# Patient Record
Sex: Male | Born: 1950
Health system: Southern US, Community
[De-identification: ages and names within clinical notes are randomized; demographics above are authoritative.]

## PROBLEM LIST (undated history)

## (undated) ENCOUNTER — Ambulatory Visit: Admission: EM | Payer: PPO | Source: Home / Self Care

## (undated) DIAGNOSIS — R319 Hematuria, unspecified: Secondary | ICD-10-CM

## (undated) DIAGNOSIS — Z87442 Personal history of urinary calculi: Secondary | ICD-10-CM

## (undated) DIAGNOSIS — H539 Unspecified visual disturbance: Secondary | ICD-10-CM

## (undated) DIAGNOSIS — I8290 Acute embolism and thrombosis of unspecified vein: Secondary | ICD-10-CM

## (undated) DIAGNOSIS — R351 Nocturia: Secondary | ICD-10-CM

## (undated) DIAGNOSIS — I219 Acute myocardial infarction, unspecified: Secondary | ICD-10-CM

## (undated) DIAGNOSIS — I10 Essential (primary) hypertension: Secondary | ICD-10-CM

## (undated) DIAGNOSIS — I714 Abdominal aortic aneurysm, without rupture, unspecified: Secondary | ICD-10-CM

## (undated) DIAGNOSIS — E119 Type 2 diabetes mellitus without complications: Secondary | ICD-10-CM

## (undated) DIAGNOSIS — Z951 Presence of aortocoronary bypass graft: Secondary | ICD-10-CM

## (undated) DIAGNOSIS — C61 Malignant neoplasm of prostate: Secondary | ICD-10-CM

## (undated) DIAGNOSIS — R3911 Hesitancy of micturition: Secondary | ICD-10-CM

## (undated) DIAGNOSIS — I872 Venous insufficiency (chronic) (peripheral): Secondary | ICD-10-CM

## (undated) DIAGNOSIS — T148XXA Other injury of unspecified body region, initial encounter: Secondary | ICD-10-CM

## (undated) DIAGNOSIS — E785 Hyperlipidemia, unspecified: Secondary | ICD-10-CM

## (undated) DIAGNOSIS — I2510 Atherosclerotic heart disease of native coronary artery without angina pectoris: Secondary | ICD-10-CM

## (undated) DIAGNOSIS — M1990 Unspecified osteoarthritis, unspecified site: Secondary | ICD-10-CM

## (undated) DIAGNOSIS — R739 Hyperglycemia, unspecified: Secondary | ICD-10-CM

## (undated) DIAGNOSIS — G629 Polyneuropathy, unspecified: Secondary | ICD-10-CM

## (undated) DIAGNOSIS — N189 Chronic kidney disease, unspecified: Secondary | ICD-10-CM

## (undated) DIAGNOSIS — E782 Mixed hyperlipidemia: Secondary | ICD-10-CM

## (undated) DIAGNOSIS — I639 Cerebral infarction, unspecified: Secondary | ICD-10-CM

## (undated) DIAGNOSIS — I213 ST elevation (STEMI) myocardial infarction of unspecified site: Secondary | ICD-10-CM

## (undated) HISTORY — DX: Acute embolism and thrombosis of unspecified vein: I82.90

## (undated) HISTORY — PX: URETERAL STENT PLACEMENT: SHX822

## (undated) HISTORY — DX: Cerebral infarction, unspecified: I63.9

## (undated) HISTORY — PX: KNEE SURGERY: SHX244

## (undated) HISTORY — DX: Polyneuropathy, unspecified: G62.9

## (undated) HISTORY — DX: Unspecified visual disturbance: H53.9

## (undated) HISTORY — DX: Type 2 diabetes mellitus without complications: E11.9

## (undated) HISTORY — DX: Chronic kidney disease, unspecified: N18.9

## (undated) HISTORY — PX: CORONARY ARTERY BYPASS GRAFT: SHX141

## (undated) HISTORY — PX: CATARACT EXTRACTION: SUR2

## (undated) HISTORY — DX: Malignant neoplasm of prostate: C61

## (undated) HISTORY — DX: Essential (primary) hypertension: I10

## (undated) HISTORY — DX: Unspecified osteoarthritis, unspecified site: M19.90

---

## 1980-12-25 HISTORY — PX: SHOULDER SURGERY: SHX246

## 1998-08-05 ENCOUNTER — Observation Stay (HOSPITAL_COMMUNITY): Admission: AD | Admit: 1998-08-05 | Discharge: 1998-08-06 | Payer: Self-pay | Admitting: Cardiology

## 2008-02-10 ENCOUNTER — Inpatient Hospital Stay (HOSPITAL_COMMUNITY): Admission: EM | Admit: 2008-02-10 | Discharge: 2008-02-16 | Payer: Self-pay | Admitting: Emergency Medicine

## 2008-02-10 DIAGNOSIS — I213 ST elevation (STEMI) myocardial infarction of unspecified site: Secondary | ICD-10-CM

## 2008-02-10 HISTORY — DX: ST elevation (STEMI) myocardial infarction of unspecified site: I21.3

## 2008-02-11 ENCOUNTER — Ambulatory Visit: Payer: Self-pay | Admitting: Thoracic Surgery (Cardiothoracic Vascular Surgery)

## 2008-03-16 ENCOUNTER — Ambulatory Visit (HOSPITAL_COMMUNITY)
Admission: RE | Admit: 2008-03-16 | Discharge: 2008-03-16 | Payer: Self-pay | Admitting: Thoracic Surgery (Cardiothoracic Vascular Surgery)

## 2008-03-16 ENCOUNTER — Ambulatory Visit: Payer: Self-pay | Admitting: Thoracic Surgery (Cardiothoracic Vascular Surgery)

## 2010-03-07 ENCOUNTER — Encounter: Admission: AD | Admit: 2010-03-07 | Discharge: 2010-03-07 | Payer: Self-pay | Admitting: Dentistry

## 2010-03-07 ENCOUNTER — Ambulatory Visit: Payer: Self-pay | Admitting: Dentistry

## 2010-03-15 HISTORY — PX: CARDIOVASCULAR STRESS TEST: SHX262

## 2010-03-24 ENCOUNTER — Ambulatory Visit (HOSPITAL_COMMUNITY): Admission: RE | Admit: 2010-03-24 | Discharge: 2010-03-24 | Payer: Self-pay | Admitting: Dentistry

## 2010-03-24 HISTORY — PX: OTHER SURGICAL HISTORY: SHX169

## 2010-05-02 ENCOUNTER — Ambulatory Visit: Payer: Self-pay | Admitting: Dentistry

## 2010-07-11 ENCOUNTER — Ambulatory Visit: Payer: Self-pay | Admitting: Dentistry

## 2010-08-10 ENCOUNTER — Emergency Department (HOSPITAL_COMMUNITY): Admission: EM | Admit: 2010-08-10 | Discharge: 2010-08-10 | Payer: Self-pay | Admitting: Family Medicine

## 2010-09-05 ENCOUNTER — Ambulatory Visit: Payer: Self-pay | Admitting: Dentistry

## 2011-01-15 ENCOUNTER — Encounter: Payer: Self-pay | Admitting: Thoracic Surgery (Cardiothoracic Vascular Surgery)

## 2011-03-20 LAB — CBC
HCT: 45.4 % (ref 39.0–52.0)
Hemoglobin: 15.9 g/dL (ref 13.0–17.0)
MCHC: 34.9 g/dL (ref 30.0–36.0)
RBC: 5.22 MIL/uL (ref 4.22–5.81)
RDW: 13 % (ref 11.5–15.5)

## 2011-03-20 LAB — BASIC METABOLIC PANEL
CO2: 26 mEq/L (ref 19–32)
Calcium: 9.3 mg/dL (ref 8.4–10.5)
GFR calc Af Amer: >60 mL/min (ref >60)
GFR calc non Af Amer: >60 mL/min (ref >60)
Glucose, Bld: 123 mg/dL — ABNORMAL HIGH (ref 70–99)
Potassium: 3.9 mEq/L (ref 3.5–5.1)
Sodium: 136 mEq/L (ref 135–145)

## 2011-05-09 NOTE — Assessment & Plan Note (Signed)
OFFICE VISIT   TORON, BOWRING  DOB:  July 17, 1951                                        March 16, 2008  CHART #:  [id number]   HISTORY OF PRESENT ILLNESS:  The patient is a 60 year old male who is  now approximately 1 month status post emergency coronary artery bypass  grafting x4.  This was performed for severe 3-vessel coronary artery  disease with an acute ST elevation myocardial infarction.  The patient  reports currently that he is making some steady progress.  He does have  significant sensitivity in the chest incision but no sternal instability  noted.  He is doing increasing walking, however, has not started the  rehabilitation program.  He also reports that he does have occasional  cigarette usage.  He is seen on today's date for postoperative office  visit with no specific complaints.   Chest x-ray is reviewed on today's date.  The wires are all intact.  He  shows no significant effusion or evidence of congestive heart failure.  There are no notable infiltrates.   PHYSICAL EXAMINATION:  Vital signs:  Blood pressure is 122/74, pulse 95  and regular, respirations 18 unlabored, oxygen saturation is 95% on room  air.  General Appearance:  Moderately obese middle-aged male in no acute  distress.  Incisions:  Incisions are all healing well without evidence  of infection.  There is no sternal instability or click.  Cardiac:  Regular rate and rhythm.  Normal S1 S2 without murmurs, gallops, or  rubs.  Pulmonary:  Clear breath sounds without wheezes or crackles.  Extremities:  Reveal trace edema.  Abdomen:  Obese, soft, nontender.   ASSESSMENT:  The patient is making steady progress.   He was seen by both myself and Dr. Cornelius Moras and counseled on his need to  discontinue smoking entirely.  He also reviewed cardiac risk  modifications and he appears to understand.  He understands how this  will potentially improve his health and prolong his life and not  modifying his lifestyle puts his longevity at risk due to his cardiac  history.  He is also encouraged to continue to be active but also to do  the cardiac rehabilitation phase 2 program.  He is encouraged to  continue to follow up with his cardiologist and family physician for  long term care.  From a cardiac surgical viewpoint, he can be seen on a  p.r.n. basis for any further surgical-related issues.   Rowe Clack, P.A.-C.   Sherryll Burger  D:  03/16/2008  T:  03/16/2008  Job:  [id number]   cc:   Cristy Hilts. Jacinto Halim, MD  Thereasa Solo Little, M.D.  Salvatore Decent. Cornelius Moras, M.D.

## 2011-05-09 NOTE — Consult Note (Signed)
NAMEHENDRYX, RICKE NO.:  [id number]   MEDICAL RECORD NO.:  [id number]          PATIENT TYPE:  INP   LOCATION:  1824                         FACILITY:  [facility]   PHYSICIAN:  Salvatore Decent. Cornelius Moras, M.D. DATE OF BIRTH:  25-Jun-1951   DATE OF CONSULTATION:  02/10/2008  DATE OF DISCHARGE:                                 CONSULTATION   REASON FOR CONSULTATION:  Severe three vessel coronary artery disease  with acute myocardial infarction.   HISTORY OF PRESENT ILLNESS:  The patient is a 60 year old white male  with known history of coronary artery disease who apparently underwent  cardiac catheterization several years ago by Dr. Caprice Kluver.  He has been  treated medically, and the details of his previous catheterization are  not currently available.  The patient has other risk factors including  hypertension, obesity, and longstanding tobacco abuse.  The patient  states that for the last two weeks or so has had off and on episodes of  substernal chest pain and epigastric pain.  At approximately 4 o'clock  this morning he was awoken from sleep with severe episode of unrelenting  substernal chest pain described as a gas like pressure.  The pain was  associated with severe diaphoresis and pain radiating to the left arm.  He was brought to the emergency room where baseline electrocardiogram  reveals acute ST segment elevation inferiorly consistent with acute  inferior wall myocardial infarction.  The patient was initially treated  with heparin, aspirin, Plavix, Integrilin, and morphine and brought  directly to the cath lab.  Cardiac catheterization performed by Dr.  Jacinto Halim demonstrates severe three vessel coronary artery disease with  acute occlusion of the right coronary artery with thrombus.  The patient  underwent percutaneous transluminal coronary angioplasty of the right  coronary artery with restoration of TIMI 3 antegrade flow through the  culprit vessel.  The patient was  noted to have severe residual three  vessel coronary artery disease including 95% stenosis of the left  anterior descending coronary artery and 80-90% stenosis of the large  circumflex marginal branch.  Intraaortic balloon pump was placed and a  temporary pacing wire was placed, although following angioplasty the  patient's hemodynamics entirely stabilized.  He was initially associated  with some hypotension and bradycardia which resolved following  angioplasty.  Emergency cardiac surgical consultation was requested.  Upon arrival the patient remained stable in the cath lab with no ongoing  symptoms of chest pain.   REVIEW OF SYSTEMS:  Review of Systems is documented in the patient's  chart.  The patient remains active physically.  He has been having chest  pain off and on for two weeks prior to this admission.  The remainder of  his Review of Systems is documented previously.   PAST MEDICAL HISTORY:  1. Coronary artery disease.  2. Hypertension.  3. Obesity.  4. Longstanding tobacco abuse.   PAST SURGICAL HISTORY:  Knee surgery.   SOCIAL HISTORY:  The patient single with three grown children two of  whom are present.  He smokes two packs of cigarettes per day and  drinks  occasional alcohol.  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   MEDICATIONS PRIOR TO ADMISSION:  None.   DRUG ALLERGIES:  None known.   FAMILY HISTORY:  Notable for a strong presence of coronary artery  disease in both parents.   PHYSICAL EXAMINATION:  GENERAL:  The patient is examined briefly in the  cath lab where he remains in normal sinus rhythm and normotensive, alert  and oriented.  HEENT:  Unrevealing.  NECK:  Supple.  There is no lymphadenopathy.  There are no carotid  bruits.  CHEST:  Auscultation of the chest reveals clear breath sounds  anteriorly.  No wheezes or rhonchi are noted.  CARDIOVASCULAR:  Demonstrates regular rate and rhythm.  No murmurs, rubs or gallops  are  noted.  ABDOMEN:  Obese but soft and nontender.  EXTREMITIES:  Warm and adequately perfused.  There is mild changes of  chronic venous insufficiency in both lower leg below the knee with a few  varicosities.  Distal pulses are palpable at the ankle.  Intraaortic  balloon pump is in place via the left common femoral approach, and a  sheath is in place in the right femoral artery approach with a venous  sheath and temporary pacing wire in place.  RECTAL/GU:  Rectal and GU exams are both deferred.  NEUROLOGIC:  Grossly nonfocal and symmetrical.   DIAGNOSTIC TESTS:  Cardiac catheterization performed by Dr. Jacinto Halim is  reviewed.  This demonstrates critical three vessel coronary artery  disease.  There is 95% proximal stenosis of the left anterior descending  coronary artery with diffusely diseased vessel.  There are two diagonal  branches.  There is 80-90% proximal stenosis of the large circumflex  marginal branch.  There is 100% proximal occlusion of the right coronary  artery which following angioplasty is the right dominant coronary  circulation with susceptible target vessels grafting.  Left ventricular  function appears acutely hypokinetic especially inferior wall and apex.  There is no mitral regurgitation.  Baseline left ventricular end  diastolic pressure was 34 mmHg.   IMPRESSION:  Severe three vessel coronary artery disease status post  acute inferior wall myocardial infarction now with stable hemodynamics  following successful angioplasty and restoration of TIMI 3 antegrade  flow.  I believe that Mr. Fagin would best be treated with prompt  surgical revascularization.   PLAN:  We plan to proceed directly to the operating room for emergency  coronary artery bypass grafting.  Mr. Withem and his family understand  and accept all potential associated risks of surgery including but not  limited to risk of death, stroke, myocardial infarction, congestive  heart failure,  respiratory failure, bleeding requiring blood  transfusion, arrhythmia, infection, recurrent coronary artery disease.  All their questions have been addressed.      Salvatore Decent. Cornelius Moras, M.D.  Electronically Signed     CHO/MEDQ  D:  02/10/2008  T:  02/11/2008  Job:  [id number]   cc:   Cristy Hilts. Jacinto Halim, MD  Thereasa Solo Little, M.D.

## 2011-05-09 NOTE — Discharge Summary (Signed)
NAMEWILBURT, MESSINA NO.:  [id number]   MEDICAL RECORD NO.:  [id number]          PATIENT TYPE:  INP   LOCATION:  2030                         FACILITY:  [facility]   PHYSICIAN:  Salvatore Decent. Cornelius Moras, M.D. DATE OF BIRTH:  11-22-1951   DATE OF ADMISSION:  02/10/2008  DATE OF DISCHARGE:  02/16/2008                               DISCHARGE SUMMARY   FINAL DIAGNOSIS:  Severe 3-vessel coronary artery disease with acute ST-  segment elevation myocardial infarction.   HOSPITAL DIAGNOSES:  Volume overload postoperatively.   SECONDARY DIAGNOSES:  1. Hypertension.  2. Obesity.  3. Longstanding tobacco abuse.  4. Status post knee surgery.   IN-HOSPITAL OPERATIONS AND PROCEDURES:  1. Cardiac catheterization emergently with left ventriculography,      selective right and left coronary angiogram.  Right and left      subclavian arteriogram with visualization of remote LIMA.  2. Temporary pacemaker implantation.  3. Intra-aortic balloon pump percutaneously.  4. Emergent coronary artery bypass grafting x4 using a left internal      mammary artery to distal left anterior descending coronary artery,      saphenous vein graft to circumflex marginal branch, saphenous vein      graft to first posterolateral branch with sequential saphenous vein      graft to posterior lateral branch, endoscopic saphenous vein      harvesting right thigh and right lower leg.   HISTORY AND PHYSICAL AND HOSPITAL COURSE:  The patient is a 60 year old  male with history of coronary artery disease, hypertension, longstanding  tobacco abuse and obesity.  The patient describes a 2-week history of  intermittent episodes of substernal chest pain.  On the morning of  February 10, 2008 he was awakened from sleep with the sudden onset of  similar right chest pain occurring at rest.  The pain persisted,  prompting him to present to the emergency department, where for his  diagnosis was an acute ST-segment elevation  myocardial infarction with  ST-segment elevation across the inferior leads on EKG.  The patient was  taken directly to the cardiac catheterization lab by Dr. Jacinto Halim, where  cardiac catheterization performed demonstrates severe 3-vessel coronary  artery disease and an acute occlusion of the right coronary artery.  The  right coronary resulted in using percutaneous transluminal angioplasty  and normal antegrade flow was established.  The patient was hypotensive  and bradycardic prior to this, requiring intra-aortic balloon placement  as well as placement of a temporary pacing lead.  After angioplasty, the  patient's hemodynamics stabilized.  He was left with 95% stenosis in the  left anterior descending coronary artery and 95% stenosis of the large  circumflex marginal branch.  Emergency cardiac surgical consultation was  requested.  The patient was seen and evaluated by Dr. Cornelius Moras.  Dr. Cornelius Moras  discussed with the patient's and his family undergoing emergency  coronary artery bypass grafting.  He discussed risks and benefits with  the patient.  The patient acknowledged understanding and agreed to  proceed.  Surgery was scheduled for February 10, 2008 emergently.  For  details of the  patient's past medical history and physical exam, please  see the dictated H&P.   The patient was taken emergently to the operating room on February 10, 2008, where he underwent emergency coronary artery bypass grafting x4  using a left internal mammary artery to the left anterior descending  coronary artery, saphenous vein graft to the circumflex marginal branch,  saphenous vein graft to the first posterolateral branch with sequential  saphenous vein graft to the posterolateral branch, endoscopic saphenous  vein harvest from right thigh and right lower leg.  The patient to able  to tolerate this procedure well and was transferred to the intensive  care unit in critical condition.  Anterior aortic balloon pump was   restarted..  The patient was noted to be hemodynamically stable  postoperatively and was able to be weaned and discontinued the morning  of postop day #1.  Post extubation the patient was noted to be alert and  oriented x4.  Neuro intact.  Intra-aortic balloon pump was able to be  discontinued on postop day #1 without any complications.  The patient  was noted to have good distal DP and PT pulses post removal of the drip.  Postoperatively the patient was noted to be in normal sinus rhythm.  He  remained on Neo and dopamine on postop day 1.  These were able to be  weaned and discontinued.  Heart rate and blood pressure tolerated well.  Swan-Ganz catheter was discontinue in normal fashion.  Low-dose beta  blocker was able to be started as well as low-dose ACE inhibitor.  After  initiating these 2 medications the patient did become hypotensive  overnight and lisinopril was placed on hold.  The patient tolerated  Lopressor well.  Blood pressure and heart rate stable.  Blood pressure  improved and he was able to be restarted on low-dose ACE inhibitor.  After the second tyr the patient's blood pressure maintained a systolic  greater than 100.  The patient remained in normal sinus rhythm during  the remainder of his postoperative course.  During this time the patient  remained hemodynamically stable.  Hemoglobin/hematocrit remained around  11 and 34%.  He had no symptoms from the mild anemia.  No blood  transfusions were required postoperatively.  The patient's pulmonary  status remained stable.  Chest x-ray done postop day #1 was clear.  The  patient had mild drainage from chest tubes and chest tubes were able to  be discontinued in normal fashion.  Repeat chest x-ray remained stable.  The patient was encouraged to use incentive spirometer.  He did require  O2 a longer period of time during hospitalization.  He was eventually  able to be weaned off oxygen and maintained sats greater than 90%.   On  the day prior to discharge the patient was up ambulating without  difficulty maintaining O2 sats.  The patient did have mild volume  overload postoperatively, requiring initiation of diuretics.  Daily  weights were obtained.  He was back near his baseline weight prior to  discharge home.  During this time the patient's creatinine was also  followed closely, remaining stable.  Postoperatively the patient was  ambulating well with cardiac rehab.  Prior to discharge home he was  ambulating without assistance.  He was tolerating diet well.  No nausea,  vomiting.   Postop day #6, February 16, 2008, the patient was noted to be afebrile.  In normal sinus rhythm.  Blood pressure stable.  Sats were greater than  97% on room air.  He was 2.6 kg below his preoperative weight.   LABS PRIOR TO DISCHARGE:  BMP done postop day #5 showed a sodium of 141,  potassium 4, chloride of 107, bicarb of 27, BUN of 18, creatinine 0.79,  glucose of 96.  The patient also had hemoglobin A1c done, showing 6.2.  Last CBC done prior to discharge was on February 14, 2008, showing a  white count of 12.1, hemoglobin of 11.8, hematocrit 35.1, platelet count  592.   The patient's pulmonary status was stable.  He was in normal sinus  rhythm.  Incisions were clean, dry and intact and healing well.  The  patient was felt to be stable and ready for discharge home February 16, 2008.   FOLLOW-UP APPOINTMENTS:  A follow-up appointment has been arranged with  Dr. Cornelius Moras for March 16, 2008 at 3 p.m..  The patient will need to obtain  PA and lateral chest x-ray 30 minutes prior to this appointment.  The  patient will need to follow up with Dr. Clarene Duke in 2 weeks.  He will need  to contact Dr. Fredirick Maudlin office to make these arrangements.   ACTIVITY:  Patient instructed no driving until released to do so, no  lifting over 10 pounds.  He is told to ambulate 3-4 times per day,  progress as tolerated, and continue his breathing  exercises.   INCISIONAL CARE:  The patient is told to shower, washing his incisions  using soap water.  Contact the office if he develops any drainage or  opening from any of his incision sites.   DIET:  The patient diet is to be low-fat, low-salt.   DISCHARGE MEDICATIONS:  1. Aspirin 81 mg daily.  2. Lopressor 25 mg b.i.d..  3. Lisinopril 5 mg daily.  4. Zocor 20 mg daily.  5. Plavix 75 mg daily.  6. Protonix 40 mg daily.  7. Oxycodone 5 mg 1-2 tablets q.4-6 hours p.r.n..      Theda Belfast, PA      [place] [facility]. Cornelius Moras, M.D.  Electronically Signed    KMD/MEDQ  D:  03/10/2008  T:  03/10/2008  Job:  [id number]   cc:   Clarene Duke, MD  Salvatore Decent. Cornelius Moras, M.D.

## 2011-05-09 NOTE — Op Note (Signed)
[name], [name] NO.:  [id number]   MEDICAL RECORD NO.:  [id number]          PATIENT TYPE:  INP   LOCATION:  2316                         FACILITY:  [facility]   PHYSICIAN:  [name], M.D. DATE OF BIRTH:  February 01, 1951   DATE OF PROCEDURE:  02/10/2008  DATE OF DISCHARGE:                               OPERATIVE REPORT   PREOPERATIVE DIAGNOSES:  Severe three-vessel coronary artery disease  with acute ST-segment elevation myocardial infarction.   POSTOPERATIVE DIAGNOSES:  Severe three-vessel coronary artery disease  with acute ST-segment elevation myocardial infarction.   PROCEDURE:  Emergency median sternotomy for coronary artery bypass  grafting x4 (left internal mammary artery to distal left anterior  descending coronary artery, saphenous vein graft to circumflex marginal  branch, saphenous vein graft to first posterolateral branch with  sequential saphenous vein graft to third posterolateral branch,  endoscopic saphenous vein harvest from right thigh and right lower leg).   SURGEON:  [name], M.D.   ASSISTANT:  Theda Belfast, PA   ANESTHESIA:  General general   BRIEF CLINICAL NOTE:  The patient is a 60 year old male with history of  coronary artery disease, hypertension, longstanding tobacco abuse and  obesity.  The patient describes a 2-week history of intermittent  episodes of substernal chest pain.  On the morning of February 16 he was  awakened from sleep with sudden onset of severe similar chest pain  occurring at rest.  Pain persisted, prompting him to present to the  emergency department where he was diagnosed with acute ST-segment  elevation myocardial infarction, with ST-segment elevation across the  inferior leads on electrocardiogram.  The patient was taken directly to  the cardiac catheterization lab by Dr. Cristy [name]. Ganji, where cardiac  catheterization demonstrates severe three-vessel coronary artery disease  and acute  occlusion of the right coronary artery.  The right coronary  artery was opened using percutaneous transluminal angioplasty and normal  antegrade flow was established.  The patient was hypotensive and  bradycardic prior to this, requiring intra-aortic balloon pump placement  as well as placement of a temporary pacing lead.  After angioplasty, the  patient's hemodynamics stabilized.  He was left with 95% stenosis in the  left anterior descending coronary artery, and  95% stenosis in the large  circumflex marginal branch.  Emergency cardiac surgical consultation was  requested, and the patient was now brought directly to the operating  room for emergency surgical revascularization.  The patient and his  family have been counseled regarding the indications, risks and  potential benefits of surgery.  The rationale for proceeding emergently  to the operating room has been discussed.  All of their questions have  been addressed.   OPERATIVE FINDINGS:  1. Mild to moderate left ventricular dysfunction, with severe inferior      wall hypokinesis.  2. Moderate left ventricular hypertrophy  3. Trace to mild mitral regurgitation is seen.  4. Good-quality left internal mammary artery and saphenous vein      conduit for grafting.  5. Diffuse coronary artery disease with severe plaque throughout most  vessels.  6. Diagonal branches off the left anterior descending coronary artery      are too diffusely diseased for grafting.   OPERATIVE NOTE IN DETAIL:  The patient was brought to the operating room  on the above-mentioned date, directly from the catheterization  lab; and  placed in the supine position on the operating room table.  Intravenous  antibiotics were administered.  Central venous monitoring was  established by the anesthesia service under the care and direction of  Dr. Hart [name].  Specifically, a Swan-Ganz catheter was placed  through the right internal jugular approach.  A  radial arterial line was  placed.  General endotracheal anesthesia was induced uneventfully.  Baseline transesophageal echocardiogram was performed by Dr. Gelene [name].  This confirms inferior wall hypokinesis.  There was mild to moderate  left ventricular hypertrophy.  There was trace to mild mitral  regurgitation.   The patient's chest, abdomen, both groins, and both lower extremities  were prepared and draped in sterile manner.  Median sternotomy incision  was performed, and the left internal mammary artery was dissected from  the chest wall and prepared for bypass grafting.  The left internal  mammary artery was good-quality conduit.  Simultaneously, the saphenous  vein was obtained from the patient's right thigh and the upper portion  of the right lower leg using endoscopic vein harvest technique.  The  saphenous vein was good-quality conduit.  After the saphenous vein had  been removed, the small incisions in the right lower extremity were  closed in multiple layers with running absorbable suture.  The patient  was heparinized systemically and the left internal mammary artery was  transected distally.  It was noted to have excellent flow.   The pericardium was opened.  The ascending aorta was normal in  appearance.  The ascending aorta and right atrium were cannulated for  cardiopulmonary bypass.  A retrograde cardioplegic catheter was placed  through the right atrium into the coronary sinus.  Cardiopulmonary  bypass was begun and the surface of the heart was inspected.  There was  diffuse coronary artery disease with hard palpable plaque throughout  most of the epicardial vessels.  Distal target vessels were selected for  grafting.  A temperature probe was placed in the left ventricular septum  and a cardioplegic catheter was placed in the ascending aorta.   The patient was cooled to 32 degrees systemic temperature.  The aortic  crossclamp was applied and cold blood cardioplegia  was delivered  initially in antegrade fashion through the aortic root.  Iced saline  slush was applied for topical hypothermia.  The initial cardioplegic  arrest and myocardial cooling was somewhat slow, and subsequently  retrograde cardioplegia was administered.  An excellent myocardial  cooling was achieved.  Repeat doses of cardioplegia were administered  intermittently throughout the crossclamp portion of the operation  through the aortic root, down subsequently placed vein grafts, and  retrograde through the coronary sinus catheter to maintain left  ventricular septal temperature below 15 degrees centigrade.   The following distal coronary anastomoses were performed:  1. The first posterolateral branch off the distal right coronary      artery was grafted with a saphenous vein graft in a side-to-side      fashion.  This vessel measures 1.5 mm in diameter and is grafted      just beyond its takeoff from the distal right coronary artery.      This was the first of the terminal branches  of the distal right      coronary artery; it is proximal to the posterior descending artery.  2. The third posterolateral branch off the distal right coronary      artery was grafted using a sequential saphenous vein graft off the      vein placed to the first posterolateral branch.  This vessel is the      third and terminal branch of the distal right coronary artery;      measures 1.4 mL at the site of distal bypass.  The posterior      descending coronary artery is the largest of the 3 terminal      branches of the distal right coronary artery.  This vessel is the      second branch but the entire vessel is hard calcified and diffusely      diseased throughout, and no acceptable target site for grafting is      appreciated in this vessel.  3. The circumflex marginal branch is grafted with a saphenous vein      graft in end-to-side fashion.  This vessel measured 1.4 mm in      diameter and is  diffusely diseased.  It is fair-quality target      vessel for grafting.  4. The distal left anterior descending coronary artery was grafted      with a left internal mammary artery in end-to-side fashion.  This      vessel was diffusely diseased and intramyocardial.  At the site of      distal bypass grafting it measures 1.9 mm in diameter and is a fair-      quality target vessel.  A 1.5 probe will easily pass both      directions.   Both proximal saphenous vein anastomoses were performed directly to the  ascending aorta prior to removal of the aortic crossclamp.  The left  ventricular septal temperature rises rapidly with reperfusion of the  left internal mammary artery.  One final dose of warm retrograde hot  shot cardioplegia is administered.  The aortic crossclamp was removed  after a total crossclamp time of 117 minutes.  The heart was  defibrillated and normal sinus rhythm resumes.  All proximal and distal  coronary anastomoses were inspected for hemostasis and appropriate graft  orientation.  Epicardial pacing wires were affixed to the right  ventricular outflow tract into the right atrium.  The patient was  rewarmed to 37 degrees centigrade temperature.  The patient was weaned  from cardiopulmonary bypass without difficulty.  The patient rhythm at  separation from bypass was normal sinus rhythm.  No inotropic support  was required.  Total cardiopulmonary bypass time for the operation is  138 minutes.   The venous and arterial cannulae are removed uneventfully.  Protamine  was administered to reverse the anticoagulation.  Follow-up  transesophageal echocardiogram performed by Dr. Arta Bruce after  separation from bypass demonstrates inferior wall hypokinesis.  There  was otherwise normal left ventricular function.  Ejection fraction was  estimated 45-50%.  There was trace to mild mitral regurgitation.   At this juncture some bleeding was noted at the distal right coronary   anastomosis.  This was corrected with a single suture using off-pump  stabilization to facilitate exposure and stabilization.  The mediastinum  and the left chest are drained with 3 chest tubes exited through  separate stab incisions inferiorly.  The pericardium and soft tissues  anterior to the aorta are reapproximated loosely.  The sternum was  closed with double-strength sternal wire.  The soft tissues anterior top  the sternum are closed in multiple layers, and the skin was closed with  a running subcuticular skin closure.   The patient tolerated the procedure well and was transported to the  surgical intensive care unit in stable condition.  There were no  intraoperative complications.  All sponge, instrument and needle counts  were verified correct at completion of the operation.  No blood products  were administered.      [name], M.D.  Electronically Signed     CHO/MEDQ  D:  02/10/2008  T:  02/11/2008  Job:  [id number]   cc:   [name]. Jacinto Halim, MD  Thereasa Solo Little, M.D.

## 2011-05-09 NOTE — Cardiovascular Report (Signed)
[name], [name] NO.:  [id number]   MEDICAL RECORD NO.:  [id number]          PATIENT TYPE:  INP   LOCATION:  2316                         FACILITY:  [facility]   PHYSICIAN:  Cristy Hilts. Jacinto Halim, MD       DATE OF BIRTH:  21-Nov-1951   DATE OF PROCEDURE:  02/10/2008  DATE OF DISCHARGE:                            CARDIAC CATHETERIZATION   PROCEDURE PERFORMED:  Emergent left heart catheterization including:  1. Left ventriculography.  2. Selective right and left coronary angiogram.  3. Right and left subclavian arteriogram and visualization of RIMA and      LIMA.  4. Temporary pacemaker implantation.  5. Placement of intra-aortic balloon pump percutaneously.   INDICATIONS:  Mr. Melo Stauber is a patient of Dr. Caprice Kluver who had  seen him in the past.  He is a smoker and morbidly obese.  He had been  having chest pain last night, and early this morning two hours prior to  the presentation to [facility], he had severe chest pain  associated with nausea and vomiting and diaphoresis.  In the emergency,  he was diagnosed with acute inferior, inferolateral wall ST elevation  myocardial infarction.  He was brought to the catheterization emergently  for coronary angiography.  The pacemaker was inserted because of  significant bradycardia, and balloon pump was placed for cardiogenic  shock and hypotension.   HEMODYNAMIC DATA:  Ventricular pressure was 130/24 with end-diastolic  pressure of 33 mmHg.  Aortic pressure was 180/90 with a mean of 114  mmHg.  There was no pressure gradient across the aortic valve.   ANGIOGRAPHIC DATA:  Left ventricle:  Left ventricular systolic function  was moderately depressed with ejection fraction of 40% with inferior,  inferolateral and inferoapical akinesis.  There was no significant  mitral regurgitation.   Right coronary artery:  Right coronary artery was a large-caliber vessel  and a dominant vessel.  It had diffuse luminal  irregularity and mild  ectasia.  It was completely occluded after the origin of a moderate size  RV branch.  Distally, the RCA gives origin to two small PDAs and a  larger PDA and a large PLV branch.  They had mild to moderate diffuse  disease.   Left main:  Left main was large vessel.  Smooth and normal.   Circumflex coronary artery:  Circumflex coronary artery again showed  mild luminal irregularities in the proximal segment.  Gave origin to a  large obtuse marginal, one which was a 80% stenosis followed by 99%  stenosis in the mid-to-distal segment.  The distal circumflex itself had  mild luminal irregularity.   LAD was a large vessel.  Proximal LAD had a focal 95% stenosis.  There  was mild to moderate amount of diffuse luminal irregularity in the  proximal to mid segment.  It gives origin to several small diagonals,  diagonal 2 being the largest has a 70% stenosis at the ostium and the  mid segment.   INTERVENTION DATA:  Successful PTCA and balloon angioplasty of the mid  RCA utilizing a 3.5 x 20  mm FireStar balloon performed at 8-10  atmospheres pressure for a minute.  Post balloon angioplasty TIMI 3 flow  was established with less than 30% residual stenosis.  The door to  balloon time was long and erroneous as I initially performed export  aspiration of the thrombus with establishment of TIMI 3 flow into the  right coronary artery.   RECOMMENDATIONS:  Based on his anatomy, he will need coronary artery  bypass grafting on emergent basis.  He has culprit vessel angioplasty  only.  He has been stabilized with intra-aortic balloon pump and  temporary pacemaker implantation, and Dr. Tressie Stalker has been  contacted, and he has agreed to take him for bypass surgery.   Right subclavian artery and right innominate artery and right internal  mammary artery were widely patent.   Left subclavian artery and left intramammary artery were widely patent.   TECHNIQUE OF THE PROCEDURE:   Under usual sterile precautions using a 7-  French right femoral arterial access, a 6-French multipurpose B2  catheter was advanced in the ascending aorta and then went into the left  ventricle.  Left ventriculography was performed in the RAO projection.  Catheter then pulled into the ascending aorta.  Right coronary artery  selectively engaged.  Angiography was performed and the left main  coronary artery selectively engaged.  Angiography was performed.   TECHNIQUE OF INTERVENTION:  Using heparin for anticoagulation  maintaining ACT greater than 250, a 7-French FR-4 guide was advanced to  engage the right coronary artery.  A 6-French venous access was also  obtained.  Because of episodes of hypotension and significant  bradycardia, a balloon-tip pacemaker was also introduced into the right  ventricle under fluoroscopic guidance.  Using a [employer]  guidewire, I was able to take the export catheter, and thrombectomy was  performed with aspiration.  I was able to establish TIMI 3 flow.  I also  gave 10 mL of intracoronary Integrilin.  Having performed this, I  performed a balloon angioplasty with a 3.5 x 20-mm FireStar balloon, and  at around 8-10 atmospheres pressure, and angiography was performed.  Excellent results were noted with less than 30% residual stenosis.  Though I did not feel like stenting this, as the LAD stenosis appeared  to be extremely complex and also had a high-grade what appeared like a  thrombotic 99% stenosis of the obtuse marginal vessel of the circumflex  coronary artery.  Hence, I felt coronary bypass grafting was probably  the best thing to do.  Then, I obtained left femoral arterial access  while waiting for the surgeon to call me back, and I was able to  introduce intra-aortic balloon pump without any complication.  Excellent  augmentation was obtained.  Having performed this, I was then able to  get in touch with Dr. Tressie Stalker who has accepted the  patient to take  him directly to the operating room for emergent bypass surgery.  I have  met the family and appraised them of the situation.      Cristy Hilts. Jacinto Halim, MD  Electronically Signed     JRG/MEDQ  D:  02/10/2008  T:  02/11/2008  Job:  [id number]   cc:   Thereasa Solo. Little, M.D.

## 2011-07-03 ENCOUNTER — Emergency Department (HOSPITAL_BASED_OUTPATIENT_CLINIC_OR_DEPARTMENT_OTHER)
Admission: EM | Admit: 2011-07-03 | Discharge: 2011-07-04 | Disposition: A | Discharge status: Home / Self Care | Payer: Self-pay | Attending: Emergency Medicine | Admitting: Emergency Medicine

## 2011-07-03 DIAGNOSIS — N433 Hydrocele, unspecified: Secondary | ICD-10-CM | POA: Insufficient documentation

## 2011-07-03 DIAGNOSIS — I219 Acute myocardial infarction, unspecified: Secondary | ICD-10-CM | POA: Insufficient documentation

## 2011-07-03 DIAGNOSIS — L0291 Cutaneous abscess, unspecified: Secondary | ICD-10-CM | POA: Insufficient documentation

## 2011-07-03 DIAGNOSIS — L0390 Cellulitis, unspecified: Secondary | ICD-10-CM | POA: Insufficient documentation

## 2011-07-03 DIAGNOSIS — R109 Unspecified abdominal pain: Secondary | ICD-10-CM | POA: Insufficient documentation

## 2011-07-03 DIAGNOSIS — E119 Type 2 diabetes mellitus without complications: Secondary | ICD-10-CM | POA: Insufficient documentation

## 2011-07-03 DIAGNOSIS — Z951 Presence of aortocoronary bypass graft: Secondary | ICD-10-CM | POA: Insufficient documentation

## 2011-07-03 DIAGNOSIS — I252 Old myocardial infarction: Secondary | ICD-10-CM | POA: Insufficient documentation

## 2011-07-03 HISTORY — DX: Acute myocardial infarction, unspecified: I21.9

## 2011-07-03 NOTE — ED Notes (Signed)
Left side groin pain after lifting 65-75 lb window

## 2011-07-04 MED ORDER — SULFAMETHOXAZOLE-TRIMETHOPRIM 800-160 MG PO TABS: 1.0000 | ORAL_TABLET | Freq: Two times a day (BID) | ORAL | Status: AC

## 2011-07-04 NOTE — ED Provider Notes (Signed)
History     Chief Complaint  Patient presents with  . Groin Pain   Patient is a 60 y.o. male presenting with groin pain. The history is provided by the patient.  Groin Pain This is a new problem. The current episode started 12 to 24 hours ago. The problem occurs constantly. The problem has not changed since onset.Pertinent negatives include no chest pain, no abdominal pain, no headaches and no shortness of breath. The symptoms are aggravated by nothing. The symptoms are relieved by nothing. He has tried nothing for the symptoms.  patient states that he was lifting a window and he felt a sharp pain in his left scrotum. No trauma. Some swelling. No difficutly urinating.   Past Medical History  Diagnosis Date  . Diabetes mellitus   . Myocardial infarction     Past Surgical History  Procedure Date  . Coronary artery bypass graft     History reviewed. No pertinent family history.  History  Substance Use Topics  . Smoking status: Current Everyday Smoker  . Smokeless tobacco: Not on file  . Alcohol Use: Yes      Review of Systems  Constitutional: Negative for chills.  Respiratory: Negative for shortness of breath.   Cardiovascular: Negative for chest pain.  Gastrointestinal: Negative for abdominal pain and abdominal distention.  Genitourinary: Positive for scrotal swelling. Negative for frequency, flank pain, difficulty urinating, genital sores, penile pain and testicular pain.  Musculoskeletal: Negative for back pain.  Neurological: Negative for headaches.    Physical Exam  BP 108/68  Pulse 64  Temp(Src) 97.7 F (36.5 C) (Oral)  Resp 16  Ht 5\' 11"  (1.803 m)  Wt 217 lb (98.431 kg)  BMI 30.27 kg/m2  Physical Exam  Constitutional: He appears well-developed.  HENT:  Head: Normocephalic.  Eyes: Pupils are equal, round, and reactive to light.  Pulmonary/Chest: Effort normal.  Abdominal: He exhibits no distension.  Genitourinary:       Some redness and edema of  scrotal skin. Single small pustule. Testicles nontender. Diffuse scrotal swelling. No hernias palpated.     ED Course  Procedures  MDM Scrotal swelling. Likely mild infection, but likely has hydrocele or vericocele. abx and urology follow up.       Juliet Rude. Rubin Payor, MD 07/04/11 1610

## 2011-07-04 NOTE — Discharge Instructions (Signed)
Cellulitis Cellulitis is an infection of the skin and the tissue beneath it. The area is typically red and tender. It is caused by germs (bacteria) (usually staph or strep) that enter the body through cuts or sores. Cellulitis most commonly occurs in the arms or lower legs.  HOME CARE INSTRUCTIONS  If you are given a prescription for medications which kill germs (antibiotics), take as directed until finished.   If the infection is on the arm or leg, keep the limb elevated as able.   Use a warm cloth several times per day to relieve pain and encourage healing.   See your caregiver for recheck of the infected site in 1 day or sooner if problems arise.   Only take over-the-counter or prescription medicines for pain, discomfort, or fever as directed by your caregiver.  SEEK MEDICAL CARE IF:  An oral temperature above 102 develops, not controlled by medication.   The area of redness (inflammation) is spreading, there are red streaks coming from the infected site, or if a part of the infection begins to turn dark in color.   The joint or bone underneath the infected skin becomes painful after the skin has healed.   The infection returns in the same or another area after it seems to have gone away.   A boil or bump swells up. This may be an abscess.   New, unexplained problems such as pain or fever develop.  SEEK IMMEDIATE MEDICAL CARE IF:  You or your child feels drowsy or lethargic.   There is vomiting, diarrhea, or lasting discomfort or feeling ill (malaise) with muscle aches and pains.  MAKE SURE YOU:   Understand these instructions.   Will watch your condition.   Will get help right away if you are not doing well or get worse.  Document Released: 09/20/2005 Document Re-Released: 10/08/2009

## 2011-07-17 DIAGNOSIS — K08109 Complete loss of teeth, unspecified cause, unspecified class: Secondary | ICD-10-CM

## 2011-07-17 DIAGNOSIS — K1370 Unspecified lesions of oral mucosa: Secondary | ICD-10-CM

## 2011-08-18 DIAGNOSIS — C61 Malignant neoplasm of prostate: Secondary | ICD-10-CM

## 2011-08-18 HISTORY — DX: Malignant neoplasm of prostate: C61

## 2011-08-24 ENCOUNTER — Other Ambulatory Visit: Payer: Self-pay | Admitting: Urology

## 2011-08-24 DIAGNOSIS — C61 Malignant neoplasm of prostate: Secondary | ICD-10-CM

## 2011-09-07 ENCOUNTER — Ambulatory Visit (HOSPITAL_COMMUNITY)
Admission: RE | Admit: 2011-09-07 | Discharge: 2011-09-07 | Disposition: A | Discharge status: Home / Self Care | Payer: Self-pay | Source: Ambulatory Visit | Attending: Urology | Admitting: Urology

## 2011-09-07 DIAGNOSIS — C61 Malignant neoplasm of prostate: Secondary | ICD-10-CM | POA: Insufficient documentation

## 2011-09-07 LAB — CREATININE, SERUM
Creatinine, Ser: 0.7 mg/dL (ref 0.50–1.35)
GFR calc non Af Amer: >60 mL/min (ref >60)

## 2011-09-12 ENCOUNTER — Ambulatory Visit (HOSPITAL_COMMUNITY)
Admission: RE | Admit: 2011-09-12 | Discharge: 2011-09-12 | Disposition: A | Discharge status: Home / Self Care | Payer: Medicaid Other | Source: Ambulatory Visit | Attending: Urology | Admitting: Urology

## 2011-09-12 ENCOUNTER — Encounter (HOSPITAL_COMMUNITY)
Admission: RE | Admit: 2011-09-12 | Discharge: 2011-09-12 | Disposition: A | Discharge status: Home / Self Care | Payer: Medicaid Other | Source: Ambulatory Visit | Attending: Urology | Admitting: Urology

## 2011-09-12 DIAGNOSIS — I700 Atherosclerosis of aorta: Secondary | ICD-10-CM | POA: Insufficient documentation

## 2011-09-12 DIAGNOSIS — C61 Malignant neoplasm of prostate: Secondary | ICD-10-CM

## 2011-09-12 DIAGNOSIS — K5730 Diverticulosis of large intestine without perforation or abscess without bleeding: Secondary | ICD-10-CM | POA: Insufficient documentation

## 2011-09-12 LAB — POCT I-STAT, CHEM 8
Chloride: 100 mEq/L (ref 96–112)
HCT: 51 % (ref 39.0–52.0)
Hemoglobin: 17.3 g/dL — ABNORMAL HIGH (ref 13.0–17.0)
Potassium: 4.6 mEq/L (ref 3.5–5.1)
Sodium: 138 mEq/L (ref 135–145)

## 2011-09-12 MED ORDER — TECHNETIUM TC 99M MEDRONATE IV KIT
25.0000 | PACK | Freq: Once | INTRAVENOUS | Status: AC | PRN
  Administered 2011-09-12: 25 via INTRAVENOUS

## 2011-09-12 MED ORDER — IOHEXOL 300 MG/ML  SOLN
125.0000 mL | Freq: Once | INTRAMUSCULAR | Status: AC | PRN
  Administered 2011-09-12: 125 mL via INTRAVENOUS

## 2011-09-15 LAB — POCT I-STAT 4, (NA,K, GLUC, HGB,HCT)
Glucose, Bld: 113 — ABNORMAL HIGH
Glucose, Bld: 122 — ABNORMAL HIGH
Glucose, Bld: 123 — ABNORMAL HIGH
Glucose, Bld: 132 — ABNORMAL HIGH
Glucose, Bld: 140 — ABNORMAL HIGH
HCT: 29 — ABNORMAL LOW
HCT: 29 — ABNORMAL LOW
HCT: 33 — ABNORMAL LOW
HCT: 33 — ABNORMAL LOW
HCT: 47
Hemoglobin: 11.2 — ABNORMAL LOW
Hemoglobin: 11.2 — ABNORMAL LOW
Hemoglobin: 13.9
Hemoglobin: 16
Operator id: 3291
Operator id: 3291
Operator id: 3291
Potassium: 4.6
Potassium: 5.3 — ABNORMAL HIGH
Potassium: 6.3
Potassium: 6.8
Sodium: 133 — ABNORMAL LOW
Sodium: 134 — ABNORMAL LOW
Sodium: 139
Sodium: 140

## 2011-09-15 LAB — I-STAT EC8
BUN: 8
Bicarbonate: 21.1
Chloride: 112
Glucose, Bld: 135 — ABNORMAL HIGH
Glucose, Bld: 137 — ABNORMAL HIGH
HCT: 38 — ABNORMAL LOW
Potassium: 3.7
TCO2: 27
pCO2 arterial: 36.6
pCO2 arterial: 38.3
pH, Arterial: 7.434

## 2011-09-15 LAB — APTT
aPTT: 122 — ABNORMAL HIGH
aPTT: 29
aPTT: 36

## 2011-09-15 LAB — POCT I-STAT 3, ART BLOOD GAS (G3+)
Acid-base deficit: 1
Acid-base deficit: 3 — ABNORMAL HIGH
Acid-base deficit: 3 — ABNORMAL HIGH
Bicarbonate: 21.6
Bicarbonate: 24.8 — ABNORMAL HIGH
O2 Saturation: 100
O2 Saturation: 88
Operator id: 113031
Operator id: 128731
Operator id: 256891
Operator id: 257021
Patient temperature: 36.3
Patient temperature: 36.8
Patient temperature: 37.5
Patient temperature: 37.8
Patient temperature: 38
TCO2: 23
TCO2: 25
pCO2 arterial: 32.4 — ABNORMAL LOW
pCO2 arterial: 49.5 — ABNORMAL HIGH
pH, Arterial: 7.271 — ABNORMAL LOW
pH, Arterial: 7.444
pH, Arterial: 7.459 — ABNORMAL HIGH
pO2, Arterial: 89

## 2011-09-15 LAB — TROPONIN I: Troponin I: 17.03

## 2011-09-15 LAB — I-STAT 8, (EC8 V) (CONVERTED LAB)
BUN: 13
Chloride: 102
pCO2, Ven: 37.9 — ABNORMAL LOW
pH, Ven: 7.455 — ABNORMAL HIGH

## 2011-09-15 LAB — BLOOD GAS, ARTERIAL
Bicarbonate: 21
TCO2: 22.4
pCO2 arterial: 46.8 — ABNORMAL HIGH
pH, Arterial: 7.273 — ABNORMAL LOW
pO2, Arterial: 137 — ABNORMAL HIGH

## 2011-09-15 LAB — BASIC METABOLIC PANEL
BUN: 13
BUN: 18
CO2: 22
CO2: 24
CO2: 26
CO2: 27
Calcium: 8.3 — ABNORMAL LOW
Calcium: 8.4
Calcium: 8.6
Chloride: 103
Chloride: 114 — ABNORMAL HIGH
Creatinine, Ser: 0.69
Creatinine, Ser: 0.79
GFR calc Af Amer: >60
GFR calc Af Amer: >60
GFR calc non Af Amer: >60
GFR calc non Af Amer: >60
Glucose, Bld: 103 — ABNORMAL HIGH
Glucose, Bld: 171 — ABNORMAL HIGH
Potassium: 3.8
Potassium: 3.9
Potassium: 4
Potassium: 4.1
Sodium: 136
Sodium: 140
Sodium: 142

## 2011-09-15 LAB — DIFFERENTIAL
Eosinophils Relative: 2
Lymphocytes Relative: 39
Lymphs Abs: 5 — ABNORMAL HIGH
Monocytes Absolute: 1.1 — ABNORMAL HIGH
Monocytes Relative: 9
Neutro Abs: 6.3

## 2011-09-15 LAB — CREATININE, SERUM
Creatinine, Ser: 0.74
Creatinine, Ser: 0.75
GFR calc Af Amer: >60
GFR calc Af Amer: >60
GFR calc non Af Amer: >60
GFR calc non Af Amer: >60

## 2011-09-15 LAB — COMPREHENSIVE METABOLIC PANEL
ALT: 36
AST: 121 — ABNORMAL HIGH
Alkaline Phosphatase: 94
GFR calc Af Amer: >60
Glucose, Bld: 186 — ABNORMAL HIGH
Potassium: 4.2
Sodium: 134 — ABNORMAL LOW
Total Protein: 6.2

## 2011-09-15 LAB — CROSSMATCH

## 2011-09-15 LAB — CBC
HCT: 34.4 — ABNORMAL LOW
HCT: 37.3 — ABNORMAL LOW
HCT: 37.8 — ABNORMAL LOW
HCT: 39.5
HCT: 49.6
Hemoglobin: 11.8 — ABNORMAL LOW
Hemoglobin: 11.8 — ABNORMAL LOW
Hemoglobin: 13.1
Hemoglobin: 13.4
Hemoglobin: 15.7
Hemoglobin: 17.1 — ABNORMAL HIGH
MCHC: 33.6
MCHC: 33.9
MCHC: 34
MCHC: 34.2
MCHC: 34.5
MCV: 87.9
MCV: 88.2
MCV: 88.3
Platelets: 142 — ABNORMAL LOW
Platelets: 163
Platelets: 197
Platelets: 204
RBC: 3.91 — ABNORMAL LOW
RBC: 4.23
RBC: 4.32
RBC: 5.24
RBC: 5.6
RDW: 13.7
RDW: 13.7
RDW: 13.9
RDW: 14.1
RDW: 14.1
WBC: 22.4 — ABNORMAL HIGH
WBC: 26.1 — ABNORMAL HIGH

## 2011-09-15 LAB — PROTIME-INR
INR: 1.1
INR: 1.2
INR: 1.3
Prothrombin Time: 15.4 — ABNORMAL HIGH

## 2011-09-15 LAB — POCT I-STAT 3, VENOUS BLOOD GAS (G3P V)
Acid-base deficit: 5 — ABNORMAL HIGH
Bicarbonate: 22.9
Operator id: 3291
TCO2: 24

## 2011-09-15 LAB — LIPID PANEL
LDL Cholesterol: 94
Total CHOL/HDL Ratio: 6.3
Triglycerides: 114
VLDL: 23

## 2011-09-15 LAB — HEPATIC FUNCTION PANEL
Albumin: 2.7 — ABNORMAL LOW
Total Bilirubin: 0.8

## 2011-09-15 LAB — HEMOGLOBIN A1C
Hgb A1c MFr Bld: 6.2 — ABNORMAL HIGH
Mean Plasma Glucose: 143

## 2011-09-15 LAB — CK TOTAL AND CKMB (NOT AT ARMC): CK, MB: 130.6 — ABNORMAL HIGH

## 2011-09-20 ENCOUNTER — Ambulatory Visit
Admission: RE | Admit: 2011-09-20 | Discharge: 2011-09-20 | Disposition: A | Discharge status: Home / Self Care | Payer: Medicaid Other | Source: Ambulatory Visit | Attending: Radiation Oncology | Admitting: Radiation Oncology

## 2011-09-20 DIAGNOSIS — C61 Malignant neoplasm of prostate: Secondary | ICD-10-CM | POA: Insufficient documentation

## 2011-09-20 DIAGNOSIS — Z7982 Long term (current) use of aspirin: Secondary | ICD-10-CM | POA: Insufficient documentation

## 2011-09-20 DIAGNOSIS — Z510 Encounter for antineoplastic radiation therapy: Secondary | ICD-10-CM | POA: Insufficient documentation

## 2011-09-20 DIAGNOSIS — I2510 Atherosclerotic heart disease of native coronary artery without angina pectoris: Secondary | ICD-10-CM | POA: Insufficient documentation

## 2011-09-20 DIAGNOSIS — Z79899 Other long term (current) drug therapy: Secondary | ICD-10-CM | POA: Insufficient documentation

## 2011-09-20 DIAGNOSIS — I10 Essential (primary) hypertension: Secondary | ICD-10-CM | POA: Insufficient documentation

## 2011-12-01 ENCOUNTER — Encounter: Payer: Self-pay | Admitting: Radiation Oncology

## 2011-12-01 ENCOUNTER — Ambulatory Visit: Payer: Medicaid Other

## 2011-12-01 ENCOUNTER — Ambulatory Visit
Admission: RE | Admit: 2011-12-01 | Discharge: 2011-12-01 | Disposition: A | Discharge status: Home / Self Care | Payer: Medicaid Other | Source: Ambulatory Visit | Attending: Radiation Oncology | Admitting: Radiation Oncology

## 2011-12-06 ENCOUNTER — Encounter: Payer: Self-pay | Admitting: *Deleted

## 2011-12-06 NOTE — Progress Notes (Signed)
IPPIS results from 09/20/11= [id number] score=21

## 2011-12-08 ENCOUNTER — Encounter: Payer: Self-pay | Admitting: Radiation Oncology

## 2011-12-08 ENCOUNTER — Ambulatory Visit
Admission: RE | Admit: 2011-12-08 | Discharge: 2011-12-08 | Disposition: A | Discharge status: Home / Self Care | Payer: Medicaid Other | Source: Ambulatory Visit | Attending: Radiation Oncology | Admitting: Radiation Oncology

## 2011-12-11 ENCOUNTER — Ambulatory Visit
Admission: RE | Admit: 2011-12-11 | Discharge: 2011-12-11 | Disposition: A | Discharge status: Home / Self Care | Payer: Medicaid Other | Source: Ambulatory Visit | Attending: Radiation Oncology | Admitting: Radiation Oncology

## 2011-12-11 ENCOUNTER — Encounter: Payer: Self-pay | Admitting: *Deleted

## 2011-12-11 DIAGNOSIS — C61 Malignant neoplasm of prostate: Secondary | ICD-10-CM

## 2011-12-11 NOTE — Progress Notes (Signed)
Post sim done, gave pt "Radiation and You" booklet, all questions answered

## 2011-12-12 ENCOUNTER — Ambulatory Visit
Admission: RE | Admit: 2011-12-12 | Discharge: 2011-12-12 | Disposition: A | Discharge status: Home / Self Care | Payer: Medicaid Other | Source: Ambulatory Visit | Attending: Radiation Oncology | Admitting: Radiation Oncology

## 2011-12-13 ENCOUNTER — Ambulatory Visit
Admission: RE | Admit: 2011-12-13 | Discharge: 2011-12-13 | Disposition: A | Discharge status: Home / Self Care | Payer: Medicaid Other | Source: Ambulatory Visit | Attending: Radiation Oncology | Admitting: Radiation Oncology

## 2011-12-14 ENCOUNTER — Ambulatory Visit
Admission: RE | Admit: 2011-12-14 | Discharge: 2011-12-14 | Disposition: A | Discharge status: Home / Self Care | Payer: Medicaid Other | Source: Ambulatory Visit | Attending: Radiation Oncology | Admitting: Radiation Oncology

## 2011-12-15 ENCOUNTER — Ambulatory Visit
Admission: RE | Admit: 2011-12-15 | Discharge: 2011-12-15 | Disposition: A | Discharge status: Home / Self Care | Payer: Medicaid Other | Source: Ambulatory Visit | Attending: Radiation Oncology | Admitting: Radiation Oncology

## 2011-12-15 VITALS — Wt 220.2 lb

## 2011-12-15 DIAGNOSIS — C61 Malignant neoplasm of prostate: Secondary | ICD-10-CM

## 2011-12-15 NOTE — Progress Notes (Signed)
Pt has no c/o today 

## 2011-12-15 NOTE — Progress Notes (Signed)
[facility] Radiation Oncology Weekly Treatment Note    Name: [name] Date: 12/15/2011 MRN: [id number] DOB: 07/11/1951  Status: outpatient    Current dose: 900  Current fraction:5  Planned dose:4500  Planned fraction:25   MEDICATIONS: Current Outpatient Prescriptions  Medication Sig Dispense Refill  . aspirin 81 MG EC tablet Take 162 mg by mouth daily.        Marland Kitchen ibuprofen (ADVIL,MOTRIN) 200 MG tablet Take 200 mg by mouth as needed. For pain       . lisinopril-hydrochlorothiazide (PRINZIDE,ZESTORETIC) 10-12.5 MG per tablet Take 1 tablet by mouth daily.        . metoprolol tartrate (LOPRESSOR) 25 MG tablet Take 25 mg by mouth 2 (two) times daily.        . pravastatin (PRAVACHOL) 40 MG tablet Take 80 mg by mouth daily.        . Omega-3 Fatty Acids (FISH OIL) 1000 MG CAPS Take 1 capsule by mouth daily.           ALLERGIES: Review of patient's allergies indicates no known allergies.   LABORATORY DATA:  Lab Results  Component Value Date   WBC 11.4* 03/17/2010   HGB 17.3* 09/12/2011   HCT 51.0 09/12/2011   MCV 87.1 03/17/2010   PLT 247 03/17/2010   Lab Results  Component Value Date   NA 138 09/12/2011   K 4.6 09/12/2011   CL 100 09/12/2011   CO2 26 03/17/2010   Lab Results  Component Value Date   ALT 20 02/14/2008   AST 25 02/14/2008   ALKPHOS 49 02/14/2008   BILITOT 0.8 02/14/2008      NARRATIVE: [name] was seen today for weekly treatment management. The chart was checked and CBCT images were reviewed. Pt doing excellent. No problems so far.  PHYSICAL EXAMINATION: weight is 220 lb 3.2 oz (99.882 kg).     ASSESSMENT: Patient tolerating treatments well.   PLAN: Continue treatment as planned.

## 2011-12-18 ENCOUNTER — Ambulatory Visit
Admission: RE | Admit: 2011-12-18 | Discharge: 2011-12-18 | Disposition: A | Discharge status: Home / Self Care | Payer: Medicaid Other | Source: Ambulatory Visit | Attending: Radiation Oncology | Admitting: Radiation Oncology

## 2011-12-20 ENCOUNTER — Ambulatory Visit
Admission: RE | Admit: 2011-12-20 | Discharge: 2011-12-20 | Disposition: A | Discharge status: Home / Self Care | Payer: Medicaid Other | Source: Ambulatory Visit | Attending: Radiation Oncology | Admitting: Radiation Oncology

## 2011-12-20 DIAGNOSIS — C61 Malignant neoplasm of prostate: Secondary | ICD-10-CM | POA: Insufficient documentation

## 2011-12-21 ENCOUNTER — Ambulatory Visit
Admission: RE | Admit: 2011-12-21 | Discharge: 2011-12-21 | Disposition: A | Discharge status: Home / Self Care | Payer: Medicaid Other | Source: Ambulatory Visit | Attending: Radiation Oncology | Admitting: Radiation Oncology

## 2011-12-22 ENCOUNTER — Ambulatory Visit
Admission: RE | Admit: 2011-12-22 | Discharge: 2011-12-22 | Disposition: A | Discharge status: Home / Self Care | Payer: Medicaid Other | Source: Ambulatory Visit | Attending: Radiation Oncology | Admitting: Radiation Oncology

## 2011-12-22 ENCOUNTER — Encounter: Payer: Self-pay | Admitting: Radiation Oncology

## 2011-12-22 VITALS — Wt 220.0 lb

## 2011-12-22 DIAGNOSIS — C61 Malignant neoplasm of prostate: Secondary | ICD-10-CM

## 2011-12-22 NOTE — Progress Notes (Signed)
  Radiation Oncology         [phone] ________________________________  Name: [name] MRN: [id number]  Date: 12/22/2011  DOB: 28-Aug-1951  Weekly Radiation Therapy Management  Current Dose: 16.2 Gy     Planned Dose:  45 Gy plus seeds  Narrative . . . . . . . . The patient presents for routine under treatment assessment.                                              The patient is without complaint.                                 Set-up films were reviewed.                                 The chart was checked. Physical Findings. . . Weight essentially stable.  No significant changes. Impression . . . . . . . The patient is  tolerating radiation. Plan . . . . . . . . . . . . Continue treatment as planned.  ________________________________  Artist Pais. Kathrynn Running, M.D.

## 2011-12-22 NOTE — Progress Notes (Signed)
No c/o today 

## 2011-12-25 ENCOUNTER — Ambulatory Visit: Payer: Medicaid Other

## 2011-12-25 ENCOUNTER — Ambulatory Visit
Admission: RE | Admit: 2011-12-25 | Discharge: 2011-12-25 | Disposition: A | Discharge status: Home / Self Care | Payer: Medicaid Other | Source: Ambulatory Visit | Attending: Radiation Oncology | Admitting: Radiation Oncology

## 2011-12-26 HISTORY — PX: EXTRACORPOREAL SHOCK WAVE LITHOTRIPSY: SHX1557

## 2011-12-27 ENCOUNTER — Ambulatory Visit
Admission: RE | Admit: 2011-12-27 | Discharge: 2011-12-27 | Disposition: A | Discharge status: Home / Self Care | Payer: Medicaid Other | Source: Ambulatory Visit | Attending: Radiation Oncology | Admitting: Radiation Oncology

## 2011-12-28 ENCOUNTER — Ambulatory Visit
Admission: RE | Admit: 2011-12-28 | Discharge: 2011-12-28 | Disposition: A | Discharge status: Home / Self Care | Payer: Medicaid Other | Source: Ambulatory Visit | Attending: Radiation Oncology | Admitting: Radiation Oncology

## 2011-12-28 ENCOUNTER — Other Ambulatory Visit: Payer: Self-pay | Admitting: Urology

## 2011-12-29 ENCOUNTER — Ambulatory Visit
Admission: RE | Admit: 2011-12-29 | Discharge: 2011-12-29 | Disposition: A | Discharge status: Home / Self Care | Payer: Medicaid Other | Source: Ambulatory Visit | Attending: Radiation Oncology | Admitting: Radiation Oncology

## 2011-12-29 ENCOUNTER — Ambulatory Visit
Admission: RE | Admit: 2011-12-29 | Discharge: 2011-12-29 | Disposition: A | Discharge status: Home / Self Care | Payer: Medicaid Other | Source: Ambulatory Visit | Attending: Anesthesiology | Admitting: Anesthesiology

## 2011-12-29 ENCOUNTER — Encounter (HOSPITAL_BASED_OUTPATIENT_CLINIC_OR_DEPARTMENT_OTHER)
Admission: RE | Admit: 2011-12-29 | Discharge: 2011-12-29 | Disposition: A | Discharge status: Home / Self Care | Payer: Medicaid Other | Source: Ambulatory Visit | Attending: Urology | Admitting: Urology

## 2011-12-29 ENCOUNTER — Encounter: Payer: Self-pay | Admitting: Radiation Oncology

## 2011-12-29 ENCOUNTER — Other Ambulatory Visit: Payer: Self-pay

## 2011-12-29 VITALS — Wt 222.5 lb

## 2011-12-29 DIAGNOSIS — R9431 Abnormal electrocardiogram [ECG] [EKG]: Secondary | ICD-10-CM | POA: Insufficient documentation

## 2011-12-29 DIAGNOSIS — Z01810 Encounter for preprocedural cardiovascular examination: Secondary | ICD-10-CM | POA: Insufficient documentation

## 2011-12-29 DIAGNOSIS — C61 Malignant neoplasm of prostate: Secondary | ICD-10-CM | POA: Insufficient documentation

## 2011-12-29 NOTE — Progress Notes (Signed)
Pt has had imodium this week,took mothers pill,stopped the diarrhea, now pt has urgency to go to bathroom, then just trickles voiding"pt feels like he's about to die before he gets to bathroom", drinking plenty water stated, no dysuria stated 2:34 PM

## 2011-12-29 NOTE — Progress Notes (Signed)
  Radiation Oncology         [phone] ________________________________  Name: [name] MRN: [id number]  Date: 12/29/2011  DOB: 03/16/1951  Weekly Radiation Therapy Management  Current Dose: 21.6 Gy     Planned Dose:  45 Gy plus seed implant  Narrative . . . . . . . . The patient presents for routine under treatment assessment.                                                     The patient is without complaint.  He has outflow obstructive symptoms in the morning.  Diarrhea OK.                                 Set-up films were reviewed.                                 The chart was checked. Physical Findings. . . Weight essentially stable.  No significant changes. Impression . . . . . . . The patient is  tolerating radiation. Plan . . . . . . . . . . . . Continue treatment as planned.  ________________________________  Artist Pais. Kathrynn Running, M.D.

## 2012-01-01 ENCOUNTER — Ambulatory Visit
Admission: RE | Admit: 2012-01-01 | Discharge: 2012-01-01 | Disposition: A | Discharge status: Home / Self Care | Payer: Medicaid Other | Source: Ambulatory Visit | Attending: Radiation Oncology | Admitting: Radiation Oncology

## 2012-01-02 ENCOUNTER — Ambulatory Visit
Admission: RE | Admit: 2012-01-02 | Discharge: 2012-01-02 | Disposition: A | Discharge status: Home / Self Care | Payer: Medicaid Other | Source: Ambulatory Visit | Attending: Radiation Oncology | Admitting: Radiation Oncology

## 2012-01-03 ENCOUNTER — Ambulatory Visit
Admission: RE | Admit: 2012-01-03 | Discharge: 2012-01-03 | Disposition: A | Discharge status: Home / Self Care | Payer: Medicaid Other | Source: Ambulatory Visit | Attending: Radiation Oncology | Admitting: Radiation Oncology

## 2012-01-04 ENCOUNTER — Encounter: Payer: Self-pay | Admitting: Radiation Oncology

## 2012-01-04 ENCOUNTER — Ambulatory Visit
Admission: RE | Admit: 2012-01-04 | Discharge: 2012-01-04 | Disposition: A | Discharge status: Home / Self Care | Payer: Medicaid Other | Source: Ambulatory Visit | Attending: Radiation Oncology | Admitting: Radiation Oncology

## 2012-01-04 ENCOUNTER — Telehealth: Payer: Self-pay | Admitting: Radiation Oncology

## 2012-01-04 DIAGNOSIS — C61 Malignant neoplasm of prostate: Secondary | ICD-10-CM

## 2012-01-04 MED ORDER — TAMSULOSIN HCL 0.4 MG PO CAPS: 0.4000 mg | ORAL_CAPSULE | Freq: Every day | ORAL | Status: DC

## 2012-01-04 NOTE — Progress Notes (Signed)
  Radiation Oncology         [phone] ________________________________  Name: [name] MRN: [id number]  Date: 01/04/2012  DOB: [birth year]  Weekly Radiation Therapy Management  Current Dose: 30.6 Gy     Planned Dose:  45 Gy plus seed implant  Narrative . . . . . . . . The patient presents for routine under treatment assessment.                                                      The patient is without complaint.                                 Set-up films were reviewed.                                 The chart was checked. Physical Findings. . . Weight essentially stable.  No significant changes. Impression . . . . . . . The patient is  tolerating radiation. Plan . . . . . . . . . . . . Continue treatment as planned. Given Flomax  ________________________________  Artist Pais Kathrynn Running, M.D.

## 2012-01-04 NOTE — Progress Notes (Signed)
No dysuria, has increased, urgency but trickles urine out , bowel movements back to normal,  3:07 PM

## 2012-01-04 NOTE — Telephone Encounter (Signed)
MDC - MAD is with [place] [facility].   MID: [id number] P APPRVD: 09/25/2011 - 09/23/2012 Retroactive Cov Dates: July 2012; August 2012; September 2012  Case Worker: Truddie Crumble                         [phone]

## 2012-01-05 ENCOUNTER — Ambulatory Visit
Admission: RE | Admit: 2012-01-05 | Discharge: 2012-01-05 | Disposition: A | Discharge status: Home / Self Care | Payer: Medicaid Other | Source: Ambulatory Visit | Attending: Radiation Oncology | Admitting: Radiation Oncology

## 2012-01-08 ENCOUNTER — Ambulatory Visit
Admission: RE | Admit: 2012-01-08 | Discharge: 2012-01-08 | Disposition: A | Discharge status: Home / Self Care | Payer: Medicaid Other | Source: Ambulatory Visit | Attending: Radiation Oncology | Admitting: Radiation Oncology

## 2012-01-09 ENCOUNTER — Ambulatory Visit
Admission: RE | Admit: 2012-01-09 | Discharge: 2012-01-09 | Disposition: A | Discharge status: Home / Self Care | Payer: Medicaid Other | Source: Ambulatory Visit | Attending: Radiation Oncology | Admitting: Radiation Oncology

## 2012-01-10 ENCOUNTER — Ambulatory Visit
Admission: RE | Admit: 2012-01-10 | Discharge: 2012-01-10 | Disposition: A | Discharge status: Home / Self Care | Payer: Medicaid Other | Source: Ambulatory Visit | Attending: Radiation Oncology | Admitting: Radiation Oncology

## 2012-01-11 ENCOUNTER — Ambulatory Visit
Admission: RE | Admit: 2012-01-11 | Discharge: 2012-01-11 | Disposition: A | Discharge status: Home / Self Care | Payer: Medicaid Other | Source: Ambulatory Visit | Attending: Radiation Oncology | Admitting: Radiation Oncology

## 2012-01-12 ENCOUNTER — Encounter: Payer: Self-pay | Admitting: Radiation Oncology

## 2012-01-12 ENCOUNTER — Ambulatory Visit
Admission: RE | Admit: 2012-01-12 | Discharge: 2012-01-12 | Disposition: A | Discharge status: Home / Self Care | Payer: Medicaid Other | Source: Ambulatory Visit | Attending: Radiation Oncology | Admitting: Radiation Oncology

## 2012-01-12 VITALS — Wt 221.2 lb

## 2012-01-12 DIAGNOSIS — C61 Malignant neoplasm of prostate: Secondary | ICD-10-CM

## 2012-01-12 NOTE — Progress Notes (Signed)
Pt having slight irritation to bottom, bowels, 4 loose stools daily, started taking flomax in day time, was taking at  Night but  he has crazy dreams, only getting up 2x at night to void, no dysuria 2:33 PM

## 2012-01-12 NOTE — Progress Notes (Signed)
  Radiation Oncology         [phone] ________________________________  Name: [name] MRN: [id number]  Date: 01/12/2012  DOB: [birth year]  Weekly Radiation Therapy Management  Current Dose: 41.4 Gy     Planned Dose:  45 Gy plus seed implant  Narrative . . . . . . . . The patient presents for routine under treatment assessment.                                                     The patient is without complaint.                                 Set-up films were reviewed.                                 The chart was checked. Physical Findings. . . Weight essentially stable.  No significant changes. Impression . . . . . . . The patient is  tolerating radiation. Plan . . . . . . . . . . . . Continue treatment as planned.  Implant 2/14  ________________________________  Artist Pais Kathrynn Running, M.D.

## 2012-01-15 ENCOUNTER — Ambulatory Visit
Admission: RE | Admit: 2012-01-15 | Discharge: 2012-01-15 | Disposition: A | Discharge status: Home / Self Care | Payer: Medicaid Other | Source: Ambulatory Visit | Attending: Radiation Oncology | Admitting: Radiation Oncology

## 2012-01-16 ENCOUNTER — Ambulatory Visit
Admission: RE | Admit: 2012-01-16 | Discharge: 2012-01-16 | Disposition: A | Discharge status: Home / Self Care | Payer: Medicaid Other | Source: Ambulatory Visit | Attending: Radiation Oncology | Admitting: Radiation Oncology

## 2012-01-17 ENCOUNTER — Encounter: Payer: Self-pay | Admitting: Radiation Oncology

## 2012-01-17 ENCOUNTER — Ambulatory Visit: Payer: Medicaid Other

## 2012-01-17 NOTE — Progress Notes (Signed)
  Radiation Oncology         [phone] ________________________________  Name: [name] MRN: [id number]  Date: 12/08/2011  DOB: 18-Jun-1951  Simulation Verification Note  NARRATIVE: The patient was brought to the treatment unit and placed in the planned treatment position. The clinical setup was verified. Then worked films were obtained and up loaded to the radiation oncology medical record software.  The treatment beams were carefully compared against the planned radiation fields. The position location and shape of the radiation fields was reviewed. They targeted volume of tissue appears to be appropriately covered by the radiation beams. Organs at risk appear to be excluded as planned.  Based on my personal review, I approved the simulation verification. The patient's treatment to the prostate will proceed as planned.  ------------------------------------------------  Artist Pais Kathrynn Running, M.D.

## 2012-01-17 NOTE — Progress Notes (Signed)
  Radiation Oncology         [phone] ________________________________  Name: [name] MRN: [id number]  Date: 12/01/2011  DOB: 02-Jan-1951  SIMULATION AND TREATMENT PLANNING NOTE  NARRATIVE:  The patient was brought to the CT Simulation planning suite.  Identity was confirmed.  All relevant records and images related to the planned course of therapy were reviewed.  The patient freely provided informed written consent to proceed with treatment after reviewing the details related to the planned course of therapy. The consent form was witnessed and verified by the simulation staff.  Then, the patient was set-up in a stable reproducible  supine position for radiation therapy.  CT images were obtained.  Surface markings were placed.  The CT images were loaded into the planning software.  Then the target and avoidance structures were contoured.  Treatment planning then occurred.  The radiation prescription was entered and confirmed.  A total of 5 complex treatment devices were fabricated.  These included and alpha cradle for leg positioning and 4 Multi-Leaf Collimator apertures for treatment deliver.   I have requested : Isodose Plan.   PLAN:  The patient will receive 45 Gy in 25 fractions followed by seed implant.  ________________________________  Artist Pais. Kathrynn Running, M.D.

## 2012-02-01 LAB — CBC
HCT: 37.6 % — ABNORMAL LOW (ref 39.0–52.0)
Platelets: 185 10*3/uL (ref 150–400)
RDW: 13.5 % (ref 11.5–15.5)
WBC: 6.1 10*3/uL (ref 4.0–10.5)

## 2012-02-01 LAB — COMPREHENSIVE METABOLIC PANEL
AST: 18 U/L (ref 0–37)
Albumin: 3.9 g/dL (ref 3.5–5.2)
Calcium: 9.6 mg/dL (ref 8.4–10.5)
Creatinine, Ser: 0.65 mg/dL (ref 0.50–1.35)
Total Protein: 7 g/dL (ref 6.0–8.3)

## 2012-02-01 LAB — APTT: aPTT: 35 seconds (ref 24–37)

## 2012-02-01 LAB — PROTIME-INR
INR: 0.96 (ref 0.00–1.49)
Prothrombin Time: 13 seconds (ref 11.6–15.2)

## 2012-02-02 ENCOUNTER — Encounter (HOSPITAL_BASED_OUTPATIENT_CLINIC_OR_DEPARTMENT_OTHER): Payer: Self-pay | Admitting: *Deleted

## 2012-02-02 NOTE — Progress Notes (Signed)
NPO AFTER MN. ARRIVES AT 0615. LABS IN EPIC DONE 02-01-2012. CURRENT EKG AND CXR IN EPIC AND CHART. WILL TAKE METOPROLOL AM OF SURG. W/ SIP OF WATER AND DO FLEET ENEMA

## 2012-02-04 ENCOUNTER — Encounter: Payer: Self-pay | Admitting: Radiation Oncology

## 2012-02-04 NOTE — Progress Notes (Signed)
  Radiation Oncology         [phone] ________________________________  Name: [name] MRN: [id number]  Date: 01/16/2012  DOB: 03-04-1951  End of Treatment Note  Diagnosis:   61 year old gentleman with stage T2b adenocarcinoma prostate with a Gleason's score of 4+4 and PSA of 22     Indication for treatment:  Curative, combined androgen deprivation, external beam radiation treatment, and a prostate seed implant boost       Radiation treatment dates:   12/11/2011 through 01/16/2012  Site/dose:   The patient's pelvis received 45 gray 25 fractions 1.8 gray  Beams/energy:   A 4 field treatment technique was employed using anterior, posterior, right lateral, and left lateral gantry positions. Each received 18 megavolt photons to custom-shaped fields designed to cover the prostate, seminal vesicles and pelvic lymph nodes. Image guided radiotherapy was performed on a daily basis using the 3 gold marker seeds in the prostate.  Narrative: The patient tolerated radiation treatment relatively well.   He noted some minimal increase in urinary frequency. He did not notice any significant bowel changes other than increased frequency.  Plan: The patient has completed  the external beam component of his radiation treatment. The patient will return  for prostate seed implant boost in approximately 3 weeks.  ________________________________  Artist Pais Kathrynn Running, M.D.

## 2012-02-07 ENCOUNTER — Telehealth: Payer: Self-pay | Admitting: *Deleted

## 2012-02-08 ENCOUNTER — Encounter (HOSPITAL_BASED_OUTPATIENT_CLINIC_OR_DEPARTMENT_OTHER): Payer: Self-pay | Admitting: Anesthesiology

## 2012-02-08 ENCOUNTER — Ambulatory Visit (HOSPITAL_BASED_OUTPATIENT_CLINIC_OR_DEPARTMENT_OTHER)
Admission: RE | Admit: 2012-02-08 | Discharge: 2012-02-08 | Disposition: A | Discharge status: Home / Self Care | Payer: Medicaid Other | Source: Ambulatory Visit | Attending: Urology | Admitting: Urology

## 2012-02-08 ENCOUNTER — Ambulatory Visit (HOSPITAL_COMMUNITY): Payer: Medicaid Other

## 2012-02-08 ENCOUNTER — Encounter (HOSPITAL_BASED_OUTPATIENT_CLINIC_OR_DEPARTMENT_OTHER)
Admission: RE | Disposition: A | Discharge status: Home / Self Care | Payer: Self-pay | Source: Ambulatory Visit | Attending: Urology

## 2012-02-08 ENCOUNTER — Encounter (HOSPITAL_BASED_OUTPATIENT_CLINIC_OR_DEPARTMENT_OTHER): Payer: Self-pay | Admitting: *Deleted

## 2012-02-08 ENCOUNTER — Ambulatory Visit (HOSPITAL_BASED_OUTPATIENT_CLINIC_OR_DEPARTMENT_OTHER): Payer: Medicaid Other | Admitting: Anesthesiology

## 2012-02-08 DIAGNOSIS — C61 Malignant neoplasm of prostate: Secondary | ICD-10-CM | POA: Insufficient documentation

## 2012-02-08 DIAGNOSIS — I872 Venous insufficiency (chronic) (peripheral): Secondary | ICD-10-CM | POA: Insufficient documentation

## 2012-02-08 DIAGNOSIS — I2510 Atherosclerotic heart disease of native coronary artery without angina pectoris: Secondary | ICD-10-CM | POA: Insufficient documentation

## 2012-02-08 DIAGNOSIS — I252 Old myocardial infarction: Secondary | ICD-10-CM | POA: Insufficient documentation

## 2012-02-08 DIAGNOSIS — Z79899 Other long term (current) drug therapy: Secondary | ICD-10-CM | POA: Insufficient documentation

## 2012-02-08 DIAGNOSIS — E785 Hyperlipidemia, unspecified: Secondary | ICD-10-CM | POA: Insufficient documentation

## 2012-02-08 DIAGNOSIS — Z951 Presence of aortocoronary bypass graft: Secondary | ICD-10-CM | POA: Insufficient documentation

## 2012-02-08 DIAGNOSIS — I10 Essential (primary) hypertension: Secondary | ICD-10-CM | POA: Insufficient documentation

## 2012-02-08 DIAGNOSIS — F17200 Nicotine dependence, unspecified, uncomplicated: Secondary | ICD-10-CM | POA: Insufficient documentation

## 2012-02-08 HISTORY — DX: Atherosclerotic heart disease of native coronary artery without angina pectoris: I25.10

## 2012-02-08 HISTORY — DX: Nocturia: R35.1

## 2012-02-08 HISTORY — DX: ST elevation (STEMI) myocardial infarction of unspecified site: I21.3

## 2012-02-08 HISTORY — DX: Hyperlipidemia, unspecified: E78.5

## 2012-02-08 HISTORY — DX: Venous insufficiency (chronic) (peripheral): I87.2

## 2012-02-08 HISTORY — DX: Hyperglycemia, unspecified: R73.9

## 2012-02-08 HISTORY — DX: Mixed hyperlipidemia: E78.2

## 2012-02-08 HISTORY — DX: Hesitancy of micturition: R39.11

## 2012-02-08 HISTORY — DX: Presence of aortocoronary bypass graft: Z95.1

## 2012-02-08 HISTORY — PX: CYSTOSCOPY: SHX5120

## 2012-02-08 HISTORY — PX: RADIOACTIVE SEED IMPLANT: SHX5150

## 2012-02-08 SURGERY — INSERTION, RADIATION SOURCE, PROSTATE
Anesthesia: General | Site: Bladder

## 2012-02-08 MED ORDER — ONDANSETRON HCL 4 MG/2ML IJ SOLN: 4.0000 mg | Freq: Four times a day (QID) | INTRAMUSCULAR | Status: DC | PRN

## 2012-02-08 MED ORDER — LIDOCAINE HCL (CARDIAC) 20 MG/ML IV SOLN
INTRAVENOUS | Status: DC | PRN
  Administered 2012-02-08: 100 mg via INTRAVENOUS

## 2012-02-08 MED ORDER — LACTATED RINGERS IV SOLN
INTRAVENOUS | Status: DC
  Administered 2012-02-08 (×2): via INTRAVENOUS

## 2012-02-08 MED ORDER — FENTANYL CITRATE 0.05 MG/ML IJ SOLN: 25.0000 ug | INTRAMUSCULAR | Status: DC | PRN

## 2012-02-08 MED ORDER — OXYCODONE HCL 5 MG PO TABS: 5.0000 mg | ORAL_TABLET | ORAL | Status: DC | PRN

## 2012-02-08 MED ORDER — PROMETHAZINE HCL 25 MG/ML IJ SOLN: 6.2500 mg | INTRAMUSCULAR | Status: DC | PRN

## 2012-02-08 MED ORDER — MORPHINE SULFATE 2 MG/ML IJ SOLN: 1.0000 mg | INTRAMUSCULAR | Status: DC | PRN

## 2012-02-08 MED ORDER — FENTANYL CITRATE 0.05 MG/ML IJ SOLN
INTRAMUSCULAR | Status: DC | PRN
  Administered 2012-02-08: 25 ug via INTRAVENOUS
  Administered 2012-02-08: 50 ug via INTRAVENOUS
  Administered 2012-02-08: 25 ug via INTRAVENOUS
  Administered 2012-02-08: 50 ug via INTRAVENOUS

## 2012-02-08 MED ORDER — ACETAMINOPHEN 325 MG PO TABS: 650.0000 mg | ORAL_TABLET | ORAL | Status: DC | PRN

## 2012-02-08 MED ORDER — SODIUM CHLORIDE 0.9 % IJ SOLN: 3.0000 mL | Freq: Two times a day (BID) | INTRAMUSCULAR | Status: DC

## 2012-02-08 MED ORDER — SODIUM CHLORIDE 0.9 % IJ SOLN: 3.0000 mL | INTRAMUSCULAR | Status: DC | PRN

## 2012-02-08 MED ORDER — EPHEDRINE SULFATE 50 MG/ML IJ SOLN
INTRAMUSCULAR | Status: DC | PRN
  Administered 2012-02-08: 10 mg via INTRAVENOUS

## 2012-02-08 MED ORDER — STERILE WATER FOR IRRIGATION IR SOLN
Status: DC | PRN
  Administered 2012-02-08: 3000 mL

## 2012-02-08 MED ORDER — FLEET ENEMA 7-19 GM/118ML RE ENEM: 1.0000 | ENEMA | Freq: Once | RECTAL | Status: DC

## 2012-02-08 MED ORDER — IOHEXOL 350 MG/ML SOLN
INTRAVENOUS | Status: DC | PRN
  Administered 2012-02-08: 50 mL

## 2012-02-08 MED ORDER — PROPOFOL 10 MG/ML IV EMUL
INTRAVENOUS | Status: DC | PRN
  Administered 2012-02-08: 180 mg via INTRAVENOUS

## 2012-02-08 MED ORDER — ONDANSETRON HCL 4 MG/2ML IJ SOLN
INTRAMUSCULAR | Status: DC | PRN
  Administered 2012-02-08: 4 mg via INTRAVENOUS

## 2012-02-08 MED ORDER — DEXAMETHASONE SODIUM PHOSPHATE 4 MG/ML IJ SOLN
INTRAMUSCULAR | Status: DC | PRN
  Administered 2012-02-08: 8 mg via INTRAVENOUS

## 2012-02-08 MED ORDER — SODIUM CHLORIDE 0.9 % IV SOLN: 250.0000 mL | INTRAVENOUS | Status: DC | PRN

## 2012-02-08 MED ORDER — PROMETHAZINE HCL 25 MG/ML IJ SOLN: 12.5000 mg | Freq: Four times a day (QID) | INTRAMUSCULAR | Status: DC | PRN

## 2012-02-08 MED ORDER — MIDAZOLAM HCL 5 MG/5ML IJ SOLN
INTRAMUSCULAR | Status: DC | PRN
  Administered 2012-02-08: 2 mg via INTRAVENOUS

## 2012-02-08 MED ORDER — CIPROFLOXACIN IN D5W 400 MG/200ML IV SOLN
400.0000 mg | INTRAVENOUS | Status: AC
  Administered 2012-02-08: 400 mg via INTRAVENOUS

## 2012-02-08 MED ORDER — ACETAMINOPHEN 650 MG RE SUPP: 650.0000 mg | RECTAL | Status: DC | PRN

## 2012-02-08 SURGICAL SUPPLY — 26 items
BAG URINE DRAINAGE (UROLOGICAL SUPPLIES) ×3 IMPLANT
BLADE SURG ROTATE 9660 (MISCELLANEOUS) ×3 IMPLANT
CATH FOLEY 2WAY SLVR  5CC 16FR (CATHETERS) ×1
CATH FOLEY 2WAY SLVR 5CC 16FR (CATHETERS) ×2 IMPLANT
CATH ROBINSON RED A/P 20FR (CATHETERS) ×3 IMPLANT
CLOTH BEACON ORANGE TIMEOUT ST (SAFETY) ×3 IMPLANT
COVER MAYO STAND STRL (DRAPES) ×3 IMPLANT
COVER TABLE BACK 60X90 (DRAPES) ×3 IMPLANT
DRSG TEGADERM 4X4.75 (GAUZE/BANDAGES/DRESSINGS) ×3 IMPLANT
DRSG TEGADERM 8X12 (GAUZE/BANDAGES/DRESSINGS) ×3 IMPLANT
GLOVE BIO SURGEON STRL SZ7.5 (GLOVE) IMPLANT
GLOVE BIO SURGEON STRL SZ8 (GLOVE) ×6 IMPLANT
GLOVE BIOGEL PI IND STRL 6.5 (GLOVE) IMPLANT
GLOVE BIOGEL PI INDICATOR 6.5 (GLOVE) ×4
GLOVE ECLIPSE 8.0 STRL XLNG CF (GLOVE) ×4 IMPLANT
GOWN STRL NON-REIN LRG LVL3 (GOWN DISPOSABLE) ×1 IMPLANT
GOWN STRL REIN XL XLG (GOWN DISPOSABLE) ×3 IMPLANT
GOWN XL W/COTTON TOWEL STD (GOWNS) ×3 IMPLANT
HOLDER FOLEY CATH W/STRAP (MISCELLANEOUS) ×3 IMPLANT
PACK CYSTOSCOPY (CUSTOM PROCEDURE TRAY) ×3 IMPLANT
Radioactive Seed Implant ×56 IMPLANT
SPONGE GAUZE 4X4 12PLY (GAUZE/BANDAGES/DRESSINGS) ×1 IMPLANT
SYRINGE 10CC LL (SYRINGE) ×3 IMPLANT
UNDERPAD 30X30 INCONTINENT (UNDERPADS AND DIAPERS) ×6 IMPLANT
WATER STERILE IRR 3000ML UROMA (IV SOLUTION) ×3 IMPLANT
WATER STERILE IRR 500ML POUR (IV SOLUTION) ×3 IMPLANT

## 2012-02-08 NOTE — Anesthesia Postprocedure Evaluation (Signed)
  Anesthesia Post-op Note  Patient: [name]  Procedure(s) Performed: Procedure(s) (LRB): RADIOACTIVE SEED IMPLANT (N/A) CYSTOSCOPY FLEXIBLE ()  Patient Location: PACU  Anesthesia Type: General  Level of Consciousness: oriented and sedated  Airway and Oxygen Therapy: Patient Spontanous Breathing  Post-op Pain: mild  Post-op Assessment: Post-op Vital signs reviewed, Patient's Cardiovascular Status Stable, Respiratory Function Stable and Patent Airway  Post-op Vital Signs: stable  Complications: No apparent anesthesia complications

## 2012-02-08 NOTE — Progress Notes (Signed)
Pt voided 100cc's clear yellow urine.  Admits to some hesitation and burning w/ urination.[place] for d/c to home.  Pt instructed to call the office by 330/4pm today if he has difficulty voiding. Pt verbalizes his understanding.

## 2012-02-08 NOTE — H&P (Signed)
Urology Admission H&P  Chief Complaint: Prostate cancer  History of Present Illness:    This gentleman comes in today for brachytherapy to complete combination radiotherapy of the prostate.  . I first saw him in July 2012 for scrotal  swelling. More than likely, that was due to an insect bite. On exam, he had a nodular prostate and his PSA was checked, and returned 22. Ultrasound and biopsy was performed on 08/18/2011. There were no suspicious areas on his ultrasound. Glandular size was 24.5 cc. All cores returned with cancer, as follows:  3 biopsies returned Gleason 3+4, 8 biopsies returned Gleason 4+3 One biopsy returned Gleason 4+4, with extra prostatic extension.  Perineural invasion was identified in 6/12 biopsies.  CT of the abdomen and pelvis  revealed no evidence of extra prostatic disease. Bone scan was also performed, and showed one area of increased uptake at the costochondral junction seventh rib.   He has consulted with Dr. Kathrynn Running at the [facility], who recomended that combination radiotherapy be preceeded with 2-3 months of ADT, which should continue for at least 2 years. He bagan androgen deprivation on 09/27/2011, when he received 240 mg of Firmagon.  He presents now for brachytherapy.   Past Medical History  Diagnosis Date  . Myocardial infarction   . Hypertension   . S/P CABG x 4   . ST elevation MI (STEMI) 02-10-2008    S/P CABG  . Hyperlipidemia   . Mixed dyslipidemia   . Chronic venous insufficiency LOWER EXTREMITIES  . Coronary artery disease CARDIOLOGIST - DR  [id number]-  LAST 1 WK AGO -- WILL REQUEST NOTE AND STRESS TEST  . Urinary hesitancy   . Hyperglycemia   . Nocturia   . Prostate cancer 08/18/11    gleason 8, volume 24.4cc   Past Surgical History  Procedure Date  . Shoulder surgery 1992    LEFT  . Knee surgery age 82    RIGHT  . Multiple teeth extractions (23)/ four quadrant alveolplasty/ mandibular lateral exostoses reductions  03-24-2010    CHRONIC PERIODONTITIS  . Coronary artery bypass graft 02-10-2008  DR Jacinto Halim    X4 VESSEL DISEASE / [facility] CABG  . Cardiovascular stress test 03-15-2010    INFERIOR WALL SCAR WITHOUT ANY MEANINGFUL ISCHEMIA/ EF 46% / LOW RISK SCAN    Home Medications:  Prescriptions prior to admission  Medication Sig Dispense Refill  . aspirin 81 MG EC tablet Take 162 mg by mouth daily.       Marland Kitchen ibuprofen (ADVIL,MOTRIN) 200 MG tablet Take 200 mg by mouth as needed. For pain      . metoprolol tartrate (LOPRESSOR) 25 MG tablet Take 25 mg by mouth 2 (two) times daily.       . Omega-3 Fatty Acids (FISH OIL) 1000 MG CAPS Take 1 capsule by mouth daily.       . pravastatin (PRAVACHOL) 40 MG tablet Take 80 mg by mouth every evening.       . Tamsulosin HCl (FLOMAX) 0.4 MG CAPS Take 1 capsule (0.4 mg total) by mouth daily after supper.  30 capsule  6   Allergies: No Known Allergies  Family History  Problem Relation Age of Onset  . Cancer Brother     pancreatic   Social History:  reports that he has been smoking Cigarettes.  He has a 50 pack-year smoking history. He has never used smokeless tobacco. He reports that he drinks alcohol. He reports that he does not use illicit drugs.  Review of Systems  All other systems reviewed and are negative.    Physical Exam:  Vital signs in last 24 hours: Temp:  [96.8 F (36 C)] 96.8 F (36 C) (02/14 0714) Pulse Rate:  [55] 55  (02/14 0714) Resp:  [20] 20  (02/14 0714) BP: (104)/(64) 104/64 mmHg (02/14 0714) SpO2:  [98 %] 98 % (02/14 0714) Physical Exam  Constitutional: He is oriented to person, place, and time. He appears well-developed and well-nourished.  HENT:  Head: Normocephalic and atraumatic.  Eyes: Conjunctivae are normal. Pupils are equal, round, and reactive to light.  Neck: Normal range of motion. Neck supple.  Cardiovascular: Normal rate and regular rhythm.   Respiratory: Effort normal and breath sounds normal.  GI: Soft.    Genitourinary: Rectum normal and penis normal.       Prostate nodularity present  Musculoskeletal: Normal range of motion.  Neurological: He is alert and oriented to person, place, and time.  Skin: Skin is warm and dry.  Psychiatric: He has a normal mood and affect.    Laboratory Data:  No results found for this or any previous visit (from the past 24 hour(s)). No results found for this or any previous visit (from the past 240 hour(s)). Creatinine:  Basename 02/01/12 1500  CREATININE 0.65   Baseline Creatinine:   Impression/Assessment:  Adenocarcinoma of the prostate. Clinical stage TII, Gleason 4+4. His PSA is 22, and all 12 of the core samples taken with his ultrasound revealed adenocarcinoma, one revealed Gleason 4+4, the rest revealed Gleason 4+3 or 3+4. CT scan was unremarkable, there is one focus on the bone scan at the costochondral junction of the seventh rib which is most likely noncancerous.  He has begun androgen depravation therapy with 240 mg of Firmagon administered on 09/27/2011. He Has completed external beam radiotherapy, and presents for brachytherapy.    Plan:  I-125 brachytherapy.  Marcine Matar M 02/08/2012, 7:23 AM

## 2012-02-08 NOTE — Anesthesia Procedure Notes (Addendum)
Procedure Name: LMA Insertion Date/Time: 02/08/2012 7:43 AM Performed by: Renella Cunas D Pre-anesthesia Checklist: Patient identified, Emergency Drugs available, Suction available and Patient being monitored Patient Re-evaluated:Patient Re-evaluated prior to inductionOxygen Delivery Method: Circle System Utilized Preoxygenation: Pre-oxygenation with 100% oxygen Intubation Type: IV induction Ventilation: Mask ventilation without difficulty LMA: LMA inserted LMA Size: 4.0 Number of attempts: 1 Airway Equipment and Method: bite block Placement Confirmation: positive ETCO2 Tube secured with: Tape Dental Injury: Teeth and Oropharynx as per pre-operative assessment

## 2012-02-08 NOTE — Anesthesia Preprocedure Evaluation (Signed)
Anesthesia Evaluation  Patient identified by MRN, date of birth, ID band Patient awake    Reviewed: Allergy & Precautions, H&P , NPO status , Patient's Chart, lab work & pertinent test results, reviewed documented beta blocker date and time   Airway Mallampati: II TM Distance: >3 FB Neck ROM: Full    Dental  (+) Upper Dentures and Lower Dentures   Pulmonary Current Smoker,  clear to auscultation  + decreased breath sounds      Cardiovascular hypertension, Pt. on medications + CAD and + Past MI Regular Normal CAD, s/p CABG 2008 Currently asymptomatic Given CV clearance   Neuro/Psych Negative Neurological ROS  Negative Psych ROS   GI/Hepatic negative GI ROS, Neg liver ROS,   Endo/Other  Borderline DM, no Rx  Renal/GU negative Renal ROS   Prostate cancer    Musculoskeletal negative musculoskeletal ROS (+)   Abdominal   Peds negative pediatric ROS (+)  Hematology negative hematology ROS (+)   Anesthesia Other Findings   Reproductive/Obstetrics negative OB ROS                           Anesthesia Physical Anesthesia Plan  ASA: III  Anesthesia Plan: General   Post-op Pain Management:    Induction: Intravenous  Airway Management Planned: LMA  Additional Equipment:   Intra-op Plan:   Post-operative Plan: Extubation in OR  Informed Consent: I have reviewed the patients History and Physical, chart, labs and discussed the procedure including the risks, benefits and alternatives for the proposed anesthesia with the patient or authorized representative who has indicated his/her understanding and acceptance.     Plan Discussed with: CRNA and Surgeon  Anesthesia Plan Comments:         Anesthesia Quick Evaluation

## 2012-02-08 NOTE — Progress Notes (Signed)
  Radiation Oncology         [phone] ________________________________  Name: [name] MRN: [id number]  Date: 02/08/2012  DOB: February 23, 1951       Prostate Seed Implant  CC:No primary provider on file.  No ref. provider found  DIAGNOSIS: A 61 year old gentlemen with stage T2b adenocarcinoma of the prostate with a Gleason of 4+4 and a PSA of 22.  PROCEDURE: Insertion of radioactive I-125 seeds into the prostate gland.  RADIATION DOSE: 110 Gy, boost therapy.  TECHNIQUE: [name] was brought to the operating room with the urologist. He was placed in the dorsolithotomy position. He was catheterized and a rectal tube was inserted. The perineum was shaved, prepped and draped. The ultrasound probe was then introduced into the rectum to see the prostate gland.  TREATMENT DEVICE: A needle grid was attached to the ultrasound probe stand and anchor needles were placed.  COMPLEX ISODOSE CALCULATION: The prostate was imaged in 3D using a sagittal sweep of the prostate probe. These images were transferred to the planning computer. There, the prostate, urethra and rectum were defined on each axial reconstructed image. Then, the software created an optimized plan and a few seed positions were adjusted. Then the accepted plan was uploaded to the seed Selectron afterloading unit.  SPECIAL TREATMENT PROCEDURE/SUPERVISION AND HANDLING: The Nucletron FIRST system was used to place the needles under sagittal guidance. A total of 21 needles were used to deposit 56 seeds in the prostate gland. The individual seed activity was 17.248 mCi for a total implant activity of 0.311 mCi.  COMPLEX SIMULATION: At the end of the procedure, an anterior radiograph of the pelvis was obtained to document seed positioning and count. Cystoscopy was performed to check the urethra and bladder.  MICRODOSIMETRY: At the end of the procedure, the patient was emitting 0.120 mrem/hr at 1 meter. Accordingly, he was considered  safe for hospital discharge.  PLAN: The patient will return to the radiation oncology clinic for post implant CT dosimetry in three weeks.   ________________________________  Artist Pais Kathrynn Running, M.D.

## 2012-02-08 NOTE — Discharge Instructions (Signed)
Call the office today by 330 - 4 pm if you're having difficulty urinating. Remove dressing to perineum tonite and leave area open to air. Brachytherapy for Prostate Cancer Care After  Refer to this sheet in the next few weeks. These instructions provide you with information on caring for yourself after your procedure. Your caregiver may also give you more specific instructions. Your treatment has been planned according to current medical practices, but problems sometimes occur. Call your caregiver if you have any problems or questions after your procedure. HOME CARE INSTRUCTIONS   Only take over-the-counter or prescription medicines for pain, discomfort, or fever as directed by your caregiver.   Keep all follow-up appointments as directed by your caregiver.  SEEK MEDICAL CARE IF:   You have persistent or increasing nausea.   You have persistent blood in your stools.   You have abdominal swelling (distention) or tenderness.  SEEK IMMEDIATE MEDICAL CARE IF:   You have a fever or persistent symptoms for more than 72 hours.   You have a fever and your symptoms suddenly get worse.   You have trouble urinating or are unable to urinate.   You have burning with urination or develop cloudy urine.   You develop any problems that seem to be getting worse.  MAKE SURE YOU:  Understand these instructions.   Will watch your condition.   Will get help right away if you are not doing well or get worse.  Document Released: 01/13/2011 Document Revised: 08/23/2011 Document Reviewed: 01/13/2011 [facility] Patient Information 2012 [place], [place].Brachytherapy for Prostate Cancer Care After  Refer to this sheet in the next few weeks. These instructions provide you with information on caring for yourself after your procedure. Your caregiver may also give you more specific instructions. Your treatment has been planned according to current medical practices, but problems sometimes occur. Call your caregiver  if you have any problems or questions after your procedure. HOME CARE INSTRUCTIONS   Only take over-the-counter or prescription medicines for pain, discomfort, or fever as directed by your caregiver.   Keep all follow-up appointments as directed by your caregiver.  SEEK MEDICAL CARE IF:   You have persistent or increasing nausea.   You have persistent blood in your stools.   You have abdominal swelling (distention) or tenderness.  SEEK IMMEDIATE MEDICAL CARE IF:   You have a fever or persistent symptoms for more than 72 hours.   You have a fever and your symptoms suddenly get worse.   You have trouble urinating or are unable to urinate.   You have burning with urination or develop cloudy urine.   You develop any problems that seem to be getting worse.  MAKE SURE YOU:  Understand these instructions.   Will watch your condition.   Will get help right away if you are not doing well or get worse.  Document Released: 01/13/2011 Document Revised: 08/23/2011 Document Reviewed: 01/13/2011 [facility] Patient Information 2012 [place], [place].Cystoscopy (Bladder Exam) Care After Refer to this sheet in the next few weeks. These discharge instructions provide you with general information on caring for yourself after you leave the hospital. Your caregiver may also give you specific instructions. Your treatment has been planned according to the most current medical practices available, but unavoidable complications sometimes occur. If you have any problems or questions after discharge, please call your caregiver. AFTER THE PROCEDURE   There may be temporary bleeding and burning with urination.   Drink enough water and fluids to keep your urine clear  or pale yellow.  FINDING OUT THE RESULTS OF YOUR TEST Not all test results are available during your visit. If your test results are not back during the visit, make an appointment with your caregiver to find out the results. Do not assume  everything is normal if you have not heard from your caregiver or the medical facility. It is important for you to follow up on all of your test results. SEEK IMMEDIATE MEDICAL CARE IF:   There is an increase in blood in the urine or you are passing clots.   There is difficulty passing urine.   You develop the chills.   You have an oral temperature above 102 F (38.9 C), not controlled by medicine.   Belly (abdominal) pain develops.  Document Released: 06/30/2005 Document Revised: 08/23/2011 Document Reviewed: 04/27/2008 [facility] Patient Information 2012 [place], [place].General Anesthetic, Adult A doctor specialized in giving anesthesia (anesthesiologist) or a nurse specialized in giving anesthesia (nurse anesthetist) gives medicine that makes you sleep while a procedure is performed (general anesthetic). Once the general anesthetic has been administered, you will be in a sleeplike state in which you feel no pain. After having a general anestheticyou may feel:   Dizzy.   Weak.   Drowsy.   Confused.  These feelings are normal and can be expected to last for up to 24 hours after the procedure is completed.  LET YOUR CAREGIVER KNOW ABOUT:  Allergies you have.   Medications you are taking, including herbs, eye drops, over the counter medications, dietary supplements, and creams.   Previous problems you have had with anesthetics or numbing medicines.   Use of cigarettes, alcohol, or illicit drugs.   Possibility of pregnancy, if this applies.   History of bleeding or blood disorders, including blood clots and clotting disorders.   Previous surgeries you have had and types of anesthetics you have received.   Family medical history, especially anesthetic problems.   Other health problems.  BEFORE THE PROCEDURE  You may brush your teeth on the morning of surgery but you should have no solid food or non-clear liquids for a minimum of 8 hours prior to your procedure. Clear  liquids (water, apple juice, black coffee, and tea) are acceptable in small amounts until 2 hours prior to your procedure.   You may take your regular medications the morning of your procedure unless your caregiver indicates otherwise.  AFTER THE PROCEDURE  After surgery, you will be taken to the recovery area where a nurse will monitor your progress. You will be allowed to go home when you are awake, stable, taking fluids well, and without serious pain or complications.   For the first 24 hours following an anesthetic:   Have a responsible person with you.   Do not drive a car. If you are alone, do not take public transportation.   Do not engage in strenuous activity. You may usually resume normal activities the next day, or as advised by your caregiver.   Do not drink alcohol.   Do not take medicine that has not been prescribed by your caregiver.   Do not sign important papers or make important decisions as your judgement may be impaired.   You may resume a normal diet as directed.   Change bandages (dressings) as directed.   Only take over-the-counter or prescription medicines for pain, discomfort, or fever as directed by your caregiver.  If you have questions or problems that seem related to the anesthetic, call the hospital and ask  for the anesthetist, anesthesiologist, or anesthesia department. SEEK IMMEDIATE MEDICAL CARE IF:   You develop a rash.   You have difficulty breathing.   You have chest pain.   You have allergic problems.   You have uncontrolled nausea.   You have uncontrolled vomiting.   You develop any serious bleeding, especially from the incision site.  Document Released: 03/19/2008 Document Revised: 08/23/2011 Document Reviewed: 04/13/2011 [facility] Patient Information 2012 [place], [place].

## 2012-02-08 NOTE — Transfer of Care (Signed)
Immediate Anesthesia Transfer of Care Note  Patient: [name]  Procedure(s) Performed: Procedure(s) (LRB): RADIOACTIVE SEED IMPLANT (N/A) CYSTOSCOPY FLEXIBLE ()  Patient Location: PACU  Anesthesia Type: General  Level of Consciousness: awake, oriented, sedated and patient cooperative  Airway & Oxygen Therapy: Patient Spontanous Breathing and Patient connected to face mask oxygen  Post-op Assessment: Report given to PACU RN and Post -op Vital signs reviewed and stable  Post vital signs: Reviewed and stable  Complications: No apparent anesthesia complications

## 2012-02-09 ENCOUNTER — Encounter (HOSPITAL_BASED_OUTPATIENT_CLINIC_OR_DEPARTMENT_OTHER): Payer: Self-pay | Admitting: Urology

## 2012-02-13 NOTE — Op Note (Signed)
Preoperative diagnosis:adenocarcinoma of the prostate, clinical stage T2b Postoperative diagnosis:same  Procedure:I-125 brachytherapy, cystoscopy   Surgeon: [name] Millard. Elwood Bazinet, M.D.  Radiotherapist: Kathrynn Running, M.D. Anesthesia: Gen.  Indications:61 year old male who initially presented to my office in July, 2012 with an insect bite to his genitals.he had a nodular prostate, as well as a PSA of approximately 22.ultrasound and biopsy of his prostate was performed on 08/18/2011.all 12 biopsies showed adenocarcinoma, 3 of which revealed a Gleason 3+4, 8 revealed a Gleason 4+3, and one revealed a Gleason 4+4 pattern with extra prostatic extension. Perineural invasion was present. The patient was started on androgen deprivation, and has completed external beam radiotherapy. He presents at this time for brachytherapy to complete combination radiotherapy. He is aware of risks and complications of brachytherapy, which have been discussed by both me and Dr. Kathrynn Running.     Technique and findings:the patient was properly identified in the holding area and received preoperative IV antibiotics. He was taken to the operating room where general anesthetic was administered with the LMA. He was placed in the dorsolithotomy position. Genitalia and perineum were prepped, his bladder was catheterized with a Foley catheter, and his scrotum was retracted anteriorly. Dr. Kathrynn Running then proceeded with placement of the transrectal ultrasound probe, the brachytherapy template, as well as a fixation needles. The prostate was scanned, and treatment plan was performed.  I then entered the room. Repeat timeout was performed. According to the printed treatment plan, the appropriate needles were placed with corresponding seedsloaded by the nucletron device. Please see the treatment planning sheet for specifics. Following placement of all needles in their corresponding seeds, the fixation needles were removed, the ultrasound probe was removed,  and fluoroscopic images were saved. The catheter was then removed, and flexible cystoscopy performed. This revealed a normal urethra without obstruction of the prostate. There was no evidence of trauma to the bladder, no blood was seen, and no iodine seeds were visualized. Ureteral orifices were normal in configuration and location. There was no significant trabeculation of the bladder, and no bladder tumors were seen.at this point approximately 150 cc of saline was left in the patient's bladder and the scope removed.  The patient tolerated the procedure well and was returned to the PACU in stable condition.

## 2012-02-16 NOTE — Telephone Encounter (Signed)
xxx

## 2012-03-01 ENCOUNTER — Ambulatory Visit: Payer: Medicaid Other | Admitting: Radiation Oncology

## 2012-03-07 ENCOUNTER — Telehealth: Payer: Self-pay | Admitting: *Deleted

## 2012-03-07 NOTE — Telephone Encounter (Signed)
XXXX 

## 2012-03-08 ENCOUNTER — Encounter: Payer: Self-pay | Admitting: Radiation Oncology

## 2012-03-08 ENCOUNTER — Ambulatory Visit
Admission: RE | Admit: 2012-03-08 | Discharge: 2012-03-08 | Disposition: A | Discharge status: Home / Self Care | Payer: Medicaid Other | Source: Ambulatory Visit | Attending: Radiation Oncology | Admitting: Radiation Oncology

## 2012-03-08 VITALS — BP 126/76 | HR 72 | Temp 98.2°F | Resp 20 | Wt 220.6 lb

## 2012-03-08 DIAGNOSIS — C61 Malignant neoplasm of prostate: Secondary | ICD-10-CM

## 2012-03-08 NOTE — Progress Notes (Signed)
  Radiation Oncology         [phone] ________________________________  Name: [name] MRN: [id number]  Date: 03/08/2012  DOB: 04/30/1951  Follow-Up Visit Note  CC: No primary provider on file.  No ref. provider found  Diagnosis:   61 yo man with high risk prostate cancer  Interval Since Last Radiation:  1 months  Narrative:  The patient returns today for routine follow-up.  Significant urinary symptoms IPSS=24                              ALLERGIES:   has no known allergies.  Meds: Current Outpatient Prescriptions  Medication Sig Dispense Refill  . aspirin 81 MG EC tablet Take 162 mg by mouth daily.       Marland Kitchen ibuprofen (ADVIL,MOTRIN) 200 MG tablet Take 200 mg by mouth as needed. For pain      . pravastatin (PRAVACHOL) 40 MG tablet Take 80 mg by mouth every evening.       . Tamsulosin HCl (FLOMAX) 0.4 MG CAPS Take 1 capsule (0.4 mg total) by mouth daily after supper.  30 capsule  6  . metoprolol tartrate (LOPRESSOR) 25 MG tablet Take 25 mg by mouth 2 (two) times daily.       . Omega-3 Fatty Acids (FISH OIL) 1000 MG CAPS Take 1 capsule by mouth daily.         Physical Findings: The patient is in no acute distress. Patient is alert and oriented.  weight is 220 lb 9.6 oz (100.064 kg). His oral temperature is 98.2 F (36.8 C). His blood pressure is 126/76 and his pulse is 72. His respiration is 20. Marland Kitchen  No significant changes.  Lab Findings: Lab Results  Component Value Date   WBC 6.1 02/01/2012   HGB 13.2 02/01/2012   HCT 37.6* 02/01/2012   MCV 88.3 02/01/2012   PLT 185 02/01/2012   Impression:  The patient is recovering from the effects of radiation.  Plan:  We talked about the long term effects of radiation.  _____________________________________  Artist Pais Kathrynn Running, M.D.

## 2012-03-08 NOTE — Progress Notes (Signed)
Pt reports urinary stream weak, passed "a little blood last weekend". He states this was after "he had to go really bad and squeezed. When he urinated it was bloody." none since then. IPSS score reveals urgency, freq, nocturia, stream stops/starts, weak stream.

## 2012-03-25 ENCOUNTER — Encounter: Payer: Self-pay | Admitting: Radiation Oncology

## 2012-04-01 ENCOUNTER — Ambulatory Visit
Admission: RE | Admit: 2012-04-01 | Discharge: 2012-04-01 | Disposition: A | Discharge status: Home / Self Care | Payer: Medicaid Other | Source: Ambulatory Visit | Attending: Radiation Oncology | Admitting: Radiation Oncology

## 2012-04-01 DIAGNOSIS — C61 Malignant neoplasm of prostate: Secondary | ICD-10-CM | POA: Insufficient documentation

## 2012-04-24 ENCOUNTER — Encounter (HOSPITAL_COMMUNITY): Payer: Self-pay | Admitting: Pharmacy Technician

## 2012-04-24 ENCOUNTER — Other Ambulatory Visit: Payer: Self-pay | Admitting: Cardiovascular Disease

## 2012-04-26 NOTE — Progress Notes (Signed)
  Radiation Oncology         [phone] ________________________________  Name: [name] MRN: [id number]  Date: 03/25/2012  DOB: 30-Dec-1950  3-D Planning Note Prostate Brachytherapy  Diagnosis: 61 year old gentleman with stage T2b. adenocarcinoma of the prostate with a Gleason's score of 4+4 and PSA of 22  Narrative: [name] returned following prostate seed implantation for post implant planning. He underwent CT scan to delineate the three-dimensional structures of the pelvis and demonstrate the radiation distribution.  Results:   Prostate Coverage - The dose of radiation delivered to the 90% or more of the prostate gland (D90) was 105.77% of the prescription dose. This exceeds our goal of greater than 90%. Rectal Sparing - The volume of rectal tissue receiving the prescription dose or higher was 0.01 cc. This falls under our thresholds tolerance of 1.0 cc.  Impression: The prostate seed implant appears to show adequate target coverage and appropriate rectal sparing.  Plan:  The patient will continue to follow with urology for ongoing PSA determinations. I would anticipate a high likelihood for local tumor control with minimal risk for rectal morbidity.   Artist Pais Kathrynn Running, M.D.

## 2012-05-01 ENCOUNTER — Ambulatory Visit (HOSPITAL_COMMUNITY)
Admission: RE | Admit: 2012-05-01 | Discharge: 2012-05-01 | Disposition: A | Discharge status: Home / Self Care | Payer: Medicaid Other | Source: Ambulatory Visit | Attending: Cardiovascular Disease | Admitting: Cardiovascular Disease

## 2012-05-01 ENCOUNTER — Encounter (HOSPITAL_COMMUNITY)
Admission: RE | Disposition: A | Discharge status: Home / Self Care | Payer: Self-pay | Source: Ambulatory Visit | Attending: Cardiovascular Disease

## 2012-05-01 DIAGNOSIS — I2510 Atherosclerotic heart disease of native coronary artery without angina pectoris: Secondary | ICD-10-CM | POA: Insufficient documentation

## 2012-05-01 DIAGNOSIS — I509 Heart failure, unspecified: Secondary | ICD-10-CM | POA: Insufficient documentation

## 2012-05-01 DIAGNOSIS — Z951 Presence of aortocoronary bypass graft: Secondary | ICD-10-CM | POA: Insufficient documentation

## 2012-05-01 DIAGNOSIS — I209 Angina pectoris, unspecified: Secondary | ICD-10-CM | POA: Insufficient documentation

## 2012-05-01 HISTORY — PX: CARDIAC CATHETERIZATION: SHX172

## 2012-05-01 HISTORY — PX: LEFT HEART CATHETERIZATION WITH CORONARY/GRAFT ANGIOGRAM: SHX5450

## 2012-05-01 LAB — GLUCOSE, CAPILLARY: Glucose-Capillary: 87 mg/dL (ref 70–99)

## 2012-05-01 SURGERY — LEFT HEART CATHETERIZATION WITH CORONARY/GRAFT ANGIOGRAM
Anesthesia: LOCAL

## 2012-05-01 MED ORDER — SODIUM CHLORIDE 0.9 % IV SOLN
INTRAVENOUS | Status: DC
  Administered 2012-05-01: 09:00:00 via INTRAVENOUS

## 2012-05-01 MED ORDER — SODIUM CHLORIDE 0.9 % IJ SOLN: 3.0000 mL | INTRAMUSCULAR | Status: DC | PRN

## 2012-05-01 MED ORDER — LIDOCAINE HCL (PF) 1 % IJ SOLN
INTRAMUSCULAR | Status: AC
  Filled 2012-05-01: qty 30

## 2012-05-01 MED ORDER — MIDAZOLAM HCL 2 MG/2ML IJ SOLN
INTRAMUSCULAR | Status: AC
  Filled 2012-05-01: qty 2

## 2012-05-01 MED ORDER — FENTANYL CITRATE 0.05 MG/ML IJ SOLN
INTRAMUSCULAR | Status: AC
  Filled 2012-05-01: qty 2

## 2012-05-01 MED ORDER — HEPARIN (PORCINE) IN NACL 2-0.9 UNIT/ML-% IJ SOLN
INTRAMUSCULAR | Status: AC
  Filled 2012-05-01: qty 2000

## 2012-05-01 MED ORDER — ONDANSETRON HCL 4 MG/2ML IJ SOLN: 4.0000 mg | Freq: Four times a day (QID) | INTRAMUSCULAR | Status: DC | PRN

## 2012-05-01 MED ORDER — FAMOTIDINE IN NACL 20-0.9 MG/50ML-% IV SOLN
20.0000 mg | INTRAVENOUS | Status: AC
  Administered 2012-05-01: 20 mg via INTRAVENOUS
  Filled 2012-05-01: qty 50

## 2012-05-01 MED ORDER — ACETAMINOPHEN 325 MG PO TABS: 650.0000 mg | ORAL_TABLET | ORAL | Status: DC | PRN

## 2012-05-01 MED ORDER — SODIUM CHLORIDE 0.9 % IV SOLN: 1.0000 mL/kg/h | INTRAVENOUS | Status: DC

## 2012-05-01 MED ORDER — OXYCODONE-ACETAMINOPHEN 5-325 MG PO TABS: 1.0000 | ORAL_TABLET | ORAL | Status: DC | PRN

## 2012-05-01 MED ORDER — DIPHENHYDRAMINE HCL 50 MG/ML IJ SOLN
25.0000 mg | INTRAMUSCULAR | Status: AC
  Administered 2012-05-01: 25 mg via INTRAVENOUS
  Filled 2012-05-01: qty 1

## 2012-05-01 MED ORDER — METHYLPREDNISOLONE SODIUM SUCC 125 MG IJ SOLR
125.0000 mg | INTRAMUSCULAR | Status: AC
  Administered 2012-05-01: 125 mg via INTRAVENOUS
  Filled 2012-05-01: qty 2

## 2012-05-01 NOTE — Discharge Instructions (Signed)

## 2012-05-01 NOTE — CV Procedure (Addendum)
CARDIAC CATHETERIZATION REPORT   Procedures performed:  1. Left heart catheterization  2. Selective coronary angiography  3. Selective angiography of LIMA bypass to LAD and SVG to OM and sequential SVG to branches of RCA 4. Left ventriculography   Reason for procedure:  CAD s/p CABG Stable angina pectoris Congestive heart failure  Procedure performed by: Thurmon Fair, MD, [facility]  Complications: none   Estimated blood loss: less than 5 mL   History:  Recurrent exertional angina, 4 years s/p CABG.  Consent: The risks, benefits, and details of the procedure were explained to the patient. Risks including death, MI, stroke, bleeding, limb ischemia, renal failure and allergy were described and accepted by the patient. Informed written consent was obtained prior to proceeding.  Technique: The patient was brought to the cardiac catheterization laboratory in the fasting state. He was prepped and draped in the usual sterile fashion. Local anesthesia with 1% lidocaine was administered to the right groin area. Using the modified Seldinger technique a 5 French right common femoral artery sheath was introduced without difficulty. Under fluoroscopic guidance, using 5 [place] JL4, JR, IMA and angled pigtail catheters, selective cannulation of the left coronary artery, right coronary artery, LIMA and SVG x 2 and left ventricle were respectively performed. The JR catheter was used for both SVG bypasses. Several coronary angiograms in a variety of projections were recorded, as well as a left ventriculogram in the RAO projection. Left ventricular pressure and a pull back to the aorta were recorded. No immediate complications occurred. At the end of the procedure, all catheters were removed. After the procedure, hemostasis will be achieved with manual pressure.  Contrast used: 125 mL Omnipaque Medications: Versed 2 mg IV, fentanyl 25 mcg IV  Angiographic Findings:  1. The left main coronary artery is free of  significant atherosclerosis and bifurcates in the usual fashion into the left anterior descending artery and left circumflex coronary artery.  2. The left anterior descending artery is severely diseased with a ostial 90% stenosis and is totally occluded following the first diagonal and septal branches. The bifurcating diagonal artery is relatively long, but is diffusely diseased and has a fairly small lumen. 3. The left circumflex coronary artery is a large-size vessel non dominant vessel that is diseased in its mid portion, before the origin of the OM artery. The OM artery is proximally occluded. 4. The right coronary artery is a large-size dominant vessel that generates a large posterior lateral ventricular system as well as the PDA. There is evidence of extensive luminal irregularities and moderate calcification. A 60% proximal stenosis anda 70% irregular/eccentric distal stenosis are noted, but there is excellent distal flow and competitive flow from the SVG bypass.  5. The left ventricle is normal in size. The left ventricle systolic function is normal with an estimated ejection fraction of 55%. Regional wall motion abnormalities are seen: the mid anterior wall segments are hypokinetic, as is the apex. No left ventricular thrombus is seen. There is no mitral insufficiency. The ascending aorta appears normal. There is no aortic valve stenosis by pullback. The left ventricular end-diastolic pressure is moderately elevated at 25 mm Hg, mostly due to a prominent "a" kick. 6. The LIMA bypass to the mid LAD is widely patent and healthy, but the downstream LAD artery is diffusely diseased, without a dominant focal stenosis. 7. The SVG to the OM artery is healthy and widely patent. The OM artery is small, without focal stenoses 8. The sequential SVG to the inferior wall is  healthy and both limbs are widely patent. Scattered intermediate lesions are seen in the PLV artery.   IMPRESSIONS:  The bypasses are all  widely patent. There is likely ischemia in the mid anterior wall due to a severe/subtotal occlusion of the LAD artery and diffuse diagonal artery disease. The anatomy does not appear favorable for percutaneous revascularization. LV systolic function is preserved. RECOMMENDATION:  Medical therapy.

## 2012-05-01 NOTE — H&P (Signed)
  Date of Initial H&P: 04/24/2012  History reviewed, patient examined, no change in status, stable for surgery. Thurmon Fair, MD, [facility] [phone] office [phone] pager 05/01/2012 11:13 AM

## 2012-05-02 LAB — GLUCOSE, CAPILLARY: Glucose-Capillary: 97 mg/dL (ref 70–99)

## 2012-06-05 ENCOUNTER — Encounter: Payer: Self-pay | Admitting: *Deleted

## 2012-07-30 ENCOUNTER — Ambulatory Visit (HOSPITAL_COMMUNITY): Payer: Self-pay | Admitting: Dentistry

## 2012-07-30 ENCOUNTER — Encounter (HOSPITAL_COMMUNITY): Payer: Self-pay | Admitting: Dentistry

## 2012-07-30 VITALS — BP 134/86 | HR 75

## 2012-07-30 DIAGNOSIS — K1370 Unspecified lesions of oral mucosa: Secondary | ICD-10-CM

## 2012-07-30 DIAGNOSIS — Z463 Encounter for fitting and adjustment of dental prosthetic device: Secondary | ICD-10-CM

## 2012-07-30 DIAGNOSIS — K062 Gingival and edentulous alveolar ridge lesions associated with trauma: Secondary | ICD-10-CM

## 2012-07-30 DIAGNOSIS — K08109 Complete loss of teeth, unspecified cause, unspecified class: Secondary | ICD-10-CM

## 2012-07-30 NOTE — Progress Notes (Signed)
07/30/2012  Patient:            [name] Date of Birth:  February 23, 1951 MRN:                [id number]  BP 134/86  Pulse 75  [name] is a 61 year old male that presents for periodic oral examination and evaluation of upper lower complete denture. He is complaining of some looseness of the lower complete denture.  Patient indicates that he was recently diagnosed with prostate cancer. Patient received radiation therapy with Dr. Kathrynn Running. Patient indicates that he currently is having no problems with his prostate cancer.  Past Medical History  Diagnosis Date  . Myocardial infarction   . Hypertension   . S/P CABG x 4   . ST elevation MI (STEMI) 02-10-2008    S/P CABG  . Hyperlipidemia   . Mixed dyslipidemia   . Chronic venous insufficiency LOWER EXTREMITIES  . Coronary artery disease CARDIOLOGIST - DR  [id number]  LAST 1 WK AGO -- WILL REQUEST NOTE AND STRESS TEST  . Urinary hesitancy   . Hyperglycemia   . Nocturia   . Prostate cancer 08/18/11    gleason 8, volume 24.4cc    Allergies  Allergen Reactions  . Penicillins Other (See Comments)    Reaction unknown   Current Outpatient Prescriptions  Medication Sig Dispense Refill  . aspirin 81 MG EC tablet Take 81 mg by mouth daily.       . Cyanocobalamin (VITAMIN B-12 PO) Take 1 tablet by mouth daily.      Marland Kitchen ibuprofen (ADVIL,MOTRIN) 200 MG tablet Take 400 mg by mouth 2 (two) times daily as needed. For pain      . megestrol (MEGACE) 40 MG tablet Take 20 mg by mouth 2 (two) times daily.      . metoprolol tartrate (LOPRESSOR) 25 MG tablet Take 25 mg by mouth 2 (two) times daily.       . pravastatin (PRAVACHOL) 40 MG tablet Take 80 mg by mouth every evening.       . Tamsulosin HCl (FLOMAX) 0.4 MG CAPS Take 0.4 mg by mouth daily after supper.        DENTAL EXAM: General: Patient is a well-developed, well-nourished male in no acute distress. Vitals: As above. Extraoral Exam:  No palpable lymphadenopathy.  No TMJ  Symptoms. Intraoral  Exam: Patient has normal saliva. There are no signs of denture irritation. Dentition: Edentulous. Prosthodontic:  Upper and lower dentures are stable and retentive. Pressure indicating paste was applied to dentures and adjustments made as needed. Dentures were polished. Patient accepts results. Occlusion: Occlusion was evaluated and centric relation and protrusive strokes. Minimal adjustments were made. Patient accepts results.   Plan: 1. return to clinic in one year for denture recall. Patient is to call if he decides that he wishes to proceed with upper and lower complete denture relines. Quote was provided.  Charlynne Pander 07/30/2012

## 2013-06-21 ENCOUNTER — Other Ambulatory Visit: Payer: Self-pay | Admitting: Cardiovascular Disease

## 2013-07-29 ENCOUNTER — Ambulatory Visit (HOSPITAL_COMMUNITY): Payer: Self-pay | Admitting: Dentistry

## 2013-07-30 ENCOUNTER — Encounter (HOSPITAL_COMMUNITY): Payer: Self-pay | Admitting: Dentistry

## 2013-08-04 ENCOUNTER — Ambulatory Visit (HOSPITAL_COMMUNITY): Payer: Self-pay | Admitting: Dentistry

## 2013-09-14 ENCOUNTER — Encounter: Payer: Self-pay | Admitting: *Deleted

## 2013-09-19 ENCOUNTER — Ambulatory Visit: Payer: Self-pay | Admitting: Cardiovascular Disease

## 2013-09-22 ENCOUNTER — Encounter: Payer: Self-pay | Admitting: Cardiovascular Disease

## 2013-09-23 ENCOUNTER — Encounter: Payer: Self-pay | Admitting: Cardiovascular Disease

## 2013-09-23 ENCOUNTER — Ambulatory Visit (INDEPENDENT_AMBULATORY_CARE_PROVIDER_SITE_OTHER): Payer: No Typology Code available for payment source | Admitting: Cardiovascular Disease

## 2013-09-23 VITALS — BP 132/72 | HR 66 | Resp 16 | Ht 70.5 in | Wt 249.7 lb

## 2013-09-23 DIAGNOSIS — E782 Mixed hyperlipidemia: Secondary | ICD-10-CM

## 2013-09-23 DIAGNOSIS — E669 Obesity, unspecified: Secondary | ICD-10-CM

## 2013-09-23 DIAGNOSIS — Z79899 Other long term (current) drug therapy: Secondary | ICD-10-CM

## 2013-09-23 DIAGNOSIS — Z720 Tobacco use: Secondary | ICD-10-CM | POA: Insufficient documentation

## 2013-09-23 DIAGNOSIS — I2510 Atherosclerotic heart disease of native coronary artery without angina pectoris: Secondary | ICD-10-CM | POA: Insufficient documentation

## 2013-09-23 DIAGNOSIS — E1169 Type 2 diabetes mellitus with other specified complication: Secondary | ICD-10-CM | POA: Insufficient documentation

## 2013-09-23 DIAGNOSIS — F17200 Nicotine dependence, unspecified, uncomplicated: Secondary | ICD-10-CM

## 2013-09-23 DIAGNOSIS — E785 Hyperlipidemia, unspecified: Secondary | ICD-10-CM | POA: Insufficient documentation

## 2013-09-23 DIAGNOSIS — E668 Other obesity: Secondary | ICD-10-CM | POA: Insufficient documentation

## 2013-09-23 NOTE — Assessment & Plan Note (Signed)
>>  ASSESSMENT AND PLAN FOR HYPERLIPIDEMIA WRITTEN ON 09/23/2013  9:40 AM BY CROITORU, MIHAI, MD  Resume pravastatin, recheck labs before the endo of the year.

## 2013-09-23 NOTE — Assessment & Plan Note (Addendum)
Currently asymptomatic except dyspnea with greater than usual activity (carrying boxes of tile up the stairs). Not requiring nitrates. Has an area of potential ischemia in the proximal LAD-1st diagonal, downstream of 90% ostial LAD stenosis, upstream of LIMA anastomosis. Can take PDE inhibitors for ED, but warned to avoid synchronous use of nitrates. Also not sure they would help since he has had prostate brachytherapy.

## 2013-09-23 NOTE — Assessment & Plan Note (Signed)
Resume pravastatin, recheck labs before the endo of the year.

## 2013-09-23 NOTE — Assessment & Plan Note (Signed)
He is still trying to "cut down" - he is encouraged to quit altogether. Not ready to make a firm commitment.

## 2013-09-23 NOTE — Patient Instructions (Addendum)
Have lab work done at [facility] in December 2014 FASTING.  Your physician recommends that you schedule a follow-up appointment in: One Year.

## 2013-09-23 NOTE — Assessment & Plan Note (Signed)
Prominent abdominal obesity is a poor prognostic indicator - he really needs to focus on losing weight and improving his diet. Has borderline DM and very low HDL, both related to abdominal obesity/insulin resistance.

## 2013-09-23 NOTE — Progress Notes (Signed)
Patient ID: [name], male   DOB: Mar 09, 1951, 62 y.o.   MRN: [id number]     Reason for office visit CAD f/u  Generally feels great. Married in June and took a honeymoon trip to [facility]. Works in [employer] development [employer] and has to frequently exert himself quite intensely. No angina, only occasional dyspnea.  Erectile dysfunction problems are persistent - now using injections/pump. Also had problems with urgency-incontinence, better with Vesicare.  Still smoking. "Trying to cut back". Ran out of pravastatin recently, wondered what it was for. We reviewed the fact that stain therapy and smoking cessation are the two most important long term interventions to prevent MI/reduce need for future revascularization procedures.   Allergies  Allergen Reactions  . Oysters [Shellfish Allergy] Swelling    hands  . Penicillins Other (See Comments)    Reaction unknown    Current Outpatient Prescriptions  Medication Sig Dispense Refill  . aspirin 81 MG EC tablet Take 81 mg by mouth daily.       . fish oil-omega-3 fatty acids 1000 MG capsule Take 2 g by mouth daily.      Marland Kitchen ibuprofen (ADVIL,MOTRIN) 200 MG tablet Take 400 mg by mouth 2 (two) times daily as needed. For pain      . metoprolol tartrate (LOPRESSOR) 25 MG tablet Take 12.5 mg by mouth 2 (two) times daily.       . nitroGLYCERIN (NITROSTAT) 0.4 MG SL tablet Place 0.4 mg under the tongue every 5 (five) minutes as needed for chest pain.      . pravastatin (PRAVACHOL) 40 MG tablet TAKE TWO TABLETS BY MOUTH EVERY DAY  60 tablet  2  . solifenacin (VESICARE) 5 MG tablet Take 10 mg by mouth daily.      . Tamsulosin HCl (FLOMAX) 0.4 MG CAPS Take 0.4 mg by mouth daily after supper.       No current facility-administered medications for this visit.    Past Medical History  Diagnosis Date  . Myocardial infarction   . Hypertension   . S/P CABG x 4   . ST elevation MI (STEMI) 02-10-2008    S/P CABG  . Hyperlipidemia   . Mixed dyslipidemia   .  Chronic venous insufficiency LOWER EXTREMITIES  . Coronary artery disease CARDIOLOGIST - DR  [id number]-  LAST 1 WK AGO -- WILL REQUEST NOTE AND STRESS TEST  . Urinary hesitancy   . Hyperglycemia   . Nocturia   . Prostate cancer 08/18/11    gleason 8, volume 24.4cc    Past Surgical History  Procedure Laterality Date  . Shoulder surgery  1992    LEFT  . Knee surgery  age 73    RIGHT  . Multiple teeth extractions (23)/ four quadrant alveolplasty/ mandibular lateral exostoses reductions  03-24-2010    CHRONIC PERIODONTITIS  . Coronary artery bypass graft  02-10-2008  DR Jacinto Halim    X4 VESSEL DISEASE / [facility] CABG  . Cardiovascular stress test  03-15-2010    INFERIOR WALL SCAR WITHOUT ANY MEANINGFUL ISCHEMIA/ EF 46% / LOW RISK SCAN  . Radioactive seed implant  02/08/2012    Procedure: RADIOACTIVE SEED IMPLANT;  Surgeon: Marcine Matar, MD;  [facility]: [facility];  Service: Urology;  Laterality: N/A;  C-ARM   . Cystoscopy  02/08/2012    Procedure: CYSTOSCOPY FLEXIBLE;  Surgeon: Marcine Matar, MD;  Location: [facility];  Service: Urology;;  . Cardiac catheterization  05/01/2012    grafts widely patent  Family History  Problem Relation Age of Onset  . Cancer Brother     pancreatic    History   Social History  . Marital Status: Divorced    Spouse Name: N/A    Number of Children: N/A  . Years of Education: N/A   Occupational History  . Not on file.   Social History Main Topics  . Smoking status: Current Every Day Smoker -- 1.00 packs/day for 50 years    Types: Cigarettes  . Smokeless tobacco: Never Used     Comment: PREVIOUSLY SMOKED 3PPD / DECREASED TO 1PPD SINCE 2009  . Alcohol Use: Yes     Comment: occassional  . Drug Use: No  . Sexual Activity:    Other Topics Concern  . Not on file   Social History Narrative  . No narrative on file    Review of systems: The patient specifically denies any chest pain at rest or with exertion,  dyspnea at rest or with exertion, orthopnea, paroxysmal nocturnal dyspnea, syncope, palpitations, focal neurological deficits, intermittent claudication, lower extremity edema, unexplained weight gain, cough, hemoptysis or wheezing.  The patient also denies abdominal pain, nausea, vomiting, dysphagia, diarrhea, constipation, polyuria, polydipsia, dysuria, hematuria, frequency, urgency, abnormal bleeding or bruising, fever, chills, unexpected weight changes, mood swings, change in skin or hair texture, change in voice quality, auditory or visual problems, allergic reactions or rashes, new musculoskeletal complaints other than usual "aches and pains".   PHYSICAL EXAM BP 132/72  Pulse 66  Resp 16  Ht 5' 10.5" (1.791 m)  Wt 249 lb 11.2 oz (113.263 kg)  BMI 35.31 kg/m2  General: Alert, oriented x3, no distress Head: no evidence of trauma, PERRL, EOMI, no exophtalmos or lid lag, no myxedema, no xanthelasma; normal ears, nose and oropharynx Neck: normal jugular venous pulsations and no hepatojugular reflux; brisk carotid pulses without delay and no carotid bruits Chest: clear to auscultation, no signs of consolidation by percussion or palpation, normal fremitus, symmetrical and full respiratory excursions Cardiovascular: normal position and quality of the apical impulse, regular rhythm, normal first and second heart sounds, no murmurs, rubs or gallops Abdomen: no tenderness or distention, no masses by palpation, no abnormal pulsatility or arterial bruits, normal bowel sounds, no hepatosplenomegaly Extremities: no clubbing, cyanosis or edema; 2+ radial, ulnar and brachial pulses bilaterally; 2+ right femoral, posterior tibial and dorsalis pedis pulses; 2+ left femoral, posterior tibial and dorsalis pedis pulses; no subclavian or femoral bruits Neurological: grossly nonfocal   EKG: NSR, inferior Q waves, precordial mild T wave inversion (seen occasionally on previous tracings)  Lipid Panel  TC 83,  TG 73, HDL 27, LDL 41, Hgb A1c 6.1% (May 1478)  BMET    Component Value Date/Time   NA 140 02/01/2012 1500   K 4.4 02/01/2012 1500   CL 104 02/01/2012 1500   CO2 30 02/01/2012 1500   GLUCOSE 139* 02/01/2012 1500   BUN 14 02/01/2012 1500   CREATININE 0.65 02/01/2012 1500   CALCIUM 9.6 02/01/2012 1500   GFRNONAA >90 02/01/2012 1500   GFRAA >90 02/01/2012 1500     ASSESSMENT AND PLAN CAD - inferior MI 2009, CABG 2009, patent grafts cath 04/2012 Currently asymptomatic except dyspnea with greater than usual activity (carrying boxes of tile up the stairs). Not requiring nitrates. Has an area of potential ischemia in the proximal LAD-1st diagonal, downstream of 90% ostial LAD stenosis, upstream of LIMA anastomosis. Can take PDE inhibitors for ED, but warned to avoid synchronous use of nitrates. Also not  sure they would help since he has had prostate brachytherapy.  Hyperlipidemia Resume pravastatin, recheck labs before the endo of the year.  Tobacco abuse He is still trying to "cut down" - he is encouraged to quit altogether. Not ready to make a firm commitment.   Obesity Prominent abdominal obesity is a poor prognostic indicator - he really needs to focus on losing weight and improving his diet. Has borderline DM and very low HDL, both related to abdominal obesity/insulin resistance.   Orders Placed This Encounter  Procedures  . Lipid Profile  . Comp Met (CMET)  . EKG 12-Lead   Meds ordered this encounter  Medications  . solifenacin (VESICARE) 5 MG tablet    Sig: Take 10 mg by mouth daily.    Junious Silk, MD, [facility] [phone] office [phone] pager

## 2013-11-17 ENCOUNTER — Other Ambulatory Visit: Payer: Self-pay | Admitting: *Deleted

## 2013-11-17 MED ORDER — METOPROLOL TARTRATE 25 MG PO TABS
12.5000 mg | ORAL_TABLET | Freq: Two times a day (BID) | ORAL | Status: DC
Start: 2013-11-17 — End: 2013-12-04

## 2013-12-03 ENCOUNTER — Other Ambulatory Visit: Payer: Self-pay | Admitting: Urology

## 2013-12-04 ENCOUNTER — Encounter (HOSPITAL_COMMUNITY): Payer: Self-pay | Admitting: Pharmacy Technician

## 2013-12-05 ENCOUNTER — Encounter (HOSPITAL_COMMUNITY): Payer: Self-pay | Admitting: *Deleted

## 2013-12-08 ENCOUNTER — Encounter (HOSPITAL_COMMUNITY): Payer: Self-pay | Admitting: *Deleted

## 2013-12-08 ENCOUNTER — Ambulatory Visit (HOSPITAL_COMMUNITY)
Admission: RE | Admit: 2013-12-08 | Discharge: 2013-12-08 | Disposition: A | Discharge status: Home / Self Care | Payer: No Typology Code available for payment source | Source: Ambulatory Visit | Attending: Urology | Admitting: Urology

## 2013-12-08 ENCOUNTER — Encounter (HOSPITAL_COMMUNITY)
Admission: RE | Disposition: A | Discharge status: Home / Self Care | Payer: Self-pay | Source: Ambulatory Visit | Attending: Urology

## 2013-12-08 ENCOUNTER — Ambulatory Visit (HOSPITAL_COMMUNITY): Payer: No Typology Code available for payment source

## 2013-12-08 DIAGNOSIS — I252 Old myocardial infarction: Secondary | ICD-10-CM | POA: Insufficient documentation

## 2013-12-08 DIAGNOSIS — E785 Hyperlipidemia, unspecified: Secondary | ICD-10-CM | POA: Insufficient documentation

## 2013-12-08 DIAGNOSIS — N200 Calculus of kidney: Secondary | ICD-10-CM | POA: Insufficient documentation

## 2013-12-08 DIAGNOSIS — I10 Essential (primary) hypertension: Secondary | ICD-10-CM | POA: Insufficient documentation

## 2013-12-08 DIAGNOSIS — Z951 Presence of aortocoronary bypass graft: Secondary | ICD-10-CM | POA: Insufficient documentation

## 2013-12-08 DIAGNOSIS — I2510 Atherosclerotic heart disease of native coronary artery without angina pectoris: Secondary | ICD-10-CM | POA: Insufficient documentation

## 2013-12-08 DIAGNOSIS — Z79899 Other long term (current) drug therapy: Secondary | ICD-10-CM | POA: Insufficient documentation

## 2013-12-08 DIAGNOSIS — Z8546 Personal history of malignant neoplasm of prostate: Secondary | ICD-10-CM | POA: Insufficient documentation

## 2013-12-08 DIAGNOSIS — Z7982 Long term (current) use of aspirin: Secondary | ICD-10-CM | POA: Insufficient documentation

## 2013-12-08 SURGERY — LITHOTRIPSY, ESWL
Anesthesia: LOCAL | Laterality: Right

## 2013-12-08 MED ORDER — LEVOFLOXACIN 500 MG PO TABS
500.0000 mg | ORAL_TABLET | ORAL | Status: AC
  Administered 2013-12-08: 500 mg via ORAL
  Filled 2013-12-08: qty 1

## 2013-12-08 MED ORDER — OXYCODONE-ACETAMINOPHEN 5-325 MG PO TABS: 1.0000 | ORAL_TABLET | ORAL | Status: DC | PRN

## 2013-12-08 MED ORDER — DIPHENHYDRAMINE HCL 25 MG PO CAPS
25.0000 mg | ORAL_CAPSULE | ORAL | Status: AC
  Administered 2013-12-08: 25 mg via ORAL
  Filled 2013-12-08: qty 1

## 2013-12-08 MED ORDER — DEXTROSE-NACL 5-0.45 % IV SOLN
INTRAVENOUS | Status: DC
  Administered 2013-12-08: 1000 mL via INTRAVENOUS

## 2013-12-08 MED ORDER — DIAZEPAM 5 MG PO TABS
10.0000 mg | ORAL_TABLET | ORAL | Status: AC
  Administered 2013-12-08: 10 mg via ORAL
  Filled 2013-12-08: qty 2

## 2013-12-08 NOTE — H&P (Signed)
Urology History and Physical Exam  CC: Kidney stone  HPI: 62 year old male presents for ESL of a symptomatic 10 mm proximal right ureteral stone.  PMH: Past Medical History  Diagnosis Date  . Myocardial infarction   . Hypertension   . S/P CABG x 4   . ST elevation MI (STEMI) 02-10-2008    S/P CABG  . Hyperlipidemia   . Mixed dyslipidemia   . Chronic venous insufficiency LOWER EXTREMITIES  . Coronary artery disease CARDIOLOGIST - DR  [id number]-  LAST 1 WK AGO -- WILL REQUEST NOTE AND STRESS TEST  . Urinary hesitancy   . Hyperglycemia   . Nocturia   . Prostate cancer 08/18/11    gleason 8, volume 24.4cc    PSH: Past Surgical History  Procedure Laterality Date  . Shoulder surgery  1992    LEFT  . Knee surgery  age 68    RIGHT  . Multiple teeth extractions (23)/ four quadrant alveolplasty/ mandibular lateral exostoses reductions  03-24-2010    CHRONIC PERIODONTITIS  . Coronary artery bypass graft  02-10-2008  DR Jacinto Halim    X4 VESSEL DISEASE / [facility] CABG  . Cardiovascular stress test  03-15-2010    INFERIOR WALL SCAR WITHOUT ANY MEANINGFUL ISCHEMIA/ EF 46% / LOW RISK SCAN  . Radioactive seed implant  02/08/2012    Procedure: RADIOACTIVE SEED IMPLANT;  Surgeon: Marcine Matar, MD;  Location: [facility];  Service: Urology;  Laterality: N/A;  C-ARM   . Cystoscopy  02/08/2012    Procedure: CYSTOSCOPY FLEXIBLE;  Surgeon: Marcine Matar, MD;  Location: [facility];  Service: Urology;;  . Cardiac catheterization  05/01/2012    grafts widely patent    Allergies: Allergies  Allergen Reactions  . Oysters [Shellfish Allergy] Swelling    hands  . Penicillins Other (See Comments)    Reaction unknown    Medications: Prescriptions prior to admission  Medication Sig Dispense Refill  . metoprolol tartrate (LOPRESSOR) 25 MG tablet Take 12.5 mg by mouth 2 (two) times daily.      . pravastatin (PRAVACHOL) 40 MG tablet Take 80 mg by mouth daily  with breakfast.      . aspirin 81 MG EC tablet Take 81 mg by mouth daily.       Marland Kitchen ibuprofen (ADVIL,MOTRIN) 200 MG tablet Take 400 mg by mouth 2 (two) times daily as needed. For pain      . nitroGLYCERIN (NITROSTAT) 0.4 MG SL tablet Place 0.4 mg under the tongue every 5 (five) minutes as needed for chest pain.         Social History: History   Social History  . Marital Status: Married    Spouse Name: N/A    Number of Children: N/A  . Years of Education: N/A   Occupational History  . Not on file.   Social History Main Topics  . Smoking status: Current Every Day Smoker -- 1.00 packs/day for 50 years    Types: Cigarettes  . Smokeless tobacco: Never Used     Comment: PREVIOUSLY SMOKED 3PPD / DECREASED TO 1PPD SINCE 2009  . Alcohol Use: Yes     Comment: occassional  . Drug Use: No  . Sexual Activity:    Other Topics Concern  . Not on file   Social History Narrative  . No narrative on file    Family History: Family History  Problem Relation Age of Onset  . Cancer Brother     pancreatic  Review of Systems: Positive: Flank pain, hematuria Negative:   A further 10 point review of systems was negative except what is listed in the HPI.  Physical Exam: @VITALS2 @ General: No acute distress.  Awake. Head:  Normocephalic.  Atraumatic. ENT:  EOMI.  Mucous membranes moist Neck:  Supple.  No lymphadenopathy. CV:  S1 present. S2 present. Regular rate. Pulmonary: Equal effort bilaterally.  Clear to auscultation bilaterally. Abdomen: Soft.  Non tender to palpation. Skin:  Normal turgor.  No visible rash. Extremity: No gross deformity of bilateral upper extremities.  No gross deformity of    bilateral lower extremities. Neurologic: Alert. Appropriate mood.    Studies:  No results found for this basename: HGB, WBC, PLT,  in the last 72 hours  No results found for this basename: NA, K, CL, CO2, BUN, CREATININE, CALCIUM, MAGNESIUM, GFRNONAA, GFRAA,  in the last 72 hours    No results found for this basename: PT, INR, APTT,  in the last 72 hours   No components found with this basename: ABG,     Assessment:  Right proximal ureteral stone  Plan: ESL

## 2014-05-13 ENCOUNTER — Encounter: Payer: Self-pay | Admitting: Interventional Cardiology

## 2014-05-13 NOTE — Telephone Encounter (Signed)
New message ° ° ° ° °Want test results °

## 2014-05-13 NOTE — Telephone Encounter (Signed)
This encounter was created in error - please disregard.

## 2014-12-03 ENCOUNTER — Encounter (HOSPITAL_COMMUNITY): Payer: Self-pay | Admitting: Cardiovascular Disease

## 2014-12-25 DIAGNOSIS — I639 Cerebral infarction, unspecified: Secondary | ICD-10-CM

## 2014-12-25 HISTORY — DX: Cerebral infarction, unspecified: I63.9

## 2015-03-31 ENCOUNTER — Ambulatory Visit (INDEPENDENT_AMBULATORY_CARE_PROVIDER_SITE_OTHER): Payer: No Typology Code available for payment source

## 2015-03-31 ENCOUNTER — Ambulatory Visit (INDEPENDENT_AMBULATORY_CARE_PROVIDER_SITE_OTHER): Payer: No Typology Code available for payment source | Admitting: Podiatry

## 2015-03-31 DIAGNOSIS — M779 Enthesopathy, unspecified: Secondary | ICD-10-CM | POA: Diagnosis not present

## 2015-03-31 DIAGNOSIS — M79672 Pain in left foot: Secondary | ICD-10-CM

## 2015-03-31 DIAGNOSIS — R609 Edema, unspecified: Secondary | ICD-10-CM

## 2015-03-31 MED ORDER — METHYLPREDNISOLONE 4 MG PO KIT: PACK | ORAL | Status: DC

## 2015-03-31 MED ORDER — TRIAMCINOLONE ACETONIDE 10 MG/ML IJ SUSP
10.0000 mg | Freq: Once | INTRAMUSCULAR | Status: AC
  Administered 2015-03-31: 10 mg

## 2015-03-31 MED ORDER — KETOCONAZOLE 2 % EX CREA: 1.0000 "application " | TOPICAL_CREAM | Freq: Every day | CUTANEOUS | Status: DC

## 2015-03-31 NOTE — Progress Notes (Signed)
   Subjective:    Patient ID: [name], male    DOB: 28-Dec-1950, 64 y.o.   MRN: [id number]  HPI All over Left foot pain with swelling 3 month history     Review of Systems  All other systems reviewed and are negative.      Objective:   Physical Exam        Assessment & Plan:

## 2015-04-01 NOTE — Progress Notes (Signed)
Subjective:     Patient ID: [name], male   DOB: [birth year], 64 y.o.   MRN: [id number]  HPI patient states he developed pain in his left arch and then gradually has been walking differently and is now developed swelling in the top of his foot and into his ankle that is moderately tender when pressed. Still complains of the arch hurting him more than anything else   Review of Systems  All other systems reviewed and are negative.      Objective:   Physical Exam  Constitutional: He is oriented to person, place, and time.  Cardiovascular: Intact distal pulses.   Musculoskeletal: Normal range of motion.  Neurological: He is oriented to person, place, and time.  Skin: Skin is warm.  Nursing note and vitals reviewed.  neurovascular status intact with muscle strength adequate and range of motion subtalar and midtarsal joint within normal limits. Patient's noted to have pain in the distal portion of the left arch for it inserts into the first metatarsal head foot and is noted to have moderate discomfort on the dorsum of the left foot. There is +1 pitting edema with negative Homans sign noted and it appears to be more from change in gait which is creating stress on his remaining foot     Assessment:     Distal plantar fasciitis left with probable compensatory tendinitis and edema of the foot    Plan:     H&P and conditions explained to patient. Reviewed x-rays with patient and today did distal injection 3 mg Kenalog 5 mg Xylocaine into the fascia and went ahead and applied a Unna boot with Ace wrap and surgical shoe. He will leave this on for days and less it causes any distal digital compression where he will take it off immediately and he'll be seen back in 1 week

## 2015-04-07 ENCOUNTER — Ambulatory Visit (INDEPENDENT_AMBULATORY_CARE_PROVIDER_SITE_OTHER): Payer: No Typology Code available for payment source | Admitting: Podiatry

## 2015-04-07 DIAGNOSIS — M778 Other enthesopathies, not elsewhere classified: Secondary | ICD-10-CM

## 2015-04-07 DIAGNOSIS — R609 Edema, unspecified: Secondary | ICD-10-CM

## 2015-04-07 DIAGNOSIS — M7751 Other enthesopathy of right foot: Secondary | ICD-10-CM | POA: Diagnosis not present

## 2015-04-07 DIAGNOSIS — M779 Enthesopathy, unspecified: Secondary | ICD-10-CM

## 2015-04-07 MED ORDER — TRIAMCINOLONE ACETONIDE 10 MG/ML IJ SUSP
10.0000 mg | Freq: Once | INTRAMUSCULAR | Status: AC
  Administered 2015-04-07: 10 mg

## 2015-04-07 NOTE — Progress Notes (Signed)
Subjective:     Patient ID: [name], male   DOB: 1951-03-15, 64 y.o.   MRN: [id number]  HPI patient states my left arch and swelling is better but I'm getting a lot of pain in the ankle left. States that has become more apparent since the other problem is better   Review of Systems     Objective:   Physical Exam Neurovascular status intact muscle strength adequate with quite a bit of discomfort in the left sinus tarsi with fluid buildup noted. Also noted to have moderate depression of the arch and still slight discomfort noted with no edema noted presently    Assessment:     Sinus tarsitis left which is probably due to change in gait along with continued inflammation plantar    Plan:     Injected the sinus tarsi left 3 mg Kenalog 5 mill grams Xylocaine and placed in fascially brace to lift the arch up. Reappoint for [place] to recheck again in 3 weeks or earlier if necessary

## 2015-04-22 ENCOUNTER — Encounter: Payer: Self-pay | Admitting: Podiatry

## 2015-04-22 ENCOUNTER — Ambulatory Visit (INDEPENDENT_AMBULATORY_CARE_PROVIDER_SITE_OTHER): Payer: No Typology Code available for payment source | Admitting: Podiatry

## 2015-04-22 VITALS — BP 142/86 | HR 80 | Resp 12

## 2015-04-22 DIAGNOSIS — M722 Plantar fascial fibromatosis: Secondary | ICD-10-CM

## 2015-04-22 DIAGNOSIS — R609 Edema, unspecified: Secondary | ICD-10-CM

## 2015-04-22 MED ORDER — TRIAMCINOLONE ACETONIDE 10 MG/ML IJ SUSP
10.0000 mg | Freq: Once | INTRAMUSCULAR | Status: AC
  Administered 2015-04-22: 10 mg

## 2015-04-22 MED ORDER — PREDNISONE 10 MG PO TABS: ORAL_TABLET | ORAL | Status: DC

## 2015-04-22 NOTE — Progress Notes (Signed)
Subjective:     Patient ID: [name], male   DOB: January 03, 1951, 64 y.o.   MRN: [id number]  HPI patient presents stating my ankle is doing well but the pain has returned with significant nature in my left plantar arch and is increasingly hard for me to bear any weight on. I also have quite a bit of swelling   Review of Systems     Objective:   Physical Exam Neurovascular status intact muscle strength adequate with severe discomfort in the medial distal plantar fascia. There also is quite a bit of edema in the foot itself with a negative Homans sign noted and mild discomfort still in the sinus tarsi    Assessment:     Distal medial plantar fasciitis left with inflammatory inflammation and edema of the foot which may be due to change in gait    Plan:     Due to inability to bear weight on the foot I did go ahead today and recommended immobilization with air fracture walker. I did first inject into the distal medial plantar fascia 3 mg Kenalog 5 mg Xylocaine and then applied Unna boot in order to try to reduce the pressure on his foot. I placed an air fracture walker which was tolerated well and patient be seen back to recheck in 3 weeks and did place on a 12 a steroid pack to try to reduce the acute inflammatory process

## 2015-04-26 ENCOUNTER — Ambulatory Visit: Payer: No Typology Code available for payment source | Admitting: Podiatry

## 2015-05-05 ENCOUNTER — Telehealth: Payer: Self-pay | Admitting: *Deleted

## 2015-05-05 NOTE — Telephone Encounter (Signed)
Pt asked how long he should wear the soft cast, Dr. Paulla Dolly put him in at the last visit.  I told the pt he could take the Unna boot off, and until the next visit on 05/17/2015 ice 3-4 times a day for 10 to 15 minutes each time.  Pt agreed.

## 2015-05-13 ENCOUNTER — Ambulatory Visit: Payer: No Typology Code available for payment source | Admitting: Podiatry

## 2015-05-17 ENCOUNTER — Encounter: Payer: Self-pay | Admitting: Podiatry

## 2015-05-17 ENCOUNTER — Ambulatory Visit (INDEPENDENT_AMBULATORY_CARE_PROVIDER_SITE_OTHER): Payer: No Typology Code available for payment source

## 2015-05-17 ENCOUNTER — Ambulatory Visit (INDEPENDENT_AMBULATORY_CARE_PROVIDER_SITE_OTHER): Payer: No Typology Code available for payment source | Admitting: Podiatry

## 2015-05-17 VITALS — BP 151/86 | HR 75 | Resp 15

## 2015-05-17 DIAGNOSIS — R52 Pain, unspecified: Secondary | ICD-10-CM

## 2015-05-17 DIAGNOSIS — M779 Enthesopathy, unspecified: Secondary | ICD-10-CM | POA: Diagnosis not present

## 2015-05-17 DIAGNOSIS — M722 Plantar fascial fibromatosis: Secondary | ICD-10-CM

## 2015-05-17 DIAGNOSIS — R609 Edema, unspecified: Secondary | ICD-10-CM

## 2015-05-17 MED ORDER — TRIAMCINOLONE ACETONIDE 10 MG/ML IJ SUSP
10.0000 mg | Freq: Once | INTRAMUSCULAR | Status: AC
  Administered 2015-05-17: 10 mg

## 2015-05-17 NOTE — Progress Notes (Signed)
Subjective:     Patient ID: [name], male   DOB: 1951-08-08, 64 y.o.   MRN: [id number]  HPI patient presents stating it seems that my arch is some improved but I'm getting some pain on a different spot on top of my foot and while I can wear the boot with relative comfort when I take it off I'm still getting pain   Review of Systems     Objective:   Physical Exam Neurovascular status unchanged with good digital perfusion noted and noted to have pain in the dorsum of the left foot around the first metatarsal shaft with edema in the area. No tendon dysfunction was noted    Assessment:     Dorsal tendinitis of the left foot with fasciitis symptoms which are improving    Plan:     Advised him gradual reduction of the boot and scanned for custom orthotics to reduce stress against his feet. Careful dorsal injection administered 3 mg Kenalog 5 mill grams Xylocaine into the area of inflammation and reappoint in 3 weeks to dispense orthotics and review how he is progressing

## 2015-06-07 ENCOUNTER — Ambulatory Visit (INDEPENDENT_AMBULATORY_CARE_PROVIDER_SITE_OTHER): Payer: No Typology Code available for payment source | Admitting: Podiatry

## 2015-06-07 VITALS — BP 146/96 | HR 80 | Resp 12

## 2015-06-07 DIAGNOSIS — M722 Plantar fascial fibromatosis: Secondary | ICD-10-CM

## 2015-06-07 DIAGNOSIS — M79672 Pain in left foot: Secondary | ICD-10-CM

## 2015-06-07 MED ORDER — TRIAMCINOLONE ACETONIDE 10 MG/ML IJ SUSP
10.0000 mg | Freq: Once | INTRAMUSCULAR | Status: AC
  Administered 2015-06-07: 10 mg

## 2015-06-07 NOTE — Patient Instructions (Signed)

## 2015-06-07 NOTE — Progress Notes (Signed)
Subjective:     Patient ID: [name], male   DOB: August 17, 1951, 64 y.o.   MRN: [id number]  HPI patient presents stating I'm doing pretty well with pain in my heel and diminishment of discomfort of an intense nature but it is more achy and bothersome deformity   Review of Systems     Objective:   Physical Exam Neurovascular status intact no change in health history with discomfort in the plantar aspect of the heel more in the central and lateral band    Assessment:     Plantar fasciitis left with inflammation center and lateral band with improvement of the dorsal foot noted    Plan:     Dispensed orthotics with instructions on usage and injected the plantar fascial left 3 mg Kenalog 5 mill grams Xylocaine with instructions on physical therapy

## 2015-07-06 ENCOUNTER — Other Ambulatory Visit: Payer: Self-pay | Admitting: Podiatry

## 2016-08-10 LAB — HM DIABETES EYE EXAM

## 2016-08-11 ENCOUNTER — Emergency Department (HOSPITAL_COMMUNITY): Payer: BLUE CROSS/BLUE SHIELD

## 2016-08-11 ENCOUNTER — Encounter (INDEPENDENT_AMBULATORY_CARE_PROVIDER_SITE_OTHER): Payer: BLUE CROSS/BLUE SHIELD | Admitting: Ophthalmology

## 2016-08-11 ENCOUNTER — Encounter (HOSPITAL_COMMUNITY): Payer: Self-pay | Admitting: *Deleted

## 2016-08-11 ENCOUNTER — Inpatient Hospital Stay (HOSPITAL_COMMUNITY)
Admission: EM | Admit: 2016-08-11 | Discharge: 2016-08-14 | DRG: 041 | Disposition: A | Discharge status: Home / Self Care | Payer: BLUE CROSS/BLUE SHIELD | Attending: Family Medicine | Admitting: Family Medicine

## 2016-08-11 DIAGNOSIS — H43813 Vitreous degeneration, bilateral: Secondary | ICD-10-CM | POA: Diagnosis not present

## 2016-08-11 DIAGNOSIS — E1142 Type 2 diabetes mellitus with diabetic polyneuropathy: Secondary | ICD-10-CM | POA: Diagnosis present

## 2016-08-11 DIAGNOSIS — E119 Type 2 diabetes mellitus without complications: Secondary | ICD-10-CM

## 2016-08-11 DIAGNOSIS — E1169 Type 2 diabetes mellitus with other specified complication: Secondary | ICD-10-CM

## 2016-08-11 DIAGNOSIS — Z91013 Allergy to seafood: Secondary | ICD-10-CM | POA: Diagnosis not present

## 2016-08-11 DIAGNOSIS — H2513 Age-related nuclear cataract, bilateral: Secondary | ICD-10-CM

## 2016-08-11 DIAGNOSIS — H3582 Retinal ischemia: Secondary | ICD-10-CM | POA: Diagnosis present

## 2016-08-11 DIAGNOSIS — I10 Essential (primary) hypertension: Secondary | ICD-10-CM

## 2016-08-11 DIAGNOSIS — Z951 Presence of aortocoronary bypass graft: Secondary | ICD-10-CM

## 2016-08-11 DIAGNOSIS — I2510 Atherosclerotic heart disease of native coronary artery without angina pectoris: Secondary | ICD-10-CM | POA: Diagnosis present

## 2016-08-11 DIAGNOSIS — Z7982 Long term (current) use of aspirin: Secondary | ICD-10-CM | POA: Diagnosis not present

## 2016-08-11 DIAGNOSIS — I639 Cerebral infarction, unspecified: Secondary | ICD-10-CM | POA: Diagnosis present

## 2016-08-11 DIAGNOSIS — Z79899 Other long term (current) drug therapy: Secondary | ICD-10-CM | POA: Diagnosis not present

## 2016-08-11 DIAGNOSIS — E1165 Type 2 diabetes mellitus with hyperglycemia: Secondary | ICD-10-CM | POA: Diagnosis present

## 2016-08-11 DIAGNOSIS — E1159 Type 2 diabetes mellitus with other circulatory complications: Secondary | ICD-10-CM

## 2016-08-11 DIAGNOSIS — H53139 Sudden visual loss, unspecified eye: Secondary | ICD-10-CM

## 2016-08-11 DIAGNOSIS — G608 Other hereditary and idiopathic neuropathies: Secondary | ICD-10-CM | POA: Diagnosis not present

## 2016-08-11 DIAGNOSIS — Z720 Tobacco use: Secondary | ICD-10-CM

## 2016-08-11 DIAGNOSIS — I63442 Cerebral infarction due to embolism of left cerebellar artery: Secondary | ICD-10-CM

## 2016-08-11 DIAGNOSIS — Z91030 Bee allergy status: Secondary | ICD-10-CM | POA: Diagnosis not present

## 2016-08-11 DIAGNOSIS — I679 Cerebrovascular disease, unspecified: Secondary | ICD-10-CM

## 2016-08-11 DIAGNOSIS — I6330 Cerebral infarction due to thrombosis of unspecified cerebral artery: Secondary | ICD-10-CM | POA: Diagnosis not present

## 2016-08-11 DIAGNOSIS — E669 Obesity, unspecified: Secondary | ICD-10-CM | POA: Diagnosis present

## 2016-08-11 DIAGNOSIS — H53131 Sudden visual loss, right eye: Secondary | ICD-10-CM | POA: Diagnosis not present

## 2016-08-11 DIAGNOSIS — I252 Old myocardial infarction: Secondary | ICD-10-CM

## 2016-08-11 DIAGNOSIS — D3131 Benign neoplasm of right choroid: Secondary | ICD-10-CM | POA: Diagnosis not present

## 2016-08-11 DIAGNOSIS — Z880 Allergy status to penicillin: Secondary | ICD-10-CM | POA: Diagnosis not present

## 2016-08-11 DIAGNOSIS — R739 Hyperglycemia, unspecified: Secondary | ICD-10-CM | POA: Diagnosis not present

## 2016-08-11 DIAGNOSIS — H3411 Central retinal artery occlusion, right eye: Secondary | ICD-10-CM

## 2016-08-11 DIAGNOSIS — E782 Mixed hyperlipidemia: Secondary | ICD-10-CM | POA: Diagnosis present

## 2016-08-11 DIAGNOSIS — Z6831 Body mass index (BMI) 31.0-31.9, adult: Secondary | ICD-10-CM | POA: Diagnosis not present

## 2016-08-11 DIAGNOSIS — F17210 Nicotine dependence, cigarettes, uncomplicated: Secondary | ICD-10-CM | POA: Diagnosis present

## 2016-08-11 DIAGNOSIS — I6789 Other cerebrovascular disease: Secondary | ICD-10-CM | POA: Diagnosis not present

## 2016-08-11 DIAGNOSIS — C61 Malignant neoplasm of prostate: Secondary | ICD-10-CM | POA: Diagnosis present

## 2016-08-11 DIAGNOSIS — I872 Venous insufficiency (chronic) (peripheral): Secondary | ICD-10-CM | POA: Diagnosis present

## 2016-08-11 DIAGNOSIS — H5461 Unqualified visual loss, right eye, normal vision left eye: Secondary | ICD-10-CM | POA: Diagnosis present

## 2016-08-11 DIAGNOSIS — R29898 Other symptoms and signs involving the musculoskeletal system: Secondary | ICD-10-CM

## 2016-08-11 DIAGNOSIS — Z809 Family history of malignant neoplasm, unspecified: Secondary | ICD-10-CM | POA: Diagnosis not present

## 2016-08-11 DIAGNOSIS — I63312 Cerebral infarction due to thrombosis of left middle cerebral artery: Secondary | ICD-10-CM | POA: Diagnosis not present

## 2016-08-11 DIAGNOSIS — I340 Nonrheumatic mitral (valve) insufficiency: Secondary | ICD-10-CM | POA: Diagnosis not present

## 2016-08-11 DIAGNOSIS — E118 Type 2 diabetes mellitus with unspecified complications: Secondary | ICD-10-CM

## 2016-08-11 DIAGNOSIS — I152 Hypertension secondary to endocrine disorders: Secondary | ICD-10-CM

## 2016-08-11 HISTORY — DX: Cerebral infarction, unspecified: I63.9

## 2016-08-11 LAB — COMPREHENSIVE METABOLIC PANEL
ALT: 29 U/L (ref 17–63)
AST: 20 U/L (ref 15–41)
Albumin: 4.1 g/dL (ref 3.5–5.0)
Alkaline Phosphatase: 107 U/L (ref 38–126)
Anion gap: 8 (ref 5–15)
BILIRUBIN TOTAL: 0.5 mg/dL (ref 0.3–1.2)
BUN: 9 mg/dL (ref 6–20)
CHLORIDE: 100 mmol/L — AB (ref 101–111)
CO2: 27 mmol/L (ref 22–32)
CREATININE: 0.71 mg/dL (ref 0.61–1.24)
Calcium: 9.5 mg/dL (ref 8.9–10.3)
Glucose, Bld: 243 mg/dL — ABNORMAL HIGH (ref 65–99)
POTASSIUM: 4.3 mmol/L (ref 3.5–5.1)
Sodium: 135 mmol/L (ref 135–145)
TOTAL PROTEIN: 7.6 g/dL (ref 6.5–8.1)

## 2016-08-11 LAB — CBC
HEMATOCRIT: 50.7 % (ref 39.0–52.0)
Hemoglobin: 17 g/dL (ref 13.0–17.0)
MCH: 28.9 pg (ref 26.0–34.0)
MCHC: 33.5 g/dL (ref 30.0–36.0)
MCV: 86.2 fL (ref 78.0–100.0)
Platelets: 198 10*3/uL (ref 150–400)
RBC: 5.88 MIL/uL — ABNORMAL HIGH (ref 4.22–5.81)
RDW: 13.7 % (ref 11.5–15.5)
WBC: 12.3 10*3/uL — ABNORMAL HIGH (ref 4.0–10.5)

## 2016-08-11 LAB — DIFFERENTIAL
BASOS PCT: 1 %
Basophils Absolute: 0.1 10*3/uL (ref 0.0–0.1)
Eosinophils Absolute: 0.3 10*3/uL (ref 0.0–0.7)
Eosinophils Relative: 2 %
LYMPHS ABS: 5.2 10*3/uL — AB (ref 0.7–4.0)
Lymphocytes Relative: 42 %
MONO ABS: 0.8 10*3/uL (ref 0.1–1.0)
MONOS PCT: 6 %
Neutro Abs: 6 10*3/uL (ref 1.7–7.7)
Neutrophils Relative %: 49 %

## 2016-08-11 LAB — I-STAT TROPONIN, ED: TROPONIN I, POC: 0.01 ng/mL (ref 0.00–0.08)

## 2016-08-11 MED ORDER — NICOTINE 21 MG/24HR TD PT24
21.0000 mg | MEDICATED_PATCH | Freq: Every day | TRANSDERMAL | Status: DC
  Administered 2016-08-11 – 2016-08-14 (×4): 21 mg via TRANSDERMAL
  Filled 2016-08-11 (×4): qty 1

## 2016-08-11 MED ORDER — ATORVASTATIN CALCIUM 40 MG PO TABS
40.0000 mg | ORAL_TABLET | Freq: Every day | ORAL | Status: DC
  Administered 2016-08-12 – 2016-08-13 (×2): 40 mg via ORAL
  Filled 2016-08-11 (×2): qty 1

## 2016-08-11 NOTE — H&P (Signed)
[facility] Admission History and Physical Service Pager: [phone]  Patient name: [name] Medical record number: [id number] Date of birth: November 21, 1951 Age: 65 y.o. Gender: male  Primary Care Provider: No PCP Per Patient Consultants: neurology Code Status: FULL  Chief Complaint: R vision loss  Assessment and Plan: [name] is a 65 y.o. male presenting with R eye vision loss. PMH is significant for inferior MI and CABG in 2009, obesity, tobacco abuse, prostate cancer.  #R frontal subacute infarct, R vision loss: Pt reports intermittent R sided vision loss x 3 days, took 391m aspirin today. Vision is returning in R eye per pt report. Pt with history of CAD, CABG, but does not take any medicines daily. Does not see any PCP. CT head showed small subacute/old infarct in the right frontal region. On exam, he has decreased visual acuity in the upper, lower, and medial aspects of his visual field in the right eye. No other focal deficits. Pt has multiple risk factors for stroke including HTN, DM, HLD, tobacco abuse, obesity. -neurology consulted, appreciate recs -[facility] -MRI/MRA ordered -carotid [place] -lipid profile -HbA1C -TSH - Check ESR/CRP -Trop negx1. No need to trend, as Pt denies chest pain and EKG without signs of ischemia/infarct. -consider adding ace-inhibitor for BP control. Permissive hypertension x 48 hours for now. -echo -ASA 3222mQD -Will start Lipitor 4069maily -cardiac monitoring -nicotine patch, continued smoking cessation counseling - Neuro checks q2hrs x 12 hours, then q4hrs. - PT/OT/SLP  #CAD: inferior MI in 2009, CABG 2009, graft patent on cath 2013. Pt reports he does not see a cardiologist regularly and does not take any medicines.  -ASA as above -consider ace inhibitor as above -Will start Lipitor 34m80mily  #Tobacco abuse Pt smokes 1 pack per day x50 years (previously smoked 3 ppd  until 2009). Reports he may be willing to cut down.  -nicotene patch -continued smoking cessation counseling  #Hyperglycemia: Glucose 243 on BMET. - A1c as above - Will likely need to start oral diabetic med this admission.  FEN/GI: heart healthy/carb-modified diet, as Pt passed his bedside swallow. Prophylaxis: SCDs  Disposition: admit to inpatient  History of Present Illness:  [name] 65 y46. male presenting with R vision loss.  Three days ago, he was riding down the street and lost vision in his right eye for 20-30 minutes. His vision spontaneously came back on its own. The next afternoon, he lost vision again. He went to an eye doctor on Thursday, who sent him to a retinal specialist ([facility]). The retinal specialist thought he was having a stroke and told him to take Aspirin 325mg71ms daughter works with a heart doctor and she called the doctor for advise. The heart doctor advised that he come to the ED. He has never had this happen before. He has never had a stroke before. When he woke up this morning, he felt like his vision was starting to come back. He is still "seeing funny colors".  No chest pain, no shortness of breath, no abdominal pain, no headaches, no weakness of arms or legs, no slurring of words, no confusion.  In the ED, labs were significant for a WBC count of 12.3 and elevated glucose to 243. EKG showed LVH but no new ST or T wave changes. CT head showed small cortical infarct in the right frontal region, favoring subacute to old in age. Pt was admitted  for stroke work-up.  Review Of Systems: Per HPI with the following additions: none. Otherwise the remainder of the systems were negative.  Patient Active Problem List   Diagnosis Date Noted  . CAD - inferior MI 2009, CABG 2009, patent grafts cath 04/2012 09/23/2013  . Obesity 09/23/2013  . Hyperlipidemia 09/23/2013  . Tobacco abuse 09/23/2013  . Prostate cancer ([place])  08/18/2011  . Myocardial infarction [facility])     Past Medical History: Past Medical History:  Diagnosis Date  . Chronic venous insufficiency LOWER EXTREMITIES  . Coronary artery disease CARDIOLOGIST - DR  [id number]  LAST 1 WK AGO -- WILL REQUEST NOTE AND STRESS TEST  . Hyperglycemia   . Hyperlipidemia   . Hypertension   . Mixed dyslipidemia   . Myocardial infarction ([place])   . Nocturia   . Prostate cancer ([place]) 08/18/11   gleason 8, volume 24.4cc  . S/P CABG x 4   . ST elevation MI (STEMI) ([place]) 02-10-2008   S/P CABG  . Urinary hesitancy     Past Surgical History: Past Surgical History:  Procedure Laterality Date  . CARDIAC CATHETERIZATION  05/01/2012   grafts widely patent  . CARDIOVASCULAR STRESS TEST  03-15-2010   INFERIOR WALL SCAR WITHOUT ANY MEANINGFUL ISCHEMIA/ EF 46% / LOW RISK SCAN  . CORONARY ARTERY BYPASS GRAFT  02-10-2008  DR Einar Gip   X4 VESSEL DISEASE / [facility] CABG  . CYSTOSCOPY  02/08/2012   Procedure: CYSTOSCOPY FLEXIBLE;  Surgeon: Franchot Gallo, MD;  Location: [facility];  Service: Urology;;  . KNEE SURGERY  age 73   RIGHT  . LEFT HEART CATHETERIZATION WITH CORONARY/GRAFT ANGIOGRAM N/A 05/01/2012   Procedure: LEFT HEART CATHETERIZATION WITH Beatrix Fetters;  Surgeon: Sanda Klein, MD;  Location: [facility] CATH [facility];  Service: Cardiovascular;  Laterality: N/A;  . MULTIPLE TEETH EXTRACTIONS (23)/ FOUR QUADRANT ALVEOLPLASTY/ MANDIBULAR LATERAL EXOSTOSES REDUCTIONS  03-24-2010   CHRONIC PERIODONTITIS  . RADIOACTIVE SEED IMPLANT  02/08/2012   Procedure: RADIOACTIVE SEED IMPLANT;  Surgeon: Franchot Gallo, MD;  Location: [facility];  Service: Urology;  Laterality: N/A;  C-ARM   . SHOULDER SURGERY  1992   LEFT    Social History: Social History  Substance Use Topics  . Smoking status: Current Every Day Smoker    Packs/day: 1.00    Years: 50.00    Types: Cigarettes  . Smokeless tobacco: Never Used     Comment: PREVIOUSLY  SMOKED 3PPD / DECREASED TO 1PPD SINCE 2009  . Alcohol use Yes     Comment: occassional   Additional social history: None Please also refer to relevant sections of EMR.  Family History: Family History  Problem Relation Age of Onset  . Cancer Brother     pancreatic   Dad- diabetes Mom- heart disease, diabetes, dementia  Allergies and Medications: Allergies  Allergen Reactions  . Bee Venom Anaphylaxis  . Oysters [Shellfish Allergy] Swelling    Swelling of hands  . Penicillins Other (See Comments)    CAUSES "FREE BLEEDING" Has patient had a PCN reaction causing immediate rash, facial/tongue/throat swelling, SOB or lightheadedness with hypotension: No Has patient had a PCN reaction causing severe rash involving mucus membranes or skin necrosis: No Has patient had a PCN reaction that required hospitalization No Has patient had a PCN reaction occurring within the last 10 years: No If all of the above answers are "NO", then may proceed with Cephalosporin use.   No current facility-administered medications on file prior to encounter.  Current Outpatient Prescriptions on File Prior to Encounter  Medication Sig Dispense Refill  . ketoconazole (NIZORAL) 2 % cream APPLY TOPICALLY ONCE DAILY (Patient not taking: Reported on 08/11/2016) 15 g 2  . methylPREDNISolone (MEDROL DOSEPAK) 4 MG tablet follow package directions (Patient not taking: Reported on 08/11/2016) 21 tablet 0  . oxyCODONE-acetaminophen (ROXICET) 5-325 MG per tablet Take 1-2 tablets by mouth every 4 (four) hours as needed for severe pain. (Patient not taking: Reported on 08/11/2016) 30 tablet 0  . predniSONE (DELTASONE) 10 MG tablet 12 day tapering dose (Patient not taking: Reported on 08/11/2016) 48 tablet 0    Objective: BP 120/71   Pulse 75   Temp 97.9 F (36.6 C) (Oral)   Resp 16   Ht 5' 11"  (1.803 m)   Wt 101.6 kg (224 lb)   SpO2 92%   BMI 31.24 kg/m  Exam: General: NAD, obese male.  Eyes: EOMI, PERRLA,  conjunctival injection of right eye ENTM: nonerythematous oropharynx, mucous membranes pink moist Neck: no carotid bruits, no lymphadenopathy Cardiovascular: RRR, no M/r/g Respiratory: CTAB, no wheezes or crackles Abdomen: SNTND MSK: strength grossly intact Skin: bilateral chronic appearing venous stasis changes, no wounds, DP pulses 2+ Neuro: CN II-XII intact, superior, inferior, and medial visual fields diminished in R eye, decreased sensation of bilateral lower extremities, 5/5 muscle strength in upper and lower extremities bilaterally, normal finger-to-nose bilaterally, normal reflexes Psych: appropriate mood and affect  Labs and Imaging: CBC BMET   Recent Labs Lab 08/11/16 1703  WBC 12.3*  HGB 17.0  HCT 50.7  PLT 198    Recent Labs Lab 08/11/16 1703  NA 135  K 4.3  CL 100*  CO2 27  BUN 9  CREATININE 0.71  GLUCOSE 243*  CALCIUM 9.5     Ct Head Wo Contrast  Result Date: 08/11/2016 CLINICAL DATA:  Partial loss of vision in RIGHT eye question stroke, history hypertension, coronary disease post MI and CABG, prostate cancer EXAM: CT HEAD WITHOUT CONTRAST TECHNIQUE: Contiguous axial images were obtained from the base of the skull through the vertex without intravenous contrast. COMPARISON:  None FINDINGS: Minimal atrophy. Normal ventricular morphology. No midline shift or mass effect. Small cortical infarct at the high RIGHT frontal region. The lack of mass effect/edema favors this being subacute to old in age. No intracranial hemorrhage, mass lesion, extra-axial fluid collection, or additional infarct. Atherosclerotic calcifications at the carotid siphons. Orbital soft tissue planes clear. Bones and sinuses unremarkable. IMPRESSION: Small cortical infarct at the high RIGHT frontal region, favor subacute to old in age due the lack of edema/mass effect. No other intracranial abnormalities identified. Electronically Signed   By: Lavonia Dana M.D.   On: 08/11/2016 17:43    Sela Hilding, MD 08/11/2016, 10:18 PM PGY-1, [place] Intern pager: [phone], text pages welcome   FPTS Upper-Level Resident Addendum  I have independently interviewed and examined the patient. I have discussed the above with the original author and agree with their documentation. My edits for correction/addition/clarification are in blue. Please see also any attending notes.   Hyman Bible, MD PGY-2, [place] Service pager: [phone] (text pages welcome through [place])

## 2016-08-11 NOTE — ED Provider Notes (Signed)
[facility] DEPT Provider Note   CSN: [id number] Arrival date & time: 08/11/16  1654     History   Chief Complaint Chief Complaint  Patient presents with  . Loss of Vision    HPI [name] is a 65 y.o. male.  Eye Problem   This is a new problem. The current episode started more than 2 days ago. The problem occurs constantly. The problem has been gradually worsening. There is a problem in the right eye. There was no injury mechanism. The patient is experiencing no pain. There is no history of trauma to the eye. There is no known exposure to pink eye. He does not wear contacts. Associated symptoms include decreased vision. Pertinent negatives include no numbness, no double vision, no foreign body sensation, no photophobia, no nausea and no vomiting. He has tried eye drops for the symptoms. The treatment provided no relief.    Past Medical History:  Diagnosis Date  . Chronic venous insufficiency LOWER EXTREMITIES  . Coronary artery disease CARDIOLOGIST - DR  [id number]  LAST 1 WK AGO -- WILL REQUEST NOTE AND STRESS TEST  . Hyperglycemia   . Hyperlipidemia   . Hypertension   . Mixed dyslipidemia   . Myocardial infarction ([place])   . Nocturia   . Prostate cancer ([place]) 08/18/11   gleason 8, volume 24.4cc  . S/P CABG x 4   . ST elevation MI (STEMI) ([place]) 02-10-2008   S/P CABG  . Urinary hesitancy     Patient Active Problem List   Diagnosis Date Noted  . CVA (cerebral infarction) 08/11/2016  . CAD - inferior MI 2009, CABG 2009, patent grafts cath 04/2012 09/23/2013  . Obesity 09/23/2013  . Hyperlipidemia 09/23/2013  . Tobacco abuse 09/23/2013  . Prostate cancer ([place]) 08/18/2011  . Myocardial infarction [facility])     Past Surgical History:  Procedure Laterality Date  . CARDIAC CATHETERIZATION  05/01/2012   grafts widely patent  . CARDIOVASCULAR STRESS TEST  03-15-2010   INFERIOR WALL SCAR WITHOUT ANY MEANINGFUL ISCHEMIA/ EF 46% / LOW RISK SCAN  . CORONARY ARTERY BYPASS  GRAFT  02-10-2008  DR Einar Gip   X4 VESSEL DISEASE / [facility] CABG  . CYSTOSCOPY  02/08/2012   Procedure: CYSTOSCOPY FLEXIBLE;  Surgeon: Franchot Gallo, MD;  Location: [facility];  Service: Urology;;  . KNEE SURGERY  age 71   RIGHT  . LEFT HEART CATHETERIZATION WITH CORONARY/GRAFT ANGIOGRAM N/A 05/01/2012   Procedure: LEFT HEART CATHETERIZATION WITH Beatrix Fetters;  Surgeon: Sanda Klein, MD;  Location: [facility] CATH LAB;  Service: Cardiovascular;  Laterality: N/A;  . MULTIPLE TEETH EXTRACTIONS (23)/ FOUR QUADRANT ALVEOLPLASTY/ MANDIBULAR LATERAL EXOSTOSES REDUCTIONS  03-24-2010   CHRONIC PERIODONTITIS  . RADIOACTIVE SEED IMPLANT  02/08/2012   Procedure: RADIOACTIVE SEED IMPLANT;  Surgeon: Franchot Gallo, MD;  Location: [facility];  Service: Urology;  Laterality: N/A;  C-ARM   . [place]   LEFT       Home Medications    Prior to Admission medications   Medication Sig Start Date End Date Taking? Authorizing Provider  aspirin EC 325 MG tablet Take 325 mg by mouth daily.   Yes Historical Provider, MD  calcium carbonate (TUMS - DOSED IN MG ELEMENTAL CALCIUM) 500 MG chewable tablet Chew 1 tablet by mouth 2 (two) times daily as needed for indigestion or heartburn.   Yes Historical Provider, MD  naproxen sodium (ALEVE) 220 MG tablet Take 220-440 mg by mouth 2 (two) times daily  as needed (for pain and sleep).   Yes Historical Provider, MD    Family History Family History  Problem Relation Age of Onset  . Cancer Brother     pancreatic    Social History Social History  Substance Use Topics  . Smoking status: Current Every Day Smoker    Packs/day: 1.00    Years: 50.00    Types: Cigarettes  . Smokeless tobacco: Never Used     Comment: PREVIOUSLY SMOKED 3PPD / DECREASED TO 1PPD SINCE 2009  . Alcohol use Yes     Comment: occassional     Allergies   Bee venom; Oysters [shellfish allergy]; and Penicillins   Review of  Systems Review of Systems  Constitutional: Negative for chills.  Eyes: Positive for visual disturbance. Negative for double vision and photophobia.  Respiratory: Negative for cough and shortness of breath.   Cardiovascular: Negative for chest pain.  Gastrointestinal: Negative for nausea and vomiting.  Neurological: Negative for numbness.  All other systems reviewed and are negative.    Physical Exam Updated Vital Signs BP 125/69 (BP Location: Left Arm)   Pulse 71   Temp 97.8 F (36.6 C) (Oral)   Resp 17   Ht 5\' 11"  (1.803 m)   Wt 224 lb (101.6 kg)   SpO2 97%   BMI 31.24 kg/m   Physical Exam  Constitutional: He is oriented to person, place, and time. He appears well-developed and well-nourished.  HENT:  Head: Normocephalic and atraumatic.  Eyes: Conjunctivae are normal.  Neck: Neck supple.  Cardiovascular: Normal rate and regular rhythm.   No murmur heard. Pulmonary/Chest: Effort normal and breath sounds normal. No respiratory distress.  Abdominal: Soft. There is no tenderness.  Musculoskeletal: He exhibits no edema.  Neurological: He is alert and oriented to person, place, and time.  No altered mental status, able to give full seemingly accurate history.  Face is symmetric, EOM's intact, pupils equal and reactive, vision decreased out of right eye, tongue and uvula midline without deviation Upper and Lower extremity motor 5/5, intact pain perception in distal extremities, 2+ reflexes in biceps, patella and achilles tendons. Finger to nose normal, heel to shin normal.  Skin: Skin is warm and dry.  Psychiatric: He has a normal mood and affect.  Nursing note and vitals reviewed.    ED Treatments / Results  Labs (all labs ordered are listed, but only abnormal results are displayed) Labs Reviewed  CBC - Abnormal; Notable for the following:       Result Value   WBC 12.3 (*)    RBC 5.88 (*)    All other components within normal limits  DIFFERENTIAL - Abnormal; Notable  for the following:    Lymphs Abs 5.2 (*)    All other components within normal limits  COMPREHENSIVE METABOLIC PANEL - Abnormal; Notable for the following:    Chloride 100 (*)    Glucose, Bld 243 (*)    All other components within normal limits  I-STAT TROPOININ, ED    EKG  EKG Interpretation  Date/Time:  Friday August 11 2016 17:04:53 EDT Ventricular Rate:  82 PR Interval:  158 QRS Duration: 110 QT Interval:  346 QTC Calculation: 404 R Axis:   67 Text Interpretation:  Normal sinus rhythm Left ventricular hypertrophy with repolarization abnormality Cannot rule out Anterior infarct , age undetermined Abnormal ECG LVH is new. rest of changes similar.  Confirmed by [facility] MD, Corene Cornea [phone]) on 08/11/2016 8:25:55 PM       Radiology Ct  Head Wo Contrast  Result Date: 08/11/2016 CLINICAL DATA:  Partial loss of vision in RIGHT eye question stroke, history hypertension, coronary disease post MI and CABG, prostate cancer EXAM: CT HEAD WITHOUT CONTRAST TECHNIQUE: Contiguous axial images were obtained from the base of the skull through the vertex without intravenous contrast. COMPARISON:  None FINDINGS: Minimal atrophy. Normal ventricular morphology. No midline shift or mass effect. Small cortical infarct at the high RIGHT frontal region. The lack of mass effect/edema favors this being subacute to old in age. No intracranial hemorrhage, mass lesion, extra-axial fluid collection, or additional infarct. Atherosclerotic calcifications at the carotid siphons. Orbital soft tissue planes clear. Bones and sinuses unremarkable. IMPRESSION: Small cortical infarct at the high RIGHT frontal region, favor subacute to old in age due the lack of edema/mass effect. No other intracranial abnormalities identified. Electronically Signed   By: Lavonia Dana M.D.   On: 08/11/2016 17:43    Procedures Procedures (including critical care time)  Medications Ordered in ED Medications  nicotine (NICODERM CQ - dosed in  mg/24 hours) patch 21 mg (21 mg Transdermal Patch Applied 08/11/16 2134)  atorvastatin (LIPITOR) tablet 40 mg (not administered)     Initial Impression / Assessment and Plan / ED Course  I have reviewed the triage vital signs and the nursing notes.  Pertinent labs & imaging results that were available during my care of the patient were reviewed by me and considered in my medical decision making (see chart for details).  Clinical Course    Subacute stroke. Already had aspirin. D/w neurology and will admit to medicine.   Final Clinical Impressions(s) / ED Diagnoses   Final diagnoses:  None    New Prescriptions Current Discharge Medication List       Merrily Pew, MD 08/12/16 0025

## 2016-08-11 NOTE — ED Notes (Signed)
Pt came in on referral of eye specialist. Pt had vision lost in right eye earlier in the week that resolved and came back. Pt states dr. Marland Kitchensaid I had a stroke in my eye." Pt has no other deficits neuro intact and stroke swallow screen passed. Family at bedside. Pt is A&O x 4. Pt did have a visual acuity exam and is charted.

## 2016-08-11 NOTE — ED Notes (Signed)
Neuro MD at bedside

## 2016-08-11 NOTE — Consult Note (Addendum)
Neurology Consult Note  Reason for Consultation: Stroke  Requesting provider: Merrily Pew, MD  CC: Vision loss in the right eye  HPI: This is a 65-yo RH man who was sent to the ED by his ophthalmologist this afternoon for evaluation of retinal ischemia. History is obtained directly from the patient who is an excellent historian. His wife is present at the bedside and offers additional information as needed.   The patient reports that he and his wife were driving to the grocery store on the morning of 08/08/16 when he suddenly lost vision in his right eye. He states that everything went black. He continued driving and reached the store where they sat in the car for a bit. His vision eventually returned to normal after about 20 minutes and they finished their grocery shopping and returned home. On the afternoon of 08/09/16, he again lost the vision in his right eye but this time it did not return. He tracked down the contact information for his optometrist and went to see him. He states he was told that his eye looked okay on his exam and he referred him to a retinal specialist. He was seen there today and was found to have evidence of retinal ischemia in the right eye per the report of the patient and his wife--I do not have notes from the ophthalmologist at the time of my visit. He was sent to the ED for further management. Since his arrival in the ED, he has noted steady improvement in the vision in his right eye.   The patient states that his symptoms were limited to total vision loss in his right eye. He did not have any vision loss in the left eye. He denies any associated headache. He had no weakness,  double vision, vertigo, trouble talking, trouble swallowing, dyscoordination, balance problems, or changes in his gait. He has longstanding tingling in the soles of both feet but reports no new sensory changes. He reports no prior history of stroke.   PMH:  Past Medical History:  Diagnosis Date  .  Chronic venous insufficiency LOWER EXTREMITIES  . Coronary artery disease CARDIOLOGIST - DR  CLEXNTZG-  LAST 1 WK AGO -- WILL REQUEST NOTE AND STRESS TEST  . Hyperglycemia   . Hyperlipidemia   . Hypertension   . Mixed dyslipidemia   . Myocardial infarction ([place])   . Nocturia   . Prostate cancer ([place]) 08/18/11   gleason 8, volume 24.4cc  . S/P CABG x 4   . ST elevation MI (STEMI) ([place]) 02-10-2008   S/P CABG  . Urinary hesitancy     PSH:  Past Surgical History:  Procedure Laterality Date  . CARDIAC CATHETERIZATION  05/01/2012   grafts widely patent  . CARDIOVASCULAR STRESS TEST  03-15-2010   INFERIOR WALL SCAR WITHOUT ANY MEANINGFUL ISCHEMIA/ EF 46% / LOW RISK SCAN  . CORONARY ARTERY BYPASS GRAFT  02-10-2008  DR Einar Gip   X4 VESSEL DISEASE / [facility] CABG  . CYSTOSCOPY  02/08/2012   Procedure: CYSTOSCOPY FLEXIBLE;  Surgeon: Franchot Gallo, MD;  Location: [facility];  Service: Urology;;  . KNEE SURGERY  age 6   RIGHT  . LEFT HEART CATHETERIZATION WITH CORONARY/GRAFT ANGIOGRAM N/A 05/01/2012   Procedure: LEFT HEART CATHETERIZATION WITH Beatrix Fetters;  Surgeon: Sanda Klein, MD;  Location: [facility] CATH LAB;  Service: Cardiovascular;  Laterality: N/A;  . MULTIPLE TEETH EXTRACTIONS (23)/ FOUR QUADRANT ALVEOLPLASTY/ MANDIBULAR LATERAL EXOSTOSES REDUCTIONS  03-24-2010   CHRONIC PERIODONTITIS  . RADIOACTIVE  SEED IMPLANT  02/08/2012   Procedure: RADIOACTIVE SEED IMPLANT;  Surgeon: Franchot Gallo, MD;  Location: [facility];  Service: Urology;  Laterality: N/A;  C-ARM   . SHOULDER SURGERY  1992   LEFT    Family history: Family History  Problem Relation Age of Onset  . Cancer Brother     pancreatic    Social history: He is married and lives with his wife. He is a retired Scientist, water quality. He smokes one pack of cigarettes daily, previously smoked up to three packs per day. He drinks alcohol on occasion, denies heavy use. He denies any  illicit drug use.    Current outpatient meds: Current Meds  Medication Sig  . aspirin EC 325 MG tablet Take 325 mg by mouth daily.  . calcium carbonate (TUMS - DOSED IN MG ELEMENTAL CALCIUM) 500 MG chewable tablet Chew 1 tablet by mouth 2 (two) times daily as needed for indigestion or heartburn.  . naproxen sodium (ALEVE) 220 MG tablet Take 220-440 mg by mouth 2 (two) times daily as needed (for pain and sleep).    Current inpatient meds:  Current Facility-Administered Medications  Medication Dose Route Frequency Provider Last Rate Last Dose  . nicotine (NICODERM CQ - dosed in mg/24 hours) patch 21 mg  21 mg Transdermal Daily Merrily Pew, MD   21 mg at 08/11/16 2134   Current Outpatient Prescriptions  Medication Sig Dispense Refill  . aspirin EC 325 MG tablet Take 325 mg by mouth daily.    . calcium carbonate (TUMS - DOSED IN MG ELEMENTAL CALCIUM) 500 MG chewable tablet Chew 1 tablet by mouth 2 (two) times daily as needed for indigestion or heartburn.    . naproxen sodium (ALEVE) 220 MG tablet Take 220-440 mg by mouth 2 (two) times daily as needed (for pain and sleep).      Allergies: Allergies  Allergen Reactions  . Bee Venom Anaphylaxis  . Oysters [Shellfish Allergy] Swelling    Swelling of hands  . Penicillins Other (See Comments)    CAUSES "FREE BLEEDING" Has patient had a PCN reaction causing immediate rash, facial/tongue/throat swelling, SOB or lightheadedness with hypotension: No Has patient had a PCN reaction causing severe rash involving mucus membranes or skin necrosis: No Has patient had a PCN reaction that required hospitalization No Has patient had a PCN reaction occurring within the last 10 years: No If all of the above answers are "NO", then may proceed with Cephalosporin use.    ROS: As per HPI. A full 14-point review of systems was performed and is otherwise unremarkable.   PE:  BP 120/71   Pulse 75   Temp 97.9 F (36.6 C) (Oral)   Resp 16   Ht _0   (1.803 m)   Wt 101.6 kg (224 lb)   SpO2 92%   BMI 31.24 kg/m   General: WD obese Caucasian man lying on ED gurney, no acute distress. AAO x4. Speech clear, no dysarthria. No aphasia. Follows commands briskly. Affect is bright with congruent mood. Comportment is normal.  HEENT: Normocephalic. Neck supple without LAD. MMM, OP clear. Dentures in place. Sclerae anicteric. Mild conjunctival injection.  CV: Regular, distant, no obvious murmur. Carotid pulses full and symmetric, no bruits. Distal pulses 2+ and symmetric.  Lungs: CTAB on anterior exam.  Abdomen: Soft, obese, non-distended, non-tender. Bowel sounds present x4.  Extremities: No C/C/E. Skin over both lower legs is shiny and hyperpigmented with dry flaky skin over both feet. Neuro:  CN:  Pupils are mildly unequal with the R pupil 2-->1 mm and the L pupil 3-->2 mm. Visual fields are intact to confrontation. Acuity is about 20/40 in the right eye. EOMI notable for breakup of smooth pursuits in all directions. He has some hypermetric saccades but no nystagmus. No reported diplopia. Facial sensation is intact to light touch. Face is symmetric at rest with normal strength and mobility. Hearing is intact to conversational voice. Palate elevates symmetrically and uvula is midline. Voice is normal in tone, pitch and quality. Bilateral SCM and trapezii are 5/5. Tongue is midline with normal bulk and mobility.  Motor: Normal bulk, tone, and strength throughout with the exception of 4+/5 strength in the R triceps, wrist extensors, finger extensors and grip. No tremor or other abnormal movements. No drift.  Sensation: Intact to light touch and pinprick. Vibration is diminished in BLE to above the knee, worse on the right than the left. Proprioception in both great toes is normal.  DTRs: 2+, symmetric with absent ankle jerks bilaterally. Toes downgoing bilaterally. No pathologic reflexes.  Coordination: Finger-to-nose and heel-to-shin are without dysmetria.  When asked to touch his nose with eyes closed, he misses with the right hand. Finger taps are normal in amplitude and speed, no decrement.     Labs:  CMP notable for Cl 100, glucose 243 CBC notable for wbc 12.3K with mild elevation in absolute lymphs Troponin-I 0.01  Imaging:  I have personally and independently reviewed the [facility] without contrast from 08/11/16. This shows a focal area of hypodensity in the superior aspect of the right frontal lobe that is nonspecific in character but most suggestive of chronic gliosis due to remote infarct or trauma. Another area of hypodensity is noted in the white matter of the anterior right frontal lobe (images 22 and 23), also suggestive of remote ischemia. No obvious acute abnormality is noted.    Assessment and Plan:  1. 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 It sounds as if he had a TIA on 8/15 with completed infarct on 8/16. This is most likely nonvasculitic given that he has numerous vascular risk factors, including HTN, DM, hyperlipidemia, chronic tobacco abuse, CAD, and obesity. He was not taking an antiplatelet medication at the time of this event as he states he stopped taking all of his medicines because he got tired of them. His ophthalmologist had him take aspirin 325 mg today. He has begun to note improvement in his vision at this point which is very encouraging. Recommend checking carotid Dopplers, TTE, fasting lipids, and hemoglobin a1c as you are. Continue aspirin, can reduce dose to 81 mg daily. Giant cell arteritis is a common cause of arteritic AION and must be considered though he does not endorse any other symptoms such as jaw claudication, temporal tenderness, headache, or scalp pain. Check ESR and CRP. If these are elevated then may need to consider treatment with steroids and temporal artery biopsies.   2. Cerebrovascular  disease: His head CT shows areas of hypodensity in the right frontal lobe that are suggestive of chronic ischemic infarcts. He has numerous risk factors as noted above. Check MRI brain without contrast to better characterize the anomalies seen on CT. Additional workup as above with TTE, carotid Dopplers, fasting lipids, and hemoglobin a1c. Continue aspirin 81 mg daily. Recommend addition of atorvastatin 80 mg daily. Ensure tight control of blood pressure, lipids and glucose  longterm. Patient was counseled on the importance of smoking cessation to optimize risk factor reduction.   3. RUE weakness: He has subtle weakness in the right arm in an upper motor neuron pattern with some slight clumsiness in that arm as well. The etiology for this is not certain as it cannot be explained by the abnormalities seen on CT scan. Check MRI brain. This is not functionally limiting and the patient seemed unaware of it so there does not appear to be any role for OT at this time.   4. 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 This involves both large and small fibers. This is most likely due to underlying DM. He has some uncomfortable dysesthesias that he currently manages with occasional NSAIDs and ice baths. Ensure tight glycemic control. If not checked recently, would check vitamin B12 and RPR. He reports that his symptoms are tolerable right now so no role for pharmacologic intervention yet.   This was discussed with the patient and his wife. They are in agreement with the plan as noted. They were given the opportunity to ask any questions and these were addressed to their satisfaction.   Thank you for the opportunity to participate in the patient's care. Please feel free to call with any questions or concerns. The stroke team will assume care of the patient beginning with AM rounds 08/12/16.

## 2016-08-11 NOTE — ED Triage Notes (Signed)
Pt states he was sent here by the opthomologist for a "stroke in my R eye".  States partial loss of vision to R eye on Tues that was confirmed by MD as stroke today.  Pt denies dizziness, numbness or any other neuro deficits.

## 2016-08-12 ENCOUNTER — Inpatient Hospital Stay (HOSPITAL_COMMUNITY): Payer: BLUE CROSS/BLUE SHIELD

## 2016-08-12 ENCOUNTER — Encounter: Payer: Self-pay | Admitting: Nurse Practitioner

## 2016-08-12 DIAGNOSIS — H53139 Sudden visual loss, unspecified eye: Secondary | ICD-10-CM

## 2016-08-12 DIAGNOSIS — I10 Essential (primary) hypertension: Secondary | ICD-10-CM

## 2016-08-12 DIAGNOSIS — R739 Hyperglycemia, unspecified: Secondary | ICD-10-CM

## 2016-08-12 DIAGNOSIS — I639 Cerebral infarction, unspecified: Secondary | ICD-10-CM | POA: Diagnosis present

## 2016-08-12 DIAGNOSIS — I152 Hypertension secondary to endocrine disorders: Secondary | ICD-10-CM

## 2016-08-12 DIAGNOSIS — I6330 Cerebral infarction due to thrombosis of unspecified cerebral artery: Secondary | ICD-10-CM

## 2016-08-12 DIAGNOSIS — I63442 Cerebral infarction due to embolism of left cerebellar artery: Secondary | ICD-10-CM

## 2016-08-12 LAB — GLUCOSE, CAPILLARY
GLUCOSE-CAPILLARY: 272 mg/dL — AB (ref 65–99)
Glucose-Capillary: 301 mg/dL — ABNORMAL HIGH (ref 65–99)
Glucose-Capillary: 268 mg/dL — ABNORMAL HIGH (ref 65–99)

## 2016-08-12 LAB — CBC
HCT: 47.4 % (ref 39.0–52.0)
Hemoglobin: 15.9 g/dL (ref 13.0–17.0)
MCH: 29 pg (ref 26.0–34.0)
MCHC: 33.5 g/dL (ref 30.0–36.0)
MCV: 86.3 fL (ref 78.0–100.0)
PLATELETS: 202 10*3/uL (ref 150–400)
RBC: 5.49 MIL/uL (ref 4.22–5.81)
RDW: 13.6 % (ref 11.5–15.5)
WBC: 12.6 10*3/uL — AB (ref 4.0–10.5)

## 2016-08-12 LAB — C-REACTIVE PROTEIN: CRP: 1.1 mg/dL — ABNORMAL HIGH (ref <1.0)

## 2016-08-12 LAB — LIPID PANEL
Cholesterol: 161 mg/dL (ref 0–200)
HDL: 24 mg/dL — AB (ref >40)
LDL CALC: 71 mg/dL (ref 0–99)
Total CHOL/HDL Ratio: 6.7 RATIO
Triglycerides: 332 mg/dL — ABNORMAL HIGH (ref <150)
VLDL: 66 mg/dL — ABNORMAL HIGH (ref 0–40)

## 2016-08-12 LAB — CREATININE, SERUM: CREATININE: 0.68 mg/dL (ref 0.61–1.24)

## 2016-08-12 LAB — TSH: TSH: 3.549 u[IU]/mL (ref 0.350–4.500)

## 2016-08-12 LAB — VITAMIN B12: Vitamin B-12: 318 pg/mL (ref 180–914)

## 2016-08-12 LAB — SEDIMENTATION RATE: SED RATE: 6 mm/h (ref 0–16)

## 2016-08-12 LAB — RPR: RPR Ser Ql: NONREACTIVE

## 2016-08-12 MED ORDER — INSULIN ASPART 100 UNIT/ML ~~LOC~~ SOLN
0.0000 [IU] | Freq: Three times a day (TID) | SUBCUTANEOUS | Status: DC
  Administered 2016-08-12: 7 [IU] via SUBCUTANEOUS
  Administered 2016-08-12 – 2016-08-13 (×2): 5 [IU] via SUBCUTANEOUS
  Administered 2016-08-13: 3 [IU] via SUBCUTANEOUS
  Administered 2016-08-13 – 2016-08-14 (×2): 5 [IU] via SUBCUTANEOUS

## 2016-08-12 MED ORDER — HEPARIN SODIUM (PORCINE) 5000 UNIT/ML IJ SOLN
5000.0000 [IU] | Freq: Three times a day (TID) | INTRAMUSCULAR | Status: DC
  Administered 2016-08-12 – 2016-08-14 (×6): 5000 [IU] via SUBCUTANEOUS
  Filled 2016-08-12 (×6): qty 1

## 2016-08-12 MED ORDER — ASPIRIN EC 325 MG PO TBEC: 325.0000 mg | DELAYED_RELEASE_TABLET | Freq: Every day | ORAL | Status: DC

## 2016-08-12 MED ORDER — ASPIRIN EC 81 MG PO TBEC
81.0000 mg | DELAYED_RELEASE_TABLET | Freq: Every day | ORAL | Status: DC
  Administered 2016-08-12 – 2016-08-14 (×3): 81 mg via ORAL
  Filled 2016-08-12 (×3): qty 1

## 2016-08-12 MED ORDER — SENNOSIDES-DOCUSATE SODIUM 8.6-50 MG PO TABS: 1.0000 | ORAL_TABLET | Freq: Every evening | ORAL | Status: DC | PRN

## 2016-08-12 MED ORDER — STROKE: EARLY STAGES OF RECOVERY BOOK
Freq: Once | Status: AC
  Administered 2016-08-12: 04:00:00

## 2016-08-12 MED ORDER — INSULIN ASPART 100 UNIT/ML ~~LOC~~ SOLN
0.0000 [IU] | Freq: Every day | SUBCUTANEOUS | Status: DC
  Administered 2016-08-12: 3 [IU] via SUBCUTANEOUS
  Administered 2016-08-13: 4 [IU] via SUBCUTANEOUS

## 2016-08-12 NOTE — Progress Notes (Addendum)
STROKE TEAM PROGRESS NOTE   HISTORY OF PRESENT ILLNESS (per record)  This is a 65-yo RH man who was sent to the ED by his ophthalmologist this afternoon for evaluation of retinal ischemia. History is obtained directly from the patient who is an excellent historian. His wife is present at the bedside and offers additional information as needed.   The patient reports that he and his wife were driving to the grocery store on the morning of 08/08/16 when he suddenly lost vision in his right eye. He states that everything went black. He continued driving and reached the store where they sat in the car for a bit. His vision eventually returned to normal after about 20 minutes and they finished their grocery shopping and returned home. On the afternoon of 08/09/16, he again lost the vision in his right eye but this time it did not return. He tracked down the contact information for his optometrist and went to see him. He states he was told that his eye looked okay on his exam and he referred him to a retinal specialist. He was seen there today and was found to have evidence of retinal ischemia in the right eye per the report of the patient and his wife--I do not have notes from the ophthalmologist at the time of my visit. He was sent to the ED for further management. Since his arrival in the ED, he has noted steady improvement in the vision in his right eye.   The patient states that his symptoms were limited to total vision loss in his right eye. He did not have any vision loss in the left eye. He denies any associated headache. He had no weakness,  double vision, vertigo, trouble talking, trouble swallowing, dyscoordination, balance problems, or changes in his gait. He has longstanding tingling in the soles of both feet but reports no new sensory changes. He reports no prior history of stroke.     SUBJECTIVE (INTERVAL HISTORY) His family was not at the bedside.  Overall he feels his condition is completely  resolved. He denies visual changes of any kind.  Full ROS was negative.  We discussed his MRI and overall care plan    OBJECTIVE Temp:  [97.5 F (36.4 C)-98 F (36.7 C)] 98 F (36.7 C) (08/19 0600) Pulse Rate:  [65-80] 75 (08/19 0600) Cardiac Rhythm: Normal sinus rhythm (08/19 0605) Resp:  [16-18] 18 (08/19 0600) BP: (114-164)/(64-87) 114/74 (08/19 0600) SpO2:  [92 %-98 %] 95 % (08/19 0600) Weight:  [101.6 kg (224 lb)] 101.6 kg (224 lb) (08/18 1702)  CBC:  Recent Labs Lab 08/11/16 1703 08/12/16 0048  WBC 12.3* 12.6*  NEUTROABS 6.0  --   HGB 17.0 15.9  HCT 50.7 47.4  MCV 86.2 86.3  PLT 198 [id number]    Basic Metabolic Panel:  Recent Labs Lab 08/11/16 1703 08/12/16 0048  NA 135  --   K 4.3  --   CL 100*  --   CO2 27  --   GLUCOSE 243*  --   BUN 9  --   CREATININE 0.71 0.68  CALCIUM 9.5  --     Lipid Panel:    Component Value Date/Time   CHOL 161 08/12/2016 0048   TRIG 332 (H) 08/12/2016 0048   HDL 24 (L) 08/12/2016 0048   CHOLHDL 6.7 08/12/2016 0048   VLDL 66 (H) 08/12/2016 0048   LDLCALC 71 08/12/2016 0048   HgbA1c:  Lab Results  Component Value Date   HGBA1C (  H) 02/14/2008    6.2 (NOTE)   The ADA recommends the following therapeutic goals for glycemic   control related to Hgb A1C measurement:   Goal of Therapy:   < 7.0% Hgb A1C   Action Suggested:  > 8.0% Hgb A1C   Ref:  Diabetes Care, 22, Suppl. 1, 1999   Urine Drug Screen: No results found for: LABOPIA, COCAINSCRNUR, LABBENZ, AMPHETMU, THCU, LABBARB    IMAGING  Ct Head Wo Contrast 08/11/2016  Small cortical infarct at the high RIGHT frontal region, favor subacute to old in age due the lack of edema/mass effect.  No other intracranial abnormalities identified.    MRI Brain Wo Contrast -08/11/2016 At least 3 areas of acute, nonhemorrhagic cortical infarction, affecting the RIGHT hemisphere in the posterior frontal cortex and occipital cortex consistent with a shower of emboli.  Asymmetric  restricted diffusion involving the distal optic nerve on the RIGHT, suggesting acute infarction, correlating with anterior ischemic optic neuropathy.  Premature atrophy with mild chronic microvascular ischemic change. Remote cortical infarct, RIGHT posterior frontal region.  No intracranial flow reducing lesion on MRI  is evident.   PHYSICAL EXAM  General: WD obese Caucasian man lying on ED gurney, no acute distress. AAO x4. Speech clear, no dysarthria. No aphasia. Follows commands briskly. Affect is bright with congruent mood. Comportment is normal.  HEENT: Normocephalic. Neck supple without LAD. MMM, OP clear. Dentures in place. Sclerae anicteric. Mild conjunctival injection.  CV: Regular, distant, no obvious murmur. Carotid pulses full and symmetric, no bruits. Distal pulses 2+ and symmetric.  Lungs: CTAB on anterior exam.  Abdomen: Soft, obese, non-distended, non-tender. Bowel sounds present x4.  Extremities: No C/C/E. Skin over both lower legs is shiny and hyperpigmented with dry flaky skin over both feet. Neuro:  CN: Pupils are mildly unequal with the R pupil 2-->1 mm and the L pupil 3-->2 mm. Visual fields are intact to confrontation. EOMI notable for breakup of smooth pursuits in all directions. He has some hypermetric saccades but no nystagmus. No reported diplopia. Facial sensation is intact to light touch. Face is symmetric at rest with normal strength and mobility. Hearing is intact to conversational voice. Palate elevates symmetrically and uvula is midline. Voice is normal in tone, pitch and quality. Bilateral SCM and trapezii are 5/5. Tongue is midline with normal bulk and mobility.  Motor: Normal bulk, tone, and strength throughout with the exception of 4+/5 strength in the R triceps, wrist extensors, finger extensors and grip. No tremor or other abnormal movements. No drift.  Sensation: Intact to light touch and pinprick. Vibration is diminished in BLE to above the knee,  worse on the right than the left. Proprioception in both great toes is normal.  DTRs: 2+, symmetric with absent ankle jerks bilaterally. Toes downgoing bilaterally. No pathologic reflexes.  Coordination: Finger-to-nose and heel-to-shin are without dysmetria. When asked to touch his nose with eyes closed, he misses with the right hand. Finger taps are normal in amplitude and speed, no decrement.   ASSESSMENT/PLAN Mr. ZACKARIE MASSARI is a 65 y.o. male with history of coronary artery disease with previous MI as well as coronary artery bypass graft surgery, tobacco history, dyslipidemia, hypertension, hyperglycemia, and chronic venous insufficiency,  presenting with transient vision loss right eye.  He did not receive IV t-PA due to late presentation.  Stroke:  Non-dominant - Small cortical infarct at the high RIGHT frontal region - probably emboli - unknown source.  Resultant transient visual loss MRI  - At least  3 areas of acute, nonhemorrhagic cortical infarction, affecting the RIGHT hemisphere in the posterior frontal cortex and occipital cortex consistent with a shower of emboli.  Asymmetric restricted diffusion involving the distal optic nerve on the RIGHT, suggesting acute infarction, correlating with anterior ischemic optic neuropathy.  Premature atrophy with mild chronic microvascular ischemic change. Remote cortical infarct, RIGHT posterior frontal region.  No intracranial flow reducing lesion on MRI  is evident.     MRA - not performed  Carotid Doppler - pending  2D Echo - pending  LDL - 71; however, very high TGs  HgbA1c - pending  VTE prophylaxis -  subcutaneous heparin  Diet heart healthy/carb modified Room service appropriate? Yes; Fluid consistency: Thin  aspirin 325 mg daily prior to admission, now on aspirin 81 mg daily  Patient counseled to be compliant with his antithrombotic medications  Ongoing aggressive stroke risk factor management  Therapy  recommendations:  No follow-up physical therapy recommended. OT evaluation pending.  Disposition:  - pending  Hypertension  Stable  Permissive hypertension (OK if < 220/120) but gradually normalize in 5-7 days  Long-term BP goal normotensive  Hyperlipidemia  Home meds: No lipid lowering medications prior to admission.  LDL 71, goal < 70   Diabetes  HgbA1c pending, goal < 7.0  Uncontrolled ( steroid dose pack PTA )   Other Stroke Risk Factors  Advanced age  Cigarette smoker - advised to stop smoking  Occasional ETOH use, advised to drink no more than 1 drink a day  Obesity, Body mass index is 31.24 kg/m., recommend weight loss, diet and exercise as appropriate   Coronary artery disease    Other Active Problems  Mild leukocytosis - 12.6  ( steroid dose pack PTA )   Hospital day # 1  ATTENDING NOTE: Patient was seen and examined by me personally. Documentation reflects findings. The laboratory and radiographic studies reviewed by me. ROS completed by me personally and pertinent positives fully documented  Condition: stable  Assessment and plan completed by me personally and fully documented above. Plans/Recommendations include:     Stroke work up ongoing  recommend TG treatment as well as treatment for LDL >70  Patient should follow-up with ophth after discharge  Encouraged smoking cessation  Will follow  SIGNED BY: Dr. Elissa Hefty      To contact Stroke Continuity provider, please refer to [email or web address] After hours, contact General Neurology

## 2016-08-12 NOTE — Progress Notes (Signed)
Family Medicine Teaching Service Daily Progress Note Intern Pager: [phone]  Patient name: [name] Medical record number: [id number] Date of birth: 1951-04-11 Age: 65 y.o. Gender: male  Primary Care Provider: No PCP Per Patient Consultants: neurology Code Status: FULL  Pt Overview and Major Events to Date:  Pt resting comfortably. No additional imaging has been done yet. Pt continues to endorse improving vision.   Assessment and Plan: [name] is a 65 y.o. male presenting with R eye vision loss. PMH is significant for inferior MI and CABG in 2009, obesity, tobacco abuse, prostate cancer.  #R frontal subacute infarct, R vision loss: Pt reports intermittent R sided vision loss x 3 days, took 31m aspirin today. Vision is returning in R eye per pt report. Pt with history of CAD, CABG, but does not take any medicines daily. Does not see any PCP. CT head showed small subacute/old infarct in the right frontal region. On exam, he has decreased visual acuity in the upper, lower, and medial aspects of his visual field in the right eye. No other focal deficits. Pt has multiple risk factors for stroke including HTN, DM, HLD, tobacco abuse, obesity. ESR not elevated at 6, CRP mildly elevated at 1.1. Total cholesterol 161, Triglycerides elevated at 332, HDL low, LDL WNL. TSH normal. -neurology consulted, appreciate recs -MRI/MRA ordered -carotid [place] -32% risk of cardiovascular event in the next 10 years if HgA1C does indicate diabetes -HbA1C pending -Trop negx1. No need to trend, as Pt denies chest pain and EKG without signs of ischemia/infarct. -consider adding ace-inhibitor for BP control. Permissive hypertension x 48 hours for now. -echo ordered -ASA 854mQD -Will start Lipitor 4029maily -cardiac monitoring -nicotine patch, continued smoking cessation counseling - Neuro checks q2hrs x 12 hours, then q4hrs. - PT/OT/SLP -Vit B12 per neuro - 318 -RPR pending  #CAD:  inferior MI in 2009, CABG 2009, graft patent on cath 2013. Pt reports he does not see a cardiologist regularly and does not take any medicines.  -ASA as above -consider ace inhibitor as above -Will start Lipitor 47m87mily  #Tobacco abuse Pt smokes 1 pack per day x50 years (previously smoked 3 ppd until 2009). Reports he may be willing to cut down.  -nicotene patch -continued smoking cessation counseling  #Hyperglycemia: Glucose 243 on BMET. - A1c as above - Will likely need to start oral diabetic med this admission.  FEN/GI: heart healthy/carb-modified diet, as Pt passed his bedside swallow. Prophylaxis: SCDs  Disposition: continued monitoring on tele  Subjective:  Pt resting comfortably in a pleasant mood. Feels his vision continues to improve. Continues to endorse bilateral lower extremity parasthesias.  Objective: Temp:  [97.5 F (36.4 C)-97.9 F (36.6 C)] 97.7 F (36.5 C) (08/19 0201) Pulse Rate:  [71-80] 72 (08/19 0201) Resp:  [16-18] 17 (08/19 0201) BP: (114-164)/(64-87) 114/64 (08/19 0201) SpO2:  [92 %-98 %] 96 % (08/19 0201) Weight:  [101.6 kg (224 lb)] 101.6 kg (224 lb) (08/18 1702) Physical Exam: General: NAD, obese male. Cardiovascular: RRR, no m/r/g Respiratory: CTAB, no wheezes or crackles Abdomen: SNTND Extremities: bilateral chronic appearing venous stasis changes, no wounds  Laboratory:  Recent Labs Lab 08/11/16 1703 08/12/16 0048  WBC 12.3* 12.6*  HGB 17.0 15.9  HCT 50.7 47.4  PLT 198 202    Recent Labs Lab 08/11/16 1703 08/12/16 0048  NA 135  --   K 4.3  --   CL 100*  --   CO2 27  --   BUN  9  --   CREATININE 0.71 0.68  CALCIUM 9.5  --   PROT 7.6  --   BILITOT 0.5  --   ALKPHOS 107  --   ALT 29  --   AST 20  --   GLUCOSE 243*  --     Imaging/Diagnostic Tests: Ct Head Wo Contrast  Result Date: 08/11/2016 CLINICAL DATA:  Partial loss of vision in RIGHT eye question stroke, history hypertension, coronary disease post MI and  CABG, prostate cancer EXAM: CT HEAD WITHOUT CONTRAST TECHNIQUE: Contiguous axial images were obtained from the base of the skull through the vertex without intravenous contrast. COMPARISON:  None FINDINGS: Minimal atrophy. Normal ventricular morphology. No midline shift or mass effect. Small cortical infarct at the high RIGHT frontal region. The lack of mass effect/edema favors this being subacute to old in age. No intracranial hemorrhage, mass lesion, extra-axial fluid collection, or additional infarct. Atherosclerotic calcifications at the carotid siphons. Orbital soft tissue planes clear. Bones and sinuses unremarkable. IMPRESSION: Small cortical infarct at the high RIGHT frontal region, favor subacute to old in age due the lack of edema/mass effect. No other intracranial abnormalities identified. Electronically Signed   By: Lavonia Dana M.D.   On: 08/11/2016 17:43    Sela Hilding, MD 08/12/2016, 6:16 AM PGY-1, [place] Intern pager: [phone], text pages welcome

## 2016-08-12 NOTE — Evaluation (Signed)
Speech Language Pathology Evaluation Patient Details Name: [name] MRN: [id number] DOB: 1951-08-03 Today's Date: 08/12/2016 Time: 0950-1005 SLP Time Calculation (min) (ACUTE ONLY): 15 min  Problem List:  Patient Active Problem List   Diagnosis Date Noted  . Stroke ([place]) 08/12/2016  . Hyperglycemia   . Essential hypertension   . Vision, loss, sudden   . CVA (cerebral infarction) 08/11/2016  . CAD - inferior MI 2009, CABG 2009, patent grafts cath 04/2012 09/23/2013  . Obesity 09/23/2013  . Hyperlipidemia 09/23/2013  . Tobacco abuse 09/23/2013  . Prostate cancer ([place]) 08/18/2011  . Myocardial infarction [facility])    Past Medical History:  Past Medical History:  Diagnosis Date  . Chronic venous insufficiency LOWER EXTREMITIES  . Coronary artery disease CARDIOLOGIST - DR  [id number]  LAST 1 WK AGO -- WILL REQUEST NOTE AND STRESS TEST  . Hyperglycemia   . Hyperlipidemia   . Hypertension   . Mixed dyslipidemia   . Myocardial infarction ([place])   . Nocturia   . Prostate cancer ([place]) 08/18/11   gleason 8, volume 24.4cc  . S/P CABG x 4   . ST elevation MI (STEMI) ([place]) 02-10-2008   S/P CABG  . Urinary hesitancy    Past Surgical History:  Past Surgical History:  Procedure Laterality Date  . CARDIAC CATHETERIZATION  05/01/2012   grafts widely patent  . CARDIOVASCULAR STRESS TEST  03-15-2010   INFERIOR WALL SCAR WITHOUT ANY MEANINGFUL ISCHEMIA/ EF 46% / LOW RISK SCAN  . CORONARY ARTERY BYPASS GRAFT  02-10-2008  DR Einar Gip   X4 VESSEL DISEASE / [facility] CABG  . CYSTOSCOPY  02/08/2012   Procedure: CYSTOSCOPY FLEXIBLE;  Surgeon: Franchot Gallo, MD;  Location: [facility];  Service: Urology;;  . KNEE SURGERY  age 61   RIGHT  . LEFT HEART CATHETERIZATION WITH CORONARY/GRAFT ANGIOGRAM N/A 05/01/2012   Procedure: LEFT HEART CATHETERIZATION WITH Beatrix Fetters;  Surgeon: Sanda Klein, MD;  Location: [place] CATH LAB;  Service: Cardiovascular;  Laterality: N/A;  .  MULTIPLE TEETH EXTRACTIONS (23)/ FOUR QUADRANT ALVEOLPLASTY/ MANDIBULAR LATERAL EXOSTOSES REDUCTIONS  03-24-2010   CHRONIC PERIODONTITIS  . RADIOACTIVE SEED IMPLANT  02/08/2012   Procedure: RADIOACTIVE SEED IMPLANT;  Surgeon: Franchot Gallo, MD;  Location: [facility];  Service: Urology;  Laterality: N/A;  C-ARM   . SHOULDER SURGERY  1992   LEFT   HPI:  [name] is a 65 y.o. male presenting with R eye vision loss. PMH is significant for inferior MI and CABG in 2009, obesity, tobacco abuse, prostate cancer.   Assessment / Plan / Recommendation Clinical Impression  Pt's speech, language and cognition appear to be intact and functional. There are no recommendations for follow up with speech therapy.     SLP Assessment  Patient does not need any further Speech Lanaguage Pathology Services    Follow Up Recommendations    none              SLP Evaluation Prior Functioning  Cognitive/Linguistic Baseline: Within functional limits Type of Home: House  Lives With: Spouse Available Help at Discharge: Family;Available 24 hours/day Vocation: Retired   Associate Professor  Overall Cognitive Status: Within Functional Limits for tasks assessed Arousal/Alertness: Awake/alert Orientation Level: Oriented X4 Attention: Divided Divided Attention: Appears intact Memory: Appears intact Awareness: Appears intact Problem Solving: Appears intact Executive Function: Self Monitoring Self Monitoring: Appears intact Safety/Judgment: Appears intact    Comprehension  Auditory Comprehension Overall Auditory Comprehension: Appears within functional limits for tasks assessed Yes/No  Questions: Within Functional Limits Commands: Within Functional Limits Conversation: Complex Visual Recognition/Discrimination Discrimination: Within Function Limits Reading Comprehension Reading Status: Within funtional limits    Expression Expression Primary Mode of Expression: Verbal Verbal  Expression Overall Verbal Expression: Appears within functional limits for tasks assessed Initiation: No impairment Level of Generative/Spontaneous Verbalization: Conversation Repetition: No impairment Naming: No impairment Pragmatics: No impairment Non-Verbal Means of Communication: Not applicable Written Expression Dominant Hand: Right Written Expression: Within Functional Limits   Oral / Motor  Oral Motor/Sensory Function Overall Oral Motor/Sensory Function: Within functional limits Motor Speech Overall Motor Speech: Appears within functional limits for tasks assessed Respiration: Within functional limits Phonation: Normal Resonance: Within functional limits Articulation: Within functional limitis Intelligibility: Intelligible Motor Planning: Witnin functional limits Motor Speech Errors: Aware   GO                    Charlynne Cousins [name] 08/12/2016, 10:20 AM

## 2016-08-12 NOTE — Progress Notes (Signed)
VASCULAR LAB PRELIMINARY  PRELIMINARY  PRELIMINARY  PRELIMINARY  Carotid duplex completed.    Preliminary report:  1-39% ICA plaquing.  Vertebral artery flow is antegrade.   Onetha Gaffey, RVT 08/12/2016, 5:46 PM

## 2016-08-12 NOTE — Evaluation (Signed)
Occupational Therapy Evaluation/Discharge Patient Details Name: [name] MRN: [id number] DOB: 1951-09-16 Today's Date: 08/12/2016    History of Present Illness  65 y.o. male presenting with R eye vision loss. PMH is significant for inferior MI and CABG in 2009, obesity, tobacco abuse, prostate cancer. MRI ordered.   Clinical Impression   Patient has been evaluated by Occupational Therapy with no acute OT needs identified. Pt able to complete all ADLs and mobility independently and demonstrated no functional visual limitations during session. With more formal visual testing pt also did not demonstrate any deficits and pt reported that his vision was complete restored and back to his baseline. Encouraged pt to follow up with his neuro ophthalmologist for which he already has an appt. All education has been completed and pt has no further questions. Pt with no further acute OT needs. OT signing off.    Follow Up Recommendations  No OT follow up    Equipment Recommendations  None recommended by OT    Recommendations for Other Services  (Follow-up with Neuro Ophthalmologist (pt already has appt))     Precautions / Restrictions Precautions Precautions: None Restrictions Weight Bearing Restrictions: No      Mobility Bed Mobility Overal bed mobility: Independent                Transfers Overall transfer level: Independent Equipment used: None                  Balance Overall balance assessment: No apparent balance deficits (not formally assessed)                                          ADL Overall ADL's : Modified independent                                             Vision Vision Assessment?: Yes Eye Alignment: Within Functional Limits Ocular Range of Motion: Within Functional Limits Alignment/Gaze Preference: Within Defined Limits Tracking/Visual Pursuits: Able to track stimulus in all quads without  difficulty Saccades: Within functional limits Convergence: Within functional limits Visual Fields: No apparent deficits Additional Comments: Pt reported 2 episodes of losing R eye vision. 1st episode pt reports "everything went black" and 2nd episode pt reported that his central vision was pink and starburst-like ("like looking into a light"), but he was able to see all objects in bilateral peripheral fields. However, now pt's vision deficits have resolved. Pt demonstrated no visual deficits with some nystagmus noted with horizontal tracking.   Perception Perception Perception Tested?: Yes Perception Deficits: Inattention/neglect Inattention/Neglect: Appears intact   Praxis Praxis Praxis tested?: Within functional limits    Pertinent Vitals/Pain Pain Assessment: No/denies pain     Hand Dominance Right   Extremity/Trunk Assessment Upper Extremity Assessment Upper Extremity Assessment: Overall WFL for tasks assessed   Lower Extremity Assessment Lower Extremity Assessment: Overall WFL for tasks assessed       Communication Communication Communication: No difficulties   Cognition Arousal/Alertness: Awake/alert Behavior During Therapy: WFL for tasks assessed/performed Overall Cognitive Status: Within Functional Limits for tasks assessed                     General Comments       Exercises  Shoulder Instructions      Home Living Family/patient expects to be discharged to:: Private residence Living Arrangements: Spouse/significant other Available Help at Discharge: Family;Available 24 hours/day Type of Home: House Home Access: Level entry     Home Layout: One level     Bathroom Shower/Tub: Teacher, early years/pre: Standard     Home Equipment: None          Prior Functioning/Environment Level of Independence: Independent             OT Diagnosis: Disturbance of vision   OT Problem List: Impaired vision/perception   OT  Treatment/Interventions:      OT Goals(Current goals can be found in the care plan section) Acute Rehab OT Goals Patient Stated Goal: home OT Goal Formulation: With patient Time For Goal Achievement: 08/26/16 Potential to Achieve Goals: Good  OT Frequency:     Barriers to D/C:            Co-evaluation              End of Session Nurse Communication: Mobility status  Activity Tolerance: Patient tolerated treatment well Patient left: in bed;with call bell/phone within reach   Time: 1400-1424 OT Time Calculation (min): 24 min Charges:  OT General Charges $OT Visit: 1 Procedure OT Evaluation $OT Eval Low Complexity: 1 Procedure OT Treatments $Self Care/Home Management : 8-22 mins G-Codes:    Redmond Baseman, OTR/L Pager: [phone] 08/12/2016, 3:53 PM

## 2016-08-12 NOTE — Progress Notes (Signed)
Received phone call from patient's daughter - Harrison Mons who works at [employer]. Concern over patient reporting an episode of blindness and that Dr. Zigmund Daniel has requested an urgent visit with the patient's cardiologist - Dr. Sallyanne Kuster - First available OV is on Wednesday at [facility]. No CT scan has been completed at the time of this phone call - advised to proceed to the ER at [facility] for possible code stroke. She was very appreciate of my help and is proceeding on to the ER for further evaluation.   Burtis Junes, RN, [place] [address] [place], [place]  [phone]

## 2016-08-12 NOTE — Evaluation (Signed)
Physical Therapy Evaluation Patient Details Name: [name] MRN: [id number] DOB: [birth year] Today's Date: 08/12/2016   History of Present Illness   65 y.o. male presenting with R eye vision loss. PMH is significant for inferior MI and CABG in 2009, obesity, tobacco abuse, prostate cancer. MRI ordered.  Clinical Impression  Pt is independent with all functional mobility. No PT intervention indicated. PT signing off.    Follow Up Recommendations No PT follow up    Equipment Recommendations  None recommended by PT    Recommendations for Other Services       Precautions / Restrictions Precautions Precautions: None      Mobility  Bed Mobility Overal bed mobility: Independent                Transfers Overall transfer level: Independent Equipment used: None                Ambulation/Gait Ambulation/Gait assistance: Independent Ambulation Distance (Feet): 350 Feet Assistive device: None Gait Pattern/deviations: Step-through pattern   Gait velocity interpretation: >2.62 ft/sec, indicative of independent Tourist information centre manager Rankin (Stroke Patients Only) Modified Rankin (Stroke Patients Only) Pre-Morbid Rankin Score: No symptoms Modified Rankin: No symptoms     Balance                                 Standardized Balance Assessment Standardized Balance Assessment : Dynamic Gait Index   Dynamic Gait Index Level Surface: Normal Change in Gait Speed: Normal Gait with Horizontal Head Turns: Normal Gait with Vertical Head Turns: Normal Gait and Pivot Turn: Normal Step Over Obstacle: Normal Step Around Obstacles: Normal Steps: Mild Impairment Total Score: 23       Pertinent Vitals/Pain Pain Assessment: No/denies pain    Home Living Family/patient expects to be discharged to:: Private residence Living Arrangements: Spouse/significant other Available Help at Discharge:  Family;Available 24 hours/day Type of Home: House Home Access: Level entry     Home Layout: One level Home Equipment: None      Prior Function Level of Independence: Independent               Hand Dominance        Extremity/Trunk Assessment   Upper Extremity Assessment: Overall WFL for tasks assessed           Lower Extremity Assessment: Overall WFL for tasks assessed      Cervical / Trunk Assessment: Normal  Communication   Communication: No difficulties  Cognition Arousal/Alertness: Awake/alert Behavior During Therapy: WFL for tasks assessed/performed Overall Cognitive Status: Within Functional Limits for tasks assessed                      General Comments      Exercises        Assessment/Plan    PT Assessment Patent does not need any further PT services  PT Diagnosis Difficulty walking   PT Problem List    PT Treatment Interventions     PT Goals (Current goals can be found in the Care Plan section) Acute Rehab PT Goals Patient Stated Goal: home PT Goal Formulation: All assessment and education complete, DC therapy    Frequency     Barriers to discharge        Co-evaluation  End of Session   Activity Tolerance: Patient tolerated treatment well Patient left: in chair;with nursing/sitter in room Nurse Communication: Mobility status         Time: [id number] PT Time Calculation (min) (ACUTE ONLY): 12 min   Charges:   PT Evaluation $PT Eval Moderate Complexity: 1 Procedure     PT G Codes:        Lorriane Shire 08/12/2016, 9:55 AM

## 2016-08-13 ENCOUNTER — Inpatient Hospital Stay (HOSPITAL_COMMUNITY): Payer: BLUE CROSS/BLUE SHIELD

## 2016-08-13 DIAGNOSIS — H53131 Sudden visual loss, right eye: Secondary | ICD-10-CM

## 2016-08-13 DIAGNOSIS — I6789 Other cerebrovascular disease: Secondary | ICD-10-CM

## 2016-08-13 LAB — BASIC METABOLIC PANEL
ANION GAP: 10 (ref 5–15)
BUN: 10 mg/dL (ref 6–20)
CALCIUM: 9.2 mg/dL (ref 8.9–10.3)
CO2: 23 mmol/L (ref 22–32)
Chloride: 103 mmol/L (ref 101–111)
Creatinine, Ser: 0.65 mg/dL (ref 0.61–1.24)
GFR calc non Af Amer: >60 mL/min (ref >60)
GLUCOSE: 216 mg/dL — AB (ref 65–99)
POTASSIUM: 3.7 mmol/L (ref 3.5–5.1)
Sodium: 136 mmol/L (ref 135–145)

## 2016-08-13 LAB — GLUCOSE, CAPILLARY
GLUCOSE-CAPILLARY: 295 mg/dL — AB (ref 65–99)
GLUCOSE-CAPILLARY: 323 mg/dL — AB (ref 65–99)
Glucose-Capillary: 238 mg/dL — ABNORMAL HIGH (ref 65–99)
Glucose-Capillary: 271 mg/dL — ABNORMAL HIGH (ref 65–99)

## 2016-08-13 LAB — CBC
HEMATOCRIT: 46.8 % (ref 39.0–52.0)
HEMOGLOBIN: 15.8 g/dL (ref 13.0–17.0)
MCH: 28.9 pg (ref 26.0–34.0)
MCHC: 33.8 g/dL (ref 30.0–36.0)
MCV: 85.7 fL (ref 78.0–100.0)
Platelets: 197 10*3/uL (ref 150–400)
RBC: 5.46 MIL/uL (ref 4.22–5.81)
RDW: 13.5 % (ref 11.5–15.5)
WBC: 11.4 10*3/uL — AB (ref 4.0–10.5)

## 2016-08-13 LAB — ECHOCARDIOGRAM COMPLETE
HEIGHTINCHES: 71 in
Weight: 3584 oz

## 2016-08-13 LAB — HEMOGLOBIN A1C
Hgb A1c MFr Bld: 10.6 % — ABNORMAL HIGH (ref 4.8–5.6)
Mean Plasma Glucose: 258 mg/dL

## 2016-08-13 MED ORDER — PERFLUTREN LIPID MICROSPHERE
INTRAVENOUS | Status: AC
  Filled 2016-08-13: qty 10

## 2016-08-13 MED ORDER — PERFLUTREN LIPID MICROSPHERE
4.0000 mL | INTRAVENOUS | Status: AC | PRN
  Filled 2016-08-13: qty 10

## 2016-08-13 NOTE — Progress Notes (Addendum)
Echocardiogram 2D Echocardiogram with contrast has been performed.  [name] 08/13/2016, 9:25 AM

## 2016-08-13 NOTE — Progress Notes (Signed)
    TEE requested by primary team. Will relay to cardmaster, [place].   Candee Furbish, MD

## 2016-08-13 NOTE — Progress Notes (Signed)
Family Medicine Teaching Service Daily Progress Note Intern Pager: [phone]  Patient name: [name] Medical record number: [id number] Date of birth: 16-Aug-1951 Age: 65 y.o. Gender: male  Primary Care Provider: No PCP Per Patient Consultants: neurology Code Status: FULL  Pt Overview and Major Events to Date:  No acute events overnight. Pt would like to go home today if possible.  Assessment and Plan: ARATH KAIGLER a 65 y.o.malepresenting with R eye vision loss. PMH is significant for inferior MI and CABG in 2009, obesity, tobacco abuse, prostate cancer.  #R frontal subacute infarct, R vision loss: Pt reports intermittent R sided vision loss x 3 days, took 356m aspirin today. Vision is returning in R eye per pt report. Pt with history of CAD, CABG, but does not take any medicines daily. Does not see any PCP. CT head showed small subacute/old infarct in the right frontal region. On exam, he has decreased visual acuity in the upper, lower, and medial aspects of his visual field in the right eye. No other focal deficits. Pt has multiple risk factors for stroke including HTN, DM, HLD, tobacco abuse, obesity. ESR not elevated at 6, CRP mildly elevated at 1.1. Total cholesterol 161, Triglycerides elevated at 332, HDL low, LDL WNL. TSH normal. MRA At least 3 areas of acute, nonhemorrhagic cortical infarction, affecting the RIGHT hemisphere in the posterior frontal cortex and occipital cortex consistent with a shower of emboli. Carotid [place]- 1-39% ICA plaquing prelim read. Trop negx1. No need to trend, as Pt denies chest pain and EKG without signs of ischemia/infarct. PT/OT/SLP no additional f/u needed. -neurology consulted, appreciate recs -32% risk of cardiovascular event in the next 10 years if HgA1C does indicate diabetes -HbA1C pending -consider adding ace-inhibitor for BP control. Permissive hypertension x 48 hours for now. -echo ordered, called to inform of imminent discharge -ASA  837mQD -Will start Lipitor 4059maily -cardiac monitoring -nicotine patch, continued smoking cessation counseling - Neuro checks q4hrs -Vit B12 per neuro - 318 -RPR nonreactive  #CAD: inferior MI in 2009, CABG 2009, graft patent on cath 2013. Pt reports he does not see a cardiologist regularly and does not take any medicines.  -ASA as above -consider ace inhibitor as above -Will start Lipitor 67m35mily  #Tobacco abusePt smokes 1 pack per day x50 years (previously smoked 3 ppd until 2009). Reports he may be willing to cut down.  -nicotene patch -continued smoking cessation counseling  #Hyperglycemia: Glucose 243 on BMET. - A1c as above - Will likely need to start oral diabetic med this admission.  FEN/GI: heart healthy/carb-modifieddiet, as Pt passed his bedside swallow. Prophylaxis: subq heparin  Disposition: continued monitoring on tele  Subjective:  Pt resting comfortably this morning, would like to go home.   Objective: Temp:  [97.5 F (36.4 C)-98 F (36.7 C)] 97.5 F (36.4 C) (08/20 0100) Pulse Rate:  [67-111] 73 (08/20 0100) Resp:  [18] 18 (08/20 0100) BP: (98-142)/(51-88) 98/51 (08/20 0100) SpO2:  [91 %-97 %] 93 % (08/20 0100) Physical Exam: General: NAD, obese male. Cardiovascular: RRR, no m/r/g Respiratory: CTAB, no wheezes or crackles Abdomen: SNTND Extremities: bilateral chronic appearing venous stasis changes, no wounds Neuro: CN II-XII grossly intact  Laboratory:  Recent Labs Lab 08/11/16 1703 08/12/16 0048 08/13/16 0241  WBC 12.3* 12.6* 11.4*  HGB 17.0 15.9 15.8  HCT 50.7 47.4 46.8  PLT 198 202 197    Recent Labs Lab 08/11/16 1703 08/12/16 0048 08/13/16 0241  NA 135  --  136  K 4.3  --  3.7  CL 100*  --  103  CO2 27  --  23  BUN 9  --  10  CREATININE 0.71 0.68 0.65  CALCIUM 9.5  --  9.2  PROT 7.6  --   --   BILITOT 0.5  --   --   ALKPHOS 107  --   --   ALT 29  --   --   AST 20  --   --   GLUCOSE 243*  --  216*     Imaging/Diagnostic Tests: Mr Brain Wo Contrast  Result Date: 08/12/2016 CLINICAL DATA:  Acute RIGHT monocular visual loss beginning 8/15. Some improvement over the past several days. History of hypertension, diabetes, and tobacco abuse. EXAM: MRI HEAD WITHOUT CONTRAST MRA HEAD WITHOUT CONTRAST TECHNIQUE: Multiplanar, multiecho pulse sequences of the brain and surrounding structures were obtained without intravenous contrast. Angiographic images of the head were obtained using MRA technique without contrast. COMPARISON:  CT head 08/11/2016 FINDINGS: The patient was unable to remain motionless for the exam. Small or subtle lesions could be overlooked. MRI HEAD FINDINGS At least 3 areas of acute infarction, sub cm size, affect the RIGHT lateral occipital cortex, RIGHT medial occipital cortex, and RIGHT posterior frontal cortex is documented on axial and coronal diffusion weighted imaging. These likely represent a shower of emboli. No LEFT-sided or posterior fossa areas of acute infarction are seen. In the RIGHT orbit, there is asymmetric restricted diffusion involving the distal optic nerve on the RIGHT. See axial image 20 series 5 and coronal image 26 series 9. Acute RIGHT optic nerve infarction is not excluded. No hemorrhage, mass lesion, hydrocephalus, or extra-axial fluid. Premature for age atrophy. Mild subcortical and periventricular T2 and FLAIR hyperintensities, likely chronic microvascular ischemic change. Chronic RIGHT posterior frontal cortical and subcortical infarction with gliosis, chronic blood products, and encephalomalacia. Remote LEFT pontine lacunar infarct. Flow voids are maintained. No other foci of chronic hemorrhage. No midline abnormality. Extracranial soft tissues unremarkable. MRA HEAD FINDINGS The internal carotid arteries are widely patent. The basilar artery is widely patent with both vertebrals contributing. There is no proximal stenosis of the anterior, middle, or posterior  cerebral arteries. No cerebellar branch occlusion. Insufficient visualization of the ophthalmic arteries bilaterally, to confirm or exclude patency. No intracranial saccular aneurysm is evident. IMPRESSION: At least 3 areas of acute, nonhemorrhagic cortical infarction, affecting the RIGHT hemisphere in the posterior frontal cortex and occipital cortex consistent with a shower of emboli. Asymmetric restricted diffusion involving the distal optic nerve on the RIGHT, suggesting acute infarction, correlating with anterior ischemic optic neuropathy. Premature atrophy with mild chronic microvascular ischemic change. Remote cortical infarct, RIGHT posterior frontal region. No intracranial flow reducing lesion on MRI  is evident. Electronically Signed   By: Staci Righter M.D.   On: 08/12/2016 11:50   Mr Jodene Nam Head/brain [facility] Cm  Result Date: 08/12/2016 CLINICAL DATA:  Acute RIGHT monocular visual loss beginning 8/15. Some improvement over the past several days. History of hypertension, diabetes, and tobacco abuse. EXAM: MRI HEAD WITHOUT CONTRAST MRA HEAD WITHOUT CONTRAST TECHNIQUE: Multiplanar, multiecho pulse sequences of the brain and surrounding structures were obtained without intravenous contrast. Angiographic images of the head were obtained using MRA technique without contrast. COMPARISON:  CT head 08/11/2016 FINDINGS: The patient was unable to remain motionless for the exam. Small or subtle lesions could be overlooked. MRI HEAD FINDINGS At least 3 areas of acute infarction, sub cm size, affect the RIGHT lateral occipital cortex,  RIGHT medial occipital cortex, and RIGHT posterior frontal cortex is documented on axial and coronal diffusion weighted imaging. These likely represent a shower of emboli. No LEFT-sided or posterior fossa areas of acute infarction are seen. In the RIGHT orbit, there is asymmetric restricted diffusion involving the distal optic nerve on the RIGHT. See axial image 20 series 5 and coronal image  26 series 9. Acute RIGHT optic nerve infarction is not excluded. No hemorrhage, mass lesion, hydrocephalus, or extra-axial fluid. Premature for age atrophy. Mild subcortical and periventricular T2 and FLAIR hyperintensities, likely chronic microvascular ischemic change. Chronic RIGHT posterior frontal cortical and subcortical infarction with gliosis, chronic blood products, and encephalomalacia. Remote LEFT pontine lacunar infarct. Flow voids are maintained. No other foci of chronic hemorrhage. No midline abnormality. Extracranial soft tissues unremarkable. MRA HEAD FINDINGS The internal carotid arteries are widely patent. The basilar artery is widely patent with both vertebrals contributing. There is no proximal stenosis of the anterior, middle, or posterior cerebral arteries. No cerebellar branch occlusion. Insufficient visualization of the ophthalmic arteries bilaterally, to confirm or exclude patency. No intracranial saccular aneurysm is evident. IMPRESSION: At least 3 areas of acute, nonhemorrhagic cortical infarction, affecting the RIGHT hemisphere in the posterior frontal cortex and occipital cortex consistent with a shower of emboli. Asymmetric restricted diffusion involving the distal optic nerve on the RIGHT, suggesting acute infarction, correlating with anterior ischemic optic neuropathy. Premature atrophy with mild chronic microvascular ischemic change. Remote cortical infarct, RIGHT posterior frontal region. No intracranial flow reducing lesion on MRI  is evident. Electronically Signed   By: Staci Righter M.D.   On: 08/12/2016 11:50     Sela Hilding, MD 08/13/2016, 4:40 AM PGY-1, [place] Intern pager: [phone], text pages welcome

## 2016-08-13 NOTE — Progress Notes (Addendum)
STROKE TEAM PROGRESS NOTE   HISTORY OF PRESENT ILLNESS (per record)  This is a 65-yo RH man who was sent to the ED by his ophthalmologist this afternoon for evaluation of retinal ischemia. History is obtained directly from the patient who is an excellent historian. His wife is present at the bedside and offers additional information as needed.   The patient reports that he and his wife were driving to the grocery store on the morning of 08/08/16 when he suddenly lost vision in his right eye. He states that everything went black. He continued driving and reached the store where they sat in the car for a bit. His vision eventually returned to normal after about 20 minutes and they finished their grocery shopping and returned home. On the afternoon of 08/09/16, he again lost the vision in his right eye but this time it did not return. He tracked down the contact information for his optometrist and went to see him. He states he was told that his eye looked okay on his exam and he referred him to a retinal specialist. He was seen there today and was found to have evidence of retinal ischemia in the right eye per the report of the patient and his wife--I do not have notes from the ophthalmologist at the time of my visit. He was sent to the ED for further management. Since his arrival in the ED, he has noted steady improvement in the vision in his right eye.   The patient states that his symptoms were limited to total vision loss in his right eye. He did not have any vision loss in the left eye. He denies any associated headache. He had no weakness,  double vision, vertigo, trouble talking, trouble swallowing, dyscoordination, balance problems, or changes in his gait. He has longstanding tingling in the soles of both feet but reports no new sensory changes. He reports no prior history of stroke.     SUBJECTIVE (INTERVAL HISTORY) His wife was at the bedside.  Overall he feels his condition is completely  resolved. He denies visual changes of any kind.  Full ROS was negative accept he did mention today that in the last month he has noticed an intentional tremor in the right hand which was temporally not related to current event.  Today he did note this as well.   We discussed his MRI and overall care plan.  He apparently has spoken to his daughters who work in healthcare and seems to have a good understanding of TEE and LOOP    OBJECTIVE Temp:  [97.5 F (36.4 C)-97.8 F (36.6 C)] 97.8 F (36.6 C) (08/20 0500) Pulse Rate:  [67-111] 70 (08/20 0500) Cardiac Rhythm: Normal sinus rhythm (08/19 1900) Resp:  [18] 18 (08/20 0500) BP: (98-142)/(51-88) 123/61 (08/20 0500) SpO2:  [91 %-97 %] 94 % (08/20 0500)  CBC:   Recent Labs Lab 08/11/16 1703 08/12/16 0048 08/13/16 0241  WBC 12.3* 12.6* 11.4*  NEUTROABS 6.0  --   --   HGB 17.0 15.9 15.8  HCT 50.7 47.4 46.8  MCV 86.2 86.3 85.7  PLT 198 202 [id number]    Basic Metabolic Panel:   Recent Labs Lab 08/11/16 1703 08/12/16 0048 08/13/16 0241  NA 135  --  136  K 4.3  --  3.7  CL 100*  --  103  CO2 27  --  23  GLUCOSE 243*  --  216*  BUN 9  --  10  CREATININE 0.71 0.68 0.65  CALCIUM 9.5  --  9.2    Lipid Panel:     Component Value Date/Time   CHOL 161 08/12/2016 0048   TRIG 332 (H) 08/12/2016 0048   HDL 24 (L) 08/12/2016 0048   CHOLHDL 6.7 08/12/2016 0048   VLDL 66 (H) 08/12/2016 0048   LDLCALC 71 08/12/2016 0048   HgbA1c:  Lab Results  Component Value Date   HGBA1C (H) 02/14/2008    6.2 (NOTE)   The ADA recommends the following therapeutic goals for glycemic   control related to Hgb A1C measurement:   Goal of Therapy:   < 7.0% Hgb A1C   Action Suggested:  > 8.0% Hgb A1C   Ref:  Diabetes Care, 22, Suppl. 1, 1999   Urine Drug Screen: No results found for: LABOPIA, [place], [place], [place], [facility], LABBARB    IMAGING  MRI Wo Contrast 08/12/2016 At least 3 areas of acute, nonhemorrhagic cortical infarction, affecting  the RIGHT hemisphere in the posterior frontal cortex and occipital cortex consistent with a shower of emboli.  Asymmetric restricted diffusion involving the distal optic nerve on the RIGHT, suggesting acute infarction, correlating with anterior ischemic optic neuropathy.  Premature atrophy with mild chronic microvascular ischemic change. Remote cortical infarct, RIGHT posterior frontal region.  No intracranial flow reducing lesion on MRI  is evident.   MRA Wo Contrast 08/12/2016 The patient was unable to remain motionless for the exam. Small or subtle lesions could be overlooked. The internal carotid arteries are widely patent. The basilar artery is widely patent with both vertebrals contributing.  There is no proximal stenosis of the anterior, middle, or posterior cerebral arteries. No cerebellar branch occlusion.  Insufficient visualization of the ophthalmic arteries bilaterally, to confirm or exclude patency.  No intracranial saccular aneurysm is evident.    Ct Head Wo Contrast 08/11/2016  Small cortical infarct at the high RIGHT frontal region, favor subacute to old in age due the lack of edema/mass effect.  No other intracranial abnormalities identified.    MRI Brain Wo Contrast -08/11/2016 At least 3 areas of acute, nonhemorrhagic cortical infarction, affecting the RIGHT hemisphere in the posterior frontal cortex and occipital cortex consistent with a shower of emboli.  Asymmetric restricted diffusion involving the distal optic nerve on the RIGHT, suggesting acute infarction, correlating with anterior ischemic optic neuropathy.  Premature atrophy with mild chronic microvascular ischemic change. Remote cortical infarct, RIGHT posterior frontal region.  No intracranial flow reducing lesion on MRI  is evident.   PHYSICAL EXAM  General: WD obese Caucasian man lying on ED gurney, no acute distress. AAO x4. Speech clear, no dysarthria. No aphasia. Follows commands  briskly. Affect is bright with congruent mood. Comportment is normal.  HEENT: Normocephalic. Neck supple without LAD. MMM, OP clear. Dentures in place. Sclerae anicteric. Mild conjunctival injection.  CV: Regular, distant, no obvious murmur. Carotid pulses full and symmetric, no bruits. Distal pulses 2+ and symmetric.  Lungs: CTAB on anterior exam.  Abdomen: Soft, obese, non-distended, non-tender. Bowel sounds present x4.  Extremities: No C/C/E. Skin over both lower legs is shiny and hyperpigmented with dry flaky skin over both feet. Neuro:  CN: Pupils are mildly unequal with the R pupil 2-->1 mm and the L pupil 3-->2 mm. Visual fields are intact to confrontation. EOMI notable for breakup of smooth pursuits in all directions. He has some hypermetric saccades but no nystagmus. No reported diplopia. Facial sensation is intact to light touch. Face is symmetric at rest with normal strength and mobility. Hearing is intact  to conversational voice. Palate elevates symmetrically and uvula is midline. Voice is normal in tone, pitch and quality. Bilateral SCM and trapezii are 5/5. Tongue is midline with normal bulk and mobility.  Motor: Normal bulk, tone, and strength throughout with the exception of 4+/5 strength in the R triceps, wrist extensors, finger extensors and grip. No tremor or other abnormal movements. No drift.  Sensation: Intact to light touch and pinprick. Vibration is diminished in BLE to above the knee, worse on the right than the left. Proprioception in both great toes is normal.  DTRs: 2+, symmetric with absent ankle jerks bilaterally. Toes downgoing bilaterally. No pathologic reflexes.  Coordination: Finger-to-nose and heel-to-shin are without dysmetria. When asked to touch his nose with eyes closed, he misses with the right hand. Finger taps are normal in amplitude and speed, no decrement.   ASSESSMENT/PLAN Mr. HUSSIEN BIBA is a 65 y.o. male with history of coronary artery disease with  previous MI as well as coronary artery bypass graft surgery, tobacco history, dyslipidemia, hypertension, hyperglycemia, and chronic venous insufficiency,  presenting with transient vision loss right eye.  He did not receive IV t-PA due to late presentation.  Stroke:  Non-dominant - Small cortical infarct at the high RIGHT frontal region - probably emboli - unknown source.  Resultant transient visual loss MRI  - At least 3 areas of acute, nonhemorrhagic cortical infarction, affecting the RIGHT hemisphere in the posterior frontal cortex and occipital cortex consistent with a shower of emboli.  Asymmetric restricted diffusion involving the distal optic nerve on the RIGHT, suggesting acute infarction, correlating with anterior ischemic optic neuropathy.  Premature atrophy with mild chronic microvascular ischemic change. Remote cortical infarct, RIGHT posterior frontal region.  No intracranial flow reducing lesion on MRI  is evident.     MRA - unremarkable as above. The patient was unable to remain motionless for the study.  Carotid Doppler - Preliminary report:  1-39% ICA plaquing.  Vertebral artery flow is antegrade.   2D Echo - pending  LDL - 71; however, very high TGs  HgbA1c - pending  VTE prophylaxis -  subcutaneous heparin Diet heart healthy/carb modified Room service appropriate? Yes; Fluid consistency: Thin  aspirin 325 mg daily prior to admission, now on aspirin 81 mg daily  Patient counseled to be compliant with his antithrombotic medications  Ongoing aggressive stroke risk factor management  Therapy recommendations:  No follow-up physical therapy recommended. OT evaluation pending.  Disposition:  - pending  Hypertension  Stable  Permissive hypertension (OK if < 220/120) but gradually normalize in 5-7 days  Long-term BP goal normotensive  Hyperlipidemia  Home meds: No lipid lowering medications prior to admission.  LDL 71, goal <  70   Diabetes  HgbA1c pending, goal < 7.0  Uncontrolled ( steroid dose pack PTA )   Other Stroke Risk Factors  Advanced age  Cigarette smoker - advised to stop smoking  Occasional ETOH use, advised to drink no more than 1 drink a day  Obesity, Body mass index is 31.24 kg/m., recommend weight loss, diet and exercise as appropriate   Coronary artery disease    Other Active Problems  Mild leukocytosis - 12.6  ( steroid dose pack PTA )   Consider TEE loop on Monday to look for embolic source. Sinus rhythm on telemetry. (Empiric anticoagulation not recommended without documentation of embolic etiology) Consider hypercoagulable labs. Also has hx of prostate CA.   Hospital day # 2  ATTENDING NOTE: Patient was seen and examined by  me personally. Documentation reflects findings. The laboratory and radiographic studies reviewed by me. ROS completed by me personally and pertinent positives fully documented  Condition: stable  Assessment and plan completed by me personally and fully documented above. Plans/Recommendations include:     Stroke work up ongoing  Recommend TG treatment as well as treatment for LDL >70  Patient should follow-up with ophth after discharge  Encouraged smoking cessation  Will follow after TEE and possible LOOP.  Primary team contacted and they plan to order  SIGNED BY: Dr. Elissa Hefty      To contact Stroke Continuity provider, please refer to [email or web address] After hours, contact General Neurology

## 2016-08-14 ENCOUNTER — Encounter (HOSPITAL_COMMUNITY)
Admission: EM | Disposition: A | Discharge status: Home / Self Care | Payer: Self-pay | Source: Home / Self Care | Attending: Family Medicine

## 2016-08-14 ENCOUNTER — Inpatient Hospital Stay (HOSPITAL_COMMUNITY): Payer: BLUE CROSS/BLUE SHIELD

## 2016-08-14 ENCOUNTER — Encounter (HOSPITAL_COMMUNITY): Payer: Self-pay | Admitting: *Deleted

## 2016-08-14 ENCOUNTER — Encounter: Payer: Self-pay | Admitting: Surgery

## 2016-08-14 DIAGNOSIS — I63442 Cerebral infarction due to embolism of left cerebellar artery: Secondary | ICD-10-CM

## 2016-08-14 DIAGNOSIS — I63312 Cerebral infarction due to thrombosis of left middle cerebral artery: Secondary | ICD-10-CM

## 2016-08-14 DIAGNOSIS — I639 Cerebral infarction, unspecified: Secondary | ICD-10-CM

## 2016-08-14 DIAGNOSIS — I340 Nonrheumatic mitral (valve) insufficiency: Secondary | ICD-10-CM

## 2016-08-14 HISTORY — PX: TEE WITHOUT CARDIOVERSION: SHX5443

## 2016-08-14 HISTORY — PX: EP IMPLANTABLE DEVICE: SHX172B

## 2016-08-14 LAB — GLUCOSE, CAPILLARY: GLUCOSE-CAPILLARY: 256 mg/dL — AB (ref 65–99)

## 2016-08-14 LAB — CBC
HCT: 47.3 % (ref 39.0–52.0)
HEMOGLOBIN: 15.8 g/dL (ref 13.0–17.0)
MCH: 28.6 pg (ref 26.0–34.0)
MCHC: 33.4 g/dL (ref 30.0–36.0)
MCV: 85.5 fL (ref 78.0–100.0)
Platelets: 186 10*3/uL (ref 150–400)
RBC: 5.53 MIL/uL (ref 4.22–5.81)
RDW: 13.4 % (ref 11.5–15.5)
WBC: 11.3 10*3/uL — AB (ref 4.0–10.5)

## 2016-08-14 LAB — BASIC METABOLIC PANEL
Anion gap: 9 (ref 5–15)
BUN: 10 mg/dL (ref 6–20)
CHLORIDE: 104 mmol/L (ref 101–111)
CO2: 22 mmol/L (ref 22–32)
CREATININE: 0.65 mg/dL (ref 0.61–1.24)
Calcium: 9.1 mg/dL (ref 8.9–10.3)
GFR calc non Af Amer: >60 mL/min (ref >60)
Glucose, Bld: 241 mg/dL — ABNORMAL HIGH (ref 65–99)
Potassium: 4.1 mmol/L (ref 3.5–5.1)
SODIUM: 135 mmol/L (ref 135–145)

## 2016-08-14 LAB — VAS US CAROTID
LCCADDIAS: -23 cm/s
LCCADSYS: -91 cm/s
LEFT ECA DIAS: -29 cm/s
LEFT VERTEBRAL DIAS: -8 cm/s
LICADDIAS: -17 cm/s
LICADSYS: -60 cm/s
LICAPDIAS: -18 cm/s
LICAPSYS: -62 cm/s
Left CCA prox dias: 23 cm/s
Left CCA prox sys: 107 cm/s
RCCADSYS: -80 cm/s
RCCAPSYS: -91 cm/s
RIGHT ECA DIAS: -12 cm/s
RIGHT VERTEBRAL DIAS: -15 cm/s
Right CCA prox dias: -14 cm/s

## 2016-08-14 SURGERY — ECHOCARDIOGRAM, TRANSESOPHAGEAL
Anesthesia: Moderate Sedation

## 2016-08-14 SURGERY — LOOP RECORDER INSERTION
Anesthesia: LOCAL

## 2016-08-14 MED ORDER — FENTANYL CITRATE (PF) 100 MCG/2ML IJ SOLN
INTRAMUSCULAR | Status: DC | PRN
  Administered 2016-08-14 (×2): 50 ug via INTRAVENOUS

## 2016-08-14 MED ORDER — LIDOCAINE-EPINEPHRINE 1 %-1:100000 IJ SOLN
INTRAMUSCULAR | Status: AC
  Filled 2016-08-14: qty 1

## 2016-08-14 MED ORDER — LIDOCAINE-EPINEPHRINE 1 %-1:100000 IJ SOLN
INTRAMUSCULAR | Status: DC | PRN
  Administered 2016-08-14: 20 mL via INTRADERMAL

## 2016-08-14 MED ORDER — FENTANYL CITRATE (PF) 100 MCG/2ML IJ SOLN
INTRAMUSCULAR | Status: AC
  Filled 2016-08-14: qty 2

## 2016-08-14 MED ORDER — MIDAZOLAM HCL 10 MG/2ML IJ SOLN
INTRAMUSCULAR | Status: DC | PRN
  Administered 2016-08-14 (×2): 2 mg via INTRAVENOUS

## 2016-08-14 MED ORDER — BUTAMBEN-TETRACAINE-BENZOCAINE 2-2-14 % EX AERO
INHALATION_SPRAY | CUTANEOUS | Status: DC | PRN
  Administered 2016-08-14: 2 via TOPICAL

## 2016-08-14 MED ORDER — ATORVASTATIN CALCIUM 40 MG PO TABS: 40.0000 mg | ORAL_TABLET | Freq: Every day | ORAL | 0 refills | Status: DC

## 2016-08-14 MED ORDER — MIDAZOLAM HCL 5 MG/ML IJ SOLN
INTRAMUSCULAR | Status: AC
  Filled 2016-08-14: qty 2

## 2016-08-14 MED ORDER — LIVING WELL WITH DIABETES BOOK
Freq: Once | Status: DC
  Filled 2016-08-14: qty 1

## 2016-08-14 MED ORDER — METFORMIN HCL 500 MG PO TABS: 500.0000 mg | ORAL_TABLET | Freq: Two times a day (BID) | ORAL | 0 refills | Status: DC

## 2016-08-14 MED ORDER — ASPIRIN 81 MG PO TBEC: 81.0000 mg | DELAYED_RELEASE_TABLET | Freq: Every day | ORAL | 0 refills | Status: DC

## 2016-08-14 SURGICAL SUPPLY — 2 items
LOOP REVEAL LINQSYS (Prosthesis & Implant Heart) ×1 IMPLANT
PACK LOOP INSERTION (CUSTOM PROCEDURE TRAY) ×2 IMPLANT

## 2016-08-14 NOTE — Progress Notes (Signed)
Family Medicine Teaching Service Daily Progress Note Intern Pager: [phone]  Patient name: [name] Medical record number: [id number] Date of birth: [birth year] Age: 65 y.o. Gender: male  Primary Care Provider: No PCP Per Patient Consultants: neurology, cardiology Code Status: FULL  Pt Overview and Major Events to Date:  Pt continues to experience burning pain in BLEs.   Assessment and Plan: [name] a 65 y.o.malepresenting with R eye vision loss. PMH is significant for inferior MI and CABG in 2009, obesity, tobacco abuse, prostate cancer.  #R frontal subacute infarct, R vision loss: Pt reports intermittent R sided vision loss x 3 days, took 355m aspirin today. Vision is returning in R eye per pt report. Pt with history of CAD, CABG, but does not take any medicines daily. Does not see any PCP. CT head showed small subacute/old infarct in the right frontal region. On exam, he has decreased visual acuity in the upper, lower, and medial aspects of his visual field in the right eye. No other focal deficits. Pt has multiple risk factors for stroke including HTN, DM, HLD, tobacco abuse, obesity. ESR not elevated at 6, CRP mildly elevated at 1.1. Total cholesterol 161, Triglycerides elevated at 332, HDL low, LDL WNL. TSH normal. MRA At least 3 areas of acute, nonhemorrhagic cortical infarction, affecting the RIGHT hemisphere in the posterior frontal cortex and occipital cortex consistent with a shower of emboli. Carotid [place]- 1-39% ICA plaquing prelim read. Trop negx1. No need to trend, as Pt denies chest pain and EKG without signs of ischemia/infarct. PT/OT/SLP no additional f/u needed. Echo moderately reduced systolic function, akinesis of the inferolateral myocardium, grade 1 diastolic dysfunction.  -neurology consulted, appreciate recs -32% risk of cardiovascular event in the next 10 years if HgA1C does indicate diabetes, which is likely based on persistently elevated random  CBGs -HbA1C 10.6 -ASA 820mQD -Will start Lipitor 4013maily -cardiac monitoring -TEE necessary in order to investigate source of emboli, hopefully happening today -nicotine patch, continued smoking cessation counseling - Neuro checks q4hrs -Vit B12 per neuro - 318 -RPR nonreactive  #CAD: inferior MI in 2009, CABG 2009, graft patent on cath 2013. Pt reports he does not see a cardiologist regularly and does not take any medicines.  -ASA as above -consider ace inhibitor as necessary for blood pressure -Will start Lipitor 80m58mily  #Tobacco abusePt smokes 1 pack per day x50 years (previously smoked 3 ppd until 2009). Reports he may be willing to cut down.  -nicotene patch -continued smoking cessation counseling  #Hyperglycemia: Glucose 243 on BMET on admission, repeat BMPs 216 and 241.  - A1c 10.6 - will start metformin as an outpatient, could consider insulin but pt has had a history of noncompliance - could consider gabapentin as an outpatient for neuropathic pain, but do not want to overwhelm pt with new meds at this time  FEN/GI: heart healthy/carb-modifieddiet, as Pt passed his bedside swallow. Prophylaxis: subq heparin  Disposition: dispo pending TEE  Subjective:  Pt resting comfortably, but continues to complain of bilateral lower extremity burning, this is chronic for him. He would not like to start gabapentin at this time 2/2 starting several new meds vs his previous lack of any medicines. He also mentions Dr. ElkiArelia SneddonPleaHaverhilla possible new PCP as he knows him as a friend.  Objective: Temp:  [97.6 F (36.4 C)-98.6 F (37 C)] 97.8 F (36.6 C) (08/21 0551) Pulse Rate:  [63-79] 71 (08/21 0551) Resp:  [  20] 20 (08/21 0551) BP: (101-125)/(60-74) 101/63 (08/21 0551) SpO2:  [94 %-97 %] 97 % (08/21 0551) Physical Exam: General: NAD, obese male. Cardiovascular: RRR, no m/r/g Respiratory: CTAB, no wheezes or crackles Abdomen:  SNTND Extremities: bilateral chronic appearing venous stasis changes, no wounds Neuro: CN II-XII grossly intact  Laboratory:  Recent Labs Lab 08/12/16 0048 08/13/16 0241 08/14/16 0455  WBC 12.6* 11.4* 11.3*  HGB 15.9 15.8 15.8  HCT 47.4 46.8 47.3  PLT 202 197 186    Recent Labs Lab 08/11/16 1703 08/12/16 0048 08/13/16 0241 08/14/16 0455  NA 135  --  136 135  K 4.3  --  3.7 4.1  CL 100*  --  103 104  CO2 27  --  23 22  BUN 9  --  10 10  CREATININE 0.71 0.68 0.65 0.65  CALCIUM 9.5  --  9.2 9.1  PROT 7.6  --   --   --   BILITOT 0.5  --   --   --   ALKPHOS 107  --   --   --   ALT 29  --   --   --   AST 20  --   --   --   GLUCOSE 243*  --  216* 241*    Marcus Hilding, MD 08/14/2016, 7:11 AM PGY-1, [place] Intern pager: [phone], text pages welcome

## 2016-08-14 NOTE — Progress Notes (Signed)
SLP Cancellation Note  Patient Details Name: NEAMIAH FREDERICKS MRN: [id number] DOB: Sep 14, 1951   Cancelled treatment:       Reason Eval/Treat Not Completed: SLP screened, no needs identified, will sign off   Juan Quam Laurice 08/14/2016, 3:32 PM

## 2016-08-14 NOTE — Progress Notes (Signed)
STROKE TEAM PROGRESS NOTE   SUBJECTIVE (INTERVAL HISTORY) His wife is at the bedside. Patient is lying in the bed, talkative. Awaiting TEE. He recounted HPI with Dr. Leonie [name]. Dr. Leonie [name] recounted his dx, prognosis, tx, plans for TEE and loop with pt and wife. Patient complains of burning when urinating.   OBJECTIVE Temp:  [97.6 F (36.4 C)-98.6 F (37 C)] 98.3 F (36.8 C) (08/21 1013) Pulse Rate:  [65-79] 65 (08/21 1013) Cardiac Rhythm: Normal sinus rhythm (08/21 0807) Resp:  [16-20] 16 (08/21 1013) BP: (101-125)/(60-74) 121/68 (08/21 1013) SpO2:  [94 %-97 %] 96 % (08/21 1013)  CBC:   Recent Labs Lab 08/11/16 1703  08/13/16 0241 08/14/16 0455  WBC 12.3*  < > 11.4* 11.3*  NEUTROABS 6.0  --   --   --   HGB 17.0  < > 15.8 15.8  HCT 50.7  < > 46.8 47.3  MCV 86.2  < > 85.7 85.5  PLT 198  < > 197 186  < > = values in this interval not displayed.  Basic Metabolic Panel:   Recent Labs Lab 08/13/16 0241 08/14/16 0455  NA 136 135  K 3.7 4.1  CL 103 104  CO2 23 22  GLUCOSE 216* 241*  BUN 10 10  CREATININE 0.65 0.65  CALCIUM 9.2 9.1    Lipid Panel:     Component Value Date/Time   CHOL 161 08/12/2016 0048   TRIG 332 (H) 08/12/2016 0048   HDL 24 (L) 08/12/2016 0048   CHOLHDL 6.7 08/12/2016 0048   VLDL 66 (H) 08/12/2016 0048   LDLCALC 71 08/12/2016 0048   HgbA1c:  Lab Results  Component Value Date   HGBA1C 10.6 (H) 08/12/2016   Urine Drug Screen: No results found for: LABOPIA, COCAINSCRNUR, LABBENZ, AMPHETMU, THCU, LABBARB    IMAGING No results found.   2D Echocardiogram  - Left ventricle: The cavity size was normal. Systolic function was mildly to moderately reduced. The estimated ejection fraction was in the range of 40% to 45%. There is akinesis of the inferolateral myocardium. Doppler parameters are consistent with abnormal left ventricular relaxation (grade 1 diastolic dysfunction). - Left atrium: The atrium was moderately dilated. Impressions:  No cardiac  source of emboli was indentified.  TEE pending    PHYSICAL EXAM General: WD obese Caucasian [name] lying on ED gurney, no acute distress. AAO x4. Speech clear, no dysarthria. No aphasia. Follows commands briskly. Affect is bright with congruent mood. Comportment is normal.  HEENT: Normocephalic. Neck supple without LAD. MMM, OP clear. Dentures in place. Sclerae anicteric. Mild conjunctival injection.  CV: Regular, distant, no obvious murmur. Carotid pulses full and symmetric, no bruits. Distal pulses 2+ and symmetric.  Lungs: CTAB on anterior exam.  Abdomen: Soft, obese, non-distended, non-tender. Bowel sounds present x4.  Extremities: No C/C/E. Skin over both lower legs is shiny and hyperpigmented with dry flaky skin over both feet. Neuro:  CN: Pupils are mildly unequal with the R pupil 2-->1 mm and the L pupil 3-->2 mm. Visual fields are intact to confrontation. EOMI notable for breakup of smooth pursuits in all directions. He has some hypermetric saccades but no nystagmus. No reported diplopia. Facial sensation is intact to light touch. Face is symmetric at rest with normal strength and mobility. Hearing is intact to conversational voice. Palate elevates symmetrically and uvula is midline. Voice is normal in tone, pitch and quality. Bilateral SCM and trapezii are 5/5. Tongue is midline with normal bulk and mobility.  Motor: Normal bulk, tone,  and strength throughout with the exception of 4+/5 strength in the R triceps, wrist extensors, finger extensors and grip. No tremor or other abnormal movements. No drift.  Sensation: Intact to light touch and pinprick. Vibration is diminished in BLE to above the knee, worse on the right than the left. Proprioception in both great toes is normal.  DTRs: 2+, symmetric with absent ankle jerks bilaterally. Toes downgoing bilaterally. No pathologic reflexes.  Coordination: Finger-to-nose and heel-to-shin are without dysmetria. When asked to touch his nose with eyes  closed, he misses with the right hand. Finger taps are normal in amplitude and speed, no decrement.    ASSESSMENT/PLAN [name] is a 65 y.o. male with history of coronary artery disease with previous MI as well as coronary artery bypass graft surgery, tobacco history, dyslipidemia, hypertension, hyperglycemia, and chronic venous insufficiency,  presenting with transient vision loss right eye.  He did not receive IV t-PA due to late presentation.  Stroke:  Non-dominant - Small cortical infarct at the high RIGHT frontal region - probably emboli - unknown source.  Resultant transient visual loss  MRI  - At least 3 areas of R cortical infarction (posterior frontal cortex and occipital cortex, R distal optic nerve) old posterior R frontal region.  MRA - unremarkable   Carotid Doppler No significant stenosis   2D Echo -EF 45-50%. No source of embolus   TEE today at 12 noon  Loop placement if TEE neg   LDL - 71; however, very high TGs  HgbA1c - 10.6  VTE prophylaxis -  subcutaneous heparin Diet heart healthy/carb modified Room service appropriate? Yes; Fluid consistency: Thin  aspirin 325 mg daily prior to admission, now on aspirin 81 mg daily  Patient counseled to be compliant with his antithrombotic medications  Ongoing aggressive stroke risk factor management  Therapy recommendations:  No therapy needs  Disposition:  Return home  [facility] for discharge once TEE done and loop placed if needed from the stroke standpoint  Follow up with Dr. Leonie [name] in 2 months. Order placed,  Hypertension  Stable  Permissive hypertension (OK if < 220/120) but gradually normalize in 5-7 days  Long-term BP goal normotensive  Hyperlipidemia  Home meds: No lipid lowering medications prior to admission.  LDL 71, goal < 70  Elevated TG   Now on lipitor 40 mg daily  Continue statin at discharge  Diabetes  HgbA1c 10.4, goal < 7.0  Uncontrolled ( steroid dose pack PTA )  Other  Stroke Risk Factors  Advanced age  Cigarette smoker - advised to stop smoking  Occasional ETOH use, advised to drink no more than 1 drink a day  Obesity, Body mass index is 31.24 kg/m., recommend weight loss, diet and exercise as appropriate   Coronary artery disease  Other Active Problems  Mild leukocytosis - 12.6  ( steroid dose pack PTA)  Pain with urination. Will check UA  Hospital day # [place] for Pager information 08/14/2016 11:28 AM  I have personally examined this patient, reviewed notes, independently viewed imaging studies, participated in medical decision making and plan of care. I have made any additions or clarifications directly to the above note. Agree with note above.  Patient has an embolic stroke of undetermined source. Continue present management. Outpatient follow-up. Consider possible participation in the PREMIERS stroke prevention study or RESPECT ESUS trial if interested . Greater than 50% of time during this 25 minute visit was spent on counseling  and coordination of care about stroke risk, prevention and treatment Antony Contras, MD Medical Director [place] Pager: [phone] 08/14/2016 4:12 PM  To contact Stroke Continuity provider, please refer to [email or web address] After hours, contact General Neurology

## 2016-08-14 NOTE — CV Procedure (Signed)
TEE: During this procedure the patient is administered a total of Versed 4 mg and Fentanyl 100 mg to achieve and maintain moderate conscious sedation.  The patient's heart rate, blood pressure, and oxygen saturation are monitored continuously during the procedure. The period of conscious sedation is 33 minutes, of which I was present face-to-face 100% of this time.  See full note in [employer] No SOE Bubble study late positive more consistent with pulmonary shunt Severe lipomatous hypertrophy of atrial septum Mild MR EF 45-50% inferior wall hypokinesis  Jenkins Rouge

## 2016-08-14 NOTE — Progress Notes (Addendum)
Inpatient Diabetes Program Recommendations  AACE/ADA: New Consensus Statement on Inpatient Glycemic Control (2015)  Target Ranges:  Prepandial:   less than 140 mg/dL      Peak postprandial:   less than 180 mg/dL (1-2 hours)      Critically ill patients:  140 - 180 mg/dL   Results for [name], [name] (MRN [id number]) as of 08/14/2016 15:20  Ref. Range 08/13/2016 05:52 08/13/2016 11:32 08/13/2016 16:23 08/13/2016 21:16  Glucose-Capillary Latest Ref Range: 65 - 99 mg/dL 238 (H) 295 (H) 271 (H) 323 (H)    Admit with: R Vision Loss/ CVA  Current Insulin Orders: Novolog Sensitive Correction Scale/ SSI (0-9 units) TID AC + HS     -New diagnosis of DM made this admission.  A1c was 10.6%.  Note MD plans to send pt home on Metformin bid.  -Spoke with pt about new diagnosis.  Discussed A1C results with patient and wife and explained what an A1C is, basic pathophysiology of DM Type 2, basic home care, basic diabetes diet nutrition principles, importance of checking CBGs and maintaining good CBG control to prevent long-term and short-term complications.  Also reviewed blood sugar goals at home.    -Reviewed the medication Metformin with pt and wife: How to take, when to take, side effects, etc.  -Also discussed DM diet information with patient.  Encouraged patient to avoid beverages with sugar (regular soda, sweet tea, lemonade, fruit juice) and to consume mostly water.  Discussed what foods contain carbohydrates and how carbohydrates affect the body's blood sugar levels.    -RNs to provide ongoing basic DM education at bedside with this patient.  Have ordered educational booklet and DM videos.  Have also placed RD consult for DM diet education for this patient.  -Patient and wife agreeable to Outpatient DM Education classes but do not want to attend until October. Will place this referral and ask the outpatient center to defer calling for appt until October 1st.        MD- Please make sure to give  pt a Rx for home blood glucose monitor.  Have instructed pt to check his CBGs at least bid (fasting and vary the second check of the day either before a meal or two hour after a meal) and to record all CG for his new PCP Dr. Darron Doom in [place].  Can use Order # [id number]     --Will follow patient during hospitalization--  Wyn Quaker RN, MSN, CDE Diabetes Coordinator Inpatient Glycemic Control Team Team Pager: [phone] (8a-5p)

## 2016-08-14 NOTE — Consult Note (Signed)
ELECTROPHYSIOLOGY CONSULT NOTE  Patient ID: [name] MRN: [id number], DOB/AGE: 08/01/1951   Admit date: 08/11/2016 Date of Consult: 08/14/2016  Primary Physician: No PCP Per Patient Primary Cardiologist: Cro[place] Reason for Consultation: Cryptogenic stroke; recommendations regarding Implantable Loop Recorder  History of Present Illness [name] was admitted on 08/11/2016 with abrupt loss of vision that recurred.  Imaging demonstrated non dominant small cortical infarct in the high right frontal region.  He has undergone workup for stroke including echocardiogram and carotid dopplers.  The patient has been monitored on telemetry which has demonstrated sinus rhythm with no arrhythmias.  TEE this morning demonstrated no cause for stroke.   Echocardiogram this admission demonstrated EF 45-50%, mild MR, LA 52.  Lab work is reviewed.  Prior to admission, the patient denies chest pain, shortness of breath, dizziness, palpitations, or syncope.  They are recovering from their stroke with plans to return home at discharge.  EP has been asked to evaluate for placement of an implantable loop recorder to monitor for atrial fibrillation.   Past Medical History:  Diagnosis Date  . Chronic venous insufficiency LOWER EXTREMITIES  . Coronary artery disease CARDIOLOGIST - DR  [id number]  LAST 1 WK AGO -- WILL REQUEST NOTE AND STRESS TEST  . Hyperglycemia   . Hyperlipidemia   . Hypertension   . Mixed dyslipidemia   . Myocardial infarction ([place])   . Nocturia   . Prostate cancer ([place]) 08/18/11   gleason 8, volume 24.4cc  . S/P CABG x 4   . ST elevation MI (STEMI) ([place]) 02-10-2008   S/P CABG  . Urinary hesitancy      Surgical History:  Past Surgical History:  Procedure Laterality Date  . CARDIAC CATHETERIZATION  05/01/2012   grafts widely patent  . CARDIOVASCULAR STRESS TEST  03-15-2010   INFERIOR WALL SCAR WITHOUT ANY MEANINGFUL ISCHEMIA/ EF 46% / LOW RISK SCAN  . CORONARY ARTERY  BYPASS GRAFT  02-10-2008  DR Einar Gip   X4 VESSEL DISEASE / [facility] CABG  . CYSTOSCOPY  02/08/2012   Procedure: CYSTOSCOPY FLEXIBLE;  Surgeon: Franchot Gallo, MD;  Location: [facility];  Service: Urology;;  . KNEE SURGERY  age 70   RIGHT  . LEFT HEART CATHETERIZATION WITH CORONARY/GRAFT ANGIOGRAM N/A 05/01/2012   Procedure: LEFT HEART CATHETERIZATION WITH Beatrix Fetters;  Surgeon: Sanda Klein, MD;  Location: [place] CATH LAB;  Service: Cardiovascular;  Laterality: N/A;  . MULTIPLE TEETH EXTRACTIONS (23)/ FOUR QUADRANT ALVEOLPLASTY/ MANDIBULAR LATERAL EXOSTOSES REDUCTIONS  03-24-2010   CHRONIC PERIODONTITIS  . RADIOACTIVE SEED IMPLANT  02/08/2012   Procedure: RADIOACTIVE SEED IMPLANT;  Surgeon: Franchot Gallo, MD;  Location: [facility];  Service: Urology;  Laterality: N/A;  C-ARM   . SHOULDER SURGERY  1992   LEFT     Prescriptions Prior to Admission  Medication Sig Dispense Refill Last Dose  . aspirin EC 325 MG tablet Take 325 mg by mouth daily.   08/11/2016 at 1600  . calcium carbonate (TUMS - DOSED IN MG ELEMENTAL CALCIUM) 500 MG chewable tablet Chew 1 tablet by mouth 2 (two) times daily as needed for indigestion or heartburn.   Past Week at Unknown time  . naproxen sodium (ALEVE) 220 MG tablet Take 220-440 mg by mouth 2 (two) times daily as needed (for pain and sleep).   08/10/2016 at pm    Inpatient Medications:  . aspirin EC  81 mg Oral Daily  . atorvastatin  40 mg Oral q1800  . heparin  5,000 Units Subcutaneous Q8H  . insulin aspart  0-5 Units Subcutaneous QHS  . insulin aspart  0-9 Units Subcutaneous TID WC  . living well with diabetes book   Does not apply Once  . nicotine  21 mg Transdermal Daily    Allergies:  Allergies  Allergen Reactions  . Bee Venom Anaphylaxis  . Oysters [Shellfish Allergy] Swelling    Swelling of hands  . Penicillins Other (See Comments)    CAUSES "FREE BLEEDING" Has patient had a PCN reaction causing  immediate rash, facial/tongue/throat swelling, SOB or lightheadedness with hypotension: No Has patient had a PCN reaction causing severe rash involving mucus membranes or skin necrosis: No Has patient had a PCN reaction that required hospitalization No Has patient had a PCN reaction occurring within the last 10 years: No If all of the above answers are "NO", then may proceed with Cephalosporin use.    Social History   Social History  . Marital status: Married    Spouse name: N/A  . Number of children: N/A  . Years of education: N/A   Occupational History  . Not on file.   Social History Main Topics  . Smoking status: Current Every Day Smoker    Packs/day: 1.00    Years: 50.00    Types: Cigarettes  . Smokeless tobacco: Never Used     Comment: PREVIOUSLY SMOKED 3PPD / DECREASED TO 1PPD SINCE 2009  . Alcohol use Yes     Comment: occassional  . Drug use: No  . Sexual activity: Not on file   Other Topics Concern  . Not on file   Social History Narrative  . No narrative on file     Family History  Problem Relation Age of Onset  . Cancer Brother     pancreatic      Review of Systems: All other systems reviewed and are otherwise negative except as noted above.  Physical Exam: Vitals:   08/14/16 1241 08/14/16 1250 08/14/16 1255 08/14/16 1405  BP: 115/76 106/70 110/75 118/72  Pulse: 66 66 64 68  Resp: 18 16 18 20   Temp:      TempSrc:    Oral  SpO2: 93% 95% 94% 97%  Weight:      Height:        GEN- The patient is well appearing, alert and oriented x 3 today.   Head- normocephalic, atraumatic Eyes-  Sclera clear, conjunctiva pink Ears- hearing intact Oropharynx- clear Neck- supple Lungs- Clear to ausculation bilaterally, normal work of breathing Heart- Regular rate and rhythm  GI- soft, NT, ND, + BS Extremities- no clubbing, cyanosis, or edema MS- no significant deformity or atrophy Skin- no rash or lesion Psych- euthymic mood, full affect   Labs:     Lab Results  Component Value Date   WBC 11.3 (H) 08/14/2016   HGB 15.8 08/14/2016   HCT 47.3 08/14/2016   MCV 85.5 08/14/2016   PLT 186 08/14/2016    Recent Labs Lab 08/11/16 1703  08/14/16 0455  NA 135  < > 135  K 4.3  < > 4.1  CL 100*  < > 104  CO2 27  < > 22  BUN 9  < > 10  CREATININE 0.71  < > 0.65  CALCIUM 9.5  < > 9.1  PROT 7.6  --   --   BILITOT 0.5  --   --   ALKPHOS 107  --   --   ALT 29  --   --  AST 20  --   --   GLUCOSE 243*  < > 241*  < > = values in this interval not displayed.   Radiology/Studies: Ct Head Wo Contrast Result Date: 08/11/2016 CLINICAL DATA:  Partial loss of vision in RIGHT eye question stroke, history hypertension, coronary disease post MI and CABG, prostate cancer EXAM: CT HEAD WITHOUT CONTRAST TECHNIQUE: Contiguous axial images were obtained from the base of the skull through the vertex without intravenous contrast. COMPARISON:  None FINDINGS: Minimal atrophy. Normal ventricular morphology. No midline shift or mass effect. Small cortical infarct at the high RIGHT frontal region. The lack of mass effect/edema favors this being subacute to old in age. No intracranial hemorrhage, mass lesion, extra-axial fluid collection, or additional infarct. Atherosclerotic calcifications at the carotid siphons. Orbital soft tissue planes clear. Bones and sinuses unremarkable. IMPRESSION: Small cortical infarct at the high RIGHT frontal region, favor subacute to old in age due the lack of edema/mass effect. No other intracranial abnormalities identified. Electronically Signed   By: Lavonia Dana M.D.   On: 08/11/2016 17:43   Mr Brain Wo Contrast Result Date: 08/12/2016 CLINICAL DATA:  Acute RIGHT monocular visual loss beginning 8/15. Some improvement over the past several days. History of hypertension, diabetes, and tobacco abuse. EXAM: MRI HEAD WITHOUT CONTRAST MRA HEAD WITHOUT CONTRAST TECHNIQUE: Multiplanar, multiecho pulse sequences of the brain and surrounding  structures were obtained without intravenous contrast. Angiographic images of the head were obtained using MRA technique without contrast. COMPARISON:  CT head 08/11/2016 FINDINGS: The patient was unable to remain motionless for the exam. Small or subtle lesions could be overlooked. MRI HEAD FINDINGS At least 3 areas of acute infarction, sub cm size, affect the RIGHT lateral occipital cortex, RIGHT medial occipital cortex, and RIGHT posterior frontal cortex is documented on axial and coronal diffusion weighted imaging. These likely represent a shower of emboli. No LEFT-sided or posterior fossa areas of acute infarction are seen. In the RIGHT orbit, there is asymmetric restricted diffusion involving the distal optic nerve on the RIGHT. See axial image 20 series 5 and coronal image 26 series 9. Acute RIGHT optic nerve infarction is not excluded. No hemorrhage, mass lesion, hydrocephalus, or extra-axial fluid. Premature for age atrophy. Mild subcortical and periventricular T2 and FLAIR hyperintensities, likely chronic microvascular ischemic change. Chronic RIGHT posterior frontal cortical and subcortical infarction with gliosis, chronic blood products, and encephalomalacia. Remote LEFT pontine lacunar infarct. Flow voids are maintained. No other foci of chronic hemorrhage. No midline abnormality. Extracranial soft tissues unremarkable. MRA HEAD FINDINGS The internal carotid arteries are widely patent. The basilar artery is widely patent with both vertebrals contributing. There is no proximal stenosis of the anterior, middle, or posterior cerebral arteries. No cerebellar branch occlusion. Insufficient visualization of the ophthalmic arteries bilaterally, to confirm or exclude patency. No intracranial saccular aneurysm is evident. IMPRESSION: At least 3 areas of acute, nonhemorrhagic cortical infarction, affecting the RIGHT hemisphere in the posterior frontal cortex and occipital cortex consistent with a shower of  emboli. Asymmetric restricted diffusion involving the distal optic nerve on the RIGHT, suggesting acute infarction, correlating with anterior ischemic optic neuropathy. Premature atrophy with mild chronic microvascular ischemic change. Remote cortical infarct, RIGHT posterior frontal region. No intracranial flow reducing lesion on MRI  is evident. Electronically Signed   By: Staci Righter M.D.   On: 08/12/2016 11:50   12-lead ECG sinus rhythm, LVH, rate 67 All prior EKG's in EPIC reviewed with no documented atrial fibrillation  Telemetry sinus rhythm  Assessment and Plan:  1. Cryptogenic stroke The patient presents with cryptogenic stroke.  TEE done this morning.  I spoke at length with the patient about monitoring for afib with an implantable loop recorder.  Risks, benefits, and alteratives to implantable loop recorder were discussed with the patient today.   At this time, the patient is very clear in their decision to proceed with implantable loop recorder.   Wound care was reviewed with the patient (keep incision clean and dry for 3 days).  Wound check scheduled for 08/24/16 at 12noon.  2.  CAD No recent cardiology follow up Will arrange office visit with Dr C to re-establish   3.  Tobacco abuse Cessation advised   Please call with questions.   Chanetta Marshall, NP 08/14/2016 3:15 PM  EP Attending  Patient seen and examined. Agree with above exam assessment and plan. Will proceed with ILR for cryptogenic stroke.  Mikle Bosworth.D.

## 2016-08-14 NOTE — Discharge Summary (Signed)
[facility] Discharge Summary  Patient name: [name] Medical record number: [id number] Date of birth: 1951-09-12 Age: 65 y.o. Gender: male Date of Admission: 08/11/2016  Date of Discharge: 08/14/16 Admitting Physician: Alveda Reasons, MD  Primary Care Provider: No PCP Per Patient Consultants: neurology  Indication for Hospitalization: Right sided vision loss  Discharge Diagnoses/Problem List:  Right frontal cortical infarct, probably embolic, unknown source CAD Tobacco abuse DM II  Disposition: home  Discharge Condition: stable  Discharge Exam:  General: NAD, obese male. Cardiovascular: RRR, no m/r/g Respiratory: CTAB, no wheezes or crackles Abdomen: SNTND Extremities: bilateral chronic appearing venous stasis changes, no wounds Neuro: CN II-XII grossly intact  Brief Hospital Course:  Pt had an episode of 30 min of R sided vision loss 2 days prior to admission. This resolved spontaneously, but vision loss recurred the next afternoon. He went to an eye doctor on Thursday, who sent him to a retinal specialist ([facility]). The retinal specialist thought he was having a stroke and told him to take Aspirin 325m and go to the ED. In the ED, labs were significant for a WBC count of 12.3 and elevated glucose to 243. EKG showed LVH but no new ST or T wave changes. CT head showed small cortical infarct in the right frontal region, favoring subacute to old in age. Pt was admitted for stroke work-up. Pt has multiple risk factors for stroke including HTN, HLD, tobacco abuse, obesity. ESR not elevated at 6, CRP mildly elevated at 1.1. Total cholesterol 161, Triglycerides elevated at 332, HDL low, LDL WNL. TSH normal. MRA At least 3 areas of acute, nonhemorrhagic cortical infarction, affecting the RIGHT hemisphere in the posterior frontal cortex and occipital cortex consistent with a shower of emboli. Carotid [place]- 1-39% ICA plaquing  prelim read. Trop negx1. Also of note, pt endorses bilateral burning lower extremity pain for several years. HgbA1c was 10.6. Started metformin on discharge.  TTE did not show a source of emboli, moderately reduced systolic function EF 432-54% akinesis of the inferolateral myocardium, grade 1 diastolic dysfunction. TEE to look for source of emboli showed no evidence of thrombus, no obvious PFO, late bubble study positive (suggestive of possible pulmonary shunt). After discharge, we discussed late bubble study positive with Dr. SLeonie Man- these are not often clinically significant. Was going to call the patient to schedule lower extremity dopplers, but pt was already back in the emergency room. Asked the ED to order these today.   Issues for Follow Up:  1. Follow up lower extremity dopplers as a possible source of clot.  2. New diagnosis of diabetes - needs education, and could possibly need insulin (HgbA1C 10.6). Insulin was deferred secondary to starting so many new medicines at one time and concern for compliance. Consider adding gabapentin, pt understands that this could help his LE neuropathic pain, but was not inclined to start another new medicine at discharge. 3. Could consider an ace for BP if HTN reemerges at an outpatient, was normotensive on discharge. 4. Needs outpatient neurology follow up. 5. Cards follow up for removal of loop recorder - 09/21/16.  Significant Procedures: TEE as above.   Significant Labs and Imaging:   Recent Labs Lab 08/13/16 0241 08/14/16 0455 08/15/16 1314 08/15/16 1334  WBC 11.4* 11.3* 10.0  --   HGB 15.8 15.8 17.8* 17.7*  HCT 46.8 47.3 51.0 52.0  PLT 197 186 203  --     Recent Labs Lab 08/11/16  1703 08/12/16 0048 08/13/16 0241 08/14/16 0455 08/15/16 1314 08/15/16 1334  NA 135  --  136 135 135 139  K 4.3  --  3.7 4.1 3.9 3.9  CL 100*  --  103 104 104 101  CO2 27  --  _0 --   GLUCOSE 243*  --  216* 241* 253* 254*  BUN 9  --  _1 CREATININE 0.71 0.68 0.65 0.65 0.72 0.60*  CALCIUM 9.5  --  9.2 9.1 9.6  --   ALKPHOS 107  --   --   --  97  --   AST 20  --   --   --  27  --   ALT 29  --   --   --  36  --   ALBUMIN 4.1  --   --   --  4.1  --    Results/Tests Pending at Time of Discharge: none  Discharge Medications:    Medication List    TAKE these medications   ALEVE 220 MG tablet Generic drug:  naproxen sodium Take 220-440 mg by mouth 2 (two) times daily as needed (for pain and sleep).   aspirin 81 MG EC tablet Take 1 tablet (81 mg total) by mouth daily. What changed:  medication strength  how much to take   atorvastatin 40 MG tablet Commonly known as:  LIPITOR Take 1 tablet (40 mg total) by mouth daily at 6 PM.   calcium carbonate 500 MG chewable tablet Commonly known as:  TUMS - dosed in mg elemental calcium Chew 1 tablet by mouth 2 (two) times daily as needed for indigestion or heartburn.   metFORMIN 500 MG tablet Commonly known as:  GLUCOPHAGE Take 1 tablet (500 mg total) by mouth 2 (two) times daily with a meal.       Discharge Instructions: Please refer to Patient Instructions section of EMR for full details.  Patient was counseled important signs and symptoms that should prompt return to medical care, changes in medications, dietary instructions, activity restrictions, and follow up appointments.   Follow-Up Appointments: Follow-up Information    Hayden Rasmussen., MD. Daphane Shepherd on 08/25/2016.   Specialty:  Family Medicine Why:  Appt at 1pm, arrive 15 min early. Please bring photo ID and insurance card. Contact information: [place] [address] [phone]        SETHI,PRAMOD, MD Follow up in 2 month(s).   Specialties:  Neurology, Radiology Why:  stroke clinic. office will call you with appt date and time Contact information: [place] [address] [phone]        [place] [facility] Follow up on 08/24/2016.    Specialty:  Cardiology Why:  at 12noon for wound check  Contact information: [address] [place] [phone]       Sanda Klein, MD Follow up on 09/21/2016.   Specialty:  Cardiology Why:  at 8:15AM Contact information: [address] [place] [address] [phone]           Sela Hilding, MD 08/15/2016, 2:53 PM PGY-1, [place]

## 2016-08-14 NOTE — Discharge Instructions (Signed)
You were admitted to the hospital due to vision loss and was found to have a stroke. Neurology saw you while you were here. You were started on medications to help prevent the risk of another stroke: this includes aspirin, Lipitor (cholesterol medication). Your ultrasound of your heart did not show any signs of clots that are contributing to the stroke you had. You had a loop recorder placed during this hospitalization; this is to determine if you have any abnormal heart rhythms that may have contributed to the stroke you had. You were newly diagnosed with diabetes; you were started on Metformin for this. You have follow up appointments; please keep these appointments

## 2016-08-14 NOTE — Interval H&P Note (Signed)
History and Physical Interval Note:  08/14/2016 10:51 AM  [name]  has presented today for surgery, with the diagnosis of stroke  The various methods of treatment have been discussed with the patient and family. After consideration of risks, benefits and other options for treatment, the patient has consented to  Procedure(s): TRANSESOPHAGEAL ECHOCARDIOGRAM (TEE) (N/A) as a surgical intervention .  The patient's history has been reviewed, patient examined, no change in status, stable for surgery.  I have reviewed the patient's chart and labs.  Questions were answered to the patient's satisfaction.     Jenkins Rouge

## 2016-08-15 ENCOUNTER — Emergency Department (HOSPITAL_COMMUNITY): Payer: BLUE CROSS/BLUE SHIELD

## 2016-08-15 ENCOUNTER — Telehealth (HOSPITAL_COMMUNITY): Payer: Self-pay | Admitting: Family Medicine

## 2016-08-15 ENCOUNTER — Telehealth: Payer: Self-pay

## 2016-08-15 ENCOUNTER — Emergency Department (HOSPITAL_COMMUNITY)
Admission: EM | Admit: 2016-08-15 | Discharge: 2016-08-15 | Disposition: A | Discharge status: Home / Self Care | Payer: BLUE CROSS/BLUE SHIELD | Attending: Emergency Medicine | Admitting: Emergency Medicine

## 2016-08-15 ENCOUNTER — Other Ambulatory Visit: Payer: Self-pay | Admitting: *Deleted

## 2016-08-15 ENCOUNTER — Encounter (HOSPITAL_COMMUNITY): Payer: Self-pay | Admitting: Internal Medicine

## 2016-08-15 ENCOUNTER — Telehealth: Payer: Self-pay | Admitting: Neurology

## 2016-08-15 DIAGNOSIS — I639 Cerebral infarction, unspecified: Secondary | ICD-10-CM | POA: Diagnosis not present

## 2016-08-15 DIAGNOSIS — Z79899 Other long term (current) drug therapy: Secondary | ICD-10-CM | POA: Insufficient documentation

## 2016-08-15 DIAGNOSIS — E119 Type 2 diabetes mellitus without complications: Secondary | ICD-10-CM | POA: Insufficient documentation

## 2016-08-15 DIAGNOSIS — H578 Other specified disorders of eye and adnexa: Secondary | ICD-10-CM | POA: Diagnosis present

## 2016-08-15 DIAGNOSIS — F17210 Nicotine dependence, cigarettes, uncomplicated: Secondary | ICD-10-CM | POA: Diagnosis not present

## 2016-08-15 DIAGNOSIS — Z8546 Personal history of malignant neoplasm of prostate: Secondary | ICD-10-CM | POA: Diagnosis not present

## 2016-08-15 DIAGNOSIS — Z8673 Personal history of transient ischemic attack (TIA), and cerebral infarction without residual deficits: Secondary | ICD-10-CM | POA: Insufficient documentation

## 2016-08-15 DIAGNOSIS — Z7984 Long term (current) use of oral hypoglycemic drugs: Secondary | ICD-10-CM | POA: Insufficient documentation

## 2016-08-15 DIAGNOSIS — I6310 Cerebral infarction due to embolism of unspecified precerebral artery: Secondary | ICD-10-CM | POA: Diagnosis not present

## 2016-08-15 DIAGNOSIS — I252 Old myocardial infarction: Secondary | ICD-10-CM | POA: Insufficient documentation

## 2016-08-15 DIAGNOSIS — I2510 Atherosclerotic heart disease of native coronary artery without angina pectoris: Secondary | ICD-10-CM | POA: Diagnosis not present

## 2016-08-15 DIAGNOSIS — Z7982 Long term (current) use of aspirin: Secondary | ICD-10-CM | POA: Diagnosis not present

## 2016-08-15 DIAGNOSIS — Z951 Presence of aortocoronary bypass graft: Secondary | ICD-10-CM | POA: Insufficient documentation

## 2016-08-15 DIAGNOSIS — I6523 Occlusion and stenosis of bilateral carotid arteries: Secondary | ICD-10-CM

## 2016-08-15 LAB — CBG MONITORING, ED: Glucose-Capillary: 220 mg/dL — ABNORMAL HIGH (ref 65–99)

## 2016-08-15 LAB — I-STAT TROPONIN, ED: TROPONIN I, POC: 0.01 ng/mL (ref 0.00–0.08)

## 2016-08-15 LAB — URINALYSIS, ROUTINE W REFLEX MICROSCOPIC
Bilirubin Urine: NEGATIVE
Glucose, UA: >1000 mg/dL — AB
Hgb urine dipstick: NEGATIVE
KETONES UR: NEGATIVE mg/dL
LEUKOCYTES UA: NEGATIVE
NITRITE: NEGATIVE
PROTEIN: 30 mg/dL — AB
Specific Gravity, Urine: 1.03 (ref 1.005–1.030)
pH: 5 (ref 5.0–8.0)

## 2016-08-15 LAB — COMPREHENSIVE METABOLIC PANEL
ALT: 36 U/L (ref 17–63)
AST: 27 U/L (ref 15–41)
Albumin: 4.1 g/dL (ref 3.5–5.0)
Alkaline Phosphatase: 97 U/L (ref 38–126)
Anion gap: 7 (ref 5–15)
BUN: 9 mg/dL (ref 6–20)
CHLORIDE: 104 mmol/L (ref 101–111)
CO2: 24 mmol/L (ref 22–32)
Calcium: 9.6 mg/dL (ref 8.9–10.3)
Creatinine, Ser: 0.72 mg/dL (ref 0.61–1.24)
Glucose, Bld: 253 mg/dL — ABNORMAL HIGH (ref 65–99)
POTASSIUM: 3.9 mmol/L (ref 3.5–5.1)
SODIUM: 135 mmol/L (ref 135–145)
Total Bilirubin: 1.1 mg/dL (ref 0.3–1.2)
Total Protein: 7.7 g/dL (ref 6.5–8.1)

## 2016-08-15 LAB — URINE MICROSCOPIC-ADD ON
RBC / HPF: NONE SEEN RBC/hpf (ref 0–5)
SQUAMOUS EPITHELIAL / LPF: NONE SEEN
WBC UA: NONE SEEN WBC/hpf (ref 0–5)

## 2016-08-15 LAB — DIFFERENTIAL
BASOS ABS: 0.1 10*3/uL (ref 0.0–0.1)
BASOS PCT: 1 %
EOS ABS: 0.2 10*3/uL (ref 0.0–0.7)
Eosinophils Relative: 2 %
Lymphocytes Relative: 42 %
Lymphs Abs: 4.2 10*3/uL — ABNORMAL HIGH (ref 0.7–4.0)
MONOS PCT: 5 %
Monocytes Absolute: 0.5 10*3/uL (ref 0.1–1.0)
NEUTROS ABS: 5 10*3/uL (ref 1.7–7.7)
Neutrophils Relative %: 50 %

## 2016-08-15 LAB — PROTIME-INR
INR: 1.02
PROTHROMBIN TIME: 13.4 s (ref 11.4–15.2)

## 2016-08-15 LAB — I-STAT CHEM 8, ED
BUN: 10 mg/dL (ref 6–20)
Calcium, Ion: 1.15 mmol/L (ref 1.12–1.23)
Chloride: 101 mmol/L (ref 101–111)
Creatinine, Ser: 0.6 mg/dL — ABNORMAL LOW (ref 0.61–1.24)
Glucose, Bld: 254 mg/dL — ABNORMAL HIGH (ref 65–99)
HEMATOCRIT: 52 % (ref 39.0–52.0)
HEMOGLOBIN: 17.7 g/dL — AB (ref 13.0–17.0)
POTASSIUM: 3.9 mmol/L (ref 3.5–5.1)
SODIUM: 139 mmol/L (ref 135–145)
TCO2: 25 mmol/L (ref 0–100)

## 2016-08-15 LAB — CBC
HEMATOCRIT: 51 % (ref 39.0–52.0)
Hemoglobin: 17.8 g/dL — ABNORMAL HIGH (ref 13.0–17.0)
MCH: 29.9 pg (ref 26.0–34.0)
MCHC: 34.9 g/dL (ref 30.0–36.0)
MCV: 85.6 fL (ref 78.0–100.0)
PLATELETS: 203 10*3/uL (ref 150–400)
RBC: 5.96 MIL/uL — ABNORMAL HIGH (ref 4.22–5.81)
RDW: 13.5 % (ref 11.5–15.5)
WBC: 10 10*3/uL (ref 4.0–10.5)

## 2016-08-15 LAB — APTT: APTT: 29 s (ref 24–36)

## 2016-08-15 NOTE — ED Notes (Signed)
Neurologist at bedside. 

## 2016-08-15 NOTE — ED Triage Notes (Signed)
Smile symmetric, hand grips equal.

## 2016-08-15 NOTE — ED Notes (Signed)
CGB: 220 RN notified

## 2016-08-15 NOTE — ED Triage Notes (Signed)
Pt left hospital yesterday for partial loss of vision in right eye.  Eye had improved when left hospital, woke up this morning @ 6:30am with partial vision loss again, everything looks "hazy, blurry and lights appear pink".

## 2016-08-15 NOTE — Telephone Encounter (Signed)
Called and spoke with neurology attending Dr. Leonie Man earlier today regarding TEE findings of no cardiac source of emboli, but possible pulmonary shunt (due to late bubble study positive).  Dr. Leonie Man advised this is likely not a source of embolus to the brain, that usually these soft findings are not clinically significant. Advised we can pursue outpatient lower extremity dopplers to rule out DVT as a potential source of embolus, but this did not necessarily need to occur inpatient.  I was planning to call patient and arrange for outpatient lower extremity dopplers, when I noticed that patient has apparently returned to the ED with worsening vision issues in one eye. I have called the ED charge nurse to update them on his history and ask that they obtain lower extremity dopplers as a part of their evaluation. FPTS inpatient team also updated. We will be happy to readmit [name] should it be deemed necessary.  Leeanne Rio, MD

## 2016-08-15 NOTE — Telephone Encounter (Signed)
Pt called said he is not able to see out of one eye today an thought he might have had another stroke. He said Dr Leonie Man saw him in the hospital and wanted to know what he could suggest. I advised the pt to go to the ED as the clinic does not handle urgent care or trauma. He understood and said thank you.

## 2016-08-15 NOTE — ED Provider Notes (Signed)
[facility] DEPT Provider Note   CSN: [id number] Arrival date & time: 08/15/16  1301     History   Chief Complaint Chief Complaint  Patient presents with  . Eye Problem    HPI [name] is a 65 y.o. male.  HPI Pt is a 65 yo white male w/ a PMH significant for MI (s/p 4x bypass), T2DM, stroke (acute +/- chronic) who presented to the ED after waking up w/ vision changes in his right eye at 6am this morning. Pt was recently admitted with R sided vision complains and found to have acute infarcts, however his vision was back to baseline prior to being discharged. Pt describes blurry vision w/ a red/pink tinge in his right eye (when left eye covered) and periodic pain throbbing pain in the right eye (new symptom). Pt began to have right eye visual disturbance last Tuesday and he was seen by an optometrist who advised that he go to the ED. Pt was admitted on Friday until Monday evening for acute stroke (several areas of infarct) confirmed on MRI during admission.  Past Medical History:  Diagnosis Date  . Chronic venous insufficiency LOWER EXTREMITIES  . Coronary artery disease CARDIOLOGIST - DR  [id number]  LAST 1 WK AGO -- WILL REQUEST NOTE AND STRESS TEST  . Hyperglycemia   . Hyperlipidemia   . Hypertension   . Mixed dyslipidemia   . Myocardial infarction ([place])   . Nocturia   . Prostate cancer ([place]) 08/18/11   gleason 8, volume 24.4cc  . S/P CABG x 4   . ST elevation MI (STEMI) ([place]) 02-10-2008   S/P CABG  . Urinary hesitancy     Patient Active Problem List   Diagnosis Date Noted  . Stroke ([place]) 08/12/2016  . Hyperglycemia   . Essential hypertension   . Vision, loss, sudden   . CVA (cerebral infarction) 08/11/2016  . CAD - inferior MI 2009, CABG 2009, patent grafts cath 04/2012 09/23/2013  . Obesity 09/23/2013  . Hyperlipidemia 09/23/2013  . Tobacco abuse 09/23/2013  . Prostate cancer ([place]) 08/18/2011  . Myocardial infarction [facility])     Past Surgical History:    Procedure Laterality Date  . CARDIAC CATHETERIZATION  05/01/2012   grafts widely patent  . CARDIOVASCULAR STRESS TEST  03-15-2010   INFERIOR WALL SCAR WITHOUT ANY MEANINGFUL ISCHEMIA/ EF 46% / LOW RISK SCAN  . CORONARY ARTERY BYPASS GRAFT  02-10-2008  DR Einar Gip   X4 VESSEL DISEASE / [facility] CABG  . CYSTOSCOPY  02/08/2012   Procedure: CYSTOSCOPY FLEXIBLE;  Surgeon: Franchot Gallo, MD;  Location: [facility];  Service: Urology;;  . EP IMPLANTABLE DEVICE N/A 08/14/2016   Procedure: Loop Recorder Insertion;  Surgeon: Evans Lance, MD;  Location: [facility] CV LAB;  Service: Cardiovascular;  Laterality: N/A;  . KNEE SURGERY  age 89   RIGHT  . LEFT HEART CATHETERIZATION WITH CORONARY/GRAFT ANGIOGRAM N/A 05/01/2012   Procedure: LEFT HEART CATHETERIZATION WITH Beatrix Fetters;  Surgeon: Sanda Klein, MD;  Location: [place] CATH LAB;  Service: Cardiovascular;  Laterality: N/A;  . MULTIPLE TEETH EXTRACTIONS (23)/ FOUR QUADRANT ALVEOLPLASTY/ MANDIBULAR LATERAL EXOSTOSES REDUCTIONS  03-24-2010   CHRONIC PERIODONTITIS  . RADIOACTIVE SEED IMPLANT  02/08/2012   Procedure: RADIOACTIVE SEED IMPLANT;  Surgeon: Franchot Gallo, MD;  Location: [facility];  Service: Urology;  Laterality: N/A;  C-ARM   . SHOULDER SURGERY  1992   LEFT  . TEE WITHOUT CARDIOVERSION N/A 08/14/2016   Procedure: TRANSESOPHAGEAL ECHOCARDIOGRAM (TEE);  Surgeon: Josue Hector, MD;  Location: [facility] ENDOSCOPY;  Service: Cardiovascular;  Laterality: N/A;       Home Medications    Prior to Admission medications   Medication Sig Start Date End Date Taking? Authorizing Provider  aspirin EC 81 MG EC tablet Take 1 tablet (81 mg total) by mouth daily. 08/14/16  Yes Smiley Houseman, MD  atorvastatin (LIPITOR) 40 MG tablet Take 1 tablet (40 mg total) by mouth daily at 6 PM. 08/15/16  Yes Smiley Houseman, MD  calcium carbonate (TUMS - DOSED IN MG ELEMENTAL CALCIUM) 500 MG chewable tablet Chew 1  tablet by mouth 2 (two) times daily as needed for indigestion or heartburn.   Yes Historical Provider, MD  metFORMIN (GLUCOPHAGE) 500 MG tablet Take 1 tablet (500 mg total) by mouth 2 (two) times daily with a meal. 08/14/16  Yes Smiley Houseman, MD  naproxen sodium (ALEVE) 220 MG tablet Take 220-440 mg by mouth 2 (two) times daily as needed (for pain and sleep).   Yes Historical Provider, MD  nicotine (NICODERM CQ - DOSED IN MG/24 HOURS) 21 mg/24hr patch Place 21 mg onto the skin daily.   Yes Historical Provider, MD    Family History Family History  Problem Relation Age of Onset  . Cancer Brother     pancreatic    Social History Social History  Substance Use Topics  . Smoking status: Current Every Day Smoker    Packs/day: 1.00    Years: 50.00    Types: Cigarettes  . Smokeless tobacco: Never Used     Comment: PREVIOUSLY SMOKED 3PPD / DECREASED TO 1PPD SINCE 2009  . Alcohol use Yes     Comment: occassional     Allergies   Bee venom; Oysters [shellfish allergy]; and Penicillins   Review of Systems Review of Systems  ROS 10 Systems reviewed and are negative for acute change except as noted in the HPI.     Physical Exam Updated Vital Signs BP 121/83   Pulse 80   Temp 98.2 F (36.8 C)   Resp 17   Ht 5\' 11"  (1.803 m)   SpO2 95%   Physical Exam  Constitutional: He is oriented to person, place, and time. He appears well-developed and well-nourished.  HENT:  Head: Normocephalic and atraumatic.  Eyes: EOM are normal. Pupils are equal, round, and reactive to light.  Neck: Normal range of motion. Neck supple. No JVD present.  Cardiovascular: Normal rate and regular rhythm.   Pulmonary/Chest: Effort normal and breath sounds normal. No respiratory distress. He has no wheezes.  Abdominal: Soft. Bowel sounds are normal. He exhibits no distension. There is no tenderness. There is no rebound and no guarding.  Neurological: He is alert and oriented to person, place, and  time. No cranial nerve deficit. Coordination normal.  NIHSS - 0 No objective sensory deficits, Motor strength upper and lower extremity 4+ and equal Normal cerebellar exam.  Pt has blurry vision on his R eye. Looks like peripheral vision is slightly impaired.  Skin: Skin is warm and dry.  Nursing note and vitals reviewed.    ED Treatments / Results  Labs (all labs ordered are listed, but only abnormal results are displayed) Labs Reviewed  CBC - Abnormal; Notable for the following:       Result Value   RBC 5.96 (*)    Hemoglobin 17.8 (*)    All other components within normal limits  DIFFERENTIAL - Abnormal; Notable for the following:  Lymphs Abs 4.2 (*)    All other components within normal limits  COMPREHENSIVE METABOLIC PANEL - Abnormal; Notable for the following:    Glucose, Bld 253 (*)    All other components within normal limits  URINALYSIS, ROUTINE W REFLEX MICROSCOPIC (NOT AT [facility]) - Abnormal; Notable for the following:    Glucose, UA >1000 (*)    Protein, ur 30 (*)    All other components within normal limits  URINE MICROSCOPIC-ADD ON - Abnormal; Notable for the following:    Bacteria, UA RARE (*)    All other components within normal limits  CBG MONITORING, ED - Abnormal; Notable for the following:    Glucose-Capillary 220 (*)    All other components within normal limits  I-STAT CHEM 8, ED - Abnormal; Notable for the following:    Creatinine, Ser 0.60 (*)    Glucose, Bld 254 (*)    Hemoglobin 17.7 (*)    All other components within normal limits  PROTIME-INR  APTT  I-STAT TROPOININ, ED    EKG  EKG Interpretation  Date/Time:  Tuesday August 15 2016 13:07:18 EDT Ventricular Rate:  101 PR Interval:  154 QRS Duration: 112 QT Interval:  324 QTC Calculation: 420 R Axis:   75 Text Interpretation:  Sinus tachycardia Possible Left atrial enlargement Inferior infarct , age undetermined Anterior infarct , age undetermined ST & T wave abnormality, consider  lateral ischemia Abnormal ECG no STEMI. NONSPECIFIC st DEPRESSION. Confirmed by Johnney Killian, MD, Jeannie Done [phone]) on 08/15/2016 1:12:39 PM       Radiology Ct Head Wo Contrast  Result Date: 08/15/2016 CLINICAL DATA:  Partial vision loss in the right eye. EXAM: CT HEAD WITHOUT CONTRAST TECHNIQUE: Contiguous axial images were obtained from the base of the skull through the vertex without intravenous contrast. COMPARISON:  Brain MRI 08/12/2016, head CT 08/11/2016 FINDINGS: Brain: There is a stable area of hypoattenuation in the subcortical region of the posterior right frontal lobe, corresponding to known acute ischemic infarct. The lacunar infarcts in the right occipital lobe, demonstrated by recent MRI, are not well seen by CT. No evidence of acute hemorrhage, hydrocephalus, extra-axial collection or mass lesion/ mass effect. Mild brain parenchymal volume loss. Vascular: No hyperdense vessel or unexpected calcification. Skull: No displaced fractures. Sinuses/Orbits: No acute finding. Other: None. IMPRESSION: Stable area of hypoattenuation in the subcortical posterior right frontal lobe, corresponding to known acute non hemorrhagic infarct. No evidence of intracranial hemorrhage or new areas of infarction visible by CT. Mild brain parenchymal volume loss. Electronically Signed   By: Fidela Salisbury M.D.   On: 08/15/2016 14:46    Procedures Procedures (including critical care time)  Medications Ordered in ED Medications - No data to display   Initial Impression / Assessment and Plan / ED Course  I have reviewed the triage vital signs and the nursing notes.  Pertinent labs & imaging results that were available during my care of the patient were reviewed by me and considered in my medical decision making (see chart for details).  Clinical Course  Comment By Time  Neurology has cleared the patient for discharge. Strict ER return precautions have been discussed, and patient is agreeing with the plan  and is comfortable with the workup done and the recommendations from the ER. He is to se Retina doctor sooner. Varney Biles, MD 08/22 2020   Pt comes in with cc of R sided blurry vision and intermittent throbbing pain.  Pt's acute visual disturbance of the right eye is similar  to his recent vision changes in that eye. He has known acute stoke in multiple cerebral locations and this return of symptoms may be secondary to an additional infarct or hemorrhagic conversion of previous infarct.  Acutely the pt is stable and given his return of right eye symptoms would warrant neurology re-evaluation. Neurology has already recently performed an extensive workup for the pt's symptoms including cardiac monitoring.  CT head shows no hemorrhage. Neuro consulted.  We also considered CRVO, CRAO in our differential. Pt already has a retina doctor appointment for next month, and we have asked him to see if he can get an appt sooner.  Final Clinical Impressions(s) / ED Diagnoses   Final diagnoses:  Ischemic stroke [facility])    New Prescriptions Discharge Medication List as of 08/15/2016  8:06 PM       Varney Biles, MD 08/15/16 2031

## 2016-08-15 NOTE — Telephone Encounter (Signed)
Rec'd voice message from pt's wife.  Reported that the pt. had complete vision loss in the right eye, and questioned about being seen in our office prior to appt. 8/23.  Phone call to the pt. The pt. stated that the vision in right eye was worse today compared to yesterday.  Denied any unilateral weakness, speech difficulty, facial droop, or disorientation.  Reported he spoke with the neurologist's office this morning and was told to go to the ER with current symptoms.  Pt. stated he is preparing to go to the ER now.

## 2016-08-15 NOTE — ED Triage Notes (Signed)
Pt also c/o throbbing pain in right eye upon awakening.  Pt took ASA 81 mg @ 8:30am.

## 2016-08-15 NOTE — Consult Note (Signed)
Initial Neurological Consultation                      NEURO HOSPITALIST CONSULT NOTE   Requestig physician: Dr. Kathrynn Humble   Reason for Consult:  Decreased vision in right eye.   HPI:                                                                                                                                          [name] is an 65 y.o. male who was admitted to the hospital on 08/13/2016 with a complaint of decreased vision in the right eye. He was seen and evaluated by the neurology service at that time. His MRI of the brain revealed at least 3 areas of acute nonhemorrhagic cortical infarction affecting the right hemisphere in the posterior frontal cortex and occipital cortex consistent with a shower of emboli. 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  Additional studies were performed including MRA, echocardiogram, and insertion of a loop recorder. The patient was discharged to home yesterday on aspirin and Plavix. At the time of discharge [name] noted improvement in the vision in the right eye although he was not completely back to his baseline. This morning [name] again noted decreased vision in the right eye similar to his initial admission on 08/13/2016. He also noted a sharp throbbing pain in the eye that has subsequently resolved. He described his vision is being hazy, blurry with lights appearing pink.    Past Medical History:  Diagnosis Date  . Chronic venous insufficiency LOWER EXTREMITIES  . Coronary artery disease CARDIOLOGIST - DR  [id number]  LAST 1 WK AGO -- WILL REQUEST NOTE AND STRESS TEST  . Hyperglycemia   . Hyperlipidemia   . Hypertension   . Mixed dyslipidemia   . Myocardial infarction ([place])   . Nocturia   . Prostate cancer ([place]) 08/18/11   gleason 8, volume 24.4cc  . S/P CABG x 4   . ST elevation MI (STEMI) ([place]) 02-10-2008   S/P CABG  . Urinary hesitancy     Past Surgical  History:  Procedure Laterality Date  . CARDIAC CATHETERIZATION  05/01/2012   grafts widely patent  . CARDIOVASCULAR STRESS TEST  03-15-2010   INFERIOR WALL SCAR WITHOUT ANY MEANINGFUL ISCHEMIA/ EF 46% / LOW RISK SCAN  . CORONARY ARTERY BYPASS GRAFT  02-10-2008  DR Einar Gip   X4 VESSEL DISEASE / [facility] CABG  . CYSTOSCOPY  02/08/2012   Procedure: CYSTOSCOPY FLEXIBLE;  Surgeon: Franchot Gallo, MD;  Location: [facility];  Service: Urology;;  . EP IMPLANTABLE DEVICE N/A 08/14/2016   Procedure: Loop Recorder Insertion;  Surgeon: Evans Lance, MD;  Location: [place] CV LAB;  Service: Cardiovascular;  Laterality: N/A;  . KNEE SURGERY  age 89   RIGHT  .  LEFT HEART CATHETERIZATION WITH CORONARY/GRAFT ANGIOGRAM N/A 05/01/2012   Procedure: LEFT HEART CATHETERIZATION WITH Beatrix Fetters;  Surgeon: Sanda Klein, MD;  Location: [place] CATH LAB;  Service: Cardiovascular;  Laterality: N/A;  . MULTIPLE TEETH EXTRACTIONS (23)/ FOUR QUADRANT ALVEOLPLASTY/ MANDIBULAR LATERAL EXOSTOSES REDUCTIONS  03-24-2010   CHRONIC PERIODONTITIS  . RADIOACTIVE SEED IMPLANT  02/08/2012   Procedure: RADIOACTIVE SEED IMPLANT;  Surgeon: Franchot Gallo, MD;  Location: [facility];  Service: Urology;  Laterality: N/A;  C-ARM   . SHOULDER SURGERY  1992   LEFT  . TEE WITHOUT CARDIOVERSION N/A 08/14/2016   Procedure: TRANSESOPHAGEAL ECHOCARDIOGRAM (TEE);  Surgeon: Josue Hector, MD;  Location: [facility] ENDOSCOPY;  Service: Cardiovascular;  Laterality: N/A;    MEDICATIONS:                                                                                                                     I have reviewed the patient's current medications.  Allergies  Allergen Reactions  . Bee Venom Anaphylaxis  . Oysters [Shellfish Allergy] Swelling    Swelling of hands  . Penicillins Other (See Comments)    CAUSES "FREE BLEEDING" Has patient had a PCN reaction causing immediate rash, facial/tongue/throat  swelling, SOB or lightheadedness with hypotension: No Has patient had a PCN reaction causing severe rash involving mucus membranes or skin necrosis: No Has patient had a PCN reaction that required hospitalization No Has patient had a PCN reaction occurring within the last 10 years: No If all of the above answers are "NO", then may proceed with Cephalosporin use.     Social History:  reports that he has been smoking Cigarettes.  He has a 50.00 pack-year smoking history. He has never used smokeless tobacco. He reports that he drinks alcohol. He reports that he does not use drugs.  Family History  Problem Relation Age of Onset  . Cancer Brother     pancreatic     ROS:                                                                                                                                       History obtained from chart review  General ROS: negative for - chills, fatigue, fever, night sweats, weight gain or weight loss Psychological ROS: negative for - behavioral disorder, hallucinations, memory difficulties, mood swings or suicidal ideation Ophthalmic ROS: negative for - blurry vision, double vision, eye  pain or loss of vision ENT ROS: negative for - epistaxis, nasal discharge, oral lesions, sore throat, tinnitus or vertigo Allergy and Immunology ROS: negative for - hives or itchy/watery eyes Hematological and Lymphatic ROS: negative for - bleeding problems, bruising or swollen lymph nodes Endocrine ROS: negative for - galactorrhea, hair pattern changes, polydipsia/polyuria or temperature intolerance Respiratory ROS: negative for - cough, hemoptysis, shortness of breath or wheezing Cardiovascular ROS: negative for - chest pain, dyspnea on exertion, edema or irregular heartbeat Gastrointestinal ROS: negative for - abdominal pain, diarrhea, hematemesis, nausea/vomiting or stool incontinence Genito-Urinary ROS: negative for - dysuria, hematuria, incontinence or urinary  frequency/urgency Musculoskeletal ROS: negative for - joint swelling or muscular weakness Neurological ROS: as noted in HPI Dermatological ROS: negative for rash and skin lesion changes   General Exam                                                                                                      Blood pressure 128/78, pulse 74, temperature 98 F (36.7 C), temperature source Oral, resp. rate 18, height 5\' 11"  (1.803 m), SpO2 95 %. HEENT-  Normocephalic, no lesions, without obvious abnormality.  Normal external eye and conjunctiva.  Normal TM's bilaterally.  Normal auditory canals and external ears. Normal external nose, mucus membranes and septum.  Normal pharynx. Cardiovascular- regular rate and rhythm, S1, S2 normal, no murmur, click, rub or gallop, pulses palpable throughout   Lungs- chest clear, no wheezing, rales, normal symmetric air entry, Heart exam - S1, S2 normal, no murmur, no gallop, rate regular Abdomen- soft, non-tender; bowel sounds normal; no masses,  no organomegaly Extremities- less then 2 second capillary refill Lymph-no adenopathy palpable Musculoskeletal-bilateral lower extremity edema. Skin- hyperpigmentation distally in the lower extremities.  Neurological Examination Mental Status: Alert, oriented, thought content appropriate.  Speech fluent without evidence of aphasia.  Able to follow 3 step commands without difficulty. Cranial Nerves: Pupils are equal round and reactive to light and accommodation the fundi are poorly visualized. Visual acuity is 20/20 on the left and 20/50 on the right. There is no facial asymmetry. Motor: Right : Upper extremity   5/5    Left:     Upper extremity   5/5  Lower extremity   5/5     Lower extremity   5/5 Tone and bulk:normal tone throughout; no atrophy noted Sensory: Sensation is decreased pinprick and light touch distally in the lower extremities the level of the knees Deep Tendon Reflexes: 1-2+ and symmetric  throughout Plantars: Right: downgoing   Left: downgoing Cerebellar: normal finger-to-nose, normal rapid alternating movements and normal heel-to-shin test     Lab Results: Basic Metabolic Panel:  Recent Labs Lab 08/11/16 1703 08/12/16 0048 08/13/16 0241 08/14/16 0455 08/15/16 1314 08/15/16 1334  NA 135  --  136 135 135 139  K 4.3  --  3.7 4.1 3.9 3.9  CL 100*  --  103 104 104 101  CO2 27  --  23 22 24   --   GLUCOSE 243*  --  216* 241* 253* 254*  BUN 9  --  10 10 9 10   CREATININE 0.71 0.68 0.65 0.65 0.72 0.60*  CALCIUM 9.5  --  9.2 9.1 9.6  --     Liver Function Tests:  Recent Labs Lab 08/11/16 1703 08/15/16 1314  AST 20 27  ALT 29 36  ALKPHOS 107 97  BILITOT 0.5 1.1  PROT 7.6 7.7  ALBUMIN 4.1 4.1   No results for input(s): LIPASE, AMYLASE in the last 168 hours. No results for input(s): AMMONIA in the last 168 hours.  CBC:  Recent Labs Lab 08/11/16 1703 08/12/16 0048 08/13/16 0241 08/14/16 0455 08/15/16 1314 08/15/16 1334  WBC 12.3* 12.6* 11.4* 11.3* 10.0  --   NEUTROABS 6.0  --   --   --  5.0  --   HGB 17.0 15.9 15.8 15.8 17.8* 17.7*  HCT 50.7 47.4 46.8 47.3 51.0 52.0  MCV 86.2 86.3 85.7 85.5 85.6  --   PLT 198 202 197 186 203  --     Cardiac Enzymes: No results for input(s): CKTOTAL, CKMB, CKMBINDEX, TROPONINI in the last 168 hours.  Lipid Panel:  Recent Labs Lab 08/12/16 0048  CHOL 161  TRIG 332*  HDL 24*  CHOLHDL 6.7  VLDL 66*  LDLCALC 71    CBG:  Recent Labs Lab 08/13/16 1132 08/13/16 1623 08/13/16 2116 08/14/16 0615 08/15/16 1704  GLUCAP 295* 271* 323* 256* 220*    Microbiology: No results found for this or any previous visit.  Coagulation Studies:  Recent Labs  08/15/16 1314  LABPROT 13.4  INR 1.02    Imaging: Ct Head Wo Contrast  Result Date: 08/15/2016 CLINICAL DATA:  Partial vision loss in the right eye. EXAM: CT HEAD WITHOUT CONTRAST TECHNIQUE: Contiguous axial images were obtained from the base of the  skull through the vertex without intravenous contrast. COMPARISON:  Brain MRI 08/12/2016, head CT 08/11/2016 FINDINGS: Brain: There is a stable area of hypoattenuation in the subcortical region of the posterior right frontal lobe, corresponding to known acute ischemic infarct. The lacunar infarcts in the right occipital lobe, demonstrated by recent MRI, are not well seen by CT. No evidence of acute hemorrhage, hydrocephalus, extra-axial collection or mass lesion/ mass effect. Mild brain parenchymal volume loss. Vascular: No hyperdense vessel or unexpected calcification. Skull: No displaced fractures. Sinuses/Orbits: No acute finding. Other: None. IMPRESSION: Stable area of hypoattenuation in the subcortical posterior right frontal lobe, corresponding to known acute non hemorrhagic infarct. No evidence of intracranial hemorrhage or new areas of infarction visible by CT. Mild brain parenchymal volume loss. Electronically Signed   By: Fidela Salisbury M.D.   On: 08/15/2016 14:46    Assessment/Plan:  Dezmen returns with symptoms of recurrence of decreased visual acuity in the right eye. His previous evaluation revealed the presence of embolic stroke to several regions of the brain as well as to the right eye. Funduscopic exam was somewhat limited but revealed no clear evidence of hemorrhage. The initial discomfort has resolved at this time. It would be reasonable to check ocular pressures to evaluate for the possibility of glaucoma. However, it is felt to be less likely. His most likely this represents a new ischemic infarct to the right optic nerve.  [name] is already undergone a complete evaluation of risk factors for stroke. These are all being appropriately addressed. He continues on aspirin and Plavix at this time. Although he has some blurring of vision in the right eye, he does not have complete visual loss but rather has 20-50 visual acuity. 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  [name] presently has a loop recorder in place. It does not appear that admitting him to the hospital and performing additional studies would be of any benefit at this time. He already has appropriate outpatient appointments including one with his ophthalmologist. The nature of the stroke was discussed with [name] we also discussed that should he experience new symptoms he is to do as he did today and return to the emergency room for additional evaluation as indicated.  Plan:  1. Discharge to home with follow-up with regularly scheduled appointments.    [name] A. Tasia Catchings, M.D. Neurohospitalist Phone: [phone]  08/15/2016, 7:07 PM

## 2016-08-15 NOTE — Telephone Encounter (Signed)
thanx agree with plan

## 2016-08-16 ENCOUNTER — Encounter: Payer: Self-pay | Admitting: Surgery

## 2016-08-16 ENCOUNTER — Ambulatory Visit: Payer: Self-pay | Admitting: Cardiology

## 2016-08-16 ENCOUNTER — Encounter (HOSPITAL_COMMUNITY): Payer: Self-pay

## 2016-08-24 ENCOUNTER — Ambulatory Visit (INDEPENDENT_AMBULATORY_CARE_PROVIDER_SITE_OTHER): Payer: BLUE CROSS/BLUE SHIELD | Admitting: *Deleted

## 2016-08-24 DIAGNOSIS — Z95818 Presence of other cardiac implants and grafts: Secondary | ICD-10-CM

## 2016-08-24 DIAGNOSIS — I639 Cerebral infarction, unspecified: Secondary | ICD-10-CM

## 2016-08-24 LAB — CUP PACEART INCLINIC DEVICE CHECK: MDC IDC SESS DTM: 20170831160709

## 2016-08-24 NOTE — Progress Notes (Signed)
ILR wound check appointment. Steri-strips previously removed by patient. Wound without redness or edema. Incision edges approximated, wound well healed. Normal device function. Battery status: good. R-waves 0.71mV. No symptom, tachy, pause, brady, or AF episodes. Pause and brady detection reprogrammed off (no hx of syncope). Educated patient about Carelink monitor. Ordered landline adapter via tech services due to poor cell signal at home. Monthly summary reports and ROV with GT PRN.

## 2016-08-25 ENCOUNTER — Other Ambulatory Visit: Payer: Self-pay

## 2016-08-25 NOTE — Patient Outreach (Signed)
[place] [facility]) Care Management  08/25/2016  [name] [birth year] [id number]   REFERRAL DATE: 08/25/16  REFERRAL SOURCE:  EMMI stroke program REFERRAL REASON:  EMMI stroke red alert  Telephone call to patient regarding EMMI stroke red alert. Unable to reach patient.  HIPAA compliant voice message left with call back phone number.   PLAN; RNCM will attempt 2nd telephone call to patient within 1 week.  [name] RN,BSN,CCM [facility] Telephonic  [phone]

## 2016-08-29 ENCOUNTER — Encounter (INDEPENDENT_AMBULATORY_CARE_PROVIDER_SITE_OTHER): Payer: No Typology Code available for payment source | Admitting: Ophthalmology

## 2016-08-29 ENCOUNTER — Other Ambulatory Visit: Payer: Self-pay

## 2016-08-29 NOTE — Patient Outreach (Signed)
[place] [facility]) Care Management  08/29/2016  [name] Nov 13, 1951 [id number]   REFERRAL DATE; 08/25/16 REFERRAL SOURCE:  EMMI stroke transition program REFERRAL REASON:  EMMI heart failure red alert for "smoked or has been around smoke"  Second telephone call to patient regarding EMMI stroke referral.  Unable to reach patient.  HIPAA compliant voice message left with call back phone number.   PLAN;; RNCM will attempt 2nd telephone call to patient within 1 week.   [name] RN,BSN,CCM [facility] Telephonic  [phone]

## 2016-08-30 ENCOUNTER — Other Ambulatory Visit: Payer: Self-pay

## 2016-08-30 NOTE — Patient Outreach (Signed)
[place] [facility]) Care Management  08/30/2016  [name] December 08, 1951 [id number]  REFERRAL DATE; 08/25/16 REFERRAL SOURCE:  EMMI stroke transition program REFERRAL REASON:  EMMI heart failure red alert for "smoked or has been around smoke"  Third telephone call to patient regarding EMMI stroke red alert.  Unable to reach patient.  HIPAA compliant voice message left with call back phone number.   PLAN;  RNCM will send patient outreach letter and know before your go sheet. RNCM will refer patient to [place] to close due to being unable to reach patient.   [name] RN,BSN,CCM [facility] Telephonic  [phone]

## 2016-08-31 ENCOUNTER — Other Ambulatory Visit: Payer: Self-pay

## 2016-08-31 NOTE — Patient Outreach (Signed)
[place] [facility]) Care Management  08/31/2016  ARAN COWENS 1951-08-01 [id number]   REFERRAL DATE; 08/25/16 REFERRAL SOURCE: EMMI stroke transition program REFERRAL REASON: EMMI heart failure red alert for "smoked or has been around smoke"  Received voice mail message from patient.  Attempted to return patients call.  Unable to reach.  HIPAA compliant voice message left with call back phone number  PLAN; RNCM will attempt return call to patient within 1 week.  [name] RN,BSN,CCM [facility] Telephonic  [phone]

## 2016-09-01 ENCOUNTER — Ambulatory Visit: Payer: Self-pay

## 2016-09-06 ENCOUNTER — Telehealth: Payer: Self-pay | Admitting: Cardiovascular Disease

## 2016-09-06 NOTE — Telephone Encounter (Signed)
New Message  Pt voiced [name] was suppose to order a part for pt and he's calling back to check the status of it.  Please f/u with pt.

## 2016-09-11 ENCOUNTER — Encounter (INDEPENDENT_AMBULATORY_CARE_PROVIDER_SITE_OTHER): Payer: BLUE CROSS/BLUE SHIELD | Admitting: Ophthalmology

## 2016-09-11 DIAGNOSIS — D3131 Benign neoplasm of right choroid: Secondary | ICD-10-CM | POA: Diagnosis not present

## 2016-09-11 DIAGNOSIS — H2513 Age-related nuclear cataract, bilateral: Secondary | ICD-10-CM

## 2016-09-11 DIAGNOSIS — H43813 Vitreous degeneration, bilateral: Secondary | ICD-10-CM | POA: Diagnosis not present

## 2016-09-11 DIAGNOSIS — H3411 Central retinal artery occlusion, right eye: Secondary | ICD-10-CM

## 2016-09-11 NOTE — Telephone Encounter (Signed)
Informed patient that [name] ordered his land line adapter on 8/31 when he was seen in the office.   Patient states that he received it on Saturday.   Patient states that he has yet to hook up the adapter, but wanted to know if we had received any readings. I informed him that we had not received anything since 9/11.   I encouraged patient to set up his monitor with the adapter today. Patient voiced understanding.

## 2016-09-13 ENCOUNTER — Ambulatory Visit (INDEPENDENT_AMBULATORY_CARE_PROVIDER_SITE_OTHER): Payer: BLUE CROSS/BLUE SHIELD | Admitting: *Deleted

## 2016-09-13 DIAGNOSIS — I639 Cerebral infarction, unspecified: Secondary | ICD-10-CM

## 2016-09-15 NOTE — Progress Notes (Signed)
Carelink Summary Report / Loop Recorder 

## 2016-09-21 ENCOUNTER — Encounter: Payer: Self-pay | Admitting: Cardiovascular Disease

## 2016-09-21 ENCOUNTER — Ambulatory Visit (INDEPENDENT_AMBULATORY_CARE_PROVIDER_SITE_OTHER): Payer: BLUE CROSS/BLUE SHIELD | Admitting: Cardiovascular Disease

## 2016-09-21 VITALS — BP 108/60 | HR 70 | Ht 71.0 in | Wt 231.6 lb

## 2016-09-21 DIAGNOSIS — I10 Essential (primary) hypertension: Secondary | ICD-10-CM

## 2016-09-21 DIAGNOSIS — Z720 Tobacco use: Secondary | ICD-10-CM

## 2016-09-21 DIAGNOSIS — I2510 Atherosclerotic heart disease of native coronary artery without angina pectoris: Secondary | ICD-10-CM | POA: Diagnosis not present

## 2016-09-21 DIAGNOSIS — Z4509 Encounter for adjustment and management of other cardiac device: Secondary | ICD-10-CM

## 2016-09-21 DIAGNOSIS — E0843 Diabetes mellitus due to underlying condition with diabetic autonomic (poly)neuropathy: Secondary | ICD-10-CM | POA: Diagnosis not present

## 2016-09-21 DIAGNOSIS — I63411 Cerebral infarction due to embolism of right middle cerebral artery: Secondary | ICD-10-CM

## 2016-09-21 DIAGNOSIS — I872 Venous insufficiency (chronic) (peripheral): Secondary | ICD-10-CM

## 2016-09-21 DIAGNOSIS — E669 Obesity, unspecified: Secondary | ICD-10-CM

## 2016-09-21 DIAGNOSIS — E785 Hyperlipidemia, unspecified: Secondary | ICD-10-CM

## 2016-09-21 MED ORDER — ATORVASTATIN CALCIUM 40 MG PO TABS: 40.0000 mg | ORAL_TABLET | Freq: Every day | ORAL | 1 refills | Status: DC

## 2016-09-21 NOTE — Progress Notes (Signed)
Cardiology Office Note    Date:  09/21/2016   ID:  [name], DOB 03-19-1951, MRN [id number]  PCP:  Hayden Rasmussen., MD  Cardiologist:   Sanda Klein, MD   Chief Complaint  Patient presents with  . Follow-up    History of Present Illness:  [name] is a 65 y.o. male with coronary artery disease, CABG 4 in 2009, prior inferior wall infarction, hypertension, hyperlipidemia, type 2 diabetes mellitus, venous insufficiency of the lower extremities and a recent right frontal cortical infarct, felt to be likely embolic in mechanism.   TEE performed August 21 did not show clear evidence of an embolic source, although there was late arrival of saline contrast and left atrium felt to be secondary to pulmonary shunt. LVEF was 45-50% with inferior wall hypokinesis and there was mild mitral regurgitation. A loop recorder was implanted the same day (Dr. Cristopher Peru). Carotid duplex ultrasound showed only mild plaque in the bilateral internal carotid arteries and was antegrade flow in the vertebral arteries  Since the stroke he has not had any problems with new neurological events. His loop recorder site is a little tender but has healed well and there is no evidence of infection. He has not been aware of any palpitations. No arrhythmia has been detected on the loop recorder, interrogated today. The R-wave signal is excellent with voltage of 0.66 mV.  He continues to smoke 5 or 6 cigarettes a day although he does express interest in gradually quitting altogether. He has not had angina pectoris. He denies exertional dyspnea. He has chronic lower extremity edema and hyperpigmentation as well as varicose veins, not changed recently. He denies any bleeding problems. Glycemic control is poor but improving (A1c 10.6%, but recent fasting blood sugar has been around 130). He is compliant with statin therapy and his last LDL was 71, although his HDL was low at 24 and triglycerides were high at 332.  He is compliant with aspirin.  Past Medical History:  Diagnosis Date  . Chronic venous insufficiency LOWER EXTREMITIES  . Coronary artery disease CARDIOLOGIST - DR  [id number]  LAST 1 WK AGO -- WILL REQUEST NOTE AND STRESS TEST  . Hyperglycemia   . Hyperlipidemia   . Hypertension   . Mixed dyslipidemia   . Myocardial infarction ([place])   . Nocturia   . Prostate cancer ([place]) 08/18/11   gleason 8, volume 24.4cc  . S/P CABG x 4   . ST elevation MI (STEMI) ([place]) 02-10-2008   S/P CABG  . Urinary hesitancy     Past Surgical History:  Procedure Laterality Date  . CARDIAC CATHETERIZATION  05/01/2012   grafts widely patent  . CARDIOVASCULAR STRESS TEST  03-15-2010   INFERIOR WALL SCAR WITHOUT ANY MEANINGFUL ISCHEMIA/ EF 46% / LOW RISK SCAN  . CORONARY ARTERY BYPASS GRAFT  02-10-2008  DR Einar Gip   X4 VESSEL DISEASE / [facility] CABG  . CYSTOSCOPY  02/08/2012   Procedure: CYSTOSCOPY FLEXIBLE;  Surgeon: Franchot Gallo, MD;  Location[facility];  Service: Urology;;  . EP IMPLANTABLE DEVICE N/A 08/14/2016   Procedure: Loop Recorder Insertion;  Surgeon: Evans Lance, MD;  Location: [place] CV LAB;  Service: Cardiovascular;  Laterality: N/A;  . KNEE SURGERY  age 85   RIGHT  . LEFT HEART CATHETERIZATION WITH CORONARY/GRAFT ANGIOGRAM N/A 05/01/2012   Procedure: LEFT HEART CATHETERIZATION WITH Beatrix Fetters;  Surgeon: Sanda Klein, MD;  Location: [place] CATH LAB;  Service: Cardiovascular;  Laterality: N/A;  .  MULTIPLE TEETH EXTRACTIONS (23)/ FOUR QUADRANT ALVEOLPLASTY/ MANDIBULAR LATERAL EXOSTOSES REDUCTIONS  03-24-2010   CHRONIC PERIODONTITIS  . RADIOACTIVE SEED IMPLANT  02/08/2012   Procedure: RADIOACTIVE SEED IMPLANT;  Surgeon: Franchot Gallo, MD;  Location: [facility];  Service: Urology;  Laterality: N/A;  C-ARM   . SHOULDER SURGERY  1992   LEFT  . TEE WITHOUT CARDIOVERSION N/A 08/14/2016   Procedure: TRANSESOPHAGEAL ECHOCARDIOGRAM (TEE);  Surgeon:  Josue Hector, MD;  Location: [facility] ENDOSCOPY;  Service: Cardiovascular;  Laterality: N/A;    Current Medications: Outpatient Medications Prior to Visit  Medication Sig Dispense Refill  . aspirin EC 81 MG EC tablet Take 1 tablet (81 mg total) by mouth daily. 30 tablet 0  . metFORMIN (GLUCOPHAGE) 500 MG tablet Take 1 tablet (500 mg total) by mouth 2 (two) times daily with a meal. 60 tablet 0  . atorvastatin (LIPITOR) 40 MG tablet Take 1 tablet (40 mg total) by mouth daily at 6 PM. 30 tablet 0  . calcium carbonate (TUMS - DOSED IN MG ELEMENTAL CALCIUM) 500 MG chewable tablet Chew 1 tablet by mouth 2 (two) times daily as needed for indigestion or heartburn.    . naproxen sodium (ALEVE) 220 MG tablet Take 220-440 mg by mouth 2 (two) times daily as needed (for pain and sleep).    . nicotine (NICODERM CQ - DOSED IN MG/24 HOURS) 21 mg/24hr patch Place 21 mg onto the skin daily.     No facility-administered medications prior to visit.      Allergies:   Bee venom; Oysters [shellfish allergy]; and Penicillins   Social History   Social History  . Marital status: Married    Spouse name: N/A  . Number of children: N/A  . Years of education: N/A   Social History Main Topics  . Smoking status: Current Every Day Smoker    Packs/day: 1.00    Years: 50.00    Types: Cigarettes  . Smokeless tobacco: Never Used     Comment: PREVIOUSLY SMOKED 3PPD / DECREASED TO 1PPD SINCE 2009  . Alcohol use Yes     Comment: occassional  . Drug use: No  . Sexual activity: Not Asked   Other Topics Concern  . None   Social History Narrative  . None     Family History:  The patient's family history includes Cancer in his brother.   ROS:   Please see the history of present illness.    ROS All other systems reviewed and are negative.   PHYSICAL EXAM:   VS:  BP 108/60 (BP Location: Right Arm, Patient Position: Sitting, Cuff Size: Normal)   Pulse 70   Ht 5\' 11"  (1.803 m)   Wt 105.1 kg (231 lb 9.6 oz)   BMI  32.30 kg/m    GEN: Well nourished, well developed, in no acute distress  HEENT: normal  Neck: no JVD, carotid bruits, or masses Cardiac: RRR; no murmurs, rubs, or gallops,no edema , healthy loop recorder site Respiratory:  clear to auscultation bilaterally, normal work of breathing GI: soft, nontender, nondistended, + BS MS: no deformity or atrophy  Skin: warm and dry, no rash Neuro:  Alert and Oriented x 3, Strength and sensation are intact Psych: euthymic mood, full affect  Wt Readings from Last 3 Encounters:  09/21/16 105.1 kg (231 lb 9.6 oz)  08/11/16 101.6 kg (224 lb)  12/08/13 111.4 kg (245 lb 9.6 oz)      Studies/Labs Reviewed:   EKG:  EKG is not  ordered today.  The ekg ordered 8/23 demonstrates normal sinus rhythm, old inferior infarction, smaller Q waves in leads V3-V6 suggestive of anterolateral infarction, T-wave inversion in 1 and aVL  Recent Labs: 08/12/2016: TSH 3.549 08/15/2016: ALT 36; BUN 10; Creatinine, Ser 0.60; Hemoglobin 17.7; Platelets 203; Potassium 3.9; Sodium 139   Lipid Panel    Component Value Date/Time   CHOL 161 08/12/2016 0048   TRIG 332 (H) 08/12/2016 0048   HDL 24 (L) 08/12/2016 0048   CHOLHDL 6.7 08/12/2016 0048   VLDL 66 (H) 08/12/2016 0048   LDLCALC 71 08/12/2016 0048    Additional studies/ records that were reviewed today include:  Review of records from recent hospitalization for stroke    ASSESSMENT:    1. Cerebrovascular accident (CVA) due to embolism of right middle cerebral artery ([place])   2. Coronary artery disease involving native coronary artery of native heart without angina pectoris   3. Essential hypertension   4. Diabetes mellitus due to underlying condition with diabetic autonomic neuropathy, without long-term current use of insulin ([place])   5. Hyperlipidemia   6. Tobacco abuse   7. Obesity   8. Encounter for loop recorder check   9. Venous insufficiency of both lower extremities      PLAN:  In order of problems  listed above:   1. Recent CVA: His presentation was with transient vision loss in his right eye and head CT demonstrated a right frontal cortical infarct. Indeed, this suggests an embolic mechanism and he is at risk for atrial fibrillation. No residual deficit present at this time 2. CAD: Denies angina pectoris. Discussed risk of vein graft failure unless we address risk factors, particularly smoking and diabetes. 3. HTN: Fair control 4. HLP: LDL cholesterol at target, HDL and triglycerides are expected to improve with weight loss and better glycemic control 5. DM: Poorly controlled but improving. In addition to pharmacological therapy he needs to limit sweets and carbohydrates in general and his diet and exercise more. He is limited by pain in his left hip but he did well when he tried to use a stationary bicycle recently. He has bilateral lower extremity neuropathy. 6. Tobacco abuse: This is the single this risk to progression of his coronary disease, future stroke and other vascular complications. Discussed smoking cessation in detail. He has had some unusual dreams with the 14 mg nicotine patch, but I think he only needs a 7 mg dose since he is smoking less than a half a pack a day. He may not do well with Chantix because of the same complaint. 7. Obesity: He has prominent ventral adiposity, predicting his issues with diabetes and vascular disease. I would focus initially on smoking cessation but we will need to address weight loss aggressively in the future. 8. ILR: So far no arrhythmia detected. He has poor self signal at his home, but has recently been provided with a land line connection. Will check to make sure that we are receiving his transmissions. 9. Venous insufficiency of the lower extremities: He has obvious varicose veins and bilateral hyperpigmentation but only mild edema and no serious trophic skin changes. Leg elevation and compression stockings recommended.    Medication  Adjustments/Labs and Tests Ordered: Current medicines are reviewed at length with the patient today.  Concerns regarding medicines are outlined above.  Medication changes, Labs and Tests ordered today are listed in the Patient Instructions below. Patient Instructions  Dr Sallyanne Kuster recommends that you schedule a follow-up appointment in 6 months. You  will receive a reminder letter in the mail two months in advance. If you don't receive a letter, please call our office to schedule the follow-up appointment.  If you need a refill on your cardiac medications before your next appointment, please call your pharmacy.  Your physician discussed the hazards of tobacco use. Tobacco use cessation is recommended and techniques and options to help you quit were discussed.    Signed, Sanda Klein, MD  09/21/2016 8:55 AM    [facility] [address], [place], [place]  [address] Phone: [phone]; Fax: [phone]

## 2016-09-21 NOTE — Patient Instructions (Signed)
Dr Sallyanne Kuster recommends that you schedule a follow-up appointment in 6 months. You will receive a reminder letter in the mail two months in advance. If you don't receive a letter, please call our office to schedule the follow-up appointment.  If you need a refill on your cardiac medications before your next appointment, please call your pharmacy.  Your physician discussed the hazards of tobacco use. Tobacco use cessation is recommended and techniques and options to help you quit were discussed.

## 2016-09-27 ENCOUNTER — Telehealth: Payer: Self-pay | Admitting: Cardiovascular Disease

## 2016-09-27 ENCOUNTER — Other Ambulatory Visit: Payer: Self-pay | Admitting: *Deleted

## 2016-09-27 MED ORDER — APIXABAN 5 MG PO TABS: 5.0000 mg | ORAL_TABLET | Freq: Two times a day (BID) | ORAL | 11 refills | Status: DC

## 2016-09-27 NOTE — Telephone Encounter (Signed)
Implantable loop recorder documented a 98 minute episode of atrial fibrillation rapid ventricular response occurring during sleep on October 3. The episode was asymptomatic. The patient is currently on aspirin after an embolic stroke. Discussed the findings and the therapeutic implications with Mr. Lippi by phone. Will discontinue aspirin and start Eliquis 5 mg twice daily. Discussed potential bleeding side effects and when to call to report these or go for immediate medical care. Will arrange for follow-up appointment in November. He sees his neurologist in a couple of weeks.

## 2016-10-03 ENCOUNTER — Encounter: Payer: Self-pay | Admitting: Skilled Nursing Facility1

## 2016-10-03 ENCOUNTER — Encounter: Payer: BLUE CROSS/BLUE SHIELD | Attending: Family Medicine | Admitting: Skilled Nursing Facility1

## 2016-10-03 DIAGNOSIS — E119 Type 2 diabetes mellitus without complications: Secondary | ICD-10-CM

## 2016-10-03 DIAGNOSIS — Z713 Dietary counseling and surveillance: Secondary | ICD-10-CM | POA: Diagnosis not present

## 2016-10-03 NOTE — Progress Notes (Signed)

## 2016-10-07 LAB — CUP PACEART REMOTE DEVICE CHECK: MDC IDC SESS DTM: 20170920200621

## 2016-10-07 NOTE — Progress Notes (Signed)
Carelink summary report received. Battery status OK. Normal device function. No new symptom episodes, tachy episodes, brady, or pause episodes. No new AF episodes. Monthly summary reports and ROV/PRN 

## 2016-10-10 ENCOUNTER — Encounter: Payer: BLUE CROSS/BLUE SHIELD | Admitting: Dietician

## 2016-10-10 DIAGNOSIS — Z713 Dietary counseling and surveillance: Secondary | ICD-10-CM | POA: Diagnosis not present

## 2016-10-10 NOTE — Progress Notes (Signed)

## 2016-10-13 ENCOUNTER — Ambulatory Visit (INDEPENDENT_AMBULATORY_CARE_PROVIDER_SITE_OTHER): Payer: BLUE CROSS/BLUE SHIELD | Admitting: *Deleted

## 2016-10-13 ENCOUNTER — Encounter (INDEPENDENT_AMBULATORY_CARE_PROVIDER_SITE_OTHER): Payer: Self-pay

## 2016-10-13 DIAGNOSIS — Z4509 Encounter for adjustment and management of other cardiac device: Secondary | ICD-10-CM

## 2016-10-13 DIAGNOSIS — I63411 Cerebral infarction due to embolism of right middle cerebral artery: Secondary | ICD-10-CM | POA: Diagnosis not present

## 2016-10-13 NOTE — Progress Notes (Signed)
Add on for device clinic-pt came to office unscheduled with c/o pain at ILR site. I assessed site-incision well healed with no signs of redness, swelling or drainage. Pt reports that he is very active and hit the site on something recently and the site has been tender ever since. I encouraged him to be cautious at the site heals but that this sounds unconcern ing and to try tylenol should the pain impede his ADLs. He verbalized understanding. We reviewed home monitoring and I reminded him that his ILR checks will be automatic.

## 2016-10-16 NOTE — Progress Notes (Signed)
Carelink Summary Report / Loop Recorder 

## 2016-10-17 ENCOUNTER — Ambulatory Visit (INDEPENDENT_AMBULATORY_CARE_PROVIDER_SITE_OTHER): Payer: BLUE CROSS/BLUE SHIELD | Admitting: Neurology

## 2016-10-17 ENCOUNTER — Encounter: Payer: Self-pay | Admitting: Neurology

## 2016-10-17 ENCOUNTER — Ambulatory Visit: Payer: Self-pay

## 2016-10-17 VITALS — BP 103/74 | HR 85 | Wt 232.0 lb

## 2016-10-17 DIAGNOSIS — I480 Paroxysmal atrial fibrillation: Secondary | ICD-10-CM | POA: Diagnosis not present

## 2016-10-17 NOTE — Patient Instructions (Addendum)
Stroke Prevention Some medical conditions and behaviors are associated with an increased chance of having a stroke. You may prevent a stroke by making healthy choices and managing medical conditions. HOW CAN I REDUCE MY RISK OF HAVING A STROKE?   Stay physically active. Get at least 30 minutes of activity on most or all days.  Do not smoke. It may also be helpful to avoid exposure to secondhand smoke.  Limit alcohol use. Moderate alcohol use is considered to be:  No more than 2 drinks per day for men.  No more than 1 drink per day for nonpregnant women.  Eat healthy foods. This involves:  Eating 5 or more servings of fruits and vegetables a day.  Making dietary changes that address high blood pressure (hypertension), high cholesterol, diabetes, or obesity.  Manage your cholesterol levels.  Making food choices that are high in fiber and low in saturated fat, trans fat, and cholesterol may control cholesterol levels.  Take any prescribed medicines to control cholesterol as directed by your health care provider.  Manage your diabetes.  Controlling your carbohydrate and sugar intake is recommended to manage diabetes.  Take any prescribed medicines to control diabetes as directed by your health care provider.  Control your hypertension.  Making food choices that are low in salt (sodium), saturated fat, trans fat, and cholesterol is recommended to manage hypertension.  Ask your health care provider if you need treatment to lower your blood pressure. Take any prescribed medicines to control hypertension as directed by your health care provider.  If you are 18-39 years of age, have your blood pressure checked every 3-5 years. If you are 40 years of age or older, have your blood pressure checked every year.  Maintain a healthy weight.  Reducing calorie intake and making food choices that are low in sodium, saturated fat, trans fat, and cholesterol are recommended to manage  weight.  Stop drug abuse.  Avoid taking birth control pills.  Talk to your health care provider about the risks of taking birth control pills if you are over 35 years old, smoke, get migraines, or have ever had a blood clot.  Get evaluated for sleep disorders (sleep apnea).  Talk to your health care provider about getting a sleep evaluation if you snore a lot or have excessive sleepiness.  Take medicines only as directed by your health care provider.  For some people, aspirin or blood thinners (anticoagulants) are helpful in reducing the risk of forming abnormal blood clots that can lead to stroke. If you have the irregular heart rhythm of atrial fibrillation, you should be on a blood thinner unless there is a good reason you cannot take them.  Understand all your medicine instructions.  Make sure that other conditions (such as anemia or atherosclerosis) are addressed. SEEK IMMEDIATE MEDICAL CARE IF:   You have sudden weakness or numbness of the face, arm, or leg, especially on one side of the body.  Your face or eyelid droops to one side.  You have sudden confusion.  You have trouble speaking (aphasia) or understanding.  You have sudden trouble seeing in one or both eyes.  You have sudden trouble walking.  You have dizziness.  You have a loss of balance or coordination.  You have a sudden, severe headache with no known cause.  You have new chest pain or an irregular heartbeat. Any of these symptoms may represent a serious problem that is an emergency. Do not wait to see if the symptoms will   go away. Get medical help at once. Call your local emergency services (911 in [place] Do not drive yourself to the hospital.   This information is not intended to replace advice given to you by your health care provider. Make sure you discuss any questions you have with your health care provider.   Document Released: 01/18/2005 Document Revised: 01/01/2015 Document Reviewed:  06/13/2013 Elsevier Interactive Patient Education 2016 [employer]  

## 2016-10-17 NOTE — Progress Notes (Signed)
[facility] [address] [place]. [place] [address] [phone]       OFFICE FOLLOW-UP NOTE  Mr. BUDD LUCKE Date of Birth:  1951-01-01 Medical Record Number:  [id number]   HPI: 52 year Caucasian male seen today for first office follow-up visit for hospital admission for vision loss in August 2017. He is accompanied today by his wife. This is a 14-yo RH man who was sent to the ED by his ophthalmologist 0n 08/08/16 afternoon for evaluation of retinal ischemia. History was obtained directly from the patient  By Dr Shon Hale who is an excellent historian. His wife is present at the bedside and offers additional information as needed. The patient reported that he and his wife were driving to the grocery store on the morning of 08/08/16 when he suddenly lost vision in his right eye. He stated that everything went black. He continued driving and reached the store where he sat in the car for a bit. His vision eventually returned to normal after about 20 minutes and they finished their grocery shopping and returned home. On the afternoon of 08/09/16, he again lost the vision in his right eye but this time it did not return. He tracked down the contact information for his optometrist and went to see him. He states he was told that his eye looked okay on his exam and he referred him to a retinal specialist. He was seen there today and was found to have evidence of retinal ischemia in the right eye per the report of the patient and his wife--I do not have notes from the ophthalmologist at the time of my visit. He was sent to the ED for further management. Since his arrival in the ED, he has noted steady improvement in the vision in his right eye. The patient stated that his symptoms were limited to total vision loss in his right eye. He did not have any vision loss in the left eye. He denies any associated headache. He had no weakness,  double vision, vertigo, trouble talking, trouble swallowing,  dyscoordination, balance problems, or changes in his gait. He has longstanding tingling in the soles of both feet but reports no new sensory changes. He reports no prior history of stroke. CT scan of the head showed no acute abnormality and MRI scan showed tiny embolic infarcts involving right frontal lesion, right occipital region and the right posterior optic nerve. MRA of the brain showed no significant large vessel intracranial stenosis and carotid ultrasound showed no significant extracranial stenosis. Transthoracic echo showed ejection fraction of 45-50% but no definite cardiac source: Computed ounces which is a "showed no cardiac source of embolism. Patient looked recorder placed. LDL cholesterol was borderline at 71 mg percent but triglycerides were elevated. Hemoglobin A1c was elevated at 10.6. Patient was initially started on antibiotic therapy but subsequently was found to have paroxysmal atrial fibrillation on loop recorder on 09/27/16 with a 98 minute episode of A. fib with rapid ventricular response. He was started on eliquis 5 mg twice daily but cardiologist Dr. Orene Desanctis. He is tolerating it well without bleeding or bruising. His had no recurrent stroke or TIA symptoms. Patient is trying to quit and has cut back and is currently on nicotine patch but not been successful so far. He is also struggling to control his blood sugars and has been working with his primary physician Dr. Marcene Duos. He has tried some diabetic classes and feels he has already lost 5 pounds weight. He plans to  lose more.  ROS:   14 system review of systems is positive for no complaints today and all systems negative  PMH:  Past Medical History:  Diagnosis Date  . Chronic venous insufficiency LOWER EXTREMITIES  . Coronary artery disease CARDIOLOGIST - DR  [id number]  LAST 1 WK AGO -- WILL REQUEST NOTE AND STRESS TEST  . Diabetes mellitus without complication ([place])   . Hyperglycemia   . Hyperlipidemia   .  Hypertension   . Mixed dyslipidemia   . Myocardial infarction   . Neuropathy ([place])   . Nocturia   . Prostate cancer ([place]) 08/18/11   gleason 8, volume 24.4cc  . S/P CABG x 4   . ST elevation MI (STEMI) ([place]) 02-10-2008   S/P CABG  . Stroke ([place])   . Urinary hesitancy   . Vision abnormalities     Social History:  Social History   Social History  . Marital status: Married    Spouse name: N/A  . Number of children: N/A  . Years of education: N/A   Occupational History  . Not on file.   Social History Main Topics  . Smoking status: Current Every Day Smoker    Packs/day: 1.00    Years: 50.00    Types: Cigarettes  . Smokeless tobacco: Never Used     Comment: PREVIOUSLY SMOKED 3PPD / DECREASED TO 1PPD SINCE 2009  . Alcohol use 1.2 oz/week    1 Cans of beer, 1 Shots of liquor per week     Comment: occassional  . Drug use: No  . Sexual activity: Not on file   Other Topics Concern  . Not on file   Social History Narrative  . No narrative on file    Medications:   Current Outpatient Prescriptions on File Prior to Visit  Medication Sig Dispense Refill  . apixaban (ELIQUIS) 5 MG TABS tablet Take 1 tablet (5 mg total) by mouth 2 (two) times daily. 30 tablet 11  . atorvastatin (LIPITOR) 40 MG tablet Take 1 tablet (40 mg total) by mouth daily at 6 PM. 90 tablet 1  . gabapentin (NEURONTIN) 300 MG capsule Take 300 mg by mouth 3 (three) times daily. Pt taking 1 tablet in the morning , 2 tablets at dinner time    . lisinopril (PRINIVIL,ZESTRIL) 5 MG tablet Take 5 mg by mouth daily.    . metFORMIN (GLUCOPHAGE) 500 MG tablet Take 1 tablet (500 mg total) by mouth 2 (two) times daily with a meal. 60 tablet 0   No current facility-administered medications on file prior to visit.     Allergies:   Allergies  Allergen Reactions  . Bee Venom Anaphylaxis  . Oysters [Shellfish Allergy] Swelling    Swelling of hands  . Penicillins Other (See Comments)    CAUSES "FREE BLEEDING" Has  patient had a PCN reaction causing immediate rash, facial/tongue/throat swelling, SOB or lightheadedness with hypotension: No Has patient had a PCN reaction causing severe rash involving mucus membranes or skin necrosis: No Has patient had a PCN reaction that required hospitalization No Has patient had a PCN reaction occurring within the last 10 years: No If all of the above answers are "NO", then may proceed with Cephalosporin use.    Physical Exam General: obese middle aged caucasian male, seated, in no evident distress Head: head normocephalic and atraumatic.  Neck: supple with no carotid or supraclavicular bruits Cardiovascular: regular rate and rhythm, no murmurs Musculoskeletal: no deformity Skin:  no rash/petichiae Vascular:  Normal  pulses all extremities Vitals:   10/17/16 0909  BP: 103/74  Pulse: 85   Neurologic Exam Mental Status: Awake and fully alert. Oriented to place and time. Recent and remote memory intact. Attention span, concentration and fund of knowledge appropriate. Mood and affect appropriate.  Cranial Nerves: Fundoscopic exam reveals sharp disc margins. Pupils equal, briskly reactive to light. Extraocular movements full without nystagmus. Visual fields full to confrontation. Hearing intact. Facial sensation intact. Face, tongue, palate moves normally and symmetrically.  Motor: Normal bulk and tone. Normal strength in all tested extremity muscles. Sensory.: intact to touch ,pinprick .position and vibratory sensation.  Coordination: Rapid alternating movements normal in all extremities. Finger-to-nose and heel-to-shin performed accurately bilaterally. Gait and Station: Arises from chair without difficulty. Stance is normal. Gait demonstrates normal stride length and balance . Able to heel, toe and tandem walk without difficulty.  Reflexes: 1+ and symmetric. Toes downgoing.   NIHSS  0Modified Rankin  0  ASSESSMENT: 28 year Caucasian male with embolic right frontal  and occipital infarcts and August 2017 secondary to paroxysmal little fibrillation. Vascular risk factors of diabetes, hypertension, hyperlipidemia, smoking , PAF and obesity    PLAN: I had a long d/w patient and his wife about his recent embolic strokes and diagnosis of atrial fibrillation, risk for recurrent stroke/TIAs, personally independently reviewed imaging studies and stroke evaluation results and answered questions.Continue Eliquis (apixaban) daily  for secondary stroke prevention and maintain strict control of hypertension with blood pressure goal below 130/90, diabetes with hemoglobin A1c goal below 6.5% and lipids with LDL cholesterol goal below 70 mg/dL. I also advised the patient to eat a healthy diet with plenty of whole grains, cereals, fruits and vegetables, exercise regularly and maintain ideal body weight Followup in the future with  my nurse practitioner in 6 months or call earlier if necessary Greater than 50% of time during this 25 minute visit was spent on counseling,explanation of diagnosis, planning of further management, discussion with patient and family and coordination of care Antony Contras, MD  [facility] [address] [place], [place] [address]  Phone [phone] Fax [phone] Note: This document was prepared with digital dictation and possible smart phrase technology. Any transcriptional errors that result from this process are unintentional

## 2016-11-07 ENCOUNTER — Encounter: Payer: Self-pay | Admitting: Skilled Nursing Facility1

## 2016-11-07 ENCOUNTER — Encounter: Payer: BLUE CROSS/BLUE SHIELD | Attending: Family Medicine | Admitting: Skilled Nursing Facility1

## 2016-11-07 DIAGNOSIS — E119 Type 2 diabetes mellitus without complications: Secondary | ICD-10-CM | POA: Diagnosis not present

## 2016-11-07 DIAGNOSIS — Z713 Dietary counseling and surveillance: Secondary | ICD-10-CM | POA: Insufficient documentation

## 2016-11-07 NOTE — Progress Notes (Signed)
Patient was seen on 11/07/2016 for the third of a series of three diabetes self-management courses at the [facility]. The following learning objectives were met by the patient during this class:  . State the amount of activity recommended for healthy living . Describe activities suitable for individual needs . Identify ways to regularly incorporate activity into daily life . Identify barriers to activity and ways to over come these barriers  Identify diabetes medications being personally used and their primary action for lowering glucose and possible side effects . Describe role of stress on blood glucose and develop strategies to address psychosocial issues . Identify diabetes complications and ways to prevent them  Explain how to manage diabetes during illness . Evaluate success in meeting personal goal . Establish 2-3 goals that they will plan to diligently work on until they return for the  4-month follow-up visit  Goals:   I will count my carb choices at most meals and snacks  I will be active 30 minutes or more 3 times a week  I will take my diabetes medications as scheduled  Your patient has identified these potential barriers to change:  Stress  Your patient has identified their diabetes self-care support plan as  [facility] Support Group Plan:  Attend Monthly Diabetes Support Group as needed or make a future follow up appointment   

## 2016-11-10 ENCOUNTER — Telehealth: Payer: Self-pay | Admitting: Cardiology

## 2016-11-10 NOTE — Telephone Encounter (Signed)
Spoke w/ pt and requested that he send a manual transmission b/c his home monitor has not updated in at least 14 days.   

## 2016-11-11 LAB — CUP PACEART REMOTE DEVICE CHECK
Implantable Pulse Generator Implant Date: 20170821
MDC IDC SESS DTM: 20171020213522

## 2016-11-11 NOTE — Progress Notes (Signed)
Carelink summary report received. Battery status OK. Normal device function. No new symptom episodes, brady, or pause episodes. 0.4% AF, v rates elevated, +Eliquis.  Monthly summary reports and ROV/PRN

## 2016-11-13 ENCOUNTER — Ambulatory Visit (INDEPENDENT_AMBULATORY_CARE_PROVIDER_SITE_OTHER): Payer: BLUE CROSS/BLUE SHIELD | Admitting: *Deleted

## 2016-11-13 DIAGNOSIS — I63411 Cerebral infarction due to embolism of right middle cerebral artery: Secondary | ICD-10-CM

## 2016-11-13 NOTE — Progress Notes (Signed)
Carelink Summary Report / Loop Recorder 

## 2016-11-22 ENCOUNTER — Encounter: Payer: Self-pay | Admitting: Cardiovascular Disease

## 2016-11-22 ENCOUNTER — Ambulatory Visit (INDEPENDENT_AMBULATORY_CARE_PROVIDER_SITE_OTHER): Payer: BLUE CROSS/BLUE SHIELD | Admitting: Cardiovascular Disease

## 2016-11-22 VITALS — BP 101/70 | HR 73 | Ht 69.0 in | Wt 229.0 lb

## 2016-11-22 DIAGNOSIS — Z720 Tobacco use: Secondary | ICD-10-CM

## 2016-11-22 DIAGNOSIS — I480 Paroxysmal atrial fibrillation: Secondary | ICD-10-CM | POA: Insufficient documentation

## 2016-11-22 DIAGNOSIS — Z8673 Personal history of transient ischemic attack (TIA), and cerebral infarction without residual deficits: Secondary | ICD-10-CM | POA: Insufficient documentation

## 2016-11-22 DIAGNOSIS — I872 Venous insufficiency (chronic) (peripheral): Secondary | ICD-10-CM

## 2016-11-22 DIAGNOSIS — E119 Type 2 diabetes mellitus without complications: Secondary | ICD-10-CM

## 2016-11-22 DIAGNOSIS — E1169 Type 2 diabetes mellitus with other specified complication: Secondary | ICD-10-CM

## 2016-11-22 DIAGNOSIS — Z95818 Presence of other cardiac implants and grafts: Secondary | ICD-10-CM

## 2016-11-22 DIAGNOSIS — I10 Essential (primary) hypertension: Secondary | ICD-10-CM

## 2016-11-22 DIAGNOSIS — Z8679 Personal history of other diseases of the circulatory system: Secondary | ICD-10-CM

## 2016-11-22 DIAGNOSIS — I2510 Atherosclerotic heart disease of native coronary artery without angina pectoris: Secondary | ICD-10-CM

## 2016-11-22 DIAGNOSIS — Z6833 Body mass index (BMI) 33.0-33.9, adult: Secondary | ICD-10-CM

## 2016-11-22 DIAGNOSIS — E782 Mixed hyperlipidemia: Secondary | ICD-10-CM

## 2016-11-22 DIAGNOSIS — E669 Obesity, unspecified: Secondary | ICD-10-CM | POA: Insufficient documentation

## 2016-11-22 DIAGNOSIS — E6609 Other obesity due to excess calories: Secondary | ICD-10-CM

## 2016-11-22 MED ORDER — APIXABAN 5 MG PO TABS: 5.0000 mg | ORAL_TABLET | Freq: Two times a day (BID) | ORAL | 3 refills | Status: DC

## 2016-11-22 MED ORDER — LISINOPRIL 2.5 MG PO TABS: 2.5000 mg | ORAL_TABLET | Freq: Every day | ORAL | 3 refills | Status: DC

## 2016-11-22 MED ORDER — LISINOPRIL 2.5 MG PO TABS
2.5000 mg | ORAL_TABLET | Freq: Every day | ORAL | 3 refills | Status: DC
Start: 2016-11-22 — End: 2018-01-17

## 2016-11-22 MED FILL — LISINOPRIL 2.5 MG TABLET: 2.5 | 90 days supply | Qty: 90 | Fill #0

## 2016-11-22 MED FILL — ELIQUIS 5 MG TABLET: 5 | 90 days supply | Qty: 180 | Fill #0

## 2016-11-22 NOTE — Progress Notes (Signed)
Cardiology Office Note    Date:  11/22/2016   ID:  [name], DOB 1951/03/26, MRN [id number]  PCP:  Hayden Rasmussen., MD  Cardiologist:   Sanda Klein, MD   Chief Complaint  Patient presents with  . Follow-up    History of Present Illness:  [name] is a 65 y.o. male with coronary artery disease, CABG 4 in 2009, prior inferior wall infarction, hypertension, hyperlipidemia, type 2 diabetes mellitus, venous insufficiency of the lower extremities and a recent right frontal cortical infarct. Loop recorder identified a 98 minute episode of atrial fibrillation on October 3 and is now on Eliquis.  Since the stroke he has not had any problems with new neurological events.  He has not been aware of any palpitations. No further arrhythmia has been detected on the loop recorder.  He continues to smoke 15 cigarettes a day although he does express interest in gradually quitting altogether. He has not had angina pectoris. He denies exertional dyspnea. He has chronic lower extremity edema and hyperpigmentation as well as varicose veins, not changed recently. He denies any bleeding problems.   TEE performed August 21 did not show clear evidence of an embolic source, although there was late arrival of saline contrast and left atrium felt to be secondary to pulmonary shunt. LVEF was 45-50% with inferior wall hypokinesis and there was mild mitral regurgitation. A loop recorder was implanted the same day (Dr. Cristopher Peru). Carotid duplex ultrasound showed only mild plaque in the bilateral internal carotid arteries and was antegrade flow in the vertebral arteries. Glycemic control is poor but improving (A1c 10.6%, but recent fasting blood sugar has been around 130). He is compliant with statin therapy and his last LDL was 71, although his HDL was low at 24 and triglycerides were high at 332. Still smokes.  Past Medical History:  Diagnosis Date  . Chronic venous insufficiency LOWER EXTREMITIES   . Coronary artery disease CARDIOLOGIST - DR  [id number]  LAST 1 WK AGO -- WILL REQUEST NOTE AND STRESS TEST  . Diabetes mellitus without complication ([place])   . Hyperglycemia   . Hyperlipidemia   . Hypertension   . Mixed dyslipidemia   . Myocardial infarction   . Neuropathy ([place])   . Nocturia   . Prostate cancer ([place]) 08/18/11   gleason 8, volume 24.4cc  . S/P CABG x 4   . ST elevation MI (STEMI) ([place]) 02-10-2008   S/P CABG  . Stroke ([place])   . Urinary hesitancy   . Vision abnormalities     Past Surgical History:  Procedure Laterality Date  . CARDIAC CATHETERIZATION  05/01/2012   grafts widely patent  . CARDIOVASCULAR STRESS TEST  03-15-2010   INFERIOR WALL SCAR WITHOUT ANY MEANINGFUL ISCHEMIA/ EF 46% / LOW RISK SCAN  . CORONARY ARTERY BYPASS GRAFT  02-10-2008  DR Einar Gip   X4 VESSEL DISEASE / [facility] CABG  . CYSTOSCOPY  02/08/2012   Procedure: CYSTOSCOPY FLEXIBLE;  Surgeon: Franchot Gallo, MD;  Location: [facility];  Service: Urology;;  . EP IMPLANTABLE DEVICE N/A 08/14/2016   Procedure: Loop Recorder Insertion;  Surgeon: Evans Lance, MD;  Location: [facility];  Service: Cardiovascular;  Laterality: N/A;  . KNEE SURGERY  age 60   RIGHT  . LEFT HEART CATHETERIZATION WITH CORONARY/GRAFT ANGIOGRAM N/A 05/01/2012   Procedure: LEFT HEART CATHETERIZATION WITH Beatrix Fetters;  Surgeon: Sanda Klein, MD;  Location: [facility];  Service: Cardiovascular;  Laterality: N/A;  .  MULTIPLE TEETH EXTRACTIONS (23)/ FOUR QUADRANT ALVEOLPLASTY/ MANDIBULAR LATERAL EXOSTOSES REDUCTIONS  03-24-2010   CHRONIC PERIODONTITIS  . RADIOACTIVE SEED IMPLANT  02/08/2012   Procedure: RADIOACTIVE SEED IMPLANT;  Surgeon: Franchot Gallo, MD;  Location: [facility];  Service: Urology;  Laterality: N/A;  C-ARM   . SHOULDER SURGERY  1992   LEFT  . TEE WITHOUT CARDIOVERSION N/A 08/14/2016   Procedure: TRANSESOPHAGEAL ECHOCARDIOGRAM (TEE);  Surgeon: Josue Hector, MD;  Location: [facility];  Service: Cardiovascular;  Laterality: N/A;    Current Medications: Outpatient Medications Prior to Visit  Medication Sig Dispense Refill  . apixaban (ELIQUIS) 5 MG TABS tablet Take 1 tablet (5 mg total) by mouth 2 (two) times daily. 30 tablet 11  . atorvastatin (LIPITOR) 40 MG tablet Take 1 tablet (40 mg total) by mouth daily at 6 PM. 90 tablet 1  . dapagliflozin propanediol (FARXIGA) 5 MG TABS tablet Take 5 mg by mouth daily.    Marland Kitchen gabapentin (NEURONTIN) 300 MG capsule Take 300 mg by mouth 2 (two) times daily. Pt taking 1 tablet in the morning , 2 tablets at dinner time     . lisinopril (PRINIVIL,ZESTRIL) 5 MG tablet Take 5 mg by mouth daily.    . metFORMIN (GLUCOPHAGE) 500 MG tablet Take 1 tablet (500 mg total) by mouth 2 (two) times daily with a meal. 60 tablet 0  . Multiple Vitamins-Minerals (MULTIVITAMIN ADULT PO) Take 1 tablet by mouth daily.     Marland Kitchen gabapentin (NEURONTIN) 100 MG capsule   0   No facility-administered medications prior to visit.      Allergies:   Bee venom; Oysters [shellfish allergy]; and Penicillins   Social History   Social History  . Marital status: Married    Spouse name: N/A  . Number of children: N/A  . Years of education: N/A   Social History Main Topics  . Smoking status: Current Every Day Smoker    Packs/day: 1.00    Years: 50.00    Types: Cigarettes  . Smokeless tobacco: Never Used     Comment: PREVIOUSLY SMOKED 3PPD / DECREASED TO 1PPD SINCE 2009  . Alcohol use 1.2 oz/week    1 Cans of beer, 1 Shots of liquor per week     Comment: occassional  . Drug use: No  . Sexual activity: Not Asked   Other Topics Concern  . None   Social History Narrative  . None     Family History:  The patient's family history includes Cancer in his brother.   ROS:   Please see the history of present illness.    ROS All other systems reviewed and are negative.   PHYSICAL EXAM:   VS:  BP 101/70 (BP Location: Right Arm,  Patient Position: Sitting, Cuff Size: Normal)   Pulse 73   Ht 5' 9"  (1.753 m)   Wt 229 lb (103.9 kg)   SpO2 96%   BMI 33.82 kg/m    GEN: Well nourished, well developed, in no acute distress  HEENT: normal  Neck: no JVD, carotid bruits, or masses Cardiac: RRR; no murmurs, rubs, or gallops,no edema , healthy loop recorder site Respiratory:  clear to auscultation bilaterally, normal work of breathing GI: soft, nontender, nondistended, + BS MS: no deformity or atrophy  Skin: warm and dry, no rash Neuro:  Alert and Oriented x 3, Strength and sensation are intact Psych: euthymic mood, full affect  Wt Readings from Last 3 Encounters:  11/22/16 229 lb (103.9 kg)  10/17/16 232 lb (105.2 kg)  10/03/16 231 lb 12.8 oz (105.1 kg)      Studies/Labs Reviewed:   EKG:  EKG is not ordered today.  The ekg ordered 8/23 demonstrates normal sinus rhythm, old inferior infarction, smaller Q waves in leads V3-V6 suggestive of anterolateral infarction, T-wave inversion in 1 and aVL  Recent Labs: 08/12/2016: TSH 3.549 08/15/2016: ALT 36; BUN 10; Creatinine, Ser 0.60; Hemoglobin 17.7; Platelets 203; Potassium 3.9; Sodium 139   Lipid Panel    Component Value Date/Time   CHOL 161 08/12/2016 0048   TRIG 332 (H) 08/12/2016 0048   HDL 24 (L) 08/12/2016 0048   CHOLHDL 6.7 08/12/2016 0048   VLDL 66 (H) 08/12/2016 0048   LDLCALC 71 08/12/2016 0048     ASSESSMENT:    No diagnosis found.   PLAN:  In order of problems listed above:    1. AFib: low prevalence so far, asymptomatic. CHADSVasc 5 (CVA, HTN, DM, CAD). On eliquis without bleeding problems so far. 2. Recent CVA: His presentation was with transient vision loss in his right eye and head CT also demonstrated a right frontal cortical infarct. No residual deficit. 3. CAD: Denies angina pectoris. Discussed risk of vein graft failure unless we address risk factors, particularly smoking and diabetes. 4. HTN: Appears to have excessive control. BP  may be lower after starting [place]. Reduce lisinopril dose. 5. HLP: LDL cholesterol at target, HDL and triglycerides are expected to improve with weight loss and better glycemic control 6. DM: improving. He has bilateral lower extremity neuropathy. 7. Tobacco abuse: he reports cutting back, but reports smoking more now than he did when we met in August. Reinforced need to quit ASAP. 8. Obesity: He has prominent ventral adiposity, predicting his issues with diabetes and vascular disease. I would focus initially on smoking cessation but we will need to address weight loss aggressively in the future. 9. ILR:  He has poor cell signal at home, but has been able to submit manual downloads via land-line. 10. Venous insufficiency of the lower extremities: He has obvious varicose veins and bilateral hyperpigmentation but only mild edema and no serious trophic skin changes. Leg elevation and compression stockings recommended.    Medication Adjustments/Labs and Tests Ordered: Current medicines are reviewed at length with the patient today.  Concerns regarding medicines are outlined above.  Medication changes, Labs and Tests ordered today are listed in the Patient Instructions below. There are no Patient Instructions on file for this visit.   Signed, Sanda Klein, MD  11/22/2016 9:56 AM    [facility] [address], [place], [place]  [address] Phone: [phone]; Fax: [phone]

## 2016-11-22 NOTE — Patient Instructions (Signed)
Dr Sallyanne Kuster has recommended making the following medication changes: 1. DECREASE Lisinopril to 2.5 mg daily  Your physician recommends that you schedule a follow-up appointment in 12 months with Dr C. You will receive a reminder letter in the mail two months in advance. If you don't receive a letter, please call our office to schedule the follow-up appointment.  If you need a refill on your cardiac medications before your next appointment, please call your pharmacy.

## 2016-12-12 ENCOUNTER — Ambulatory Visit (INDEPENDENT_AMBULATORY_CARE_PROVIDER_SITE_OTHER): Payer: BLUE CROSS/BLUE SHIELD | Admitting: *Deleted

## 2016-12-12 DIAGNOSIS — I63411 Cerebral infarction due to embolism of right middle cerebral artery: Secondary | ICD-10-CM | POA: Diagnosis not present

## 2016-12-13 NOTE — Progress Notes (Signed)
Carelink Summary Report / Loop Recorder 

## 2016-12-14 ENCOUNTER — Telehealth: Payer: Self-pay | Admitting: Cardiology

## 2016-12-14 NOTE — Telephone Encounter (Signed)
Spoke w/ pt and requested that he send a manual transmission b/c his home monitor has not updated in at least 14 days.   

## 2016-12-22 ENCOUNTER — Telehealth: Payer: Self-pay

## 2016-12-22 NOTE — Telephone Encounter (Signed)
Eliquis samples have never been picked up by this patient. Since it has been 2 weeks, it will be placed back in our stock, which is very low.

## 2016-12-26 LAB — CUP PACEART REMOTE DEVICE CHECK
Implantable Pulse Generator Implant Date: 20170821
MDC IDC SESS DTM: 20171119223819

## 2017-01-11 ENCOUNTER — Ambulatory Visit (INDEPENDENT_AMBULATORY_CARE_PROVIDER_SITE_OTHER): Payer: BLUE CROSS/BLUE SHIELD | Admitting: *Deleted

## 2017-01-11 DIAGNOSIS — I63411 Cerebral infarction due to embolism of right middle cerebral artery: Secondary | ICD-10-CM

## 2017-01-12 NOTE — Progress Notes (Signed)
Carelink Summary Report / Loop Recorder 

## 2017-01-24 ENCOUNTER — Encounter: Payer: Self-pay | Admitting: Cardiovascular Disease

## 2017-01-24 ENCOUNTER — Other Ambulatory Visit: Payer: Self-pay | Admitting: Cardiovascular Disease

## 2017-01-30 LAB — CUP PACEART REMOTE DEVICE CHECK
Date Time Interrogation Session: 20171219233606
MDC IDC PG IMPLANT DT: 20170821

## 2017-01-31 DIAGNOSIS — E119 Type 2 diabetes mellitus without complications: Secondary | ICD-10-CM | POA: Diagnosis not present

## 2017-02-12 ENCOUNTER — Ambulatory Visit (INDEPENDENT_AMBULATORY_CARE_PROVIDER_SITE_OTHER): Payer: PPO | Admitting: *Deleted

## 2017-02-12 DIAGNOSIS — I63411 Cerebral infarction due to embolism of right middle cerebral artery: Secondary | ICD-10-CM | POA: Diagnosis not present

## 2017-02-13 NOTE — Progress Notes (Signed)
Carelink Summary Report / Loop Recorder 

## 2017-02-15 LAB — CUP PACEART REMOTE DEVICE CHECK
Date Time Interrogation Session: 20180119003623
Implantable Pulse Generator Implant Date: 20170821

## 2017-02-21 DIAGNOSIS — R3129 Other microscopic hematuria: Secondary | ICD-10-CM | POA: Diagnosis not present

## 2017-03-06 ENCOUNTER — Telehealth: Payer: Self-pay | Admitting: *Deleted

## 2017-03-06 LAB — CUP PACEART REMOTE DEVICE CHECK
Date Time Interrogation Session: 20180218004021
Implantable Pulse Generator Implant Date: 20170821

## 2017-03-06 NOTE — Telephone Encounter (Signed)
pt needs samples of eliquis 5 mg and paperwork for PA, Dr. Loletha Grayer pt, thanks

## 2017-03-07 DIAGNOSIS — R3915 Urgency of urination: Secondary | ICD-10-CM | POA: Diagnosis not present

## 2017-03-07 DIAGNOSIS — D49512 Neoplasm of unspecified behavior of left kidney: Secondary | ICD-10-CM | POA: Diagnosis not present

## 2017-03-07 DIAGNOSIS — C61 Malignant neoplasm of prostate: Secondary | ICD-10-CM | POA: Diagnosis not present

## 2017-03-07 DIAGNOSIS — N5201 Erectile dysfunction due to arterial insufficiency: Secondary | ICD-10-CM | POA: Diagnosis not present

## 2017-03-07 DIAGNOSIS — N200 Calculus of kidney: Secondary | ICD-10-CM | POA: Diagnosis not present

## 2017-03-07 DIAGNOSIS — N320 Bladder-neck obstruction: Secondary | ICD-10-CM | POA: Diagnosis not present

## 2017-03-07 NOTE — Telephone Encounter (Signed)
Patient walked in to [facility] office requesting samples & eliquis patient assistance paperwork  3 bottles of eliquis 5mg  - [id number] Exp: 02/2019 provided to patient + patient portion of patient assistance paperwork  MD portion + Rx will need to be completed/signed Patient will drop off his portion of the paperwork for our office to submit on his behalf.   Message routed to [place], [place]

## 2017-03-07 NOTE — Telephone Encounter (Signed)
Fidel Levy, RN      1:10 PM  Note    Patient walked in to [facility] office requesting samples & eliquis patient assistance paperwork  3 bottles of eliquis 5mg  - [id number] Exp: 02/2019 provided to patient + patient portion of patient assistance paperwork  MD portion + Rx will need to be completed/signed Patient will drop off his portion of the paperwork for our office to submit on his behalf.   Message routed to [facility], [place]          1:10 PM    [name] contacted Fidel Levy, RN  March 06, 2017  Me  to Dionne Bucy Truitt, CMA       10:52 AM  pt needs samples of eliquis 5 mg and paperwork for PA, Dr. Loletha Grayer pt, thanks  Me      10:51 AM  Note    pt needs samples of eliquis 5 mg and paperwork for PA, Dr. Loletha Grayer pt, thanks    [name]  to Me       10:50 AM  pt needs samples of eliquis 5 mg and paperwork for PA, Dr. Loletha Grayer pt, thanks  This encounter is not signed. The conversation may still be ongoing.  Additional Documentation   Encounter Info:   Billing Info,   History,   Allergies,   Detailed Report

## 2017-03-12 ENCOUNTER — Ambulatory Visit (INDEPENDENT_AMBULATORY_CARE_PROVIDER_SITE_OTHER): Payer: PPO | Admitting: *Deleted

## 2017-03-12 DIAGNOSIS — I63411 Cerebral infarction due to embolism of right middle cerebral artery: Secondary | ICD-10-CM

## 2017-03-13 NOTE — Progress Notes (Signed)
Carelink Summary Report / Loop Recorder 

## 2017-03-15 ENCOUNTER — Telehealth: Payer: Self-pay | Admitting: Cardiology

## 2017-03-15 NOTE — Telephone Encounter (Signed)
Spoke w/ pt and requested that he send a manual transmission b/c his home monitor has not updated in at least 14 days. Pt stated that he has been staying with his mother and that is why his monitor isn't downloading automatically. I informed pt that he can take his monitor to his mothers house and send the transmission that way. Pt verbalized understanding and said he would try this tonight.

## 2017-03-25 LAB — CUP PACEART REMOTE DEVICE CHECK
Date Time Interrogation Session: 20180320003829
Implantable Pulse Generator Implant Date: 20170821

## 2017-03-25 NOTE — Progress Notes (Signed)
Carelink summary report received. Battery status OK. Normal device function. No new symptom episodes, tachy episodes, brady, or pause episodes. No new AF episodes. Monthly summary reports and ROV/PRN 

## 2017-03-27 ENCOUNTER — Other Ambulatory Visit: Payer: Self-pay | Admitting: Cardiovascular Disease

## 2017-03-28 DIAGNOSIS — R351 Nocturia: Secondary | ICD-10-CM | POA: Diagnosis not present

## 2017-04-02 DIAGNOSIS — I10 Essential (primary) hypertension: Secondary | ICD-10-CM | POA: Diagnosis not present

## 2017-04-02 DIAGNOSIS — E785 Hyperlipidemia, unspecified: Secondary | ICD-10-CM | POA: Diagnosis not present

## 2017-04-02 DIAGNOSIS — E119 Type 2 diabetes mellitus without complications: Secondary | ICD-10-CM | POA: Diagnosis not present

## 2017-04-04 ENCOUNTER — Telehealth: Payer: Self-pay | Admitting: Cardiovascular Disease

## 2017-04-04 MED ORDER — APIXABAN 5 MG PO TABS: 5.0000 mg | ORAL_TABLET | Freq: Two times a day (BID) | ORAL | 1 refills | Status: DC

## 2017-04-04 NOTE — Telephone Encounter (Signed)
New message    *STAT* If patient is at the pharmacy, call can be transferred to refill team.   1. Which medications need to be refilled? (please list name of each medication and dose if known) apixaban (ELIQUIS) 5 MG TABS tablet  2. Which pharmacy/location (including street and city if local pharmacy) is medication to be sent to? [facility] on [address]  3. Do they need a 30 day or 90 day supply? 90 day supply

## 2017-04-11 ENCOUNTER — Ambulatory Visit (INDEPENDENT_AMBULATORY_CARE_PROVIDER_SITE_OTHER): Payer: PPO | Admitting: *Deleted

## 2017-04-11 DIAGNOSIS — I63411 Cerebral infarction due to embolism of right middle cerebral artery: Secondary | ICD-10-CM

## 2017-04-12 NOTE — Progress Notes (Signed)
Carelink Summary Report / Loop Recorder 

## 2017-04-17 ENCOUNTER — Ambulatory Visit: Payer: BLUE CROSS/BLUE SHIELD | Admitting: Nurse Practitioner

## 2017-04-21 LAB — CUP PACEART REMOTE DEVICE CHECK
Date Time Interrogation Session: 20180419013810
MDC IDC PG IMPLANT DT: 20170821

## 2017-05-11 ENCOUNTER — Ambulatory Visit (INDEPENDENT_AMBULATORY_CARE_PROVIDER_SITE_OTHER): Payer: PPO | Admitting: *Deleted

## 2017-05-11 DIAGNOSIS — I63411 Cerebral infarction due to embolism of right middle cerebral artery: Secondary | ICD-10-CM | POA: Diagnosis not present

## 2017-05-14 NOTE — Progress Notes (Signed)
Carelink Summary Report / Loop Recorder 

## 2017-05-21 LAB — CUP PACEART REMOTE DEVICE CHECK
Date Time Interrogation Session: 20180519020620
MDC IDC PG IMPLANT DT: 20170821

## 2017-06-11 ENCOUNTER — Ambulatory Visit (INDEPENDENT_AMBULATORY_CARE_PROVIDER_SITE_OTHER): Payer: PPO | Admitting: *Deleted

## 2017-06-11 DIAGNOSIS — I63411 Cerebral infarction due to embolism of right middle cerebral artery: Secondary | ICD-10-CM

## 2017-06-11 NOTE — Progress Notes (Signed)
Carelink Summary Report / Loop Recorder 

## 2017-06-19 LAB — CUP PACEART REMOTE DEVICE CHECK
Date Time Interrogation Session: 20180618020939
MDC IDC PG IMPLANT DT: 20170821

## 2017-07-10 ENCOUNTER — Ambulatory Visit (INDEPENDENT_AMBULATORY_CARE_PROVIDER_SITE_OTHER): Payer: PPO | Admitting: *Deleted

## 2017-07-10 DIAGNOSIS — I63411 Cerebral infarction due to embolism of right middle cerebral artery: Secondary | ICD-10-CM | POA: Diagnosis not present

## 2017-07-11 NOTE — Progress Notes (Signed)
Carelink Summary Report / Loop Recorder 

## 2017-07-24 LAB — CUP PACEART REMOTE DEVICE CHECK
Date Time Interrogation Session: 20180718024653
Implantable Pulse Generator Implant Date: 20170821

## 2017-07-24 NOTE — Progress Notes (Signed)
Carelink summary report received. Battery status OK. Normal device function. No new symptom episodes, tachy episodes, brady, or pause episodes. No new AF episodes. Monthly summary reports and ROV/PRN 

## 2017-08-08 DIAGNOSIS — H6122 Impacted cerumen, left ear: Secondary | ICD-10-CM | POA: Diagnosis not present

## 2017-08-08 DIAGNOSIS — H6063 Unspecified chronic otitis externa, bilateral: Secondary | ICD-10-CM | POA: Diagnosis not present

## 2017-08-08 DIAGNOSIS — H9313 Tinnitus, bilateral: Secondary | ICD-10-CM | POA: Diagnosis not present

## 2017-08-08 DIAGNOSIS — H903 Sensorineural hearing loss, bilateral: Secondary | ICD-10-CM | POA: Diagnosis not present

## 2017-08-09 ENCOUNTER — Ambulatory Visit (INDEPENDENT_AMBULATORY_CARE_PROVIDER_SITE_OTHER): Payer: PPO | Admitting: *Deleted

## 2017-08-09 DIAGNOSIS — I63411 Cerebral infarction due to embolism of right middle cerebral artery: Secondary | ICD-10-CM

## 2017-08-10 NOTE — Progress Notes (Signed)
Loop recorder summary report 

## 2017-08-13 DIAGNOSIS — H6122 Impacted cerumen, left ear: Secondary | ICD-10-CM | POA: Diagnosis not present

## 2017-08-13 DIAGNOSIS — R351 Nocturia: Secondary | ICD-10-CM | POA: Diagnosis not present

## 2017-08-13 DIAGNOSIS — R310 Gross hematuria: Secondary | ICD-10-CM | POA: Diagnosis not present

## 2017-08-13 DIAGNOSIS — H6062 Unspecified chronic otitis externa, left ear: Secondary | ICD-10-CM | POA: Diagnosis not present

## 2017-08-15 DIAGNOSIS — N5201 Erectile dysfunction due to arterial insufficiency: Secondary | ICD-10-CM | POA: Diagnosis not present

## 2017-08-15 DIAGNOSIS — R310 Gross hematuria: Secondary | ICD-10-CM | POA: Diagnosis not present

## 2017-08-15 DIAGNOSIS — C61 Malignant neoplasm of prostate: Secondary | ICD-10-CM | POA: Diagnosis not present

## 2017-08-15 DIAGNOSIS — N200 Calculus of kidney: Secondary | ICD-10-CM | POA: Diagnosis not present

## 2017-08-17 LAB — CUP PACEART REMOTE DEVICE CHECK
Implantable Pulse Generator Implant Date: 20170821
MDC IDC SESS DTM: 20180817023809

## 2017-09-03 DIAGNOSIS — H6122 Impacted cerumen, left ear: Secondary | ICD-10-CM | POA: Diagnosis not present

## 2017-09-03 DIAGNOSIS — H6062 Unspecified chronic otitis externa, left ear: Secondary | ICD-10-CM | POA: Diagnosis not present

## 2017-09-10 ENCOUNTER — Ambulatory Visit (INDEPENDENT_AMBULATORY_CARE_PROVIDER_SITE_OTHER): Payer: PPO | Admitting: *Deleted

## 2017-09-10 DIAGNOSIS — I63411 Cerebral infarction due to embolism of right middle cerebral artery: Secondary | ICD-10-CM

## 2017-09-10 NOTE — Progress Notes (Signed)
Carelink Summary Report / Loop Recorder 

## 2017-09-11 DIAGNOSIS — H6122 Impacted cerumen, left ear: Secondary | ICD-10-CM | POA: Diagnosis not present

## 2017-09-11 DIAGNOSIS — H6062 Unspecified chronic otitis externa, left ear: Secondary | ICD-10-CM | POA: Diagnosis not present

## 2017-09-13 DIAGNOSIS — H6062 Unspecified chronic otitis externa, left ear: Secondary | ICD-10-CM | POA: Diagnosis not present

## 2017-09-13 DIAGNOSIS — H6122 Impacted cerumen, left ear: Secondary | ICD-10-CM | POA: Diagnosis not present

## 2017-09-13 LAB — CUP PACEART REMOTE DEVICE CHECK
Date Time Interrogation Session: 20180916141441
Implantable Pulse Generator Implant Date: 20170821

## 2017-09-17 DIAGNOSIS — H6122 Impacted cerumen, left ear: Secondary | ICD-10-CM | POA: Diagnosis not present

## 2017-09-17 DIAGNOSIS — H6062 Unspecified chronic otitis externa, left ear: Secondary | ICD-10-CM | POA: Diagnosis not present

## 2017-09-20 DIAGNOSIS — H6122 Impacted cerumen, left ear: Secondary | ICD-10-CM | POA: Diagnosis not present

## 2017-10-09 ENCOUNTER — Ambulatory Visit (INDEPENDENT_AMBULATORY_CARE_PROVIDER_SITE_OTHER): Payer: PPO | Admitting: *Deleted

## 2017-10-09 DIAGNOSIS — I63411 Cerebral infarction due to embolism of right middle cerebral artery: Secondary | ICD-10-CM

## 2017-10-09 NOTE — Progress Notes (Signed)
Carelink Summary Report / Loop Recorder 

## 2017-10-10 DIAGNOSIS — H6122 Impacted cerumen, left ear: Secondary | ICD-10-CM | POA: Diagnosis not present

## 2017-10-10 DIAGNOSIS — H903 Sensorineural hearing loss, bilateral: Secondary | ICD-10-CM | POA: Diagnosis not present

## 2017-10-12 LAB — CUP PACEART REMOTE DEVICE CHECK
Date Time Interrogation Session: 20181016154044
MDC IDC PG IMPLANT DT: 20170821

## 2017-11-08 ENCOUNTER — Ambulatory Visit (INDEPENDENT_AMBULATORY_CARE_PROVIDER_SITE_OTHER): Payer: PPO | Admitting: *Deleted

## 2017-11-08 DIAGNOSIS — I63411 Cerebral infarction due to embolism of right middle cerebral artery: Secondary | ICD-10-CM

## 2017-11-09 NOTE — Progress Notes (Signed)
Carelink Summary Report / Loop Recorder 

## 2017-11-20 DIAGNOSIS — H6122 Impacted cerumen, left ear: Secondary | ICD-10-CM | POA: Diagnosis not present

## 2017-11-20 DIAGNOSIS — H6062 Unspecified chronic otitis externa, left ear: Secondary | ICD-10-CM | POA: Diagnosis not present

## 2017-11-25 NOTE — Progress Notes (Addendum)
Subjective:    Patient ID: [name], male    DOB: 1951/09/16, 66 y.o.   MRN: [id number]  HPI:  [name] is here to establish as a new pt.  He is a pleasant 66 year old male.  PMH: MI with CABG 2009, A fib, CVA in 07/2016, HL, HTN, T2D, nocturia,hx of kidney stones, and tobacco use- 1.5 pack/day for > 50 years- he is interested in smoking cessation- GREAT! He is retired and remains quite active with hobbies- cooking, car restoration, house work, yard [employer], working in [employer].  He follows diabetic diet and drinks coffee, unsweat tea, and occasionally water.  He reports infrequently checking his BS and when he does in the Am it runs 120-160s.  He denies episodes of hypoglycemia.   He is followed by cards/Dr. Sallyanne Kuster He reports sound, restful sleep and denies any acute complaints today. He lives with his 80 year old mother that has dementia.  Patient Care Team    Relationship Specialty Notifications Start End  Esaw Grandchild, NP PCP - General Family Medicine  11/27/17     Patient Active Problem List   Diagnosis Date Noted  . Need for pneumococcal vaccination 11/27/2017  . Screening for colon cancer 11/27/2017  . Healthcare maintenance 11/27/2017  . Paroxysmal atrial fibrillation ([place]) 11/22/2016  . History of embolic stroke 17/40/8144  . Diabetes mellitus type 2 in obese ([place]) 11/22/2016  . Venous insufficiency of both lower extremities 09/21/2016  . Stroke ([place]) 08/12/2016  . Hyperglycemia   . Essential hypertension   . Vision, loss, sudden   . CVA (cerebral infarction) 08/11/2016  . CAD - inferior MI 2009, CABG 2009, patent grafts cath 04/2012 09/23/2013  . Obesity 09/23/2013  . Hyperlipidemia 09/23/2013  . Tobacco abuse 09/23/2013  . Prostate cancer ([place]) 08/18/2011  . Myocardial infarction [facility])      Past Medical History:  Diagnosis Date  . Chronic venous insufficiency LOWER EXTREMITIES  . Coronary artery disease CARDIOLOGIST - DR  YJEHUDJS-  LAST 1 WK AGO -- WILL  REQUEST NOTE AND STRESS TEST  . Diabetes mellitus without complication ([place])   . Hyperglycemia   . Hyperlipidemia   . Hypertension   . Mixed dyslipidemia   . Myocardial infarction ([place])   . Neuropathy   . Nocturia   . Prostate cancer ([place]) 08/18/11   gleason 8, volume 24.4cc  . S/P CABG x 4   . ST elevation MI (STEMI) ([place]) 02-10-2008   S/P CABG  . Stroke ([place])   . Urinary hesitancy   . Vision abnormalities      Past Surgical History:  Procedure Laterality Date  . CARDIAC CATHETERIZATION  05/01/2012   grafts widely patent  . CARDIOVASCULAR STRESS TEST  03-15-2010   INFERIOR WALL SCAR WITHOUT ANY MEANINGFUL ISCHEMIA/ EF 46% / LOW RISK SCAN  . CORONARY ARTERY BYPASS GRAFT  02-10-2008  DR Einar Gip   X4 VESSEL DISEASE / [facility] CABG  . CYSTOSCOPY  02/08/2012   Procedure: CYSTOSCOPY FLEXIBLE;  Surgeon: Franchot Gallo, MD;  Location: [facility];  Service: Urology;;  . EP IMPLANTABLE DEVICE N/A 08/14/2016   Procedure: Loop Recorder Insertion;  Surgeon: Evans Lance, MD;  Location: [facility];  Service: Cardiovascular;  Laterality: N/A;  . KNEE SURGERY  age 55   RIGHT  . LEFT HEART CATHETERIZATION WITH CORONARY/GRAFT ANGIOGRAM N/A 05/01/2012   Procedure: LEFT HEART CATHETERIZATION WITH Beatrix Fetters;  Surgeon: Sanda Klein, MD;  Location: [facility];  Service: Cardiovascular;  Laterality: N/A;  . MULTIPLE TEETH EXTRACTIONS (23)/ FOUR QUADRANT ALVEOLPLASTY/ MANDIBULAR LATERAL EXOSTOSES REDUCTIONS  03-24-2010   CHRONIC PERIODONTITIS  . RADIOACTIVE SEED IMPLANT  02/08/2012   Procedure: RADIOACTIVE SEED IMPLANT;  Surgeon: Franchot Gallo, MD;  Location: [facility];  Service: Urology;  Laterality: N/A;  C-ARM   . SHOULDER SURGERY  1992   LEFT  . TEE WITHOUT CARDIOVERSION N/A 08/14/2016   Procedure: TRANSESOPHAGEAL ECHOCARDIOGRAM (TEE);  Surgeon: Josue Hector, MD;  Location: [facility] ENDOSCOPY;  Service: Cardiovascular;  Laterality: N/A;      Family History  Problem Relation Age of Onset  . Cancer Father        pancreatic  . Diabetes Father      Social History   Substance and Sexual Activity  Drug Use No     Social History   Substance and Sexual Activity  Alcohol Use Yes  . Alcohol/week: 1.2 oz  . Types: 1 Cans of beer, 1 Shots of liquor per week   Comment: occassional     Social History   Tobacco Use  Smoking Status Current Every Day Smoker  . Packs/day: 1.50  . Years: 50.00  . Pack years: 75.00  . Types: Cigarettes  Smokeless Tobacco Never Used  Tobacco Comment   PREVIOUSLY SMOKED 3PPD / DECREASED TO 1PPD SINCE 2009     Outpatient Encounter Medications as of 11/27/2017  Medication Sig  . apixaban (ELIQUIS) 5 MG TABS tablet Take 1 tablet (5 mg total) by mouth 2 (two) times daily.  Marland Kitchen atorvastatin (LIPITOR) 40 MG tablet TAKE ONE TABLET BY MOUTH ONCE DAILY AT 6 PM  . dapagliflozin propanediol (FARXIGA) 5 MG TABS tablet Take 5 mg by mouth daily.  Marland Kitchen gabapentin (NEURONTIN) 300 MG capsule Take 1 capsule (300 mg total) by mouth 2 (two) times daily. Pt taking 1 tablet in the morning , 2 tablets at dinner time  . lisinopril (PRINIVIL,ZESTRIL) 2.5 MG tablet Take 1 tablet (2.5 mg total) by mouth daily.  . metFORMIN (GLUCOPHAGE) 500 MG tablet Take 1 tablet (500 mg total) by mouth 2 (two) times daily with a meal.  . Multiple Vitamins-Minerals (MULTIVITAMIN ADULT PO) Take 1 tablet by mouth daily.   . [DISCONTINUED] gabapentin (NEURONTIN) 300 MG capsule Take 300 mg by mouth 2 (two) times daily. Pt taking 1 tablet in the morning , 2 tablets at dinner time   . [DISCONTINUED] metFORMIN (GLUCOPHAGE) 500 MG tablet Take 1 tablet (500 mg total) by mouth 2 (two) times daily with a meal.  . buPROPion (WELLBUTRIN XL) 150 MG 24 hr tablet One tablet daily for three days, then increase to one tablet twice daily  . tamsulosin (FLOMAX) 0.4 MG CAPS capsule Take 1 capsule (0.4 mg total) by mouth at bedtime.  . [DISCONTINUED]  tamsulosin (FLOMAX) 0.4 MG CAPS capsule Take 1 capsule by mouth at bedtime.   No facility-administered encounter medications on file as of 11/27/2017.     Allergies: Bee venom; Oysters [shellfish allergy]; and Penicillins  Body mass index is 34.66 kg/m.  Blood pressure 126/73, pulse 84, height 5' 8.25" (1.734 m), weight 229 lb 9.6 oz (104.1 kg).      Review of Systems  Constitutional: Positive for fatigue. Negative for activity change, appetite change, chills, diaphoresis, fever and unexpected weight change.  HENT: Negative for congestion.   Eyes: Negative for visual disturbance.  Respiratory: Negative for cough, chest tightness, shortness of breath, wheezing and stridor.   Cardiovascular: Negative for chest pain, palpitations  and leg swelling.  Gastrointestinal: Negative for abdominal distention, abdominal pain, blood in stool, constipation, diarrhea and nausea.  Endocrine: Negative for cold intolerance, heat intolerance, polydipsia, polyphagia and polyuria.  Genitourinary: Negative for difficulty urinating, flank pain and hematuria.  Skin: Negative for color change, pallor, rash and wound.  Neurological: Negative for dizziness and headaches.  Hematological: Does not bruise/bleed easily.  Psychiatric/Behavioral: Negative for dysphoric mood, self-injury, sleep disturbance and suicidal ideas. The patient is not nervous/anxious.        Objective:   Physical Exam  Constitutional: He is oriented to person, place, and time. He appears well-developed and well-nourished. No distress.  HENT:  Head: Normocephalic and atraumatic.  Right Ear: External ear normal.  Left Ear: External ear normal.  Cardiovascular: Normal rate, regular rhythm, normal heart sounds and intact distal pulses.  No murmur heard. Pulmonary/Chest: Effort normal and breath sounds normal. No respiratory distress. He has no wheezes. He has no rales.  Neurological: He is alert and oriented to person, place, and time.   Skin: Skin is warm and dry. No rash noted. He is not diaphoretic. No erythema. No pallor.  Psychiatric: He has a normal mood and affect. His behavior is normal. Judgment and thought content normal.  Nursing note and vitals reviewed.         Assessment & Plan:   1. Hyperglycemia   2. Need for Tdap vaccination   3. Need for pneumococcal vaccination   4. Screening for colon cancer   5. Healthcare maintenance   6. Other hyperlipidemia   7. Tobacco abuse     Healthcare maintenance Increase water intake, strive for at least 110 ounces/day.   Follow Heart Healthy diet Continue regular movement. Please call your cardiologist for refills on : Lipitor, Lisinopril, Benzapril, Coreg, and Eliquis. Please schedule complete physical with fasting labs in 3 months.  Hyperglycemia A1c today 7.7 Microalbumin abnormal Foot Exam- abnormal He has been off Metformin for > 2 weeks   Tobacco abuse Currently smokes 1.5 pack for > 50 years Please start Wellbutrin 150mg  once daily for three days, then increase to one tablet twice daily.  Set quite date in 2 weeks- YOU CAN DO IT!    FOLLOW-UP:  Return in about 3 months (around 02/25/2018) for CPE, Fasting Lab Draw.

## 2017-11-26 LAB — CUP PACEART REMOTE DEVICE CHECK
Date Time Interrogation Session: 20181115164120
MDC IDC PG IMPLANT DT: 20170821

## 2017-11-27 ENCOUNTER — Other Ambulatory Visit: Payer: Self-pay

## 2017-11-27 ENCOUNTER — Encounter: Payer: Self-pay | Admitting: Adult Health

## 2017-11-27 ENCOUNTER — Other Ambulatory Visit: Payer: Self-pay | Admitting: Adult Health

## 2017-11-27 ENCOUNTER — Ambulatory Visit: Payer: PPO | Admitting: Adult Health

## 2017-11-27 VITALS — BP 126/73 | HR 84 | Ht 68.25 in | Wt 229.6 lb

## 2017-11-27 DIAGNOSIS — R739 Hyperglycemia, unspecified: Secondary | ICD-10-CM | POA: Diagnosis not present

## 2017-11-27 DIAGNOSIS — Z720 Tobacco use: Secondary | ICD-10-CM

## 2017-11-27 DIAGNOSIS — Z0000 Encounter for general adult medical examination without abnormal findings: Secondary | ICD-10-CM

## 2017-11-27 DIAGNOSIS — I6523 Occlusion and stenosis of bilateral carotid arteries: Secondary | ICD-10-CM

## 2017-11-27 DIAGNOSIS — Z23 Encounter for immunization: Secondary | ICD-10-CM | POA: Insufficient documentation

## 2017-11-27 DIAGNOSIS — Z1211 Encounter for screening for malignant neoplasm of colon: Secondary | ICD-10-CM | POA: Insufficient documentation

## 2017-11-27 DIAGNOSIS — E7849 Other hyperlipidemia: Secondary | ICD-10-CM

## 2017-11-27 LAB — POCT UA - MICROALBUMIN
Creatinine, POC: 200 mg/dL
Microalbumin Ur, POC: 150 mg/L

## 2017-11-27 LAB — POCT GLYCOSYLATED HEMOGLOBIN (HGB A1C): Hemoglobin A1C: 7.7

## 2017-11-27 MED ORDER — GABAPENTIN 300 MG PO CAPS: 300.0000 mg | ORAL_CAPSULE | Freq: Two times a day (BID) | ORAL | 1 refills | Status: DC

## 2017-11-27 MED ORDER — METFORMIN HCL 500 MG PO TABS: 500.0000 mg | ORAL_TABLET | Freq: Two times a day (BID) | ORAL | 0 refills | Status: DC

## 2017-11-27 MED ORDER — BUPROPION HCL ER (XL) 150 MG PO TB24: ORAL_TABLET | ORAL | 0 refills | Status: DC

## 2017-11-27 MED ORDER — GABAPENTIN 300 MG PO CAPS: ORAL_CAPSULE | ORAL | 1 refills | Status: DC

## 2017-11-27 MED ORDER — TAMSULOSIN HCL 0.4 MG PO CAPS: 0.4000 mg | ORAL_CAPSULE | Freq: Every day | ORAL | 1 refills | Status: DC

## 2017-11-27 NOTE — Assessment & Plan Note (Signed)
Currently smokes 1.5 pack for > 50 years Please start Wellbutrin 150mg  once daily for three days, then increase to one tablet twice daily.  Set quite date in 2 weeks- YOU CAN DO IT!

## 2017-11-27 NOTE — Assessment & Plan Note (Signed)
>>  ASSESSMENT AND PLAN FOR HYPERGLYCEMIA WRITTEN ON 11/27/2017  2:00 PM BY DANFORD, KATY D, NP  A1c today 7.7 Microalbumin abnormal Foot Exam- abnormal He has been off Metformin for > 2 weeks

## 2017-11-27 NOTE — Progress Notes (Signed)
Received fax from [facility] to clarify sig for gabapentin.  RX resent to pharmacy with correct sig.  Charyl Bigger, CMA

## 2017-11-27 NOTE — Assessment & Plan Note (Signed)
A1c today 7.7 Microalbumin abnormal Foot Exam- abnormal He has been off Metformin for > 2 weeks

## 2017-11-27 NOTE — Assessment & Plan Note (Signed)
Increase water intake, strive for at least 110 ounces/day.   Follow Heart Healthy diet Continue regular movement. Please call your cardiologist for refills on : Lipitor, Lisinopril, Benzapril, Coreg, and Eliquis. Please schedule complete physical with fasting labs in 3 months.

## 2017-11-27 NOTE — Patient Instructions (Addendum)
Diabetes Mellitus and Food It is important for you to manage your blood sugar (glucose) level. Your blood glucose level can be greatly affected by what you eat. Eating healthier foods in the appropriate amounts throughout the day at about the same time each day will help you control your blood glucose level. It can also help slow or prevent worsening of your diabetes mellitus. Healthy eating may even help you improve the level of your blood pressure and reach or maintain a healthy weight. General recommendations for healthful eating and cooking habits include:  Eating meals and snacks regularly. Avoid going long periods of time without eating to lose weight.  Eating a diet that consists mainly of plant-based foods, such as fruits, vegetables, nuts, legumes, and whole grains.  Using low-heat cooking methods, such as baking, instead of high-heat cooking methods, such as deep frying.  Work with your dietitian to make sure you understand how to use the Nutrition Facts information on food labels. How can food affect me? Carbohydrates Carbohydrates affect your blood glucose level more than any other type of food. Your dietitian will help you determine how many carbohydrates to eat at each meal and teach you how to count carbohydrates. Counting carbohydrates is important to keep your blood glucose at a healthy level, especially if you are using insulin or taking certain medicines for diabetes mellitus. Alcohol Alcohol can cause sudden decreases in blood glucose (hypoglycemia), especially if you use insulin or take certain medicines for diabetes mellitus. Hypoglycemia can be a life-threatening condition. Symptoms of hypoglycemia (sleepiness, dizziness, and disorientation) are similar to symptoms of having too much alcohol. If your health care provider has given you approval to drink alcohol, do so in moderation and use the following guidelines:  Women should not have more than one drink per day, and men  should not have more than two drinks per day. One drink is equal to: ? 12 oz of beer. ? 5 oz of wine. ? 1 oz of hard liquor.  Do not drink on an empty stomach.  Keep yourself hydrated. Have water, diet soda, or unsweetened iced tea.  Regular soda, juice, and other mixers might contain a lot of carbohydrates and should be counted.  What foods are not recommended? As you make food choices, it is important to remember that all foods are not the same. Some foods have fewer nutrients per serving than other foods, even though they might have the same number of calories or carbohydrates. It is difficult to get your body what it needs when you eat foods with fewer nutrients. Examples of foods that you should avoid that are high in calories and carbohydrates but low in nutrients include:  Trans fats (most processed foods list trans fats on the Nutrition Facts label).  Regular soda.  Juice.  Candy.  Sweets, such as cake, pie, doughnuts, and cookies.  Fried foods.  What foods can I eat? Eat nutrient-rich foods, which will nourish your body and keep you healthy. The food you should eat also will depend on several factors, including:  The calories you need.  The medicines you take.  Your weight.  Your blood glucose level.  Your blood pressure level.  Your cholesterol level.  You should eat a variety of foods, including:  Protein. ? Lean cuts of meat. ? Proteins low in saturated fats, such as fish, egg whites, and beans. Avoid processed meats.  Fruits and vegetables. ? Fruits and vegetables that may help control blood glucose levels, such as apples,   mangoes, and yams.  Dairy products. ? Choose fat-free or low-fat dairy products, such as milk, yogurt, and cheese.  Grains, bread, pasta, and rice. ? Choose whole grain products, such as multigrain bread, whole oats, and brown rice. These foods may help control blood pressure.  Fats. ? Foods containing healthful fats, such as  nuts, avocado, olive oil, canola oil, and fish.  Does everyone with diabetes mellitus have the same meal plan? Because every person with diabetes mellitus is different, there is not one meal plan that works for everyone. It is very important that you meet with a dietitian who will help you create a meal plan that is just right for you. This information is not intended to replace advice given to you by your health care provider. Make sure you discuss any questions you have with your health care provider. Document Released: 09/07/2005 Document Revised: 05/18/2016 Document Reviewed: 11/07/2013 Elsevier Interactive Patient Education  2017 [place].  Increase water intake, strive for at least 110 ounces/day.   Follow Heart Healthy diet Continue regular movement. Please call your cardiologist for refills on : Lipitor, Lisinopril, Benzapril, Coreg, and Eliquis. Please start Wellbutrin 150mg  once daily for three days, then increase to one tablet twice daily.  Set quite date in 2 weeks- YOU CAN DO IT! Please schedule complete physical with fasting labs in 3 months. WELCOME TO THE PRACTICE

## 2017-11-29 DIAGNOSIS — H6122 Impacted cerumen, left ear: Secondary | ICD-10-CM | POA: Diagnosis not present

## 2017-12-10 ENCOUNTER — Ambulatory Visit (INDEPENDENT_AMBULATORY_CARE_PROVIDER_SITE_OTHER): Payer: PPO | Admitting: *Deleted

## 2017-12-10 DIAGNOSIS — I63411 Cerebral infarction due to embolism of right middle cerebral artery: Secondary | ICD-10-CM

## 2017-12-10 NOTE — Progress Notes (Signed)
Carelink Summary Report / Loop Recorder 

## 2017-12-12 ENCOUNTER — Encounter: Payer: Self-pay | Admitting: Adult Health

## 2017-12-12 ENCOUNTER — Telehealth: Payer: Self-pay | Admitting: Adult Health

## 2017-12-12 NOTE — Telephone Encounter (Signed)
Left patient a msg to call office with Eye doctor's name & ph# if he has that information-- after due diligence search I was unable to locate a  Dr. Zeb Comfort ophthalmologist.  --Dion Body

## 2017-12-26 LAB — CUP PACEART REMOTE DEVICE CHECK
Implantable Pulse Generator Implant Date: 20170821
MDC IDC SESS DTM: 20181215171013

## 2017-12-31 ENCOUNTER — Encounter: Payer: Self-pay | Admitting: Adult Health

## 2018-01-01 ENCOUNTER — Other Ambulatory Visit: Payer: Self-pay | Admitting: Cardiovascular Disease

## 2018-01-07 ENCOUNTER — Ambulatory Visit (INDEPENDENT_AMBULATORY_CARE_PROVIDER_SITE_OTHER): Payer: PPO | Admitting: Adult Health

## 2018-01-07 ENCOUNTER — Encounter: Payer: Self-pay | Admitting: Adult Health

## 2018-01-07 ENCOUNTER — Ambulatory Visit (INDEPENDENT_AMBULATORY_CARE_PROVIDER_SITE_OTHER): Payer: PPO | Admitting: *Deleted

## 2018-01-07 VITALS — BP 113/68 | HR 68 | Ht 68.25 in | Wt 232.1 lb

## 2018-01-07 DIAGNOSIS — Z23 Encounter for immunization: Secondary | ICD-10-CM

## 2018-01-07 DIAGNOSIS — Z9189 Other specified personal risk factors, not elsewhere classified: Secondary | ICD-10-CM | POA: Diagnosis not present

## 2018-01-07 DIAGNOSIS — I63411 Cerebral infarction due to embolism of right middle cerebral artery: Secondary | ICD-10-CM

## 2018-01-07 DIAGNOSIS — S91139A Puncture wound without foreign body of unspecified toe(s) without damage to nail, initial encounter: Secondary | ICD-10-CM | POA: Diagnosis not present

## 2018-01-07 MED ORDER — DOXYCYCLINE HYCLATE 100 MG PO TABS: 100.0000 mg | ORAL_TABLET | Freq: Two times a day (BID) | ORAL | 0 refills | Status: DC

## 2018-01-07 NOTE — Assessment & Plan Note (Addendum)
Tetanus Immunization updated today- observed for reactions, none noted. Take Doxycycline as directed. Keep wound dry, clean, covered. Wound care instructions provided. Referral to Podiatrist placed. If symptoms persist after antibiotic completed, then please call clinic.

## 2018-01-07 NOTE — Progress Notes (Signed)
Subjective:    Patient ID: [name], male    DOB: 05/18/1951, 68 y.o.   MRN: [id number]  HPI: 11/27/17 OV:  [name] is here to establish as a new pt.  He is a pleasant 67 year old male.  PMH: MI with CABG 2009, A fib, CVA in 07/2016, HL, HTN, T2D, nocturia,hx of kidney stones, and tobacco use- 1.5 pack/day for > 50 years- he is interested in smoking cessation- GREAT! He is retired and remains quite active with hobbies- cooking, car restoration, house work, yard [employer], working in [employer].  He follows diabetic diet and drinks coffee, unsweat tea, and occasionally water.  He reports infrequently checking his BS and when he does in the Am it runs 120-160s.  He denies episodes of hypoglycemia.   He is followed by cards/Dr. Sallyanne Kuster He reports sound, restful sleep and denies any acute complaints today. He lives with his 62 year old mother that has dementia.  01/07/18 OV: [name] presents with L great toe pain that started sat-intermittent, throbbing pain rated 9/10, worsens with prolonged standing/walking.  He thinks "I may have stepped on something in my Daddy's tool shed" that would cause a puncture.  He estimates that this may have occurred over the last 6 weeks. He denies fever/night sweats/poor appetite.  He denies drainage from site. He has never been seen by podiatrist. He cannot recall last Tetanus immunization.  Patient Care Team    Relationship Specialty Notifications Start End  Esaw Grandchild, NP PCP - General Family Medicine  11/27/17     Patient Active Problem List   Diagnosis Date Noted  . Puncture wound of toe of left foot 01/07/2018  . At risk for diabetic foot ulcer 01/07/2018  . Need for pneumococcal vaccination 11/27/2017  . Screening for colon cancer 11/27/2017  . Healthcare maintenance 11/27/2017  . Paroxysmal atrial fibrillation ([place]) 11/22/2016  . History of embolic stroke 25/04/3975  . Diabetes mellitus type 2 in obese ([place]) 11/22/2016  . Venous  insufficiency of both lower extremities 09/21/2016  . Stroke ([place]) 08/12/2016  . Hyperglycemia   . Essential hypertension   . Vision, loss, sudden   . CVA (cerebral infarction) 08/11/2016  . CAD - inferior MI 2009, CABG 2009, patent grafts cath 04/2012 09/23/2013  . Obesity 09/23/2013  . Hyperlipidemia 09/23/2013  . Tobacco abuse 09/23/2013  . Prostate cancer ([place]) 08/18/2011  . Myocardial infarction [facility])      Past Medical History:  Diagnosis Date  . Chronic venous insufficiency LOWER EXTREMITIES  . Coronary artery disease CARDIOLOGIST - DR  BHALPFXT-  LAST 1 WK AGO -- WILL REQUEST NOTE AND STRESS TEST  . Diabetes mellitus without complication ([place])   . Hyperglycemia   . Hyperlipidemia   . Hypertension   . Mixed dyslipidemia   . Myocardial infarction ([place])   . Neuropathy   . Nocturia   . Prostate cancer ([place]) 08/18/11   gleason 8, volume 24.4cc  . S/P CABG x 4   . ST elevation MI (STEMI) ([place]) 02-10-2008   S/P CABG  . Stroke ([place])   . Urinary hesitancy   . Vision abnormalities      Past Surgical History:  Procedure Laterality Date  . CARDIAC CATHETERIZATION  05/01/2012   grafts widely patent  . CARDIOVASCULAR STRESS TEST  03-15-2010   INFERIOR WALL SCAR WITHOUT ANY MEANINGFUL ISCHEMIA/ EF 46% / LOW RISK SCAN  . CORONARY ARTERY BYPASS GRAFT  02-10-2008  DR Einar Gip   X4 VESSEL  DISEASE / EMERGANT CABG  . CYSTOSCOPY  02/08/2012   Procedure: CYSTOSCOPY FLEXIBLE;  Surgeon: Franchot Gallo, MD;  Location: [facility];  Service: Urology;;  . EP IMPLANTABLE DEVICE N/A 08/14/2016   Procedure: Loop Recorder Insertion;  Surgeon: Evans Lance, MD;  Location: [facility];  Service: Cardiovascular;  Laterality: N/A;  . KNEE SURGERY  age 43   RIGHT  . LEFT HEART CATHETERIZATION WITH CORONARY/GRAFT ANGIOGRAM N/A 05/01/2012   Procedure: LEFT HEART CATHETERIZATION WITH Beatrix Fetters;  Surgeon: Sanda Klein, MD;  Location: [facility];  Service:  Cardiovascular;  Laterality: N/A;  . MULTIPLE TEETH EXTRACTIONS (23)/ FOUR QUADRANT ALVEOLPLASTY/ MANDIBULAR LATERAL EXOSTOSES REDUCTIONS  03-24-2010   CHRONIC PERIODONTITIS  . RADIOACTIVE SEED IMPLANT  02/08/2012   Procedure: RADIOACTIVE SEED IMPLANT;  Surgeon: Franchot Gallo, MD;  Location: [facility];  Service: Urology;  Laterality: N/A;  C-ARM   . SHOULDER SURGERY  1992   LEFT  . TEE WITHOUT CARDIOVERSION N/A 08/14/2016   Procedure: TRANSESOPHAGEAL ECHOCARDIOGRAM (TEE);  Surgeon: Josue Hector, MD;  Location: [facility];  Service: Cardiovascular;  Laterality: N/A;     Family History  Problem Relation Age of Onset  . Cancer Father        pancreatic  . Diabetes Father      Social History   Substance and Sexual Activity  Drug Use No     Social History   Substance and Sexual Activity  Alcohol Use Yes  . Alcohol/week: 1.2 oz  . Types: 1 Cans of beer, 1 Shots of liquor per week   Comment: occassional     Social History   Tobacco Use  Smoking Status Current Every Day Smoker  . Packs/day: 1.50  . Years: 50.00  . Pack years: 75.00  . Types: Cigarettes  Smokeless Tobacco Never Used  Tobacco Comment   PREVIOUSLY SMOKED 3PPD / DECREASED TO 1PPD SINCE 2009     Outpatient Encounter Medications as of 01/07/2018  Medication Sig  . atorvastatin (LIPITOR) 40 MG tablet TAKE ONE TABLET BY MOUTH ONCE DAILY AT 6 PM  . buPROPion (WELLBUTRIN XL) 150 MG 24 hr tablet One tablet daily for three days, then increase to one tablet twice daily  . dapagliflozin propanediol (FARXIGA) 5 MG TABS tablet Take 5 mg by mouth daily.  Marland Kitchen gabapentin (NEURONTIN) 300 MG capsule Take 1 tablet every morning and 2 tablets at dinner time.  Marland Kitchen lisinopril (PRINIVIL,ZESTRIL) 2.5 MG tablet Take 1 tablet (2.5 mg total) by mouth daily.  . metFORMIN (GLUCOPHAGE) 500 MG tablet Take 1 tablet (500 mg total) by mouth 2 (two) times daily with a meal.  . Multiple Vitamins-Minerals (MULTIVITAMIN  ADULT PO) Take 1 tablet by mouth daily.   . tamsulosin (FLOMAX) 0.4 MG CAPS capsule Take 1 capsule (0.4 mg total) by mouth at bedtime.  Marland Kitchen apixaban (ELIQUIS) 5 MG TABS tablet Take 1 tablet (5 mg total) by mouth 2 (two) times daily. (Patient taking differently: Take 5 mg by mouth daily. )  . doxycycline (VIBRA-TABS) 100 MG tablet Take 1 tablet (100 mg total) by mouth 2 (two) times daily.   No facility-administered encounter medications on file as of 01/07/2018.     Allergies: Bee venom; Oysters [shellfish allergy]; and Penicillins  Body mass index is 35.03 kg/m.  Blood pressure 113/68, pulse 68, height 5' 8.25" (1.734 m), weight 232 lb 1.6 oz (105.3 kg), SpO2 96 %.      Review of Systems  Constitutional: Positive for  fatigue. Negative for activity change, appetite change, chills, diaphoresis, fever and unexpected weight change.  HENT: Negative for congestion.   Eyes: Negative for visual disturbance.  Respiratory: Negative for cough, chest tightness, shortness of breath, wheezing and stridor.   Cardiovascular: Negative for chest pain, palpitations and leg swelling.  Gastrointestinal: Negative for abdominal distention, abdominal pain, blood in stool, constipation, diarrhea and nausea.  Endocrine: Negative for cold intolerance, heat intolerance, polydipsia, polyphagia and polyuria.  Musculoskeletal: Positive for myalgias.  Skin: Positive for wound. Negative for color change, pallor and rash.  Neurological: Negative for dizziness and headaches.  Hematological: Does not bruise/bleed easily.  Psychiatric/Behavioral: Negative for dysphoric mood, self-injury, sleep disturbance and suicidal ideas. The patient is not nervous/anxious.        Objective:   Physical Exam  Constitutional: He is oriented to person, place, and time. He appears well-developed and well-nourished. No distress.  HENT:  Head: Normocephalic and atraumatic.  Right Ear: External ear normal.  Left Ear: External ear  normal.  Cardiovascular: Normal rate, regular rhythm, normal heart sounds and intact distal pulses.  No murmur heard. Pulmonary/Chest: Effort normal and breath sounds normal. No respiratory distress. He has no wheezes. He has no rales.  Musculoskeletal: He exhibits edema and tenderness.       Feet:  Discrete puncture wound on bottom of L great toe Area reddened with slight edema. No drainage/streaking/excessive warmth noted.  Thick, crusted toenails noted on boot feet. L great toenail has circular "hole" in middle of nail.  Neurological: He is alert and oriented to person, place, and time.  Skin: Skin is warm and dry. No rash noted. He is not diaphoretic. No erythema. No pallor.  Psychiatric: He has a normal mood and affect. His behavior is normal. Judgment and thought content normal.  Nursing note and vitals reviewed.         Assessment & Plan:   1. Need for Tdap vaccination   2. Puncture wound of toe of left foot, initial encounter   3. At risk for diabetic foot ulcer     At risk for diabetic foot ulcer Referral to Podiatrist placed.  Puncture wound of toe of left foot Take Doxycycline as directed. Keep wound dry, clean, covered. Wound care instructions provided. Referral to Podiatrist placed. If symptoms persist after antibiotic completed, then please call clinic.    FOLLOW-UP:  Return if symptoms worsen or fail to improve.

## 2018-01-07 NOTE — Assessment & Plan Note (Signed)
Referral to Podiatrist placed.

## 2018-01-07 NOTE — Patient Instructions (Signed)
Wound Care, Adult Taking care of your wound properly can help to prevent pain and infection. It can also help your wound to heal more quickly. How is this treated? Wound care Follow instructions from your health care provider about how to take care of your wound. Make sure you: Wash your hands with soap and water before you change the bandage (dressing). If soap and water are not available, use hand sanitizer. Change your dressing as told by your health care provider. Leave stitches (sutures), skin glue, or adhesive strips in place. These skin closures may need to stay in place for 2 weeks or longer. If adhesive strip edges start to loosen and curl up, you may trim the loose edges. Do not remove adhesive strips completely unless your health care provider tells you to do that. Check your wound area every day for signs of infection. Check for: More redness, swelling, or pain. More fluid or blood. Warmth. Pus or a bad smell. Ask your health care provider if you should clean the wound with mild soap and water. Doing this may include: Using a clean towel to pat the wound dry after cleaning it. Do not rub or scrub the wound. Applying a cream or ointment. Do this only as told by your health care provider. Covering the incision with a clean dressing. Ask your health care provider when you can leave the wound uncovered. Medicines  If you were prescribed an antibiotic medicine, cream, or ointment, take or use the antibiotic as told by your health care provider. Do not stop taking or using the antibiotic even if your condition improves. Take over-the-counter and prescription medicines only as told by your health care provider. If you were prescribed pain medicine, take it at least 30 minutes before doing any wound care or as told by your health care provider. General instructions Return to your normal activities as told by your health care provider. Ask your health care provider what activities are  safe. Do not scratch or pick at the wound. Keep all follow-up visits as told by your health care provider. This is important. Eat a diet that includes protein, vitamin A, vitamin C, and other nutrient-rich foods. These help the wound heal: Protein-rich foods include meat, dairy, beans, nuts, and other sources. Vitamin A-rich foods include carrots and dark green, leafy vegetables. Vitamin C-rich foods include citrus, tomatoes, and other fruits and vegetables. Nutrient-rich foods have protein, carbohydrates, fat, vitamins, or minerals. Eat a variety of healthy foods including vegetables, fruits, and whole grains. Contact a health care provider if: You received a tetanus shot and you have swelling, severe pain, redness, or bleeding at the injection site. Your pain is not controlled with medicine. You have more redness, swelling, or pain around the wound. You have more fluid or blood coming from the wound. Your wound feels warm to the touch. You have pus or a bad smell coming from the wound. You have a fever or chills. You are nauseous or you vomit. You are dizzy. Get help right away if: You have a red streak going away from your wound. The edges of the wound open up and separate. Your wound is bleeding and the bleeding does not stop with gentle pressure. You have a rash. You faint. You have trouble breathing. This information is not intended to replace advice given to you by your health care provider. Make sure you discuss any questions you have with your health care provider. Document Released: 09/19/2008 Document Revised: 08/09/2016 Document Reviewed: 06/27/2016  Elsevier Interactive Patient Education  2017 [place]. Doxycycline tablets or capsules What is this medicine? DOXYCYCLINE (dox i SYE kleen) is a tetracycline antibiotic. It kills certain bacteria or stops their growth. It is used to treat many kinds of infections, like dental, skin, respiratory, and urinary tract infections.  It also treats acne, Lyme disease, malaria, and certain sexually transmitted infections. This medicine may be used for other purposes; ask your health care provider or pharmacist if you have questions. COMMON BRAND NAME(S): Acticlate, Adoxa, Adoxa CK, Adoxa Pak, Adoxa TT, Alodox, Avidoxy, Doxal, Mondoxyne NL, Monodox, Morgidox 1x, Morgidox 1x Kit, Morgidox 2x, Morgidox 2x Kit, NutriDox, Ocudox, TARGADOX, Vibra-Tabs, Vibramycin What should I tell my health care provider before I take this medicine? They need to know if you have any of these conditions: -liver disease -long exposure to sunlight like working outdoors -stomach problems like colitis -an unusual or allergic reaction to doxycycline, tetracycline antibiotics, other medicines, foods, dyes, or preservatives -pregnant or trying to get pregnant -breast-feeding How should I use this medicine? Take this medicine by mouth with a full glass of water. Follow the directions on the prescription label. It is best to take this medicine without food, but if it upsets your stomach take it with food. Take your medicine at regular intervals. Do not take your medicine more often than directed. Take all of your medicine as directed even if you think you are better. Do not skip doses or stop your medicine early. Talk to your pediatrician regarding the use of this medicine in children. While this drug may be prescribed for selected conditions, precautions do apply. Overdosage: If you think you have taken too much of this medicine contact a poison control center or emergency room at once. NOTE: This medicine is only for you. Do not share this medicine with others. What if I miss a dose? If you miss a dose, take it as soon as you can. If it is almost time for your next dose, take only that dose. Do not take double or extra doses. What may interact with this medicine? -antacids -barbiturates -birth control pills -bismuth  subsalicylate -carbamazepine -methoxyflurane -other antibiotics -phenytoin -vitamins that contain iron -warfarin This list may not describe all possible interactions. Give your health care provider a list of all the medicines, herbs, non-prescription drugs, or dietary supplements you use. Also tell them if you smoke, drink alcohol, or use illegal drugs. Some items may interact with your medicine. What should I watch for while using this medicine? Tell your doctor or health care professional if your symptoms do not improve. Do not treat diarrhea with over the counter products. Contact your doctor if you have diarrhea that lasts more than 2 days or if it is severe and watery. Do not take this medicine just before going to bed. It may not dissolve properly when you lay down and can cause pain in your throat. Drink plenty of fluids while taking this medicine to also help reduce irritation in your throat. This medicine can make you more sensitive to the sun. Keep out of the sun. If you cannot avoid being in the sun, wear protective clothing and use sunscreen. Do not use sun lamps or tanning beds/booths. Birth control pills may not work properly while you are taking this medicine. Talk to your doctor about using an extra method of birth control. If you are being treated for a sexually transmitted infection, avoid sexual contact until you have finished your treatment. Your sexual partner may  also need treatment. Avoid antacids, aluminum, calcium, magnesium, and iron products for 4 hours before and 2 hours after taking a dose of this medicine. If you are using this medicine to prevent malaria, you should still protect yourself from contact with mosquitos. Stay in screened-in areas, use mosquito nets, keep your body covered, and use an insect repellent. What side effects may I notice from receiving this medicine? Side effects that you should report to your doctor or health care professional as soon as  possible: -allergic reactions like skin rash, itching or hives, swelling of the face, lips, or tongue -difficulty breathing -fever -itching in the rectal or genital area -pain on swallowing -redness, blistering, peeling or loosening of the skin, including inside the mouth -severe stomach pain or cramps -unusual bleeding or bruising -unusually weak or tired -yellowing of the eyes or skin Side effects that usually do not require medical attention (report to your doctor or health care professional if they continue or are bothersome): -diarrhea -loss of appetite -nausea, vomiting This list may not describe all possible side effects. Call your doctor for medical advice about side effects. You may report side effects to FDA at 1-800-FDA-1088. Where should I keep my medicine? Keep out of the reach of children. Store at room temperature, below 30 degrees C (86 degrees F). Protect from light. Keep container tightly closed. Throw away any unused medicine after the expiration date. Taking this medicine after the expiration date can make you seriously ill. NOTE: This sheet is a summary. It may not cover all possible information. If you have questions about this medicine, talk to your doctor, pharmacist, or health care provider.  2018 Elsevier/Gold Standard (2016-01-12 17:11:22)  Diabetes Mellitus and Skin Care Diabetes (diabetes mellitus) can lead to health problems over time, including skin problems. People with diabetes have a higher risk for many types of skin complications. This is because having poorly controlled blood sugar (glucose) levels can:  Damage nerves and blood vessels. This can result in decreased feeling in your legs and feet, which means you may not notice minor skin injuries that could lead to serious problems.  Reduce blood flow (circulation), which makes wounds heal more slowly and increases your risk of infection.  Cause areas of skin to become thick or discolored.  What are  some common skin conditions that affect people with diabetes? Diabetes often causes dry skin. It can also cause the skin on the feet to get thinner, break more easily, and heal more slowly. There are certain skin conditions that commonly affect people who have diabetes, such as:  Bacterial skin infections, such as styes, boils, infected hair follicles, and infections of the skin around the nails.  Fungal skin infections. These are most common in areas where skin rubs together, such as in the armpits or under the breasts.  Open sores, especially on the feet.  Tissue death (gangrene). This can happen on your feet if a serious infection does not heal properly. Gangrene can cause the need for a foot or leg to be surgically removed (amputated).  Diabetes can also cause the skin to change. You may develop:  Dark, velvety markings on the skin that usually appear on the face, neck, armpits, inner thighs, and groin (acanthosis nigricans). This typically affects people of African-American and American-Indian descent.  Red, raised, scar-like tissue that may itch, feel painful, or develop into a wound (necrobiosis lipoidica).  Blisters on feet, toes, hands, or fingers.  Thickened, wax-like areas of skin that usually occur  on the hands, forehead, or toes (digital sclerosis).  Brown or red ring-shaped or half-ring-shaped patches of skin on the ears or fingers (disseminated granuloma).  Pea-shaped yellow bumps that may be itchy and surrounded by a red ring (eruptive xanthomatosis). This usually affects the arms, feet, buttocks, and the top of the hands.  Round, discolored patches of tan skin that do not hurt or itch (diabetic dermopathy). These may look like age spots.  What do I need to know about itchy skin? It is common for people with diabetes to have itchy skin caused by dryness. Frequent high blood glucose levels can cause itchiness, and poor circulation and certain skin infections can make dry,  itchy skin worse. If you have itchy skin that is red or covered in a rash, this could be a sign of an allergic reaction to a medicine. If you have a rash or if your skin is very itchy, contact your health care provider. You may need help to manage your diabetes better, or you may need treatment for an infection. How can I prevent skin breakdown? When you have diabetes and you get a badly infected ulcer or sore that does not heal, your skin can break down, especially if you have poor circulation or are on bed rest. To prevent skin breakdown:  Keep your skin clean and dry. Wash your skin often. Do not use hot water.  Do not use any products that contain nicotine or tobacco, such as cigarettes and e-cigarettes. Smoking affects the body's ability to heal. If you need help quitting, ask your health care provider.  Check your skin every day for cuts, bruises, redness, blisters, or sores, especially on your feet. Tell your health care provider about any cuts, wounds, or sores you have, especially if they are healing slowly.  If you are on bed rest, try to change positions often.  What else do I need to know about taking care of my skin?   To relieve dry skin and itching: ? Limit baths and showers to 5-10 minutes. ? Bathe with lukewarm water instead of hot water. ? Use mild soap and gentle skin cleansers. Do not use soap that is perfumed or harsh or dries your skin. ? Put on lotion as soon as you finish bathing.  Make sure that your health care provider performs a visual foot exam at every medical visit.  Schedule a foot exam with your health care provider once every year. This exam includes an inspection of the structure and skin of your feet.  If you get a skin injury, such as a cut, blister, or sore, check the area every day for signs of infection. Check for: ? More redness, swelling, or pain. ? More fluid or blood. ? Warmth. ? Pus or a bad smell. Contact a health care provider if:  You  develop a cut or sore, especially on your feet.  You develop signs of infection after a skin injury.  Your blood glucose level is higher than 240 mg/dL (13.3 mmol/L) for 2 days in a row.  You have itchy skin that develops redness or a rash.  You have discolored areas of skin.  You have areas where your skin is changing, such as thickening or appearing shiny. This information is not intended to replace advice given to you by your health care provider. Make sure you discuss any questions you have with your health care provider. Document Released: 05/23/2016 Document Revised: 06/30/2016 Document Reviewed: 05/23/2016 Elsevier Interactive Patient Education  2018  Elsevier Inc[employer]   Take Doxycycline as directed. Keep wound dry, clean, covered. Referral to Podiatrist placed. If symptoms persist after antibiotic completed, then please call clinic. FEEL BETTER!

## 2018-01-09 NOTE — Progress Notes (Signed)
Carelink Summary Report / Loop Recorder 

## 2018-01-10 ENCOUNTER — Ambulatory Visit (INDEPENDENT_AMBULATORY_CARE_PROVIDER_SITE_OTHER): Payer: PPO

## 2018-01-10 ENCOUNTER — Ambulatory Visit: Payer: PPO | Admitting: Podiatry

## 2018-01-10 ENCOUNTER — Encounter: Payer: Self-pay | Admitting: Podiatry

## 2018-01-10 DIAGNOSIS — L03032 Cellulitis of left toe: Secondary | ICD-10-CM

## 2018-01-10 DIAGNOSIS — S99922A Unspecified injury of left foot, initial encounter: Secondary | ICD-10-CM

## 2018-01-10 NOTE — Patient Instructions (Signed)

## 2018-01-10 NOTE — Progress Notes (Signed)
Subjective:   Patient ID: [name], male   DOB: 67 y.o.   MRN: [id number]   HPI Patient presents stating that he thinks he traumatized his left big toe and had a possible puncture injury even though he never saw blood dorsal trauma to his tennis shoe.  He saw his family physician who has placed him on antibiotic and he also has problems with his ingrown toenails of his lateral borders of both feet.  Patient does still smoke a pack and 1/2/day and likes to be active   ROS      Objective:  Physical Exam  Neurovascular status was found to be intact with multiple varicosities in the ankle region which are normal bilateral left hallux on the plantar surface there is a small area on the distal portion that has slight irritation with redness and discomfort in the lateral border the left hallux is irritated with slight redness localized in nature.  No proximal edema erythema or drainage is noted     Assessment:  Possibility for small abscess of the distal aspect of the left hallux plantar with an abscess also the left big toe lateral border hallux nail     Plan:  H&P x-ray reviewed and education concerning condition rendered the patient.  Today I anesthetized the hallux with 60 mg like Marcaine mixture and under sterile conditions I opened up the plantar surface and did find a small little abscess which I flushed out and cleaned and found there is no subtenons exposure no drainage nothing for me to culture.  I then remove the lateral border of the left hallux and clean this area out for 2 separate removals and applied sterile dressing to the toe.  I gave instructions on soaks and bandage usage and gave strict instructions on keeping it clean and continue antibiotic.  If any issues were to occur patient is to reappoint [place] immediately and if the redness should occur into his foot he is to go to the emergency room state  X-ray was negative for signs of ostial lysis or any bone type changes

## 2018-01-15 LAB — CUP PACEART REMOTE DEVICE CHECK
Implantable Pulse Generator Implant Date: 20170821
MDC IDC SESS DTM: 20190114194154

## 2018-01-17 ENCOUNTER — Encounter: Payer: Self-pay | Admitting: Cardiovascular Disease

## 2018-01-17 ENCOUNTER — Ambulatory Visit: Payer: PPO | Admitting: Cardiovascular Disease

## 2018-01-17 VITALS — BP 114/70 | HR 77 | Ht 68.5 in | Wt 233.0 lb

## 2018-01-17 DIAGNOSIS — I2510 Atherosclerotic heart disease of native coronary artery without angina pectoris: Secondary | ICD-10-CM | POA: Diagnosis not present

## 2018-01-17 DIAGNOSIS — E1169 Type 2 diabetes mellitus with other specified complication: Secondary | ICD-10-CM

## 2018-01-17 DIAGNOSIS — Z720 Tobacco use: Secondary | ICD-10-CM

## 2018-01-17 DIAGNOSIS — I480 Paroxysmal atrial fibrillation: Secondary | ICD-10-CM

## 2018-01-17 DIAGNOSIS — I872 Venous insufficiency (chronic) (peripheral): Secondary | ICD-10-CM

## 2018-01-17 DIAGNOSIS — E782 Mixed hyperlipidemia: Secondary | ICD-10-CM

## 2018-01-17 DIAGNOSIS — E669 Obesity, unspecified: Secondary | ICD-10-CM | POA: Diagnosis not present

## 2018-01-17 DIAGNOSIS — I10 Essential (primary) hypertension: Secondary | ICD-10-CM

## 2018-01-17 DIAGNOSIS — Z6834 Body mass index (BMI) 34.0-34.9, adult: Secondary | ICD-10-CM | POA: Diagnosis not present

## 2018-01-17 DIAGNOSIS — Z7901 Long term (current) use of anticoagulants: Secondary | ICD-10-CM | POA: Diagnosis not present

## 2018-01-17 DIAGNOSIS — E6609 Other obesity due to excess calories: Secondary | ICD-10-CM | POA: Diagnosis not present

## 2018-01-17 DIAGNOSIS — Z4509 Encounter for adjustment and management of other cardiac device: Secondary | ICD-10-CM | POA: Diagnosis not present

## 2018-01-17 MED ORDER — LISINOPRIL 5 MG PO TABS: 2.5000 mg | ORAL_TABLET | Freq: Every day | ORAL | 3 refills | Status: DC

## 2018-01-17 MED ORDER — ATORVASTATIN CALCIUM 40 MG PO TABS: 40.0000 mg | ORAL_TABLET | Freq: Every day | ORAL | 3 refills | Status: DC

## 2018-01-17 MED ORDER — SILODOSIN 4 MG PO CAPS: 4.0000 mg | ORAL_CAPSULE | Freq: Every day | ORAL | 5 refills | Status: DC

## 2018-01-17 MED ORDER — APIXABAN 5 MG PO TABS: 5.0000 mg | ORAL_TABLET | Freq: Two times a day (BID) | ORAL | 1 refills | Status: DC

## 2018-01-17 NOTE — Patient Instructions (Signed)
Dr Sallyanne Kuster recommends that you schedule a follow-up appointment in 12 months. You will receive a reminder letter in the mail two months in advance. If you don't receive a letter, please call our office to schedule the follow-up appointment.  If you need a refill on your cardiac medications before your next appointment, please call your pharmacy.   Medication changes made today due to insurance coverage: >>Lisinopril 5 mg tablets - take 0.5 tablet (2.5 mg total) by mouth daily >>Tamsulosin was changed to Rapaflo 4 mg - take 1 tablet daily

## 2018-01-17 NOTE — Progress Notes (Signed)
Cardiology Office Note    Date:  01/17/2018   ID:  [name], DOB [birth year], MRN [id number]  PCP:  Esaw Grandchild, NP  Cardiologist:   Sanda Klein, MD   chief complaint: Atrial fibrillation follow-up  History of Present Illness:  [name] is a 67 y.o. male with coronary artery disease, CABG 4 in 2009, prior inferior wall infarction, hypertension, hyperlipidemia, type 2 diabetes mellitus, venous insufficiency of the lower extremities, previous right frontal cortical infarct, asymptomatic atrial fibrillation detected by implantable loop recorder.  He has not had any new neurological events, but his loop recorder has showed occasional brief episodes of atrial fibrillation with RVR. Most recently had a 36 minute episode the day after Christmas. This was asymptomatic again.  He reports that he is continuing to cut back on his smoking, although he is still smoking at least a half a pack a day. He thinks the Wellbutrin has really helped.  He denies falls or bleeding problems. He has not had problems with exertional angina, shortness of breath at rest or with activity, palpitations, dizziness, syncope, falls or injuries. He did step on a nail while cleaning up his father's workshop and had "a big hole in his toe". He saw Dr. Paulla Dolly who performed debridement, recommended wound care and prescribed doxycycline which she is still taking. He has not had fever or signs of cellulitis. He believes the wound is healing.  He has chronic lower extremity edema and hyperpigmentation as well as varicose veins, not changed recently.   He has been running back and forth to the hospital taking care of his mother who has had some lumbar spine problems. He has run out of multiple medications including lisinopril, farxiga, atorvastatin and Flomax.  TEE performed August 21 did not show clear evidence of an embolic source, although there was late arrival of saline contrast and left atrium felt to be  secondary to pulmonary shunt. LVEF was 45-50% with inferior wall hypokinesis and there was mild mitral regurgitation. A loop recorder was implanted the same day (Dr. Cristopher Peru). Carotid duplex ultrasound showed only mild plaque in the bilateral internal carotid arteries and was antegrade flow in the vertebral arteries.   Past Medical History:  Diagnosis Date  . Chronic venous insufficiency LOWER EXTREMITIES  . Coronary artery disease CARDIOLOGIST - DR  PZWCHENI-  LAST 1 WK AGO -- WILL REQUEST NOTE AND STRESS TEST  . Diabetes mellitus without complication ([place])   . Hyperglycemia   . Hyperlipidemia   . Hypertension   . Mixed dyslipidemia   . Myocardial infarction ([place])   . Neuropathy   . Nocturia   . Prostate cancer ([place]) 08/18/11   gleason 8, volume 24.4cc  . S/P CABG x 4   . ST elevation MI (STEMI) ([place]) 02-10-2008   S/P CABG  . Stroke ([place])   . Urinary hesitancy   . Vision abnormalities     Past Surgical History:  Procedure Laterality Date  . CARDIAC CATHETERIZATION  05/01/2012   grafts widely patent  . CARDIOVASCULAR STRESS TEST  03-15-2010   INFERIOR WALL SCAR WITHOUT ANY MEANINGFUL ISCHEMIA/ EF 46% / LOW RISK SCAN  . CORONARY ARTERY BYPASS GRAFT  02-10-2008  DR Einar Gip   X4 VESSEL DISEASE / [facility] CABG  . CYSTOSCOPY  02/08/2012   Procedure: CYSTOSCOPY FLEXIBLE;  Surgeon: Franchot Gallo, MD;  Location: [facility];  Service: Urology;;  . EP IMPLANTABLE DEVICE N/A 08/14/2016   Procedure: Loop Recorder Insertion;  Surgeon: Evans Lance, MD;  Location: [facility];  Service: Cardiovascular;  Laterality: N/A;  . KNEE SURGERY  age 31   RIGHT  . LEFT HEART CATHETERIZATION WITH CORONARY/GRAFT ANGIOGRAM N/A 05/01/2012   Procedure: LEFT HEART CATHETERIZATION WITH Beatrix Fetters;  Surgeon: Sanda Klein, MD;  Location: [facility];  Service: Cardiovascular;  Laterality: N/A;  . MULTIPLE TEETH EXTRACTIONS (23)/ FOUR QUADRANT ALVEOLPLASTY/ MANDIBULAR  LATERAL EXOSTOSES REDUCTIONS  03-24-2010   CHRONIC PERIODONTITIS  . RADIOACTIVE SEED IMPLANT  02/08/2012   Procedure: RADIOACTIVE SEED IMPLANT;  Surgeon: Franchot Gallo, MD;  Location: [facility];  Service: Urology;  Laterality: N/A;  C-ARM   . SHOULDER SURGERY  1992   LEFT  . TEE WITHOUT CARDIOVERSION N/A 08/14/2016   Procedure: TRANSESOPHAGEAL ECHOCARDIOGRAM (TEE);  Surgeon: Josue Hector, MD;  Location: [facility];  Service: Cardiovascular;  Laterality: N/A;    Current Medications: Outpatient Medications Prior to Visit  Medication Sig Dispense Refill  . dapagliflozin propanediol (FARXIGA) 5 MG TABS tablet Take 5 mg by mouth daily.    Marland Kitchen doxycycline (VIBRA-TABS) 100 MG tablet Take 1 tablet (100 mg total) by mouth 2 (two) times daily. 20 tablet 0  . gabapentin (NEURONTIN) 300 MG capsule Take 1 tablet every morning and 2 tablets at dinner time. 270 capsule 1  . metFORMIN (GLUCOPHAGE) 500 MG tablet Take 1 tablet (500 mg total) by mouth 2 (two) times daily with a meal. 60 tablet 0  . Multiple Vitamins-Minerals (MULTIVITAMIN ADULT PO) Take 1 tablet by mouth daily.     Marland Kitchen apixaban (ELIQUIS) 5 MG TABS tablet Take 1 tablet (5 mg total) by mouth 2 (two) times daily. (Patient taking differently: Take 5 mg by mouth daily. ) 180 tablet 1  . lisinopril (PRINIVIL,ZESTRIL) 2.5 MG tablet Take 1 tablet (2.5 mg total) by mouth daily. 90 tablet 3  . buPROPion (WELLBUTRIN XL) 150 MG 24 hr tablet One tablet daily for three days, then increase to one tablet twice daily (Patient not taking: Reported on 01/17/2018) 180 tablet 0  . atorvastatin (LIPITOR) 40 MG tablet TAKE ONE TABLET BY MOUTH ONCE DAILY AT 6 PM (Patient not taking: Reported on 01/17/2018) 90 tablet 1  . tamsulosin (FLOMAX) 0.4 MG CAPS capsule Take 1 capsule (0.4 mg total) by mouth at bedtime. (Patient not taking: Reported on 01/17/2018) 90 capsule 1   No facility-administered medications prior to visit.      Allergies:   Bee  venom; Oysters [shellfish allergy]; and Penicillins   Social History   Socioeconomic History  . Marital status: Married    Spouse name: None  . Number of children: None  . Years of education: None  . Highest education level: None  Social Needs  . Financial resource strain: None  . Food insecurity - worry: None  . Food insecurity - inability: None  . Transportation needs - medical: None  . Transportation needs - non-medical: None  Occupational History  . None  Tobacco Use  . Smoking status: Current Every Day Smoker    Packs/day: 1.50    Years: 50.00    Pack years: 75.00    Types: Cigarettes  . Smokeless tobacco: Never Used  . Tobacco comment: PREVIOUSLY SMOKED 3PPD / DECREASED TO 1PPD SINCE 2009  Substance and Sexual Activity  . Alcohol use: Yes    Alcohol/week: 1.2 oz    Types: 1 Cans of beer, 1 Shots of liquor per week    Comment: occassional  . Drug use:  No  . Sexual activity: No  Other Topics Concern  . None  Social History Narrative  . None     Family History:  The patient's family history includes Cancer in his father; Diabetes in his father.   ROS:   Please see the history of present illness.    ROS All other systems reviewed and are negative.   PHYSICAL EXAM:   VS:  BP 114/70 (BP Location: Left Arm, Patient Position: Sitting, Cuff Size: Large)   Pulse 77   Ht 5' 8.5" (1.74 m)   Wt 233 lb (105.7 kg)   BMI 34.91 kg/m     General: Alert, oriented x3, no distress, moderately obese Head: no evidence of trauma, PERRL, EOMI, no exophtalmos or lid lag, no myxedema, no xanthelasma; normal ears, nose and oropharynx Neck: normal jugular venous pulsations and no hepatojugular reflux; brisk carotid pulses without delay and no carotid bruits Chest: clear to auscultation, no signs of consolidation by percussion or palpation, normal fremitus, symmetrical and full respiratory excursions. Well-healed loop recorder site Cardiovascular: normal position and quality of the  apical impulse, regular rhythm, normal first and second heart sounds, no murmurs, rubs or gallops Abdomen: no tenderness or distention, no masses by palpation, no abnormal pulsatility or arterial bruits, normal bowel sounds, no hepatosplenomegaly Extremities: no clubbing, cyanosis or edema; 2+ radial, ulnar and brachial pulses bilaterally; 2+ right femoral, posterior tibial and dorsalis pedis pulses; 2+ left femoral, posterior tibial and dorsalis pedis pulses; no subclavian or femoral bruits Neurological: grossly nonfocal Psych: Normal mood and affect   Wt Readings from Last 3 Encounters:  01/17/18 233 lb (105.7 kg)  01/07/18 232 lb 1.6 oz (105.3 kg)  11/27/17 229 lb 9.6 oz (104.1 kg)      Studies/Labs Reviewed:   EKG:  EKG ordered today shows sinus rhythm, pre-existing Q waves in the inferior leads as well as V5, V6, chronic T-wave inversion in 1 and aVL, QTC 423 ms  Recent Labs: No results found for requested labs within last 8760 hours.   Lipid Panel    Component Value Date/Time   CHOL 161 08/12/2016 0048   TRIG 332 (H) 08/12/2016 0048   HDL 24 (L) 08/12/2016 0048   CHOLHDL 6.7 08/12/2016 0048   VLDL 66 (H) 08/12/2016 0048   LDLCALC 71 08/12/2016 0048     ASSESSMENT:    1. Paroxysmal atrial fibrillation (HCC)   2. Long term current use of anticoagulant   3. Coronary artery disease involving native coronary artery of native heart without angina pectoris   4. Essential hypertension   5. Mixed hyperlipidemia   6. Diabetes mellitus type 2 in obese ([place])   7. Tobacco abuse   8. Class 1 obesity due to excess calories with serious comorbidity and body mass index (BMI) of 34.0 to 34.9 in adult   9. Encounter for loop recorder check   10. Venous insufficiency of both lower extremities      PLAN:  In order of problems listed above:    1. AFib: This is asymptomatic and infrequent and I don't think he needs additional rate control or antiarrhythmic medication. On the other  hand he has very high risk of recurrent embolic events.. CHADSVasc 5 (CVA, HTN, DM, CAD).  2. Eliquis: well-tolerated, no bleeding, continue. 3. CAD: Asymptomatic, angina free. The focus is on aggressive risk factor modification.  4. HTN: Has not had anymore symptoms of hypotension since reduced his dose of lisinopril. 5. HLP: His LDL was well controlled,  but he still had a low HDL and high triglycerides will not improve unless he loses a lot of weight and becomes more physically very active. We will not recheck his lipid profile since his been off the statin for a while. 6. DM: improving. His last hemoglobin A1c was 7.7%, closer to target.Marland Kitchen He has bilateral lower extremity neuropathy. Discussed the need for very careful foot care. He should report any worsening of his toe wound, evidence of infection, fever. 7. Tobacco abuse: although he believes that he has made progress with smoking cessation objectively I don't think this is the case. Encourage him to set a date for permanent smoking cessation in the near future. 8. Obesity: Discussed the need for caloric restriction. We'll also need to increase physical activity in the long run, limited by his family obligations and current problems with his toe. 9. ILR:  Normal device function. Episodes of atrial fibrillation are brief and not particularly frequent, but occur at least several times a year. 10. Venous insufficiency of the lower extremities: Chronic hyperpigmentation and minimal edema. Keep legs elevated.    Medication Adjustments/Labs and Tests Ordered: Current medicines are reviewed at length with the patient today.  Concerns regarding medicines are outlined above.  Medication changes, Labs and Tests ordered today are listed in the Patient Instructions below. Patient Instructions  Dr Sallyanne Kuster recommends that you schedule a follow-up appointment in 12 months. You will receive a reminder letter in the mail two months in advance. If you don't  receive a letter, please call our office to schedule the follow-up appointment.  If you need a refill on your cardiac medications before your next appointment, please call your pharmacy.   Medication changes made today due to insurance coverage: >>Lisinopril 5 mg tablets - take 0.5 tablet (2.5 mg total) by mouth daily >>Tamsulosin was changed to Rapaflo 4 mg - take 1 tablet daily    Signed, Sanda Klein, MD  01/17/2018 3:21 PM    [facility] [address], [place], [place]  [address] Phone: [phone]; Fax: [phone]

## 2018-01-21 ENCOUNTER — Encounter: Payer: Self-pay | Admitting: Adult Health

## 2018-01-21 ENCOUNTER — Ambulatory Visit (INDEPENDENT_AMBULATORY_CARE_PROVIDER_SITE_OTHER): Payer: PPO | Admitting: Adult Health

## 2018-01-21 ENCOUNTER — Ambulatory Visit: Payer: BLUE CROSS/BLUE SHIELD | Admitting: Podiatry

## 2018-01-21 ENCOUNTER — Ambulatory Visit: Payer: PPO

## 2018-01-21 VITALS — BP 123/75 | HR 70 | Ht 68.5 in | Wt 235.1 lb

## 2018-01-21 DIAGNOSIS — M79674 Pain in right toe(s): Secondary | ICD-10-CM | POA: Diagnosis not present

## 2018-01-21 MED ORDER — SULFAMETHOXAZOLE-TRIMETHOPRIM 800-160 MG PO TABS: 1.0000 | ORAL_TABLET | Freq: Two times a day (BID) | ORAL | 0 refills | Status: DC

## 2018-01-21 NOTE — Assessment & Plan Note (Signed)
Xray He completed course of Doxycycline last week. Started on Bactrim 800/160mg  BID, 10 days. Please follow diabetic foot care instructions and be very careful to prevent moisture in between the toes. If pain, swelling does not resolve after you complete antibiotic then please call clinic and make appt. If you have any complications with left foot wound- please call your Podiatrist. Please call with any questions/concerns.

## 2018-01-21 NOTE — Progress Notes (Addendum)
Subjective:    Patient ID: [name], male    DOB: March 14, 1951, 67 y.o.   MRN: [id number]  HPI:  Mr. Haskew presents with R 4th toe pain that started >1 week ago. Pain localized over 4th toe on R foot.  Pain 5/10 at rest, 9/10 with palpation/walking/standing. He reports walking "a lot more than my usual the since my momma was in hospital", she was hospitalized 01/12/18-01/17/18.  He also states "I think I may have stubbed my toe" last week.  He was treated by Podiatrist 01/10/18 for L hallus plantar abscess.  He has been performing foot soaks per Podiatrist instructions, f/u PRN He complete course of Doxycycline 100mg  BID, 10 days last week. He denies fever/night sweats/poor appetite/maliase. He denies wearing open toed shoes. He reports applying  "Diabetic Lotion" on feet nightly, then covering with socks. Discussed at length the need to stop tobacco use and reduce weight to improve glycemic control  Lab Results  Component Value Date   HGBA1C 7.7 11/27/2017   HGBA1C 10.6 (H) 08/12/2016   HGBA1C (H) 02/14/2008    6.2 (NOTE)   The ADA recommends the following therapeutic goals for glycemic   control related to Hgb A1C measurement:   Goal of Therapy:   < 7.0% Hgb A1C   Action Suggested:  > 8.0% Hgb A1C   Ref:  Diabetes Care, 16, Suppl. 1, 1999    Patient Care Team    Relationship Specialty Notifications Start End  Esaw Grandchild, NP PCP - General Family Medicine  11/27/17   Sanda Klein, MD PCP - Cardiology Cardiology Admissions 01/16/18     Patient Active Problem List   Diagnosis Date Noted  . Toe pain, right 01/21/2018  . Puncture wound of toe of left foot 01/07/2018  . At risk for diabetic foot ulcer 01/07/2018  . Need for pneumococcal vaccination 11/27/2017  . Screening for colon cancer 11/27/2017  . Healthcare maintenance 11/27/2017  . Paroxysmal atrial fibrillation ([place]) 11/22/2016  . History of embolic stroke 56/43/3295  . Diabetes mellitus type 2 in obese ([place])  11/22/2016  . Venous insufficiency of both lower extremities 09/21/2016  . Stroke ([place]) 08/12/2016  . Hyperglycemia   . Essential hypertension   . Vision, loss, sudden   . CVA (cerebral infarction) 08/11/2016  . CAD - inferior MI 2009, CABG 2009, patent grafts cath 04/2012 09/23/2013  . Obesity 09/23/2013  . Hyperlipidemia 09/23/2013  . Tobacco abuse 09/23/2013  . Prostate cancer ([place]) 08/18/2011  . Myocardial infarction [facility])      Past Medical History:  Diagnosis Date  . Chronic venous insufficiency LOWER EXTREMITIES  . Coronary artery disease CARDIOLOGIST - DR  JOACZYSA-  LAST 1 WK AGO -- WILL REQUEST NOTE AND STRESS TEST  . Diabetes mellitus without complication ([place])   . Hyperglycemia   . Hyperlipidemia   . Hypertension   . Mixed dyslipidemia   . Myocardial infarction ([place])   . Neuropathy   . Nocturia   . Prostate cancer ([place]) 08/18/11   gleason 8, volume 24.4cc  . S/P CABG x 4   . ST elevation MI (STEMI) ([place]) 02-10-2008   S/P CABG  . Stroke ([place])   . Urinary hesitancy   . Vision abnormalities      Past Surgical History:  Procedure Laterality Date  . CARDIAC CATHETERIZATION  05/01/2012   grafts widely patent  . CARDIOVASCULAR STRESS TEST  03-15-2010   INFERIOR WALL SCAR WITHOUT ANY MEANINGFUL ISCHEMIA/ EF 46% / LOW RISK  SCAN  . CORONARY ARTERY BYPASS GRAFT  02-10-2008  DR Einar Gip   X4 VESSEL DISEASE / [facility] CABG  . CYSTOSCOPY  02/08/2012   Procedure: CYSTOSCOPY FLEXIBLE;  Surgeon: Franchot Gallo, MD;  Location: [facility];  Service: Urology;;  . EP IMPLANTABLE DEVICE N/A 08/14/2016   Procedure: Loop Recorder Insertion;  Surgeon: Evans Lance, MD;  Location: [facility] CV LAB;  Service: Cardiovascular;  Laterality: N/A;  . KNEE SURGERY  age 21   RIGHT  . LEFT HEART CATHETERIZATION WITH CORONARY/GRAFT ANGIOGRAM N/A 05/01/2012   Procedure: LEFT HEART CATHETERIZATION WITH Beatrix Fetters;  Surgeon: Sanda Klein, MD;  Location: [facility] CATH  LAB;  Service: Cardiovascular;  Laterality: N/A;  . MULTIPLE TEETH EXTRACTIONS (23)/ FOUR QUADRANT ALVEOLPLASTY/ MANDIBULAR LATERAL EXOSTOSES REDUCTIONS  03-24-2010   CHRONIC PERIODONTITIS  . RADIOACTIVE SEED IMPLANT  02/08/2012   Procedure: RADIOACTIVE SEED IMPLANT;  Surgeon: Franchot Gallo, MD;  Location: [facility];  Service: Urology;  Laterality: N/A;  C-ARM   . SHOULDER SURGERY  1992   LEFT  . TEE WITHOUT CARDIOVERSION N/A 08/14/2016   Procedure: TRANSESOPHAGEAL ECHOCARDIOGRAM (TEE);  Surgeon: Josue Hector, MD;  Location: [facility] ENDOSCOPY;  Service: Cardiovascular;  Laterality: N/A;     Family History  Problem Relation Age of Onset  . Cancer Father        pancreatic  . Diabetes Father      Social History   Substance and Sexual Activity  Drug Use No     Social History   Substance and Sexual Activity  Alcohol Use Yes  . Alcohol/week: 1.2 oz  . Types: 1 Cans of beer, 1 Shots of liquor per week   Comment: occassional     Social History   Tobacco Use  Smoking Status Current Every Day Smoker  . Packs/day: 1.50  . Years: 50.00  . Pack years: 75.00  . Types: Cigarettes  Smokeless Tobacco Never Used  Tobacco Comment   PREVIOUSLY SMOKED 3PPD / DECREASED TO 1PPD SINCE 2009     Outpatient Encounter Medications as of 01/21/2018  Medication Sig  . apixaban (ELIQUIS) 5 MG TABS tablet Take 1 tablet (5 mg total) by mouth 2 (two) times daily.  Marland Kitchen atorvastatin (LIPITOR) 40 MG tablet Take 1 tablet (40 mg total) by mouth daily at 6 PM.  . buPROPion (WELLBUTRIN XL) 150 MG 24 hr tablet One tablet daily for three days, then increase to one tablet twice daily  . dapagliflozin propanediol (FARXIGA) 5 MG TABS tablet Take 5 mg by mouth daily.  Marland Kitchen gabapentin (NEURONTIN) 300 MG capsule Take 1 tablet every morning and 2 tablets at dinner time.  Marland Kitchen lisinopril (PRINIVIL,ZESTRIL) 5 MG tablet Take 0.5 tablets (2.5 mg total) by mouth daily.  . metFORMIN (GLUCOPHAGE) 500 MG  tablet Take 1 tablet (500 mg total) by mouth 2 (two) times daily with a meal.  . Multiple Vitamins-Minerals (MULTIVITAMIN ADULT PO) Take 1 tablet by mouth daily.   . silodosin (RAPAFLO) 4 MG CAPS capsule Take 1 capsule (4 mg total) by mouth daily with breakfast.  . sulfamethoxazole-trimethoprim (BACTRIM DS,SEPTRA DS) 800-160 MG tablet Take 1 tablet by mouth 2 (two) times daily.  . tamsulosin (FLOMAX) 0.4 MG CAPS capsule Take 1 capsule by mouth daily.  . [DISCONTINUED] doxycycline (VIBRA-TABS) 100 MG tablet Take 1 tablet (100 mg total) by mouth 2 (two) times daily.   No facility-administered encounter medications on file as of 01/21/2018.     Allergies: Bee venom; Oysters [shellfish allergy];  and Penicillins  Body mass index is 35.23 kg/m.  Blood pressure 123/75, pulse 70, height 5' 8.5" (1.74 m), weight 235 lb 1.6 oz (106.6 kg), SpO2 98 %.  Review of Systems  Constitutional: Positive for activity change and fatigue. Negative for appetite change, chills, diaphoresis, fever and unexpected weight change.  Respiratory: Negative for cough, chest tightness, shortness of breath, wheezing and stridor.   Cardiovascular: Negative for chest pain, palpitations and leg swelling.  Musculoskeletal: Positive for gait problem and myalgias.  Skin: Positive for wound. Negative for color change, pallor and rash.  Neurological: Negative for dizziness and headaches.  Hematological: Does not bruise/bleed easily.       Objective:   Physical Exam  Constitutional: He is oriented to person, place, and time. He appears well-developed and well-nourished. No distress.  HENT:  Head: Normocephalic and atraumatic.  Right Ear: External ear normal.  Left Ear: External ear normal.  Cardiovascular: Normal rate, regular rhythm, normal heart sounds and intact distal pulses.  No murmur heard. Pulmonary/Chest: Effort normal and breath sounds normal. No respiratory distress. He has no wheezes. He has no rales. He  exhibits no tenderness.  Musculoskeletal: He exhibits edema and tenderness.       Right foot: There is tenderness and swelling. There is normal capillary refill.  4th Toe- TTP, mild redness and warmth noted. Open tissue noted between R foot toes 3/4, with white slough and scant clear drainage No streaking noted  Neurological: He is alert and oriented to person, place, and time.  Skin: Skin is warm and dry. No rash noted. He is not diaphoretic. There is erythema. No pallor.  Psychiatric: He has a normal mood and affect. His behavior is normal. Judgment and thought content normal.  Nursing note and vitals reviewed.         Assessment & Plan:   1. Toe pain, right     Toe pain, right Xray He completed course of Doxycycline last week. Started on Bactrim 800/160mg  BID, 10 days. Please follow diabetic foot care instructions and be very careful to prevent moisture in between the toes. If pain, swelling does not resolve after you complete antibiotic then please call clinic and make appt. If you have any complications with left foot wound- please call your Podiatrist. Please call with any questions/concerns.  Pt was in the office today for 25+ minutes, with over 50% time spent in face to face counseling of patient's various medical conditions and in coordination of care  FOLLOW-UP:  Return if symptoms worsen or fail to improve.

## 2018-01-21 NOTE — Patient Instructions (Signed)
Diabetes Mellitus and Skin Care Diabetes (diabetes mellitus) can lead to health problems over time, including skin problems. People with diabetes have a higher risk for many types of skin complications. This is because having poorly controlled blood sugar (glucose) levels can:  Damage nerves and blood vessels. This can result in decreased feeling in your legs and feet, which means you may not notice minor skin injuries that could lead to serious problems.  Reduce blood flow (circulation), which makes wounds heal more slowly and increases your risk of infection.  Cause areas of skin to become thick or discolored.  What are some common skin conditions that affect people with diabetes? Diabetes often causes dry skin. It can also cause the skin on the feet to get thinner, break more easily, and heal more slowly. There are certain skin conditions that commonly affect people who have diabetes, such as:  Bacterial skin infections, such as styes, boils, infected hair follicles, and infections of the skin around the nails.  Fungal skin infections. These are most common in areas where skin rubs together, such as in the armpits or under the breasts.  Open sores, especially on the feet.  Tissue death (gangrene). This can happen on your feet if a serious infection does not heal properly. Gangrene can cause the need for a foot or leg to be surgically removed (amputated).  Diabetes can also cause the skin to change. You may develop:  Dark, velvety markings on the skin that usually appear on the face, neck, armpits, inner thighs, and groin (acanthosis nigricans). This typically affects people of African-American and American-Indian descent.  Red, raised, scar-like tissue that may itch, feel painful, or develop into a wound (necrobiosis lipoidica).  Blisters on feet, toes, hands, or fingers.  Thickened, wax-like areas of skin that usually occur on the hands, forehead, or toes (digital sclerosis).  Brown  or red ring-shaped or half-ring-shaped patches of skin on the ears or fingers (disseminated granuloma).  Pea-shaped yellow bumps that may be itchy and surrounded by a red ring (eruptive xanthomatosis). This usually affects the arms, feet, buttocks, and the top of the hands.  Round, discolored patches of tan skin that do not hurt or itch (diabetic dermopathy). These may look like age spots.  What do I need to know about itchy skin? It is common for people with diabetes to have itchy skin caused by dryness. Frequent high blood glucose levels can cause itchiness, and poor circulation and certain skin infections can make dry, itchy skin worse. If you have itchy skin that is red or covered in a rash, this could be a sign of an allergic reaction to a medicine. If you have a rash or if your skin is very itchy, contact your health care provider. You may need help to manage your diabetes better, or you may need treatment for an infection. How can I prevent skin breakdown? When you have diabetes and you get a badly infected ulcer or sore that does not heal, your skin can break down, especially if you have poor circulation or are on bed rest. To prevent skin breakdown:  Keep your skin clean and dry. Wash your skin often. Do not use hot water.  Do not use any products that contain nicotine or tobacco, such as cigarettes and e-cigarettes. Smoking affects the body's ability to heal. If you need help quitting, ask your health care provider.  Check your skin every day for cuts, bruises, redness, blisters, or sores, especially on your feet. Tell your  health care provider about any cuts, wounds, or sores you have, especially if they are healing slowly.  If you are on bed rest, try to change positions often.  What else do I need to know about taking care of my skin?   To relieve dry skin and itching: ? Limit baths and showers to 5-10 minutes. ? Bathe with lukewarm water instead of hot water. ? Use mild soap  and gentle skin cleansers. Do not use soap that is perfumed or harsh or dries your skin. ? Put on lotion as soon as you finish bathing.  Make sure that your health care provider performs a visual foot exam at every medical visit.  Schedule a foot exam with your health care provider once every year. This exam includes an inspection of the structure and skin of your feet.  If you get a skin injury, such as a cut, blister, or sore, check the area every day for signs of infection. Check for: ? More redness, swelling, or pain. ? More fluid or blood. ? Warmth. ? Pus or a bad smell. Contact a health care provider if:  You develop a cut or sore, especially on your feet.  You develop signs of infection after a skin injury.  Your blood glucose level is higher than 240 mg/dL (13.3 mmol/L) for 2 days in a row.  You have itchy skin that develops redness or a rash.  You have discolored areas of skin.  You have areas where your skin is changing, such as thickening or appearing shiny. This information is not intended to replace advice given to you by your health care provider. Make sure you discuss any questions you have with your health care provider. Document Released: 05/23/2016 Document Revised: 06/30/2016 Document Reviewed: 05/23/2016 Elsevier Interactive Patient Education  2018 [employer].  Please take Bactrim as directed, complete full course. Please follow diabetic foot care instructions and be very careful to prevent moisture in between the toes. If pain, swelling does not resolve after you complete antibiotic then please call clinic and make appt. If you have any complications with left foot wound- please call your Podiatrist. Please call with any questions/concerns. FEEL BETTER!

## 2018-01-30 ENCOUNTER — Encounter: Payer: Self-pay | Admitting: Adult Health

## 2018-01-30 ENCOUNTER — Ambulatory Visit (INDEPENDENT_AMBULATORY_CARE_PROVIDER_SITE_OTHER): Payer: PPO | Admitting: Adult Health

## 2018-01-30 VITALS — BP 124/72 | HR 93 | Ht 68.5 in | Wt 234.5 lb

## 2018-01-30 DIAGNOSIS — R319 Hematuria, unspecified: Secondary | ICD-10-CM

## 2018-01-30 DIAGNOSIS — M79674 Pain in right toe(s): Secondary | ICD-10-CM | POA: Diagnosis not present

## 2018-01-30 DIAGNOSIS — E669 Obesity, unspecified: Secondary | ICD-10-CM | POA: Diagnosis not present

## 2018-01-30 DIAGNOSIS — E1169 Type 2 diabetes mellitus with other specified complication: Secondary | ICD-10-CM | POA: Diagnosis not present

## 2018-01-30 LAB — POCT URINALYSIS DIPSTICK
Bilirubin, UA: NEGATIVE
GLUCOSE UA: 500
KETONES UA: NEGATIVE
LEUKOCYTES UA: NEGATIVE
NITRITE UA: NEGATIVE
Protein, UA: 30
Spec Grav, UA: >=1.03 — AB (ref 1.010–1.025)
Urobilinogen, UA: 1 E.U./dL
pH, UA: 5.5 (ref 5.0–8.0)

## 2018-01-30 MED ORDER — GABAPENTIN 300 MG PO CAPS: ORAL_CAPSULE | ORAL | 1 refills | Status: DC

## 2018-01-30 NOTE — Assessment & Plan Note (Signed)
  1) Please call your podiatrist and let them know that your Left Great Toe has not fully healed and also about the Right 4th toe pain. 2) Urine Analysis- blood, protein, and glucose detected, however you do not have a UTI.  If blood in urine continues, then please call your Urologist. 3) Pain may be related to diabetic neuropathy- adjustment made to Gabapentin 300mg - Take 1 tablet every morning and NOW 1 in the  afternoon. Continue with 2 tablets at dinner time.   4) We will call you when your Uric Acid Level/CMP results are available. 5) Complete current course of antibiotics, and alternate OTC Acetaminophen and Ibuprofen as needed for pain. Please call us with any questions/concerns. Please keep your regular follow-up appt in March.

## 2018-01-30 NOTE — Progress Notes (Signed)
Subjective:    Patient ID: [name], male    DOB: 1951/06/10, 67 y.o.   MRN: [id number]  HPI: 01/21/18 OV:  [name] presents with R 4th toe pain that started >1 week ago. Pain localized over 4th toe on R foot.  Pain 5/10 at rest, 9/10 with palpation/walking/standing. He reports walking "a lot more than my usual the since my momma was in hospital", she was hospitalized 01/12/18-01/17/18.  He also states "I think I may have stubbed my toe" last week.  He was treated by Podiatrist 01/10/18 for L hallus plantar abscess.  He has been performing foot soaks per Podiatrist instructions, f/u PRN He complete course of Doxycycline 100mg  BID, 10 days last week. He denies fever/night sweats/poor appetite/maliase. He denies wearing open toed shoes. He reports applying  "Diabetic Lotion" on feet nightly, then covering with socks. Discussed at length the need to stop tobacco use and reduce weight to improve glycemic control  01/30/18 OV: [name] presents with continued R 4th toe pain- 0/10 at rest, 9/10 with standing/ambulation.  He will complete course of Bactrim tomorrow.  He denies fever/night sweats/malaise/ppor appetite. He denies hx of Gout. He has not followed up with Podiatrist, re:  L hallus plantar abscess- which has not fully healed.    Lab Results  Component Value Date   HGBA1C 7.7 11/27/2017   HGBA1C 10.6 (H) 08/12/2016   HGBA1C (H) 02/14/2008    6.2 (NOTE)   The ADA recommends the following therapeutic goals for glycemic   control related to Hgb A1C measurement:   Goal of Therapy:   < 7.0% Hgb A1C   Action Suggested:  > 8.0% Hgb A1C   Ref:  Diabetes Care, 94, Suppl. 1, 1999    Patient Care Team    Relationship Specialty Notifications Start End  Esaw Grandchild, NP PCP - General Family Medicine  11/27/17   Sanda Klein, MD PCP - Cardiology Cardiology Admissions 01/16/18     Patient Active Problem List   Diagnosis Date Noted  . Hematuria 01/30/2018  . Pain of toe of right  foot 01/21/2018  . Puncture wound of toe of left foot 01/07/2018  . At risk for diabetic foot ulcer 01/07/2018  . Need for pneumococcal vaccination 11/27/2017  . Screening for colon cancer 11/27/2017  . Healthcare maintenance 11/27/2017  . Paroxysmal atrial fibrillation ([place]) 11/22/2016  . History of embolic stroke 40/81/4481  . Diabetes mellitus type 2 in obese ([place]) 11/22/2016  . Venous insufficiency of both lower extremities 09/21/2016  . Stroke ([place]) 08/12/2016  . Hyperglycemia   . Essential hypertension   . Vision, loss, sudden   . CVA (cerebral infarction) 08/11/2016  . CAD - inferior MI 2009, CABG 2009, patent grafts cath 04/2012 09/23/2013  . Obesity 09/23/2013  . Hyperlipidemia 09/23/2013  . Tobacco abuse 09/23/2013  . Prostate cancer ([place]) 08/18/2011  . Myocardial infarction [facility])      Past Medical History:  Diagnosis Date  . Chronic venous insufficiency LOWER EXTREMITIES  . Coronary artery disease CARDIOLOGIST - DR  EHUDJSHF-  LAST 1 WK AGO -- WILL REQUEST NOTE AND STRESS TEST  . Diabetes mellitus without complication ([place])   . Hyperglycemia   . Hyperlipidemia   . Hypertension   . Mixed dyslipidemia   . Myocardial infarction ([place])   . Neuropathy   . Nocturia   . Prostate cancer ([place]) 08/18/11   gleason 8, volume 24.4cc  . S/P CABG x 4   . ST elevation  MI (STEMI) ([place]) 02-10-2008   S/P CABG  . Stroke ([place])   . Urinary hesitancy   . Vision abnormalities      Past Surgical History:  Procedure Laterality Date  . CARDIAC CATHETERIZATION  05/01/2012   grafts widely patent  . CARDIOVASCULAR STRESS TEST  03-15-2010   INFERIOR WALL SCAR WITHOUT ANY MEANINGFUL ISCHEMIA/ EF 46% / LOW RISK SCAN  . CORONARY ARTERY BYPASS GRAFT  02-10-2008  DR Einar Gip   X4 VESSEL DISEASE / [facility] CABG  . CYSTOSCOPY  02/08/2012   Procedure: CYSTOSCOPY FLEXIBLE;  Surgeon: Franchot Gallo, MD;  Location: [facility];  Service: Urology;;  . EP IMPLANTABLE DEVICE N/A  08/14/2016   Procedure: Loop Recorder Insertion;  Surgeon: Evans Lance, MD;  Location: [place] CV LAB;  Service: Cardiovascular;  Laterality: N/A;  . KNEE SURGERY  age 71   RIGHT  . LEFT HEART CATHETERIZATION WITH CORONARY/GRAFT ANGIOGRAM N/A 05/01/2012   Procedure: LEFT HEART CATHETERIZATION WITH Beatrix Fetters;  Surgeon: Sanda Klein, MD;  Location: [place] CATH LAB;  Service: Cardiovascular;  Laterality: N/A;  . MULTIPLE TEETH EXTRACTIONS (23)/ FOUR QUADRANT ALVEOLPLASTY/ MANDIBULAR LATERAL EXOSTOSES REDUCTIONS  03-24-2010   CHRONIC PERIODONTITIS  . RADIOACTIVE SEED IMPLANT  02/08/2012   Procedure: RADIOACTIVE SEED IMPLANT;  Surgeon: Franchot Gallo, MD;  Location: [facility];  Service: Urology;  Laterality: N/A;  C-ARM   . SHOULDER SURGERY  1992   LEFT  . TEE WITHOUT CARDIOVERSION N/A 08/14/2016   Procedure: TRANSESOPHAGEAL ECHOCARDIOGRAM (TEE);  Surgeon: Josue Hector, MD;  Location: [facility] ENDOSCOPY;  Service: Cardiovascular;  Laterality: N/A;     Family History  Problem Relation Age of Onset  . Cancer Father        pancreatic  . Diabetes Father      Social History   Substance and Sexual Activity  Drug Use No     Social History   Substance and Sexual Activity  Alcohol Use Yes  . Alcohol/week: 1.2 oz  . Types: 1 Cans of beer, 1 Shots of liquor per week   Comment: occassional     Social History   Tobacco Use  Smoking Status Current Every Day Smoker  . Packs/day: 1.50  . Years: 50.00  . Pack years: 75.00  . Types: Cigarettes  Smokeless Tobacco Never Used  Tobacco Comment   PREVIOUSLY SMOKED 3PPD / DECREASED TO 1PPD SINCE 2009     Outpatient Encounter Medications as of 01/30/2018  Medication Sig  . apixaban (ELIQUIS) 5 MG TABS tablet Take 1 tablet (5 mg total) by mouth 2 (two) times daily.  Marland Kitchen atorvastatin (LIPITOR) 40 MG tablet Take 1 tablet (40 mg total) by mouth daily at 6 PM.  . buPROPion (WELLBUTRIN XL) 150 MG 24 hr tablet One  tablet daily for three days, then increase to one tablet twice daily  . dapagliflozin propanediol (FARXIGA) 5 MG TABS tablet Take 5 mg by mouth daily.  Marland Kitchen gabapentin (NEURONTIN) 300 MG capsule Take 1 tablet every morning and afternoon. 2 tablets at dinner time.  Marland Kitchen lisinopril (PRINIVIL,ZESTRIL) 5 MG tablet Take 0.5 tablets (2.5 mg total) by mouth daily.  . metFORMIN (GLUCOPHAGE) 500 MG tablet Take 1 tablet (500 mg total) by mouth 2 (two) times daily with a meal.  . Multiple Vitamins-Minerals (MULTIVITAMIN ADULT PO) Take 1 tablet by mouth daily.   . silodosin (RAPAFLO) 4 MG CAPS capsule Take 1 capsule (4 mg total) by mouth daily with breakfast.  . sulfamethoxazole-trimethoprim (BACTRIM DS,SEPTRA DS)  800-160 MG tablet Take 1 tablet by mouth 2 (two) times daily.  . tamsulosin (FLOMAX) 0.4 MG CAPS capsule Take 1 capsule by mouth daily.  . [DISCONTINUED] gabapentin (NEURONTIN) 300 MG capsule Take 1 tablet every morning and 2 tablets at dinner time.   No facility-administered encounter medications on file as of 01/30/2018.     Allergies: Bee venom; Oysters [shellfish allergy]; and Penicillins  Body mass index is 35.14 kg/m.  Blood pressure 124/72, pulse 93, height 5' 8.5" (1.74 m), weight 234 lb 8 oz (106.4 kg), SpO2 100 %.  Review of Systems  Constitutional: Positive for activity change and fatigue. Negative for appetite change, chills, diaphoresis, fever and unexpected weight change.  Respiratory: Negative for cough, chest tightness, shortness of breath, wheezing and stridor.   Cardiovascular: Negative for chest pain, palpitations and leg swelling.  Musculoskeletal: Positive for gait problem and myalgias.  Skin: Positive for wound. Negative for color change, pallor and rash.  Neurological: Negative for dizziness and headaches.  Hematological: Does not bruise/bleed easily.       Objective:   Physical Exam  Constitutional: He is oriented to person, place, and time. He appears well-developed  and well-nourished. No distress.  HENT:  Head: Normocephalic and atraumatic.  Right Ear: External ear normal.  Left Ear: External ear normal.  Cardiovascular: Normal rate, regular rhythm, normal heart sounds and intact distal pulses.  No murmur heard. Pulmonary/Chest: Effort normal and breath sounds normal. No respiratory distress. He has no wheezes. He has no rales. He exhibits no tenderness.  Musculoskeletal: He exhibits tenderness. He exhibits no edema.       Right foot: There is tenderness and swelling. There is normal capillary refill.  R 4th Toe proximal/base of phalange - TTP, mild redness and warmth noted. Tissue between R foot toes healed. No streaking noted.  L great toe- fluctuance at distal phalange.  No open tissue/drainage/streaking noted.  Neurological: He is alert and oriented to person, place, and time.  Skin: Skin is warm and dry. No rash noted. He is not diaphoretic. No erythema. No pallor.  Psychiatric: He has a normal mood and affect. His behavior is normal. Judgment and thought content normal.  Nursing note and vitals reviewed.         Assessment & Plan:   1. Hematuria, unspecified type   2. Pain of toe of right foot   3. Diabetes mellitus type 2 in obese (HCC)     Pain of toe of right foot  1) Please call your podiatrist and let them know that your Left Great Toe has not fully healed and also about the Right 4th toe pain. 2) Urine Analysis- blood, protein, and glucose detected, however you do not have a UTI.  If blood in urine continues, then please call your Urologist. 3) Pain may be related to diabetic neuropathy- adjustment made to Gabapentin 300mg - Take 1 tablet every morning and NOW 1 in the  afternoon. Continue with 2 tablets at dinner time.   4) We will call you when your Uric Acid Level/CMP results are available. 5) Complete current course of antibiotics, and alternate OTC Acetaminophen and Ibuprofen as needed for pain. Please call us with any  questions/concerns. Please keep your regular follow-up appt in March.  Pt was in the office today for 25+ minutes, with over 50% time spent in face to face counseling of patient's various medical conditions and in coordination of care  FOLLOW-UP:  No Follow-up on file.

## 2018-01-30 NOTE — Patient Instructions (Signed)
Diabetic Neuropathy Diabetic neuropathy is a nerve disease or nerve damage that is caused by diabetes mellitus. About half of all people with diabetes mellitus have some form of nerve damage. Nerve damage is more common in those who have had diabetes mellitus for many years and who generally have not had good control of their blood sugar (glucose) level. Diabetic neuropathy is a common complication of diabetes mellitus. There are three common types of diabetic neuropathy and a fourth type that is less common and less understood:  Peripheral neuropathy-This is the most common type of diabetic neuropathy. It causes damage to the nerves of the feet and legs first and then eventually the hands and arms. The damage affects the ability to sense touch.  Autonomic neuropathy-This type causes damage to the autonomic nervous system, which controls the following functions: ? Heartbeat. ? Body temperature. ? Blood pressure. ? Urination. ? Digestion. ? Sweating. ? Sexual function.  Focal neuropathy-Focal neuropathy can be painful and unpredictable and occurs most often in older adults with diabetes mellitus. It involves a specific nerve or one area and often comes on suddenly. It usually does not cause long-term problems.  Radiculoplexus neuropathy- Sometimes called lumbosacral radiculoplexus neuropathy, radiculoplexus neuropathy affects the nerves of the thighs, hips, buttocks, or legs. It is more common in people with type 2 diabetes mellitus and in older men. It is characterized by debilitating pain, weakness, and atrophy, usually in the thigh muscles.  What are the causes? The cause of peripheral, autonomic, and focal neuropathies is diabetes mellitus that is uncontrolled and high glucose levels. The cause of radiculoplexus neuropathy is unknown. However, it is thought to be caused by inflammation related to uncontrolled glucose levels. What are the signs or symptoms? Peripheral Neuropathy Peripheral  neuropathy develops slowly over time. When the nerves of the feet and legs no longer work there may be:  Burning, stabbing, or aching pain in the legs or feet.  Inability to feel pressure or pain in your feet. This can lead to: ? Thick calluses over pressure areas. ? Pressure sores. ? Ulcers.  Foot deformities.  Reduced ability to feel temperature changes.  Muscle weakness.  Autonomic Neuropathy The symptoms of autonomic neuropathy vary depending on which nerves are affected. Symptoms may include:  Problems with digestion, such as: ? Feeling sick to your stomach (nausea). ? Vomiting. ? Bloating. ? Constipation. ? Diarrhea. ? Abdominal pain.  Difficulty with urination. This occurs if you lose your ability to sense when your bladder is full. Problems include: ? Urine leakage (incontinence). ? Inability to empty your bladder completely (retention).  Rapid or irregular heartbeat (palpitations).  Blood pressure drops when you stand up (orthostatic hypotension). When you stand up you may feel: ? Dizzy. ? Weak. ? Faint.  In men, inability to attain and maintain an erection.  In women, vaginal dryness and problems with decreased sexual desire and arousal.  Problems with body temperature regulation.  Increased or decreased sweating.  Focal Neuropathy  Abnormal eye movements or abnormal alignment of both eyes.  Weakness in the wrist.  Foot drop. This results in an inability to lift the foot properly and abnormal walking or foot movement.  Paralysis on one side of your face (Bell palsy).  Chest or abdominal pain. Radiculoplexus Neuropathy  Sudden, severe pain in your hip, thigh, or buttocks.  Weakness and wasting of thigh muscles.  Difficulty rising from a seated position.  Abdominal swelling.  Unexplained weight loss (usually more than 10 lb [4.5 kg]). How is   this diagnosed? Peripheral Neuropathy Your senses may be tested. Sensory function testing can be  done with:  A light touch using a monofilament.  A vibration with tuning fork.  A sharp sensation with a pin prick.  Other tests that can help diagnose neuropathy are:  Nerve conduction velocity. This test checks the transmission of an electrical current through a nerve.  Electromyography. This shows how muscles respond to electrical signals transmitted by nearby nerves.  Quantitative sensory testing. This is used to assess how your nerves respond to vibrations and changes in temperature.  Autonomic Neuropathy Diagnosis is often based on reported symptoms. Tell your health care provider if you experience:  Dizziness.  Constipation.  Diarrhea.  Inappropriate urination or inability to urinate.  Inability to get or maintain an erection.  Tests that may be done include:  Electrocardiography or Holter monitor. These are tests that can help show problems with the heart rate or heart rhythm.  An X-ray exam may be done.  Focal Neuropathy Diagnosis is made based on your symptoms and what your health care provider finds during your exam. Other tests may be done. They may include:  Nerve conduction velocities. This checks the transmission of electrical current through a nerve.  Electromyography. This shows how muscles respond to electrical signals transmitted by nearby nerves.  Quantitative sensory testing. This test is used to assess how your nerves respond to vibration and changes in temperature.  Radiculoplexus Neuropathy  Often the first thing is to eliminate any other issue or problems that might be the cause, as there is no standard test for diagnosis.  X-ray exam of your spine and lumbar region.  Spinal tap to rule out cancer.  MRI to rule out other lesions. How is this treated? Once nerve damage occurs, it cannot be reversed. The goal of treatment is to keep the disease or nerve damage from getting worse and affecting more nerve fibers. Controlling your blood  glucose level is the key. Most people with radiculoplexus neuropathy see at least a partial improvement over time. You will need to keep your blood glucose and HbA1c levels in the target range determined by your health care provider. Things that help control blood glucose levels include:  Blood glucose monitoring.  Meal planning.  Physical activity.  Diabetes medicine.  Over time, maintaining lower blood glucose levels helps lessen symptoms. Sometimes, prescription pain medicine is needed. Follow these instructions at home:  Do not smoke.  Keep your blood glucose level in the range that you and your health care provider have determined acceptable for you.  Keep your blood pressure level in the range that you and your health care provider have determined acceptable for you.  Eat a well-balanced diet.  Be physically active every day. Include strength training and balance exercises.  Protect your feet. ? Check your feet every day for sores, cuts, blisters, or signs of infection. ? Wear padded socks and supportive shoes. Use orthotic inserts, if necessary. ? Regularly check the insides of your shoes for worn spots. Make sure there are no rocks or other items inside your shoes before you put them on. Contact a health care provider if:  You have burning, stabbing, or aching pain in the legs or feet.  You are unable to feel pressure or pain in your feet.  You develop problems with digestion such as: ? Nausea. ? Vomiting. ? Bloating. ? Constipation. ? Diarrhea. ? Abdominal pain.  You have difficulty with urination, such as: ? Incontinence. ? Retention.    You have palpitations.  You develop orthostatic hypotension. When you stand up you may feel: ? Dizzy. ? Weak. ? Faint.  You cannot attain and maintain an erection (in men).  You have vaginal dryness and problems with decreased sexual desire and arousal (in women).  You have severe pain in your thighs, legs, or  buttocks.  You have unexplained weight loss. This information is not intended to replace advice given to you by your health care provider. Make sure you discuss any questions you have with your health care provider. Document Released: 02/19/2002 Document Revised: 05/18/2016 Document Reviewed: 05/22/2013 Elsevier Interactive Patient Education  2017 [place].   1) Please call your podiatrist and let them know that your Left Great Toe has not fully healed and also about the Right 4th toe pain. 2) Urine Analysis- blood, protein, and glucose detected, however you do not have a UTI.  If blood in urine continues, then please call your Urologist. 3) Pain may be related to diabetic neuropathy- adjustment made to Gabapentin 300mg - Take 1 tablet every morning and NOW 1 in the  afternoon. Continue with 2 tablets at dinner time.   4) We will call you when your Uric Acid Level/CMP results are available. 5) Complete current course of antibiotics, and alternate OTC Acetaminophen and Ibuprofen as needed for pain. Please call us with any questions/concerns. Please keep your regular follow-up appt in March. NICE TO SEE TO YOU!

## 2018-01-31 LAB — COMPREHENSIVE METABOLIC PANEL
A/G RATIO: 1.5 (ref 1.2–2.2)
ALBUMIN: 4.5 g/dL (ref 3.6–4.8)
ALK PHOS: 133 IU/L — AB (ref 39–117)
ALT: 24 IU/L (ref 0–44)
AST: 18 IU/L (ref 0–40)
BILIRUBIN TOTAL: 0.3 mg/dL (ref 0.0–1.2)
BUN / CREAT RATIO: 14 (ref 10–24)
BUN: 14 mg/dL (ref 8–27)
CHLORIDE: 98 mmol/L (ref 96–106)
CO2: 20 mmol/L (ref 20–29)
CREATININE: 0.97 mg/dL (ref 0.76–1.27)
Calcium: 9.4 mg/dL (ref 8.6–10.2)
GFR calc Af Amer: 94 mL/min/{1.73_m2} (ref >59)
GFR calc non Af Amer: 82 mL/min/{1.73_m2} (ref >59)
GLOBULIN, TOTAL: 3 g/dL (ref 1.5–4.5)
Glucose: 252 mg/dL — ABNORMAL HIGH (ref 65–99)
POTASSIUM: 4.8 mmol/L (ref 3.5–5.2)
SODIUM: 137 mmol/L (ref 134–144)
Total Protein: 7.5 g/dL (ref 6.0–8.5)

## 2018-01-31 LAB — URIC ACID: URIC ACID: 4.2 mg/dL (ref 3.7–8.6)

## 2018-02-06 ENCOUNTER — Ambulatory Visit (INDEPENDENT_AMBULATORY_CARE_PROVIDER_SITE_OTHER): Payer: PPO | Admitting: *Deleted

## 2018-02-06 DIAGNOSIS — I63411 Cerebral infarction due to embolism of right middle cerebral artery: Secondary | ICD-10-CM | POA: Diagnosis not present

## 2018-02-07 ENCOUNTER — Other Ambulatory Visit: Payer: Self-pay | Admitting: Podiatry

## 2018-02-07 ENCOUNTER — Ambulatory Visit (INDEPENDENT_AMBULATORY_CARE_PROVIDER_SITE_OTHER): Payer: PPO | Admitting: Podiatry

## 2018-02-07 ENCOUNTER — Ambulatory Visit (INDEPENDENT_AMBULATORY_CARE_PROVIDER_SITE_OTHER): Payer: PPO

## 2018-02-07 ENCOUNTER — Encounter: Payer: Self-pay | Admitting: Podiatry

## 2018-02-07 DIAGNOSIS — M79671 Pain in right foot: Secondary | ICD-10-CM

## 2018-02-07 DIAGNOSIS — M779 Enthesopathy, unspecified: Secondary | ICD-10-CM

## 2018-02-07 DIAGNOSIS — L03032 Cellulitis of left toe: Secondary | ICD-10-CM | POA: Diagnosis not present

## 2018-02-07 MED ORDER — TRIAMCINOLONE ACETONIDE 10 MG/ML IJ SUSP
10.0000 mg | Freq: Once | INTRAMUSCULAR | Status: AC
  Administered 2018-02-07: 10 mg

## 2018-02-07 NOTE — Progress Notes (Signed)
Carelink Summary Report / Loop Recorder 

## 2018-02-07 NOTE — Progress Notes (Signed)
Subjective:   Patient ID: [name], male   DOB: 67 y.o.   MRN: [id number]   HPI Patient states the left toe overall seems to be doing pretty well but at times he gets stabbing pains and it and it can wake me up and will just last for a few minutes.  Patient also is complaining of pain in the right third and fourth metatarsals and Sater hard to walk   ROS      Objective:  Physical Exam  Neurovascular status is unchanged from previous visit with discussion there may be some vascular disease left but currently he does not shown any claudication symptoms.  I saw that the lesion is improving there is no current drainage noted is keratotic with pressure but appears to be healing okay and on the right foot there is exquisite inflammation in the third and fourth metatarsal phalangeal joint     Assessment:  Inflammatory capsulitis of the third and fourth MPJs right with distal keratotic lesion hallux left that is healing with no ulceration or breakdown of tissue     Plan:  H&P conditions reviewed debrided lesion left with no breakdown of tissue and injected the capsule right 3 mg Dexasone Kenalog 5 mg Xylocaine into both the third and fourth metatarsal phalangeal joints independently.  I applied padding to the area and patient will be seen back on an as-needed basis

## 2018-02-24 NOTE — Progress Notes (Signed)
Subjective:    Patient ID: [name], male    DOB: 1951/11/04, 67 y.o.   MRN: [id number]  HPI:  11/27/18 OV: [name] is here to establish as a new pt.  He is a pleasant 67 year old male.  PMH: MI with CABG 2009, A fib, CVA in 07/2016, HL, HTN, T2D, nocturia,hx of kidney stones, and tobacco use- 1.5 pack/day for > 50 years- he is interested in smoking cessation- GREAT! He is retired and remains quite active with hobbies- cooking, car restoration, house work, yard [employer], working in [employer].  He follows diabetic diet and drinks coffee, unsweat tea, and occasionally water.  He reports infrequently checking his BS and when he does in the Am it runs 120-160s.  He denies episodes of hypoglycemia.   He is followed by cards/Dr. Sallyanne Kuster He reports sound, restful sleep and denies any acute complaints today. He lives with his 104 year old mother that has dementia.  02/25/18 OV: [name] presents for regular f/u: HTN, HL, T2D, tobacco use disorder, unable to obtain fasting labs today. He reports inconsistent medication compliance, "I might miss my evening dose of Metformin". He has not been checking his BS He was seen twice since last initial OV in  Dec 2018 for L great toe infection/pain and R 4th/5th toes pain He has completed course of doxy and podiatrist completed I/D of L great toe.  Pain is now intermittent with palpation or walking prolonged periods. R toe pain treated with injection of Dexasone/Kenalog/Xylocaine 02/07/18- no pain currently. He has now found skilled home care 0800-1600 for his mother and this has helped decreased care giver strain, mothers has advancing dementia He reports that the Wellbutrin 150mg  BID helped him back down to 3/4 pack/day, but with the acute stress of his mother's decline he is back to pack/day He feels that the Wellbutrin has helped reduce overall anxiety level and would like to continue rx He denies thoughts of harming himself/others    Lab Results   Component Value Date   HGBA1C 8.0 02/25/2018   HGBA1C 7.7 11/27/2017   HGBA1C 10.6 (H) 08/12/2016    Patient Care Team    Relationship Specialty Notifications Start End  Esaw Grandchild, NP PCP - General Family Medicine  11/27/17   Sanda Klein, MD PCP - Cardiology Cardiology Admissions 01/16/18     Patient Active Problem List   Diagnosis Date Noted  . Hematuria 01/30/2018  . Puncture wound of toe of left foot 01/07/2018  . At risk for diabetic foot ulcer 01/07/2018  . Need for pneumococcal vaccination 11/27/2017  . Screening for colon cancer 11/27/2017  . Healthcare maintenance 11/27/2017  . Paroxysmal atrial fibrillation ([place]) 11/22/2016  . History of embolic stroke 69/48/5462  . Diabetes mellitus type 2 in obese ([place]) 11/22/2016  . Venous insufficiency of both lower extremities 09/21/2016  . Stroke ([place]) 08/12/2016  . Hyperglycemia   . Essential hypertension   . Vision, loss, sudden   . CVA (cerebral infarction) 08/11/2016  . CAD - inferior MI 2009, CABG 2009, patent grafts cath 04/2012 09/23/2013  . Obesity 09/23/2013  . Hyperlipidemia 09/23/2013  . Tobacco abuse 09/23/2013  . Prostate cancer ([place]) 08/18/2011  . Myocardial infarction [facility])      Past Medical History:  Diagnosis Date  . Chronic venous insufficiency LOWER EXTREMITIES  . Coronary artery disease CARDIOLOGIST - DR  [id number]-  LAST 1 WK AGO -- WILL REQUEST NOTE AND STRESS TEST  . Diabetes mellitus without complication (  [place])   . Hyperglycemia   . Hyperlipidemia   . Hypertension   . Mixed dyslipidemia   . Myocardial infarction ([place])   . Neuropathy   . Nocturia   . Prostate cancer ([place]) 08/18/11   gleason 8, volume 24.4cc  . S/P CABG x 4   . ST elevation MI (STEMI) ([place]) 02-10-2008   S/P CABG  . Stroke ([place])   . Urinary hesitancy   . Vision abnormalities      Past Surgical History:  Procedure Laterality Date  . CARDIAC CATHETERIZATION  05/01/2012   grafts widely patent  . CARDIOVASCULAR  STRESS TEST  03-15-2010   INFERIOR WALL SCAR WITHOUT ANY MEANINGFUL ISCHEMIA/ EF 46% / LOW RISK SCAN  . CORONARY ARTERY BYPASS GRAFT  02-10-2008  DR Einar Gip   X4 VESSEL DISEASE / [facility] CABG  . CYSTOSCOPY  02/08/2012   Procedure: CYSTOSCOPY FLEXIBLE;  Surgeon: Franchot Gallo, MD;  Location: [facility];  Service: Urology;;  . EP IMPLANTABLE DEVICE N/A 08/14/2016   Procedure: Loop Recorder Insertion;  Surgeon: Evans Lance, MD;  Location: [place] CV LAB;  Service: Cardiovascular;  Laterality: N/A;  . KNEE SURGERY  age 12   RIGHT  . LEFT HEART CATHETERIZATION WITH CORONARY/GRAFT ANGIOGRAM N/A 05/01/2012   Procedure: LEFT HEART CATHETERIZATION WITH Beatrix Fetters;  Surgeon: Sanda Klein, MD;  Location: [place] CATH LAB;  Service: Cardiovascular;  Laterality: N/A;  . MULTIPLE TEETH EXTRACTIONS (23)/ FOUR QUADRANT ALVEOLPLASTY/ MANDIBULAR LATERAL EXOSTOSES REDUCTIONS  03-24-2010   CHRONIC PERIODONTITIS  . RADIOACTIVE SEED IMPLANT  02/08/2012   Procedure: RADIOACTIVE SEED IMPLANT;  Surgeon: Franchot Gallo, MD;  Location[facility];  Service: Urology;  Laterality: N/A;  C-ARM   . SHOULDER SURGERY  1992   LEFT  . TEE WITHOUT CARDIOVERSION N/A 08/14/2016   Procedure: TRANSESOPHAGEAL ECHOCARDIOGRAM (TEE);  Surgeon: Josue Hector, MD;  Location: [facility] ENDOSCOPY;  Service: Cardiovascular;  Laterality: N/A;     Family History  Problem Relation Age of Onset  . Cancer Father        pancreatic  . Diabetes Father      Social History   Substance and Sexual Activity  Drug Use No     Social History   Substance and Sexual Activity  Alcohol Use Yes  . Alcohol/week: 1.2 oz  . Types: 1 Cans of beer, 1 Shots of liquor per week   Comment: occassional     Social History   Tobacco Use  Smoking Status Current Every Day Smoker  . Packs/day: 1.50  . Years: 50.00  . Pack years: 75.00  . Types: Cigarettes  Smokeless Tobacco Never Used  Tobacco  Comment   PREVIOUSLY SMOKED 3PPD / DECREASED TO 1PPD SINCE 2009     Outpatient Encounter Medications as of 02/25/2018  Medication Sig  . apixaban (ELIQUIS) 5 MG TABS tablet Take 1 tablet (5 mg total) by mouth 2 (two) times daily.  Marland Kitchen buPROPion (WELLBUTRIN XL) 150 MG 24 hr tablet one tablet twice daily  . dapagliflozin propanediol (FARXIGA) 5 MG TABS tablet Take 5 mg by mouth daily.  Marland Kitchen gabapentin (NEURONTIN) 300 MG capsule Take 1 tablet every morning and afternoon. 2 tablets at dinner time.  Marland Kitchen lisinopril (PRINIVIL,ZESTRIL) 5 MG tablet Take 0.5 tablets (2.5 mg total) by mouth daily.  . metFORMIN (GLUCOPHAGE) 500 MG tablet Take 1 tablet (500 mg total) by mouth 2 (two) times daily with a meal. (Patient taking differently: Take 500 mg by mouth daily with breakfast. )  .  Multiple Vitamins-Minerals (MULTIVITAMIN ADULT PO) Take 1 tablet by mouth daily.   . silodosin (RAPAFLO) 4 MG CAPS capsule Take 1 capsule (4 mg total) by mouth daily with breakfast.  . tamsulosin (FLOMAX) 0.4 MG CAPS capsule Take 1 capsule by mouth daily.  . [DISCONTINUED] atorvastatin (LIPITOR) 40 MG tablet Take 1 tablet (40 mg total) by mouth daily at 6 PM.  . [DISCONTINUED] buPROPion (WELLBUTRIN XL) 150 MG 24 hr tablet One tablet daily for three days, then increase to one tablet twice daily  . [DISCONTINUED] sulfamethoxazole-trimethoprim (BACTRIM DS,SEPTRA DS) 800-160 MG tablet Take 1 tablet by mouth 2 (two) times daily.  Marland Kitchen Zoster Vaccine Adjuvanted [facility]) injection Inject 0.5 mLs into the muscle once for 1 dose.   No facility-administered encounter medications on file as of 02/25/2018.     Allergies: Bee venom; Oysters [shellfish allergy]; and Penicillins  Body mass index is 35.51 kg/m.  Blood pressure 103/67, pulse 75, height 5' 8.5" (1.74 m), weight 237 lb (107.5 kg), SpO2 94 %.      Review of Systems  Constitutional: Positive for fatigue. Negative for activity change, appetite change, chills, diaphoresis, fever  and unexpected weight change.  HENT: Negative for congestion.   Eyes: Negative for visual disturbance.  Respiratory: Negative for cough, chest tightness, shortness of breath, wheezing and stridor.   Cardiovascular: Negative for chest pain, palpitations and leg swelling.  Gastrointestinal: Negative for abdominal distention, abdominal pain, blood in stool, constipation, diarrhea and nausea.  Endocrine: Negative for cold intolerance, heat intolerance, polydipsia, polyphagia and polyuria.  Genitourinary: Negative for difficulty urinating, flank pain and hematuria.  Skin: Negative for color change, pallor, rash and wound.  Neurological: Negative for dizziness and headaches.  Hematological: Does not bruise/bleed easily.  Psychiatric/Behavioral: Negative for dysphoric mood, self-injury, sleep disturbance and suicidal ideas. The patient is not nervous/anxious.        Objective:   Physical Exam  Constitutional: He is oriented to person, place, and time. He appears well-developed and well-nourished. No distress.  HENT:  Head: Normocephalic and atraumatic.  Right Ear: External ear normal.  Left Ear: External ear normal.  Cardiovascular: Normal rate, regular rhythm, normal heart sounds and intact distal pulses.  No murmur heard. Pulmonary/Chest: Effort normal and breath sounds normal. No respiratory distress. He has no wheezes. He has no rales.  Neurological: He is alert and oriented to person, place, and time.  Skin: Skin is warm and dry. No rash noted. He is not diaphoretic. No erythema. No pallor.  Psychiatric: He has a normal mood and affect. His behavior is normal. Judgment and thought content normal.  Nursing note and vitals reviewed.         Assessment & Plan:   1. Diabetes mellitus type 2 in obese ([place])   2. Encounter for screening for malignant neoplasm of respiratory organs   3. Screening for AAA (abdominal aortic aneurysm)   4. Need for zoster vaccination   5. Essential  hypertension   6. Long-term use of high-risk medication   7. Healthcare maintenance   8. Mixed hyperlipidemia   9. Tobacco abuse     Diabetes mellitus type 2 in obese [facility]) Lab Results  Component Value Date   HGBA1C 8.0 02/25/2018   HGBA1C 7.7 11/27/2017   HGBA1C 10.6 (H) 08/12/2016   He reports not taking Metformin as directed. Instructed to take Metformin 500mg  BID and continue Farxiga 5mg  once daily Follow Diabetic Diet and continue regular movement Last GFR 01/30/18 - 82   Healthcare maintenance Slight  increase in A1c- 8.0, please take all medications as directed. Follow Diabetic Diet and continue regular movement. Please return in the next few weeks for fasting and labs. Please schedule complete physical in 3 months. If L toe pain worsens, please follow-up with your Podiatrist. Continue to reduce to stop tobacco use- YOU CAN DO IT!  Hyperlipidemia Fasting labs ordered  Tobacco abuse Continue on Wellbutrin 150mg  BID He did reduce to 3/4 pack then due to stress, re: mother's health decline, he is back up to pack/day He reports stable mood and denies thoughts of harming himself or others Wellbutrin refilled   Essential hypertension BP at gaol 103/67, HR 75 He denies acute cardiac sx's He is followed by cards/Dr. Sallyanne Kuster     FOLLOW-UP:  Return in about 3 months (around 05/28/2018) for CPE.

## 2018-02-25 ENCOUNTER — Encounter: Payer: Self-pay | Admitting: Adult Health

## 2018-02-25 ENCOUNTER — Other Ambulatory Visit: Payer: Self-pay | Admitting: Adult Health

## 2018-02-25 ENCOUNTER — Ambulatory Visit (INDEPENDENT_AMBULATORY_CARE_PROVIDER_SITE_OTHER): Payer: PPO | Admitting: Adult Health

## 2018-02-25 VITALS — BP 103/67 | HR 75 | Ht 68.5 in | Wt 237.0 lb

## 2018-02-25 DIAGNOSIS — Z136 Encounter for screening for cardiovascular disorders: Secondary | ICD-10-CM

## 2018-02-25 DIAGNOSIS — Z122 Encounter for screening for malignant neoplasm of respiratory organs: Secondary | ICD-10-CM

## 2018-02-25 DIAGNOSIS — I10 Essential (primary) hypertension: Secondary | ICD-10-CM | POA: Diagnosis not present

## 2018-02-25 DIAGNOSIS — E782 Mixed hyperlipidemia: Secondary | ICD-10-CM

## 2018-02-25 DIAGNOSIS — Z0000 Encounter for general adult medical examination without abnormal findings: Secondary | ICD-10-CM

## 2018-02-25 DIAGNOSIS — E1169 Type 2 diabetes mellitus with other specified complication: Secondary | ICD-10-CM | POA: Diagnosis not present

## 2018-02-25 DIAGNOSIS — Z720 Tobacco use: Secondary | ICD-10-CM | POA: Diagnosis not present

## 2018-02-25 DIAGNOSIS — Z79899 Other long term (current) drug therapy: Secondary | ICD-10-CM

## 2018-02-25 DIAGNOSIS — Z23 Encounter for immunization: Secondary | ICD-10-CM

## 2018-02-25 DIAGNOSIS — E669 Obesity, unspecified: Secondary | ICD-10-CM

## 2018-02-25 LAB — POCT GLYCOSYLATED HEMOGLOBIN (HGB A1C): Hemoglobin A1C: 8

## 2018-02-25 MED ORDER — ZOSTER VAC RECOMB ADJUVANTED 50 MCG/0.5ML IM SUSR: 0.5000 mL | Freq: Once | INTRAMUSCULAR | 0 refills | Status: AC

## 2018-02-25 MED ORDER — BUPROPION HCL ER (XL) 150 MG PO TB24: ORAL_TABLET | ORAL | 1 refills | Status: DC

## 2018-02-25 NOTE — Assessment & Plan Note (Signed)
Slight increase in A1c- 8.0, please take all medications as directed. Follow Diabetic Diet and continue regular movement. Please return in the next few weeks for fasting and labs. Please schedule complete physical in 3 months. If L toe pain worsens, please follow-up with your Podiatrist. Continue to reduce to stop tobacco use- YOU CAN DO IT!

## 2018-02-25 NOTE — Assessment & Plan Note (Signed)
BP at gaol 103/67, HR 75 He denies acute cardiac sx's He is followed by cards/Dr. Sallyanne Kuster

## 2018-02-25 NOTE — Assessment & Plan Note (Addendum)
Lab Results  Component Value Date   HGBA1C 8.0 02/25/2018   HGBA1C 7.7 11/27/2017   HGBA1C 10.6 (H) 08/12/2016   He reports not taking Metformin as directed. Instructed to take Metformin 500mg  BID and continue Farxiga 5mg  once daily Follow Diabetic Diet and continue regular movement Last GFR 01/30/18 - 82

## 2018-02-25 NOTE — Assessment & Plan Note (Signed)
>>  ASSESSMENT AND PLAN FOR HYPERLIPIDEMIA WRITTEN ON 02/25/2018 10:03 AM BY DANFORD, KATY D, NP  Fasting labs ordered

## 2018-02-25 NOTE — Assessment & Plan Note (Signed)
Fasting labs ordered

## 2018-02-25 NOTE — Patient Instructions (Addendum)
Diabetes Mellitus and Nutrition When you have diabetes (diabetes mellitus), it is very important to have healthy eating habits because your blood sugar (glucose) levels are greatly affected by what you eat and drink. Eating healthy foods in the appropriate amounts, at about the same times every day, can help you:  Control your blood glucose.  Lower your risk of heart disease.  Improve your blood pressure.  Reach or maintain a healthy weight.  Every person with diabetes is different, and each person has different needs for a meal plan. Your health care provider may recommend that you work with a diet and nutrition specialist (dietitian) to make a meal plan that is best for you. Your meal plan may vary depending on factors such as:  The calories you need.  The medicines you take.  Your weight.  Your blood glucose, blood pressure, and cholesterol levels.  Your activity level.  Other health conditions you have, such as heart or kidney disease.  How do carbohydrates affect me? Carbohydrates affect your blood glucose level more than any other type of food. Eating carbohydrates naturally increases the amount of glucose in your blood. Carbohydrate counting is a method for keeping track of how many carbohydrates you eat. Counting carbohydrates is important to keep your blood glucose at a healthy level, especially if you use insulin or take certain oral diabetes medicines. It is important to know how many carbohydrates you can safely have in each meal. This is different for every person. Your dietitian can help you calculate how many carbohydrates you should have at each meal and for snack. Foods that contain carbohydrates include:  Bread, cereal, rice, pasta, and crackers.  Potatoes and corn.  Peas, beans, and lentils.  Milk and yogurt.  Fruit and juice.  Desserts, such as cakes, cookies, ice cream, and candy.  How does alcohol affect me? Alcohol can cause a sudden decrease in blood  glucose (hypoglycemia), especially if you use insulin or take certain oral diabetes medicines. Hypoglycemia can be a life-threatening condition. Symptoms of hypoglycemia (sleepiness, dizziness, and confusion) are similar to symptoms of having too much alcohol. If your health care provider says that alcohol is safe for you, follow these guidelines:  Limit alcohol intake to no more than 1 drink per day for nonpregnant women and 2 drinks per day for men. One drink equals 12 oz of beer, 5 oz of wine, or 1 oz of hard liquor.  Do not drink on an empty stomach.  Keep yourself hydrated with water, diet soda, or unsweetened iced tea.  Keep in mind that regular soda, juice, and other mixers may contain a lot of sugar and must be counted as carbohydrates.  What are tips for following this plan? Reading food labels  Start by checking the serving size on the label. The amount of calories, carbohydrates, fats, and other nutrients listed on the label are based on one serving of the food. Many foods contain more than one serving per package.  Check the total grams (g) of carbohydrates in one serving. You can calculate the number of servings of carbohydrates in one serving by dividing the total carbohydrates by 15. For example, if a food has 30 g of total carbohydrates, it would be equal to 2 servings of carbohydrates.  Check the number of grams (g) of saturated and trans fats in one serving. Choose foods that have low or no amount of these fats.  Check the number of milligrams (mg) of sodium in one serving. Most people   should limit total sodium intake to less than 2,300 mg per day.  Always check the nutrition information of foods labeled as "low-fat" or "nonfat". These foods may be higher in added sugar or refined carbohydrates and should be avoided.  Talk to your dietitian to identify your daily goals for nutrients listed on the label. Shopping  Avoid buying canned, premade, or processed foods. These  foods tend to be high in fat, sodium, and added sugar.  Shop around the outside edge of the grocery store. This includes fresh fruits and vegetables, bulk grains, fresh meats, and fresh dairy. Cooking  Use low-heat cooking methods, such as baking, instead of high-heat cooking methods like deep frying.  Cook using healthy oils, such as olive, canola, or sunflower oil.  Avoid cooking with butter, cream, or high-fat meats. Meal planning  Eat meals and snacks regularly, preferably at the same times every day. Avoid going long periods of time without eating.  Eat foods high in fiber, such as fresh fruits, vegetables, beans, and whole grains. Talk to your dietitian about how many servings of carbohydrates you can eat at each meal.  Eat 4-6 ounces of lean protein each day, such as lean meat, chicken, fish, eggs, or tofu. 1 ounce is equal to 1 ounce of meat, chicken, or fish, 1 egg, or 1/4 cup of tofu.  Eat some foods each day that contain healthy fats, such as avocado, nuts, seeds, and fish. Lifestyle   Check your blood glucose regularly.  Exercise at least 30 minutes 5 or more days each week, or as told by your health care provider.  Take medicines as told by your health care provider.  Do not use any products that contain nicotine or tobacco, such as cigarettes and e-cigarettes. If you need help quitting, ask your health care provider.  Work with a Social worker or diabetes educator to identify strategies to manage stress and any emotional and social challenges. What are some questions to ask my health care provider?  Do I need to meet with a diabetes educator?  Do I need to meet with a dietitian?  What number can I call if I have questions?  When are the best times to check my blood glucose? Where to find more information:  American Diabetes Association: diabetes.org/food-and-fitness/food  Academy of Nutrition and Dietetics:  PokerClues.dk  [employer] of Diabetes and Digestive and Kidney Diseases (NIH): ContactWire.be Summary  A healthy meal plan will help you control your blood glucose and maintain a healthy lifestyle.  Working with a diet and nutrition specialist (dietitian) can help you make a meal plan that is best for you.  Keep in mind that carbohydrates and alcohol have immediate effects on your blood glucose levels. It is important to count carbohydrates and to use alcohol carefully. This information is not intended to replace advice given to you by your health care provider. Make sure you discuss any questions you have with your health care provider. Document Released: 09/07/2005 Document Revised: 01/15/2017 Document Reviewed: 01/15/2017 Elsevier Interactive Patient Education  2018 [place].   Slight increase in A1c- 8.0, please take all medications as directed. Follow Diabetic Diet and continue regular movement. Please return in the next few weeks for fasting and labs. Please schedule complete physical in 3 months. If L toe pain worsens, please follow-up with your Podiatrist. Continue to reduce to stop tobacco use- YOU CAN DO IT! NICE TO SEE YOU!

## 2018-02-25 NOTE — Assessment & Plan Note (Signed)
Continue on Wellbutrin 150mg  BID He did reduce to 3/4 pack then due to stress, re: mother's health decline, he is back up to pack/day He reports stable mood and denies thoughts of harming himself or others Wellbutrin refilled

## 2018-03-04 ENCOUNTER — Encounter (HOSPITAL_COMMUNITY): Payer: PPO

## 2018-03-07 LAB — CUP PACEART REMOTE DEVICE CHECK
Implantable Pulse Generator Implant Date: 20170821
MDC IDC SESS DTM: 20190213230921

## 2018-03-08 ENCOUNTER — Other Ambulatory Visit: Payer: PPO

## 2018-03-11 ENCOUNTER — Ambulatory Visit (INDEPENDENT_AMBULATORY_CARE_PROVIDER_SITE_OTHER): Payer: PPO | Admitting: *Deleted

## 2018-03-11 DIAGNOSIS — I63411 Cerebral infarction due to embolism of right middle cerebral artery: Secondary | ICD-10-CM | POA: Diagnosis not present

## 2018-03-12 NOTE — Progress Notes (Signed)
Carelink Summary Report / Loop Recorder 

## 2018-03-13 ENCOUNTER — Ambulatory Visit (HOSPITAL_COMMUNITY)
Admission: RE | Admit: 2018-03-13 | Payer: PPO | Source: Ambulatory Visit | Attending: Adult Health | Admitting: Adult Health

## 2018-03-13 DIAGNOSIS — R0989 Other specified symptoms and signs involving the circulatory and respiratory systems: Secondary | ICD-10-CM

## 2018-03-14 ENCOUNTER — Other Ambulatory Visit: Payer: PPO

## 2018-03-14 DIAGNOSIS — E1169 Type 2 diabetes mellitus with other specified complication: Secondary | ICD-10-CM

## 2018-03-14 DIAGNOSIS — E669 Obesity, unspecified: Secondary | ICD-10-CM | POA: Diagnosis not present

## 2018-03-14 DIAGNOSIS — Z0000 Encounter for general adult medical examination without abnormal findings: Secondary | ICD-10-CM

## 2018-03-14 DIAGNOSIS — E785 Hyperlipidemia, unspecified: Secondary | ICD-10-CM | POA: Diagnosis not present

## 2018-03-14 DIAGNOSIS — I10 Essential (primary) hypertension: Secondary | ICD-10-CM | POA: Diagnosis not present

## 2018-03-15 ENCOUNTER — Other Ambulatory Visit: Payer: Self-pay | Admitting: Adult Health

## 2018-03-15 ENCOUNTER — Ambulatory Visit: Payer: PPO

## 2018-03-15 LAB — CBC WITH DIFFERENTIAL/PLATELET
BASOS ABS: 0.1 10*3/uL (ref 0.0–0.2)
Basos: 0 %
EOS (ABSOLUTE): 0.3 10*3/uL (ref 0.0–0.4)
EOS: 3 %
HEMATOCRIT: 45.5 % (ref 37.5–51.0)
HEMOGLOBIN: 15.4 g/dL (ref 13.0–17.7)
Immature Grans (Abs): 0 10*3/uL (ref 0.0–0.1)
Immature Granulocytes: 0 %
LYMPHS ABS: 4.6 10*3/uL — AB (ref 0.7–3.1)
Lymphs: 39 %
MCH: 29.7 pg (ref 26.6–33.0)
MCHC: 33.8 g/dL (ref 31.5–35.7)
MCV: 88 fL (ref 79–97)
MONOCYTES: 7 %
Monocytes Absolute: 0.8 10*3/uL (ref 0.1–0.9)
NEUTROS ABS: 5.9 10*3/uL (ref 1.4–7.0)
Neutrophils: 51 %
Platelets: 232 10*3/uL (ref 150–379)
RBC: 5.19 x10E6/uL (ref 4.14–5.80)
RDW: 15.2 % (ref 12.3–15.4)
WBC: 11.7 10*3/uL — ABNORMAL HIGH (ref 3.4–10.8)

## 2018-03-15 LAB — COMPREHENSIVE METABOLIC PANEL
ALBUMIN: 4.2 g/dL (ref 3.6–4.8)
ALK PHOS: 97 IU/L (ref 39–117)
ALT: 21 IU/L (ref 0–44)
AST: 18 IU/L (ref 0–40)
Albumin/Globulin Ratio: 1.4 (ref 1.2–2.2)
BILIRUBIN TOTAL: 0.4 mg/dL (ref 0.0–1.2)
BUN / CREAT RATIO: 13 (ref 10–24)
BUN: 10 mg/dL (ref 8–27)
CHLORIDE: 103 mmol/L (ref 96–106)
CO2: 21 mmol/L (ref 20–29)
Calcium: 9.8 mg/dL (ref 8.6–10.2)
Creatinine, Ser: 0.8 mg/dL (ref 0.76–1.27)
GFR calc Af Amer: 108 mL/min/{1.73_m2} (ref >59)
GFR calc non Af Amer: 93 mL/min/{1.73_m2} (ref >59)
GLOBULIN, TOTAL: 3 g/dL (ref 1.5–4.5)
Glucose: 150 mg/dL — ABNORMAL HIGH (ref 65–99)
POTASSIUM: 4.3 mmol/L (ref 3.5–5.2)
SODIUM: 139 mmol/L (ref 134–144)
Total Protein: 7.2 g/dL (ref 6.0–8.5)

## 2018-03-15 LAB — LIPID PANEL
CHOL/HDL RATIO: 4.7 ratio (ref 0.0–5.0)
Cholesterol, Total: 151 mg/dL (ref 100–199)
HDL: 32 mg/dL — ABNORMAL LOW (ref >39)
LDL Calculated: 96 mg/dL (ref 0–99)
Triglycerides: 113 mg/dL (ref 0–149)
VLDL Cholesterol Cal: 23 mg/dL (ref 5–40)

## 2018-03-15 LAB — HEMOGLOBIN A1C
Est. average glucose Bld gHb Est-mCnc: 183 mg/dL
HEMOGLOBIN A1C: 8 % — AB (ref 4.8–5.6)

## 2018-03-15 LAB — TSH: TSH: 2.29 u[IU]/mL (ref 0.450–4.500)

## 2018-03-15 LAB — MAGNESIUM: MAGNESIUM: 1.7 mg/dL (ref 1.6–2.3)

## 2018-03-15 MED ORDER — METFORMIN HCL 1000 MG PO TABS: 1000.0000 mg | ORAL_TABLET | Freq: Two times a day (BID) | ORAL | 2 refills | Status: DC

## 2018-03-19 ENCOUNTER — Ambulatory Visit
Admission: RE | Admit: 2018-03-19 | Discharge: 2018-03-19 | Disposition: A | Discharge status: Home / Self Care | Payer: PPO | Source: Ambulatory Visit | Attending: Adult Health | Admitting: Adult Health

## 2018-03-19 DIAGNOSIS — J438 Other emphysema: Secondary | ICD-10-CM | POA: Insufficient documentation

## 2018-03-19 DIAGNOSIS — F17210 Nicotine dependence, cigarettes, uncomplicated: Secondary | ICD-10-CM | POA: Diagnosis not present

## 2018-03-19 HISTORY — DX: Other emphysema: J43.8

## 2018-03-19 LAB — HM DIABETES EYE EXAM

## 2018-03-20 ENCOUNTER — Other Ambulatory Visit: Payer: Self-pay | Admitting: Adult Health

## 2018-03-20 DIAGNOSIS — R911 Solitary pulmonary nodule: Secondary | ICD-10-CM

## 2018-03-21 ENCOUNTER — Telehealth: Payer: Self-pay

## 2018-03-21 ENCOUNTER — Other Ambulatory Visit: Payer: Self-pay

## 2018-03-21 MED ORDER — ATORVASTATIN CALCIUM 40 MG PO TABS: 40.0000 mg | ORAL_TABLET | Freq: Every day | ORAL | 3 refills | Status: DC

## 2018-03-21 NOTE — Telephone Encounter (Signed)
Notes recorded by Esaw Grandchild, NP on 03/20/2018 at 3:23 PM EDT Afternoon [place], Can you please call Mr. Fan and share: LDCT revealed one irregular RUL pulmonary nodule, probably benign. Recommended to repeat imaging in 6 months.

## 2018-03-21 NOTE — Telephone Encounter (Signed)
Pt informed of results.  Pt expressed understanding and is agreeable.  T. Nelson, CMA 

## 2018-04-01 ENCOUNTER — Ambulatory Visit (INDEPENDENT_AMBULATORY_CARE_PROVIDER_SITE_OTHER): Payer: PPO | Admitting: Adult Health

## 2018-04-01 ENCOUNTER — Encounter: Payer: Self-pay | Admitting: Adult Health

## 2018-04-01 VITALS — BP 116/75 | HR 96 | Temp 98.3°F | Ht 68.5 in | Wt 234.5 lb

## 2018-04-01 DIAGNOSIS — R350 Frequency of micturition: Secondary | ICD-10-CM | POA: Insufficient documentation

## 2018-04-01 DIAGNOSIS — S81812A Laceration without foreign body, left lower leg, initial encounter: Secondary | ICD-10-CM | POA: Diagnosis not present

## 2018-04-01 MED ORDER — DOXYCYCLINE HYCLATE 100 MG PO TABS: 100.0000 mg | ORAL_TABLET | Freq: Two times a day (BID) | ORAL | 0 refills | Status: DC

## 2018-04-01 NOTE — Assessment & Plan Note (Addendum)
Increased frequency the last 2 months Hx of Prostate Ca Currently taking Tamsulosin 0.4mg  QD, Silodosin 4mg  QD Advised to f/u with established Urologist

## 2018-04-01 NOTE — Assessment & Plan Note (Signed)
Tetanus Immunization UTD Wound cleansed, dressed. Care instructions provided. Doxycycline rx provided. Discussed Red Flag sx's and to call clinic with any questions/concerns.

## 2018-04-01 NOTE — Patient Instructions (Signed)

## 2018-04-01 NOTE — Progress Notes (Addendum)
Subjective:    Patient ID: [name], male    DOB: 1951-04-30, 67 y.o.   MRN: [id number]  HPI:  [name] presents with laceration LLE that he sustained 03/29/18. He denies pain at site, but reports copious clear "watery-type drainage" from site. He denies fever/night sweats/deceased appetite. He denies pain or change in sensation in LLE He reports AM BS running 140-150s, denies episodes of hypoglycemia. Last Tetanus immunization 01/07/18.   Patient Care Team    Relationship Specialty Notifications Start End  [place], Valetta Fuller D, NP PCP - General Family Medicine  11/27/17   Sanda Klein, MD PCP - Cardiology Cardiology Admissions 01/16/18     Patient Active Problem List   Diagnosis Date Noted  . Laceration of skin of left lower leg 04/01/2018  . Increased urinary frequency 04/01/2018  . Hematuria 01/30/2018  . Puncture wound of toe of left foot 01/07/2018  . At risk for diabetic foot ulcer 01/07/2018  . Need for pneumococcal vaccination 11/27/2017  . Screening for colon cancer 11/27/2017  . Healthcare maintenance 11/27/2017  . Paroxysmal atrial fibrillation ([place]) 11/22/2016  . History of embolic stroke 78/67/6720  . Diabetes mellitus type 2 in obese ([place]) 11/22/2016  . Venous insufficiency of both lower extremities 09/21/2016  . Stroke ([place]) 08/12/2016  . Hyperglycemia   . Essential hypertension   . Vision, loss, sudden   . CVA (cerebral infarction) 08/11/2016  . CAD - inferior MI 2009, CABG 2009, patent grafts cath 04/2012 09/23/2013  . Obesity 09/23/2013  . Hyperlipidemia 09/23/2013  . Tobacco abuse 09/23/2013  . Prostate cancer ([place]) 08/18/2011  . Myocardial infarction [facility])      Past Medical History:  Diagnosis Date  . Chronic venous insufficiency LOWER EXTREMITIES  . Coronary artery disease CARDIOLOGIST - DR  [id number]-  LAST 1 WK AGO -- WILL REQUEST NOTE AND STRESS TEST  . Diabetes mellitus without complication ([place])   . Hyperglycemia   . Hyperlipidemia   .  Hypertension   . Mixed dyslipidemia   . Myocardial infarction ([place])   . Neuropathy   . Nocturia   . Prostate cancer ([place]) 08/18/11   gleason 8, volume 24.4cc  . S/P CABG x 4   . ST elevation MI (STEMI) ([place]) 02-10-2008   S/P CABG  . Stroke ([place])   . Urinary hesitancy   . Vision abnormalities      Past Surgical History:  Procedure Laterality Date  . CARDIAC CATHETERIZATION  05/01/2012   grafts widely patent  . CARDIOVASCULAR STRESS TEST  03-15-2010   INFERIOR WALL SCAR WITHOUT ANY MEANINGFUL ISCHEMIA/ EF 46% / LOW RISK SCAN  . CORONARY ARTERY BYPASS GRAFT  02-10-2008  DR Einar Gip   X4 VESSEL DISEASE / [facility] CABG  . CYSTOSCOPY  02/08/2012   Procedure: CYSTOSCOPY FLEXIBLE;  Surgeon: Franchot Gallo, MD;  Location: [facility];  Service: Urology;;  . EP IMPLANTABLE DEVICE N/A 08/14/2016   Procedure: Loop Recorder Insertion;  Surgeon: Evans Lance, MD;  Location: [place] [facility];  Service: Cardiovascular;  Laterality: N/A;  . KNEE SURGERY  age 45   RIGHT  . LEFT HEART CATHETERIZATION WITH CORONARY/GRAFT ANGIOGRAM N/A 05/01/2012   Procedure: LEFT HEART CATHETERIZATION WITH Beatrix Fetters;  Surgeon: Sanda Klein, MD;  Location: [place] CATH LAB;  Service: Cardiovascular;  Laterality: N/A;  . MULTIPLE TEETH EXTRACTIONS (23)/ FOUR QUADRANT ALVEOLPLASTY/ MANDIBULAR LATERAL EXOSTOSES REDUCTIONS  03-24-2010   CHRONIC PERIODONTITIS  . RADIOACTIVE SEED IMPLANT  02/08/2012   Procedure: RADIOACTIVE SEED  IMPLANT;  Surgeon: Franchot Gallo, MD;  Location: [facility];  Service: Urology;  Laterality: N/A;  C-ARM   . SHOULDER SURGERY  1992   LEFT  . TEE WITHOUT CARDIOVERSION N/A 08/14/2016   Procedure: TRANSESOPHAGEAL ECHOCARDIOGRAM (TEE);  Surgeon: Josue Hector, MD;  Location: [facility] ENDOSCOPY;  Service: Cardiovascular;  Laterality: N/A;     Family History  Problem Relation Age of Onset  . Cancer Father        pancreatic  . Diabetes Father       Social History   Substance and Sexual Activity  Drug Use No     Social History   Substance and Sexual Activity  Alcohol Use Yes  . Alcohol/week: 1.2 oz  . Types: 1 Cans of beer, 1 Shots of liquor per week   Comment: occassional     Social History   Tobacco Use  Smoking Status Current Every Day Smoker  . Packs/day: 1.50  . Years: 50.00  . Pack years: 75.00  . Types: Cigarettes  Smokeless Tobacco Never Used  Tobacco Comment   PREVIOUSLY SMOKED 3PPD / DECREASED TO 1PPD SINCE 2009     Outpatient Encounter Medications as of 04/01/2018  Medication Sig  . apixaban (ELIQUIS) 5 MG TABS tablet Take 1 tablet (5 mg total) by mouth 2 (two) times daily.  Marland Kitchen atorvastatin (LIPITOR) 40 MG tablet Take 1 tablet (40 mg total) by mouth daily.  Marland Kitchen buPROPion (WELLBUTRIN XL) 150 MG 24 hr tablet one tablet twice daily  . dapagliflozin propanediol (FARXIGA) 5 MG TABS tablet Take 5 mg by mouth daily.  Marland Kitchen gabapentin (NEURONTIN) 300 MG capsule Take 1 tablet every morning and afternoon. 2 tablets at dinner time.  Marland Kitchen lisinopril (PRINIVIL,ZESTRIL) 5 MG tablet Take 0.5 tablets (2.5 mg total) by mouth daily.  . metFORMIN (GLUCOPHAGE) 1000 MG tablet Take 1 tablet (1,000 mg total) by mouth 2 (two) times daily with a meal.  . Multiple Vitamins-Minerals (MULTIVITAMIN ADULT PO) Take 1 tablet by mouth daily.   . silodosin (RAPAFLO) 4 MG CAPS capsule Take 1 capsule (4 mg total) by mouth daily with breakfast.  . tamsulosin (FLOMAX) 0.4 MG CAPS capsule Take 1 capsule by mouth daily.  Marland Kitchen doxycycline (VIBRA-TABS) 100 MG tablet Take 1 tablet (100 mg total) by mouth 2 (two) times daily.   No facility-administered encounter medications on file as of 04/01/2018.     Allergies: Bee venom; Oysters [shellfish allergy]; and Penicillins  Body mass index is 35.14 kg/m.  Blood pressure 116/75, pulse 96, temperature 98.3 F (36.8 C), temperature source Oral, height 5' 8.5" (1.74 m), weight 234 lb 8 oz (106.4 kg), SpO2  96 %.   Review of Systems  Constitutional: Positive for fatigue. Negative for activity change, appetite change, chills, diaphoresis, fever and unexpected weight change.  Eyes: Negative for visual disturbance.  Respiratory: Negative for cough, chest tightness, shortness of breath, wheezing and stridor.   Cardiovascular: Negative for chest pain, palpitations and leg swelling.  Genitourinary: Positive for frequency.      Objective:   Physical Exam  Constitutional: He is oriented to person, place, and time. He appears well-developed and well-nourished. No distress.  HENT:  Head: Normocephalic and atraumatic.  Right Ear: External ear normal.  Left Ear: External ear normal.  Eyes: Pupils are equal, round, and reactive to light. Conjunctivae are normal.  Cardiovascular: Normal rate, regular rhythm, normal heart sounds and intact distal pulses.  Pulmonary/Chest: Effort normal and breath sounds normal. No respiratory distress. He has  no wheezes. He has no rales. He exhibits no tenderness.  Neurological: He is alert and oriented to person, place, and time.  Skin: Skin is warm and dry. Laceration noted. No rash noted. He is not diaphoretic. There is erythema. No pallor.  2cmx 2 cm laceration on L shin Center is open and serousanguinous fluids weeping from wound. Bil lower extremity edema and hyperpigmentation- his baseline  Psychiatric: He has a normal mood and affect. His behavior is normal. Judgment and thought content normal.  Nursing note and vitals reviewed.     Assessment & Plan:   1. Laceration of skin of left lower leg, initial encounter   2. Increased urinary frequency     Laceration of skin of left lower leg Tetanus Immunization UTD Wound cleansed, dressed. Care instructions provided. Doxycycline rx provided. Discussed Red Flag sx's and to call clinic with any questions/concerns.  Increased urinary frequency Increased frequency the last 2 months Hx of Prostate  Ca Currently taking Tamsulosin 0.4mg  QD, Silodosin 4mg  QD Advised to f/u with established Urologist   FOLLOW-UP:  Return if symptoms worsen or fail to improve.

## 2018-04-02 ENCOUNTER — Telehealth: Payer: Self-pay | Admitting: Adult Health

## 2018-04-02 DIAGNOSIS — E669 Obesity, unspecified: Principal | ICD-10-CM

## 2018-04-02 DIAGNOSIS — E1169 Type 2 diabetes mellitus with other specified complication: Secondary | ICD-10-CM

## 2018-04-02 MED ORDER — GLUCOSE BLOOD VI STRP: ORAL_STRIP | 12 refills | Status: AC

## 2018-04-02 NOTE — Telephone Encounter (Signed)
LVM informing pt of refill sent to pharmacy.  Charyl Bigger, CMA

## 2018-04-02 NOTE — Telephone Encounter (Signed)
Patient was in yesterday and forgot to mention he is out of test strips for his Free Style Lite meter. Please send to [facility] on [address].

## 2018-04-02 NOTE — Addendum Note (Signed)
Addended by: Fonnie Mu on: 04/02/2018 08:38 AM   Modules accepted: Orders

## 2018-04-15 ENCOUNTER — Ambulatory Visit (INDEPENDENT_AMBULATORY_CARE_PROVIDER_SITE_OTHER): Payer: PPO | Admitting: *Deleted

## 2018-04-15 DIAGNOSIS — I63411 Cerebral infarction due to embolism of right middle cerebral artery: Secondary | ICD-10-CM | POA: Diagnosis not present

## 2018-04-15 NOTE — Progress Notes (Signed)
Carelink Summary Report / Loop Recorder 

## 2018-04-17 ENCOUNTER — Ambulatory Visit (INDEPENDENT_AMBULATORY_CARE_PROVIDER_SITE_OTHER): Payer: PPO | Admitting: Adult Health

## 2018-04-17 ENCOUNTER — Encounter: Payer: Self-pay | Admitting: Adult Health

## 2018-04-17 VITALS — BP 109/67 | HR 82 | Temp 97.6°F | Ht 68.5 in | Wt 238.5 lb

## 2018-04-17 DIAGNOSIS — S81812A Laceration without foreign body, left lower leg, initial encounter: Secondary | ICD-10-CM | POA: Insufficient documentation

## 2018-04-17 DIAGNOSIS — S81812D Laceration without foreign body, left lower leg, subsequent encounter: Secondary | ICD-10-CM | POA: Diagnosis not present

## 2018-04-17 MED ORDER — CLINDAMYCIN HCL 300 MG PO CAPS: 300.0000 mg | ORAL_CAPSULE | Freq: Three times a day (TID) | ORAL | 0 refills | Status: DC

## 2018-04-17 NOTE — Patient Instructions (Addendum)

## 2018-04-17 NOTE — Assessment & Plan Note (Signed)
He completed full curse of Doxycycline. Wound worsened last night- increased warmth, redness, edema, and new onset intermittent sharp shooting pain. Please start Clindamycin 300mg  three times daily, take for 10 days. Please follow wound care instructions. Urgent referral to Diabetic Wound Care placed. Please keep tight control on your blood sugar. Please call clinic with any questions/concerns.

## 2018-04-17 NOTE — Progress Notes (Signed)
Subjective:    Patient ID: [name], male    DOB: February 13, 1951, 67 y.o.   MRN: [id number]  HPI:  04/01/18 OV: [name] presents with laceration LLE that he sustained 03/29/18. He denies pain at site, but reports copious clear "watery-type drainage" from site. He denies fever/night sweats/deceased appetite. He denies pain or change in sensation in LLE He reports AM BS running 140-150s, denies episodes of hypoglycemia. Last Tetanus immunization 01/07/18.  04/17/18 OV: [name] presents with worsening infection of laceration of LLE he sustained 03/29/18.  He reports completing course of  Doxycycline as directed. He reports that it was "getting better than last night it wasn't".  He reports increased swelling and intermittent sharp, shooting pain rated 8/10. Pain will originate over wound and radiate to top of L foot. He denies fever/night sweats/malaise/N/V/D/poor appetite. He reports AM BS have bene running 130-140s, he denies episodes of hypoglycemia. Marland Kitchenkd  Patient Care Team    Relationship Specialty Notifications Start End  [place], Berna Spare, NP PCP - General Family Medicine  11/27/17   Sanda Klein, MD PCP - Cardiology Cardiology Admissions 01/16/18     Patient Active Problem List   Diagnosis Date Noted  . Laceration of left lower leg 04/17/2018  . Laceration of skin of left lower leg 04/01/2018  . Increased urinary frequency 04/01/2018  . Hematuria 01/30/2018  . Puncture wound of toe of left foot 01/07/2018  . At risk for diabetic foot ulcer 01/07/2018  . Need for pneumococcal vaccination 11/27/2017  . Screening for colon cancer 11/27/2017  . Healthcare maintenance 11/27/2017  . Paroxysmal atrial fibrillation ([place]) 11/22/2016  . History of embolic stroke 62/95/2841  . Diabetes mellitus type 2 in obese ([place]) 11/22/2016  . Venous insufficiency of both lower extremities 09/21/2016  . Stroke ([place]) 08/12/2016  . Hyperglycemia   . Essential hypertension   . Vision, loss, sudden    . CVA (cerebral infarction) 08/11/2016  . CAD - inferior MI 2009, CABG 2009, patent grafts cath 04/2012 09/23/2013  . Obesity 09/23/2013  . Hyperlipidemia 09/23/2013  . Tobacco abuse 09/23/2013  . Prostate cancer ([place]) 08/18/2011  . Myocardial infarction [facility])      Past Medical History:  Diagnosis Date  . Chronic venous insufficiency LOWER EXTREMITIES  . Coronary artery disease CARDIOLOGIST - DR  [id number]-  LAST 1 WK AGO -- WILL REQUEST NOTE AND STRESS TEST  . Diabetes mellitus without complication ([place])   . Hyperglycemia   . Hyperlipidemia   . Hypertension   . Mixed dyslipidemia   . Myocardial infarction ([place])   . Neuropathy   . Nocturia   . Prostate cancer ([place]) 08/18/11   gleason 8, volume 24.4cc  . S/P CABG x 4   . ST elevation MI (STEMI) ([place]) 02-10-2008   S/P CABG  . Stroke ([place])   . Urinary hesitancy   . Vision abnormalities      Past Surgical History:  Procedure Laterality Date  . CARDIAC CATHETERIZATION  05/01/2012   grafts widely patent  . CARDIOVASCULAR STRESS TEST  03-15-2010   INFERIOR WALL SCAR WITHOUT ANY MEANINGFUL ISCHEMIA/ EF 46% / LOW RISK SCAN  . CORONARY ARTERY BYPASS GRAFT  02-10-2008  DR Einar Gip   X4 VESSEL DISEASE / [facility] CABG  . CYSTOSCOPY  02/08/2012   Procedure: CYSTOSCOPY FLEXIBLE;  Surgeon: Franchot Gallo, MD;  Location: [facility];  Service: Urology;;  . EP IMPLANTABLE DEVICE N/A 08/14/2016   Procedure: Loop Recorder Insertion;  Surgeon: Champ Mungo  Lovena Le, MD;  Location: [place] [facility];  Service: Cardiovascular;  Laterality: N/A;  . KNEE SURGERY  age 43   RIGHT  . LEFT HEART CATHETERIZATION WITH CORONARY/GRAFT ANGIOGRAM N/A 05/01/2012   Procedure: LEFT HEART CATHETERIZATION WITH Beatrix Fetters;  Surgeon: Sanda Klein, MD;  Location: [place] CATH LAB;  Service: Cardiovascular;  Laterality: N/A;  . MULTIPLE TEETH EXTRACTIONS (23)/ FOUR QUADRANT ALVEOLPLASTY/ MANDIBULAR LATERAL EXOSTOSES REDUCTIONS  03-24-2010    CHRONIC PERIODONTITIS  . RADIOACTIVE SEED IMPLANT  02/08/2012   Procedure: RADIOACTIVE SEED IMPLANT;  Surgeon: Franchot Gallo, MD;  Location: [facility];  Service: Urology;  Laterality: N/A;  C-ARM   . SHOULDER SURGERY  1992   LEFT  . TEE WITHOUT CARDIOVERSION N/A 08/14/2016   Procedure: TRANSESOPHAGEAL ECHOCARDIOGRAM (TEE);  Surgeon: Josue Hector, MD;  Location: [facility] ENDOSCOPY;  Service: Cardiovascular;  Laterality: N/A;     Family History  Problem Relation Age of Onset  . Cancer Father        pancreatic  . Diabetes Father      Social History   Substance and Sexual Activity  Drug Use No     Social History   Substance and Sexual Activity  Alcohol Use Yes  . Alcohol/week: 1.2 oz  . Types: 1 Cans of beer, 1 Shots of liquor per week   Comment: occassional     Social History   Tobacco Use  Smoking Status Current Every Day Smoker  . Packs/day: 1.50  . Years: 50.00  . Pack years: 75.00  . Types: Cigarettes  Smokeless Tobacco Never Used  Tobacco Comment   PREVIOUSLY SMOKED 3PPD / DECREASED TO 1PPD SINCE 2009     Outpatient Encounter Medications as of 04/17/2018  Medication Sig  . apixaban (ELIQUIS) 5 MG TABS tablet Take 1 tablet (5 mg total) by mouth 2 (two) times daily.  Marland Kitchen atorvastatin (LIPITOR) 40 MG tablet Take 1 tablet (40 mg total) by mouth daily.  Marland Kitchen buPROPion (WELLBUTRIN XL) 150 MG 24 hr tablet one tablet twice daily  . clindamycin (CLEOCIN) 300 MG capsule Take 1 capsule (300 mg total) by mouth 3 (three) times daily.  . dapagliflozin propanediol (FARXIGA) 5 MG TABS tablet Take 5 mg by mouth daily.  Marland Kitchen doxycycline (VIBRA-TABS) 100 MG tablet Take 1 tablet (100 mg total) by mouth 2 (two) times daily.  Marland Kitchen gabapentin (NEURONTIN) 300 MG capsule Take 1 tablet every morning and afternoon. 2 tablets at dinner time.  Marland Kitchen glucose blood (FREESTYLE LITE) test strip Use to check blood sugars every morning fasting  . lisinopril (PRINIVIL,ZESTRIL) 5 MG tablet  Take 0.5 tablets (2.5 mg total) by mouth daily.  . metFORMIN (GLUCOPHAGE) 1000 MG tablet Take 1 tablet (1,000 mg total) by mouth 2 (two) times daily with a meal.  . Multiple Vitamins-Minerals (MULTIVITAMIN ADULT PO) Take 1 tablet by mouth daily.   . silodosin (RAPAFLO) 4 MG CAPS capsule Take 1 capsule (4 mg total) by mouth daily with breakfast.  . tamsulosin (FLOMAX) 0.4 MG CAPS capsule Take 1 capsule by mouth daily.   No facility-administered encounter medications on file as of 04/17/2018.     Allergies: Bee venom; Oysters [shellfish allergy]; and Penicillins  Body mass index is 35.74 kg/m.  Blood pressure 109/67, pulse 82, temperature 97.6 F (36.4 C), temperature source Oral, height 5' 8.5" (1.74 m), weight 238 lb 8 oz (108.2 kg), SpO2 96 %.   Review of Systems  Constitutional: Positive for fatigue. Negative for activity change, appetite change, chills, diaphoresis,  fever and unexpected weight change.  Eyes: Negative for visual disturbance.  Respiratory: Negative for cough, chest tightness, shortness of breath, wheezing and stridor.   Cardiovascular: Negative for chest pain, palpitations and leg swelling.  Genitourinary: Positive for frequency.      Objective:   Physical Exam  Constitutional: He is oriented to person, place, and time. He appears well-developed and well-nourished. No distress.  HENT:  Head: Normocephalic and atraumatic.  Right Ear: External ear normal.  Left Ear: External ear normal.  Eyes: Pupils are equal, round, and reactive to light. Conjunctivae are normal.  Cardiovascular: Normal rate, regular rhythm, normal heart sounds and intact distal pulses.  Pulmonary/Chest: Effort normal and breath sounds normal. No respiratory distress. He has no wheezes. He has no rales. He exhibits no tenderness.  Neurological: He is alert and oriented to person, place, and time.  Skin: Skin is warm and dry. Laceration noted. No rash noted. He is not diaphoretic. There is  erythema. No pallor.  2cmx 2 cm laceration on L shin Center is crusted over with dark brown/black tissue. LLE warm to the touch and edema worsened from his baseline.  Bil lower extremity edema and hyperpigmentation- his baseline  Psychiatric: He has a normal mood and affect. His behavior is normal. Judgment and thought content normal.  Nursing note and vitals reviewed.     Assessment & Plan:   1. Laceration of skin of left lower leg, initial encounter   2. Laceration of left lower leg, subsequent encounter     Laceration of left lower leg He completed full curse of Doxycycline. Wound worsened last night- increased warmth, redness, edema, and new onset intermittent sharp shooting pain. Please start Clindamycin 300mg  three times daily, take for 10 days. Please follow wound care instructions. Urgent referral to Diabetic Wound Care placed. Please keep tight control on your blood sugar. Please call clinic with any questions/concerns.  FOLLOW-UP:  Return if symptoms worsen or fail to improve.

## 2018-04-19 ENCOUNTER — Encounter: Payer: PPO | Attending: Physician Assistant | Admitting: Physician Assistant

## 2018-04-19 DIAGNOSIS — I890 Lymphedema, not elsewhere classified: Secondary | ICD-10-CM | POA: Insufficient documentation

## 2018-04-19 DIAGNOSIS — Z950 Presence of cardiac pacemaker: Secondary | ICD-10-CM | POA: Insufficient documentation

## 2018-04-19 DIAGNOSIS — L97822 Non-pressure chronic ulcer of other part of left lower leg with fat layer exposed: Secondary | ICD-10-CM | POA: Diagnosis not present

## 2018-04-19 DIAGNOSIS — F17210 Nicotine dependence, cigarettes, uncomplicated: Secondary | ICD-10-CM | POA: Diagnosis not present

## 2018-04-19 DIAGNOSIS — E11622 Type 2 diabetes mellitus with other skin ulcer: Secondary | ICD-10-CM | POA: Diagnosis not present

## 2018-04-19 DIAGNOSIS — I2510 Atherosclerotic heart disease of native coronary artery without angina pectoris: Secondary | ICD-10-CM | POA: Diagnosis not present

## 2018-04-19 DIAGNOSIS — L97522 Non-pressure chronic ulcer of other part of left foot with fat layer exposed: Secondary | ICD-10-CM | POA: Insufficient documentation

## 2018-04-19 DIAGNOSIS — I872 Venous insufficiency (chronic) (peripheral): Secondary | ICD-10-CM | POA: Insufficient documentation

## 2018-04-19 DIAGNOSIS — I252 Old myocardial infarction: Secondary | ICD-10-CM | POA: Diagnosis not present

## 2018-04-19 DIAGNOSIS — I10 Essential (primary) hypertension: Secondary | ICD-10-CM | POA: Insufficient documentation

## 2018-04-19 DIAGNOSIS — Z8673 Personal history of transient ischemic attack (TIA), and cerebral infarction without residual deficits: Secondary | ICD-10-CM | POA: Diagnosis not present

## 2018-04-19 DIAGNOSIS — E11621 Type 2 diabetes mellitus with foot ulcer: Secondary | ICD-10-CM | POA: Insufficient documentation

## 2018-04-19 DIAGNOSIS — L97521 Non-pressure chronic ulcer of other part of left foot limited to breakdown of skin: Secondary | ICD-10-CM | POA: Diagnosis present

## 2018-04-19 DIAGNOSIS — E1140 Type 2 diabetes mellitus with diabetic neuropathy, unspecified: Secondary | ICD-10-CM | POA: Insufficient documentation

## 2018-04-19 LAB — CUP PACEART REMOTE DEVICE CHECK
MDC IDC PG IMPLANT DT: 20170821
MDC IDC SESS DTM: 20190318233707

## 2018-04-23 NOTE — Progress Notes (Signed)
[name], [name] ([id number]) Visit Report for 04/19/2018 Allergy List Details Patient Name: [name], [name]. Date of Service: 04/19/2018 8:00 AM Medical Record Number: [id number] Patient Account Number: [id number] Date of Birth/Sex: 1951/07/13 (67 y.o. M) Treating RN: Roger Shelter Primary Care Joylynn Defrancesco: BESS, Valetta Fuller Other Clinician: Referring Raynie Steinhaus: BESS, KATY Treating Rubel Heckard/Extender: STONE III, HOYT Weeks in Treatment: 0 Allergies Active Allergies bee venom protein (honey bee) penicillin oyster shell Allergy Notes Electronic Signature(s) Signed: 04/19/2018 4:07:12 PM By: Roger Shelter Entered By: Roger Shelter on 04/19/2018 08:15:15 [name], [name] ([id number]) -------------------------------------------------------------------------------- Arrival Information Details Patient Name: Marcus Loll T. Date of Service: 04/19/2018 8:00 AM Medical Record Number: [id number] Patient Account Number: [id number] Date of Birth/Sex: 23-Nov-1951 (67 y.o. M) Treating RN: Roger Shelter Primary Care Demonte Dobratz: BESS, KATY Other Clinician: Referring Brithney Bensen: BESS, KATY Treating Jerald Hennington/Extender: Melburn Hake, HOYT Weeks in Treatment: 0 Visit Information Patient Arrived: Ambulatory Arrival Time: 08:06 Accompanied By: wife Transfer Assistance: None Patient Identification Verified: Yes Secondary Verification Process Completed: Yes Electronic Signature(s) Signed: 04/19/2018 4:07:12 PM By: Roger Shelter Entered By: Roger Shelter on 04/19/2018 08:09:16 [name], [name] ([id number]) -------------------------------------------------------------------------------- Clinic Level of Care Assessment Details Patient Name: Marcus Lark. Date of Service: 04/19/2018 8:00 AM Medical Record Number: [id number] Patient Account Number: [id number] Date of Birth/Sex: 1951-12-22 (67 y.o. M) Treating RN: Montey Hora Primary Care Quincy Prisco: BESS, KATY Other Clinician: Referring Alexandre Lightsey:  BESS, KATY Treating Kambry Takacs/Extender: STONE III, HOYT Weeks in Treatment: 0 Clinic Level of Care Assessment Items TOOL 1 Quantity Score []  - Use when EandM and Procedure is performed on INITIAL visit 0 ASSESSMENTS - Nursing Assessment / Reassessment X - General Physical Exam (combine w/ comprehensive assessment (listed just below) when 1 20 performed on new pt. evals) X- 1 25 Comprehensive Assessment (HX, ROS, Risk Assessments, Wounds Hx, etc.) ASSESSMENTS - Wound and Skin Assessment / Reassessment []  - Dermatologic / Skin Assessment (not related to wound area) 0 ASSESSMENTS - Ostomy and/or Continence Assessment and Care []  - Incontinence Assessment and Management 0 []  - 0 Ostomy Care Assessment and Management (repouching, etc.) PROCESS - Coordination of Care X - Simple Patient / Family Education for ongoing care 1 15 []  - 0 Complex (extensive) Patient / Family Education for ongoing care X- 1 10 Staff obtains Programmer, systems, Records, Test Results / Process Orders []  - 0 Staff telephones HHA, Nursing Homes / Clarify orders / etc []  - 0 Routine Transfer to another Facility (non-emergent condition) []  - 0 Routine Hospital Admission (non-emergent condition) X- 1 15 New Admissions / Biomedical engineer / Ordering NPWT, Apligraf, etc. []  - 0 Emergency Hospital Admission (emergent condition) PROCESS - Special Needs []  - Pediatric / Minor Patient Management 0 []  - 0 Isolation Patient Management []  - 0 Hearing / Language / Visual special needs []  - 0 Assessment of Community assistance (transportation, D/C planning, etc.) []  - 0 Additional assistance / Altered mentation []  - 0 Support Surface(s) Assessment (bed, cushion, seat, etc.) [name], [name] ([id number]) INTERVENTIONS - Miscellaneous []  - External ear exam 0 []  - 0 Patient Transfer (multiple staff / Civil Service fast streamer / Similar devices) []  - 0 Simple Staple / Suture removal (25 or less) []  - 0 Complex Staple / Suture  removal (26 or more) []  - 0 Hypo/Hyperglycemic Management (do not check if billed separately) X- 1 15 Ankle / Brachial Index (ABI) - do not check if billed separately Has the patient been seen at the hospital within the last three years: Yes Total Score: 100 Level Of Care:  New/Established - Level 3 Electronic Signature(s) Signed: 04/19/2018 4:37:07 PM By: Montey Hora Entered By: Montey Hora on 04/19/2018 09:27:34 [name], [name] ([id number]) -------------------------------------------------------------------------------- Encounter Discharge Information Details Patient Name: Marcus Lark. Date of Service: 04/19/2018 8:00 AM Medical Record Number: [id number] Patient Account Number: [id number] Date of Birth/Sex: Sep 06, 1951 (67 y.o. M) Treating RN: Ahmed Prima Primary Care Clint Strupp: BESS, KATY Other Clinician: Referring Justine Dines: BESS, KATY Treating Karryn Kosinski/Extender: STONE III, HOYT Weeks in Treatment: 0 Encounter Discharge Information Items Discharge Pain Level: 0 Discharge Condition: Stable Ambulatory Status: Ambulatory Discharge Destination: Home Private Transportation: Auto Accompanied By: wife Schedule Follow-up Appointment: Yes Medication Reconciliation completed and provided No to Patient/Care Shaunae Sieloff: Clinical Summary of Care: Electronic Signature(s) Signed: 04/19/2018 10:07:15 AM By: Alric Quan Entered By: Alric Quan on 04/19/2018 10:07:15 [name], [name] ([id number]) -------------------------------------------------------------------------------- Lower Extremity Assessment Details Patient Name: Marcus Loll T. Date of Service: 04/19/2018 8:00 AM Medical Record Number: [id number] Patient Account Number: [id number] Date of Birth/Sex: [birth year] (67 y.o. M) Treating RN: Roger Shelter Primary Care Bright Spielmann: BESS, KATY Other Clinician: Referring Kelyn Ponciano: BESS, KATY Treating Lachrista Heslin/Extender: STONE III, HOYT Weeks in Treatment: 0 Edema  Assessment Assessed: [Left: Yes] [Right: No] [Left: Edema] [Right: :] Calf Left: Right: Point of Measurement: 34 cm From Medial Instep 39.6 cm 42.7 cm Ankle Left: Right: Point of Measurement: 11 cm From Medial Instep 27 cm 26.7 cm Vascular Assessment Claudication: Claudication Assessment [Left:None] [Right:None] Pulses: Dorsalis Pedis Palpable: [Left:Yes] [Right:Yes] Doppler Audible: [Left:Yes] [Right:Yes] Posterior Tibial Palpable: [Left:Yes] [Right:Yes] Doppler Audible: [Left:Yes] [Right:Yes] Extremity colors, hair growth, and conditions: Extremity Color: [Left:Hyperpigmented] [Right:Hyperpigmented] Hair Growth on Extremity: [Left:No] [Right:No] Temperature of Extremity: [Left:Warm] [Right:Warm] Capillary Refill: [Left:< 3 seconds] [Right:< 3 seconds] Blood Pressure: Brachial: [Left:90] [Right:120] Dorsalis Pedis: 100 [Left:Dorsalis Pedis: 976] Ankle: Posterior Tibial: 78 [Left:Posterior Tibial: 120 0.83] [Right:1.00] Toe Nail Assessment Left: Right: Thick: Yes Discolored: Yes Deformed: Yes Improper Length and Hygiene: Yes Electronic Signature(s) Signed: 04/19/2018 4:07:12 PM By: Daphene Jaeger ([id number]) Entered By: Roger Shelter on 04/19/2018 08:53:12 Poitra, [name] ([id number]) -------------------------------------------------------------------------------- Multi Wound Chart Details Patient Name: Marcus Loll T. Date of Service: 04/19/2018 8:00 AM Medical Record Number: [id number] Patient Account Number: [id number] Date of Birth/Sex: 07-19-1951 (67 y.o. M) Treating RN: Montey Hora Primary Care Laken Lobato: BESS, KATY Other Clinician: Referring Deron Poole: BESS, KATY Treating Tycho Cheramie/Extender: STONE III, HOYT Weeks in Treatment: 0 Vital Signs Height(in): 69 Pulse(bpm): 80 Weight(lbs): 238 Blood Pressure(mmHg): 128/64 Body Mass Index(BMI): 35 Temperature(F): 97.7 Respiratory Rate 18 (breaths/min): Photos: [1:No Photos] [2:No  Photos] [N/A:N/A] Wound Location: [1:Left Lower Leg - Midline, Anterior] [2:Left Toe Great] [N/A:N/A] Wounding Event: [1:Trauma] [2:Trauma] [N/A:N/A] Primary Etiology: [1:Diabetic Wound/Ulcer of the Lower Extremity] [2:Diabetic Wound/Ulcer of the Lower Extremity] [N/A:N/A] Secondary Etiology: [1:Trauma, Other] [2:Trauma, Other] [N/A:N/A] Comorbid History: [1:Lymphedema, Arrhythmia, Coronary Artery Disease, Hypertension, Myocardial Infarction, Type II Diabetes, Neuropathy, Received Chemotherapy, Received Radiation] [2:Lymphedema, Arrhythmia, Coronary Artery Disease, Hypertension,  Myocardial Infarction, Type II Diabetes, Neuropathy, Received Chemotherapy, Received Radiation] [N/A:N/A] Date Acquired: [1:04/05/2018] [2:12/25/2017] [N/A:N/A] Weeks of Treatment: [1:0] [2:0] [N/A:N/A] Wound Status: [1:Open] [2:Open] [N/A:N/A] Pending Amputation on [1:No] [2:Yes] [N/A:N/A] Presentation: Measurements L x W x D [1:1.9x2x0.1] [2:0.2x0.3x0.1] [N/A:N/A] (cm) Area (cm) : [1:2.985] [2:0.047] [N/A:N/A] Volume (cm) : [1:0.298] [2:0.005] [N/A:N/A] % Reduction in Area: [1:0.00%] [2:0.00%] [N/A:N/A] % Reduction in Volume: [1:0.00%] [2:0.00%] [N/A:N/A] Classification: [1:Grade 1] [2:Grade 1] [N/A:N/A] Exudate Amount: [1:Small] [2:None Present] [N/A:N/A] Exudate Type: [1:Serous] [2:N/A] [N/A:N/A] Exudate Color: [1:amber] [2:N/A] [N/A:N/A] Wound Margin: [1:Flat and Intact] [2:Flat and Intact] [N/A:N/A] Granulation  Amount: [1:Medium (34-66%)] [2:Small (1-33%)] [N/A:N/A] Granulation Quality: [1:Pink] [2:N/A] [N/A:N/A] Necrotic Amount: [1:Medium (34-66%)] [2:Large (67-100%)] [N/A:N/A] Necrotic Tissue: [1:Eschar, Adherent Slough] [2:Eschar] [N/A:N/A] Exposed Structures: [1:Fat Layer (Subcutaneous Tissue) Exposed: Yes Fascia: No] [2:Fat Layer (Subcutaneous Tissue) Exposed: Yes Fascia: No] [N/A:N/A] Tendon: No Tendon: No Muscle: No Muscle: No Joint: No Joint: No Bone: No Bone: No Epithelialization: None  Medium (34-66%) N/A Periwound Skin Texture: Excoriation: No Callus: Yes N/A Induration: No Excoriation: No Callus: No Induration: No Crepitus: No Crepitus: No Rash: No Rash: No Scarring: No Scarring: No Periwound Skin Moisture: Maceration: No Dry/Scaly: Yes N/A Dry/Scaly: No Maceration: No Periwound Skin Color: Atrophie Blanche: No Atrophie Blanche: No N/A Cyanosis: No Cyanosis: No Ecchymosis: No Ecchymosis: No Erythema: No Erythema: No Hemosiderin Staining: No Hemosiderin Staining: No Mottled: No Mottled: No Pallor: No Pallor: No Rubor: No Rubor: No Temperature: No Abnormality No Abnormality N/A Tenderness on Palpation: Yes No N/A Wound Preparation: Ulcer Cleansing: Ulcer Cleansing: N/A Rinsed/Irrigated with Saline Rinsed/Irrigated with Saline Topical Anesthetic Applied: Topical Anesthetic Applied: Other: lidocaine 4% Other: lidocaine 4% Assessment Notes: N/A Sharp pain Dr. Paulla Dolly [facility] N/A Foot care, previously seen Treatment Notes Electronic Signature(s) Signed: 04/19/2018 4:37:07 PM By: Montey Hora Entered By: Montey Hora on 04/19/2018 09:08:07 [name], [name] ([id number]) -------------------------------------------------------------------------------- [place] Details Patient Name: Marcus Lark. Date of Service: 04/19/2018 8:00 AM Medical Record Number: [id number] Patient Account Number: [id number] Date of Birth/Sex: 1951/10/25 (67 y.o. M) Treating RN: Montey Hora Primary Care Mccayla Shimada: BESS, KATY Other Clinician: Referring Kymir Coles: BESS, KATY Treating Lynessa Almanzar/Extender: STONE III, HOYT Weeks in Treatment: 0 Active Inactive ` Abuse / Safety / Falls / Self Care Management Nursing Diagnoses: Potential for falls Goals: Patient will remain injury free related to falls Date Initiated: 04/19/2018 Target Resolution Date: 05/04/2018 Goal Status: Active Interventions: Assess fall risk on admission and as  needed Notes: ` Nutrition Nursing Diagnoses: Impaired glucose control: actual or potential Goals: Patient/caregiver agrees to and verbalizes understanding of need to use nutritional supplements and/or vitamins as prescribed Date Initiated: 04/19/2018 Target Resolution Date: 06/07/2018 Goal Status: Active Patient/caregiver verbalizes understanding of need to maintain therapeutic glucose control per primary care physician Date Initiated: 04/19/2018 Target Resolution Date: 05/31/2018 Goal Status: Active Interventions: Assess HgA1c results as ordered upon admission and as needed Assess patient nutrition upon admission and as needed per policy Notes: ` Orientation to the Wound Care Program Nursing Diagnoses: Knowledge deficit related to the wound healing center program Goals: Patient/caregiver will verbalize understanding of the [place] KAJ, VASIL ([id number]) Date Initiated: 04/19/2018 Target Resolution Date: 06/29/2018 Goal Status: Active Interventions: Provide education on orientation to the wound center Notes: ` Wound/Skin Impairment Nursing Diagnoses: Impaired tissue integrity Goals: Ulcer/skin breakdown will heal within 14 weeks Date Initiated: 04/19/2018 Target Resolution Date: 06/29/2018 Goal Status: Active Interventions: Assess patient/caregiver ability to obtain necessary supplies Assess patient/caregiver ability to perform ulcer/skin care regimen upon admission and as needed Assess ulceration(s) every visit Notes: Electronic Signature(s) Signed: 04/19/2018 4:37:07 PM By: Montey Hora Entered By: Montey Hora on 04/19/2018 09:07:56 [name], [name] ([id number]) -------------------------------------------------------------------------------- Pain Assessment Details Patient Name: Marcus Loll T. Date of Service: 04/19/2018 8:00 AM Medical Record Number: [id number] Patient Account Number: [id number] Date of Birth/Sex: 1951/03/08 (67 y.o.  M) Treating RN: Roger Shelter Primary Care Mileah Hemmer: BESS, KATY Other Clinician: Referring Yasmeen Manka: BESS, KATY Treating Analysa Nutting/Extender: STONE III, HOYT Weeks in Treatment: 0 Active Problems Location of Pain Severity and Description of Pain Patient Has Paino Yes Site Locations Pain  Location: Pain in Ulcers With Dressing Change: No Duration of the Pain. Constant / Intermittento Intermittent Rate the pain. Current Pain Level: 8 Worst Pain Level: 9 Character of Pain Describe the Pain: Sharp, Shooting Pain Management and Medication Current Pain Management: Medication: No Electronic Signature(s) Signed: 04/19/2018 4:07:12 PM By: Roger Shelter Entered By: Roger Shelter on 04/19/2018 08:10:59 [place], [name] ([id number]) -------------------------------------------------------------------------------- Patient/Caregiver Education Details Patient Name: Marcus Lark. Date of Service: 04/19/2018 8:00 AM Medical Record Number: [id number] Patient Account Number: [id number] Date of Birth/Gender: 01-Oct-1951 (67 y.o. M) Treating RN: Ahmed Prima Primary Care Physician: BESS, KATY Other Clinician: Referring Physician: Lillard Anes Treating Physician/Extender: Melburn Hake, HOYT Weeks in Treatment: 0 Education Assessment Education Provided To: Patient Education Topics Provided Wound/Skin Impairment: Handouts: Caring for Your Ulcer, Other: do not get wrap wet Methods: Demonstration, Explain/Verbal Responses: State content correctly Electronic Signature(s) Signed: 04/19/2018 4:29:16 PM By: Alric Quan Entered By: Alric Quan on 04/19/2018 10:07:31 [name], [name] ([id number]) -------------------------------------------------------------------------------- Wound Assessment Details Patient Name: Marcus Loll T. Date of Service: 04/19/2018 8:00 AM Medical Record Number: [id number] Patient Account Number: [id number] Date of Birth/Sex: 12-05-1951 (67 y.o.  M) Treating RN: Roger Shelter Primary Care Joycelynn Fritsche: BESS, KATY Other Clinician: Referring Roslin Norwood: BESS, KATY Treating Omah Dewalt/Extender: STONE III, HOYT Weeks in Treatment: 0 Wound Status Wound Number: 1 Primary Diabetic Wound/Ulcer of the Lower Extremity Etiology: Wound Location: Left Lower Leg - Midline, Anterior Secondary Trauma, Other Wounding Event: Trauma Etiology: Date Acquired: 04/05/2018 Wound Open Weeks Of Treatment: 0 Status: Clustered Wound: No Comorbid Lymphedema, Arrhythmia, Coronary Artery History: Disease, Hypertension, Myocardial Infarction, Type II Diabetes, Neuropathy, Received Chemotherapy, Received Radiation Photos Photo Uploaded By: Gretta Cool, BSN, RN, CWS, Kim on 04/19/2018 16:09:30 Wound Measurements Length: (cm) 1.9 Width: (cm) 2 Depth: (cm) 0.1 Area: (cm) 2.985 Volume: (cm) 0.298 % Reduction in Area: 0% % Reduction in Volume: 0% Epithelialization: None Tunneling: No Undermining: No Wound Description Classification: Grade 1 Foul Od Wound Margin: Flat and Intact Slough/ Exudate Amount: Small Exudate Type: Serous Exudate Color: amber or After Cleansing: No Fibrino Yes Wound Bed Granulation Amount: Medium (34-66%) Exposed Structure Granulation Quality: Pink Fascia Exposed: No Necrotic Amount: Medium (34-66%) Fat Layer (Subcutaneous Tissue) Exposed: Yes Necrotic Quality: Eschar, Adherent Slough Tendon Exposed: No Muscle Exposed: No Joint Exposed: No [name], [name] ([id number]) Bone Exposed: No Periwound Skin Texture Texture Color No Abnormalities Noted: No No Abnormalities Noted: No Callus: No Atrophie Blanche: No Crepitus: No Cyanosis: No Excoriation: No Ecchymosis: No Induration: No Erythema: No Rash: No Hemosiderin Staining: No Scarring: No Mottled: No Pallor: No Moisture Rubor: No No Abnormalities Noted: No Dry / Scaly: No Temperature / Pain Maceration: No Temperature: No Abnormality Tenderness on Palpation:  Yes Wound Preparation Ulcer Cleansing: Rinsed/Irrigated with Saline Topical Anesthetic Applied: Other: lidocaine 4%, Treatment Notes Wound #1 (Left, Midline, Anterior Lower Leg) 1. Cleansed with: Clean wound with Normal Saline 2. Anesthetic Topical Lidocaine 4% cream to wound bed prior to debridement 4. Dressing Applied: Iodoflex 5. Secondary Dressing Applied ABD Pad 7. Secured with Tape 3 Layer Compression System - Left Lower Extremity Notes unna to anchor Electronic Signature(s) Signed: 04/19/2018 4:07:12 PM By: Roger Shelter Signed: 04/19/2018 4:37:07 PM By: Montey Hora Entered By: Montey Hora on 04/19/2018 09:04:22 [name], [name] ([id number]) -------------------------------------------------------------------------------- Wound Assessment Details Patient Name: Marcus Loll T. Date of Service: 04/19/2018 8:00 AM Medical Record Number: [id number] Patient Account Number: [id number] Date of Birth/Sex: [birth year] (67 y.o. M) Treating RN: Roger Shelter Primary Care Levoy Geisen: Lillard Anes Other Clinician:  Referring Guiseppe Flanagan: BESS, KATY Treating Rowyn Mustapha/Extender: STONE III, HOYT Weeks in Treatment: 0 Wound Status Wound Number: 2 Primary Diabetic Wound/Ulcer of the Lower Extremity Etiology: Wound Location: Left Toe Great Secondary Trauma, Other Wounding Event: Trauma Etiology: Date Acquired: 12/25/2017 Wound Open Weeks Of Treatment: 0 Status: Clustered Wound: No Comorbid Lymphedema, Arrhythmia, Coronary Artery Pending Amputation On Presentation History: Disease, Hypertension, Myocardial Infarction, Type II Diabetes, Neuropathy, Received Chemotherapy, Received Radiation Photos Photo Uploaded By: Gretta Cool, BSN, RN, CWS, Kim on 04/19/2018 16:09:31 Wound Measurements Length: (cm) 0.2 Width: (cm) 0.3 Depth: (cm) 0.1 Area: (cm) 0.047 Volume: (cm) 0.005 % Reduction in Area: 0% % Reduction in Volume: 0% Epithelialization: Medium (34-66%) Tunneling:  No Undermining: No Wound Description Classification: Grade 1 Foul Odor Wound Margin: Flat and Intact Slough/Fi Exudate Amount: None Present After Cleansing: No brino No Wound Bed Granulation Amount: Small (1-33%) Exposed Structure Necrotic Amount: Large (67-100%) Fascia Exposed: No Necrotic Quality: Eschar Fat Layer (Subcutaneous Tissue) Exposed: Yes Tendon Exposed: No Muscle Exposed: No Joint Exposed: No Bone Exposed: No [place], Ehsan T. ([id number]) Periwound Skin Texture Texture Color No Abnormalities Noted: No No Abnormalities Noted: No Callus: Yes Atrophie Blanche: No Crepitus: No Cyanosis: No Excoriation: No Ecchymosis: No Induration: No Erythema: No Rash: No Hemosiderin Staining: No Scarring: No Mottled: No Pallor: No Moisture Rubor: No No Abnormalities Noted: No Dry / Scaly: Yes Temperature / Pain Maceration: No Temperature: No Abnormality Wound Preparation Ulcer Cleansing: Rinsed/Irrigated with Saline Topical Anesthetic Applied: Other: lidocaine 4%, Assessment Notes Sharp pain Dr. Paulla Dolly [facility] Foot care, previously seen Treatment Notes Wound #2 (Left Toe Great) 1. Cleansed with: Clean wound with Normal Saline 2. Anesthetic Topical Lidocaine 4% cream to wound bed prior to debridement 4. Dressing Applied: Iodoflex 5. Secondary Dressing Applied Dry Gauze Kerlix/Conform 7. Secured with Tape Notes Event organiser) Signed: 04/19/2018 4:07:12 PM By: Roger Shelter Signed: 04/19/2018 4:37:07 PM By: Montey Hora Entered By: Montey Hora on 04/19/2018 09:04:52 [place], [name] ([id number]) -------------------------------------------------------------------------------- [place] Details Patient Name: Marcus Lark. Date of Service: 04/19/2018 8:00 AM Medical Record Number: [id number] Patient Account Number: [id number] Date of Birth/Sex: [birth year] (67 y.o. M) Treating RN: Roger Shelter Primary Care Rayden Dock: BESS, KATY Other  Clinician: Referring Gurjit Loconte: BESS, KATY Treating Zakariyya Helfman/Extender: STONE III, HOYT Weeks in Treatment: 0 Vital Signs Time Taken: 08:11 Temperature (F): 97.7 Height (in): 69 Pulse (bpm): 80 Source: Stated Respiratory Rate (breaths/min): 18 Weight (lbs): 238 Blood Pressure (mmHg): 128/64 Source: Stated Reference Range: 80 - 120 mg / dl Body Mass Index (BMI): 35.1 Electronic Signature(s) Signed: 04/19/2018 4:07:12 PM By: Roger Shelter Entered By: Roger Shelter on 04/19/2018 08:11:44

## 2018-04-23 NOTE — Progress Notes (Signed)
LAZER, WOLLARD ([id number]) Visit Report for 04/19/2018 Chief Complaint Document Details Patient Name: [name], [name]. Date of Service: 04/19/2018 8:00 AM Medical Record Number: [id number] Patient Account Number: [id number] Date of Birth/Sex: Jun 10, 1951 (67 y.o. M) Treating RN: Primary Care Provider: Lillard Anes Other Clinician: Referring Provider: BESS, KATY Treating Provider/Extender: STONE III, HOYT Weeks in Treatment: 0 Information Obtained from: Patient Chief Complaint Left lower leg and left great toe ulcer Electronic Signature(s) Signed: 04/19/2018 5:37:52 PM By: Worthy Keeler PA-C Entered By: Worthy Keeler on 04/19/2018 09:01:49 Delcarlo, Huntley Dec ([id number]) -------------------------------------------------------------------------------- Debridement Details Patient Name: [name]. Date of Service: 04/19/2018 8:00 AM Medical Record Number: [id number] Patient Account Number: [id number] Date of Birth/Sex: October 31, 1951 (67 y.o. M) Treating RN: Montey Hora Primary Care Provider: BESS, KATY Other Clinician: Referring Provider: BESS, KATY Treating Provider/Extender: STONE III, HOYT Weeks in Treatment: 0 Debridement Performed for Wound #2 Left Toe Great Assessment: Performed By: Physician STONE III, HOYT E., PA-C Debridement Type: Debridement Severity of Tissue Pre Fat layer exposed Debridement: Pre-procedure Verification/Time Yes - 09:14 Out Taken: Start Time: 09:14 Pain Control: Lidocaine 4% Topical Solution Total Area Debrided (L x W): 0.2 (cm) x 0.3 (cm) = 0.06 (cm) Tissue and other material Viable, Non-Viable, Callus, Slough, Subcutaneous, Biofilm, Slough debrided: Level: Skin/Subcutaneous Tissue Debridement Description: Excisional Instrument: Curette Bleeding: Minimum Hemostasis Achieved: Pressure End Time: 09:19 Procedural Pain: 0 Post Procedural Pain: 0 Response to Treatment: Procedure was tolerated well Post Debridement Measurements of Total  Wound Length: (cm) 0.4 Width: (cm) 0.4 Depth: (cm) 0.2 Volume: (cm) 0.025 Character of Wound/Ulcer Post Debridement: Improved Severity of Tissue Post Debridement: Fat layer exposed Post Procedure Diagnosis Same as Pre-procedure Electronic Signature(s) Signed: 04/19/2018 4:37:07 PM By: Montey Hora Signed: 04/19/2018 5:37:52 PM By: Worthy Keeler PA-C Entered By: Montey Hora on 04/19/2018 09:20:03 Roen, Huntley Dec ([id number]) -------------------------------------------------------------------------------- Debridement Details Patient Name: [name]. Date of Service: 04/19/2018 8:00 AM Medical Record Number: [id number] Patient Account Number: [id number] Date of Birth/Sex: 1951/06/15 (67 y.o. M) Treating RN: Montey Hora Primary Care Provider: BESS, KATY Other Clinician: Referring Provider: BESS, KATY Treating Provider/Extender: STONE III, HOYT Weeks in Treatment: 0 Debridement Performed for Wound #1 Left,Midline,Anterior Lower Leg Assessment: Performed By: Physician STONE III, HOYT E., PA-C Debridement Type: Debridement Severity of Tissue Pre Fat layer exposed Debridement: Pre-procedure Verification/Time Yes - 09:20 Out Taken: Start Time: 09:20 Pain Control: Lidocaine 4% Topical Solution Total Area Debrided (L x W): 1.9 (cm) x 2 (cm) = 3.8 (cm) Tissue and other material Viable, Non-Viable, Slough, Subcutaneous, Biofilm, Slough debrided: Level: Skin/Subcutaneous Tissue Debridement Description: Excisional Instrument: Curette Bleeding: Minimum Hemostasis Achieved: Pressure End Time: 09:23 Procedural Pain: 0 Post Procedural Pain: 0 Response to Treatment: Procedure was tolerated well Post Debridement Measurements of Total Wound Length: (cm) 1.9 Width: (cm) 2 Depth: (cm) 0.2 Volume: (cm) 0.597 Character of Wound/Ulcer Post Debridement: Improved Severity of Tissue Post Debridement: Fat layer exposed Post Procedure Diagnosis Same as  Pre-procedure Electronic Signature(s) Signed: 04/19/2018 4:37:07 PM By: Montey Hora Signed: 04/19/2018 5:37:52 PM By: Worthy Keeler PA-C Entered By: Montey Hora on 04/19/2018 09:23:30 Neace, Huntley Dec ([id number]) -------------------------------------------------------------------------------- HPI Details Patient Name: [name]. Date of Service: 04/19/2018 8:00 AM Medical Record Number: [id number] Patient Account Number: [id number] Date of Birth/Sex: [birth year] (67 y.o. M) Treating RN: Primary Care Provider: Lillard Anes Other Clinician: Referring Provider: BESS, KATY Treating Provider/Extender: STONE III, HOYT Weeks in Treatment: 0 History of Present Illness Associated Signs and Symptoms: Patient has a  history of chronic venous insufficiency, diabetes mellitus type II, its reform fraction, hypertension HPI Description: 04/19/18 on evaluation today patient presents initially concerning an ulcer of his left great toe and left anterior shin. He states that the shin was definitely a trauma injury where he struck this on a metal pole injuring leg. In regard to the distal left great toe he's really not sure what happened here although he does have a significant callous noted as well. He does have a pacemaker. Fortunately his ABI was 0.98 on the right and 0.83 on the left this appears to be doing fairly well. Overall I'm pleased with that. Nonetheless he states that this really has not seem to be improving. He has been tolerating the dressing changes without complication. The left anterior leg ulcer began on 04/05/18 according to what he tells [place] today although one note I did have for review said it was April 5. I'm inclined to believe it was the fifth she did have an office visit on 04/01/18 with his family nurse practitioner Faith Rogue. Nonetheless either way it's been going on this month for several weeks. The left great toe has actually been present since January 1 he does not know  how this occurred. Fortunately has no pain at the site although he does have pain on the left anterior shin. His hemoglobin A1c is 8.0 in March 2019. Electronic Signature(s) Signed: 04/19/2018 5:37:52 PM By: Worthy Keeler PA-C Entered By: Worthy Keeler on 04/19/2018 17:34:40 Beigel, Huntley Dec ([id number]) -------------------------------------------------------------------------------- Physical Exam Details Patient Name: [name]. Date of Service: 04/19/2018 8:00 AM Medical Record Number: [id number] Patient Account Number: [id number] Date of Birth/Sex: 01/26/1951 (67 y.o. M) Treating RN: Primary Care Provider: Lillard Anes Other Clinician: Referring Provider: BESS, KATY Treating Provider/Extender: STONE III, HOYT Weeks in Treatment: 0 Constitutional sitting or standing blood pressure is within target range for patient.. pulse regular and within target range for patient.Marland Kitchen respirations regular, non-labored and within target range for patient.Marland Kitchen temperature within target range for patient.. Well- nourished and well-hydrated in no acute distress. Eyes conjunctiva clear no eyelid edema noted. pupils equal round and reactive to light and accommodation. Respiratory normal breathing without difficulty. clear to auscultation bilaterally. Cardiovascular regular rate and rhythm with normal S1, S2. 1+ dorsalis pedis/posterior tibialis pulses. 2+ pitting edema of the bilateral lower extremities. Gastrointestinal (GI) soft, non-tender, non-distended, +BS. no ventral hernia noted. Musculoskeletal normal gait and posture. no significant deformity or arthritic changes, no loss or range of motion, no clubbing. Psychiatric this patient is able to make decisions and demonstrates good insight into disease process. Alert and Oriented x 3. pleasant and cooperative. Notes Patient's wound in regard to the left first toe did reveal significant amount of callous that did require sharp debridement  today. He has been tolerating the dressings without complication and the debridement really was not a problem here. Patient is debridement in regard to the left anterior shin actually was a little bit more uncomfortable for him he was able to tolerate this. Fortunately post debridement the wound appear to be doing much better. Electronic Signature(s) Signed: 04/19/2018 5:37:52 PM By: Worthy Keeler PA-C Entered By: Worthy Keeler on 04/19/2018 17:35:56 Shugars, Huntley Dec ([id number]) -------------------------------------------------------------------------------- Physician Orders Details Patient Name: [name]. Date of Service: 04/19/2018 8:00 AM Medical Record Number: [id number] Patient Account Number: [id number] Date of Birth/Sex: August 23, 1951 (67 y.o. M) Treating RN: Montey Hora Primary Care Provider: Lillard Anes Other Clinician: Referring Provider: BESS, KATY Treating Provider/Extender:  STONE III, HOYT Weeks in Treatment: 0 Verbal / Phone Orders: No Diagnosis Coding ICD-10 Coding Code Description I87.2 Venous insufficiency (chronic) (peripheral) S81.812A Laceration without foreign body, left lower leg, initial encounter [phone] Non-pressure chronic ulcer of other part of left lower leg with fat layer exposed L97.521 Non-pressure chronic ulcer of other part of left foot limited to breakdown of skin E11.621 Type 2 diabetes mellitus with foot ulcer I63.9 Cerebral infarction, unspecified I10 Essential (primary) hypertension Wound Cleansing Wound #2 Left Toe Great o Cleanse wound with mild soap and water o May Shower, gently pat wound dry prior to applying new dressing. Wound #1 Left,Midline,Anterior Lower Leg o Cleanse wound with mild soap and water o May shower with protection. Anesthetic (add to Medication List) Wound #1 Left,Midline,Anterior Lower Leg o Topical Lidocaine 4% cream applied to wound bed prior to debridement (In Clinic Only). Wound #2 Left Toe  Great o Topical Lidocaine 4% cream applied to wound bed prior to debridement (In Clinic Only). Primary Wound Dressing Wound #1 Left,Midline,Anterior Lower Leg o Iodoflex Wound #2 Left Toe Great o Iodoflex Secondary Dressing Wound #1 Left,Midline,Anterior Lower Leg o ABD pad Wound #2 Left Toe Great o Gauze and Kerlix/Conform - secure with coban Dressing Change Frequency Feely, Huntley Dec ([id number]) Wound #1 Left,Midline,Anterior Lower Leg o Change dressing every week Wound #2 Left Toe Great o Change dressing every other day. Follow-up Appointments Wound #1 Left,Midline,Anterior Lower Leg o Return Appointment in 1 week. Wound #2 Left Toe Great o Return Appointment in 1 week. Edema Control Wound #1 Left,Midline,Anterior Lower Leg o 3 Layer Compression System - Left Lower Extremity o Patient to wear own compression stockings - right lower leg o Elevate legs to the level of the heart and pump ankles as often as possible Additional Orders / Instructions Wound #1 Left,Midline,Anterior Lower Leg o Stop Smoking o Vitamin A; Vitamin C, Zinc - Please add a multivitamin with 100% of these o Increase protein intake. o Activity as tolerated Wound #2 Left Toe Great o Stop Smoking o Vitamin A; Vitamin C, Zinc - Please add a multivitamin with 100% of these o Increase protein intake. o Activity as tolerated Electronic Signature(s) Signed: 04/19/2018 4:37:07 PM By: Montey Hora Signed: 04/19/2018 5:37:52 PM By: Worthy Keeler PA-C Entered By: Montey Hora on 04/19/2018 09:27:07 Holster, Huntley Dec ([id number]) -------------------------------------------------------------------------------- Problem List Details Patient Name: [name]. Date of Service: 04/19/2018 8:00 AM Medical Record Number: [id number] Patient Account Number: [id number] Date of Birth/Sex: Dec 22, 1951 (67 y.o. M) Treating RN: Primary Care Provider: Lillard Anes Other  Clinician: Referring Provider: BESS, KATY Treating Provider/Extender: STONE III, HOYT Weeks in Treatment: 0 Active Problems ICD-10 Impacting Encounter Code Description Active Date Wound Healing Diagnosis I87.2 Venous insufficiency (chronic) (peripheral) 04/19/2018 Yes H41.740C Laceration without foreign body, left lower leg, initial 04/19/2018 Yes encounter L97.822 Non-pressure chronic ulcer of other part of left lower leg with 04/19/2018 Yes fat layer exposed L97.521 Non-pressure chronic ulcer of other part of left foot limited to 04/19/2018 Yes breakdown of skin E11.621 Type 2 diabetes mellitus with foot ulcer 04/19/2018 Yes I63.9 Cerebral infarction, unspecified 04/19/2018 Yes I10 Essential (primary) hypertension 04/19/2018 Yes Inactive Problems Resolved Problems Electronic Signature(s) Signed: 04/19/2018 5:37:52 PM By: Worthy Keeler PA-C Entered By: Worthy Keeler on 04/19/2018 08:54:00 Wiggs, Huntley Dec ([id number]) -------------------------------------------------------------------------------- Progress Note Details Patient Name: [name]. Date of Service: 04/19/2018 8:00 AM Medical Record Number: [id number] Patient Account Number: [id number] Date of Birth/Sex: 03/04/1951 (67 y.o. M) Treating RN: Primary  Care Provider: BESS, KATY Other Clinician: Referring Provider: BESS, KATY Treating Provider/Extender: STONE III, HOYT Weeks in Treatment: 0 Subjective Chief Complaint Information obtained from Patient Left lower leg and left great toe ulcer History of Present Illness (HPI) The following HPI elements were documented for the patient's wound: Associated Signs and Symptoms: Patient has a history of chronic venous insufficiency, diabetes mellitus type II, its reform fraction, hypertension 04/19/18 on evaluation today patient presents initially concerning an ulcer of his left great toe and left anterior shin. He states that the shin was definitely a trauma injury where he  struck this on a metal pole injuring leg. In regard to the distal left great toe he's really not sure what happened here although he does have a significant callous noted as well. He does have a pacemaker. Fortunately his ABI was 0.98 on the right and 0.83 on the left this appears to be doing fairly well. Overall I'm pleased with that. Nonetheless he states that this really has not seem to be improving. He has been tolerating the dressing changes without complication. The left anterior leg ulcer began on 04/05/18 according to what he tells [place] today although one note I did have for review said it was April 5. I'm inclined to believe it was the fifth she did have an office visit on 04/01/18 with his family nurse practitioner Faith Rogue. Nonetheless either way it's been going on this month for several weeks. The left great toe has actually been present since January 1 he does not know how this occurred. Fortunately has no pain at the site although he does have pain on the left anterior shin. His hemoglobin A1c is 8.0 in March 2019. Wound History Patient presents with 2 open wounds that have been present for approximately 2 weeks. Patient has been treating wounds in the following manner: yes Doctor. Laboratory tests have not been performed in the last month. Patient reportedly has not tested positive for an antibiotic resistant organism. Patient reportedly has not tested positive for osteomyelitis. Patient reportedly has not had testing performed to evaluate circulation in the legs. Patient experiences the following problems associated with their wounds: infection, swelling. Patient History Information obtained from Patient. Allergies bee venom protein (honey bee), penicillin, oyster shell Family History Cancer - Father, Diabetes - Father, Heart Disease - Mother,Father, Hypertension - Mother, Kidney Disease - Child, No family history of Lung Disease, Seizures, Stroke, Thyroid Problems,  Tuberculosis. Social History Current every day smoker - 1 - 1/2 pack daily, Marital Status - Married, Alcohol Use - Rarely, Drug Use - No History, Caffeine Use - Daily - coffee. Medical History Eyes Denies history of Cataracts, Glaucoma, Optic Neuritis Payes, TREAVON CASTILLEJA ([id number]) Ear/Nose/Mouth/Throat Denies history of Chronic sinus problems/congestion Hematologic/Lymphatic Patient has history of Lymphedema Denies history of Anemia, Hemophilia, Human Immunodeficiency Virus, Sickle Cell Disease Respiratory Denies history of Aspiration, Asthma, Chronic Obstructive Pulmonary Disease (COPD), Pneumothorax, Sleep Apnea, Tuberculosis Cardiovascular Patient has history of Arrhythmia - pacemaker, Coronary Artery Disease, Hypertension, Myocardial Infarction Denies history of Deep Vein Thrombosis, Hypotension, Peripheral Arterial Disease, Peripheral Venous Disease, Phlebitis, Vasculitis Gastrointestinal Denies history of Cirrhosis , Colitis, Crohn s, Hepatitis A, Hepatitis B, Hepatitis C Endocrine Patient has history of Type II Diabetes Denies history of Type I Diabetes Genitourinary Denies history of End Stage Renal Disease Immunological Denies history of Lupus Erythematosus, Raynaud s, Scleroderma Integumentary (Skin) Denies history of History of Burn, History of pressure wounds Musculoskeletal Denies history of Gout, Rheumatoid Arthritis, Osteoarthritis, Osteomyelitis Neurologic Patient has  history of Neuropathy Denies history of Dementia, Quadriplegia, Paraplegia, Seizure Disorder Oncologic Patient has history of Received Chemotherapy, Received Radiation - Seeds prostate Psychiatric Denies history of Anorexia/bulimia, Confinement Anxiety Patient is treated with Oral Agents. Blood sugar is tested. Blood sugar results noted at the following times: Breakfast - 174. Review of Systems (ROS) Constitutional Symptoms (General Health) Denies complaints or symptoms of Fatigue, Fever,  Chills, Marked Weight Change. Eyes Complains or has symptoms of Glasses / Contacts. Denies complaints or symptoms of Dry Eyes, Vision Changes. Ear/Nose/Mouth/Throat Denies complaints or symptoms of Difficult clearing ears, Sinusitis. Hematologic/Lymphatic Denies complaints or symptoms of Bleeding / Clotting Disorders, Human Immunodeficiency Virus. Respiratory Denies complaints or symptoms of Chronic or frequent coughs, Shortness of Breath. Cardiovascular Complains or has symptoms of LE edema. Denies complaints or symptoms of Chest pain, coronary bypass 10 years ago Gastrointestinal Denies complaints or symptoms of Frequent diarrhea, Nausea, Vomiting. Endocrine Denies complaints or symptoms of Hepatitis, Thyroid disease, Polydypsia (Excessive Thirst). Genitourinary Complains or has symptoms of Incontinence/dribbling. Denies complaints or symptoms of Kidney failure/ Dialysis. Immunological Denies complaints or symptoms of Hives, Itching. Integumentary (Skin) Seivert, Nazario [name]. ([id number]) Complains or has symptoms of Wounds, Bleeding or bruising tendency, Swelling. Musculoskeletal Denies complaints or symptoms of Muscle Pain, Muscle Weakness. Neurologic Complains or has symptoms of Numbness/parasthesias - feet. Denies complaints or symptoms of Focal/Weakness. Oncologic Prostate seeds implanted Psychiatric Complains or has symptoms of Anxiety. Denies complaints or symptoms of Claustrophobia. Objective Constitutional sitting or standing blood pressure is within target range for patient.. pulse regular and within target range for patient.Marland Kitchen respirations regular, non-labored and within target range for patient.Marland Kitchen temperature within target range for patient.. Well- nourished and well-hydrated in no acute distress. Vitals Time Taken: 8:11 AM, Height: 69 in, Source: Stated, Weight: 238 lbs, Source: Stated, BMI: 35.1, Temperature: 97.7 F, Pulse: 80 bpm, Respiratory Rate: 18 breaths/min,  Blood Pressure: 128/64 mmHg. Eyes conjunctiva clear no eyelid edema noted. pupils equal round and reactive to light and accommodation. Respiratory normal breathing without difficulty. clear to auscultation bilaterally. Cardiovascular regular rate and rhythm with normal S1, S2. 1+ dorsalis pedis/posterior tibialis pulses. 2+ pitting edema of the bilateral lower extremities. Gastrointestinal (GI) soft, non-tender, non-distended, +BS. no ventral hernia noted. Musculoskeletal normal gait and posture. no significant deformity or arthritic changes, no loss or range of motion, no clubbing. Psychiatric this patient is able to make decisions and demonstrates good insight into disease process. Alert and Oriented x 3. pleasant and cooperative. General Notes: Patient's wound in regard to the left first toe did reveal significant amount of callous that did require sharp debridement today. He has been tolerating the dressings without complication and the debridement really was not a problem here. Patient is debridement in regard to the left anterior shin actually was a little bit more uncomfortable for him he was able to tolerate this. Fortunately post debridement the wound appear to be doing much better. Integumentary (Hair, Skin) Wound #1 status is Open. Original cause of wound was Trauma. The wound is located on the [employer], [place] [name]. ([id number]) Leg. The wound measures 1.9cm length x 2cm width x 0.1cm depth; 2.985cm^2 area and 0.298cm^3 volume. There is Fat Layer (Subcutaneous Tissue) Exposed exposed. There is no tunneling or undermining noted. There is a small amount of serous drainage noted. The wound margin is flat and intact. There is medium (34-66%) pink granulation within the wound bed. There is a medium (34-66%) amount of necrotic tissue within the wound bed including Eschar and  Adherent Slough. The periwound skin appearance did not exhibit: Callus, Crepitus,  Excoriation, Induration, Rash, Scarring, Dry/Scaly, Maceration, Atrophie Blanche, Cyanosis, Ecchymosis, Hemosiderin Staining, Mottled, Pallor, Rubor, Erythema. Periwound temperature was noted as No Abnormality. The periwound has tenderness on palpation. Wound #2 status is Open. Original cause of wound was Trauma. The wound is located on the Left Toe Great. The wound measures 0.2cm length x 0.3cm width x 0.1cm depth; 0.047cm^2 area and 0.005cm^3 volume. There is Fat Layer (Subcutaneous Tissue) Exposed exposed. There is no tunneling or undermining noted. There is a none present amount of drainage noted. The wound margin is flat and intact. There is small (1-33%) granulation within the wound bed. There is a large (67-100%) amount of necrotic tissue within the wound bed including Eschar. The periwound skin appearance exhibited: Callus, Dry/Scaly. The periwound skin appearance did not exhibit: Crepitus, Excoriation, Induration, Rash, Scarring, Maceration, Atrophie Blanche, Cyanosis, Ecchymosis, Hemosiderin Staining, Mottled, Pallor, Rubor, Erythema. Periwound temperature was noted as No Abnormality. General Notes: Sharp pain Dr. Paulla D[facility], previously seen Assessment Active Problems ICD-10 I87.2 - Venous insufficiency (chronic) (peripheral) K93.267T - Laceration without foreign body, left lower leg, initial encounter L97.822 - Non-pressure chronic ulcer of other part of left lower leg with fat layer exposed L97.521 - Non-pressure chronic ulcer of other part of left foot limited to breakdown of skin E11.621 - Type 2 diabetes mellitus with foot ulcer I63.9 - Cerebral infarction, unspecified I10 - Essential (primary) hypertension Procedures Wound #1 Pre-procedure diagnosis of Wound #1 is a Diabetic Wound/Ulcer of the Lower Extremity located on the Left,Midline,Anterior Lower Leg .Severity of Tissue Pre Debridement is: Fat layer exposed. There was a Excisional Skin/Subcutaneous  Tissue Debridement with a total area of 3.8 sq cm performed by STONE III, HOYT E., PA-C. With the following instrument(s): Curette. to remove Viable and Non-Viable tissue/material Material removed includes Subcutaneous Tissue, and Slough, Biofilm, and Slough after achieving pain control using Lidocaine 4% Topical Solution. No specimens were taken. A time out was conducted at 09:20, prior to the start of the procedure. A Minimum amount of bleeding was controlled with Pressure. The procedure was tolerated well with a pain level of 0 throughout and a pain level of 0 following the procedure. Post Debridement Measurements: 1.9cm length x 2cm width x 0.2cm depth; 0.597cm^3 volume. Character of Wound/Ulcer Post Debridement is improved. Severity of Tissue Post Debridement is: Fat layer exposed. Post procedure Diagnosis Wound #1: Same as Pre-Procedure Wound #2 Pre-procedure diagnosis of Wound #2 is a Diabetic Wound/Ulcer of the Lower Extremity located on the Left Toe Great .Severity of Tissue Pre Debridement is: Fat layer exposed. There was a Excisional Skin/Subcutaneous Tissue Debridement with a total area of 0.06 sq cm performed by STONE III, HOYT E., PA-C. With the following instrument(s): Metayer, LAITHAN CONCHAS. ([id number]) Curette. to remove Viable and Non-Viable tissue/material Material removed includes Callus, Subcutaneous Tissue, and Slough, Biofilm, and Slough after achieving pain control using Lidocaine 4% Topical Solution. No specimens were taken. A time out was conducted at 09:14, prior to the start of the procedure. A Minimum amount of bleeding was controlled with Pressure. The procedure was tolerated well with a pain level of 0 throughout and a pain level of 0 following the procedure. Post Debridement Measurements: 0.4cm length x 0.4cm width x 0.2cm depth; 0.025cm^3 volume. Character of Wound/Ulcer Post Debridement is improved. Severity of Tissue Post Debridement is: Fat layer exposed. Post  procedure Diagnosis Wound #2: Same as Pre-Procedure Plan Wound Cleansing: Wound #2 Left  Toe Great: Cleanse wound with mild soap and water May Shower, gently pat wound dry prior to applying new dressing. Wound #1 Left,Midline,Anterior Lower Leg: Cleanse wound with mild soap and water May shower with protection. Anesthetic (add to Medication List): Wound #1 Left,Midline,Anterior Lower Leg: Topical Lidocaine 4% cream applied to wound bed prior to debridement (In Clinic Only). Wound #2 Left Toe Great: Topical Lidocaine 4% cream applied to wound bed prior to debridement (In Clinic Only). Primary Wound Dressing: Wound #1 Left,Midline,Anterior Lower Leg: Iodoflex Wound #2 Left Toe Great: Iodoflex Secondary Dressing: Wound #1 Left,Midline,Anterior Lower Leg: ABD pad Wound #2 Left Toe Great: Gauze and Kerlix/Conform - secure with coban Dressing Change Frequency: Wound #1 Left,Midline,Anterior Lower Leg: Change dressing every week Wound #2 Left Toe Great: Change dressing every other day. Follow-up Appointments: Wound #1 Left,Midline,Anterior Lower Leg: Return Appointment in 1 week. Wound #2 Left Toe Great: Return Appointment in 1 week. Edema Control: Wound #1 Left,Midline,Anterior Lower Leg: 3 Layer Compression System - Left Lower Extremity Patient to wear own compression stockings - right lower leg Elevate legs to the level of the heart and pump ankles as often as possible Additional Orders / Instructions: Wound #1 Left,Midline,Anterior Lower Leg: Stop Smoking Vitamin A; Vitamin C, Zinc - Please add a multivitamin with 100% of these Increase protein intake. Activity as tolerated Wound #2 Left Toe Great: RIELLY, CORLETT ([id number]) Stop Smoking Vitamin A; Vitamin C, Zinc - Please add a multivitamin with 100% of these Increase protein intake. Activity as tolerated I'm gonna suggest at this time that we go ahead and initiate treatment per above for his wounds. Fortunately  post debridement both look better and I'm hoping that the compression wrap will also be of benefit for him. He's in agreement this plan. We will see were things stand in one weeks time. I do think he's gonna need ongoing compression although this is something that we will have to discuss further due to the fact that he somewhat reluctant to do this I do believe though it's gonna be very necessary for him and is important part of his treatment plan. We will have further discussions in the future and will work with him to try to get appropriate compression stockings of one type or another. Please see above for specific wound care orders. We will see patient for re-evaluation in 1 week(s) here in the clinic. If anything worsens or changes patient will contact our office for additional recommendations. Electronic Signature(s) Signed: 04/19/2018 5:37:52 PM By: Worthy Keeler PA-C Entered By: Worthy Keeler on 04/19/2018 17:36:56 Tuggle, Huntley Dec ([id number]) -------------------------------------------------------------------------------- ROS/PFSH Details Patient Name: [name]. Date of Service: 04/19/2018 8:00 AM Medical Record Number: [id number] Patient Account Number: [id number] Date of Birth/Sex: [birth year] (67 y.o. M) Treating RN: Roger Shelter Primary Care Provider: BESS, KATY Other Clinician: Referring Provider: BESS, KATY Treating Provider/Extender: STONE III, HOYT Weeks in Treatment: 0 Information Obtained From Patient Wound History Do you currently have one or more open woundso Yes How many open wounds do you currently haveo 2 Approximately how long have you had your woundso 2 weeks How have you been treating your wound(s) until nowo yes Doctor Has your wound(s) ever healed and then re-openedo No Have you had any lab work done in the past montho No Have you tested positive for an antibiotic resistant organism (MRSA, VRE)o No Have you tested positive for osteomyelitis  (bone infection)o No Have you had any tests for circulation on your legso No Have  you had other problems associated with your woundso Infection, Swelling Constitutional Symptoms (General Health) Complaints and Symptoms: Negative for: Fatigue; Fever; Chills; Marked Weight Change Eyes Complaints and Symptoms: Positive for: Glasses / Contacts Negative for: Dry Eyes; Vision Changes Medical History: Negative for: Cataracts; Glaucoma; Optic Neuritis Ear/Nose/Mouth/Throat Complaints and Symptoms: Negative for: Difficult clearing ears; Sinusitis Medical History: Negative for: Chronic sinus problems/congestion Hematologic/Lymphatic Complaints and Symptoms: Negative for: Bleeding / Clotting Disorders; Human Immunodeficiency Virus Medical History: Positive for: Lymphedema Negative for: Anemia; Hemophilia; Human Immunodeficiency Virus; Sickle Cell Disease Respiratory Complaints and Symptoms: Negative for: Chronic or frequent coughs; Shortness of Breath ABED, SCHAR ([id number]) Medical History: Negative for: Aspiration; Asthma; Chronic Obstructive Pulmonary Disease (COPD); Pneumothorax; Sleep Apnea; Tuberculosis Cardiovascular Complaints and Symptoms: Positive for: LE edema Negative for: Chest pain Review of System Notes: coronary bypass 10 years ago Medical History: Positive for: Arrhythmia - pacemaker; Coronary Artery Disease; Hypertension; Myocardial Infarction Negative for: Deep Vein Thrombosis; Hypotension; Peripheral Arterial Disease; Peripheral Venous Disease; Phlebitis; Vasculitis Gastrointestinal Complaints and Symptoms: Negative for: Frequent diarrhea; Nausea; Vomiting Medical History: Negative for: Cirrhosis ; Colitis; Crohnos; Hepatitis A; Hepatitis B; Hepatitis C Endocrine Complaints and Symptoms: Negative for: Hepatitis; Thyroid disease; Polydypsia (Excessive Thirst) Medical History: Positive for: Type II Diabetes Negative for: Type I Diabetes Time with  diabetes: 5 years Treated with: Oral agents Blood sugar tested every day: Yes Tested : daily AM Blood sugar testing results: Breakfast: 174 Genitourinary Complaints and Symptoms: Positive for: Incontinence/dribbling Negative for: Kidney failure/ Dialysis Medical History: Negative for: End Stage Renal Disease Immunological Complaints and Symptoms: Negative for: Hives; Itching Medical History: Negative for: Lupus Erythematosus; Raynaudos; Scleroderma Integumentary (Skin) Pavlock, HASAAN RADDE. ([id number]) Complaints and Symptoms: Positive for: Wounds; Bleeding or bruising tendency; Swelling Medical History: Negative for: History of Burn; History of pressure wounds Musculoskeletal Complaints and Symptoms: Negative for: Muscle Pain; Muscle Weakness Medical History: Negative for: Gout; Rheumatoid Arthritis; Osteoarthritis; Osteomyelitis Neurologic Complaints and Symptoms: Positive for: Numbness/parasthesias - feet Negative for: Focal/Weakness Medical History: Positive for: Neuropathy Negative for: Dementia; Quadriplegia; Paraplegia; Seizure Disorder Psychiatric Complaints and Symptoms: Positive for: Anxiety Negative for: Claustrophobia Medical History: Negative for: Anorexia/bulimia; Confinement Anxiety Oncologic Complaints and Symptoms: Review of System Notes: Prostate seeds implanted Medical History: Positive for: Received Chemotherapy; Received Radiation - Seeds prostate Immunizations Pneumococcal Vaccine: Received Pneumococcal Vaccination: Yes Tetanus Vaccine: Last tetanus shot: 12/25/2017 Implantable Devices Family and Social History Cancer: Yes - Father; Diabetes: Yes - Father; Heart Disease: Yes - Mother,Father; Hypertension: Yes - Mother; Kidney Disease: Yes - Child; Lung Disease: No; Seizures: No; Stroke: No; Thyroid Problems: No; Tuberculosis: No; Current every day smoker - 1 - 1/2 pack daily; Marital Status - Married; Alcohol Use: Rarely; Drug Use: No History;  Caffeine Use: Daily - coffee; Financial Concerns: No; Food, Clothing or Shelter Needs: No; Support System Lacking: No; Transportation Concerns: No Electronic Signature(s) ZADRIAN, MCCAULEY ([id number]) Signed: 04/19/2018 4:07:12 PM By: Roger Shelter Signed: 04/19/2018 5:37:52 PM By: Worthy Keeler PA-C Entered By: Roger Shelter on 04/19/2018 08:31:57 Myszka, Huntley Dec ([id number]) -------------------------------------------------------------------------------- SuperBill Details Patient Name: [name]. Date of Service: 04/19/2018 Medical Record Number: [id number] Patient Account Number: [id number] Date of Birth/Sex: 07/28/1951 (67 y.o. M) Treating RN: Primary Care Provider: Lillard Anes Other Clinician: Referring Provider: BESS, KATY Treating Provider/Extender: STONE III, HOYT Weeks in Treatment: 0 Diagnosis Coding ICD-10 Codes Code Description I87.2 Venous insufficiency (chronic) (peripheral) J82.505L Laceration without foreign body, left lower leg, initial encounter L97.822 Non-pressure chronic ulcer of other part of  left lower leg with fat layer exposed L97.521 Non-pressure chronic ulcer of other part of left foot limited to breakdown of skin E11.621 Type 2 diabetes mellitus with foot ulcer I63.9 Cerebral infarction, unspecified I10 Essential (primary) hypertension Facility Procedures CPT4 Code Description: 35686168 99213 - WOUND CARE VISIT-LEV 3 EST PT Modifier: Quantity: 1 CPT4 Code Description: 37290211 11042 - DEB SUBQ TISSUE 20 SQ CM/< ICD-10 Diagnosis Description L97.822 Non-pressure chronic ulcer of other part of left lower leg with L97.521 Non-pressure chronic ulcer of other part of left foot limited [name] Modifier: fat layer expos o breakdown of s Quantity: 1 ed kin Physician Procedures CPT4 Code Description: 1552080 22336 - WC PHYS LEVEL 4 - NEW PT ICD-10 Diagnosis Description I87.2 Venous insufficiency (chronic) (peripheral) P22.449P Laceration without foreign  body, left lower leg, initial encoun L97.822 Non-pressure chronic ulcer of  other part of left lower leg with L97.521 Non-pressure chronic ulcer of other part of left foot limited [name] Modifier: 25 ter fat layer expose o breakdown of sk Quantity: 1 d in CPT4 Code Description: 5300511 11042 - WC PHYS SUBQ TISS 20 SQ CM ICD-10 Diagnosis Description L97.822 Non-pressure chronic ulcer of other part of left lower leg with L97.521 Non-pressure chronic ulcer of other part of left foot limited [name] Modifier: fat layer expose o breakdown of sk Quantity: 1 d in Electronic Signature(s) Signed: 04/19/2018 5:37:52 PM By: Worthy Keeler PA-C Entered By: Worthy Keeler on 04/19/2018 17:37:19

## 2018-04-23 NOTE — Progress Notes (Signed)
[name], [name] ([id number]) Visit Report for 04/19/2018 Abuse/Suicide Risk Screen Details Patient Name: [name], [name]. Date of Service: 04/19/2018 8:00 AM Medical Record Number: [id number] Patient Account Number: [id number] Date of Birth/Sex: Mar 27, 1951 (67 y.o. M) Treating RN: Roger Shelter Primary Care [name]: BESS, KATY Other Clinician: Referring [name]: BESS, KATY Treating [name]/Extender: STONE III, HOYT Weeks in Treatment: 0 Abuse/Suicide Risk Screen Items Answer ABUSE/SUICIDE RISK SCREEN: Has anyone close to you tried to hurt or harm you recentlyo No Do you feel uncomfortable with anyone in your familyo No Has anyone forced you do things that you didnot want to doo No Do you have any thoughts of harming yourselfo No Patient displays signs or symptoms of abuse and/or neglect. No Electronic Signature(s) Signed: 04/19/2018 4:07:12 PM By: Roger Shelter Entered By: Roger Shelter on 04/19/2018 08:32:23 [name], [name] ([id number]) -------------------------------------------------------------------------------- Activities of Daily Living Details Patient Name: Marcus Loll T. Date of Service: 04/19/2018 8:00 AM Medical Record Number: [id number] Patient Account Number: [id number] Date of Birth/Sex: 1951-08-09 (67 y.o. M) Treating RN: Roger Shelter Primary Care [name]: BESS, KATY Other Clinician: Referring [name]: BESS, KATY Treating [name]/Extender: STONE III, HOYT Weeks in Treatment: 0 Activities of Daily Living Items Answer Activities of Daily Living (Please select one for each item) Drive Automobile Completely Able Take Medications Completely Able Use Telephone Completely Able Care for Appearance Completely Able Use Toilet Completely Able Bath / Shower Completely Able Dress Self Completely Able Feed Self Completely Able Walk Completely Able Get In / Out Bed Completely Able Housework Completely Able Prepare Meals Completely [place] for Self Completely Able Electronic Signature(s) Signed: 04/19/2018 4:07:12 PM By: Roger Shelter Entered By: Roger Shelter on 04/19/2018 08:32:55 [name], [name] ([id number]) -------------------------------------------------------------------------------- Education Assessment Details Patient Name: Marcus Loll T. Date of Service: 04/19/2018 8:00 AM Medical Record Number: [id number] Patient Account Number: [id number] Date of Birth/Sex: [birth year] (67 y.o. M) Treating RN: Roger Shelter Primary Care [name]: BESS, KATY Other Clinician: Referring [name]: BESS, KATY Treating [name]/Extender: [name], HOYT Weeks in Treatment: 0 Primary Learner Assessed: Patient Learning Preferences/Education Level/Primary Language Learning Preference: Explanation Highest Education Level: High School Preferred Language: English Cognitive Barrier Assessment/Beliefs Language Barrier: No Translator Needed: No Memory Deficit: No Emotional Barrier: No Cultural/Religious Beliefs Affecting Medical Care: No Physical Barrier Assessment Impaired Vision: Yes Glasses Impaired Hearing: No Decreased Hand dexterity: No Knowledge/Comprehension Assessment Knowledge Level: High Comprehension Level: High Ability to understand written High instructions: Ability to understand verbal High instructions: Motivation Assessment Anxiety Level: Calm Cooperation: Cooperative Education Importance: Acknowledges Need Interest in Health Problems: Asks Questions Perception: Coherent Willingness to Engage in Self- High Management Activities: Readiness to Engage in Self- High Management Activities: Electronic Signature(s) Signed: 04/19/2018 4:07:12 PM By: Roger Shelter Entered By: Roger Shelter on 04/19/2018 08:33:50 [name], [name] ([id number]) -------------------------------------------------------------------------------- Fall Risk Assessment Details Patient Name: [name]. Date of Service: 04/19/2018 8:00 AM Medical Record Number: [id number] Patient Account Number: [id number] Date of Birth/Sex: 1951/09/06 (67 y.o. M) Treating RN: Roger Shelter Primary Care [name]: BESS, KATY Other Clinician: Referring [name]: BESS, KATY Treating [name]/Extender: STONE III, HOYT Weeks in Treatment: 0 Fall Risk Assessment Items Have you had 2 or more falls in the last 12 monthso 0 No Have you had any fall that resulted in injury in the last 12 monthso 0 No FALL RISK ASSESSMENT: History of falling - immediate or within 3 months 0 No Secondary diagnosis 0 No Ambulatory aid None/bed rest/wheelchair/nurse 0 No Crutches/cane/walker 0 No Furniture 0  No IV Access/Saline Lock 0 No Gait/Training Normal/bed rest/immobile 0 No Weak 0 No Impaired 0 No Mental Status Oriented to own ability 0 No Electronic Signature(s) Signed: 04/19/2018 4:07:12 PM By: Roger Shelter Entered By: Roger Shelter on 04/19/2018 08:34:14 [name] ([id number]) -------------------------------------------------------------------------------- Foot Assessment Details Patient Name: Marcus Loll T. Date of Service: 04/19/2018 8:00 AM Medical Record Number: [id number] Patient Account Number: [id number] Date of Birth/Sex: 11-24-1951 (67 y.o. M) Treating RN: Roger Shelter Primary Care [name]: BESS, KATY Other Clinician: Referring [name]: BESS, KATY Treating [name]/Extender: STONE III, HOYT Weeks in Treatment: 0 Foot Assessment Items Site Locations + = Sensation present, - = Sensation absent, C = Callus, U = Ulcer R = Redness, W = Warmth, M = Maceration, PU = Pre-ulcerative lesion F = Fissure, S = Swelling, D = Dryness Assessment Right: Left: Other Deformity: No No Prior Foot Ulcer: No No Prior Amputation: No No Charcot Joint: No No Ambulatory Status: Ambulatory Without Help Gait: Steady Electronic Signature(s) Signed: 04/19/2018 4:07:12 PM By: Roger Shelter Entered By: Roger Shelter on 04/19/2018 08:39:28 [name] ([id number]) -------------------------------------------------------------------------------- Nutrition Risk Assessment Details Patient Name: Marcus Loll T. Date of Service: 04/19/2018 8:00 AM Medical Record Number: [id number] Patient Account Number: [id number] Date of Birth/Sex: [name] 27, 1952 (67 y.o. M) Treating RN: Roger Shelter Primary Care Irem Stoneham: BESS, KATY Other Clinician: Referring Halia Franey: BESS, KATY Treating Simmone Cape/Extender: STONE III, HOYT Weeks in Treatment: 0 Height (in): 69 Weight (lbs): 238 Body Mass Index (BMI): 35.1 Nutrition Risk Assessment Items NUTRITION RISK SCREEN: I have an illness or condition that made me change the kind and/or amount of 0 No food I eat I eat fewer than two meals per day 0 No I eat few fruits and vegetables, or milk products 2 Yes I have three or more drinks of beer, liquor or wine almost every day 0 No I have tooth or mouth problems that make it hard for me to eat 0 No I don't always have enough money to buy the food I need 0 No I eat alone most of the time 0 No I take three or more different prescribed or over-the-counter drugs a day 0 No Without wanting to, I have lost or gained 10 pounds in the last six months 0 No I am not always physically able to shop, cook and/or feed myself 0 No Nutrition Protocols Good Risk Protocol 0 No interventions needed Moderate Risk Protocol Electronic Signature(s) Signed: 04/19/2018 4:07:12 PM By: Roger Shelter Entered By: Roger Shelter on 04/19/2018 08:35:31

## 2018-04-26 ENCOUNTER — Encounter: Payer: PPO | Attending: Physician Assistant | Admitting: Physician Assistant

## 2018-04-26 DIAGNOSIS — Z923 Personal history of irradiation: Secondary | ICD-10-CM | POA: Diagnosis not present

## 2018-04-26 DIAGNOSIS — L97822 Non-pressure chronic ulcer of other part of left lower leg with fat layer exposed: Secondary | ICD-10-CM | POA: Insufficient documentation

## 2018-04-26 DIAGNOSIS — L97522 Non-pressure chronic ulcer of other part of left foot with fat layer exposed: Secondary | ICD-10-CM | POA: Diagnosis not present

## 2018-04-26 DIAGNOSIS — E11622 Type 2 diabetes mellitus with other skin ulcer: Secondary | ICD-10-CM | POA: Diagnosis not present

## 2018-04-26 DIAGNOSIS — Z950 Presence of cardiac pacemaker: Secondary | ICD-10-CM | POA: Diagnosis not present

## 2018-04-26 DIAGNOSIS — E11621 Type 2 diabetes mellitus with foot ulcer: Secondary | ICD-10-CM | POA: Insufficient documentation

## 2018-04-26 DIAGNOSIS — Z8673 Personal history of transient ischemic attack (TIA), and cerebral infarction without residual deficits: Secondary | ICD-10-CM | POA: Insufficient documentation

## 2018-04-26 DIAGNOSIS — I2510 Atherosclerotic heart disease of native coronary artery without angina pectoris: Secondary | ICD-10-CM | POA: Diagnosis not present

## 2018-04-26 DIAGNOSIS — E1140 Type 2 diabetes mellitus with diabetic neuropathy, unspecified: Secondary | ICD-10-CM | POA: Diagnosis not present

## 2018-04-26 DIAGNOSIS — I872 Venous insufficiency (chronic) (peripheral): Secondary | ICD-10-CM | POA: Insufficient documentation

## 2018-04-26 DIAGNOSIS — I10 Essential (primary) hypertension: Secondary | ICD-10-CM | POA: Diagnosis not present

## 2018-04-26 DIAGNOSIS — Z9221 Personal history of antineoplastic chemotherapy: Secondary | ICD-10-CM | POA: Diagnosis not present

## 2018-04-26 DIAGNOSIS — F17210 Nicotine dependence, cigarettes, uncomplicated: Secondary | ICD-10-CM | POA: Diagnosis not present

## 2018-04-26 DIAGNOSIS — I252 Old myocardial infarction: Secondary | ICD-10-CM | POA: Diagnosis not present

## 2018-05-02 NOTE — Progress Notes (Signed)
[name], [name] ([id number]) Visit Report for 04/26/2018 Chief Complaint Document Details Patient Name: [name], [name]. Date of Service: 04/26/2018 8:45 AM Medical Record Number: [id number] Patient Account Number: [id number] Date of Birth/Sex: [name] 11, 1952 (67 y.o. Male) Treating RN: Montey Hora Primary Care Provider: BESS, KATY Other Clinician: Referring Provider: BESS, KATY Treating Provider/Extender: STONE III, Caridad Silveira Weeks in Treatment: 1 Information Obtained from: Patient Chief Complaint Left lower leg and left great toe ulcer Electronic Signature(s) Signed: 04/30/2018 5:56:33 PM By: Worthy Keeler PA-C Entered By: Worthy Keeler on 04/26/2018 09:06:25 [name], [name] ([id number]) -------------------------------------------------------------------------------- Debridement Details Patient Name: [name]. Date of Service: 04/26/2018 8:45 AM Medical Record Number: [id number] Patient Account Number: [id number] Date of Birth/Sex: 03-Jan-1951 (67 y.o. Male) Treating RN: Montey Hora Primary Care Provider: BESS, KATY Other Clinician: Referring Provider: BESS, KATY Treating Provider/Extender: STONE III, Natanael Saladin Weeks in Treatment: 1 Debridement Performed for Wound #2 Left Toe Great Assessment: Performed By: Physician STONE III, Johonna Binette E., PA-C Debridement Type: Debridement Severity of Tissue Pre Fat layer exposed Debridement: Pre-procedure Verification/Time Yes - 09:17 Out Taken: Start Time: 09:17 Pain Control: Lidocaine 4% Topical Solution Total Area Debrided (L x W): 0.4 (cm) x 0.4 (cm) = 0.16 (cm) Tissue and other material Viable, Non-Viable, Callus, Slough, Subcutaneous, Skin: Dermis , Skin: Epidermis, Slough debrided: Level: Skin/Subcutaneous Tissue Debridement Description: Excisional Instrument: Curette Bleeding: Minimum Hemostasis Achieved: Silver Nitrate End Time: 09:20 Procedural Pain: 0 Post Procedural Pain: 0 Response to Treatment: Procedure was tolerated  well Post Debridement Measurements of Total Wound Length: (cm) 0.4 Width: (cm) 0.4 Depth: (cm) 0.4 Volume: (cm) 0.05 Character of Wound/Ulcer Post Debridement: Improved Severity of Tissue Post Debridement: Fat layer exposed Post Procedure Diagnosis Same as Pre-procedure Electronic Signature(s) Signed: 04/26/2018 4:48:08 PM By: Montey Hora Signed: 04/30/2018 5:56:33 PM By: Worthy Keeler PA-C Entered By: Montey Hora on 04/26/2018 09:21:16 [name], [name] ([id number]) -------------------------------------------------------------------------------- Debridement Details Patient Name: Marcus Loll T. Date of Service: 04/26/2018 8:45 AM Medical Record Number: [id number] Patient Account Number: [id number] Date of Birth/Sex: 1951-04-13 (67 y.o. Male) Treating RN: Montey Hora Primary Care Provider: BESS, KATY Other Clinician: Referring Provider: BESS, KATY Treating Provider/Extender: STONE III, Grafton Warzecha Weeks in Treatment: 1 Debridement Performed for Wound #1 Left,Midline,Anterior Lower Leg Assessment: Performed By: Physician STONE III, Azariah Latendresse E., PA-C Debridement Type: Debridement Severity of Tissue Pre Fat layer exposed Debridement: Pre-procedure Verification/Time Yes - 09:23 Out Taken: Start Time: 09:23 Pain Control: Lidocaine 4% Topical Solution Total Area Debrided (L x W): 2.1 (cm) x 1.7 (cm) = 3.57 (cm) Tissue and other material Viable, Non-Viable, Slough, Subcutaneous, Fibrin/Exudate, Slough debrided: Level: Skin/Subcutaneous Tissue Debridement Description: Excisional Instrument: Curette Bleeding: Minimum Hemostasis Achieved: Pressure End Time: 09:25 Procedural Pain: 0 Post Procedural Pain: 0 Response to Treatment: Procedure was tolerated well Post Debridement Measurements of Total Wound Length: (cm) 2.1 Width: (cm) 1.7 Depth: (cm) 0.2 Volume: (cm) 0.561 Character of Wound/Ulcer Post Debridement: Improved Severity of Tissue Post Debridement: Fat layer  exposed Post Procedure Diagnosis Same as Pre-procedure Electronic Signature(s) Signed: 04/26/2018 4:48:08 PM By: Montey Hora Signed: 04/30/2018 5:56:33 PM By: Worthy Keeler PA-C Entered By: Montey Hora on 04/26/2018 09:24:47 [name], [name] ([id number]) -------------------------------------------------------------------------------- HPI Details Patient Name: Marcus Loll T. Date of Service: 04/26/2018 8:45 AM Medical Record Number: [id number] Patient Account Number: [id number] Date of Birth/Sex: [birth year] (67 y.o. Male) Treating RN: Montey Hora Primary Care Provider: BESS, KATY Other Clinician: Referring Provider: BESS, KATY Treating Provider/Extender: STONE III, Mayda Shippee Weeks in Treatment: 1 History of  Present Illness Associated Signs and Symptoms: Patient has a history of chronic venous insufficiency, diabetes mellitus type II, its reform fraction, hypertension HPI Description: 04/19/18 on evaluation today patient presents initially concerning an ulcer of his left great toe and left anterior shin. He states that the shin was definitely a trauma injury where he struck this on a metal pole injuring leg. In regard to the distal left great toe he's really not sure what happened here although he does have a significant callous noted as well. He does have a pacemaker. Fortunately his ABI was 0.98 on the right and 0.83 on the left this appears to be doing fairly well. Overall I'm pleased with that. Nonetheless he states that this really has not seem to be improving. He has been tolerating the dressing changes without complication. The left anterior leg ulcer began on 04/05/18 according to what he tells [place] today although one note I did have for review said it was April 5. I'm inclined to believe it was the fifth she did have an office visit on 04/01/18 with his family nurse practitioner Faith Rogue. Nonetheless either way it's been going on this month for several weeks. The left great toe  has actually been present since January 1 he does not know how this occurred. Fortunately has no pain at the site although he does have pain on the left anterior shin. His hemoglobin A1c is 8.0 in March 2019. 04/26/18 on evaluation today patient presents for follow-up concerning his life into your [place] also as well as his left great toe also. Fortunately the shin actually appears to be doing better on evaluation today in my opinion I'm not seeing as much in the way of fluffernutter on the surface although it does still require debridement today this is improved. Size wise it's really not much different. With that being said the total ulcer actually appears to be a little bit more macerated today I'm not sure why they state that several days ago when this was changed that was not the case. Fortunately is not having severe pain although both areas do hurt the shin is worse. Electronic Signature(s) Signed: 04/30/2018 5:56:33 PM By: Worthy Keeler PA-C Entered By: Worthy Keeler on 04/26/2018 09:28:51 Demery, [name] ([id number]) -------------------------------------------------------------------------------- Physical Exam Details Patient Name: Marcus Loll T. Date of Service: 04/26/2018 8:45 AM Medical Record Number: [id number] Patient Account Number: [id number] Date of Birth/Sex: 1951/02/13 (67 y.o. Male) Treating RN: Montey Hora Primary Care Provider: BESS, KATY Other Clinician: Referring Provider: BESS, KATY Treating Provider/Extender: STONE III, Jaxxson Cavanah Weeks in Treatment: 1 Constitutional Well-nourished and well-hydrated in no acute distress. Respiratory normal breathing without difficulty. Psychiatric this patient is able to make decisions and demonstrates good insight into disease process. Alert and Oriented x 3. pleasant and cooperative. Notes Patient's wound on the left anterior standard requires sharp debridement at this point and post agreement the one that appeared to  be doing much better. I do think the Iodoflex is doing his job as far as helping to reduce the biofilm and slough buildup. We will continue with this. In regard to the town upon further inspection he actually had several black speckles in the periphery of the inner portion of the wound. This actually makes me more concerned about the possibility of this being a wart that calls the issue leading to the altar that he has currently. Obviously this is complicated by the fact that he is a diabetic. With that being said I did utilize  after debridement some silver nitrate to try to help potentially kill any virus that may be remaining in the surrounding subcu tissue he did have some discomfort with this. Electronic Signature(s) Signed: 04/30/2018 5:56:33 PM By: Worthy Keeler PA-C Entered By: Worthy Keeler on 04/26/2018 09:29:59 Crisanti, [name] ([id number]) -------------------------------------------------------------------------------- Physician Orders Details Patient Name: [name]. Date of Service: 04/26/2018 8:45 AM Medical Record Number: [id number] Patient Account Number: [id number] Date of Birth/Sex: Oct 04, 1951 (67 y.o. Male) Treating RN: Montey Hora Primary Care Provider: BESS, KATY Other Clinician: Referring Provider: BESS, KATY Treating Provider/Extender: Melburn Hake, Raysa Bosak Weeks in Treatment: 1 Verbal / Phone Orders: No Diagnosis Coding ICD-10 Coding Code Description I87.2 Venous insufficiency (chronic) (peripheral) S81.812A Laceration without foreign body, left lower leg, initial encounter L97.822 Non-pressure chronic ulcer of other part of left lower leg with fat layer exposed L97.521 Non-pressure chronic ulcer of other part of left foot limited to breakdown of skin E11.621 Type 2 diabetes mellitus with foot ulcer I63.9 Cerebral infarction, unspecified I10 Essential (primary) hypertension Wound Cleansing Wound #1 Left,Midline,Anterior Lower Leg o Cleanse wound with mild  soap and water o May shower with protection. Wound #2 Left Toe Great o Cleanse wound with mild soap and water o May Shower, gently pat wound dry prior to applying new dressing. Anesthetic (add to Medication List) Wound #1 Left,Midline,Anterior Lower Leg o Topical Lidocaine 4% cream applied to wound bed prior to debridement (In Clinic Only). Wound #2 Left Toe Great o Topical Lidocaine 4% cream applied to wound bed prior to debridement (In Clinic Only). Primary Wound Dressing Wound #1 Left,Midline,Anterior Lower Leg o Iodoflex Wound #2 Left Toe Great o Silver Alginate Secondary Dressing Wound #1 Left,Midline,Anterior Lower Leg o ABD pad Wound #2 Left Toe Great o Gauze and Kerlix/Conform - secure with coban Dressing Change Frequency Goehring, [name] ([id number]) Wound #1 Left,Midline,Anterior Lower Leg o Change dressing every week Wound #2 Left Toe Great o Change dressing every other day. Follow-up Appointments Wound #1 Left,Midline,Anterior Lower Leg o Return Appointment in 1 week. Wound #2 Left Toe Great o Return Appointment in 1 week. Edema Control Wound #1 Left,Midline,Anterior Lower Leg o 3 Layer Compression System - Left Lower Extremity o Patient to wear own compression stockings - right lower leg o Elevate legs to the level of the heart and pump ankles as often as possible Additional Orders / Instructions Wound #1 Left,Midline,Anterior Lower Leg o Stop Smoking o Vitamin A; Vitamin C, Zinc - Please add a multivitamin with 100% of these o Increase protein intake. o Activity as tolerated Wound #2 Left Toe Great o Stop Smoking o Vitamin A; Vitamin C, Zinc - Please add a multivitamin with 100% of these o Increase protein intake. o Activity as tolerated Electronic Signature(s) Signed: 04/26/2018 4:48:08 PM By: Montey Hora Signed: 04/30/2018 5:56:33 PM By: Worthy Keeler PA-C Entered By: Montey Hora on 04/26/2018  09:25:43 Hudgins, [name] ([id number]) -------------------------------------------------------------------------------- Problem List Details Patient Name: Marcus Loll T. Date of Service: 04/26/2018 8:45 AM Medical Record Number: [id number] Patient Account Number: [id number] Date of Birth/Sex: May 07, 1951 (67 y.o. Male) Treating RN: Montey Hora Primary Care Provider: BESS, KATY Other Clinician: Referring Provider: BESS, KATY Treating Provider/Extender: Melburn Hake, Sherah Lund Weeks in Treatment: 1 Active Problems ICD-10 Impacting Encounter Code Description Active Date Wound Healing Diagnosis I87.2 Venous insufficiency (chronic) (peripheral) 04/19/2018 Yes O53.664Q Laceration without foreign body, left lower leg, initial 04/19/2018 Yes encounter L97.822 Non-pressure chronic ulcer of other part of left lower leg with 04/19/2018 Yes  fat layer exposed L97.521 Non-pressure chronic ulcer of other part of left foot limited to 04/19/2018 Yes breakdown of skin E11.621 Type 2 diabetes mellitus with foot ulcer 04/19/2018 Yes I63.9 Cerebral infarction, unspecified 04/19/2018 Yes I10 Essential (primary) hypertension 04/19/2018 Yes Inactive Problems Resolved Problems Electronic Signature(s) Signed: 04/30/2018 5:56:33 PM By: Worthy Keeler PA-C Entered By: Worthy Keeler on 04/26/2018 09:06:07 Aldridge, [name] ([id number]) -------------------------------------------------------------------------------- Progress Note Details Patient Name: Marcus Loll T. Date of Service: 04/26/2018 8:45 AM Medical Record Number: [id number] Patient Account Number: [id number] Date of Birth/Sex: 23-Feb-1951 (67 y.o. Male) Treating RN: Montey Hora Primary Care Provider: BESS, KATY Other Clinician: Referring Provider: BESS, KATY Treating Provider/Extender: Melburn Hake, Chayse Gracey Weeks in Treatment: 1 Subjective Chief Complaint Information obtained from Patient Left lower leg and left great toe ulcer History of Present Illness  (HPI) The following HPI elements were documented for the patient's wound: Associated Signs and Symptoms: Patient has a history of chronic venous insufficiency, diabetes mellitus type II, its reform fraction, hypertension 04/19/18 on evaluation today patient presents initially concerning an ulcer of his left great toe and left anterior shin. He states that the shin was definitely a trauma injury where he struck this on a metal pole injuring leg. In regard to the distal left great toe he's really not sure what happened here although he does have a significant callous noted as well. He does have a pacemaker. Fortunately his ABI was 0.98 on the right and 0.83 on the left this appears to be doing fairly well. Overall I'm pleased with that. Nonetheless he states that this really has not seem to be improving. He has been tolerating the dressing changes without complication. The left anterior leg ulcer began on 04/05/18 according to what he tells [place] today although one note I did have for review said it was April 5. I'm inclined to believe it was the fifth she did have an office visit on 04/01/18 with his family nurse practitioner Faith Rogue. Nonetheless either way it's been going on this month for several weeks. The left great toe has actually been present since January 1 he does not know how this occurred. Fortunately has no pain at the site although he does have pain on the left anterior shin. His hemoglobin A1c is 8.0 in March 2019. 04/26/18 on evaluation today patient presents for follow-up concerning his life into your [place] also as well as his left great toe also. Fortunately the shin actually appears to be doing better on evaluation today in my opinion I'm not seeing as much in the way of fluffernutter on the surface although it does still require debridement today this is improved. Size wise it's really not much different. With that being said the total ulcer actually appears to be a little bit  more macerated today I'm not sure why they state that several days ago when this was changed that was not the case. Fortunately is not having severe pain although both areas do hurt the shin is worse. Patient History Information obtained from Patient. Family History Cancer - Father, Diabetes - Father, Heart Disease - Mother,Father, Hypertension - Mother, Kidney Disease - Child, No family history of Lung Disease, Seizures, Stroke, Thyroid Problems, Tuberculosis. Social History Current every day smoker - 1 - 1/2 pack daily, Marital Status - Married, Alcohol Use - Rarely, Drug Use - No History, Caffeine Use - Daily - coffee. Review of Systems (ROS) Constitutional Symptoms (General Health) Denies complaints or symptoms of Fever, Chills. Respiratory The  patient has no complaints or symptoms. Cardiovascular The patient has no complaints or symptoms. [name], [name] ([id number]) Psychiatric The patient has no complaints or symptoms. Objective Constitutional Well-nourished and well-hydrated in no acute distress. Vitals Time Taken: 8:49 AM, Height: 69 in, Weight: 238 lbs, BMI: 35.1, Temperature: 97.7 F, Pulse: 75 bpm, Respiratory Rate: 16 breaths/min, Blood Pressure: 125/67 mmHg. Respiratory normal breathing without difficulty. Psychiatric this patient is able to make decisions and demonstrates good insight into disease process. Alert and Oriented x 3. pleasant and cooperative. General Notes: Patient's wound on the left anterior standard requires sharp debridement at this point and post agreement the one that appeared to be doing much better. I do think the Iodoflex is doing his job as far as helping to reduce the biofilm and slough buildup. We will continue with this. In regard to the town upon further inspection he actually had several black speckles in the periphery of the inner portion of the wound. This actually makes me more concerned about the possibility of this being a wart that  calls the issue leading to the altar that he has currently. Obviously this is complicated by the fact that he is a diabetic. With that being said I did utilize after debridement some silver nitrate to try to help potentially kill any virus that may be remaining in the surrounding subcu tissue he did have some discomfort with this. Integumentary (Hair, Skin) Wound #1 status is Open. Original cause of wound was Trauma. The wound is located on the Left,Midline,Anterior Lower Leg. The wound measures 2.1cm length x 1.7cm width x 0.1cm depth; 2.804cm^2 area and 0.28cm^3 volume. There is Fat Layer (Subcutaneous Tissue) Exposed exposed. There is no tunneling or undermining noted. There is a small amount of serous drainage noted. The wound margin is flat and intact. There is medium (34-66%) pink granulation within the wound bed. There is a medium (34-66%) amount of necrotic tissue within the wound bed including Eschar and Adherent Slough. The periwound skin appearance did not exhibit: Callus, Crepitus, Excoriation, Induration, Rash, Scarring, Dry/Scaly, Maceration, Atrophie Blanche, Cyanosis, Ecchymosis, Hemosiderin Staining, Mottled, Pallor, Rubor, Erythema. Periwound temperature was noted as No Abnormality. The periwound has tenderness on palpation. Wound #2 status is Open. Original cause of wound was Trauma. The wound is located on the Left Toe Great. The wound measures 0.4cm length x 0.4cm width x 0.3cm depth; 0.126cm^2 area and 0.038cm^3 volume. There is Fat Layer (Subcutaneous Tissue) Exposed exposed. There is no tunneling or undermining noted. There is a large amount of serous drainage noted. The wound margin is flat and intact. There is small (1-33%) granulation within the wound bed. There is a large (67-100%) amount of necrotic tissue within the wound bed including Eschar and Adherent Slough. The periwound skin appearance exhibited: Callus, Maceration. The periwound skin appearance did not exhibit:  Crepitus, Excoriation, Induration, Rash, Scarring, Dry/Scaly, Atrophie Blanche, Cyanosis, Ecchymosis, Hemosiderin Staining, Mottled, Pallor, Rubor, Erythema. Periwound temperature was noted as No Abnormality. [name], [name] ([id number]) Assessment Active Problems ICD-10 I87.2 - Venous insufficiency (chronic) (peripheral) Y86.578I - Laceration without foreign body, left lower leg, initial encounter [phone] - Non-pressure chronic ulcer of other part of left lower leg with fat layer exposed L97.521 - Non-pressure chronic ulcer of other part of left foot limited to breakdown of skin E11.621 - Type 2 diabetes mellitus with foot ulcer I63.9 - Cerebral infarction, unspecified I10 - Essential (primary) hypertension Procedures Wound #1 Pre-procedure diagnosis of Wound #1 is a Diabetic Wound/Ulcer of the Lower  Extremity located on the Left,Midline,Anterior Lower Leg .Severity of Tissue Pre Debridement is: Fat layer exposed. There was a Excisional Skin/Subcutaneous Tissue Debridement with a total area of 3.57 sq cm performed by STONE III, Lyniah Fujita E., PA-C. With the following instrument(s): Curette. to remove Viable and Non-Viable tissue/material Material removed includes Subcutaneous Tissue, and Slough, Fibrin/Exudate, and Brenda after achieving pain control using Lidocaine 4% Topical Solution. No specimens were taken. A time out was conducted at 09:23, prior to the start of the procedure. A Minimum amount of bleeding was controlled with Pressure. The procedure was tolerated well with a pain level of 0 throughout and a pain level of 0 following the procedure. Post Debridement Measurements: 2.1cm length x 1.7cm width x 0.2cm depth; 0.561cm^3 volume. Character of Wound/Ulcer Post Debridement is improved. Severity of Tissue Post Debridement is: Fat layer exposed. Post procedure Diagnosis Wound #1: Same as Pre-Procedure Wound #2 Pre-procedure diagnosis of Wound #2 is a Diabetic Wound/Ulcer of the Lower  Extremity located on the Left Toe Great .Severity of Tissue Pre Debridement is: Fat layer exposed. There was a Excisional Skin/Subcutaneous Tissue Debridement with a total area of 0.16 sq cm performed by STONE III, Kinta Martis E., PA-C. With the following instrument(s): Curette. to remove Viable and Non-Viable tissue/material Material removed includes Callus, Subcutaneous Tissue, and Slough, Skin: Dermis, Skin: Epidermis, and Slough after achieving pain control using Lidocaine 4% Topical Solution. No specimens were taken. A time out was conducted at 09:17, prior to the start of the procedure. A Minimum amount of bleeding was controlled with Silver Nitrate. The procedure was tolerated well with a pain level of 0 throughout and a pain level of 0 following the procedure. Post Debridement Measurements: 0.4cm length x 0.4cm width x 0.4cm depth; 0.05cm^3 volume. Character of Wound/Ulcer Post Debridement is improved. Severity of Tissue Post Debridement is: Fat layer exposed. Post procedure Diagnosis Wound #2: Same as Pre-Procedure Plan Wound Cleansing: Wound #1 Left,Midline,Anterior Lower Leg: Cleanse wound with mild soap and water May shower with protection. Wound #2 Left Toe Great: Cleanse wound with mild soap and water [name], [name] ([id number]) May Shower, gently pat wound dry prior to applying new dressing. Anesthetic (add to Medication List): Wound #1 Left,Midline,Anterior Lower Leg: Topical Lidocaine 4% cream applied to wound bed prior to debridement (In Clinic Only). Wound #2 Left Toe Great: Topical Lidocaine 4% cream applied to wound bed prior to debridement (In Clinic Only). Primary Wound Dressing: Wound #1 Left,Midline,Anterior Lower Leg: Iodoflex Wound #2 Left Toe Great: Silver Alginate Secondary Dressing: Wound #1 Left,Midline,Anterior Lower Leg: ABD pad Wound #2 Left Toe Great: Gauze and Kerlix/Conform - secure with coban Dressing Change Frequency: Wound #1 Left,Midline,Anterior  Lower Leg: Change dressing every week Wound #2 Left Toe Great: Change dressing every other day. Follow-up Appointments: Wound #1 Left,Midline,Anterior Lower Leg: Return Appointment in 1 week. Wound #2 Left Toe Great: Return Appointment in 1 week. Edema Control: Wound #1 Left,Midline,Anterior Lower Leg: 3 Layer Compression System - Left Lower Extremity Patient to wear own compression stockings - right lower leg Elevate legs to the level of the heart and pump ankles as often as possible Additional Orders / Instructions: Wound #1 Left,Midline,Anterior Lower Leg: Stop Smoking Vitamin A; Vitamin C, Zinc - Please add a multivitamin with 100% of these Increase protein intake. Activity as tolerated Wound #2 Left Toe Great: Stop Smoking Vitamin A; Vitamin C, Zinc - Please add a multivitamin with 100% of these Increase protein intake. Activity as tolerated Currently I'm in a recommend  that we continue with the Current wound care measures for the left shin and for the toe we are going to initiate treatment with a surrounding addressing to try to help with any fluid. Patient is in agreement with the plan. We will see within staying in one weeks time. Please see above for specific wound care orders. We will see patient for re-evaluation in 1 week(s) here in the clinic. If anything worsens or changes patient will contact our office for additional recommendations. Electronic Signature(s) Signed: 04/30/2018 5:56:33 PM By: Worthy Keeler PA-C Entered By: Worthy Keeler on 04/26/2018 09:30:42 [name], [name] ([id number]) [name], [name] ([id number]) -------------------------------------------------------------------------------- ROS/PFSH Details Patient Name: [name]. Date of Service: 04/26/2018 8:45 AM Medical Record Number: [id number] Patient Account Number: [id number] Date of Birth/Sex: 1951-09-04 (67 y.o. Male) Treating RN: Montey Hora Primary Care Provider: BESS, KATY Other  Clinician: Referring Provider: BESS, KATY Treating Provider/Extender: STONE III, Alayah Knouff Weeks in Treatment: 1 Information Obtained From Patient Wound History Do you currently have one or more open woundso Yes How many open wounds do you currently haveo 2 Approximately how long have you had your woundso 2 weeks How have you been treating your wound(s) until nowo yes Doctor Has your wound(s) ever healed and then re-openedo No Have you had any lab work done in the past montho No Have you tested positive for an antibiotic resistant organism (MRSA, VRE)o No Have you tested positive for osteomyelitis (bone infection)o No Have you had any tests for circulation on your legso No Have you had other problems associated with your woundso Infection, Swelling Constitutional Symptoms (General Health) Complaints and Symptoms: Negative for: Fever; Chills Eyes Medical History: Negative for: Cataracts; Glaucoma; Optic Neuritis Ear/Nose/Mouth/Throat Medical History: Negative for: Chronic sinus problems/congestion Hematologic/Lymphatic Medical History: Positive for: Lymphedema Negative for: Anemia; Hemophilia; Human Immunodeficiency Virus; Sickle Cell Disease Respiratory Complaints and Symptoms: No Complaints or Symptoms Medical History: Negative for: Aspiration; Asthma; Chronic Obstructive Pulmonary Disease (COPD); Pneumothorax; Sleep Apnea; Tuberculosis Cardiovascular Complaints and Symptoms: No Complaints or Symptoms Medical History: KODIAK, ROLLYSON ([id number]) Positive for: Arrhythmia - pacemaker; Coronary Artery Disease; Hypertension; Myocardial Infarction Negative for: Deep Vein Thrombosis; Hypotension; Peripheral Arterial Disease; Peripheral Venous Disease; Phlebitis; Vasculitis Gastrointestinal Medical History: Negative for: Cirrhosis ; Colitis; Crohnos; Hepatitis A; Hepatitis B; Hepatitis C Endocrine Medical History: Positive for: Type II Diabetes Negative for: Type I  Diabetes Time with diabetes: 5 years Treated with: Oral agents Blood sugar tested every day: Yes Tested : daily AM Blood sugar testing results: Breakfast: 174 Genitourinary Medical History: Negative for: End Stage Renal Disease Immunological Medical History: Negative for: Lupus Erythematosus; Raynaudos; Scleroderma Integumentary (Skin) Medical History: Negative for: History of Burn; History of pressure wounds Musculoskeletal Medical History: Negative for: Gout; Rheumatoid Arthritis; Osteoarthritis; Osteomyelitis Neurologic Medical History: Positive for: Neuropathy Negative for: Dementia; Quadriplegia; Paraplegia; Seizure Disorder Oncologic Medical History: Positive for: Received Chemotherapy; Received Radiation - Seeds prostate Psychiatric Complaints and Symptoms: No Complaints or Symptoms Medical History: Negative for: Anorexia/bulimia; Confinement Anxiety [name], [name] ([id number]) Immunizations Pneumococcal Vaccine: Received Pneumococcal Vaccination: Yes Tetanus Vaccine: Last tetanus shot: 12/25/2017 Implantable Devices Family and Social History Cancer: Yes - Father; Diabetes: Yes - Father; Heart Disease: Yes - Mother,Father; Hypertension: Yes - Mother; Kidney Disease: Yes - Child; Lung Disease: No; Seizures: No; Stroke: No; Thyroid Problems: No; Tuberculosis: No; Current every day smoker - 1 - 1/2 pack daily; Marital Status - Married; Alcohol Use: Rarely; Drug Use: No History; Caffeine Use: Daily - coffee; Financial  Concerns: No; Food, Clothing or Shelter Needs: No; Support System Lacking: No; Transportation Concerns: No Physician Affirmation I have reviewed and agree with the above information. Electronic Signature(s) Signed: 04/26/2018 4:48:08 PM By: Montey Hora Signed: 04/30/2018 5:56:33 PM By: Worthy Keeler PA-C Entered By: Worthy Keeler on 04/26/2018 09:29:12 [name], [name]  ([id number]) -------------------------------------------------------------------------------- SuperBill Details Patient Name: [name]. Date of Service: 04/26/2018 Medical Record Number: [id number] Patient Account Number: [id number] Date of Birth/Sex: 1951/03/28 (67 y.o. Male) Treating RN: Montey Hora Primary Care Provider: BESS, KATY Other Clinician: Referring Provider: BESS, KATY Treating Provider/Extender: Melburn Hake, Judea Riches Weeks in Treatment: 1 Diagnosis Coding ICD-10 Codes Code Description I87.2 Venous insufficiency (chronic) (peripheral) S81.812A Laceration without foreign body, left lower leg, initial encounter L97.822 Non-pressure chronic ulcer of other part of left lower leg with fat layer exposed L97.521 Non-pressure chronic ulcer of other part of left foot limited to breakdown of skin E11.621 Type 2 diabetes mellitus with foot ulcer I63.9 Cerebral infarction, unspecified I10 Essential (primary) hypertension Facility Procedures CPT4 Code Description: 80321224 11042 - DEB SUBQ TISSUE 20 SQ CM/< ICD-10 Diagnosis Description L97.822 Non-pressure chronic ulcer of other part of left lower leg with L97.521 Non-pressure chronic ulcer of other part of left foot limited t Modifier: fat layer expos o breakdown of s Quantity: 1 ed kin Physician Procedures CPT4 Code Description: 8250037 11042 - WC PHYS SUBQ TISS 20 SQ CM ICD-10 Diagnosis Description L97.822 Non-pressure chronic ulcer of other part of left lower leg with L97.521 Non-pressure chronic ulcer of other part of left foot limited t Modifier: fat layer expose o breakdown of sk Quantity: 1 d in Electronic Signature(s) Signed: 04/30/2018 5:56:33 PM By: Worthy Keeler PA-C Entered By: Worthy Keeler on 04/26/2018 09:31:17

## 2018-05-02 NOTE — Progress Notes (Signed)
[name], [name] ([id number]) Visit Report for 04/26/2018 Arrival Information Details Patient Name: [name], [name]. Date of Service: 04/26/2018 8:45 AM Medical Record Number: [id number] Patient Account Number: [id number] Date of Birth/Sex: Apr 28, 1951 (67 y.o. Male) Treating RN: Cornell Barman Primary Care Adnan Vanvoorhis: BESS, KATY Other Clinician: Referring Debora Stockdale: BESS, KATY Treating Doctor Sheahan/Extender: Melburn Hake, HOYT Weeks in Treatment: 1 Visit Information History Since Last Visit Added or deleted any medications: No Patient Arrived: Ambulatory Any new allergies or adverse reactions: No Arrival Time: 08:47 Had a fall or experienced change in No Accompanied By: wife activities of daily living that may affect Transfer Assistance: None risk of falls: Patient Identification Verified: Yes Signs or symptoms of abuse/neglect since last visito No Secondary Verification Process Completed: Yes Hospitalized since last visit: No Patient Has Alerts: Yes Implantable device outside of the clinic excluding No Patient Alerts: Type II Diabetic cellular tissue based products placed in the center since last visit: Has Dressing in Place as Prescribed: Yes Pain Present Now: No Electronic Signature(s) Signed: 04/26/2018 5:40:02 PM By: Gretta Cool, BSN, RN, CWS, Kim RN, BSN Entered By: Gretta Cool, BSN, RN, CWS, Kim on 04/26/2018 08:48:46 [name], [name] ([id number]) -------------------------------------------------------------------------------- Encounter Discharge Information Details Patient Name: Marcus Loll T. Date of Service: 04/26/2018 8:45 AM Medical Record Number: [id number] Patient Account Number: [id number] Date of Birth/Sex: 1951/02/20 (67 y.o. Male) Treating RN: Montey Hora Primary Care Yaire Kreher: BESS, KATY Other Clinician: Referring Amram Maya: BESS, KATY Treating Maxyne Derocher/Extender: Melburn Hake, HOYT Weeks in Treatment: 1 Encounter Discharge Information Items Discharge Pain Level: 0 Discharge  Condition: Stable Ambulatory Status: Ambulatory Discharge Destination: Home Transportation: Private Auto Accompanied By: wife Schedule Follow-up Appointment: Yes Medication Reconciliation completed and No provided to Patient/Care Cephas Revard: Provided on Clinical Summary of Care: 04/26/2018 Form Type Recipient Paper Patient WM Electronic Signature(s) Signed: 04/26/2018 4:40:49 PM By: Alric Quan Entered By: Alric Quan on 04/26/2018 09:39:28 [name], [name] ([id number]) -------------------------------------------------------------------------------- Lower Extremity Assessment Details Patient Name: Marcus Loll T. Date of Service: 04/26/2018 8:45 AM Medical Record Number: [id number] Patient Account Number: [id number] Date of Birth/Sex: [birth year] (67 y.o. Male) Treating RN: Cornell Barman Primary Care Neeka Urista: BESS, KATY Other Clinician: Referring Janalee Grobe: BESS, KATY Treating Jannah Guardiola/Extender: STONE III, HOYT Weeks in Treatment: 1 Edema Assessment Assessed: [Left: No] [Right: No] [Left: Edema] [Right: :] Calf Left: Right: Point of Measurement: 34 cm From Medial Instep 39.5 cm cm Ankle Left: Right: Point of Measurement: 11 cm From Medial Instep 25.5 cm cm Vascular Assessment Claudication: Claudication Assessment [Left:None] Pulses: Dorsalis Pedis Palpable: [Left:Yes] Posterior Tibial Palpable: [Left:Yes] Extremity colors, hair growth, and conditions: Extremity Color: [Left:Hyperpigmented] Hair Growth on Extremity: [Left:Yes] Temperature of Extremity: [Left:Warm] Capillary Refill: [Left:< 3 seconds] Toe Nail Assessment Left: Right: Discolored: No Deformed: Yes Improper Length and Hygiene: No Electronic Signature(s) Signed: 04/26/2018 5:40:02 PM By: Gretta Cool, BSN, RN, CWS, Kim RN, BSN Entered By: Gretta Cool, BSN, RN, CWS, Kim on 04/26/2018 09:02:24 Marcus Lark ([id number]) -------------------------------------------------------------------------------- Multi Wound  Chart Details Patient Name: Marcus Lark. Date of Service: 04/26/2018 8:45 AM Medical Record Number: [id number] Patient Account Number: [id number] Date of Birth/Sex: January 29, 1951 (67 y.o. Male) Treating RN: Montey Hora Primary Care Rashelle Ireland: BESS, KATY Other Clinician: Referring Yaser Harvill: BESS, KATY Treating Leilanny Fluitt/Extender: STONE III, HOYT Weeks in Treatment: 1 Vital Signs Height(in): 69 Pulse(bpm): 75 Weight(lbs): 238 Blood Pressure(mmHg): 125/67 Body Mass Index(BMI): 35 Temperature(F): 97.7 Respiratory Rate 16 (breaths/min): Photos: [1:No Photos] [2:No Photos] [N/A:N/A] Wound Location: [1:Left Lower Leg - Midline, Anterior] [2:Left Toe Great] [N/A:N/A] Wounding Event: [1:Trauma] [2:Trauma] [N/A:N/A] Primary  Etiology: [1:Diabetic Wound/Ulcer of the Lower Extremity] [2:Diabetic Wound/Ulcer of the Lower Extremity] [N/A:N/A] Secondary Etiology: [1:Trauma, Other] [2:Trauma, Other] [N/A:N/A] Comorbid History: [1:Lymphedema, Arrhythmia, Coronary Artery Disease, Hypertension, Myocardial Infarction, Type II Diabetes, Neuropathy, Received Chemotherapy, Received Radiation] [2:Lymphedema, Arrhythmia, Coronary Artery Disease, Hypertension,  Myocardial Infarction, Type II Diabetes, Neuropathy, Received Chemotherapy, Received Radiation] [N/A:N/A] Date Acquired: [1:04/05/2018] [2:12/25/2017] [N/A:N/A] Weeks of Treatment: [1:1] [2:1] [N/A:N/A] Wound Status: [1:Open] [2:Open] [N/A:N/A] Pending Amputation on [1:No] [2:Yes] [N/A:N/A] Presentation: Measurements L x W x D [1:2.1x1.7x0.1] [2:0.4x0.4x0.3] [N/A:N/A] (cm) Area (cm) : [1:2.804] [2:0.126] [N/A:N/A] Volume (cm) : [1:0.28] [2:0.038] [N/A:N/A] % Reduction in Area: [1:6.10%] [2:-168.10%] [N/A:N/A] % Reduction in Volume: [1:6.00%] [2:-660.00%] [N/A:N/A] Classification: [1:Grade 1] [2:Grade 1] [N/A:N/A] Exudate Amount: [1:Small] [2:Large] [N/A:N/A] Exudate Type: [1:Serous] [2:Serous] [N/A:N/A] Exudate Color: [1:amber] [2:amber]  [N/A:N/A] Wound Margin: [1:Flat and Intact] [2:Flat and Intact] [N/A:N/A] Granulation Amount: [1:Medium (34-66%)] [2:Small (1-33%)] [N/A:N/A] Granulation Quality: [1:Pink] [2:N/A] [N/A:N/A] Necrotic Amount: [1:Medium (34-66%)] [2:Large (67-100%)] [N/A:N/A] Necrotic Tissue: [1:Eschar, Adherent Slough] [2:Eschar, Adherent Slough] [N/A:N/A] Exposed Structures: [1:Fat Layer (Subcutaneous Tissue) Exposed: Yes Fascia: No] [2:Fat Layer (Subcutaneous Tissue) Exposed: Yes Fascia: No] [N/A:N/A] Tendon: No Tendon: No Muscle: No Muscle: No Joint: No Joint: No Bone: No Bone: No Epithelialization: None Medium (34-66%) N/A Periwound Skin Texture: Excoriation: No Callus: Yes N/A Induration: No Excoriation: No Callus: No Induration: No Crepitus: No Crepitus: No Rash: No Rash: No Scarring: No Scarring: No Periwound Skin Moisture: Maceration: No Maceration: Yes N/A Dry/Scaly: No Dry/Scaly: No Periwound Skin Color: Atrophie Blanche: No Atrophie Blanche: No N/A Cyanosis: No Cyanosis: No Ecchymosis: No Ecchymosis: No Erythema: No Erythema: No Hemosiderin Staining: No Hemosiderin Staining: No Mottled: No Mottled: No Pallor: No Pallor: No Rubor: No Rubor: No Temperature: No Abnormality No Abnormality N/A Tenderness on Palpation: Yes No N/A Wound Preparation: Ulcer Cleansing: Ulcer Cleansing: N/A Rinsed/Irrigated with Saline Rinsed/Irrigated with Saline Topical Anesthetic Applied: Topical Anesthetic Applied: Other: lidocaine 4% Other: lidocaine 4% Treatment Notes Electronic Signature(s) Signed: 04/26/2018 4:48:08 PM By: Montey Hora Entered By: Montey Hora on 04/26/2018 09:18:04 [name], [name] ([id number]) -------------------------------------------------------------------------------- Multi-Disciplinary Care Plan Details Patient Name: Marcus Lark. Date of Service: 04/26/2018 8:45 AM Medical Record Number: [id number] Patient Account Number: [id number] Date of  Birth/Sex: 1951/08/09 (67 y.o. Male) Treating RN: Montey Hora Primary Care Yassmine Tamm: BESS, KATY Other Clinician: Referring Monseratt Ledin: BESS, KATY Treating Ahren Pettinger/Extender: STONE III, HOYT Weeks in Treatment: 1 Active Inactive ` Abuse / Safety / Falls / Self Care Management Nursing Diagnoses: Potential for falls Goals: Patient will remain injury free related to falls Date Initiated: 04/19/2018 Target Resolution Date: 05/04/2018 Goal Status: Active Interventions: Assess fall risk on admission and as needed Notes: ` Nutrition Nursing Diagnoses: Impaired glucose control: actual or potential Goals: Patient/caregiver agrees to and verbalizes understanding of need to use nutritional supplements and/or vitamins as prescribed Date Initiated: 04/19/2018 Target Resolution Date: 06/07/2018 Goal Status: Active Patient/caregiver verbalizes understanding of need to maintain therapeutic glucose control per primary care physician Date Initiated: 04/19/2018 Target Resolution Date: 05/31/2018 Goal Status: Active Interventions: Assess HgA1c results as ordered upon admission and as needed Assess patient nutrition upon admission and as needed per policy Notes: ` Orientation to the Wound Care Program Nursing Diagnoses: Knowledge deficit related to the wound healing center program Goals: Patient/caregiver will verbalize understanding of the [place] JORGELUIS, GURGANUS ([id number]) Date Initiated: 04/19/2018 Target Resolution Date: 06/29/2018 Goal Status: Active Interventions: Provide education on orientation to the wound center Notes: ` Wound/Skin Impairment Nursing Diagnoses: Impaired tissue integrity  Goals: Ulcer/skin breakdown will heal within 14 weeks Date Initiated: 04/19/2018 Target Resolution Date: 06/29/2018 Goal Status: Active Interventions: Assess patient/caregiver ability to obtain necessary supplies Assess patient/caregiver ability to perform ulcer/skin care  regimen upon admission and as needed Assess ulceration(s) every visit Notes: Electronic Signature(s) Signed: 04/26/2018 4:48:08 PM By: Montey Hora Entered By: Montey Hora on 04/26/2018 09:17:40 [name], [name] ([id number]) -------------------------------------------------------------------------------- Pain Assessment Details Patient Name: Marcus Loll T. Date of Service: 04/26/2018 8:45 AM Medical Record Number: [id number] Patient Account Number: [id number] Date of Birth/Sex: 1951/12/13 (67 y.o. Male) Treating RN: Cornell Barman Primary Care Jamiel Goncalves: BESS, KATY Other Clinician: Referring Zita Ozimek: BESS, KATY Treating Ioan Landini/Extender: STONE III, HOYT Weeks in Treatment: 1 Active Problems Location of Pain Severity and Description of Pain Patient Has Paino No Site Locations Rate the pain. Current Pain Level: 5 Character of Pain Describe the Pain: Aching, Burning, Shooting Pain Management and Medication Current Pain Management: Goals for Pain Management Topical or injectable lidocaine is offered to patient for acute pain when surgical debridement is performed. If needed, Patient is instructed to use over the counter pain medication for the following 24-48 hours after debridement. Wound care MDs do not prescribed pain medications. Patient has chronic pain or uncontrolled pain. Patient has been instructed to make an appointment with their Primary Care Physician for pain management. Electronic Signature(s) Signed: 04/26/2018 5:40:02 PM By: Gretta Cool, BSN, RN, CWS, Kim RN, BSN Entered By: Gretta Cool, BSN, RN, CWS, Kim on 04/26/2018 08:49:23 [name], [name] ([id number]) -------------------------------------------------------------------------------- Patient/Caregiver Education Details Patient Name: [name], [name]. Date of Service: 04/26/2018 8:45 AM Medical Record Number: [id number] Patient Account Number: [id number] Date of Birth/Gender: 02-17-1951 (67 y.o. Male) Treating RN: Ahmed Prima Primary Care Physician: BESS, KATY Other Clinician: Referring Physician: BESS, KATY Treating Physician/Extender: Melburn Hake, HOYT Weeks in Treatment: 1 Education Assessment Education Provided To: Patient Education Topics Provided Wound/Skin Impairment: Handouts: Caring for Your Ulcer, Other: change dressing as ordered Methods: Demonstration, Explain/Verbal Responses: State content correctly Electronic Signature(s) Signed: 04/26/2018 4:40:49 PM By: Alric Quan Entered By: Alric Quan on 04/26/2018 09:39:43 [name], [name] ([id number]) -------------------------------------------------------------------------------- Wound Assessment Details Patient Name: Marcus Loll T. Date of Service: 04/26/2018 8:45 AM Medical Record Number: [id number] Patient Account Number: [id number] Date of Birth/Sex: 03-09-1951 (67 y.o. Male) Treating RN: Montey Hora Primary Care Liller Yohn: BESS, KATY Other Clinician: Referring Kennidee Heyne: BESS, KATY Treating Evaleigh Mccamy/Extender: STONE III, HOYT Weeks in Treatment: 1 Wound Status Wound Number: 1 Primary Diabetic Wound/Ulcer of the Lower Extremity Etiology: Wound Location: Left Lower Leg - Midline, Anterior Secondary Trauma, Other Wounding Event: Trauma Etiology: Date Acquired: 04/05/2018 Wound Open Weeks Of Treatment: 1 Status: Clustered Wound: No Comorbid Lymphedema, Arrhythmia, Coronary Artery History: Disease, Hypertension, Myocardial Infarction, Type II Diabetes, Neuropathy, Received Chemotherapy, Received Radiation Photos Photo Uploaded By: Gretta Cool, BSN, RN, CWS, Kim on 04/26/2018 16:11:32 Wound Measurements Length: (cm) 2.1 Width: (cm) 1.7 Depth: (cm) 0.1 Area: (cm) 2.804 Volume: (cm) 0.28 % Reduction in Area: 6.1% % Reduction in Volume: 6% Epithelialization: None Tunneling: No Undermining: No Wound Description Classification: Grade 1 Wound Margin: Flat and Intact Exudate Amount: Small Exudate Type: Serous Exudate  Color: amber Foul Odor After Cleansing: No Slough/Fibrino Yes Wound Bed Granulation Amount: Medium (34-66%) Exposed Structure Granulation Quality: Pink Fascia Exposed: No Necrotic Amount: Medium (34-66%) Fat Layer (Subcutaneous Tissue) Exposed: Yes Necrotic Quality: Eschar, Adherent Slough Tendon Exposed: No Muscle Exposed: No Joint Exposed: No Pastrana, Luc T. ([id number]) Bone Exposed: No Periwound Skin Texture Texture Color No Abnormalities Noted: No No Abnormalities  Noted: No Callus: No Atrophie Blanche: No Crepitus: No Cyanosis: No Excoriation: No Ecchymosis: No Induration: No Erythema: No Rash: No Hemosiderin Staining: No Scarring: No Mottled: No Pallor: No Moisture Rubor: No No Abnormalities Noted: No Dry / Scaly: No Temperature / Pain Maceration: No Temperature: No Abnormality Tenderness on Palpation: Yes Wound Preparation Ulcer Cleansing: Rinsed/Irrigated with Saline Topical Anesthetic Applied: Other: lidocaine 4%, Treatment Notes Wound #1 (Left, Midline, Anterior Lower Leg) 1. Cleansed with: Clean wound with Normal Saline Cleanse wound with antibacterial soap and water 2. Anesthetic Topical Lidocaine 4% cream to wound bed prior to debridement 4. Dressing Applied: Iodoflex 5. Secondary Dressing Applied ABD Pad 7. Secured with Tape 3 Layer Compression System - Left Lower Extremity Notes unna to anchor, xtrasorb Electronic Signature(s) Signed: 04/26/2018 4:48:08 PM By: Montey Hora Entered By: Montey Hora on 04/26/2018 09:16:47 [name], [name] ([id number]) -------------------------------------------------------------------------------- Wound Assessment Details Patient Name: Marcus Loll T. Date of Service: 04/26/2018 8:45 AM Medical Record Number: [id number] Patient Account Number: [id number] Date of Birth/Sex: 1951-01-22 (67 y.o. Male) Treating RN: Montey Hora Primary Care Jaelyn Bourgoin: BESS, KATY Other Clinician: Referring Edyn Qazi:  BESS, KATY Treating Shantil Vallejo/Extender: STONE III, HOYT Weeks in Treatment: 1 Wound Status Wound Number: 2 Primary Diabetic Wound/Ulcer of the Lower Extremity Etiology: Wound Location: Left Toe Great Secondary Trauma, Other Wounding Event: Trauma Etiology: Date Acquired: 12/25/2017 Wound Open Weeks Of Treatment: 1 Status: Clustered Wound: No Comorbid Lymphedema, Arrhythmia, Coronary Artery Pending Amputation On Presentation History: Disease, Hypertension, Myocardial Infarction, Type II Diabetes, Neuropathy, Received Chemotherapy, Received Radiation Photos Photo Uploaded By: Gretta Cool, BSN, RN, CWS, Kim on 04/26/2018 16:12:01 Wound Measurements Length: (cm) 0.4 Width: (cm) 0.4 Depth: (cm) 0.3 Area: (cm) 0.126 Volume: (cm) 0.038 % Reduction in Area: -168.1% % Reduction in Volume: -660% Epithelialization: Medium (34-66%) Tunneling: No Undermining: No Wound Description Classification: Grade 1 Foul Odor Wound Margin: Flat and Intact Slough/Fi Exudate Amount: Large Exudate Type: Serous Exudate Color: amber After Cleansing: No brino Yes Wound Bed Granulation Amount: Small (1-33%) Exposed Structure Necrotic Amount: Large (67-100%) Fascia Exposed: No Necrotic Quality: Eschar, Adherent Slough Fat Layer (Subcutaneous Tissue) Exposed: Yes Tendon Exposed: No Muscle Exposed: No Joint Exposed: No Gross, [name] ([id number]) Bone Exposed: No Periwound Skin Texture Texture Color No Abnormalities Noted: No No Abnormalities Noted: No Callus: Yes Atrophie Blanche: No Crepitus: No Cyanosis: No Excoriation: No Ecchymosis: No Induration: No Erythema: No Rash: No Hemosiderin Staining: No Scarring: No Mottled: No Pallor: No Moisture Rubor: No No Abnormalities Noted: No Dry / Scaly: No Temperature / Pain Maceration: Yes Temperature: No Abnormality Wound Preparation Ulcer Cleansing: Rinsed/Irrigated with Saline Topical Anesthetic Applied: Other: lidocaine  4%, Treatment Notes Wound #2 (Left Toe Great) 1. Cleansed with: Clean wound with Normal Saline 2. Anesthetic Topical Lidocaine 4% cream to wound bed prior to debridement 4. Dressing Applied: Other dressing (specify in notes) 5. Secondary Dressing Applied Dry Gauze Kerlix/Conform 7. Secured with Tape Notes silvercel Electronic Signature(s) Signed: 04/26/2018 4:48:08 PM By: Montey Hora Entered By: Montey Hora on 04/26/2018 09:17:21 [name], [name] ([id number]) -------------------------------------------------------------------------------- [place] Details Patient Name: Marcus Lark. Date of Service: 04/26/2018 8:45 AM Medical Record Number: [id number] Patient Account Number: [id number] Date of Birth/Sex: Jul 03, 1951 (67 y.o. Male) Treating RN: Cornell Barman Primary Care Damika Harmon: BESS, KATY Other Clinician: Referring Colin Ellers: BESS, KATY Treating Onetta Spainhower/Extender: STONE III, HOYT Weeks in Treatment: 1 Vital Signs Time Taken: 08:49 Temperature (F): 97.7 Height (in): 69 Pulse (bpm): 75 Weight (lbs): 238 Respiratory Rate (breaths/min): 16 Body Mass Index (BMI): 35.1  Blood Pressure (mmHg): 125/67 Reference Range: 80 - 120 mg / dl Electronic Signature(s) Signed: 04/26/2018 5:40:02 PM By: Gretta Cool, BSN, RN, CWS, Kim RN, BSN Entered By: Gretta Cool, BSN, RN, CWS, Kim on 04/26/2018 08:49:58

## 2018-05-03 ENCOUNTER — Encounter: Payer: PPO | Admitting: Physician Assistant

## 2018-05-03 DIAGNOSIS — L97822 Non-pressure chronic ulcer of other part of left lower leg with fat layer exposed: Secondary | ICD-10-CM | POA: Diagnosis not present

## 2018-05-03 DIAGNOSIS — E11621 Type 2 diabetes mellitus with foot ulcer: Secondary | ICD-10-CM | POA: Diagnosis not present

## 2018-05-03 DIAGNOSIS — E11622 Type 2 diabetes mellitus with other skin ulcer: Secondary | ICD-10-CM | POA: Diagnosis not present

## 2018-05-03 DIAGNOSIS — L97522 Non-pressure chronic ulcer of other part of left foot with fat layer exposed: Secondary | ICD-10-CM | POA: Diagnosis not present

## 2018-05-09 NOTE — Progress Notes (Signed)
[name] ([id number]) Visit Report for 05/03/2018 Chief Complaint Document Details Patient Name: [name], [name]. Date of Service: 05/03/2018 8:45 AM Medical Record Number: [id number] Patient Account Number: [id number] Date of Birth/Sex: 1951-02-03 (67 y.o. M) Treating RN: Montey Hora Primary Care Provider: BESS, Valetta Fuller Other Clinician: Referring Provider: Lillard Anes Treating Provider/Extender: STONE III, HOYT Weeks in Treatment: 2 Information Obtained from: Patient Chief Complaint Left lower leg and left great toe ulcer Electronic Signature(s) Signed: 05/04/2018 10:27:37 AM By: Worthy Keeler PA-C Entered By: Worthy Keeler on 05/03/2018 09:13:06 [name] ([id number]) -------------------------------------------------------------------------------- Debridement Details Patient Name: [name]. Date of Service: 05/03/2018 8:45 AM Medical Record Number: [id number] Patient Account Number: [id number] Date of Birth/Sex: 1951/11/20 (67 y.o. M) Treating RN: Montey Hora Primary Care Provider: BESS, KATY Other Clinician: Referring Provider: BESS, KATY Treating Provider/Extender: STONE III, HOYT Weeks in Treatment: 2 Debridement Performed for Wound #1 Left,Midline,Anterior Lower Leg Assessment: Performed By: Physician STONE III, HOYT E., PA-C Debridement Type: Debridement Severity of Tissue Pre Fat layer exposed Debridement: Pre-procedure Verification/Time Yes - 09:22 Out Taken: Start Time: 09:22 Pain Control: Lidocaine 4% Topical Solution Total Area Debrided (L x W): 2.8 (cm) x 2 (cm) = 5.6 (cm) Tissue and other material Viable, Non-Viable, Subcutaneous, Biofilm, Fibrin/Exudate debrided: Level: Skin/Subcutaneous Tissue Debridement Description: Excisional Instrument: Curette Bleeding: Minimum Hemostasis Achieved: Pressure End Time: 09:25 Procedural Pain: 0 Post Procedural Pain: 0 Response to Treatment: Procedure was tolerated well Level of  Consciousness: Awake and Alert Post Procedure Vitals: Temperature: 97.6 Pulse: 73 Respiratory Rate: 20 Blood Pressure: Systolic Blood Pressure: 825 Diastolic Blood Pressure: 63 Post Debridement Measurements of Total Wound Length: (cm) 2.8 Width: (cm) 2 Depth: (cm) 0.2 Volume: (cm) 0.88 Character of Wound/Ulcer Post Debridement: Improved Severity of Tissue Post Debridement: Fat layer exposed Post Procedure Diagnosis Same as Pre-procedure Electronic Signature(s) Signed: 05/03/2018 4:42:41 PM By: Montey Hora Signed: 05/04/2018 10:27:37 AM By: Ralene Bathe ([id number]) Entered By: Montey Hora on 05/03/2018 09:25:28 [name] ([id number]) -------------------------------------------------------------------------------- Debridement Details Patient Name: Marcus Loll T. Date of Service: 05/03/2018 8:45 AM Medical Record Number: [id number] Patient Account Number: [id number] Date of Birth/Sex: 12-Mar-1951 (67 y.o. M) Treating RN: Montey Hora Primary Care Provider: BESS, KATY Other Clinician: Referring Provider: BESS, KATY Treating Provider/Extender: STONE III, HOYT Weeks in Treatment: 2 Debridement Performed for Wound #2 Left Toe Great Assessment: Performed By: Physician STONE III, HOYT E., PA-C Debridement Type: Debridement Severity of Tissue Pre Fat layer exposed Debridement: Pre-procedure Verification/Time Yes - 09:25 Out Taken: Start Time: 09:25 Pain Control: Lidocaine 4% Topical Solution Total Area Debrided (L x W): 0.4 (cm) x 0.4 (cm) = 0.16 (cm) Tissue and other material Viable, Non-Viable, Subcutaneous, Biofilm, Fibrin/Exudate debrided: Level: Skin/Subcutaneous Tissue Debridement Description: Excisional Instrument: Curette Bleeding: Minimum Hemostasis Achieved: Pressure End Time: 09:28 Procedural Pain: 0 Post Procedural Pain: 0 Response to Treatment: Procedure was tolerated well Level of Consciousness: Awake and  Alert Post Procedure Vitals: Temperature: 97.6 Pulse: 73 Respiratory Rate: 20 Blood Pressure: Systolic Blood Pressure: 426 Diastolic Blood Pressure: 63 Post Debridement Measurements of Total Wound Length: (cm) 0.4 Width: (cm) 0.4 Depth: (cm) 0.4 Volume: (cm) 0.05 Character of Wound/Ulcer Post Debridement: Improved Severity of Tissue Post Debridement: Fat layer exposed Post Procedure Diagnosis Same as Pre-procedure Electronic Signature(s) Signed: 05/03/2018 4:42:41 PM By: Montey Hora Signed: 05/04/2018 10:27:37 AM By: Ralene Bathe ([id number]) Entered By: Montey Hora on 05/03/2018 09:28:13 [name] ([id number]) -------------------------------------------------------------------------------- HPI Details  Patient Name: [name], [name]. Date of Service: 05/03/2018 8:45 AM Medical Record Number: [id number] Patient Account Number: [id number] Date of Birth/Sex: 03/31/1951 (67 y.o. M) Treating RN: Montey Hora Primary Care Provider: BESS, KATY Other Clinician: Referring Provider: BESS, KATY Treating Provider/Extender: Melburn Hake, HOYT Weeks in Treatment: 2 History of Present Illness Associated Signs and Symptoms: Patient has a history of chronic venous insufficiency, diabetes mellitus type II, its reform fraction, hypertension HPI Description: 04/19/18 on evaluation today patient presents initially concerning an ulcer of his left great toe and left anterior shin. He states that the shin was definitely a trauma injury where he struck this on a metal pole injuring leg. In regard to the distal left great toe he's really not sure what happened here although he does have a significant callous noted as well. He does have a pacemaker. Fortunately his ABI was 0.98 on the right and 0.83 on the left this appears to be doing fairly well. Overall I'm pleased with that. Nonetheless he states that this really has not seem to be improving. He has been tolerating  the dressing changes without complication. The left anterior leg ulcer began on 04/05/18 according to what he tells [place] today although one note I did have for review said it was April 5. I'm inclined to believe it was the fifth she did have an office visit on 04/01/18 with his family nurse practitioner Faith Rogue. Nonetheless either way it's been going on this month for several weeks. The left great toe has actually been present since January 1 he does not know how this occurred. Fortunately has no pain at the site although he does have pain on the left anterior shin. His hemoglobin A1c is 8.0 in March 2019. 04/26/18 on evaluation today patient presents for follow-up concerning his life into your [place] also as well as his left great toe also. Fortunately the shin actually appears to be doing better on evaluation today in my opinion I'm not seeing as much in the way of fluffernutter on the surface although it does still require debridement today this is improved. Size wise it's really not much different. With that being said the total ulcer actually appears to be a little bit more macerated today I'm not sure why they state that several days ago when this was changed that was not the case. Fortunately is not having severe pain although both areas do hurt the shin is worse. 05/03/18 on evaluation today patient appears to be doing better in regard to his left first toe and left shin ulcers. He is been tolerating the dressing changes without complication and the good news is this seems to be showing signs of getting better. Overall I'm very pleased with the progress that has been made over the past week. Patient is still having pain especially in regard to the left shin. Electronic Signature(s) Signed: 05/04/2018 10:27:37 AM By: Worthy Keeler PA-C Entered By: Worthy Keeler on 05/04/2018 09:15:04 Guerry, [name]  ([id number]) -------------------------------------------------------------------------------- Physical Exam Details Patient Name: Marcus Loll T. Date of Service: 05/03/2018 8:45 AM Medical Record Number: [id number] Patient Account Number: [id number] Date of Birth/Sex: 1951-01-13 (67 y.o. M) Treating RN: Montey Hora Primary Care Provider: BESS, Valetta Fuller Other Clinician: Referring Provider: BESS, KATY Treating Provider/Extender: STONE III, HOYT Weeks in Treatment: 2 Constitutional Well-nourished and well-hydrated in no acute distress. Respiratory normal breathing without difficulty. Psychiatric this patient is able to make decisions and demonstrates good insight into disease process. Alert and Oriented x 3.  pleasant and cooperative. Notes At this point I did perform sharp debridement at both sites and post debridement both appear to be doing better which is good news. Overall I'm happy with the progress that he is making and hope that we can continue to see some additional granulation and filling in in regard to both ulcers. Electronic Signature(s) Signed: 05/04/2018 10:27:37 AM By: Worthy Keeler PA-C Entered By: Worthy Keeler on 05/04/2018 09:16:02 Camero, [name] ([id number]) -------------------------------------------------------------------------------- Physician Orders Details Patient Name: [name]. Date of Service: 05/03/2018 8:45 AM Medical Record Number: [id number] Patient Account Number: [id number] Date of Birth/Sex: 08/06/1951 (67 y.o. M) Treating RN: Montey Hora Primary Care Provider: BESS, KATY Other Clinician: Referring Provider: BESS, KATY Treating Provider/Extender: Melburn Hake, HOYT Weeks in Treatment: 2 Verbal / Phone Orders: No Diagnosis Coding ICD-10 Coding Code Description I87.2 Venous insufficiency (chronic) (peripheral) S81.812A Laceration without foreign body, left lower leg, initial encounter L97.822 Non-pressure chronic ulcer of other part  of left lower leg with fat layer exposed L97.521 Non-pressure chronic ulcer of other part of left foot limited to breakdown of skin E11.621 Type 2 diabetes mellitus with foot ulcer I63.9 Cerebral infarction, unspecified I10 Essential (primary) hypertension Wound Cleansing Wound #1 Left,Midline,Anterior Lower Leg o Cleanse wound with mild soap and water o May shower with protection. Wound #2 Left Toe Great o Cleanse wound with mild soap and water o May Shower, gently pat wound dry prior to applying new dressing. Anesthetic (add to Medication List) Wound #1 Left,Midline,Anterior Lower Leg o Topical Lidocaine 4% cream applied to wound bed prior to debridement (In Clinic Only). Wound #2 Left Toe Great o Topical Lidocaine 4% cream applied to wound bed prior to debridement (In Clinic Only). Primary Wound Dressing Wound #1 Left,Midline,Anterior Lower Leg o Silver Collagen Wound #2 Left Toe Great o Silver Collagen Secondary Dressing Wound #1 Left,Midline,Anterior Lower Leg o ABD pad Wound #2 Left Toe Great o Gauze and Kerlix/Conform - secure with coban Dressing Change Frequency Dillow, [name] ([id number]) Wound #1 Left,Midline,Anterior Lower Leg o Change dressing every week Wound #2 Left Toe Great o Change dressing every other day. Follow-up Appointments Wound #1 Left,Midline,Anterior Lower Leg o Return Appointment in 1 week. Wound #2 Left Toe Great o Return Appointment in 1 week. Edema Control Wound #1 Left,Midline,Anterior Lower Leg o 3 Layer Compression System - Left Lower Extremity o Patient to wear own compression stockings - right lower leg o Elevate legs to the level of the heart and pump ankles as often as possible Additional Orders / Instructions Wound #1 Left,Midline,Anterior Lower Leg o Stop Smoking o Vitamin A; Vitamin C, Zinc - Please add a multivitamin with 100% of these o Increase protein intake. o Activity as  tolerated Wound #2 Left Toe Great o Stop Smoking o Vitamin A; Vitamin C, Zinc - Please add a multivitamin with 100% of these o Increase protein intake. o Activity as tolerated Electronic Signature(s) Signed: 05/03/2018 4:42:41 PM By: Montey Hora Signed: 05/04/2018 10:27:37 AM By: Worthy Keeler PA-C Entered By: Montey Hora on 05/03/2018 09:28:49 [name] ([id number]) -------------------------------------------------------------------------------- Problem List Details Patient Name: Marcus Loll T. Date of Service: 05/03/2018 8:45 AM Medical Record Number: [id number] Patient Account Number: [id number] Date of Birth/Sex: 18-Feb-1951 (67 y.o. M) Treating RN: Montey Hora Primary Care Provider: BESS, Valetta Fuller Other Clinician: Referring Provider: BESS, KATY Treating Provider/Extender: Melburn Hake, HOYT Weeks in Treatment: 2 Active Problems ICD-10 Impacting Encounter Code Description Active Date Wound Healing Diagnosis I87.2 Venous insufficiency (chronic) (  peripheral) 04/19/2018 Yes S81.812A Laceration without foreign body, left lower leg, initial 04/19/2018 Yes encounter L97.822 Non-pressure chronic ulcer of other part of left lower leg with 04/19/2018 Yes fat layer exposed L97.521 Non-pressure chronic ulcer of other part of left foot limited to 04/19/2018 Yes breakdown of skin E11.621 Type 2 diabetes mellitus with foot ulcer 04/19/2018 Yes I63.9 Cerebral infarction, unspecified 04/19/2018 Yes I10 Essential (primary) hypertension 04/19/2018 Yes Inactive Problems Resolved Problems Electronic Signature(s) Signed: 05/04/2018 10:27:37 AM By: Worthy Keeler PA-C Entered By: Worthy Keeler on 05/03/2018 09:12:51 Barge, [name] ([id number]) -------------------------------------------------------------------------------- Progress Note Details Patient Name: Marcus Loll T. Date of Service: 05/03/2018 8:45 AM Medical Record Number: [id number] Patient Account Number:  [id number] Date of Birth/Sex: 1951-02-18 (67 y.o. M) Treating RN: Montey Hora Primary Care Provider: BESS, KATY Other Clinician: Referring Provider: BESS, KATY Treating Provider/Extender: Melburn Hake, HOYT Weeks in Treatment: 2 Subjective Chief Complaint Information obtained from Patient Left lower leg and left great toe ulcer History of Present Illness (HPI) The following HPI elements were documented for the patient's wound: Associated Signs and Symptoms: Patient has a history of chronic venous insufficiency, diabetes mellitus type II, its reform fraction, hypertension 04/19/18 on evaluation today patient presents initially concerning an ulcer of his left great toe and left anterior shin. He states that the shin was definitely a trauma injury where he struck this on a metal pole injuring leg. In regard to the distal left great toe he's really not sure what happened here although he does have a significant callous noted as well. He does have a pacemaker. Fortunately his ABI was 0.98 on the right and 0.83 on the left this appears to be doing fairly well. Overall I'm pleased with that. Nonetheless he states that this really has not seem to be improving. He has been tolerating the dressing changes without complication. The left anterior leg ulcer began on 04/05/18 according to what he tells [place] today although one note I did have for review said it was April 5. I'm inclined to believe it was the fifth she did have an office visit on 04/01/18 with his family nurse practitioner Faith Rogue. Nonetheless either way it's been going on this month for several weeks. The left great toe has actually been present since January 1 he does not know how this occurred. Fortunately has no pain at the site although he does have pain on the left anterior shin. His hemoglobin A1c is 8.0 in March 2019. 04/26/18 on evaluation today patient presents for follow-up concerning his life into your [place] also as well as  his left great toe also. Fortunately the shin actually appears to be doing better on evaluation today in my opinion I'm not seeing as much in the way of fluffernutter on the surface although it does still require debridement today this is improved. Size wise it's really not much different. With that being said the total ulcer actually appears to be a little bit more macerated today I'm not sure why they state that several days ago when this was changed that was not the case. Fortunately is not having severe pain although both areas do hurt the shin is worse. 05/03/18 on evaluation today patient appears to be doing better in regard to his left first toe and left shin ulcers. He is been tolerating the dressing changes without complication and the good news is this seems to be showing signs of getting better. Overall I'm very pleased with the progress that has been made  over the past week. Patient is still having pain especially in regard to the left shin. Patient History Information obtained from Patient. Family History Cancer - Father, Diabetes - Father, Heart Disease - Mother,Father, Hypertension - Mother, Kidney Disease - Child, No family history of Lung Disease, Seizures, Stroke, Thyroid Problems, Tuberculosis. Social History Current every day smoker - 1 - 1/2 pack daily, Marital Status - Married, Alcohol Use - Rarely, Drug Use - No History, Caffeine Use - Daily - coffee. Review of Systems (ROS) Constitutional Symptoms (General Health) [place], [name]. ([id number]) Denies complaints or symptoms of Fever, Chills. Respiratory The patient has no complaints or symptoms. Cardiovascular The patient has no complaints or symptoms. Psychiatric The patient has no complaints or symptoms. Objective Constitutional Well-nourished and well-hydrated in no acute distress. Vitals Time Taken: 8:45 AM, Height: 69 in, Weight: 238 lbs, BMI: 35.1, Temperature: 97.6 F, Pulse: 73 bpm, Respiratory Rate: 18  breaths/min, Blood Pressure: 119/63 mmHg. Respiratory normal breathing without difficulty. Psychiatric this patient is able to make decisions and demonstrates good insight into disease process. Alert and Oriented x 3. pleasant and cooperative. General Notes: At this point I did perform sharp debridement at both sites and post debridement both appear to be doing better which is good news. Overall I'm happy with the progress that he is making and hope that we can continue to see some additional granulation and filling in in regard to both ulcers. Integumentary (Hair, Skin) Wound #1 status is Open. Original cause of wound was Trauma. The wound is located on the Left,Midline,Anterior Lower Leg. The wound measures 2.8cm length x 2cm width x 0.1cm depth; 4.398cm^2 area and 0.44cm^3 volume. Wound #2 status is Open. Original cause of wound was Trauma. The wound is located on the Left Toe Great. The wound measures 0.4cm length x 0.4cm width x 0.3cm depth; 0.126cm^2 area and 0.038cm^3 volume. Assessment Active Problems ICD-10 I87.2 - Venous insufficiency (chronic) (peripheral) N62.952W - Laceration without foreign body, left lower leg, initial encounter L97.822 - Non-pressure chronic ulcer of other part of left lower leg with fat layer exposed L97.521 - Non-pressure chronic ulcer of other part of left foot limited to breakdown of skin E11.621 - Type 2 diabetes mellitus with foot ulcer I63.9 - Cerebral infarction, unspecified Allums, [name] ([id number]) I10 - Essential (primary) hypertension Procedures Wound #1 Pre-procedure diagnosis of Wound #1 is a Diabetic Wound/Ulcer of the Lower Extremity located on the Left,Midline,Anterior Lower Leg .Severity of Tissue Pre Debridement is: Fat layer exposed. There was a Excisional Skin/Subcutaneous Tissue Debridement with a total area of 5.6 sq cm performed by STONE III, HOYT E., PA-C. With the following instrument(s): Curette to remove Viable and  Non-Viable tissue/material. Material removed includes Subcutaneous Tissue, Biofilm, and Fibrin/Exudate after achieving pain control using Lidocaine 4% Topical Solution. No specimens were taken. A time out was conducted at 09:22, prior to the start of the procedure. A Minimum amount of bleeding was controlled with Pressure. The procedure was tolerated well with a pain level of 0 throughout and a pain level of 0 following the procedure. Patient s Level of Consciousness post procedure was recorded as Awake and Alert and post-procedure vitals were taken including Temperature: 97.6 F, Pulse: 73 bpm, Respiratory Rate: 20 breaths/min, Blood Pressure: (119)/(63) mmHg. Post Debridement Measurements: 2.8cm length x 2cm width x 0.2cm depth; 0.88cm^3 volume. Character of Wound/Ulcer Post Debridement is improved. Severity of Tissue Post Debridement is: Fat layer exposed. Post procedure Diagnosis Wound #1: Same as Pre-Procedure Wound #2 Pre-procedure  diagnosis of Wound #2 is a Diabetic Wound/Ulcer of the Lower Extremity located on the Left Toe Great .Severity of Tissue Pre Debridement is: Fat layer exposed. There was a Excisional Skin/Subcutaneous Tissue Debridement with a total area of 0.16 sq cm performed by STONE III, HOYT E., PA-C. With the following instrument(s): Curette to remove Viable and Non-Viable tissue/material. Material removed includes Subcutaneous Tissue, Biofilm, and Fibrin/Exudate after achieving pain control using Lidocaine 4% Topical Solution. No specimens were taken. A time out was conducted at 09:25, prior to the start of the procedure. A Minimum amount of bleeding was controlled with Pressure. The procedure was tolerated well with a pain level of 0 throughout and a pain level of 0 following the procedure. Patient s Level of Consciousness post procedure was recorded as Awake and Alert and post-procedure vitals were taken including Temperature: 97.6 F, Pulse: 73 bpm, Respiratory Rate: 20  breaths/min, Blood Pressure: (119)/(63) mmHg. Post Debridement Measurements: 0.4cm length x 0.4cm width x 0.4cm depth; 0.05cm^3 volume. Character of Wound/Ulcer Post Debridement is improved. Severity of Tissue Post Debridement is: Fat layer exposed. Post procedure Diagnosis Wound #2: Same as Pre-Procedure Plan Wound Cleansing: Wound #1 Left,Midline,Anterior Lower Leg: Cleanse wound with mild soap and water May shower with protection. Wound #2 Left Toe Great: Cleanse wound with mild soap and water May Shower, gently pat wound dry prior to applying new dressing. Anesthetic (add to Medication List): Wound #1 Left,Midline,Anterior Lower Leg: Topical Lidocaine 4% cream applied to wound bed prior to debridement (In Clinic Only). Wound #2 Left Toe Great: Topical Lidocaine 4% cream applied to wound bed prior to debridement (In Clinic Only). Primary Wound Dressing: YUVAAN, [name] ([id number]) Wound #1 Left,Midline,Anterior Lower Leg: Silver Collagen Wound #2 Left Toe Great: Silver Collagen Secondary Dressing: Wound #1 Left,Midline,Anterior Lower Leg: ABD pad Wound #2 Left Toe Great: Gauze and Kerlix/Conform - secure with coban Dressing Change Frequency: Wound #1 Left,Midline,Anterior Lower Leg: Change dressing every week Wound #2 Left Toe Great: Change dressing every other day. Follow-up Appointments: Wound #1 Left,Midline,Anterior Lower Leg: Return Appointment in 1 week. Wound #2 Left Toe Great: Return Appointment in 1 week. Edema Control: Wound #1 Left,Midline,Anterior Lower Leg: 3 Layer Compression System - Left Lower Extremity Patient to wear own compression stockings - right lower leg Elevate legs to the level of the heart and pump ankles as often as possible Additional Orders / Instructions: Wound #1 Left,Midline,Anterior Lower Leg: Stop Smoking Vitamin A; Vitamin C, Zinc - Please add a multivitamin with 100% of these Increase protein intake. Activity as  tolerated Wound #2 Left Toe Great: Stop Smoking Vitamin A; Vitamin C, Zinc - Please add a multivitamin with 100% of these Increase protein intake. Activity as tolerated I'm gonna suggest that we switch to [facility] for both locations as I feel this will likely help to get this to fill in more quickly and hopefully resolve the wounds in a rapid manner. Patient is in agreement the plan. We will subsequently see were things stand in one weeks time we see him for reevaluation. Please see above for specific wound care orders. We will see patient for re-evaluation in 1 week(s) here in the clinic. If anything worsens or changes patient will contact our office for additional recommendations. Electronic Signature(s) Signed: 05/04/2018 10:27:37 AM By: Worthy Keeler PA-C Entered By: Worthy Keeler on 05/04/2018 09:16:24 [name] ([id number]) -------------------------------------------------------------------------------- ROS/PFSH Details Patient Name: [name]. Date of Service: 05/03/2018 8:45 AM Medical Record Number: [id number] Patient Account  Number: [id number] Date of Birth/Sex: [birth year] (67 y.o. M) Treating RN: Montey Hora Primary Care Provider: BESS, KATY Other Clinician: Referring Provider: BESS, KATY Treating Provider/Extender: STONE III, HOYT Weeks in Treatment: 2 Information Obtained From Patient Wound History Do you currently have one or more open woundso Yes How many open wounds do you currently haveo 2 Approximately how long have you had your woundso 2 weeks How have you been treating your wound(s) until nowo yes Doctor Has your wound(s) ever healed and then re-openedo No Have you had any lab work done in the past montho No Have you tested positive for an antibiotic resistant organism (MRSA, VRE)o No Have you tested positive for osteomyelitis (bone infection)o No Have you had any tests for circulation on your legso No Have you had other problems associated  with your woundso Infection, Swelling Constitutional Symptoms (General Health) Complaints and Symptoms: Negative for: Fever; Chills Eyes Medical History: Negative for: Cataracts; Glaucoma; Optic Neuritis Ear/Nose/Mouth/Throat Medical History: Negative for: Chronic sinus problems/congestion Hematologic/Lymphatic Medical History: Positive for: Lymphedema Negative for: Anemia; Hemophilia; Human Immunodeficiency Virus; Sickle Cell Disease Respiratory Complaints and Symptoms: No Complaints or Symptoms Medical History: Negative for: Aspiration; Asthma; Chronic Obstructive Pulmonary Disease (COPD); Pneumothorax; Sleep Apnea; Tuberculosis Cardiovascular Complaints and Symptoms: No Complaints or Symptoms Medical History: [name], [name] ([id number]) Positive for: Arrhythmia - pacemaker; Coronary Artery Disease; Hypertension; Myocardial Infarction Negative for: Deep Vein Thrombosis; Hypotension; Peripheral Arterial Disease; Peripheral Venous Disease; Phlebitis; Vasculitis Gastrointestinal Medical History: Negative for: Cirrhosis ; Colitis; Crohnos; Hepatitis A; Hepatitis B; Hepatitis C Endocrine Medical History: Positive for: Type II Diabetes Negative for: Type I Diabetes Time with diabetes: 5 years Treated with: Oral agents Blood sugar tested every day: Yes Tested : daily AM Blood sugar testing results: Breakfast: 174 Genitourinary Medical History: Negative for: End Stage Renal Disease Immunological Medical History: Negative for: Lupus Erythematosus; Raynaudos; Scleroderma Integumentary (Skin) Medical History: Negative for: History of Burn; History of pressure wounds Musculoskeletal Medical History: Negative for: Gout; Rheumatoid Arthritis; Osteoarthritis; Osteomyelitis Neurologic Medical History: Positive for: Neuropathy Negative for: Dementia; Quadriplegia; Paraplegia; Seizure Disorder Oncologic Medical History: Positive for: Received Chemotherapy; Received  Radiation - Seeds prostate Psychiatric Complaints and Symptoms: No Complaints or Symptoms Medical History: Negative for: Anorexia/bulimia; Confinement Anxiety [name], [name] ([id number]) Immunizations Pneumococcal Vaccine: Received Pneumococcal Vaccination: Yes Tetanus Vaccine: Last tetanus shot: 12/25/2017 Implantable Devices Family and Social History Cancer: Yes - Father; Diabetes: Yes - Father; Heart Disease: Yes - Mother,Father; Hypertension: Yes - Mother; Kidney Disease: Yes - Child; Lung Disease: No; Seizures: No; Stroke: No; Thyroid Problems: No; Tuberculosis: No; Current every day smoker - 1 - 1/2 pack daily; Marital Status - Married; Alcohol Use: Rarely; Drug Use: No History; Caffeine Use: Daily - coffee; Financial Concerns: No; Food, Clothing or Shelter Needs: No; Support System Lacking: No; Transportation Concerns: No Physician Affirmation I have reviewed and agree with the above information. Electronic Signature(s) Signed: 05/04/2018 10:27:37 AM By: Worthy Keeler PA-C Signed: 05/08/2018 4:31:04 PM By: Montey Hora Entered By: Worthy Keeler on 05/04/2018 09:15:40 [name], [name] ([id number]) -------------------------------------------------------------------------------- SuperBill Details Patient Name: [name]. Date of Service: 05/03/2018 Medical Record Number: [id number] Patient Account Number: [id number] Date of Birth/Sex: 26-Aug-1951 (67 y.o. 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 E11.621 Type 2 diabetes mellitus with foot ulcer I63.9 Cerebral infarction, unspecified I10 Essential (primary) hypertension Facility Procedures CPT4 Code Description: [id number] 11042 - DEB SUBQ TISSUE 20 SQ CM/< ICD-10 Diagnosis Description L97.822 Non-pressure chronic ulcer of other part of left lower leg with L97.521 Non-pressure chronic ulcer of other part of left foot limited t Modifier: fat layer expos o breakdown of s Quantity: 1 ed kin Physician Procedures CPT4 Code Description: [id number] - WC PHYS SUBQ TISS 20 SQ CM ICD-10 Diagnosis Description L97.822 Non-pressure chronic ulcer of other part of left lower leg with L97.521 Non-pressure chronic ulcer of other part of left foot limited t Modifier: fat layer expose o breakdown of sk Quantity: 1 d in Electronic Signature(s) Signed: 05/04/2018 10:27:37 AM By: Worthy Keeler PA-C Entered By: Worthy Keeler on 05/04/2018 09:16:51

## 2018-05-10 ENCOUNTER — Encounter: Payer: PPO | Admitting: Physician Assistant

## 2018-05-10 DIAGNOSIS — L97822 Non-pressure chronic ulcer of other part of left lower leg with fat layer exposed: Secondary | ICD-10-CM | POA: Diagnosis not present

## 2018-05-10 DIAGNOSIS — E11622 Type 2 diabetes mellitus with other skin ulcer: Secondary | ICD-10-CM | POA: Diagnosis not present

## 2018-05-12 NOTE — Progress Notes (Signed)
LEVERNE, AMRHEIN ([id number]) Visit Report for 05/10/2018 Chief Complaint Document Details Patient Name: GLENDALE, YOUNGBLOOD. Date of Service: 05/10/2018 9:00 AM Medical Record Number: [id number] Patient Account Number: [id number] Date of Birth/Sex: 10/30/1951 (67 y.o. M) Treating RN: Montey Hora Primary Care Provider: BESS, Valetta Fuller Other Clinician: Referring Provider: BESS, KATY Treating Provider/Extender: STONE III, Meha Vidrine Weeks in Treatment: 3 Information Obtained from: Patient Chief Complaint Left lower leg and left great toe ulcer Electronic Signature(s) Signed: 05/11/2018 1:18:07 PM By: Worthy Keeler PA-C Entered By: Worthy Keeler on 05/10/2018 09:29:47 Lafortune, Huntley Dec ([id number]) -------------------------------------------------------------------------------- Debridement Details Patient Name: [name] Date of Service: 05/10/2018 9:00 AM Medical Record Number: [id number] Patient Account Number: [id number] Date of Birth/Sex: 1951/03/13 (67 y.o. M) Treating RN: Montey Hora Primary Care Provider: BESS, KATY Other Clinician: Referring Provider: BESS, KATY Treating Provider/Extender: STONE III, Krystofer Hevener Weeks in Treatment: 3 Debridement Performed for Wound #1 Left,Midline,Anterior Lower Leg Assessment: Performed By: Physician STONE III, Trinna Kunst E., PA-C Debridement Type: Debridement Severity of Tissue Pre Fat layer exposed Debridement: Pre-procedure Verification/Time Yes - 09:33 Out Taken: Start Time: 09:33 Pain Control: Lidocaine 4% Topical Solution Total Area Debrided (L x W): 2 (cm) x 1.8 (cm) = 3.6 (cm) Tissue and other material Viable, Non-Viable, Slough, Subcutaneous, Fibrin/Exudate, Slough debrided: Level: Skin/Subcutaneous Tissue Debridement Description: Excisional Instrument: Curette Bleeding: Minimum Hemostasis Achieved: Pressure End Time: 09:37 Procedural Pain: 0 Post Procedural Pain: 0 Response to Treatment: Procedure was tolerated well Level of  Consciousness: Awake and Alert Post Procedure Vitals: Temperature: 97.9 Pulse: 70 Respiratory Rate: 16 Blood Pressure: Systolic Blood Pressure: 937 Diastolic Blood Pressure: 68 Post Debridement Measurements of Total Wound Length: (cm) 2 Width: (cm) 1.8 Depth: (cm) 0.2 Volume: (cm) 0.565 Character of Wound/Ulcer Post Debridement: Improved Severity of Tissue Post Debridement: Fat layer exposed Post Procedure Diagnosis Same as Pre-procedure Electronic Signature(s) Signed: 05/10/2018 4:56:04 PM By: Montey Hora Signed: 05/11/2018 1:18:07 PM By: Ralene Bathe ([id number]) Entered By: Montey Hora on 05/10/2018 09:37:09 Stuller, Huntley Dec ([id number]) -------------------------------------------------------------------------------- HPI Details Patient Name: [name] Date of Service: 05/10/2018 9:00 AM Medical Record Number: [id number] Patient Account Number: [id number] Date of Birth/Sex: January 05, 1951 (67 y.o. M) Treating RN: Montey Hora Primary Care Provider: BESS, KATY Other Clinician: Referring Provider: BESS, KATY Treating Provider/Extender: Melburn Hake, Jianna Drabik Weeks in Treatment: 3 History of Present Illness Associated Signs and Symptoms: Patient has a history of chronic venous insufficiency, diabetes mellitus type II, its reform fraction, hypertension HPI Description: 04/19/18 on evaluation today patient presents initially concerning an ulcer of his left great toe and left anterior shin. He states that the shin was definitely a trauma injury where he struck this on a metal pole injuring leg. In regard to the distal left great toe he's really not sure what happened here although he does have a significant callous noted as well. He does have a pacemaker. Fortunately his ABI was 0.98 on the right and 0.83 on the left this appears to be doing fairly well. Overall I'm pleased with that. Nonetheless he states that this really has not seem to be improving.  He has been tolerating the dressing changes without complication. The left anterior leg ulcer began on 04/05/18 according to what he tells [place] today although one note I did have for review said it was April 5. I'm inclined to believe it was the fifth she did have an office visit on 04/01/18 with his family nurse practitioner Faith Rogue. Nonetheless either way it's been  going on this month for several weeks. The left great toe has actually been present since January 1 he does not know how this occurred. Fortunately has no pain at the site although he does have pain on the left anterior shin. His hemoglobin A1c is 8.0 in March 2019. 04/26/18 on evaluation today patient presents for follow-up concerning his life into your [place] also as well as his left great toe also. Fortunately the shin actually appears to be doing better on evaluation today in my opinion I'm not seeing as much in the way of fluffernutter on the surface although it does still require debridement today this is improved. Size wise it's really not much different. With that being said the total ulcer actually appears to be a little bit more macerated today I'm not sure why they state that several days ago when this was changed that was not the case. Fortunately is not having severe pain although both areas do hurt the shin is worse. 05/03/18 on evaluation today patient appears to be doing better in regard to his left first toe and left shin ulcers. He is been tolerating the dressing changes without complication and the good news is this seems to be showing signs of getting better. Overall I'm very pleased with the progress that has been made over the past week. Patient is still having pain especially in regard to the left shin. 05/10/18 on evaluation today patient appears to be doing rather well in regard to his two ulcers. The toe ulcer appears to be smaller he does have some undermining however in this had to be cleaned out just a  little bit. With that being said overall I feel like this is showing signs of improvement. The left anterior shin ulcer also showing signs of improvement at this time. There does not appear to be any evidence of infection which is good news. Electronic Signature(s) Signed: 05/11/2018 1:18:07 PM By: Worthy Keeler PA-C Entered By: Worthy Keeler on 05/10/2018 09:40:29 Lorson, Huntley Dec ([id number]) -------------------------------------------------------------------------------- Physical Exam Details Patient Name: [name] Date of Service: 05/10/2018 9:00 AM Medical Record Number: [id number] Patient Account Number: [id number] Date of Birth/Sex: [birth year] (67 y.o. M) Treating RN: Montey Hora Primary Care Provider: BESS, Valetta Fuller Other Clinician: Referring Provider: BESS, KATY Treating Provider/Extender: STONE III, Mayline Dragon Weeks in Treatment: 3 Constitutional Well-nourished and well-hydrated in no acute distress. Respiratory normal breathing without difficulty. Psychiatric this patient is able to make decisions and demonstrates good insight into disease process. Alert and Oriented x 3. pleasant and cooperative. Notes Currently patient's wounds both did have some Slough noted I was not however able to get a curette into the toe ulcer to clear this out due to the size of the wound at this point. I did mechanically debride this with saline and gauze along with the backend of a Q-tip today. He tolerated this without any significant pain. The left anterior shin however was sharply debrided with good result post debridement both appear to be doing better. Electronic Signature(s) Signed: 05/11/2018 1:18:07 PM By: Worthy Keeler PA-C Entered By: Worthy Keeler on 05/10/2018 09:41:25 Leu, Huntley Dec ([id number]) -------------------------------------------------------------------------------- Physician Orders Details Patient Name: [name] Date of Service: 05/10/2018 9:00  AM Medical Record Number: [id number] Patient Account Number: [id number] Date of Birth/Sex: 13-May-1951 (67 y.o. M) Treating RN: Montey Hora Primary Care Provider: BESS, KATY Other Clinician: Referring Provider: BESS, KATY Treating Provider/Extender: STONE III, Nikoloz Huy Weeks in Treatment: 3 Verbal / Phone Orders: No  Diagnosis Coding ICD-10 Coding Code Description I87.2 Venous insufficiency (chronic) (peripheral) D63.875I Laceration without foreign body, left lower leg, initial encounter [id number] Non-pressure chronic ulcer of other part of left lower leg with fat layer exposed L97.521 Non-pressure chronic ulcer of other part of left foot limited to breakdown of skin E11.621 Type 2 diabetes mellitus with foot ulcer I63.9 Cerebral infarction, unspecified I10 Essential (primary) hypertension Wound Cleansing Wound #1 Left,Midline,Anterior Lower Leg o Cleanse wound with mild soap and water o May shower with protection. Wound #2 Left Toe Great o Cleanse wound with mild soap and water o May Shower, gently pat wound dry prior to applying new dressing. Anesthetic (add to Medication List) Wound #1 Left,Midline,Anterior Lower Leg o Topical Lidocaine 4% cream applied to wound bed prior to debridement (In Clinic Only). Wound #2 Left Toe Great o Topical Lidocaine 4% cream applied to wound bed prior to debridement (In Clinic Only). Primary Wound Dressing Wound #1 Left,Midline,Anterior Lower Leg o Silver Collagen Wound #2 Left Toe Great o Silver Collagen Secondary Dressing Wound #1 Left,Midline,Anterior Lower Leg o ABD pad Wound #2 Left Toe Great o Gauze and Kerlix/Conform - secure with coban o Foam Cadotte, Huntley Dec ([id number]) Dressing Change Frequency Wound #1 Left,Midline,Anterior Lower Leg o Change dressing every week Wound #2 Left Toe Great o Change dressing every other day. Follow-up Appointments Wound #1 Left,Midline,Anterior Lower Leg o Return  Appointment in 2 weeks. o Nurse Visit as needed Wound #2 Left Toe Great o Return Appointment in 2 weeks. o Nurse Visit as needed Edema Control Wound #1 Left,Midline,Anterior Lower Leg o 3 Layer Compression System - Left Lower Extremity o Patient to wear own compression stockings - right lower leg o Elevate legs to the level of the heart and pump ankles as often as possible Additional Orders / Instructions Wound #1 Left,Midline,Anterior Lower Leg o Stop Smoking o Vitamin A; Vitamin C, Zinc - Please add a multivitamin with 100% of these o Increase protein intake. o Activity as tolerated Wound #2 Left Toe Great o Stop Smoking o Vitamin A; Vitamin C, Zinc - Please add a multivitamin with 100% of these o Increase protein intake. o Activity as tolerated Electronic Signature(s) Signed: 05/10/2018 4:56:04 PM By: Montey Hora Signed: 05/11/2018 1:18:07 PM By: Worthy Keeler PA-C Entered By: Montey Hora on 05/10/2018 09:38:41 Willbanks, Huntley Dec ([id number]) -------------------------------------------------------------------------------- Problem List Details Patient Name: [name] Date of Service: 05/10/2018 9:00 AM Medical Record Number: [id number] Patient Account Number: [id number] Date of Birth/Sex: [birth year] (67 y.o. M) Treating RN: Montey Hora Primary Care Provider: BESS, Valetta Fuller Other Clinician: Referring Provider: BESS, KATY Treating Provider/Extender: Melburn Hake, Ciclaly Mulcahey Weeks in Treatment: 3 Active Problems ICD-10 Impacting Encounter Code Description Active Date Wound Healing Diagnosis I87.2 Venous insufficiency (chronic) (peripheral) 04/19/2018 Yes U54.270W Laceration without foreign body, left lower leg, initial 04/19/2018 Yes encounter L97.822 Non-pressure chronic ulcer of other part of left lower leg with 04/19/2018 Yes fat layer exposed L97.521 Non-pressure chronic ulcer of other part of left foot limited to 04/19/2018 Yes breakdown of  skin E11.621 Type 2 diabetes mellitus with foot ulcer 04/19/2018 Yes I63.9 Cerebral infarction, unspecified 04/19/2018 Yes I10 Essential (primary) hypertension 04/19/2018 Yes Inactive Problems Resolved Problems Electronic Signature(s) Signed: 05/11/2018 1:18:07 PM By: Worthy Keeler PA-C Entered By: Worthy Keeler on 05/10/2018 09:29:36 Hickle, Huntley Dec ([id number]) -------------------------------------------------------------------------------- Progress Note Details Patient Name: [name] Date of Service: 05/10/2018 9:00 AM Medical Record Number: [id number] Patient Account Number: [id number] Date of Birth/Sex: 1951/06/24 (67 y.o.  M) Treating RN: Montey Hora Primary Care Provider: BESS, KATY Other Clinician: Referring Provider: BESS, KATY Treating Provider/Extender: Melburn Hake, Tyronda Vizcarrondo Weeks in Treatment: 3 Subjective Chief Complaint Information obtained from Patient Left lower leg and left great toe ulcer History of Present Illness (HPI) The following HPI elements were documented for the patient's wound: Associated Signs and Symptoms: Patient has a history of chronic venous insufficiency, diabetes mellitus type II, its reform fraction, hypertension 04/19/18 on evaluation today patient presents initially concerning an ulcer of his left great toe and left anterior shin. He states that the shin was definitely a trauma injury where he struck this on a metal pole injuring leg. In regard to the distal left great toe he's really not sure what happened here although he does have a significant callous noted as well. He does have a pacemaker. Fortunately his ABI was 0.98 on the right and 0.83 on the left this appears to be doing fairly well. Overall I'm pleased with that. Nonetheless he states that this really has not seem to be improving. He has been tolerating the dressing changes without complication. The left anterior leg ulcer began on 04/05/18 according to what he tells [place] today  although one note I did have for review said it was April 5. I'm inclined to believe it was the fifth she did have an office visit on 04/01/18 with his family nurse practitioner Faith Rogue. Nonetheless either way it's been going on this month for several weeks. The left great toe has actually been present since January 1 he does not know how this occurred. Fortunately has no pain at the site although he does have pain on the left anterior shin. His hemoglobin A1c is 8.0 in March 2019. 04/26/18 on evaluation today patient presents for follow-up concerning his life into your [place] also as well as his left great toe also. Fortunately the shin actually appears to be doing better on evaluation today in my opinion I'm not seeing as much in the way of fluffernutter on the surface although it does still require debridement today this is improved. Size wise it's really not much different. With that being said the total ulcer actually appears to be a little bit more macerated today I'm not sure why they state that several days ago when this was changed that was not the case. Fortunately is not having severe pain although both areas do hurt the shin is worse. 05/03/18 on evaluation today patient appears to be doing better in regard to his left first toe and left shin ulcers. He is been tolerating the dressing changes without complication and the good news is this seems to be showing signs of getting better. Overall I'm very pleased with the progress that has been made over the past week. Patient is still having pain especially in regard to the left shin. 05/10/18 on evaluation today patient appears to be doing rather well in regard to his two ulcers. The toe ulcer appears to be smaller he does have some undermining however in this had to be cleaned out just a little bit. With that being said overall I feel like this is showing signs of improvement. The left anterior shin ulcer also showing signs of  improvement at this time. There does not appear to be any evidence of infection which is good news. Patient History Information obtained from Patient. Family History Cancer - Father, Diabetes - Father, Heart Disease - Mother,Father, Hypertension - Mother, Kidney Disease - Child, No family history of  Lung Disease, Seizures, Stroke, Thyroid Problems, Tuberculosis. Social History TAJAE, RYBICKI ([id number]) Current every day smoker - 1 - 1/2 pack daily, Marital Status - Married, Alcohol Use - Rarely, Drug Use - No History, Caffeine Use - Daily - coffee. Review of Systems (ROS) Constitutional Symptoms (General Health) Denies complaints or symptoms of Fever, Chills. Respiratory The patient has no complaints or symptoms. Cardiovascular The patient has no complaints or symptoms. Psychiatric The patient has no complaints or symptoms. Objective Constitutional Well-nourished and well-hydrated in no acute distress. Vitals Time Taken: 9:03 AM, Height: 69 in, Weight: 238 lbs, BMI: 35.1, Temperature: 97.9 F, Pulse: 70 bpm, Respiratory Rate: 18 breaths/min, Blood Pressure: 118/68 mmHg. Respiratory normal breathing without difficulty. Psychiatric this patient is able to make decisions and demonstrates good insight into disease process. Alert and Oriented x 3. pleasant and cooperative. General Notes: Currently patient's wounds both did have some Slough noted I was not however able to get a curette into the toe ulcer to clear this out due to the size of the wound at this point. I did mechanically debride this with saline and gauze along with the backend of a Q-tip today. He tolerated this without any significant pain. The left anterior shin however was sharply debrided with good result post debridement both appear to be doing better. Integumentary (Hair, Skin) Wound #1 status is Open. Original cause of wound was Trauma. The wound is located on the Left,Midline,Anterior Lower Leg. The wound  measures 2cm length x 1.8cm width x 0.1cm depth; 2.827cm^2 area and 0.283cm^3 volume. There is Fat Layer (Subcutaneous Tissue) Exposed exposed. There is no tunneling or undermining noted. There is a large amount of serous drainage noted. The wound margin is distinct with the outline attached to the wound base. There is medium (34-66%) red, hyper - granulation within the wound bed. There is a medium (34-66%) amount of necrotic tissue within the wound bed including Adherent Slough. The periwound skin appearance exhibited: Hemosiderin Staining. The periwound skin appearance did not exhibit: Callus, Crepitus, Excoriation, Induration, Rash, Scarring, Dry/Scaly, Maceration, Atrophie Blanche, Cyanosis, Ecchymosis, Mottled, Pallor, Rubor, Erythema. Periwound temperature was noted as No Abnormality. Wound #2 status is Open. Original cause of wound was Trauma. The wound is located on the Left Toe Great. The wound measures 0.2cm length x 0.2cm width x 0.2cm depth; 0.031cm^2 area and 0.006cm^3 volume. There is no tunneling or undermining noted. There is a none present amount of drainage noted. The wound margin is flat and intact. There is large (67-100%) pink granulation within the wound bed. There is no necrotic tissue within the wound bed. The periwound skin appearance exhibited: Callus. The periwound skin appearance did not exhibit: Crepitus, Excoriation, Induration, Rash, Scarring, Dry/Scaly, Maceration, Atrophie Blanche, Cyanosis, Ecchymosis, Hemosiderin Staining, Mottled, Pallor, Rubor, Kriesel, Moussa T. ([id number]) Erythema. Assessment Active Problems ICD-10 I87.2 - Venous insufficiency (chronic) (peripheral) Z16.967E - Laceration without foreign body, left lower leg, initial encounter L97.822 - Non-pressure chronic ulcer of other part of left lower leg with fat layer exposed L97.521 - Non-pressure chronic ulcer of other part of left foot limited to breakdown of skin E11.621 - Type 2 diabetes  mellitus with foot ulcer I63.9 - Cerebral infarction, unspecified I10 - Essential (primary) hypertension Procedures Wound #1 Pre-procedure diagnosis of Wound #1 is a Diabetic Wound/Ulcer of the Lower Extremity located on the Left,Midline,Anterior Lower Leg .Severity of Tissue Pre Debridement is: Fat layer exposed. There was a Excisional Skin/Subcutaneous Tissue Debridement with a total area of 3.6 sq cm performed  by STONE III, Megen Madewell E., PA-C. With the following instrument(s): Curette to remove Viable and Non-Viable tissue/material. Material removed includes Subcutaneous Tissue, Slough, and Fibrin/Exudate after achieving pain control using Lidocaine 4% Topical Solution. No specimens were taken. A time out was conducted at 09:33, prior to the start of the procedure. A Minimum amount of bleeding was controlled with Pressure. The procedure was tolerated well with a pain level of 0 throughout and a pain level of 0 following the procedure. Patient s Level of Consciousness post procedure was recorded as Awake and Alert and post-procedure vitals were taken including Temperature: 97.9 F, Pulse: 70 bpm, Respiratory Rate: 16 breaths/min, Blood Pressure: (118)/(68) mmHg. Post Debridement Measurements: 2cm length x 1.8cm width x 0.2cm depth; 0.565cm^3 volume. Character of Wound/Ulcer Post Debridement is improved. Severity of Tissue Post Debridement is: Fat layer exposed. Post procedure Diagnosis Wound #1: Same as Pre-Procedure Plan Wound Cleansing: Wound #1 Left,Midline,Anterior Lower Leg: Cleanse wound with mild soap and water May shower with protection. Wound #2 Left Toe Great: Cleanse wound with mild soap and water May Shower, gently pat wound dry prior to applying new dressing. Anesthetic (add to Medication List): Wound #1 Left,Midline,Anterior Lower Leg: Topical Lidocaine 4% cream applied to wound bed prior to debridement (In Clinic Only). Wound #2 Left Toe Great: Topical Lidocaine 4% cream  applied to wound bed prior to debridement (In Clinic Only). OCTAVIA, MOTTOLA ([id number]) Primary Wound Dressing: Wound #1 Left,Midline,Anterior Lower Leg: Silver Collagen Wound #2 Left Toe Great: Silver Collagen Secondary Dressing: Wound #1 Left,Midline,Anterior Lower Leg: ABD pad Wound #2 Left Toe Great: Gauze and Kerlix/Conform - secure with coban Foam Dressing Change Frequency: Wound #1 Left,Midline,Anterior Lower Leg: Change dressing every week Wound #2 Left Toe Great: Change dressing every other day. Follow-up Appointments: Wound #1 Left,Midline,Anterior Lower Leg: Return Appointment in 2 weeks. Nurse Visit as needed Wound #2 Left Toe Great: Return Appointment in 2 weeks. Nurse Visit as needed Edema Control: Wound #1 Left,Midline,Anterior Lower Leg: 3 Layer Compression System - Left Lower Extremity Patient to wear own compression stockings - right lower leg Elevate legs to the level of the heart and pump ankles as often as possible Additional Orders / Instructions: Wound #1 Left,Midline,Anterior Lower Leg: Stop Smoking Vitamin A; Vitamin C, Zinc - Please add a multivitamin with 100% of these Increase protein intake. Activity as tolerated Wound #2 Left Toe Great: Stop Smoking Vitamin A; Vitamin C, Zinc - Please add a multivitamin with 100% of these Increase protein intake. Activity as tolerated I'm gonna recommend at this point in time that we go ahead and continue with the Current wound care measures since he seems to be doing so well. Patient is in agreement this plan. We will subsequently see were things stand in one weeks time when we see him for reevaluation. We are gonna reorganize when he comes into the clinic however Friday mornings are not the best for him due [place]. Therefore we will see about switching him to Tuesdays and next week we will do a nurse visit. He's in agreement this plan. Please  see above for specific wound care orders. We will see patient for re-evaluation in 1 week(s) here in the clinic. If anything worsens or changes patient will contact our office for additional recommendations. Electronic Signature(s) Signed: 05/11/2018 1:18:07 PM By: Worthy Keeler PA-C Entered By: Worthy Keeler on 05/10/2018 09:42:00 Okuda, Huntley Dec ([id number]) -------------------------------------------------------------------------------- ROS/PFSH  Details Patient Name: SHANTA, DORVIL. Date of Service: 05/10/2018 9:00 AM Medical Record Number: [id number] Patient Account Number: [id number] Date of Birth/Sex: 03-10-1951 (67 y.o. M) Treating RN: Montey Hora Primary Care Provider: BESS, KATY Other Clinician: Referring Provider: BESS, KATY Treating Provider/Extender: STONE III, Julia Alkhatib Weeks in Treatment: 3 Information Obtained From Patient Wound History Do you currently have one or more open woundso Yes How many open wounds do you currently haveo 2 Approximately how long have you had your woundso 2 weeks How have you been treating your wound(s) until nowo yes Doctor Has your wound(s) ever healed and then re-openedo No Have you had any lab work done in the past montho No Have you tested positive for an antibiotic resistant organism (MRSA, VRE)o No Have you tested positive for osteomyelitis (bone infection)o No Have you had any tests for circulation on your legso No Have you had other problems associated with your woundso Infection, Swelling Constitutional Symptoms (General Health) Complaints and Symptoms: Negative for: Fever; Chills Eyes Medical History: Negative for: Cataracts; Glaucoma; Optic Neuritis Ear/Nose/Mouth/Throat Medical History: Negative for: Chronic sinus problems/congestion Hematologic/Lymphatic Medical History: Positive for: Lymphedema Negative for: Anemia; Hemophilia; Human Immunodeficiency Virus; Sickle Cell Disease Respiratory Complaints and Symptoms: No  Complaints or Symptoms Medical History: Negative for: Aspiration; Asthma; Chronic Obstructive Pulmonary Disease (COPD); Pneumothorax; Sleep Apnea; Tuberculosis Cardiovascular Complaints and Symptoms: No Complaints or Symptoms Medical History: QUANAH, MAJKA ([id number]) Positive for: Arrhythmia - pacemaker; Coronary Artery Disease; Hypertension; Myocardial Infarction Negative for: Deep Vein Thrombosis; Hypotension; Peripheral Arterial Disease; Peripheral Venous Disease; Phlebitis; Vasculitis Gastrointestinal Medical History: Negative for: Cirrhosis ; Colitis; Crohnos; Hepatitis A; Hepatitis B; Hepatitis C Endocrine Medical History: Positive for: Type II Diabetes Negative for: Type I Diabetes Time with diabetes: 5 years Treated with: Oral agents Blood sugar tested every day: Yes Tested : daily AM Blood sugar testing results: Breakfast: 174 Genitourinary Medical History: Negative for: End Stage Renal Disease Immunological Medical History: Negative for: Lupus Erythematosus; Raynaudos; Scleroderma Integumentary (Skin) Medical History: Negative for: History of Burn; History of pressure wounds Musculoskeletal Medical History: Negative for: Gout; Rheumatoid Arthritis; Osteoarthritis; Osteomyelitis Neurologic Medical History: Positive for: Neuropathy Negative for: Dementia; Quadriplegia; Paraplegia; Seizure Disorder Oncologic Medical History: Positive for: Received Chemotherapy; Received Radiation - Seeds prostate Psychiatric Complaints and Symptoms: No Complaints or Symptoms Medical History: Negative for: Anorexia/bulimia; Confinement Anxiety [place]ella, MIKAIL GOOSTREE ([id number]) Immunizations Pneumococcal Vaccine: Received Pneumococcal Vaccination: Yes Tetanus Vaccine: Last tetanus shot: 12/25/2017 Implantable Devices Family and Social History Cancer: Yes - Father; Diabetes: Yes - Father; Heart Disease: Yes - Mother,Father; Hypertension: Yes - Mother; Kidney Disease: Yes -  Child; Lung Disease: No; Seizures: No; Stroke: No; Thyroid Problems: No; Tuberculosis: No; Current every day smoker - 1 - 1/2 pack daily; Marital Status - Married; Alcohol Use: Rarely; Drug Use: No History; Caffeine Use: Daily - coffee; Financial Concerns: No; Food, Clothing or Shelter Needs: No; Support System Lacking: No; Transportation Concerns: No Physician Affirmation I have reviewed and agree with the above information. Electronic Signature(s) Signed: 05/10/2018 4:56:04 PM By: Montey Hora Signed: 05/11/2018 1:18:07 PM By: Worthy Keeler PA-C Entered By: Worthy Keeler on 05/10/2018 09:41:04 Desai, Huntley Dec ([id number]) -------------------------------------------------------------------------------- SuperBill Details Patient Name: [name] Date of Service: 05/10/2018 Medical Record Number: [id number] Patient Account Number: [id number] Date of Birth/Sex: April 12, 1951 (67 y.o. M) Treating RN: Montey Hora Primary Care Provider: BESS, Valetta Fuller Other Clinician: Referring Provider: BESS, KATY Treating Provider/Extender: STONE III, Eulamae Greenstein Weeks in Treatment: 3 Diagnosis Coding ICD-10 Codes Code Description  I87.2 Venous insufficiency (chronic) (peripheral) S81.812A Laceration without foreign body, left lower leg, initial encounter L97.822 Non-pressure chronic ulcer of other part of left lower leg with fat layer exposed L97.521 Non-pressure chronic ulcer of other part of left foot limited to breakdown of skin E11.621 Type 2 diabetes mellitus with foot ulcer I63.9 Cerebral infarction, unspecified I10 Essential (primary) hypertension Facility Procedures CPT4 Code Description: 18335825 11042 - DEB SUBQ TISSUE 20 SQ CM/< ICD-10 Diagnosis Description L97.822 Non-pressure chronic ulcer of other part of left lower leg wit Modifier: h fat layer expos Quantity: 1 ed Physician Procedures CPT4 Code Description: 1898421 11042 - WC PHYS SUBQ TISS 20 SQ CM ICD-10 Diagnosis Description L97.822  Non-pressure chronic ulcer of other part of left lower leg wit Modifier: h fat layer expos Quantity: 1 ed Electronic Signature(s) Signed: 05/11/2018 1:18:07 PM By: Worthy Keeler PA-C Entered By: Worthy Keeler on 05/10/2018 09:42:48

## 2018-05-12 NOTE — Progress Notes (Signed)
Marcus, [name] ([id number]) Visit Report for 05/10/2018 Arrival Information Details Patient Name: [name], [name]. Date of Service: 05/10/2018 9:00 AM Medical Record Number: [id number] Patient Account Number: [id number] Date of Birth/Sex: 02/11/1951 (67 y.o. M) Treating RN: Roger Shelter Primary Care Daiel Strohecker: BESS, KATY Other Clinician: Referring Anniyah Mood: BESS, KATY Treating Naeema Patlan/Extender: STONE III, HOYT Weeks in Treatment: 3 Visit Information History Since Last Visit All ordered tests and consults were completed: No Patient Arrived: Ambulatory Added or deleted any medications: No Arrival Time: 09:01 Any new allergies or adverse reactions: No Accompanied By: self Had a fall or experienced change in No Transfer Assistance: None activities of daily living that may affect Patient Identification Verified: Yes risk of falls: Secondary Verification Process Completed: Yes Signs or symptoms of abuse/neglect since last visito No Patient Has Alerts: Yes Hospitalized since last visit: No Patient Alerts: Type II Diabetic Implantable device outside of the clinic excluding No cellular tissue based products placed in the center since last visit: Pain Present Now: No Electronic Signature(s) Signed: 05/10/2018 4:25:58 PM By: Roger Shelter Entered By: Roger Shelter on 05/10/2018 09:03:06 Olejnik, Huntley [name] ([id number]) -------------------------------------------------------------------------------- Encounter Discharge Information Details Patient Name: Marcus Loll T. Date of Service: 05/10/2018 9:00 AM Medical Record Number: [id number] Patient Account Number: [id number] Date of Birth/Sex: Sep 11, 1951 (67 y.o. M) Treating RN: Cornell Barman Primary Care Sharia Averitt: BESS, KATY Other Clinician: Referring Kelechi Astarita: BESS, KATY Treating Salvator Seppala/Extender: Melburn Hake, HOYT Weeks in Treatment: 3 Encounter Discharge Information Items Discharge Condition: Stable Ambulatory Status:  Ambulatory Discharge Destination: Home Transportation: Private Auto Accompanied By: self Schedule Follow-up Appointment: No Clinical Summary of Care: Patient Declined Electronic Signature(s) Signed: 05/10/2018 5:09:47 PM By: Gretta Cool, BSN, RN, CWS, Kim RN, BSN Entered By: Gretta Cool, BSN, RN, CWS, Kim on 05/10/2018 09:53:16 Salvia, Huntley [name] ([id number]) -------------------------------------------------------------------------------- Lower Extremity Assessment Details Patient Name: Marcus Loll T. Date of Service: 05/10/2018 9:00 AM Medical Record Number: [id number] Patient Account Number: [id number] Date of Birth/Sex: 10/28/1951 (67 y.o. M) Treating RN: Roger Shelter Primary Care Jersi Mcmaster: BESS, KATY Other Clinician: Referring Sherylann Vangorden: BESS, KATY Treating Zaveon Gillen/Extender: STONE III, HOYT Weeks in Treatment: 3 Edema Assessment Assessed: [Left: No] [Right: No] [Left: Edema] [Right: :] Calf Left: Right: Point of Measurement: 34 cm From Medial Instep 38.5 cm cm Ankle Left: Right: Point of Measurement: 11 cm From Medial Instep 24.5 cm cm Vascular Assessment Claudication: Claudication Assessment [Left:None] Pulses: Dorsalis Pedis Palpable: [Left:Yes] Posterior Tibial Extremity colors, hair growth, and conditions: Extremity Color: [Left:Hyperpigmented] Hair Growth on Extremity: [Left:No] Temperature of Extremity: [Left:Warm] Capillary Refill: [Left:< 3 seconds] Toe Nail Assessment Left: Right: Thick: Yes Discolored: Yes Deformed: Yes Improper Length and Hygiene: Yes Electronic Signature(s) Signed: 05/10/2018 4:25:58 PM By: Roger Shelter Entered By: Roger Shelter on 05/10/2018 09:17:50 [name] ([id number]) -------------------------------------------------------------------------------- Multi Wound Chart Details Patient Name: Marcus Loll T. Date of Service: 05/10/2018 9:00 AM Medical Record Number: [id number] Patient Account Number: [id number] Date of  Birth/Sex: 12-01-1951 (67 y.o. M) Treating RN: Montey Hora Primary Care Marshun Duva: BESS, KATY Other Clinician: Referring Trevante Tennell: BESS, KATY Treating Ashtin Rosner/Extender: STONE III, HOYT Weeks in Treatment: 3 Vital Signs Height(in): 69 Pulse(bpm): 70 Weight(lbs): 238 Blood Pressure(mmHg): 118/68 Body Mass Index(BMI): 35 Temperature(F): 97.9 Respiratory Rate 18 (breaths/min): Photos: [1:No Photos] [2:No Photos] [N/A:N/A] Wound Location: [1:Left Lower Leg - Midline, Anterior] [2:Left Toe Great] [N/A:N/A] Wounding Event: [1:Trauma] [2:Trauma] [N/A:N/A] Primary Etiology: [1:Diabetic Wound/Ulcer of the Lower Extremity] [2:Diabetic Wound/Ulcer of the Lower Extremity] [N/A:N/A] Secondary Etiology: [1:Trauma, Other] [2:Trauma, Other] [N/A:N/A] Comorbid History: [1:Lymphedema, Arrhythmia, Coronary Artery  Disease, Hypertension, Myocardial Infarction, Type II Diabetes, Neuropathy, Received Chemotherapy, Received Radiation] [2:Lymphedema, Arrhythmia, Coronary Artery Disease, Hypertension,  Myocardial Infarction, Type II Diabetes, Neuropathy, Received Chemotherapy, Received Radiation] [N/A:N/A] Date Acquired: [1:04/05/2018] [2:12/25/2017] [N/A:N/A] Weeks of Treatment: [1:3] [2:3] [N/A:N/A] Wound Status: [1:Open] [2:Open] [N/A:N/A] Pending Amputation on [1:No] [2:Yes] [N/A:N/A] Presentation: Measurements L x W x D [1:2x1.8x0.1] [2:0.2x0.2x0.2] [N/A:N/A] (cm) Area (cm) : [1:2.827] [2:0.031] [N/A:N/A] Volume (cm) : [1:0.283] [2:0.006] [N/A:N/A] % Reduction in Area: [1:5.30%] [2:34.00%] [N/A:N/A] % Reduction in Volume: [1:5.00%] [2:-20.00%] [N/A:N/A] Classification: [1:Grade 1] [2:Grade 1] [N/A:N/A] Exudate Amount: [1:Large] [2:None Present] [N/A:N/A] Exudate Type: [1:Serous] [2:N/A] [N/A:N/A] Exudate Color: [1:amber] [2:N/A] [N/A:N/A] Wound Margin: [1:Distinct, outline attached] [2:Flat and Intact] [N/A:N/A] Granulation Amount: [1:Medium (34-66%)] [2:Large (67-100%)] [N/A:N/A] Granulation  Quality: [1:Red, Hyper-granulation] [2:Pink] [N/A:N/A] Necrotic Amount: [1:Medium (34-66%)] [2:None Present (0%)] [N/A:N/A] Exposed Structures: [1:Fat Layer (Subcutaneous Tissue) Exposed: Yes] [2:Fascia: No Fat Layer (Subcutaneous Tissue) Exposed: No Tendon: No] [N/A:N/A] Muscle: No Joint: No Bone: No Epithelialization: None None N/A Periwound Skin Texture: Excoriation: No Callus: Yes N/A Induration: No Excoriation: No Callus: No Induration: No Crepitus: No Crepitus: No Rash: No Rash: No Scarring: No Scarring: No Periwound Skin Moisture: Maceration: No Maceration: No N/A Dry/Scaly: No Dry/Scaly: No Periwound Skin Color: Hemosiderin Staining: Yes Atrophie Blanche: No N/A Atrophie Blanche: No Cyanosis: No Cyanosis: No Ecchymosis: No Ecchymosis: No Erythema: No Erythema: No Hemosiderin Staining: No Mottled: No Mottled: No Pallor: No Pallor: No Rubor: No Rubor: No Temperature: No Abnormality N/A N/A Tenderness on Palpation: No No N/A Wound Preparation: Ulcer Cleansing: Ulcer Cleansing: N/A Rinsed/Irrigated with Saline Rinsed/Irrigated with Saline Topical Anesthetic Applied: Topical Anesthetic Applied: Other: lidocaine 4% Other: lidocaine 4% Treatment Notes Electronic Signature(s) Signed: 05/10/2018 4:56:04 PM By: Montey Hora Entered By: Montey Hora on 05/10/2018 09:32:23 Ewan, Huntley [name] ([id number]) -------------------------------------------------------------------------------- Multi-Disciplinary Care Plan Details Patient Name: [name] Date of Service: 05/10/2018 9:00 AM Medical Record Number: [id number] Patient Account Number: [id number] Date of Birth/Sex: [birth year] (67 y.o. M) Treating RN: Montey Hora Primary Care Arlow Spiers: BESS, KATY Other Clinician: Referring Novella Abraha: BESS, KATY Treating Rahm Minix/Extender: STONE III, HOYT Weeks in Treatment: 3 Active Inactive ` Abuse / Safety / Falls / Self Care Management Nursing  Diagnoses: Potential for falls Goals: Patient will remain injury free related to falls Date Initiated: 04/19/2018 Target Resolution Date: 05/04/2018 Goal Status: Active Interventions: Assess fall risk on admission and as needed Notes: ` Nutrition Nursing Diagnoses: Impaired glucose control: actual or potential Goals: Patient/caregiver agrees to and verbalizes understanding of need to use nutritional supplements and/or vitamins as prescribed Date Initiated: 04/19/2018 Target Resolution Date: 06/07/2018 Goal Status: Active Patient/caregiver verbalizes understanding of need to maintain therapeutic glucose control per primary care physician Date Initiated: 04/19/2018 Target Resolution Date: 05/31/2018 Goal Status: Active Interventions: Assess HgA1c results as ordered upon admission and as needed Assess patient nutrition upon admission and as needed per policy Notes: ` Orientation to the Wound Care Program Nursing Diagnoses: Knowledge deficit related to the wound healing center program Goals: Patient/caregiver will verbalize understanding of the Valinda CY, BRESEE ([id number]) Date Initiated: 04/19/2018 Target Resolution Date: 06/29/2018 Goal Status: Active Interventions: Provide education on orientation to the wound center Notes: ` Wound/Skin Impairment Nursing Diagnoses: Impaired tissue integrity Goals: Ulcer/skin breakdown will heal within 14 weeks Date Initiated: 04/19/2018 Target Resolution Date: 06/29/2018 Goal Status: Active Interventions: Assess patient/caregiver ability to obtain necessary supplies Assess patient/caregiver ability to perform ulcer/skin care regimen upon admission and as needed Assess ulceration(s) every visit Notes: Electronic  Signature(s) Signed: 05/10/2018 4:56:04 PM By: Montey Hora Entered By: Montey Hora on 05/10/2018 09:32:13 Barb, Huntley [name]  ([id number]) -------------------------------------------------------------------------------- Pain Assessment Details Patient Name: Marcus Loll T. Date of Service: 05/10/2018 9:00 AM Medical Record Number: [id number] Patient Account Number: [id number] Date of Birth/Sex: May 04, 1951 (67 y.o. M) Treating RN: Roger Shelter Primary Care Jolisa Intriago: BESS, Valetta Fuller Other Clinician: Referring Ivi Griffith: BESS, KATY Treating Detroit Frieden/Extender: STONE III, HOYT Weeks in Treatment: 3 Active Problems Location of Pain Severity and Description of Pain Patient Has Paino No Site Locations Pain Management and Medication Current Pain Management: Electronic Signature(s) Signed: 05/10/2018 4:25:58 PM By: Roger Shelter Entered By: Roger Shelter on 05/10/2018 09:03:12 Nadeem, Huntley [name] ([id number]) -------------------------------------------------------------------------------- Patient/Caregiver Education Details Patient Name: [name] Date of Service: 05/10/2018 9:00 AM Medical Record Number: [id number] Patient Account Number: [id number] Date of Birth/Gender: Apr 23, 1951 (67 y.o. M) Treating RN: Cornell Barman Primary Care Physician: BESS, Valetta Fuller Other Clinician: Referring Physician: Lillard Anes Treating Physician/Extender: Sharalyn Ink in Treatment: 3 Education Assessment Education Provided To: Patient Education Topics Provided Welcome [facility]: Handouts: [facility] Methods: Demonstration Responses: State content correctly Electronic Signature(s) Signed: 05/10/2018 5:09:47 PM By: Gretta Cool, BSN, RN, CWS, Kim RN, BSN Entered By: Gretta Cool, BSN, RN, CWS, Kim on 05/10/2018 09:53:36 Baller, Huntley [name] ([id number]) -------------------------------------------------------------------------------- Wound Assessment Details Patient Name: [name] Date of Service: 05/10/2018 9:00 AM Medical Record Number: [id number] Patient Account Number: [id number] Date of  Birth/Sex: 03/29/1951 (67 y.o. M) Treating RN: Roger Shelter Primary Care Priest Lockridge: BESS, KATY Other Clinician: Referring Marcille Barman: BESS, KATY Treating Nel Stoneking/Extender: STONE III, HOYT Weeks in Treatment: 3 Wound Status Wound Number: 1 Primary Diabetic Wound/Ulcer of the Lower Extremity Etiology: Wound Location: Left Lower Leg - Midline, Anterior Secondary Trauma, Other Wounding Event: Trauma Etiology: Date Acquired: 04/05/2018 Wound Open Weeks Of Treatment: 3 Status: Clustered Wound: No Comorbid Lymphedema, Arrhythmia, Coronary Artery History: Disease, Hypertension, Myocardial Infarction, Type II Diabetes, Neuropathy, Received Chemotherapy, Received Radiation Photos Photo Uploaded By: Roger Shelter on 05/10/2018 16:36:59 Wound Measurements Length: (cm) 2 Width: (cm) 1.8 Depth: (cm) 0.1 Area: (cm) 2.827 Volume: (cm) 0.283 % Reduction in Area: 5.3% % Reduction in Volume: 5% Epithelialization: None Tunneling: No Undermining: No Wound Description Classification: Grade 1 Wound Margin: Distinct, outline attached Exudate Amount: Large Exudate Type: Serous Exudate Color: amber Foul Odor After Cleansing: No Slough/Fibrino Yes Wound Bed Granulation Amount: Medium (34-66%) Exposed Structure Granulation Quality: Red, Hyper-granulation Fat Layer (Subcutaneous Tissue) Exposed: Yes Necrotic Amount: Medium (34-66%) Necrotic Quality: Adherent Slough Periwound Skin Texture Montellano, Huntley [name] ([id number]) Texture Color No Abnormalities Noted: No No Abnormalities Noted: No Callus: No Atrophie Blanche: No Crepitus: No Cyanosis: No Excoriation: No Ecchymosis: No Induration: No Erythema: No Rash: No Hemosiderin Staining: Yes Scarring: No Mottled: No Pallor: No Moisture Rubor: No No Abnormalities Noted: No Dry / Scaly: No Temperature / Pain Maceration: No Temperature: No Abnormality Wound Preparation Ulcer Cleansing: Rinsed/Irrigated with Saline Topical  Anesthetic Applied: Other: lidocaine 4%, Treatment Notes Wound #1 (Left, Midline, Anterior Lower Leg) 1. Cleansed with: Clean wound with Normal Saline Cleanse wound with antibacterial soap and water 2. Anesthetic Topical Lidocaine 4% cream to wound bed prior to debridement 4. Dressing Applied: Prisma Ag 5. Secondary Dressing Applied ABD Pad Dry Gauze 7. Secured with 3 Layer Compression System - Left Lower Extremity Other (specify in notes) Notes Unna to secure at top Electronic Signature(s) Signed: 05/10/2018 4:25:58 PM By: Roger Shelter Entered By: Roger Shelter on 05/10/2018 09:14:40 Rabadan,  Huntley [name] ([id number]) -------------------------------------------------------------------------------- Wound Assessment Details Patient Name: CHANAN, DETWILER. Date of Service: 05/10/2018 9:00 AM Medical Record Number: [id number] Patient Account Number: [id number] Date of Birth/Sex: 31-Jul-1951 (67 y.o. M) Treating RN: Roger Shelter Primary Care Jakeem Grape: BESS, KATY Other Clinician: Referring Henrique Parekh: BESS, KATY Treating Conner Neiss/Extender: STONE III, HOYT Weeks in Treatment: 3 Wound Status Wound Number: 2 Primary Diabetic Wound/Ulcer of the Lower Extremity Etiology: Wound Location: Left Toe Great Secondary Trauma, Other Wounding Event: Trauma Etiology: Date Acquired: 12/25/2017 Wound Open Weeks Of Treatment: 3 Status: Clustered Wound: No Comorbid Lymphedema, Arrhythmia, Coronary Artery Pending Amputation On Presentation History: Disease, Hypertension, Myocardial Infarction, Type II Diabetes, Neuropathy, Received Chemotherapy, Received Radiation Photos Photo Uploaded By: Roger Shelter on 05/10/2018 16:37:00 Wound Measurements Length: (cm) 0.2 Width: (cm) 0.2 Depth: (cm) 0.2 Area: (cm) 0.031 Volume: (cm) 0.006 % Reduction in Area: 34% % Reduction in Volume: -20% Epithelialization: None Tunneling: No Undermining: No Wound Description Classification: Grade  1 Wound Margin: Flat and Intact Exudate Amount: None Present Foul Odor After Cleansing: No Slough/Fibrino No Wound Bed Granulation Amount: Large (67-100%) Exposed Structure Granulation Quality: Pink Fascia Exposed: No Necrotic Amount: None Present (0%) Fat Layer (Subcutaneous Tissue) Exposed: No Tendon Exposed: No Muscle Exposed: No Joint Exposed: No Bone Exposed: No Boeding, Xylan T. ([id number]) Periwound Skin Texture Texture Color No Abnormalities Noted: No No Abnormalities Noted: No Callus: Yes Atrophie Blanche: No Crepitus: No Cyanosis: No Excoriation: No Ecchymosis: No Induration: No Erythema: No Rash: No Hemosiderin Staining: No Scarring: No Mottled: No Pallor: No Moisture Rubor: No No Abnormalities Noted: No Dry / Scaly: No Maceration: No Wound Preparation Ulcer Cleansing: Rinsed/Irrigated with Saline Topical Anesthetic Applied: Other: lidocaine 4%, Treatment Notes Wound #2 (Left Toe Great) 1. Cleansed with: Clean wound with Normal Saline Cleanse wound with antibacterial soap and water 2. Anesthetic Topical Lidocaine 4% cream to wound bed prior to debridement 4. Dressing Applied: Prisma Ag 5. Secondary Dressing Applied ABD Pad Dry Gauze 7. Secured with 3 Layer Compression System - Left Lower Extremity Other (specify in notes) Notes Unna to secure at top Electronic Signature(s) Signed: 05/10/2018 4:25:58 PM By: Roger Shelter Entered By: Roger Shelter on 05/10/2018 09:16:41 Arntz, Huntley [name] ([id number]) -------------------------------------------------------------------------------- Vitals Details Patient Name: [name] Date of Service: 05/10/2018 9:00 AM Medical Record Number: [id number] Patient Account Number: [id number] Date of Birth/Sex: [birth year] (67 y.o. M) Treating RN: Roger Shelter Primary Care Lashannon Bresnan: BESS, KATY Other Clinician: Referring Armoni Kludt: BESS, KATY Treating Chane Magner/Extender: STONE III, HOYT Weeks in  Treatment: 3 Vital Signs Time Taken: 09:03 Temperature (F): 97.9 Height (in): 69 Pulse (bpm): 70 Weight (lbs): 238 Respiratory Rate (breaths/min): 18 Body Mass Index (BMI): 35.1 Blood Pressure (mmHg): 118/68 Reference Range: 80 - 120 mg / dl Electronic Signature(s) Signed: 05/10/2018 4:25:58 PM By: Roger Shelter Entered By: Roger Shelter on 05/10/2018 09:03:32

## 2018-05-13 LAB — CUP PACEART REMOTE DEVICE CHECK
Date Time Interrogation Session: 20190421000813
Implantable Pulse Generator Implant Date: 20170821

## 2018-05-15 DIAGNOSIS — E11622 Type 2 diabetes mellitus with other skin ulcer: Secondary | ICD-10-CM | POA: Diagnosis not present

## 2018-05-16 ENCOUNTER — Ambulatory Visit (INDEPENDENT_AMBULATORY_CARE_PROVIDER_SITE_OTHER): Payer: PPO | Admitting: *Deleted

## 2018-05-16 DIAGNOSIS — I63411 Cerebral infarction due to embolism of right middle cerebral artery: Secondary | ICD-10-CM | POA: Diagnosis not present

## 2018-05-16 NOTE — Progress Notes (Signed)
SAYGE, SALVATO ([id number]) Visit Report for 05/15/2018 Arrival Information Details Patient Name: [name], [name]. Date of Service: 05/15/2018 2:45 PM Medical Record Number: [id number] Patient Account Number: [id number] Date of Birth/Sex: [birth year] (67 y.o. M) Treating RN: Roger Shelter Primary Care Medardo Hassing: BESS, KATY Other Clinician: Referring Javonna Balli: BESS, KATY Treating Nadezhda Pollitt/Extender: Tito Dine in Treatment: 3 Visit Information History Since Last Visit All ordered tests and consults were completed: No Patient Arrived: Ambulatory Added or deleted any medications: No Arrival Time: 15:05 Any new allergies or adverse reactions: No Accompanied By: self Had a fall or experienced change in No Transfer Assistance: None activities of daily living that may affect Patient Identification Verified: Yes risk of falls: Secondary Verification Process Completed: Yes Signs or symptoms of abuse/neglect since last visito No Patient Has Alerts: Yes Hospitalized since last visit: No Patient Alerts: Type II Diabetic Implantable device outside of the clinic excluding No cellular tissue based products placed in the center since last visit: Pain Present Now: Yes Electronic Signature(s) Signed: 05/15/2018 4:05:18 PM By: Roger Shelter Entered By: Roger Shelter on 05/15/2018 15:06:01 Leone, Huntley Dec ([id number]) -------------------------------------------------------------------------------- Compression Therapy Details Patient Name: Marcus Loll T. Date of Service: 05/15/2018 2:45 PM Medical Record Number: [id number] Patient Account Number: [id number] Date of Birth/Sex: November 24, 1951 (67 y.o. M) Treating RN: Roger Shelter Primary Care Charnel Giles: BESS, KATY Other Clinician: Referring Grahm Etsitty: BESS, KATY Treating Genae Strine/Extender: Ricard Dillon Weeks in Treatment: 3 Compression Therapy Performed for Wound Assessment: Wound #2 Left Toe Great Performed By:  Clinician Roger Shelter, RN Compression Type: Three Layer Pre Treatment ABI: 1 Electronic Signature(s) Signed: 05/15/2018 4:05:18 PM By: Roger Shelter Entered By: Roger Shelter on 05/15/2018 15:24:20 Vizcarrondo, Huntley Dec ([id number]) -------------------------------------------------------------------------------- Encounter Discharge Information Details Patient Name: Marcus Loll T. Date of Service: 05/15/2018 2:45 PM Medical Record Number: [id number] Patient Account Number: [id number] Date of Birth/Sex: 1951-11-17 (67 y.o. M) Treating RN: Roger Shelter Primary Care Owyn Raulston: BESS, KATY Other Clinician: Referring Timon Geissinger: BESS, KATY Treating Bilan Tedesco/Extender: Tito Dine in Treatment: 3 Encounter Discharge Information Items Discharge Condition: Stable Ambulatory Status: Ambulatory Discharge Destination: Home Transportation: Private Auto Schedule Follow-up Appointment: Yes Clinical Summary of Care: Electronic Signature(s) Signed: 05/15/2018 4:05:18 PM By: Roger Shelter Entered By: Roger Shelter on 05/15/2018 15:26:45 Hughett, Huntley Dec ([id number]) -------------------------------------------------------------------------------- Patient/Caregiver Education Details Patient Name: Marcus Lark. Date of Service: 05/15/2018 2:45 PM Medical Record Number: [id number] Patient Account Number: [id number] Date of Birth/Gender: May 05, 1951 (67 y.o. M) Treating RN: Roger Shelter Primary Care Physician: BESS, KATY Other Clinician: Referring Physician: Lillard Anes Treating Physician/Extender: Tito Dine in Treatment: 3 Education Assessment Education Provided To: Patient Education Topics Provided Wound/Skin Impairment: Handouts: Caring for Your Ulcer Methods: Explain/Verbal Responses: State content correctly Electronic Signature(s) Signed: 05/15/2018 4:05:18 PM By: Roger Shelter Entered By: Roger Shelter on 05/15/2018 15:26:34 Grau, Huntley Dec ([id number]) -------------------------------------------------------------------------------- Wound Assessment Details Patient Name: Marcus Loll T. Date of Service: 05/15/2018 2:45 PM Medical Record Number: [id number] Patient Account Number: [id number] Date of Birth/Sex: January 07, 1951 (67 y.o. M) Treating RN: Roger Shelter Primary Care Amar Sippel: BESS, KATY Other Clinician: Referring Aime Carreras: BESS, KATY Treating Virginia Curl/Extender: Ricard Dillon Weeks in Treatment: 3 Wound Status Wound Number: 1 Primary Diabetic Wound/Ulcer of the Lower Extremity Etiology: Wound Location: Left Lower Leg - Midline, Anterior Secondary Trauma, Other Wounding Event: Trauma Etiology: Date Acquired: 04/05/2018 Wound Open Weeks Of Treatment: 3 Status: Clustered Wound: No Comorbid Lymphedema, Arrhythmia, Coronary Artery History: Disease, Hypertension, Myocardial Infarction, Type II Diabetes, Neuropathy, Received Chemotherapy,  Received Radiation Photos Photo Uploaded By: Roger Shelter on 05/15/2018 16:33:23 Wound Measurements Length: (cm) 2 Width: (cm) 1.6 Depth: (cm) 0.1 Area: (cm) 2.513 Volume: (cm) 0.251 % Reduction in Area: 15.8% % Reduction in Volume: 15.8% Epithelialization: None Tunneling: No Undermining: No Wound Description Classification: Grade 1 Wound Margin: Distinct, outline attached Exudate Amount: Large Exudate Type: Serous Exudate Color: amber Foul Odor After Cleansing: No Slough/Fibrino Yes Wound Bed Granulation Amount: Medium (34-66%) Exposed Structure Granulation Quality: Red, Hyper-granulation Fat Layer (Subcutaneous Tissue) Exposed: Yes Necrotic Amount: Medium (34-66%) Necrotic Quality: Adherent Slough Periwound Skin Texture Mcpherson, Huntley Dec ([id number]) Texture Color No Abnormalities Noted: No No Abnormalities Noted: No Callus: No Atrophie Blanche: No Crepitus: No Cyanosis: No Excoriation: No Ecchymosis: No Induration: No Erythema: No Rash:  No Hemosiderin Staining: Yes Scarring: No Mottled: No Pallor: No Moisture Rubor: No No Abnormalities Noted: No Dry / Scaly: No Temperature / Pain Maceration: No Temperature: No Abnormality Wound Preparation Ulcer Cleansing: Rinsed/Irrigated with Saline Topical Anesthetic Applied: None Treatment Notes Wound #1 (Left, Midline, Anterior Lower Leg) 1. Cleansed with: Clean wound with Normal Saline 2. Anesthetic Topical Lidocaine 4% cream to wound bed prior to debridement 4. Dressing Applied: Prisma Ag 5. Secondary Dressing Applied ABD Pad 7. Secured with 3 Layer Compression System - Left Lower Extremity Notes Unna to secure at top Electronic Signature(s) Signed: 05/15/2018 4:05:18 PM By: Roger Shelter Entered By: Roger Shelter on 05/15/2018 15:08:11 Lyter, Huntley Dec ([id number]) -------------------------------------------------------------------------------- Wound Assessment Details Patient Name: Marcus Lark. Date of Service: 05/15/2018 2:45 PM Medical Record Number: [id number] Patient Account Number: [id number] Date of Birth/Sex: [birth year] (67 y.o. M) Treating RN: Roger Shelter Primary Care Sharita Bienaime: BESS, KATY Other Clinician: Referring Minor Iden: BESS, KATY Treating Riyah Bardon/Extender: Ricard Dillon Weeks in Treatment: 3 Wound Status Wound Number: 2 Primary Diabetic Wound/Ulcer of the Lower Extremity Etiology: Wound Location: Left Toe Great Secondary Trauma, Other Wounding Event: Trauma Etiology: Date Acquired: 12/25/2017 Wound Open Weeks Of Treatment: 3 Status: Clustered Wound: No Comorbid Lymphedema, Arrhythmia, Coronary Artery Pending Amputation On Presentation History: Disease, Hypertension, Myocardial Infarction, Type II Diabetes, Neuropathy, Received Chemotherapy, Received Radiation Photos Photo Uploaded By: Roger Shelter on 05/15/2018 16:33:44 Wound Measurements Length: (cm) 0.2 % Reduct Width: (cm) 0.2 % Reduct Depth: (cm) 0.2  Epitheli Area: (cm) 0.031 Tunneli Volume: (cm) 0.006 Undermi ion in Area: 34% ion in Volume: -20% alization: None ng: No ning: No Wound Description Classification: Grade 1 Wound Margin: Flat and Intact Exudate Amount: None Present Foul Odor After Cleansing: No Slough/Fibrino No Wound Bed Granulation Amount: Large (67-100%) Exposed Structure Granulation Quality: Pink Fascia Exposed: No Necrotic Amount: None Present (0%) Fat Layer (Subcutaneous Tissue) Exposed: No Tendon Exposed: No Muscle Exposed: No Joint Exposed: No Bone Exposed: No [name], Marcus T. ([id number]) Periwound Skin Texture Texture Color No Abnormalities Noted: No No Abnormalities Noted: No Callus: Yes Atrophie Blanche: No Crepitus: No Cyanosis: No Excoriation: No Ecchymosis: No Induration: No Erythema: No Rash: No Hemosiderin Staining: No Scarring: No Mottled: No Pallor: No Moisture Rubor: No No Abnormalities Noted: No Dry / Scaly: No Maceration: No Wound Preparation Ulcer Cleansing: Rinsed/Irrigated with Saline Topical Anesthetic Applied: None Treatment Notes Wound #2 (Left Toe Great) 1. Cleansed with: Clean wound with Normal Saline 2. Anesthetic Topical Lidocaine 4% cream to wound bed prior to debridement 4. Dressing Applied: Prisma Ag 5. Secondary Dressing Applied Dry Gauze Foam Notes Unna to secure at top Electronic Signature(s) Signed: 05/15/2018 4:05:18 PM By: Roger Shelter Entered By: Roger Shelter on 05/15/2018 15:08:54

## 2018-05-17 NOTE — Progress Notes (Signed)
Carelink Summary Report / Loop Recorder 

## 2018-05-21 ENCOUNTER — Other Ambulatory Visit: Payer: Self-pay | Admitting: Physician Assistant

## 2018-05-21 ENCOUNTER — Encounter: Payer: PPO | Admitting: Physician Assistant

## 2018-05-21 ENCOUNTER — Ambulatory Visit
Admission: RE | Admit: 2018-05-21 | Discharge: 2018-05-21 | Disposition: A | Discharge status: Home / Self Care | Payer: PPO | Source: Ambulatory Visit | Attending: Physician Assistant | Admitting: Physician Assistant

## 2018-05-21 ENCOUNTER — Other Ambulatory Visit: Payer: Self-pay | Admitting: Adult Health

## 2018-05-21 DIAGNOSIS — S81809A Unspecified open wound, unspecified lower leg, initial encounter: Secondary | ICD-10-CM | POA: Insufficient documentation

## 2018-05-21 DIAGNOSIS — M7732 Calcaneal spur, left foot: Secondary | ICD-10-CM | POA: Diagnosis not present

## 2018-05-21 DIAGNOSIS — L97522 Non-pressure chronic ulcer of other part of left foot with fat layer exposed: Secondary | ICD-10-CM | POA: Diagnosis not present

## 2018-05-21 DIAGNOSIS — E11622 Type 2 diabetes mellitus with other skin ulcer: Secondary | ICD-10-CM | POA: Diagnosis not present

## 2018-05-21 DIAGNOSIS — X58XXXA Exposure to other specified factors, initial encounter: Secondary | ICD-10-CM | POA: Insufficient documentation

## 2018-05-21 DIAGNOSIS — E11621 Type 2 diabetes mellitus with foot ulcer: Secondary | ICD-10-CM | POA: Diagnosis not present

## 2018-05-22 NOTE — Progress Notes (Signed)
JAIRON, RIPBERGER ([id number]) Visit Report for 05/03/2018 Arrival Information Details Patient Name: [name], [name]. Date of Service: 05/03/2018 8:45 AM Medical Record Number: [id number] Patient Account Number: [id number] Date of Birth/Sex: 05/21/1951 (67 y.o. M) Treating RN: Montey Hora Primary Care Aylen Rambert: BESS, KATY Other Clinician: Referring Jazel Nimmons: BESS, KATY Treating Aylssa Herrig/Extender: Melburn Hake, HOYT Weeks in Treatment: 2 Visit Information History Since Last Visit Added or deleted any medications: No Patient Arrived: Ambulatory Any new allergies or adverse reactions: No Arrival Time: 08:42 Had a fall or experienced change in No Accompanied By: self activities of daily living that may affect Transfer Assistance: None risk of falls: Patient Identification Verified: Yes Signs or symptoms of abuse/neglect since last visito No Secondary Verification Process Completed: Yes Hospitalized since last visit: No Patient Has Alerts: Yes Pain Present Now: No Patient Alerts: Type II Diabetic Electronic Signature(s) Signed: 05/22/2018 7:45:54 AM By: Harold Barban Entered By: Harold Barban on 05/03/2018 08:43:58 Rosko, Huntley Dec ([id number]) -------------------------------------------------------------------------------- Encounter Discharge Information Details Patient Name: Marcus Loll T. Date of Service: 05/03/2018 8:45 AM Medical Record Number: [id number] Patient Account Number: [id number] Date of Birth/Sex: 1951-02-04 (67 y.o. M) Treating RN: Cornell Barman Primary Care Destony Prevost: BESS, KATY Other Clinician: Referring Aarian Griffie: BESS, KATY Treating Obadiah Dennard/Extender: Melburn Hake, HOYT Weeks in Treatment: 2 Encounter Discharge Information Items Discharge Condition: Stable Ambulatory Status: Ambulatory Discharge Destination: Home Transportation: Private Auto Accompanied By: self Schedule Follow-up Appointment: Yes Clinical Summary of Care: Electronic Signature(s) Signed:  05/07/2018 5:51:43 PM By: Gretta Cool, BSN, RN, CWS, Kim RN, BSN Entered By: Gretta Cool, BSN, RN, CWS, Kim on 05/03/2018 09:49:35 Corbitt, Huntley Dec ([id number]) -------------------------------------------------------------------------------- Lower Extremity Assessment Details Patient Name: Marcus Loll T. Date of Service: 05/03/2018 8:45 AM Medical Record Number: [id number] Patient Account Number: [id number] Date of Birth/Sex: Jul 28, 1951 (67 y.o. M) Treating RN: Montey Hora Primary Care Jerzee Jerome: BESS, KATY Other Clinician: Referring Khyan Oats: BESS, KATY Treating Arsen Mangione/Extender: STONE III, HOYT Weeks in Treatment: 2 Edema Assessment Assessed: [Left: No] [Right: No] Edema: [Left: Ye] [Right: s] Calf Left: Right: Point of Measurement: 34 cm From Medial Instep 39.4 cm cm Ankle Left: Right: Point of Measurement: 11 cm From Medial Instep 25.3 cm cm Vascular Assessment Claudication: Claudication Assessment [Left:None] Pulses: Dorsalis Pedis Palpable: [Left:Yes] Posterior Tibial Palpable: [Left:No] Extremity colors, hair growth, and conditions: Hair Growth on Extremity: [Left:No] Temperature of Extremity: [Left:Warm] Capillary Refill: [Left:< 3 seconds] Dependent Rubor: [Left:No] Blanched when Elevated: [Left:No] Lipodermatosclerosis: [Left:No] Toe Nail Assessment Left: Right: Thick: Yes Discolored: Yes Deformed: No Improper Length and Hygiene: No Electronic Signature(s) Signed: 05/03/2018 4:42:41 PM By: Montey Hora Signed: 05/22/2018 7:45:54 AM By: Harold Barban Entered By: Harold Barban on 05/03/2018 08:55:01 Bodi, Huntley Dec ([id number]) -------------------------------------------------------------------------------- Multi Wound Chart Details Patient Name: Marcus Loll T. Date of Service: 05/03/2018 8:45 AM Medical Record Number: [id number] Patient Account Number: [id number] Date of Birth/Sex: [birth year] (67 y.o. M) Treating RN: Montey Hora Primary Care Lyal Husted:  BESS, KATY Other Clinician: Referring Mireya Meditz: BESS, KATY Treating Isaih Bulger/Extender: STONE III, HOYT Weeks in Treatment: 2 Vital Signs Height(in): 69 Pulse(bpm): 73 Weight(lbs): 238 Blood Pressure(mmHg): 119/63 Body Mass Index(BMI): 35 Temperature(F): 97.6 Respiratory Rate 18 (breaths/min): Photos: [1:No Photos] [2:No Photos] [N/A:N/A] Wound Location: [1:Left, Midline, Anterior Lower Leg] [2:Left Toe Great] [N/A:N/A] Wounding Event: [1:Trauma] [2:Trauma] [N/A:N/A] Primary Etiology: [1:Diabetic Wound/Ulcer of the Lower Extremity] [2:Diabetic Wound/Ulcer of the Lower Extremity] [N/A:N/A] Secondary Etiology: [1:Trauma, Other] [2:Trauma, Other] [N/A:N/A] Date Acquired: [1:04/05/2018] [2:12/25/2017] [N/A:N/A] Weeks of Treatment: [1:2] [2:2] [N/A:N/A] Wound Status: [1:Open] [2:Open] [N/A:N/A] Pending Amputation on [1:No] [  2:Yes] [N/A:N/A] Presentation: Measurements L x W x D [1:2.8x2x0.1] [2:0.4x0.4x0.3] [N/A:N/A] (cm) Area (cm) : [1:4.398] [2:0.126] [N/A:N/A] Volume (cm) : [1:0.44] [2:0.038] [N/A:N/A] % Reduction in Area: [1:-47.30%] [2:-168.10%] [N/A:N/A] % Reduction in Volume: [1:-47.70%] [2:-660.00%] [N/A:N/A] Classification: [1:Grade 1] [2:Grade 1] [N/A:N/A] Periwound Skin Texture: [1:No Abnormalities Noted] [2:No Abnormalities Noted] [N/A:N/A] Periwound Skin Moisture: [1:No Abnormalities Noted] [2:No Abnormalities Noted] [N/A:N/A] Periwound Skin Color: [1:No Abnormalities Noted No] [2:No Abnormalities Noted No] [N/A:N/A N/A] Treatment Notes Electronic Signature(s) Signed: 05/03/2018 4:42:41 PM By: Montey Hora Entered By: Montey Hora on 05/03/2018 09:21:49 Marcus Lark ([id number]) -------------------------------------------------------------------------------- [place] Details Patient Name: Marcus Lark. Date of Service: 05/03/2018 8:45 AM Medical Record Number: [id number] Patient Account Number: [id number] Date of Birth/Sex: 1951-02-13  (67 y.o. M) Treating RN: Montey Hora Primary Care Kanyla Omeara: BESS, KATY Other Clinician: Referring Ramonica Grigg: BESS, KATY Treating Hideo Googe/Extender: STONE III, HOYT Weeks in Treatment: 2 Active Inactive ` Abuse / Safety / Falls / Self Care Management Nursing Diagnoses: Potential for falls Goals: Patient will remain injury free related to falls Date Initiated: 04/19/2018 Target Resolution Date: 05/04/2018 Goal Status: Active Interventions: Assess fall risk on admission and as needed Notes: ` Nutrition Nursing Diagnoses: Impaired glucose control: actual or potential Goals: Patient/caregiver agrees to and verbalizes understanding of need to use nutritional supplements and/or vitamins as prescribed Date Initiated: 04/19/2018 Target Resolution Date: 06/07/2018 Goal Status: Active Patient/caregiver verbalizes understanding of need to maintain therapeutic glucose control per primary care physician Date Initiated: 04/19/2018 Target Resolution Date: 05/31/2018 Goal Status: Active Interventions: Assess HgA1c results as ordered upon admission and as needed Assess patient nutrition upon admission and as needed per policy Notes: ` Orientation to the Wound Care Program Nursing Diagnoses: Knowledge deficit related to the wound healing center program Goals: Patient/caregiver will verbalize understanding of the [place] DAYMIAN, LILL ([id number]) Date Initiated: 04/19/2018 Target Resolution Date: 06/29/2018 Goal Status: Active Interventions: Provide education on orientation to the wound center Notes: ` Wound/Skin Impairment Nursing Diagnoses: Impaired tissue integrity Goals: Ulcer/skin breakdown will heal within 14 weeks Date Initiated: 04/19/2018 Target Resolution Date: 06/29/2018 Goal Status: Active Interventions: Assess patient/caregiver ability to obtain necessary supplies Assess patient/caregiver ability to perform ulcer/skin care regimen upon admission and  as needed Assess ulceration(s) every visit Notes: Electronic Signature(s) Signed: 05/03/2018 4:42:41 PM By: Montey Hora Entered By: Montey Hora on 05/03/2018 09:21:40 Reffitt, Huntley Dec ([id number]) -------------------------------------------------------------------------------- Pain Assessment Details Patient Name: Marcus Loll T. Date of Service: 05/03/2018 8:45 AM Medical Record Number: [id number] Patient Account Number: [id number] Date of Birth/Sex: 24-Jan-1951 (67 y.o. M) Treating RN: Montey Hora Primary Care Dyonna Jaspers: BESS, Valetta Fuller Other Clinician: Referring Lillyona Polasek: BESS, KATY Treating Missey Hasley/Extender: STONE III, HOYT Weeks in Treatment: 2 Active Problems Location of Pain Severity and Description of Pain Patient Has Paino No Site Locations Pain Management and Medication Current Pain Management: Electronic Signature(s) Signed: 05/03/2018 4:42:41 PM By: Montey Hora Signed: 05/22/2018 7:45:54 AM By: Harold Barban Entered By: Harold Barban on 05/03/2018 08:44:06 Chisholm, Huntley Dec ([id number]) -------------------------------------------------------------------------------- Patient/Caregiver Education Details Patient Name: Marcus Loll T. Date of Service: 05/03/2018 8:45 AM Medical Record Number: [id number] Patient Account Number: [id number] Date of Birth/Gender: [birth year] (67 y.o. M) Treating RN: Cornell Barman Primary Care Physician: Lillard Anes Other Clinician: Referring Physician: Lillard Anes Treating Physician/Extender: Sharalyn Ink in Treatment: 2 Education Assessment Education Provided To: Patient Education Topics Provided Wound/Skin Impairment: Handouts: Caring for Your Ulcer Spanish Methods: Explain/Verbal Responses: State content correctly Electronic Signature(s) Signed: 05/07/2018 5:51:43 PM By: Gretta Cool,  BSN, RN, CWS, Kim RN, BSN Entered By: Gretta Cool, BSN, RN, CWS, Kim on 05/03/2018 09:49:50 Hutchins, Huntley Dec  ([id number]) -------------------------------------------------------------------------------- Wound Assessment Details Patient Name: Marcus Lark. Date of Service: 05/03/2018 8:45 AM Medical Record Number: [id number] Patient Account Number: [id number] Date of Birth/Sex: Apr 26, 1951 (67 y.o. M) Treating RN: Montey Hora Primary Care Monick Rena: BESS, KATY Other Clinician: Referring Shi Blankenship: BESS, KATY Treating Dali Kraner/Extender: STONE III, HOYT Weeks in Treatment: 2 Wound Status Wound Number: 1 Primary Etiology: Diabetic Wound/Ulcer of the Lower Extremity Wound Location: Left, Midline, Anterior Lower Leg Secondary Trauma, Other Wounding Event: Trauma Etiology: Date Acquired: 04/05/2018 Wound Status: Open Weeks Of Treatment: 2 Clustered Wound: No Wound Measurements Length: (cm) 2.8 Width: (cm) 2 Depth: (cm) 0.1 Area: (cm) 4.398 Volume: (cm) 0.44 % Reduction in Area: -47.3% % Reduction in Volume: -47.7% Wound Description Classification: Grade 1 Periwound Skin Texture Texture Color No Abnormalities Noted: No No Abnormalities Noted: No Moisture No Abnormalities Noted: No Electronic Signature(s) Signed: 05/03/2018 4:42:41 PM By: Montey Hora Signed: 05/22/2018 7:45:54 AM By: Harold Barban Entered By: Harold Barban on 05/03/2018 08:57:36 Bartnick, Huntley Dec ([id number]) -------------------------------------------------------------------------------- Wound Assessment Details Patient Name: Marcus Loll T. Date of Service: 05/03/2018 8:45 AM Medical Record Number: [id number] Patient Account Number: [id number] Date of Birth/Sex: 1951-04-30 (67 y.o. M) Treating RN: Montey Hora Primary Care Zori Benbrook: BESS, KATY Other Clinician: Referring Jakeline Dave: BESS, KATY Treating Datha Kissinger/Extender: STONE III, HOYT Weeks in Treatment: 2 Wound Status Wound Number: 2 Primary Etiology: Diabetic Wound/Ulcer of the Lower Extremity Wound Location: Left Toe Great Secondary Trauma,  Other Wounding Event: Trauma Etiology: Date Acquired: 12/25/2017 Wound Status: Open Weeks Of Treatment: 2 Clustered Wound: No Pending Amputation On Presentation Wound Measurements Length: (cm) 0.4 Width: (cm) 0.4 Depth: (cm) 0.3 Area: (cm) 0.126 Volume: (cm) 0.038 % Reduction in Area: -168.1% % Reduction in Volume: -660% Wound Description Classification: Grade 1 Periwound Skin Texture Texture Color No Abnormalities Noted: No No Abnormalities Noted: No Moisture No Abnormalities Noted: No Electronic Signature(s) Signed: 05/03/2018 4:42:41 PM By: Montey Hora Signed: 05/22/2018 7:45:54 AM By: Harold Barban Entered By: Harold Barban on 05/03/2018 08:57:37 Donaghue, Huntley Dec ([id number]) -------------------------------------------------------------------------------- Vitals Details Patient Name: Marcus Loll T. Date of Service: 05/03/2018 8:45 AM Medical Record Number: [id number] Patient Account Number: [id number] Date of Birth/Sex: June 16, 1951 (67 y.o. M) Treating RN: Montey Hora Primary Care Thana Ramp: BESS, KATY Other Clinician: Referring Roney Youtz: BESS, KATY Treating Salah Burlison/Extender: STONE III, HOYT Weeks in Treatment: 2 Vital Signs Time Taken: 08:45 Temperature (F): 97.6 Height (in): 69 Pulse (bpm): 73 Weight (lbs): 238 Respiratory Rate (breaths/min): 18 Body Mass Index (BMI): 35.1 Blood Pressure (mmHg): 119/63 Reference Range: 80 - 120 mg / dl Electronic Signature(s) Signed: 05/22/2018 7:45:54 AM By: Harold Barban Entered By: Harold Barban on 05/03/2018 08:45:46

## 2018-05-26 NOTE — Progress Notes (Signed)
[name] ([id number]) Visit Report for 05/21/2018 Arrival Information Details Patient Name: [name], [name]. Date of Service: 05/21/2018 8:45 AM Medical Record Number: [id number] Patient Account Number: [id number] Date of Birth/Sex: 10/30/1951 (67 y.o. M) Treating RN: Montey Hora Primary Care Fabiana Dromgoole: BESS, KATY Other Clinician: Referring Kalicia Dufresne: BESS, KATY Treating Fortune Torosian/Extender: STONE III, HOYT Weeks in Treatment: 4 Visit Information History Since Last Visit Added or deleted any medications: No Patient Arrived: Ambulatory Any new allergies or adverse reactions: No Arrival Time: 08:38 Had a fall or experienced change in No Accompanied By: self activities of daily living that may affect Transfer Assistance: None risk of falls: Patient Identification Verified: Yes Signs or symptoms of abuse/neglect since last visito No Secondary Verification Process Completed: Yes Hospitalized since last visit: No Patient Has Alerts: Yes Implantable device outside of the clinic excluding No Patient Alerts: Type II Diabetic cellular tissue based products placed in the center since last visit: Has Dressing in Place as Prescribed: Yes Has Compression in Place as Prescribed: Yes Pain Present Now: No Electronic Signature(s) Signed: 05/21/2018 5:40:20 PM By: Montey Hora Entered By: Montey Hora on 05/21/2018 08:48:32 Dorion, Huntley Dec ([id number]) -------------------------------------------------------------------------------- Encounter Discharge Information Details Patient Name: Marcus Loll T. Date of Service: 05/21/2018 8:45 AM Medical Record Number: [id number] Patient Account Number: [id number] Date of Birth/Sex: Jun 17, 1951 (67 y.o. M) Treating RN: Roger Shelter Primary Care Arhaan Chesnut: BESS, KATY Other Clinician: Referring Suraj Ramdass: BESS, KATY Treating Nneka Blanda/Extender: Melburn Hake, HOYT Weeks in Treatment: 4 Encounter Discharge Information Items Discharge Condition:  Stable Ambulatory Status: Ambulatory Discharge Destination: Home Transportation: Private Auto Schedule Follow-up Appointment: Yes Clinical Summary of Care: Electronic Signature(s) Signed: 05/21/2018 5:16:34 PM By: Roger Shelter Entered By: Roger Shelter on 05/21/2018 09:48:51 Mundt, Huntley Dec ([id number]) -------------------------------------------------------------------------------- Lower Extremity Assessment Details Patient Name: Marcus Loll T. Date of Service: 05/21/2018 8:45 AM Medical Record Number: [id number] Patient Account Number: [id number] Date of Birth/Sex: [birth year] (67 y.o. M) Treating RN: Montey Hora Primary Care Chesney Klimaszewski: BESS, KATY Other Clinician: Referring Serah Nicoletti: BESS, KATY Treating Lynze Reddy/Extender: STONE III, HOYT Weeks in Treatment: 4 Edema Assessment Assessed: [Left: No] [Right: No] [Left: Edema] [Right: :] Calf Left: Right: Point of Measurement: 34 cm From Medial Instep 38.2 cm cm Ankle Left: Right: Point of Measurement: 11 cm From Medial Instep 24.6 cm cm Vascular Assessment Pulses: Dorsalis Pedis Palpable: [Left:Yes] Posterior Tibial Extremity colors, hair growth, and conditions: Extremity Color: [Left:Hyperpigmented] Hair Growth on Extremity: [Left:No] Temperature of Extremity: [Left:Warm] Capillary Refill: [Left:< 3 seconds] Toe Nail Assessment Left: Right: Thick: Yes Discolored: Yes Deformed: Yes Improper Length and Hygiene: No Electronic Signature(s) Signed: 05/21/2018 5:40:20 PM By: Montey Hora Entered By: Montey Hora on 05/21/2018 08:50:30 Broccoli, Huntley Dec ([id number]) -------------------------------------------------------------------------------- Multi Wound Chart Details Patient Name: Marcus Loll T. Date of Service: 05/21/2018 8:45 AM Medical Record Number: [id number] Patient Account Number: [id number] Date of Birth/Sex: Apr 14, 1951 (67 y.o. M) Treating RN: Ahmed Prima Primary Care Miriam Kestler: BESS, KATY  Other Clinician: Referring Moishy Laday: BESS, KATY Treating Adonias Demore/Extender: STONE III, HOYT Weeks in Treatment: 4 Vital Signs Height(in): 37 Pulse(bpm): 75 Weight(lbs): 238 Blood Pressure(mmHg): 121/65 Body Mass Index(BMI): 35 Temperature(F): 98.3 Respiratory Rate 18 (breaths/min): Photos: [1:No Photos] [2:No Photos] [N/A:N/A] Wound Location: [1:Left Lower Leg - Midline, Anterior] [2:Left Toe Great] [N/A:N/A] Wounding Event: [1:Trauma] [2:Trauma] [N/A:N/A] Primary Etiology: [1:Diabetic Wound/Ulcer of the Lower Extremity] [2:Diabetic Wound/Ulcer of the Lower Extremity] [N/A:N/A] Secondary Etiology: [1:Trauma, Other] [2:Trauma, Other] [N/A:N/A] Comorbid History: [1:Lymphedema, Arrhythmia, Coronary Artery Disease, Hypertension, Myocardial Infarction, Type II Diabetes, Neuropathy, Received Chemotherapy, Received  Radiation] [2:Lymphedema, Arrhythmia, Coronary Artery Disease, Hypertension,  Myocardial Infarction, Type II Diabetes, Neuropathy, Received Chemotherapy, Received Radiation] [N/A:N/A] Date Acquired: [1:04/05/2018] [2:12/25/2017] [N/A:N/A] Weeks of Treatment: [1:4] [2:4] [N/A:N/A] Wound Status: [1:Open] [2:Open] [N/A:N/A] Pending Amputation on [1:No] [2:Yes] [N/A:N/A] Presentation: Measurements L x W x D [1:1.8x1.4x0.1] [2:0.2x0.2x0.4] [N/A:N/A] (cm) Area (cm) : [1:1.979] [2:0.031] [N/A:N/A] Volume (cm) : [1:0.198] [2:0.013] [N/A:N/A] % Reduction in Area: [1:33.70%] [2:34.00%] [N/A:N/A] % Reduction in Volume: [1:33.60%] [2:-160.00%] [N/A:N/A] Starting Position 1 [2:12] (o'clock): Ending Position 1 [2:12] (o'clock): Maximum Distance 1 (cm): [2:0.4] Undermining: [1:No] [2:Yes] [N/A:N/A] Classification: [1:Grade 1] [2:Grade 1] [N/A:N/A] Exudate Amount: [1:Large] [2:None Present] [N/A:N/A] Exudate Type: [1:Serous] [2:N/A] [N/A:N/A] Exudate Color: [1:amber] [2:N/A] [N/A:N/A] Wound Margin: [1:Distinct, outline attached] [2:Flat and Intact] [N/A:N/A] Granulation Amount:  [1:Medium (34-66%)] [2:Small (1-33%)] [N/A:N/A] Granulation Quality: Red, Hyper-granulation Pink N/A Necrotic Amount: Medium (34-66%) Large (67-100%) N/A Exposed Structures: Fat Layer (Subcutaneous Fascia: No N/A Tissue) Exposed: Yes Fat Layer (Subcutaneous Tissue) Exposed: No Tendon: No Muscle: No Joint: No Bone: No Epithelialization: None None N/A Periwound Skin Texture: Excoriation: No Callus: Yes N/A Induration: No Excoriation: No Callus: No Induration: No Crepitus: No Crepitus: No Rash: No Rash: No Scarring: No Scarring: No Periwound Skin Moisture: Maceration: No Maceration: No N/A Dry/Scaly: No Dry/Scaly: No Periwound Skin Color: Hemosiderin Staining: Yes Atrophie Blanche: No N/A Atrophie Blanche: No Cyanosis: No Cyanosis: No Ecchymosis: No Ecchymosis: No Erythema: No Erythema: No Hemosiderin Staining: No Mottled: No Mottled: No Pallor: No Pallor: No Rubor: No Rubor: No Temperature: No Abnormality N/A N/A Tenderness on Palpation: No No N/A Wound Preparation: Ulcer Cleansing: Ulcer Cleansing: N/A Rinsed/Irrigated with Saline Rinsed/Irrigated with Saline Topical Anesthetic Applied: Topical Anesthetic Applied: Other: lidocaine 4% Other: lidocaine 4% Treatment Notes Electronic Signature(s) Signed: 05/24/2018 5:38:20 PM By: Alric Quan Entered By: Alric Quan on 05/21/2018 09:16:21 Preast, Huntley Dec ([id number]) -------------------------------------------------------------------------------- [place] Details Patient Name: [name] Date of Service: 05/21/2018 8:45 AM Medical Record Number: [id number] Patient Account Number: [id number] Date of Birth/Sex: [birth year] (67 y.o. M) Treating RN: Ahmed Prima Primary Care Kashari Chalmers: BESS, KATY Other Clinician: Referring Kelsha Older: BESS, KATY Treating Nicha Hemann/Extender: STONE III, HOYT Weeks in Treatment: 4 Active Inactive ` Abuse / Safety / Falls / Self Care  Management Nursing Diagnoses: Potential for falls Goals: Patient will remain injury free related to falls Date Initiated: 04/19/2018 Target Resolution Date: 05/04/2018 Goal Status: Active Interventions: Assess fall risk on admission and as needed Notes: ` Nutrition Nursing Diagnoses: Impaired glucose control: actual or potential Goals: Patient/caregiver agrees to and verbalizes understanding of need to use nutritional supplements and/or vitamins as prescribed Date Initiated: 04/19/2018 Target Resolution Date: 06/07/2018 Goal Status: Active Patient/caregiver verbalizes understanding of need to maintain therapeutic glucose control per primary care physician Date Initiated: 04/19/2018 Target Resolution Date: 05/31/2018 Goal Status: Active Interventions: Assess HgA1c results as ordered upon admission and as needed Assess patient nutrition upon admission and as needed per policy Notes: ` Orientation to the Wound Care Program Nursing Diagnoses: Knowledge deficit related to the wound healing center program Goals: Patient/caregiver will verbalize understanding of the [place] CHISOM, AUST ([id number]) Date Initiated: 04/19/2018 Target Resolution Date: 06/29/2018 Goal Status: Active Interventions: Provide education on orientation to the wound center Notes: ` Wound/Skin Impairment Nursing Diagnoses: Impaired tissue integrity Goals: Ulcer/skin breakdown will heal within 14 weeks Date Initiated: 04/19/2018 Target Resolution Date: 06/29/2018 Goal Status: Active Interventions: Assess patient/caregiver ability to obtain necessary supplies Assess patient/caregiver ability to perform ulcer/skin care regimen upon admission and as  needed Assess ulceration(s) every visit Notes: Electronic Signature(s) Signed: 05/24/2018 5:38:20 PM By: Alric Quan Entered By: Alric Quan on 05/21/2018 09:16:13 Seaberry, Huntley Dec  ([id number]) -------------------------------------------------------------------------------- Pain Assessment Details Patient Name: Marcus Loll T. Date of Service: 05/21/2018 8:45 AM Medical Record Number: [id number] Patient Account Number: [id number] Date of Birth/Sex: 1951/06/29 (67 y.o. M) Treating RN: Montey Hora Primary Care Gaddiel Cullens: BESS, KATY Other Clinician: Referring Drusilla Wampole: BESS, KATY Treating Meiah Zamudio/Extender: STONE III, HOYT Weeks in Treatment: 4 Active Problems Location of Pain Severity and Description of Pain Patient Has Paino Yes Site Locations Pain Location: Pain in Ulcers With Dressing Change: Yes Duration of the Pain. Constant / Intermittento Intermittent Pain Management and Medication Current Pain Management: Electronic Signature(s) Signed: 05/21/2018 5:40:20 PM By: Montey Hora Entered By: Montey Hora on 05/21/2018 08:48:48 Wimberly, Huntley Dec ([id number]) -------------------------------------------------------------------------------- Patient/Caregiver Education Details Patient Name: [name] Date of Service: 05/21/2018 8:45 AM Medical Record Number: [id number] Patient Account Number: [id number] Date of Birth/Gender: 1951-02-11 (67 y.o. M) Treating RN: Roger Shelter Primary Care Physician: BESS, KATY Other Clinician: Referring Physician: Lillard Anes Treating Physician/Extender: Melburn Hake, HOYT Weeks in Treatment: 4 Education Assessment Education Provided To: Patient Education Topics Provided Wound Debridement: Handouts: Wound Debridement Methods: Explain/Verbal Responses: State content correctly Wound/Skin Impairment: Handouts: Caring for Your Ulcer Methods: Explain/Verbal Responses: State content correctly Electronic Signature(s) Signed: 05/21/2018 5:16:34 PM By: Roger Shelter Entered By: Roger Shelter on 05/21/2018 09:49:10 Hulme, Huntley Dec  ([id number]) -------------------------------------------------------------------------------- Wound Assessment Details Patient Name: Marcus Loll T. Date of Service: 05/21/2018 8:45 AM Medical Record Number: [id number] Patient Account Number: [id number] Date of Birth/Sex: 10-16-1951 (67 y.o. M) Treating RN: Montey Hora Primary Care Jujuan Dugo: BESS, KATY Other Clinician: Referring Staphanie Harbison: BESS, KATY Treating Marguriete Wootan/Extender: STONE III, HOYT Weeks in Treatment: 4 Wound Status Wound Number: 1 Primary Diabetic Wound/Ulcer of the Lower Extremity Etiology: Wound Location: Left Lower Leg - Midline, Anterior Secondary Trauma, Other Wounding Event: Trauma Etiology: Date Acquired: 04/05/2018 Wound Open Weeks Of Treatment: 4 Status: Clustered Wound: No Comorbid Lymphedema, Arrhythmia, Coronary Artery History: Disease, Hypertension, Myocardial Infarction, Type II Diabetes, Neuropathy, Received Chemotherapy, Received Radiation Photos Photo Uploaded By: Montey Hora on 05/21/2018 15:32:29 Wound Measurements Length: (cm) 1.8 Width: (cm) 1.4 Depth: (cm) 0.1 Area: (cm) 1.979 Volume: (cm) 0.198 % Reduction in Area: 33.7% % Reduction in Volume: 33.6% Epithelialization: None Tunneling: No Undermining: No Wound Description Classification: Grade 1 Wound Margin: Distinct, outline attached Exudate Amount: Large Exudate Type: Serous Exudate Color: amber Foul Odor After Cleansing: No Slough/Fibrino Yes Wound Bed Granulation Amount: Medium (34-66%) Exposed Structure Granulation Quality: Red, Hyper-granulation Fat Layer (Subcutaneous Tissue) Exposed: Yes Necrotic Amount: Medium (34-66%) Necrotic Quality: Adherent Slough Periwound Skin Texture Spratling, Huntley Dec ([id number]) Texture Color No Abnormalities Noted: No No Abnormalities Noted: No Callus: No Atrophie Blanche: No Crepitus: No Cyanosis: No Excoriation: No Ecchymosis: No Induration: No Erythema: No Rash:  No Hemosiderin Staining: Yes Scarring: No Mottled: No Pallor: No Moisture Rubor: No No Abnormalities Noted: No Dry / Scaly: No Temperature / Pain Maceration: No Temperature: No Abnormality Wound Preparation Ulcer Cleansing: Rinsed/Irrigated with Saline Topical Anesthetic Applied: Other: lidocaine 4%, Treatment Notes Wound #1 (Left, Midline, Anterior Lower Leg) 1. Cleansed with: Clean wound with Normal Saline 2. Anesthetic Topical Lidocaine 4% cream to wound bed prior to debridement 3. Peri-wound Care: Moisturizing lotion 4. Dressing Applied: Hydrogel Prisma Ag 5. Secondary Dressing Applied ABD Pad 7. Secured with 3 Layer Compression System - Left Lower Extremity Notes Unna to secure at [employer]) Signed:  05/21/2018 5:40:20 PM By: Montey Hora Entered By: Montey Hora on 05/21/2018 08:55:11 Arington, Huntley Dec ([id number]) -------------------------------------------------------------------------------- Wound Assessment Details Patient Name: Marcus Loll T. Date of Service: 05/21/2018 8:45 AM Medical Record Number: [id number] Patient Account Number: [id number] Date of Birth/Sex: [birth year] (67 y.o. M) Treating RN: Montey Hora Primary Care Danel Requena: BESS, KATY Other Clinician: Referring Hailyn Zarr: BESS, KATY Treating March Joos/Extender: STONE III, HOYT Weeks in Treatment: 4 Wound Status Wound Number: 2 Primary Diabetic Wound/Ulcer of the Lower Extremity Etiology: Wound Location: Left Toe Great Secondary Trauma, Other Wounding Event: Trauma Etiology: Date Acquired: 12/25/2017 Wound Open Weeks Of Treatment: 4 Status: Clustered Wound: No Comorbid Lymphedema, Arrhythmia, Coronary Artery Pending Amputation On Presentation History: Disease, Hypertension, Myocardial Infarction, Type II Diabetes, Neuropathy, Received Chemotherapy, Received Radiation Photos Photo Uploaded By: Montey Hora on 05/21/2018 15:32:29 Wound Measurements Length: (cm)  0.2 Width: (cm) 0.2 Depth: (cm) 0.4 Area: (cm) 0.031 Volume: (cm) 0.013 % Reduction in Area: 34% % Reduction in Volume: -160% Epithelialization: None Tunneling: No Undermining: Yes Starting Position (o'clock): 12 Ending Position (o'clock): 12 Maximum Distance: (cm) 0.4 Wound Description Classification: Grade 1 Foul O Wound Margin: Flat and Intact Slough Exudate Amount: None Present dor After Cleansing: No /Fibrino No Wound Bed Granulation Amount: Small (1-33%) Exposed Structure Granulation Quality: Pink Fascia Exposed: No Necrotic Amount: Large (67-100%) Fat Layer (Subcutaneous Tissue) Exposed: No Necrotic Quality: Adherent Slough Tendon Exposed: No Bradly, Joao T. ([id number]) Muscle Exposed: No Joint Exposed: No Bone Exposed: No Periwound Skin Texture Texture Color No Abnormalities Noted: No No Abnormalities Noted: No Callus: Yes Atrophie Blanche: No Crepitus: No Cyanosis: No Excoriation: No Ecchymosis: No Induration: No Erythema: No Rash: No Hemosiderin Staining: No Scarring: No Mottled: No Pallor: No Moisture Rubor: No No Abnormalities Noted: No Dry / Scaly: No Maceration: No Wound Preparation Ulcer Cleansing: Rinsed/Irrigated with Saline Topical Anesthetic Applied: Other: lidocaine 4%, Treatment Notes Wound #2 (Left Toe Great) 1. Cleansed with: Clean wound with Normal Saline 2. Anesthetic Topical Lidocaine 4% cream to wound bed prior to debridement 4. Dressing Applied: Iodoflex 5. Secondary Dressing Applied Foam Kerlix/Conform Electronic Signature(s) Signed: 05/21/2018 5:40:20 PM By: Montey Hora Entered By: Montey Hora on 05/21/2018 08:56:38 Kropp, Huntley Dec ([id number]) -------------------------------------------------------------------------------- [place] Details Patient Name: [name] Date of Service: 05/21/2018 8:45 AM Medical Record Number: [id number] Patient Account Number: [id number] Date of Birth/Sex: [birth year]  (67 y.o. M) Treating RN: Montey Hora Primary Care Ariani Seier: BESS, KATY Other Clinician: Referring Reynard Christoffersen: BESS, KATY Treating Donnia Poplaski/Extender: STONE III, HOYT Weeks in Treatment: 4 Vital Signs Time Taken: 08:42 Temperature (F): 98.3 Height (in): 69 Pulse (bpm): 75 Weight (lbs): 238 Respiratory Rate (breaths/min): 18 Body Mass Index (BMI): 35.1 Blood Pressure (mmHg): 121/65 Reference Range: 80 - 120 mg / dl Electronic Signature(s) Signed: 05/21/2018 5:40:20 PM By: Montey Hora Entered By: Montey Hora on 05/21/2018 08:49:15

## 2018-05-26 NOTE — Progress Notes (Signed)
GRAVIEL, PAYEUR ([id number]) Visit Report for 05/21/2018 Chief Complaint Document Details Patient Name: [name], [name]. Date of Service: 05/21/2018 8:45 AM Medical Record Number: [id number] Patient Account Number: [id number] Date of Birth/Sex: 28-Jun-1951 (67 y.o. M) Treating RN: Ahmed Prima Primary Care Provider: BESS, Valetta Fuller Other Clinician: Referring Provider: BESS, KATY Treating Provider/Extender: STONE III, HOYT Weeks in Treatment: 4 Information Obtained from: Patient Chief Complaint Left lower leg and left great toe ulcer Electronic Signature(s) Signed: 05/22/2018 8:29:14 AM By: Worthy Keeler PA-C Entered By: Worthy Keeler on 05/21/2018 08:28:03 Glaeser, [name] ([id number]) -------------------------------------------------------------------------------- Debridement Details Patient Name: [name]. Date of Service: 05/21/2018 8:45 AM Medical Record Number: [id number] Patient Account Number: [id number] Date of Birth/Sex: 02-13-1951 (67 y.o. M) Treating RN: Ahmed Prima Primary Care Provider: BESS, KATY Other Clinician: Referring Provider: BESS, KATY Treating Provider/Extender: STONE III, HOYT Weeks in Treatment: 4 Debridement Performed for Wound #2 Left Toe Great Assessment: Performed By: Physician STONE III, HOYT E., PA-C Debridement Type: Debridement Severity of Tissue Pre Fat layer exposed Debridement: Pre-procedure Verification/Time Yes - 09:19 Out Taken: Start Time: 09:19 Pain Control: Lidocaine 4% Topical Solution Total Area Debrided (L x W): 0.2 (cm) x 0.2 (cm) = 0.04 (cm) Tissue and other material Viable, Non-Viable, Callus, Slough, Subcutaneous, Fibrin/Exudate, Slough debrided: Level: Skin/Subcutaneous Tissue Debridement Description: Excisional Instrument: Curette Bleeding: Minimum Hemostasis Achieved: Pressure End Time: 09:26 Procedural Pain: 0 Post Procedural Pain: 0 Response to Treatment: Procedure was tolerated well Level of  Consciousness: Awake and Alert Post Procedure Vitals: Temperature: 98.3 Pulse: 75 Respiratory Rate: 18 Blood Pressure: Systolic Blood Pressure: 462 Diastolic Blood Pressure: 65 Post Debridement Measurements of Total Wound Length: (cm) 0.4 Width: (cm) 0.4 Depth: (cm) 0.2 Volume: (cm) 0.025 Character of Wound/Ulcer Post Debridement: Requires Further Debridement Severity of Tissue Post Debridement: Fat layer exposed Post Procedure Diagnosis Same as Pre-procedure Electronic Signature(s) Signed: 05/22/2018 8:29:14 AM By: Worthy Keeler PA-C Signed: 05/24/2018 5:38:20 PM By: Luanna Salk ([id number]) Entered By: Alric Quan on 05/21/2018 09:27:16 [name], [name] ([id number]) -------------------------------------------------------------------------------- HPI Details Patient Name: Marcus Loll T. Date of Service: 05/21/2018 8:45 AM Medical Record Number: [id number] Patient Account Number: [id number] Date of Birth/Sex: [birth year] (67 y.o. M) Treating RN: Ahmed Prima Primary Care Provider: BESS, KATY Other Clinician: Referring Provider: BESS, KATY Treating Provider/Extender: STONE III, HOYT Weeks in Treatment: 4 History of Present Illness Associated Signs and Symptoms: Patient has a history of chronic venous insufficiency, diabetes mellitus type II, its reform fraction, hypertension HPI Description: 04/19/18 on evaluation today patient presents initially concerning an ulcer of his left great toe and left anterior shin. He states that the shin was definitely a trauma injury where he struck this on a metal pole injuring leg. In regard to the distal left great toe he's really not sure what happened here although he does have a significant callous noted as well. He does have a pacemaker. Fortunately his ABI was 0.98 on the right and 0.83 on the left this appears to be doing fairly well. Overall I'm pleased with that. Nonetheless he states that this really has  not seem to be improving. He has been tolerating the dressing changes without complication. The left anterior leg ulcer began on 04/05/18 according to what he tells [place] today although one note I did have for review said it was April 5. I'm inclined to believe it was the fifth she did have an office visit on 04/01/18 with his family nurse practitioner Faith Rogue. Nonetheless either  way it's been going on this month for several weeks. The left great toe has actually been present since January 1 he does not know how this occurred. Fortunately has no pain at the site although he does have pain on the left anterior shin. His hemoglobin A1c is 8.0 in March 2019. 04/26/18 on evaluation today patient presents for follow-up concerning his life into your [place] also as well as his left great toe also. Fortunately the shin actually appears to be doing better on evaluation today in my opinion I'm not seeing as much in the way of fluffernutter on the surface although it does still require debridement today this is improved. Size wise it's really not much different. With that being said the total ulcer actually appears to be a little bit more macerated today I'm not sure why they state that several days ago when this was changed that was not the case. Fortunately is not having severe pain although both areas do hurt the shin is worse. 05/03/18 on evaluation today patient appears to be doing better in regard to his left first toe and left shin ulcers. He is been tolerating the dressing changes without complication and the good news is this seems to be showing signs of getting better. Overall I'm very pleased with the progress that has been made over the past week. Patient is still having pain especially in regard to the left shin. 05/10/18 on evaluation today patient appears to be doing rather well in regard to his two ulcers. The toe ulcer appears to be smaller he does have some undermining however in this had  to be cleaned out just a little bit. With that being said overall I feel like this is showing signs of improvement. The left anterior shin ulcer also showing signs of improvement at this time. There does not appear to be any evidence of infection which is good news. 05/21/18 on evaluation today patient appears to be doing very well in regard to his left shin ulcer. He has been tolerating the dressing changes without complication. With that being said he is having issues at this point with his great toe on the left. We have not x-rayed that at this point I think we may need to. Nonetheless he has pain really at the roughly 5 o'clock location but nowhere else and I really cannot explain this I do not see any obvious form body. Nonetheless he continues to have issues with getting this area to close it's not progressing as nicely. Electronic Signature(s) Signed: 05/22/2018 8:29:14 AM By: Worthy Keeler PA-C Entered By: Worthy Keeler on 05/22/2018 08:24:46 [name], [name] ([id number]) -------------------------------------------------------------------------------- Physical Exam Details Patient Name: Marcus Loll T. Date of Service: 05/21/2018 8:45 AM Medical Record Number: [id number] Patient Account Number: [id number] Date of Birth/Sex: 1951/08/09 (67 y.o. M) Treating RN: Ahmed Prima Primary Care Provider: BESS, KATY Other Clinician: Referring Provider: BESS, KATY Treating Provider/Extender: STONE III, HOYT Weeks in Treatment: 4 Constitutional Well-nourished and well-hydrated in no acute distress. Respiratory normal breathing without difficulty. Psychiatric this patient is able to make decisions and demonstrates good insight into disease process. Alert and Oriented x 3. pleasant and cooperative. Notes Patient's left shin wound shows no evidence of slough noted at this point no debridement required. The left great toe did require sharp debridement I was able to clean out a significant  amount of slough and subcutaneous tissue post debridement the wound bed appears to be better although he still has a lot of  discomfort at the 5 o'clock location. Electronic Signature(s) Signed: 05/22/2018 8:29:14 AM By: Worthy Keeler PA-C Entered By: Worthy Keeler on 05/22/2018 08:25:28 [name], [name] ([id number]) -------------------------------------------------------------------------------- Physician Orders Details Patient Name: [name]. Date of Service: 05/21/2018 8:45 AM Medical Record Number: [id number] Patient Account Number: [id number] Date of Birth/Sex: 1951/08/08 (67 y.o. M) Treating RN: Ahmed Prima Primary Care Provider: BESS, KATY Other Clinician: Referring Provider: BESS, KATY Treating Provider/Extender: STONE III, HOYT Weeks in Treatment: 4 Verbal / Phone Orders: Yes Clinician: Pinkerton, Debi Read Back and Verified: Yes Diagnosis Coding ICD-10 Coding Code Description I87.2 Venous insufficiency (chronic) (peripheral) M54.650P Laceration without foreign body, left lower leg, initial encounter T46.568 Non-pressure chronic ulcer of other part of left lower leg with fat layer exposed L97.521 Non-pressure chronic ulcer of other part of left foot limited to breakdown of skin E11.621 Type 2 diabetes mellitus with foot ulcer I63.9 Cerebral infarction, unspecified I10 Essential (primary) hypertension Wound Cleansing Wound #1 Left,Midline,Anterior Lower Leg o Cleanse wound with mild soap and water o May shower with protection. Wound #2 Left Toe Great o Cleanse wound with mild soap and water o May Shower, gently pat wound dry prior to applying new dressing. Anesthetic (add to Medication List) Wound #1 Left,Midline,Anterior Lower Leg o Topical Lidocaine 4% cream applied to wound bed prior to debridement (In Clinic Only). Wound #2 Left Toe Great o Topical Lidocaine 4% cream applied to wound bed prior to debridement (In Clinic Only). Primary Wound  Dressing Wound #1 Left,Midline,Anterior Lower Leg o Hydrogel o Silver Collagen Wound #2 Left Toe Great o Iodoflex Secondary Dressing Wound #1 Left,Midline,Anterior Lower Leg o ABD pad o Non-adherent pad Wound #2 Left Toe Great o Gauze and Kerlix/Conform - secure with coban o Foam [name], [name] ([id number]) Dressing Change Frequency Wound #1 Left,Midline,Anterior Lower Leg o Change dressing every week Wound #2 Left Toe Great o Change dressing every other day. Follow-up Appointments Wound #1 Left,Midline,Anterior Lower Leg o Return Appointment in 2 weeks. o Nurse Visit as needed Wound #2 Left Toe Great o Return Appointment in 2 weeks. o Nurse Visit as needed Edema Control Wound #1 Left,Midline,Anterior Lower Leg o 3 Layer Compression System - Left Lower Extremity - unna to anchor o Patient to wear own compression stockings - right lower leg o Elevate legs to the level of the heart and pump ankles as often as possible Additional Orders / Instructions Wound #1 Left,Midline,Anterior Lower Leg o Stop Smoking o Vitamin A; Vitamin C, Zinc - Please add a multivitamin with 100% of these o Increase protein intake. o Activity as tolerated Wound #2 Left Toe Great o Stop Smoking o Vitamin A; Vitamin C, Zinc - Please add a multivitamin with 100% of these o Increase protein intake. o Activity as tolerated Radiology o X-ray, toes - left foot, left toe great Patient Medications Allergies: bee venom protein (honey bee), penicillin, oyster shell Notifications Medication Indication Start End lidocaine DOSE 1 - topical 4 % cream - 1 cream topical Electronic Signature(s) Signed: 05/22/2018 8:29:14 AM By: Worthy Keeler PA-C Signed: 05/24/2018 5:38:20 PM By: Alric Quan Entered By: Alric Quan on 05/21/2018 09:33:50 [name], [name] ([id number]) DELMONT, PROSCH  ([id number]) -------------------------------------------------------------------------------- Prescription 05/21/2018 Patient Name: Marcus Loll T. Provider: Worthy Keeler PA-C Date of Birth: 1951-08-06 NPI#: [id number] Sex: Valeta Harms: [id number] Phone #: [phone] License #: Patient Address: [place], [place] [address] [address]  [place], [place] [address] [phone] Allergies bee venom protein (honey bee) penicillin oyster shell Medication Medication: Route: Strength: Form: lidocaine 4 % topical cream topical 4% cream Class: TOPICAL LOCAL ANESTHETICS Dose: Frequency / Time: Indication: 1 1 cream topical Number of Refills: Number of Units: 0 Generic Substitution: Start Date: End Date: One Time Use: Substitution Permitted No Note to Pharmacy: Signature(s): Date(s): Electronic Signature(s) Signed: 05/22/2018 8:29:14 AM By: Worthy Keeler PA-C Signed: 05/24/2018 5:38:20 PM By: Luanna Salk ([id number]) Entered By: Alric Quan on 05/21/2018 09:33:51 [name], [name] ([id number]) --------------------------------------------------------------------------------  Problem List Details Patient Name: Marcus Loll T. Date of Service: 05/21/2018 8:45 AM Medical Record Number: [id number] Patient Account Number: [id number] Date of Birth/Sex: 11-26-1951 (67 y.o. M) Treating RN: Ahmed Prima Primary Care Provider: BESS, KATY Other Clinician: Referring Provider: BESS, KATY Treating Provider/Extender: Melburn Hake, HOYT Weeks in Treatment: 4 Active Problems ICD-10 Impacting Encounter Code Description Active Date Wound Healing Diagnosis I87.2 Venous insufficiency (chronic) (peripheral) 04/19/2018 No Yes S81.812A Laceration without foreign body, left lower leg, initial 04/19/2018 No Yes encounter L97.822 Non-pressure chronic ulcer of other  part of left lower leg with 04/19/2018 No Yes fat layer exposed L97.521 Non-pressure chronic ulcer of other part of left foot limited to 04/19/2018 No Yes breakdown of skin E11.621 Type 2 diabetes mellitus with foot ulcer 04/19/2018 No Yes I63.9 Cerebral infarction, unspecified 04/19/2018 No Yes I10 Essential (primary) hypertension 04/19/2018 No Yes Inactive Problems Resolved Problems Electronic Signature(s) Signed: 05/22/2018 8:29:14 AM By: Worthy Keeler PA-C Entered By: Worthy Keeler on 05/21/2018 08:27:57 [name], [name] ([id number]) -------------------------------------------------------------------------------- Progress Note Details Patient Name: Marcus Loll T. Date of Service: 05/21/2018 8:45 AM Medical Record Number: [id number] Patient Account Number: [id number] Date of Birth/Sex: 1951/05/22 (67 y.o. M) Treating RN: Ahmed Prima Primary Care Provider: BESS, KATY Other Clinician: Referring Provider: BESS, KATY Treating Provider/Extender: STONE III, HOYT Weeks in Treatment: 4 Subjective Chief Complaint Information obtained from Patient Left lower leg and left great toe ulcer History of Present Illness (HPI) The following HPI elements were documented for the patient's wound: Associated Signs and Symptoms: Patient has a history of chronic venous insufficiency, diabetes mellitus type II, its reform fraction, hypertension 04/19/18 on evaluation today patient presents initially concerning an ulcer of his left great toe and left anterior shin. He states that the shin was definitely a trauma injury where he struck this on a metal pole injuring leg. In regard to the distal left great toe he's really not sure what happened here although he does have a significant callous noted as well. He does have a pacemaker. Fortunately his ABI was 0.98 on the right and 0.83 on the left this appears to be doing fairly well. Overall I'm pleased with that. Nonetheless he states that this really has  not seem to be improving. He has been tolerating the dressing changes without complication. The left anterior leg ulcer began on 04/05/18 according to what he tells [place] today although one note I did have for review said it was April 5. I'm inclined to believe it was the fifth she did have an office visit on 04/01/18 with his family nurse practitioner Faith Rogue. Nonetheless either way it's been going on this month for several weeks. The left great toe has actually been present since January 1 he does not know how this occurred. Fortunately has no pain at the site although he does have pain on the left anterior shin. His hemoglobin A1c is 8.0 in March  2019. 04/26/18 on evaluation today patient presents for follow-up concerning his life into your [place] also as well as his left great toe also. Fortunately the shin actually appears to be doing better on evaluation today in my opinion I'm not seeing as much in the way of fluffernutter on the surface although it does still require debridement today this is improved. Size wise it's really not much different. With that being said the total ulcer actually appears to be a little bit more macerated today I'm not sure why they state that several days ago when this was changed that was not the case. Fortunately is not having severe pain although both areas do hurt the shin is worse. 05/03/18 on evaluation today patient appears to be doing better in regard to his left first toe and left shin ulcers. He is been tolerating the dressing changes without complication and the good news is this seems to be showing signs of getting better. Overall I'm very pleased with the progress that has been made over the past week. Patient is still having pain especially in regard to the left shin. 05/10/18 on evaluation today patient appears to be doing rather well in regard to his two ulcers. The toe ulcer appears to be smaller he does have some undermining however in this had  to be cleaned out just a little bit. With that being said overall I feel like this is showing signs of improvement. The left anterior shin ulcer also showing signs of improvement at this time. There does not appear to be any evidence of infection which is good news. 05/21/18 on evaluation today patient appears to be doing very well in regard to his left shin ulcer. He has been tolerating the dressing changes without complication. With that being said he is having issues at this point with his great toe on the left. We have not x-rayed that at this point I think we may need to. Nonetheless he has pain really at the roughly 5 o'clock location but nowhere else and I really cannot explain this I do not see any obvious form body. Nonetheless he continues to have issues with getting this area to close it's not progressing as nicely. Patient History Information obtained from Patient. ABEM, SHADDIX ([id number]) Family History Cancer - Father, Diabetes - Father, Heart Disease - Mother,Father, Hypertension - Mother, Kidney Disease - Child, No family history of Lung Disease, Seizures, Stroke, Thyroid Problems, Tuberculosis. Social History Current every day smoker - 1 - 1/2 pack daily, Marital Status - Married, Alcohol Use - Rarely, Drug Use - No History, Caffeine Use - Daily - coffee. Review of Systems (ROS) Constitutional Symptoms (General Health) Denies complaints or symptoms of Fever, Chills. Respiratory The patient has no complaints or symptoms. Cardiovascular The patient has no complaints or symptoms. Psychiatric The patient has no complaints or symptoms. Objective Constitutional Well-nourished and well-hydrated in no acute distress. Vitals Time Taken: 8:42 AM, Height: 69 in, Weight: 238 lbs, BMI: 35.1, Temperature: 98.3 F, Pulse: 75 bpm, Respiratory Rate: 18 breaths/min, Blood Pressure: 121/65 mmHg. Respiratory normal breathing without difficulty. Psychiatric this patient is able to  make decisions and demonstrates good insight into disease process. Alert and Oriented x 3. pleasant and cooperative. General Notes: Patient's left shin wound shows no evidence of slough noted at this point no debridement required. The left great toe did require sharp debridement I was able to clean out a significant amount of slough and subcutaneous tissue post debridement the wound bed appears  to be better although he still has a lot of discomfort at the 5 o'clock location. Integumentary (Hair, Skin) Wound #1 status is Open. Original cause of wound was Trauma. The wound is located on the Left,Midline,Anterior Lower Leg. The wound measures 1.8cm length x 1.4cm width x 0.1cm depth; 1.979cm^2 area and 0.198cm^3 volume. There is Fat Layer (Subcutaneous Tissue) Exposed exposed. There is no tunneling or undermining noted. There is a large amount of serous drainage noted. The wound margin is distinct with the outline attached to the wound base. There is medium (34-66%) red, hyper - granulation within the wound bed. There is a medium (34-66%) amount of necrotic tissue within the wound bed including Adherent Slough. The periwound skin appearance exhibited: Hemosiderin Staining. The periwound skin appearance did not exhibit: Callus, Crepitus, Excoriation, Induration, Rash, Scarring, Dry/Scaly, Maceration, Atrophie Blanche, Cyanosis, Ecchymosis, Mottled, Pallor, Rubor, Erythema. Periwound temperature was noted as No Abnormality. Wound #2 status is Open. Original cause of wound was Trauma. The wound is located on the Left Toe Great. The wound [name], Marcus T. ([id number]) measures 0.2cm length x 0.2cm width x 0.4cm depth; 0.031cm^2 area and 0.013cm^3 volume. There is no tunneling noted, however, there is undermining starting at 12:00 and ending at 12:00 with a maximum distance of 0.4cm. There is a none present amount of drainage noted. The wound margin is flat and intact. There is small (1-33%) pink  granulation within the wound bed. There is a large (67-100%) amount of necrotic tissue within the wound bed including Adherent Slough. The periwound skin appearance exhibited: Callus. The periwound skin appearance did not exhibit: Crepitus, Excoriation, Induration, Rash, Scarring, Dry/Scaly, Maceration, Atrophie Blanche, Cyanosis, Ecchymosis, Hemosiderin Staining, Mottled, Pallor, Rubor, Erythema. Assessment Active Problems ICD-10 I87.2 - Venous insufficiency (chronic) (peripheral) V25.366Y - Laceration without foreign body, left lower leg, initial encounter L97.822 - Non-pressure chronic ulcer of other part of left lower leg with fat layer exposed L97.521 - Non-pressure chronic ulcer of other part of left foot limited to breakdown of skin E11.621 - Type 2 diabetes mellitus with foot ulcer I63.9 - Cerebral infarction, unspecified I10 - Essential (primary) hypertension Procedures Wound #2 Pre-procedure diagnosis of Wound #2 is a Diabetic Wound/Ulcer of the Lower Extremity located on the Left Toe Great .Severity of Tissue Pre Debridement is: Fat layer exposed. There was a Excisional Skin/Subcutaneous Tissue Debridement with a total area of 0.04 sq cm performed by STONE III, HOYT E., PA-C. With the following instrument(s): Curette to remove Viable and Non-Viable tissue/material. Material removed includes Callus, Subcutaneous Tissue, Slough, and Fibrin/Exudate after achieving pain control using Lidocaine 4% Topical Solution. No specimens were taken. A time out was conducted at 09:19, prior to the start of the procedure. A Minimum amount of bleeding was controlled with Pressure. The procedure was tolerated well with a pain level of 0 throughout and a pain level of 0 following the procedure. Patient s Level of Consciousness post procedure was recorded as Awake and Alert. Post Debridement Measurements: 0.4cm length x 0.4cm width x 0.2cm depth; 0.025cm^3 volume. Character of Wound/Ulcer Post  Debridement requires further debridement. Severity of Tissue Post Debridement is: Fat layer exposed. Post procedure Diagnosis Wound #2: Same as Pre-Procedure Plan Wound Cleansing: Wound #1 Left,Midline,Anterior Lower Leg: Cleanse wound with mild soap and water May shower with protection. Wound #2 Left Toe Great: Cleanse wound with mild soap and water [name], [name] ([id number]) May Shower, gently pat wound dry prior to applying new dressing. Anesthetic (add to Medication List): Wound #1 Left,Midline,Anterior  Lower Leg: Topical Lidocaine 4% cream applied to wound bed prior to debridement (In Clinic Only). Wound #2 Left Toe Great: Topical Lidocaine 4% cream applied to wound bed prior to debridement (In Clinic Only). Primary Wound Dressing: Wound #1 Left,Midline,Anterior Lower Leg: Hydrogel Silver Collagen Wound #2 Left Toe Great: Iodoflex Secondary Dressing: Wound #1 Left,Midline,Anterior Lower Leg: ABD pad Non-adherent pad Wound #2 Left Toe Great: Gauze and Kerlix/Conform - secure with coban Foam Dressing Change Frequency: Wound #1 Left,Midline,Anterior Lower Leg: Change dressing every week Wound #2 Left Toe Great: Change dressing every other day. Follow-up Appointments: Wound #1 Left,Midline,Anterior Lower Leg: Return Appointment in 2 weeks. Nurse Visit as needed Wound #2 Left Toe Great: Return Appointment in 2 weeks. Nurse Visit as needed Edema Control: Wound #1 Left,Midline,Anterior Lower Leg: 3 Layer Compression System - Left Lower Extremity - unna to anchor Patient to wear own compression stockings - right lower leg Elevate legs to the level of the heart and pump ankles as often as possible Additional Orders / Instructions: Wound #1 Left,Midline,Anterior Lower Leg: Stop Smoking Vitamin A; Vitamin C, Zinc - Please add a multivitamin with 100% of these Increase protein intake. Activity as tolerated Wound #2 Left Toe Great: Stop Smoking Vitamin A; Vitamin C,  Zinc - Please add a multivitamin with 100% of these Increase protein intake. Activity as tolerated Radiology ordered were: X-ray, toes - left foot, left toe great The following medication(s) was prescribed: lidocaine topical 4 % cream 1 1 cream topical was prescribed at facility I'm gonna suggest currently that we switch to Iodoflex in regard to the left great toe we will continue with the other orders for wound care in regard to the left shin. Patient is in agreement with this plan. AMERICO, VALLERY ([id number]) Please see above for specific wound care orders. We will see patient for re-evaluation in 1 week(s) here in the clinic. If anything worsens or changes patient will contact our office for additional recommendations. We will go ahead and send him for an x-ray as well. Electronic Signature(s) Signed: 05/22/2018 8:29:14 AM By: Worthy Keeler PA-C Entered By: Worthy Keeler on 05/22/2018 08:26:04 [name] ([id number]) -------------------------------------------------------------------------------- ROS/PFSH Details Patient Name: [name]. Date of Service: 05/21/2018 8:45 AM Medical Record Number: [id number] Patient Account Number: [id number] Date of Birth/Sex: 1951/10/27 (67 y.o. M) Treating RN: Ahmed Prima Primary Care Provider: BESS, KATY Other Clinician: Referring Provider: BESS, KATY Treating Provider/Extender: STONE III, HOYT Weeks in Treatment: 4 Information Obtained From Patient Wound History Do you currently have one or more open woundso Yes How many open wounds do you currently haveo 2 Approximately how long have you had your woundso 2 weeks How have you been treating your wound(s) until nowo yes Doctor Has your wound(s) ever healed and then re-openedo No Have you had any lab work done in the past montho No Have you tested positive for an antibiotic resistant organism (MRSA, VRE)o No Have you tested positive for osteomyelitis (bone infection)o  No Have you had any tests for circulation on your legso No Have you had other problems associated with your woundso Infection, Swelling Constitutional Symptoms (General Health) Complaints and Symptoms: Negative for: Fever; Chills Eyes Medical History: Negative for: Cataracts; Glaucoma; Optic Neuritis Ear/Nose/Mouth/Throat Medical History: Negative for: Chronic sinus problems/congestion Hematologic/Lymphatic Medical History: Positive for: Lymphedema Negative for: Anemia; Hemophilia; Human Immunodeficiency Virus; Sickle Cell Disease Respiratory Complaints and Symptoms: No Complaints or Symptoms Medical History: Negative for: Aspiration; Asthma; Chronic Obstructive Pulmonary Disease (COPD); Pneumothorax;  Sleep Apnea; Tuberculosis Cardiovascular Complaints and Symptoms: No Complaints or Symptoms Medical History: MARTEL, GALVAN ([id number]) Positive for: Arrhythmia - pacemaker; Coronary Artery Disease; Hypertension; Myocardial Infarction Negative for: Deep Vein Thrombosis; Hypotension; Peripheral Arterial Disease; Peripheral Venous Disease; Phlebitis; Vasculitis Gastrointestinal Medical History: Negative for: Cirrhosis ; Colitis; Crohnos; Hepatitis A; Hepatitis B; Hepatitis C Endocrine Medical History: Positive for: Type II Diabetes Negative for: Type I Diabetes Time with diabetes: 5 years Treated with: Oral agents Blood sugar tested every day: Yes Tested : daily AM Blood sugar testing results: Breakfast: 174 Genitourinary Medical History: Negative for: End Stage Renal Disease Immunological Medical History: Negative for: Lupus Erythematosus; Raynaudos; Scleroderma Integumentary (Skin) Medical History: Negative for: History of Burn; History of pressure wounds Musculoskeletal Medical History: Negative for: Gout; Rheumatoid Arthritis; Osteoarthritis; Osteomyelitis Neurologic Medical History: Positive for: Neuropathy Negative for: Dementia; Quadriplegia; Paraplegia;  Seizure Disorder Oncologic Medical History: Positive for: Received Chemotherapy; Received Radiation - Seeds prostate Psychiatric Complaints and Symptoms: No Complaints or Symptoms Medical History: Negative for: Anorexia/bulimia; Confinement Anxiety Byland, KHADAR MONGER ([id number]) Immunizations Pneumococcal Vaccine: Received Pneumococcal Vaccination: Yes Tetanus Vaccine: Last tetanus shot: 12/25/2017 Implantable Devices Family and Social History Cancer: Yes - Father; Diabetes: Yes - Father; Heart Disease: Yes - Mother,Father; Hypertension: Yes - Mother; Kidney Disease: Yes - Child; Lung Disease: No; Seizures: No; Stroke: No; Thyroid Problems: No; Tuberculosis: No; Current every day smoker - 1 - 1/2 pack daily; Marital Status - Married; Alcohol Use: Rarely; Drug Use: No History; Caffeine Use: Daily - coffee; Financial Concerns: No; Food, Clothing or Shelter Needs: No; Support System Lacking: No; Transportation Concerns: No Physician Affirmation I have reviewed and agree with the above information. Electronic Signature(s) Signed: 05/22/2018 8:29:14 AM By: Worthy Keeler PA-C Signed: 05/24/2018 5:38:20 PM By: Alric Quan Entered By: Worthy Keeler on 05/22/2018 08:25:05 Bohnenkamp, [name] ([id number]) -------------------------------------------------------------------------------- SuperBill Details Patient Name: [name]. Date of Service: 05/21/2018 Medical Record Number: [id number] Patient Account Number: [id number] Date of Birth/Sex: [birth year] (67 y.o. M) Treating RN: Ahmed Prima Primary Care Provider: BESS, KATY Other Clinician: Referring Provider: BESS, KATY Treating Provider/Extender: STONE III, HOYT Weeks in Treatment: 4 Diagnosis Coding ICD-10 Codes Code Description I87.2 Venous insufficiency (chronic) (peripheral) S81.812A Laceration without foreign body, left lower leg, initial encounter L97.822 Non-pressure chronic ulcer of other part of left lower leg with  fat layer exposed L97.521 Non-pressure chronic ulcer of other part of left foot limited to breakdown of skin E11.621 Type 2 diabetes mellitus with foot ulcer I63.9 Cerebral infarction, unspecified I10 Essential (primary) hypertension Facility Procedures CPT4 Code Description: 58099833 11042 - DEB SUBQ TISSUE 20 SQ CM/< ICD-10 Diagnosis Description L97.521 Non-pressure chronic ulcer of other part of left foot limited t Modifier: o breakdown of s Quantity: 1 kin Physician Procedures CPT4 Code Description: [id number] - WC PHYS SUBQ TISS 20 SQ CM ICD-10 Diagnosis Description L97.521 Non-pressure chronic ulcer of other part of left foot limited t Modifier: o breakdown of sk Quantity: 1 in Electronic Signature(s) Signed: 05/22/2018 8:29:14 AM By: Worthy Keeler PA-C Entered By: Worthy Keeler on 05/22/2018 08:26:31

## 2018-05-28 ENCOUNTER — Encounter: Payer: PPO | Attending: Physician Assistant | Admitting: Physician Assistant

## 2018-05-28 DIAGNOSIS — Z950 Presence of cardiac pacemaker: Secondary | ICD-10-CM | POA: Diagnosis not present

## 2018-05-28 DIAGNOSIS — I252 Old myocardial infarction: Secondary | ICD-10-CM | POA: Diagnosis not present

## 2018-05-28 DIAGNOSIS — L97522 Non-pressure chronic ulcer of other part of left foot with fat layer exposed: Secondary | ICD-10-CM | POA: Diagnosis not present

## 2018-05-28 DIAGNOSIS — L97521 Non-pressure chronic ulcer of other part of left foot limited to breakdown of skin: Secondary | ICD-10-CM | POA: Diagnosis not present

## 2018-05-28 DIAGNOSIS — I872 Venous insufficiency (chronic) (peripheral): Secondary | ICD-10-CM | POA: Insufficient documentation

## 2018-05-28 DIAGNOSIS — E1151 Type 2 diabetes mellitus with diabetic peripheral angiopathy without gangrene: Secondary | ICD-10-CM | POA: Diagnosis not present

## 2018-05-28 DIAGNOSIS — I10 Essential (primary) hypertension: Secondary | ICD-10-CM | POA: Diagnosis not present

## 2018-05-28 DIAGNOSIS — L97822 Non-pressure chronic ulcer of other part of left lower leg with fat layer exposed: Secondary | ICD-10-CM | POA: Insufficient documentation

## 2018-05-28 DIAGNOSIS — E1140 Type 2 diabetes mellitus with diabetic neuropathy, unspecified: Secondary | ICD-10-CM | POA: Insufficient documentation

## 2018-05-28 DIAGNOSIS — E11621 Type 2 diabetes mellitus with foot ulcer: Secondary | ICD-10-CM | POA: Diagnosis not present

## 2018-05-28 DIAGNOSIS — I2510 Atherosclerotic heart disease of native coronary artery without angina pectoris: Secondary | ICD-10-CM | POA: Diagnosis not present

## 2018-05-29 NOTE — Progress Notes (Signed)
Marcus, [name] ([id number]) Visit Report for 05/28/2018 Arrival Information Details Patient Name: [name], [name]. Date of Service: 05/28/2018 8:00 AM Medical Record Number: [id number] Patient Account Number: [id number] Date of Birth/Sex: 03-Jul-1951 (67 y.o. M) Treating RN: Roger Shelter Primary Care Jakerra Floyd: BESS, KATY Other Clinician: Referring Laith Antonelli: BESS, KATY Treating Cody Oliger/Extender: STONE III, HOYT Weeks in Treatment: 5 Visit Information History Since Last Visit All ordered tests and consults were completed: No Patient Arrived: Ambulatory Added or deleted any medications: No Arrival Time: 08:11 Any new allergies or adverse reactions: No Accompanied By: self Had a fall or experienced change in No Transfer Assistance: None activities of daily living that may affect Patient Identification Verified: Yes risk of falls: Secondary Verification Process Completed: Yes Signs or symptoms of abuse/neglect since last visito No Patient Has Alerts: Yes Hospitalized since last visit: No Patient Alerts: Type II Diabetic Implantable device outside of the clinic excluding No cellular tissue based products placed in the center since last visit: Pain Present Now: No Electronic Signature(s) Signed: 05/28/2018 8:52:00 AM By: Roger Shelter Entered By: Roger Shelter on 05/28/2018 08:11:34 Grove, Marcus Dec ([id number]) -------------------------------------------------------------------------------- Encounter Discharge Information Details Patient Name: Marcus Loll T. Date of Service: 05/28/2018 8:00 AM Medical Record Number: [id number] Patient Account Number: [id number] Date of Birth/Sex: 08-30-1951 (67 y.o. M) Treating RN: Roger Shelter Primary Care Tashira Torre: BESS, KATY Other Clinician: Referring Lennox Dolberry: BESS, KATY Treating Jhony Antrim/Extender: Melburn Hake, HOYT Weeks in Treatment: 5 Encounter Discharge Information Items Discharge Condition: Stable Ambulatory Status:  Ambulatory Discharge Destination: Home Transportation: Private Auto Schedule Follow-up Appointment: Yes Clinical Summary of Care: Electronic Signature(s) Signed: 05/28/2018 12:56:26 PM By: Roger Shelter Entered By: Roger Shelter on 05/28/2018 09:10:56 Gombos, Marcus Dec ([id number]) -------------------------------------------------------------------------------- Lower Extremity Assessment Details Patient Name: Marcus Loll T. Date of Service: 05/28/2018 8:00 AM Medical Record Number: [id number] Patient Account Number: [id number] Date of Birth/Sex: 15-Oct-1951 (67 y.o. M) Treating RN: Roger Shelter Primary Care Maxime Beckner: BESS, KATY Other Clinician: Referring Adden Strout: BESS, KATY Treating Lucine Bilski/Extender: STONE III, HOYT Weeks in Treatment: 5 Edema Assessment Assessed: [Left: No] [Right: No] Edema: [Left: N] [Right: o] Calf Left: Right: Point of Measurement: 34 cm From Medial Instep 38.2 cm cm Ankle Left: Right: Point of Measurement: 11 cm From Medial Instep 24.4 cm cm Vascular Assessment Claudication: Claudication Assessment [Left:None] Pulses: Dorsalis Pedis Palpable: [Left:Yes] Posterior Tibial Extremity colors, hair growth, and conditions: Extremity Color: [Left:Hyperpigmented] Hair Growth on Extremity: [Left:No] Temperature of Extremity: [Left:Warm] Capillary Refill: [Left:< 3 seconds] Toe Nail Assessment Left: Right: Thick: No Discolored: No Deformed: No Improper Length and Hygiene: No Electronic Signature(s) Signed: 05/28/2018 8:52:00 AM By: Roger Shelter Entered By: Roger Shelter on 05/28/2018 08:29:56 [name], Marcus Dec ([id number]) -------------------------------------------------------------------------------- Multi Wound Chart Details Patient Name: Marcus Loll T. Date of Service: 05/28/2018 8:00 AM Medical Record Number: [id number] Patient Account Number: [id number] Date of Birth/Sex: Sep 23, 1951 (67 y.o. M) Treating RN: Ahmed Prima Primary  Care Marykate Heuberger: BESS, KATY Other Clinician: Referring Saifullah Jolley: BESS, KATY Treating Feliz Lincoln/Extender: STONE III, HOYT Weeks in Treatment: 5 Vital Signs Height(in): 40 Pulse(bpm): 72 Weight(lbs): 238 Blood Pressure(mmHg): 132/68 Body Mass Index(BMI): 35 Temperature(F): Respiratory Rate 18 (breaths/min): Photos: [N/A:N/A] Wound Location: Left Lower Leg - Midline, Left Toe Great N/A Anterior Wounding Event: Trauma Trauma N/A Primary Etiology: Diabetic Wound/Ulcer of the Diabetic Wound/Ulcer of the N/A Lower Extremity Lower Extremity Secondary Etiology: Trauma, Other Trauma, Other N/A Comorbid History: Lymphedema, Arrhythmia, Lymphedema, Arrhythmia, N/A Coronary Artery Disease, Coronary Artery Disease, Hypertension, Myocardial Hypertension, Myocardial Infarction, Type II Diabetes, Infarction, Type  II Diabetes, Neuropathy, Received Neuropathy, Received Chemotherapy, Received Chemotherapy, Received Radiation Radiation Date Acquired: 04/05/2018 12/25/2017 N/A Weeks of Treatment: 5 5 N/A Wound Status: Open Open N/A Pending Amputation on No Yes N/A Presentation: Measurements L x W x D 1.8x1.3x0.1 0.4x0.4x0.4 N/A (cm) Area (cm) : 1.838 0.126 N/A Volume (cm) : 0.184 0.05 N/A % Reduction in Area: 38.40% -168.10% N/A % Reduction in Volume: 38.30% -900.00% N/A Classification: Grade 1 Grade 1 N/A Exudate Amount: Medium Medium N/A Exudate Type: Serous Serous N/A Exudate Color: amber amber N/A Wound Margin: Distinct, outline attached Flat and Intact N/A Lack, Marcus Dec ([id number]) Granulation Amount: Large (67-100%) Small (1-33%) N/A Granulation Quality: Red, Hyper-granulation Pink N/A Necrotic Amount: Small (1-33%) Large (67-100%) N/A Exposed Structures: Fat Layer (Subcutaneous Fascia: No N/A Tissue) Exposed: Yes Fat Layer (Subcutaneous Tissue) Exposed: No Tendon: No Muscle: No Joint: No Bone: No Epithelialization: Small (1-33%) Small (1-33%) N/A Periwound Skin  Texture: Excoriation: No Callus: Yes N/A Induration: No Excoriation: No Callus: No Induration: No Crepitus: No Crepitus: No Rash: No Rash: No Scarring: No Scarring: No Periwound Skin Moisture: Maceration: No Maceration: No N/A Dry/Scaly: No Dry/Scaly: No Periwound Skin Color: Hemosiderin Staining: Yes Atrophie Blanche: No N/A Atrophie Blanche: No Cyanosis: No Cyanosis: No Ecchymosis: No Ecchymosis: No Erythema: No Erythema: No Hemosiderin Staining: No Mottled: No Mottled: No Pallor: No Pallor: No Rubor: No Rubor: No Temperature: No Abnormality N/A N/A Tenderness on Palpation: No No N/A Wound Preparation: Ulcer Cleansing: Ulcer Cleansing: N/A Rinsed/Irrigated with Saline Rinsed/Irrigated with Saline Topical Anesthetic Applied: Topical Anesthetic Applied: Other: lidocaine 4% Other: lidocaine 4% Treatment Notes Electronic Signature(s) Signed: 05/28/2018 5:20:44 PM By: Alric Quan Entered By: Alric Quan on 05/28/2018 08:39:50 Robarge, Marcus Dec ([id number]) -------------------------------------------------------------------------------- [place] Details Patient Name: [name] Date of Service: 05/28/2018 8:00 AM Medical Record Number: [id number] Patient Account Number: [id number] Date of Birth/Sex: 1951-03-27 (67 y.o. M) Treating RN: Ahmed Prima Primary Care Reyli Schroth: BESS, KATY Other Clinician: Referring Ronella Plunk: BESS, KATY Treating Calena Salem/Extender: STONE III, HOYT Weeks in Treatment: 5 Active Inactive ` Abuse / Safety / Falls / Self Care Management Nursing Diagnoses: Potential for falls Goals: Patient will remain injury free related to falls Date Initiated: 04/19/2018 Target Resolution Date: 05/04/2018 Goal Status: Active Interventions: Assess fall risk on admission and as needed Notes: ` Nutrition Nursing Diagnoses: Impaired glucose control: actual or potential Goals: Patient/caregiver agrees to and  verbalizes understanding of need to use nutritional supplements and/or vitamins as prescribed Date Initiated: 04/19/2018 Target Resolution Date: 06/07/2018 Goal Status: Active Patient/caregiver verbalizes understanding of need to maintain therapeutic glucose control per primary care physician Date Initiated: 04/19/2018 Target Resolution Date: 05/31/2018 Goal Status: Active Interventions: Assess HgA1c results as ordered upon admission and as needed Assess patient nutrition upon admission and as needed per policy Notes: ` Orientation to the Wound Care Program Nursing Diagnoses: Knowledge deficit related to the wound healing center program Goals: Patient/caregiver will verbalize understanding of the [place] DONN, ZANETTI ([id number]) Date Initiated: 04/19/2018 Target Resolution Date: 06/29/2018 Goal Status: Active Interventions: Provide education on orientation to the wound center Notes: ` Wound/Skin Impairment Nursing Diagnoses: Impaired tissue integrity Goals: Ulcer/skin breakdown will heal within 14 weeks Date Initiated: 04/19/2018 Target Resolution Date: 06/29/2018 Goal Status: Active Interventions: Assess patient/caregiver ability to obtain necessary supplies Assess patient/caregiver ability to perform ulcer/skin care regimen upon admission and as needed Assess ulceration(s) every visit Notes: Electronic Signature(s) Signed: 05/28/2018 5:20:44 PM By: Alric Quan Entered By: Alric Quan on 05/28/2018  08:39:37 ARLES, RUMBOLD ([id number]) -------------------------------------------------------------------------------- Pain Assessment Details Patient Name: CAISON, HEARN. Date of Service: 05/28/2018 8:00 AM Medical Record Number: [id number] Patient Account Number: [id number] Date of Birth/Sex: [birth year] (67 y.o. M) Treating RN: Roger Shelter Primary Care Dearius Hoffmann: BESS, Valetta Fuller Other Clinician: Referring Shields Pautz: BESS, KATY Treating  Braelynn Benning/Extender: STONE III, HOYT Weeks in Treatment: 5 Active Problems Location of Pain Severity and Description of Pain Patient Has Paino No Site Locations Pain Management and Medication Current Pain Management: Electronic Signature(s) Signed: 05/28/2018 8:52:00 AM By: Roger Shelter Entered By: Roger Shelter on 05/28/2018 08:11:40 Radziewicz, Marcus Dec ([id number]) -------------------------------------------------------------------------------- Patient/Caregiver Education Details Patient Name: [name] Date of Service: 05/28/2018 8:00 AM Medical Record Number: [id number] Patient Account Number: [id number] Date of Birth/Gender: 09-19-1951 (67 y.o. M) Treating RN: Roger Shelter Primary Care Physician: BESS, KATY Other Clinician: Referring Physician: Lillard Anes Treating Physician/Extender: Melburn Hake, HOYT Weeks in Treatment: 5 Education Assessment Education Provided To: Patient Education Topics Provided Wound Debridement: Handouts: Wound Debridement Methods: Explain/Verbal Responses: State content correctly Wound/Skin Impairment: Handouts: Caring for Your Ulcer Methods: Explain/Verbal Responses: State content correctly Electronic Signature(s) Signed: 05/28/2018 12:56:26 PM By: Roger Shelter Entered By: Roger Shelter on 05/28/2018 09:11:11 Blevins, Marcus Dec ([id number]) -------------------------------------------------------------------------------- Wound Assessment Details Patient Name: Marcus Loll T. Date of Service: 05/28/2018 8:00 AM Medical Record Number: [id number] Patient Account Number: [id number] Date of Birth/Sex: September 03, 1951 (67 y.o. M) Treating RN: Roger Shelter Primary Care Justinian Miano: BESS, KATY Other Clinician: Referring Minh Jasper: BESS, KATY Treating Aarika Moon/Extender: STONE III, HOYT Weeks in Treatment: 5 Wound Status Wound Number: 1 Primary Diabetic Wound/Ulcer of the Lower Extremity Etiology: Wound Location: Left Lower Leg - Midline,  Anterior Secondary Trauma, Other Wounding Event: Trauma Etiology: Date Acquired: 04/05/2018 Wound Open Weeks Of Treatment: 5 Status: Clustered Wound: No Comorbid Lymphedema, Arrhythmia, Coronary Artery History: Disease, Hypertension, Myocardial Infarction, Type II Diabetes, Neuropathy, Received Chemotherapy, Received Radiation Photos Photo Uploaded By: Roger Shelter on 05/28/2018 08:34:40 Wound Measurements Length: (cm) 1.8 Width: (cm) 1.3 Depth: (cm) 0.1 Area: (cm) 1.838 Volume: (cm) 0.184 % Reduction in Area: 38.4% % Reduction in Volume: 38.3% Epithelialization: Small (1-33%) Tunneling: No Undermining: No Wound Description Classification: Grade 1 Wound Margin: Distinct, outline attached Exudate Amount: Medium Exudate Type: Serous Exudate Color: amber Foul Odor After Cleansing: No Slough/Fibrino Yes Wound Bed Granulation Amount: Large (67-100%) Exposed Structure Granulation Quality: Red, Hyper-granulation Fat Layer (Subcutaneous Tissue) Exposed: Yes Necrotic Amount: Small (1-33%) Necrotic Quality: Adherent Slough Periwound Skin Texture Nipp, Marcus Dec ([id number]) Texture Color No Abnormalities Noted: No No Abnormalities Noted: No Callus: No Atrophie Blanche: No Crepitus: No Cyanosis: No Excoriation: No Ecchymosis: No Induration: No Erythema: No Rash: No Hemosiderin Staining: Yes Scarring: No Mottled: No Pallor: No Moisture Rubor: No No Abnormalities Noted: No Dry / Scaly: No Temperature / Pain Maceration: No Temperature: No Abnormality Wound Preparation Ulcer Cleansing: Rinsed/Irrigated with Saline Topical Anesthetic Applied: Other: lidocaine 4%, Treatment Notes Wound #1 (Left, Midline, Anterior Lower Leg) 1. Cleansed with: Clean wound with Normal Saline 2. Anesthetic Topical Lidocaine 4% cream to wound bed prior to debridement 4. Dressing Applied: Hydrogel Prisma Ag 5. Secondary Dressing Applied ABD [place] 7. Secured  with 3 Layer Compression System - Left Lower Extremity Notes Unna to secure at top Electronic Signature(s) Signed: 05/28/2018 8:52:00 AM By: Roger Shelter Entered By: Roger Shelter on 05/28/2018 08:28:05 [name] ([id number]) -------------------------------------------------------------------------------- Wound Assessment Details Patient Name: Marcus Loll T. Date of Service: 05/28/2018 8:00 AM Medical Record Number: [id number] Patient Account Number: [id number]  Date of Birth/Sex: 1951-03-13 (67 y.o. M) Treating RN: Roger Shelter Primary Care Kadija Cruzen: BESS, KATY Other Clinician: Referring Destry Dauber: BESS, KATY Treating Danni Leabo/Extender: STONE III, HOYT Weeks in Treatment: 5 Wound Status Wound Number: 2 Primary Diabetic Wound/Ulcer of the Lower Extremity Etiology: Wound Location: Left Toe Great Secondary Trauma, Other Wounding Event: Trauma Etiology: Date Acquired: 12/25/2017 Wound Open Weeks Of Treatment: 5 Status: Clustered Wound: No Comorbid Lymphedema, Arrhythmia, Coronary Artery Pending Amputation On Presentation History: Disease, Hypertension, Myocardial Infarction, Type II Diabetes, Neuropathy, Received Chemotherapy, Received Radiation Photos Photo Uploaded By: Roger Shelter on 05/28/2018 08:34:40 Wound Measurements Length: (cm) 0.4 Width: (cm) 0.4 Depth: (cm) 0.4 Area: (cm) 0.126 Volume: (cm) 0.05 % Reduction in Area: -168.1% % Reduction in Volume: -900% Epithelialization: Small (1-33%) Tunneling: No Undermining: No Wound Description Classification: Grade 1 Wound Margin: Flat and Intact Exudate Amount: Medium Exudate Type: Serous Exudate Color: amber Foul Odor After Cleansing: No Slough/Fibrino Yes Wound Bed Granulation Amount: Small (1-33%) Exposed Structure Granulation Quality: Pink Fascia Exposed: No Necrotic Amount: Large (67-100%) Fat Layer (Subcutaneous Tissue) Exposed: No Necrotic Quality: Adherent Slough Tendon Exposed:  No Muscle Exposed: No Joint Exposed: No [place], Marcus Dec ([id number]) Bone Exposed: No Periwound Skin Texture Texture Color No Abnormalities Noted: No No Abnormalities Noted: No Callus: Yes Atrophie Blanche: No Crepitus: No Cyanosis: No Excoriation: No Ecchymosis: No Induration: No Erythema: No Rash: No Hemosiderin Staining: No Scarring: No Mottled: No Pallor: No Moisture Rubor: No No Abnormalities Noted: No Dry / Scaly: No Maceration: No Wound Preparation Ulcer Cleansing: Rinsed/Irrigated with Saline Topical Anesthetic Applied: Other: lidocaine 4%, Treatment Notes Wound #2 (Left Toe Great) 1. Cleansed with: Clean wound with Normal Saline 2. Anesthetic Topical Lidocaine 4% cream to wound bed prior to debridement 4. Dressing Applied: Iodoflex 5. Secondary Dressing Applied Dry Gauze Notes conform wrap with tape Electronic Signature(s) Signed: 05/28/2018 8:52:00 AM By: Roger Shelter Entered By: Roger Shelter on 05/28/2018 65:99:35 [name] ([id number]) -------------------------------------------------------------------------------- [place] Details Patient Name: [name] Date of Service: 05/28/2018 8:00 AM Medical Record Number: [id number] Patient Account Number: [id number] Date of Birth/Sex: 1951/02/24 (67 y.o. M) Treating RN: Roger Shelter Primary Care Edmundo Tedesco: BESS, KATY Other Clinician: Referring Sonya Gunnoe: BESS, KATY Treating Kairon Shock/Extender: STONE III, HOYT Weeks in Treatment: 5 Vital Signs Time Taken: 08:14 Pulse (bpm): 72 Height (in): 69 Respiratory Rate (breaths/min): 18 Weight (lbs): 238 Blood Pressure (mmHg): 132/68 Body Mass Index (BMI): 35.1 Reference Range: 80 - 120 mg / dl Electronic Signature(s) Signed: 05/28/2018 8:52:00 AM By: Roger Shelter Entered By: Roger Shelter on 05/28/2018 08:15:21

## 2018-05-29 NOTE — Progress Notes (Signed)
GUILHERME, SCHWENKE ([id number]) Visit Report for 05/28/2018 Chief Complaint Document Details Patient Name: [name], [name]. Date of Service: 05/28/2018 8:00 AM Medical Record Number: [id number] Patient Account Number: [id number] Date of Birth/Sex: [birth year] (67 y.o. M) Treating RN: Ahmed Prima Primary Care Provider: BESS, Valetta Fuller Other Clinician: Referring Provider: BESS, KATY Treating Provider/Extender: STONE III, Sravya Grissom Weeks in Treatment: 5 Information Obtained from: Patient Chief Complaint Left lower leg and left great toe ulcer Electronic Signature(s) Signed: 05/29/2018 8:38:06 AM By: Worthy Keeler PA-C Entered By: Worthy Keeler on 05/28/2018 08:09:56 [name], [name] ([id number]) -------------------------------------------------------------------------------- Debridement Details Patient Name: [name]. Date of Service: 05/28/2018 8:00 AM Medical Record Number: [id number] Patient Account Number: [id number] Date of Birth/Sex: 02-02-1951 (67 y.o. M) Treating RN: Ahmed Prima Primary Care Provider: BESS, KATY Other Clinician: Referring Provider: BESS, KATY Treating Provider/Extender: STONE III, Tagg Eustice Weeks in Treatment: 5 Debridement Performed for Wound #2 Left Toe Great Assessment: Performed By: Physician STONE III, Ziyad Dyar E., PA-C Debridement Type: Debridement Severity of Tissue Pre Fat layer exposed Debridement: Pre-procedure Verification/Time Yes - 08:41 Out Taken: Start Time: 08:41 Pain Control: Lidocaine 4% Topical Solution Total Area Debrided (L x W): 0.4 (cm) x 0.4 (cm) = 0.16 (cm) Tissue and other material Viable, Non-Viable, Callus, Slough, Subcutaneous, Slough debrided: Level: Skin/Subcutaneous Tissue Debridement Description: Excisional Instrument: Curette Bleeding: Minimum Hemostasis Achieved: Pressure End Time: 08:47 Procedural Pain: 0 Post Procedural Pain: 0 Response to Treatment: Procedure was tolerated well Level of Consciousness: Awake and  Alert Post Procedure Vitals: Temperature: 97.8 Pulse: 72 Respiratory Rate: 18 Blood Pressure: Systolic Blood Pressure: 244 Diastolic Blood Pressure: 68 Post Debridement Measurements of Total Wound Length: (cm) 0.5 Width: (cm) 0.5 Depth: (cm) 0.4 Volume: (cm) 0.079 Character of Wound/Ulcer Post Debridement: Requires Further Debridement Severity of Tissue Post Debridement: Fat layer exposed Post Procedure Diagnosis Same as Pre-procedure Electronic Signature(s) Signed: 05/28/2018 5:20:44 PM By: Alric Quan Signed: 05/29/2018 8:38:06 AM By: Derrick Ravel, [name] ([id number]) Entered By: Alric Quan on 05/28/2018 08:48:34 [name], [name] ([id number]) -------------------------------------------------------------------------------- HPI Details Patient Name: Marcus Loll T. Date of Service: 05/28/2018 8:00 AM Medical Record Number: [id number] Patient Account Number: [id number] Date of Birth/Sex: August 29, 1951 (67 y.o. M) Treating RN: Ahmed Prima Primary Care Provider: BESS, KATY Other Clinician: Referring Provider: BESS, KATY Treating Provider/Extender: STONE III, Jamesina Gaugh Weeks in Treatment: 5 History of Present Illness Associated Signs and Symptoms: Patient has a history of chronic venous insufficiency, diabetes mellitus type II, its reform fraction, hypertension HPI Description: 04/19/18 on evaluation today patient presents initially concerning an ulcer of his left great toe and left anterior shin. He states that the shin was definitely a trauma injury where he struck this on a metal pole injuring leg. In regard to the distal left great toe he's really not sure what happened here although he does have a significant callous noted as well. He does have a pacemaker. Fortunately his ABI was 0.98 on the right and 0.83 on the left this appears to be doing fairly well. Overall I'm pleased with that. Nonetheless he states that this really has not seem to be improving.  He has been tolerating the dressing changes without complication. The left anterior leg ulcer began on 04/05/18 according to what he tells [place] today although one note I did have for review said it was April 5. I'm inclined to believe it was the fifth she did have an office visit on 04/01/18 with his family nurse practitioner Faith Rogue. Nonetheless either way  it's been going on this month for several weeks. The left great toe has actually been present since January 1 he does not know how this occurred. Fortunately has no pain at the site although he does have pain on the left anterior shin. His hemoglobin A1c is 8.0 in March 2019. 04/26/18 on evaluation today patient presents for follow-up concerning his life into your lower [place] also as well as his left great toe also. Fortunately the shin actually appears to be doing better on evaluation today in my opinion I'm not seeing as much in the way of fluffernutter on the surface although it does still require debridement today this is improved. Size wise it's really not much different. With that being said the total ulcer actually appears to be a little bit more macerated today I'm not sure why they state that several days ago when this was changed that was not the case. Fortunately is not having severe pain although both areas do hurt the shin is worse. 05/03/18 on evaluation today patient appears to be doing better in regard to his left first toe and left shin ulcers. He is been tolerating the dressing changes without complication and the good news is this seems to be showing signs of getting better. Overall I'm very pleased with the progress that has been made over the past week. Patient is still having pain especially in regard to the left shin. 05/10/18 on evaluation today patient appears to be doing rather well in regard to his two ulcers. The toe ulcer appears to be smaller he does have some undermining however in this had to be cleaned out just a  little bit. With that being said overall I feel like this is showing signs of improvement. The left anterior shin ulcer also showing signs of improvement at this time. There does not appear to be any evidence of infection which is good news. 05/21/18 on evaluation today patient appears to be doing very well in regard to his left shin ulcer. He has been tolerating the dressing changes without complication. With that being said he is having issues at this point with his great toe on the left. We have not x-rayed that at this point I think we may need to. Nonetheless he has pain really at the roughly 5 o'clock location but nowhere else and I really cannot explain this I do not see any obvious form body. Nonetheless he continues to have issues with getting this area to close it's not progressing as nicely. 05/28/18 on evaluation today patient appears to be doing very well in regard to his left lower extremity anterior ulcer. He has been tolerating the dressing changes without complication. Overall this is making excellent progress with the collagen dressing. His left great toe x-ray did return and showed that he had no if you that normality. He did have a plantar heel spur but this is more degenerative. Otherwise just arthritis was noted no osteomyelitis and no obvious form body was visualized. Overall things seem to be going fairly well. With that being said he still has pain when looking at the toe from the plantar aspect at roughly the six back to the 4 o'clock location which is not quite as bad as last time but still hurts more than any other part of his wound. It does appear the Iodoflex has been of benefit however. Electronic Signature(s) Signed: 05/29/2018 8:38:06 AM By: Ralene Bathe ([id number]) Entered By: Worthy Keeler on 05/28/2018 09:07:47  SHAWAN, [name] ([id number]) -------------------------------------------------------------------------------- Physical Exam  Details Patient Name: AADEN, BUCKMAN. Date of Service: 05/28/2018 8:00 AM Medical Record Number: [id number] Patient Account Number: [id number] Date of Birth/Sex: 1951/01/12 (67 y.o. M) Treating RN: Ahmed Prima Primary Care Provider: BESS, KATY Other Clinician: Referring Provider: BESS, KATY Treating Provider/Extender: STONE III, Asriel Westrup Weeks in Treatment: 5 Constitutional Well-nourished and well-hydrated in no acute distress. Respiratory normal breathing without difficulty. Psychiatric this patient is able to make decisions and demonstrates good insight into disease process. Alert and Oriented x 3. pleasant and cooperative. Notes Patient's left anterior lower extremity ulcer show signs of excellent epithelialization and in fact this is at least half the size that it was last week. With that being said the left great toe ulcer does appear to be cleaning up more nicely. Although there still a lot of slough noted in the base of the wound. It did require sharp debridement today and I did specifically debride in the area of slight undermining at the four-6 o'clock location. It did appear that the charrette was snagging somewhat on something that eventually I was able to debride away and it appeared to be somewhat shiny almost like it might've been a small piece of glass. With that being said the patient also states that he has been working with cacti and that it could've been something from that. Obviously I wasn't able to fully identify as it was wrapped up in other tissue and I could not separate it necessarily. Nonetheless after debridement he states that he's no longer having the pain that he was having prior it does appear that something was embedded at the site. Electronic Signature(s) Signed: 05/29/2018 8:38:06 AM By: Worthy Keeler PA-C Entered By: Worthy Keeler on 05/28/2018 09:10:33 [name], [name]  ([id number]) -------------------------------------------------------------------------------- Physician Orders Details Patient Name: [name]. Date of Service: 05/28/2018 8:00 AM Medical Record Number: [id number] Patient Account Number: [id number] Date of Birth/Sex: December 18, 1951 (67 y.o. M) Treating RN: Ahmed Prima Primary Care Provider: BESS, KATY Other Clinician: Referring Provider: BESS, KATY Treating Provider/Extender: STONE III, Rush Salce Weeks in Treatment: 5 Verbal / Phone Orders: Yes Clinician: Pinkerton, Debi Read Back and Verified: Yes Diagnosis Coding ICD-10 Coding Code Description I87.2 Venous insufficiency (chronic) (peripheral) O97.353G Laceration without foreign body, left lower leg, initial encounter D92.426 Non-pressure chronic ulcer of other part of left lower leg with fat layer exposed L97.521 Non-pressure chronic ulcer of other part of left foot limited to breakdown of skin E11.621 Type 2 diabetes mellitus with foot ulcer I63.9 Cerebral infarction, unspecified I10 Essential (primary) hypertension Wound Cleansing Wound #1 Left,Midline,Anterior Lower Leg o Cleanse wound with mild soap and water Wound #2 Left Toe Great o Cleanse wound with mild soap and water o May Shower, gently pat wound dry prior to applying new dressing. Anesthetic (add to Medication List) Wound #1 Left,Midline,Anterior Lower Leg o Topical Lidocaine 4% cream applied to wound bed prior to debridement (In Clinic Only). Wound #2 Left Toe Great o Topical Lidocaine 4% cream applied to wound bed prior to debridement (In Clinic Only). Primary Wound Dressing Wound #1 Left,Midline,Anterior Lower Leg o Hydrogel o Collagen Wound #2 Left Toe Great o Iodoflex Secondary Dressing Wound #1 Left,Midline,Anterior Lower Leg o ABD pad o Non-adherent pad Wound #2 Left Toe Great o Gauze and Kerlix/Conform - secure with coban o Foam Scheaffer, [name] ([id number]) Dressing Change  Frequency Wound #1 Left,Midline,Anterior Lower Leg o Change dressing every week Wound #2 Left Toe Great o Change dressing every  other day. Follow-up Appointments Wound #1 Left,Midline,Anterior Lower Leg o Return Appointment in 1 week. o Nurse Visit as needed Wound #2 Left Toe Great o Return Appointment in 1 week. o Nurse Visit as needed Edema Control Wound #1 Left,Midline,Anterior Lower Leg o 3 Layer Compression System - Left Lower Extremity - unna to anchor o Patient to wear own compression stockings - right lower leg o Elevate legs to the level of the heart and pump ankles as often as possible Additional Orders / Instructions Wound #1 Left,Midline,Anterior Lower Leg o Stop Smoking o Vitamin A; Vitamin C, Zinc - Please add a multivitamin with 100% of these o Increase protein intake. o Activity as tolerated Wound #2 Left Toe Great o Stop Smoking o Vitamin A; Vitamin C, Zinc - Please add a multivitamin with 100% of these o Increase protein intake. o Activity as tolerated Patient Medications Allergies: bee venom protein (honey bee), penicillin, oyster shell Notifications Medication Indication Start End lidocaine DOSE 1 - topical 4 % cream - 1 cream topical Electronic Signature(s) Signed: 05/28/2018 5:20:44 PM By: Alric Quan Signed: 05/29/2018 8:38:06 AM By: Worthy Keeler PA-C Entered By: Alric Quan on 05/28/2018 08:52:19 [name], [name] ([id number]) -------------------------------------------------------------------------------- Prescription 05/28/2018 Patient Name: Marcus Loll T. Provider: Worthy Keeler PA-C Date of Birth: 05-Jun-1951 NPI#: [id number] Sex: [name] DEA#: [id number] Phone #: [phone] License #: Patient Address: [place], [place] [address] [address] [place], [place]  [address] [phone] Allergies bee venom protein (honey bee) penicillin oyster shell Medication Medication: Route: Strength: Form: lidocaine 4 % topical cream topical 4% cream Class: TOPICAL LOCAL ANESTHETICS Dose: Frequency / Time: Indication: 1 1 cream topical Number of Refills: Number of Units: 0 Generic Substitution: Start Date: End Date: One Time Use: Substitution Permitted No Note to Pharmacy: Signature(s): Date(s): Electronic Signature(s) Signed: 05/28/2018 5:20:44 PM By: Alric Quan Signed: 05/29/2018 8:38:06 AM By: Ralene Bathe ([id number]) Entered By: Alric Quan on 05/28/2018 08:52:19 [name], [name] ([id number]) --------------------------------------------------------------------------------  Problem List Details Patient Name: Marcus Loll T. Date of Service: 05/28/2018 8:00 AM Medical Record Number: [id number] Patient Account Number: [id number] Date of Birth/Sex: 17-[name]-1952 (67 y.o. M) Treating RN: Ahmed Prima Primary Care Provider: BESS, KATY Other Clinician: Referring Provider: BESS, KATY Treating Provider/Extender: Melburn Hake, Elfrieda Espino Weeks in Treatment: 5 Active Problems ICD-10 Impacting Encounter Code Description Active Date Wound Healing Diagnosis I87.2 Venous insufficiency (chronic) (peripheral) 04/19/2018 No Yes S81.812A Laceration without foreign body, left lower leg, initial 04/19/2018 No Yes encounter L97.822 Non-pressure chronic ulcer of other part of left lower leg with 04/19/2018 No Yes fat layer exposed L97.521 Non-pressure chronic ulcer of other part of left foot limited to 04/19/2018 No Yes breakdown of skin E11.621 Type 2 diabetes mellitus with foot ulcer 04/19/2018 No Yes I63.9 Cerebral infarction, unspecified 04/19/2018 No Yes I10 Essential (primary) hypertension 04/19/2018 No Yes Inactive Problems Resolved Problems Electronic Signature(s) Signed: 05/29/2018 8:38:06 AM By: Worthy Keeler PA-C Entered  By: Worthy Keeler on 05/28/2018 08:09:45 [name], [name] ([id number]) -------------------------------------------------------------------------------- Progress Note Details Patient Name: Marcus Loll T. Date of Service: 05/28/2018 8:00 AM Medical Record Number: [id number] Patient Account Number: [id number] Date of Birth/Sex: 1951/11/06 (67 y.o. M) Treating RN: Ahmed Prima Primary Care Provider: BESS, KATY Other Clinician: Referring Provider: BESS, KATY Treating Provider/Extender: STONE III, Sameen Leas Weeks in Treatment: 5 Subjective Chief Complaint Information obtained from Patient Left lower leg and left great  toe ulcer History of Present Illness (HPI) The following HPI elements were documented for the patient's wound: Associated Signs and Symptoms: Patient has a history of chronic venous insufficiency, diabetes mellitus type II, its reform fraction, hypertension 04/19/18 on evaluation today patient presents initially concerning an ulcer of his left great toe and left anterior shin. He states that the shin was definitely a trauma injury where he struck this on a metal pole injuring leg. In regard to the distal left great toe he's really not sure what happened here although he does have a significant callous noted as well. He does have a pacemaker. Fortunately his ABI was 0.98 on the right and 0.83 on the left this appears to be doing fairly well. Overall I'm pleased with that. Nonetheless he states that this really has not seem to be improving. He has been tolerating the dressing changes without complication. The left anterior leg ulcer began on 04/05/18 according to what he tells [place] today although one note I did have for review said it was April 5. I'm inclined to believe it was the fifth she did have an office visit on 04/01/18 with his family nurse practitioner Faith Rogue. Nonetheless either way it's been going on this month for several weeks. The left great toe has actually been  present since January 1 he does not know how this occurred. Fortunately has no pain at the site although he does have pain on the left anterior shin. His hemoglobin A1c is 8.0 in March 2019. 04/26/18 on evaluation today patient presents for follow-up concerning his life into your [place] also as well as his left great toe also. Fortunately the shin actually appears to be doing better on evaluation today in my opinion I'm not seeing as much in the way of fluffernutter on the surface although it does still require debridement today this is improved. Size wise it's really not much different. With that being said the total ulcer actually appears to be a little bit more macerated today I'm not sure why they state that several days ago when this was changed that was not the case. Fortunately is not having severe pain although both areas do hurt the shin is worse. 05/03/18 on evaluation today patient appears to be doing better in regard to his left first toe and left shin ulcers. He is been tolerating the dressing changes without complication and the good news is this seems to be showing signs of getting better. Overall I'm very pleased with the progress that has been made over the past week. Patient is still having pain especially in regard to the left shin. 05/10/18 on evaluation today patient appears to be doing rather well in regard to his two ulcers. The toe ulcer appears to be smaller he does have some undermining however in this had to be cleaned out just a little bit. With that being said overall I feel like this is showing signs of improvement. The left anterior shin ulcer also showing signs of improvement at this time. There does not appear to be any evidence of infection which is good news. 05/21/18 on evaluation today patient appears to be doing very well in regard to his left shin ulcer. He has been tolerating the dressing changes without complication. With that being said he is having issues  at this point with his great toe on the left. We have not x-rayed that at this point I think we may need to. Nonetheless he has pain really at the roughly  5 o'clock location but nowhere else and I really cannot explain this I do not see any obvious form body. Nonetheless he continues to have issues with getting this area to close it's not progressing as nicely. 05/28/18 on evaluation today patient appears to be doing very well in regard to his left lower extremity anterior ulcer. He has been tolerating the dressing changes without complication. Overall this is making excellent progress with the collagen dressing. His left great toe x-ray did return and showed that he had no if you that normality. He did have a plantar heel spur Porath, Osvaldo T. ([id number]) but this is more degenerative. Otherwise just arthritis was noted no osteomyelitis and no obvious form body was visualized. Overall things seem to be going fairly well. With that being said he still has pain when looking at the toe from the plantar aspect at roughly the six back to the 4 o'clock location which is not quite as bad as last time but still hurts more than any other part of his wound. It does appear the Iodoflex has been of benefit however. Patient History Information obtained from Patient. Family History Cancer - Father, Diabetes - Father, Heart Disease - Mother,Father, Hypertension - Mother, Kidney Disease - Child, No family history of Lung Disease, Seizures, Stroke, Thyroid Problems, Tuberculosis. Social History Current every day smoker - 1 - 1/2 pack daily, Marital Status - Married, Alcohol Use - Rarely, Drug Use - No History, Caffeine Use - Daily - coffee. Review of Systems (ROS) Constitutional Symptoms (General Health) Denies complaints or symptoms of Fever, Chills. Respiratory The patient has no complaints or symptoms. Cardiovascular Complains or has symptoms of LE edema. Psychiatric The patient has no complaints or  symptoms. Objective Constitutional Well-nourished and well-hydrated in no acute distress. Vitals Time Taken: 8:14 AM, Height: 69 in, Weight: 238 lbs, BMI: 35.1, Pulse: 72 bpm, Respiratory Rate: 18 breaths/min, Blood Pressure: 132/68 mmHg. Respiratory normal breathing without difficulty. Psychiatric this patient is able to make decisions and demonstrates good insight into disease process. Alert and Oriented x 3. pleasant and cooperative. General Notes: Patient's left anterior lower extremity ulcer show signs of excellent epithelialization and in fact this is at least half the size that it was last week. With that being said the left great toe ulcer does appear to be cleaning up more nicely. Although there still a lot of slough noted in the base of the wound. It did require sharp debridement today and I did specifically debride in the area of slight undermining at the four-6 o'clock location. It did appear that the charrette was snagging somewhat on something that eventually I was able to debride away and it appeared to be somewhat shiny almost like it might've been a small piece of glass. With that being said the patient also states that he has been working with cacti and that it could've been something from that. Obviously I wasn't able to fully identify as it was wrapped up in other tissue and I could Chain, Meshach T. ([id number]) not separate it necessarily. Nonetheless after debridement he states that he's no longer having the pain that he was having prior it does appear that something was embedded at the site. Integumentary (Hair, Skin) Wound #1 status is Open. Original cause of wound was Trauma. The wound is located on the Left,Midline,Anterior Lower Leg. The wound measures 1.8cm length x 1.3cm width x 0.1cm depth; 1.838cm^2 area and 0.184cm^3 volume. There is Fat Layer (Subcutaneous Tissue) Exposed exposed. There is no tunneling  or undermining noted. There is a medium amount of serous  drainage noted. The wound margin is distinct with the outline attached to the wound base. There is large (67-100%) red, hyper - granulation within the wound bed. There is a small (1-33%) amount of necrotic tissue within the wound bed including Adherent Slough. The periwound skin appearance exhibited: Hemosiderin Staining. The periwound skin appearance did not exhibit: Callus, Crepitus, Excoriation, Induration, Rash, Scarring, Dry/Scaly, Maceration, Atrophie Blanche, Cyanosis, Ecchymosis, Mottled, Pallor, Rubor, Erythema. Periwound temperature was noted as No Abnormality. Wound #2 status is Open. Original cause of wound was Trauma. The wound is located on the Left Toe Great. The wound measures 0.4cm length x 0.4cm width x 0.4cm depth; 0.126cm^2 area and 0.05cm^3 volume. There is no tunneling or undermining noted. There is a medium amount of serous drainage noted. The wound margin is flat and intact. There is small (1-33%) pink granulation within the wound bed. There is a large (67-100%) amount of necrotic tissue within the wound bed including Adherent Slough. The periwound skin appearance exhibited: Callus. The periwound skin appearance did not exhibit: Crepitus, Excoriation, Induration, Rash, Scarring, Dry/Scaly, Maceration, Atrophie Blanche, Cyanosis, Ecchymosis, Hemosiderin Staining, Mottled, Pallor, Rubor, Erythema. 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 There was a Excisional Skin/Subcutaneous Tissue Debridement with a total area of 0.16 sq cm performed by STONE III, Evelene Roussin E., PA-C. With the following instrument(s): Curette to remove Viable and Non-Viable tissue/material. Material removed includes Callus, Subcutaneous Tissue, and Slough after achieving pain control using Lidocaine 4% Topical Solution. No specimens were taken. A time out was conducted at 08:41, prior to the start of the procedure. A Minimum amount of bleeding was controlled with Pressure. The procedure was tolerated well with a pain level of 0 throughout and a pain level of 0 following the procedure. Patient s Level of Consciousness post procedure was recorded as Awake and Alert. Post Debridement Measurements: 0.5cm length x 0.5cm width x 0.4cm depth; 0.079cm^3 volume. Character of Wound/Ulcer Post Debridement requires further debridement. Severity of Tissue Post Debridement is: Fat layer exposed. Post procedure Diagnosis Wound #2: Same as Pre-Procedure [name], [name] ([id number]) Plan Wound Cleansing: Wound #1 Left,Midline,Anterior Lower Leg: Cleanse wound with mild soap and water Wound #2 Left Toe Great: Cleanse wound with mild soap and water May Shower, gently pat wound dry prior to applying new dressing. Anesthetic (add to Medication List): Wound #1 Left,Midline,Anterior Lower Leg: Topical Lidocaine 4% cream applied to wound bed prior to debridement (In Clinic Only). Wound #2 Left Toe Great: Topical Lidocaine 4% cream applied to wound bed prior to debridement (In Clinic Only). Primary Wound Dressing: Wound #1 Left,Midline,Anterior Lower Leg: Hydrogel Collagen Wound #2 Left Toe Great: Iodoflex Secondary Dressing: Wound #1 Left,Midline,Anterior Lower Leg: ABD pad Non-adherent pad Wound #2 Left Toe Great: Gauze and Kerlix/Conform - secure with coban Foam Dressing Change Frequency: Wound #1 Left,Midline,Anterior Lower  Leg: Change dressing every week Wound #2 Left Toe Great: Change dressing every other day. Follow-up Appointments: Wound #1 Left,Midline,Anterior Lower Leg:  Return Appointment in 1 week. Nurse Visit as needed Wound #2 Left Toe Great: Return Appointment in 1 week. Nurse Visit as needed Edema Control: Wound #1 Left,Midline,Anterior Lower Leg: 3 Layer Compression System - Left Lower Extremity - unna to anchor Patient to wear own compression stockings - right lower leg Elevate legs to the level of the heart and pump ankles as often as possible Additional Orders / Instructions: Wound #1 Left,Midline,Anterior Lower Leg: Stop Smoking Vitamin A; Vitamin C, Zinc - Please add a multivitamin with 100% of these Increase protein intake. Activity as tolerated Wound #2 Left Toe Great: Stop Smoking Vitamin A; Vitamin C, Zinc - Please add a multivitamin with 100% of these Increase protein intake. [name], GARSKE ([id number]) Activity as tolerated The following medication(s) was prescribed: lidocaine topical 4 % cream 1 1 cream topical was prescribed at facility At this point now that the patient's wound actually appears to be doing better and he's not showing any signs of infection I'm hoping that the pain will continue to stay away in regard to the left first toe we will subsequently see were things stand in one weeks time. He is in agreement with the plan. If anything changes in the interim he let me know otherwise I'll look forward to seeing at that point hopefully things will be doing much better. Please see above for specific wound care orders. We will see patient for re-evaluation in 1 week(s) here in the clinic. If anything worsens or changes patient will contact our office for additional recommendations. Electronic Signature(s) Signed: 05/29/2018 8:38:06 AM By: Worthy Keeler PA-C Entered By: Worthy Keeler on 05/28/2018 09:11:16 [name], [name]  ([id number]) -------------------------------------------------------------------------------- ROS/PFSH Details Patient Name: [name]. Date of Service: 05/28/2018 8:00 AM Medical Record Number: [id number] Patient Account Number: [id number] Date of Birth/Sex: 1951-01-31 (67 y.o. M) Treating RN: Ahmed Prima Primary Care Provider: BESS, KATY Other Clinician: Referring Provider: BESS, KATY Treating Provider/Extender: STONE III, Taysen Bushart Weeks in Treatment: 5 Information Obtained From Patient Wound History Do you currently have one or more open woundso Yes How many open wounds do you currently haveo 2 Approximately how long have you had your woundso 2 weeks How have you been treating your wound(s) until nowo yes Doctor Has your wound(s) ever healed and then re-openedo No Have you had any lab work done in the past montho No Have you tested positive for an antibiotic resistant organism (MRSA, VRE)o No Have you tested positive for osteomyelitis (bone infection)o No Have you had any tests for circulation on your legso No Have you had other problems associated with your woundso Infection, Swelling Constitutional Symptoms (General Health) Complaints and Symptoms: Negative for: Fever; Chills Cardiovascular Complaints and Symptoms: Positive for: LE edema Medical History: Positive for: Arrhythmia - pacemaker; Coronary Artery Disease; Hypertension; Myocardial Infarction Negative for: Deep Vein Thrombosis; Hypotension; Peripheral Arterial Disease; Peripheral Venous Disease; Phlebitis; Vasculitis Eyes Medical History: Negative for: Cataracts; Glaucoma; Optic Neuritis Ear/Nose/Mouth/Throat Medical History: Negative for: Chronic sinus problems/congestion Hematologic/Lymphatic Medical History: Positive for: Lymphedema Negative for: Anemia; Hemophilia; Human Immunodeficiency Virus; Sickle Cell Disease Respiratory Complaints and Symptoms: No Complaints or Symptoms Hommel, [name]  ([id number]) Medical History: Negative for: Aspiration; Asthma; Chronic Obstructive Pulmonary Disease (COPD); Pneumothorax; Sleep Apnea; Tuberculosis Gastrointestinal Medical History: Negative for: Cirrhosis ; Colitis; Crohnos; Hepatitis A; Hepatitis B; Hepatitis C Endocrine Medical History: Positive for: Type II Diabetes Negative for: Type I Diabetes Time with diabetes: 5 years Treated with: Oral agents Blood sugar tested every day:  Yes Tested : daily AM Blood sugar testing results: Breakfast: 174 Genitourinary Medical History: Negative for: End Stage Renal Disease Immunological Medical History: Negative for: Lupus Erythematosus; Raynaudos; Scleroderma Integumentary (Skin) Medical History: Negative for: History of Burn; History of pressure wounds Musculoskeletal Medical History: Negative for: Gout; Rheumatoid Arthritis; Osteoarthritis; Osteomyelitis Neurologic Medical History: Positive for: Neuropathy Negative for: Dementia; Quadriplegia; Paraplegia; Seizure Disorder Oncologic Medical History: Positive for: Received Chemotherapy; Received Radiation - Seeds prostate Psychiatric Complaints and Symptoms: No Complaints or Symptoms Medical History: Negative for: Anorexia/bulimia; Confinement Anxiety Rowlands, KEILON RESSEL ([id number]) Immunizations Pneumococcal Vaccine: Received Pneumococcal Vaccination: Yes Tetanus Vaccine: Last tetanus shot: 12/25/2017 Implantable Devices Family and Social History Cancer: Yes - Father; Diabetes: Yes - Father; Heart Disease: Yes - Mother,Father; Hypertension: Yes - Mother; Kidney Disease: Yes - Child; Lung Disease: No; Seizures: No; Stroke: No; Thyroid Problems: No; Tuberculosis: No; Current every day smoker - 1 - 1/2 pack daily; Marital Status - Married; Alcohol Use: Rarely; Drug Use: No History; Caffeine Use: Daily - coffee; Financial Concerns: No; Food, Clothing or Shelter Needs: No; Support System Lacking: No; Transportation Concerns:  No Physician Affirmation I have reviewed and agree with the above information. Electronic Signature(s) Signed: 05/28/2018 5:20:44 PM By: Alric Quan Signed: 05/29/2018 8:38:06 AM By: Worthy Keeler PA-C Entered By: Worthy Keeler on 05/28/2018 09:08:48 Claus, [name] ([id number]) -------------------------------------------------------------------------------- SuperBill Details Patient Name: [name]. Date of Service: 05/28/2018 Medical Record Number: [id number] Patient Account Number: [id number] Date of Birth/Sex: 1951/03/28 (67 y.o. M) Treating RN: Ahmed Prima Primary Care Provider: BESS, KATY Other Clinician: Referring Provider: BESS, KATY Treating Provider/Extender: STONE III, Garnell Phenix Weeks in Treatment: 5 Diagnosis Coding ICD-10 Codes Code Description I87.2 Venous insufficiency (chronic) (peripheral) S81.812A Laceration without foreign body, left lower leg, initial encounter L97.822 Non-pressure chronic ulcer of other part of left lower leg with fat layer exposed L97.521 Non-pressure chronic ulcer of other part of left foot limited to breakdown of skin E11.621 Type 2 diabetes mellitus with foot ulcer I63.9 Cerebral infarction, unspecified I10 Essential (primary) hypertension Facility Procedures CPT4 Code Description: [id number] 11042 - DEB SUBQ TISSUE 20 SQ CM/< ICD-10 Diagnosis Description L97.521 Non-pressure chronic ulcer of other part of left foot limited t Modifier: o breakdown of s Quantity: 1 kin Physician Procedures CPT4 Code Description: [id number] - WC PHYS SUBQ TISS 20 SQ CM ICD-10 Diagnosis Description L97.521 Non-pressure chronic ulcer of other part of left foot limited t Modifier: o breakdown of sk Quantity: 1 in Electronic Signature(s) Signed: 05/29/2018 8:38:06 AM By: Worthy Keeler PA-C Entered By: Worthy Keeler on 05/28/2018 09:11:31

## 2018-06-04 ENCOUNTER — Other Ambulatory Visit
Admission: RE | Admit: 2018-06-04 | Discharge: 2018-06-04 | Disposition: A | Discharge status: Home / Self Care | Payer: PPO | Source: Ambulatory Visit | Attending: Physician Assistant | Admitting: Physician Assistant

## 2018-06-04 ENCOUNTER — Encounter: Payer: PPO | Admitting: Physician Assistant

## 2018-06-04 ENCOUNTER — Other Ambulatory Visit: Payer: Self-pay | Admitting: Physician Assistant

## 2018-06-04 DIAGNOSIS — E11621 Type 2 diabetes mellitus with foot ulcer: Secondary | ICD-10-CM | POA: Diagnosis not present

## 2018-06-04 DIAGNOSIS — B999 Unspecified infectious disease: Secondary | ICD-10-CM | POA: Insufficient documentation

## 2018-06-04 DIAGNOSIS — L97822 Non-pressure chronic ulcer of other part of left lower leg with fat layer exposed: Secondary | ICD-10-CM | POA: Diagnosis not present

## 2018-06-04 DIAGNOSIS — I872 Venous insufficiency (chronic) (peripheral): Secondary | ICD-10-CM | POA: Diagnosis not present

## 2018-06-04 DIAGNOSIS — L97522 Non-pressure chronic ulcer of other part of left foot with fat layer exposed: Secondary | ICD-10-CM | POA: Diagnosis not present

## 2018-06-04 DIAGNOSIS — L97521 Non-pressure chronic ulcer of other part of left foot limited to breakdown of skin: Secondary | ICD-10-CM

## 2018-06-04 DIAGNOSIS — E11622 Type 2 diabetes mellitus with other skin ulcer: Secondary | ICD-10-CM | POA: Diagnosis not present

## 2018-06-07 LAB — AEROBIC CULTURE  (SUPERFICIAL SPECIMEN)

## 2018-06-07 LAB — AEROBIC CULTURE W GRAM STAIN (SUPERFICIAL SPECIMEN): Culture: NORMAL

## 2018-06-07 NOTE — Progress Notes (Signed)
[name], [name] ([id number]) Visit Report for 06/04/2018 Chief Complaint Document Details Patient Name: [name], [name]. Date of Service: 06/04/2018 8:00 AM Medical Record Number: [id number] Patient Account Number: [id number] Date of Birth/Sex: 1951-01-23 (67 y.o. M) Treating RN: Ahmed Prima Primary Care Provider: BESS, Valetta Fuller Other Clinician: Referring Provider: BESS, KATY Treating Provider/Extender: STONE III, HOYT Weeks in Treatment: 6 Information Obtained from: Patient Chief Complaint Left lower leg and left great toe ulcer Electronic Signature(s) Signed: 06/04/2018 5:32:46 PM By: Worthy Keeler PA-C Entered By: Worthy Keeler on 06/04/2018 08:15:30 [name] ([id number]) -------------------------------------------------------------------------------- Debridement Details Patient Name: [name]. Date of Service: 06/04/2018 8:00 AM Medical Record Number: [id number] Patient Account Number: [id number] Date of Birth/Sex: 1951-04-11 (67 y.o. M) Treating RN: Ahmed Prima Primary Care Provider: BESS, KATY Other Clinician: Referring Provider: BESS, KATY Treating Provider/Extender: STONE III, HOYT Weeks in Treatment: 6 Debridement Performed for Wound #2 Left Toe Great Assessment: Performed By: Physician STONE III, HOYT E., PA-C Debridement Type: Debridement Severity of Tissue Pre Fat layer exposed Debridement: Pre-procedure Verification/Time Yes - 08:33 Out Taken: Start Time: 08:36 Pain Control: Lidocaine 4% Topical Solution Total Area Debrided (L x W): 0.7 (cm) x 0.7 (cm) = 0.49 (cm) Tissue and other material Viable, Non-Viable, Callus, Slough, Subcutaneous, Fibrin/Exudate, Slough debrided: Level: Skin/Subcutaneous Tissue Debridement Description: Excisional Instrument: Curette, Forceps, Scissors Specimen: Swab, Number of Specimens Taken: 1 Bleeding: Minimum Hemostasis Achieved: Pressure End Time: 08:44 Procedural Pain: 0 Post Procedural Pain:  0 Response to Treatment: Procedure was tolerated well Level of Consciousness: Awake and Alert Post Procedure Vitals: Temperature: 97.8 Pulse: 76 Respiratory Rate: 18 Blood Pressure: Systolic Blood Pressure: 154 Diastolic Blood Pressure: 64 Post Debridement Measurements of Total Wound Length: (cm) 0.8 Width: (cm) 0.8 Depth: (cm) 0.4 Volume: (cm) 0.201 Character of Wound/Ulcer Post Debridement: Requires Further Debridement Severity of Tissue Post Debridement: Fat layer exposed Post Procedure Diagnosis Same as Pre-procedure Electronic Signature(s) KAMARIAN, SAHAKIAN ([id number]) Signed: 06/04/2018 4:32:24 PM By: Alric Quan Signed: 06/04/2018 5:32:46 PM By: Worthy Keeler PA-C Entered By: Alric Quan on 06/04/2018 08:46:46 [name] ([id number]) -------------------------------------------------------------------------------- Debridement Details Patient Name: Marcus Loll T. Date of Service: 06/04/2018 8:00 AM Medical Record Number: [id number] Patient Account Number: [id number] Date of Birth/Sex: [birth year] (67 y.o. M) Treating RN: Ahmed Prima Primary Care Provider: BESS, KATY Other Clinician: Referring Provider: BESS, KATY Treating Provider/Extender: STONE III, HOYT Weeks in Treatment: 6 Debridement Performed for Wound #1 Left,Midline,Anterior Lower Leg Assessment: Performed By: Physician STONE III, HOYT E., PA-C Debridement Type: Debridement Severity of Tissue Pre Fat layer exposed Debridement: Pre-procedure Verification/Time Yes - 08:33 Out Taken: Start Time: 08:33 Pain Control: Lidocaine 4% Topical Solution Total Area Debrided (L x W): 1 (cm) x 1.2 (cm) = 1.2 (cm) Tissue and other material Viable, Non-Viable, Slough, Subcutaneous, Fibrin/Exudate, Slough debrided: Level: Skin/Subcutaneous Tissue Debridement Description: Excisional Instrument: Curette Bleeding: Minimum Hemostasis Achieved: Pressure End Time: 08:35 Procedural Pain: 0 Post  Procedural Pain: 0 Response to Treatment: Procedure was tolerated well Level of Consciousness: Awake and Alert Post Procedure Vitals: Temperature: 97.8 Pulse: 76 Respiratory Rate: 18 Blood Pressure: Systolic Blood Pressure: 250 Diastolic Blood Pressure: 64 Post Debridement Measurements of Total Wound Length: (cm) 1 Width: (cm) 1.2 Depth: (cm) 0.2 Volume: (cm) 0.188 Character of Wound/Ulcer Post Debridement: Requires Further Debridement Severity of Tissue Post Debridement: Fat layer exposed Post Procedure Diagnosis Same as Pre-procedure Electronic Signature(s) Signed: 06/04/2018 4:32:24 PM By: Alric Quan Signed: 06/04/2018 5:32:46 PM By: Worthy Keeler PA-C [name], [name] ([id number]) Entered By: Alric Quan on 06/04/2018 08:46:55 [name], [name] ([id number]) -------------------------------------------------------------------------------- HPI Details Patient Name: Marcus Loll T. Date of Service: 06/04/2018 8:00 AM Medical Record Number: [id number] Patient Account Number: [id number] Date of Birth/Sex: 07/09/1951 (67 y.o. M) Treating RN: Ahmed Prima Primary Care Provider: BESS, KATY Other Clinician: Referring Provider: BESS, KATY Treating Provider/Extender: STONE III, HOYT Weeks in Treatment: 6 History of Present Illness Associated Signs and Symptoms: Patient has a history of chronic venous insufficiency, diabetes mellitus type II, its reform fraction, hypertension HPI Description: 04/19/18 on evaluation today patient presents initially concerning an ulcer of his left great toe and left anterior shin. He states that the shin was definitely a trauma injury where he struck this on a metal pole injuring leg. In regard to the distal left great toe he's really not sure what happened here although he does have a significant callous noted as well. He does have a pacemaker. Fortunately his ABI was 0.98 on the right and 0.83 on the left this appears to be doing fairly  well. Overall I'm pleased with that. Nonetheless he states that this really has not seem to be improving. He has been tolerating the dressing changes without complication. The left anterior leg ulcer began on 04/05/18 according to what he tells [place] today although one note I did have for review said it was April 5. I'm inclined to believe it was the fifth she did have an office visit on 04/01/18 with his family nurse practitioner Faith Rogue. Nonetheless either way it's been going on this month for several weeks. The left great toe has actually been present since January 1 he does not know how this occurred. Fortunately has no pain at the site although he does have pain on the left anterior shin. His hemoglobin A1c is 8.0 in March 2019. 04/26/18 on evaluation today patient presents for follow-up concerning his life into your lower [place] also as well as his left great toe also. Fortunately the shin actually appears to be doing better on evaluation today in my opinion I'm not seeing as much in the way of fluffernutter on the surface although it does still require debridement today this is improved. Size wise it's really not much different. With that being said the total ulcer actually appears to be a little bit more macerated today I'm not sure why they state that several days ago when this was changed that was not the case. Fortunately is not having severe pain although both areas do hurt the shin is worse. 05/03/18 on evaluation today patient appears to be doing better in regard to his left first toe and left shin ulcers. He is been tolerating the dressing changes without complication and the good news is this seems to be showing signs of getting better. Overall I'm very pleased with the progress that has been made over the past week. Patient is still having pain especially in regard to the left shin. 05/10/18 on evaluation today patient appears to be doing rather well in regard to his two ulcers. The  toe ulcer appears to be smaller he does have some undermining however in this had to be cleaned out just a little bit. With that being said overall I feel like this is showing signs of improvement. The left anterior shin ulcer also showing signs of improvement at this time. There does not appear to be any evidence of infection which is good news. 05/21/18 on evaluation today patient appears to be doing very well in  regard to his left shin ulcer. He has been tolerating the dressing changes without complication. With that being said he is having issues at this point with his great toe on the left. We have not x-rayed that at this point I think we may need to. Nonetheless he has pain really at the roughly 5 o'clock location but nowhere else and I really cannot explain this I do not see any obvious form body. Nonetheless he continues to have issues with getting this area to close it's not progressing as nicely. 05/28/18 on evaluation today patient appears to be doing very well in regard to his left lower extremity anterior ulcer. He has been tolerating the dressing changes without complication. Overall this is making excellent progress with the collagen dressing. His left great toe x-ray did return and showed that he had no if you that normality. He did have a plantar heel spur but this is more degenerative. Otherwise just arthritis was noted no osteomyelitis and no obvious form body was visualized. Overall things seem to be going fairly well. With that being said he still has pain when looking at the toe from the plantar aspect at roughly the six back to the 4 o'clock location which is not quite as bad as last time but still hurts more than any other part of his wound. It does appear the Iodoflex has been of benefit however. 06/04/18 on evaluation today patient appears to be doing excellent in regard to his left anterior shin ulcer. This has made excellent progress and is showing signs of improving in fact  very close to completely closing in healing. Nonetheless unfortunately his left great toe has not faired quite as well as far as progress is concerned. He notes that after last week's Morrical, [name]. ([id number]) debridement he actually seem to be doing a little bit better for a couple of days until Thursday of last week when he had increases in discomfort as well is changes in the draining. He states that the drainage has been more green in color according to his wife and what he has seen. He's also had more discomfort. He relates to me again that he really feels like this all started when he stepped on a nail although he does not exactly remember it it seemed to be a puncture wound that he feels may be the culprit for why all this started. Nonetheless I am getting more concerned about the possibility of osteomyelitis despite the fact that the x-ray was negative I was hopeful in this interim since last week to this week that we would see some improvement in the overall appearance of the toe. Unfortunately if anything I feel like it's a little worse. It was macerated but I think we can attribute this to the fact that the patient did have an episode last night we got stuck in the rain and he thinks the dressing got wet. He did not change it I told him in the future if this were to happen to please go ahead and change the dressing it won't hurt to change it more frequently in that situation. Otherwise other than the maceration I do not see any evidence of anything being any worse but it also does not appear to be any better. Electronic Signature(s) Signed: 06/04/2018 5:32:46 PM By: Worthy Keeler PA-C Entered By: Worthy Keeler on 06/04/2018 10:21:04 Barkdull, [name] ([id number]) -------------------------------------------------------------------------------- Physical Exam Details Patient Name: Marcus Loll T. Date of Service: 06/04/2018 8:00 AM Medical Record  Number: [id number] Patient  Account Number: [id number] Date of Birth/Sex: 02/12/1951 (67 y.o. M) Treating RN: Ahmed Prima Primary Care Provider: BESS, KATY Other Clinician: Referring Provider: BESS, KATY Treating Provider/Extender: STONE III, HOYT Weeks in Treatment: 6 Constitutional Well-nourished and well-hydrated in no acute distress. Respiratory normal breathing without difficulty. Psychiatric this patient is able to make decisions and demonstrates good insight into disease process. Alert and Oriented x 3. pleasant and cooperative. Notes Currently on inspection patient still has slough noted in the base of the wound bed. I cannot directly probe bone but I am getting closer to the surface of the bone which is being able to probe this area. I did actually obtain a wound culture today in order to evaluate for the possibility of infection from the deep portions of the wound. Nonetheless I did also perform sharp debridement today in order to try to clean away some of the slough. I was able to do so still there was no bone directly exposed although we are very close to that point in my fear is honestly that he may have osteomyelitis that may be the reason that the wound is not progressing as it really should be at this point. I explained this to the patient as well. Electronic Signature(s) Signed: 06/04/2018 5:32:46 PM By: Worthy Keeler PA-C Entered By: Worthy Keeler on 06/04/2018 10:22:11 Shahan, [name] ([id number]) -------------------------------------------------------------------------------- Physician Orders Details Patient Name: [name]. Date of Service: 06/04/2018 8:00 AM Medical Record Number: [id number] Patient Account Number: [id number] Date of Birth/Sex: Aug 04, 1951 (67 y.o. M) Treating RN: Ahmed Prima Primary Care Provider: BESS, KATY Other Clinician: Referring Provider: BESS, KATY Treating Provider/Extender: STONE III, HOYT Weeks in Treatment: 6 Verbal / Phone Orders:  Yes Clinician: Carolyne Fiscal, Debi Read Back and Verified: Yes Diagnosis Coding ICD-10 Coding Code Description I87.2 Venous insufficiency (chronic) (peripheral) B34.193X Laceration without foreign body, left lower leg, initial encounter L97.822 Non-pressure chronic ulcer of other part of left lower leg with fat layer exposed L97.521 Non-pressure chronic ulcer of other part of left foot limited to breakdown of skin E11.621 Type 2 diabetes mellitus with foot ulcer I63.9 Cerebral infarction, unspecified I10 Essential (primary) hypertension Wound Cleansing Wound #1 Left,Midline,Anterior Lower Leg o Cleanse wound with mild soap and water Wound #2 Left Toe Great o Cleanse wound with mild soap and water o May Shower, gently pat wound dry prior to applying new dressing. Anesthetic (add to Medication List) Wound #1 Left,Midline,Anterior Lower Leg o Topical Lidocaine 4% cream applied to wound bed prior to debridement (In Clinic Only). Wound #2 Left Toe Great o Topical Lidocaine 4% cream applied to wound bed prior to debridement (In Clinic Only). Primary Wound Dressing Wound #1 Left,Midline,Anterior Lower Leg o Hydrogel o Collagen Wound #2 Left Toe Great o Silver Alginate Secondary Dressing Wound #1 Left,Midline,Anterior Lower Leg o ABD pad o Non-adherent pad Wound #2 Left Toe Great o Gauze and Kerlix/Conform - secure with coban o Foam Hentz, [name] ([id number]) Dressing Change Frequency Wound #1 Left,Midline,Anterior Lower Leg o Change dressing every week Wound #2 Left Toe Great o Change dressing every other day. Follow-up Appointments Wound #1 Left,Midline,Anterior Lower Leg o Return Appointment in 1 week. o Nurse Visit as needed Wound #2 Left Toe Great o Return Appointment in 1 week. o Nurse Visit as needed Edema Control Wound #1 Left,Midline,Anterior Lower Leg o 3 Layer Compression System - Left Lower Extremity - unna to anchor o  Patient to wear own compression stockings - right lower leg   o Elevate legs to the level of the heart and pump ankles as often as possible Additional Orders / Instructions Wound #1 Left,Midline,Anterior Lower Leg o Stop Smoking o Vitamin A; Vitamin C, Zinc - Please add a multivitamin with 100% of these o Increase protein intake. o Activity as tolerated Wound #2 Left Toe Great o Stop Smoking o Vitamin A; Vitamin C, Zinc - Please add a multivitamin with 100% of these o Increase protein intake. o Activity as tolerated Laboratory o Bacteria identified in Wound by Culture (MICRO) - left toe great oooo LOINC Code: 3614-4 oooo Convenience Name: Wound culture routine Radiology o Magnetic Resonance Imaging (MRI) - left toe great Patient Medications Allergies: bee venom protein (honey bee), penicillin, oyster shell Notifications Medication Indication Start End lidocaine DOSE 1 - topical 4 % cream - 1 cream topical Bactrim DS 06/04/2018 DOSE 1 - oral 800 mg-160 mg tablet - 1 tablet oral taken 2 times a day for 10 days JARELL, MCEWEN ([id number]) Electronic Signature(s) Signed: 06/04/2018 10:24:56 AM By: Worthy Keeler PA-C Entered By: Worthy Keeler on 06/04/2018 10:24:55 Ruffner, [name] ([id number]) -------------------------------------------------------------------------------- Prescription 06/04/2018 Patient Name: Marcus Loll T. Provider: Worthy Keeler PA-C Date of Birth: October 04, 1951 NPI#: [id number] Sex: [name] DEA#: [id number] Phone #: [phone] License #: Patient Address: [place], [place] [address] [address] [place], [place] [address] [id number] Allergies bee venom protein (honey bee) penicillin oyster shell Medication Medication: Route: Strength: Form: lidocaine 4 % topical cream topical 4% cream Class: TOPICAL LOCAL  ANESTHETICS Dose: Frequency / Time: Indication: 1 1 cream topical Number of Refills: Number of Units: 0 Generic Substitution: Start Date: End Date: One Time Use: Substitution Permitted No Note to Pharmacy: Signature(s): Date(s): Electronic Signature(s) Signed: 06/04/2018 5:32:46 PM By: Ralene Bathe ([id number]) Entered By: Worthy Keeler on 06/04/2018 10:24:57 Seoane, [name] ([id number]) --------------------------------------------------------------------------------  Problem List Details Patient Name: Marcus Loll T. Date of Service: 06/04/2018 8:00 AM Medical Record Number: [id number] Patient Account Number: [id number] Date of Birth/Sex: 14-Jul-1951 (67 y.o. M) Treating RN: Ahmed Prima Primary Care Provider: BESS, KATY Other Clinician: Referring Provider: BESS, KATY Treating Provider/Extender: Melburn Hake, HOYT Weeks in Treatment: 6 Active Problems ICD-10 Impacting Encounter Code Description Active Date Wound Healing Diagnosis I87.2 Venous insufficiency (chronic) (peripheral) 04/19/2018 No Yes S81.812A Laceration without foreign body, left lower leg, initial 04/19/2018 No Yes encounter L97.822 Non-pressure chronic ulcer of other part of left lower leg with 04/19/2018 No Yes fat layer exposed L97.521 Non-pressure chronic ulcer of other part of left foot limited to 04/19/2018 No Yes breakdown of skin E11.621 Type 2 diabetes mellitus with foot ulcer 04/19/2018 No Yes I63.9 Cerebral infarction, unspecified 04/19/2018 No Yes I10 Essential (primary) hypertension 04/19/2018 No Yes Inactive Problems Resolved Problems Electronic Signature(s) Signed: 06/04/2018 5:32:46 PM By: Worthy Keeler PA-C Entered By: Worthy Keeler on 06/04/2018 08:15:25 Mascio, [name] ([id number]) -------------------------------------------------------------------------------- Progress Note Details Patient Name: Marcus Loll T. Date of Service: 06/04/2018 8:00 AM Medical  Record Number: [id number] Patient Account Number: [id number] Date of Birth/Sex: 11-Apr-1951 (67 y.o. M) Treating RN: Ahmed Prima Primary Care Provider: BESS, KATY Other Clinician: Referring Provider: BESS, KATY Treating Provider/Extender: STONE III, HOYT Weeks in Treatment: 6 Subjective Chief Complaint Information obtained from Patient Left lower leg and left great toe ulcer History of Present Illness (HPI) The following HPI elements were documented for the patient's wound: Associated Signs  and Symptoms: Patient has a history of chronic venous insufficiency, diabetes mellitus type II, its reform fraction, hypertension 04/19/18 on evaluation today patient presents initially concerning an ulcer of his left great toe and left anterior shin. He states that the shin was definitely a trauma injury where he struck this on a metal pole injuring leg. In regard to the distal left great toe he's really not sure what happened here although he does have a significant callous noted as well. He does have a pacemaker. Fortunately his ABI was 0.98 on the right and 0.83 on the left this appears to be doing fairly well. Overall I'm pleased with that. Nonetheless he states that this really has not seem to be improving. He has been tolerating the dressing changes without complication. The left anterior leg ulcer began on 04/05/18 according to what he tells [place] today although one note I did have for review said it was April 5. I'm inclined to believe it was the fifth she did have an office visit on 04/01/18 with his family nurse practitioner Faith Rogue. Nonetheless either way it's been going on this month for several weeks. The left great toe has actually been present since January 1 he does not know how this occurred. Fortunately has no pain at the site although he does have pain on the left anterior shin. His hemoglobin A1c is 8.0 in March 2019. 04/26/18 on evaluation today patient presents for follow-up  concerning his life into your lower [place] also as well as his left great toe also. Fortunately the shin actually appears to be doing better on evaluation today in my opinion I'm not seeing as much in the way of fluffernutter on the surface although it does still require debridement today this is improved. Size wise it's really not much different. With that being said the total ulcer actually appears to be a little bit more macerated today I'm not sure why they state that several days ago when this was changed that was not the case. Fortunately is not having severe pain although both areas do hurt the shin is worse. 05/03/18 on evaluation today patient appears to be doing better in regard to his left first toe and left shin ulcers. He is been tolerating the dressing changes without complication and the good news is this seems to be showing signs of getting better. Overall I'm very pleased with the progress that has been made over the past week. Patient is still having pain especially in regard to the left shin. 05/10/18 on evaluation today patient appears to be doing rather well in regard to his two ulcers. The toe ulcer appears to be smaller he does have some undermining however in this had to be cleaned out just a little bit. With that being said overall I feel like this is showing signs of improvement. The left anterior shin ulcer also showing signs of improvement at this time. There does not appear to be any evidence of infection which is good news. 05/21/18 on evaluation today patient appears to be doing very well in regard to his left shin ulcer. He has been tolerating the dressing changes without complication. With that being said he is having issues at this point with his great toe on the left. We have not x-rayed that at this point I think we may need to. Nonetheless he has pain really at the roughly 5 o'clock location but nowhere else and I really cannot explain this I do not see any obvious  form  body. Nonetheless he continues to have issues with getting this area to close it's not progressing as nicely. 05/28/18 on evaluation today patient appears to be doing very well in regard to his left lower extremity anterior ulcer. He has been tolerating the dressing changes without complication. Overall this is making excellent progress with the collagen dressing. His left great toe x-ray did return and showed that he had no if you that normality. He did have a plantar heel spur [name], Marcus T. ([id number]) but this is more degenerative. Otherwise just arthritis was noted no osteomyelitis and no obvious form body was visualized. Overall things seem to be going fairly well. With that being said he still has pain when looking at the toe from the plantar aspect at roughly the six back to the 4 o'clock location which is not quite as bad as last time but still hurts more than any other part of his wound. It does appear the Iodoflex has been of benefit however. 06/04/18 on evaluation today patient appears to be doing excellent in regard to his left anterior shin ulcer. This has made excellent progress and is showing signs of improving in fact very close to completely closing in healing. Nonetheless unfortunately his left great toe has not faired quite as well as far as progress is concerned. He notes that after last week's debridement he actually seem to be doing a little bit better for a couple of days until Thursday of last week when he had increases in discomfort as well is changes in the draining. He states that the drainage has been more green in color according to his wife and what he has seen. He's also had more discomfort. He relates to me again that he really feels like this all started when he stepped on a nail although he does not exactly remember it it seemed to be a puncture wound that he feels may be the culprit for why all this started. Nonetheless I am getting more concerned about the  possibility of osteomyelitis despite the fact that the x-ray was negative I was hopeful in this interim since last week to this week that we would see some improvement in the overall appearance of the toe. Unfortunately if anything I feel like it's a little worse. It was macerated but I think we can attribute this to the fact that the patient did have an episode last night we got stuck in the rain and he thinks the dressing got wet. He did not change it I told him in the future if this were to happen to please go ahead and change the dressing it won't hurt to change it more frequently in that situation. Otherwise other than the maceration I do not see any evidence of anything being any worse but it also does not appear to be any better. Patient History Information obtained from Patient. Family History Cancer - Father, Diabetes - Father, Heart Disease - Mother,Father, Hypertension - Mother, Kidney Disease - Child, No family history of Lung Disease, Seizures, Stroke, Thyroid Problems, Tuberculosis. Social History Current every day smoker - 1 - 1/2 pack daily, Marital Status - Married, Alcohol Use - Rarely, Drug Use - No History, Caffeine Use - Daily - coffee. Review of Systems (ROS) Constitutional Symptoms (General Health) Denies complaints or symptoms of Fever, Chills. Respiratory The patient has no complaints or symptoms. Cardiovascular The patient has no complaints or symptoms. Psychiatric The patient has no complaints or symptoms. Objective Constitutional Well-nourished and well-hydrated in no acute  distress. Vitals Time Taken: 8:15 AM, Height: 69 in, Weight: 238 lbs, BMI: 35.1, Temperature: 97.8 F, Pulse: 76 bpm, Respiratory Rate: 18 breaths/min, Blood Pressure: 134/64 mmHg. [name], [name] ([id number]) Respiratory normal breathing without difficulty. Psychiatric this patient is able to make decisions and demonstrates good insight into disease process. Alert and Oriented x 3.  pleasant and cooperative. General Notes: Currently on inspection patient still has slough noted in the base of the wound bed. I cannot directly probe bone but I am getting closer to the surface of the bone which is being able to probe this area. I did actually obtain a wound culture today in order to evaluate for the possibility of infection from the deep portions of the wound. Nonetheless I did also perform sharp debridement today in order to try to clean away some of the slough. I was able to do so still there was no bone directly exposed although we are very close to that point in my fear is honestly that he may have osteomyelitis that may be the reason that the wound is not progressing as it really should be at this point. I explained this to the patient as well. Integumentary (Hair, Skin) Wound #1 status is Open. Original cause of wound was Trauma. The wound is located on the Left,Midline,Anterior Lower Leg. The wound measures 1cm length x 1.2cm width x 0.1cm depth; 0.942cm^2 area and 0.094cm^3 volume. There is Fat Layer (Subcutaneous Tissue) Exposed exposed. There is no tunneling or undermining noted. There is a medium amount of serous drainage noted. The wound margin is distinct with the outline attached to the wound base. There is large (67-100%) red, hyper - granulation within the wound bed. There is a small (1-33%) amount of necrotic tissue within the wound bed including Adherent Slough. The periwound skin appearance exhibited: Hemosiderin Staining. The periwound skin appearance did not exhibit: Callus, Crepitus, Excoriation, Induration, Rash, Scarring, Dry/Scaly, Maceration, Atrophie Blanche, Cyanosis, Ecchymosis, Mottled, Pallor, Rubor, Erythema. Periwound temperature was noted as No Abnormality. Wound #2 status is Open. Original cause of wound was Trauma. The wound is located on the Left Toe Great. The wound measures 0.7cm length x 0.7cm width x 0.4cm depth; 0.385cm^2 area and  0.154cm^3 volume. There is no tunneling noted, however, there is undermining starting at 12:00 and ending at 12:00 with a maximum distance of 0.4cm. There is a medium amount of serous drainage noted. The wound margin is flat and intact. There is small (1-33%) pink granulation within the wound bed. There is a large (67-100%) amount of necrotic tissue within the wound bed including Adherent Slough. The periwound skin appearance exhibited: Callus. The periwound skin appearance did not exhibit: Crepitus, Excoriation, Induration, Rash, Scarring, Dry/Scaly, Maceration, Atrophie Blanche, Cyanosis, Ecchymosis, Hemosiderin Staining, Mottled, Pallor, Rubor, Erythema. Periwound temperature was noted as No Abnormality. The periwound has tenderness on palpation. 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 There was a Excisional Skin/Subcutaneous Tissue Debridement with a total area of 1.2 sq cm performed by STONE III,  HOYT E., PA-C. With the following instrument(s): Curette to remove Viable and Non-Viable tissue/material. Material removed includes Subcutaneous Tissue, Slough, and Fibrin/Exudate after achieving pain control using Lidocaine 4% Topical Solution. No specimens were taken. A time out was conducted at 08:33, prior to the start of the procedure. A Minimum amount of bleeding was controlled with  Pressure. The procedure was tolerated well with a pain level of 0 throughout and a pain level of 0 following the procedure. Patient s Level of Consciousness post procedure was recorded as Awake and Alert. Post Debridement Measurements: 1cm length x 1.2cm width x 0.2cm depth; 0.188cm^3 volume. Character of Wound/Ulcer Post Debridement requires further debridement. Severity of Tissue Post Debridement is: Fat layer exposed. Post procedure Diagnosis Wound #1: Same as Pre-Procedure Wound #2 Pre-procedure diagnosis of Wound #2 is a Diabetic Wound/Ulcer of the Lower Extremity located on the Left Toe Great .Severity of Tissue Pre Debridement is: Fat layer exposed. There was a Excisional Skin/Subcutaneous Tissue Debridement with a total area of 0.49 sq cm performed by STONE III, HOYT E., PA-C. With the following instrument(s): Curette, Forceps, and Scissors to remove Viable and Non-Viable tissue/material. Material removed includes Callus, Subcutaneous Tissue, Slough, and Fibrin/Exudate after achieving pain control using Lidocaine 4% Topical Solution. 1 specimen was taken by a Swab and sent to the lab per facility protocol. A time out was conducted at 08:33, prior to the start of the procedure. A Minimum amount of bleeding was controlled with Pressure. The procedure was tolerated well with a pain level of 0 throughout and a pain level of 0 following the procedure. Patient s Level of Consciousness post procedure was recorded as Awake and Alert. Post Debridement Measurements: 0.8cm length x 0.8cm width x 0.4cm depth; 0.201cm^3 volume. Character of Wound/Ulcer Post Debridement requires further debridement. Severity of Tissue Post Debridement is: Fat layer exposed. Post procedure Diagnosis Wound #2: Same as Pre-Procedure Plan Wound Cleansing: Wound #1 Left,Midline,Anterior Lower Leg: Cleanse wound with mild soap and water Wound #2 Left Toe Great: Cleanse wound with mild soap and water May Shower, gently  pat wound dry prior to applying new dressing. Anesthetic (add to Medication List): Wound #1 Left,Midline,Anterior Lower Leg: Topical Lidocaine 4% cream applied to wound bed prior to debridement (In Clinic Only). Wound #2 Left Toe Great: Topical Lidocaine 4% cream applied to wound bed prior to debridement (In Clinic Only). Primary Wound Dressing: Wound #1 Left,Midline,Anterior Lower Leg: Hydrogel Collagen Wound #2 Left Toe Great: Silver Alginate Secondary Dressing: Wound #1 Left,Midline,Anterior Lower Leg: ABD pad Non-adherent pad Wound #2 Left Toe Great: Gauze and Kerlix/Conform - secure with coban [name], [name] ([id number]) Foam Dressing Change Frequency: Wound #1 Left,Midline,Anterior Lower Leg: Change dressing every week Wound #2 Left Toe Great: Change dressing every other day. Follow-up Appointments: Wound #1 Left,Midline,Anterior Lower Leg: Return Appointment in 1 week. Nurse Visit as needed Wound #2 Left Toe Great: Return Appointment in 1 week. Nurse Visit as needed Edema Control: Wound #1 Left,Midline,Anterior Lower Leg: 3 Layer Compression System - Left Lower Extremity - unna to anchor Patient to wear own compression stockings - right lower leg Elevate legs to the level of the heart and pump ankles as often as possible Additional Orders / Instructions: Wound #1 Left,Midline,Anterior Lower Leg: Stop Smoking Vitamin A; Vitamin C, Zinc - Please add a multivitamin with 100% of these Increase protein intake. Activity as tolerated Wound #2 Left Toe Great: Stop Smoking Vitamin A; Vitamin C, Zinc - Please add a multivitamin with 100% of these  Increase protein intake. Activity as tolerated Laboratory ordered were: Wound culture routine - left toe great Radiology ordered were: Magnetic Resonance Imaging (MRI) - left toe great The following medication(s) was prescribed: lidocaine topical 4 % cream 1 1 cream topical was prescribed at facility Bactrim DS oral 800  mg-160 mg tablet 1 1 tablet oral taken 2 times a day for 10 days starting 06/04/2018 Currently I am going to initiate a couple of things. Number one is I did send a wound culture today. Secondly I am ordering an MRI of the left great toe without contrast in order to evaluate for the possibility of osteomyelitis. Patient is in agreement with this plan. Again I do believe that a puncture wound such as stepping on a nail can definitely account for what we're seeing and possibly even introducing bacteria into the bone or the nearby area causing the ulcer that we're seeing and the trouble that we're having with getting this to heal. Nonetheless I'm hopeful them or I will be beneficial in that regard again the x-ray was negative but still I feel that the patient may potentially have a bone infection which is the reason for the MRI. The wound definitely is not making progress as it should for a normal healing wound. Subsequently we will see him for reevaluation in one weeks time to see were things stand. In the interim I'm gonna go ahead and send in a prescription for an antibiotic for him. I sent in a prescription for Bactrim DS today that he will take for 10 days. We will see were things stand at follow-up. Please see above for specific wound care orders. We will see patient for re-evaluation in 1 week(s) here in the clinic. If anything worsens or changes patient will contact our office for additional recommendations. Electronic Signature(s) Signed: 06/04/2018 5:32:46 PM By: Worthy Keeler PA-C Entered By: Worthy Keeler on 06/04/2018 10:25:18 [name], [name] ([id number]) [name], [name] ([id number]) -------------------------------------------------------------------------------- ROS/PFSH Details Patient Name: [name]. Date of Service: 06/04/2018 8:00 AM Medical Record Number: [id number] Patient Account Number: [id number] Date of Birth/Sex: April 03, 1951 (67 y.o. M) Treating RN: Ahmed Prima Primary Care Provider: BESS, KATY Other Clinician: Referring Provider: BESS, KATY Treating Provider/Extender: STONE III, HOYT Weeks in Treatment: 6 Information Obtained From Patient Wound History Do you currently have one or more open woundso Yes How many open wounds do you currently haveo 2 Approximately how long have you had your woundso 2 weeks How have you been treating your wound(s) until nowo yes Doctor Has your wound(s) ever healed and then re-openedo No Have you had any lab work done in the past montho No Have you tested positive for an antibiotic resistant organism (MRSA, VRE)o No Have you tested positive for osteomyelitis (bone infection)o No Have you had any tests for circulation on your legso No Have you had other problems associated with your woundso Infection, Swelling Constitutional Symptoms (General Health) Complaints and Symptoms: Negative for: Fever; Chills Eyes Medical History: Negative for: Cataracts; Glaucoma; Optic Neuritis Ear/Nose/Mouth/Throat Medical History: Negative for: Chronic sinus problems/congestion Hematologic/Lymphatic Medical History: Positive for: Lymphedema Negative for: Anemia; Hemophilia; Human Immunodeficiency Virus; Sickle Cell Disease Respiratory Complaints and Symptoms: No Complaints or Symptoms Medical History: Negative for: Aspiration; Asthma; Chronic Obstructive Pulmonary Disease (COPD); Pneumothorax; Sleep Apnea; Tuberculosis Cardiovascular Complaints and Symptoms: No Complaints or Symptoms Medical History: GAMALIEL, CHARNEY ([id number]) Positive for: Arrhythmia - pacemaker; Coronary Artery Disease; Hypertension; Myocardial Infarction Negative for: Deep Vein Thrombosis; Hypotension; Peripheral Arterial  Disease; Peripheral Venous Disease; Phlebitis; Vasculitis Gastrointestinal Medical History: Negative for: Cirrhosis ; Colitis; Crohnos; Hepatitis A; Hepatitis B; Hepatitis C Endocrine Medical History: Positive for:  Type II Diabetes Negative for: Type I Diabetes Time with diabetes: 5 years Treated with: Oral agents Blood sugar tested every day: Yes Tested : daily AM Blood sugar testing results: Breakfast: 174 Genitourinary Medical History: Negative for: End Stage Renal Disease Immunological Medical History: Negative for: Lupus Erythematosus; Raynaudos; Scleroderma Integumentary (Skin) Medical History: Negative for: History of Burn; History of pressure wounds Musculoskeletal Medical History: Negative for: Gout; Rheumatoid Arthritis; Osteoarthritis; Osteomyelitis Neurologic Medical History: Positive for: Neuropathy Negative for: Dementia; Quadriplegia; Paraplegia; Seizure Disorder Oncologic Medical History: Positive for: Received Chemotherapy; Received Radiation - Seeds prostate Psychiatric Complaints and Symptoms: No Complaints or Symptoms Medical History: Negative for: Anorexia/bulimia; Confinement Anxiety Trickett, ROMANI WILBON ([id number]) Immunizations Pneumococcal Vaccine: Received Pneumococcal Vaccination: Yes Tetanus Vaccine: Last tetanus shot: 12/25/2017 Implantable Devices Family and Social History Cancer: Yes - Father; Diabetes: Yes - Father; Heart Disease: Yes - Mother,Father; Hypertension: Yes - Mother; Kidney Disease: Yes - Child; Lung Disease: No; Seizures: No; Stroke: No; Thyroid Problems: No; Tuberculosis: No; Current every day smoker - 1 - 1/2 pack daily; Marital Status - Married; Alcohol Use: Rarely; Drug Use: No History; Caffeine Use: Daily - coffee; Financial Concerns: No; Food, Clothing or Shelter Needs: No; Support System Lacking: No; Transportation Concerns: No Physician Affirmation I have reviewed and agree with the above information. Electronic Signature(s) Signed: 06/04/2018 4:32:24 PM By: Alric Quan Signed: 06/04/2018 5:32:46 PM By: Worthy Keeler PA-C Entered By: Worthy Keeler on 06/04/2018 10:21:28 [name]  ([id number]) -------------------------------------------------------------------------------- SuperBill Details Patient Name: [name]. Date of Service: 06/04/2018 Medical Record Number: [id number] Patient Account Number: [id number] Date of Birth/Sex: Jan 18, 1951 (67 y.o. M) Treating RN: Ahmed Prima Primary Care Provider: BESS, KATY Other Clinician: Referring Provider: BESS, KATY Treating Provider/Extender: STONE III, HOYT Weeks in Treatment: 6 Diagnosis Coding ICD-10 Codes Code Description I87.2 Venous insufficiency (chronic) (peripheral) S81.812A Laceration without foreign body, left lower leg, initial encounter L97.822 Non-pressure chronic ulcer of other part of left lower leg with fat layer exposed L97.521 Non-pressure chronic ulcer of other part of left foot limited to breakdown of skin E11.621 Type 2 diabetes mellitus with foot ulcer I63.9 Cerebral infarction, unspecified I10 Essential (primary) hypertension Facility Procedures CPT4 Code Description: [id number] 11042 - DEB SUBQ TISSUE 20 SQ CM/< ICD-10 Diagnosis Description L97.521 Non-pressure chronic ulcer of other part of left foot limited t Modifier: o breakdown of s Quantity: 1 kin Physician Procedures CPT4 Code Description: 7867544 92010 - WC PHYS LEVEL 4 - EST PT ICD-10 Diagnosis Description I87.2 Venous insufficiency (chronic) (peripheral) O71.219X Laceration without foreign body, left lower leg, initial encoun L97.822 Non-pressure chronic ulcer of  other part of left lower leg with L97.521 Non-pressure chronic ulcer of other part of left foot limited t Modifier: 25 ter fat layer expose o breakdown of sk Quantity: 1 d in CPT4 Code Description: [id number] 11042 - WC PHYS SUBQ TISS 20 SQ CM ICD-10 Diagnosis Description L97.521 Non-pressure chronic ulcer of other part of left foot limited t Modifier: o breakdown of sk Quantity: 1 in Electronic Signature(s) Signed: 06/04/2018 5:32:46 PM By: Worthy Keeler PA-C Entered  By: Worthy Keeler on 06/04/2018 10:25:50

## 2018-06-07 NOTE — Progress Notes (Signed)
LORA, GLOMSKI ([id number]) Visit Report for 06/04/2018 Arrival Information Details Patient Name: KOURTLAND, COOPMAN. Date of Service: 06/04/2018 8:00 AM Medical Record Number: [id number] Patient Account Number: [id number] Date of Birth/Sex: [birth year] (67 y.o. M) Treating RN: Roger Shelter Primary Care Teja Costen: BESS, KATY Other Clinician: Referring Michalene Debruler: BESS, KATY Treating Sylvanus Telford/Extender: STONE III, HOYT Weeks in Treatment: 6 Visit Information History Since Last Visit All ordered tests and consults were completed: No Patient Arrived: Ambulatory Added or deleted any medications: No Arrival Time: 08:05 Any new allergies or adverse reactions: No Accompanied By: self Had a fall or experienced change in No Transfer Assistance: None activities of daily living that may affect Patient Identification Verified: Yes risk of falls: Secondary Verification Process Completed: Yes Signs or symptoms of abuse/neglect since last visito No Patient Has Alerts: Yes Hospitalized since last visit: No Patient Alerts: Type II Diabetic Implantable device outside of the clinic excluding No cellular tissue based products placed in the center since last visit: Pain Present Now: Yes Electronic Signature(s) Signed: 06/04/2018 11:44:07 AM By: Roger Shelter Entered By: Roger Shelter on 06/04/2018 08:05:57 Vanderploeg, Huntley Dec ([id number]) -------------------------------------------------------------------------------- Encounter Discharge Information Details Patient Name: [name] Date of Service: 06/04/2018 8:00 AM Medical Record Number: [id number] Patient Account Number: [id number] Date of Birth/Sex: 1951-08-21 (67 y.o. M) Treating RN: Roger Shelter Primary Care Rupa Lagan: BESS, KATY Other Clinician: Referring Tejon Gracie: BESS, KATY Treating Carlea Badour/Extender: Melburn Hake, HOYT Weeks in Treatment: 6 Encounter Discharge Information Items Discharge Condition: Stable Ambulatory Status:  Ambulatory Discharge Destination: Home Transportation: Private Auto Schedule Follow-up Appointment: Yes Clinical Summary of Care: Electronic Signature(s) Signed: 06/04/2018 11:44:07 AM By: Roger Shelter Entered By: Roger Shelter on 06/04/2018 09:14:52 Parzych, Huntley Dec ([id number]) -------------------------------------------------------------------------------- Lower Extremity Assessment Details Patient Name: [name] Date of Service: 06/04/2018 8:00 AM Medical Record Number: [id number] Patient Account Number: [id number] Date of Birth/Sex: 1951-09-13 (67 y.o. M) Treating RN: Roger Shelter Primary Care Janaiya Beauchesne: BESS, KATY Other Clinician: Referring Emmamae Mcnamara: BESS, KATY Treating Gurveer Colucci/Extender: STONE III, HOYT Weeks in Treatment: 6 Edema Assessment Assessed: [Left: No] [Right: No] Edema: [Left: Ye] [Right: s] Calf Left: Right: Point of Measurement: 34 cm From Medial Instep 39 cm cm Ankle Left: Right: Point of Measurement: 11 cm From Medial Instep 26 cm cm Vascular Assessment Claudication: Claudication Assessment [Left:None] Pulses: Dorsalis Pedis Palpable: [Left:Yes] Posterior Tibial Extremity colors, hair growth, and conditions: Extremity Color: [Left:Hyperpigmented] Hair Growth on Extremity: [Left:Yes] Temperature of Extremity: [Left:Warm] Capillary Refill: [Left:< 3 seconds] Toe Nail Assessment Left: Right: Thick: No Discolored: No Deformed: No Improper Length and Hygiene: No Electronic Signature(s) Signed: 06/04/2018 11:44:07 AM By: Roger Shelter Entered By: Roger Shelter on 06/04/2018 08:25:07 Zaragoza, Huntley Dec ([id number]) -------------------------------------------------------------------------------- Multi Wound Chart Details Patient Name: [name] Date of Service: 06/04/2018 8:00 AM Medical Record Number: [id number] Patient Account Number: [id number] Date of Birth/Sex: July 16, 1951 (67 y.o. M) Treating RN: Ahmed Prima Primary Care Rayleen Wyrick: BESS, KATY Other Clinician: Referring Dwayn Moravek: BESS, KATY Treating Nayquan Evinger/Extender: STONE III, HOYT Weeks in Treatment: 6 Vital Signs Height(in): 45 Pulse(bpm): 76 Weight(lbs): 238 Blood Pressure(mmHg): 134/64 Body Mass Index(BMI): 35 Temperature(F): 97.8 Respiratory Rate 18 (breaths/min): Photos: [1:No Photos] [2:No Photos] [N/A:N/A] Wound Location: [1:Left Lower Leg - Midline, Anterior] [2:Left Toe Great] [N/A:N/A] Wounding Event: [1:Trauma] [2:Trauma] [N/A:N/A] Primary Etiology: [1:Diabetic Wound/Ulcer of the Lower Extremity] [2:Diabetic Wound/Ulcer of the Lower Extremity] [N/A:N/A] Secondary Etiology: [1:Trauma, Other] [2:Trauma, Other] [N/A:N/A] Comorbid History: [1:Lymphedema, Arrhythmia, Coronary Artery Disease, Hypertension, Myocardial Infarction, Type II Diabetes, Neuropathy, Received Chemotherapy, Received Radiation] [  2:Lymphedema, Arrhythmia, Coronary Artery Disease, Hypertension,  Myocardial Infarction, Type II Diabetes, Neuropathy, Received Chemotherapy, Received Radiation] [N/A:N/A] Date Acquired: [1:04/05/2018] [2:12/25/2017] [N/A:N/A] Weeks of Treatment: [1:6] [2:6] [N/A:N/A] Wound Status: [1:Open] [2:Open] [N/A:N/A] Pending Amputation on [1:No] [2:Yes] [N/A:N/A] Presentation: Measurements L x W x D [1:1x1.2x0.1] [2:0.7x0.7x0.4] [N/A:N/A] (cm) Area (cm) : [1:0.942] [2:0.385] [N/A:N/A] Volume (cm) : [1:0.094] [2:0.154] [N/A:N/A] % Reduction in Area: [1:68.40%] [2:-719.10%] [N/A:N/A] % Reduction in Volume: [1:68.50%] [2:-2980.00%] [N/A:N/A] Starting Position 1 [2:12] (o'clock): Ending Position 1 [2:12] (o'clock): Maximum Distance 1 (cm): [2:0.4] Undermining: [1:No] [2:Yes] [N/A:N/A] Classification: [1:Grade 1] [2:Grade 1] [N/A:N/A] Exudate Amount: [1:Medium] [2:Medium] [N/A:N/A] Exudate Type: [1:Serous] [2:Serous] [N/A:N/A] Exudate Color: [1:amber] [2:amber] [N/A:N/A] Wound Margin: [1:Distinct, outline attached] [2:Flat and  Intact] [N/A:N/A] Granulation Amount: [1:Large (67-100%)] [2:Small (1-33%)] [N/A:N/A] Granulation Quality: Red, Hyper-granulation Pink N/A Necrotic Amount: Small (1-33%) Large (67-100%) N/A Exposed Structures: Fat Layer (Subcutaneous Fascia: No N/A Tissue) Exposed: Yes Fat Layer (Subcutaneous Tissue) Exposed: No Tendon: No Muscle: No Joint: No Bone: No Epithelialization: Small (1-33%) Small (1-33%) N/A Periwound Skin Texture: Excoriation: No Callus: Yes N/A Induration: No Excoriation: No Callus: No Induration: No Crepitus: No Crepitus: No Rash: No Rash: No Scarring: No Scarring: No Periwound Skin Moisture: Maceration: No Maceration: No N/A Dry/Scaly: No Dry/Scaly: No Periwound Skin Color: Hemosiderin Staining: Yes Atrophie Blanche: No N/A Atrophie Blanche: No Cyanosis: No Cyanosis: No Ecchymosis: No Ecchymosis: No Erythema: No Erythema: No Hemosiderin Staining: No Mottled: No Mottled: No Pallor: No Pallor: No Rubor: No Rubor: No Temperature: No Abnormality No Abnormality N/A Tenderness on Palpation: No Yes N/A Wound Preparation: Ulcer Cleansing: Ulcer Cleansing: N/A Rinsed/Irrigated with Saline Rinsed/Irrigated with Saline Topical Anesthetic Applied: Topical Anesthetic Applied: Other: lidocaine 4% Other: lidocaine 4% Treatment Notes Electronic Signature(s) Signed: 06/04/2018 4:32:24 PM By: Alric Quan Entered By: Alric Quan on 06/04/2018 08:30:03 Rosenberry, Huntley Dec ([id number]) -------------------------------------------------------------------------------- [place] Details Patient Name: [name] Date of Service: 06/04/2018 8:00 AM Medical Record Number: [id number] Patient Account Number: [id number] Date of Birth/Sex: 02-06-1951 (67 y.o. M) Treating RN: Ahmed Prima Primary Care Deandria Klute: BESS, KATY Other Clinician: Referring Jaquelyn Sakamoto: BESS, KATY Treating Ariyon Mittleman/Extender: STONE III, HOYT Weeks in Treatment:  6 Active Inactive ` Abuse / Safety / Falls / Self Care Management Nursing Diagnoses: Potential for falls Goals: Patient will remain injury free related to falls Date Initiated: 04/19/2018 Target Resolution Date: 05/04/2018 Goal Status: Active Interventions: Assess fall risk on admission and as needed Notes: ` Nutrition Nursing Diagnoses: Impaired glucose control: actual or potential Goals: Patient/caregiver agrees to and verbalizes understanding of need to use nutritional supplements and/or vitamins as prescribed Date Initiated: 04/19/2018 Target Resolution Date: 06/07/2018 Goal Status: Active Patient/caregiver verbalizes understanding of need to maintain therapeutic glucose control per primary care physician Date Initiated: 04/19/2018 Target Resolution Date: 05/31/2018 Goal Status: Active Interventions: Assess HgA1c results as ordered upon admission and as needed Assess patient nutrition upon admission and as needed per policy Notes: ` Orientation to the Wound Care Program Nursing Diagnoses: Knowledge deficit related to the wound healing center program Goals: Patient/caregiver will verbalize understanding of the [place] NASIIR, MONTS ([id number]) Date Initiated: 04/19/2018 Target Resolution Date: 06/29/2018 Goal Status: Active Interventions: Provide education on orientation to the wound center Notes: ` Wound/Skin Impairment Nursing Diagnoses: Impaired tissue integrity Goals: Ulcer/skin breakdown will heal within 14 weeks Date Initiated: 04/19/2018 Target Resolution Date: 06/29/2018 Goal Status: Active Interventions: Assess patient/caregiver ability to obtain necessary supplies Assess patient/caregiver ability to perform ulcer/skin care regimen upon admission and  as needed Assess ulceration(s) every visit Notes: Electronic Signature(s) Signed: 06/04/2018 4:32:24 PM By: Alric Quan Entered By: Alric Quan on 06/04/2018 08:29:52 Massing,  Huntley Dec ([id number]) -------------------------------------------------------------------------------- Pain Assessment Details Patient Name: [name] Date of Service: 06/04/2018 8:00 AM Medical Record Number: [id number] Patient Account Number: [id number] Date of Birth/Sex: 1951-06-29 (67 y.o. M) Treating RN: Roger Shelter Primary Care Navjot Loera: BESS, KATY Other Clinician: Referring Audryanna Zurita: BESS, KATY Treating [name] [name]/Extender: STONE III, HOYT Weeks in Treatment: 6 Active Problems Location of Pain Severity and Description of Pain Patient Has Paino Yes Site Locations Duration of the Pain. Constant / Intermittento Intermittent Rate the pain. Current Pain Level: 8 Character of Pain Describe the Pain: Sharp, Stabbing, Other: stinging Pain Management and Medication Current Pain Management: Electronic Signature(s) Signed: 06/04/2018 11:44:07 AM By: Roger Shelter Entered By: Roger Shelter on 06/04/2018 08:06:23 Capote, Huntley Dec ([id number]) -------------------------------------------------------------------------------- Patient/Caregiver Education Details Patient Name: [name] Date of Service: 06/04/2018 8:00 AM Medical Record Number: [id number] Patient Account Number: [id number] Date of Birth/Gender: 1951/04/05 (67 y.o. M) Treating RN: Roger Shelter Primary Care Physician: BESS, KATY Other Clinician: Referring Physician: Lillard Anes Treating Physician/Extender: Melburn Hake, HOYT Weeks in Treatment: 6 Education Assessment Education Provided To: Patient Education Topics Provided Wound Debridement: Handouts: Wound Debridement Methods: Explain/Verbal Responses: State content correctly Wound/Skin Impairment: Handouts: Caring for Your Ulcer Methods: Explain/Verbal Responses: State content correctly Electronic Signature(s) Signed: 06/04/2018 11:44:07 AM By: Roger Shelter Entered By: Roger Shelter on 06/04/2018 09:15:10 Matney, Huntley Dec  ([id number]) -------------------------------------------------------------------------------- Wound Assessment Details Patient Name: [name] Date of Service: 06/04/2018 8:00 AM Medical Record Number: [id number] Patient Account Number: [id number] Date of Birth/Sex: 1951-07-06 (67 y.o. M) Treating RN: Roger Shelter Primary Care Abriella Filkins: BESS, KATY Other Clinician: Referring Dynisha Due: BESS, KATY Treating Lona Six/Extender: STONE III, HOYT Weeks in Treatment: 6 Wound Status Wound Number: 1 Primary Diabetic Wound/Ulcer of the Lower Extremity Etiology: Wound Location: Left Lower Leg - Midline, Anterior Secondary Trauma, Other Wounding Event: Trauma Etiology: Date Acquired: 04/05/2018 Wound Open Weeks Of Treatment: 6 Status: Clustered Wound: No Comorbid Lymphedema, Arrhythmia, Coronary Artery History: Disease, Hypertension, Myocardial Infarction, Type II Diabetes, Neuropathy, Received Chemotherapy, Received Radiation Photos Photo Uploaded By: Roger Shelter on 06/04/2018 11:45:53 Wound Measurements Length: (cm) 1 Width: (cm) 1.2 Depth: (cm) 0.1 Area: (cm) 0.942 Volume: (cm) 0.094 % Reduction in Area: 68.4% % Reduction in Volume: 68.5% Epithelialization: Small (1-33%) Tunneling: No Undermining: No Wound Description Classification: Grade 1 Wound Margin: Distinct, outline attached Exudate Amount: Medium Exudate Type: Serous Exudate Color: amber Foul Odor After Cleansing: No Slough/Fibrino Yes Wound Bed Granulation Amount: Large (67-100%) Exposed Structure Granulation Quality: Red, Hyper-granulation Fat Layer (Subcutaneous Tissue) Exposed: Yes Necrotic Amount: Small (1-33%) Necrotic Quality: Adherent Slough Periwound Skin Texture Claiborne, Huntley Dec ([id number]) Texture Color No Abnormalities Noted: No No Abnormalities Noted: No Callus: No Atrophie Blanche: No Crepitus: No Cyanosis: No Excoriation: No Ecchymosis: No Induration: No Erythema:  No Rash: No Hemosiderin Staining: Yes Scarring: No Mottled: No Pallor: No Moisture Rubor: No No Abnormalities Noted: No Dry / Scaly: No Temperature / Pain Maceration: No Temperature: No Abnormality Wound Preparation Ulcer Cleansing: Rinsed/Irrigated with Saline Topical Anesthetic Applied: Other: lidocaine 4%, Treatment Notes Wound #1 (Left, Midline, Anterior Lower Leg) 1. Cleansed with: Clean wound with Normal Saline 2. Anesthetic Topical Lidocaine 4% cream to wound bed prior to debridement 3. Peri-wound Care: Moisturizing lotion 4. Dressing Applied: Prisma Ag 5. Secondary Dressing Applied Non-Adherent pad 7. Secured with 3 Layer Compression System - Left Lower Extremity  Notes Unna to secure at top Electronic Signature(s) Signed: 06/04/2018 11:44:07 AM By: Roger Shelter Entered By: Roger Shelter on 06/04/2018 08:23:48 Elks, Huntley Dec ([id number]) -------------------------------------------------------------------------------- Wound Assessment Details Patient Name: [name] Date of Service: 06/04/2018 8:00 AM Medical Record Number: [id number] Patient Account Number: [id number] Date of Birth/Sex: Apr 16, 1951 (67 y.o. M) Treating RN: Roger Shelter Primary Care Auriah Hollings: BESS, KATY Other Clinician: Referring Anwitha Mapes: BESS, KATY Treating Keila Turan/Extender: STONE III, HOYT Weeks in Treatment: 6 Wound Status Wound Number: 2 Primary Diabetic Wound/Ulcer of the Lower Extremity Etiology: Wound Location: Left Toe Great Secondary Trauma, Other Wounding Event: Trauma Etiology: Date Acquired: 12/25/2017 Wound Open Weeks Of Treatment: 6 Status: Clustered Wound: No Comorbid Lymphedema, Arrhythmia, Coronary Artery Pending Amputation On Presentation History: Disease, Hypertension, Myocardial Infarction, Type II Diabetes, Neuropathy, Received Chemotherapy, Received Radiation Photos Photo Uploaded By: Roger Shelter on 06/04/2018 11:45:53 Wound  Measurements Length: (cm) 0.7 Width: (cm) 0.7 Depth: (cm) 0.4 Area: (cm) 0.385 Volume: (cm) 0.154 % Reduction in Area: -719.1% % Reduction in Volume: -2980% Epithelialization: Small (1-33%) Tunneling: No Undermining: Yes Starting Position (o'clock): 12 Ending Position (o'clock): 12 Maximum Distance: (cm) 0.4 Wound Description Classification: Grade 1 Wound Margin: Flat and Intact Exudate Amount: Medium Exudate Type: Serous Exudate Color: amber Foul Odor After Cleansing: No Slough/Fibrino Yes Wound Bed Granulation Amount: Small (1-33%) Exposed Structure Granulation Quality: Pink Fascia Exposed: No Aderman, Huntley Dec ([id number]) Necrotic Amount: Large (67-100%) Fat Layer (Subcutaneous Tissue) Exposed: No Necrotic Quality: Adherent Slough Tendon Exposed: No Muscle Exposed: No Joint Exposed: No Bone Exposed: No Periwound Skin Texture Texture Color No Abnormalities Noted: No No Abnormalities Noted: No Callus: Yes Atrophie Blanche: No Crepitus: No Cyanosis: No Excoriation: No Ecchymosis: No Induration: No Erythema: No Rash: No Hemosiderin Staining: No Scarring: No Mottled: No Pallor: No Moisture Rubor: No No Abnormalities Noted: No Dry / Scaly: No Temperature / Pain Maceration: No Temperature: No Abnormality Tenderness on Palpation: Yes Wound Preparation Ulcer Cleansing: Rinsed/Irrigated with Saline Topical Anesthetic Applied: Other: lidocaine 4%, Treatment Notes Wound #2 (Left Toe Great) 1. Cleansed with: Clean wound with Normal Saline 2. Anesthetic Topical Lidocaine 4% cream to wound bed prior to debridement 4. Dressing Applied: Other dressing (specify in notes) Notes silvercell, foam with curt out, gauze then wrap with conform wrap with tape Electronic Signature(s) Signed: 06/04/2018 11:44:07 AM By: Roger Shelter Entered By: Roger Shelter on 06/04/2018 08:21:23 [name]  ([id number]) -------------------------------------------------------------------------------- [place] Details Patient Name: [name] Date of Service: 06/04/2018 8:00 AM Medical Record Number: [id number] Patient Account Number: [id number] Date of Birth/Sex: 11/03/1951 (67 y.o. M) Treating RN: Roger Shelter Primary Care Peniel Biel: BESS, KATY Other Clinician: Referring Idriss Quackenbush: BESS, KATY Treating Ahava Kissoon/Extender: STONE III, HOYT Weeks in Treatment: 6 Vital Signs Time Taken: 08:15 Temperature (F): 97.8 Height (in): 69 Pulse (bpm): 76 Weight (lbs): 238 Respiratory Rate (breaths/min): 18 Body Mass Index (BMI): 35.1 Blood Pressure (mmHg): 134/64 Reference Range: 80 - 120 mg / dl Electronic Signature(s) Signed: 06/04/2018 11:44:07 AM By: Roger Shelter Entered By: Roger Shelter on 06/04/2018 08:16:49

## 2018-06-10 LAB — CUP PACEART REMOTE DEVICE CHECK
Date Time Interrogation Session: 20190524003905
MDC IDC PG IMPLANT DT: 20170821

## 2018-06-11 ENCOUNTER — Encounter: Payer: PPO | Admitting: Physician Assistant

## 2018-06-11 DIAGNOSIS — I872 Venous insufficiency (chronic) (peripheral): Secondary | ICD-10-CM | POA: Diagnosis not present

## 2018-06-11 DIAGNOSIS — L97522 Non-pressure chronic ulcer of other part of left foot with fat layer exposed: Secondary | ICD-10-CM | POA: Diagnosis not present

## 2018-06-11 DIAGNOSIS — L97822 Non-pressure chronic ulcer of other part of left lower leg with fat layer exposed: Secondary | ICD-10-CM | POA: Diagnosis not present

## 2018-06-15 NOTE — Progress Notes (Signed)
BROK, STOCKING ([id number]) Visit Report for 06/11/2018 Chief Complaint Document Details Patient Name: [name], [name]. Date of Service: 06/11/2018 8:45 AM Medical Record Number: [id number] Patient Account Number: [id number] Date of Birth/Sex: 1951/03/26 (67 y.o. M) Treating RN: Ahmed Prima Primary Care Provider: BESS, Valetta Fuller Other Clinician: Referring Provider: BESS, KATY Treating Provider/Extender: STONE III,  Weeks in Treatment: 7 Information Obtained from: Patient Chief Complaint Left lower leg and left great toe ulcer Electronic Signature(s) Signed: 06/12/2018 7:52:50 AM By: Worthy Keeler PA-C Entered By: Worthy Keeler on 06/11/2018 09:08:30 Andreas, Huntley Dec ([id number]) -------------------------------------------------------------------------------- HPI Details Patient Name: [name]. Date of Service: 06/11/2018 8:45 AM Medical Record Number: [id number] Patient Account Number: [id number] Date of Birth/Sex: 1951/03/02 (67 y.o. M) Treating RN: Ahmed Prima Primary Care Provider: BESS, KATY Other Clinician: Referring Provider: BESS, KATY Treating Provider/Extender: STONE III,  Weeks in Treatment: 7 History of Present Illness Associated Signs and Symptoms: Patient has a history of chronic venous insufficiency, diabetes mellitus type II, its reform fraction, hypertension HPI Description: 04/19/18 on evaluation today patient presents initially concerning an ulcer of his left great toe and left anterior shin. He states that the shin was definitely a trauma injury where he struck this on a metal pole injuring leg. In regard to the distal left great toe he's really not sure what happened here although he does have a significant callous noted as well. He does have a pacemaker. Fortunately his ABI was 0.98 on the right and 0.83 on the left this appears to be doing fairly well. Overall I'm pleased with that. Nonetheless he states that this really has not seem to be  improving. He has been tolerating the dressing changes without complication. The left anterior leg ulcer began on 04/05/18 according to what he tells [place] today although one note I did have for review said it was April 5. I'm inclined to believe it was the fifth she did have an office visit on 04/01/18 with his family nurse practitioner Faith Rogue. Nonetheless either way it's been going on this month for several weeks. The left great toe has actually been present since January 1 he does not know how this occurred. Fortunately has no pain at the site although he does have pain on the left anterior shin. His hemoglobin A1c is 8.0 in March 2019. 04/26/18 on evaluation today patient presents for follow-up concerning his life into your [place] also as well as his left great toe also. Fortunately the shin actually appears to be doing better on evaluation today in my opinion I'm not seeing as much in the way of fluffernutter on the surface although it does still require debridement today this is improved. Size wise it's really not much different. With that being said the total ulcer actually appears to be a little bit more macerated today I'm not sure why they state that several days ago when this was changed that was not the case. Fortunately is not having severe pain although both areas do hurt the shin is worse. 05/03/18 on evaluation today patient appears to be doing better in regard to his left first toe and left shin ulcers. He is been tolerating the dressing changes without complication and the good news is this seems to be showing signs of getting better. Overall I'm very pleased with the progress that has been made over the past week. Patient is still having pain especially in regard to the left shin. 05/10/18 on evaluation today patient appears to be doing  rather well in regard to his two ulcers. The toe ulcer appears to be smaller he does have some undermining however in this had to be cleaned  out just a little bit. With that being said overall I feel like this is showing signs of improvement. The left anterior shin ulcer also showing signs of improvement at this time. There does not appear to be any evidence of infection which is good news. 05/21/18 on evaluation today patient appears to be doing very well in regard to his left shin ulcer. He has been tolerating the dressing changes without complication. With that being said he is having issues at this point with his great toe on the left. We have not x-rayed that at this point I think we may need to. Nonetheless he has pain really at the roughly 5 o'clock location but nowhere else and I really cannot explain this I do not see any obvious form body. Nonetheless he continues to have issues with getting this area to close it's not progressing as nicely. 05/28/18 on evaluation today patient appears to be doing very well in regard to his left lower extremity anterior ulcer. He has been tolerating the dressing changes without complication. Overall this is making excellent progress with the collagen dressing. His left great toe x-ray did return and showed that he had no if you that normality. He did have a plantar heel spur but this is more degenerative. Otherwise just arthritis was noted no osteomyelitis and no obvious form body was visualized. Overall things seem to be going fairly well. With that being said he still has pain when looking at the toe from the plantar aspect at roughly the six back to the 4 o'clock location which is not quite as bad as last time but still hurts more than any other part of his wound. It does appear the Iodoflex has been of benefit however. 06/04/18 on evaluation today patient appears to be doing excellent in regard to his left anterior shin ulcer. This has made excellent progress and is showing signs of improving in fact very close to completely closing in healing. Nonetheless unfortunately his left great toe has  not faired quite as well as far as progress is concerned. He notes that after last week's [place], BOWE SIDOR. ([id number]) debridement he actually seem to be doing a little bit better for a couple of days until Thursday of last week when he had increases in discomfort as well is changes in the draining. He states that the drainage has been more green in color according to his wife and what he has seen. He's also had more discomfort. He relates to me again that he really feels like this all started when he stepped on a nail although he does not exactly remember it it seemed to be a puncture wound that he feels may be the culprit for why all this started. Nonetheless I am getting more concerned about the possibility of osteomyelitis despite the fact that the x-ray was negative I was hopeful in this interim since last week to this week that we would see some improvement in the overall appearance of the toe. Unfortunately if anything I feel like it's a little worse. It was macerated but I think we can attribute this to the fact that the patient did have an episode last night we got stuck in the rain and he thinks the dressing got wet. He did not change it I told him in the future if this were to  happen to please go ahead and change the dressing it won't hurt to change it more frequently in that situation. Otherwise other than the maceration I do not see any evidence of anything being any worse but it also does not appear to be any better. 06/11/18 on evaluation today patient's wounds both the great toe as well as the left shin both on the left side show signs of good improvement. Fortunately the patient's culture came back negative in regard to the toe and there does not appear to be any evidence of infection. His MRI is actually scheduled for 06/19/18. Fortunately this is right before I see him next week and then we can go to the results of the culture at that point. Patient is in agreement with that plan.  With that being said otherwise he has been showing signs of having less pain in regard to the toe which is also good news my hope is that he does not have osteomyelitis. Electronic Signature(s) Signed: 06/12/2018 7:52:50 AM By: Worthy Keeler PA-C Entered By: Worthy Keeler on 06/11/2018 17:45:36 [place], Huntley Dec ([id number]) -------------------------------------------------------------------------------- Physical Exam Details Patient Name: [name] Date of Service: 06/11/2018 8:45 AM Medical Record Number: [id number] Patient Account Number: [id number] Date of Birth/Sex: [birth year] (67 y.o. M) Treating RN: Ahmed Prima Primary Care Provider: BESS, KATY Other Clinician: Referring Provider: BESS, KATY Treating Provider/Extender: STONE III,  Weeks in Treatment: 7 Constitutional Well-nourished and well-hydrated in no acute distress. Respiratory normal breathing without difficulty. clear to auscultation bilaterally. Cardiovascular regular rate and rhythm with normal S1, S2. trace pitting edema of the bilateral lower extremities. Psychiatric this patient is able to make decisions and demonstrates good insight into disease process. Alert and Oriented x 3. pleasant and cooperative. Notes Currently patient's wounds seem to show signs of making some progress and again I did not have to repeat any debridement today. Overall this is good news and I think show signs of good progress. The one key factor we need to make sure right now is that he does not have any evidence of osteomyelitis in this great toe. Electronic Signature(s) Signed: 06/12/2018 7:52:50 AM By: Worthy Keeler PA-C Entered By: Worthy Keeler on 06/11/2018 17:46:17 [place], Huntley Dec ([id number]) -------------------------------------------------------------------------------- Physician Orders Details Patient Name: [name]. Date of Service: 06/11/2018 8:45 AM Medical Record Number: [id number] Patient  Account Number: [id number] Date of Birth/Sex: 04/28/1951 (67 y.o. M) Treating RN: Ahmed Prima Primary Care Provider: BESS, KATY Other Clinician: Referring Provider: BESS, KATY Treating Provider/Extender: STONE III,  Weeks in Treatment: 7 Verbal / Phone Orders: Yes Clinician: Carolyne Fiscal, Debi Read Back and Verified: Yes Diagnosis Coding ICD-10 Coding Code Description I87.2 Venous insufficiency (chronic) (peripheral) O67.672C Laceration without foreign body, left lower leg, initial encounter L97.822 Non-pressure chronic ulcer of other part of left lower leg with fat layer exposed L97.521 Non-pressure chronic ulcer of other part of left foot limited to breakdown of skin E11.621 Type 2 diabetes mellitus with foot ulcer I63.9 Cerebral infarction, unspecified I10 Essential (primary) hypertension Wound Cleansing Wound #1 Left,Midline,Anterior Lower Leg o Cleanse wound with mild soap and water Wound #2 Left Toe Great o Cleanse wound with mild soap and water o May Shower, gently pat wound dry prior to applying new dressing. Anesthetic (add to Medication List) Wound #1 Left,Midline,Anterior Lower Leg o Topical Lidocaine 4% cream applied to wound bed prior to debridement (In Clinic Only). Wound #2 Left Toe Great o Topical Lidocaine 4% cream applied to wound bed prior  to debridement (In Clinic Only). Primary Wound Dressing Wound #1 Left,Midline,Anterior Lower Leg o Hydrogel o Collagen Wound #2 Left Toe Great o Silver Alginate Secondary Dressing Wound #1 Left,Midline,Anterior Lower Leg o ABD pad o Non-adherent pad Wound #2 Left Toe Great o Gauze and Kerlix/Conform - secure with coban o Foam Urwin, Huntley Dec ([id number]) Dressing Change Frequency Wound #1 Left,Midline,Anterior Lower Leg o Change dressing every week Wound #2 Left Toe Great o Change dressing every other day. Follow-up Appointments Wound #1 Left,Midline,Anterior Lower Leg o Return  Appointment in 1 week. o Nurse Visit as needed Wound #2 Left Toe Great o Return Appointment in 1 week. o Nurse Visit as needed Edema Control Wound #1 Left,Midline,Anterior Lower Leg o 3 Layer Compression System - Left Lower Extremity - unna to anchor o Patient to wear own compression stockings - right lower leg o Elevate legs to the level of the heart and pump ankles as often as possible Additional Orders / Instructions Wound #1 Left,Midline,Anterior Lower Leg o Stop Smoking o Vitamin A; Vitamin C, Zinc - Please add a multivitamin with 100% of these o Increase protein intake. o Activity as tolerated Wound #2 Left Toe Great o Stop Smoking o Vitamin A; Vitamin C, Zinc - Please add a multivitamin with 100% of these o Increase protein intake. o Activity as tolerated Patient Medications Allergies: bee venom protein (honey bee), penicillin, oyster shell Notifications Medication Indication Start End lidocaine DOSE 1 - topical 4 % cream - 1 cream topical Electronic Signature(s) Signed: 06/11/2018 4:21:54 PM By: Alric Quan Signed: 06/12/2018 7:52:50 AM By: Worthy Keeler PA-C Entered By: Alric Quan on 06/11/2018 09:23:57 Ferrero, Huntley Dec ([id number]) -------------------------------------------------------------------------------- Prescription 06/11/2018 Patient Name: [name] Provider: Worthy Keeler PA-C Date of Birth: 1951/09/03 NPI#: [id number] Sex: [name] DEA#: [id number] Phone #: [phone] License #: Patient Address: [place], [place] [address] [address] [place], [place] [address] [phone] Allergies bee venom protein (honey bee) penicillin oyster shell Medication Medication: Route: Strength: Form: lidocaine topical 4% cream Class: TOPICAL LOCAL ANESTHETICS Dose: Frequency / Time: Indication: 1 1 cream  topical Number of Refills: Number of Units: 0 Generic Substitution: Start Date: End Date: Administered at [place]: Yes Time Administered: Time Discontinued: Note to Pharmacy: Signature(s): Date(s): Electronic Signature(s) ALEXANDRA, POSADAS ([id number]) Signed: 06/11/2018 4:21:54 PM By: Alric Quan Signed: 06/12/2018 7:52:50 AM By: Worthy Keeler PA-C Entered By: Alric Quan on 06/11/2018 09:23:59 [place], Huntley Dec ([id number]) --------------------------------------------------------------------------------  Problem List Details Patient Name: [name] Date of Service: 06/11/2018 8:45 AM Medical Record Number: [id number] Patient Account Number: [id number] Date of Birth/Sex: 01/14/1951 (67 y.o. M) Treating RN: Ahmed Prima Primary Care Provider: BESS, KATY Other Clinician: Referring Provider: BESS, KATY Treating Provider/Extender: Melburn Hake,  Weeks in Treatment: 7 Active Problems ICD-10 Evaluated Encounter Code Description Active Date Today Diagnosis I87.2 Venous insufficiency (chronic) (peripheral) 04/19/2018 No Yes S81.812A Laceration without foreign body, left lower leg, initial 04/19/2018 No Yes encounter L97.822 Non-pressure chronic ulcer of other part of left lower leg with 04/19/2018 No Yes fat layer exposed L97.521 Non-pressure chronic ulcer of other part of left foot limited to 04/19/2018 No Yes breakdown of skin E11.621 Type 2 diabetes mellitus with foot ulcer 04/19/2018 No Yes I63.9 Cerebral infarction, unspecified 04/19/2018 No Yes I10 Essential (primary) hypertension 04/19/2018 No Yes Inactive Problems Resolved Problems Electronic Signature(s) Signed: 06/12/2018 7:52:50 AM By: Worthy Keeler PA-C Entered  By: Worthy Keeler on 06/11/2018 09:08:23 [name] ([id number]) -------------------------------------------------------------------------------- Progress Note Details Patient Name: [name] Date  of Service: 06/11/2018 8:45 AM Medical Record Number: [id number] Patient Account Number: [id number] Date of Birth/Sex: 1951-01-21 (67 y.o. M) Treating RN: Ahmed Prima Primary Care Provider: BESS, KATY Other Clinician: Referring Provider: BESS, KATY Treating Provider/Extender: STONE III,  Weeks in Treatment: 7 Subjective Chief Complaint Information obtained from Patient Left lower leg and left great toe ulcer History of Present Illness (HPI) The following HPI elements were documented for the patient's wound: Associated Signs and Symptoms: Patient has a history of chronic venous insufficiency, diabetes mellitus type II, its reform fraction, hypertension 04/19/18 on evaluation today patient presents initially concerning an ulcer of his left great toe and left anterior shin. He states that the shin was definitely a trauma injury where he struck this on a metal pole injuring leg. In regard to the distal left great toe he's really not sure what happened here although he does have a significant callous noted as well. He does have a pacemaker. Fortunately his ABI was 0.98 on the right and 0.83 on the left this appears to be doing fairly well. Overall I'm pleased with that. Nonetheless he states that this really has not seem to be improving. He has been tolerating the dressing changes without complication. The left anterior leg ulcer began on 04/05/18 according to what he tells [place] today although one note I did have for review said it was April 5. I'm inclined to believe it was the fifth she did have an office visit on 04/01/18 with his family nurse practitioner Faith Rogue. Nonetheless either way it's been going on this month for several weeks. The left great toe has actually been present since January 1 he does not know how this occurred. Fortunately has no pain at the site although he does have pain on the left anterior shin. His hemoglobin A1c is 8.0 in March 2019. 04/26/18 on evaluation today  patient presents for follow-up concerning his life into your lower [place] also as well as his left great toe also. Fortunately the shin actually appears to be doing better on evaluation today in my opinion I'm not seeing as much in the way of fluffernutter on the surface although it does still require debridement today this is improved. Size wise it's really not much different. With that being said the total ulcer actually appears to be a little bit more macerated today I'm not sure why they state that several days ago when this was changed that was not the case. Fortunately is not having severe pain although both areas do hurt the shin is worse. 05/03/18 on evaluation today patient appears to be doing better in regard to his left first toe and left shin ulcers. He is been tolerating the dressing changes without complication and the good news is this seems to be showing signs of getting better. Overall I'm very pleased with the progress that has been made over the past week. Patient is still having pain especially in regard to the left shin. 05/10/18 on evaluation today patient appears to be doing rather well in regard to his two ulcers. The toe ulcer appears to be smaller he does have some undermining however in this had to be cleaned out just a little bit. With that being said overall I feel like this is showing signs of improvement. The left anterior shin ulcer also showing signs of improvement at this time. There does not  appear to be any evidence of infection which is good news. 05/21/18 on evaluation today patient appears to be doing very well in regard to his left shin ulcer. He has been tolerating the dressing changes without complication. With that being said he is having issues at this point with his great toe on the left. We have not x-rayed that at this point I think we may need to. Nonetheless he has pain really at the roughly 5 o'clock location but nowhere else and I really cannot  explain this I do not see any obvious form body. Nonetheless he continues to have issues with getting this area to close it's not progressing as nicely. 05/28/18 on evaluation today patient appears to be doing very well in regard to his left lower extremity anterior ulcer. He has been tolerating the dressing changes without complication. Overall this is making excellent progress with the collagen dressing. His left great toe x-ray did return and showed that he had no if you that normality. He did have a plantar heel spur Delsol, Winfred T. ([id number]) but this is more degenerative. Otherwise just arthritis was noted no osteomyelitis and no obvious form body was visualized. Overall things seem to be going fairly well. With that being said he still has pain when looking at the toe from the plantar aspect at roughly the six back to the 4 o'clock location which is not quite as bad as last time but still hurts more than any other part of his wound. It does appear the Iodoflex has been of benefit however. 06/04/18 on evaluation today patient appears to be doing excellent in regard to his left anterior shin ulcer. This has made excellent progress and is showing signs of improving in fact very close to completely closing in healing. Nonetheless unfortunately his left great toe has not faired quite as well as far as progress is concerned. He notes that after last week's debridement he actually seem to be doing a little bit better for a couple of days until Thursday of last week when he had increases in discomfort as well is changes in the draining. He states that the drainage has been more green in color according to his wife and what he has seen. He's also had more discomfort. He relates to me again that he really feels like this all started when he stepped on a nail although he does not exactly remember it it seemed to be a puncture wound that he feels may be the culprit for why all this started. Nonetheless I  am getting more concerned about the possibility of osteomyelitis despite the fact that the x-ray was negative I was hopeful in this interim since last week to this week that we would see some improvement in the overall appearance of the toe. Unfortunately if anything I feel like it's a little worse. It was macerated but I think we can attribute this to the fact that the patient did have an episode last night we got stuck in the rain and he thinks the dressing got wet. He did not change it I told him in the future if this were to happen to please go ahead and change the dressing it won't hurt to change it more frequently in that situation. Otherwise other than the maceration I do not see any evidence of anything being any worse but it also does not appear to be any better. 06/11/18 on evaluation today patient's wounds both the great toe as well as the left shin  both on the left side show signs of good improvement. Fortunately the patient's culture came back negative in regard to the toe and there does not appear to be any evidence of infection. His MRI is actually scheduled for 06/19/18. Fortunately this is right before I see him next week and then we can go to the results of the culture at that point. Patient is in agreement with that plan. With that being said otherwise he has been showing signs of having less pain in regard to the toe which is also good news my hope is that he does not have osteomyelitis. Patient History Information obtained from Patient. Family History Cancer - Father, Diabetes - Father, Heart Disease - Mother,Father, Hypertension - Mother, Kidney Disease - Child, No family history of Lung Disease, Seizures, Stroke, Thyroid Problems, Tuberculosis. Social History Current every day smoker - 1 - 1/2 pack daily, Marital Status - Married, Alcohol Use - Rarely, Drug Use - No History, Caffeine Use - Daily - coffee. Review of Systems (ROS) Constitutional Symptoms (General  Health) Denies complaints or symptoms of Fever, Chills. Respiratory The patient has no complaints or symptoms. Cardiovascular The patient has no complaints or symptoms. Psychiatric The patient has no complaints or symptoms. Objective Ems, WALT GEATHERS. ([id number]) Constitutional Well-nourished and well-hydrated in no acute distress. Vitals Time Taken: 8:23 AM, Height: 69 in, Weight: 238 lbs, BMI: 35.1, Temperature: 97.8 F, Pulse: 76 bpm, Respiratory Rate: 18 breaths/min, Blood Pressure: 121/71 mmHg. Respiratory normal breathing without difficulty. clear to auscultation bilaterally. Cardiovascular regular rate and rhythm with normal S1, S2. trace pitting edema of the bilateral lower extremities. Psychiatric this patient is able to make decisions and demonstrates good insight into disease process. Alert and Oriented x 3. pleasant and cooperative. General Notes: Currently patient's wounds seem to show signs of making some progress and again I did not have to repeat any debridement today. Overall this is good news and I think show signs of good progress. The one key factor we need to make sure right now is that he does not have any evidence of osteomyelitis in this great toe. Integumentary (Hair, Skin) Wound #1 status is Open. Original cause of wound was Trauma. The wound is located on the Left,Midline,Anterior Lower Leg. The wound measures 0.6cm length x 0.5cm width x 0.1cm depth; 0.236cm^2 area and 0.024cm^3 volume. There is Fat Layer (Subcutaneous Tissue) Exposed exposed. There is no tunneling or undermining noted. There is a medium amount of serous drainage noted. The wound margin is distinct with the outline attached to the wound base. There is large (67-100%) red, hyper - granulation within the wound bed. There is a small (1-33%) amount of necrotic tissue within the wound bed including Adherent Slough. The periwound skin appearance exhibited: Hemosiderin Staining. The periwound skin  appearance did not exhibit: Callus, Crepitus, Excoriation, Induration, Rash, Scarring, Dry/Scaly, Maceration, Atrophie Blanche, Cyanosis, Ecchymosis, Mottled, Pallor, Rubor, Erythema. Periwound temperature was noted as No Abnormality. Wound #2 status is Open. Original cause of wound was Trauma. The wound is located on the Left Toe Great. The wound measures 0.3cm length x 0.5cm width x 0.3cm depth; 0.118cm^2 area and 0.035cm^3 volume. There is no tunneling or undermining noted. There is a medium amount of serous drainage noted. The wound margin is flat and intact. There is small (1-33%) pink granulation within the wound bed. There is a large (67-100%) amount of necrotic tissue within the wound bed including Adherent Slough. The periwound skin appearance exhibited: Callus. The periwound skin appearance did  not exhibit: Crepitus, Excoriation, Induration, Rash, Scarring, Dry/Scaly, Maceration, Atrophie Blanche, Cyanosis, Ecchymosis, Hemosiderin Staining, Mottled, Pallor, Rubor, Erythema. Periwound temperature was noted as No Abnormality. The periwound has tenderness on palpation. 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 Anesthetic (add to Medication List): Wound #1 Left,Midline,Anterior Lower Leg: Topical Lidocaine 4% cream applied to wound bed prior to  debridement (In Clinic Only). Wound #2 Left Toe Great: Topical Lidocaine 4% cream applied to wound bed prior to debridement (In Clinic Only). Primary Wound Dressing: Wound #1 Left,Midline,Anterior Lower Leg: Hydrogel Collagen Wound #2 Left Toe Great: Silver Alginate Secondary Dressing: Wound #1 Left,Midline,Anterior Lower Leg: ABD pad Non-adherent pad Wound #2 Left Toe Great: Gauze and Kerlix/Conform - secure with coban Foam Dressing Change Frequency: Wound #1 Left,Midline,Anterior Lower Leg: Change dressing every week Wound #2 Left Toe Great: Change dressing every other day. Follow-up Appointments: Wound #1 Left,Midline,Anterior Lower Leg: Return Appointment in 1 week. Nurse Visit as needed Wound #2 Left Toe Great: Return Appointment in 1 week. Nurse Visit as needed Edema Control: Wound #1 Left,Midline,Anterior Lower Leg: 3 Layer Compression System - Left Lower Extremity - unna to anchor Patient to wear own compression stockings - right lower leg Elevate legs to the level of the heart and pump ankles as often as possible Additional Orders / Instructions: Wound #1 Left,Midline,Anterior Lower Leg: Stop Smoking Vitamin A; Vitamin C, Zinc - Please add a multivitamin with 100% of these Increase protein intake. Activity as tolerated Wound #2 Left Toe Great: Stop Smoking Vitamin A; Vitamin C, Zinc - Please add a multivitamin with 100% of these Increase protein intake. Activity as tolerated The following medication(s) was prescribed: lidocaine topical 4 % cream 1 1 cream topical was prescribed at facility [place], Huntley Dec ([id number]) I am going to suggest currently that we continue with the Current wound care measures since things seem to be doing so well and the patient is in agreement the plan. We will subsequently see were things stand at follow-up in one weeks time. Please see above for specific wound care orders. We will see patient for re-evaluation in 1 week(s) here  in the clinic. If anything worsens or changes patient will contact our office for additional recommendations. Electronic Signature(s) Signed: 06/12/2018 7:52:50 AM By: Worthy Keeler PA-C Entered By: Worthy Keeler on 06/11/2018 17:46:41 [place], Huntley Dec ([id number]) -------------------------------------------------------------------------------- ROS/PFSH Details Patient Name: [name]. Date of Service: 06/11/2018 8:45 AM Medical Record Number: [id number] Patient Account Number: [id number] Date of Birth/Sex: 07/06/1951 (67 y.o. M) Treating RN: Ahmed Prima Primary Care Provider: BESS, KATY Other Clinician: Referring Provider: BESS, KATY Treating Provider/Extender: STONE III,  Weeks in Treatment: 7 Information Obtained From Patient Wound History Do you currently have one or more open woundso Yes How many open wounds do you currently haveo 2 Approximately how long have you had your woundso 2 weeks How have you been treating your wound(s) until nowo yes Doctor Has your wound(s) ever healed and  then re-openedo No Have you had any lab work done in the past montho No Have you tested positive for an antibiotic resistant organism (MRSA, VRE)o No Have you tested positive for osteomyelitis (bone infection)o No Have you had any tests for circulation on your legso No Have you had other problems associated with your woundso Infection, Swelling Constitutional Symptoms (General Health) Complaints and Symptoms: Negative for: Fever; Chills Eyes Medical History: Negative for: Cataracts; Glaucoma; Optic Neuritis Ear/Nose/Mouth/Throat Medical History: Negative for: Chronic sinus problems/congestion Hematologic/Lymphatic Medical History: Positive for: Lymphedema Negative for: Anemia; Hemophilia; Human Immunodeficiency Virus; Sickle Cell Disease Respiratory Complaints and Symptoms: No Complaints or Symptoms Medical History: Negative for: Aspiration; Asthma; Chronic Obstructive  Pulmonary Disease (COPD); Pneumothorax; Sleep Apnea; Tuberculosis Cardiovascular Complaints and Symptoms: No Complaints or Symptoms Medical History: LAVANTE, TOSO ([id number]) Positive for: Arrhythmia - pacemaker; Coronary Artery Disease; Hypertension; Myocardial Infarction Negative for: Deep Vein Thrombosis; Hypotension; Peripheral Arterial Disease; Peripheral Venous Disease; Phlebitis; Vasculitis Gastrointestinal Medical History: Negative for: Cirrhosis ; Colitis; Crohnos; Hepatitis A; Hepatitis B; Hepatitis C Endocrine Medical History: Positive for: Type II Diabetes Negative for: Type I Diabetes Time with diabetes: 5 years Treated with: Oral agents Blood sugar tested every day: Yes Tested : daily AM Blood sugar testing results: Breakfast: 174 Genitourinary Medical History: Negative for: End Stage Renal Disease Immunological Medical History: Negative for: Lupus Erythematosus; Raynaudos; Scleroderma Integumentary (Skin) Medical History: Negative for: History of Burn; History of pressure wounds Musculoskeletal Medical History: Negative for: Gout; Rheumatoid Arthritis; Osteoarthritis; Osteomyelitis Neurologic Medical History: Positive for: Neuropathy Negative for: Dementia; Quadriplegia; Paraplegia; Seizure Disorder Oncologic Medical History: Positive for: Received Chemotherapy; Received Radiation - Seeds prostate Psychiatric Complaints and Symptoms: No Complaints or Symptoms Medical History: Negative for: Anorexia/bulimia; Confinement Anxiety Cummings, BRAIAN TIJERINA ([id number]) Immunizations Pneumococcal Vaccine: Received Pneumococcal Vaccination: Yes Tetanus Vaccine: Last tetanus shot: 12/25/2017 Implantable Devices Family and Social History Cancer: Yes - Father; Diabetes: Yes - Father; Heart Disease: Yes - Mother,Father; Hypertension: Yes - Mother; Kidney Disease: Yes - Child; Lung Disease: No; Seizures: No; Stroke: No; Thyroid Problems: No; Tuberculosis: No;  Current every day smoker - 1 - 1/2 pack daily; Marital Status - Married; Alcohol Use: Rarely; Drug Use: No History; Caffeine Use: Daily - coffee; Financial Concerns: No; Food, Clothing or Shelter Needs: No; Support System Lacking: No; Transportation Concerns: No Physician Affirmation I have reviewed and agree with the above information. Electronic Signature(s) Signed: 06/12/2018 7:52:50 AM By: Worthy Keeler PA-C Signed: 06/13/2018 4:29:22 PM By: Alric Quan Entered By: Worthy Keeler on 06/11/2018 17:45:56 [place], Huntley Dec ([id number]) -------------------------------------------------------------------------------- SuperBill Details Patient Name: [name]. Date of Service: 06/11/2018 Medical Record Number: [id number] Patient Account Number: [id number] Date of Birth/Sex: [birth year] (67 y.o. M) Treating RN: Ahmed Prima Primary Care Provider: BESS, KATY Other Clinician: Referring Provider: BESS, KATY Treating Provider/Extender: STONE III,  Weeks in Treatment: 7 Diagnosis Coding ICD-10 Codes Code Description I87.2 Venous insufficiency (chronic) (peripheral) S81.812A Laceration without foreign body, left lower leg, initial encounter L97.822 Non-pressure chronic ulcer of other part of left lower leg with fat layer exposed L97.521 Non-pressure chronic ulcer of other part of left foot limited to breakdown of skin E11.621 Type 2 diabetes mellitus with foot ulcer I63.9 Cerebral infarction, unspecified I10 Essential (primary) hypertension Facility Procedures CPT4 Code: 24401027 Description: (Facility Use Only) 29581LT - APPLY MULTLAY COMPRS LWR LT LEG Modifier: Quantity: 1 Physician Procedures CPT4 Code Description: [id number] - WC PHYS LEVEL 3 - EST PT ICD-10 Diagnosis Description Q59.563O Laceration without foreign  body, left lower leg, initial encou I87.2 Venous insufficiency (chronic) (peripheral) L97.822 Non-pressure chronic ulcer of  other part of left lower  leg wit L97.521 Non-pressure chronic ulcer of other part of left foot limited Modifier: nter h fat layer expose to breakdown of sk Quantity: 1 d in Electronic Signature(s) Signed: 06/12/2018 7:52:50 AM By: Worthy Keeler PA-C Previous Signature: 06/11/2018 4:21:54 PM Version By: Alric Quan Entered By: Worthy Keeler on 06/11/2018 17:46:58

## 2018-06-17 ENCOUNTER — Ambulatory Visit (INDEPENDENT_AMBULATORY_CARE_PROVIDER_SITE_OTHER): Payer: PPO | Admitting: *Deleted

## 2018-06-17 ENCOUNTER — Ambulatory Visit
Admission: RE | Admit: 2018-06-17 | Discharge: 2018-06-17 | Disposition: A | Discharge status: Home / Self Care | Payer: PPO | Source: Ambulatory Visit | Attending: Physician Assistant | Admitting: Physician Assistant

## 2018-06-17 DIAGNOSIS — L97521 Non-pressure chronic ulcer of other part of left foot limited to breakdown of skin: Secondary | ICD-10-CM | POA: Diagnosis not present

## 2018-06-17 DIAGNOSIS — I63411 Cerebral infarction due to embolism of right middle cerebral artery: Secondary | ICD-10-CM

## 2018-06-17 DIAGNOSIS — L97529 Non-pressure chronic ulcer of other part of left foot with unspecified severity: Secondary | ICD-10-CM | POA: Diagnosis not present

## 2018-06-18 ENCOUNTER — Encounter: Payer: PPO | Admitting: Physician Assistant

## 2018-06-18 DIAGNOSIS — E11621 Type 2 diabetes mellitus with foot ulcer: Secondary | ICD-10-CM | POA: Diagnosis not present

## 2018-06-18 DIAGNOSIS — I872 Venous insufficiency (chronic) (peripheral): Secondary | ICD-10-CM | POA: Diagnosis not present

## 2018-06-18 DIAGNOSIS — L97522 Non-pressure chronic ulcer of other part of left foot with fat layer exposed: Secondary | ICD-10-CM | POA: Diagnosis not present

## 2018-06-19 NOTE — Progress Notes (Signed)
Carelink Summary Report / Loop Recorder 

## 2018-06-20 ENCOUNTER — Telehealth: Payer: Self-pay | Admitting: Adult Health

## 2018-06-20 ENCOUNTER — Ambulatory Visit (INDEPENDENT_AMBULATORY_CARE_PROVIDER_SITE_OTHER): Payer: PPO | Admitting: Adult Health

## 2018-06-20 ENCOUNTER — Encounter: Payer: Self-pay | Admitting: Adult Health

## 2018-06-20 VITALS — BP 112/69 | HR 79 | Ht 68.5 in | Wt 236.1 lb

## 2018-06-20 DIAGNOSIS — Z0000 Encounter for general adult medical examination without abnormal findings: Secondary | ICD-10-CM | POA: Diagnosis not present

## 2018-06-20 DIAGNOSIS — Z114 Encounter for screening for human immunodeficiency virus [HIV]: Secondary | ICD-10-CM | POA: Diagnosis not present

## 2018-06-20 DIAGNOSIS — Z136 Encounter for screening for cardiovascular disorders: Secondary | ICD-10-CM | POA: Diagnosis not present

## 2018-06-20 DIAGNOSIS — Z1159 Encounter for screening for other viral diseases: Secondary | ICD-10-CM | POA: Diagnosis not present

## 2018-06-20 DIAGNOSIS — E1169 Type 2 diabetes mellitus with other specified complication: Secondary | ICD-10-CM

## 2018-06-20 DIAGNOSIS — E669 Obesity, unspecified: Principal | ICD-10-CM

## 2018-06-20 MED ORDER — FREESTYLE LIBRE 14 DAY READER DEVI: 1.0000 | Freq: Every day | 0 refills | Status: DC

## 2018-06-20 MED ORDER — NICOTINE 21 MG/24HR TD PT24: 21.0000 mg | MEDICATED_PATCH | Freq: Every day | TRANSDERMAL | 0 refills | Status: DC

## 2018-06-20 MED ORDER — FREESTYLE LIBRE 14 DAY SENSOR MISC: 1.0000 | Freq: Every day | 1 refills | Status: DC

## 2018-06-20 NOTE — Progress Notes (Signed)
BURHAN, BARHAM ([id number]) Visit Report for 06/18/2018 Arrival Information Details Patient Name: KORVER, GRAYBEAL. Date of Service: 06/18/2018 8:30 AM Medical Record Number: [id number] Patient Account Number: [id number] Date of Birth/Sex: 13-Jun-1951 (67 y.o. M) Treating RN: Secundino Ginger Primary Care Anuar Walgren: BESS, KATY Other Clinician: Referring Kenni Newton: BESS, KATY Treating Tobin Cadiente/Extender: STONE III, HOYT Weeks in Treatment: 8 Visit Information History Since Last Visit Added or deleted any medications: No Patient Arrived: Ambulatory Any new allergies or adverse reactions: No Arrival Time: 08:25 Had a fall or experienced change in No Accompanied By: self activities of daily living that may affect Transfer Assistance: None risk of falls: Patient Identification Verified: Yes Signs or symptoms of abuse/neglect since last visito No Secondary Verification Process Completed: Yes Hospitalized since last visit: No Patient Has Alerts: Yes Implantable device outside of the clinic excluding No Patient Alerts: Type II Diabetic cellular tissue based products placed in the center since last visit: Has Dressing in Place as Prescribed: Yes Pain Present Now: No Electronic Signature(s) Signed: 06/18/2018 11:20:16 AM By: Secundino Ginger Entered By: Secundino Ginger on 06/18/2018 08:29:19 Krauser, Huntley Dec ([id number]) -------------------------------------------------------------------------------- Encounter Discharge Information Details Patient Name: [name] Date of Service: 06/18/2018 8:30 AM Medical Record Number: [id number] Patient Account Number: [id number] Date of Birth/Sex: Feb 14, 1951 (67 y.o. M) Treating RN: Roger Shelter Primary Care Jenavieve Freda: BESS, KATY Other Clinician: Referring Amiliah Campisi: BESS, KATY Treating Reya Aurich/Extender: Melburn Hake, HOYT Weeks in Treatment: 8 Encounter Discharge Information Items Discharge Condition: Stable Ambulatory Status: Ambulatory Discharge  Destination: Home Transportation: Private Auto Schedule Follow-up Appointment: Yes Clinical Summary of Care: Electronic Signature(s) Signed: 06/18/2018 4:47:28 PM By: Roger Shelter Entered By: Roger Shelter on 06/18/2018 09:36:33 Kable, Huntley Dec ([id number]) -------------------------------------------------------------------------------- Lower Extremity Assessment Details Patient Name: [name] Date of Service: 06/18/2018 8:30 AM Medical Record Number: [id number] Patient Account Number: [id number] Date of Birth/Sex: 31-Jan-1951 (67 y.o. M) Treating RN: Secundino Ginger Primary Care Keatin Benham: BESS, KATY Other Clinician: Referring Xzavien Harada: BESS, KATY Treating Jermond Burkemper/Extender: STONE III, HOYT Weeks in Treatment: 8 Edema Assessment Assessed: [Left: No] [Right: No] [Left: Edema] [Right: :] Calf Left: Right: Point of Measurement: 34 cm From Medial Instep 38 cm cm Ankle Left: Right: Point of Measurement: 11 cm From Medial Instep 23.5 cm cm Vascular Assessment Claudication: Claudication Assessment [Left:None] Pulses: Dorsalis Pedis Palpable: [Left:Yes] Posterior Tibial Extremity colors, hair growth, and conditions: Hair Growth on Extremity: [Left:No] Temperature of Extremity: [Left:Cool] Capillary Refill: [Left:< 3 seconds] Toe Nail Assessment Left: Right: Thick: Yes Discolored: Yes Deformed: Yes Improper Length and Hygiene: No Electronic Signature(s) Signed: 06/18/2018 11:20:16 AM By: Secundino Ginger Entered By: Secundino Ginger on 06/18/2018 08:47:50 Bass, Huntley Dec ([id number]) -------------------------------------------------------------------------------- Multi Wound Chart Details Patient Name: [name] Date of Service: 06/18/2018 8:30 AM Medical Record Number: [id number] Patient Account Number: [id number] Date of Birth/Sex: 07-Jan-1951 (67 y.o. M) Treating RN: Montey Hora Primary Care Ricke Kimoto: BESS, KATY Other Clinician: Referring Govanni Plemons: BESS,  KATY Treating Melizza Kanode/Extender: STONE III, HOYT Weeks in Treatment: 8 Vital Signs Height(in): 46 Pulse(bpm): 83 Weight(lbs): 238 Blood Pressure(mmHg): 122/63 Body Mass Index(BMI): 35 Temperature(F): 97.8 Respiratory Rate 16 (breaths/min): Photos: [N/A:N/A] Wound Location: Left Lower Leg - Midline, Left Toe Great N/A Anterior Wounding Event: Trauma Trauma N/A Primary Etiology: Diabetic Wound/Ulcer of the Diabetic Wound/Ulcer of the N/A Lower Extremity Lower Extremity Secondary Etiology: Trauma, Other Trauma, Other N/A Comorbid History: Lymphedema, Arrhythmia, Lymphedema, Arrhythmia, N/A Coronary Artery Disease, Coronary Artery Disease, Hypertension, Myocardial Hypertension, Myocardial Infarction, Type II Diabetes, Infarction, Type II Diabetes, Neuropathy, Received  Neuropathy, Received Chemotherapy, Received Chemotherapy, Received Radiation Radiation Date Acquired: 04/05/2018 12/25/2017 N/A Weeks of Treatment: 8 8 N/A Wound Status: Open Open N/A Pending Amputation on No Yes N/A Presentation: Measurements L x W x D 0.4x0.4x0.1 0.2x0.4x0.2 N/A (cm) Area (cm) : 0.126 0.063 N/A Volume (cm) : 0.013 0.013 N/A % Reduction in Area: 95.80% -34.00% N/A % Reduction in Volume: 95.60% -160.00% N/A Classification: Grade 1 Grade 1 N/A Exudate Amount: None Present None Present N/A Wound Margin: Distinct, outline attached Flat and Intact N/A Granulation Amount: Small (1-33%) None Present (0%) N/A Granulation Quality: Red, Hyper-granulation N/A N/A Necrotic Amount: None Present (0%) None Present (0%) N/A Exposed Structures: N/A Reinheimer, Huntley Dec ([id number]) Fat Layer (Subcutaneous Fascia: No Tissue) Exposed: Yes Fat Layer (Subcutaneous Tissue) Exposed: No Tendon: No Muscle: No Joint: No Bone: No Epithelialization: None Small (1-33%) N/A Periwound Skin Texture: Excoriation: No Callus: Yes N/A Induration: No Excoriation: No Callus: No Induration: No Crepitus: No Crepitus:  No Rash: No Rash: No Scarring: No Scarring: No Periwound Skin Moisture: Maceration: No Maceration: No N/A Dry/Scaly: No Dry/Scaly: No Periwound Skin Color: Hemosiderin Staining: Yes Atrophie Blanche: No N/A Atrophie Blanche: No Cyanosis: No Cyanosis: No Ecchymosis: No Ecchymosis: No Erythema: No Erythema: No Hemosiderin Staining: No Mottled: No Mottled: No Pallor: No Pallor: No Rubor: No Rubor: No Temperature: No Abnormality No Abnormality N/A Tenderness on Palpation: No Yes N/A Wound Preparation: Ulcer Cleansing: Ulcer Cleansing: N/A Rinsed/Irrigated with Saline Rinsed/Irrigated with Saline Topical Anesthetic Applied: Topical Anesthetic Applied: Other: lidocaine 4% Other: lidocaine 4% Treatment Notes Electronic Signature(s) Signed: 06/18/2018 5:42:13 PM By: Montey Hora Entered By: Montey Hora on 06/18/2018 09:14:16 Faulkner, Huntley Dec ([id number]) -------------------------------------------------------------------------------- Oriska Details Patient Name: [name] Date of Service: 06/18/2018 8:30 AM Medical Record Number: [id number] Patient Account Number: [id number] Date of Birth/Sex: Nov 27, 1951 (67 y.o. M) Treating RN: Montey Hora Primary Care Mazikeen Hehn: BESS, KATY Other Clinician: Referring Novelle Addair: BESS, KATY Treating Kita Neace/Extender: STONE III, HOYT Weeks in Treatment: 8 Active Inactive ` Abuse / Safety / Falls / Self Care Management Nursing Diagnoses: Potential for falls Goals: Patient will remain injury free related to falls Date Initiated: 04/19/2018 Target Resolution Date: 05/04/2018 Goal Status: Active Interventions: Assess fall risk on admission and as needed Notes: ` Nutrition Nursing Diagnoses: Impaired glucose control: actual or potential Goals: Patient/caregiver agrees to and verbalizes understanding of need to use nutritional supplements and/or vitamins as prescribed Date Initiated:  04/19/2018 Target Resolution Date: 06/07/2018 Goal Status: Active Patient/caregiver verbalizes understanding of need to maintain therapeutic glucose control per primary care physician Date Initiated: 04/19/2018 Target Resolution Date: 05/31/2018 Goal Status: Active Interventions: Assess HgA1c results as ordered upon admission and as needed Assess patient nutrition upon admission and as needed per policy Notes: ` Orientation to the Wound Care Program Nursing Diagnoses: Knowledge deficit related to the wound healing center program Goals: Patient/caregiver will verbalize understanding of the [place] TRAYCEN, GOYER ([id number]) Date Initiated: 04/19/2018 Target Resolution Date: 06/29/2018 Goal Status: Active Interventions: Provide education on orientation to the wound center Notes: ` Wound/Skin Impairment Nursing Diagnoses: Impaired tissue integrity Goals: Ulcer/skin breakdown will heal within 14 weeks Date Initiated: 04/19/2018 Target Resolution Date: 06/29/2018 Goal Status: Active Interventions: Assess patient/caregiver ability to obtain necessary supplies Assess patient/caregiver ability to perform ulcer/skin care regimen upon admission and as needed Assess ulceration(s) every visit Notes: Electronic Signature(s) Signed: 06/18/2018 5:42:13 PM By: Montey Hora Entered By: Montey Hora on 06/18/2018 09:13:41 Rivere, Huntley Dec ([id number]) -------------------------------------------------------------------------------- Pain Assessment Details  Patient Name: ROE, WILNER. Date of Service: 06/18/2018 8:30 AM Medical Record Number: [id number] Patient Account Number: [id number] Date of Birth/Sex: 23-Aug-1951 (67 y.o. M) Treating RN: Secundino Ginger Primary Care Gaylon Bentz: BESS, Valetta Fuller Other Clinician: Referring Monique Hefty: BESS, KATY Treating Margorie Renner/Extender: STONE III, HOYT Weeks in Treatment: 8 Active Problems Location of Pain Severity and Description of  Pain Patient Has Paino No Site Locations Pain Management and Medication Current Pain Management: Goals for Pain Management Topical or injectable lidocaine is offered to patient for acute pain when surgical debridement is performed. If needed, Patient is instructed to use over the counter pain medication for the following 24-48 hours after debridement. Wound care MDs do not prescribed pain medications. Patient has chronic pain or uncontrolled pain. Patient has been instructed to make an appointment with their Primary Care Physician for pain management. Electronic Signature(s) Signed: 06/18/2018 11:20:16 AM By: Secundino Ginger Entered By: Secundino Ginger on 06/18/2018 08:32:21 [name] ([id number]) -------------------------------------------------------------------------------- Patient/Caregiver Education Details Patient Name: [name] Date of Service: 06/18/2018 8:30 AM Medical Record Number: [id number] Patient Account Number: [id number] Date of Birth/Gender: 05-29-1951 (67 y.o. M) Treating RN: Roger Shelter Primary Care Physician: BESS, KATY Other Clinician: Referring Physician: Lillard Anes Treating Physician/Extender: Melburn Hake, HOYT Weeks in Treatment: 8 Education Assessment Education Provided To: Patient Education Topics Provided Wound Debridement: Handouts: Wound Debridement Methods: Explain/Verbal Responses: State content correctly Wound/Skin Impairment: Handouts: Caring for Your Ulcer Methods: Explain/Verbal Responses: State content correctly Electronic Signature(s) Signed: 06/18/2018 4:47:28 PM By: Roger Shelter Entered By: Roger Shelter on 06/18/2018 09:36:47 Townsel, Huntley Dec ([id number]) -------------------------------------------------------------------------------- Wound Assessment Details Patient Name: [name] Date of Service: 06/18/2018 8:30 AM Medical Record Number: [id number] Patient Account Number: [id number] Date of Birth/Sex: 02/16/1951  (67 y.o. M) Treating RN: Secundino Ginger Primary Care Jahseh Lucchese: BESS, KATY Other Clinician: Referring Aalijah Mims: BESS, KATY Treating Stormee Duda/Extender: STONE III, HOYT Weeks in Treatment: 8 Wound Status Wound Number: 1 Primary Diabetic Wound/Ulcer of the Lower Extremity Etiology: Wound Location: Left Lower Leg - Midline, Anterior Secondary Trauma, Other Wounding Event: Trauma Etiology: Date Acquired: 04/05/2018 Wound Open Weeks Of Treatment: 8 Status: Clustered Wound: No Comorbid Lymphedema, Arrhythmia, Coronary Artery History: Disease, Hypertension, Myocardial Infarction, Type II Diabetes, Neuropathy, Received Chemotherapy, Received Radiation Photos Photo Uploaded By: Secundino Ginger on 06/18/2018 08:54:56 Wound Measurements Length: (cm) 0.4 Width: (cm) 0.4 Depth: (cm) 0.1 Area: (cm) 0.126 Volume: (cm) 0.013 % Reduction in Area: 95.8% % Reduction in Volume: 95.6% Epithelialization: None Tunneling: No Undermining: No Wound Description Classification: Grade 1 Wound Margin: Distinct, outline attached Exudate Amount: None Present Foul Odor After Cleansing: No Slough/Fibrino No Wound Bed Granulation Amount: Small (1-33%) Exposed Structure Granulation Quality: Red, Hyper-granulation Fat Layer (Subcutaneous Tissue) Exposed: Yes Necrotic Amount: None Present (0%) Periwound Skin Texture Texture Color No Abnormalities Noted: No No Abnormalities Noted: No Callus: No Atrophie Blanche: No Crepitus: No Cyanosis: No Excoriation: No Ecchymosis: No Induration: No Erythema: No Weisenberger, HALDON CARLEY. ([id number]) Rash: No Hemosiderin Staining: Yes Scarring: No Mottled: No Pallor: No Moisture Rubor: No No Abnormalities Noted: No Dry / Scaly: No Temperature / Pain Maceration: No Temperature: No Abnormality Wound Preparation Ulcer Cleansing: Rinsed/Irrigated with Saline Topical Anesthetic Applied: Other: lidocaine 4%, Treatment Notes Wound #1 (Left, Midline, Anterior Lower  Leg) 1. Cleansed with: Clean wound with Normal Saline 2. Anesthetic Topical Lidocaine 4% cream to wound bed prior to debridement 3. Peri-wound Care: Moisturizing lotion 4. Dressing Applied: Hydrogel Prisma Ag 5. Secondary Dressing Applied Dry Gauze 7. Secured with 3 Layer  Compression System - Left Lower Extremity Notes Unna to secure at top and bottom Electronic Signature(s) Signed: 06/18/2018 11:20:16 AM By: Secundino Ginger Entered By: Secundino Ginger on 06/18/2018 08:44:08 Tallerico, Huntley Dec ([id number]) -------------------------------------------------------------------------------- Wound Assessment Details Patient Name: [name] Date of Service: 06/18/2018 8:30 AM Medical Record Number: [id number] Patient Account Number: [id number] Date of Birth/Sex: 1951-06-09 (67 y.o. M) Treating RN: Secundino Ginger Primary Care Jeramie Scogin: BESS, KATY Other Clinician: Referring Teniyah Seivert: BESS, KATY Treating Durk Carmen/Extender: STONE III, HOYT Weeks in Treatment: 8 Wound Status Wound Number: 2 Primary Diabetic Wound/Ulcer of the Lower Extremity Etiology: Wound Location: Left Toe Great Secondary Trauma, Other Wounding Event: Trauma Etiology: Date Acquired: 12/25/2017 Wound Open Weeks Of Treatment: 8 Status: Clustered Wound: No Comorbid Lymphedema, Arrhythmia, Coronary Artery Pending Amputation On Presentation History: Disease, Hypertension, Myocardial Infarction, Type II Diabetes, Neuropathy, Received Chemotherapy, Received Radiation Photos Photo Uploaded By: Secundino Ginger on 06/18/2018 08:54:56 Wound Measurements Length: (cm) 0.2 Width: (cm) 0.4 Depth: (cm) 0.2 Area: (cm) 0.063 Volume: (cm) 0.013 % Reduction in Area: -34% % Reduction in Volume: -160% Epithelialization: Small (1-33%) Tunneling: No Undermining: No Wound Description Classification: Grade 1 Foul Odor Wound Margin: Flat and Intact Slough/Fi Exudate Amount: None Present After Cleansing: No brino Yes Wound  Bed Granulation Amount: None Present (0%) Exposed Structure Necrotic Amount: None Present (0%) Fascia Exposed: No Fat Layer (Subcutaneous Tissue) Exposed: No Tendon Exposed: No Muscle Exposed: No Joint Exposed: No Bone Exposed: No Periwound Skin Texture Texture Color No Abnormalities Noted: No No Abnormalities Noted: No Dia, Huntley Dec ([id number]) Callus: Yes Atrophie Blanche: No Crepitus: No Cyanosis: No Excoriation: No Ecchymosis: No Induration: No Erythema: No Rash: No Hemosiderin Staining: No Scarring: No Mottled: No Pallor: No Moisture Rubor: No No Abnormalities Noted: No Dry / Scaly: No Temperature / Pain Maceration: No Temperature: No Abnormality Tenderness on Palpation: Yes Wound Preparation Ulcer Cleansing: Rinsed/Irrigated with Saline Topical Anesthetic Applied: Other: lidocaine 4%, Treatment Notes Wound #2 (Left Toe Great) 1. Cleansed with: Clean wound with Normal Saline 2. Anesthetic Topical Lidocaine 4% cream to wound bed prior to debridement 4. Dressing Applied: Other dressing (specify in notes) Notes silvercell, foam with cut out, gauze then wrap with conform wrap with tape Electronic Signature(s) Signed: 06/18/2018 11:20:16 AM By: Secundino Ginger Entered By: Secundino Ginger on 06/18/2018 08:45:47 Galati, Huntley Dec ([id number]) -------------------------------------------------------------------------------- [place] Details Patient Name: [name] Date of Service: 06/18/2018 8:30 AM Medical Record Number: [id number] Patient Account Number: [id number] Date of Birth/Sex: 1951/01/23 (67 y.o. M) Treating RN: Secundino Ginger Primary Care Lucca Greggs: BESS, KATY Other Clinician: Referring Venus Gilles: BESS, KATY Treating Mardel Grudzien/Extender: STONE III, HOYT Weeks in Treatment: 8 Vital Signs Time Taken: 08:32 Temperature (F): 97.8 Height (in): 69 Pulse (bpm): 83 Weight (lbs): 238 Respiratory Rate (breaths/min): 16 Body Mass Index (BMI): 35.1 Blood Pressure  (mmHg): 122/63 Reference Range: 80 - 120 mg / dl Electronic Signature(s) Signed: 06/18/2018 11:20:16 AM By: Secundino Ginger Entered BySecundino Ginger on 06/18/2018 08:32:54

## 2018-06-20 NOTE — Telephone Encounter (Signed)
Spoke with pt who is agreeable to trying Nicoderm first.  RX sent to pharmacy.  Charyl Bigger, CMA

## 2018-06-20 NOTE — Telephone Encounter (Signed)
Patient is requesting smoking cessation meds, he meant to touch base with you about it today. Please advise and if approved please send to [facility] on [address].

## 2018-06-20 NOTE — Telephone Encounter (Signed)
Does he want to start with Nicoderm patch first? Please call and confirm and if so, please send in Nicoderm patch- 21mg  patch QD, 6 week supply Thanks! Valetta Fuller

## 2018-06-20 NOTE — Progress Notes (Signed)
DRYDEN, TAPLEY ([id number]) Visit Report for 06/18/2018 Chief Complaint Document Details Patient Name: [name], [name]. Date of Service: 06/18/2018 8:30 AM Medical Record Number: [id number] Patient Account Number: [id number] Date of Birth/Sex: 1951/08/04 (67 y.o. M) Treating RN: Ahmed Prima Primary Care Provider: BESS, Valetta Fuller Other Clinician: Referring Provider: BESS, KATY Treating Provider/Extender: STONE III, Shamel Germond Weeks in Treatment: 8 Information Obtained from: Patient Chief Complaint Left lower leg and left great toe ulcer Electronic Signature(s) Signed: 06/19/2018 12:21:38 AM By: Worthy Keeler PA-C Entered By: Worthy Keeler on 06/18/2018 08:30:21 Hausman, Huntley Dec ([id number]) -------------------------------------------------------------------------------- Debridement Details Patient Name: [name]. Date of Service: 06/18/2018 8:30 AM Medical Record Number: [id number] Patient Account Number: [id number] Date of Birth/Sex: [birth year] (67 y.o. M) Treating RN: Montey Hora Primary Care Provider: BESS, KATY Other Clinician: Referring Provider: BESS, KATY Treating Provider/Extender: STONE III, Khadija Thier Weeks in Treatment: 8 Debridement Performed for Wound #2 Left Toe Great Assessment: Performed By: Physician STONE III, Shonna Deiter E., PA-C Debridement Type: Debridement Severity of Tissue Pre Fat layer exposed Debridement: Pre-procedure Verification/Time Yes - 09:14 Out Taken: Start Time: 09:14 Pain Control: Lidocaine 4% Topical Solution Total Area Debrided (L x W): 0.2 (cm) x 0.4 (cm) = 0.08 (cm) Tissue and other material Viable, Non-Viable, Subcutaneous, Fibrin/Exudate debrided: Level: Skin/Subcutaneous Tissue Debridement Description: Excisional Instrument: Curette Bleeding: Minimum Hemostasis Achieved: Pressure End Time: 09:16 Procedural Pain: 0 Post Procedural Pain: 0 Response to Treatment: Procedure was tolerated well Level of Consciousness: Awake and  Alert Post Procedure Vitals: Temperature: 97.8 Pulse: 83 Respiratory Rate: 18 Blood Pressure: Systolic Blood Pressure: 222 Diastolic Blood Pressure: 63 Post Debridement Measurements of Total Wound Length: (cm) 0.2 Width: (cm) 0.4 Depth: (cm) 0.3 Volume: (cm) 0.019 Character of Wound/Ulcer Post Debridement: Improved Severity of Tissue Post Debridement: Fat layer exposed Post Procedure Diagnosis Same as Pre-procedure Electronic Signature(s) Signed: 06/18/2018 5:42:13 PM By: Montey Hora Signed: 06/19/2018 12:21:38 AM By: Ralene Bathe ([id number]) Entered By: Montey Hora on 06/18/2018 09:18:03 Torregrossa, Huntley Dec ([id number]) -------------------------------------------------------------------------------- HPI Details Patient Name: [name]. Date of Service: 06/18/2018 8:30 AM Medical Record Number: [id number] Patient Account Number: [id number] Date of Birth/Sex: 04-17-1951 (67 y.o. M) Treating RN: Ahmed Prima Primary Care Provider: BESS, KATY Other Clinician: Referring Provider: BESS, KATY Treating Provider/Extender: STONE III, Nan Maya Weeks in Treatment: 8 History of Present Illness Associated Signs and Symptoms: Patient has a history of chronic venous insufficiency, diabetes mellitus type II, its reform fraction, hypertension HPI Description: 04/19/18 on evaluation today patient presents initially concerning an ulcer of his left great toe and left anterior shin. He states that the shin was definitely a trauma injury where he struck this on a metal pole injuring leg. In regard to the distal left great toe he's really not sure what happened here although he does have a significant callous noted as well. He does have a pacemaker. Fortunately his ABI was 0.98 on the right and 0.83 on the left this appears to be doing fairly well. Overall I'm pleased with that. Nonetheless he states that this really has not seem to be improving. He has been  tolerating the dressing changes without complication. The left anterior leg ulcer began on 04/05/18 according to what he tells [place] today although one note I did have for review said it was April 5. I'm inclined to believe it was the fifth she did have an office visit on 04/01/18 with his family nurse practitioner Faith Rogue. Nonetheless either way it's been going on  this month for several weeks. The left great toe has actually been present since January 1 he does not know how this occurred. Fortunately has no pain at the site although he does have pain on the left anterior shin. His hemoglobin A1c is 8.0 in March 2019. 04/26/18 on evaluation today patient presents for follow-up concerning his life into your [place] also as well as his left great toe also. Fortunately the shin actually appears to be doing better on evaluation today in my opinion I'm not seeing as much in the way of fluffernutter on the surface although it does still require debridement today this is improved. Size wise it's really not much different. With that being said the total ulcer actually appears to be a little bit more macerated today I'm not sure why they state that several days ago when this was changed that was not the case. Fortunately is not having severe pain although both areas do hurt the shin is worse. 05/03/18 on evaluation today patient appears to be doing better in regard to his left first toe and left shin ulcers. He is been tolerating the dressing changes without complication and the good news is this seems to be showing signs of getting better. Overall I'm very pleased with the progress that has been made over the past week. Patient is still having pain especially in regard to the left shin. 05/10/18 on evaluation today patient appears to be doing rather well in regard to his two ulcers. The toe ulcer appears to be smaller he does have some undermining however in this had to be cleaned out just a little bit.  With that being said overall I feel like this is showing signs of improvement. The left anterior shin ulcer also showing signs of improvement at this time. There does not appear to be any evidence of infection which is good news. 05/21/18 on evaluation today patient appears to be doing very well in regard to his left shin ulcer. He has been tolerating the dressing changes without complication. With that being said he is having issues at this point with his great toe on the left. We have not x-rayed that at this point I think we may need to. Nonetheless he has pain really at the roughly 5 o'clock location but nowhere else and I really cannot explain this I do not see any obvious form body. Nonetheless he continues to have issues with getting this area to close it's not progressing as nicely. 05/28/18 on evaluation today patient appears to be doing very well in regard to his left lower extremity anterior ulcer. He has been tolerating the dressing changes without complication. Overall this is making excellent progress with the collagen dressing. His left great toe x-ray did return and showed that he had no if you that normality. He did have a plantar heel spur but this is more degenerative. Otherwise just arthritis was noted no osteomyelitis and no obvious form body was visualized. Overall things seem to be going fairly well. With that being said he still has pain when looking at the toe from the plantar aspect at roughly the six back to the 4 o'clock location which is not quite as bad as last time but still hurts more than any other part of his wound. It does appear the Iodoflex has been of benefit however. 06/04/18 on evaluation today patient appears to be doing excellent in regard to his left anterior shin ulcer. This has made excellent progress and is showing signs  of improving in fact very close to completely closing in healing. Nonetheless unfortunately his left great toe has not faired quite as well  as far as progress is concerned. He notes that after last week's Grothaus, BAYLON SANTELLI. ([id number]) debridement he actually seem to be doing a little bit better for a couple of days until Thursday of last week when he had increases in discomfort as well is changes in the draining. He states that the drainage has been more green in color according to his wife and what he has seen. He's also had more discomfort. He relates to me again that he really feels like this all started when he stepped on a nail although he does not exactly remember it it seemed to be a puncture wound that he feels may be the culprit for why all this started. Nonetheless I am getting more concerned about the possibility of osteomyelitis despite the fact that the x-ray was negative I was hopeful in this interim since last week to this week that we would see some improvement in the overall appearance of the toe. Unfortunately if anything I feel like it's a little worse. It was macerated but I think we can attribute this to the fact that the patient did have an episode last night we got stuck in the rain and he thinks the dressing got wet. He did not change it I told him in the future if this were to happen to please go ahead and change the dressing it won'[name] hurt to change it more frequently in that situation. Otherwise other than the maceration I do not see any evidence of anything being any worse but it also does not appear to be any better. 06/11/18 on evaluation today patient's wounds both the great toe as well as the left shin both on the left side show signs of good improvement. Fortunately the patient's culture came back negative in regard to the toe and there does not appear to be any evidence of infection. His MRI is actually scheduled for 06/19/18. Fortunately this is right before I see him next week and then we can go to the results of the culture at that point. Patient is in agreement with that plan. With that being said  otherwise he has been showing signs of having less pain in regard to the toe which is also good news my hope is that he does not have osteomyelitis. 06/18/18 on evaluation today patient appears to be doing very well in regard to his left lower extremity ulcer. He also states at this point in time that he has been tolerating the dressing changes with the toe it appears also to be doing very well at this point. In general I'm happy with the progress that he seems to be making. With that being said he still continues to have pain in regard to the great toe ulcer fortunately this does not seem to be as significant as it was in the beginning but still nonetheless he is having some pain. No fevers, chills, nausea, or vomiting noted at this time. Electronic Signature(s) Signed: 06/19/2018 12:21:38 AM By: Worthy Keeler PA-C Entered By: Worthy Keeler on 06/18/2018 09:23:49 Koos, Huntley Dec ([id number]) -------------------------------------------------------------------------------- Physical Exam Details Patient Name: [name]. Date of Service: 06/18/2018 8:30 AM Medical Record Number: [id number] Patient Account Number: [id number] Date of Birth/Sex: 29-Mar-1951 (67 y.o. M) Treating RN: Ahmed Prima Primary Care Provider: BESS, Valetta Fuller Other Clinician: Referring Provider: BESS, KATY Treating Provider/Extender: STONE III, Lorrinda Ramstad  Weeks in Treatment: 8 Constitutional Well-nourished and well-hydrated in no acute distress. Respiratory normal breathing without difficulty. Cardiovascular 1+ pitting edema of the bilateral lower extremities. Psychiatric this patient is able to make decisions and demonstrates good insight into disease process. Alert and Oriented x 3. pleasant and cooperative. Notes Patient's wound in regard to his left lower extremity is almost completely healed and appears to be doing very well. In regard to the left great toe he shows signs of good improvement in regard to the  wound size and wound bed quality nonetheless he is still struggling in order to get this to fully close. Nonetheless the good news is his MRI was reviewed and was negative for osteomyelitis receptive joint the only funding was that there was an ulcer noted which of course we Artie knew. Nonetheless there did not appear to be any obvious form body that was noted at this point. Electronic Signature(s) Signed: 06/19/2018 12:21:38 AM By: Worthy Keeler PA-C Entered By: Worthy Keeler on 06/18/2018 09:24:33 Herald, Huntley Dec ([id number]) -------------------------------------------------------------------------------- Physician Orders Details Patient Name: [name]. Date of Service: 06/18/2018 8:30 AM Medical Record Number: [id number] Patient Account Number: [id number] Date of Birth/Sex: 03/02/1951 (67 y.o. M) Treating RN: Montey Hora Primary Care Provider: BESS, KATY Other Clinician: Referring Provider: BESS, KATY Treating Provider/Extender: Melburn Hake, Teasia Zapf Weeks in Treatment: 8 Verbal / Phone Orders: No Diagnosis Coding ICD-10 Coding Code Description I87.2 Venous insufficiency (chronic) (peripheral) S81.812A Laceration without foreign body, left lower leg, initial encounter L97.822 Non-pressure chronic ulcer of other part of left lower leg with fat layer exposed L97.521 Non-pressure chronic ulcer of other part of left foot limited to breakdown of skin E11.621 Type 2 diabetes mellitus with foot ulcer I63.9 Cerebral infarction, unspecified I10 Essential (primary) hypertension Wound Cleansing Wound #1 Left,Midline,Anterior Lower Leg o Cleanse wound with mild soap and water Wound #2 Left Toe Great o Cleanse wound with mild soap and water o May Shower, gently pat wound dry prior to applying new dressing. Anesthetic (add to Medication List) Wound #1 Left,Midline,Anterior Lower Leg o Topical Lidocaine 4% cream applied to wound bed prior to debridement (In Clinic  Only). Wound #2 Left Toe Great o Topical Lidocaine 4% cream applied to wound bed prior to debridement (In Clinic Only). Primary Wound Dressing Wound #1 Left,Midline,Anterior Lower Leg o Hydrogel o Collagen Wound #2 Left Toe Great o Silver Alginate Secondary Dressing Wound #1 Left,Midline,Anterior Lower Leg o ABD pad o Non-adherent pad Wound #2 Left Toe Great o Gauze and Kerlix/Conform - secure with coban o Foam Jambor, Huntley Dec ([id number]) Dressing Change Frequency Wound #1 Left,Midline,Anterior Lower Leg o Change dressing every week Wound #2 Left Toe Great o Change dressing every other day. Follow-up Appointments Wound #1 Left,Midline,Anterior Lower Leg o Return Appointment in 1 week. o Nurse Visit as needed Wound #2 Left Toe Great o Return Appointment in 1 week. o Nurse Visit as needed Edema Control Wound #1 Left,Midline,Anterior Lower Leg o 3 Layer Compression System - Left Lower Extremity - unna to anchor o Patient to wear own compression stockings - right lower leg o Elevate legs to the level of the heart and pump ankles as often as possible Additional Orders / Instructions Wound #1 Left,Midline,Anterior Lower Leg o Stop Smoking o Vitamin A; Vitamin C, Zinc - Please add a multivitamin with 100% of these o Increase protein intake. o Activity as tolerated Wound #2 Left Toe Great o Stop Smoking o Vitamin A; Vitamin C, Zinc - Please add  a multivitamin with 100% of these o Increase protein intake. o Activity as tolerated Electronic Signature(s) Signed: 06/18/2018 5:42:13 PM By: Montey Hora Signed: 06/19/2018 12:21:38 AM By: Worthy Keeler PA-C Entered By: Montey Hora on 06/18/2018 09:19:09 Erbe, Huntley Dec ([id number]) -------------------------------------------------------------------------------- Problem List Details Patient Name: [name]. Date of Service: 06/18/2018 8:30 AM Medical Record Number:  [id number] Patient Account Number: [id number] Date of Birth/Sex: March 23, 1951 (67 y.o. M) Treating RN: Ahmed Prima Primary Care Provider: BESS, KATY Other Clinician: Referring Provider: BESS, KATY Treating Provider/Extender: Melburn Hake, Jakeya Gherardi Weeks in Treatment: 8 Active Problems ICD-10 Evaluated Encounter Code Description Active Date Today Diagnosis I87.2 Venous insufficiency (chronic) (peripheral) 04/19/2018 No Yes S81.812A Laceration without foreign body, left lower leg, initial 04/19/2018 No Yes encounter L97.822 Non-pressure chronic ulcer of other part of left lower leg with 04/19/2018 No Yes fat layer exposed L97.521 Non-pressure chronic ulcer of other part of left foot limited to 04/19/2018 No Yes breakdown of skin E11.621 Type 2 diabetes mellitus with foot ulcer 04/19/2018 No Yes I63.9 Cerebral infarction, unspecified 04/19/2018 No Yes I10 Essential (primary) hypertension 04/19/2018 No Yes Inactive Problems Resolved Problems Electronic Signature(s) Signed: 06/19/2018 12:21:38 AM By: Worthy Keeler PA-C Entered By: Worthy Keeler on 06/18/2018 08:27:38 Disanti, Huntley Dec ([id number]) -------------------------------------------------------------------------------- Progress Note Details Patient Name: [name]. Date of Service: 06/18/2018 8:30 AM Medical Record Number: [id number] Patient Account Number: [id number] Date of Birth/Sex: [birth year] (67 y.o. M) Treating RN: Ahmed Prima Primary Care Provider: BESS, KATY Other Clinician: Referring Provider: BESS, KATY Treating Provider/Extender: STONE III, Laurie Penado Weeks in Treatment: 8 Subjective Chief Complaint Information obtained from Patient Left lower leg and left great toe ulcer History of Present Illness (HPI) The following HPI elements were documented for the patient's wound: Associated Signs and Symptoms: Patient has a history of chronic venous insufficiency, diabetes mellitus type II, its reform fraction,  hypertension 04/19/18 on evaluation today patient presents initially concerning an ulcer of his left great toe and left anterior shin. He states that the shin was definitely a trauma injury where he struck this on a metal pole injuring leg. In regard to the distal left great toe he's really not sure what happened here although he does have a significant callous noted as well. He does have a pacemaker. Fortunately his ABI was 0.98 on the right and 0.83 on the left this appears to be doing fairly well. Overall I'm pleased with that. Nonetheless he states that this really has not seem to be improving. He has been tolerating the dressing changes without complication. The left anterior leg ulcer began on 04/05/18 according to what he tells [place] today although one note I did have for review said it was April 5. I'm inclined to believe it was the fifth she did have an office visit on 04/01/18 with his family nurse practitioner Faith Rogue. Nonetheless either way it's been going on this month for several weeks. The left great toe has actually been present since January 1 he does not know how this occurred. Fortunately has no pain at the site although he does have pain on the left anterior shin. His hemoglobin A1c is 8.0 in March 2019. 04/26/18 on evaluation today patient presents for follow-up concerning his life into your [place] also as well as his left great toe also. Fortunately the shin actually appears to be doing better on evaluation today in my opinion I'm not seeing as much in the way of fluffernutter on the surface although it does still  require debridement today this is improved. Size wise it's really not much different. With that being said the total ulcer actually appears to be a little bit more macerated today I'm not sure why they state that several days ago when this was changed that was not the case. Fortunately is not having severe pain although both areas do hurt the shin is  worse. 05/03/18 on evaluation today patient appears to be doing better in regard to his left first toe and left shin ulcers. He is been tolerating the dressing changes without complication and the good news is this seems to be showing signs of getting better. Overall I'm very pleased with the progress that has been made over the past week. Patient is still having pain especially in regard to the left shin. 05/10/18 on evaluation today patient appears to be doing rather well in regard to his two ulcers. The toe ulcer appears to be smaller he does have some undermining however in this had to be cleaned out just a little bit. With that being said overall I feel like this is showing signs of improvement. The left anterior shin ulcer also showing signs of improvement at this time. There does not appear to be any evidence of infection which is good news. 05/21/18 on evaluation today patient appears to be doing very well in regard to his left shin ulcer. He has been tolerating the dressing changes without complication. With that being said he is having issues at this point with his great toe on the left. We have not x-rayed that at this point I think we may need to. Nonetheless he has pain really at the roughly 5 o'clock location but nowhere else and I really cannot explain this I do not see any obvious form body. Nonetheless he continues to have issues with getting this area to close it's not progressing as nicely. 05/28/18 on evaluation today patient appears to be doing very well in regard to his left lower extremity anterior ulcer. He has been tolerating the dressing changes without complication. Overall this is making excellent progress with the collagen dressing. His left great toe x-ray did return and showed that he had no if you that normality. He did have a plantar heel spur Cribb, Jyaire [name]. ([id number]) but this is more degenerative. Otherwise just arthritis was noted no osteomyelitis and no obvious  form body was visualized. Overall things seem to be going fairly well. With that being said he still has pain when looking at the toe from the plantar aspect at roughly the six back to the 4 o'clock location which is not quite as bad as last time but still hurts more than any other part of his wound. It does appear the Iodoflex has been of benefit however. 06/04/18 on evaluation today patient appears to be doing excellent in regard to his left anterior shin ulcer. This has made excellent progress and is showing signs of improving in fact very close to completely closing in healing. Nonetheless unfortunately his left great toe has not faired quite as well as far as progress is concerned. He notes that after last week's debridement he actually seem to be doing a little bit better for a couple of days until Thursday of last week when he had increases in discomfort as well is changes in the draining. He states that the drainage has been more green in color according to his wife and what he has seen. He's also had more discomfort. He relates to me again  that he really feels like this all started when he stepped on a nail although he does not exactly remember it it seemed to be a puncture wound that he feels may be the culprit for why all this started. Nonetheless I am getting more concerned about the possibility of osteomyelitis despite the fact that the x-ray was negative I was hopeful in this interim since last week to this week that we would see some improvement in the overall appearance of the toe. Unfortunately if anything I feel like it's a little worse. It was macerated but I think we can attribute this to the fact that the patient did have an episode last night we got stuck in the rain and he thinks the dressing got wet. He did not change it I told him in the future if this were to happen to please go ahead and change the dressing it won'[name] hurt to change it more frequently in that situation.  Otherwise other than the maceration I do not see any evidence of anything being any worse but it also does not appear to be any better. 06/11/18 on evaluation today patient's wounds both the great toe as well as the left shin both on the left side show signs of good improvement. Fortunately the patient's culture came back negative in regard to the toe and there does not appear to be any evidence of infection. His MRI is actually scheduled for 06/19/18. Fortunately this is right before I see him next week and then we can go to the results of the culture at that point. Patient is in agreement with that plan. With that being said otherwise he has been showing signs of having less pain in regard to the toe which is also good news my hope is that he does not have osteomyelitis. 06/18/18 on evaluation today patient appears to be doing very well in regard to his left lower extremity ulcer. He also states at this point in time that he has been tolerating the dressing changes with the toe it appears also to be doing very well at this point. In general I'm happy with the progress that he seems to be making. With that being said he still continues to have pain in regard to the great toe ulcer fortunately this does not seem to be as significant as it was in the beginning but still nonetheless he is having some pain. No fevers, chills, nausea, or vomiting noted at this time. Patient History Information obtained from Patient. Family History Cancer - Father, Diabetes - Father, Heart Disease - Mother,Father, Hypertension - Mother, Kidney Disease - Child, No family history of Lung Disease, Seizures, Stroke, Thyroid Problems, Tuberculosis. Social History Current every day smoker - 1 - 1/2 pack daily, Marital Status - Married, Alcohol Use - Rarely, Drug Use - No History, Caffeine Use - Daily - coffee. Review of Systems (ROS) Constitutional Symptoms (General Health) Denies complaints or symptoms of Fever,  Chills. Respiratory The patient has no complaints or symptoms. Cardiovascular Complains or has symptoms of LE edema. Psychiatric The patient has no complaints or symptoms. LUAN, MABERRY ([id number]) Objective Constitutional Well-nourished and well-hydrated in no acute distress. Vitals Time Taken: 8:32 AM, Height: 69 in, Weight: 238 lbs, BMI: 35.1, Temperature: 97.8 F, Pulse: 83 bpm, Respiratory Rate: 16 breaths/min, Blood Pressure: 122/63 mmHg. Respiratory normal breathing without difficulty. Cardiovascular 1+ pitting edema of the bilateral lower extremities. Psychiatric this patient is able to make decisions and demonstrates good insight into disease process. Alert  and Oriented x 3. pleasant and cooperative. General Notes: Patient's wound in regard to his left lower extremity is almost completely healed and appears to be doing very well. In regard to the left great toe he shows signs of good improvement in regard to the wound size and wound bed quality nonetheless he is still struggling in order to get this to fully close. Nonetheless the good news is his MRI was reviewed and was negative for osteomyelitis receptive joint the only funding was that there was an ulcer noted which of course we Artie knew. Nonetheless there did not appear to be any obvious form body that was noted at this point. Integumentary (Hair, Skin) Wound #1 status is Open. Original cause of wound was Trauma. The wound is located on the Left,Midline,Anterior Lower Leg. The wound measures 0.4cm length x 0.4cm width x 0.1cm depth; 0.126cm^2 area and 0.013cm^3 volume. There is Fat Layer (Subcutaneous Tissue) Exposed exposed. There is no tunneling or undermining noted. There is a none present amount of drainage noted. The wound margin is distinct with the outline attached to the wound base. There is small (1-33%) red, hyper - granulation within the wound bed. There is no necrotic tissue within the wound bed. The  periwound skin appearance exhibited: Hemosiderin Staining. The periwound skin appearance did not exhibit: Callus, Crepitus, Excoriation, Induration, Rash, Scarring, Dry/Scaly, Maceration, Atrophie Blanche, Cyanosis, Ecchymosis, Mottled, Pallor, Rubor, Erythema. Periwound temperature was noted as No Abnormality. Wound #2 status is Open. Original cause of wound was Trauma. The wound is located on the Left Toe Great. The wound measures 0.2cm length x 0.4cm width x 0.2cm depth; 0.063cm^2 area and 0.013cm^3 volume. There is no tunneling or undermining noted. There is a none present amount of drainage noted. The wound margin is flat and intact. There is no granulation within the wound bed. There is no necrotic tissue within the wound bed. The periwound skin appearance exhibited: Callus. The periwound skin appearance did not exhibit: Crepitus, Excoriation, Induration, Rash, Scarring, Dry/Scaly, Maceration, Atrophie Blanche, Cyanosis, Ecchymosis, Hemosiderin Staining, Mottled, Pallor, Rubor, Erythema. Periwound temperature was noted as No Abnormality. The periwound has tenderness on palpation. Assessment Active Problems ICD-10 Venous insufficiency (chronic) (peripheral) Laceration without foreign body, left lower leg, initial encounter Non-pressure chronic ulcer of other part of left lower leg with fat layer exposed Schake, Huntley Dec. ([id number]) Non-pressure chronic ulcer of other part of left foot limited to breakdown of skin Type 2 diabetes mellitus with foot ulcer Cerebral infarction, unspecified Essential (primary) hypertension Procedures Wound #2 Pre-procedure diagnosis of Wound #2 is a Diabetic Wound/Ulcer of the Lower Extremity located on the Left Toe Great .Severity of Tissue Pre Debridement is: Fat layer exposed. There was a Excisional Skin/Subcutaneous Tissue Debridement with a total area of 0.08 sq cm performed by STONE III, Bradan Congrove E., PA-C. With the following instrument(s): Curette to  remove Viable and Non-Viable tissue/material. Material removed includes Subcutaneous Tissue and Fibrin/Exudate and after achieving pain control using Lidocaine 4% Topical Solution. No specimens were taken. A time out was conducted at 09:14, prior to the start of the procedure. A Minimum amount of bleeding was controlled with Pressure. The procedure was tolerated well with a pain level of 0 throughout and a pain level of 0 following the procedure. Patient s Level of Consciousness post procedure was recorded as Awake and Alert. Post Debridement Measurements: 0.2cm length x 0.4cm width x 0.3cm depth; 0.019cm^3 volume. Character of Wound/Ulcer Post Debridement is improved. Severity of Tissue Post Debridement is: Fat  layer exposed. Post procedure Diagnosis Wound #2: Same as Pre-Procedure Plan Wound Cleansing: Wound #1 Left,Midline,Anterior Lower Leg: Cleanse wound with mild soap and water Wound #2 Left Toe Great: Cleanse wound with mild soap and water May Shower, gently pat wound dry prior to applying new dressing. Anesthetic (add to Medication List): Wound #1 Left,Midline,Anterior Lower Leg: Topical Lidocaine 4% cream applied to wound bed prior to debridement (In Clinic Only). Wound #2 Left Toe Great: Topical Lidocaine 4% cream applied to wound bed prior to debridement (In Clinic Only). Primary Wound Dressing: Wound #1 Left,Midline,Anterior Lower Leg: Hydrogel Collagen Wound #2 Left Toe Great: Silver Alginate Secondary Dressing: Wound #1 Left,Midline,Anterior Lower Leg: ABD pad Non-adherent pad Wound #2 Left Toe Great: Gauze and Kerlix/Conform - secure with coban Foam Dressing Change Frequency: Wound #1 Left,Midline,Anterior Lower Leg: Obryant, Huntley Dec ([id number]) Change dressing every week Wound #2 Left Toe Great: Change dressing every other day. Follow-up Appointments: Wound #1 Left,Midline,Anterior Lower Leg: Return Appointment in 1 week. Nurse Visit as needed Wound #2 Left  Toe Great: Return Appointment in 1 week. Nurse Visit as needed Edema Control: Wound #1 Left,Midline,Anterior Lower Leg: 3 Layer Compression System - Left Lower Extremity - unna to anchor Patient to wear own compression stockings - right lower leg Elevate legs to the level of the heart and pump ankles as often as possible Additional Orders / Instructions: Wound #1 Left,Midline,Anterior Lower Leg: Stop Smoking Vitamin A; Vitamin C, Zinc - Please add a multivitamin with 100% of these Increase protein intake. Activity as tolerated Wound #2 Left Toe Great: Stop Smoking Vitamin A; Vitamin C, Zinc - Please add a multivitamin with 100% of these Increase protein intake. Activity as tolerated At this time I'm gonna suggest that we go ahead and continue with the above wound care orders for the next week. Next week I will likely look toward switching the dressing for the toe I just want to give it one more week with the silver cell since I did so well but if we seem to stall I will switch this up possibly we may go back to the Prisma since that did not get a good try previous due to some other issues we were having. Nonetheless we will see where it stands in one week. Please see above for specific wound care orders. We will see patient for re-evaluation in 1 week(s) here in the clinic. If anything worsens or changes patient will contact our office for additional recommendations. Electronic Signature(s) Signed: 06/19/2018 12:21:38 AM By: Worthy Keeler PA-C Entered By: Worthy Keeler on 06/18/2018 09:25:08 Hellstrom, Huntley Dec ([id number]) -------------------------------------------------------------------------------- ROS/PFSH Details Patient Name: [name]. Date of Service: 06/18/2018 8:30 AM Medical Record Number: [id number] Patient Account Number: [id number] Date of Birth/Sex: 04/04/1951 (67 y.o. M) Treating RN: Ahmed Prima Primary Care Provider: BESS, KATY Other  Clinician: Referring Provider: BESS, KATY Treating Provider/Extender: STONE III, Andromeda Poppen Weeks in Treatment: 8 Information Obtained From Patient Wound History Do you currently have one or more open woundso Yes How many open wounds do you currently haveo 2 Approximately how long have you had your woundso 2 weeks How have you been treating your wound(s) until nowo yes Doctor Has your wound(s) ever healed and then re-openedo No Have you had any lab work done in the past montho No Have you tested positive for an antibiotic resistant organism (MRSA, VRE)o No Have you tested positive for osteomyelitis (bone infection)o No Have you had any tests for circulation on your legso No  Have you had other problems associated with your woundso Infection, Swelling Constitutional Symptoms (General Health) Complaints and Symptoms: Negative for: Fever; Chills Cardiovascular Complaints and Symptoms: Positive for: LE edema Medical History: Positive for: Arrhythmia - pacemaker; Coronary Artery Disease; Hypertension; Myocardial Infarction Negative for: Deep Vein Thrombosis; Hypotension; Peripheral Arterial Disease; Peripheral Venous Disease; Phlebitis; Vasculitis Eyes Medical History: Negative for: Cataracts; Glaucoma; Optic Neuritis Ear/Nose/Mouth/Throat Medical History: Negative for: Chronic sinus problems/congestion Hematologic/Lymphatic Medical History: Positive for: Lymphedema Negative for: Anemia; Hemophilia; Human Immunodeficiency Virus; Sickle Cell Disease Respiratory Complaints and Symptoms: No Complaints or Symptoms Mauck, Huntley Dec ([id number]) Medical History: Negative for: Aspiration; Asthma; Chronic Obstructive Pulmonary Disease (COPD); Pneumothorax; Sleep Apnea; Tuberculosis Gastrointestinal Medical History: Negative for: Cirrhosis ; Colitis; Crohnos; Hepatitis A; Hepatitis B; Hepatitis C Endocrine Medical History: Positive for: Type II Diabetes Negative for: Type I  Diabetes Time with diabetes: 5 years Treated with: Oral agents Blood sugar tested every day: Yes Tested : daily AM Blood sugar testing results: Breakfast: 174 Genitourinary Medical History: Negative for: End Stage Renal Disease Immunological Medical History: Negative for: Lupus Erythematosus; Raynaudos; Scleroderma Integumentary (Skin) Medical History: Negative for: History of Burn; History of pressure wounds Musculoskeletal Medical History: Negative for: Gout; Rheumatoid Arthritis; Osteoarthritis; Osteomyelitis Neurologic Medical History: Positive for: Neuropathy Negative for: Dementia; Quadriplegia; Paraplegia; Seizure Disorder Oncologic Medical History: Positive for: Received Chemotherapy; Received Radiation - Seeds prostate Psychiatric Complaints and Symptoms: No Complaints or Symptoms Medical History: Negative for: Anorexia/bulimia; Confinement Anxiety Shreeve, MACARIO SHEAR ([id number]) Immunizations Pneumococcal Vaccine: Received Pneumococcal Vaccination: Yes Tetanus Vaccine: Last tetanus shot: 12/25/2017 Implantable Devices Family and Social History Cancer: Yes - Father; Diabetes: Yes - Father; Heart Disease: Yes - Mother,Father; Hypertension: Yes - Mother; Kidney Disease: Yes - Child; Lung Disease: No; Seizures: No; Stroke: No; Thyroid Problems: No; Tuberculosis: No; Current every day smoker - 1 - 1/2 pack daily; Marital Status - Married; Alcohol Use: Rarely; Drug Use: No History; Caffeine Use: Daily - coffee; Financial Concerns: No; Food, Clothing or Shelter Needs: No; Support System Lacking: No; Transportation Concerns: No Physician Affirmation I have reviewed and agree with the above information. Electronic Signature(s) Signed: 06/19/2018 12:21:38 AM By: Worthy Keeler PA-C Signed: 06/19/2018 4:02:05 PM By: Alric Quan Entered By: Worthy Keeler on 06/18/2018 09:24:11 Flores, Huntley Dec  ([id number]) -------------------------------------------------------------------------------- SuperBill Details Patient Name: [name]. Date of Service: 06/18/2018 Medical Record Number: [id number] Patient Account Number: [id number] Date of Birth/Sex: 27-Aug-1951 (67 y.o. M) Treating RN: Ahmed Prima Primary Care Provider: BESS, KATY Other Clinician: Referring Provider: BESS, KATY Treating Provider/Extender: STONE III, Juniel Groene Weeks in Treatment: 8 Diagnosis Coding ICD-10 Codes Code Description I87.2 Venous insufficiency (chronic) (peripheral) S81.812A Laceration without foreign body, left lower leg, initial encounter L97.822 Non-pressure chronic ulcer of other part of left lower leg with fat layer exposed L97.521 Non-pressure chronic ulcer of other part of left foot limited to breakdown of skin E11.621 Type 2 diabetes mellitus with foot ulcer I63.9 Cerebral infarction, unspecified I10 Essential (primary) hypertension Facility Procedures CPT4 Code Description: 35573220 11042 - DEB SUBQ TISSUE 20 SQ CM/< ICD-10 Diagnosis Description L97.521 Non-pressure chronic ulcer of other part of left foot limited [name] Modifier: o breakdown of s Quantity: 1 kin Physician Procedures CPT4 Code Description: 2542706 23762 - WC PHYS SUBQ TISS 20 SQ CM ICD-10 Diagnosis Description L97.521 Non-pressure chronic ulcer of other part of left foot limited [name] Modifier: o breakdown of sk Quantity: 1 in Electronic Signature(s) Signed: 06/19/2018 12:21:38 AM By: Worthy Keeler PA-C Entered By: Joaquim Lai  IIIMargarita Grizzle on 06/18/2018 00:17:49

## 2018-06-20 NOTE — Addendum Note (Signed)
Addended by: Fonnie Mu on: 06/20/2018 02:22 PM   Modules accepted: Orders

## 2018-06-20 NOTE — Assessment & Plan Note (Signed)
EKG- no changes from last tracing 02/2018 Please take all medications as directed, especially evening dose of Metformin. Please use new diabetic test strips and continue checking blood sugar. Increase plain water intake. Follow diabetic diet. Continue with wound care as directed. Follow-up with Cardiology as directed. Please schedule your colonoscopy. Follow-up in one month- diabetes, high blood pressure, physical

## 2018-06-20 NOTE — Progress Notes (Signed)
Subjective:   [name] is a 68 y.o. male who presents for an Initial Medicare Annual Wellness Visit. [name] reports inconsistent compliance with Metformin, specifically his HS dose. He reports medication compliance with all other medications. He reports AM BS 110-160s, mean reading 130 He denies episodes of hypoglycemia He has been followed closely by wound care, re:  LLE shin wound and L great toe wound He is healing well, but still reports pain at L great toe.  He needs to follow up with Cardiology  Review of Systems: General:   Denies fever, chills, unexplained weight loss.  Optho/Auditory:   Denies visual changes, blurred vision/LOV Respiratory:   Denies SOB, DOE more than baseline levels.  Cardiovascular:   Denies chest pain, palpitations, new onset peripheral edema  Gastrointestinal:   Denies nausea, vomiting, diarrhea.  Genitourinary: Denies dysuria, freq/ urgency, flank pain or discharge from genitals.  Endocrine:     Denies hot or cold intolerance, polyuria, polydipsia. Musculoskeletal:   Denies unexplained myalgias, joint swelling, unexplained arthralgias, gait problems.  Skin:  Denies rash, suspicious lesions., healing wound on LLE Neurological:     Denies dizziness, unexplained weakness, numbness  Psychiatric/Behavioral:   Denies mood changes, suicidal or homicidal ideations, hallucinations      Objective:    Today's Vitals   06/20/18 0833  BP: 112/69  Pulse: 79  SpO2: 96%  Weight: 236 lb 1.6 oz (107.1 kg)  Height: 5' 8.5" (1.74 m)   Body mass index is 35.38 kg/m.  Advanced Directives 11/27/2017 08/15/2016 08/11/2016 12/08/2013 02/02/2012  Does Patient Have a Medical Advance Directive? No No No Patient does not have advance directive;Patient would not like information Patient would like information  Would patient like information on creating a medical advance directive? Yes (MAU/Ambulatory/Procedural Areas - Information given) - No - patient declined  information - -    Current Medications (verified) Outpatient Encounter Medications as of 06/20/2018  Medication Sig  . apixaban (ELIQUIS) 5 MG TABS tablet Take 1 tablet (5 mg total) by mouth 2 (two) times daily.  Marland Kitchen atorvastatin (LIPITOR) 40 MG tablet Take 1 tablet (40 mg total) by mouth daily.  Marland Kitchen buPROPion (WELLBUTRIN XL) 150 MG 24 hr tablet one tablet twice daily  . dapagliflozin propanediol (FARXIGA) 5 MG TABS tablet Take 5 mg by mouth daily.  Marland Kitchen gabapentin (NEURONTIN) 300 MG capsule Take 1 tablet every morning and afternoon. 2 tablets at dinner time.  . gabapentin (NEURONTIN) 300 MG capsule TAKE 1 CAPSULE BY MOUTH IN THE MORNING AND 2 TABLETS AT [facility] TIME  . glucose blood (FREESTYLE LITE) test strip Use to check blood sugars every morning fasting  . lisinopril (PRINIVIL,ZESTRIL) 5 MG tablet Take 0.5 tablets (2.5 mg total) by mouth daily.  . metFORMIN (GLUCOPHAGE) 1000 MG tablet Take 1 tablet (1,000 mg total) by mouth 2 (two) times daily with a meal.  . Multiple Vitamins-Minerals (MULTIVITAMIN ADULT PO) Take 1 tablet by mouth daily.   . silodosin (RAPAFLO) 4 MG CAPS capsule Take 1 capsule (4 mg total) by mouth daily with breakfast.  . tamsulosin (FLOMAX) 0.4 MG CAPS capsule Take 1 capsule by mouth daily.  . [DISCONTINUED] clindamycin (CLEOCIN) 300 MG capsule Take 1 capsule (300 mg total) by mouth 3 (three) times daily.  . [DISCONTINUED] doxycycline (VIBRA-TABS) 100 MG tablet Take 1 tablet (100 mg total) by mouth 2 (two) times daily.   No facility-administered encounter medications on file as of 06/20/2018.     Allergies (verified) Bee venom; Oysters [  shellfish allergy]; and Penicillins   History: Past Medical History:  Diagnosis Date  . Chronic venous insufficiency LOWER EXTREMITIES  . Coronary artery disease CARDIOLOGIST - DR  [id number]-  LAST 1 WK AGO -- WILL REQUEST NOTE AND STRESS TEST  . Diabetes mellitus without complication ([place])   . Hyperglycemia   . Hyperlipidemia   .  Hypertension   . Mixed dyslipidemia   . Myocardial infarction ([place])   . Neuropathy   . Nocturia   . Prostate cancer ([place]) 08/18/11   gleason 8, volume 24.4cc  . S/P CABG x 4   . ST elevation MI (STEMI) ([place]) 02-10-2008   S/P CABG  . Stroke ([place])   . Urinary hesitancy   . Vision abnormalities    Past Surgical History:  Procedure Laterality Date  . CARDIAC CATHETERIZATION  05/01/2012   grafts widely patent  . CARDIOVASCULAR STRESS TEST  03-15-2010   INFERIOR WALL SCAR WITHOUT ANY MEANINGFUL ISCHEMIA/ EF 46% / LOW RISK SCAN  . CORONARY ARTERY BYPASS GRAFT  02-10-2008  DR Einar Gip   X4 VESSEL DISEASE / [facility] CABG  . CYSTOSCOPY  02/08/2012   Procedure: CYSTOSCOPY FLEXIBLE;  Surgeon: Franchot Gallo, MD;  Location: [facility];  Service: Urology;;  . EP IMPLANTABLE DEVICE N/A 08/14/2016   Procedure: Loop Recorder Insertion;  Surgeon: Evans Lance, MD;  Location: [facility];  Service: Cardiovascular;  Laterality: N/A;  . KNEE SURGERY  age 47   RIGHT  . LEFT HEART CATHETERIZATION WITH CORONARY/GRAFT ANGIOGRAM N/A 05/01/2012   Procedure: LEFT HEART CATHETERIZATION WITH Beatrix Fetters;  Surgeon: Sanda Klein, MD;  Location: [facility];  Service: Cardiovascular;  Laterality: N/A;  . MULTIPLE TEETH EXTRACTIONS (23)/ FOUR QUADRANT ALVEOLPLASTY/ MANDIBULAR LATERAL EXOSTOSES REDUCTIONS  03-24-2010   CHRONIC PERIODONTITIS  . RADIOACTIVE SEED IMPLANT  02/08/2012   Procedure: RADIOACTIVE SEED IMPLANT;  Surgeon: Franchot Gallo, MD;  Location: [facility];  Service: Urology;  Laterality: N/A;  C-ARM   . SHOULDER SURGERY  1992   LEFT  . TEE WITHOUT CARDIOVERSION N/A 08/14/2016   Procedure: TRANSESOPHAGEAL ECHOCARDIOGRAM (TEE);  Surgeon: Josue Hector, MD;  Location: [facility];  Service: Cardiovascular;  Laterality: N/A;   Family History  Problem Relation Age of Onset  . Cancer Father        pancreatic  . Diabetes Father    Social History    Socioeconomic History  . Marital status: Married    Spouse name: Not on file  . Number of children: Not on file  . Years of education: Not on file  . Highest education level: Not on file  Occupational History  . Not on file  Social Needs  . Financial resource strain: Not on file  . Food insecurity:    Worry: Not on file    Inability: Not on file  . Transportation needs:    Medical: Not on file    Non-medical: Not on file  Tobacco Use  . Smoking status: Current Every Day Smoker    Packs/day: 1.50    Years: 50.00    Pack years: 75.00    Types: Cigarettes  . Smokeless tobacco: Never Used  . Tobacco comment: PREVIOUSLY SMOKED 3PPD / DECREASED TO 1PPD SINCE 2009  Substance and Sexual Activity  . Alcohol use: Yes    Alcohol/week: 1.2 oz    Types: 1 Cans of beer, 1 Shots of liquor per week    Comment: occassional  . Drug use: No  .  Sexual activity: Never  Lifestyle  . Physical activity:    Days per week: Not on file    Minutes per session: Not on file  . Stress: Not on file  Relationships  . Social connections:    Talks on phone: Not on file    Gets together: Not on file    Attends religious service: Not on file    Active member of club or organization: Not on file    Attends meetings of clubs or organizations: Not on file    Relationship status: Not on file  Other Topics Concern  . Not on file  Social History Narrative  . Not on file   Tobacco Counseling Ready to quit: Not Answered Counseling given: Not Answered Comment: PREVIOUSLY SMOKED 3PPD / DECREASED TO 1PPD SINCE 2009  Activities of Daily Living In your present state of health, do you have any difficulty performing the following activities: 06/20/2018  Hearing? Y  Vision? N  Difficulty concentrating or making decisions? N  Walking or climbing stairs? N  Dressing or bathing? N  Doing errands, shopping? N  Some recent data might be hidden     Immunizations and Health Maintenance Immunization History   Administered Date(s) Administered  . Influenza, High Dose Seasonal PF 11/27/2017  . Pneumococcal Conjugate-13 11/27/2017  . Tdap 01/07/2018   Health Maintenance Due  Topic Date Due  . COLONOSCOPY  02/21/2002    Patient Care Team: Esaw Grandchild, NP as PCP - General (Family Medicine) Croitoru, Dani Gobble, MD as PCP - Cardiology (Cardiology)  Indicate any recent Medical Services you may have received from other than [facility] providers in the past year (date may be approximate).    Assessment:   This is a routine wellness examination for [name].  Hearing/Vision screen No exam data present  Dietary issues and exercise activities discussed:    Goals    None     Depression Screen PHQ 2/9 Scores 06/20/2018 04/17/2018 04/01/2018 02/25/2018  PHQ - 2 Score 0 0 0 0  PHQ- 9 Score 0 0 0 0    Fall Risk Fall Risk  11/27/2017 10/17/2016  Falls in the past year? No No    Is the patient's home free of loose throw rugs in walkways, pet beds, electrical cords, etc?   yes      Grab bars in the bathroom? yes      Handrails on the stairs?   yes      Adequate lighting?   yes  Timed Get Up and Go performed: WNL  Cognitive Function: WNL     6CIT Screen 06/20/2018  What Year? 0 points  What month? 0 points  What time? 0 points  Count back from 20 0 points  Months in reverse 0 points  Repeat phrase 2 points  Total Score 2    Screening Tests Health Maintenance  Topic Date Due  . COLONOSCOPY  02/21/2002  . Hepatitis C Screening  11/27/2018 (Originally 01-23-1951)  . INFLUENZA VACCINE  07/25/2018  . HEMOGLOBIN A1C  09/14/2018  . FOOT EXAM  11/27/2018  . PNA vac Low Risk Adult (2 of 2 - PPSV23) 11/27/2018  . OPHTHALMOLOGY EXAM  03/20/2019  . TETANUS/TDAP  01/08/2028    Qualifies for Shingles Vaccine? He will will check with his insurance  Cancer Screenings: Lung: Low Dose CT Chest recommended if Age 41-80 years, 30 pack-year currently smoking OR have quit w/in 15years. Patient does not  qualify. LDCT completed 02/2018, will repeat in one year Colorectal:  colonoscopy ordered  Additional Screenings: Hepatitis C Screening: Orderd      Plan:    EKG- no changes from last tracing 02/2018 Please take all medications as directed, especially evening dose of Metformin. Please use new diabetic test strips and continue checking blood sugar. Increase plain water intake. Follow diabetic diet. Continue with wound care as directed. Follow-up with Cardiology as directed. Please schedule your colonoscopy. Follow-up in one month- diabetes, high blood pressure, physical  I have personally reviewed and noted the following in the patient's chart:   . Medical and social history . Use of alcohol, tobacco or illicit drugs  . Current medications and supplements . Functional ability and status . Nutritional status . Physical activity . Advanced directives . List of other physicians . Hospitalizations, surgeries, and ER visits in previous 12 months . Vitals . Screenings to include cognitive, depression, and falls . Referrals and appointments  In addition, I have reviewed and discussed with patient certain preventive protocols, quality metrics, and best practice recommendations. A written personalized care plan for preventive services as well as general preventive health recommendations were provided to patient.     Esaw Grandchild, NP   06/20/2018

## 2018-06-20 NOTE — Patient Instructions (Signed)
  Mr. Im , Thank you for taking time to come for your Medicare Wellness Visit. I appreciate your ongoing commitment to your health goals. Please review the following plan we discussed and let me know if I can assist you in the future.   These are the goals we discussed: Goals    None      EKG- no changes from last tracing 02/2018 Please take all medications as directed, especially evening dose of Metformin. Please use new diabetic test strips and continue checking blood sugar. Increase plain water intake. Follow diabetic diet. Continue with wound care as directed. Follow-up with Cardiology as directed. Please schedule your colonoscopy. Follow-up in one month- diabetes, high blood pressure, physical  This is a list of the screening recommended for you and due dates:  Health Maintenance  Topic Date Due  . Colon Cancer Screening  02/21/2002  .  Hepatitis C: One time screening is recommended by Center for Disease Control  (CDC) for  adults born from [birth year] through 1964.   11/27/2018*  . Flu Shot  07/25/2018  . Hemoglobin A1C  09/14/2018  . Complete foot exam   11/27/2018  . Pneumonia vaccines (2 of 2 - PPSV23) 11/27/2018  . Eye exam for diabetics  03/20/2019  . Tetanus Vaccine  01/08/2028  *Topic was postponed. The date shown is not the original due date.    NICE TO SEE YOU!

## 2018-06-20 NOTE — Telephone Encounter (Signed)
Patient states was instructed to contact [employer] to see what glucose meter they will pay for, [facility] [place] by [facility].  --Forwarding message to medical assistant to call in Rx to:   Preferred [facility] [address] ([address]), [place] [phone] (Phone) [phone] (Fax)   --glh

## 2018-06-20 NOTE — Telephone Encounter (Signed)
Please advise.  T. Cyrah Mclamb, CMA 

## 2018-06-25 ENCOUNTER — Encounter: Payer: PPO | Attending: Physician Assistant | Admitting: Physician Assistant

## 2018-06-25 DIAGNOSIS — E11621 Type 2 diabetes mellitus with foot ulcer: Secondary | ICD-10-CM | POA: Insufficient documentation

## 2018-06-25 DIAGNOSIS — I872 Venous insufficiency (chronic) (peripheral): Secondary | ICD-10-CM | POA: Diagnosis not present

## 2018-06-25 DIAGNOSIS — L97521 Non-pressure chronic ulcer of other part of left foot limited to breakdown of skin: Secondary | ICD-10-CM | POA: Diagnosis not present

## 2018-06-25 DIAGNOSIS — L97822 Non-pressure chronic ulcer of other part of left lower leg with fat layer exposed: Secondary | ICD-10-CM | POA: Insufficient documentation

## 2018-06-25 DIAGNOSIS — L97522 Non-pressure chronic ulcer of other part of left foot with fat layer exposed: Secondary | ICD-10-CM | POA: Diagnosis not present

## 2018-06-25 DIAGNOSIS — I10 Essential (primary) hypertension: Secondary | ICD-10-CM | POA: Diagnosis not present

## 2018-06-25 DIAGNOSIS — S81812A Laceration without foreign body, left lower leg, initial encounter: Secondary | ICD-10-CM | POA: Insufficient documentation

## 2018-06-26 ENCOUNTER — Other Ambulatory Visit: Payer: Self-pay | Admitting: Urology

## 2018-06-26 DIAGNOSIS — D3002 Benign neoplasm of left kidney: Secondary | ICD-10-CM

## 2018-06-26 DIAGNOSIS — N200 Calculus of kidney: Secondary | ICD-10-CM | POA: Diagnosis not present

## 2018-06-26 DIAGNOSIS — R310 Gross hematuria: Secondary | ICD-10-CM | POA: Diagnosis not present

## 2018-06-26 DIAGNOSIS — C61 Malignant neoplasm of prostate: Secondary | ICD-10-CM | POA: Diagnosis not present

## 2018-06-26 MED FILL — SILDENAFIL CITRATE 100 MG T: 100 | 30 days supply | Qty: 6 | Fill #0

## 2018-06-26 NOTE — Progress Notes (Signed)
ARDIT, [name] Visit Report for 06/25/2018 Chief Complaint Document Details Patient Name: [name], [name]. Date of Service: 06/25/2018 8:45 AM Medical Record Number: [id number] Patient Account Number: [id number] Date of Birth/Sex: Dec 31, 1950 (67 y.o. M) Treating RN: Ahmed Prima Primary Care Provider: BESS, Valetta Fuller Other Clinician: Referring Provider: BESS, KATY Treating Provider/Extender: STONE III, Fields Oros Weeks in Treatment: 9 Information Obtained from: Patient Chief Complaint Left lower leg and left great toe ulcer Electronic Signature(s) Signed: 06/26/2018 1:22:53 AM By: Worthy Keeler PA-C Entered By: Worthy Keeler on 06/25/2018 08:51:57 [name] ([id number]) -------------------------------------------------------------------------------- HPI Details Patient Name: [name]. Date of Service: 06/25/2018 8:45 AM Medical Record Number: [id number] Patient Account Number: [id number] Date of Birth/Sex: 1951-08-08 (67 y.o. M) Treating RN: Ahmed Prima Primary Care Provider: BESS, KATY Other Clinician: Referring Provider: BESS, KATY Treating Provider/Extender: STONE III, Suheyla Mortellaro Weeks in Treatment: 9 History of Present Illness Associated Signs and Symptoms: Patient has a history of chronic venous insufficiency, diabetes mellitus type II, its reform fraction, hypertension HPI Description: 04/19/18 on evaluation today patient presents initially concerning an ulcer of his left great toe and left anterior shin. He states that the shin was definitely a trauma injury where he struck this on a metal pole injuring leg. In regard to the distal left great toe he's really not sure what happened here although he does have a significant callous noted as well. He does have a pacemaker. Fortunately his ABI was 0.98 on the right and 0.83 on the left this appears to be doing fairly well. Overall I'm pleased with that. Nonetheless he states that this really has not seem to be  improving. He has been tolerating the dressing changes without complication. The left anterior leg ulcer began on 04/05/18 according to what he tells [place] today although one note I did have for review said it was April 5. I'm inclined to believe it was the fifth she did have an office visit on 04/01/18 with his family nurse practitioner Faith Rogue. Nonetheless either way it's been going on this month for several weeks. The left great toe has actually been present since January 1 he does not know how this occurred. Fortunately has no pain at the site although he does have pain on the left anterior shin. His hemoglobin A1c is 8.0 in March 2019. 04/26/18 on evaluation today patient presents for follow-up concerning his life into your [place] also as well as his left great toe also. Fortunately the shin actually appears to be doing better on evaluation today in my opinion I'm not seeing as much in the way of fluffernutter on the surface although it does still require debridement today this is improved. Size wise it's really not much different. With that being said the total ulcer actually appears to be a little bit more macerated today I'm not sure why they state that several days ago when this was changed that was not the case. Fortunately is not having severe pain although both areas do hurt the shin is worse. 05/03/18 on evaluation today patient appears to be doing better in regard to his left first toe and left shin ulcers. He is been tolerating the dressing changes without complication and the good news is this seems to be showing signs of getting better. Overall I'm very pleased with the progress that has been made over the past week. Patient is still having pain especially in regard to the left shin. 05/10/18 on evaluation today patient appears to be doing  rather well in regard to his two ulcers. The toe ulcer appears to be smaller he does have some undermining however in this had to be cleaned  out just a little bit. With that being said overall I feel like this is showing signs of improvement. The left anterior shin ulcer also showing signs of improvement at this time. There does not appear to be any evidence of infection which is good news. 05/21/18 on evaluation today patient appears to be doing very well in regard to his left shin ulcer. He has been tolerating the dressing changes without complication. With that being said he is having issues at this point with his great toe on the left. We have not x-rayed that at this point I think we may need to. Nonetheless he has pain really at the roughly 5 o'clock location but nowhere else and I really cannot explain this I do not see any obvious form body. Nonetheless he continues to have issues with getting this area to close it's not progressing as nicely. 05/28/18 on evaluation today patient appears to be doing very well in regard to his left lower extremity anterior ulcer. He has been tolerating the dressing changes without complication. Overall this is making excellent progress with the collagen dressing. His left great toe x-ray did return and showed that he had no if you that normality. He did have a plantar heel spur but this is more degenerative. Otherwise just arthritis was noted no osteomyelitis and no obvious form body was visualized. Overall things seem to be going fairly well. With that being said he still has pain when looking at the toe from the plantar aspect at roughly the six back to the 4 o'clock location which is not quite as bad as last time but still hurts more than any other part of his wound. It does appear the Iodoflex has been of benefit however. 06/04/18 on evaluation today patient appears to be doing excellent in regard to his left anterior shin ulcer. This has made excellent progress and is showing signs of improving in fact very close to completely closing in healing. Nonetheless unfortunately his left great toe has  not faired quite as well as far as progress is concerned. He notes that after last week's Barocio, DAMIN SALIDO. ([id number]) debridement he actually seem to be doing a little bit better for a couple of days until Thursday of last week when he had increases in discomfort as well is changes in the draining. He states that the drainage has been more green in color according to his wife and what he has seen. He's also had more discomfort. He relates to me again that he really feels like this all started when he stepped on a nail although he does not exactly remember it it seemed to be a puncture wound that he feels may be the culprit for why all this started. Nonetheless I am getting more concerned about the possibility of osteomyelitis despite the fact that the x-ray was negative I was hopeful in this interim since last week to this week that we would see some improvement in the overall appearance of the toe. Unfortunately if anything I feel like it's a little worse. It was macerated but I think we can attribute this to the fact that the patient did have an episode last night we got stuck in the rain and he thinks the dressing got wet. He did not change it I told him in the future if this were to  happen to please go ahead and change the dressing it won't hurt to change it more frequently in that situation. Otherwise other than the maceration I do not see any evidence of anything being any worse but it also does not appear to be any better. 06/11/18 on evaluation today patient's wounds both the great toe as well as the left shin both on the left side show signs of good improvement. Fortunately the patient's culture came back negative in regard to the toe and there does not appear to be any evidence of infection. His MRI is actually scheduled for 06/19/18. Fortunately this is right before I see him next week and then we can go to the results of the culture at that point. Patient is in agreement with that plan.  With that being said otherwise he has been showing signs of having less pain in regard to the toe which is also good news my hope is that he does not have osteomyelitis. 06/18/18 on evaluation today patient appears to be doing very well in regard to his left lower extremity ulcer. He also states at this point in time that he has been tolerating the dressing changes with the toe it appears also to be doing very well at this point. In general I'm happy with the progress that he seems to be making. With that being said he still continues to have pain in regard to the great toe ulcer fortunately this does not seem to be as significant as it was in the beginning but still nonetheless he is having some pain. No fevers, chills, nausea, or vomiting noted at this time. 06/25/18 on evaluation today patient actually appears to be doing very well in regard to his left anterior shin ulcer. He's been tolerating the dressing changes without complication in this seems to have done extremely well. In fact this appears to be healed. With that being said his toe ulcer still continues to get in trouble fortunately not as much as has been in the past and there have been some signs of improvement and better granulation although I still think he's getting a lot of callous to the area which seems to indicate a lot of pressure and friction. I believe he may benefit from a total contact cast especially now that the left shin ulcer has healed. Electronic Signature(s) Signed: 06/26/2018 1:22:53 AM By: Worthy Keeler PA-C Entered By: Worthy Keeler on 06/25/2018 10:08:07 [name], [name] ([id number]) -------------------------------------------------------------------------------- Physical Exam Details Patient Name: Marcus Loll T. Date of Service: 06/25/2018 8:45 AM Medical Record Number: [id number] Patient Account Number: [id number] Date of Birth/Sex: 01/23/1951 (67 y.o. M) Treating RN: Ahmed Prima Primary Care  Provider: BESS, KATY Other Clinician: Referring Provider: BESS, KATY Treating Provider/Extender: STONE III, Damiah Mcdonald Weeks in Treatment: 9 Constitutional Well-nourished and well-hydrated in no acute distress. Respiratory normal breathing without difficulty. Psychiatric this patient is able to make decisions and demonstrates good insight into disease process. Alert and Oriented x 3. pleasant and cooperative. Notes Patient's wound at this time shows good evidence of epithelialization healing in regard to the left anterior shin. He has been tolerating the dressing changes without complication. In regard to his left first toe he does have slough noted on the surface there's some callous surrounding which did require sharp debridement. I performed this day without complication post debridement the wound bed appears to be doing much better. Electronic Signature(s) Signed: 06/26/2018 1:22:53 AM By: Worthy Keeler PA-C Entered By: Worthy Keeler on 06/25/2018 10:08:53  BEXTON, HAAK ([id number]) -------------------------------------------------------------------------------- Physician Orders Details Patient Name: [name], MULVEHILL. Date of Service: 06/25/2018 8:45 AM Medical Record Number: [id number] Patient Account Number: [id number] Date of Birth/Sex: 03-Nov-1951 (67 y.o. M) Treating RN: Ahmed Prima Primary Care Provider: BESS, KATY Other Clinician: Referring Provider: BESS, KATY Treating Provider/Extender: STONE III, Sulamita Lafountain Weeks in Treatment: 9 Verbal / Phone Orders: Yes Clinician: Carolyne Fiscal, Debi Read Back and Verified: Yes Diagnosis Coding ICD-10 Coding Code Description I87.2 Venous insufficiency (chronic) (peripheral) C14.481E Laceration without foreign body, left lower leg, initial encounter L97.822 Non-pressure chronic ulcer of other part of left lower leg with fat layer exposed L97.521 Non-pressure chronic ulcer of other part of left foot limited to breakdown of skin E11.621 Type  2 diabetes mellitus with foot ulcer I63.9 Cerebral infarction, unspecified I10 Essential (primary) hypertension Wound Cleansing Wound #2 Left Toe Great o Cleanse wound with mild soap and water o May Shower, gently pat wound dry prior to applying new dressing. Anesthetic (add to Medication List) Wound #2 Left Toe Great o Topical Lidocaine 4% cream applied to wound bed prior to debridement (In Clinic Only). Primary Wound Dressing Wound #2 Left Toe Great o Silver Collagen - moisten lightly with saline Secondary Dressing Wound #2 Left Toe Great o Gauze and Kerlix/Conform - secure with coban o Foam Dressing Change Frequency Wound #2 Left Toe Great o Change dressing every other day. Follow-up Appointments Wound #2 Left Toe Great o Return Appointment in 1 week. o Nurse Visit as needed Off-Loading Wound #2 Left Toe Great o Other: - Keep as much pressure off of toe as you can. JONH, MCQUEARY ([id number]) Additional Orders / Instructions Wound #2 Left Toe Great o Stop Smoking o Vitamin A; Vitamin C, Zinc - Please add a multivitamin with 100% of these o Increase protein intake. o Activity as tolerated Patient Medications Allergies: bee venom protein (honey bee), penicillin, oyster shell Notifications Medication Indication Start End lidocaine DOSE 1 - topical 4 % cream - 1 cream topical Electronic Signature(s) Signed: 06/25/2018 4:10:39 PM By: Alric Quan Signed: 06/26/2018 1:22:53 AM By: Worthy Keeler PA-C Entered By: Alric Quan on 06/25/2018 09:24:25 [name] ([id number]) -------------------------------------------------------------------------------- Prescription 06/25/2018 Patient Name: Marcus Loll T. Provider: Worthy Keeler PA-C Date of Birth: 1951/06/26 NPI#: [id number] Sex: [name] DEA#: [id number] Phone #: [phone] License #: Patient Address: [place], [place] [address] [address] [place], [place] [address] [phone] Allergies bee venom protein (honey bee) penicillin oyster shell Medication Medication: Route: Strength: Form: lidocaine topical 4% cream Class: TOPICAL LOCAL ANESTHETICS Dose: Frequency / Time: Indication: 1 1 cream topical Number of Refills: Number of Units: 0 Generic Substitution: Start Date: End Date: Administered at [place]: Yes Time Administered: Time Discontinued: Note to Pharmacy: Signature(s): Date(s): Electronic Signature(s) [name], [name] ([id number]) Signed: 06/25/2018 4:10:39 PM By: Alric Quan Signed: 06/26/2018 1:22:53 AM By: Worthy Keeler PA-C Entered By: Alric Quan on 06/25/2018 09:24:26 [name], [name] ([id number]) --------------------------------------------------------------------------------  Problem List Details Patient Name: Marcus Loll T. Date of Service: 06/25/2018 8:45 AM Medical Record Number: [id number] Patient Account Number: [id number] Date of Birth/Sex: 25-Feb-1951 (67 y.o. M) Treating RN: Ahmed Prima Primary Care Provider: BESS, KATY Other Clinician: Referring Provider: BESS, KATY Treating Provider/Extender: STONE III, Peder Allums Weeks in Treatment: 9 Active Problems ICD-10 Evaluated Encounter Code Description Active Date Today Diagnosis I87.2 Venous insufficiency (chronic) (peripheral) 04/19/2018 No Yes S81.812A Laceration without foreign body,  left lower leg, initial 04/19/2018 No Yes encounter L97.822 Non-pressure chronic ulcer of other part of left lower leg with 04/19/2018 No Yes fat layer exposed L97.521 Non-pressure chronic ulcer of other part of left foot limited to 04/19/2018 No Yes breakdown of skin E11.621 Type 2 diabetes mellitus with foot ulcer 04/19/2018 No Yes I63.9 Cerebral infarction, unspecified 04/19/2018 No Yes I10 Essential (primary) hypertension 04/19/2018 No  Yes Inactive Problems Resolved Problems Electronic Signature(s) Signed: 06/26/2018 1:22:53 AM By: Worthy Keeler PA-C Entered By: Worthy Keeler on 06/25/2018 08:51:41 Voit, [name] ([id number]) -------------------------------------------------------------------------------- Progress Note Details Patient Name: Marcus Loll T. Date of Service: 06/25/2018 8:45 AM Medical Record Number: [id number] Patient Account Number: [id number] Date of Birth/Sex: 26-Sep-1951 (67 y.o. M) Treating RN: Ahmed Prima Primary Care Provider: BESS, KATY Other Clinician: Referring Provider: BESS, KATY Treating Provider/Extender: STONE III, Lorance Pickeral Weeks in Treatment: 9 Subjective Chief Complaint Information obtained from Patient Left lower leg and left great toe ulcer History of Present Illness (HPI) The following HPI elements were documented for the patient's wound: Associated Signs and Symptoms: Patient has a history of chronic venous insufficiency, diabetes mellitus type II, its reform fraction, hypertension 04/19/18 on evaluation today patient presents initially concerning an ulcer of his left great toe and left anterior shin. He states that the shin was definitely a trauma injury where he struck this on a metal pole injuring leg. In regard to the distal left great toe he's really not sure what happened here although he does have a significant callous noted as well. He does have a pacemaker. Fortunately his ABI was 0.98 on the right and 0.83 on the left this appears to be doing fairly well. Overall I'm pleased with that. Nonetheless he states that this really has not seem to be improving. He has been tolerating the dressing changes without complication. The left anterior leg ulcer began on 04/05/18 according to what he tells [place] today although one note I did have for review said it was April 5. I'm inclined to believe it was the fifth she did have an office visit on 04/01/18 with his family nurse practitioner  Faith Rogue. Nonetheless either way it's been going on this month for several weeks. The left great toe has actually been present since January 1 he does not know how this occurred. Fortunately has no pain at the site although he does have pain on the left anterior shin. His hemoglobin A1c is 8.0 in March 2019. 04/26/18 on evaluation today patient presents for follow-up concerning his life into your [place] also as well as his left great toe also. Fortunately the shin actually appears to be doing better on evaluation today in my opinion I'm not seeing as much in the way of fluffernutter on the surface although it does still require debridement today this is improved. Size wise it's really not much different. With that being said the total ulcer actually appears to be a little bit more macerated today I'm not sure why they state that several days ago when this was changed that was not the case. Fortunately is not having severe pain although both areas do hurt the shin is worse. 05/03/18 on evaluation today patient appears to be doing better in regard to his left first toe and left shin ulcers. He is been tolerating the dressing changes without complication and the good news is this seems to be showing signs of getting better. Overall I'm very pleased with the progress that has been made over the  past week. Patient is still having pain especially in regard to the left shin. 05/10/18 on evaluation today patient appears to be doing rather well in regard to his two ulcers. The toe ulcer appears to be smaller he does have some undermining however in this had to be cleaned out just a little bit. With that being said overall I feel like this is showing signs of improvement. The left anterior shin ulcer also showing signs of improvement at this time. There does not appear to be any evidence of infection which is good news. 05/21/18 on evaluation today patient appears to be doing very well in regard to his  left shin ulcer. He has been tolerating the dressing changes without complication. With that being said he is having issues at this point with his great toe on the left. We have not x-rayed that at this point I think we may need to. Nonetheless he has pain really at the roughly 5 o'clock location but nowhere else and I really cannot explain this I do not see any obvious form body. Nonetheless he continues to have issues with getting this area to close it's not progressing as nicely. 05/28/18 on evaluation today patient appears to be doing very well in regard to his left lower extremity anterior ulcer. He has been tolerating the dressing changes without complication. Overall this is making excellent progress with the collagen dressing. His left great toe x-ray did return and showed that he had no if you that normality. He did have a plantar heel spur [name], Marcus T. ([id number]) but this is more degenerative. Otherwise just arthritis was noted no osteomyelitis and no obvious form body was visualized. Overall things seem to be going fairly well. With that being said he still has pain when looking at the toe from the plantar aspect at roughly the six back to the 4 o'clock location which is not quite as bad as last time but still hurts more than any other part of his wound. It does appear the Iodoflex has been of benefit however. 06/04/18 on evaluation today patient appears to be doing excellent in regard to his left anterior shin ulcer. This has made excellent progress and is showing signs of improving in fact very close to completely closing in healing. Nonetheless unfortunately his left great toe has not faired quite as well as far as progress is concerned. He notes that after last week's debridement he actually seem to be doing a little bit better for a couple of days until Thursday of last week when he had increases in discomfort as well is changes in the draining. He states that the drainage has  been more green in color according to his wife and what he has seen. He's also had more discomfort. He relates to me again that he really feels like this all started when he stepped on a nail although he does not exactly remember it it seemed to be a puncture wound that he feels may be the culprit for why all this started. Nonetheless I am getting more concerned about the possibility of osteomyelitis despite the fact that the x-ray was negative I was hopeful in this interim since last week to this week that we would see some improvement in the overall appearance of the toe. Unfortunately if anything I feel like it's a little worse. It was macerated but I think we can attribute this to the fact that the patient did have an episode last night we got stuck in the  rain and he thinks the dressing got wet. He did not change it I told him in the future if this were to happen to please go ahead and change the dressing it won't hurt to change it more frequently in that situation. Otherwise other than the maceration I do not see any evidence of anything being any worse but it also does not appear to be any better. 06/11/18 on evaluation today patient's wounds both the great toe as well as the left shin both on the left side show signs of good improvement. Fortunately the patient's culture came back negative in regard to the toe and there does not appear to be any evidence of infection. His MRI is actually scheduled for 06/19/18. Fortunately this is right before I see him next week and then we can go to the results of the culture at that point. Patient is in agreement with that plan. With that being said otherwise he has been showing signs of having less pain in regard to the toe which is also good news my hope is that he does not have osteomyelitis. 06/18/18 on evaluation today patient appears to be doing very well in regard to his left lower extremity ulcer. He also states at this point in time that he has been  tolerating the dressing changes with the toe it appears also to be doing very well at this point. In general I'm happy with the progress that he seems to be making. With that being said he still continues to have pain in regard to the great toe ulcer fortunately this does not seem to be as significant as it was in the beginning but still nonetheless he is having some pain. No fevers, chills, nausea, or vomiting noted at this time. 06/25/18 on evaluation today patient actually appears to be doing very well in regard to his left anterior shin ulcer. He's been tolerating the dressing changes without complication in this seems to have done extremely well. In fact this appears to be healed. With that being said his toe ulcer still continues to get in trouble fortunately not as much as has been in the past and there have been some signs of improvement and better granulation although I still think he's getting a lot of callous to the area which seems to indicate a lot of pressure and friction. I believe he may benefit from a total contact cast especially now that the left shin ulcer has healed. Patient History Information obtained from Patient. Family History Cancer - Father, Diabetes - Father, Heart Disease - Mother,Father, Hypertension - Mother, Kidney Disease - Child, No family history of Lung Disease, Seizures, Stroke, Thyroid Problems, Tuberculosis. Social History Current every day smoker - 1 - 1/2 pack daily, Marital Status - Married, Alcohol Use - Rarely, Drug Use - No History, Caffeine Use - Daily - coffee. Review of Systems (ROS) Constitutional Symptoms (General Health) Denies complaints or symptoms of Fever, Chills. Respiratory The patient has no complaints or symptoms. Cardiovascular Complains or has symptoms of LE edema. Psychiatric [name], [name] ([id number]) The patient has no complaints or symptoms. Objective Constitutional Well-nourished and well-hydrated in no acute  distress. Vitals Time Taken: 8:46 AM, Height: 69 in, Weight: 238 lbs, BMI: 35.1, Temperature: 98.2 F, Pulse: 56 bpm, Respiratory Rate: 16 breaths/min, Blood Pressure: 108/56 mmHg. Respiratory normal breathing without difficulty. Psychiatric this patient is able to make decisions and demonstrates good insight into disease process. Alert and Oriented x 3. pleasant and cooperative. General Notes: Patient's wound  at this time shows good evidence of epithelialization healing in regard to the left anterior shin. He has been tolerating the dressing changes without complication. In regard to his left first toe he does have slough noted on the surface there's some callous surrounding which did require sharp debridement. I performed this day without complication post debridement the wound bed appears to be doing much better. Integumentary (Hair, Skin) Wound #1 status is Healed - Epithelialized. Original cause of wound was Trauma. The wound is located on the Left,Midline,Anterior Lower Leg. The wound measures 0cm length x 0cm width x 0cm depth; 0cm^2 area and 0cm^3 volume. There is Fat Layer (Subcutaneous Tissue) Exposed exposed. There is no tunneling or undermining noted. There is a none present amount of drainage noted. The wound margin is distinct with the outline attached to the wound base. There is small (1-33%) red, hyper - granulation within the wound bed. There is no necrotic tissue within the wound bed. The periwound skin appearance exhibited: Hemosiderin Staining. The periwound skin appearance did not exhibit: Callus, Crepitus, Excoriation, Induration, Rash, Scarring, Dry/Scaly, Maceration, Atrophie Blanche, Cyanosis, Ecchymosis, Mottled, Pallor, Rubor, Erythema. Periwound temperature was noted as No Abnormality. Wound #2 status is Open. Original cause of wound was Trauma. The wound is located on the Left Toe Great. The wound measures 0.2cm length x 0.2cm width x 0.4cm depth; 0.031cm^2 area  and 0.013cm^3 volume. There is no tunneling or undermining noted. There is a small amount of serosanguineous drainage noted. The wound margin is flat and intact. There is medium (34-66%) pink granulation within the wound bed. There is a medium (34-66%) amount of necrotic tissue within the wound bed including Adherent Slough. The periwound skin appearance exhibited: Callus. The periwound skin appearance did not exhibit: Crepitus, Excoriation, Induration, Rash, Scarring, Dry/Scaly, Maceration, Atrophie Blanche, Cyanosis, Ecchymosis, Hemosiderin Staining, Mottled, Pallor, Rubor, Erythema. Periwound temperature was noted as No Abnormality. The periwound has tenderness on palpation. 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 Anesthetic (add to Medication List): Wound #2 Left Toe Great: Topical Lidocaine 4% cream applied to wound bed prior to debridement (In Clinic Only). Primary Wound Dressing: Wound #2 Left Toe Great: Silver Collagen - moisten lightly with saline Secondary Dressing: Wound #2 Left Toe Great: Gauze and Kerlix/Conform - secure with coban Foam Dressing Change Frequency: Wound #2 Left Toe Great: Change dressing every other day. Follow-up Appointments: Wound #2 Left Toe Great: Return Appointment in 1 week. Nurse Visit as needed Off-Loading: Wound #2 Left Toe Great: Other: - Keep as much pressure off of toe as you  can. Additional Orders / Instructions: Wound #2 Left Toe Great: Stop Smoking Vitamin A; Vitamin C, Zinc - Please add a multivitamin with 100% of these Increase protein intake. Activity as tolerated The following medication(s) was prescribed: lidocaine topical 4 % cream 1 1 cream topical was prescribed at facility I'm gonna suggest currently that we continue with the Current wound care measures for the next week. Patients in agreement with plan. We will subsequently consider the total contact cast  for next week which I think would be beneficial again for the toe the patient in agreement with this plan. We will see where he stands at that point. Please see above for specific wound care orders. We will see patient for re-evaluation in 1 week(s) here in the clinic. If anything worsens or changes patient will contact our office for additional recommendations. [name], [name] ([id number]) Electronic Signature(s) Signed: 06/26/2018 1:22:53 AM By: Worthy Keeler PA-C Entered By: Worthy Keeler on 06/25/2018 10:09:19 [name], [name] ([id number]) -------------------------------------------------------------------------------- ROS/PFSH Details Patient Name: [name]. Date of Service: 06/25/2018 8:45 AM Medical Record Number: [id number] Patient Account Number: [id number] Date of Birth/Sex: 1951-03-19 (67 y.o. M) Treating RN: Ahmed Prima Primary Care Provider: BESS, KATY Other Clinician: Referring Provider: BESS, KATY Treating Provider/Extender: STONE III, Gustabo Gordillo Weeks in Treatment: 9 Information Obtained From Patient Wound History Do you currently have one or more open woundso Yes How many open wounds do you currently haveo 2 Approximately how long have you had your woundso 2 weeks How have you been treating your wound(s) until nowo yes Doctor Has your wound(s) ever healed and then re-openedo No Have you had any lab work done in the past montho No Have you tested positive for an  antibiotic resistant organism (MRSA, VRE)o No Have you tested positive for osteomyelitis (bone infection)o No Have you had any tests for circulation on your legso No Have you had other problems associated with your woundso Infection, Swelling Constitutional Symptoms (General Health) Complaints and Symptoms: Negative for: Fever; Chills Cardiovascular Complaints and Symptoms: Positive for: LE edema Medical History: Positive for: Arrhythmia - pacemaker; Coronary Artery Disease; Hypertension; Myocardial Infarction Negative for: Deep Vein Thrombosis; Hypotension; Peripheral Arterial Disease; Peripheral Venous Disease; Phlebitis; Vasculitis Eyes Medical History: Negative for: Cataracts; Glaucoma; Optic Neuritis Ear/Nose/Mouth/Throat Medical History: Negative for: Chronic sinus problems/congestion Hematologic/Lymphatic Medical History: Positive for: Lymphedema Negative for: Anemia; Hemophilia; Human Immunodeficiency Virus; Sickle Cell Disease Respiratory Complaints and Symptoms: No Complaints or Symptoms Wisnieski, [name] ([id number]) Medical History: Negative for: Aspiration; Asthma; Chronic Obstructive Pulmonary Disease (COPD); Pneumothorax; Sleep Apnea; Tuberculosis Gastrointestinal Medical History: Negative for: Cirrhosis ; Colitis; Crohnos; Hepatitis A; Hepatitis B; Hepatitis C Endocrine Medical History: Positive for: Type II Diabetes Negative for: Type I Diabetes Time with diabetes: 5 years Treated with: Oral agents Blood sugar tested every day: Yes Tested : daily AM Blood sugar testing results: Breakfast: 174 Genitourinary Medical History: Negative for: End Stage Renal Disease Immunological Medical History: Negative for: Lupus Erythematosus; Raynaudos; Scleroderma Integumentary (Skin) Medical History: Negative for: History of Burn; History of pressure wounds Musculoskeletal Medical History: Negative for: Gout; Rheumatoid Arthritis; Osteoarthritis;  Osteomyelitis Neurologic Medical History: Positive for: Neuropathy Negative for: Dementia; Quadriplegia; Paraplegia; Seizure Disorder Oncologic Medical History: Positive for: Received Chemotherapy; Received Radiation - Seeds prostate Psychiatric Complaints and Symptoms: No Complaints or Symptoms Medical History: Negative for: Anorexia/bulimia; Confinement Anxiety Plascencia, GRAEDEN BITNER ([id number]) Immunizations Pneumococcal Vaccine: Received Pneumococcal Vaccination: Yes Tetanus Vaccine: Last tetanus shot: 12/25/2017 Implantable Devices Family and Social History Cancer: Yes - Father; Diabetes: Yes - Father; Heart Disease: Yes - Mother,Father; Hypertension: Yes - Mother; Kidney Disease: Yes - Child; Lung Disease: No; Seizures: No; Stroke: No; Thyroid Problems: No; Tuberculosis: No; Current every day smoker - 1 - 1/2 pack daily; Marital Status - Married; Alcohol Use: Rarely; Drug Use: No History; Caffeine Use: Daily - coffee; Financial Concerns: No; Food, Clothing or Shelter Needs: No; Support System Lacking: No; Transportation Concerns: No Physician Affirmation I have reviewed and  agree with the above information. Electronic Signature(s) Signed: 06/25/2018 4:10:39 PM By: Alric Quan Signed: 06/26/2018 1:22:53 AM By: Worthy Keeler PA-C Entered By: Worthy Keeler on 06/25/2018 10:08:28 [name] ([id number]) -------------------------------------------------------------------------------- SuperBill Details Patient Name: [name]. Date of Service: 06/25/2018 Medical Record Number: [id number] Patient Account Number: [id number] Date of Birth/Sex: 17-Oct-1951 (67 y.o. M) Treating RN: Ahmed Prima Primary Care Provider: BESS, KATY Other Clinician: Referring Provider: BESS, KATY Treating Provider/Extender: STONE III, Danny Yackley Weeks in Treatment: 9 Diagnosis Coding ICD-10 Codes Code Description I87.2 Venous insufficiency (chronic) (peripheral) S81.812A Laceration without  foreign body, left lower leg, initial encounter L97.822 Non-pressure chronic ulcer of other part of left lower leg with fat layer exposed L97.521 Non-pressure chronic ulcer of other part of left foot limited to breakdown of skin E11.621 Type 2 diabetes mellitus with foot ulcer I63.9 Cerebral infarction, unspecified I10 Essential (primary) hypertension Facility Procedures CPT4 Code: [id number] Description: 99213 - WOUND CARE VISIT-LEV 3 EST PT Modifier: Quantity: 1 Physician Procedures CPT4 Code Description: [id number] - WC PHYS LEVEL 3 - EST PT ICD-10 Diagnosis Description I87.2 Venous insufficiency (chronic) (peripheral) X52.841L Laceration without foreign body, left lower leg, initial encou L97.822 Non-pressure chronic ulcer of  other part of left lower leg wit L97.521 Non-pressure chronic ulcer of other part of left foot limited Modifier: nter h fat layer expose to breakdown of sk Quantity: 1 d in Electronic Signature(s) Signed: 06/26/2018 3:49:29 PM By: Alric Quan Previous Signature: 06/26/2018 1:22:53 AM Version By: Worthy Keeler PA-C Entered By: Alric Quan on 06/26/2018 15:02:00

## 2018-06-27 NOTE — Progress Notes (Signed)
BO, TEICHER ([id number]) Visit Report for 06/25/2018 Arrival Information Details Patient Name: [name], [name]. Date of Service: 06/25/2018 8:45 AM Medical Record Number: [id number] Patient Account Number: [id number] Date of Birth/Sex: 03-13-1951 (67 y.o. M) Treating RN: Montey Hora Primary Care Earlyn Sylvan: BESS, KATY Other Clinician: Referring Kathrene Sinopoli: BESS, KATY Treating Hadleigh Felber/Extender: STONE III, HOYT Weeks in Treatment: 9 Visit Information History Since Last Visit Added Marcus deleted any medications: No Patient Arrived: Ambulatory Any new allergies Marcus adverse reactions: No Arrival Time: 08:44 Had a fall Marcus experienced change in No Accompanied By: self activities of daily living that may affect Transfer Assistance: None risk of falls: Patient Identification Verified: Yes Signs Marcus symptoms of abuse/neglect since last visito No Secondary Verification Process Completed: Yes Hospitalized since last visit: No Patient Has Alerts: Yes Implantable device outside of the clinic excluding No Patient Alerts: Type II Diabetic cellular tissue based products placed in the center since last visit: Has Dressing in Place as Prescribed: Yes Has Compression in Place as Prescribed: Yes Pain Present Now: No Electronic Signature(s) Signed: 06/25/2018 4:10:25 PM By: Montey Hora Entered By: Montey Hora on 06/25/2018 08:45:10 Gravois, [name] ([id number]) -------------------------------------------------------------------------------- Clinic Level of Care Assessment Details Patient Name: Marcus Loll T. Date of Service: 06/25/2018 8:45 AM Medical Record Number: [id number] Patient Account Number: [id number] Date of Birth/Sex: 02-04-1951 (67 y.o. M) Treating RN: Ahmed Prima Primary Care Allysa Governale: BESS, KATY Other Clinician: Referring Kalyn Dimattia: BESS, KATY Treating Daje Stark/Extender: STONE III, HOYT Weeks in Treatment: 9 Clinic Level of Care Assessment Items TOOL 4 Quantity Score X -  Use when only an EandM is performed on FOLLOW-UP visit 1 0 ASSESSMENTS - Nursing Assessment / Reassessment X - Reassessment of Co-morbidities (includes updates in patient status) 1 10 X- 1 5 Reassessment of Adherence to Treatment Plan ASSESSMENTS - Wound and Skin Assessment / Reassessment X - Simple Wound Assessment / Reassessment - one wound 1 5 []  - 0 Complex Wound Assessment / Reassessment - multiple wounds []  - 0 Dermatologic / Skin Assessment (not related to wound area) ASSESSMENTS - Focused Assessment []  - Circumferential Edema Measurements - multi extremities 0 []  - 0 Nutritional Assessment / Counseling / Intervention []  - 0 Lower Extremity Assessment (monofilament, tuning fork, pulses) []  - 0 Peripheral Arterial Disease Assessment (using hand held doppler) ASSESSMENTS - Ostomy and/Marcus Continence Assessment and Care []  - Incontinence Assessment and Management 0 []  - 0 Ostomy Care Assessment and Management (repouching, etc.) PROCESS - Coordination of Care X - Simple Patient / Family Education for ongoing care 1 15 []  - 0 Complex (extensive) Patient / Family Education for ongoing care []  - 0 Staff obtains Programmer, systems, Records, Test Results / Process Orders []  - 0 Staff telephones HHA, Nursing Homes / Clarify orders / etc []  - 0 Routine Transfer to another Facility (non-emergent condition) []  - 0 Routine Hospital Admission (non-emergent condition) []  - 0 New Admissions / Biomedical engineer / Ordering NPWT, Apligraf, etc. []  - 0 Emergency Hospital Admission (emergent condition) X- 1 10 Simple Discharge Coordination LEIBY, PIGEON ([id number]) []  - 0 Complex (extensive) Discharge Coordination PROCESS - Special Needs []  - Pediatric / Minor Patient Management 0 []  - 0 Isolation Patient Management []  - 0 Hearing / Language / Visual special needs []  - 0 Assessment of Community assistance (transportation, D/C planning, etc.) []  - 0 Additional assistance / Altered  mentation []  - 0 Support Surface(s) Assessment (bed, cushion, seat, etc.) INTERVENTIONS - Wound Cleansing / Measurement X - Simple Wound Cleansing - one  wound 1 5 []  - 0 Complex Wound Cleansing - multiple wounds X- 1 5 Wound Imaging (photographs - any number of wounds) []  - 0 Wound Tracing (instead of photographs) X- 1 5 Simple Wound Measurement - one wound []  - 0 Complex Wound Measurement - multiple wounds INTERVENTIONS - Wound Dressings X - Small Wound Dressing one Marcus multiple wounds 1 10 []  - 0 Medium Wound Dressing one Marcus multiple wounds []  - 0 Large Wound Dressing one Marcus multiple wounds X- 1 5 Application of Medications - topical []  - 0 Application of Medications - injection INTERVENTIONS - Miscellaneous []  - External ear exam 0 []  - 0 Specimen Collection (cultures, biopsies, blood, body fluids, etc.) []  - 0 Specimen(s) / Culture(s) sent Marcus taken to Lab for analysis []  - 0 Patient Transfer (multiple staff / Civil Service fast streamer / Similar devices) []  - 0 Simple Staple / Suture removal (25 Marcus less) []  - 0 Complex Staple / Suture removal (26 Marcus more) []  - 0 Hypo / Hyperglycemic Management (close monitor of Blood Glucose) []  - 0 Ankle / Brachial Index (ABI) - do not check if billed separately X- 1 5 Vital Signs [name], Marcus CANNING. ([id number]) Has the patient been seen at the hospital within the last three years: Yes Total Score: 80 Level Of Care: New/Established - Level 3 Electronic Signature(s) Signed: 06/26/2018 3:49:29 PM By: Alric Quan Entered By: Alric Quan on 06/26/2018 15:01:41 Lanum, [name] ([id number]) -------------------------------------------------------------------------------- Encounter Discharge Information Details Patient Name: Marcus Loll T. Date of Service: 06/25/2018 8:45 AM Medical Record Number: [id number] Patient Account Number: [id number] Date of Birth/Sex: November 08, 1951 (67 y.o. M) Treating RN: Roger Shelter Primary Care Lagretta Loseke:  BESS, KATY Other Clinician: Referring Randell Teare: BESS, KATY Treating Lafawn Lenoir/Extender: Melburn Hake, HOYT Weeks in Treatment: 9 Encounter Discharge Information Items Discharge Condition: Stable Ambulatory Status: Ambulatory Discharge Destination: Home Transportation: Private Auto Schedule Follow-up Appointment: Yes Clinical Summary of Care: Electronic Signature(s) Signed: 06/26/2018 3:34:51 PM By: Roger Shelter Entered By: Roger Shelter on 06/25/2018 09:42:09 Dwyer, [name] ([id number]) -------------------------------------------------------------------------------- Lower Extremity Assessment Details Patient Name: Marcus Loll T. Date of Service: 06/25/2018 8:45 AM Medical Record Number: [id number] Patient Account Number: [id number] Date of Birth/Sex: 05-20-1951 (67 y.o. M) Treating RN: Montey Hora Primary Care Sandeep Delagarza: BESS, KATY Other Clinician: Referring Zamiya Dillard: BESS, KATY Treating Berlynn Warsame/Extender: STONE III, HOYT Weeks in Treatment: 9 Edema Assessment Assessed: [Left: No] [Right: No] [Left: Edema] [Right: :] Calf Left: Right: Point of Measurement: 34 cm From Medial Instep 38.2 cm cm Ankle Left: Right: Point of Measurement: 11 cm From Medial Instep 24.2 cm cm Vascular Assessment Pulses: Dorsalis Pedis Palpable: [Left:Yes] Posterior Tibial Extremity colors, hair growth, and conditions: Extremity Color: [Left:Hyperpigmented] Hair Growth on Extremity: [Left:No] Temperature of Extremity: [Left:Warm] Capillary Refill: [Left:< 3 seconds] Toe Nail Assessment Left: Right: Thick: Yes Discolored: Yes Deformed: No Improper Length and Hygiene: Yes Electronic Signature(s) Signed: 06/25/2018 4:10:25 PM By: Montey Hora Entered By: Montey Hora on 06/25/2018 08:55:19 Mcmiller, [name] ([id number]) -------------------------------------------------------------------------------- Multi Wound Chart Details Patient Name: Marcus Loll T. Date of Service: 06/25/2018  8:45 AM Medical Record Number: [id number] Patient Account Number: [id number] Date of Birth/Sex: Aug 27, 1951 (67 y.o. M) Treating RN: Ahmed Prima Primary Care Darwin Guastella: BESS, KATY Other Clinician: Referring Dayon Witt: BESS, KATY Treating Renella Steig/Extender: STONE III, HOYT Weeks in Treatment: 9 Vital Signs Height(in): 69 Pulse(bpm): 56 Weight(lbs): 238 Blood Pressure(mmHg): 108/56 Body Mass Index(BMI): 35 Temperature(F): 98.2 Respiratory Rate 16 (breaths/min): Photos: [1:No Photos] [2:No Photos] [N/A:N/A] Wound Location: [1:Left Lower Leg - Midline,  Anterior] [2:Left Toe Great] [N/A:N/A] Wounding Event: [1:Trauma] [2:Trauma] [N/A:N/A] Primary Etiology: [1:Diabetic Wound/Ulcer of the Lower Extremity] [2:Diabetic Wound/Ulcer of the Lower Extremity] [N/A:N/A] Secondary Etiology: [1:Trauma, Other] [2:Trauma, Other] [N/A:N/A] Comorbid History: [1:Lymphedema, Arrhythmia, Coronary Artery Disease, Hypertension, Myocardial Infarction, Type II Diabetes, Neuropathy, Received Chemotherapy, Received Radiation] [2:Lymphedema, Arrhythmia, Coronary Artery Disease, Hypertension,  Myocardial Infarction, Type II Diabetes, Neuropathy, Received Chemotherapy, Received Radiation] [N/A:N/A] Date Acquired: [1:04/05/2018] [2:12/25/2017] [N/A:N/A] Weeks of Treatment: [1:9] [2:9] [N/A:N/A] Wound Status: [1:Open] [2:Open] [N/A:N/A] Pending Amputation on [1:No] [2:Yes] [N/A:N/A] Presentation: Measurements L x W x D [1:0.1x0.1x0.1] [2:0.2x0.2x0.4] [N/A:N/A] (cm) Area (cm) : [1:0.008] [2:0.031] [N/A:N/A] Volume (cm) : [1:0.001] [2:0.013] [N/A:N/A] % Reduction in Area: [1:99.70%] [2:34.00%] [N/A:N/A] % Reduction in Volume: [1:99.70%] [2:-160.00%] [N/A:N/A] Classification: [1:Grade 1] [2:Grade 1] [N/A:N/A] Exudate Amount: [1:None Present] [2:Small] [N/A:N/A] Exudate Type: [1:N/A] [2:Serosanguineous] [N/A:N/A] Exudate Color: [1:N/A] [2:red, brown] [N/A:N/A] Wound Margin: [1:Distinct, outline attached] [2:Flat  and Intact] [N/A:N/A] Granulation Amount: [1:Small (1-33%)] [2:Medium (34-66%)] [N/A:N/A] Granulation Quality: [1:Red, Hyper-granulation] [2:Pink] [N/A:N/A] Necrotic Amount: [1:None Present (0%)] [2:Medium (34-66%)] [N/A:N/A] Exposed Structures: [1:Fat Layer (Subcutaneous Tissue) Exposed: Yes] [2:Fascia: No Fat Layer (Subcutaneous Tissue) Exposed: No Tendon: No] [N/A:N/A] Muscle: No Joint: No Bone: No Epithelialization: Large (67-100%) Small (1-33%) N/A Periwound Skin Texture: Excoriation: No Callus: Yes N/A Induration: No Excoriation: No Callus: No Induration: No Crepitus: No Crepitus: No Rash: No Rash: No Scarring: No Scarring: No Periwound Skin Moisture: Maceration: No Maceration: No N/A Dry/Scaly: No Dry/Scaly: No Periwound Skin Color: Hemosiderin Staining: Yes Atrophie Blanche: No N/A Atrophie Blanche: No Cyanosis: No Cyanosis: No Ecchymosis: No Ecchymosis: No Erythema: No Erythema: No Hemosiderin Staining: No Mottled: No Mottled: No Pallor: No Pallor: No Rubor: No Rubor: No Temperature: No Abnormality No Abnormality N/A Tenderness on Palpation: No Yes N/A Wound Preparation: Ulcer Cleansing: Ulcer Cleansing: N/A Rinsed/Irrigated with Saline Rinsed/Irrigated with Saline Topical Anesthetic Applied: Topical Anesthetic Applied: Other: lidocaine 4% Other: lidocaine 4% Treatment Notes Electronic Signature(s) Signed: 06/25/2018 4:10:39 PM By: Alric Quan Entered By: Alric Quan on 06/25/2018 09:15:06 Jobson, [name] ([id number]) -------------------------------------------------------------------------------- [place] Details Patient Name: Marcus Lark. Date of Service: 06/25/2018 8:45 AM Medical Record Number: [id number] Patient Account Number: [id number] Date of Birth/Sex: 1951-11-21 (67 y.o. M) Treating RN: Ahmed Prima Primary Care Kisha Messman: BESS, KATY Other Clinician: Referring Omero Kowal: BESS, KATY Treating  Saajan Willmon/Extender: STONE III, HOYT Weeks in Treatment: 9 Active Inactive ` Abuse / Safety / Falls / Self Care Management Nursing Diagnoses: Potential for falls Goals: Patient will remain injury free related to falls Date Initiated: 04/19/2018 Target Resolution Date: 05/04/2018 Goal Status: Active Interventions: Assess fall risk on admission and as needed Notes: ` Nutrition Nursing Diagnoses: Impaired glucose control: actual Marcus potential Goals: Patient/caregiver agrees to and verbalizes understanding of need to use nutritional supplements and/Marcus vitamins as prescribed Date Initiated: 04/19/2018 Target Resolution Date: 06/07/2018 Goal Status: Active Patient/caregiver verbalizes understanding of need to maintain therapeutic glucose control per primary care physician Date Initiated: 04/19/2018 Target Resolution Date: 05/31/2018 Goal Status: Active Interventions: Assess HgA1c results as ordered upon admission and as needed Assess patient nutrition upon admission and as needed per policy Notes: ` Orientation to the Wound Care Program Nursing Diagnoses: Knowledge deficit related to the wound healing center program Goals: Patient/caregiver will verbalize understanding of the [place] [name], [name] ([id number]) Date Initiated: 04/19/2018 Target Resolution Date: 06/29/2018 Goal Status: Active Interventions: Provide education on orientation to the wound center Notes: ` Wound/Skin Impairment Nursing Diagnoses: Impaired tissue integrity Goals: Ulcer/skin breakdown  will heal within 14 weeks Date Initiated: 04/19/2018 Target Resolution Date: 06/29/2018 Goal Status: Active Interventions: Assess patient/caregiver ability to obtain necessary supplies Assess patient/caregiver ability to perform ulcer/skin care regimen upon admission and as needed Assess ulceration(s) every visit Notes: Electronic Signature(s) Signed: 06/25/2018 4:10:39 PM By: Alric Quan Entered  By: Alric Quan on 06/25/2018 09:14:43 [name], [name] ([id number]) -------------------------------------------------------------------------------- Pain Assessment Details Patient Name: Marcus Loll T. Date of Service: 06/25/2018 8:45 AM Medical Record Number: [id number] Patient Account Number: [id number] Date of Birth/Sex: [birth year] (67 y.o. M) Treating RN: Montey Hora Primary Care Tifany Hirsch: BESS, KATY Other Clinician: Referring Valoree Agent: BESS, KATY Treating Rasheida Broden/Extender: STONE III, HOYT Weeks in Treatment: 9 Active Problems Location of Pain Severity and Description of Pain Patient Has Paino Yes Site Locations Pain Location: Pain in Ulcers With Dressing Change: Yes Duration of the Pain. Constant / Intermittento Constant Pain Management and Medication Current Pain Management: Electronic Signature(s) Signed: 06/25/2018 4:10:25 PM By: Montey Hora Entered By: Montey Hora on 06/25/2018 08:46:11 Winfree, [name] ([id number]) -------------------------------------------------------------------------------- Patient/Caregiver Education Details Patient Name: Marcus Lark. Date of Service: 06/25/2018 8:45 AM Medical Record Number: [id number] Patient Account Number: [id number] Date of Birth/Gender: 07/08/1951 (67 y.o. M) Treating RN: Roger Shelter Primary Care Physician: BESS, KATY Other Clinician: Referring Physician: Lillard Anes Treating Physician/Extender: Melburn Hake, HOYT Weeks in Treatment: 9 Education Assessment Education Provided To: Patient Education Topics Provided Wound Debridement: Handouts: Wound Debridement Methods: Explain/Verbal Responses: State content correctly Wound/Skin Impairment: Handouts: Caring for Your Ulcer Methods: Explain/Verbal Responses: State content correctly Electronic Signature(s) Signed: 06/26/2018 3:34:51 PM By: Roger Shelter Entered By: Roger Shelter on 06/25/2018 09:42:27 Kasik, [name]  ([id number]) -------------------------------------------------------------------------------- Wound Assessment Details Patient Name: Marcus Loll T. Date of Service: 06/25/2018 8:45 AM Medical Record Number: [id number] Patient Account Number: [id number] Date of Birth/Sex: 1951-10-04 (67 y.o. M) Treating RN: Ahmed Prima Primary Care Bacilio Abascal: BESS, KATY Other Clinician: Referring Syra Sirmons: BESS, KATY Treating Allah Reason/Extender: STONE III, HOYT Weeks in Treatment: 9 Wound Status Wound Number: 1 Primary Diabetic Wound/Ulcer of the Lower Extremity Etiology: Wound Location: Left, Midline, Anterior Lower Leg Secondary Trauma, Other Wounding Event: Trauma Etiology: Date Acquired: 04/05/2018 Wound Healed - Epithelialized Weeks Of Treatment: 9 Status: Clustered Wound: No Comorbid Lymphedema, Arrhythmia, Coronary Artery History: Disease, Hypertension, Myocardial Infarction, Type II Diabetes, Neuropathy, Received Chemotherapy, Received Radiation Photos Photo Uploaded By: Montey Hora on 06/25/2018 09:27:56 Wound Measurements Length: (cm) 0 % R Width: (cm) 0 % R Depth: (cm) 0 Epi Area: (cm) 0 Tu Volume: (cm) 0 Un eduction in Area: 100% eduction in Volume: 100% thelialization: Large (67-100%) nneling: No dermining: No Wound Description Classification: Grade 1 Wound Margin: Distinct, outline attached Exudate Amount: None Present Foul Odor After Cleansing: No Slough/Fibrino No Wound Bed Granulation Amount: Small (1-33%) Exposed Structure Granulation Quality: Red, Hyper-granulation Fat Layer (Subcutaneous Tissue) Exposed: Yes Necrotic Amount: None Present (0%) Periwound Skin Texture Texture Color No Abnormalities Noted: No No Abnormalities Noted: No Callus: No Atrophie Blanche: No Lupu, [name] ([id number]) Crepitus: No Cyanosis: No Excoriation: No Ecchymosis: No Induration: No Erythema: No Rash: No Hemosiderin Staining: Yes Scarring: No Mottled:  No Pallor: No Moisture Rubor: No No Abnormalities Noted: No Dry / Scaly: No Temperature / Pain Maceration: No Temperature: No Abnormality Wound Preparation Ulcer Cleansing: Rinsed/Irrigated with Saline Topical Anesthetic Applied: Other: lidocaine 4%, Electronic Signature(s) Signed: 06/25/2018 4:10:39 PM By: Alric Quan Entered By: Alric Quan on 06/25/2018 09:20:20 [place], [name] ([id number]) -------------------------------------------------------------------------------- Wound Assessment Details Patient Name: Marcus Loll T. Date of Service:  06/25/2018 8:45 AM Medical Record Number: [id number] Patient Account Number: [id number] Date of Birth/Sex: 09/06/1951 (67 y.o. M) Treating RN: Montey Hora Primary Care Eyoel Throgmorton: BESS, KATY Other Clinician: Referring Dee Maday: BESS, KATY Treating Sargun Rummell/Extender: STONE III, HOYT Weeks in Treatment: 9 Wound Status Wound Number: 2 Primary Diabetic Wound/Ulcer of the Lower Extremity Etiology: Wound Location: Left Toe Great Secondary Trauma, Other Wounding Event: Trauma Etiology: Date Acquired: 12/25/2017 Wound Open Weeks Of Treatment: 9 Status: Clustered Wound: No Comorbid Lymphedema, Arrhythmia, Coronary Artery Pending Amputation On Presentation History: Disease, Hypertension, Myocardial Infarction, Type II Diabetes, Neuropathy, Received Chemotherapy, Received Radiation Photos Photo Uploaded By: Montey Hora on 06/25/2018 09:27:57 Wound Measurements Length: (cm) 0.2 Width: (cm) 0.2 Depth: (cm) 0.4 Area: (cm) 0.031 Volume: (cm) 0.013 % Reduction in Area: 34% % Reduction in Volume: -160% Epithelialization: Small (1-33%) Tunneling: No Undermining: No Wound Description Classification: Grade 1 Wound Margin: Flat and Intact Exudate Amount: Small Exudate Type: Serosanguineous Exudate Color: red, brown Foul Odor After Cleansing: No Slough/Fibrino Yes Wound Bed Granulation Amount: Medium (34-66%) Exposed  Structure Granulation Quality: Pink Fascia Exposed: No Necrotic Amount: Medium (34-66%) Fat Layer (Subcutaneous Tissue) Exposed: No Necrotic Quality: Adherent Slough Tendon Exposed: No Muscle Exposed: No Joint Exposed: No Vanengen, [name] ([id number]) Bone Exposed: No Periwound Skin Texture Texture Color No Abnormalities Noted: No No Abnormalities Noted: No Callus: Yes Atrophie Blanche: No Crepitus: No Cyanosis: No Excoriation: No Ecchymosis: No Induration: No Erythema: No Rash: No Hemosiderin Staining: No Scarring: No Mottled: No Pallor: No Moisture Rubor: No No Abnormalities Noted: No Dry / Scaly: No Temperature / Pain Maceration: No Temperature: No Abnormality Tenderness on Palpation: Yes Wound Preparation Ulcer Cleansing: Rinsed/Irrigated with Saline Topical Anesthetic Applied: Other: lidocaine 4%, Treatment Notes Wound #2 (Left Toe Great) 1. Cleansed with: Clean wound with Normal Saline 2. Anesthetic Topical Lidocaine 4% cream to wound bed prior to debridement 4. Dressing Applied: Prisma Ag 5. Secondary Dressing Applied Dry Gauze Foam Notes silvercell, foam with cut out, gauze then wrap with conform wrap with tape Electronic Signature(s) Signed: 06/25/2018 4:10:25 PM By: Montey Hora Entered By: Montey Hora on 06/25/2018 08:56:49 Holohan, [name] ([id number]) -------------------------------------------------------------------------------- [place] Details Patient Name: Marcus Loll T. Date of Service: 06/25/2018 8:45 AM Medical Record Number: [id number] Patient Account Number: [id number] Date of Birth/Sex: 1951-04-07 (67 y.o. M) Treating RN: Montey Hora Primary Care Emile Kyllo: BESS, KATY Other Clinician: Referring Mylynn Dinh: BESS, KATY Treating Prophet Renwick/Extender: STONE III, HOYT Weeks in Treatment: 9 Vital Signs Time Taken: 08:46 Temperature (F): 98.2 Height (in): 69 Pulse (bpm): 56 Weight (lbs): 238 Respiratory Rate (breaths/min):  16 Body Mass Index (BMI): 35.1 Blood Pressure (mmHg): 108/56 Reference Range: 80 - 120 mg / dl Electronic Signature(s) Signed: 06/25/2018 4:10:25 PM By: Montey Hora Entered By: Montey Hora on 06/25/2018 08:48:34

## 2018-07-02 ENCOUNTER — Encounter: Payer: PPO | Admitting: Physician Assistant

## 2018-07-02 ENCOUNTER — Ambulatory Visit
Admission: RE | Admit: 2018-07-02 | Discharge: 2018-07-02 | Disposition: A | Discharge status: Home / Self Care | Payer: PPO | Source: Ambulatory Visit | Attending: Adult Health | Admitting: Adult Health

## 2018-07-02 DIAGNOSIS — Z136 Encounter for screening for cardiovascular disorders: Secondary | ICD-10-CM | POA: Diagnosis not present

## 2018-07-02 DIAGNOSIS — E11621 Type 2 diabetes mellitus with foot ulcer: Secondary | ICD-10-CM | POA: Diagnosis not present

## 2018-07-02 DIAGNOSIS — I714 Abdominal aortic aneurysm, without rupture: Secondary | ICD-10-CM | POA: Diagnosis not present

## 2018-07-02 DIAGNOSIS — L97822 Non-pressure chronic ulcer of other part of left lower leg with fat layer exposed: Secondary | ICD-10-CM | POA: Diagnosis not present

## 2018-07-02 DIAGNOSIS — L97522 Non-pressure chronic ulcer of other part of left foot with fat layer exposed: Secondary | ICD-10-CM | POA: Diagnosis not present

## 2018-07-03 NOTE — Progress Notes (Signed)
[name], [name] ([id number]) Visit Report for 07/02/2018 Arrival Information Details Patient Name: [name], [name]. Date of Service: 07/02/2018 3:45 PM Medical Record Number: [id number] Patient Account Number: [id number] Date of Birth/Sex: 1951/10/16 (67 y.o. M) Treating RN: Secundino Ginger Primary Care Tristian Sickinger: BESS, KATY Other Clinician: Referring Teyton Pattillo: BESS, KATY Treating Zylah Elsbernd/Extender: STONE III, HOYT Weeks in Treatment: 10 Visit Information History Since Last Visit Added or deleted any medications: No Patient Arrived: Ambulatory Any new allergies or adverse reactions: No Arrival Time: 16:12 Had a fall or experienced change in No Accompanied By: self activities of daily living that may affect Transfer Assistance: None risk of falls: Patient Identification Verified: Yes Signs or symptoms of abuse/neglect since last visito No Secondary Verification Process Completed: Yes Hospitalized since last visit: No Patient Has Alerts: Yes Implantable device outside of the clinic excluding No Patient Alerts: Type II Diabetic cellular tissue based products placed in the center since last visit: Has Dressing in Place as Prescribed: Yes Pain Present Now: No Electronic Signature(s) Signed: 07/02/2018 4:28:01 PM By: Secundino Ginger Entered By: Secundino Ginger on 07/02/2018 16:13:11 Goodridge, [name] ([id number]) -------------------------------------------------------------------------------- Encounter Discharge Information Details Patient Name: Marcus Loll T. Date of Service: 07/02/2018 3:45 PM Medical Record Number: [id number] Patient Account Number: [id number] Date of Birth/Sex: [birth year] (67 y.o. M) Treating RN: Roger Shelter Primary Care Larina Lieurance: BESS, KATY Other Clinician: Referring Twanna Resh: BESS, KATY Treating Leoni Goodness/Extender: Melburn Hake, HOYT Weeks in Treatment: 10 Encounter Discharge Information Items Discharge Condition: Stable Ambulatory Status: Ambulatory Discharge Destination:  Home Transportation: Private Auto Schedule Follow-up Appointment: Yes Clinical Summary of Care: Electronic Signature(s) Signed: 07/02/2018 4:55:40 PM By: Roger Shelter Entered By: Roger Shelter on 07/02/2018 16:44:28 Mathurin, [name] ([id number]) -------------------------------------------------------------------------------- Lower Extremity Assessment Details Patient Name: Marcus Loll T. Date of Service: 07/02/2018 3:45 PM Medical Record Number: [id number] Patient Account Number: [id number] Date of Birth/Sex: 08-27-1951 (67 y.o. M) Treating RN: Secundino Ginger Primary Care Aniyla Harling: BESS, KATY Other Clinician: Referring Crescencio Jozwiak: BESS, KATY Treating Greydis Stlouis/Extender: STONE III, HOYT Weeks in Treatment: 10 Edema Assessment Assessed: [Left: No] [Right: No] [Left: Edema] [Right: :] Calf Left: Right: Point of Measurement: cm From Medial Instep 40 cm cm Ankle Left: Right: Point of Measurement: cm From Medial Instep 27 cm cm Vascular Assessment Claudication: Claudication Assessment [Left:None] Pulses: Dorsalis Pedis Palpable: [Left:Yes] Posterior Tibial Extremity colors, hair growth, and conditions: Extremity Color: [Left:Hyperpigmented] Hair Growth on Extremity: [Left:No] Temperature of Extremity: [Left:Warm] Capillary Refill: [Left:< 3 seconds] Toe Nail Assessment Left: Right: Thick: Yes Discolored: Yes Deformed: No Improper Length and Hygiene: No Electronic Signature(s) Signed: 07/02/2018 4:28:01 PM By: Secundino Ginger Entered By: Secundino Ginger on 07/02/2018 16:24:03 Mcconaughey, [name] ([id number]) -------------------------------------------------------------------------------- Multi Wound Chart Details Patient Name: Marcus Loll T. Date of Service: 07/02/2018 3:45 PM Medical Record Number: [id number] Patient Account Number: [id number] Date of Birth/Sex: 1951-06-09 (67 y.o. M) Treating RN: Ahmed Prima Primary Care Antuane Eastridge: BESS, KATY Other Clinician: Referring Debanhi Blaker:  BESS, KATY Treating Salwa Bai/Extender: STONE III, HOYT Weeks in Treatment: 10 Vital Signs Height(in): 69 Pulse(bpm): 70 Weight(lbs): 238 Blood Pressure(mmHg): 118/62 Body Mass Index(BMI): 35 Temperature(F): 98.2 Respiratory Rate 16 (breaths/min): Photos: [N/A:N/A] Wound Location: Left Toe Great N/A N/A Wounding Event: Trauma N/A N/A Primary Etiology: Diabetic Wound/Ulcer of the N/A N/A Lower Extremity Secondary Etiology: Trauma, Other N/A N/A Comorbid History: Lymphedema, Arrhythmia, N/A N/A Coronary Artery Disease, Hypertension, Myocardial Infarction, Type II Diabetes, Neuropathy, Received Chemotherapy, Received Radiation Date Acquired: 12/25/2017 N/A N/A Weeks of Treatment: 10 N/A N/A Wound Status: Open N/A N/A Pending  Amputation on Yes N/A N/A Presentation: Measurements L x W x D 0.2x0.2x0.3 N/A N/A (cm) Area (cm) : 0.031 N/A N/A Volume (cm) : 0.009 N/A N/A % Reduction in Area: 34.00% N/A N/A % Reduction in Volume: -80.00% N/A N/A Position 1 (o'clock): 12 Maximum Distance 1 (cm): 0.3 Tunneling: Yes N/A N/A Classification: Grade 1 N/A N/A Exudate Amount: Small N/A N/A Exudate Type: Serosanguineous N/A N/A [name], Marcus T. ([id number]) Exudate Color: red, brown N/A N/A Wound Margin: Flat and Intact N/A N/A Granulation Amount: None Present (0%) N/A N/A Necrotic Amount: Medium (34-66%) N/A N/A Exposed Structures: Fascia: No N/A N/A Fat Layer (Subcutaneous Tissue) Exposed: No Tendon: No Muscle: No Joint: No Bone: No Epithelialization: Small (1-33%) N/A N/A Periwound Skin Texture: Callus: Yes N/A N/A Excoriation: No Induration: No Crepitus: No Rash: No Scarring: No Periwound Skin Moisture: Maceration: No N/A N/A Dry/Scaly: No Periwound Skin Color: Atrophie Blanche: No N/A N/A Cyanosis: No Ecchymosis: No Erythema: No Hemosiderin Staining: No Mottled: No Pallor: No Rubor: No Temperature: No Abnormality N/A N/A Tenderness on Palpation: Yes  N/A N/A Wound Preparation: Ulcer Cleansing: N/A N/A Rinsed/Irrigated with Saline Topical Anesthetic Applied: Other: lidocaine 4% Treatment Notes Electronic Signature(s) Signed: 07/02/2018 5:13:20 PM By: Alric Quan Entered By: Alric Quan on 07/02/2018 16:32:22 [name], [name] ([id number]) -------------------------------------------------------------------------------- [place] Details Patient Name: Marcus Lark. Date of Service: 07/02/2018 3:45 PM Medical Record Number: [id number] Patient Account Number: [id number] Date of Birth/Sex: 04/11/1951 (67 y.o. M) Treating RN: Ahmed Prima Primary Care Jenae Tomasello: BESS, KATY Other Clinician: Referring Hall Birchard: BESS, KATY Treating Caelin Rosen/Extender: STONE III, HOYT Weeks in Treatment: 10 Active Inactive ` Abuse / Safety / Falls / Self Care Management Nursing Diagnoses: Potential for falls Goals: Patient will remain injury free related to falls Date Initiated: 04/19/2018 Target Resolution Date: 05/04/2018 Goal Status: Active Interventions: Assess fall risk on admission and as needed Notes: ` Nutrition Nursing Diagnoses: Impaired glucose control: actual or potential Goals: Patient/caregiver agrees to and verbalizes understanding of need to use nutritional supplements and/or vitamins as prescribed Date Initiated: 04/19/2018 Target Resolution Date: 06/07/2018 Goal Status: Active Patient/caregiver verbalizes understanding of need to maintain therapeutic glucose control per primary care physician Date Initiated: 04/19/2018 Target Resolution Date: 05/31/2018 Goal Status: Active Interventions: Assess HgA1c results as ordered upon admission and as needed Assess patient nutrition upon admission and as needed per policy Notes: ` Orientation to the Wound Care Program Nursing Diagnoses: Knowledge deficit related to the wound healing center program Goals: Patient/caregiver will verbalize understanding of the  [place] [name], [name] ([id number]) Date Initiated: 04/19/2018 Target Resolution Date: 06/29/2018 Goal Status: Active Interventions: Provide education on orientation to the wound center Notes: ` Wound/Skin Impairment Nursing Diagnoses: Impaired tissue integrity Goals: Ulcer/skin breakdown will heal within 14 weeks Date Initiated: 04/19/2018 Target Resolution Date: 06/29/2018 Goal Status: Active Interventions: Assess patient/caregiver ability to obtain necessary supplies Assess patient/caregiver ability to perform ulcer/skin care regimen upon admission and as needed Assess ulceration(s) every visit Notes: Electronic Signature(s) Signed: 07/02/2018 5:13:20 PM By: Alric Quan Entered By: Alric Quan on 07/02/2018 16:31:45 [name], [name] ([id number]) -------------------------------------------------------------------------------- Pain Assessment Details Patient Name: Marcus Loll T. Date of Service: 07/02/2018 3:45 PM Medical Record Number: [id number] Patient Account Number: [id number] Date of Birth/Sex: [birth year] (67 y.o. M) Treating RN: Secundino Ginger Primary Care Masashi Snowdon: BESS, Valetta Fuller Other Clinician: Referring Khila Papp: BESS, KATY Treating Kaula Klenke/Extender: STONE III, HOYT Weeks in Treatment: 10 Active Problems Location of Pain Severity and Description of Pain Patient Has Paino  No Site Locations Pain Management and Medication Current Pain Management: Electronic Signature(s) Signed: 07/02/2018 4:28:01 PM By: Secundino Ginger Entered By: Secundino Ginger on 07/02/2018 16:14:20 [name], [name] ([id number]) -------------------------------------------------------------------------------- Patient/Caregiver Education Details Patient Name: Marcus Lark. Date of Service: 07/02/2018 3:45 PM Medical Record Number: [id number] Patient Account Number: [id number] Date of Birth/Gender: 01/06/1951 (67 y.o. M) Treating RN: Roger Shelter Primary Care Physician: BESS, KATY  Other Clinician: Referring Physician: Lillard Anes Treating Physician/Extender: Melburn Hake, HOYT Weeks in Treatment: 10 Education Assessment Education Provided To: Patient Education Topics Provided Wound Debridement: Methods: Explain/Verbal Responses: State content correctly Wound/Skin Impairment: Handouts: Caring for Your Ulcer Methods: Explain/Verbal Responses: State content correctly Electronic Signature(s) Signed: 07/02/2018 4:55:40 PM By: Roger Shelter Entered By: Roger Shelter on 07/02/2018 16:44:46 [name], [name] ([id number]) -------------------------------------------------------------------------------- Wound Assessment Details Patient Name: Marcus Loll T. Date of Service: 07/02/2018 3:45 PM Medical Record Number: [id number] Patient Account Number: [id number] Date of Birth/Sex: 1951/10/22 (67 y.o. M) Treating RN: Secundino Ginger Primary Care Kierstan Auer: BESS, KATY Other Clinician: Referring Vauda Salvucci: BESS, KATY Treating Mozel Burdett/Extender: STONE III, HOYT Weeks in Treatment: 10 Wound Status Wound Number: 2 Primary Diabetic Wound/Ulcer of the Lower Extremity Etiology: Wound Location: Left Toe Great Secondary Trauma, Other Wounding Event: Trauma Etiology: Date Acquired: 12/25/2017 Wound Open Weeks Of Treatment: 10 Status: Clustered Wound: No Comorbid Lymphedema, Arrhythmia, Coronary Artery Pending Amputation On Presentation History: Disease, Hypertension, Myocardial Infarction, Type II Diabetes, Neuropathy, Received Chemotherapy, Received Radiation Photos Photo Uploaded By: Secundino Ginger on 07/02/2018 16:27:35 Wound Measurements Length: (cm) 0.2 % Reduction Width: (cm) 0.2 % Reduction Depth: (cm) 0.3 Epitheliali Area: (cm) 0.031 Tunneling: Volume: (cm) 0.009 Positio Maximum in Area: 34% in Volume: -80% zation: Small (1-33%) Yes n (o'clock): 12 Distance: (cm) 0.3 Undermining: No Wound Description Classification: Grade 1 Foul Odor A Wound Margin: Flat and  Intact Slough/Fibr Exudate Amount: Small Exudate Type: Serosanguineous Exudate Color: red, brown fter Cleansing: No ino Yes Wound Bed Granulation Amount: None Present (0%) Exposed Structure Necrotic Amount: Medium (34-66%) Fascia Exposed: No Necrotic Quality: Adherent Slough Fat Layer (Subcutaneous Tissue) Exposed: No [name], Marcus T. ([id number]) Tendon Exposed: No Muscle Exposed: No Joint Exposed: No Bone Exposed: No Periwound Skin Texture Texture Color No Abnormalities Noted: No No Abnormalities Noted: No Callus: Yes Atrophie Blanche: No Crepitus: No Cyanosis: No Excoriation: No Ecchymosis: No Induration: No Erythema: No Rash: No Hemosiderin Staining: No Scarring: No Mottled: No Pallor: No Moisture Rubor: No No Abnormalities Noted: No Dry / Scaly: No Temperature / Pain Maceration: No Temperature: No Abnormality Tenderness on Palpation: Yes Wound Preparation Ulcer Cleansing: Rinsed/Irrigated with Saline Topical Anesthetic Applied: Other: lidocaine 4%, Treatment Notes Wound #2 (Left Toe Great) 1. Cleansed with: Clean wound with Normal Saline 2. Anesthetic Topical Lidocaine 4% cream to wound bed prior to debridement 4. Dressing Applied: Prisma Ag Notes foam with cut out, gauze then wrap with conform wrap with tape Electronic Signature(s) Signed: 07/02/2018 4:28:01 PM By: Secundino Ginger Entered By: Secundino Ginger on 07/02/2018 16:22:43 [name], [name] ([id number]) -------------------------------------------------------------------------------- [place] Details Patient Name: Marcus Loll T. Date of Service: 07/02/2018 3:45 PM Medical Record Number: [id number] Patient Account Number: [id number] Date of Birth/Sex: 29-Apr-1951 (67 y.o. M) Treating RN: Secundino Ginger Primary Care Adalynn Corne: BESS, KATY Other Clinician: Referring Tryce Surratt: BESS, KATY Treating Synethia Endicott/Extender: STONE III, HOYT Weeks in Treatment: 10 Vital Signs Time Taken: 16:05 Temperature (F):  98.2 Height (in): 69 Pulse (bpm): 77 Weight (lbs): 238 Respiratory Rate (breaths/min): 16 Body Mass Index (BMI): 35.1 Blood Pressure (mmHg): 118/62 Reference  Range: 80 - 120 mg / dl Electronic Signature(s) Signed: 07/02/2018 4:28:01 PM By: Secundino Ginger Entered By: Secundino Ginger on 07/02/2018 16:15:03

## 2018-07-04 ENCOUNTER — Other Ambulatory Visit: Payer: Self-pay | Admitting: Urology

## 2018-07-04 ENCOUNTER — Ambulatory Visit (HOSPITAL_COMMUNITY)
Admission: RE | Admit: 2018-07-04 | Discharge: 2018-07-04 | Disposition: A | Discharge status: Home / Self Care | Payer: PPO | Source: Ambulatory Visit | Attending: Urology | Admitting: Urology

## 2018-07-04 ENCOUNTER — Encounter (HOSPITAL_COMMUNITY): Payer: Self-pay

## 2018-07-04 DIAGNOSIS — D3002 Benign neoplasm of left kidney: Secondary | ICD-10-CM | POA: Diagnosis not present

## 2018-07-04 LAB — POCT I-STAT CREATININE: Creatinine, Ser: 0.7 mg/dL (ref 0.61–1.24)

## 2018-07-04 MED ORDER — GADOBENATE DIMEGLUMINE 529 MG/ML IV SOLN: 20.0000 mL | Freq: Once | INTRAVENOUS | Status: DC | PRN

## 2018-07-04 NOTE — Progress Notes (Signed)
DAISHAWN, LAUF ([id number]) Visit Report for 07/02/2018 Chief Complaint Document Details Patient Name: [name], [name]. Date of Service: 07/02/2018 3:45 PM Medical Record Number: [id number] Patient Account Number: [id number] Date of Birth/Sex: 20-Jun-1951 (67 y.o. M) Treating RN: Ahmed Prima Primary Care Provider: BESS, Valetta Fuller Other Clinician: Referring Provider: BESS, KATY Treating Provider/Extender: STONE III, Darl Kuss Weeks in Treatment: 10 Information Obtained from: Patient Chief Complaint Left lower leg and left great toe ulcer Electronic Signature(s) Signed: 07/02/2018 11:53:57 PM By: Worthy Keeler PA-C Entered By: Worthy Keeler on 07/02/2018 16:00:16 [name], [name] ([id number]) -------------------------------------------------------------------------------- Debridement Details Patient Name: [name]. Date of Service: 07/02/2018 3:45 PM Medical Record Number: [id number] Patient Account Number: [id number] Date of Birth/Sex: 1951-04-14 (67 y.o. M) Treating RN: Ahmed Prima Primary Care Provider: BESS, KATY Other Clinician: Referring Provider: BESS, KATY Treating Provider/Extender: STONE III, Jarett Dralle Weeks in Treatment: 10 Debridement Performed for Wound #2 Left Toe Great Assessment: Performed By: Physician STONE III, Ninah Moccio E., PA-C Debridement Type: Debridement Severity of Tissue Pre Fat layer exposed Debridement: Pre-procedure Verification/Time Yes - 16:32 Out Taken: Start Time: 16:32 Pain Control: Lidocaine 4% Topical Solution Total Area Debrided (L x W): 0.2 (cm) x 0.2 (cm) = 0.04 (cm) Tissue and other material Viable, Non-Viable, Callus, Subcutaneous debrided: Level: Skin/Subcutaneous Tissue Debridement Description: Excisional Instrument: Curette Bleeding: Minimum Hemostasis Achieved: Pressure End Time: 16:35 Procedural Pain: 0 Post Procedural Pain: 0 Response to Treatment: Procedure was tolerated well Level of Consciousness: Awake and Alert Post  Debridement Measurements of Total Wound Length: (cm) 0.3 Width: (cm) 0.3 Depth: (cm) 0.3 Volume: (cm) 0.021 Character of Wound/Ulcer Post Debridement: Requires Further Debridement Severity of Tissue Post Debridement: Fat layer exposed Post Procedure Diagnosis Same as Pre-procedure Electronic Signature(s) Signed: 07/02/2018 5:13:20 PM By: Alric Quan Signed: 07/02/2018 11:53:57 PM By: Worthy Keeler PA-C Entered By: Alric Quan on 07/02/2018 16:35:59 [name], [name] ([id number]) -------------------------------------------------------------------------------- HPI Details Patient Name: Marcus Loll T. Date of Service: 07/02/2018 3:45 PM Medical Record Number: [id number] Patient Account Number: [id number] Date of Birth/Sex: 10-14-1951 (67 y.o. M) Treating RN: Ahmed Prima Primary Care Provider: BESS, KATY Other Clinician: Referring Provider: BESS, KATY Treating Provider/Extender: STONE III, Aaleyah Witherow Weeks in Treatment: 10 History of Present Illness Associated Signs and Symptoms: Patient has a history of chronic venous insufficiency, diabetes mellitus type II, its reform fraction, hypertension HPI Description: 04/19/18 on evaluation today patient presents initially concerning an ulcer of his left great toe and left anterior shin. He states that the shin was definitely a trauma injury where he struck this on a metal pole injuring leg. In regard to the distal left great toe he's really not sure what happened here although he does have a significant callous noted as well. He does have a pacemaker. Fortunately his ABI was 0.98 on the right and 0.83 on the left this appears to be doing fairly well. Overall I'm pleased with that. Nonetheless he states that this really has not seem to be improving. He has been tolerating the dressing changes without complication. The left anterior leg ulcer began on 04/05/18 according to what he tells [place] today although one note I did have for review said  it was April 5. I'm inclined to believe it was the fifth she did have an office visit on 04/01/18 with his family nurse practitioner Faith Rogue. Nonetheless either way it's been going on this month for several weeks. The left great toe has actually been present since January 1 he does not know how this  occurred. Fortunately has no pain at the site although he does have pain on the left anterior shin. His hemoglobin A1c is 8.0 in March 2019. 04/26/18 on evaluation today patient presents for follow-up concerning his life into your [place] also as well as his left great toe also. Fortunately the shin actually appears to be doing better on evaluation today in my opinion I'm not seeing as much in the way of fluffernutter on the surface although it does still require debridement today this is improved. Size wise it's really not much different. With that being said the total ulcer actually appears to be a little bit more macerated today I'm not sure why they state that several days ago when this was changed that was not the case. Fortunately is not having severe pain although both areas do hurt the shin is worse. 05/03/18 on evaluation today patient appears to be doing better in regard to his left first toe and left shin ulcers. He is been tolerating the dressing changes without complication and the good news is this seems to be showing signs of getting better. Overall I'm very pleased with the progress that has been made over the past week. Patient is still having pain especially in regard to the left shin. 05/10/18 on evaluation today patient appears to be doing rather well in regard to his two ulcers. The toe ulcer appears to be smaller he does have some undermining however in this had to be cleaned out just a little bit. With that being said overall I feel like this is showing signs of improvement. The left anterior shin ulcer also showing signs of improvement at this time. There does not appear to  be any evidence of infection which is good news. 05/21/18 on evaluation today patient appears to be doing very well in regard to his left shin ulcer. He has been tolerating the dressing changes without complication. With that being said he is having issues at this point with his great toe on the left. We have not x-rayed that at this point I think we may need to. Nonetheless he has pain really at the roughly 5 o'clock location but nowhere else and I really cannot explain this I do not see any obvious form body. Nonetheless he continues to have issues with getting this area to close it's not progressing as nicely. 05/28/18 on evaluation today patient appears to be doing very well in regard to his left lower extremity anterior ulcer. He has been tolerating the dressing changes without complication. Overall this is making excellent progress with the collagen dressing. His left great toe x-ray did return and showed that he had no if you that normality. He did have a plantar heel spur but this is more degenerative. Otherwise just arthritis was noted no osteomyelitis and no obvious form body was visualized. Overall things seem to be going fairly well. With that being said he still has pain when looking at the toe from the plantar aspect at roughly the six back to the 4 o'clock location which is not quite as bad as last time but still hurts more than any other part of his wound. It does appear the Iodoflex has been of benefit however. 06/04/18 on evaluation today patient appears to be doing excellent in regard to his left anterior shin ulcer. This has made excellent progress and is showing signs of improving in fact very close to completely closing in healing. Nonetheless unfortunately his left great toe has not faired quite as  well as far as progress is concerned. He notes that after last week's Hypolite, C[employer]LETT. ([id number]) debridement he actually seem to be doing a little bit better for a couple of days  until Thursday of last week when he had increases in discomfort as well is changes in the draining. He states that the drainage has been more green in color according to his wife and what he has seen. He's also had more discomfort. He relates to me again that he really feels like this all started when he stepped on a nail although he does not exactly remember it it seemed to be a puncture wound that he feels may be the culprit for why all this started. Nonetheless I am getting more concerned about the possibility of osteomyelitis despite the fact that the x-ray was negative I was hopeful in this interim since last week to this week that we would see some improvement in the overall appearance of the toe. Unfortunately if anything I feel like it's a little worse. It was macerated but I think we can attribute this to the fact that the patient did have an episode last night we got stuck in the rain and he thinks the dressing got wet. He did not change it I told him in the future if this were to happen to please go ahead and change the dressing it won't hurt to change it more frequently in that situation. Otherwise other than the maceration I do not see any evidence of anything being any worse but it also does not appear to be any better. 06/11/18 on evaluation today patient's wounds both the great toe as well as the left shin both on the left side show signs of good improvement. Fortunately the patient's culture came back negative in regard to the toe and there does not appear to be any evidence of infection. His MRI is actually scheduled for 06/19/18. Fortunately this is right before I see him next week and then we can go to the results of the culture at that point. Patient is in agreement with that plan. With that being said otherwise he has been showing signs of having less pain in regard to the toe which is also good news my hope is that he does not have osteomyelitis. 06/18/18 on evaluation today  patient appears to be doing very well in regard to his left lower extremity ulcer. He also states at this point in time that he has been tolerating the dressing changes with the toe it appears also to be doing very well at this point. In general I'm happy with the progress that he seems to be making. With that being said he still continues to have pain in regard to the great toe ulcer fortunately this does not seem to be as significant as it was in the beginning but still nonetheless he is having some pain. No fevers, chills, nausea, or vomiting noted at this time. 06/25/18 on evaluation today patient actually appears to be doing very well in regard to his left anterior shin ulcer. He's been tolerating the dressing changes without complication in this seems to have done extremely well. In fact this appears to be healed. With that being said his toe ulcer still continues to get in trouble fortunately not as much as has been in the past and there have been some signs of improvement and better granulation although I still think he's getting a lot of callous to the area which seems to indicate a  lot of pressure and friction. I believe he may benefit from a total contact cast especially now that the left shin ulcer has healed. 07/02/18 on evaluation today patient appears to be doing rather well in regard to his left anterior shin ulcer which is actually completely healed at this point. With that being said he has left great toe ulcer actually appears to be doing better he states over the weekend he actually had a significant amount of drainage from the toe after it had been hurting for some time. He states when his wife change the dressing there was a lot of discharge and fluid he actually took a picture he showed me today. Since that time he has not noted as much tenderness he feels like something may have "popped out" Electronic Signature(s) Signed: 07/02/2018 11:53:57 PM By: Worthy Keeler PA-C Entered  By: Worthy Keeler on 07/02/2018 17:39:44 [name], [name] ([id number]) -------------------------------------------------------------------------------- Physical Exam Details Patient Name: Marcus Loll T. Date of Service: 07/02/2018 3:45 PM Medical Record Number: [id number] Patient Account Number: [id number] Date of Birth/Sex: 1951/06/24 (67 y.o. M) Treating RN: Ahmed Prima Primary Care Provider: BESS, KATY Other Clinician: Referring Provider: BESS, KATY Treating Provider/Extender: STONE III, Laurianne Floresca Weeks in Treatment: 10 Constitutional Well-nourished and well-hydrated in no acute distress. Respiratory normal breathing without difficulty. Cardiovascular trace pitting edema of the bilateral lower extremities. Psychiatric this patient is able to make decisions and demonstrates good insight into disease process. Alert and Oriented x 3. pleasant and cooperative. Notes On inspection today patient's wound again on the great toe show some signs of improvement there was some Slough I did sharply debride the wound in order to clean this away but overall he seems to be doing very well. In general I'm pleased with the progress that has been made. Electronic Signature(s) Signed: 07/02/2018 11:53:57 PM By: Worthy Keeler PA-C Entered By: Worthy Keeler on 07/02/2018 17:40:26 [name], [name] ([id number]) -------------------------------------------------------------------------------- Physician Orders Details Patient Name: [name]. Date of Service: 07/02/2018 3:45 PM Medical Record Number: [id number] Patient Account Number: [id number] Date of Birth/Sex: [birth year] (67 y.o. M) Treating RN: Ahmed Prima Primary Care Provider: BESS, KATY Other Clinician: Referring Provider: BESS, KATY Treating Provider/Extender: STONE III, Jenavee Laguardia Weeks in Treatment: 10 Verbal / Phone Orders: Yes Clinician: Carolyne Fiscal, Debi Read Back and Verified: Yes Diagnosis Coding ICD-10 Coding Code  Description I87.2 Venous insufficiency (chronic) (peripheral) K09.381W Laceration without foreign body, left lower leg, initial encounter L97.822 Non-pressure chronic ulcer of other part of left lower leg with fat layer exposed L97.521 Non-pressure chronic ulcer of other part of left foot limited to breakdown of skin E11.621 Type 2 diabetes mellitus with foot ulcer I63.9 Cerebral infarction, unspecified I10 Essential (primary) hypertension Wound Cleansing Wound #2 Left Toe Great o Cleanse wound with mild soap and water o May Shower, gently pat wound dry prior to applying new dressing. Anesthetic (add to Medication List) Wound #2 Left Toe Great o Topical Lidocaine 4% cream applied to wound bed prior to debridement (In Clinic Only). Primary Wound Dressing Wound #2 Left Toe Great o Silver Collagen - moisten lightly with saline Secondary Dressing Wound #2 Left Toe Great o Gauze and Kerlix/Conform - secure with coban o Foam Dressing Change Frequency Wound #2 Left Toe Great o Change dressing every other day. Follow-up Appointments Wound #2 Left Toe Great o Return Appointment in 1 week. o Nurse Visit as needed Off-Loading Wound #2 Left Toe Great o Other: - Keep as much pressure off of toe  as you can. [name], [name] ([id number]) Additional Orders / Instructions Wound #2 Left Toe Great o Stop Smoking o Vitamin A; Vitamin C, Zinc - Please add a multivitamin with 100% of these o Increase protein intake. o Activity as tolerated Patient Medications Allergies: bee venom protein (honey bee), penicillin, oyster shell Notifications Medication Indication Start End lidocaine DOSE 1 - topical 4 % cream - 1 cream topical Electronic Signature(s) Signed: 07/02/2018 5:13:20 PM By: Alric Quan Signed: 07/02/2018 11:53:57 PM By: Worthy Keeler PA-C Entered By: Alric Quan on 07/02/2018 16:37:37 [name], [name]  ([id number]) -------------------------------------------------------------------------------- Prescription 07/02/2018 Patient Name: Marcus Loll T. Provider: Worthy Keeler PA-C Date of Birth: 02-05-1951 NPI#: [id number] Sex: [name] DEA#: [id number] Phone #: [phone] License #: Patient Address: [place], [place] [address] [address] [place], [place] [address] [phone] Allergies bee venom protein (honey bee) penicillin oyster shell Medication Medication: Route: Strength: Form: lidocaine topical 4% cream Class: TOPICAL LOCAL ANESTHETICS Dose: Frequency / Time: Indication: 1 1 cream topical Number of Refills: Number of Units: 0 Generic Substitution: Start Date: End Date: Administered at [facility]: Yes Time Administered: Time Discontinued: Note to Pharmacy: Signature(s): Date(s): Electronic Signature(s) [name], [name] ([id number]) Signed: 07/02/2018 5:13:20 PM By: Alric Quan Signed: 07/02/2018 11:53:57 PM By: Worthy Keeler PA-C Entered By: Alric Quan on 07/02/2018 16:37:38 [name], [name] ([id number]) --------------------------------------------------------------------------------  Problem List Details Patient Name: Marcus Loll T. Date of Service: 07/02/2018 3:45 PM Medical Record Number: [id number] Patient Account Number: [id number] Date of Birth/Sex: 1951-01-30 (67 y.o. M) Treating RN: Ahmed Prima Primary Care Provider: BESS, KATY Other Clinician: Referring Provider: BESS, KATY Treating Provider/Extender: STONE III, Elisabeth Strom Weeks in Treatment: 10 Active Problems ICD-10 Evaluated Encounter Code Description Active Date Today Diagnosis I87.2 Venous insufficiency (chronic) (peripheral) 04/19/2018 No Yes S81.812A Laceration without foreign body, left lower leg, initial 04/19/2018 No Yes encounter L97.822  Non-pressure chronic ulcer of other part of left lower leg with 04/19/2018 No Yes fat layer exposed L97.521 Non-pressure chronic ulcer of other part of left foot limited to 04/19/2018 No Yes breakdown of skin E11.621 Type 2 diabetes mellitus with foot ulcer 04/19/2018 No Yes I63.9 Cerebral infarction, unspecified 04/19/2018 No Yes I10 Essential (primary) hypertension 04/19/2018 No Yes Inactive Problems Resolved Problems Electronic Signature(s) Signed: 07/02/2018 11:53:57 PM By: Worthy Keeler PA-C Entered By: Worthy Keeler on 07/02/2018 16:00:04 [name], [name] ([id number]) -------------------------------------------------------------------------------- Progress Note Details Patient Name: Marcus Loll T. Date of Service: 07/02/2018 3:45 PM Medical Record Number: [id number] Patient Account Number: [id number] Date of Birth/Sex: 1951/01/27 (67 y.o. M) Treating RN: Ahmed Prima Primary Care Provider: BESS, KATY Other Clinician: Referring Provider: BESS, KATY Treating Provider/Extender: STONE III, Keron Neenan Weeks in Treatment: 10 Subjective Chief Complaint Information obtained from Patient Left lower leg and left great toe ulcer History of Present Illness (HPI) The following HPI elements were documented for the patient's wound: Associated Signs and Symptoms: Patient has a history of chronic venous insufficiency, diabetes mellitus type II, its reform fraction, hypertension 04/19/18 on evaluation today patient presents initially concerning an ulcer of his left great toe and left anterior shin. He states that the shin was definitely a trauma injury where he struck this on a metal pole injuring leg. In regard to the distal left great toe he's really not sure what happened here although he does have a significant callous noted as well. He does have a pacemaker. Fortunately his  ABI was 0.98 on the right and 0.83 on the left this appears to be doing fairly well. Overall I'm pleased with that.  Nonetheless he states that this really has not seem to be improving. He has been tolerating the dressing changes without complication. The left anterior leg ulcer began on 04/05/18 according to what he tells [place] today although one note I did have for review said it was April 5. I'm inclined to believe it was the fifth she did have an office visit on 04/01/18 with his family nurse practitioner Faith Rogue. Nonetheless either way it's been going on this month for several weeks. The left great toe has actually been present since January 1 he does not know how this occurred. Fortunately has no pain at the site although he does have pain on the left anterior shin. His hemoglobin A1c is 8.0 in March 2019. 04/26/18 on evaluation today patient presents for follow-up concerning his life into your [place] also as well as his left great toe also. Fortunately the shin actually appears to be doing better on evaluation today in my opinion I'm not seeing as much in the way of fluffernutter on the surface although it does still require debridement today this is improved. Size wise it's really not much different. With that being said the total ulcer actually appears to be a little bit more macerated today I'm not sure why they state that several days ago when this was changed that was not the case. Fortunately is not having severe pain although both areas do hurt the shin is worse. 05/03/18 on evaluation today patient appears to be doing better in regard to his left first toe and left shin ulcers. He is been tolerating the dressing changes without complication and the good news is this seems to be showing signs of getting better. Overall I'm very pleased with the progress that has been made over the past week. Patient is still having pain especially in regard to the left shin. 05/10/18 on evaluation today patient appears to be doing rather well in regard to his two ulcers. The toe ulcer appears to be smaller he does  have some undermining however in this had to be cleaned out just a little bit. With that being said overall I feel like this is showing signs of improvement. The left anterior shin ulcer also showing signs of improvement at this time. There does not appear to be any evidence of infection which is good news. 05/21/18 on evaluation today patient appears to be doing very well in regard to his left shin ulcer. He has been tolerating the dressing changes without complication. With that being said he is having issues at this point with his great toe on the left. We have not x-rayed that at this point I think we may need to. Nonetheless he has pain really at the roughly 5 o'clock location but nowhere else and I really cannot explain this I do not see any obvious form body. Nonetheless he continues to have issues with getting this area to close it's not progressing as nicely. 05/28/18 on evaluation today patient appears to be doing very well in regard to his left lower extremity anterior ulcer. He has been tolerating the dressing changes without complication. Overall this is making excellent progress with the collagen dressing. His left great toe x-ray did return and showed that he had no if you that normality. He did have a plantar heel spur [name], Marcus T. ([id number]) but this is more  degenerative. Otherwise just arthritis was noted no osteomyelitis and no obvious form body was visualized. Overall things seem to be going fairly well. With that being said he still has pain when looking at the toe from the plantar aspect at roughly the six back to the 4 o'clock location which is not quite as bad as last time but still hurts more than any other part of his wound. It does appear the Iodoflex has been of benefit however. 06/04/18 on evaluation today patient appears to be doing excellent in regard to his left anterior shin ulcer. This has made excellent progress and is showing signs of improving in fact very  close to completely closing in healing. Nonetheless unfortunately his left great toe has not faired quite as well as far as progress is concerned. He notes that after last week's debridement he actually seem to be doing a little bit better for a couple of days until Thursday of last week when he had increases in discomfort as well is changes in the draining. He states that the drainage has been more green in color according to his wife and what he has seen. He's also had more discomfort. He relates to me again that he really feels like this all started when he stepped on a nail although he does not exactly remember it it seemed to be a puncture wound that he feels may be the culprit for why all this started. Nonetheless I am getting more concerned about the possibility of osteomyelitis despite the fact that the x-ray was negative I was hopeful in this interim since last week to this week that we would see some improvement in the overall appearance of the toe. Unfortunately if anything I feel like it's a little worse. It was macerated but I think we can attribute this to the fact that the patient did have an episode last night we got stuck in the rain and he thinks the dressing got wet. He did not change it I told him in the future if this were to happen to please go ahead and change the dressing it won't hurt to change it more frequently in that situation. Otherwise other than the maceration I do not see any evidence of anything being any worse but it also does not appear to be any better. 06/11/18 on evaluation today patient's wounds both the great toe as well as the left shin both on the left side show signs of good improvement. Fortunately the patient's culture came back negative in regard to the toe and there does not appear to be any evidence of infection. His MRI is actually scheduled for 06/19/18. Fortunately this is right before I see him next week and then we can go to the results of the  culture at that point. Patient is in agreement with that plan. With that being said otherwise he has been showing signs of having less pain in regard to the toe which is also good news my hope is that he does not have osteomyelitis. 06/18/18 on evaluation today patient appears to be doing very well in regard to his left lower extremity ulcer. He also states at this point in time that he has been tolerating the dressing changes with the toe it appears also to be doing very well at this point. In general I'm happy with the progress that he seems to be making. With that being said he still continues to have pain in regard to the great toe ulcer fortunately this does  not seem to be as significant as it was in the beginning but still nonetheless he is having some pain. No fevers, chills, nausea, or vomiting noted at this time. 06/25/18 on evaluation today patient actually appears to be doing very well in regard to his left anterior shin ulcer. He's been tolerating the dressing changes without complication in this seems to have done extremely well. In fact this appears to be healed. With that being said his toe ulcer still continues to get in trouble fortunately not as much as has been in the past and there have been some signs of improvement and better granulation although I still think he's getting a lot of callous to the area which seems to indicate a lot of pressure and friction. I believe he may benefit from a total contact cast especially now that the left shin ulcer has healed. 07/02/18 on evaluation today patient appears to be doing rather well in regard to his left anterior shin ulcer which is actually completely healed at this point. With that being said he has left great toe ulcer actually appears to be doing better he states over the weekend he actually had a significant amount of drainage from the toe after it had been hurting for some time. He states when his wife change the dressing there was a  lot of discharge and fluid he actually took a picture he showed me today. Since that time he has not noted as much tenderness he feels like something may have "popped out" Patient History Information obtained from Patient. Family History Cancer - Father, Diabetes - Father, Heart Disease - Mother,Father, Hypertension - Mother, Kidney Disease - Child, No family history of Lung Disease, Seizures, Stroke, Thyroid Problems, Tuberculosis. Social History Current every day smoker - 1 - 1/2 pack daily, Marital Status - Married, Alcohol Use - Rarely, Drug Use - No History, Caffeine Use - Daily - coffee. Review of Systems (ROS) Constitutional Symptoms (General Health) Rosser, BERNAL LUHMAN. ([id number]) Denies complaints or symptoms of Fever, Chills. Respiratory The patient has no complaints or symptoms. Cardiovascular The patient has no complaints or symptoms. Psychiatric The patient has no complaints or symptoms. Objective Constitutional Well-nourished and well-hydrated in no acute distress. Vitals Time Taken: 4:05 PM, Height: 69 in, Weight: 238 lbs, BMI: 35.1, Temperature: 98.2 F, Pulse: 77 bpm, Respiratory Rate: 16 breaths/min, Blood Pressure: 118/62 mmHg. Respiratory normal breathing without difficulty. Cardiovascular trace pitting edema of the bilateral lower extremities. Psychiatric this patient is able to make decisions and demonstrates good insight into disease process. Alert and Oriented x 3. pleasant and cooperative. General Notes: On inspection today patient's wound again on the great toe show some signs of improvement there was some Slough I did sharply debride the wound in order to clean this away but overall he seems to be doing very well. In general I'm pleased with the progress that has been made. Integumentary (Hair, Skin) Wound #2 status is Open. Original cause of wound was Trauma. The wound is located on the Left Toe Great. The wound measures 0.2cm length x 0.2cm width x  0.3cm depth; 0.031cm^2 area and 0.009cm^3 volume. There is no undermining noted, however, there is tunneling at 12:00 with a maximum distance of 0.3cm. There is a small amount of serosanguineous drainage noted. The wound margin is flat and intact. There is no granulation within the wound bed. There is a medium (34- 66%) amount of necrotic tissue within the wound bed including Adherent Slough. The periwound skin appearance exhibited: Callus.  The periwound skin appearance did not exhibit: Crepitus, Excoriation, Induration, Rash, Scarring, Dry/Scaly, Maceration, Atrophie Blanche, Cyanosis, Ecchymosis, Hemosiderin Staining, Mottled, Pallor, Rubor, Erythema. Periwound temperature was noted as No Abnormality. The periwound has tenderness on palpation. 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 There was a Excisional Skin/Subcutaneous Tissue Debridement with a total area of 0.04 sq cm performed by STONE III, Faelynn Wynder E., PA-C. With the following instrument(s): Curette to remove Viable and Non-Viable tissue/material. Material removed includes Callus and Subcutaneous Tissue and after achieving pain control using Lidocaine 4% Topical Solution. No specimens were taken. A time out was conducted at 16:32, prior to the start of the procedure. A Minimum amount of bleeding was controlled with Pressure. The procedure  was tolerated well with a pain level of 0 throughout and a pain level of 0 following the procedure. Patient s Level of Consciousness post procedure was recorded as Awake and Alert. Post Debridement Measurements: 0.3cm length x 0.3cm width x 0.3cm depth; 0.021cm^3 volume. Character of Wound/Ulcer Post Debridement requires further debridement. Severity of Tissue Post Debridement is: Fat layer exposed. Post procedure Diagnosis Wound #2: Same as Pre-Procedure Plan Wound Cleansing: Wound #2 Left Toe Great: Cleanse wound with mild soap and water May Shower, gently pat wound dry prior to applying new dressing. Anesthetic (add to Medication List): Wound #2 Left Toe Great: Topical Lidocaine 4% cream applied to wound bed prior to debridement (In Clinic Only). Primary Wound Dressing: Wound #2 Left Toe Great: Silver Collagen - moisten lightly with saline Secondary Dressing: Wound #2 Left Toe Great: Gauze and Kerlix/Conform - secure with coban Foam Dressing Change Frequency: Wound #2 Left Toe Great: Change dressing every other day. Follow-up Appointments: Wound #2 Left Toe Great: Return Appointment in 1 week. Nurse Visit as needed Off-Loading: Ent, [name] ([id number]) Wound #2 Left Toe Great: Other: - Keep as much pressure off of toe as you can. Additional Orders / Instructions: Wound #2 Left Toe Great: Stop Smoking Vitamin A; Vitamin C, Zinc - Please add a multivitamin with 100% of these Increase protein intake. Activity as tolerated The following medication(s) was prescribed: lidocaine topical 4 % cream 1 1 cream topical was prescribed at facility At this time we have suggested and discussed the possibility of a total contact cast. With that being said being that his wound seems to be doing somewhat better he would like to get this one more week and I agree with the increase in drainage I do not really want to put a another layer or possibly a cast over this in order for it to  become more macerated and bothersome for him. For that reason we're gonna see you how things do over the next week with the continuation of the Current wound care measures. Please see above for specific wound care orders. We will see patient for re-evaluation in 1 week(s) here in the clinic. If anything worsens or changes patient will contact our office for additional recommendations. Electronic Signature(s) Signed: 07/02/2018 11:53:57 PM By: Joaquim Lai  III, Jolane Bankhead PA-C Entered By: Worthy Keeler on 07/02/2018 17:41:08 [name], [name] ([id number]) -------------------------------------------------------------------------------- ROS/PFSH Details Patient Name: Marcus Loll T. Date of Service: 07/02/2018 3:45 PM Medical Record Number: [id number] Patient Account Number: [id number] Date of Birth/Sex: [birth year] (67 y.o. M) Treating RN: Ahmed Prima Primary Care Provider: BESS, KATY Other Clinician: Referring Provider: BESS, KATY Treating Provider/Extender: STONE III, Analeah Brame Weeks in Treatment: 10 Information Obtained From Patient Wound History Do you currently have one or more open woundso Yes How many open wounds do you currently haveo 2 Approximately how long have you had your woundso 2 weeks How have you been treating your wound(s) until nowo yes Doctor Has your wound(s) ever healed and then re-openedo No Have you had any lab work done in the past montho No Have you tested positive for an antibiotic resistant organism (MRSA, VRE)o No Have you tested positive for osteomyelitis (bone infection)o No Have you had any tests for circulation on your legso No Have you had other problems associated with your woundso Infection, Swelling Constitutional Symptoms (General Health) Complaints and Symptoms: Negative for: Fever; Chills Eyes Medical History: Negative for: Cataracts; Glaucoma; Optic Neuritis Ear/Nose/Mouth/Throat Medical History: Negative for: Chronic sinus  problems/congestion Hematologic/Lymphatic Medical History: Positive for: Lymphedema Negative for: Anemia; Hemophilia; Human Immunodeficiency Virus; Sickle Cell Disease Respiratory Complaints and Symptoms: No Complaints or Symptoms Medical History: Negative for: Aspiration; Asthma; Chronic Obstructive Pulmonary Disease (COPD); Pneumothorax; Sleep Apnea; Tuberculosis Cardiovascular Complaints and Symptoms: No Complaints or Symptoms Medical History: DEE, MADAY ([id number]) Positive for: Arrhythmia - pacemaker; Coronary Artery Disease; Hypertension; Myocardial Infarction Negative for: Deep Vein Thrombosis; Hypotension; Peripheral Arterial Disease; Peripheral Venous Disease; Phlebitis; Vasculitis Gastrointestinal Medical History: Negative for: Cirrhosis ; Colitis; Crohnos; Hepatitis A; Hepatitis B; Hepatitis C Endocrine Medical History: Positive for: Type II Diabetes Negative for: Type I Diabetes Time with diabetes: 5 years Treated with: Oral agents Blood sugar tested every day: Yes Tested : daily AM Blood sugar testing results: Breakfast: 174 Genitourinary Medical History: Negative for: End Stage Renal Disease Immunological Medical History: Negative for: Lupus Erythematosus; Raynaudos; Scleroderma Integumentary (Skin) Medical History: Negative for: History of Burn; History of pressure wounds Musculoskeletal Medical History: Negative for: Gout; Rheumatoid Arthritis; Osteoarthritis; Osteomyelitis Neurologic Medical History: Positive for: Neuropathy Negative for: Dementia; Quadriplegia; Paraplegia; Seizure Disorder Oncologic Medical History: Positive for: Received Chemotherapy; Received Radiation - Seeds prostate Psychiatric Complaints and Symptoms: No Complaints or Symptoms Medical History: Negative for: Anorexia/bulimia; Confinement Anxiety [name], JAHMERE BRAMEL ([id number]) Immunizations Pneumococcal Vaccine: Received Pneumococcal Vaccination: Yes Tetanus  Vaccine: Last tetanus shot: 12/25/2017 Implantable Devices Family and Social History Cancer: Yes - Father; Diabetes: Yes - Father; Heart Disease: Yes - Mother,Father; Hypertension: Yes - Mother; Kidney Disease: Yes - Child; Lung Disease: No; Seizures: No; Stroke: No; Thyroid Problems: No; Tuberculosis: No; Current every day smoker - 1 - 1/2 pack daily; Marital Status - Married; Alcohol Use: Rarely; Drug Use: No History; Caffeine Use: Daily - coffee; Financial Concerns: No; Food, Clothing or Shelter Needs: No; Support System Lacking: No; Transportation Concerns: No Physician Affirmation I have reviewed and agree with the above information. Electronic Signature(s) Signed: 07/02/2018 11:53:57 PM By: Worthy Keeler PA-C Signed: 07/03/2018 5:25:23 PM By: Alric Quan Entered By: Worthy Keeler on 07/02/2018 17:40:02 Droege, [name] ([id number]) -------------------------------------------------------------------------------- SuperBill Details Patient Name: [name]. Date of Service: 07/02/2018 Medical Record Number: [id number] Patient Account Number: [id number] Date of Birth/Sex: Mar 22, 1951 (67 y.o. M) Treating RN: Ahmed Prima Primary Care Provider: Lillard Anes Other Clinician: Referring Provider:  BESS, KATY Treating Provider/Extender: STONE III, Vaun Hyndman Weeks in Treatment: 10 Diagnosis Coding ICD-10 Codes Code Description I87.2 Venous insufficiency (chronic) (peripheral) W80.321Y Laceration without foreign body, left lower leg, initial encounter [phone] Non-pressure chronic ulcer of other part of left lower leg with fat layer exposed L97.521 Non-pressure chronic ulcer of other part of left foot limited to breakdown of skin E11.621 Type 2 diabetes mellitus with foot ulcer I63.9 Cerebral infarction, unspecified I10 Essential (primary) hypertension Facility Procedures CPT4 Code Description: [id number] 11042 - DEB SUBQ TISSUE 20 SQ CM/< ICD-10 Diagnosis Description L97.521  Non-pressure chronic ulcer of other part of left foot limited t Modifier: o breakdown of s Quantity: 1 kin Physician Procedures CPT4 Code Description: [id number] 11042 - WC PHYS SUBQ TISS 20 SQ CM ICD-10 Diagnosis Description L97.521 Non-pressure chronic ulcer of other part of left foot limited t Modifier: o breakdown of sk Quantity: 1 in Electronic Signature(s) Signed: 07/02/2018 11:53:57 PM By: Worthy Keeler PA-C Entered By: Worthy Keeler on 07/02/2018 17:41:25

## 2018-07-07 ENCOUNTER — Other Ambulatory Visit: Payer: PPO

## 2018-07-09 ENCOUNTER — Encounter: Payer: PPO | Admitting: Physician Assistant

## 2018-07-09 DIAGNOSIS — L97522 Non-pressure chronic ulcer of other part of left foot with fat layer exposed: Secondary | ICD-10-CM | POA: Diagnosis not present

## 2018-07-09 DIAGNOSIS — E11621 Type 2 diabetes mellitus with foot ulcer: Secondary | ICD-10-CM | POA: Diagnosis not present

## 2018-07-09 DIAGNOSIS — L97822 Non-pressure chronic ulcer of other part of left lower leg with fat layer exposed: Secondary | ICD-10-CM | POA: Diagnosis not present

## 2018-07-11 NOTE — Progress Notes (Signed)
[name] ([id number]) Visit Report for 07/09/2018 Arrival Information Details Patient Name: [name], [name]. Date of Service: 07/09/2018 8:45 AM Medical Record Number: [id number] Patient Account Number: [id number] Date of Birth/Sex: 03-07-1951 (67 y.o. M) Treating RN: Roger Shelter Primary Care Lizbet Cirrincione: BESS, KATY Other Clinician: Referring Dustie Brittle: BESS, KATY Treating Versie Fleener/Extender: STONE III, HOYT Weeks in Treatment: 11 Visit Information History Since Last Visit All ordered tests and consults were completed: No Patient Arrived: Ambulatory Added or deleted any medications: No Arrival Time: 08:49 Any new allergies or adverse reactions: No Accompanied By: self Had a fall or experienced change in No Transfer Assistance: None activities of daily living that may affect Patient Identification Verified: Yes risk of falls: Secondary Verification Process Completed: Yes Signs or symptoms of abuse/neglect since last visito No Patient Has Alerts: Yes Hospitalized since last visit: No Patient Alerts: Type II Diabetic Implantable device outside of the clinic excluding No cellular tissue based products placed in the center since last visit: Pain Present Now: No Electronic Signature(s) Signed: 07/09/2018 4:58:15 PM By: Roger Shelter Entered By: Roger Shelter on 07/09/2018 08:50:09 Noyes, Huntley Dec ([id number]) -------------------------------------------------------------------------------- Lower Extremity Assessment Details Patient Name: Marcus Loll T. Date of Service: 07/09/2018 8:45 AM Medical Record Number: [id number] Patient Account Number: [id number] Date of Birth/Sex: Jan 22, 1951 (67 y.o. M) Treating RN: Roger Shelter Primary Care Malic Rosten: BESS, KATY Other Clinician: Referring Navarre Diana: BESS, KATY Treating Roxine Whittinghill/Extender: STONE III, HOYT Weeks in Treatment: 11 Edema Assessment Assessed: [Left: No] [Right: No] [Left: Edema] [Right: :] Calf Left:  Right: Point of Measurement: 36 cm From Medial Instep 38 cm cm Ankle Left: Right: Point of Measurement: 11 cm From Medial Instep 26 cm cm Vascular Assessment Claudication: Claudication Assessment [Left:None] Pulses: Dorsalis Pedis Palpable: [Left:Yes] Posterior Tibial Extremity colors, hair growth, and conditions: Extremity Color: [Left:Normal] Hair Growth on Extremity: [Left:Yes] Temperature of Extremity: [Left:Warm] Capillary Refill: [Left:< 3 seconds] Toe Nail Assessment Left: Right: Thick: Yes Discolored: Yes Deformed: Yes Improper Length and Hygiene: Yes Electronic Signature(s) Signed: 07/09/2018 4:58:15 PM By: Roger Shelter Entered By: Roger Shelter on 07/09/2018 08:57:38 Viera, Huntley Dec ([id number]) -------------------------------------------------------------------------------- Multi Wound Chart Details Patient Name: Marcus Loll T. Date of Service: 07/09/2018 8:45 AM Medical Record Number: [id number] Patient Account Number: [id number] Date of Birth/Sex: [birth year] (67 y.o. M) Treating RN: Ahmed Prima Primary Care Marcia Lepera: BESS, KATY Other Clinician: Referring Isobel Eisenhuth: BESS, KATY Treating Kendle Turbin/Extender: STONE III, HOYT Weeks in Treatment: 11 Vital Signs Height(in): 18 Pulse(bpm): 72 Weight(lbs): 238 Blood Pressure(mmHg): 117/65 Body Mass Index(BMI): 35 Temperature(F): 97.5 Respiratory Rate 16 (breaths/min): Photos: [2:No Photos] [N/A:N/A] Wound Location: [2:Left Toe Great] [N/A:N/A] Wounding Event: [2:Trauma] [N/A:N/A] Primary Etiology: [2:Diabetic Wound/Ulcer of the Lower Extremity] [N/A:N/A] Secondary Etiology: [2:Trauma, Other] [N/A:N/A] Comorbid History: [2:Lymphedema, Arrhythmia, Coronary Artery Disease, Hypertension, Myocardial Infarction, Type II Diabetes, Neuropathy, Received Chemotherapy, Received Radiation] [N/A:N/A] Date Acquired: [2:12/25/2017] [N/A:N/A] Weeks of Treatment: [2:11] [N/A:N/A] Wound Status: [2:Open]  [N/A:N/A] Pending Amputation on [2:Yes] [N/A:N/A] Presentation: Measurements L x W x D [2:0.5x0.5x0.3] [N/A:N/A] (cm) Area (cm) : [2:0.196] [N/A:N/A] Volume (cm) : [2:0.059] [N/A:N/A] % Reduction in Area: [2:-317.00%] [N/A:N/A] % Reduction in Volume: [2:-1080.00%] [N/A:N/A] Starting Position 1 [2:12] (o'clock): Ending Position 1 [2:12] (o'clock): Maximum Distance 1 (cm): [2:0.3] Undermining: [2:Yes] [N/A:N/A] Classification: [2:Grade 1] [N/A:N/A] Exudate Amount: [2:Small] [N/A:N/A] Exudate Type: [2:Serosanguineous] [N/A:N/A] Exudate Color: [2:red, brown] [N/A:N/A] Wound Margin: [2:Flat and Intact] [N/A:N/A] Granulation Amount: [2:None Present (0%)] [N/A:N/A] Necrotic Amount: [2:Large (67-100%)] [N/A:N/A] Exposed Structures: Fat Layer (Subcutaneous N/A N/A Tissue) Exposed: Yes Fascia: No Tendon: No  Muscle: No Joint: No Bone: No Epithelialization: Small (1-33%) N/A N/A Periwound Skin Texture: Excoriation: No N/A N/A Induration: No Callus: No Crepitus: No Rash: No Scarring: No Periwound Skin Moisture: Maceration: No N/A N/A Dry/Scaly: No Periwound Skin Color: Atrophie Blanche: No N/A N/A Cyanosis: No Ecchymosis: No Erythema: No Hemosiderin Staining: No Mottled: No Pallor: No Rubor: No Temperature: No Abnormality N/A N/A Tenderness on Palpation: Yes N/A N/A Wound Preparation: Ulcer Cleansing: N/A N/A Rinsed/Irrigated with Saline Topical Anesthetic Applied: Other: lidocaine 4% Treatment Notes Electronic Signature(s) Signed: 07/10/2018 5:07:56 PM By: Alric Quan Entered By: Alric Quan on 07/09/2018 09:19:52 Kabat, Huntley Dec ([id number]) -------------------------------------------------------------------------------- [place] Details Patient Name: [name]. Date of Service: 07/09/2018 8:45 AM Medical Record Number: [id number] Patient Account Number: [id number] Date of Birth/Sex: 1951-03-22 (67 y.o. M) Treating RN:  Ahmed Prima Primary Care Jameka Ivie: BESS, KATY Other Clinician: Referring Magaret Justo: BESS, KATY Treating Thoren Hosang/Extender: STONE III, HOYT Weeks in Treatment: 11 Active Inactive ` Abuse / Safety / Falls / Self Care Management Nursing Diagnoses: Potential for falls Goals: Patient will remain injury free related to falls Date Initiated: 04/19/2018 Target Resolution Date: 05/04/2018 Goal Status: Active Interventions: Assess fall risk on admission and as needed Notes: ` Nutrition Nursing Diagnoses: Impaired glucose control: actual or potential Goals: Patient/caregiver agrees to and verbalizes understanding of need to use nutritional supplements and/or vitamins as prescribed Date Initiated: 04/19/2018 Target Resolution Date: 06/07/2018 Goal Status: Active Patient/caregiver verbalizes understanding of need to maintain therapeutic glucose control per primary care physician Date Initiated: 04/19/2018 Target Resolution Date: 05/31/2018 Goal Status: Active Interventions: Assess HgA1c results as ordered upon admission and as needed Assess patient nutrition upon admission and as needed per policy Notes: ` Orientation to the Wound Care Program Nursing Diagnoses: Knowledge deficit related to the wound healing center program Goals: Patient/caregiver will verbalize understanding of the [place] OMAURI, BOEVE ([id number]) Date Initiated: 04/19/2018 Target Resolution Date: 06/29/2018 Goal Status: Active Interventions: Provide education on orientation to the wound center Notes: ` Wound/Skin Impairment Nursing Diagnoses: Impaired tissue integrity Goals: Ulcer/skin breakdown will heal within 14 weeks Date Initiated: 04/19/2018 Target Resolution Date: 06/29/2018 Goal Status: Active Interventions: Assess patient/caregiver ability to obtain necessary supplies Assess patient/caregiver ability to perform ulcer/skin care regimen upon admission and as needed Assess  ulceration(s) every visit Notes: Electronic Signature(s) Signed: 07/10/2018 5:07:56 PM By: Alric Quan Entered By: Alric Quan on 07/09/2018 09:19:45 Ehly, Huntley Dec ([id number]) -------------------------------------------------------------------------------- Pain Assessment Details Patient Name: Marcus Loll T. Date of Service: 07/09/2018 8:45 AM Medical Record Number: [id number] Patient Account Number: [id number] Date of Birth/Sex: Jun 07, 1951 (67 y.o. M) Treating RN: Roger Shelter Primary Care Sanjuanita Condrey: BESS, Valetta Fuller Other Clinician: Referring Hilma Steinhilber: BESS, KATY Treating Wylene Weissman/Extender: STONE III, HOYT Weeks in Treatment: 11 Active Problems Location of Pain Severity and Description of Pain Patient Has Paino No Site Locations Pain Management and Medication Current Pain Management: Notes sharp shooting pains at times Electronic Signature(s) Signed: 07/09/2018 4:58:15 PM By: Roger Shelter Entered By: Roger Shelter on 07/09/2018 08:50:35 Begay, Huntley Dec ([id number]) -------------------------------------------------------------------------------- Wound Assessment Details Patient Name: Marcus Loll T. Date of Service: 07/09/2018 8:45 AM Medical Record Number: [id number] Patient Account Number: [id number] Date of Birth/Sex: [birth year] (67 y.o. M) Treating RN: Roger Shelter Primary Care Cynitha Berte: BESS, KATY Other Clinician: Referring Emrys Mceachron: BESS, KATY Treating Scout Guyett/Extender: STONE III, HOYT Weeks in Treatment: 11 Wound Status Wound Number: 2 Primary Diabetic Wound/Ulcer of the Lower Extremity Etiology: Wound Location: Left Toe Great Secondary Trauma, Other Wounding Event: Trauma  Etiology: Date Acquired: 12/25/2017 Wound Open Weeks Of Treatment: 11 Status: Clustered Wound: No Comorbid Lymphedema, Arrhythmia, Coronary Artery Pending Amputation On Presentation History: Disease, Hypertension, Myocardial Infarction, Type II Diabetes, Neuropathy,  Received Chemotherapy, Received Radiation Wound Measurements Length: (cm) 0.5 % Reducti Width: (cm) 0.5 % Reducti Depth: (cm) 0.3 Epithelia Area: (cm) 0.196 Tunnelin Volume: (cm) 0.059 Undermin Starti Ending Maximu on in Area: -317% on in Volume: -1080% lization: Small (1-33%) g: No ing: Yes ng Position (o'clock): 12 Position (o'clock): 12 m Distance: (cm) 0.3 Wound Description Classification: Grade 1 Foul Odo Wound Margin: Flat and Intact Slough/F Exudate Amount: Small Exudate Type: Serosanguineous Exudate Color: red, brown r After Cleansing: No ibrino Yes Wound Bed Granulation Amount: None Present (0%) Exposed Structure Necrotic Amount: Large (67-100%) Fascia Exposed: No Necrotic Quality: Adherent Slough Fat Layer (Subcutaneous Tissue) Exposed: Yes Tendon Exposed: No Muscle Exposed: No Joint Exposed: No Bone Exposed: No Periwound Skin Texture Texture Color No Abnormalities Noted: No No Abnormalities Noted: No Callus: No Atrophie Blanche: No Crepitus: No Cyanosis: No Excoriation: No Ecchymosis: No Induration: No Erythema: No Mimnaugh, MACY LINGENFELTER. ([id number]) Rash: No Hemosiderin Staining: No Scarring: No Mottled: No Pallor: No Moisture Rubor: No No Abnormalities Noted: No Dry / Scaly: No Temperature / Pain Maceration: No Temperature: No Abnormality Tenderness on Palpation: Yes Wound Preparation Ulcer Cleansing: Rinsed/Irrigated with Saline Topical Anesthetic Applied: Other: lidocaine 4%, Electronic Signature(s) Signed: 07/09/2018 4:58:15 PM By: Roger Shelter Entered By: Roger Shelter on 07/09/2018 08:56:16 Hufstedler, Huntley Dec ([id number]) -------------------------------------------------------------------------------- Vitals Details Patient Name: [name]. Date of Service: 07/09/2018 8:45 AM Medical Record Number: [id number] Patient Account Number: [id number] Date of Birth/Sex: 12-09-1951 (67 y.o. M) Treating RN: Roger Shelter Primary Care Gloris Shiroma: BESS, KATY Other Clinician: Referring Newel Oien: BESS, KATY Treating Derrel Moore/Extender: STONE III, HOYT Weeks in Treatment: 11 Vital Signs Time Taken: 08:51 Temperature (F): 97.5 Height (in): 69 Pulse (bpm): 72 Weight (lbs): 238 Respiratory Rate (breaths/min): 16 Body Mass Index (BMI): 35.1 Blood Pressure (mmHg): 117/65 Reference Range: 80 - 120 mg / dl Electronic Signature(s) Signed: 07/09/2018 4:58:15 PM By: Roger Shelter Entered By: Roger Shelter on 07/09/2018 08:51:08

## 2018-07-11 NOTE — Progress Notes (Signed)
TIRAS, BIANCHINI ([id number]) Visit Report for 07/09/2018 Chief Complaint Document Details Patient Name: Marcus, [name]. Date of Service: 07/09/2018 8:45 AM Medical Record Number: [id number] Patient Account Number: [id number] Date of Birth/Sex: [birth year] (67 y.o. M) Treating RN: Ahmed Prima Primary Care Provider: BESS, KATY Other Clinician: Referring Provider: BESS, KATY Treating Provider/Extender: STONE III, Ansley Stanwood Weeks in Treatment: 11 Information Obtained from: Patient Chief Complaint Left lower leg and left great toe ulcer Electronic Signature(s) Signed: 07/10/2018 1:39:21 PM By: Worthy Keeler PA-C Entered By: Worthy Keeler on 07/09/2018 09:07:54 Ponds, [name] ([id number]) -------------------------------------------------------------------------------- Debridement Details Patient Name: [name]. Date of Service: 07/09/2018 8:45 AM Medical Record Number: [id number] Patient Account Number: [id number] Date of Birth/Sex: 03/20/1951 (67 y.o. M) Treating RN: Ahmed Prima Primary Care Provider: BESS, KATY Other Clinician: Referring Provider: BESS, KATY Treating Provider/Extender: STONE III, Laurieann Friddle Weeks in Treatment: 11 Debridement Performed for Wound #2 Left Toe Great Assessment: Performed By: Physician STONE III, Shereece Wellborn E., PA-C Debridement Type: Debridement Severity of Tissue Pre Fat layer exposed Debridement: Pre-procedure Verification/Time Yes - 09:25 Out Taken: Start Time: 09:25 Pain Control: Lidocaine 4% Topical Solution Total Area Debrided (L x W): 0.5 (cm) x 0.5 (cm) = 0.25 (cm) Tissue and other material Viable, Non-Viable, Callus, Slough, Subcutaneous, Fibrin/Exudate, Slough debrided: Level: Skin/Subcutaneous Tissue Debridement Description: Excisional Instrument: Curette Bleeding: Moderate Hemostasis Achieved: Silver Nitrate End Time: 09:29 Procedural Pain: 0 Post Procedural Pain: 0 Response to Treatment: Procedure was tolerated well Level  of Consciousness: Awake and Alert Post Debridement Measurements of Total Wound Length: (cm) 0.6 Width: (cm) 0.6 Depth: (cm) 0.3 Volume: (cm) 0.085 Character of Wound/Ulcer Post Debridement: Requires Further Debridement Severity of Tissue Post Debridement: Fat layer exposed Post Procedure Diagnosis Same as Pre-procedure Electronic Signature(s) Signed: 07/10/2018 1:39:21 PM By: Worthy Keeler PA-C Signed: 07/10/2018 5:07:56 PM By: Alric Quan Entered By: Alric Quan on 07/09/2018 09:31:14 [name], [name] ([id number]) -------------------------------------------------------------------------------- HPI Details Patient Name: Marcus Loll T. Date of Service: 07/09/2018 8:45 AM Medical Record Number: [id number] Patient Account Number: [id number] Date of Birth/Sex: 1951/04/10 (67 y.o. M) Treating RN: Ahmed Prima Primary Care Provider: BESS, KATY Other Clinician: Referring Provider: BESS, KATY Treating Provider/Extender: STONE III, Tamberly Pomplun Weeks in Treatment: 11 History of Present Illness Associated Signs and Symptoms: Patient has a history of chronic venous insufficiency, diabetes mellitus type II, its reform fraction, hypertension HPI Description: 04/19/18 on evaluation today patient presents initially concerning an ulcer of his left great toe and left anterior shin. He states that the shin was definitely a trauma injury where he struck this on a metal pole injuring leg. In regard to the distal left great toe he's really not sure what happened here although he does have a significant callous noted as well. He does have a pacemaker. Fortunately his ABI was 0.98 on the right and 0.83 on the left this appears to be doing fairly well. Overall I'm pleased with that. Nonetheless he states that this really has not seem to be improving. He has been tolerating the dressing changes without complication. The left anterior leg ulcer began on 04/05/18 according to what he tells [place]  today although one note I did have for review said it was April 5. I'm inclined to believe it was the fifth she did have an office visit on 04/01/18 with his family nurse practitioner Faith Rogue. Nonetheless either way it's been going on this month for several weeks. The left great toe has actually been present since January 1 he does  not know how this occurred. Fortunately has no pain at the site although he does have pain on the left anterior shin. His hemoglobin A1c is 8.0 in March 2019. 04/26/18 on evaluation today patient presents for follow-up concerning his life into your [place] also as well as his left great toe also. Fortunately the shin actually appears to be doing better on evaluation today in my opinion I'm not seeing as much in the way of fluffernutter on the surface although it does still require debridement today this is improved. Size wise it's really not much different. With that being said the total ulcer actually appears to be a little bit more macerated today I'm not sure why they state that several days ago when this was changed that was not the case. Fortunately is not having severe pain although both areas do hurt the shin is worse. 05/03/18 on evaluation today patient appears to be doing better in regard to his left first toe and left shin ulcers. He is been tolerating the dressing changes without complication and the good news is this seems to be showing signs of getting better. Overall I'm very pleased with the progress that has been made over the past week. Patient is still having pain especially in regard to the left shin. 05/10/18 on evaluation today patient appears to be doing rather well in regard to his two ulcers. The toe ulcer appears to be smaller he does have some undermining however in this had to be cleaned out just a little bit. With that being said overall I feel like this is showing signs of improvement. The left anterior shin ulcer also showing signs of  improvement at this time. There does not appear to be any evidence of infection which is good news. 05/21/18 on evaluation today patient appears to be doing very well in regard to his left shin ulcer. He has been tolerating the dressing changes without complication. With that being said he is having issues at this point with his great toe on the left. We have not x-rayed that at this point I think we may need to. Nonetheless he has pain really at the roughly 5 o'clock location but nowhere else and I really cannot explain this I do not see any obvious form body. Nonetheless he continues to have issues with getting this area to close it's not progressing as nicely. 05/28/18 on evaluation today patient appears to be doing very well in regard to his left lower extremity anterior ulcer. He has been tolerating the dressing changes without complication. Overall this is making excellent progress with the collagen dressing. His left great toe x-ray did return and showed that he had no if you that normality. He did have a plantar heel spur but this is more degenerative. Otherwise just arthritis was noted no osteomyelitis and no obvious form body was visualized. Overall things seem to be going fairly well. With that being said he still has pain when looking at the toe from the plantar aspect at roughly the six back to the 4 o'clock location which is not quite as bad as last time but still hurts more than any other part of his wound. It does appear the Iodoflex has been of benefit however. 06/04/18 on evaluation today patient appears to be doing excellent in regard to his left anterior shin ulcer. This has made excellent progress and is showing signs of improving in fact very close to completely closing in healing. Nonetheless unfortunately his left great toe has  not faired quite as well as far as progress is concerned. He notes that after last week's Lopiccolo, TARVARES LANT. ([id number]) debridement he actually seem to  be doing a little bit better for a couple of days until Thursday of last week when he had increases in discomfort as well is changes in the draining. He states that the drainage has been more green in color according to his wife and what he has seen. He's also had more discomfort. He relates to me again that he really feels like this all started when he stepped on a nail although he does not exactly remember it it seemed to be a puncture wound that he feels may be the culprit for why all this started. Nonetheless I am getting more concerned about the possibility of osteomyelitis despite the fact that the x-ray was negative I was hopeful in this interim since last week to this week that we would see some improvement in the overall appearance of the toe. Unfortunately if anything I feel like it's a little worse. It was macerated but I think we can attribute this to the fact that the patient did have an episode last night we got stuck in the rain and he thinks the dressing got wet. He did not change it I told him in the future if this were to happen to please go ahead and change the dressing it won't hurt to change it more frequently in that situation. Otherwise other than the maceration I do not see any evidence of anything being any worse but it also does not appear to be any better. 06/11/18 on evaluation today patient's wounds both the great toe as well as the left shin both on the left side show signs of good improvement. Fortunately the patient's culture came back negative in regard to the toe and there does not appear to be any evidence of infection. His MRI is actually scheduled for 06/19/18. Fortunately this is right before I see him next week and then we can go to the results of the culture at that point. Patient is in agreement with that plan. With that being said otherwise he has been showing signs of having less pain in regard to the toe which is also good news my hope is that he does not  have osteomyelitis. 06/18/18 on evaluation today patient appears to be doing very well in regard to his left lower extremity ulcer. He also states at this point in time that he has been tolerating the dressing changes with the toe it appears also to be doing very well at this point. In general I'm happy with the progress that he seems to be making. With that being said he still continues to have pain in regard to the great toe ulcer fortunately this does not seem to be as significant as it was in the beginning but still nonetheless he is having some pain. No fevers, chills, nausea, or vomiting noted at this time. 06/25/18 on evaluation today patient actually appears to be doing very well in regard to his left anterior shin ulcer. He's been tolerating the dressing changes without complication in this seems to have done extremely well. In fact this appears to be healed. With that being said his toe ulcer still continues to get in trouble fortunately not as much as has been in the past and there have been some signs of improvement and better granulation although I still think he's getting a lot of callous to the area which  seems to indicate a lot of pressure and friction. I believe he may benefit from a total contact cast especially now that the left shin ulcer has healed. 07/02/18 on evaluation today patient appears to be doing rather well in regard to his left anterior shin ulcer which is actually completely healed at this point. With that being said he has left great toe ulcer actually appears to be doing better he states over the weekend he actually had a significant amount of drainage from the toe after it had been hurting for some time. He states when his wife change the dressing there was a lot of discharge and fluid he actually took a picture he showed me today. Since that time he has not noted as much tenderness he feels like something may have "popped out" 07/09/18 on evaluation today patient  appears to be doing about the same in regard to his toe ulcer. He really has not shown any signs of significant improvement this seems to just be maintaining at best. He continues to be extremely active in fact I think this may be the biggest issue that he continues to do things as normal in fact right now he's remodeling the bathroom. Obviously I think he is a very active individual and as previously discussed I think a total contact cast would be of great benefit for him. This was discussed with patient yet again today I think we are gonna proceed in this regard. Electronic Signature(s) Signed: 07/10/2018 1:39:21 PM By: Worthy Keeler PA-C Entered By: Worthy Keeler on 07/09/2018 09:42:28 [name], [name] ([id number]) -------------------------------------------------------------------------------- Physical Exam Details Patient Name: Marcus Loll T. Date of Service: 07/09/2018 8:45 AM Medical Record Number: [id number] Patient Account Number: [id number] Date of Birth/Sex: [birth year] (67 y.o. M) Treating RN: Ahmed Prima Primary Care Provider: BESS, KATY Other Clinician: Referring Provider: BESS, KATY Treating Provider/Extender: STONE III, Lisett Dirusso Weeks in Treatment: 78 Constitutional Well-nourished and well-hydrated in no acute distress. Respiratory normal breathing without difficulty. Psychiatric this patient is able to make decisions and demonstrates good insight into disease process. Alert and Oriented x 3. pleasant and cooperative. Notes Patient's wound bed did have slough noted on the surface of the wound which does seem to show evidence of granulation underline. Nonetheless sharp debridement was performed necessitated today post debridement the wound bed appears to be doing better although again I still think he is having issues with this healing due to the fact that he is getting pressure to the site. Now that the shin ulcer is completely healed and seems to be doing well I  think we could definitely consider proceeding with a total contact cast which we have previously discussed. Electronic Signature(s) Signed: 07/10/2018 1:39:21 PM By: Worthy Keeler PA-C Entered By: Worthy Keeler on 07/09/2018 09:43:08 [name], [name] ([id number]) -------------------------------------------------------------------------------- Physician Orders Details Patient Name: [name]. Date of Service: 07/09/2018 8:45 AM Medical Record Number: [id number] Patient Account Number: [id number] Date of Birth/Sex: [birth year] (67 y.o. M) Treating RN: Ahmed Prima Primary Care Provider: BESS, KATY Other Clinician: Referring Provider: BESS, KATY Treating Provider/Extender: STONE III, Shantana Christon Weeks in Treatment: 11 Verbal / Phone Orders: Yes Clinician: Carolyne Fiscal, Debi Read Back and Verified: Yes Diagnosis Coding ICD-10 Coding Code Description I87.2 Venous insufficiency (chronic) (peripheral) Y70.623J Laceration without foreign body, left lower leg, initial encounter L97.822 Non-pressure chronic ulcer of other part of left lower leg with fat layer exposed L97.521 Non-pressure chronic ulcer of other part of left foot limited to breakdown of skin  E11.621 Type 2 diabetes mellitus with foot ulcer I63.9 Cerebral infarction, unspecified I10 Essential (primary) hypertension Wound Cleansing Wound #2 Left Toe Great o Clean wound with Normal Saline. o Cleanse wound with mild soap and water o May Shower, gently pat wound dry prior to applying new dressing. Anesthetic (add to Medication List) Wound #2 Left Toe Great o Topical Lidocaine 4% cream applied to wound bed prior to debridement (In Clinic Only). Primary Wound Dressing Wound #2 Left Toe Great o Silver Alginate Secondary Dressing Wound #2 Left Toe Great o Foam Dressing Change Frequency Wound #2 Left Toe Great o Other: - change on Friday Follow-up Appointments Wound #2 Left Toe Great o Other: - Return Friday  07/13/18 Off-Loading Wound #2 Left Toe Great o Total Contact Cast to Left Lower Extremity o Other: - Keep as much pressure off of toe as you can. [name], [name] ([id number]) Additional Orders / Instructions Wound #2 Left Toe Great o Stop Smoking o Vitamin A; Vitamin C, Zinc - Please add a multivitamin with 100% of these o Increase protein intake. o Activity as tolerated Patient Medications Allergies: bee venom protein (honey bee), penicillin, oyster shell Notifications Medication Indication Start End lidocaine DOSE 1 - topical 4 % cream - 1 cream topical Electronic Signature(s) Signed: 07/10/2018 1:39:21 PM By: Worthy Keeler PA-C Signed: 07/10/2018 5:07:56 PM By: Alric Quan Entered By: Alric Quan on 07/09/2018 09:31:48 Edgell, [name] ([id number]) -------------------------------------------------------------------------------- Prescription 07/09/2018 Patient Name: Marcus Loll T. Provider: Worthy Keeler PA-C Date of Birth: 29-[name]-1952 NPI#: [id number] Sex: [name] DEA#: [id number] Phone #: [phone] License #: Patient Address: [place], [place] [address] [address], [place], [place] [address] [phone] Allergies bee venom protein (honey bee) penicillin oyster shell Medication Medication: Route: Strength: Form: lidocaine 4 % topical cream topical 4% cream Class: TOPICAL LOCAL ANESTHETICS Dose: Frequency / Time: Indication: 1 1 cream topical Number of Refills: Number of Units: 0 Generic Substitution: Start Date: End Date: One Time Use: Substitution Permitted No Note to Pharmacy: Signature(s): Date(s): Electronic Signature(s) Signed: 07/10/2018 1:39:21 PM By: Worthy Keeler PA-C Signed: 07/10/2018 5:07:56 PM By: Luanna Salk ([id number]) Entered By: Alric Quan on 07/09/2018 09:31:48 [name],  [name] ([id number]) --------------------------------------------------------------------------------  Problem List Details Patient Name: Marcus Loll T. Date of Service: 07/09/2018 8:45 AM Medical Record Number: [id number] Patient Account Number: [id number] Date of Birth/Sex: 1951/01/01 (67 y.o. M) Treating RN: Ahmed Prima Primary Care Provider: BESS, KATY Other Clinician: Referring Provider: BESS, KATY Treating Provider/Extender: STONE III, Ida Milbrath Weeks in Treatment: 11 Active Problems ICD-10 Evaluated Encounter Code Description Active Date Today Diagnosis I87.2 Venous insufficiency (chronic) (peripheral) 04/19/2018 No Yes S81.812A Laceration without foreign body, left lower leg, initial 04/19/2018 No Yes encounter L97.822 Non-pressure chronic ulcer of other part of left lower leg with 04/19/2018 No Yes fat layer exposed L97.521 Non-pressure chronic ulcer of other part of left foot limited to 04/19/2018 No Yes breakdown of skin E11.621 Type 2 diabetes mellitus with foot ulcer 04/19/2018 No Yes I63.9 Cerebral infarction, unspecified 04/19/2018 No Yes I10 Essential (primary) hypertension 04/19/2018 No Yes Inactive Problems Resolved Problems Electronic Signature(s) Signed: 07/10/2018 1:39:21 PM By: Worthy Keeler PA-C Entered By: Worthy Keeler on 07/09/2018 09:07:37 Myszka, [name] ([id number]) -------------------------------------------------------------------------------- Progress Note Details Patient Name: Marcus Loll T. Date of Service: 07/09/2018 8:45 AM Medical Record Number: [id number] Patient Account Number: [id number] Date of Birth/Sex: [birth year] (67 y.o. M)  Treating RN: Ahmed Prima Primary Care Provider: BESS, KATY Other Clinician: Referring Provider: BESS, KATY Treating Provider/Extender: STONE III, [place]ena Bellemare Weeks in Treatment: 11 Subjective Chief Complaint Information obtained from Patient Left lower leg and left great toe ulcer History of Present Illness  (HPI) The following HPI elements were documented for the patient's wound: Associated Signs and Symptoms: Patient has a history of chronic venous insufficiency, diabetes mellitus type II, its reform fraction, hypertension 04/19/18 on evaluation today patient presents initially concerning an ulcer of his left great toe and left anterior shin. He states that the shin was definitely a trauma injury where he struck this on a metal pole injuring leg. In regard to the distal left great toe he's really not sure what happened here although he does have a significant callous noted as well. He does have a pacemaker. Fortunately his ABI was 0.98 on the right and 0.83 on the left this appears to be doing fairly well. Overall I'm pleased with that. Nonetheless he states that this really has not seem to be improving. He has been tolerating the dressing changes without complication. The left anterior leg ulcer began on 04/05/18 according to what he tells [place] today although one note I did have for review said it was April 5. I'm inclined to believe it was the fifth she did have an office visit on 04/01/18 with his family nurse practitioner Faith Rogue. Nonetheless either way it's been going on this month for several weeks. The left great toe has actually been present since January 1 he does not know how this occurred. Fortunately has no pain at the site although he does have pain on the left anterior shin. His hemoglobin A1c is 8.0 in March 2019. 04/26/18 on evaluation today patient presents for follow-up concerning his life into your [place] also as well as his left great toe also. Fortunately the shin actually appears to be doing better on evaluation today in my opinion I'm not seeing as much in the way of fluffernutter on the surface although it does still require debridement today this is improved. Size wise it's really not much different. With that being said the total ulcer actually appears to be a little bit  more macerated today I'm not sure why they state that several days ago when this was changed that was not the case. Fortunately is not having severe pain although both areas do hurt the shin is worse. 05/03/18 on evaluation today patient appears to be doing better in regard to his left first toe and left shin ulcers. He is been tolerating the dressing changes without complication and the good news is this seems to be showing signs of getting better. Overall I'm very pleased with the progress that has been made over the past week. Patient is still having pain especially in regard to the left shin. 05/10/18 on evaluation today patient appears to be doing rather well in regard to his two ulcers. The toe ulcer appears to be smaller he does have some undermining however in this had to be cleaned out just a little bit. With that being said overall I feel like this is showing signs of improvement. The left anterior shin ulcer also showing signs of improvement at this time. There does not appear to be any evidence of infection which is good news. 05/21/18 on evaluation today patient appears to be doing very well in regard to his left shin ulcer. He has been tolerating the dressing changes without complication. With that being  said he is having issues at this point with his great toe on the left. We have not x-rayed that at this point I think we may need to. Nonetheless he has pain really at the roughly 5 o'clock location but nowhere else and I really cannot explain this I do not see any obvious form body. Nonetheless he continues to have issues with getting this area to close it's not progressing as nicely. 05/28/18 on evaluation today patient appears to be doing very well in regard to his left lower extremity anterior ulcer. He has been tolerating the dressing changes without complication. Overall this is making excellent progress with the collagen dressing. His left great toe x-ray did return and showed that  he had no if you that normality. He did have a plantar heel spur [name], Marcus T. ([id number]) but this is more degenerative. Otherwise just arthritis was noted no osteomyelitis and no obvious form body was visualized. Overall things seem to be going fairly well. With that being said he still has pain when looking at the toe from the plantar aspect at roughly the six back to the 4 o'clock location which is not quite as bad as last time but still hurts more than any other part of his wound. It does appear the Iodoflex has been of benefit however. 06/04/18 on evaluation today patient appears to be doing excellent in regard to his left anterior shin ulcer. This has made excellent progress and is showing signs of improving in fact very close to completely closing in healing. Nonetheless unfortunately his left great toe has not faired quite as well as far as progress is concerned. He notes that after last week's debridement he actually seem to be doing a little bit better for a couple of days until Thursday of last week when he had increases in discomfort as well is changes in the draining. He states that the drainage has been more green in color according to his wife and what he has seen. He's also had more discomfort. He relates to me again that he really feels like this all started when he stepped on a nail although he does not exactly remember it it seemed to be a puncture wound that he feels may be the culprit for why all this started. Nonetheless I am getting more concerned about the possibility of osteomyelitis despite the fact that the x-ray was negative I was hopeful in this interim since last week to this week that we would see some improvement in the overall appearance of the toe. Unfortunately if anything I feel like it's a little worse. It was macerated but I think we can attribute this to the fact that the patient did have an episode last night we got stuck in the rain and he thinks  the dressing got wet. He did not change it I told him in the future if this were to happen to please go ahead and change the dressing it won't hurt to change it more frequently in that situation. Otherwise other than the maceration I do not see any evidence of anything being any worse but it also does not appear to be any better. 06/11/18 on evaluation today patient's wounds both the great toe as well as the left shin both on the left side show signs of good improvement. Fortunately the patient's culture came back negative in regard to the toe and there does not appear to be any evidence of infection. His MRI is actually scheduled for 06/19/18. Fortunately  this is right before I see him next week and then we can go to the results of the culture at that point. Patient is in agreement with that plan. With that being said otherwise he has been showing signs of having less pain in regard to the toe which is also good news my hope is that he does not have osteomyelitis. 06/18/18 on evaluation today patient appears to be doing very well in regard to his left lower extremity ulcer. He also states at this point in time that he has been tolerating the dressing changes with the toe it appears also to be doing very well at this point. In general I'm happy with the progress that he seems to be making. With that being said he still continues to have pain in regard to the great toe ulcer fortunately this does not seem to be as significant as it was in the beginning but still nonetheless he is having some pain. No fevers, chills, nausea, or vomiting noted at this time. 06/25/18 on evaluation today patient actually appears to be doing very well in regard to his left anterior shin ulcer. He's been tolerating the dressing changes without complication in this seems to have done extremely well. In fact this appears to be healed. With that being said his toe ulcer still continues to get in trouble fortunately not as much as  has been in the past and there have been some signs of improvement and better granulation although I still think he's getting a lot of callous to the area which seems to indicate a lot of pressure and friction. I believe he may benefit from a total contact cast especially now that the left shin ulcer has healed. 07/02/18 on evaluation today patient appears to be doing rather well in regard to his left anterior shin ulcer which is actually completely healed at this point. With that being said he has left great toe ulcer actually appears to be doing better he states over the weekend he actually had a significant amount of drainage from the toe after it had been hurting for some time. He states when his wife change the dressing there was a lot of discharge and fluid he actually took a picture he showed me today. Since that time he has not noted as much tenderness he feels like something may have "popped out" 07/09/18 on evaluation today patient appears to be doing about the same in regard to his toe ulcer. He really has not shown any signs of significant improvement this seems to just be maintaining at best. He continues to be extremely active in fact I think this may be the biggest issue that he continues to do things as normal in fact right now he's remodeling the bathroom. Obviously I think he is a very active individual and as previously discussed I think a total contact cast would be of great benefit for him. This was discussed with patient yet again today I think we are gonna proceed in this regard. Patient History Information obtained from Patient. Family History Cancer - Father, Diabetes - Father, Heart Disease - Mother,Father, Hypertension - Mother, Kidney Disease - Child, No family history of Lung Disease, Seizures, Stroke, Thyroid Problems, Tuberculosis. [name], [name] ([id number]) Social History Current every day smoker - 1 - 1/2 pack daily, Marital Status - Married, Alcohol Use -  Rarely, Drug Use - No History, Caffeine Use - Daily - coffee. Review of Systems (ROS) Constitutional Symptoms (General Health) Denies complaints or symptoms  of Fever, Chills. Respiratory The patient has no complaints or symptoms. Cardiovascular The patient has no complaints or symptoms. Psychiatric The patient has no complaints or symptoms. Objective Constitutional Well-nourished and well-hydrated in no acute distress. Vitals Time Taken: 8:51 AM, Height: 69 in, Weight: 238 lbs, BMI: 35.1, Temperature: 97.5 F, Pulse: 72 bpm, Respiratory Rate: 16 breaths/min, Blood Pressure: 117/65 mmHg. Respiratory normal breathing without difficulty. Psychiatric this patient is able to make decisions and demonstrates good insight into disease process. Alert and Oriented x 3. pleasant and cooperative. General Notes: Patient's wound bed did have slough noted on the surface of the wound which does seem to show evidence of granulation underline. Nonetheless sharp debridement was performed necessitated today post debridement the wound bed appears to be doing better although again I still think he is having issues with this healing due to the fact that he is getting pressure to the site. Now that the shin ulcer is completely healed and seems to be doing well I think we could definitely consider proceeding with a total contact cast which we have previously discussed. Integumentary (Hair, Skin) Wound #2 status is Open. Original cause of wound was Trauma. The wound is located on the Left Toe Great. The wound measures 0.5cm length x 0.5cm width x 0.3cm depth; 0.196cm^2 area and 0.059cm^3 volume. There is Fat Layer (Subcutaneous Tissue) Exposed exposed. There is no tunneling noted, however, there is undermining starting at 12:00 and ending at 12:00 with a maximum distance of 0.3cm. There is a small amount of serosanguineous drainage noted. The wound margin is flat and intact. There is no granulation within the  wound bed. There is a large (67-100%) amount of necrotic tissue within the wound bed including Adherent Slough. The periwound skin appearance did not exhibit: Callus, Crepitus, Excoriation, Induration, Rash, Scarring, Dry/Scaly, Maceration, Atrophie Blanche, Cyanosis, Ecchymosis, Hemosiderin Staining, Mottled, Pallor, Rubor, Erythema. Periwound temperature was noted as No Abnormality. The periwound has tenderness on palpation. 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 There was a Excisional Skin/Subcutaneous Tissue Debridement with a total area of 0.25 sq cm performed by STONE III, Deshae Dickison E., PA-C. With the following instrument(s): Curette to remove Viable and Non-Viable tissue/material. Material removed includes Callus, Subcutaneous Tissue, Slough, and Fibrin/Exudate after achieving pain control using Lidocaine 4% Topical Solution. No specimens were taken. A time out was conducted at 09:25, prior to the start of the procedure. A Moderate amount of bleeding was controlled with Silver Nitrate. The procedure was tolerated well with a pain level of 0 throughout and a pain level of 0 following the procedure. Patient s Level of Consciousness post procedure was recorded as Awake and Alert. Post Debridement Measurements: 0.6cm length x 0.6cm width x 0.3cm depth; 0.085cm^3 volume. Character of Wound/Ulcer Post  Debridement requires further debridement. Severity of Tissue Post Debridement is: Fat layer exposed. Post procedure Diagnosis Wound #2: Same as Pre-Procedure Plan Wound Cleansing: Wound #2 Left Toe Great: Clean wound with Normal Saline. Cleanse wound with mild soap and water  May Shower, gently pat wound dry prior to applying new dressing. Anesthetic (add to Medication List): Wound #2 Left Toe Great: Topical Lidocaine 4% cream applied to wound bed prior to debridement (In Clinic Only). Primary Wound Dressing: Wound #2 Left Toe Great: Silver Alginate Secondary Dressing: Wound #2 Left Toe Great: Foam Dressing Change Frequency: Wound #2 Left Toe Great: Other: - change on Friday [name], [name] ([id number]) Follow-up Appointments: Wound #2 Left Toe Great: Other: - Return Friday 07/13/18 Off-Loading: Wound #2 Left Toe Great: Total Contact Cast to Left Lower Extremity Other: - Keep as much pressure off of toe as you can. Additional Orders / Instructions: Wound #2 Left Toe Great: Stop Smoking Vitamin A; Vitamin C, Zinc - Please add a multivitamin with 100% of these Increase protein intake. Activity as tolerated The following medication(s) was prescribed: lidocaine topical 4 % cream 1 1 cream topical was prescribed at facility Currently I'm gonna suggest that we go ahead and initiate the total contact cast this was applied by myself today during the office visit and he tolerated the application without complication. We will see him back for reevaluation in three days time for the first cast change. Subsequently if he has any concerns the meantime he will contact the office and let us know he knows not to get this wedding if he does to call us as soon as possible to come in and get it cut off. Please see above for specific wound care orders. Electronic Signature(s) Signed: 07/10/2018 1:39:21 PM By: Worthy Keeler PA-C Entered By: Worthy Keeler on 07/09/2018 09:44:02 Lanes,  [name] ([id number]) -------------------------------------------------------------------------------- ROS/PFSH Details Patient Name: [name]. Date of Service: 07/09/2018 8:45 AM Medical Record Number: [id number] Patient Account Number: [id number] Date of Birth/Sex: 09-10-1951 (67 y.o. M) Treating RN: Ahmed Prima Primary Care Provider: BESS, KATY Other Clinician: Referring Provider: BESS, KATY Treating Provider/Extender: STONE III, Keliyah Spillman Weeks in Treatment: 11 Information Obtained From Patient Wound History Do you currently have one or more open woundso Yes How many open wounds do you currently haveo 2 Approximately how long have you had your woundso 2 weeks How have you been treating your wound(s) until nowo yes Doctor Has your wound(s) ever healed and then re-openedo No Have you had any lab work done in the past montho No Have you tested positive for an antibiotic resistant organism (MRSA, VRE)o No Have you tested positive for osteomyelitis (bone infection)o No Have you had any tests for circulation on your legso No Have you had other problems associated with your woundso Infection, Swelling Constitutional Symptoms (General Health) Complaints and Symptoms: Negative for: Fever; Chills Eyes Medical History: Negative for: Cataracts; Glaucoma; Optic Neuritis Ear/Nose/Mouth/Throat Medical History: Negative for: Chronic sinus problems/congestion Hematologic/Lymphatic Medical History: Positive for: Lymphedema Negative for: Anemia; Hemophilia; Human Immunodeficiency Virus; Sickle Cell Disease Respiratory Complaints and Symptoms: No Complaints or Symptoms Medical History: Negative for: Aspiration; Asthma; Chronic Obstructive Pulmonary Disease (COPD); Pneumothorax; Sleep Apnea; Tuberculosis Cardiovascular Complaints and Symptoms: No Complaints or Symptoms Medical History: MOHANNAD, OLIVERO ([id number]) Positive for: Arrhythmia - pacemaker; Coronary Artery Disease;  Hypertension; Myocardial Infarction Negative for: Deep Vein Thrombosis; Hypotension; Peripheral Arterial Disease; Peripheral Venous Disease; Phlebitis; Vasculitis Gastrointestinal Medical History: Negative for: Cirrhosis ; Colitis; Crohnos; Hepatitis A; Hepatitis B; Hepatitis C Endocrine Medical History: Positive for: Type II Diabetes Negative for: Type I Diabetes Time with diabetes: 5 years Treated with: Oral agents Blood sugar tested every day: Yes Tested : daily AM Blood sugar testing results: Breakfast: 174  Genitourinary Medical History: Negative for: End Stage Renal Disease Immunological Medical History: Negative for: Lupus Erythematosus; Raynaudos; Scleroderma Integumentary (Skin) Medical History: Negative for: History of Burn; History of pressure wounds Musculoskeletal Medical History: Negative for: Gout; Rheumatoid Arthritis; Osteoarthritis; Osteomyelitis Neurologic Medical History: Positive for: Neuropathy Negative for: Dementia; Quadriplegia; Paraplegia; Seizure Disorder Oncologic Medical History: Positive for: Received Chemotherapy; Received Radiation - Seeds prostate Psychiatric Complaints and Symptoms: No Complaints or Symptoms Medical History: Negative for: Anorexia/bulimia; Confinement Anxiety Alen, [name] AGUAYO ([id number]) Immunizations Pneumococcal Vaccine: Received Pneumococcal Vaccination: Yes Tetanus Vaccine: Last tetanus shot: 12/25/2017 Implantable Devices Family and Social History Cancer: Yes - Father; Diabetes: Yes - Father; Heart Disease: Yes - Mother,Father; Hypertension: Yes - Mother; Kidney Disease: Yes - Child; Lung Disease: No; Seizures: No; Stroke: No; Thyroid Problems: No; Tuberculosis: No; Current every day smoker - 1 - 1/2 pack daily; Marital Status - Married; Alcohol Use: Rarely; Drug Use: No History; Caffeine Use: Daily - coffee; Financial Concerns: No; Food, Clothing or Shelter Needs: No; Support System Lacking: No;  Transportation Concerns: No Physician Affirmation I have reviewed and agree with the above information. Electronic Signature(s) Signed: 07/10/2018 1:39:21 PM By: Worthy Keeler PA-C Signed: 07/10/2018 5:07:56 PM By: Alric Quan Entered By: Worthy Keeler on 07/09/2018 09:42:46 Dolson, [name] ([id number]) -------------------------------------------------------------------------------- SuperBill Details Patient Name: [name]. Date of Service: 07/09/2018 Medical Record Number: [id number] Patient Account Number: [id number] Date of Birth/Sex: Jul 02, 1951 (67 y.o. M) Treating RN: Ahmed Prima Primary Care Provider: BESS, KATY Other Clinician: Referring Provider: BESS, KATY Treating Provider/Extender: STONE III, Derreon Consalvo Weeks in Treatment: 11 Diagnosis Coding ICD-10 Codes Code Description I87.2 Venous insufficiency (chronic) (peripheral) S81.812A Laceration without foreign body, left lower leg, initial encounter L97.822 Non-pressure chronic ulcer of other part of left lower leg with fat layer exposed L97.521 Non-pressure chronic ulcer of other part of left foot limited to breakdown of skin E11.621 Type 2 diabetes mellitus with foot ulcer I63.9 Cerebral infarction, unspecified I10 Essential (primary) hypertension Facility Procedures CPT4 Code Description: [id number] - DEB SUBQ TISSUE 20 SQ CM/< ICD-10 Diagnosis Description L97.521 Non-pressure chronic ulcer of other part of left foot limited t Modifier: o breakdown of s Quantity: 1 kin Physician Procedures CPT4 Code Description: [id number] - WC PHYS SUBQ TISS 20 SQ CM ICD-10 Diagnosis Description L97.521 Non-pressure chronic ulcer of other part of left foot limited t Modifier: o breakdown of sk Quantity: 1 in Electronic Signature(s) Signed: 07/10/2018 1:39:21 PM By: Worthy Keeler PA-C Entered By: Worthy Keeler on 07/09/2018 09:44:15

## 2018-07-12 ENCOUNTER — Encounter: Payer: PPO | Admitting: Physician Assistant

## 2018-07-12 DIAGNOSIS — E11621 Type 2 diabetes mellitus with foot ulcer: Secondary | ICD-10-CM | POA: Diagnosis not present

## 2018-07-12 DIAGNOSIS — L97522 Non-pressure chronic ulcer of other part of left foot with fat layer exposed: Secondary | ICD-10-CM | POA: Diagnosis not present

## 2018-07-12 DIAGNOSIS — L97822 Non-pressure chronic ulcer of other part of left lower leg with fat layer exposed: Secondary | ICD-10-CM | POA: Diagnosis not present

## 2018-07-15 NOTE — Progress Notes (Signed)
DJANGO, NGUYEN ([id number]) Visit Report for 07/12/2018 Chief Complaint Document Details Patient Name: [name], [name]. Date of Service: 07/12/2018 3:15 PM Medical Record Number: [id number] Patient Account Number: [id number] Date of Birth/Sex: December 09, 1951 (67 y.o. M) Treating RN: Montey Hora Primary Care Provider: BESS, Valetta Fuller Other Clinician: Referring Provider: BESS, KATY Treating Provider/Extender: STONE III, Jiovanny Burdell Weeks in Treatment: 12 Information Obtained from: Patient Chief Complaint Left lower leg and left great toe ulcer Electronic Signature(s) Signed: 07/13/2018 1:32:06 AM By: Worthy Keeler PA-C Entered By: Worthy Keeler on 07/13/2018 01:07:28 Lemen, Huntley Dec ([id number]) -------------------------------------------------------------------------------- Debridement Details Patient Name: [name]. Date of Service: 07/12/2018 3:15 PM Medical Record Number: [id number] Patient Account Number: [id number] Date of Birth/Sex: 20-Jun-1951 (67 y.o. M) Treating RN: Montey Hora Primary Care Provider: BESS, KATY Other Clinician: Referring Provider: BESS, KATY Treating Provider/Extender: STONE III, Livia Tarr Weeks in Treatment: 12 Debridement Performed for Wound #2 Left Toe Great Assessment: Performed By: Physician STONE III, Brie Eppard E., PA-C Debridement Type: Debridement Severity of Tissue Pre Fat layer exposed Debridement: Pre-procedure Verification/Time Yes - 15:51 Out Taken: Start Time: 15:51 Pain Control: Lidocaine 4% Topical Solution Total Area Debrided (L x W): 0.5 (cm) x 0.8 (cm) = 0.4 (cm) Tissue and other material Viable, Non-Viable, Callus, Slough, Subcutaneous, Fibrin/Exudate, Slough debrided: Level: Skin/Subcutaneous Tissue Debridement Description: Excisional Instrument: Curette Bleeding: Minimum Hemostasis Achieved: Pressure End Time: 15:54 Procedural Pain: 0 Post Procedural Pain: 0 Response to Treatment: Procedure was tolerated well Level of  Consciousness: Awake and Alert Post Debridement Measurements of Total Wound Length: (cm) 0.5 Width: (cm) 0.8 Depth: (cm) 0.4 Volume: (cm) 0.126 Character of Wound/Ulcer Post Debridement: Requires Further Debridement Severity of Tissue Post Debridement: Fat layer exposed Post Procedure Diagnosis Same as Pre-procedure Electronic Signature(s) Signed: 07/13/2018 1:32:06 AM By: Worthy Keeler PA-C Signed: 07/15/2018 2:22:32 PM By: Montey Hora Entered By: Montey Hora on 07/12/2018 15:56:29 Liao, Huntley Dec ([id number]) -------------------------------------------------------------------------------- HPI Details Patient Name: Marcus Loll T. Date of Service: 07/12/2018 3:15 PM Medical Record Number: [id number] Patient Account Number: [id number] Date of Birth/Sex: 02/14/1951 (67 y.o. M) Treating RN: Montey Hora Primary Care Provider: BESS, KATY Other Clinician: Referring Provider: BESS, KATY Treating Provider/Extender: STONE III, Alioune Hodgkin Weeks in Treatment: 12 History of Present Illness Associated Signs and Symptoms: Patient has a history of chronic venous insufficiency, diabetes mellitus type II, its reform fraction, hypertension HPI Description: 04/19/18 on evaluation today patient presents initially concerning an ulcer of his left great toe and left anterior shin. He states that the shin was definitely a trauma injury where he struck this on a metal pole injuring leg. In regard to the distal left great toe he's really not sure what happened here although he does have a significant callous noted as well. He does have a pacemaker. Fortunately his ABI was 0.98 on the right and 0.83 on the left this appears to be doing fairly well. Overall I'm pleased with that. Nonetheless he states that this really has not seem to be improving. He has been tolerating the dressing changes without complication. The left anterior leg ulcer began on 04/05/18 according to what he tells [place] today although one  note I did have for review said it was April 5. I'm inclined to believe it was the fifth she did have an office visit on 04/01/18 with his family nurse practitioner Faith Rogue. Nonetheless either way it's been going on this month for several weeks. The left great toe has actually been present since January 1 he does not  know how this occurred. Fortunately has no pain at the site although he does have pain on the left anterior shin. His hemoglobin A1c is 8.0 in March 2019. 04/26/18 on evaluation today patient presents for follow-up concerning his life into your [place] also as well as his left great toe also. Fortunately the shin actually appears to be doing better on evaluation today in my opinion I'm not seeing as much in the way of fluffernutter on the surface although it does still require debridement today this is improved. Size wise it's really not much different. With that being said the total ulcer actually appears to be a little bit more macerated today I'm not sure why they state that several days ago when this was changed that was not the case. Fortunately is not having severe pain although both areas do hurt the shin is worse. 05/03/18 on evaluation today patient appears to be doing better in regard to his left first toe and left shin ulcers. He is been tolerating the dressing changes without complication and the good news is this seems to be showing signs of getting better. Overall I'm very pleased with the progress that has been made over the past week. Patient is still having pain especially in regard to the left shin. 05/10/18 on evaluation today patient appears to be doing rather well in regard to his two ulcers. The toe ulcer appears to be smaller he does have some undermining however in this had to be cleaned out just a little bit. With that being said overall I feel like this is showing signs of improvement. The left anterior shin ulcer also showing signs of improvement at this  time. There does not appear to be any evidence of infection which is good news. 05/21/18 on evaluation today patient appears to be doing very well in regard to his left shin ulcer. He has been tolerating the dressing changes without complication. With that being said he is having issues at this point with his great toe on the left. We have not x-rayed that at this point I think we may need to. Nonetheless he has pain really at the roughly 5 o'clock location but nowhere else and I really cannot explain this I do not see any obvious form body. Nonetheless he continues to have issues with getting this area to close it's not progressing as nicely. 05/28/18 on evaluation today patient appears to be doing very well in regard to his left lower extremity anterior ulcer. He has been tolerating the dressing changes without complication. Overall this is making excellent progress with the collagen dressing. His left great toe x-ray did return and showed that he had no if you that normality. He did have a plantar heel spur but this is more degenerative. Otherwise just arthritis was noted no osteomyelitis and no obvious form body was visualized. Overall things seem to be going fairly well. With that being said he still has pain when looking at the toe from the plantar aspect at roughly the six back to the 4 o'clock location which is not quite as bad as last time but still hurts more than any other part of his wound. It does appear the Iodoflex has been of benefit however. 06/04/18 on evaluation today patient appears to be doing excellent in regard to his left anterior shin ulcer. This has made excellent progress and is showing signs of improving in fact very close to completely closing in healing. Nonetheless unfortunately his left great toe has not  faired quite as well as far as progress is concerned. He notes that after last week's [place], JEOVANNI HEURING. ([id number]) debridement he actually seem to be doing a little bit  better for a couple of days until Thursday of last week when he had increases in discomfort as well is changes in the draining. He states that the drainage has been more green in color according to his wife and what he has seen. He's also had more discomfort. He relates to me again that he really feels like this all started when he stepped on a nail although he does not exactly remember it it seemed to be a puncture wound that he feels may be the culprit for why all this started. Nonetheless I am getting more concerned about the possibility of osteomyelitis despite the fact that the x-ray was negative I was hopeful in this interim since last week to this week that we would see some improvement in the overall appearance of the toe. Unfortunately if anything I feel like it's a little worse. It was macerated but I think we can attribute this to the fact that the patient did have an episode last night we got stuck in the rain and he thinks the dressing got wet. He did not change it I told him in the future if this were to happen to please go ahead and change the dressing it won't hurt to change it more frequently in that situation. Otherwise other than the maceration I do not see any evidence of anything being any worse but it also does not appear to be any better. 06/11/18 on evaluation today patient's wounds both the great toe as well as the left shin both on the left side show signs of good improvement. Fortunately the patient's culture came back negative in regard to the toe and there does not appear to be any evidence of infection. His MRI is actually scheduled for 06/19/18. Fortunately this is right before I see him next week and then we can go to the results of the culture at that point. Patient is in agreement with that plan. With that being said otherwise he has been showing signs of having less pain in regard to the toe which is also good news my hope is that he does not  have osteomyelitis. 06/18/18 on evaluation today patient appears to be doing very well in regard to his left lower extremity ulcer. He also states at this point in time that he has been tolerating the dressing changes with the toe it appears also to be doing very well at this point. In general I'm happy with the progress that he seems to be making. With that being said he still continues to have pain in regard to the great toe ulcer fortunately this does not seem to be as significant as it was in the beginning but still nonetheless he is having some pain. No fevers, chills, nausea, or vomiting noted at this time. 06/25/18 on evaluation today patient actually appears to be doing very well in regard to his left anterior shin ulcer. He's been tolerating the dressing changes without complication in this seems to have done extremely well. In fact this appears to be healed. With that being said his toe ulcer still continues to get in trouble fortunately not as much as has been in the past and there have been some signs of improvement and better granulation although I still think he's getting a lot of callous to the area which seems  to indicate a lot of pressure and friction. I believe he may benefit from a total contact cast especially now that the left shin ulcer has healed. 07/02/18 on evaluation today patient appears to be doing rather well in regard to his left anterior shin ulcer which is actually completely healed at this point. With that being said he has left great toe ulcer actually appears to be doing better he states over the weekend he actually had a significant amount of drainage from the toe after it had been hurting for some time. He states when his wife change the dressing there was a lot of discharge and fluid he actually took a picture he showed me today. Since that time he has not noted as much tenderness he feels like something may have "popped out" 07/09/18 on evaluation today patient  appears to be doing about the same in regard to his toe ulcer. He really has not shown any signs of significant improvement this seems to just be maintaining at best. He continues to be extremely active in fact I think this may be the biggest issue that he continues to do things as normal in fact right now he's remodeling the bathroom. Obviously I think he is a very active individual and as previously discussed I think a total contact cast would be of great benefit for him. This was discussed with patient yet again today I think we are gonna proceed in this regard. 07/12/18 patient presents today for his first total contact cast change. He has had this on for three days and states that though he felt like it may be rubbing a little bit around his ankles there does not appear to be any injury at this point. Overall the wound actually looks better in my pinion. Electronic Signature(s) Signed: 07/13/2018 1:32:06 AM By: Worthy Keeler PA-C Entered By: Worthy Keeler on 07/13/2018 01:08:53 Noone, Huntley Dec ([id number]) -------------------------------------------------------------------------------- Physical Exam Details Patient Name: Marcus Loll T. Date of Service: 07/12/2018 3:15 PM Medical Record Number: [id number] Patient Account Number: [id number] Date of Birth/Sex: 07-02-1951 (67 y.o. M) Treating RN: Montey Hora Primary Care Provider: BESS, Valetta Fuller Other Clinician: Referring Provider: BESS, KATY Treating Provider/Extender: STONE III, Jayziah Bankhead Weeks in Treatment: 60 Constitutional Well-nourished and well-hydrated in no acute distress. Respiratory normal breathing without difficulty. clear to auscultation bilaterally. Cardiovascular regular rate and rhythm with normal S1, S2. Psychiatric this patient is able to make decisions and demonstrates good insight into disease process. Alert and Oriented x 3. pleasant and cooperative. Notes At this point in general patient's wound bed actually show  signs of good improvement at this time. Especially just for three days time. Nonetheless I do think that this is something that I would likely recommend we continue he has done a lot of work even with this on and I think that that maybe some of what he was feeling. With that being said I think that he may need to slow down a little bit while we try to get this healed and then once everything is healed then he can get back to normal activity. Electronic Signature(s) Signed: 07/13/2018 1:32:06 AM By: Worthy Keeler PA-C Entered By: Worthy Keeler on 07/13/2018 01:09:35 Lembke, Huntley Dec ([id number]) -------------------------------------------------------------------------------- Physician Orders Details Patient Name: [name]. Date of Service: 07/12/2018 3:15 PM Medical Record Number: [id number] Patient Account Number: [id number] Date of Birth/Sex: 12/21/1951 (67 y.o. M) Treating RN: Montey Hora Primary Care Provider: Lillard Anes Other Clinician: Referring Provider: BESS, KATY Treating  Provider/Extender: Melburn Hake, Lileigh Fahringer Weeks in Treatment: 12 Verbal / Phone Orders: No Diagnosis Coding Wound Cleansing Wound #2 Left Toe Great o Clean wound with Normal Saline. o Cleanse wound with mild soap and water o May Shower, gently pat wound dry prior to applying new dressing. Anesthetic (add to Medication List) Wound #2 Left Toe Great o Topical Lidocaine 4% cream applied to wound bed prior to debridement (In Clinic Only). Primary Wound Dressing Wound #2 Left Toe Great o Silver Collagen - do not wet with saline Secondary Dressing Wound #2 Left Toe Great o Foam o Drawtex Dressing Change Frequency Wound #2 Left Toe Great o Other: - change on Friday Follow-up Appointments Wound #2 Left Toe Great o Other: - Return Friday 07/13/18 Off-Loading Wound #2 Left Toe Great o Total Contact Cast to Left Lower Extremity o Other: - Keep as much pressure off of toe as you  can. Additional Orders / Instructions Wound #2 Left Toe Great o Stop Smoking o Vitamin A; Vitamin C, Zinc - Please add a multivitamin with 100% of these o Increase protein intake. o Activity as tolerated Electronic Signature(s) CARMELO, REIDEL ([id number]) Signed: 07/13/2018 1:32:06 AM By: Worthy Keeler PA-C Signed: 07/15/2018 2:22:32 PM By: Montey Hora Entered By: Montey Hora on 07/12/2018 15:58:26 Lint, Huntley Dec ([id number]) -------------------------------------------------------------------------------- Problem List Details Patient Name: Marcus Loll T. Date of Service: 07/12/2018 3:15 PM Medical Record Number: [id number] Patient Account Number: [id number] Date of Birth/Sex: 1951/04/29 (67 y.o. M) Treating RN: Montey Hora Primary Care Provider: BESS, KATY Other Clinician: Referring Provider: BESS, KATY Treating Provider/Extender: Melburn Hake, Allissa Albright Weeks in Treatment: 12 Active Problems ICD-10 Evaluated Encounter Code Description Active Date Today Diagnosis I87.2 Venous insufficiency (chronic) (peripheral) 04/19/2018 No Yes S81.812A Laceration without foreign body, left lower leg, initial 04/19/2018 No Yes encounter L97.822 Non-pressure chronic ulcer of other part of left lower leg with 04/19/2018 No Yes fat layer exposed L97.521 Non-pressure chronic ulcer of other part of left foot limited to 04/19/2018 No Yes breakdown of skin E11.621 Type 2 diabetes mellitus with foot ulcer 04/19/2018 No Yes I63.9 Cerebral infarction, unspecified 04/19/2018 No Yes I10 Essential (primary) hypertension 04/19/2018 No Yes Inactive Problems Resolved Problems Electronic Signature(s) Signed: 07/13/2018 1:32:06 AM By: Worthy Keeler PA-C Entered By: Worthy Keeler on 07/13/2018 01:07:22 Swaney, Huntley Dec ([id number]) -------------------------------------------------------------------------------- Progress Note Details Patient Name: Marcus Loll T. Date of Service: 07/12/2018  3:15 PM Medical Record Number: [id number] Patient Account Number: [id number] Date of Birth/Sex: 1951/05/01 (67 y.o. M) Treating RN: Montey Hora Primary Care Provider: BESS, KATY Other Clinician: Referring Provider: BESS, KATY Treating Provider/Extender: Melburn Hake, Shamela Haydon Weeks in Treatment: 12 Subjective Chief Complaint Information obtained from Patient Left lower leg and left great toe ulcer History of Present Illness (HPI) The following HPI elements were documented for the patient's wound: Associated Signs and Symptoms: Patient has a history of chronic venous insufficiency, diabetes mellitus type II, its reform fraction, hypertension 04/19/18 on evaluation today patient presents initially concerning an ulcer of his left great toe and left anterior shin. He states that the shin was definitely a trauma injury where he struck this on a metal pole injuring leg. In regard to the distal left great toe he's really not sure what happened here although he does have a significant callous noted as well. He does have a pacemaker. Fortunately his ABI was 0.98 on the right and 0.83 on the left this appears to be doing fairly well. Overall I'm pleased with that. Nonetheless he states  that this really has not seem to be improving. He has been tolerating the dressing changes without complication. The left anterior leg ulcer began on 04/05/18 according to what he tells [place] today although one note I did have for review said it was April 5. I'm inclined to believe it was the fifth she did have an office visit on 04/01/18 with his family nurse practitioner Faith Rogue. Nonetheless either way it's been going on this month for several weeks. The left great toe has actually been present since January 1 he does not know how this occurred. Fortunately has no pain at the site although he does have pain on the left anterior shin. His hemoglobin A1c is 8.0 in March 2019. 04/26/18 on evaluation today patient presents for  follow-up concerning his life into your [place] also as well as his left great toe also. Fortunately the shin actually appears to be doing better on evaluation today in my opinion I'm not seeing as much in the way of fluffernutter on the surface although it does still require debridement today this is improved. Size wise it's really not much different. With that being said the total ulcer actually appears to be a little bit more macerated today I'm not sure why they state that several days ago when this was changed that was not the case. Fortunately is not having severe pain although both areas do hurt the shin is worse. 05/03/18 on evaluation today patient appears to be doing better in regard to his left first toe and left shin ulcers. He is been tolerating the dressing changes without complication and the good news is this seems to be showing signs of getting better. Overall I'm very pleased with the progress that has been made over the past week. Patient is still having pain especially in regard to the left shin. 05/10/18 on evaluation today patient appears to be doing rather well in regard to his two ulcers. The toe ulcer appears to be smaller he does have some undermining however in this had to be cleaned out just a little bit. With that being said overall I feel like this is showing signs of improvement. The left anterior shin ulcer also showing signs of improvement at this time. There does not appear to be any evidence of infection which is good news. 05/21/18 on evaluation today patient appears to be doing very well in regard to his left shin ulcer. He has been tolerating the dressing changes without complication. With that being said he is having issues at this point with his great toe on the left. We have not x-rayed that at this point I think we may need to. Nonetheless he has pain really at the roughly 5 o'clock location but nowhere else and I really cannot explain this I do not see  any obvious form body. Nonetheless he continues to have issues with getting this area to close it's not progressing as nicely. 05/28/18 on evaluation today patient appears to be doing very well in regard to his left lower extremity anterior ulcer. He has been tolerating the dressing changes without complication. Overall this is making excellent progress with the collagen dressing. His left great toe x-ray did return and showed that he had no if you that normality. He did have a plantar heel spur Ramella, Nyxon T. ([id number]) but this is more degenerative. Otherwise just arthritis was noted no osteomyelitis and no obvious form body was visualized. Overall things seem to be going fairly well. With that being  said he still has pain when looking at the toe from the plantar aspect at roughly the six back to the 4 o'clock location which is not quite as bad as last time but still hurts more than any other part of his wound. It does appear the Iodoflex has been of benefit however. 06/04/18 on evaluation today patient appears to be doing excellent in regard to his left anterior shin ulcer. This has made excellent progress and is showing signs of improving in fact very close to completely closing in healing. Nonetheless unfortunately his left great toe has not faired quite as well as far as progress is concerned. He notes that after last week's debridement he actually seem to be doing a little bit better for a couple of days until Thursday of last week when he had increases in discomfort as well is changes in the draining. He states that the drainage has been more green in color according to his wife and what he has seen. He's also had more discomfort. He relates to me again that he really feels like this all started when he stepped on a nail although he does not exactly remember it it seemed to be a puncture wound that he feels may be the culprit for why all this started. Nonetheless I am getting more concerned  about the possibility of osteomyelitis despite the fact that the x-ray was negative I was hopeful in this interim since last week to this week that we would see some improvement in the overall appearance of the toe. Unfortunately if anything I feel like it's a little worse. It was macerated but I think we can attribute this to the fact that the patient did have an episode last night we got stuck in the rain and he thinks the dressing got wet. He did not change it I told him in the future if this were to happen to please go ahead and change the dressing it won't hurt to change it more frequently in that situation. Otherwise other than the maceration I do not see any evidence of anything being any worse but it also does not appear to be any better. 06/11/18 on evaluation today patient's wounds both the great toe as well as the left shin both on the left side show signs of good improvement. Fortunately the patient's culture came back negative in regard to the toe and there does not appear to be any evidence of infection. His MRI is actually scheduled for 06/19/18. Fortunately this is right before I see him next week and then we can go to the results of the culture at that point. Patient is in agreement with that plan. With that being said otherwise he has been showing signs of having less pain in regard to the toe which is also good news my hope is that he does not have osteomyelitis. 06/18/18 on evaluation today patient appears to be doing very well in regard to his left lower extremity ulcer. He also states at this point in time that he has been tolerating the dressing changes with the toe it appears also to be doing very well at this point. In general I'm happy with the progress that he seems to be making. With that being said he still continues to have pain in regard to the great toe ulcer fortunately this does not seem to be as significant as it was in the beginning but still nonetheless he is having  some pain. No fevers, chills, nausea, or vomiting  noted at this time. 06/25/18 on evaluation today patient actually appears to be doing very well in regard to his left anterior shin ulcer. He's been tolerating the dressing changes without complication in this seems to have done extremely well. In fact this appears to be healed. With that being said his toe ulcer still continues to get in trouble fortunately not as much as has been in the past and there have been some signs of improvement and better granulation although I still think he's getting a lot of callous to the area which seems to indicate a lot of pressure and friction. I believe he may benefit from a total contact cast especially now that the left shin ulcer has healed. 07/02/18 on evaluation today patient appears to be doing rather well in regard to his left anterior shin ulcer which is actually completely healed at this point. With that being said he has left great toe ulcer actually appears to be doing better he states over the weekend he actually had a significant amount of drainage from the toe after it had been hurting for some time. He states when his wife change the dressing there was a lot of discharge and fluid he actually took a picture he showed me today. Since that time he has not noted as much tenderness he feels like something may have "popped out" 07/09/18 on evaluation today patient appears to be doing about the same in regard to his toe ulcer. He really has not shown any signs of significant improvement this seems to just be maintaining at best. He continues to be extremely active in fact I think this may be the biggest issue that he continues to do things as normal in fact right now he's remodeling the bathroom. Obviously I think he is a very active individual and as previously discussed I think a total contact cast would be of great benefit for him. This was discussed with patient yet again today I think we are gonna proceed  in this regard. 07/12/18 patient presents today for his first total contact cast change. He has had this on for three days and states that though he felt like it may be rubbing a little bit around his ankles there does not appear to be any injury at this point. Overall the wound actually looks better in my pinion. Patient History Information obtained from Patient. RAFE, MACKOWSKI ([id number]) Family History Cancer - Father, Diabetes - Father, Heart Disease - Mother,Father, Hypertension - Mother, Kidney Disease - Child, No family history of Lung Disease, Seizures, Stroke, Thyroid Problems, Tuberculosis. Social History Current every day smoker - 1 - 1/2 pack daily, Marital Status - Married, Alcohol Use - Rarely, Drug Use - No History, Caffeine Use - Daily - coffee. Review of Systems (ROS) Constitutional Symptoms (General Health) Denies complaints or symptoms of Fever, Chills. Respiratory The patient has no complaints or symptoms. Cardiovascular The patient has no complaints or symptoms. Psychiatric The patient has no complaints or symptoms. Objective Constitutional Well-nourished and well-hydrated in no acute distress. Vitals Time Taken: 3:29 PM, Height: 69 in, Weight: 238 lbs, BMI: 35.1, Temperature: 98.0 F, Pulse: 83 bpm, Respiratory Rate: 18 breaths/min, Blood Pressure: 118/59 mmHg. Respiratory normal breathing without difficulty. clear to auscultation bilaterally. Cardiovascular regular rate and rhythm with normal S1, S2. Psychiatric this patient is able to make decisions and demonstrates good insight into disease process. Alert and Oriented x 3. pleasant and cooperative. General Notes: At this point in general patient's wound bed actually show  signs of good improvement at this time. Especially just for three days time. Nonetheless I do think that this is something that I would likely recommend we continue he has done a lot of work even with this on and I think that that maybe  some of what he was feeling. With that being said I think that he may need to slow down a little bit while we try to get this healed and then once everything is healed then he can get back to normal activity. Integumentary (Hair, Skin) Wound #2 status is Open. Original cause of wound was Trauma. The wound is located on the Left Toe Great. The wound measures 0.5cm length x 0.8cm width x 0.3cm depth; 0.314cm^2 area and 0.094cm^3 volume. There is Fat Layer (Subcutaneous Tissue) Exposed exposed. There is no tunneling noted, however, there is undermining starting at 3:00 and ending at 9:00 with a maximum distance of 0.3cm. There is a medium amount of serosanguineous drainage noted. The wound margin is flat and intact. There is no granulation within the wound bed. There is a large (67-100%) amount of necrotic tissue within the wound bed including Adherent Slough. The periwound skin appearance did not exhibit: Callus, Crepitus, Klasen, Yerik T. ([id number]) Excoriation, Induration, Rash, Scarring, Dry/Scaly, Maceration, Atrophie Blanche, Cyanosis, Ecchymosis, Hemosiderin Staining, Mottled, Pallor, Rubor, Erythema. Periwound temperature was noted as No Abnormality. The periwound has tenderness on palpation. 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 There was a Excisional Skin/Subcutaneous Tissue Debridement with a total area of 0.4 sq cm  performed by STONE III, Brok Stocking E., PA-C. With the following instrument(s): Curette to remove Viable and Non-Viable tissue/material. Material removed includes Callus, Subcutaneous Tissue, Slough, and Fibrin/Exudate after achieving pain control using Lidocaine 4% Topical Solution. No specimens were taken. A time out was conducted at 15:51, prior to the start of the procedure. A Minimum amount of bleeding was controlled with Pressure. The procedure was tolerated well with a pain level of 0 throughout and a pain level of 0 following the procedure. Patient s Level of Consciousness post procedure was recorded as Awake and Alert. Post Debridement Measurements: 0.5cm length x 0.8cm width x 0.4cm depth; 0.126cm^3 volume. Character of Wound/Ulcer Post Debridement requires further debridement. Severity of Tissue Post Debridement is: Fat layer exposed. Post procedure Diagnosis Wound #2: Same as Pre-Procedure Plan Wound Cleansing: Wound #2 Left Toe Great: Clean wound with Normal Saline. Cleanse wound with mild soap and water May Shower, gently pat wound dry prior to applying new dressing. Anesthetic (add to Medication List): Wound #2 Left Toe Great: Topical Lidocaine 4% cream applied to wound bed prior to debridement (In Clinic Only). Primary Wound Dressing: Wound #2 Left Toe GreatBIRCH, FARINO ([id number]) Silver Collagen - do not wet with saline Secondary Dressing: Wound #2 Left Toe Great: Foam Drawtex Dressing Change Frequency: Wound #2 Left Toe Great: Other: - change on Friday Follow-up Appointments: Wound #2 Left Toe Great: Other: - Return Friday 07/13/18 Off-Loading: Wound #2 Left Toe Great: Total Contact Cast to Left  Lower Extremity Other: - Keep as much pressure off of toe as you can. Additional Orders / Instructions: Wound #2 Left Toe Great: Stop Smoking Vitamin A; Vitamin C, Zinc - Please add a multivitamin with 100% of these Increase protein intake. Activity as tolerated I  am going to suggest currently that we continue with the total contact cast that we did reapply that today that was applied by myself during the office visit. The patient post application seem to be doing well he states that it feels comfortable we did have some extra padding around the ankles medial and lateral. We will see were things stand on Tuesday at follow-up. Please see above for specific wound care orders. We will see patient for re-evaluation in 1 week(s) here in the clinic. If anything worsens or changes patient will contact our office for additional recommendations. Electronic Signature(s) Signed: 07/13/2018 1:32:06 AM By: Worthy Keeler PA-C Entered By: Worthy Keeler on 07/13/2018 01:10:12 Lachapelle, Huntley Dec ([id number]) -------------------------------------------------------------------------------- ROS/PFSH Details Patient Name: [name]. Date of Service: 07/12/2018 3:15 PM Medical Record Number: [id number] Patient Account Number: [id number] Date of Birth/Sex: [birth year] (67 y.o. M) Treating RN: Montey Hora Primary Care Provider: BESS, KATY Other Clinician: Referring Provider: BESS, KATY Treating Provider/Extender: STONE III, Arlone Lenhardt Weeks in Treatment: 12 Information Obtained From Patient Wound History Do you currently have one or more open woundso Yes How many open wounds do you currently haveo 2 Approximately how long have you had your woundso 2 weeks How have you been treating your wound(s) until nowo yes Doctor Has your wound(s) ever healed and then re-openedo No Have you had any lab work done in the past montho No Have you tested positive for an antibiotic resistant organism (MRSA, VRE)o No Have you tested positive for osteomyelitis (bone infection)o No Have you had any tests for circulation on your legso No Have you had other problems associated with your woundso Infection, Swelling Constitutional Symptoms (General Health) Complaints and Symptoms: Negative  for: Fever; Chills Eyes Medical History: Negative for: Cataracts; Glaucoma; Optic Neuritis Ear/Nose/Mouth/Throat Medical History: Negative for: Chronic sinus problems/congestion Hematologic/Lymphatic Medical History: Positive for: Lymphedema Negative for: Anemia; Hemophilia; Human Immunodeficiency Virus; Sickle Cell Disease Respiratory Complaints and Symptoms: No Complaints or Symptoms Medical History: Negative for: Aspiration; Asthma; Chronic Obstructive Pulmonary Disease (COPD); Pneumothorax; Sleep Apnea; Tuberculosis Cardiovascular Complaints and Symptoms: No Complaints or Symptoms Medical History: DICKSON, KOSTELNIK ([id number]) Positive for: Arrhythmia - pacemaker; Coronary Artery Disease; Hypertension; Myocardial Infarction Negative for: Deep Vein Thrombosis; Hypotension; Peripheral Arterial Disease; Peripheral Venous Disease; Phlebitis; Vasculitis Gastrointestinal Medical History: Negative for: Cirrhosis ; Colitis; Crohnos; Hepatitis A; Hepatitis B; Hepatitis C Endocrine Medical History: Positive for: Type II Diabetes Negative for: Type I Diabetes Time with diabetes: 5 years Treated with: Oral agents Blood sugar tested every day: Yes Tested : daily AM Blood sugar testing results: Breakfast: 174 Genitourinary Medical History: Negative for: End Stage Renal Disease Immunological Medical History: Negative for: Lupus Erythematosus; Raynaudos; Scleroderma Integumentary (Skin) Medical History: Negative for: History of Burn; History of pressure wounds Musculoskeletal Medical History: Negative for: Gout; Rheumatoid Arthritis; Osteoarthritis; Osteomyelitis Neurologic Medical History: Positive for: Neuropathy Negative for: Dementia; Quadriplegia; Paraplegia; Seizure Disorder Oncologic Medical History: Positive for: Received Chemotherapy; Received Radiation - Seeds prostate Psychiatric Complaints and Symptoms: No Complaints or Symptoms Medical History: Negative  for: Anorexia/bulimia; Confinement Anxiety Jain, JADYN BARGE ([id number]) Immunizations Pneumococcal Vaccine: Received Pneumococcal Vaccination: Yes Tetanus Vaccine: Last tetanus shot: 12/25/2017 Implantable Devices Family and Social History Cancer: Yes -  Father; Diabetes: Yes - Father; Heart Disease: Yes - Mother,Father; Hypertension: Yes - Mother; Kidney Disease: Yes - Child; Lung Disease: No; Seizures: No; Stroke: No; Thyroid Problems: No; Tuberculosis: No; Current every day smoker - 1 - 1/2 pack daily; Marital Status - Married; Alcohol Use: Rarely; Drug Use: No History; Caffeine Use: Daily - coffee; Financial Concerns: No; Food, Clothing or Shelter Needs: No; Support System Lacking: No; Transportation Concerns: No Physician Affirmation I have reviewed and agree with the above information. Electronic Signature(s) Signed: 07/13/2018 1:32:06 AM By: Worthy Keeler PA-C Signed: 07/15/2018 2:22:32 PM By: Montey Hora Entered By: Worthy Keeler on 07/13/2018 01:09:15 Nedrow, Huntley Dec ([id number]) -------------------------------------------------------------------------------- SuperBill Details Patient Name: [name]. Date of Service: 07/12/2018 Medical Record Number: [id number] Patient Account Number: [id number] Date of Birth/Sex: 08-Feb-1951 (67 y.o. M) Treating RN: Montey Hora Primary Care Provider: BESS, KATY Other Clinician: Referring Provider: BESS, KATY Treating Provider/Extender: Melburn Hake, Kalasia Crafton Weeks in Treatment: 12 Diagnosis Coding ICD-10 Codes Code Description I87.2 Venous insufficiency (chronic) (peripheral) S81.812A Laceration without foreign body, left lower leg, initial encounter L97.822 Non-pressure chronic ulcer of other part of left lower leg with fat layer exposed L97.521 Non-pressure chronic ulcer of other part of left foot limited to breakdown of skin E11.621 Type 2 diabetes mellitus with foot ulcer I63.9 Cerebral infarction, unspecified I10 Essential  (primary) hypertension Facility Procedures CPT4 Code Description: 49201007 11042 - DEB SUBQ TISSUE 20 SQ CM/< ICD-10 Diagnosis Description L97.521 Non-pressure chronic ulcer of other part of left foot limited t Modifier: o breakdown of s Quantity: 1 kin Physician Procedures CPT4 Code Description: [id number] - WC PHYS SUBQ TISS 20 SQ CM ICD-10 Diagnosis Description L97.521 Non-pressure chronic ulcer of other part of left foot limited t Modifier: o breakdown of sk Quantity: 1 in Electronic Signature(s) Signed: 07/13/2018 1:32:06 AM By: Worthy Keeler PA-C Entered By: Worthy Keeler on 07/13/2018 01:10:38

## 2018-07-15 NOTE — Progress Notes (Signed)
MARCELLIUS, MONTAGNA ([id number]) Visit Report for 07/12/2018 Arrival Information Details Patient Name: GERRARD, CRYSTAL. Date of Service: 07/12/2018 3:15 PM Medical Record Number: [id number] Patient Account Number: [id number] Date of Birth/Sex: 02-02-1951 (67 y.o. M) Treating RN: Roger Shelter Primary Care Roisin Mones: BESS, KATY Other Clinician: Referring Natayah Warmack: BESS, KATY Treating Adolpho Meenach/Extender: STONE III, HOYT Weeks in Treatment: 12 Visit Information History Since Last Visit All ordered tests and consults were completed: No Patient Arrived: Ambulatory Added or deleted any medications: No Arrival Time: 15:28 Any new allergies or adverse reactions: No Accompanied By: self Had a fall or experienced change in No Transfer Assistance: None activities of daily living that may affect Patient Identification Verified: Yes risk of falls: Secondary Verification Process Completed: Yes Signs or symptoms of abuse/neglect since last visito No Patient Has Alerts: Yes Hospitalized since last visit: No Patient Alerts: Type II Diabetic Implantable device outside of the clinic excluding No cellular tissue based products placed in the center since last visit: Pain Present Now: No Electronic Signature(s) Signed: 07/12/2018 4:43:15 PM By: Roger Shelter Entered By: Roger Shelter on 07/12/2018 15:29:14 [place]onewold, Huntley Dec ([id number]) -------------------------------------------------------------------------------- Encounter Discharge Information Details Patient Name: [name] Date of Service: 07/12/2018 3:15 PM Medical Record Number: [id number] Patient Account Number: [id number] Date of Birth/Sex: [birth year] (67 y.o. M) Treating RN: Ahmed Prima Primary Care Mykenna Viele: BESS, KATY Other Clinician: Referring Mahlon Gabrielle: BESS, KATY Treating Elwood Bazinet/Extender: Melburn Hake, HOYT Weeks in Treatment: 12 Encounter Discharge Information Items Discharge Condition: Stable Ambulatory Status:  Ambulatory Discharge Destination: Home Transportation: Private Auto Accompanied By: self Schedule Follow-up Appointment: Yes Clinical Summary of Care: Electronic Signature(s) Signed: 07/12/2018 4:43:47 PM By: Alric Quan Entered By: Alric Quan on 07/12/2018 16:43:47 Pecha, Huntley Dec ([id number]) -------------------------------------------------------------------------------- Lower Extremity Assessment Details Patient Name: [name] Date of Service: 07/12/2018 3:15 PM Medical Record Number: [id number] Patient Account Number: [id number] Date of Birth/Sex: [birth year] (67 y.o. M) Treating RN: Roger Shelter Primary Care Denya Buckingham: BESS, KATY Other Clinician: Referring Namrata Dangler: BESS, KATY Treating Joseph Johns/Extender: STONE III, HOYT Weeks in Treatment: 12 Edema Assessment Assessed: [Left: No] [Right: No] Edema: [Left: Ye] [Right: s] Calf Left: Right: Point of Measurement: 36 cm From Medial Instep 38.5 cm cm Ankle Left: Right: Point of Measurement: 11 cm From Medial Instep 25.5 cm cm Vascular Assessment Claudication: Claudication Assessment [Left:None] Pulses: Dorsalis Pedis Palpable: [Left:Yes] Posterior Tibial Extremity colors, hair growth, and conditions: Extremity Color: [Left:Hyperpigmented] Hair Growth on Extremity: [Left:No] Temperature of Extremity: [Left:Warm] Capillary Refill: [Left:< 3 seconds] Toe Nail Assessment Left: Right: Thick: No Discolored: No Deformed: No Improper Length and Hygiene: No Electronic Signature(s) Signed: 07/12/2018 4:43:15 PM By: Roger Shelter Entered By: Roger Shelter on 07/12/2018 15:42:51 Bargar, Huntley Dec ([id number]) -------------------------------------------------------------------------------- Multi Wound Chart Details Patient Name: [name] Date of Service: 07/12/2018 3:15 PM Medical Record Number: [id number] Patient Account Number: [id number] Date of Birth/Sex: [birth year] (67 y.o. M) Treating  RN: Montey Hora Primary Care Evellyn Tuff: BESS, KATY Other Clinician: Referring Sosha Shepherd: BESS, KATY Treating Amariyana Heacox/Extender: STONE III, HOYT Weeks in Treatment: 12 Vital Signs Height(in): 69 Pulse(bpm): 83 Weight(lbs): 238 Blood Pressure(mmHg): 118/59 Body Mass Index(BMI): 35 Temperature(F): 98.0 Respiratory Rate 18 (breaths/min): Photos: [2:No Photos] [N/A:N/A] Wound Location: [2:Left Toe Great] [N/A:N/A] Wounding Event: [2:Trauma] [N/A:N/A] Primary Etiology: [2:Diabetic Wound/Ulcer of the Lower Extremity] [N/A:N/A] Secondary Etiology: [2:Trauma, Other] [N/A:N/A] Comorbid History: [2:Lymphedema, Arrhythmia, Coronary Artery Disease, Hypertension, Myocardial Infarction, Type II Diabetes, Neuropathy, Received Chemotherapy, Received Radiation] [N/A:N/A] Date Acquired: [2:12/25/2017] [N/A:N/A] Weeks of Treatment: [2:12] [N/A:N/A] Wound Status: [2:Open] [N/A:N/A]  Pending Amputation on [2:Yes] [N/A:N/A] Presentation: Measurements L x W x D [2:0.5x0.8x0.3] [N/A:N/A] (cm) Area (cm) : [2:0.314] [N/A:N/A] Volume (cm) : [2:0.094] [N/A:N/A] % Reduction in Area: [2:-568.10%] [N/A:N/A] % Reduction in Volume: [2:-1780.00%] [N/A:N/A] Starting Position 1 [2:3] (o'clock): Ending Position 1 [2:9] (o'clock): Maximum Distance 1 (cm): [2:0.3] Undermining: [2:Yes] [N/A:N/A] Classification: [2:Grade 1] [N/A:N/A] Exudate Amount: [2:Medium] [N/A:N/A] Exudate Type: [2:Serosanguineous] [N/A:N/A] Exudate Color: [2:red, brown] [N/A:N/A] Wound Margin: [2:Flat and Intact] [N/A:N/A] Granulation Amount: [2:None Present (0%)] [N/A:N/A] Necrotic Amount: [2:Large (67-100%)] [N/A:N/A] Exposed Structures: Fat Layer (Subcutaneous N/A N/A Tissue) Exposed: Yes Fascia: No Tendon: No Muscle: No Joint: No Bone: No Epithelialization: Small (1-33%) N/A N/A Periwound Skin Texture: Excoriation: No N/A N/A Induration: No Callus: No Crepitus: No Rash: No Scarring: No Periwound Skin  Moisture: Maceration: No N/A N/A Dry/Scaly: No Periwound Skin Color: Atrophie Blanche: No N/A N/A Cyanosis: No Ecchymosis: No Erythema: No Hemosiderin Staining: No Mottled: No Pallor: No Rubor: No Temperature: No Abnormality N/A N/A Tenderness on Palpation: Yes N/A N/A Wound Preparation: Ulcer Cleansing: N/A N/A Rinsed/Irrigated with Saline Topical Anesthetic Applied: Other: lidocaine 4% Treatment Notes Electronic Signature(s) Signed: 07/15/2018 2:22:32 PM By: Montey Hora Entered By: Montey Hora on 07/12/2018 15:51:07 Rody, Huntley Dec ([id number]) -------------------------------------------------------------------------------- [place] Details Patient Name: [name] Date of Service: 07/12/2018 3:15 PM Medical Record Number: [id number] Patient Account Number: [id number] Date of Birth/Sex: 1951-01-21 (67 y.o. M) Treating RN: Montey Hora Primary Care Callen Vancuren: BESS, KATY Other Clinician: Referring Kiesha Ensey: BESS, KATY Treating Eknoor Novack/Extender: STONE III, HOYT Weeks in Treatment: 12 Active Inactive ` Abuse / Safety / Falls / Self Care Management Nursing Diagnoses: Potential for falls Goals: Patient will remain injury free related to falls Date Initiated: 04/19/2018 Target Resolution Date: 05/04/2018 Goal Status: Active Interventions: Assess fall risk on admission and as needed Notes: ` Nutrition Nursing Diagnoses: Impaired glucose control: actual or potential Goals: Patient/caregiver agrees to and verbalizes understanding of need to use nutritional supplements and/or vitamins as prescribed Date Initiated: 04/19/2018 Target Resolution Date: 06/07/2018 Goal Status: Active Patient/caregiver verbalizes understanding of need to maintain therapeutic glucose control per primary care physician Date Initiated: 04/19/2018 Target Resolution Date: 05/31/2018 Goal Status: Active Interventions: Assess HgA1c results as ordered upon admission and  as needed Assess patient nutrition upon admission and as needed per policy Notes: ` Orientation to the Wound Care Program Nursing Diagnoses: Knowledge deficit related to the wound healing center program Goals: Patient/caregiver will verbalize understanding of the [place] DEMETRIS, CAPELL ([id number]) Date Initiated: 04/19/2018 Target Resolution Date: 06/29/2018 Goal Status: Active Interventions: Provide education on orientation to the wound center Notes: ` Wound/Skin Impairment Nursing Diagnoses: Impaired tissue integrity Goals: Ulcer/skin breakdown will heal within 14 weeks Date Initiated: 04/19/2018 Target Resolution Date: 06/29/2018 Goal Status: Active Interventions: Assess patient/caregiver ability to obtain necessary supplies Assess patient/caregiver ability to perform ulcer/skin care regimen upon admission and as needed Assess ulceration(s) every visit Notes: Electronic Signature(s) Signed: 07/15/2018 2:22:32 PM By: Montey Hora Entered By: Montey Hora on 07/12/2018 15:50:41 Salah, Huntley Dec ([id number]) -------------------------------------------------------------------------------- Pain Assessment Details Patient Name: [name] Date of Service: 07/12/2018 3:15 PM Medical Record Number: [id number] Patient Account Number: [id number] Date of Birth/Sex: [birth year] (67 y.o. M) Treating RN: Roger Shelter Primary Care Snyder Colavito: BESS, KATY Other Clinician: Referring Andre Swander: BESS, KATY Treating Taliana Mersereau/Extender: STONE III, HOYT Weeks in Treatment: 12 Active Problems Location of Pain Severity and Description of Pain Patient Has Paino No Site Locations Pain Management and Medication Current Pain Management: Electronic  Signature(s) Signed: 07/12/2018 4:43:15 PM By: Roger Shelter Entered By: Roger Shelter on 07/12/2018 15:29:21 Moman, Huntley Dec  ([id number]) -------------------------------------------------------------------------------- Wound Assessment Details Patient Name: [name] Date of Service: 07/12/2018 3:15 PM Medical Record Number: [id number] Patient Account Number: [id number] Date of Birth/Sex: 07/28/1951 (67 y.o. M) Treating RN: Roger Shelter Primary Care Terah Robey: BESS, KATY Other Clinician: Referring Yoseline Andersson: BESS, KATY Treating Zaylin Runco/Extender: STONE III, HOYT Weeks in Treatment: 12 Wound Status Wound Number: 2 Primary Diabetic Wound/Ulcer of the Lower Extremity Etiology: Wound Location: Left Toe Great Secondary Trauma, Other Wounding Event: Trauma Etiology: Date Acquired: 12/25/2017 Wound Open Weeks Of Treatment: 12 Status: Clustered Wound: No Comorbid Lymphedema, Arrhythmia, Coronary Artery Pending Amputation On Presentation History: Disease, Hypertension, Myocardial Infarction, Type II Diabetes, Neuropathy, Received Chemotherapy, Received Radiation Photos Photo Uploaded By: Roger Shelter on 07/12/2018 16:25:41 Wound Measurements Length: (cm) 0.5 Width: (cm) 0.8 Depth: (cm) 0.3 Area: (cm) 0.314 Volume: (cm) 0.094 % Reduction in Area: -568.1% % Reduction in Volume: -1780% Epithelialization: Small (1-33%) Tunneling: No Undermining: Yes Starting Position (o'clock): 3 Ending Position (o'clock): 9 Maximum Distance: (cm) 0.3 Wound Description Classification: Grade 1 Wound Margin: Flat and Intact Exudate Amount: Medium Exudate Type: Serosanguineous Exudate Color: red, brown Foul Odor After Cleansing: No Slough/Fibrino Yes Wound Bed Granulation Amount: None Present (0%) Exposed Structure Necrotic Amount: Large (67-100%) Fascia Exposed: No Feighner, Huntley Dec ([id number]) Necrotic Quality: Adherent Slough Fat Layer (Subcutaneous Tissue) Exposed: Yes Tendon Exposed: No Muscle Exposed: No Joint Exposed: No Bone Exposed: No Periwound Skin Texture Texture Color No  Abnormalities Noted: No No Abnormalities Noted: No Callus: No Atrophie Blanche: No Crepitus: No Cyanosis: No Excoriation: No Ecchymosis: No Induration: No Erythema: No Rash: No Hemosiderin Staining: No Scarring: No Mottled: No Pallor: No Moisture Rubor: No No Abnormalities Noted: No Dry / Scaly: No Temperature / Pain Maceration: No Temperature: No Abnormality Tenderness on Palpation: Yes Wound Preparation Ulcer Cleansing: Rinsed/Irrigated with Saline Topical Anesthetic Applied: Other: lidocaine 4%, Treatment Notes Wound #2 (Left Toe Great) 1. Cleansed with: Clean wound with Normal Saline Cleanse wound with antibacterial soap and water 2. Anesthetic Topical Lidocaine 4% cream to wound bed prior to debridement 4. Dressing Applied: Prisma Ag 5. Secondary Dressing Applied ABD Pad Foam 6. Footwear/Offloading device applied Removable Cast Walker/Walking Boot 7. Secured with Tape Notes foam with cut out, gauze then wrap with conform wrap with tape Electronic Signature(s) Signed: 07/12/2018 4:43:15 PM By: Roger Shelter Entered By: Roger Shelter on 07/12/2018 15:41:26 [place], Huntley Dec ([id number]) -------------------------------------------------------------------------------- Vitals Details Patient Name: [name] Date of Service: 07/12/2018 3:15 PM Medical Record Number: [id number] Patient Account Number: [id number] Date of Birth/Sex: 1951-06-06 (67 y.o. M) Treating RN: Roger Shelter Primary Care Amier Hoyt: BESS, KATY Other Clinician: Referring Derrion Tritz: BESS, KATY Treating Mariadelcarmen Corella/Extender: STONE III, HOYT Weeks in Treatment: 12 Vital Signs Time Taken: 15:29 Temperature (F): 98.0 Height (in): 69 Pulse (bpm): 83 Weight (lbs): 238 Respiratory Rate (breaths/min): 18 Body Mass Index (BMI): 35.1 Blood Pressure (mmHg): 118/59 Reference Range: 80 - 120 mg / dl Electronic Signature(s) Signed: 07/12/2018 4:43:15 PM By: Roger Shelter Entered By:  Roger Shelter on 07/12/2018 15:29:40

## 2018-07-16 ENCOUNTER — Ambulatory Visit
Admission: RE | Admit: 2018-07-16 | Discharge: 2018-07-16 | Disposition: A | Discharge status: Home / Self Care | Payer: PPO | Source: Ambulatory Visit | Attending: Urology | Admitting: Urology

## 2018-07-16 ENCOUNTER — Encounter: Payer: PPO | Admitting: Physician Assistant

## 2018-07-16 DIAGNOSIS — L97822 Non-pressure chronic ulcer of other part of left lower leg with fat layer exposed: Secondary | ICD-10-CM | POA: Diagnosis not present

## 2018-07-16 DIAGNOSIS — D3002 Benign neoplasm of left kidney: Secondary | ICD-10-CM

## 2018-07-16 DIAGNOSIS — E11621 Type 2 diabetes mellitus with foot ulcer: Secondary | ICD-10-CM | POA: Diagnosis not present

## 2018-07-16 DIAGNOSIS — L97522 Non-pressure chronic ulcer of other part of left foot with fat layer exposed: Secondary | ICD-10-CM | POA: Diagnosis not present

## 2018-07-16 DIAGNOSIS — N200 Calculus of kidney: Secondary | ICD-10-CM | POA: Diagnosis not present

## 2018-07-16 MED ORDER — GADOBENATE DIMEGLUMINE 529 MG/ML IV SOLN
20.0000 mL | Freq: Once | INTRAVENOUS | Status: AC | PRN
  Administered 2018-07-16: 20 mL via INTRAVENOUS

## 2018-07-18 NOTE — Progress Notes (Signed)
Subjective:    Patient ID: [name], male    DOB: 02-Sep-1951, 67 y.o.   MRN: [id number]  HPI:  [name] is here for regular f/u- HTN, HLD, T2D He has been taking all medications as directed, denies SE He estimates to smoke He continues to be seen by wound care- LLE wound  04/19/18 on evaluation today patient presents initially concerning an ulcer of his left great toe and left anterior shin.  07/12/18 patient presents today for his first total contact cast change. He has had this on for three days and states that though he felt like it may be rubbing a little bit around his ankles there does not appear to be any injury at this point. Overall the wound actually looks better in my pinion. He presents today with large bulky dressing and walking boot on LLE He has compression sock on RLE He reports decrease in LLE pain He has appt with Urology this week, for possible TURP? He will f/u with cards this fall He has been using the Libra continuous blood glucose monitoring system, AM 80-120s A1c today- 7! He continues to push water and has been trying to follow heart healthy diet He remains under a lot a of stress, caring for ailing mother and all of his health issues and is not ready to stop tobacco use  Patient Care Team    Relationship Specialty Notifications Start End  Marcus Grandchild, NP PCP - General Family Medicine  11/27/17   Marcus Klein, MD PCP - Cardiology Cardiology Admissions 01/16/18     Patient Active Problem List   Diagnosis Date Noted  . Encounter for Medicare annual wellness exam 06/20/2018  . Laceration of left lower leg 04/17/2018  . Laceration of skin of left lower leg 04/01/2018  . Increased urinary frequency 04/01/2018  . Hematuria 01/30/2018  . Puncture wound of toe of left foot 01/07/2018  . At risk for diabetic foot ulcer 01/07/2018  . Need for pneumococcal vaccination 11/27/2017  . Screening for colon cancer 11/27/2017  . Healthcare maintenance  11/27/2017  . Paroxysmal atrial fibrillation ([place]) 11/22/2016  . History of embolic stroke 25/85/2778  . Diabetes mellitus type 2 in obese ([place]) 11/22/2016  . Venous insufficiency of both lower extremities 09/21/2016  . Stroke ([place]) 08/12/2016  . Hyperglycemia   . Essential hypertension   . Vision, loss, sudden   . CVA (cerebral infarction) 08/11/2016  . CAD - inferior MI 2009, CABG 2009, patent grafts cath 04/2012 09/23/2013  . Obesity 09/23/2013  . Hyperlipidemia 09/23/2013  . Tobacco abuse 09/23/2013  . Prostate cancer ([place]) 08/18/2011  . Myocardial infarction [facility])      Past Medical History:  Diagnosis Date  . Chronic venous insufficiency LOWER EXTREMITIES  . Coronary artery disease CARDIOLOGIST - DR  EUMPNTIR-  LAST 1 WK AGO -- WILL REQUEST NOTE AND STRESS TEST  . Diabetes mellitus without complication ([place])   . Hyperglycemia   . Hyperlipidemia   . Hypertension   . Mixed dyslipidemia   . Myocardial infarction ([place])   . Neuropathy   . Nocturia   . Prostate cancer ([place]) 08/18/11   gleason 8, volume 24.4cc  . S/P CABG x 4   . ST elevation MI (STEMI) ([place]) 02-10-2008   S/P CABG  . Stroke ([place])   . Urinary hesitancy   . Vision abnormalities      Past Surgical History:  Procedure Laterality Date  . CARDIAC CATHETERIZATION  05/01/2012   grafts widely  patent  . CARDIOVASCULAR STRESS TEST  03-15-2010   INFERIOR WALL SCAR WITHOUT ANY MEANINGFUL ISCHEMIA/ EF 46% / LOW RISK SCAN  . CORONARY ARTERY BYPASS GRAFT  02-10-2008  DR [name]   X4 VESSEL DISEASE / [facility] CABG  . CYSTOSCOPY  02/08/2012   Procedure: CYSTOSCOPY FLEXIBLE;  Surgeon: Marcus Gallo, MD;  Location: [facility];  Service: Urology;;  . EP IMPLANTABLE DEVICE N/A 08/14/2016   Procedure: Loop Recorder Insertion;  Surgeon: Marcus Lance, MD;  Location: [place] CV LAB;  Service: Cardiovascular;  Laterality: N/A;  . KNEE SURGERY  age 55   RIGHT  . LEFT HEART CATHETERIZATION WITH  CORONARY/GRAFT ANGIOGRAM N/A 05/01/2012   Procedure: LEFT HEART CATHETERIZATION WITH Beatrix Fetters;  Surgeon: Marcus Klein, MD;  Location: [place] CATH LAB;  Service: Cardiovascular;  Laterality: N/A;  . MULTIPLE TEETH EXTRACTIONS (23)/ FOUR QUADRANT ALVEOLPLASTY/ MANDIBULAR LATERAL EXOSTOSES REDUCTIONS  03-24-2010   CHRONIC PERIODONTITIS  . RADIOACTIVE SEED IMPLANT  02/08/2012   Procedure: RADIOACTIVE SEED IMPLANT;  Surgeon: Marcus Gallo, MD;  Location: [facility];  Service: Urology;  Laterality: N/A;  C-ARM   . SHOULDER SURGERY  1992   LEFT  . TEE WITHOUT CARDIOVERSION N/A 08/14/2016   Procedure: TRANSESOPHAGEAL ECHOCARDIOGRAM (TEE);  Surgeon: Marcus Hector, MD;  Location: [facility] ENDOSCOPY;  Service: Cardiovascular;  Laterality: N/A;     Family History  Problem Relation Age of Onset  . Cancer Father        pancreatic  . Diabetes Father      Social History   Substance and Sexual Activity  Drug Use No     Social History   Substance and Sexual Activity  Alcohol Use Yes  . Alcohol/week: 1.2 oz  . Types: 1 Cans of beer, 1 Shots of liquor per week   Comment: occassional     Social History   Tobacco Use  Smoking Status Current Every Day Smoker  . Packs/day: 1.50  . Years: 50.00  . Pack years: 75.00  . Types: Cigarettes  Smokeless Tobacco Never Used  Tobacco Comment   PREVIOUSLY SMOKED 3PPD / DECREASED TO 1PPD SINCE 2009     Outpatient Encounter Medications as of 07/22/2018  Medication Sig  . apixaban (ELIQUIS) 5 MG TABS tablet Take 1 tablet (5 mg total) by mouth 2 (two) times daily.  Marland Kitchen atorvastatin (LIPITOR) 40 MG tablet Take 1 tablet (40 mg total) by mouth daily.  Marland Kitchen buPROPion (WELLBUTRIN XL) 150 MG 24 hr tablet one tablet twice daily  . Continuous Blood Gluc Receiver (FREESTYLE LIBRE 14 DAY READER) DEVI 1 Device by Does not apply route daily. Use to check blood sugars every morning and 2 hours after largest meals  . Continuous Blood Gluc  Sensor (FREESTYLE LIBRE 14 DAY SENSOR) MISC 1 each by Does not apply route daily. Use to check blood sugars every morning fasting and 2 hours after largest meal  . dapagliflozin propanediol (FARXIGA) 5 MG TABS tablet Take 5 mg by mouth daily.  . finasteride (PROSCAR) 5 MG tablet Take 5 mg by mouth daily.  Marland Kitchen gabapentin (NEURONTIN) 300 MG capsule TAKE 1 CAPSULE BY MOUTH IN THE MORNING AND 2 TABLETS AT [facility] TIME  . glucose blood (FREESTYLE LITE) test strip Use to check blood sugars every morning fasting  . lisinopril (PRINIVIL,ZESTRIL) 5 MG tablet Take 0.5 tablets (2.5 mg total) by mouth daily.  . metFORMIN (GLUCOPHAGE) 1000 MG tablet Take 1 tablet (1,000 mg total) by mouth 2 (two) times daily  with a meal.  . mirabegron ER (MYRBETRIQ) 25 MG TB24 tablet Take 25 mg by mouth daily.  . Multiple Vitamins-Minerals (MULTIVITAMIN ADULT PO) Take 1 tablet by mouth daily.   . tamsulosin (FLOMAX) 0.4 MG CAPS capsule Take 1 capsule by mouth daily.  . [DISCONTINUED] Continuous Blood Gluc Sensor (FREESTYLE LIBRE 14 DAY SENSOR) MISC 1 each by Does not apply route daily. Use to check blood sugars every morning fasting and 2 hours after largest meal  . nicotine (NICODERM CQ) 21 mg/24hr patch Place 1 patch (21 mg total) onto the skin daily.  . silodosin (RAPAFLO) 4 MG CAPS capsule Take 1 capsule (4 mg total) by mouth daily with breakfast.  . [DISCONTINUED] gabapentin (NEURONTIN) 300 MG capsule Take 1 tablet every morning and afternoon. 2 tablets at dinner time.   No facility-administered encounter medications on file as of 07/22/2018.     Allergies: Bee venom; Oysters [shellfish allergy]; and Penicillins  Body mass index is 33.56 kg/m.  Blood pressure 104/67, pulse 77, resp. rate 17, weight 224 lb (101.6 kg), SpO2 96 %.  Review of Systems  Constitutional: Positive for fatigue. Negative for activity change, appetite change, chills, diaphoresis, fever and unexpected weight change.  Respiratory: Negative for  cough, chest tightness, shortness of breath, wheezing and stridor.   Cardiovascular: Positive for leg swelling. Negative for chest pain and palpitations.  Gastrointestinal: Negative for abdominal distention, abdominal pain, blood in stool, constipation, diarrhea, nausea and vomiting.  Genitourinary: Positive for hematuria. Negative for difficulty urinating and flank pain.  Musculoskeletal: Positive for arthralgias, gait problem, joint swelling and myalgias. Negative for back pain, neck pain and neck stiffness.  Skin: Positive for wound.  Neurological: Negative for dizziness and headaches.  Hematological: Does not bruise/bleed easily.  Psychiatric/Behavioral: Negative for behavioral problems, confusion, decreased concentration, dysphoric mood, hallucinations, self-injury, sleep disturbance and suicidal ideas. The patient is not nervous/anxious and is not hyperactive.        Objective:   Physical Exam  Constitutional: He is oriented to person, place, and time. He appears well-developed and well-nourished. No distress.  HENT:  Head: Normocephalic and atraumatic.  Right Ear: External ear normal.  Left Ear: External ear normal.  Nose: Nose normal.  Mouth/Throat: Oropharynx is clear and moist.  Eyes: Conjunctivae and EOM are normal.  Pinpoint pupils  Cardiovascular: Normal rate, regular rhythm, normal heart sounds and intact distal pulses.  No murmur heard. Pulmonary/Chest: Breath sounds normal. No stridor. No respiratory distress. He has no wheezes. He has no rales. He exhibits no tenderness.  Musculoskeletal: He exhibits edema. He exhibits no tenderness.       Right ankle: He exhibits swelling.  Scant RLE edema- compression sock on Unable to visualize LLE due to dressing/boot  Neurological: He is alert and oriented to person, place, and time.  Skin: Skin is warm and dry. Capillary refill takes less than 2 seconds. No rash noted. He is not diaphoretic. No erythema. No pallor.  Unable to  visualize LLE wound due to bulking dressing, due for dressing change tomorrow at wound care  Psychiatric: He has a normal mood and affect. His behavior is normal. Judgment and thought content normal.      Assessment & Plan:   1. Diabetes mellitus type 2 in obese ([place])   2. Screening for colon cancer   3. Healthcare maintenance   4. Hematuria, unspecified type   5. Puncture wound of toe of left foot, initial encounter   6. Tobacco abuse   7. Essential hypertension  8. Coronary artery disease involving native coronary artery of native heart without angina pectoris     Healthcare maintenance Great job on blood sugar control- A1c-7 today! Continue to follow Diabetic Diet and move as often as possible. Please continue with Urologist, Wound Care, and Cardiologist as directed. Try to reduce tobacco use- YOU CAN DO IT! Continue to stay well hydrated. Cologuard order placed.  Hematuria Prostate Ca 2014 Followed by [facility] Has OV this week  Diabetes mellitus type 2 in obese [facility]) Lab Results  Component Value Date   HGBA1C 7.0 (A) 07/22/2018   HGBA1C 8.0 (H) 03/14/2018   HGBA1C 8.0 02/25/2018   AM BS: 80-120 Using Continuous Blood Glucose Monitoring System Continue Metformin 1000mg  BID and Farxiga 5mg  QD  Puncture wound of toe of left foot Followed by Wound Care  Screening for colon cancer Declined Colonoscopy Agreed to Cologuard- order placed  Tobacco abuse Declined smoking cessation today, encouraged to at least reduce use by a few cigarettes/day  Essential hypertension BP at goal 104/67, HR 77 Followed by cards Currently on Lisinopril 5mg  QD  CAD - inferior MI 2009, CABG 2009, patent grafts cath 04/2012 Apixaban 5mg  BID Followed by Cards/Dr. Sallyanne Kuster     FOLLOW-UP:  Return in about 3 months (around 10/22/2018) for Regular Follow Up, HTN, Diabetes.

## 2018-07-22 ENCOUNTER — Ambulatory Visit (INDEPENDENT_AMBULATORY_CARE_PROVIDER_SITE_OTHER): Payer: PPO | Admitting: Adult Health

## 2018-07-22 ENCOUNTER — Ambulatory Visit (INDEPENDENT_AMBULATORY_CARE_PROVIDER_SITE_OTHER): Payer: PPO | Admitting: *Deleted

## 2018-07-22 ENCOUNTER — Encounter: Payer: Self-pay | Admitting: Adult Health

## 2018-07-22 VITALS — BP 104/67 | HR 77 | Resp 17 | Wt 224.0 lb

## 2018-07-22 DIAGNOSIS — I2510 Atherosclerotic heart disease of native coronary artery without angina pectoris: Secondary | ICD-10-CM | POA: Diagnosis not present

## 2018-07-22 DIAGNOSIS — S91139A Puncture wound without foreign body of unspecified toe(s) without damage to nail, initial encounter: Secondary | ICD-10-CM

## 2018-07-22 DIAGNOSIS — I63411 Cerebral infarction due to embolism of right middle cerebral artery: Secondary | ICD-10-CM

## 2018-07-22 DIAGNOSIS — Z1211 Encounter for screening for malignant neoplasm of colon: Secondary | ICD-10-CM | POA: Diagnosis not present

## 2018-07-22 DIAGNOSIS — E669 Obesity, unspecified: Secondary | ICD-10-CM | POA: Diagnosis not present

## 2018-07-22 DIAGNOSIS — R319 Hematuria, unspecified: Secondary | ICD-10-CM

## 2018-07-22 DIAGNOSIS — I10 Essential (primary) hypertension: Secondary | ICD-10-CM

## 2018-07-22 DIAGNOSIS — E1169 Type 2 diabetes mellitus with other specified complication: Secondary | ICD-10-CM

## 2018-07-22 DIAGNOSIS — Z720 Tobacco use: Secondary | ICD-10-CM | POA: Diagnosis not present

## 2018-07-22 DIAGNOSIS — Z0000 Encounter for general adult medical examination without abnormal findings: Secondary | ICD-10-CM | POA: Diagnosis not present

## 2018-07-22 LAB — POCT GLYCOSYLATED HEMOGLOBIN (HGB A1C): Hemoglobin A1C: 7 % — AB (ref 4.0–5.6)

## 2018-07-22 MED ORDER — FREESTYLE LIBRE 14 DAY SENSOR MISC: 1.0000 | Freq: Every day | 2 refills | Status: DC

## 2018-07-22 NOTE — Assessment & Plan Note (Signed)
Great job on blood sugar control- A1c-7 today! Continue to follow Diabetic Diet and move as often as possible. Please continue with Urologist, Wound Care, and Cardiologist as directed. Try to reduce tobacco use- YOU CAN DO IT! Continue to stay well hydrated. Cologuard order placed.

## 2018-07-22 NOTE — Assessment & Plan Note (Signed)
BP at goal 104/67, HR 77 Followed by cards Currently on Lisinopril 5mg  QD

## 2018-07-22 NOTE — Assessment & Plan Note (Signed)
Followed by Wound Care

## 2018-07-22 NOTE — Assessment & Plan Note (Signed)
Prostate Ca 2014 Followed by [facility] Has OV this week

## 2018-07-22 NOTE — Assessment & Plan Note (Signed)
Lab Results  Component Value Date   HGBA1C 7.0 (A) 07/22/2018   HGBA1C 8.0 (H) 03/14/2018   HGBA1C 8.0 02/25/2018   AM BS: 80-120 Using Continuous Blood Glucose Monitoring System Continue Metformin 1000mg  BID and Farxiga 5mg  QD

## 2018-07-22 NOTE — Assessment & Plan Note (Signed)
Declined smoking cessation today, encouraged to at least reduce use by a few cigarettes/day

## 2018-07-22 NOTE — Progress Notes (Signed)
Carelink Summary Report / Loop Recorder 

## 2018-07-22 NOTE — Assessment & Plan Note (Signed)
Apixaban 5mg  BID Followed by Cards/Dr. Sallyanne Kuster

## 2018-07-22 NOTE — Patient Instructions (Addendum)
Diabetes Mellitus and Nutrition When you have diabetes (diabetes mellitus), it is very important to have healthy eating habits because your blood sugar (glucose) levels are greatly affected by what you eat and drink. Eating healthy foods in the appropriate amounts, at about the same times every day, can help you:  Control your blood glucose.  Lower your risk of heart disease.  Improve your blood pressure.  Reach or maintain a healthy weight.  Every person with diabetes is different, and each person has different needs for a meal plan. Your health care provider may recommend that you work with a diet and nutrition specialist (dietitian) to make a meal plan that is best for you. Your meal plan may vary depending on factors such as:  The calories you need.  The medicines you take.  Your weight.  Your blood glucose, blood pressure, and cholesterol levels.  Your activity level.  Other health conditions you have, such as heart or kidney disease.  How do carbohydrates affect me? Carbohydrates affect your blood glucose level more than any other type of food. Eating carbohydrates naturally increases the amount of glucose in your blood. Carbohydrate counting is a method for keeping track of how many carbohydrates you eat. Counting carbohydrates is important to keep your blood glucose at a healthy level, especially if you use insulin or take certain oral diabetes medicines. It is important to know how many carbohydrates you can safely have in each meal. This is different for every person. Your dietitian can help you calculate how many carbohydrates you should have at each meal and for snack. Foods that contain carbohydrates include:  Bread, cereal, rice, pasta, and crackers.  Potatoes and corn.  Peas, beans, and lentils.  Milk and yogurt.  Fruit and juice.  Desserts, such as cakes, cookies, ice cream, and candy.  How does alcohol affect me? Alcohol can cause a sudden decrease in blood  glucose (hypoglycemia), especially if you use insulin or take certain oral diabetes medicines. Hypoglycemia can be a life-threatening condition. Symptoms of hypoglycemia (sleepiness, dizziness, and confusion) are similar to symptoms of having too much alcohol. If your health care provider says that alcohol is safe for you, follow these guidelines:  Limit alcohol intake to no more than 1 drink per day for nonpregnant women and 2 drinks per day for men. One drink equals 12 oz of beer, 5 oz of wine, or 1 oz of hard liquor.  Do not drink on an empty stomach.  Keep yourself hydrated with water, diet soda, or unsweetened iced tea.  Keep in mind that regular soda, juice, and other mixers may contain a lot of sugar and must be counted as carbohydrates.  What are tips for following this plan? Reading food labels  Start by checking the serving size on the label. The amount of calories, carbohydrates, fats, and other nutrients listed on the label are based on one serving of the food. Many foods contain more than one serving per package.  Check the total grams (g) of carbohydrates in one serving. You can calculate the number of servings of carbohydrates in one serving by dividing the total carbohydrates by 15. For example, if a food has 30 g of total carbohydrates, it would be equal to 2 servings of carbohydrates.  Check the number of grams (g) of saturated and trans fats in one serving. Choose foods that have low or no amount of these fats.  Check the number of milligrams (mg) of sodium in one serving. Most people   should limit total sodium intake to less than 2,300 mg per day.  Always check the nutrition information of foods labeled as "low-fat" or "nonfat". These foods may be higher in added sugar or refined carbohydrates and should be avoided.  Talk to your dietitian to identify your daily goals for nutrients listed on the label. Shopping  Avoid buying canned, premade, or processed foods. These  foods tend to be high in fat, sodium, and added sugar.  Shop around the outside edge of the grocery store. This includes fresh fruits and vegetables, bulk grains, fresh meats, and fresh dairy. Cooking  Use low-heat cooking methods, such as baking, instead of high-heat cooking methods like deep frying.  Cook using healthy oils, such as olive, canola, or sunflower oil.  Avoid cooking with butter, cream, or high-fat meats. Meal planning  Eat meals and snacks regularly, preferably at the same times every day. Avoid going long periods of time without eating.  Eat foods high in fiber, such as fresh fruits, vegetables, beans, and whole grains. Talk to your dietitian about how many servings of carbohydrates you can eat at each meal.  Eat 4-6 ounces of lean protein each day, such as lean meat, chicken, fish, eggs, or tofu. 1 ounce is equal to 1 ounce of meat, chicken, or fish, 1 egg, or 1/4 cup of tofu.  Eat some foods each day that contain healthy fats, such as avocado, nuts, seeds, and fish. Lifestyle   Check your blood glucose regularly.  Exercise at least 30 minutes 5 or more days each week, or as told by your health care provider.  Take medicines as told by your health care provider.  Do not use any products that contain nicotine or tobacco, such as cigarettes and e-cigarettes. If you need help quitting, ask your health care provider.  Work with a Social worker or diabetes educator to identify strategies to manage stress and any emotional and social challenges. What are some questions to ask my health care provider?  Do I need to meet with a diabetes educator?  Do I need to meet with a dietitian?  What number can I call if I have questions?  When are the best times to check my blood glucose? Where to find more information:  American Diabetes Association: diabetes.org/food-and-fitness/food  Academy of Nutrition and Dietetics:  PokerClues.dk  [employer] of Diabetes and Digestive and Kidney Diseases (NIH): ContactWire.be Summary  A healthy meal plan will help you control your blood glucose and maintain a healthy lifestyle.  Working with a diet and nutrition specialist (dietitian) can help you make a meal plan that is best for you.  Keep in mind that carbohydrates and alcohol have immediate effects on your blood glucose levels. It is important to count carbohydrates and to use alcohol carefully. This information is not intended to replace advice given to you by your health care provider. Make sure you discuss any questions you have with your health care provider. Document Released: 09/07/2005 Document Revised: 01/15/2017 Document Reviewed: 01/15/2017 Elsevier Interactive Patient Education  2018 [place] job on blood sugar control- A1c-7 today! Continue to follow Diabetic Diet and move as often as possible. Please continue with Urologist, Wound Care, and Cardiologist as directed. Try to reduce tobacco use- YOU CAN DO IT! Continue to stay well hydrated. Cologuard order placed. Follow-up in 3 months. NICE TO SEE YOU!

## 2018-07-22 NOTE — Assessment & Plan Note (Signed)
Declined Colonoscopy Agreed to Cologuard- order placed

## 2018-07-23 ENCOUNTER — Encounter: Payer: PPO | Admitting: Physician Assistant

## 2018-07-23 DIAGNOSIS — L97822 Non-pressure chronic ulcer of other part of left lower leg with fat layer exposed: Secondary | ICD-10-CM | POA: Diagnosis not present

## 2018-07-23 DIAGNOSIS — L97522 Non-pressure chronic ulcer of other part of left foot with fat layer exposed: Secondary | ICD-10-CM | POA: Diagnosis not present

## 2018-07-23 DIAGNOSIS — E11621 Type 2 diabetes mellitus with foot ulcer: Secondary | ICD-10-CM | POA: Diagnosis not present

## 2018-07-25 ENCOUNTER — Ambulatory Visit (HOSPITAL_COMMUNITY)
Admission: RE | Admit: 2018-07-25 | Discharge: 2018-07-25 | Disposition: A | Discharge status: Home / Self Care | Payer: PPO | Source: Ambulatory Visit | Attending: Cardiology | Admitting: Cardiology

## 2018-07-25 DIAGNOSIS — Z136 Encounter for screening for cardiovascular disorders: Secondary | ICD-10-CM | POA: Diagnosis not present

## 2018-07-29 ENCOUNTER — Telehealth: Payer: Self-pay | Admitting: Adult Health

## 2018-07-30 ENCOUNTER — Encounter: Payer: PPO | Attending: Nurse Practitioner | Admitting: Nurse Practitioner

## 2018-07-30 DIAGNOSIS — Z923 Personal history of irradiation: Secondary | ICD-10-CM | POA: Diagnosis not present

## 2018-07-30 DIAGNOSIS — Z9221 Personal history of antineoplastic chemotherapy: Secondary | ICD-10-CM | POA: Insufficient documentation

## 2018-07-30 DIAGNOSIS — L97522 Non-pressure chronic ulcer of other part of left foot with fat layer exposed: Secondary | ICD-10-CM | POA: Diagnosis not present

## 2018-07-30 DIAGNOSIS — E1140 Type 2 diabetes mellitus with diabetic neuropathy, unspecified: Secondary | ICD-10-CM | POA: Insufficient documentation

## 2018-07-30 DIAGNOSIS — I10 Essential (primary) hypertension: Secondary | ICD-10-CM | POA: Diagnosis not present

## 2018-07-30 DIAGNOSIS — E11621 Type 2 diabetes mellitus with foot ulcer: Secondary | ICD-10-CM | POA: Diagnosis not present

## 2018-07-30 DIAGNOSIS — I252 Old myocardial infarction: Secondary | ICD-10-CM | POA: Diagnosis not present

## 2018-07-30 DIAGNOSIS — Z950 Presence of cardiac pacemaker: Secondary | ICD-10-CM | POA: Diagnosis not present

## 2018-07-30 DIAGNOSIS — Z8673 Personal history of transient ischemic attack (TIA), and cerebral infarction without residual deficits: Secondary | ICD-10-CM | POA: Insufficient documentation

## 2018-07-30 DIAGNOSIS — I872 Venous insufficiency (chronic) (peripheral): Secondary | ICD-10-CM | POA: Diagnosis not present

## 2018-07-30 DIAGNOSIS — I2510 Atherosclerotic heart disease of native coronary artery without angina pectoris: Secondary | ICD-10-CM | POA: Diagnosis not present

## 2018-07-30 DIAGNOSIS — F17210 Nicotine dependence, cigarettes, uncomplicated: Secondary | ICD-10-CM | POA: Diagnosis not present

## 2018-07-30 LAB — CUP PACEART REMOTE DEVICE CHECK
Date Time Interrogation Session: 20190626003654
MDC IDC PG IMPLANT DT: 20170821

## 2018-08-04 NOTE — Progress Notes (Signed)
MAXIM, BEDEL ([id number]) Visit Report for 07/23/2018 Arrival Information Details Patient Name: [name], [name]. Date of Service: 07/23/2018 8:45 AM Medical Record Number: [id number] Patient Account Number: [id number] Date of Birth/Sex: October 22, 1951 (67 y.o. M) Treating RN: Montey Hora Primary Care Kiona Blume: BESS, KATY Other Clinician: Referring Venus Ruhe: BESS, KATY Treating Savalas Monje/Extender: Melburn Hake, HOYT Weeks in Treatment: 13 Visit Information History Since Last Visit Added or deleted any medications: No Patient Arrived: Ambulatory Any new allergies or adverse reactions: No Arrival Time: 08:55 Had a fall or experienced change in No Accompanied By: spouse activities of daily living that may affect Transfer Assistance: None risk of falls: Patient Identification Verified: Yes Signs or symptoms of abuse/neglect since last visito No Secondary Verification Process Completed: Yes Hospitalized since last visit: No Patient Has Alerts: Yes Implantable device outside of the clinic excluding No Patient Alerts: Type II Diabetic cellular tissue based products placed in the center since last visit: Has Dressing in Place as Prescribed: Yes Pain Present Now: No Electronic Signature(s) Signed: 07/23/2018 5:54:46 PM By: Montey Hora Entered By: Montey Hora on 07/23/2018 08:55:39 Bergsma, Huntley Dec ([id number]) -------------------------------------------------------------------------------- Lower Extremity Assessment Details Patient Name: Marcus Loll T. Date of Service: 07/23/2018 8:45 AM Medical Record Number: [id number] Patient Account Number: [id number] Date of Birth/Sex: 11-13-1951 (67 y.o. M) Treating RN: Secundino Ginger Primary Care Haaris Metallo: BESS, KATY Other Clinician: Referring Haifa Hatton: BESS, KATY Treating Kainoah Bartosiewicz/Extender: STONE III, HOYT Weeks in Treatment: 13 Edema Assessment Assessed: [Left: No] [Right: No] [Left: Edema] [Right: :] Calf Left: Right: Point of  Measurement: 36 cm From Medial Instep 38.5 cm cm Ankle Left: Right: Point of Measurement: 11 cm From Medial Instep 24 cm cm Vascular Assessment Claudication: Claudication Assessment [Left:None] Pulses: Dorsalis Pedis Palpable: [Left:Yes] Posterior Tibial Extremity colors, hair growth, and conditions: Extremity Color: [Left:Hyperpigmented] Temperature of Extremity: [Left:Warm] Capillary Refill: [Left:< 3 seconds] Toe Nail Assessment Left: Right: Thick: Yes Discolored: Yes Deformed: Yes Improper Length and Hygiene: No Electronic Signature(s) Signed: 07/26/2018 4:42:53 PM By: Secundino Ginger Entered By: Secundino Ginger on 07/23/2018 09:01:56 Tiede, Huntley Dec ([id number]) -------------------------------------------------------------------------------- Multi Wound Chart Details Patient Name: Marcus Loll T. Date of Service: 07/23/2018 8:45 AM Medical Record Number: [id number] Patient Account Number: [id number] Date of Birth/Sex: 1951/10/06 (67 y.o. M) Treating RN: Cornell Barman Primary Care Izaih Kataoka: BESS, KATY Other Clinician: Referring Tomoya Ringwald: BESS, KATY Treating Jlyn Cerros/Extender: STONE III, HOYT Weeks in Treatment: 13 Vital Signs Height(in): 69 Pulse(bpm): 74 Weight(lbs): 238 Blood Pressure(mmHg): 114/64 Body Mass Index(BMI): 35 Temperature(F): 97.6 Respiratory Rate 16 (breaths/min): Photos: [N/A:N/A] Wound Location: Left Toe Great N/A N/A Wounding Event: Trauma N/A N/A Primary Etiology: Diabetic Wound/Ulcer of the N/A N/A Lower Extremity Secondary Etiology: Trauma, Other N/A N/A Comorbid History: Lymphedema, Arrhythmia, N/A N/A Coronary Artery Disease, Hypertension, Myocardial Infarction, Type II Diabetes, Neuropathy, Received Chemotherapy, Received Radiation Date Acquired: 12/25/2017 N/A N/A Weeks of Treatment: 13 N/A N/A Wound Status: Open N/A N/A Pending Amputation on Yes N/A N/A Presentation: Measurements L x W x D 0.1x0.2x0.2 N/A N/A (cm) Area (cm) : 0.016 N/A  N/A Volume (cm) : 0.003 N/A N/A % Reduction in Area: 66.00% N/A N/A % Reduction in Volume: 40.00% N/A N/A Classification: Grade 1 N/A N/A Exudate Amount: Small N/A N/A Exudate Type: Serous N/A N/A Exudate Color: amber N/A N/A Wound Margin: Flat and Intact N/A N/A Granulation Amount: Large (67-100%) N/A N/A Granulation Quality: Pink N/A N/A Necrotic Amount: Small (1-33%) N/A N/A Kilgour, Huntley Dec ([id number]) Exposed Structures: Fat Layer (Subcutaneous N/A N/A Tissue) Exposed: Yes Fascia: No  Tendon: No Muscle: No Joint: No Bone: No Epithelialization: Medium (34-66%) N/A N/A Periwound Skin Texture: Excoriation: No N/A N/A Induration: No Callus: No Crepitus: No Rash: No Scarring: No Periwound Skin Moisture: Maceration: No N/A N/A Dry/Scaly: No Periwound Skin Color: Atrophie Blanche: No N/A N/A Cyanosis: No Ecchymosis: No Erythema: No Hemosiderin Staining: No Mottled: No Pallor: No Rubor: No Temperature: No Abnormality N/A N/A Tenderness on Palpation: Yes N/A N/A Wound Preparation: Ulcer Cleansing: N/A N/A Rinsed/Irrigated with Saline Topical Anesthetic Applied: Other: lidocaine 4% Treatment Notes Electronic Signature(s) Signed: 07/26/2018 6:17:48 PM By: Gretta Cool, BSN, RN, CWS, Kim RN, BSN Entered By: Gretta Cool, BSN, RN, CWS, Kim on 07/23/2018 09:22:06 Dimon, Huntley Dec ([id number]) -------------------------------------------------------------------------------- [place] Details Patient Name: [name]. Date of Service: 07/23/2018 8:45 AM Medical Record Number: [id number] Patient Account Number: [id number] Date of Birth/Sex: [birth year] (67 y.o. M) Treating RN: Cornell Barman Primary Care Marik Sedore: BESS, KATY Other Clinician: Referring Quincie Haroon: BESS, KATY Treating Odin Mariani/Extender: Melburn Hake, HOYT Weeks in Treatment: 13 Active Inactive ` Abuse / Safety / Falls / Self Care Management Nursing Diagnoses: Potential for falls Goals: Patient will  remain injury free related to falls Date Initiated: 04/19/2018 Target Resolution Date: 05/04/2018 Goal Status: Active Interventions: Assess fall risk on admission and as needed Notes: ` Nutrition Nursing Diagnoses: Impaired glucose control: actual or potential Goals: Patient/caregiver agrees to and verbalizes understanding of need to use nutritional supplements and/or vitamins as prescribed Date Initiated: 04/19/2018 Target Resolution Date: 06/07/2018 Goal Status: Active Patient/caregiver verbalizes understanding of need to maintain therapeutic glucose control per primary care physician Date Initiated: 04/19/2018 Target Resolution Date: 05/31/2018 Goal Status: Active Interventions: Assess HgA1c results as ordered upon admission and as needed Assess patient nutrition upon admission and as needed per policy Notes: ` Orientation to the Wound Care Program Nursing Diagnoses: Knowledge deficit related to the wound healing center program Goals: Patient/caregiver will verbalize understanding of the [place] KERNEY, HOPFENSPERGER ([id number]) Date Initiated: 04/19/2018 Target Resolution Date: 06/29/2018 Goal Status: Active Interventions: Provide education on orientation to the wound center Notes: ` Wound/Skin Impairment Nursing Diagnoses: Impaired tissue integrity Goals: Ulcer/skin breakdown will heal within 14 weeks Date Initiated: 04/19/2018 Target Resolution Date: 06/29/2018 Goal Status: Active Interventions: Assess patient/caregiver ability to obtain necessary supplies Assess patient/caregiver ability to perform ulcer/skin care regimen upon admission and as needed Assess ulceration(s) every visit Notes: Electronic Signature(s) Signed: 07/26/2018 6:17:48 PM By: Gretta Cool, BSN, RN, CWS, Kim RN, BSN Entered By: Gretta Cool, BSN, RN, CWS, Kim on 07/23/2018 09:21:47 Mccole, Huntley Dec ([id number]) -------------------------------------------------------------------------------- Pain  Assessment Details Patient Name: Marcus Loll T. Date of Service: 07/23/2018 8:45 AM Medical Record Number: [id number] Patient Account Number: [id number] Date of Birth/Sex: [birth year] (67 y.o. M) Treating RN: Montey Hora Primary Care Reynol Arnone: BESS, Valetta Fuller Other Clinician: Referring Cleburn Maiolo: BESS, KATY Treating Kenlei Safi/Extender: STONE III, HOYT Weeks in Treatment: 13 Active Problems Location of Pain Severity and Description of Pain Patient Has Paino Yes Site Locations Pain Location: Pain in Ulcers With Dressing Change: Yes Pain Management and Medication Current Pain Management: Electronic Signature(s) Signed: 07/23/2018 5:54:46 PM By: Montey Hora Entered By: Montey Hora on 07/23/2018 08:55:54 Wuest, Huntley Dec ([id number]) -------------------------------------------------------------------------------- Wound Assessment Details Patient Name: Marcus Loll T. Date of Service: 07/23/2018 8:45 AM Medical Record Number: [id number] Patient Account Number: [id number] Date of Birth/Sex: November 22, 1951 (67 y.o. M) Treating RN: Secundino Ginger Primary Care Aveline Daus: BESS, Valetta Fuller Other Clinician: Referring Jermario Kalmar: BESS, KATY Treating Evelen Vazguez/Extender: STONE III, HOYT Weeks in Treatment: 13 Wound Status Wound  Number: 2 Primary Diabetic Wound/Ulcer of the Lower Extremity Etiology: Wound Location: Left Toe Great Secondary Trauma, Other Wounding Event: Trauma Etiology: Date Acquired: 12/25/2017 Wound Open Weeks Of Treatment: 13 Status: Clustered Wound: No Comorbid Lymphedema, Arrhythmia, Coronary Artery Pending Amputation On Presentation History: Disease, Hypertension, Myocardial Infarction, Type II Diabetes, Neuropathy, Received Chemotherapy, Received Radiation Photos Photo Uploaded By: Secundino Ginger on 07/23/2018 09:04:35 Wound Measurements Length: (cm) 0.1 Width: (cm) 0.2 Depth: (cm) 0.2 Area: (cm) 0.016 Volume: (cm) 0.003 % Reduction in Area: 66% % Reduction in Volume:  40% Epithelialization: Medium (34-66%) Tunneling: No Undermining: No Wound Description Classification: Grade 1 Foul Odor A Wound Margin: Flat and Intact Slough/Fibr Exudate Amount: Small Exudate Type: Serous Exudate Color: amber fter Cleansing: No ino Yes Wound Bed Granulation Amount: Large (67-100%) Exposed Structure Granulation Quality: Pink Fascia Exposed: No Necrotic Amount: Small (1-33%) Fat Layer (Subcutaneous Tissue) Exposed: Yes Necrotic Quality: Adherent Slough Tendon Exposed: No Muscle Exposed: No Joint Exposed: No Bone Exposed: No Periwound Skin Texture Carpenter, Julus T. ([id number]) Texture Color No Abnormalities Noted: No No Abnormalities Noted: No Callus: No Atrophie Blanche: No Crepitus: No Cyanosis: No Excoriation: No Ecchymosis: No Induration: No Erythema: No Rash: No Hemosiderin Staining: No Scarring: No Mottled: No Pallor: No Moisture Rubor: No No Abnormalities Noted: No Dry / Scaly: No Temperature / Pain Maceration: No Temperature: No Abnormality Tenderness on Palpation: Yes Wound Preparation Ulcer Cleansing: Rinsed/Irrigated with Saline Topical Anesthetic Applied: Other: lidocaine 4%, Electronic Signature(s) Signed: 07/26/2018 4:42:53 PM By: Secundino Ginger Entered By: Secundino Ginger on 07/23/2018 09:00:42 Corum, Huntley Dec ([id number]) -------------------------------------------------------------------------------- [place] Details Patient Name: [name] Date of Service: 07/23/2018 8:45 AM Medical Record Number: [id number] Patient Account Number: [id number] Date of Birth/Sex: 18-Feb-1951 (67 y.o. M) Treating RN: Montey Hora Primary Care Cortavious Nix: BESS, KATY Other Clinician: Referring Jonell Brumbaugh: BESS, KATY Treating Kelcy Baeten/Extender: STONE III, HOYT Weeks in Treatment: 13 Vital Signs Time Taken: 08:56 Temperature (F): 97.6 Height (in): 69 Pulse (bpm): 74 Weight (lbs): 238 Respiratory Rate (breaths/min): 16 Body Mass Index (BMI):  35.1 Blood Pressure (mmHg): 114/64 Reference Range: 80 - 120 mg / dl Electronic Signature(s) Signed: 07/23/2018 5:54:46 PM By: Montey Hora Entered By: Montey Hora on 07/23/2018 08:56:25

## 2018-08-05 DIAGNOSIS — E11621 Type 2 diabetes mellitus with foot ulcer: Secondary | ICD-10-CM | POA: Diagnosis not present

## 2018-08-07 NOTE — Progress Notes (Signed)
Marcus, [name] ([id number]) Visit Report for 07/23/2018 Chief Complaint Document Details Patient Name: [name], [name]. Date of Service: 07/23/2018 8:45 AM Medical Record Number: [id number] Patient Account Number: [id number] Date of Birth/Sex: Oct 03, 1951 (67 y.o. M) Treating RN: Ahmed Prima Primary Care Provider: BESS, Valetta Fuller Other Clinician: Referring Provider: BESS, KATY Treating Provider/Extender: STONE III, HOYT Weeks in Treatment: 13 Information Obtained from: Patient Chief Complaint Left lower leg and left great toe ulcer Electronic Signature(s) Signed: 07/23/2018 9:24:47 PM By: Worthy Keeler PA-C Entered By: Worthy Keeler on 07/23/2018 08:40:57 Neto, Huntley Dec ([id number]) -------------------------------------------------------------------------------- HPI Details Patient Name: [name]. Date of Service: 07/23/2018 8:45 AM Medical Record Number: [id number] Patient Account Number: [id number] Date of Birth/Sex: 03/07/1951 (67 y.o. M) Treating RN: Ahmed Prima Primary Care Provider: BESS, KATY Other Clinician: Referring Provider: BESS, KATY Treating Provider/Extender: STONE III, HOYT Weeks in Treatment: 13 History of Present Illness Associated Signs and Symptoms: Patient has a history of chronic venous insufficiency, diabetes mellitus type II, its reform fraction, hypertension HPI Description: 04/19/18 on evaluation today patient presents initially concerning an ulcer of his left great toe and left anterior shin. He states that the shin was definitely a trauma injury where he struck this on a metal pole injuring leg. In regard to the distal left great toe he's really not sure what happened here although he does have a significant callous noted as well. He does have a pacemaker. Fortunately his ABI was 0.98 on the right and 0.83 on the left this appears to be doing fairly well. Overall I'm pleased with that. Nonetheless he states that this really has not seem to be  improving. He has been tolerating the dressing changes without complication. The left anterior leg ulcer began on 04/05/18 according to what he tells [place] today although one note I did have for review said it was April 5. I'm inclined to believe it was the fifth she did have an office visit on 04/01/18 with his family nurse practitioner Faith Rogue. Nonetheless either way it's been going on this month for several weeks. The left great toe has actually been present since January 1 he does not know how this occurred. Fortunately has no pain at the site although he does have pain on the left anterior shin. His hemoglobin A1c is 8.0 in March 2019. 04/26/18 on evaluation today patient presents for follow-up concerning his life into your [place] also as well as his left great toe also. Fortunately the shin actually appears to be doing better on evaluation today in my opinion I'm not seeing as much in the way of fluffernutter on the surface although it does still require debridement today this is improved. Size wise it's really not much different. With that being said the total ulcer actually appears to be a little bit more macerated today I'm not sure why they state that several days ago when this was changed that was not the case. Fortunately is not having severe pain although both areas do hurt the shin is worse. 05/03/18 on evaluation today patient appears to be doing better in regard to his left first toe and left shin ulcers. He is been tolerating the dressing changes without complication and the good news is this seems to be showing signs of getting better. Overall I'm very pleased with the progress that has been made over the past week. Patient is still having pain especially in regard to the left shin. 05/10/18 on evaluation today patient appears to be doing  rather well in regard to his two ulcers. The toe ulcer appears to be smaller he does have some undermining however in this had to be cleaned  out just a little bit. With that being said overall I feel like this is showing signs of improvement. The left anterior shin ulcer also showing signs of improvement at this time. There does not appear to be any evidence of infection which is good news. 05/21/18 on evaluation today patient appears to be doing very well in regard to his left shin ulcer. He has been tolerating the dressing changes without complication. With that being said he is having issues at this point with his great toe on the left. We have not x-rayed that at this point I think we may need to. Nonetheless he has pain really at the roughly 5 o'clock location but nowhere else and I really cannot explain this I do not see any obvious form body. Nonetheless he continues to have issues with getting this area to close it's not progressing as nicely. 05/28/18 on evaluation today patient appears to be doing very well in regard to his left lower extremity anterior ulcer. He has been tolerating the dressing changes without complication. Overall this is making excellent progress with the collagen dressing. His left great toe x-ray did return and showed that he had no if you that normality. He did have a plantar heel spur but this is more degenerative. Otherwise just arthritis was noted no osteomyelitis and no obvious form body was visualized. Overall things seem to be going fairly well. With that being said he still has pain when looking at the toe from the plantar aspect at roughly the six back to the 4 o'clock location which is not quite as bad as last time but still hurts more than any other part of his wound. It does appear the Iodoflex has been of benefit however. 06/04/18 on evaluation today patient appears to be doing excellent in regard to his left anterior shin ulcer. This has made excellent progress and is showing signs of improving in fact very close to completely closing in healing. Nonetheless unfortunately his left great toe has  not faired quite as well as far as progress is concerned. He notes that after last week's Simoneaux, [name]. ([id number]) debridement he actually seem to be doing a little bit better for a couple of days until Thursday of last week when he had increases in discomfort as well is changes in the draining. He states that the drainage has been more green in color according to his wife and what he has seen. He's also had more discomfort. He relates to me again that he really feels like this all started when he stepped on a nail although he does not exactly remember it it seemed to be a puncture wound that he feels may be the culprit for why all this started. Nonetheless I am getting more concerned about the possibility of osteomyelitis despite the fact that the x-ray was negative I was hopeful in this interim since last week to this week that we would see some improvement in the overall appearance of the toe. Unfortunately if anything I feel like it's a little worse. It was macerated but I think we can attribute this to the fact that the patient did have an episode last night we got stuck in the rain and he thinks the dressing got wet. He did not change it I told him in the future if this were to  happen to please go ahead and change the dressing it won't hurt to change it more frequently in that situation. Otherwise other than the maceration I do not see any evidence of anything being any worse but it also does not appear to be any better. 06/11/18 on evaluation today patient's wounds both the great toe as well as the left shin both on the left side show signs of good improvement. Fortunately the patient's culture came back negative in regard to the toe and there does not appear to be any evidence of infection. His MRI is actually scheduled for 06/19/18. Fortunately this is right before I see him next week and then we can go to the results of the culture at that point. Patient is in agreement with that plan.  With that being said otherwise he has been showing signs of having less pain in regard to the toe which is also good news my hope is that he does not have osteomyelitis. 06/18/18 on evaluation today patient appears to be doing very well in regard to his left lower extremity ulcer. He also states at this point in time that he has been tolerating the dressing changes with the toe it appears also to be doing very well at this point. In general I'm happy with the progress that he seems to be making. With that being said he still continues to have pain in regard to the great toe ulcer fortunately this does not seem to be as significant as it was in the beginning but still nonetheless he is having some pain. No fevers, chills, nausea, or vomiting noted at this time. 06/25/18 on evaluation today patient actually appears to be doing very well in regard to his left anterior shin ulcer. He's been tolerating the dressing changes without complication in this seems to have done extremely well. In fact this appears to be healed. With that being said his toe ulcer still continues to get in trouble fortunately not as much as has been in the past and there have been some signs of improvement and better granulation although I still think he's getting a lot of callous to the area which seems to indicate a lot of pressure and friction. I believe he may benefit from a total contact cast especially now that the left shin ulcer has healed. 07/02/18 on evaluation today patient appears to be doing rather well in regard to his left anterior shin ulcer which is actually completely healed at this point. With that being said he has left great toe ulcer actually appears to be doing better he states over the weekend he actually had a significant amount of drainage from the toe after it had been hurting for some time. He states when his wife change the dressing there was a lot of discharge and fluid he actually took a picture he showed  me today. Since that time he has not noted as much tenderness he feels like something may have "popped out" 07/09/18 on evaluation today patient appears to be doing about the same in regard to his toe ulcer. He really has not shown any signs of significant improvement this seems to just be maintaining at best. He continues to be extremely active in fact I think this may be the biggest issue that he continues to do things as normal in fact right now he's remodeling the bathroom. Obviously I think he is a very active individual and as previously discussed I think a total contact cast would be of great  benefit for him. This was discussed with patient yet again today I think we are gonna proceed in this regard. 07/12/18 patient presents today for his first total contact cast change. He has had this on for three days and states that though he felt like it may be rubbing a little bit around his ankles there does not appear to be any injury at this point. Overall the wound actually looks better in my pinion. 07/16/18 on evaluation today patient's wound actually appears to be doing much better at this point. He has been tolerating the dressing changes without complication. With that being said I do believe the total contact cast has been of benefit he states he is having much less discomfort that he previously had. Overall I'm very pleased with how things have progressed in just one weeks worth of the total contact cast. 07/23/18 on evaluation today patient's toe ulcer on the left great toe actually appears to be showing signs of improvement which is good news. In general I'm very pleased with the progress that he has made in the cast over the past two weeks. Fortunately there does not appear to be any evidence of infection at this time. He also is not having as much maceration as was previously noted. The patient is happy in this regard that he notes that the boot and cast is not the most comfortable thing in  the world. AVENIR, LOZINSKI ([id number]) Electronic Signature(s) Signed: 07/23/2018 9:24:47 PM By: Worthy Keeler PA-C Entered By: Worthy Keeler on 07/23/2018 10:29:54 Pittman, Huntley Dec ([id number]) -------------------------------------------------------------------------------- Physical Exam Details Patient Name: [name]. Date of Service: 07/23/2018 8:45 AM Medical Record Number: [id number] Patient Account Number: [id number] Date of Birth/Sex: 17-Mar-1951 (67 y.o. M) Treating RN: Ahmed Prima Primary Care Provider: BESS, KATY Other Clinician: Referring Provider: BESS, KATY Treating Provider/Extender: STONE III, HOYT Weeks in Treatment: 31 Constitutional Well-nourished and well-hydrated in no acute distress. Respiratory normal breathing without difficulty. clear to auscultation bilaterally. Cardiovascular regular rate and rhythm with normal S1, S2. Psychiatric this patient is able to make decisions and demonstrates good insight into disease process. Alert and Oriented x 3. pleasant and cooperative. Notes Patient's wound bed actually show no callous buildup which was good news. He did have some Slough noted in the base of the wound which was gently debrided away with saline and gauze along with the sterile Q-tip. In general the wound bed appears to be doing excellent and I'm very happy in this regard. Electronic Signature(s) Signed: 07/23/2018 9:24:47 PM By: Worthy Keeler PA-C Entered By: Worthy Keeler on 07/23/2018 10:30:48 Palladino, Huntley Dec ([id number]) -------------------------------------------------------------------------------- Physician Orders Details Patient Name: [name]. Date of Service: 07/23/2018 8:45 AM Medical Record Number: [id number] Patient Account Number: [id number] Date of Birth/Sex: 1951-06-28 (67 y.o. M) Treating RN: Cornell Barman Primary Care Provider: BESS, KATY Other Clinician: Referring Provider: BESS, KATY Treating Provider/Extender:  Melburn Hake, HOYT Weeks in Treatment: 13 Verbal / Phone Orders: No Diagnosis Coding ICD-10 Coding Code Description I87.2 Venous insufficiency (chronic) (peripheral) S81.812A Laceration without foreign body, left lower leg, initial encounter L97.822 Non-pressure chronic ulcer of other part of left lower leg with fat layer exposed L97.521 Non-pressure chronic ulcer of other part of left foot limited to breakdown of skin E11.621 Type 2 diabetes mellitus with foot ulcer I63.9 Cerebral infarction, unspecified I10 Essential (primary) hypertension Wound Cleansing Wound #2 Left Toe Great o Clean wound with Normal Saline. o Cleanse wound with mild soap and water o  No tub bath. Anesthetic (add to Medication List) Wound #2 Left Toe Great o Topical Lidocaine 4% cream applied to wound bed prior to debridement (In Clinic Only). Primary Wound Dressing Wound #2 Left Toe Great o Other: - Endoform Secondary Dressing Wound #2 Left Toe Great o Foam o Drawtex Dressing Change Frequency Wound #2 Left Toe Great o Change dressing every week Follow-up Appointments Wound #2 Left Toe Great o Return Appointment in 1 week. o Nurse Visit as needed Off-Loading Wound #2 Left Toe LABARON, DIGIROLAMO ([id number]) o Total Contact Cast to Left Lower Extremity o Other: - Keep as much pressure off of toe as you can. Additional Orders / Instructions Wound #2 Left Toe Great o Stop Smoking o Vitamin A; Vitamin C, Zinc - Please add a multivitamin with 100% of these o Increase protein intake. o Activity as tolerated Electronic Signature(s) Signed: 07/23/2018 9:24:47 PM By: Worthy Keeler PA-C Signed: 07/26/2018 6:17:48 PM By: Gretta Cool, BSN, RN, CWS, Kim RN, BSN Entered By: Gretta Cool, BSN, RN, CWS, Kim on 07/23/2018 88:91:69 [name] ([id number]) -------------------------------------------------------------------------------- Problem List Details Patient Name: MCDANIEL, OHMS. Date of Service: 07/23/2018 8:45 AM Medical Record Number: [id number] Patient Account Number: [id number] Date of Birth/Sex: 1951/11/03 (67 y.o. M) Treating RN: Ahmed Prima Primary Care Provider: BESS, KATY Other Clinician: Referring Provider: BESS, KATY Treating Provider/Extender: Melburn Hake, HOYT Weeks in Treatment: 13 Active Problems ICD-10 Evaluated Encounter Code Description Active Date Today Diagnosis I87.2 Venous insufficiency (chronic) (peripheral) 04/19/2018 No Yes S81.812A Laceration without foreign body, left lower leg, initial 04/19/2018 No Yes encounter L97.822 Non-pressure chronic ulcer of other part of left lower leg with 04/19/2018 No Yes fat layer exposed L97.521 Non-pressure chronic ulcer of other part of left foot limited to 04/19/2018 No Yes breakdown of skin E11.621 Type 2 diabetes mellitus with foot ulcer 04/19/2018 No Yes I63.9 Cerebral infarction, unspecified 04/19/2018 No Yes I10 Essential (primary) hypertension 04/19/2018 No Yes Inactive Problems Resolved Problems Electronic Signature(s) Signed: 07/23/2018 9:24:47 PM By: Worthy Keeler PA-C Entered By: Worthy Keeler on 07/23/2018 08:40:34 Viner, Huntley Dec ([id number]) -------------------------------------------------------------------------------- Progress Note Details Patient Name: [name] Date of Service: 07/23/2018 8:45 AM Medical Record Number: [id number] Patient Account Number: [id number] Date of Birth/Sex: 09/21/1951 (67 y.o. M) Treating RN: Ahmed Prima Primary Care Provider: BESS, KATY Other Clinician: Referring Provider: BESS, KATY Treating Provider/Extender: STONE III, HOYT Weeks in Treatment: 13 Subjective Chief Complaint Information obtained from Patient Left lower leg and left great toe ulcer History of Present Illness (HPI) The following HPI elements were documented for the patient's wound: Associated Signs and Symptoms: Patient has a history of chronic venous  insufficiency, diabetes mellitus type II, its reform fraction, hypertension 04/19/18 on evaluation today patient presents initially concerning an ulcer of his left great toe and left anterior shin. He states that the shin was definitely a trauma injury where he struck this on a metal pole injuring leg. In regard to the distal left great toe he's really not sure what happened here although he does have a significant callous noted as well. He does have a pacemaker. Fortunately his ABI was 0.98 on the right and 0.83 on the left this appears to be doing fairly well. Overall I'm pleased with that. Nonetheless he states that this really has not seem to be improving. He has been tolerating the dressing changes without complication. The left anterior leg ulcer began on 04/05/18 according to what he tells [place] today although one note I did  have for review said it was April 5. I'm inclined to believe it was the fifth she did have an office visit on 04/01/18 with his family nurse practitioner Faith Rogue. Nonetheless either way it's been going on this month for several weeks. The left great toe has actually been present since January 1 he does not know how this occurred. Fortunately has no pain at the site although he does have pain on the left anterior shin. His hemoglobin A1c is 8.0 in March 2019. 04/26/18 on evaluation today patient presents for follow-up concerning his life into your [place] also as well as his left great toe also. Fortunately the shin actually appears to be doing better on evaluation today in my opinion I'm not seeing as much in the way of fluffernutter on the surface although it does still require debridement today this is improved. Size wise it's really not much different. With that being said the total ulcer actually appears to be a little bit more macerated today I'm not sure why they state that several days ago when this was changed that was not the case. Fortunately is not having  severe pain although both areas do hurt the shin is worse. 05/03/18 on evaluation today patient appears to be doing better in regard to his left first toe and left shin ulcers. He is been tolerating the dressing changes without complication and the good news is this seems to be showing signs of getting better. Overall I'm very pleased with the progress that has been made over the past week. Patient is still having pain especially in regard to the left shin. 05/10/18 on evaluation today patient appears to be doing rather well in regard to his two ulcers. The toe ulcer appears to be smaller he does have some undermining however in this had to be cleaned out just a little bit. With that being said overall I feel like this is showing signs of improvement. The left anterior shin ulcer also showing signs of improvement at this time. There does not appear to be any evidence of infection which is good news. 05/21/18 on evaluation today patient appears to be doing very well in regard to his left shin ulcer. He has been tolerating the dressing changes without complication. With that being said he is having issues at this point with his great toe on the left. We have not x-rayed that at this point I think we may need to. Nonetheless he has pain really at the roughly 5 o'clock location but nowhere else and I really cannot explain this I do not see any obvious form body. Nonetheless he continues to have issues with getting this area to close it's not progressing as nicely. 05/28/18 on evaluation today patient appears to be doing very well in regard to his left lower extremity anterior ulcer. He has been tolerating the dressing changes without complication. Overall this is making excellent progress with the collagen dressing. His left great toe x-ray did return and showed that he had no if you that normality. He did have a plantar heel spur Nairn, Kazuki T. ([id number]) but this is more degenerative. Otherwise just  arthritis was noted no osteomyelitis and no obvious form body was visualized. Overall things seem to be going fairly well. With that being said he still has pain when looking at the toe from the plantar aspect at roughly the six back to the 4 o'clock location which is not quite as bad as last time but still hurts more than  any other part of his wound. It does appear the Iodoflex has been of benefit however. 06/04/18 on evaluation today patient appears to be doing excellent in regard to his left anterior shin ulcer. This has made excellent progress and is showing signs of improving in fact very close to completely closing in healing. Nonetheless unfortunately his left great toe has not faired quite as well as far as progress is concerned. He notes that after last week's debridement he actually seem to be doing a little bit better for a couple of days until Thursday of last week when he had increases in discomfort as well is changes in the draining. He states that the drainage has been more green in color according to his wife and what he has seen. He's also had more discomfort. He relates to me again that he really feels like this all started when he stepped on a nail although he does not exactly remember it it seemed to be a puncture wound that he feels may be the culprit for why all this started. Nonetheless I am getting more concerned about the possibility of osteomyelitis despite the fact that the x-ray was negative I was hopeful in this interim since last week to this week that we would see some improvement in the overall appearance of the toe. Unfortunately if anything I feel like it's a little worse. It was macerated but I think we can attribute this to the fact that the patient did have an episode last night we got stuck in the rain and he thinks the dressing got wet. He did not change it I told him in the future if this were to happen to please go ahead and change the dressing it won't hurt to  change it more frequently in that situation. Otherwise other than the maceration I do not see any evidence of anything being any worse but it also does not appear to be any better. 06/11/18 on evaluation today patient's wounds both the great toe as well as the left shin both on the left side show signs of good improvement. Fortunately the patient's culture came back negative in regard to the toe and there does not appear to be any evidence of infection. His MRI is actually scheduled for 06/19/18. Fortunately this is right before I see him next week and then we can go to the results of the culture at that point. Patient is in agreement with that plan. With that being said otherwise he has been showing signs of having less pain in regard to the toe which is also good news my hope is that he does not have osteomyelitis. 06/18/18 on evaluation today patient appears to be doing very well in regard to his left lower extremity ulcer. He also states at this point in time that he has been tolerating the dressing changes with the toe it appears also to be doing very well at this point. In general I'm happy with the progress that he seems to be making. With that being said he still continues to have pain in regard to the great toe ulcer fortunately this does not seem to be as significant as it was in the beginning but still nonetheless he is having some pain. No fevers, chills, nausea, or vomiting noted at this time. 06/25/18 on evaluation today patient actually appears to be doing very well in regard to his left anterior shin ulcer. He's been tolerating the dressing changes without complication in this seems to have done extremely  well. In fact this appears to be healed. With that being said his toe ulcer still continues to get in trouble fortunately not as much as has been in the past and there have been some signs of improvement and better granulation although I still think he's getting a lot of callous to the  area which seems to indicate a lot of pressure and friction. I believe he may benefit from a total contact cast especially now that the left shin ulcer has healed. 07/02/18 on evaluation today patient appears to be doing rather well in regard to his left anterior shin ulcer which is actually completely healed at this point. With that being said he has left great toe ulcer actually appears to be doing better he states over the weekend he actually had a significant amount of drainage from the toe after it had been hurting for some time. He states when his wife change the dressing there was a lot of discharge and fluid he actually took a picture he showed me today. Since that time he has not noted as much tenderness he feels like something may have "popped out" 07/09/18 on evaluation today patient appears to be doing about the same in regard to his toe ulcer. He really has not shown any signs of significant improvement this seems to just be maintaining at best. He continues to be extremely active in fact I think this may be the biggest issue that he continues to do things as normal in fact right now he's remodeling the bathroom. Obviously I think he is a very active individual and as previously discussed I think a total contact cast would be of great benefit for him. This was discussed with patient yet again today I think we are gonna proceed in this regard. 07/12/18 patient presents today for his first total contact cast change. He has had this on for three days and states that though he felt like it may be rubbing a little bit around his ankles there does not appear to be any injury at this point. Overall the wound actually looks better in my pinion. 07/16/18 on evaluation today patient's wound actually appears to be doing much better at this point. He has been tolerating the dressing changes without complication. With that being said I do believe the total contact cast has been of benefit he states he  is having much less discomfort that he previously had. Overall I'm very pleased with how things have progressed in just one weeks worth of the total contact cast. Rys, Huntley Dec ([id number]) 07/23/18 on evaluation today patient's toe ulcer on the left great toe actually appears to be showing signs of improvement which is good news. In general I'm very pleased with the progress that he has made in the cast over the past two weeks. Fortunately there does not appear to be any evidence of infection at this time. He also is not having as much maceration as was previously noted. The patient is happy in this regard that he notes that the boot and cast is not the most comfortable thing in the world. Patient History Information obtained from Patient. Family History Cancer - Father, Diabetes - Father, Heart Disease - Mother,Father, Hypertension - Mother, Kidney Disease - Child, No family history of Lung Disease, Seizures, Stroke, Thyroid Problems, Tuberculosis. Social History Current every day smoker - 1 - 1/2 pack daily, Marital Status - Married, Alcohol Use - Rarely, Drug Use - No History, Caffeine Use - Daily - coffee. Review  of Systems (ROS) Constitutional Symptoms (General Health) Denies complaints or symptoms of Fever, Chills. Respiratory The patient has no complaints or symptoms. Cardiovascular The patient has no complaints or symptoms. Psychiatric The patient has no complaints or symptoms. Objective Constitutional Well-nourished and well-hydrated in no acute distress. Vitals Time Taken: 8:56 AM, Height: 69 in, Weight: 238 lbs, BMI: 35.1, Temperature: 97.6 F, Pulse: 74 bpm, Respiratory Rate: 16 breaths/min, Blood Pressure: 114/64 mmHg. Respiratory normal breathing without difficulty. clear to auscultation bilaterally. Cardiovascular regular rate and rhythm with normal S1, S2. Psychiatric this patient is able to make decisions and demonstrates good insight into disease process.  Alert and Oriented x 3. pleasant and cooperative. General Notes: Patient's wound bed actually show no callous buildup which was good news. He did have some Slough noted in the base of the wound which was gently debrided away with saline and gauze along with the sterile Q-tip. In general the Sundquist, ELBERT SPICKLER. ([id number]) wound bed appears to be doing excellent and I'm very happy in this regard. Integumentary (Hair, Skin) Wound #2 status is Open. Original cause of wound was Trauma. The wound is located on the Left Toe Great. The wound measures 0.1cm length x 0.2cm width x 0.2cm depth; 0.016cm^2 area and 0.003cm^3 volume. There is Fat Layer (Subcutaneous Tissue) Exposed exposed. There is no tunneling or undermining noted. There is a small amount of serous drainage noted. The wound margin is flat and intact. There is large (67-100%) pink granulation within the wound bed. There is a small (1-33%) amount of necrotic tissue within the wound bed including Adherent Slough. The periwound skin appearance did not exhibit: Callus, Crepitus, Excoriation, Induration, Rash, Scarring, Dry/Scaly, Maceration, Atrophie Blanche, Cyanosis, Ecchymosis, Hemosiderin Staining, Mottled, Pallor, Rubor, Erythema. Periwound temperature was noted as No Abnormality. The periwound has tenderness on palpation. 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 There was a Total Contact Cast Procedure by STONE III, HOYT E.,  PA-C. Post procedure Diagnosis Wound #2: Same as Pre-Procedure Plan Wound Cleansing: Wound #2 Left Toe Great: Clean wound with Normal Saline. Cleanse wound with mild soap and water No tub bath. Anesthetic (add to Medication List): Wound #2 Left Toe Great: Topical Lidocaine 4% cream applied to wound bed prior to debridement (In Clinic Only). Primary Wound Dressing: Wound #2 Left Toe Great: Other: NORIEL, GUTHRIE ([id number]) Secondary Dressing: Wound #2 Left Toe Great: Foam Drawtex Dressing Change Frequency: Wound #2 Left Toe Great: Change dressing every week Follow-up Appointments: Wound #2 Left Toe Great: Return Appointment in 1 week. Nurse Visit as needed Off-Loading: Wound #2 Left Toe Great: Total Contact Cast to Left Lower Extremity Other: - Keep as much pressure off of toe as you can. Additional Orders / Instructions: Wound #2 Left Toe Great: Stop Smoking Vitamin A; Vitamin C, Zinc - Please add a multivitamin with 100% of these Increase protein intake. Activity as tolerated I am going to recommend that we reinitiate the total contact cast since this seems to be beneficial for him and this was applied by myself again in the office today.  We will subsequently see were things stand at follow-up in one weeks time. Please see above for specific wound care orders. We will see patient for re-evaluation in 1 week(s) here in the clinic. If anything worsens or changes patient will contact our office for additional recommendations. Electronic Signature(s) Signed: 07/23/2018 9:24:47 PM By: Worthy Keeler PA-C Entered By: Worthy Keeler on 07/23/2018 10:31:27 Cassis, Huntley Dec ([id number]) -------------------------------------------------------------------------------- ROS/PFSH Details Patient Name: [name]. Date of Service: 07/23/2018 8:45 AM Medical Record Number: [id number] Patient Account Number: [id number] Date of Birth/Sex: Jan 10, 1951 (67 y.o.  M) Treating RN: Ahmed Prima Primary Care Provider: BESS, KATY Other Clinician: Referring Provider: BESS, KATY Treating Provider/Extender: STONE III, HOYT Weeks in Treatment: 13 Information Obtained From Patient Wound History Do you currently have one or more open woundso Yes How many open wounds do you currently haveo 2 Approximately how long have you had your woundso 2 weeks How have you been treating your wound(s) until nowo yes Doctor Has your wound(s) ever healed and then re-openedo No Have you had any lab work done in the past montho No Have you tested positive for an antibiotic resistant organism (MRSA, VRE)o No Have you tested positive for osteomyelitis (bone infection)o No Have you had any tests for circulation on your legso No Have you had other problems associated with your woundso Infection, Swelling Constitutional Symptoms (General Health) Complaints and Symptoms: Negative for: Fever; Chills Eyes Medical History: Negative for: Cataracts; Glaucoma; Optic Neuritis Ear/Nose/Mouth/Throat Medical History: Negative for: Chronic sinus problems/congestion Hematologic/Lymphatic Medical History: Positive for: Lymphedema Negative for: Anemia; Hemophilia; Human Immunodeficiency Virus; Sickle Cell Disease Respiratory Complaints and Symptoms: No Complaints or Symptoms Medical History: Negative for: Aspiration; Asthma; Chronic Obstructive Pulmonary Disease (COPD); Pneumothorax; Sleep Apnea; Tuberculosis Cardiovascular Complaints and Symptoms: No Complaints or Symptoms Medical History: ALPHONZO, DEVERA ([id number]) Positive for: Arrhythmia - pacemaker; Coronary Artery Disease; Hypertension; Myocardial Infarction Negative for: Deep Vein Thrombosis; Hypotension; Peripheral Arterial Disease; Peripheral Venous Disease; Phlebitis; Vasculitis Gastrointestinal Medical History: Negative for: Cirrhosis ; Colitis; Crohnos; Hepatitis A; Hepatitis B; Hepatitis  C Endocrine Medical History: Positive for: Type II Diabetes Negative for: Type I Diabetes Time with diabetes: 5 years Treated with: Oral agents Blood sugar tested every day: Yes Tested : daily AM Blood sugar testing results: Breakfast: 174 Genitourinary Medical History: Negative for: End Stage Renal Disease Immunological Medical History: Negative for: Lupus Erythematosus; Raynaudos; Scleroderma Integumentary (Skin) Medical History: Negative for: History of Burn; History of pressure wounds Musculoskeletal Medical History: Negative for: Gout; Rheumatoid Arthritis; Osteoarthritis; Osteomyelitis Neurologic Medical History: Positive for: Neuropathy Negative for: Dementia; Quadriplegia; Paraplegia; Seizure Disorder Oncologic Medical History: Positive for: Received Chemotherapy; Received Radiation - Seeds prostate Psychiatric Complaints and Symptoms: No Complaints or Symptoms Medical History: Negative for: Anorexia/bulimia; Confinement Anxiety Baldree, SHAKIR PETROSINO ([id number]) Immunizations Pneumococcal Vaccine: Received Pneumococcal Vaccination: Yes Tetanus Vaccine: Last tetanus shot: 12/25/2017 Implantable Devices Family and Social History Cancer: Yes - Father; Diabetes: Yes - Father; Heart Disease: Yes - Mother,Father; Hypertension: Yes - Mother; Kidney Disease: Yes - Child; Lung Disease: No; Seizures: No; Stroke: No; Thyroid Problems: No; Tuberculosis: No; Current every day smoker - 1 - 1/2 pack daily; Marital Status - Married; Alcohol Use: Rarely; Drug Use: No History; Caffeine Use: Daily - coffee; Financial Concerns: No; Food, Clothing or Shelter Needs: No; Support System Lacking: No; Transportation Concerns: No Physician Affirmation I have reviewed and agree with the above information. Electronic Signature(s) Signed: 07/23/2018 9:24:47 PM By: Worthy Keeler PA-C Signed: 07/29/2018 4:59:33 PM  By: Alric Quan Entered By: Worthy Keeler on 07/23/2018 10:30:21 Archibeque,  Huntley Dec ([id number]) -------------------------------------------------------------------------------- Total Contact Cast Details Patient Name: [name] Date of Service: 07/23/2018 8:45 AM Medical Record Number: [id number] Patient Account Number: [id number] Date of Birth/Sex: 1951-07-18 (67 y.o. M) Treating RN: Cornell Barman Primary Care Provider: BESS, KATY Other Clinician: Referring Provider: BESS, KATY Treating Provider/Extender: Melburn Hake, HOYT Weeks in Treatment: 13 Total Contact Cast Applied for Wound Assessment: Wound #2 Left Toe Great Performed By: Physician Emilio Math., PA-C Post Procedure Diagnosis Same as Pre-procedure Electronic Signature(s) Signed: 07/23/2018 9:24:47 PM By: Worthy Keeler PA-C Signed: 07/26/2018 6:17:48 PM By: Gretta Cool, BSN, RN, CWS, Kim RN, BSN Entered By: Gretta Cool, BSN, RN, CWS, Kim on 07/23/2018 09:24:02 [name] ([id number]) -------------------------------------------------------------------------------- SuperBill Details Patient Name: [name]. Date of Service: 07/23/2018 Medical Record Number: [id number] Patient Account Number: [id number] Date of Birth/Sex: [birth year] (67 y.o. M) Treating RN: Cornell Barman Primary Care Provider: BESS, KATY Other Clinician: Referring Provider: BESS, KATY Treating Provider/Extender: Melburn Hake, HOYT Weeks in Treatment: 13 Diagnosis Coding ICD-10 Codes Code Description I87.2 Venous insufficiency (chronic) (peripheral) S81.812A Laceration without foreign body, left lower leg, initial encounter L97.822 Non-pressure chronic ulcer of other part of left lower leg with fat layer exposed L97.521 Non-pressure chronic ulcer of other part of left foot limited to breakdown of skin E11.621 Type 2 diabetes mellitus with foot ulcer I63.9 Cerebral infarction, unspecified I10 Essential (primary) hypertension Facility Procedures CPT4 Code Description: 28003491 29445 - APPLY TOTAL CONTACT LEG CAST ICD-10 Diagnosis  Description L97.822 Non-pressure chronic ulcer of other part of left lower leg with Modifier: fat layer expos Quantity: 1 ed Physician Procedures CPT4 Code Description: [id number] - WC PHYS LEVEL 3 - EST PT ICD-10 Diagnosis Description I87.2 Venous insufficiency (chronic) (peripheral) A16.553Z Laceration without foreign body, left lower leg, initial encounter L97.822 Non-pressure chronic ulcer  of other part of left lower leg with fat L97.521 Non-pressure chronic ulcer of other part of left foot limited to br Modifier: 25 layer expose [place] of sk Quantity: 1 d in CPT4 Code Description: [id number] 29445 - WC PHYS APPLY TOTAL CONTACT CAST ICD-10 Diagnosis Description L97.822 Non-pressure chronic ulcer of other part of left lower leg with fat Modifier: layer expose Quantity: 1 d Electronic Signature(s) Signed: 07/23/2018 9:24:47 PM By: Worthy Keeler PA-C Entered By: Worthy Keeler on 07/23/2018 10:32:30

## 2018-08-09 ENCOUNTER — Telehealth: Payer: Self-pay | Admitting: Cardiovascular Disease

## 2018-08-09 NOTE — Progress Notes (Signed)
WESS, BANEY ([id number]) Visit Report for 07/30/2018 Arrival Information Details Patient Name: [name], [name]. Date of Service: 07/30/2018 8:45 AM Medical Record Number: [id number] Patient Account Number: [id number] Date of Birth/Sex: 1951/12/14 (67 y.o. M) Treating RN: Montey Hora Primary Care Istvan Behar: BESS, KATY Other Clinician: Referring Martrell Eguia: BESS, KATY Treating Joanny Dupree/Extender: Cathie Olden in Treatment: 14 Visit Information History Since Last Visit Added or deleted any medications: No Patient Arrived: Ambulatory Any new allergies or adverse reactions: No Arrival Time: 09:05 Had a fall or experienced change in No Accompanied By: spouse activities of daily living that may affect Transfer Assistance: None risk of falls: Patient Identification Verified: Yes Signs or symptoms of abuse/neglect since last visito No Secondary Verification Process Completed: Yes Hospitalized since last visit: No Patient Has Alerts: Yes Implantable device outside of the clinic excluding No Patient Alerts: Type II Diabetic cellular tissue based products placed in the center since last visit: Has Dressing in Place as Prescribed: Yes Pain Present Now: No Electronic Signature(s) Signed: 07/30/2018 4:05:36 PM By: Montey Hora Entered By: Montey Hora on 07/30/2018 09:05:31 [name], [name] ([id number]) -------------------------------------------------------------------------------- Encounter Discharge Information Details Patient Name: Marcus Loll T. Date of Service: 07/30/2018 8:45 AM Medical Record Number: [id number] Patient Account Number: [id number] Date of Birth/Sex: [birth year] (67 y.o. M) Treating RN: Roger Shelter Primary Care Jidenna Figgs: BESS, Valetta Fuller Other Clinician: Referring Heraclio Seidman: BESS, KATY Treating Tyjai Matuszak/Extender: Cathie Olden in Treatment: 14 Encounter Discharge Information Items Discharge Condition: Stable Ambulatory Status: Ambulatory Discharge  Destination: Home Transportation: Private Auto Schedule Follow-up Appointment: Yes Clinical Summary of Care: Electronic Signature(s) Signed: 07/30/2018 3:57:43 PM By: Roger Shelter Entered By: Roger Shelter on 07/30/2018 09:51:52 [name], [name] ([id number]) -------------------------------------------------------------------------------- Lower Extremity Assessment Details Patient Name: Marcus Loll T. Date of Service: 07/30/2018 8:45 AM Medical Record Number: [id number] Patient Account Number: [id number] Date of Birth/Sex: May 17, 1951 (67 y.o. M) Treating RN: Montey Hora Primary Care Heather Streeper: BESS, KATY Other Clinician: Referring Anakin Varkey: BESS, KATY Treating Avalon Coppinger/Extender: Cathie Olden in Treatment: 14 Edema Assessment Assessed: [Left: No] [Right: No] [Left: Edema] [Right: :] Calf Left: Right: Point of Measurement: 36 cm From Medial Instep 38.3 cm cm Ankle Left: Right: Point of Measurement: 11 cm From Medial Instep 23.8 cm cm Vascular Assessment Pulses: Dorsalis Pedis Palpable: [Left:Yes] Posterior Tibial Extremity colors, hair growth, and conditions: Extremity Color: [Left:Hyperpigmented] Hair Growth on Extremity: [Left:Yes] Temperature of Extremity: [Left:Warm] Capillary Refill: [Left:< 3 seconds] Toe Nail Assessment Left: Right: Thick: Yes Discolored: Yes Deformed: Yes Improper Length and Hygiene: Yes Electronic Signature(s) Signed: 07/30/2018 4:05:36 PM By: Montey Hora Entered By: Montey Hora on 07/30/2018 09:24:07 [name], [name] ([id number]) -------------------------------------------------------------------------------- Multi Wound Chart Details Patient Name: Marcus Loll T. Date of Service: 07/30/2018 8:45 AM Medical Record Number: [id number] Patient Account Number: [id number] Date of Birth/Sex: [birth year] (67 y.o. M) Treating RN: Ahmed Prima Primary Care Jarman Litton: BESS, KATY Other Clinician: Referring Sanjna Haskew: BESS,  KATY Treating Onix Jumper/Extender: Cathie Olden in Treatment: 14 Vital Signs Height(in): 55 Pulse(bpm): 78 Weight(lbs): 238 Blood Pressure(mmHg): 110/63 Body Mass Index(BMI): 35 Temperature(F): 97.5 Respiratory Rate 16 (breaths/min): Photos: [2:No Photos] [3:No Photos] [N/A:N/A] Wound Location: [2:Left Toe Great] [3:Left Foot - Dorsal] [N/A:N/A] Wounding Event: [2:Trauma] [3:Gradually Appeared] [N/A:N/A] Primary Etiology: [2:Diabetic Wound/Ulcer of the Lower Extremity] [3:Diabetic Wound/Ulcer of the Lower Extremity] [N/A:N/A] Secondary Etiology: [2:Trauma, Other] [3:N/A] [N/A:N/A] Comorbid History: [2:Lymphedema, Arrhythmia, Coronary Artery Disease, Hypertension, Myocardial Infarction, Type II Diabetes, Neuropathy, Received Chemotherapy, Received Radiation] [3:Lymphedema, Arrhythmia, Coronary Artery Disease, Hypertension,  Myocardial Infarction, Type II Diabetes,  Neuropathy, Received Chemotherapy, Received Radiation] [N/A:N/A] Date Acquired: [2:12/25/2017] [3:07/30/2018] [N/A:N/A] Weeks of Treatment: [2:14] [3:0] [N/A:N/A] Wound Status: [2:Open] [3:Open] [N/A:N/A] Pending Amputation on [2:Yes] [3:No] [N/A:N/A] Presentation: Measurements L x W x D [2:0.2x0.2x0.4] [3:0.5x0.4x0.1] [N/A:N/A] (cm) Area (cm) : [2:0.031] [3:0.157] [N/A:N/A] Volume (cm) : [2:0.013] [3:0.016] [N/A:N/A] % Reduction in Area: [2:34.00%] [3:N/A] [N/A:N/A] % Reduction in Volume: [2:-160.00%] [3:N/A] [N/A:N/A] Classification: [2:Grade 1] [3:Grade 1] [N/A:N/A] Exudate Amount: [2:Small] [3:Medium] [N/A:N/A] Exudate Type: [2:Serous] [3:Serous] [N/A:N/A] Exudate Color: [2:amber] [3:amber] [N/A:N/A] Wound Margin: [2:Flat and Intact] [3:Flat and Intact] [N/A:N/A] Granulation Amount: [2:Medium (34-66%)] [3:Large (67-100%)] [N/A:N/A] Granulation Quality: [2:Pink] [3:Pink] [N/A:N/A] Necrotic Amount: [2:Medium (34-66%)] [3:None Present (0%)] [N/A:N/A] Exposed Structures: [2:Fat Layer (Subcutaneous Tissue)  Exposed: Yes Fascia: No Tendon: No Muscle: No] [3:Fascia: No Fat Layer (Subcutaneous Tissue) Exposed: No Tendon: No Muscle: No] [N/A:N/A] Joint: No Joint: No Bone: No Bone: No Epithelialization: Medium (34-66%) None N/A Debridement: Debridement - Excisional N/A N/A Pre-procedure 09:32 N/A N/A Verification/Time Out Taken: Pain Control: Lidocaine 4% Topical Solution N/A N/A Tissue Debrided: Callus, Subcutaneous, Slough N/A N/A Level: Skin/Subcutaneous Tissue N/A N/A Debridement Area (sq cm): 0.04 N/A N/A Instrument: Curette, Forceps N/A N/A Bleeding: Minimum N/A N/A Hemostasis Achieved: Pressure N/A N/A Procedural Pain: 0 N/A N/A Post Procedural Pain: 0 N/A N/A Debridement Treatment Procedure was tolerated well N/A N/A Response: Post Debridement 0.3x0.3x0.4 N/A N/A Measurements L x W x D (cm) Post Debridement Volume: 0.028 N/A N/A (cm) Periwound Skin Texture: Excoriation: No Excoriation: No N/A Induration: No Induration: No Callus: No Callus: No Crepitus: No Crepitus: No Rash: No Rash: No Scarring: No Scarring: No Periwound Skin Moisture: Maceration: No Maceration: No N/A Dry/Scaly: No Dry/Scaly: No Periwound Skin Color: Atrophie Blanche: No Erythema: Yes N/A Cyanosis: No Atrophie Blanche: No Ecchymosis: No Cyanosis: No Erythema: No Ecchymosis: No Hemosiderin Staining: No Hemosiderin Staining: No Mottled: No Mottled: No Pallor: No Pallor: No Rubor: No Rubor: No Erythema Location: N/A Circumferential N/A Temperature: No Abnormality No Abnormality N/A Tenderness on Palpation: Yes No N/A Wound Preparation: Ulcer Cleansing: Ulcer Cleansing: N/A Rinsed/Irrigated with Saline, Rinsed/Irrigated with Saline, Other: soap and water Other: soap and water Topical Anesthetic Applied: Topical Anesthetic Applied: Other: lidocaine 4% Other: lidocaine 4% Procedures Performed: Debridement N/A N/A Treatment Notes Wound #2 (Left Toe Great) 1. Cleansed  with: Clean wound with Normal Saline 2. Anesthetic Topical Lidocaine 4% cream to wound bed prior to debridement 4. Dressing Applied: Other dressing (specify in notes) [name], Marcus T. ([id number]) Notes endoform, foam with cut out, gauze then wrap with conform wrap with tape Wound #3 (Left, Dorsal Foot) 1. Cleansed with: Clean wound with Normal Saline Notes coverlet Electronic Signature(s) Signed: 07/30/2018 10:14:06 AM By: Lawanda Cousins Entered By: Lawanda Cousins on 07/30/2018 10:14:06 [name], [name] ([id number]) -------------------------------------------------------------------------------- [place] Details Patient Name: Marcus Lark. Date of Service: 07/30/2018 8:45 AM Medical Record Number: [id number] Patient Account Number: [id number] Date of Birth/Sex: June 26, 1951 (67 y.o. M) Treating RN: Ahmed Prima Primary Care Ellena Kamen: BESS, Valetta Fuller Other Clinician: Referring Anubis Fundora: BESS, KATY Treating Shelitha Magley/Extender: Cathie Olden in Treatment: 14 Active Inactive ` Abuse / Safety / Falls / Self Care Management Nursing Diagnoses: Potential for falls Goals: Patient will remain injury free related to falls Date Initiated: 04/19/2018 Target Resolution Date: 05/04/2018 Goal Status: Active Interventions: Assess fall risk on admission and as needed Notes: ` Nutrition Nursing Diagnoses: Impaired glucose control: actual or potential Goals: Patient/caregiver agrees to and verbalizes understanding of need to use nutritional supplements and/or vitamins as prescribed  Date Initiated: 04/19/2018 Target Resolution Date: 06/07/2018 Goal Status: Active Patient/caregiver verbalizes understanding of need to maintain therapeutic glucose control per primary care physician Date Initiated: 04/19/2018 Target Resolution Date: 05/31/2018 Goal Status: Active Interventions: Assess HgA1c results as ordered upon admission and as needed Assess patient nutrition upon  admission and as needed per policy Notes: ` Orientation to the Wound Care Program Nursing Diagnoses: Knowledge deficit related to the wound healing center program Goals: Patient/caregiver will verbalize understanding of the [place] LAVIN, PETTEWAY ([id number]) Date Initiated: 04/19/2018 Target Resolution Date: 06/29/2018 Goal Status: Active Interventions: Provide education on orientation to the wound center Notes: ` Wound/Skin Impairment Nursing Diagnoses: Impaired tissue integrity Goals: Ulcer/skin breakdown will heal within 14 weeks Date Initiated: 04/19/2018 Target Resolution Date: 06/29/2018 Goal Status: Active Interventions: Assess patient/caregiver ability to obtain necessary supplies Assess patient/caregiver ability to perform ulcer/skin care regimen upon admission and as needed Assess ulceration(s) every visit Notes: Electronic Signature(s) Signed: 07/30/2018 3:52:40 PM By: Alric Quan Entered By: Alric Quan on 07/30/2018 09:30:34 [name], [name] ([id number]) -------------------------------------------------------------------------------- Pain Assessment Details Patient Name: Marcus Loll T. Date of Service: 07/30/2018 8:45 AM Medical Record Number: [id number] Patient Account Number: [id number] Date of Birth/Sex: 02-18-1951 (67 y.o. M) Treating RN: Montey Hora Primary Care Iliza Blankenbeckler: BESS, Valetta Fuller Other Clinician: Referring Tiwan Schnitker: BESS, KATY Treating Waymon Laser/Extender: Cathie Olden in Treatment: 14 Active Problems Location of Pain Severity and Description of Pain Patient Has Paino Yes Site Locations Pain Location: Generalized Pain With Dressing Change: No Duration of the Pain. Constant / Intermittento Constant Pain Management and Medication Current Pain Management: Goals for Pain Management states "my toenail hurts" Electronic Signature(s) Signed: 07/30/2018 4:05:36 PM By: Montey Hora Entered By: Montey Hora on  07/30/2018 09:05:56 [name], [name] ([id number]) -------------------------------------------------------------------------------- Patient/Caregiver Education Details Patient Name: Marcus Lark. Date of Service: 07/30/2018 8:45 AM Medical Record Number: [id number] Patient Account Number: [id number] Date of Birth/Gender: June 28, 1951 (67 y.o. M) Treating RN: Roger Shelter Primary Care Physician: BESS, Valetta Fuller Other Clinician: Referring Physician: Lillard Anes Treating Physician/Extender: Cathie Olden in Treatment: 14 Education Assessment Education Provided To: Patient Education Topics Provided Wound Debridement: Handouts: Wound Debridement Methods: Explain/Verbal Responses: State content correctly Wound/Skin Impairment: Handouts: Caring for Your Ulcer Methods: Explain/Verbal Responses: State content correctly Electronic Signature(s) Signed: 07/30/2018 3:57:43 PM By: Roger Shelter Entered By: Roger Shelter on 07/30/2018 09:52:10 [name], [name] ([id number]) -------------------------------------------------------------------------------- Wound Assessment Details Patient Name: Marcus Loll T. Date of Service: 07/30/2018 8:45 AM Medical Record Number: [id number] Patient Account Number: [id number] Date of Birth/Sex: 1951/05/14 (67 y.o. M) Treating RN: Montey Hora Primary Care Jarome Trull: BESS, KATY Other Clinician: Referring Creek Gan: BESS, KATY Treating Tarica Harl/Extender: Cathie Olden in Treatment: 14 Wound Status Wound Number: 2 Primary Diabetic Wound/Ulcer of the Lower Extremity Etiology: Wound Location: Left Toe Great Secondary Trauma, Other Wounding Event: Trauma Etiology: Date Acquired: 12/25/2017 Wound Open Weeks Of Treatment: 14 Status: Clustered Wound: No Comorbid Lymphedema, Arrhythmia, Coronary Artery Pending Amputation On Presentation History: Disease, Hypertension, Myocardial Infarction, Type II Diabetes, Neuropathy, Received Chemotherapy,  Received Radiation Photos Photo Uploaded By: Montey Hora on 07/30/2018 12:22:36 Wound Measurements Length: (cm) 0.2 Width: (cm) 0.2 Depth: (cm) 0.4 Area: (cm) 0.031 Volume: (cm) 0.013 % Reduction in Area: 34% % Reduction in Volume: -160% Epithelialization: Medium (34-66%) Tunneling: No Undermining: No Wound Description Classification: Grade 1 Wound Margin: Flat and Intact Exudate Amount: Small Exudate Type: Serous Exudate Color: amber Foul Odor After Cleansing: No Slough/Fibrino Yes Wound Bed Granulation Amount: Medium (34-66%) Exposed Structure  Granulation Quality: Pink Fascia Exposed: No Necrotic Amount: Medium (34-66%) Fat Layer (Subcutaneous Tissue) Exposed: Yes Necrotic Quality: Adherent Slough Tendon Exposed: No Muscle Exposed: No Joint Exposed: No [name], [name] ([id number]) Bone Exposed: No Periwound Skin Texture Texture Color No Abnormalities Noted: No No Abnormalities Noted: No Callus: No Atrophie Blanche: No Crepitus: No Cyanosis: No Excoriation: No Ecchymosis: No Induration: No Erythema: No Rash: No Hemosiderin Staining: No Scarring: No Mottled: No Pallor: No Moisture Rubor: No No Abnormalities Noted: No Dry / Scaly: No Temperature / Pain Maceration: No Temperature: No Abnormality Tenderness on Palpation: Yes Wound Preparation Ulcer Cleansing: Rinsed/Irrigated with Saline, Other: soap and water, Topical Anesthetic Applied: Other: lidocaine 4%, Treatment Notes Wound #2 (Left Toe Great) 1. Cleansed with: Clean wound with Normal Saline 2. Anesthetic Topical Lidocaine 4% cream to wound bed prior to debridement 4. Dressing Applied: Other dressing (specify in notes) Notes endoform, foam with cut out, gauze then wrap with conform wrap with tape Electronic Signature(s) Signed: 07/30/2018 4:05:36 PM By: Montey Hora Entered By: Montey Hora on 07/30/2018 09:21:20 [name], [name]  ([id number]) -------------------------------------------------------------------------------- Wound Assessment Details Patient Name: Marcus Loll T. Date of Service: 07/30/2018 8:45 AM Medical Record Number: [id number] Patient Account Number: [id number] Date of Birth/Sex: February 15, 1951 (67 y.o. M) Treating RN: Montey Hora Primary Care Mitzi Lilja: BESS, KATY Other Clinician: Referring Saifan Rayford: BESS, KATY Treating Daylyn Christine/Extender: Cathie Olden in Treatment: 14 Wound Status Wound Number: 3 Primary Diabetic Wound/Ulcer of the Lower Extremity Etiology: Wound Location: Left Foot - Dorsal Wound Open Wounding Event: Gradually Appeared Status: Date Acquired: 07/30/2018 Comorbid Lymphedema, Arrhythmia, Coronary Artery Weeks Of Treatment: 0 History: Disease, Hypertension, Myocardial Infarction, Clustered Wound: No Type II Diabetes, Neuropathy, Received Chemotherapy, Received Radiation Photos Photo Uploaded By: Montey Hora on 07/30/2018 12:22:38 Wound Measurements Length: (cm) 0.5 Width: (cm) 0.4 Depth: (cm) 0.1 Area: (cm) 0.157 Volume: (cm) 0.016 % Reduction in Area: % Reduction in Volume: Epithelialization: None Tunneling: No Undermining: No Wound Description Classification: Grade 1 Foul O Wound Margin: Flat and Intact Slough Exudate Amount: Medium Exudate Type: Serous Exudate Color: amber dor After Cleansing: No /Fibrino No Wound Bed Granulation Amount: Large (67-100%) Exposed Structure Granulation Quality: Pink Fascia Exposed: No Necrotic Amount: None Present (0%) Fat Layer (Subcutaneous Tissue) Exposed: No Tendon Exposed: No Muscle Exposed: No Joint Exposed: No Bone Exposed: No Even, Marcus T. ([id number]) Periwound Skin Texture Texture Color No Abnormalities Noted: No No Abnormalities Noted: No Callus: No Atrophie Blanche: No Crepitus: No Cyanosis: No Excoriation: No Ecchymosis: No Induration: No Erythema: Yes Rash: No Erythema Location:  Circumferential Scarring: No Hemosiderin Staining: No Mottled: No Moisture Pallor: No No Abnormalities Noted: No Rubor: No Dry / Scaly: No Maceration: No Temperature / Pain Temperature: No Abnormality Wound Preparation Ulcer Cleansing: Rinsed/Irrigated with Saline, Other: soap and water, Topical Anesthetic Applied: Other: lidocaine 4%, Treatment Notes Wound #3 (Left, Dorsal Foot) 1. Cleansed with: Clean wound with Normal Saline Notes coverlet Electronic Signature(s) Signed: 07/30/2018 4:05:36 PM By: Montey Hora Entered By: Montey Hora on 07/30/2018 09:22:51 [name], [name] ([id number]) -------------------------------------------------------------------------------- Vitals Details Patient Name: Marcus Lark. Date of Service: 07/30/2018 8:45 AM Medical Record Number: [id number] Patient Account Number: [id number] Date of Birth/Sex: 1951/01/11 (67 y.o. M) Treating RN: Montey Hora Primary Care Isiah Scheel: BESS, KATY Other Clinician: Referring Jansen Sciuto: BESS, KATY Treating Eliab Closson/Extender: Cathie Olden in Treatment: 14 Vital Signs Time Taken: 09:06 Temperature (F): 97.5 Height (in): 69 Pulse (bpm): 71 Weight (lbs): 238 Respiratory Rate (breaths/min): 16 Body Mass Index (BMI): 35.1 Blood  Pressure (mmHg): 110/63 Reference Range: 80 - 120 mg / dl Electronic Signature(s) Signed: 07/30/2018 4:05:36 PM By: Montey Hora Entered By: Montey Hora on 07/30/2018 09:09:31

## 2018-08-09 NOTE — Progress Notes (Signed)
KARDER, GOODIN ([id number]) Visit Report for 07/30/2018 Chief Complaint Document Details Patient Name: [name], [name]. Date of Service: 07/30/2018 8:45 AM Medical Record Number: [id number] Patient Account Number: [id number] Date of Birth/Sex: 31-[name]-1952 (67 y.o. M) Treating RN: Ahmed Prima Primary Care Provider: BESS, Valetta Fuller Other Clinician: Referring Provider: BESS, KATY Treating Provider/Extender: Cathie Olden in Treatment: 14 Information Obtained from: Patient Chief Complaint Left lower leg and left great toe ulcer Electronic Signature(s) Signed: 07/30/2018 10:14:56 AM By: Lawanda Cousins Entered By: Lawanda Cousins on 07/30/2018 10:14:55 Bazzle, [name] ([id number]) -------------------------------------------------------------------------------- Debridement Details Patient Name: [name]. Date of Service: 07/30/2018 8:45 AM Medical Record Number: [id number] Patient Account Number: [id number] Date of Birth/Sex: [birth year] (67 y.o. M) Treating RN: Ahmed Prima Primary Care Provider: BESS, KATY Other Clinician: Referring Provider: BESS, KATY Treating Provider/Extender: Cathie Olden in Treatment: 14 Debridement Performed for Wound #2 Left Toe Great Assessment: Performed By: Physician Lawanda Cousins, NP Debridement Type: Debridement Severity of Tissue Pre Fat layer exposed Debridement: Pre-procedure Verification/Time Yes - 09:32 Out Taken: Start Time: 09:32 Pain Control: Lidocaine 4% Topical Solution Total Area Debrided (L x W): 0.2 (cm) x 0.2 (cm) = 0.04 (cm) Tissue and other material Viable, Non-Viable, Callus, Slough, Subcutaneous, Fibrin/Exudate, Slough debrided: Level: Skin/Subcutaneous Tissue Debridement Description: Excisional Instrument: Curette, Forceps Bleeding: Minimum Hemostasis Achieved: Pressure End Time: 09:36 Procedural Pain: 0 Post Procedural Pain: 0 Response to Treatment: Procedure was tolerated well Level of Consciousness:  Awake and Alert Post Debridement Measurements of Total Wound Length: (cm) 0.3 Width: (cm) 0.3 Depth: (cm) 0.4 Volume: (cm) 0.028 Character of Wound/Ulcer Post Debridement: Requires Further Debridement Severity of Tissue Post Debridement: Fat layer exposed Post Procedure Diagnosis Same as Pre-procedure Electronic Signature(s) Signed: 07/30/2018 10:14:46 AM By: Lawanda Cousins Signed: 07/30/2018 3:52:40 PM By: Alric Quan Entered By: Lawanda Cousins on 07/30/2018 10:14:46 Delon, [name] ([id number]) -------------------------------------------------------------------------------- HPI Details Patient Name: Marcus Loll T. Date of Service: 07/30/2018 8:45 AM Medical Record Number: [id number] Patient Account Number: [id number] Date of Birth/Sex: 01-Apr-1951 (67 y.o. M) Treating RN: Ahmed Prima Primary Care Provider: BESS, Valetta Fuller Other Clinician: Referring Provider: BESS, KATY Treating Provider/Extender: Cathie Olden in Treatment: 14 History of Present Illness Associated Signs and Symptoms: Patient has a history of chronic venous insufficiency, diabetes mellitus type II, its reform fraction, hypertension HPI Description: 04/19/18 on evaluation today patient presents initially concerning an ulcer of his left great toe and left anterior shin. He states that the shin was definitely a trauma injury where he struck this on a metal pole injuring leg. In regard to the distal left great toe he's really not sure what happened here although he does have a significant callous noted as well. He does have a pacemaker. Fortunately his ABI was 0.98 on the right and 0.83 on the left this appears to be doing fairly well. Overall I'm pleased with that. Nonetheless he states that this really has not seem to be improving. He has been tolerating the dressing changes without complication. The left anterior leg ulcer began on 04/05/18 according to what he tells [place] today although one note I did have for  review said it was April 5. I'm inclined to believe it was the fifth she did have an office visit on 04/01/18 with his family nurse practitioner Faith Rogue. Nonetheless either way it's been going on this month for several weeks. The left great toe has actually been present since January 1 he does not know how this occurred. Fortunately has no pain at  the site although he does have pain on the left anterior shin. His hemoglobin A1c is 8.0 in March 2019. 04/26/18 on evaluation today patient presents for follow-up concerning his life into your lower [place] also as well as his left great toe also. Fortunately the shin actually appears to be doing better on evaluation today in my opinion I'm not seeing as much in the way of fluffernutter on the surface although it does still require debridement today this is improved. Size wise it's really not much different. With that being said the total ulcer actually appears to be a little bit more macerated today I'm not sure why they state that several days ago when this was changed that was not the case. Fortunately is not having severe pain although both areas do hurt the shin is worse. 05/03/18 on evaluation today patient appears to be doing better in regard to his left first toe and left shin ulcers. He is been tolerating the dressing changes without complication and the good news is this seems to be showing signs of getting better. Overall I'm very pleased with the progress that has been made over the past week. Patient is still having pain especially in regard to the left shin. 05/10/18 on evaluation today patient appears to be doing rather well in regard to his two ulcers. The toe ulcer appears to be smaller he does have some undermining however in this had to be cleaned out just a little bit. With that being said overall I feel like this is showing signs of improvement. The left anterior shin ulcer also showing signs of improvement at this time. There does  not appear to be any evidence of infection which is good news. 05/21/18 on evaluation today patient appears to be doing very well in regard to his left shin ulcer. He has been tolerating the dressing changes without complication. With that being said he is having issues at this point with his great toe on the left. We have not x-rayed that at this point I think we may need to. Nonetheless he has pain really at the roughly 5 o'clock location but nowhere else and I really cannot explain this I do not see any obvious form body. Nonetheless he continues to have issues with getting this area to close it's not progressing as nicely. 05/28/18 on evaluation today patient appears to be doing very well in regard to his left lower extremity anterior ulcer. He has been tolerating the dressing changes without complication. Overall this is making excellent progress with the collagen dressing. His left great toe x-ray did return and showed that he had no if you that normality. He did have a plantar heel spur but this is more degenerative. Otherwise just arthritis was noted no osteomyelitis and no obvious form body was visualized. Overall things seem to be going fairly well. With that being said he still has pain when looking at the toe from the plantar aspect at roughly the six back to the 4 o'clock location which is not quite as bad as last time but still hurts more than any other part of his wound. It does appear the Iodoflex has been of benefit however. 06/04/18 on evaluation today patient appears to be doing excellent in regard to his left anterior shin ulcer. This has made excellent progress and is showing signs of improving in fact very close to completely closing in healing. Nonetheless unfortunately his left great toe has not faired quite as well as far as progress is  concerned. He notes that after last week's Caloca, ALARIC GLADWIN. ([id number]) debridement he actually seem to be doing a little bit better for a  couple of days until Thursday of last week when he had increases in discomfort as well is changes in the draining. He states that the drainage has been more green in color according to his wife and what he has seen. He's also had more discomfort. He relates to me again that he really feels like this all started when he stepped on a nail although he does not exactly remember it it seemed to be a puncture wound that he feels may be the culprit for why all this started. Nonetheless I am getting more concerned about the possibility of osteomyelitis despite the fact that the x-ray was negative I was hopeful in this interim since last week to this week that we would see some improvement in the overall appearance of the toe. Unfortunately if anything I feel like it's a little worse. It was macerated but I think we can attribute this to the fact that the patient did have an episode last night we got stuck in the rain and he thinks the dressing got wet. He did not change it I told him in the future if this were to happen to please go ahead and change the dressing it won't hurt to change it more frequently in that situation. Otherwise other than the maceration I do not see any evidence of anything being any worse but it also does not appear to be any better. 06/11/18 on evaluation today patient's wounds both the great toe as well as the left shin both on the left side show signs of good improvement. Fortunately the patient's culture came back negative in regard to the toe and there does not appear to be any evidence of infection. His MRI is actually scheduled for 06/19/18. Fortunately this is right before I see him next week and then we can go to the results of the culture at that point. Patient is in agreement with that plan. With that being said otherwise he has been showing signs of having less pain in regard to the toe which is also good news my hope is that he does not have osteomyelitis. 06/18/18 on  evaluation today patient appears to be doing very well in regard to his left lower extremity ulcer. He also states at this point in time that he has been tolerating the dressing changes with the toe it appears also to be doing very well at this point. In general I'm happy with the progress that he seems to be making. With that being said he still continues to have pain in regard to the great toe ulcer fortunately this does not seem to be as significant as it was in the beginning but still nonetheless he is having some pain. No fevers, chills, nausea, or vomiting noted at this time. 06/25/18 on evaluation today patient actually appears to be doing very well in regard to his left anterior shin ulcer. He's been tolerating the dressing changes without complication in this seems to have done extremely well. In fact this appears to be healed. With that being said his toe ulcer still continues to get in trouble fortunately not as much as has been in the past and there have been some signs of improvement and better granulation although I still think he's getting a lot of callous to the area which seems to indicate a lot of pressure and friction. I  believe he may benefit from a total contact cast especially now that the left shin ulcer has healed. 07/02/18 on evaluation today patient appears to be doing rather well in regard to his left anterior shin ulcer which is actually completely healed at this point. With that being said he has left great toe ulcer actually appears to be doing better he states over the weekend he actually had a significant amount of drainage from the toe after it had been hurting for some time. He states when his wife change the dressing there was a lot of discharge and fluid he actually took a picture he showed me today. Since that time he has not noted as much tenderness he feels like something may have "popped out" 07/09/18 on evaluation today patient appears to be doing about the same in  regard to his toe ulcer. He really has not shown any signs of significant improvement this seems to just be maintaining at best. He continues to be extremely active in fact I think this may be the biggest issue that he continues to do things as normal in fact right now he's remodeling the bathroom. Obviously I think he is a very active individual and as previously discussed I think a total contact cast would be of great benefit for him. This was discussed with patient yet again today I think we are gonna proceed in this regard. 07/12/18 patient presents today for his first total contact cast change. He has had this on for three days and states that though he felt like it may be rubbing a little bit around his ankles there does not appear to be any injury at this point. Overall the wound actually looks better in my pinion. 07/16/18 on evaluation today patient's wound actually appears to be doing much better at this point. He has been tolerating the dressing changes without complication. With that being said I do believe the total contact cast has been of benefit he states he is having much less discomfort that he previously had. Overall I'm very pleased with how things have progressed in just one weeks worth of the total contact cast. 07/23/18 on evaluation today patient's toe ulcer on the left great toe actually appears to be showing signs of improvement which is good news. In general I'm very pleased with the progress that he has made in the cast over the past two weeks. Fortunately there does not appear to be any evidence of infection at this time. He also is not having as much maceration as was previously noted. The patient is happy in this regard that he notes that the boot and cast is not the most comfortable thing in the world. 07/30/18-He is here in follow-up evaluation for left great toe ulcer. He presents in a total contact cast, after removal he is noted to be macerated with new injury to the  dorsum of his left foot; he continues to work outside and this is most likely secondary to [name], Marcus T. ([id number]) excessive sweating versus wet from a shower. We will hold off on reapplying total contact cast, allowing the dorsal foot injury to resolve and allowing for complete drying as he will continue to work outside. He will have a nurse visit next Monday to evaluate dorsal foot partial thickness ulcer and moisture/maceration to foot and follow-up in 2 weeks Electronic Signature(s) Signed: 07/30/2018 10:18:05 AM By: Lawanda Cousins Entered By: Lawanda Cousins on 07/30/2018 10:18:04 [name], [name] ([id number]) -------------------------------------------------------------------------------- Physician Orders Details Patient Name: [name],  Marcus T. Date of Service: 07/30/2018 8:45 AM Medical Record Number: [id number] Patient Account Number: [id number] Date of Birth/Sex: [birth year] (67 y.o. M) Treating RN: Ahmed Prima Primary Care Provider: BESS, Valetta Fuller Other Clinician: Referring Provider: BESS, KATY Treating Provider/Extender: Cathie Olden in Treatment: 14 Verbal / Phone Orders: Yes Clinician: Carolyne Fiscal, Debi Read Back and Verified: Yes Diagnosis Coding Wound Cleansing Wound #2 Left Toe Great o Clean wound with Normal Saline. o Cleanse wound with mild soap and water Wound #3 Left,Dorsal Foot o Clean wound with Normal Saline. o Cleanse wound with mild soap and water Anesthetic (add to Medication List) Wound #2 Left Toe Great o Topical Lidocaine 4% cream applied to wound bed prior to debridement (In Clinic Only). Wound #3 Left,Dorsal Foot o Topical Lidocaine 4% cream applied to wound bed prior to debridement (In Clinic Only). Primary Wound Dressing Wound #2 Left Toe Great o Other: - endoform Secondary Dressing Wound #2 Left Toe Great o Gauze and Kerlix/Conform - secure with coban o Foam Wound #3 Left,Dorsal Foot o Other - band-aide Dressing  Change Frequency Wound #2 Left Toe Great o Change dressing every other day. Wound #3 Left,Dorsal Foot o Change dressing every day. Follow-up Appointments Wound #2 Left Toe Great o Return Appointment in 2 weeks. o Nurse Visit as needed - next Monday Off-Loading Wound #2 Left Toe [name], [name] ([id number]) o Other: - keep pressure off of area Wound #3 Left,Dorsal Foot o Other: - keep pressure off of area Additional Orders / Instructions Wound #2 Left Toe Great o Stop Smoking o Vitamin A; Vitamin C, Zinc - Please add a multivitamin with 100% of these o Increase protein intake. o Activity as tolerated Patient Medications Allergies: bee venom protein (honey bee), penicillin, oyster shell Notifications Medication Indication Start End lidocaine DOSE 1 - topical 4 % cream - 1 cream topical Electronic Signature(s) Signed: 07/30/2018 3:45:39 PM By: Lawanda Cousins Signed: 07/30/2018 3:52:40 PM By: Alric Quan Entered By: Alric Quan on 07/30/2018 09:41:52 [name], [name] ([id number]) -------------------------------------------------------------------------------- Prescription 07/30/2018 Patient Name: Marcus Loll T. Provider: Lawanda Cousins NP Date of Birth: 22-Nov-1951 NPI#: [id number] Sex: [name] DEA#: [id number] Phone #: [phone] License #: Patient Address: [place], [place] [address] [address], [place], [place] [address] [phone] Allergies bee venom protein (honey bee) penicillin oyster shell Medication Medication: Route: Strength: Form: lidocaine topical 4% cream Class: TOPICAL LOCAL ANESTHETICS Dose: Frequency / Time: Indication: 1 1 cream topical Number of Refills: Number of Units: 0 Generic Substitution: Start Date: End Date: Administered at [facility]: Yes Time Administered: Time  Discontinued: Note to Pharmacy: Signature(s): Date(s): Electronic Signature(s) ONEY, FOLZ ([id number]) Signed: 07/30/2018 3:45:39 PM By: Lawanda Cousins Signed: 07/30/2018 3:52:40 PM By: Alric Quan Entered By: Alric Quan on 07/30/2018 09:41:52 Racanelli, [name] ([id number]) --------------------------------------------------------------------------------  Problem List Details Patient Name: Marcus Loll T. Date of Service: 07/30/2018 8:45 AM Medical Record Number: [id number] Patient Account Number: [id number] Date of Birth/Sex: [birth year] (67 y.o. M) Treating RN: Ahmed Prima Primary Care Provider: BESS, KATY Other Clinician: Referring Provider: BESS, KATY Treating Provider/Extender: Cathie Olden in Treatment: 14 Active Problems ICD-10 Evaluated Encounter Code Description Active Date Today Diagnosis L97.522 Non-pressure chronic ulcer of other part of left foot with fat 04/19/2018 No Yes layer exposed L97.521 Non-pressure chronic ulcer of other part of left foot limited to 07/30/2018 No Yes breakdown of skin I87.2 Venous insufficiency (chronic) (peripheral) 04/19/2018 No  Yes E11.621 Type 2 diabetes mellitus with foot ulcer 04/19/2018 No Yes I63.9 Cerebral infarction, unspecified 04/19/2018 No Yes I10 Essential (primary) hypertension 04/19/2018 No Yes Inactive Problems Resolved Problems ICD-10 Code Description Active Date Resolved Date L97.822 Non-pressure chronic ulcer of other part of left lower leg with fat 04/19/2018 04/19/2018 layer exposed S81.812A Laceration without foreign body, left lower leg, initial encounter 04/19/2018 04/19/2018 Electronic Signature(s) SHONTE, SODERLUND ([id number]) Signed: 07/30/2018 10:13:58 AM By: Lawanda Cousins Entered By: Lawanda Cousins on 07/30/2018 10:13:58 Esco, [name] ([id number]) -------------------------------------------------------------------------------- Progress Note Details Patient Name: Marcus Loll T. Date of  Service: 07/30/2018 8:45 AM Medical Record Number: [id number] Patient Account Number: [id number] Date of Birth/Sex: 1951/07/13 (67 y.o. M) Treating RN: Ahmed Prima Primary Care Provider: BESS, KATY Other Clinician: Referring Provider: BESS, KATY Treating Provider/Extender: Cathie Olden in Treatment: 14 Subjective Chief Complaint Information obtained from Patient Left lower leg and left great toe ulcer History of Present Illness (HPI) The following HPI elements were documented for the patient's wound: Associated Signs and Symptoms: Patient has a history of chronic venous insufficiency, diabetes mellitus type II, its reform fraction, hypertension 04/19/18 on evaluation today patient presents initially concerning an ulcer of his left great toe and left anterior shin. He states that the shin was definitely a trauma injury where he struck this on a metal pole injuring leg. In regard to the distal left great toe he's really not sure what happened here although he does have a significant callous noted as well. He does have a pacemaker. Fortunately his ABI was 0.98 on the right and 0.83 on the left this appears to be doing fairly well. Overall I'm pleased with that. Nonetheless he states that this really has not seem to be improving. He has been tolerating the dressing changes without complication. The left anterior leg ulcer began on 04/05/18 according to what he tells [place] today although one note I did have for review said it was April 5. I'm inclined to believe it was the fifth she did have an office visit on 04/01/18 with his family nurse practitioner Faith Rogue. Nonetheless either way it's been going on this month for several weeks. The left great toe has actually been present since January 1 he does not know how this occurred. Fortunately has no pain at the site although he does have pain on the left anterior shin. His hemoglobin A1c is 8.0 in March 2019. 04/26/18 on evaluation today  patient presents for follow-up concerning his life into your lower [place] also as well as his left great toe also. Fortunately the shin actually appears to be doing better on evaluation today in my opinion I'm not seeing as much in the way of fluffernutter on the surface although it does still require debridement today this is improved. Size wise it's really not much different. With that being said the total ulcer actually appears to be a little bit more macerated today I'm not sure why they state that several days ago when this was changed that was not the case. Fortunately is not having severe pain although both areas do hurt the shin is worse. 05/03/18 on evaluation today patient appears to be doing better in regard to his left first toe and left shin ulcers. He is been tolerating the dressing changes without complication and the good news is this seems to be showing signs of getting better. Overall I'm very pleased with the progress that has been made over the past week. Patient is still having pain  especially in regard to the left shin. 05/10/18 on evaluation today patient appears to be doing rather well in regard to his two ulcers. The toe ulcer appears to be smaller he does have some undermining however in this had to be cleaned out just a little bit. With that being said overall I feel like this is showing signs of improvement. The left anterior shin ulcer also showing signs of improvement at this time. There does not appear to be any evidence of infection which is good news. 05/21/18 on evaluation today patient appears to be doing very well in regard to his left shin ulcer. He has been tolerating the dressing changes without complication. With that being said he is having issues at this point with his great toe on the left. We have not x-rayed that at this point I think we may need to. Nonetheless he has pain really at the roughly 5 o'clock location but nowhere else and I really cannot  explain this I do not see any obvious form body. Nonetheless he continues to have issues with getting this area to close it's not progressing as nicely. 05/28/18 on evaluation today patient appears to be doing very well in regard to his left lower extremity anterior ulcer. He has been tolerating the dressing changes without complication. Overall this is making excellent progress with the collagen dressing. His left great toe x-ray did return and showed that he had no if you that normality. He did have a plantar heel spur [name], Marcus T. ([id number]) but this is more degenerative. Otherwise just arthritis was noted no osteomyelitis and no obvious form body was visualized. Overall things seem to be going fairly well. With that being said he still has pain when looking at the toe from the plantar aspect at roughly the six back to the 4 o'clock location which is not quite as bad as last time but still hurts more than any other part of his wound. It does appear the Iodoflex has been of benefit however. 06/04/18 on evaluation today patient appears to be doing excellent in regard to his left anterior shin ulcer. This has made excellent progress and is showing signs of improving in fact very close to completely closing in healing. Nonetheless unfortunately his left great toe has not faired quite as well as far as progress is concerned. He notes that after last week's debridement he actually seem to be doing a little bit better for a couple of days until Thursday of last week when he had increases in discomfort as well is changes in the draining. He states that the drainage has been more green in color according to his wife and what he has seen. He's also had more discomfort. He relates to me again that he really feels like this all started when he stepped on a nail although he does not exactly remember it it seemed to be a puncture wound that he feels may be the culprit for why all this started. Nonetheless I  am getting more concerned about the possibility of osteomyelitis despite the fact that the x-ray was negative I was hopeful in this interim since last week to this week that we would see some improvement in the overall appearance of the toe. Unfortunately if anything I feel like it's a little worse. It was macerated but I think we can attribute this to the fact that the patient did have an episode last night we got stuck in the rain and he thinks the dressing got  wet. He did not change it I told him in the future if this were to happen to please go ahead and change the dressing it won't hurt to change it more frequently in that situation. Otherwise other than the maceration I do not see any evidence of anything being any worse but it also does not appear to be any better. 06/11/18 on evaluation today patient's wounds both the great toe as well as the left shin both on the left side show signs of good improvement. Fortunately the patient's culture came back negative in regard to the toe and there does not appear to be any evidence of infection. His MRI is actually scheduled for 06/19/18. Fortunately this is right before I see him next week and then we can go to the results of the culture at that point. Patient is in agreement with that plan. With that being said otherwise he has been showing signs of having less pain in regard to the toe which is also good news my hope is that he does not have osteomyelitis. 06/18/18 on evaluation today patient appears to be doing very well in regard to his left lower extremity ulcer. He also states at this point in time that he has been tolerating the dressing changes with the toe it appears also to be doing very well at this point. In general I'm happy with the progress that he seems to be making. With that being said he still continues to have pain in regard to the great toe ulcer fortunately this does not seem to be as significant as it was in the beginning but  still nonetheless he is having some pain. No fevers, chills, nausea, or vomiting noted at this time. 06/25/18 on evaluation today patient actually appears to be doing very well in regard to his left anterior shin ulcer. He's been tolerating the dressing changes without complication in this seems to have done extremely well. In fact this appears to be healed. With that being said his toe ulcer still continues to get in trouble fortunately not as much as has been in the past and there have been some signs of improvement and better granulation although I still think he's getting a lot of callous to the area which seems to indicate a lot of pressure and friction. I believe he may benefit from a total contact cast especially now that the left shin ulcer has healed. 07/02/18 on evaluation today patient appears to be doing rather well in regard to his left anterior shin ulcer which is actually completely healed at this point. With that being said he has left great toe ulcer actually appears to be doing better he states over the weekend he actually had a significant amount of drainage from the toe after it had been hurting for some time. He states when his wife change the dressing there was a lot of discharge and fluid he actually took a picture he showed me today. Since that time he has not noted as much tenderness he feels like something may have "popped out" 07/09/18 on evaluation today patient appears to be doing about the same in regard to his toe ulcer. He really has not shown any signs of significant improvement this seems to just be maintaining at best. He continues to be extremely active in fact I think this may be the biggest issue that he continues to do things as normal in fact right now he's remodeling the bathroom. Obviously I think he is a very active  individual and as previously discussed I think a total contact cast would be of great benefit for him. This was discussed with patient yet again  today I think we are gonna proceed in this regard. 07/12/18 patient presents today for his first total contact cast change. He has had this on for three days and states that though he felt like it may be rubbing a little bit around his ankles there does not appear to be any injury at this point. Overall the wound actually looks better in my pinion. 07/16/18 on evaluation today patient's wound actually appears to be doing much better at this point. He has been tolerating the dressing changes without complication. With that being said I do believe the total contact cast has been of benefit he states he is having much less discomfort that he previously had. Overall I'm very pleased with how things have progressed in just one weeks worth of the total contact cast. [name], [name] ([id number]) 07/23/18 on evaluation today patient's toe ulcer on the left great toe actually appears to be showing signs of improvement which is good news. In general I'm very pleased with the progress that he has made in the cast over the past two weeks. Fortunately there does not appear to be any evidence of infection at this time. He also is not having as much maceration as was previously noted. The patient is happy in this regard that he notes that the boot and cast is not the most comfortable thing in the world. 07/30/18-He is here in follow-up evaluation for left great toe ulcer. He presents in a total contact cast, after removal he is noted to be macerated with new injury to the dorsum of his left foot; he continues to work outside and this is most likely secondary to excessive sweating versus wet from a shower. We will hold off on reapplying total contact cast, allowing the dorsal foot injury to resolve and allowing for complete drying as he will continue to work outside. He will have a nurse visit next Monday to evaluate dorsal foot partial thickness ulcer and moisture/maceration to foot and follow-up in 2  weeks Objective Constitutional Vitals Time Taken: 9:06 AM, Height: 69 in, Weight: 238 lbs, BMI: 35.1, Temperature: 97.5 F, Pulse: 71 bpm, Respiratory Rate: 16 breaths/min, Blood Pressure: 110/63 mmHg. Integumentary (Hair, Skin) Wound #2 status is Open. Original cause of wound was Trauma. The wound is located on the Left Toe Great. The wound measures 0.2cm length x 0.2cm width x 0.4cm depth; 0.031cm^2 area and 0.013cm^3 volume. There is Fat Layer (Subcutaneous Tissue) Exposed exposed. There is no tunneling or undermining noted. There is a small amount of serous drainage noted. The wound margin is flat and intact. There is medium (34-66%) pink granulation within the wound bed. There is a medium (34-66%) amount of necrotic tissue within the wound bed including Adherent Slough. The periwound skin appearance did not exhibit: Callus, Crepitus, Excoriation, Induration, Rash, Scarring, Dry/Scaly, Maceration, Atrophie Blanche, Cyanosis, Ecchymosis, Hemosiderin Staining, Mottled, Pallor, Rubor, Erythema. Periwound temperature was noted as No Abnormality. The periwound has tenderness on palpation. Wound #3 status is Open. Original cause of wound was Gradually Appeared. The wound is located on the Left,Dorsal Foot. The wound measures 0.5cm length x 0.4cm width x 0.1cm depth; 0.157cm^2 area and 0.016cm^3 volume. There is no tunneling or undermining noted. There is a medium amount of serous drainage noted. The wound margin is flat and intact. There is large (67-100%) pink granulation within the wound  bed. There is no necrotic tissue within the wound bed. The periwound skin appearance exhibited: Erythema. The periwound skin appearance did not exhibit: Callus, Crepitus, Excoriation, Induration, Rash, Scarring, Dry/Scaly, Maceration, Atrophie Blanche, Cyanosis, Ecchymosis, Hemosiderin Staining, Mottled, Pallor, Rubor. The surrounding wound skin color is noted with erythema which is circumferential.  Periwound temperature was noted as No Abnormality. 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 ([id number]) Cerebral infarction, unspecified Essential (primary) hypertension Procedures Wound #2 Pre-procedure diagnosis of Wound #2 is a Diabetic Wound/Ulcer of the Lower Extremity located on the Left Toe Great .Severity of Tissue Pre Debridement is: Fat layer exposed. There was a Excisional Skin/Subcutaneous Tissue Debridement with a total area of 0.04 sq cm performed by Lawanda Cousins, NP. With the following instrument(s): Curette, and Forceps to remove Viable and Non-Viable tissue/material. Material removed includes Callus, Subcutaneous Tissue, Slough, and Fibrin/Exudate after achieving pain control using Lidocaine 4% Topical Solution. No specimens were taken. A time out was conducted at 09:32, prior to the start of the procedure. A Minimum amount of bleeding was controlled with Pressure. The procedure was tolerated well with a pain level of 0 throughout and a pain level of 0 following the procedure. Patient s Level of Consciousness post procedure was recorded as Awake and Alert. Post Debridement Measurements: 0.3cm length x 0.3cm width x 0.4cm depth; 0.028cm^3 volume. Character of Wound/Ulcer Post Debridement requires further debridement. Severity of Tissue Post Debridement is: Fat layer exposed. Post procedure Diagnosis Wound #2: Same as Pre-Procedure Plan Wound Cleansing: Wound #2 Left Toe Great: Clean wound with Normal Saline. Cleanse wound with mild soap and water Wound #3 Left,Dorsal Foot: Clean wound with Normal Saline. Cleanse wound with mild soap and water Anesthetic (add to Medication List): Wound #2 Left Toe Great: Topical  Lidocaine 4% cream applied to wound bed prior to debridement (In Clinic Only). Wound #3 Left,Dorsal Foot: Topical Lidocaine 4% cream applied to wound bed prior to debridement (In Clinic Only). Primary Wound Dressing: Wound #2 Left Toe Great: Other: - endoform Secondary Dressing: Wound #2 Left Toe Great: Gauze and Kerlix/Conform - secure with coban Foam Wound #3 Left,Dorsal Foot: Other - band-aide Dressing Change Frequency: Wound #2 Left Toe Great: Change dressing every other day. Wound #3 Left,Dorsal Foot: Change dressing every day. Follow-up Appointments: [name], [name] ([id number]) Wound #2 Left Toe Great: Return Appointment in 2 weeks. Nurse Visit as needed - next Monday Off-Loading: Wound #2 Left Toe Great: Other: - keep pressure off of area Wound #3 Left,Dorsal Foot: Other: - keep pressure off of area Additional Orders / Instructions: Wound #2 Left Toe Great: Stop Smoking Vitamin A; Vitamin C, Zinc - Please add a multivitamin with 100% of these Increase protein intake. Activity as tolerated The following medication(s) was prescribed: lidocaine topical 4 % cream 1 1 cream topical was prescribed at facility Electronic Signature(s) Signed: 07/30/2018 10:18:17 AM By: Lawanda Cousins Entered By: Lawanda Cousins on 07/30/2018 10:18:17 Cedano, [name] ([id number]) -------------------------------------------------------------------------------- SuperBill Details Patient Name: [name]. Date of Service: 07/30/2018 Medical Record Number: [id number] Patient Account Number: [id number] Date of Birth/Sex: [birth year] (67 y.o. M) Treating RN: Ahmed Prima Primary Care Provider: BESS, Valetta Fuller Other Clinician: Referring Provider: BESS, KATY Treating Provider/Extender: Cathie Olden in Treatment: 14 Diagnosis Coding ICD-10 Codes Code Description L97.522 Non-pressure  chronic ulcer of other part of left foot with fat layer exposed L97.521 Non-pressure chronic ulcer of  other part of left foot limited to breakdown of skin I87.2 Venous insufficiency (chronic) (peripheral) E11.621 Type 2 diabetes mellitus with foot ulcer I63.9 Cerebral infarction, unspecified I10 Essential (primary) hypertension Facility Procedures CPT4 Code: [id number] Description: 84069 - DEB SUBQ TISSUE 20 SQ CM/< ICD-10 Diagnosis Description L97.522 Non-pressure chronic ulcer of other part of left foot with fat Modifier: layer exposed Quantity: 1 Physician Procedures CPT4 Code: 8614830 Description: 11042 - WC PHYS SUBQ TISS 20 SQ CM ICD-10 Diagnosis Description L97.522 Non-pressure chronic ulcer of other part of left foot with fat Modifier: layer exposed Quantity: 1 Electronic Signature(s) Signed: 07/30/2018 10:18:30 AM By: Lawanda Cousins Entered By: Lawanda Cousins on 07/30/2018 10:18:30

## 2018-08-09 NOTE — Telephone Encounter (Signed)
New message:          [facility] Pre-operative Risk Assessment    Request for surgical clearance:  1. What type of surgery is being performed? Cystoscopy ureteroscopy laser liotripsy/stent replacement   2. When is this surgery scheduled? TBD   3. What type of clearance is required (medical clearance vs. Pharmacy clearance to hold med vs. Both)? Both  4. Are there any medications that need to be held prior to surgery and how long? apixaban (ELIQUIS) 5 MG TABS tablet needs to be held 2-3 days prior to procedure   5. Practice name and name of physician performing surgery? Dr. Annie Main Dahlstedt/[facility]  6. What is your office phone number [phone]   7.   What is your office fax number [phone]  8.   Anesthesia type (None, local, MAC, general) ? General   [name] 08/09/2018, 12:31 PM  _________________________________________________________________   (provider comments below)

## 2018-08-13 ENCOUNTER — Encounter: Payer: PPO | Admitting: Physician Assistant

## 2018-08-13 DIAGNOSIS — L97522 Non-pressure chronic ulcer of other part of left foot with fat layer exposed: Secondary | ICD-10-CM | POA: Diagnosis not present

## 2018-08-13 DIAGNOSIS — E11621 Type 2 diabetes mellitus with foot ulcer: Secondary | ICD-10-CM | POA: Diagnosis not present

## 2018-08-13 NOTE — Telephone Encounter (Signed)
S/w pt has a upcoming appt with Jory Sims, NP for surgical clearance.

## 2018-08-13 NOTE — Telephone Encounter (Signed)
   Primary Cardiologist:Mihai Croitoru, MD  Chart reviewed as part of pre-operative protocol coverage. Because of Colvin Blatt Cerrone's past medical history and time since last visit, he/she will require a follow-up visit in order to better assess preoperative cardiovascular risk.  Pre-op covering staff: - Please schedule appointment and call patient to inform them. - Please contact requesting surgeon's office via preferred method (i.e, phone, fax) to inform them of need for appointment prior to surgery.  Truitt Merle, NP  08/13/2018, 1:29 PM

## 2018-08-16 NOTE — Progress Notes (Signed)
Marcus, [name] ([id number]) Visit Report for 08/05/2018 Arrival Information Details Patient Name: [name], [name]. Date of Service: 08/05/2018 10:30 AM Medical Record Number: [id number] Patient Account Number: [id number] Date of Birth/Sex: Mar 17, 1951 (67 y.o. M) Treating RN: Ahmed Prima Primary Care Rockwell Zentz: BESS, KATY Other Clinician: Referring Zunairah Devers: BESS, KATY Treating Allard Lightsey/Extender: STONE III, HOYT Weeks in Treatment: 15 Visit Information History Since Last Visit All ordered tests and consults were completed: No Patient Arrived: Ambulatory Added or deleted any medications: No Arrival Time: 10:24 Any new allergies or adverse reactions: No Accompanied By: self Had a fall or experienced change in No Transfer Assistance: None activities of daily living that may affect Patient Identification Verified: Yes risk of falls: Secondary Verification Process Completed: Yes Signs or symptoms of abuse/neglect since last visito No Patient Requires Transmission-Based No Hospitalized since last visit: No Precautions: Implantable device outside of the clinic excluding No Patient Has Alerts: Yes cellular tissue based products placed in the center Patient Alerts: Type II since last visit: Diabetic Has Dressing in Place as Prescribed: Yes Has Compression in Place as Prescribed: Yes Pain Present Now: Yes Electronic Signature(s) Signed: 08/05/2018 11:34:14 AM By: Alric Quan Entered By: Alric Quan on 08/05/2018 10:25:11 Hogland, Huntley Dec ([id number]) -------------------------------------------------------------------------------- Clinic Level of Care Assessment Details Patient Name: [name]. Date of Service: 08/05/2018 10:30 AM Medical Record Number: [id number] Patient Account Number: [id number] Date of Birth/Sex: March 30, 1951 (67 y.o. M) Treating RN: Ahmed Prima Primary Care Micha Erck: BESS, KATY Other Clinician: Referring Beautifull Cisar: BESS, KATY Treating  Eleanor Gatliff/Extender: STONE III, HOYT Weeks in Treatment: 15 Clinic Level of Care Assessment Items TOOL 4 Quantity Score X - Use when only an EandM is performed on FOLLOW-UP visit 1 0 ASSESSMENTS - Nursing Assessment / Reassessment X - Reassessment of Co-morbidities (includes updates in patient status) 1 10 X- 1 5 Reassessment of Adherence to Treatment Plan ASSESSMENTS - Wound and Skin Assessment / Reassessment X - Simple Wound Assessment / Reassessment - one wound 1 5 []  - 0 Complex Wound Assessment / Reassessment - multiple wounds []  - 0 Dermatologic / Skin Assessment (not related to wound area) ASSESSMENTS - Focused Assessment []  - Circumferential Edema Measurements - multi extremities 0 []  - 0 Nutritional Assessment / Counseling / Intervention []  - 0 Lower Extremity Assessment (monofilament, tuning fork, pulses) []  - 0 Peripheral Arterial Disease Assessment (using hand held doppler) ASSESSMENTS - Ostomy and/or Continence Assessment and Care []  - Incontinence Assessment and Management 0 []  - 0 Ostomy Care Assessment and Management (repouching, etc.) PROCESS - Coordination of Care X - Simple Patient / Family Education for ongoing care 1 15 []  - 0 Complex (extensive) Patient / Family Education for ongoing care []  - 0 Staff obtains Programmer, systems, Records, Test Results / Process Orders []  - 0 Staff telephones HHA, Nursing Homes / Clarify orders / etc []  - 0 Routine Transfer to another Facility (non-emergent condition) []  - 0 Routine Hospital Admission (non-emergent condition) []  - 0 New Admissions / Biomedical engineer / Ordering NPWT, Apligraf, etc. []  - 0 Emergency Hospital Admission (emergent condition) X- 1 10 Simple Discharge Coordination YONIS, CARREON ([id number]) []  - 0 Complex (extensive) Discharge Coordination PROCESS - Special Needs []  - Pediatric / Minor Patient Management 0 []  - 0 Isolation Patient Management []  - 0 Hearing / Language / Visual special  needs []  - 0 Assessment of Community assistance (transportation, D/C planning, etc.) []  - 0 Additional assistance / Altered mentation []  - 0 Support Surface(s) Assessment (bed, cushion, seat, etc.)  INTERVENTIONS - Wound Cleansing / Measurement X - Simple Wound Cleansing - one wound 1 5 []  - 0 Complex Wound Cleansing - multiple wounds X- 1 5 Wound Imaging (photographs - any number of wounds) []  - 0 Wound Tracing (instead of photographs) X- 1 5 Simple Wound Measurement - one wound []  - 0 Complex Wound Measurement - multiple wounds INTERVENTIONS - Wound Dressings X - Small Wound Dressing one or multiple wounds 1 10 []  - 0 Medium Wound Dressing one or multiple wounds []  - 0 Large Wound Dressing one or multiple wounds []  - 0 Application of Medications - topical []  - 0 Application of Medications - injection INTERVENTIONS - Miscellaneous []  - External ear exam 0 []  - 0 Specimen Collection (cultures, biopsies, blood, body fluids, etc.) []  - 0 Specimen(s) / Culture(s) sent or taken to Lab for analysis []  - 0 Patient Transfer (multiple staff / Civil Service fast streamer / Similar devices) []  - 0 Simple Staple / Suture removal (25 or less) []  - 0 Complex Staple / Suture removal (26 or more) []  - 0 Hypo / Hyperglycemic Management (close monitor of Blood Glucose) []  - 0 Ankle / Brachial Index (ABI) - do not check if billed separately []  - 0 Vital Signs Contee, Huntley Dec ([id number]) Has the patient been seen at the hospital within the last three years: Yes Total Score: 70 Level Of Care: New/Established - Level 2 Electronic Signature(s) Signed: 08/05/2018 11:34:14 AM By: Alric Quan Entered By: Alric Quan on 08/05/2018 10:30:51 Vandagriff, Huntley Dec ([id number]) -------------------------------------------------------------------------------- Encounter Discharge Information Details Patient Name: Marcus Loll T. Date of Service: 08/05/2018 10:30 AM Medical Record Number:  [id number] Patient Account Number: [id number] Date of Birth/Sex: 07-06-1951 (67 y.o. M) Treating RN: Ahmed Prima Primary Care Omesha Bowerman: BESS, KATY Other Clinician: Referring Samy Ryner: BESS, KATY Treating Icker Swigert/Extender: STONE III, HOYT Weeks in Treatment: 15 Encounter Discharge Information Items Discharge Condition: Stable Ambulatory Status: Ambulatory Discharge Destination: Home Transportation: Private Auto Accompanied By: self Schedule Follow-up Appointment: Yes Clinical Summary of Care: Electronic Signature(s) Signed: 08/05/2018 11:34:14 AM By: Alric Quan Entered By: Alric Quan on 08/05/2018 10:30:30 Jarboe, Huntley Dec ([id number]) -------------------------------------------------------------------------------- Patient/Caregiver Education Details Patient Name: [name]. Date of Service: 08/05/2018 10:30 AM Medical Record Number: [id number] Patient Account Number: [id number] Date of Birth/Gender: 07-Dec-1951 (67 y.o. M) Treating RN: Ahmed Prima Primary Care Physician: BESS, KATY Other Clinician: Referring Physician: BESS, KATY Treating Physician/Extender: Melburn Hake, HOYT Weeks in Treatment: 15 Education Assessment Education Provided To: Patient Education Topics Provided Wound/Skin Impairment: Handouts: Caring for Your Ulcer, Skin Care Do's and Dont's, Other: change dressing as ordered Methods: Demonstration, Explain/Verbal Responses: State content correctly Electronic Signature(s) Signed: 08/05/2018 11:34:14 AM By: Alric Quan Entered By: Alric Quan on 08/05/2018 10:30:17 Buckels, Huntley Dec ([id number]) -------------------------------------------------------------------------------- Wound Assessment Details Patient Name: Marcus Loll T. Date of Service: 08/05/2018 10:30 AM Medical Record Number: [id number] Patient Account Number: [id number] Date of Birth/Sex: Oct 18, 1951 (67 y.o. M) Treating RN: Ahmed Prima Primary Care Ingram Onnen:  BESS, KATY Other Clinician: Referring Adley Castello: BESS, KATY Treating Gabriellah Rabel/Extender: STONE III, HOYT Weeks in Treatment: 15 Wound Status Wound Number: 2 Primary Diabetic Wound/Ulcer of the Lower Extremity Etiology: Wound Location: Left Toe Great Secondary Trauma, Other Wounding Event: Trauma Etiology: Date Acquired: 12/25/2017 Wound Open Weeks Of Treatment: 15 Status: Clustered Wound: No Comorbid Lymphedema, Arrhythmia, Coronary Artery Pending Amputation On Presentation History: Disease, Hypertension, Myocardial Infarction, Type II Diabetes, Neuropathy, Received Chemotherapy, Received Radiation Photos Photo Uploaded By: Alric Quan on 08/05/2018 10:42:20 Wound Measurements Length: (cm) 0.2  Width: (cm) 0.2 Depth: (cm) 0.3 Area: (cm) 0.031 Volume: (cm) 0.009 % Reduction in Area: 34% % Reduction in Volume: -80% Epithelialization: Medium (34-66%) Tunneling: No Undermining: No Wound Description Classification: Grade 1 Wound Margin: Flat and Intact Exudate Amount: Small Exudate Type: Serous Exudate Color: amber Foul Odor After Cleansing: No Slough/Fibrino Yes Wound Bed Granulation Amount: Medium (34-66%) Exposed Structure Granulation Quality: Pink Fascia Exposed: No Necrotic Amount: Medium (34-66%) Fat Layer (Subcutaneous Tissue) Exposed: Yes Necrotic Quality: Adherent Slough Tendon Exposed: No Muscle Exposed: No Joint Exposed: No Wooley, Huntley Dec ([id number]) Bone Exposed: No Periwound Skin Texture Texture Color No Abnormalities Noted: No No Abnormalities Noted: No Callus: No Atrophie Blanche: No Crepitus: No Cyanosis: No Excoriation: No Ecchymosis: No Induration: No Erythema: No Rash: No Hemosiderin Staining: No Scarring: No Mottled: No Pallor: No Moisture Rubor: No No Abnormalities Noted: No Dry / Scaly: No Temperature / Pain Maceration: No Temperature: No Abnormality Tenderness on Palpation: Yes Wound Preparation Ulcer  Cleansing: Rinsed/Irrigated with Saline, Other: soap and water, Topical Anesthetic Applied: None Treatment Notes Wound #2 (Left Toe Great) 1. Cleansed with: Clean wound with Normal Saline 4. Dressing Applied: Other dressing (specify in notes) 5. Secondary Dressing Applied Foam Kerlix/Conform 7. Secured with Tape Notes endoform, foam with cut out, gauze then wrap with conform wrap with tape Electronic Signature(s) Signed: 08/05/2018 11:34:14 AM By: Alric Quan Entered By: Alric Quan on 08/05/2018 10:28:11 Maimone, Huntley Dec ([id number]) -------------------------------------------------------------------------------- Wound Assessment Details Patient Name: Marcus Loll T. Date of Service: 08/05/2018 10:30 AM Medical Record Number: [id number] Patient Account Number: [id number] Date of Birth/Sex: Jul 03, 1951 (67 y.o. M) Treating RN: Ahmed Prima Primary Care Suleman Gunning: BESS, KATY Other Clinician: Referring Zania Kalisz: BESS, KATY Treating Quayshaun Hubbert/Extender: STONE III, HOYT Weeks in Treatment: 15 Wound Status Wound Number: 3 Primary Diabetic Wound/Ulcer of the Lower Extremity Etiology: Wound Location: Left Foot - Dorsal Wound Open Wounding Event: Gradually Appeared Status: Date Acquired: 07/30/2018 Comorbid Lymphedema, Arrhythmia, Coronary Artery Weeks Of Treatment: 0 History: Disease, Hypertension, Myocardial Infarction, Clustered Wound: No Type II Diabetes, Neuropathy, Received Chemotherapy, Received Radiation Photos Photo Uploaded By: Alric Quan on 08/05/2018 10:42:21 Wound Measurements Length: (cm) 0.1 % Reduct Width: (cm) 0.1 % Reduct Depth: (cm) 0.1 Epitheli Area: (cm) 0.008 Tunneli Volume: (cm) 0.001 Undermi ion in Area: 94.9% ion in Volume: 93.8% alization: None ng: No ning: No Wound Description Classification: Grade 1 Wound Margin: Flat and Intact Exudate Amount: None Present Foul Odor After Cleansing: No Slough/Fibrino No Wound  Bed Granulation Amount: None Present (0%) Exposed Structure Necrotic Amount: Large (67-100%) Fascia Exposed: No Necrotic Quality: Eschar Fat Layer (Subcutaneous Tissue) Exposed: No Tendon Exposed: No Muscle Exposed: No Joint Exposed: No Bone Exposed: No Periwound Skin Texture Texture Color Boggan, Michell T. ([id number]) No Abnormalities Noted: No No Abnormalities Noted: No Callus: No Atrophie Blanche: No Crepitus: No Cyanosis: No Excoriation: No Ecchymosis: No Induration: No Erythema: Yes Rash: No Erythema Location: Circumferential Scarring: No Hemosiderin Staining: No Mottled: No Moisture Pallor: No No Abnormalities Noted: No Rubor: No Dry / Scaly: No Maceration: No Temperature / Pain Temperature: No Abnormality Wound Preparation Ulcer Cleansing: Rinsed/Irrigated with Saline, Other: soap and water, Topical Anesthetic Applied: None Treatment Notes Wound #3 (Left, Dorsal Foot) 1. Cleansed with: Clean wound with Normal Saline Notes coverlet Electronic Signature(s) Signed: 08/05/2018 11:34:14 AM By: Alric Quan Entered By: Alric Quan on 08/05/2018 86:76:19

## 2018-08-20 ENCOUNTER — Encounter: Payer: PPO | Admitting: Physician Assistant

## 2018-08-20 DIAGNOSIS — E11621 Type 2 diabetes mellitus with foot ulcer: Secondary | ICD-10-CM | POA: Diagnosis not present

## 2018-08-20 DIAGNOSIS — L97522 Non-pressure chronic ulcer of other part of left foot with fat layer exposed: Secondary | ICD-10-CM | POA: Diagnosis not present

## 2018-08-21 NOTE — Progress Notes (Signed)
Cardiology Office Note   Date:  08/22/2018   ID:  [name], DOB [birth year], MRN [id number]  PCP:  Esaw Grandchild, NP  Cardiologist: Dr. Sallyanne Kuster   Chief Complaint  Patient presents with  . Pre-op Exam    Ureter stent and lithotripsy      History of Present Illness: [name] is a 67 y.o. male who presents for ongoing assessment and management of CAD, hx of CABG X 4 in 2009, asymptomatic atrial fib,  prior inferior MI, HTN, HLD, with other history of Type II diabetes, and previous right frontal cortical infarct.   He has a loop recorder in situ with occasional brief episodes of atrial fib with RVR. He has a history of tobacco abuse. He has chronic LEE. He also had injury to great toe after stepping on a nail in January and was followed by wound clinic.   On last visit with Dr. Sallyanne Kuster he was asymptomatic, he was continued on Eliquis due to high CHADS VASC Score of 5 (CVA, HTN, DM, CAD). Weight loss and increased exercise, with smoking cessation was discussed.    He is scheduled to have cystoscopy and ureteroscopy with laser/stent replacement. He is here for medical and pharmacy clearance prior to the procedure.   He is without complaints of chest pain, DOE, or increased fatigue. He is medically compliant and remains active. He is anxious to have this procedure completed.   Past Medical History:  Diagnosis Date  . Chronic venous insufficiency LOWER EXTREMITIES  . Coronary artery disease CARDIOLOGIST - DR  [id number]  LAST 1 WK AGO -- WILL REQUEST NOTE AND STRESS TEST  . Diabetes mellitus without complication ([place])   . Hyperglycemia   . Hyperlipidemia   . Hypertension   . Mixed dyslipidemia   . Myocardial infarction ([place])   . Neuropathy   . Nocturia   . Prostate cancer ([place]) 08/18/11   gleason 8, volume 24.4cc  . S/P CABG x 4   . ST elevation MI (STEMI) ([place]) 02-10-2008   S/P CABG  . Stroke ([place])   . Urinary hesitancy   . Vision abnormalities     Past Surgical  History:  Procedure Laterality Date  . CARDIAC CATHETERIZATION  05/01/2012   grafts widely patent  . CARDIOVASCULAR STRESS TEST  03-15-2010   INFERIOR WALL SCAR WITHOUT ANY MEANINGFUL ISCHEMIA/ EF 46% / LOW RISK SCAN  . CORONARY ARTERY BYPASS GRAFT  02-10-2008  DR Einar Gip   X4 VESSEL DISEASE / [facility] CABG  . CYSTOSCOPY  02/08/2012   Procedure: CYSTOSCOPY FLEXIBLE;  Surgeon: Franchot Gallo, MD;  Location: [facility];  Service: Urology;;  . EP IMPLANTABLE DEVICE N/A 08/14/2016   Procedure: Loop Recorder Insertion;  Surgeon: Evans Lance, MD;  Location: [place] [facility];  Service: Cardiovascular;  Laterality: N/A;  . KNEE SURGERY  age 27   RIGHT  . LEFT HEART CATHETERIZATION WITH CORONARY/GRAFT ANGIOGRAM N/A 05/01/2012   Procedure: LEFT HEART CATHETERIZATION WITH Beatrix Fetters;  Surgeon: Sanda Klein, MD;  Location: [place] CATH LAB;  Service: Cardiovascular;  Laterality: N/A;  . MULTIPLE TEETH EXTRACTIONS (23)/ FOUR QUADRANT ALVEOLPLASTY/ MANDIBULAR LATERAL EXOSTOSES REDUCTIONS  03-24-2010   CHRONIC PERIODONTITIS  . RADIOACTIVE SEED IMPLANT  02/08/2012   Procedure: RADIOACTIVE SEED IMPLANT;  Surgeon: Franchot Gallo, MD;  Location: [facility];  Service: Urology;  Laterality: N/A;  C-ARM   . SHOULDER SURGERY  1992   LEFT  . TEE WITHOUT CARDIOVERSION N/A 08/14/2016  Procedure: TRANSESOPHAGEAL ECHOCARDIOGRAM (TEE);  Surgeon: Josue Hector, MD;  Location: [facility] ENDOSCOPY;  Service: Cardiovascular;  Laterality: N/A;     Current Outpatient Medications  Medication Sig Dispense Refill  . apixaban (ELIQUIS) 5 MG TABS tablet Take 1 tablet (5 mg total) by mouth 2 (two) times daily. 180 tablet 1  . atorvastatin (LIPITOR) 40 MG tablet Take 1 tablet (40 mg total) by mouth daily. 90 tablet 3  . buPROPion (WELLBUTRIN XL) 150 MG 24 hr tablet one tablet twice daily 180 tablet 1  . Continuous Blood Gluc Receiver (FREESTYLE LIBRE 14 DAY READER) DEVI 1 Device by Does  not apply route daily. Use to check blood sugars every morning and 2 hours after largest meals 1 Device 0  . Continuous Blood Gluc Sensor (FREESTYLE LIBRE 14 DAY SENSOR) MISC 1 each by Does not apply route daily. Use to check blood sugars every morning fasting and 2 hours after largest meal 12 each 2  . dapagliflozin propanediol (FARXIGA) 5 MG TABS tablet Take 5 mg by mouth daily.    . finasteride (PROSCAR) 5 MG tablet Take 5 mg by mouth daily.  11  . gabapentin (NEURONTIN) 300 MG capsule TAKE 1 CAPSULE BY MOUTH IN THE MORNING AND 2 TABLETS AT DINNER TIME 360 capsule 0  . glucose blood (FREESTYLE LITE) test strip Use to check blood sugars every morning fasting 100 each 12  . lisinopril (PRINIVIL,ZESTRIL) 5 MG tablet Take 0.5 tablets (2.5 mg total) by mouth daily. 90 tablet 3  . metFORMIN (GLUCOPHAGE) 1000 MG tablet Take 1 tablet (1,000 mg total) by mouth 2 (two) times daily with a meal. 180 tablet 2  . mirabegron ER (MYRBETRIQ) 25 MG TB24 tablet Take 25 mg by mouth daily.    . Multiple Vitamins-Minerals (MULTIVITAMIN ADULT PO) Take 1 tablet by mouth daily.     . tamsulosin (FLOMAX) 0.4 MG CAPS capsule Take 1 capsule by mouth daily.     No current facility-administered medications for this visit.     Allergies:   Bee venom; Oysters [shellfish allergy]; and Penicillins    Social History:  The patient  reports that he has been smoking cigarettes. He has a 75.00 pack-year smoking history. He has never used smokeless tobacco. He reports that he drinks about 2.0 standard drinks of alcohol per week. He reports that he does not use drugs.   Family History:  The patient's family history includes Cancer in his father; Diabetes in his father.    ROS: All other systems are reviewed and negative. Unless otherwise mentioned in H&P    PHYSICAL EXAM: VS:  BP 116/74   Pulse 67   Ht 5\' 10"  (1.778 m)   Wt 227 lb (103 kg)   BMI 32.57 kg/m  , BMI Body mass index is 32.57 kg/m. GEN: Well nourished, well  developed, in no acute distress  HEENT: normal  Neck: no JVD, carotid bruits, or masses Cardiac: RRR; no murmurs, rubs, or gallops,no edema  Respiratory:  clear to auscultation bilaterally, normal work of breathing GI: soft, nontender, nondistended, + BS MS: no deformity or atrophy  Skin: warm and dry, no rash Neuro:  Strength and sensation are intact Psych: euthymic mood, full affect   EKG: NSR rate of 70 bpm. Prior latera and inferior infarct.   Recent Labs: 03/14/2018: ALT 21; BUN 10; Hemoglobin 15.4; Magnesium 1.7; Platelets 232; Potassium 4.3; Sodium 139; TSH 2.290 07/04/2018: Creatinine, Ser 0.70    Lipid Panel    Component Value Date/Time  CHOL 151 03/14/2018 0836   TRIG 113 03/14/2018 0836   HDL 32 (L) 03/14/2018 0836   CHOLHDL 4.7 03/14/2018 0836   CHOLHDL 6.7 08/12/2016 0048   VLDL 66 (H) 08/12/2016 0048   LDLCALC 96 03/14/2018 0836      Wt Readings from Last 3 Encounters:  08/22/18 227 lb (103 kg)  07/22/18 224 lb (101.6 kg)  06/20/18 236 lb 1.6 oz (107.1 kg)      Other studies Reviewed: Echocardiogram 2018-09-10 Left ventricle: Inferior wall hypokinesis. Systolic function was   mildly reduced. The estimated ejection fraction was in the range   of 45% to 50%. - Mitral valve: Mildly calcified annulus. There was mild   regurgitation. - Left atrium: The atrium was dilated. No evidence of thrombus in   the atrial cavity or appendage. No evidence of thrombus in the   atrial cavity or appendage. - Right atrium: No evidence of thrombus in the atrial cavity or   appendage. - Atrial septum: Marked lipomatious hypertrophy of atrial septum No   obious PFO   Late bubble study positive suggesting possible pulmonary shunt   but not cardiac. There was increased thickness of the septum,   consistent with lipomatous hypertrophy.  ASSESSMENT AND PLAN:  1. Pre-Operative Cardiac Clearance:   Patient examined and chart reviewed as a part of pre-operative protocol  coverage. Given past medical history and time since last visit, based on ACC/AHA guidelines, [name] would be at acceptable risk for the planned procedure without further cardiovascular testing. He will stop Eliquis for 2 days prior to surgical procedure, and restart ASAP after procedure. I have spoken with Daleen Snook from pharmacy to confirm.   2.Atrial Fib:  He remains in NSR. Will continue current regimen.   3. Hypertension: Good BP control, No changes.   4. CAD: Hx of MI, most recent cath revealed patent grafts. Continue current regimen.   08/22/2018, 12:01 PM  Current medicines are reviewed at length with the patient today.    Labs/ tests ordered today include: None will have pre-op labs.   Phill Myron. West Pugh, ANP, AACC   08/22/2018 11:56 AM    [facility] [address], [place], [place] [address] Phone: [phone]; Fax: [phone]

## 2018-08-22 ENCOUNTER — Ambulatory Visit (INDEPENDENT_AMBULATORY_CARE_PROVIDER_SITE_OTHER): Payer: PPO | Admitting: Adult Health

## 2018-08-22 ENCOUNTER — Encounter: Payer: Self-pay | Admitting: Adult Health

## 2018-08-22 ENCOUNTER — Other Ambulatory Visit: Payer: Self-pay

## 2018-08-22 VITALS — BP 116/74 | HR 67 | Ht 70.0 in | Wt 227.0 lb

## 2018-08-22 DIAGNOSIS — Z01810 Encounter for preprocedural cardiovascular examination: Secondary | ICD-10-CM | POA: Diagnosis not present

## 2018-08-22 DIAGNOSIS — I2510 Atherosclerotic heart disease of native coronary artery without angina pectoris: Secondary | ICD-10-CM | POA: Diagnosis not present

## 2018-08-22 DIAGNOSIS — I480 Paroxysmal atrial fibrillation: Secondary | ICD-10-CM

## 2018-08-22 DIAGNOSIS — I10 Essential (primary) hypertension: Secondary | ICD-10-CM

## 2018-08-22 MED ORDER — GABAPENTIN 300 MG PO CAPS: ORAL_CAPSULE | ORAL | 0 refills | Status: DC

## 2018-08-22 NOTE — Patient Instructions (Signed)
Medication Instructions:  OK TO STOP ELIQUIS 5 DAYS PRIOR TO YOUR PROCEDURE AND RESTART ASAP AFTER.  If you need a refill on your cardiac medications before your next appointment, please call your pharmacy.   Special Instructions: OK FOR PROCEDURE Cystoscopy ureteroscopy laser liotripsy W/Dr. Annie Main Dahlstedt/[facility]  Follow-Up: Your physician wants you to follow-up in: [place]..   Thank you for choosing [facility] at [facility]!!

## 2018-08-22 NOTE — Progress Notes (Signed)
Low risk procedure, I agree

## 2018-08-23 ENCOUNTER — Ambulatory Visit (INDEPENDENT_AMBULATORY_CARE_PROVIDER_SITE_OTHER): Payer: PPO | Admitting: *Deleted

## 2018-08-23 DIAGNOSIS — I63411 Cerebral infarction due to embolism of right middle cerebral artery: Secondary | ICD-10-CM | POA: Diagnosis not present

## 2018-08-23 NOTE — Progress Notes (Signed)
CREG, GILMER ([id number]) Visit Report for 08/13/2018 Arrival Information Details Patient Name: [name], [name]. Date of Service: 08/13/2018 9:00 AM Medical Record Number: [id number] Patient Account Number: [id number] Date of Birth/Sex: 15-Dec-1951 (67 y.o. M) Treating RN: Montey Hora Primary Care Keia Rask: BESS, KATY Other Clinician: Referring Kriss Perleberg: BESS, KATY Treating Mariapaula Krist/Extender: STONE III, HOYT Weeks in Treatment: 16 Visit Information History Since Last Visit Added or deleted any medications: No Patient Arrived: Ambulatory Any new allergies or adverse reactions: No Arrival Time: 09:07 Had a fall or experienced change in No Accompanied By: self activities of daily living that may affect Transfer Assistance: None risk of falls: Patient Identification Verified: Yes Signs or symptoms of abuse/neglect since last visito No Secondary Verification Process Completed: Yes Hospitalized since last visit: No Patient Requires Transmission-Based No Implantable device outside of the clinic excluding No Precautions: cellular tissue based products placed in the center Patient Has Alerts: Yes since last visit: Patient Alerts: Type II Has Dressing in Place as Prescribed: Yes Diabetic Has Compression in Place as Prescribed: Yes Pain Present Now: No Electronic Signature(s) Signed: 08/13/2018 5:07:08 PM By: Montey Hora Entered By: Montey Hora on 08/13/2018 09:07:57 Kiger, Huntley Dec ([id number]) -------------------------------------------------------------------------------- Encounter Discharge Information Details Patient Name: Marcus Loll T. Date of Service: 08/13/2018 9:00 AM Medical Record Number: [id number] Patient Account Number: [id number] Date of Birth/Sex: 1951/08/15 (67 y.o. M) Treating RN: Roger Shelter Primary Care Danisha Brassfield: BESS, KATY Other Clinician: Referring Breindel Collier: BESS, KATY Treating Shanira Tine/Extender: Melburn Hake, HOYT Weeks in Treatment:  16 Encounter Discharge Information Items Discharge Condition: Stable Ambulatory Status: Ambulatory Discharge Destination: Home Transportation: Private Auto Schedule Follow-up Appointment: Yes Clinical Summary of Care: Electronic Signature(s) Signed: 08/13/2018 4:53:55 PM By: Roger Shelter Entered By: Roger Shelter on 08/13/2018 09:52:59 Boesel, Huntley Dec ([id number]) -------------------------------------------------------------------------------- Lower Extremity Assessment Details Patient Name: Marcus Loll T. Date of Service: 08/13/2018 9:00 AM Medical Record Number: [id number] Patient Account Number: [id number] Date of Birth/Sex: 21-Aug-1951 (67 y.o. M) Treating RN: Montey Hora Primary Care Sly Parlee: BESS, KATY Other Clinician: Referring Auria Mckinlay: BESS, KATY Treating Talissa Apple/Extender: STONE III, HOYT Weeks in Treatment: 16 Edema Assessment Assessed: [Left: No] [Right: No] [Left: Edema] [Right: :] Calf Left: Right: Point of Measurement: 36 cm From Medial Instep 38.6 cm cm Ankle Left: Right: Point of Measurement: 11 cm From Medial Instep 24.5 cm cm Vascular Assessment Pulses: Dorsalis Pedis Palpable: [Left:Yes] Posterior Tibial Extremity colors, hair growth, and conditions: Extremity Color: [Left:Hyperpigmented] Hair Growth on Extremity: [Left:Yes] Temperature of Extremity: [Left:Warm] Capillary Refill: [Left:< 3 seconds] Toe Nail Assessment Left: Right: Thick: Yes Discolored: Yes Deformed: Yes Improper Length and Hygiene: Yes Electronic Signature(s) Signed: 08/13/2018 5:07:08 PM By: Montey Hora Entered By: Montey Hora on 08/13/2018 09:15:32 Cowie, Huntley Dec ([id number]) -------------------------------------------------------------------------------- Multi Wound Chart Details Patient Name: Marcus Loll T. Date of Service: 08/13/2018 9:00 AM Medical Record Number: [id number] Patient Account Number: [id number] Date of Birth/Sex: [birth year] (67 y.o.  M) Treating RN: Ahmed Prima Primary Care Houa Nie: BESS, KATY Other Clinician: Referring Torin Whisner: BESS, KATY Treating Carline Dura/Extender: STONE III, HOYT Weeks in Treatment: 16 Vital Signs Height(in): 79 Pulse(bpm): 70 Weight(lbs): 238 Blood Pressure(mmHg): 127/71 Body Mass Index(BMI): 35 Temperature(F): 97.8 Respiratory Rate 18 (breaths/min): Photos: [2:No Photos] [3:No Photos] [N/A:N/A] Wound Location: [2:Left Toe Great] [3:Left, Dorsal Foot] [N/A:N/A] Wounding Event: [2:Trauma] [3:Gradually Appeared] [N/A:N/A] Primary Etiology: [2:Diabetic Wound/Ulcer of the Lower Extremity] [3:Diabetic Wound/Ulcer of the Lower Extremity] [N/A:N/A] Secondary Etiology: [2:Trauma, Other] [3:N/A] [N/A:N/A] Comorbid History: [2:Lymphedema, Arrhythmia, Coronary Artery Disease, Hypertension, Myocardial Infarction, Type II Diabetes, Neuropathy, Received  Chemotherapy, Received Radiation] [3:N/A] [N/A:N/A] Date Acquired: [2:12/25/2017] [3:07/30/2018] [N/A:N/A] Weeks of Treatment: [2:16] [3:2] [N/A:N/A] Wound Status: [2:Open] [3:Healed - Epithelialized] [N/A:N/A] Pending Amputation on [2:Yes] [3:No] [N/A:N/A] Presentation: Measurements L x W x D [2:0.2x0.3x0.5] [3:0x0x0] [N/A:N/A] (cm) Area (cm) : [2:0.047] [3:0] [N/A:N/A] Volume (cm) : [2:0.024] [3:0] [N/A:N/A] % Reduction in Area: [2:0.00%] [3:100.00%] [N/A:N/A] % Reduction in Volume: [2:-380.00%] [3:100.00%] [N/A:N/A] Starting Position 1 [2:1] (o'clock): Ending Position 1 [2:1] (o'clock): Maximum Distance 1 (cm): [2:0.3] Undermining: [2:Yes] [3:N/A] [N/A:N/A] Classification: [2:Grade 1] [3:Grade 1] [N/A:N/A] Exudate Amount: [2:Small] [3:N/A] [N/A:N/A] Exudate Type: [2:Serous] [3:N/A] [N/A:N/A] Exudate Color: [2:amber] [3:N/A] [N/A:N/A] Wound Margin: [2:Flat and Intact] [3:N/A] [N/A:N/A] Granulation Amount: [2:Medium (34-66%)] [3:N/A] [N/A:N/A] Granulation Quality: [2:Pink] [3:N/A] [N/A:N/A] Necrotic Amount: Medium (34-66%) N/A  N/A Exposed Structures: Fat Layer (Subcutaneous N/A N/A Tissue) Exposed: Yes Fascia: No Tendon: No Muscle: No Joint: No Bone: No Epithelialization: Medium (34-66%) N/A N/A Periwound Skin Texture: Excoriation: No No Abnormalities Noted N/A Induration: No Callus: No Crepitus: No Rash: No Scarring: No Periwound Skin Moisture: Maceration: No No Abnormalities Noted N/A Dry/Scaly: No Periwound Skin Color: Atrophie Blanche: No No Abnormalities Noted N/A Cyanosis: No Ecchymosis: No Erythema: No Hemosiderin Staining: No Mottled: No Pallor: No Rubor: No Temperature: No Abnormality N/A N/A Tenderness on Palpation: Yes No N/A Wound Preparation: Ulcer Cleansing: N/A N/A Rinsed/Irrigated with Saline, Other: soap and water Topical Anesthetic Applied: Other: lidocaine 4% Treatment Notes Electronic Signature(s) Signed: 08/14/2018 4:43:18 PM By: Alric Quan Entered By: Alric Quan on 08/13/2018 09:26:55 Karam, Huntley Dec ([id number]) -------------------------------------------------------------------------------- [place] Details Patient Name: Marcus Lark. Date of Service: 08/13/2018 9:00 AM Medical Record Number: [id number] Patient Account Number: [id number] Date of Birth/Sex: 12/15/1951 (67 y.o. M) Treating RN: Ahmed Prima Primary Care Joshalyn Ancheta: BESS, KATY Other Clinician: Referring Davison Ohms: BESS, KATY Treating Viola Kinnick/Extender: STONE III, HOYT Weeks in Treatment: 16 Active Inactive ` Abuse / Safety / Falls / Self Care Management Nursing Diagnoses: Potential for falls Goals: Patient will remain injury free related to falls Date Initiated: 04/19/2018 Target Resolution Date: 05/04/2018 Goal Status: Active Interventions: Assess fall risk on admission and as needed Notes: ` Nutrition Nursing Diagnoses: Impaired glucose control: actual or potential Goals: Patient/caregiver agrees to and verbalizes understanding of need to use  nutritional supplements and/or vitamins as prescribed Date Initiated: 04/19/2018 Target Resolution Date: 06/07/2018 Goal Status: Active Patient/caregiver verbalizes understanding of need to maintain therapeutic glucose control per primary care physician Date Initiated: 04/19/2018 Target Resolution Date: 05/31/2018 Goal Status: Active Interventions: Assess HgA1c results as ordered upon admission and as needed Assess patient nutrition upon admission and as needed per policy Notes: ` Orientation to the Wound Care Program Nursing Diagnoses: Knowledge deficit related to the wound healing center program Goals: Patient/caregiver will verbalize understanding of the [place] DURANT, SCIBILIA ([id number]) Date Initiated: 04/19/2018 Target Resolution Date: 06/29/2018 Goal Status: Active Interventions: Provide education on orientation to the wound center Notes: ` Wound/Skin Impairment Nursing Diagnoses: Impaired tissue integrity Goals: Ulcer/skin breakdown will heal within 14 weeks Date Initiated: 04/19/2018 Target Resolution Date: 06/29/2018 Goal Status: Active Interventions: Assess patient/caregiver ability to obtain necessary supplies Assess patient/caregiver ability to perform ulcer/skin care regimen upon admission and as needed Assess ulceration(s) every visit Notes: Electronic Signature(s) Signed: 08/14/2018 4:43:18 PM By: Alric Quan Entered By: Alric Quan on 08/13/2018 09:26:49 Luse, Huntley Dec ([id number]) -------------------------------------------------------------------------------- Pain Assessment Details Patient Name: Marcus Loll T. Date of Service: 08/13/2018 9:00 AM Medical Record Number: [id number] Patient Account Number: [id number] Date of Birth/Sex: 1951-03-31 (  67 y.o. M) Treating RN: Montey Hora Primary Care Eulamae Greenstein: BESS, Valetta Fuller Other Clinician: Referring Arpan Eskelson: BESS, KATY Treating Kassity Woodson/Extender: STONE III, HOYT Weeks in  Treatment: 16 Active Problems Location of Pain Severity and Description of Pain Patient Has Paino No Site Locations Pain Management and Medication Current Pain Management: Electronic Signature(s) Signed: 08/13/2018 5:07:08 PM By: Montey Hora Entered By: Montey Hora on 08/13/2018 09:08:03 Cuny, Huntley Dec ([id number]) -------------------------------------------------------------------------------- Patient/Caregiver Education Details Patient Name: Marcus Lark. Date of Service: 08/13/2018 9:00 AM Medical Record Number: [id number] Patient Account Number: [id number] Date of Birth/Gender: Sep 09, 1951 (67 y.o. M) Treating RN: Roger Shelter Primary Care Physician: BESS, KATY Other Clinician: Referring Physician: Lillard Anes Treating Physician/Extender: Melburn Hake, HOYT Weeks in Treatment: 16 Education Assessment Education Provided To: Patient Education Topics Provided Wound Debridement: Handouts: Wound Debridement Methods: Explain/Verbal Responses: State content correctly Wound/Skin Impairment: Handouts: Caring for Your Ulcer Methods: Explain/Verbal Responses: State content correctly Electronic Signature(s) Signed: 08/13/2018 4:53:55 PM By: Roger Shelter Entered By: Roger Shelter on 08/13/2018 09:53:14 Citron, Huntley Dec ([id number]) -------------------------------------------------------------------------------- Wound Assessment Details Patient Name: Marcus Loll T. Date of Service: 08/13/2018 9:00 AM Medical Record Number: [id number] Patient Account Number: [id number] Date of Birth/Sex: 1951/03/22 (67 y.o. M) Treating RN: Montey Hora Primary Care Geanine Vandekamp: BESS, KATY Other Clinician: Referring Blessyn [place]: BESS, KATY Treating Jolan Mealor/Extender: STONE III, HOYT Weeks in Treatment: 16 Wound Status Wound Number: 2 Primary Diabetic Wound/Ulcer of the Lower Extremity Etiology: Wound Location: Left Toe Great Secondary Trauma, Other Wounding Event:  Trauma Etiology: Date Acquired: 12/25/2017 Wound Open Weeks Of Treatment: 16 Status: Clustered Wound: No Comorbid Lymphedema, Arrhythmia, Coronary Artery Pending Amputation On Presentation History: Disease, Hypertension, Myocardial Infarction, Type II Diabetes, Neuropathy, Received Chemotherapy, Received Radiation Photos Photo Uploaded By: Montey Hora on 08/13/2018 11:28:34 Wound Measurements Length: (cm) 0.2 Width: (cm) 0.3 Depth: (cm) 0.5 Area: (cm) 0.047 Volume: (cm) 0.024 % Reduction in Area: 0% % Reduction in Volume: -380% Epithelialization: Medium (34-66%) Tunneling: No Undermining: Yes Starting Position (o'clock): 1 Ending Position (o'clock): 1 Maximum Distance: (cm) 0.3 Wound Description Classification: Grade 1 Foul O Wound Margin: Flat and Intact Slough Exudate Amount: Small Exudate Type: Serous Exudate Color: amber dor After Cleansing: No /Fibrino Yes Wound Bed Granulation Amount: Medium (34-66%) Exposed Structure Granulation Quality: Pink Fascia Exposed: No Hildebrandt, Avyay T. ([id number]) Necrotic Amount: Medium (34-66%) Fat Layer (Subcutaneous Tissue) Exposed: Yes Necrotic Quality: Adherent Slough Tendon Exposed: No Muscle Exposed: No Joint Exposed: No Bone Exposed: No Periwound Skin Texture Texture Color No Abnormalities Noted: No No Abnormalities Noted: No Callus: No Atrophie Blanche: No Crepitus: No Cyanosis: No Excoriation: No Ecchymosis: No Induration: No Erythema: No Rash: No Hemosiderin Staining: No Scarring: No Mottled: No Pallor: No Moisture Rubor: No No Abnormalities Noted: No Dry / Scaly: No Temperature / Pain Maceration: No Temperature: No Abnormality Tenderness on Palpation: Yes Wound Preparation Ulcer Cleansing: Rinsed/Irrigated with Saline, Other: soap and water, Topical Anesthetic Applied: Other: lidocaine 4%, Treatment Notes Wound #2 (Left Toe Great) 1. Cleansed with: Clean wound with Normal Saline 2.  Anesthetic Topical Lidocaine 4% cream to wound bed prior to debridement 4. Dressing Applied: Aquacel Ag Prisma Ag 5. Secondary Dressing Applied Dry Gauze Foam Notes conform wrap with tape, off loading shoe Electronic Signature(s) Signed: 08/13/2018 5:07:08 PM By: Montey Hora Entered By: Montey Hora on 08/13/2018 09:14:34 Utke, Huntley Dec ([id number]) -------------------------------------------------------------------------------- Wound Assessment Details Patient Name: Marcus Loll T. Date of Service: 08/13/2018 9:00 AM Medical Record Number: [id number] Patient Account Number: [id number] Date of Birth/Sex: 03/14/1951 (67 y.o.  M) Treating RN: Montey Hora Primary Care Archita Lomeli: BESS, KATY Other Clinician: Referring Lilliane Sposito: BESS, KATY Treating Montserrat Shek/Extender: STONE III, HOYT Weeks in Treatment: 16 Wound Status Wound Number: 3 Primary Diabetic Wound/Ulcer of the Lower Etiology: Extremity Wound Location: Left, Dorsal Foot Wound Status: Healed - Epithelialized Wounding Event: Gradually Appeared Date Acquired: 07/30/2018 Weeks Of Treatment: 2 Clustered Wound: No Photos Photo Uploaded By: Montey Hora on 08/13/2018 11:28:35 Wound Measurements Length: (cm) 0 % Width: (cm) 0 % Depth: (cm) 0 Area: (cm) 0 Volume: (cm) 0 Reduction in Area: 100% Reduction in Volume: 100% Wound Description Classification: Grade 1 Periwound Skin Texture Texture Color No Abnormalities Noted: No No Abnormalities Noted: No Moisture No Abnormalities Noted: No Electronic Signature(s) Signed: 08/13/2018 5:07:08 PM By: Montey Hora Entered By: Montey Hora on 08/13/2018 09:14:07 Batchelder, Huntley Dec ([id number]) -------------------------------------------------------------------------------- [place] Details Patient Name: Marcus Lark. Date of Service: 08/13/2018 9:00 AM Medical Record Number: [id number] Patient Account Number: [id number] Date of Birth/Sex: [birth year] (67 y.o.  M) Treating RN: Montey Hora Primary Care Dewayne Jurek: BESS, KATY Other Clinician: Referring Anniyah Mood: BESS, KATY Treating Naol Ontiveros/Extender: STONE III, HOYT Weeks in Treatment: 16 Vital Signs Time Taken: 09:08 Temperature (F): 97.8 Height (in): 69 Pulse (bpm): 70 Weight (lbs): 238 Respiratory Rate (breaths/min): 18 Body Mass Index (BMI): 35.1 Blood Pressure (mmHg): 127/71 Reference Range: 80 - 120 mg / dl Electronic Signature(s) Signed: 08/13/2018 5:07:08 PM By: Montey Hora Entered By: Montey Hora on 08/13/2018 09:08:43

## 2018-08-23 NOTE — Progress Notes (Signed)
[name] ([id number]) Visit Report for 08/13/2018 Chief Complaint Document Details Patient Name: [name]. Date of Service: 08/13/2018 9:00 AM Medical Record Number: [id number] Patient Account Number: [id number] Date of Birth/Sex: 1951/11/24 (67 y.o. M) Treating RN: Ahmed Prima Primary Care Provider: BESS, Valetta Fuller Other Clinician: Referring Provider: BESS, KATY Treating Provider/Extender: STONE III,  Weeks in Treatment: 16 Information Obtained from: Patient Chief Complaint Left lower leg and left great toe ulcer Electronic Signature(s) Signed: 08/14/2018 12:11:53 PM By: Worthy Keeler PA-C Entered By: Worthy Keeler on 08/13/2018 09:21:20 [name], [name] ([id number]) -------------------------------------------------------------------------------- Debridement Details Patient Name: [name]. Date of Service: 08/13/2018 9:00 AM Medical Record Number: [id number] Patient Account Number: [id number] Date of Birth/Sex: [birth year] (67 y.o. M) Treating RN: Ahmed Prima Primary Care Provider: BESS, KATY Other Clinician: Referring Provider: BESS, KATY Treating Provider/Extender: STONE III,  Weeks in Treatment: 16 Debridement Performed for Wound #2 Left Toe Great Assessment: Performed By: Physician STONE III,  E., PA-C Debridement Type: Debridement Severity of Tissue Pre Fat layer exposed Debridement: Pre-procedure Verification/Time Yes - 09:26 Out Taken: Start Time: 09:26 Pain Control: Other : lidocaine 4% Total Area Debrided (L x W): 0.2 (cm) x 0.3 (cm) = 0.06 (cm) Tissue and other material Viable, Non-Viable, Callus, Slough, Subcutaneous, Fibrin/Exudate, Slough debrided: Level: Skin/Subcutaneous Tissue Debridement Description: Excisional Instrument: Curette Bleeding: Minimum Hemostasis Achieved: Pressure End Time: 09:35 Procedural Pain: 0 Post Procedural Pain: 0 Response to Treatment: Procedure was tolerated well Level of  Consciousness: Awake and Alert Post Debridement Measurements of Total Wound Length: (cm) 0.3 Width: (cm) 0.3 Depth: (cm) 0.4 Volume: (cm) 0.028 Character of Wound/Ulcer Post Debridement: Requires Further Debridement Severity of Tissue Post Debridement: Fat layer exposed Post Procedure Diagnosis Same as Pre-procedure Electronic Signature(s) Signed: 08/14/2018 12:11:53 PM By: Worthy Keeler PA-C Signed: 08/14/2018 4:43:18 PM By: Alric Quan Entered By: Worthy Keeler on 08/13/2018 09:49:48 [name] ([id number]) -------------------------------------------------------------------------------- HPI Details Patient Name: Marcus Loll T. Date of Service: 08/13/2018 9:00 AM Medical Record Number: [id number] Patient Account Number: [id number] Date of Birth/Sex: 09-30-1951 (67 y.o. M) Treating RN: Ahmed Prima Primary Care Provider: BESS, KATY Other Clinician: Referring Provider: BESS, KATY Treating Provider/Extender: STONE III,  Weeks in Treatment: 16 History of Present Illness Associated Signs and Symptoms: Patient has a history of chronic venous insufficiency, diabetes mellitus type II, its reform fraction, hypertension HPI Description: 04/19/18 on evaluation today patient presents initially concerning an ulcer of his left great toe and left anterior shin. He states that the shin was definitely a trauma injury where he struck this on a metal pole injuring leg. In regard to the distal left great toe he's really not sure what happened here although he does have a significant callous noted as well. He does have a pacemaker. Fortunately his ABI was 0.98 on the right and 0.83 on the left this appears to be doing fairly well. Overall I'm pleased with that. Nonetheless he states that this really has not seem to be improving. He has been tolerating the dressing changes without complication. The left anterior leg ulcer began on 04/05/18 according to what he tells [place]  today although one note I did have for review said it was April 5. I'm inclined to believe it was the fifth she did have an office visit on 04/01/18 with his family nurse practitioner Faith Rogue. Nonetheless either way it's been going on this month for several weeks. The left great toe has actually been present since January 1 he does  not know how this occurred. Fortunately has no pain at the site although he does have pain on the left anterior shin. His hemoglobin A1c is 8.0 in March 2019. 04/26/18 on evaluation today patient presents for follow-up concerning his life into your [place] also as well as his left great toe also. Fortunately the shin actually appears to be doing better on evaluation today in my opinion I'm not seeing as much in the way of fluffernutter on the surface although it does still require debridement today this is improved. Size wise it's really not much different. With that being said the total ulcer actually appears to be a little bit more macerated today I'm not sure why they state that several days ago when this was changed that was not the case. Fortunately is not having severe pain although both areas do hurt the shin is worse. 05/03/18 on evaluation today patient appears to be doing better in regard to his left first toe and left shin ulcers. He is been tolerating the dressing changes without complication and the good news is this seems to be showing signs of getting better. Overall I'm very pleased with the progress that has been made over the past week. Patient is still having pain especially in regard to the left shin. 05/10/18 on evaluation today patient appears to be doing rather well in regard to his two ulcers. The toe ulcer appears to be smaller he does have some undermining however in this had to be cleaned out just a little bit. With that being said overall I feel like this is showing signs of improvement. The left anterior shin ulcer also showing signs of  improvement at this time. There does not appear to be any evidence of infection which is good news. 05/21/18 on evaluation today patient appears to be doing very well in regard to his left shin ulcer. He has been tolerating the dressing changes without complication. With that being said he is having issues at this point with his great toe on the left. We have not x-rayed that at this point I think we may need to. Nonetheless he has pain really at the roughly 5 o'clock location but nowhere else and I really cannot explain this I do not see any obvious form body. Nonetheless he continues to have issues with getting this area to close it's not progressing as nicely. 05/28/18 on evaluation today patient appears to be doing very well in regard to his left lower extremity anterior ulcer. He has been tolerating the dressing changes without complication. Overall this is making excellent progress with the collagen dressing. His left great toe x-ray did return and showed that he had no if you that normality. He did have a plantar heel spur but this is more degenerative. Otherwise just arthritis was noted no osteomyelitis and no obvious form body was visualized. Overall things seem to be going fairly well. With that being said he still has pain when looking at the toe from the plantar aspect at roughly the six back to the 4 o'clock location which is not quite as bad as last time but still hurts more than any other part of his wound. It does appear the Iodoflex has been of benefit however. 06/04/18 on evaluation today patient appears to be doing excellent in regard to his left anterior shin ulcer. This has made excellent progress and is showing signs of improving in fact very close to completely closing in healing. Nonetheless unfortunately his left great toe has  not faired quite as well as far as progress is concerned. He notes that after last week's Farooq, Marcus SUNGA. ([id number]) debridement he actually seem to  be doing a little bit better for a couple of days until Thursday of last week when he had increases in discomfort as well is changes in the draining. He states that the drainage has been more green in color according to his wife and what he has seen. He's also had more discomfort. He relates to me again that he really feels like this all started when he stepped on a nail although he does not exactly remember it it seemed to be a puncture wound that he feels may be the culprit for why all this started. Nonetheless I am getting more concerned about the possibility of osteomyelitis despite the fact that the x-ray was negative I was hopeful in this interim since last week to this week that we would see some improvement in the overall appearance of the toe. Unfortunately if anything I feel like it's a little worse. It was macerated but I think we can attribute this to the fact that the patient did have an episode last night we got stuck in the rain and he thinks the dressing got wet. He did not change it I told him in the future if this were to happen to please go ahead and change the dressing it won't hurt to change it more frequently in that situation. Otherwise other than the maceration I do not see any evidence of anything being any worse but it also does not appear to be any better. 06/11/18 on evaluation today patient's wounds both the great toe as well as the left shin both on the left side show signs of good improvement. Fortunately the patient's culture came back negative in regard to the toe and there does not appear to be any evidence of infection. His MRI is actually scheduled for 06/19/18. Fortunately this is right before I see him next week and then we can go to the results of the culture at that point. Patient is in agreement with that plan. With that being said otherwise he has been showing signs of having less pain in regard to the toe which is also good news my hope is that he does not  have osteomyelitis. 06/18/18 on evaluation today patient appears to be doing very well in regard to his left lower extremity ulcer. He also states at this point in time that he has been tolerating the dressing changes with the toe it appears also to be doing very well at this point. In general I'm happy with the progress that he seems to be making. With that being said he still continues to have pain in regard to the great toe ulcer fortunately this does not seem to be as significant as it was in the beginning but still nonetheless he is having some pain. No fevers, chills, nausea, or vomiting noted at this time. 06/25/18 on evaluation today patient actually appears to be doing very well in regard to his left anterior shin ulcer. He's been tolerating the dressing changes without complication in this seems to have done extremely well. In fact this appears to be healed. With that being said his toe ulcer still continues to get in trouble fortunately not as much as has been in the past and there have been some signs of improvement and better granulation although I still think he's getting a lot of callous to the area which  seems to indicate a lot of pressure and friction. I believe he may benefit from a total contact cast especially now that the left shin ulcer has healed. 07/02/18 on evaluation today patient appears to be doing rather well in regard to his left anterior shin ulcer which is actually completely healed at this point. With that being said he has left great toe ulcer actually appears to be doing better he states over the weekend he actually had a significant amount of drainage from the toe after it had been hurting for some time. He states when his wife change the dressing there was a lot of discharge and fluid he actually took a picture he showed me today. Since that time he has not noted as much tenderness he feels like something may have "popped out" 07/09/18 on evaluation today patient  appears to be doing about the same in regard to his toe ulcer. He really has not shown any signs of significant improvement this seems to just be maintaining at best. He continues to be extremely active in fact I think this may be the biggest issue that he continues to do things as normal in fact right now he's remodeling the bathroom. Obviously I think he is a very active individual and as previously discussed I think a total contact cast would be of great benefit for him. This was discussed with patient yet again today I think we are gonna proceed in this regard. 07/12/18 patient presents today for his first total contact cast change. He has had this on for three days and states that though he felt like it may be rubbing a little bit around his ankles there does not appear to be any injury at this point. Overall the wound actually looks better in my pinion. 07/16/18 on evaluation today patient's wound actually appears to be doing much better at this point. He has been tolerating the dressing changes without complication. With that being said I do believe the total contact cast has been of benefit he states he is having much less discomfort that he previously had. Overall I'm very pleased with how things have progressed in just one weeks worth of the total contact cast. 07/23/18 on evaluation today patient's toe ulcer on the left great toe actually appears to be showing signs of improvement which is good news. In general I'm very pleased with the progress that he has made in the cast over the past two weeks. Fortunately there does not appear to be any evidence of infection at this time. He also is not having as much maceration as was previously noted. The patient is happy in this regard that he notes that the boot and cast is not the most comfortable thing in the world. 07/30/18-He is here in follow-up evaluation for left great toe ulcer. He presents in a total contact cast, after removal he is  noted to be macerated with new injury to the dorsum of his left foot; he continues to work outside and this is most likely secondary to [name], Marcus T. ([id number]) excessive sweating versus wet from a shower. We will hold off on reapplying total contact cast, allowing the dorsal foot injury to resolve and allowing for complete drying as he will continue to work outside. He will have a nurse visit next Monday to evaluate dorsal foot partial thickness ulcer and moisture/maceration to foot and follow-up in 2 weeks 08/13/18 on evaluation today patient's toe ulcer actually appears to be doing about the same. Fortunately the  dorsal foot ulcer has healed and his foot is not appearing to be as macerated. He continues to be overly active in my pinion in regard to the remodeling of his home he states he was actually finishing up some plumbing yesterday. Nonetheless I think that right now being outside the majority the day in a total contact cast in roughly 90 to 100o weather has been counterproductive not helpful for them in regard to healing the wound. I explained to him that if we're gonna get this to heal he may need to drop back a bit and take it easy to allow this to heal. He states that's not something is gonna likely be able to do. Electronic Signature(s) Signed: 08/14/2018 12:11:53 PM By: Worthy Keeler PA-C Entered By: Worthy Keeler on 08/13/2018 09:47:05 [name], [name] ([id number]) -------------------------------------------------------------------------------- Physical Exam Details Patient Name: Marcus Loll T. Date of Service: 08/13/2018 9:00 AM Medical Record Number: [id number] Patient Account Number: [id number] Date of Birth/Sex: [birth year] (67 y.o. M) Treating RN: Ahmed Prima Primary Care Provider: BESS, KATY Other Clinician: Referring Provider: BESS, KATY Treating Provider/Extender: STONE III,  Weeks in Treatment: 28 Constitutional Well-nourished and well-hydrated in no  acute distress. Respiratory normal breathing without difficulty. Psychiatric this patient is able to make decisions and demonstrates good insight into disease process. Alert and Oriented x 3. pleasant and cooperative. Notes Patient's wound did have some evidence of callous overlying the surface of the wound was also a thin layer of epithelium trying to cover over the opening of the wound although it was now collecting fluid deeper in. This was gently and carefully removed obviously we do not want to trying to cover over until the wound itself feels and from the base. I can still probe bone in the base of the wound again his MRI was negative for osteomyelitis. The bone does not appear to be fragile at this point but being exposed it does make me concerned for the possibility of developing osteomyelitis. Electronic Signature(s) Signed: 08/14/2018 12:11:53 PM By: Worthy Keeler PA-C Entered By: Worthy Keeler on 08/13/2018 09:47:46 [name], [name] ([id number]) -------------------------------------------------------------------------------- Physician Orders Details Patient Name: [name]. Date of Service: 08/13/2018 9:00 AM Medical Record Number: [id number] Patient Account Number: [id number] Date of Birth/Sex: 1951-08-22 (67 y.o. M) Treating RN: Ahmed Prima Primary Care Provider: BESS, KATY Other Clinician: Referring Provider: BESS, KATY Treating Provider/Extender: STONE III,  Weeks in Treatment: 16 Verbal / Phone Orders: Yes Clinician: Carolyne Fiscal, Debi Read Back and Verified: Yes Diagnosis Coding ICD-10 Coding Code Description L97.522 Non-pressure chronic ulcer of other part of left foot with fat layer exposed L97.521 Non-pressure chronic ulcer of other part of left foot limited to breakdown of skin I87.2 Venous insufficiency (chronic) (peripheral) E11.621 Type 2 diabetes mellitus with foot ulcer I63.9 Cerebral infarction, unspecified I10 Essential (primary)  hypertension Wound Cleansing Wound #2 Left Toe Great o Clean wound with Normal Saline. o Cleanse wound with mild soap and water Anesthetic (add to Medication List) Wound #2 Left Toe Great o Topical Lidocaine 4% cream applied to wound bed prior to debridement (In Clinic Only). Primary Wound Dressing Wound #2 Left Toe Great o Silver Alginate Secondary Dressing Wound #2 Left Toe Great o Gauze and Kerlix/Conform - secure with coban o Foam Dressing Change Frequency Wound #2 Left Toe Great o Change dressing every other day. Follow-up Appointments Wound #2 Left Toe Great o Return Appointment in 1 week. o Nurse Visit as needed Off-Loading Wound #2 Left Toe Great o  Open toe surgical shoe to: - Darco front offlloader o Other: - keep pressure off of area [name], [name] ([id number]) Additional Orders / Instructions Wound #2 Left Toe Great o Stop Smoking o Vitamin A; Vitamin C, Zinc - Please add a multivitamin with 100% of these o Increase protein intake. o Activity as tolerated Patient Medications Allergies: bee venom protein (honey bee), penicillin, oyster shell Notifications Medication Indication Start End lidocaine DOSE 1 - topical 4 % cream - 1 cream topical Electronic Signature(s) Signed: 08/14/2018 12:11:53 PM By: Worthy Keeler PA-C Signed: 08/14/2018 4:43:18 PM By: Alric Quan Entered By: Alric Quan on 08/13/2018 10:06:30 [name], [name] ([id number]) -------------------------------------------------------------------------------- Prescription 08/13/2018 Patient Name: Marcus Loll T. Provider: Worthy Keeler PA-C Date of Birth: [birth year] NPI#: [id number] Sex: [name] DEA#: [id number] Phone #: [phone] License #: Patient Address: [place], [place] [address] [address] [place], [place]  [address] [phone] Allergies bee venom protein (honey bee) penicillin oyster shell Medication Medication: Route: Strength: Form: lidocaine 4 % topical cream topical 4% cream Class: TOPICAL LOCAL ANESTHETICS Dose: Frequency / Time: Indication: 1 1 cream topical Number of Refills: Number of Units: 0 Generic Substitution: Start Date: End Date: One Time Use: Substitution Permitted No Note to Pharmacy: Signature(s): Date(s): Electronic Signature(s) Signed: 08/14/2018 12:11:53 PM By: Worthy Keeler PA-C Signed: 08/14/2018 4:43:18 PM By: Luanna Salk ([id number]) Entered By: Alric Quan on 08/13/2018 10:06:30 [place], [name] ([id number]) --------------------------------------------------------------------------------  Problem List Details Patient Name: Marcus Loll T. Date of Service: 08/13/2018 9:00 AM Medical Record Number: [id number] Patient Account Number: [id number] Date of Birth/Sex: [birth year] (67 y.o. M) Treating RN: Ahmed Prima Primary Care Provider: BESS, KATY Other Clinician: Referring Provider: BESS, KATY Treating Provider/Extender: Melburn Hake,  Weeks in Treatment: 16 Active Problems ICD-10 Evaluated Encounter Code Description Active Date Today Diagnosis L97.522 Non-pressure chronic ulcer of other part of left foot with fat 04/19/2018 No Yes layer exposed L97.521 Non-pressure chronic ulcer of other part of left foot limited to 07/30/2018 No Yes breakdown of skin I87.2 Venous insufficiency (chronic) (peripheral) 04/19/2018 No Yes E11.621 Type 2 diabetes mellitus with foot ulcer 04/19/2018 No Yes I63.9 Cerebral infarction, unspecified 04/19/2018 No Yes I10 Essential (primary) hypertension 04/19/2018 No Yes Inactive Problems Resolved Problems ICD-10 Code Description Active Date Resolved Date S81.812A Laceration without foreign body, left lower leg, initial encounter 04/19/2018 04/19/2018 Q67.619 Non-pressure chronic ulcer of other  part of left lower leg with fat 04/19/2018 04/19/2018 layer exposed Electronic Signature(s) MIKLE, STERNBERG ([id number]) Signed: 08/14/2018 12:11:53 PM By: Worthy Keeler PA-C Entered By: Worthy Keeler on 08/13/2018 09:21:05 Ra, [name] ([id number]) -------------------------------------------------------------------------------- Progress Note Details Patient Name: Marcus Loll T. Date of Service: 08/13/2018 9:00 AM Medical Record Number: [id number] Patient Account Number: [id number] Date of Birth/Sex: 07/04/1951 (67 y.o. M) Treating RN: Ahmed Prima Primary Care Provider: BESS, KATY Other Clinician: Referring Provider: BESS, KATY Treating Provider/Extender: STONE III,  Weeks in Treatment: 16 Subjective Chief Complaint Information obtained from Patient Left lower leg and left great toe ulcer History of Present Illness (HPI) The following HPI elements were documented for the patient's wound: Associated Signs and Symptoms: Patient has a history of chronic venous insufficiency, diabetes mellitus type II, its reform fraction, hypertension 04/19/18 on evaluation today patient presents initially concerning an ulcer of his left great toe and left anterior shin. He states that the shin was definitely a trauma injury where he  struck this on a metal pole injuring leg. In regard to the distal left great toe he's really not sure what happened here although he does have a significant callous noted as well. He does have a pacemaker. Fortunately his ABI was 0.98 on the right and 0.83 on the left this appears to be doing fairly well. Overall I'm pleased with that. Nonetheless he states that this really has not seem to be improving. He has been tolerating the dressing changes without complication. The left anterior leg ulcer began on 04/05/18 according to what he tells [place] today although one note I did have for review said it was April 5. I'm inclined to believe it was the fifth she did have  an office visit on 04/01/18 with his family nurse practitioner Faith Rogue. Nonetheless either way it's been going on this month for several weeks. The left great toe has actually been present since January 1 he does not know how this occurred. Fortunately has no pain at the site although he does have pain on the left anterior shin. His hemoglobin A1c is 8.0 in March 2019. 04/26/18 on evaluation today patient presents for follow-up concerning his life into your [place] also as well as his left great toe also. Fortunately the shin actually appears to be doing better on evaluation today in my opinion I'm not seeing as much in the way of fluffernutter on the surface although it does still require debridement today this is improved. Size wise it's really not much different. With that being said the total ulcer actually appears to be a little bit more macerated today I'm not sure why they state that several days ago when this was changed that was not the case. Fortunately is not having severe pain although both areas do hurt the shin is worse. 05/03/18 on evaluation today patient appears to be doing better in regard to his left first toe and left shin ulcers. He is been tolerating the dressing changes without complication and the good news is this seems to be showing signs of getting better. Overall I'm very pleased with the progress that has been made over the past week. Patient is still having pain especially in regard to the left shin. 05/10/18 on evaluation today patient appears to be doing rather well in regard to his two ulcers. The toe ulcer appears to be smaller he does have some undermining however in this had to be cleaned out just a little bit. With that being said overall I feel like this is showing signs of improvement. The left anterior shin ulcer also showing signs of improvement at this time. There does not appear to be any evidence of infection which is good news. 05/21/18 on  evaluation today patient appears to be doing very well in regard to his left shin ulcer. He has been tolerating the dressing changes without complication. With that being said he is having issues at this point with his great toe on the left. We have not x-rayed that at this point I think we may need to. Nonetheless he has pain really at the roughly 5 o'clock location but nowhere else and I really cannot explain this I do not see any obvious form body. Nonetheless he continues to have issues with getting this area to close it's not progressing as nicely. 05/28/18 on evaluation today patient appears to be doing very well in regard to his left lower extremity anterior ulcer. He has been tolerating the dressing changes without complication. Overall this  is making excellent progress with the collagen dressing. His left great toe x-ray did return and showed that he had no if you that normality. He did have a plantar heel spur [name], Marcus T. ([id number]) but this is more degenerative. Otherwise just arthritis was noted no osteomyelitis and no obvious form body was visualized. Overall things seem to be going fairly well. With that being said he still has pain when looking at the toe from the plantar aspect at roughly the six back to the 4 o'clock location which is not quite as bad as last time but still hurts more than any other part of his wound. It does appear the Iodoflex has been of benefit however. 06/04/18 on evaluation today patient appears to be doing excellent in regard to his left anterior shin ulcer. This has made excellent progress and is showing signs of improving in fact very close to completely closing in healing. Nonetheless unfortunately his left great toe has not faired quite as well as far as progress is concerned. He notes that after last week's debridement he actually seem to be doing a little bit better for a couple of days until Thursday of last week when he had increases in discomfort  as well is changes in the draining. He states that the drainage has been more green in color according to his wife and what he has seen. He's also had more discomfort. He relates to me again that he really feels like this all started when he stepped on a nail although he does not exactly remember it it seemed to be a puncture wound that he feels may be the culprit for why all this started. Nonetheless I am getting more concerned about the possibility of osteomyelitis despite the fact that the x-ray was negative I was hopeful in this interim since last week to this week that we would see some improvement in the overall appearance of the toe. Unfortunately if anything I feel like it's a little worse. It was macerated but I think we can attribute this to the fact that the patient did have an episode last night we got stuck in the rain and he thinks the dressing got wet. He did not change it I told him in the future if this were to happen to please go ahead and change the dressing it won't hurt to change it more frequently in that situation. Otherwise other than the maceration I do not see any evidence of anything being any worse but it also does not appear to be any better. 06/11/18 on evaluation today patient's wounds both the great toe as well as the left shin both on the left side show signs of good improvement. Fortunately the patient's culture came back negative in regard to the toe and there does not appear to be any evidence of infection. His MRI is actually scheduled for 06/19/18. Fortunately this is right before I see him next week and then we can go to the results of the culture at that point. Patient is in agreement with that plan. With that being said otherwise he has been showing signs of having less pain in regard to the toe which is also good news my hope is that he does not have osteomyelitis. 06/18/18 on evaluation today patient appears to be doing very well in regard to his left lower  extremity ulcer. He also states at this point in time that he has been tolerating the dressing changes with the toe it appears also to  be doing very well at this point. In general I'm happy with the progress that he seems to be making. With that being said he still continues to have pain in regard to the great toe ulcer fortunately this does not seem to be as significant as it was in the beginning but still nonetheless he is having some pain. No fevers, chills, nausea, or vomiting noted at this time. 06/25/18 on evaluation today patient actually appears to be doing very well in regard to his left anterior shin ulcer. He's been tolerating the dressing changes without complication in this seems to have done extremely well. In fact this appears to be healed. With that being said his toe ulcer still continues to get in trouble fortunately not as much as has been in the past and there have been some signs of improvement and better granulation although I still think he's getting a lot of callous to the area which seems to indicate a lot of pressure and friction. I believe he may benefit from a total contact cast especially now that the left shin ulcer has healed. 07/02/18 on evaluation today patient appears to be doing rather well in regard to his left anterior shin ulcer which is actually completely healed at this point. With that being said he has left great toe ulcer actually appears to be doing better he states over the weekend he actually had a significant amount of drainage from the toe after it had been hurting for some time. He states when his wife change the dressing there was a lot of discharge and fluid he actually took a picture he showed me today. Since that time he has not noted as much tenderness he feels like something may have "popped out" 07/09/18 on evaluation today patient appears to be doing about the same in regard to his toe ulcer. He really has not shown any signs of significant  improvement this seems to just be maintaining at best. He continues to be extremely active in fact I think this may be the biggest issue that he continues to do things as normal in fact right now he's remodeling the bathroom. Obviously I think he is a very active individual and as previously discussed I think a total contact cast would be of great benefit for him. This was discussed with patient yet again today I think we are gonna proceed in this regard. 07/12/18 patient presents today for his first total contact cast change. He has had this on for three days and states that though he felt like it may be rubbing a little bit around his ankles there does not appear to be any injury at this point. Overall the wound actually looks better in my pinion. 07/16/18 on evaluation today patient's wound actually appears to be doing much better at this point. He has been tolerating the dressing changes without complication. With that being said I do believe the total contact cast has been of benefit he states he is having much less discomfort that he previously had. Overall I'm very pleased with how things have progressed in just one weeks worth of the total contact cast. [name], [name] ([id number]) 07/23/18 on evaluation today patient's toe ulcer on the left great toe actually appears to be showing signs of improvement which is good news. In general I'm very pleased with the progress that he has made in the cast over the past two weeks. Fortunately there does not appear to be any evidence of infection at this time.  He also is not having as much maceration as was previously noted. The patient is happy in this regard that he notes that the boot and cast is not the most comfortable thing in the world. 07/30/18-He is here in follow-up evaluation for left great toe ulcer. He presents in a total contact cast, after removal he is noted to be macerated with new injury to the dorsum of his left foot; he continues to  work outside and this is most likely secondary to excessive sweating versus wet from a shower. We will hold off on reapplying total contact cast, allowing the dorsal foot injury to resolve and allowing for complete drying as he will continue to work outside. He will have a nurse visit next Monday to evaluate dorsal foot partial thickness ulcer and moisture/maceration to foot and follow-up in 2 weeks 08/13/18 on evaluation today patient's toe ulcer actually appears to be doing about the same. Fortunately the dorsal foot ulcer has healed and his foot is not appearing to be as macerated. He continues to be overly active in my pinion in regard to the remodeling of his home he states he was actually finishing up some plumbing yesterday. Nonetheless I think that right now being outside the majority the day in a total contact cast in roughly 90 to 100 weather has been counterproductive not helpful for them in regard to healing the wound. I explained to him that if we're gonna get this to heal he may need to drop back a bit and take it easy to allow this to heal. He states that's not something is gonna likely be able to do. Patient History Information obtained from Patient. Family History Cancer - Father, Diabetes - Father, Heart Disease - Mother,Father, Hypertension - Mother, Kidney Disease - Child, No family history of Lung Disease, Seizures, Stroke, Thyroid Problems, Tuberculosis. Social History Current every day smoker - 1 - 1/2 pack daily, Marital Status - Married, Alcohol Use - Rarely, Drug Use - No History, Caffeine Use - Daily - coffee. Review of Systems (ROS) Constitutional Symptoms (General Health) Denies complaints or symptoms of Fever, Chills. Respiratory The patient has no complaints or symptoms. Cardiovascular The patient has no complaints or symptoms. Psychiatric The patient has no complaints or symptoms. Objective Constitutional Well-nourished and well-hydrated in no acute  distress. Vitals Time Taken: 9:08 AM, Height: 69 in, Weight: 238 lbs, BMI: 35.1, Temperature: 97.8 F, Pulse: 70 bpm, Respiratory Rate: 18 breaths/min, Blood Pressure: 127/71 mmHg. Respiratory normal breathing without difficulty. ALECZANDER, FANDINO ([id number]) Psychiatric this patient is able to make decisions and demonstrates good insight into disease process. Alert and Oriented x 3. pleasant and cooperative. General Notes: Patient's wound did have some evidence of callous overlying the surface of the wound was also a thin layer of epithelium trying to cover over the opening of the wound although it was now collecting fluid deeper in. This was gently and carefully removed obviously we do not want to trying to cover over until the wound itself feels and from the base. I can still probe bone in the base of the wound again his MRI was negative for osteomyelitis. The bone does not appear to be fragile at this point but being exposed it does make me concerned for the possibility of developing osteomyelitis. Integumentary (Hair, Skin) Wound #2 status is Open. Original cause of wound was Trauma. The wound is located on the Left Toe Great. The wound measures 0.2cm length x 0.3cm width x 0.5cm depth; 0.047cm^2 area and  0.024cm^3 volume. There is Fat Layer (Subcutaneous Tissue) Exposed exposed. There is no tunneling noted, however, there is undermining starting at 1:00 and ending at 1:00 with a maximum distance of 0.3cm. There is a small amount of serous drainage noted. The wound margin is flat and intact. There is medium (34-66%) pink granulation within the wound bed. There is a medium (34-66%) amount of necrotic tissue within the wound bed including Adherent Slough. The periwound skin appearance did not exhibit: Callus, Crepitus, Excoriation, Induration, Rash, Scarring, Dry/Scaly, Maceration, Atrophie Blanche, Cyanosis, Ecchymosis, Hemosiderin Staining, Mottled, Pallor, Rubor, Erythema. Periwound  temperature was noted as No Abnormality. The periwound has tenderness on palpation. Wound #3 status is Healed - Epithelialized. Original cause of wound was Gradually Appeared. The wound is located on the Left,Dorsal Foot. The wound measures 0cm length x 0cm width x 0cm depth; 0cm^2 area and 0cm^3 volume. 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 There was a Excisional Skin/Subcutaneous Tissue Debridement with a total area of 0.06 sq cm performed by STONE III,  E., PA-C. With the following instrument(s): Curette to remove Viable and Non-Viable tissue/material. Material removed includes Callus, Subcutaneous Tissue, Slough, and Fibrin/Exudate after achieving pain control using Other (lidocaine 4%). No specimens were taken. A time out was conducted at 09:26, prior to the start of the procedure. A Minimum amount of bleeding was controlled with Pressure. The procedure was tolerated well with a pain level of 0 throughout and a pain level of 0 following the procedure. Patient s Level of Consciousness post procedure was recorded as Awake and Alert. Post Debridement Measurements: 0.3cm length x 0.3cm width x 0.4cm depth; 0.028cm^3 volume. [name], KLOSINSKI ([id number]) Character of Wound/Ulcer Post Debridement requires further debridement. Severity of Tissue Post Debridement is: Fat layer exposed. Post procedure Diagnosis Wound #2: Same as Pre-Procedure Plan Wound Cleansing: Wound #2 Left Toe Great: Clean  wound with Normal Saline. Cleanse wound with mild soap and water Anesthetic (add to Medication List): Wound #2 Left Toe Great: Topical Lidocaine 4% cream applied to wound bed prior to debridement (In Clinic Only). Primary Wound Dressing: Wound #2 Left Toe Great: Silver Alginate Secondary Dressing: Wound #2 Left Toe Great: Gauze and Kerlix/Conform - secure with coban Foam Dressing Change Frequency: Wound #2 Left Toe Great: Change dressing every other day. Follow-up Appointments: Wound #2 Left Toe Great: Return Appointment in 1 week. Nurse Visit as needed Off-Loading: Wound #2 Left Toe Great: Open toe surgical shoe to: - Darco front offlloader Other: - keep pressure off of area Additional Orders / Instructions: Wound #2 Left Toe Great: Stop Smoking Vitamin A; Vitamin C, Zinc - Please add a multivitamin with 100% of these Increase protein intake. Activity as tolerated The following medication(s) was prescribed: lidocaine topical 4 % cream 1 1 cream topical was prescribed at facility At this time I discussed with patient that he is going to potentially have to make a decision between taking it easy and allowing this to heal or potential he ended up with an amputation in regard to his toe. Obviously he states that he is very  busy and is gonna be hard for him to take it easy. Nonetheless I don't think he wants to proceed with implication either and in fact he pretty bluntly told me that that is not the direction he would want this to go. For that reason we are gonna try a front offloading shoe. Nonetheless if he is not seen improvement with this in the Current wound care measures I do believe the total contact cast may be the way we need to go and he may need to just take it easy and not be outside all day long in hundred degree weather. He understands. Nonetheless I'm still unsure of whether or not he be able to really comply he states he has "a lot going on" we will see were things  stand in one weeks time I do want to keep them on one week follow-up visits since I think we need to keep a close eye on this. [name], [name] ([id number]) Please see above for specific wound care orders. We will see patient for re-evaluation in 1 week(s) here in the clinic. If anything worsens or changes patient will contact our office for additional recommendations. Electronic Signature(s) Signed: 08/14/2018 12:11:53 PM By: Worthy Keeler PA-C Entered By: Worthy Keeler on 08/13/2018 18:01:08 [name], [name] ([id number]) -------------------------------------------------------------------------------- ROS/PFSH Details Patient Name: [name]. Date of Service: 08/13/2018 9:00 AM Medical Record Number: [id number] Patient Account Number: [id number] Date of Birth/Sex: 26-Mar-1951 (67 y.o. M) Treating RN: Ahmed Prima Primary Care Provider: BESS, KATY Other Clinician: Referring Provider: BESS, KATY Treating Provider/Extender: STONE III,  Weeks in Treatment: 16 Information Obtained From Patient Wound History Do you currently have one or more open woundso Yes How many open wounds do you currently haveo 2 Approximately how long have you had your woundso 2 weeks How have you been treating your wound(s) until nowo yes Doctor Has your wound(s) ever healed and then re-openedo No Have you had any lab work done in the past montho No Have you tested positive for an antibiotic resistant organism (MRSA, VRE)o No Have you tested positive for osteomyelitis (bone infection)o No Have you had any tests for circulation on your legso No Have you had other problems associated with your woundso Infection, Swelling Constitutional Symptoms (General Health) Complaints and Symptoms: Negative for: Fever; Chills Eyes Medical History: Negative for: Cataracts; Glaucoma; Optic Neuritis Ear/Nose/Mouth/Throat Medical History: Negative for: Chronic sinus  problems/congestion Hematologic/Lymphatic Medical History: Positive for: Lymphedema Negative for: Anemia; Hemophilia; Human Immunodeficiency Virus; Sickle Cell Disease Respiratory Complaints and Symptoms: No Complaints or Symptoms Medical History: Negative for: Aspiration; Asthma; Chronic Obstructive Pulmonary Disease (COPD); Pneumothorax; Sleep Apnea; Tuberculosis Cardiovascular Complaints and Symptoms: No Complaints or Symptoms Medical History: ORIN, EBERWEIN ([id number]) Positive for: Arrhythmia - pacemaker; Coronary Artery Disease; Hypertension; Myocardial Infarction Negative for: Deep Vein Thrombosis; Hypotension; Peripheral Arterial Disease; Peripheral Venous Disease; Phlebitis; Vasculitis Gastrointestinal Medical History: Negative for: Cirrhosis ; Colitis; Crohnos; Hepatitis A; Hepatitis B; Hepatitis C Endocrine Medical History: Positive for: Type II Diabetes Negative for: Type I Diabetes Time with diabetes: 5 years Treated with: Oral agents Blood sugar tested every day: Yes Tested : daily AM Blood sugar testing results: Breakfast: 174 Genitourinary Medical History: Negative for: End Stage Renal Disease Immunological Medical History: Negative for: Lupus Erythematosus; Raynaudos; Scleroderma Integumentary (Skin) Medical History: Negative for: History of Burn; History of pressure wounds Musculoskeletal Medical History: Negative for: Gout; Rheumatoid Arthritis; Osteoarthritis; Osteomyelitis Neurologic Medical History: Positive for: Neuropathy Negative for: Dementia; Quadriplegia; Paraplegia; Seizure Disorder  Oncologic Medical History: Positive for: Received Chemotherapy; Received Radiation - Seeds prostate Psychiatric Complaints and Symptoms: No Complaints or Symptoms Medical History: Negative for: Anorexia/bulimia; Confinement Anxiety Lyne, HARUKI ARNOLD ([id number]) Immunizations Pneumococcal Vaccine: Received Pneumococcal Vaccination: Yes Tetanus  Vaccine: Last tetanus shot: 12/25/2017 Implantable Devices Family and Social History Cancer: Yes - Father; Diabetes: Yes - Father; Heart Disease: Yes - Mother,Father; Hypertension: Yes - Mother; Kidney Disease: Yes - Child; Lung Disease: No; Seizures: No; Stroke: No; Thyroid Problems: No; Tuberculosis: No; Current every day smoker - 1 - 1/2 pack daily; Marital Status - Married; Alcohol Use: Rarely; Drug Use: No History; Caffeine Use: Daily - coffee; Financial Concerns: No; Food, Clothing or Shelter Needs: No; Support System Lacking: No; Transportation Concerns: No Physician Affirmation I have reviewed and agree with the above information. Electronic Signature(s) Signed: 08/14/2018 12:11:53 PM By: Worthy Keeler PA-C Signed: 08/14/2018 4:43:18 PM By: Alric Quan Entered By: Worthy Keeler on 08/13/2018 09:47:29 [place], [name] ([id number]) -------------------------------------------------------------------------------- SuperBill Details Patient Name: [name]. Date of Service: 08/13/2018 Medical Record Number: [id number] Patient Account Number: [id number] Date of Birth/Sex: 07-24-1951 (67 y.o. M) Treating RN: Ahmed Prima Primary Care Provider: BESS, KATY Other Clinician: Referring Provider: BESS, KATY Treating Provider/Extender: STONE III,  Weeks in Treatment: 16 Diagnosis Coding ICD-10 Codes Code Description L97.522 Non-pressure chronic ulcer of other part of left foot with fat layer exposed L97.521 Non-pressure chronic ulcer of other part of left foot limited to breakdown of skin I87.2 Venous insufficiency (chronic) (peripheral) E11.621 Type 2 diabetes mellitus with foot ulcer I63.9 Cerebral infarction, unspecified I10 Essential (primary) hypertension Facility Procedures CPT4 Code: 72094709 Description: 62836 - DEB SUBQ TISSUE 20 SQ CM/< ICD-10 Diagnosis Description L97.522 Non-pressure chronic ulcer of other part of left foot with fat Modifier: layer  exposed Quantity: 1 Physician Procedures CPT4 Code: 6294765 Description: 11042 - WC PHYS SUBQ TISS 20 SQ CM ICD-10 Diagnosis Description L97.522 Non-pressure chronic ulcer of other part of left foot with fat Modifier: layer exposed Quantity: 1 Electronic Signature(s) Signed: 08/14/2018 12:11:53 PM By: Worthy Keeler PA-C Entered By: Worthy Keeler on 08/13/2018 09:50:20

## 2018-08-24 NOTE — Progress Notes (Signed)
Carelink Summary Report / Loop Recorder 

## 2018-08-27 ENCOUNTER — Encounter: Payer: PPO | Attending: Physician Assistant | Admitting: Physician Assistant

## 2018-08-27 DIAGNOSIS — I10 Essential (primary) hypertension: Secondary | ICD-10-CM | POA: Insufficient documentation

## 2018-08-27 DIAGNOSIS — L97522 Non-pressure chronic ulcer of other part of left foot with fat layer exposed: Secondary | ICD-10-CM | POA: Diagnosis not present

## 2018-08-27 DIAGNOSIS — L97526 Non-pressure chronic ulcer of other part of left foot with bone involvement without evidence of necrosis: Secondary | ICD-10-CM | POA: Diagnosis not present

## 2018-08-27 DIAGNOSIS — E11621 Type 2 diabetes mellitus with foot ulcer: Secondary | ICD-10-CM | POA: Insufficient documentation

## 2018-08-27 DIAGNOSIS — Z950 Presence of cardiac pacemaker: Secondary | ICD-10-CM | POA: Insufficient documentation

## 2018-08-27 DIAGNOSIS — I872 Venous insufficiency (chronic) (peripheral): Secondary | ICD-10-CM | POA: Diagnosis not present

## 2018-08-28 ENCOUNTER — Other Ambulatory Visit: Payer: Self-pay | Admitting: Urology

## 2018-08-29 ENCOUNTER — Ambulatory Visit: Payer: Self-pay | Admitting: Urology

## 2018-08-29 NOTE — Progress Notes (Signed)
MATAEO, INGWERSEN ([id number]) Visit Report for 08/27/2018 Chief Complaint Document Details Patient Name: [name], [name]. Date of Service: 08/27/2018 8:15 AM Medical Record Number: [id number] Patient Account Number: [id number] Date of Birth/Sex: 1951/03/01 (67 y.o. Male) Treating RN: [name] Primary Care Provider: BESS, [name] Other Clinician: Referring Provider: BESS, [name] Treating Provider/Extender: [name], [name] Weeks in Treatment: 18 Information Obtained from: Patient Chief Complaint Left lower leg and left great toe ulcer Electronic Signature(s) Signed: 08/28/2018 11:11:19 AM By: Marcus Keeler PA-C Entered By: [name] on 08/27/2018 08:29:02 Arakelian, [name] ([id number]) -------------------------------------------------------------------------------- Debridement Details Patient Name: [name]. Date of Service: 08/27/2018 8:15 AM Medical Record Number: [id number] Patient Account Number: [id number] Date of Birth/Sex: [birth year] (67 y.o. Male) Treating RN: [name] Primary Care Provider: BESS, [name] Other Clinician: Referring Provider: BESS, [name] Treating Provider/Extender: [name], [name] Weeks in Treatment: 18 Debridement Performed for Wound #2 Left Toe Great Assessment: Performed By: Physician [name], [name] E., PA-C Debridement Type: Debridement Severity of Tissue Pre Fat layer exposed Debridement: Pre-procedure Verification/Time Yes - 08:45 Out Taken: Start Time: 08:45 Pain Control: Lidocaine 4% Topical Solution Total Area Debrided (L x W): 0.2 (cm) x 0.2 (cm) = 0.04 (cm) Tissue and other material Viable, Non-Viable, Callus, Slough, Subcutaneous, Slough debrided: Level: Skin/Subcutaneous Tissue Debridement Description: Excisional Instrument: Curette Bleeding: Minimum Hemostasis Achieved: Pressure End Time: 08:49 Procedural Pain: 0 Post Procedural Pain: 0 Response to Treatment: Procedure was tolerated well Level of Consciousness:  Awake and Alert Post Debridement Measurements of Total Wound Length: (cm) 0.3 Width: (cm) 0.3 Depth: (cm) 0.5 Volume: (cm) 0.035 Character of Wound/Ulcer Post Debridement: Improved Severity of Tissue Post Debridement: Fat layer exposed Post Procedure Diagnosis Same as Pre-procedure Electronic Signature(s) Signed: 08/27/2018 5:51:08 PM By: [name] Signed: 08/28/2018 11:11:19 AM By: Marcus Keeler PA-C Entered By: [name] on 08/27/2018 08:49:47 [name], [name] ([id number]) -------------------------------------------------------------------------------- HPI Details Patient Name: Marcus Loll T. Date of Service: 08/27/2018 8:15 AM Medical Record Number: [id number] Patient Account Number: [id number] Date of Birth/Sex: 07-Jan-1951 (67 y.o. Male) Treating RN: [name] Primary Care Provider: BESS, [name] Other Clinician: Referring Provider: BESS, [name] Treating Provider/Extender: [name], [name] Weeks in Treatment: 18 History of Present Illness Associated Signs and Symptoms: Patient has a history of chronic venous insufficiency, diabetes mellitus type II, its reform fraction, hypertension HPI Description: 04/19/18 on evaluation today patient presents initially concerning an ulcer of his left great toe and left anterior shin. He states that the shin was definitely a trauma injury where he struck this on a metal pole injuring leg. In regard to the distal left great toe he's really not sure what happened here although he does have a significant callous noted as well. He does have a pacemaker. Fortunately his ABI was 0.98 on the right and 0.83 on the left this appears to be doing fairly well. Overall I'm pleased with that. Nonetheless he states that this really has not seem to be improving. He has been tolerating the dressing changes without complication. The left anterior leg ulcer began on 04/05/18 according to what he tells [place] today although one note I did have for review said  it was April 5. I'm inclined to believe it was the fifth she did have an office visit on 04/01/18 with his family nurse practitioner [name]. Nonetheless either way it's been going on this month for several weeks. The left great toe has actually been present since January 1 he does not know how this  occurred. Fortunately has no pain at the site although he does have pain on the left anterior shin. His hemoglobin A1c is 8.0 in March 2019. 04/26/18 on evaluation today patient presents for follow-up concerning his life into your [place] also as well as his left great toe also. Fortunately the shin actually appears to be doing better on evaluation today in my opinion I'm not seeing as much in the way of fluffernutter on the surface although it does still require debridement today this is improved. Size wise it's really not much different. With that being said the total ulcer actually appears to be a little bit more macerated today I'm not sure why they state that several days ago when this was changed that was not the case. Fortunately is not having severe pain although both areas do hurt the shin is worse. 05/03/18 on evaluation today patient appears to be doing better in regard to his left first toe and left shin ulcers. He is been tolerating the dressing changes without complication and the good news is this seems to be showing signs of getting better. Overall I'm very pleased with the progress that has been made over the past week. Patient is still having pain especially in regard to the left shin. 05/10/18 on evaluation today patient appears to be doing rather well in regard to his two ulcers. The toe ulcer appears to be smaller he does have some undermining however in this had to be cleaned out just a little bit. With that being said overall I feel like this is showing signs of improvement. The left anterior shin ulcer also showing signs of improvement at this time. There does not appear to  be any evidence of infection which is good news. 05/21/18 on evaluation today patient appears to be doing very well in regard to his left shin ulcer. He has been tolerating the dressing changes without complication. With that being said he is having issues at this point with his great toe on the left. We have not x-rayed that at this point I think we may need to. Nonetheless he has pain really at the roughly 5 o'clock location but nowhere else and I really cannot explain this I do not see any obvious form body. Nonetheless he continues to have issues with getting this area to close it's not progressing as nicely. 05/28/18 on evaluation today patient appears to be doing very well in regard to his left lower extremity anterior ulcer. He has been tolerating the dressing changes without complication. Overall this is making excellent progress with the collagen dressing. His left great toe x-ray did return and showed that he had no if you that normality. He did have a plantar heel spur but this is more degenerative. Otherwise just arthritis was noted no osteomyelitis and no obvious form body was visualized. Overall things seem to be going fairly well. With that being said he still has pain when looking at the toe from the plantar aspect at roughly the six back to the 4 o'clock location which is not quite as bad as last time but still hurts more than any other part of his wound. It does appear the Iodoflex has been of benefit however. 06/04/18 on evaluation today patient appears to be doing excellent in regard to his left anterior shin ulcer. This has made excellent progress and is showing signs of improving in fact very close to completely closing in healing. Nonetheless unfortunately his left great toe has not faired quite as  well as far as progress is concerned. He notes that after last week's Leyh, KHALEEF RUBY. ([id number]) debridement he actually seem to be doing a little bit better for a couple of days  until Thursday of last week when he had increases in discomfort as well is changes in the draining. He states that the drainage has been more green in color according to his wife and what he has seen. He's also had more discomfort. He relates to me again that he really feels like this all started when he stepped on a nail although he does not exactly remember it it seemed to be a puncture wound that he feels may be the culprit for why all this started. Nonetheless I am getting more concerned about the possibility of osteomyelitis despite the fact that the x-ray was negative I was hopeful in this interim since last week to this week that we would see some improvement in the overall appearance of the toe. Unfortunately if anything I feel like it's a little worse. It was macerated but I think we can attribute this to the fact that the patient did have an episode last night we got stuck in the rain and he thinks the dressing got wet. He did not change it I told him in the future if this were to happen to please go ahead and change the dressing it won't hurt to change it more frequently in that situation. Otherwise other than the maceration I do not see any evidence of anything being any worse but it also does not appear to be any better. 06/11/18 on evaluation today patient's wounds both the great toe as well as the left shin both on the left side show signs of good improvement. Fortunately the patient's culture came back negative in regard to the toe and there does not appear to be any evidence of infection. His MRI is actually scheduled for 06/19/18. Fortunately this is right before I see him next week and then we can go to the results of the culture at that point. Patient is in agreement with that plan. With that being said otherwise he has been showing signs of having less pain in regard to the toe which is also good news my hope is that he does not have osteomyelitis. 06/18/18 on evaluation today  patient appears to be doing very well in regard to his left lower extremity ulcer. He also states at this point in time that he has been tolerating the dressing changes with the toe it appears also to be doing very well at this point. In general I'm happy with the progress that he seems to be making. With that being said he still continues to have pain in regard to the great toe ulcer fortunately this does not seem to be as significant as it was in the beginning but still nonetheless he is having some pain. No fevers, chills, nausea, or vomiting noted at this time. 06/25/18 on evaluation today patient actually appears to be doing very well in regard to his left anterior shin ulcer. He's been tolerating the dressing changes without complication in this seems to have done extremely well. In fact this appears to be healed. With that being said his toe ulcer still continues to get in trouble fortunately not as much as has been in the past and there have been some signs of improvement and better granulation although I still think he's getting a lot of callous to the area which seems to indicate a  lot of pressure and friction. I believe he may benefit from a total contact cast especially now that the left shin ulcer has healed. 07/02/18 on evaluation today patient appears to be doing rather well in regard to his left anterior shin ulcer which is actually completely healed at this point. With that being said he has left great toe ulcer actually appears to be doing better he states over the weekend he actually had a significant amount of drainage from the toe after it had been hurting for some time. He states when his wife change the dressing there was a lot of discharge and fluid he actually took a picture he showed me today. Since that time he has not noted as much tenderness he feels like something may have "popped out" 07/09/18 on evaluation today patient appears to be doing about the same in regard to his  toe ulcer. He really has not shown any signs of significant improvement this seems to just be maintaining at best. He continues to be extremely active in fact I think this may be the biggest issue that he continues to do things as normal in fact right now he's remodeling the bathroom. Obviously I think he is a very active individual and as previously discussed I think a total contact cast would be of great benefit for him. This was discussed with patient yet again today I think we are gonna proceed in this regard. 07/12/18 patient presents today for his first total contact cast change. He has had this on for three days and states that though he felt like it may be rubbing a little bit around his ankles there does not appear to be any injury at this point. Overall the wound actually looks better in my pinion. 07/16/18 on evaluation today patient's wound actually appears to be doing much better at this point. He has been tolerating the dressing changes without complication. With that being said I do believe the total contact cast has been of benefit he states he is having much less discomfort that he previously had. Overall I'm very pleased with how things have progressed in just one weeks worth of the total contact cast. 07/23/18 on evaluation today patient's toe ulcer on the left great toe actually appears to be showing signs of improvement which is good news. In general I'm very pleased with the progress that he has made in the cast over the past two weeks. Fortunately there does not appear to be any evidence of infection at this time. He also is not having as much maceration as was previously noted. The patient is happy in this regard that he notes that the boot and cast is not the most comfortable thing in the world. 07/30/18-He is here in follow-up evaluation for left great toe ulcer. He presents in a total contact cast, after removal he is noted to be macerated with new injury to the dorsum of his  left foot; he continues to work outside and this is most likely secondary to [name], Marcus T. ([id number]) excessive sweating versus wet from a shower. We will hold off on reapplying total contact cast, allowing the dorsal foot injury to resolve and allowing for complete drying as he will continue to work outside. He will have a nurse visit next Monday to evaluate dorsal foot partial thickness ulcer and moisture/maceration to foot and follow-up in 2 weeks 08/13/18 on evaluation today patient's toe ulcer actually appears to be doing about the same. Fortunately the dorsal foot ulcer has  healed and his foot is not appearing to be as macerated. He continues to be overly active in my pinion in regard to the remodeling of his home he states he was actually finishing up some plumbing yesterday. Nonetheless I think that right now being outside the majority the day in a total contact cast in roughly 90 to 100o weather has been counterproductive not helpful for them in regard to healing the wound. I explained to him that if we're gonna get this to heal he may need to drop back a bit and take it easy to allow this to heal. He states that's not something is gonna likely be able to do. 08/20/18 on evaluation today patient actually appears to be doing a little better in regard to his toe ulcer. He's been tolerating the dressing changes much better he did not tolerate the front offloading shoe however. He states it actually caused his left knee to hurt that something that is been told before he probably needs replacement nonetheless he wasn't able to where the shoe past Friday so really only for about three days or so. Nonetheless we have not tried a PEG assist offloading shoe. I think this may be something that we could try for him. In general I do feel like the wound size is slightly smaller in particular in regard to the area of undermining. 08/27/18 on evaluation today patient actually appears to be doing possibly  a bit better in regard to his left great toe ulcer. He's been tolerating the dressing changes without complication. With that being said the patient actually does show evidence of improvement although there is still an opening of the ulcer down to the bone. He is previously tested negative for osteomyelitis by way of MRI. There does not appear to be necrotic bone noted in the base of the wound. Electronic Signature(s) Signed: 08/28/2018 11:11:19 AM By: Marcus Keeler PA-C Entered By: [name] on 08/27/2018 08:59:16 [name], [name] ([id number]) -------------------------------------------------------------------------------- Physical Exam Details Patient Name: Marcus Loll T. Date of Service: 08/27/2018 8:15 AM Medical Record Number: [id number] Patient Account Number: [id number] Date of Birth/Sex: Sep 01, 1951 (67 y.o. Male) Treating RN: [name] Primary Care Provider: BESS, [name] Other Clinician: Referring Provider: BESS, [name] Treating Provider/Extender: [name], [name] Weeks in Treatment: 94 Constitutional Well-nourished and well-hydrated in no acute distress. Respiratory normal breathing without difficulty. Psychiatric this patient is able to make decisions and demonstrates good insight into disease process. Alert and Oriented x 3. pleasant and cooperative. Notes Patient's wound bed currently did require sharp debridement I removed a small amount of slough and necrotic tissue from the surface of the wound. Also removed a significant amount of callous from around the edge of the wound. Overall post debridement the wound bed appears to be doing better. This is obviously good news. Electronic Signature(s) Signed: 08/28/2018 11:11:19 AM By: Marcus Keeler PA-C Entered By: [name] on 08/27/2018 09:00:01 Barnhart, [name] ([id number]) -------------------------------------------------------------------------------- Physician Orders Details Patient Name: [name]. Date of Service: 08/27/2018 8:15 AM Medical Record Number: [id number] Patient Account Number: [id number] Date of Birth/Sex: Aug 26, 1951 (67 y.o. Male) Treating RN: [name] Primary Care Provider: BESS, [name] Other Clinician: Referring Provider: BESS, [name] Treating Provider/Extender: Melburn Hake, [name] Weeks in Treatment: 18 Verbal / Phone Orders: No Diagnosis Coding ICD-10 Coding Code Description L97.522 Non-pressure chronic ulcer of other part of left foot with fat layer exposed L97.521 Non-pressure chronic ulcer of other part of left foot limited to breakdown  of skin I87.2 Venous insufficiency (chronic) (peripheral) E11.621 Type 2 diabetes mellitus with foot ulcer I63.9 Cerebral infarction, unspecified I10 Essential (primary) hypertension Wound Cleansing Wound #2 Left Toe Great o Clean wound with Normal Saline. o Cleanse wound with mild soap and water Anesthetic (add to Medication List) Wound #2 Left Toe Great o Topical Lidocaine 4% cream applied to wound bed prior to debridement (In Clinic Only). Primary Wound Dressing Wound #2 Left Toe Great o Silver Alginate Secondary Dressing Wound #2 Left Toe Great o Gauze and Kerlix/Conform - secure with coban o Foam Dressing Change Frequency Wound #2 Left Toe Great o Change dressing every other day. Follow-up Appointments Wound #2 Left Toe Great o Return Appointment in 1 week. Off-Loading Wound #2 Left Toe Great o Open toe surgical shoe with peg assist. o Other: - keep pressure off of area Hemann, [name] ([id number]) Additional Orders / Instructions Wound #2 Left Toe Great o Stop Smoking o Vitamin A; Vitamin C, Zinc - Please add a multivitamin with 100% of these o Increase protein intake. o Activity as tolerated Electronic Signature(s) Signed: 08/27/2018 5:51:08 PM By: [name] Signed: 08/28/2018 11:11:19 AM By: Marcus Keeler PA-C Entered By: [name] on 08/27/2018 08:51:24 Nowling,  [name] ([id number]) -------------------------------------------------------------------------------- Problem List Details Patient Name: Marcus Loll T. Date of Service: 08/27/2018 8:15 AM Medical Record Number: [id number] Patient Account Number: [id number] Date of Birth/Sex: [birth year] (67 y.o. Male) Treating RN: [name] Primary Care Provider: BESS, [name] Other Clinician: Referring Provider: BESS, [name] Treating Provider/Extender: Melburn Hake, [name] Weeks in Treatment: 18 Active Problems ICD-10 Evaluated Encounter Code Description Active Date Today Diagnosis L97.522 Non-pressure chronic ulcer of other part of left foot with fat 04/19/2018 No Yes layer exposed L97.521 Non-pressure chronic ulcer of other part of left foot limited to 07/30/2018 No Yes breakdown of skin I87.2 Venous insufficiency (chronic) (peripheral) 04/19/2018 No Yes E11.621 Type 2 diabetes mellitus with foot ulcer 04/19/2018 No Yes I63.9 Cerebral infarction, unspecified 04/19/2018 No Yes I10 Essential (primary) hypertension 04/19/2018 No Yes Inactive Problems Resolved Problems ICD-10 Code Description Active Date Resolved Date S81.812A Laceration without foreign body, left lower leg, initial encounter 04/19/2018 04/19/2018 L54.492 Non-pressure chronic ulcer of other part of left lower leg with fat 04/19/2018 04/19/2018 layer exposed Electronic Signature(s) JERI, RAWLINS ([id number]) Signed: 08/28/2018 11:11:19 AM By: Marcus Keeler PA-C Entered By: [name] on 08/27/2018 08:28:54 [name] ([id number]) -------------------------------------------------------------------------------- Progress Note Details Patient Name: Marcus Loll T. Date of Service: 08/27/2018 8:15 AM Medical Record Number: [id number] Patient Account Number: [id number] Date of Birth/Sex: [birth year] (67 y.o. Male) Treating RN: [name] Primary Care Provider: BESS, [name] Other Clinician: Referring Provider: BESS, [name] Treating  Provider/Extender: Melburn Hake, [name] Weeks in Treatment: 18 Subjective Chief Complaint Information obtained from Patient Left lower leg and left great toe ulcer History of Present Illness (HPI) The following HPI elements were documented for the patient's wound: Associated Signs and Symptoms: Patient has a history of chronic venous insufficiency, diabetes mellitus type II, its reform fraction, hypertension 04/19/18 on evaluation today patient presents initially concerning an ulcer of his left great toe and left anterior shin. He states that the shin was definitely a trauma injury where he struck this on a metal pole injuring leg. In regard to the distal left great toe he's really not sure what happened here although he does have a significant callous noted as well. He does have a pacemaker. Fortunately his ABI was 0.98 on the right and 0.83 on  the left this appears to be doing fairly well. Overall I'm pleased with that. Nonetheless he states that this really has not seem to be improving. He has been tolerating the dressing changes without complication. The left anterior leg ulcer began on 04/05/18 according to what he tells [place] today although one note I did have for review said it was April 5. I'm inclined to believe it was the fifth she did have an office visit on 04/01/18 with his family nurse practitioner [name]. Nonetheless either way it's been going on this month for several weeks. The left great toe has actually been present since January 1 he does not know how this occurred. Fortunately has no pain at the site although he does have pain on the left anterior shin. His hemoglobin A1c is 8.0 in March 2019. 04/26/18 on evaluation today patient presents for follow-up concerning his life into your [place] also as well as his left great toe also. Fortunately the shin actually appears to be doing better on evaluation today in my opinion I'm not seeing as much in the way of fluffernutter on  the surface although it does still require debridement today this is improved. Size wise it's really not much different. With that being said the total ulcer actually appears to be a little bit more macerated today I'm not sure why they state that several days ago when this was changed that was not the case. Fortunately is not having severe pain although both areas do hurt the shin is worse. 05/03/18 on evaluation today patient appears to be doing better in regard to his left first toe and left shin ulcers. He is been tolerating the dressing changes without complication and the good news is this seems to be showing signs of getting better. Overall I'm very pleased with the progress that has been made over the past week. Patient is still having pain especially in regard to the left shin. 05/10/18 on evaluation today patient appears to be doing rather well in regard to his two ulcers. The toe ulcer appears to be smaller he does have some undermining however in this had to be cleaned out just a little bit. With that being said overall I feel like this is showing signs of improvement. The left anterior shin ulcer also showing signs of improvement at this time. There does not appear to be any evidence of infection which is good news. 05/21/18 on evaluation today patient appears to be doing very well in regard to his left shin ulcer. He has been tolerating the dressing changes without complication. With that being said he is having issues at this point with his great toe on the left. We have not x-rayed that at this point I think we may need to. Nonetheless he has pain really at the roughly 5 o'clock location but nowhere else and I really cannot explain this I do not see any obvious form body. Nonetheless he continues to have issues with getting this area to close it's not progressing as nicely. 05/28/18 on evaluation today patient appears to be doing very well in regard to his left lower extremity anterior  ulcer. He has been tolerating the dressing changes without complication. Overall this is making excellent progress with the collagen dressing. His left great toe x-ray did return and showed that he had no if you that normality. He did have a plantar heel spur [name], Marcus T. ([id number]) but this is more degenerative. Otherwise just arthritis was noted no osteomyelitis and  no obvious form body was visualized. Overall things seem to be going fairly well. With that being said he still has pain when looking at the toe from the plantar aspect at roughly the six back to the 4 o'clock location which is not quite as bad as last time but still hurts more than any other part of his wound. It does appear the Iodoflex has been of benefit however. 06/04/18 on evaluation today patient appears to be doing excellent in regard to his left anterior shin ulcer. This has made excellent progress and is showing signs of improving in fact very close to completely closing in healing. Nonetheless unfortunately his left great toe has not faired quite as well as far as progress is concerned. He notes that after last week's debridement he actually seem to be doing a little bit better for a couple of days until Thursday of last week when he had increases in discomfort as well is changes in the draining. He states that the drainage has been more green in color according to his wife and what he has seen. He's also had more discomfort. He relates to me again that he really feels like this all started when he stepped on a nail although he does not exactly remember it it seemed to be a puncture wound that he feels may be the culprit for why all this started. Nonetheless I am getting more concerned about the possibility of osteomyelitis despite the fact that the x-ray was negative I was hopeful in this interim since last week to this week that we would see some improvement in the overall appearance of the toe. Unfortunately if  anything I feel like it's a little worse. It was macerated but I think we can attribute this to the fact that the patient did have an episode last night we got stuck in the rain and he thinks the dressing got wet. He did not change it I told him in the future if this were to happen to please go ahead and change the dressing it won't hurt to change it more frequently in that situation. Otherwise other than the maceration I do not see any evidence of anything being any worse but it also does not appear to be any better. 06/11/18 on evaluation today patient's wounds both the great toe as well as the left shin both on the left side show signs of good improvement. Fortunately the patient's culture came back negative in regard to the toe and there does not appear to be any evidence of infection. His MRI is actually scheduled for 06/19/18. Fortunately this is right before I see him next week and then we can go to the results of the culture at that point. Patient is in agreement with that plan. With that being said otherwise he has been showing signs of having less pain in regard to the toe which is also good news my hope is that he does not have osteomyelitis. 06/18/18 on evaluation today patient appears to be doing very well in regard to his left lower extremity ulcer. He also states at this point in time that he has been tolerating the dressing changes with the toe it appears also to be doing very well at this point. In general I'm happy with the progress that he seems to be making. With that being said he still continues to have pain in regard to the great toe ulcer fortunately this does not seem to be as significant as it was in  the beginning but still nonetheless he is having some pain. No fevers, chills, nausea, or vomiting noted at this time. 06/25/18 on evaluation today patient actually appears to be doing very well in regard to his left anterior shin ulcer. He's been tolerating the dressing changes  without complication in this seems to have done extremely well. In fact this appears to be healed. With that being said his toe ulcer still continues to get in trouble fortunately not as much as has been in the past and there have been some signs of improvement and better granulation although I still think he's getting a lot of callous to the area which seems to indicate a lot of pressure and friction. I believe he may benefit from a total contact cast especially now that the left shin ulcer has healed. 07/02/18 on evaluation today patient appears to be doing rather well in regard to his left anterior shin ulcer which is actually completely healed at this point. With that being said he has left great toe ulcer actually appears to be doing better he states over the weekend he actually had a significant amount of drainage from the toe after it had been hurting for some time. He states when his wife change the dressing there was a lot of discharge and fluid he actually took a picture he showed me today. Since that time he has not noted as much tenderness he feels like something may have "popped out" 07/09/18 on evaluation today patient appears to be doing about the same in regard to his toe ulcer. He really has not shown any signs of significant improvement this seems to just be maintaining at best. He continues to be extremely active in fact I think this may be the biggest issue that he continues to do things as normal in fact right now he's remodeling the bathroom. Obviously I think he is a very active individual and as previously discussed I think a total contact cast would be of great benefit for him. This was discussed with patient yet again today I think we are gonna proceed in this regard. 07/12/18 patient presents today for his first total contact cast change. He has had this on for three days and states that though he felt like it may be rubbing a little bit around his ankles there does not appear  to be any injury at this point. Overall the wound actually looks better in my pinion. 07/16/18 on evaluation today patient's wound actually appears to be doing much better at this point. He has been tolerating the dressing changes without complication. With that being said I do believe the total contact cast has been of benefit he states he is having much less discomfort that he previously had. Overall I'm very pleased with how things have progressed in just one weeks worth of the total contact cast. Edgin, [name] ([id number]) 07/23/18 on evaluation today patient's toe ulcer on the left great toe actually appears to be showing signs of improvement which is good news. In general I'm very pleased with the progress that he has made in the cast over the past two weeks. Fortunately there does not appear to be any evidence of infection at this time. He also is not having as much maceration as was previously noted. The patient is happy in this regard that he notes that the boot and cast is not the most comfortable thing in the world. 07/30/18-He is here in follow-up evaluation for left great toe ulcer. He presents  in a total contact cast, after removal he is noted to be macerated with new injury to the dorsum of his left foot; he continues to work outside and this is most likely secondary to excessive sweating versus wet from a shower. We will hold off on reapplying total contact cast, allowing the dorsal foot injury to resolve and allowing for complete drying as he will continue to work outside. He will have a nurse visit next Monday to evaluate dorsal foot partial thickness ulcer and moisture/maceration to foot and follow-up in 2 weeks 08/13/18 on evaluation today patient's toe ulcer actually appears to be doing about the same. Fortunately the dorsal foot ulcer has healed and his foot is not appearing to be as macerated. He continues to be overly active in my pinion in regard to the remodeling of his  home he states he was actually finishing up some plumbing yesterday. Nonetheless I think that right now being outside the majority the day in a total contact cast in roughly 90 to 100 weather has been counterproductive not helpful for them in regard to healing the wound. I explained to him that if we're gonna get this to heal he may need to drop back a bit and take it easy to allow this to heal. He states that's not something is gonna likely be able to do. 08/20/18 on evaluation today patient actually appears to be doing a little better in regard to his toe ulcer. He's been tolerating the dressing changes much better he did not tolerate the front offloading shoe however. He states it actually caused his left knee to hurt that something that is been told before he probably needs replacement nonetheless he wasn't able to where the shoe past Friday so really only for about three days or so. Nonetheless we have not tried a PEG assist offloading shoe. I think this may be something that we could try for him. In general I do feel like the wound size is slightly smaller in particular in regard to the area of undermining. 08/27/18 on evaluation today patient actually appears to be doing possibly a bit better in regard to his left great toe ulcer. He's been tolerating the dressing changes without complication. With that being said the patient actually does show evidence of improvement although there is still an opening of the ulcer down to the bone. He is previously tested negative for osteomyelitis by way of MRI. There does not appear to be necrotic bone noted in the base of the wound. Patient History Information obtained from Patient. Family History Cancer - Father, Diabetes - Father, Heart Disease - Mother,Father, Hypertension - Mother, Kidney Disease - Child, No family history of Lung Disease, Seizures, Stroke, Thyroid Problems, Tuberculosis. Social History Current every day smoker - 1 - 1/2 pack daily,  Marital Status - Married, Alcohol Use - Rarely, Drug Use - No History, Caffeine Use - Daily - coffee. Review of Systems (ROS) Constitutional Symptoms (General Health) Denies complaints or symptoms of Fever, Chills. Respiratory The patient has no complaints or symptoms. Cardiovascular The patient has no complaints or symptoms. Psychiatric The patient has no complaints or symptoms. AMINE, ADELSON ([id number]) Objective Constitutional Well-nourished and well-hydrated in no acute distress. Vitals Time Taken: 8:15 AM, Height: 69 in, Weight: 238 lbs, BMI: 35.1, Temperature: 97.6 F, Pulse: 90 bpm, Respiratory Rate: 16 breaths/min, Blood Pressure: 127/63 mmHg. Respiratory normal breathing without difficulty. Psychiatric this patient is able to make decisions and demonstrates good insight into disease process. Alert and  Oriented x 3. pleasant and cooperative. General Notes: Patient's wound bed currently did require sharp debridement I removed a small amount of slough and necrotic tissue from the surface of the wound. Also removed a significant amount of callous from around the edge of the wound. Overall post debridement the wound bed appears to be doing better. This is obviously good news. Integumentary (Hair, Skin) Wound #2 status is Open. Original cause of wound was Trauma. The wound is located on the Left Toe Great. The wound measures 0.2cm length x 0.2cm width x 0.2cm depth; 0.031cm^2 area and 0.006cm^3 volume. There is Fat Layer (Subcutaneous Tissue) Exposed exposed. There is no tunneling noted, however, there is undermining starting at 12:00 and ending at 12:00 with a maximum distance of 0.2cm. There is a none present amount of drainage noted. The wound margin is flat and intact. There is no granulation within the wound bed. There is a medium (34-66%) amount of necrotic tissue within the wound bed including Adherent Slough. The periwound skin appearance exhibited: Callus. The  periwound skin appearance did not exhibit: Crepitus, Excoriation, Induration, Rash, Scarring, Dry/Scaly, Maceration, Atrophie Blanche, Cyanosis, Ecchymosis, Hemosiderin Staining, Mottled, Pallor, Rubor, Erythema. Periwound temperature was noted as No Abnormality. The periwound has tenderness on palpation. 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 There was a Excisional Skin/Subcutaneous Tissue Keesey, Mahmood T. ([id number]) Debridement with a total area of 0.04 sq cm performed by [name], [name] E., PA-C. With the following instrument(s): Curette to remove Viable and Non-Viable tissue/material. Material removed includes Callus, Subcutaneous Tissue, and Slough after achieving pain control using Lidocaine 4% Topical Solution. No specimens were taken. A time out was conducted at 08:45, prior to the start of the procedure. A Minimum amount of bleeding was controlled with Pressure. The procedure was tolerated well with a pain level of 0 throughout and a pain level of 0 following the procedure. Patient s Level of Consciousness post procedure was recorded as Awake and Alert. Post Debridement Measurements: 0.3cm length x 0.3cm width x 0.5cm depth; 0.035cm^3 volume. Character of Wound/Ulcer Post Debridement is improved. Severity of Tissue Post Debridement is: Fat layer exposed. Post procedure Diagnosis Wound #2: Same as Pre-Procedure Plan Wound Cleansing: Wound #2 Left Toe Great: Clean wound with Normal  Saline. Cleanse wound with mild soap and water Anesthetic (add to Medication List): Wound #2 Left Toe Great: Topical Lidocaine 4% cream applied to wound bed prior to debridement (In Clinic Only). Primary Wound Dressing: Wound #2 Left Toe Great: Silver Alginate Secondary Dressing: Wound #2 Left Toe Great: Gauze and Kerlix/Conform - secure with coban Foam Dressing Change Frequency: Wound #2 Left Toe Great: Change dressing every other day. Follow-up Appointments: Wound #2 Left Toe Great: Return Appointment in 1 week. Off-Loading: Wound #2 Left Toe Great: Open toe surgical shoe with peg assist. Other: - keep pressure off of area Additional Orders / Instructions: Wound #2 Left Toe Great: Stop Smoking Vitamin A; Vitamin C, Zinc - Please add a multivitamin with 100% of these Increase protein intake. Activity as tolerated I explained to the patient that I do  feel like he is actually shown signs of some good granulation noted in the base of the wound. With that being said I do believe that we need to continue with the Current wound care measures since that seems to been the best for him up to this point. The patients in agreement with that plan. We will subsequently see were things stand at follow-up in one weeks time. Please see above for specific wound care orders. We will see patient for re-evaluation in 1 week(s) here in the clinic. If anything worsens or changes patient will contact our office for additional recommendations. DATON, SZILAGYI ([id number]) Electronic Signature(s) Signed: 08/28/2018 11:11:19 AM By: Marcus Keeler PA-C Entered By: [name] on 08/27/2018 09:01:00 Kofman, [name] ([id number]) -------------------------------------------------------------------------------- ROS/PFSH Details Patient Name: [name]. Date of Service: 08/27/2018 8:15 AM Medical Record Number: [id number] Patient Account Number: [id number] Date of Birth/Sex: 15-[name]-1952 (67 y.o.  Male) Treating RN: [name] Primary Care Provider: BESS, [name] Other Clinician: Referring Provider: BESS, [name] Treating Provider/Extender: [name], [name] Weeks in Treatment: 18 Information Obtained From Patient Wound History Do you currently have one or more open woundso Yes How many open wounds do you currently haveo 2 Approximately how long have you had your woundso 2 weeks How have you been treating your wound(s) until nowo yes Doctor Has your wound(s) ever healed and then re-openedo No Have you had any lab work done in the past montho No Have you tested positive for an antibiotic resistant organism (MRSA, VRE)o No Have you tested positive for osteomyelitis (bone infection)o No Have you had any tests for circulation on your legso No Have you had other problems associated with your woundso Infection, Swelling Constitutional Symptoms (General Health) Complaints and Symptoms: Negative for: Fever; Chills Eyes Medical History: Negative for: Cataracts; Glaucoma; Optic Neuritis Ear/Nose/Mouth/Throat Medical History: Negative for: Chronic sinus problems/congestion Hematologic/Lymphatic Medical History: Positive for: Lymphedema Negative for: Anemia; Hemophilia; Human Immunodeficiency Virus; Sickle Cell Disease Respiratory Complaints and Symptoms: No Complaints or Symptoms Medical History: Negative for: Aspiration; Asthma; Chronic Obstructive Pulmonary Disease (COPD); Pneumothorax; Sleep Apnea; Tuberculosis Cardiovascular Complaints and Symptoms: No Complaints or Symptoms Medical History: ELIAS, DENNINGTON ([id number]) Positive for: Arrhythmia - pacemaker; Coronary Artery Disease; Hypertension; Myocardial Infarction Negative for: Deep Vein Thrombosis; Hypotension; Peripheral Arterial Disease; Peripheral Venous Disease; Phlebitis; Vasculitis Gastrointestinal Medical History: Negative for: Cirrhosis ; Colitis; Crohnos; Hepatitis A; Hepatitis B; Hepatitis  C Endocrine Medical History: Positive for: Type II Diabetes Negative for: Type I Diabetes Time with diabetes: 5 years Treated with: Oral agents Blood sugar tested every day: Yes Tested : daily AM Blood sugar testing results: Breakfast: 174 Genitourinary Medical History: Negative for: End Stage Renal Disease Immunological Medical History: Negative for: Lupus Erythematosus; Raynaudos; Scleroderma Integumentary (Skin) Medical History: Negative for: History of Burn; History of pressure wounds Musculoskeletal Medical History: Negative for: Gout; Rheumatoid Arthritis; Osteoarthritis; Osteomyelitis Neurologic Medical History: Positive for: Neuropathy Negative for: Dementia; Quadriplegia; Paraplegia; Seizure Disorder Oncologic Medical History: Positive for: Received Chemotherapy; Received Radiation - Seeds prostate Psychiatric Complaints and Symptoms: No Complaints or Symptoms Medical History: Negative for: Anorexia/bulimia; Confinement Anxiety Coller, RJ PEDROSA ([id number]) Immunizations Pneumococcal Vaccine: Received Pneumococcal Vaccination: Yes Tetanus Vaccine: Last tetanus shot: 12/25/2017 Implantable Devices Family and Social History Cancer: Yes - Father; Diabetes: Yes - Father; Heart Disease: Yes - Mother,Father; Hypertension: Yes - Mother; Kidney Disease: Yes - Child; Lung Disease: No; Seizures: No; Stroke: No; Thyroid Problems: No; Tuberculosis: No; Current every day smoker - 1 - 1/2  pack daily; Marital Status - Married; Alcohol Use: Rarely; Drug Use: No History; Caffeine Use: Daily - coffee; Financial Concerns: No; Food, Clothing or Shelter Needs: No; Support System Lacking: No; Transportation Concerns: No Physician Affirmation I have reviewed and agree with the above information. Electronic Signature(s) Signed: 08/27/2018 5:51:08 PM By: [name] Signed: 08/28/2018 11:11:19 AM By: Marcus Keeler PA-C Entered By: [name] on 08/27/2018 08:59:37 Halley,  [name] ([id number]) -------------------------------------------------------------------------------- SuperBill Details Patient Name: [name]. Date of Service: 08/27/2018 Medical Record Number: [id number] Patient Account Number: [id number] Date of Birth/Sex: 28-Jul-1951 (67 y.o. Male) Treating RN: [name] Primary Care Provider: BESS, [name] Other Clinician: Referring Provider: BESS, [name] Treating Provider/Extender: Melburn Hake, [name] Weeks in Treatment: 18 Diagnosis Coding ICD-10 Codes Code Description L97.522 Non-pressure chronic ulcer of other part of left foot with fat layer exposed L97.521 Non-pressure chronic ulcer of other part of left foot limited to breakdown of skin I87.2 Venous insufficiency (chronic) (peripheral) E11.621 Type 2 diabetes mellitus with foot ulcer I63.9 Cerebral infarction, unspecified I10 Essential (primary) hypertension Facility Procedures CPT4 Code: [id number] Description: 41962 - DEB SUBQ TISSUE 20 SQ CM/< ICD-10 Diagnosis Description L97.522 Non-pressure chronic ulcer of other part of left foot with fat Modifier: layer exposed Quantity: 1 Physician Procedures CPT4 Code: 2297989 Description: 11042 - WC PHYS SUBQ TISS 20 SQ CM ICD-10 Diagnosis Description L97.522 Non-pressure chronic ulcer of other part of left foot with fat Modifier: layer exposed Quantity: 1 Electronic Signature(s) Signed: 08/28/2018 11:11:19 AM By: Marcus Keeler PA-C Entered By: [name] on 08/27/2018 09:01:22

## 2018-08-29 NOTE — Progress Notes (Signed)
DAVINCI, GLOTFELTY ([id number]) Visit Report for 08/27/2018 Arrival Information Details Patient Name: [name], [name]. Date of Service: 08/27/2018 8:15 AM Medical Record Number: [id number] Patient Account Number: [id number] Date of Birth/Sex: 06/25/1951 (67 y.o. Male) Treating RN: Montey Hora Primary Care Galen Malkowski: BESS, KATY Other Clinician: Referring Shaun Runyon: BESS, KATY Treating Charlene Cowdrey/Extender: Melburn Hake, HOYT Weeks in Treatment: 18 Visit Information History Since Last Visit Added or deleted any medications: No Patient Arrived: Ambulatory Any new allergies or adverse reactions: No Arrival Time: 08:15 Had a fall or experienced change in No Accompanied By: self activities of daily living that may affect Transfer Assistance: None risk of falls: Patient Identification Verified: Yes Signs or symptoms of abuse/neglect since last visito No Secondary Verification Process Completed: Yes Hospitalized since last visit: No Patient Requires Transmission-Based No Implantable device outside of the clinic excluding No Precautions: cellular tissue based products placed in the center Patient Has Alerts: Yes since last visit: Patient Alerts: Type II Has Dressing in Place as Prescribed: Yes Diabetic Has Compression in Place as Prescribed: Yes Pain Present Now: No Electronic Signature(s) Signed: 08/27/2018 4:40:26 PM By: Roger Shelter Entered By: Roger Shelter on 08/27/2018 08:28:58 [name] ([id number]) -------------------------------------------------------------------------------- Encounter Discharge Information Details Patient Name: Marcus Loll T. Date of Service: 08/27/2018 8:15 AM Medical Record Number: [id number] Patient Account Number: [id number] Date of Birth/Sex: [birth year] (67 y.o. Male) Treating RN: Cornell Barman Primary Care Zein Helbing: BESS, KATY Other Clinician: Referring Hayven Fatima: BESS, KATY Treating Echo Propp/Extender: Melburn Hake, HOYT Weeks in Treatment:  18 Encounter Discharge Information Items Discharge Condition: Stable Ambulatory Status: Ambulatory Discharge Destination: Home Transportation: Private Auto Accompanied By: self Schedule Follow-up Appointment: Yes Clinical Summary of Care: Electronic Signature(s) Signed: 08/27/2018 6:56:50 PM By: Gretta Cool, BSN, RN, CWS, Kim RN, BSN Entered By: Gretta Cool, BSN, RN, CWS, Kim on 08/27/2018 09:01:30 Jezek, Huntley Dec ([id number]) -------------------------------------------------------------------------------- Lower Extremity Assessment Details Patient Name: Marcus Loll T. Date of Service: 08/27/2018 8:15 AM Medical Record Number: [id number] Patient Account Number: [id number] Date of Birth/Sex: 30-Apr-1951 (67 y.o. Male) Treating RN: Roger Shelter Primary Care Keilynn Marano: BESS, KATY Other Clinician: Referring Princetta Uplinger: BESS, KATY Treating Aviella Disbrow/Extender: STONE III, HOYT Weeks in Treatment: 18 Edema Assessment Assessed: [Left: No] [Right: No] Edema: [Left: N] [Right: o] Calf Left: Right: Point of Measurement: 36 cm From Medial Instep 37.8 cm cm Ankle Left: Right: Point of Measurement: 11 cm From Medial Instep 24 cm cm Vascular Assessment Claudication: Claudication Assessment [Left:None] Pulses: Dorsalis Pedis Palpable: [Left:Yes] Posterior Tibial Extremity colors, hair growth, and conditions: Extremity Color: [Left:Normal] Hair Growth on Extremity: [Left:Yes] Temperature of Extremity: [Left:Warm] Capillary Refill: [Left:< 3 seconds] Toe Nail Assessment Left: Right: Thick: No Discolored: No Deformed: No Improper Length and Hygiene: No Electronic Signature(s) Signed: 08/27/2018 4:40:26 PM By: Roger Shelter Entered By: Roger Shelter on 08/27/2018 08:35:21 Brinton, Huntley Dec ([id number]) -------------------------------------------------------------------------------- Multi Wound Chart Details Patient Name: Marcus Loll T. Date of Service: 08/27/2018 8:15 AM Medical Record  Number: [id number] Patient Account Number: [id number] Date of Birth/Sex: 08-24-1951 (67 y.o. Male) Treating RN: Montey Hora Primary Care Jamarrius Salay: BESS, KATY Other Clinician: Referring Janziel Hockett: BESS, KATY Treating Jarrad Mclees/Extender: STONE III, HOYT Weeks in Treatment: 18 Vital Signs Height(in): 69 Pulse(bpm): 90 Weight(lbs): 238 Blood Pressure(mmHg): 127/63 Body Mass Index(BMI): 35 Temperature(F): 97.6 Respiratory Rate 16 (breaths/min): Photos: [2:No Photos] [N/A:N/A] Wound Location: [2:Left Toe Great] [N/A:N/A] Wounding Event: [2:Trauma] [N/A:N/A] Primary Etiology: [2:Diabetic Wound/Ulcer of the Lower Extremity] [N/A:N/A] Secondary Etiology: [2:Trauma, Other] [N/A:N/A] Comorbid History: [2:Lymphedema, Arrhythmia, Coronary Artery Disease, Hypertension, Myocardial Infarction, Type II Diabetes,  Neuropathy, Received Chemotherapy, Received Radiation] [N/A:N/A] Date Acquired: [2:12/25/2017] [N/A:N/A] Weeks of Treatment: [2:18] [N/A:N/A] Wound Status: [2:Open] [N/A:N/A] Pending Amputation on [2:Yes] [N/A:N/A] Presentation: Measurements L x W x D [2:0.2x0.2x0.2] [N/A:N/A] (cm) Area (cm) : [2:0.031] [N/A:N/A] Volume (cm) : [2:0.006] [N/A:N/A] % Reduction in Area: [2:34.00%] [N/A:N/A] % Reduction in Volume: [2:-20.00%] [N/A:N/A] Starting Position 1 [2:12] (o'clock): Ending Position 1 [2:12] (o'clock): Maximum Distance 1 (cm): [2:0.2] Undermining: [2:Yes] [N/A:N/A] Classification: [2:Grade 1] [N/A:N/A] Exudate Amount: [2:None Present] [N/A:N/A] Wound Margin: [2:Flat and Intact] [N/A:N/A] Granulation Amount: [2:None Present (0%)] [N/A:N/A] Necrotic Amount: [2:Medium (34-66%)] [N/A:N/A] Exposed Structures: [2:Fat Layer (Subcutaneous Tissue) Exposed: Yes] [N/A:N/A] Fascia: No Tendon: No Muscle: No Joint: No Bone: No Epithelialization: Medium (34-66%) N/A N/A Periwound Skin Texture: Callus: Yes N/A N/A Excoriation: No Induration: No Crepitus: No Rash: No Scarring:  No Periwound Skin Moisture: Maceration: No N/A N/A Dry/Scaly: No Periwound Skin Color: Atrophie Blanche: No N/A N/A Cyanosis: No Ecchymosis: No Erythema: No Hemosiderin Staining: No Mottled: No Pallor: No Rubor: No Temperature: No Abnormality N/A N/A Tenderness on Palpation: Yes N/A N/A Wound Preparation: Ulcer Cleansing: N/A N/A Rinsed/Irrigated with Saline Topical Anesthetic Applied: Other: lidocaine 4% Treatment Notes Electronic Signature(s) Signed: 08/27/2018 5:51:08 PM By: Montey Hora Entered By: Montey Hora on 08/27/2018 08:45:30 Breau, Huntley Dec ([id number]) -------------------------------------------------------------------------------- [place] Details Patient Name: [name] Date of Service: 08/27/2018 8:15 AM Medical Record Number: [id number] Patient Account Number: [id number] Date of Birth/Sex: May 04, 1951 (67 y.o. Male) Treating RN: Montey Hora Primary Care Tonye Tancredi: BESS, KATY Other Clinician: Referring Kiaja Shorty: BESS, KATY Treating Gwenevere Goga/Extender: STONE III, HOYT Weeks in Treatment: 18 Active Inactive ` Abuse / Safety / Falls / Self Care Management Nursing Diagnoses: Potential for falls Goals: Patient will remain injury free related to falls Date Initiated: 04/19/2018 Target Resolution Date: 05/04/2018 Goal Status: Active Interventions: Assess fall risk on admission and as needed Notes: ` Nutrition Nursing Diagnoses: Impaired glucose control: actual or potential Goals: Patient/caregiver agrees to and verbalizes understanding of need to use nutritional supplements and/or vitamins as prescribed Date Initiated: 04/19/2018 Target Resolution Date: 06/07/2018 Goal Status: Active Patient/caregiver verbalizes understanding of need to maintain therapeutic glucose control per primary care physician Date Initiated: 04/19/2018 Target Resolution Date: 05/31/2018 Goal Status: Active Interventions: Assess HgA1c results as  ordered upon admission and as needed Assess patient nutrition upon admission and as needed per policy Notes: ` Orientation to the Wound Care Program Nursing Diagnoses: Knowledge deficit related to the wound healing center program Goals: Patient/caregiver will verbalize understanding of the [place] JAYSHAWN, COLSTON ([id number]) Date Initiated: 04/19/2018 Target Resolution Date: 06/29/2018 Goal Status: Active Interventions: Provide education on orientation to the wound center Notes: ` Wound/Skin Impairment Nursing Diagnoses: Impaired tissue integrity Goals: Ulcer/skin breakdown will heal within 14 weeks Date Initiated: 04/19/2018 Target Resolution Date: 06/29/2018 Goal Status: Active Interventions: Assess patient/caregiver ability to obtain necessary supplies Assess patient/caregiver ability to perform ulcer/skin care regimen upon admission and as needed Assess ulceration(s) every visit Notes: Electronic Signature(s) Signed: 08/27/2018 5:51:08 PM By: Montey Hora Entered By: Montey Hora on 08/27/2018 08:45:20 Boyko, Huntley Dec ([id number]) -------------------------------------------------------------------------------- Pain Assessment Details Patient Name: Marcus Loll T. Date of Service: 08/27/2018 8:15 AM Medical Record Number: [id number] Patient Account Number: [id number] Date of Birth/Sex: 24-Dec-1951 (67 y.o. Male) Treating RN: Montey Hora Primary Care Pericles Carmicheal: BESS, KATY Other Clinician: Referring Kriti Katayama: BESS, KATY Treating Juvon Teater/Extender: STONE III, HOYT Weeks in Treatment: 18 Active Problems Location of Pain Severity and Description of Pain Patient Has Paino No  Site Locations Pain Management and Medication Current Pain Management: Electronic Signature(s) Signed: 08/27/2018 4:40:26 PM By: Roger Shelter Signed: 08/27/2018 5:51:08 PM By: Montey Hora Entered By: Roger Shelter on 08/27/2018 08:29:02 Solum, Huntley Dec  ([id number]) -------------------------------------------------------------------------------- Patient/Caregiver Education Details Patient Name: [name] Date of Service: 08/27/2018 8:15 AM Medical Record Number: [id number] Patient Account Number: [id number] Date of Birth/Gender: 02-12-1951 (67 y.o. Male) Treating RN: Cornell Barman Primary Care Physician: BESS, KATY Other Clinician: Referring Physician: Lillard Anes Treating Physician/Extender: Worthy Keeler Weeks in Treatment: 18 Education Assessment Education Provided To: Patient Education Topics Provided Wound/Skin Impairment: Handouts: Caring for Your Ulcer, Other: stay off toe as much as possible. Methods: Demonstration, Explain/Verbal Responses: State content correctly Electronic Signature(s) Signed: 08/27/2018 6:56:50 PM By: Gretta Cool, BSN, RN, CWS, Kim RN, BSN Entered By: Gretta Cool, BSN, RN, CWS, Kim on 08/27/2018 09:02:01 Hudock, Huntley Dec ([id number]) -------------------------------------------------------------------------------- Wound Assessment Details Patient Name: [name] Date of Service: 08/27/2018 8:15 AM Medical Record Number: [id number] Patient Account Number: [id number] Date of Birth/Sex: 1951-03-31 (67 y.o. Male) Treating RN: Roger Shelter Primary Care Bartholomew Ramesh: BESS, KATY Other Clinician: Referring Khasir Woodrome: BESS, KATY Treating Paulyne Mooty/Extender: STONE III, HOYT Weeks in Treatment: 18 Wound Status Wound Number: 2 Primary Diabetic Wound/Ulcer of the Lower Extremity Etiology: Wound Location: Left Toe Great Secondary Trauma, Other Wounding Event: Trauma Etiology: Date Acquired: 12/25/2017 Wound Open Weeks Of Treatment: 18 Status: Clustered Wound: No Comorbid Lymphedema, Arrhythmia, Coronary Artery Pending Amputation On Presentation History: Disease, Hypertension, Myocardial Infarction, Type II Diabetes, Neuropathy, Received Chemotherapy, Received Radiation Photos Photo Uploaded By: Roger Shelter  on 08/27/2018 14:34:58 Wound Measurements Length: (cm) 0.2 Width: (cm) 0.2 Depth: (cm) 0.2 Area: (cm) 0.031 Volume: (cm) 0.006 % Reduction in Area: 34% % Reduction in Volume: -20% Epithelialization: Medium (34-66%) Tunneling: No Undermining: Yes Starting Position (o'clock): 12 Ending Position (o'clock): 12 Maximum Distance: (cm) 0.2 Wound Description Classification: Grade 1 Wound Margin: Flat and Intact Exudate Amount: None Present Foul Odor After Cleansing: No Slough/Fibrino Yes Wound Bed Granulation Amount: None Present (0%) Exposed Structure Necrotic Amount: Medium (34-66%) Fascia Exposed: No Necrotic Quality: Adherent Slough Fat Layer (Subcutaneous Tissue) Exposed: Yes Tendon Exposed: No Scalf, Devaunte T. ([id number]) Muscle Exposed: No Joint Exposed: No Bone Exposed: No Periwound Skin Texture Texture Color No Abnormalities Noted: No No Abnormalities Noted: No Callus: Yes Atrophie Blanche: No Crepitus: No Cyanosis: No Excoriation: No Ecchymosis: No Induration: No Erythema: No Rash: No Hemosiderin Staining: No Scarring: No Mottled: No Pallor: No Moisture Rubor: No No Abnormalities Noted: No Dry / Scaly: No Temperature / Pain Maceration: No Temperature: No Abnormality Tenderness on Palpation: Yes Wound Preparation Ulcer Cleansing: Rinsed/Irrigated with Saline Topical Anesthetic Applied: Other: lidocaine 4%, Treatment Notes Wound #2 (Left Toe Great) 4. Dressing Applied: Other dressing (specify in notes) 5. Secondary Dressing Applied Foam 6. Footwear/Offloading device applied Surgical shoe 7. Secured with Other (specify in notes) Notes SIlvercell, foam, secured with conform and tape. darco with peg assist Electronic Signature(s) Signed: 08/27/2018 4:40:26 PM By: Roger Shelter Entered By: Roger Shelter on 08/27/2018 08:33:21 Polinski, Huntley Dec  ([id number]) -------------------------------------------------------------------------------- Vitals Details Patient Name: [name] Date of Service: 08/27/2018 8:15 AM Medical Record Number: [id number] Patient Account Number: [id number] Date of Birth/Sex: 06/02/1951 (67 y.o. Male) Treating RN: Montey Hora Primary Care Alanta Scobey: BESS, KATY Other Clinician: Referring Roger Fasnacht: BESS, KATY Treating Tenia Goh/Extender: STONE III, HOYT Weeks in Treatment: 18 Vital Signs Time Taken: 08:15 Temperature (F): 97.6 Height (in): 69 Pulse (bpm): 90 Weight (lbs): 238 Respiratory Rate (breaths/min): 16  Body Mass Index (BMI): 35.1 Blood Pressure (mmHg): 127/63 Reference Range: 80 - 120 mg / dl Electronic Signature(s) Signed: 08/27/2018 4:40:26 PM By: Roger Shelter Entered By: Roger Shelter on 08/27/2018 08:29:05

## 2018-08-30 NOTE — Patient Instructions (Addendum)
ARJUNA DOEDEN  08/30/2018   Your procedure is scheduled on: 09-12-17  Report to [facility] Main  Entrance  Report to admitting at 800 AM    Call this number if you have problems the morning of surgery [phone]   Remember: Do not eat food or drink liquids :After Midnight.     Take these medicines the morning of surgery with A SIP OF WATER: gabapentin (neurontin), bupropion (wellbutrin xl), atorvastatin (l;ipitor), finasteride (proscar), rapaflo, nicotine patch DO NOT TAKE ANY DIABETIC MEDICATIONS DAY OF YOUR SURGERY                How to Manage Your Diabetes Before and After Surgery  Why is it important to control my blood sugar before and after surgery? . Improving blood sugar levels before and after surgery helps healing and can limit problems. . A way of improving blood sugar control is eating a healthy diet by: o  Eating less sugar and carbohydrates o  Increasing activity/exercise o  Talking with your doctor about reaching your blood sugar goals . High blood sugars (greater than 180 mg/dL) can raise your risk of infections and slow your recovery, so you will need to focus on controlling your diabetes during the weeks before surgery. . Make sure that the doctor who takes care of your diabetes knows about your planned surgery including the date and location.  How do I manage my blood sugar before surgery? . Check your blood sugar at least 4 times a day, starting 2 days before surgery, to make sure that the level is not too high or low. o Check your blood sugar the morning of your surgery when you wake up and every 2 hours until you get to the Short Stay unit. . If your blood sugar is less than 70 mg/dL, you will need to treat for low blood sugar: o Do not take insulin. o Treat a low blood sugar (less than 70 mg/dL) with  cup of clear juice (cranberry or apple), 4 glucose tablets, OR glucose gel. o Recheck blood sugar in 15 minutes after treatment (to  make sure it is greater than 70 mg/dL). If your blood sugar is not greater than 70 mg/dL on recheck, call [phone] for further instructions. . Report your blood sugar to the short stay nurse when you get to Short Stay.  . If you are admitted to the hospital after surgery: o Your blood sugar will be checked by the staff and you will probably be given insulin after surgery (instead of oral diabetes medicines) to make sure you have good blood sugar levels. o The goal for blood sugar control after surgery is 80-180 mg/dL.   WHAT DO I DO ABOUT MY DIABETES MEDICATION?  Marland Kitchen Do not take oral diabetes medicines (pills) the morning of surgery.  . THE DAY  BEFORE SURGERY TAKE METFORMIN AND FARXIGA AS USUAL    . THE MORNING OF SURGERY, DO NOT TAKE METFORMIN AND FARXIGA    FPatient Signature:  Date:   Nurse Signature:  Date:   Reviewed and Endorsed by [facility] Patient Education Committee, August 2015               You may not have any metal on your body including hair pins and              piercings  Do not wear jewelry, make-up, lotions, powders or  perfumes, deodorant             Do not wear nail polish.  Do not shave  48 hours prior to surgery.              Men may shave face and neck.   Do not bring valuables to the hospital. [place].  Contacts, dentures or bridgework may not be worn into surgery.  Leave suitcase in the car. After surgery it may be brought to your room.     Patients discharged the day of surgery will not be allowed to drive home.  Name and phone number of your driver: gerry wife cell [phone]  Special Instructions: N/A              Please read over the following fact sheets you were given: _____________________________________________________________________  [facility] - Preparing for Surgery Before surgery, you can play an important role.  Because skin is not sterile, your skin needs to be as free of germs  as possible.  You can reduce the number of germs on your skin by washing with CHG (chlorahexidine gluconate) soap before surgery.  CHG is an antiseptic cleaner which kills germs and bonds with the skin to continue killing germs even after washing. Please DO NOT use if you have an allergy to CHG or antibacterial soaps.  If your skin becomes reddened/irritated stop using the CHG and inform your nurse when you arrive at Short Stay. Do not shave (including legs and underarms) for at least 48 hours prior to the first CHG shower.  You may shave your face/neck. Please follow these instructions carefully:  1.  Shower with CHG Soap the night before surgery and the  morning of Surgery.  2.  If you choose to wash your hair, wash your hair first as usual with your  normal  shampoo.  3.  After you shampoo, rinse your hair and body thoroughly to remove the  shampoo.                           4.  Use CHG as you would any other liquid soap.  You can apply chg directly  to the skin and wash                       Gently with a scrungie or clean washcloth.  5.  Apply the CHG Soap to your body ONLY FROM THE NECK DOWN.   Do not use on face/ open                           Wound or open sores. Avoid contact with eyes, ears mouth and genitals (private parts).                       Wash face,  Genitals (private parts) with your normal soap.             6.  Wash thoroughly, paying special attention to the area where your surgery  will be performed.  7.  Thoroughly rinse your body with warm water from the neck down.  8.  DO NOT shower/wash with your normal soap after using and rinsing off  the CHG Soap.  9.  Pat yourself dry with a clean towel.            10.  Wear clean pajamas.            11.  Place clean sheets on your bed the night of your first shower and do not  sleep with pets. Day of Surgery : Do not apply any lotions/deodorants the morning of surgery.  Please wear clean clothes to the hospital/surgery  center.

## 2018-08-30 NOTE — Progress Notes (Signed)
CARDIAC CLEARANCE NOTE Marcus Sims NP 08-22-18 LOOP RECORDER LAST CHECK NOTE 06-18-18 Epic EKG 08-22-18 Epic CHEST CT 03-20-18 Epic

## 2018-09-02 DIAGNOSIS — R310 Gross hematuria: Secondary | ICD-10-CM | POA: Diagnosis not present

## 2018-09-02 DIAGNOSIS — N200 Calculus of kidney: Secondary | ICD-10-CM | POA: Diagnosis not present

## 2018-09-02 LAB — CUP PACEART REMOTE DEVICE CHECK
Date Time Interrogation Session: 20190729004002
MDC IDC PG IMPLANT DT: 20170821

## 2018-09-03 ENCOUNTER — Telehealth: Payer: Self-pay

## 2018-09-03 ENCOUNTER — Encounter: Payer: PPO | Admitting: Physician Assistant

## 2018-09-03 DIAGNOSIS — E11621 Type 2 diabetes mellitus with foot ulcer: Secondary | ICD-10-CM | POA: Diagnosis not present

## 2018-09-03 DIAGNOSIS — L97522 Non-pressure chronic ulcer of other part of left foot with fat layer exposed: Secondary | ICD-10-CM | POA: Diagnosis not present

## 2018-09-03 NOTE — Telephone Encounter (Signed)
Spoke w/ pt and requested that he send a manual transmission b/c his home monitor has not updated in at least 14 days.   

## 2018-09-04 ENCOUNTER — Encounter (HOSPITAL_COMMUNITY)
Admission: RE | Admit: 2018-09-04 | Discharge: 2018-09-04 | Disposition: A | Discharge status: Home / Self Care | Payer: PPO | Source: Ambulatory Visit | Attending: Urology | Admitting: Urology

## 2018-09-04 ENCOUNTER — Other Ambulatory Visit: Payer: Self-pay

## 2018-09-04 ENCOUNTER — Encounter (HOSPITAL_COMMUNITY): Payer: Self-pay

## 2018-09-04 DIAGNOSIS — Z01812 Encounter for preprocedural laboratory examination: Secondary | ICD-10-CM | POA: Diagnosis not present

## 2018-09-04 DIAGNOSIS — R319 Hematuria, unspecified: Secondary | ICD-10-CM | POA: Diagnosis not present

## 2018-09-04 DIAGNOSIS — N200 Calculus of kidney: Secondary | ICD-10-CM | POA: Insufficient documentation

## 2018-09-04 HISTORY — DX: Hematuria, unspecified: R31.9

## 2018-09-04 HISTORY — DX: Abdominal aortic aneurysm, without rupture, unspecified: I71.40

## 2018-09-04 HISTORY — DX: Other injury of unspecified body region, initial encounter: T14.8XXA

## 2018-09-04 HISTORY — DX: Abdominal aortic aneurysm, without rupture: I71.4

## 2018-09-04 LAB — BASIC METABOLIC PANEL
ANION GAP: 11 (ref 5–15)
BUN: 13 mg/dL (ref 8–23)
CO2: 26 mmol/L (ref 22–32)
Calcium: 9.6 mg/dL (ref 8.9–10.3)
Chloride: 104 mmol/L (ref 98–111)
Creatinine, Ser: 0.79 mg/dL (ref 0.61–1.24)
GLUCOSE: 165 mg/dL — AB (ref 70–99)
Potassium: 3.8 mmol/L (ref 3.5–5.1)
Sodium: 141 mmol/L (ref 135–145)

## 2018-09-04 LAB — CBC
HEMATOCRIT: 43.2 % (ref 39.0–52.0)
Hemoglobin: 14.5 g/dL (ref 13.0–17.0)
MCH: 29.5 pg (ref 26.0–34.0)
MCHC: 33.6 g/dL (ref 30.0–36.0)
MCV: 87.8 fL (ref 78.0–100.0)
Platelets: 238 10*3/uL (ref 150–400)
RBC: 4.92 MIL/uL (ref 4.22–5.81)
RDW: 15.1 % (ref 11.5–15.5)
WBC: 13.2 10*3/uL — ABNORMAL HIGH (ref 4.0–10.5)

## 2018-09-04 LAB — GLUCOSE, CAPILLARY: Glucose-Capillary: 205 mg/dL — ABNORMAL HIGH (ref 70–99)

## 2018-09-04 NOTE — Progress Notes (Signed)
WYETH, HOFFER ([id number]) Visit Report for 09/03/2018 Arrival Information Details Patient Name: [name], [name]. Date of Service: 09/03/2018 8:30 AM Medical Record Number: [id number] Patient Account Number: [id number] Date of Birth/Sex: Aug 16, 1951 (67 y.o. M) Treating RN: Roger Shelter Primary Care Gareth Fitzner: BESS, KATY Other Clinician: Referring Kellene Mccleary: BESS, KATY Treating Korbin Mapps/Extender: STONE III, HOYT Weeks in Treatment: 74 Visit Information History Since Last Visit All ordered tests and consults were completed: No Patient Arrived: Ambulatory Added or deleted any medications: No Arrival Time: 08:30 Any new allergies or adverse reactions: No Accompanied By: self Had a fall or experienced change in No Transfer Assistance: None activities of daily living that may affect Patient Identification Verified: Yes risk of falls: Secondary Verification Process Completed: Yes Signs or symptoms of abuse/neglect since last visito No Patient Requires Transmission-Based No Hospitalized since last visit: No Precautions: Implantable device outside of the clinic excluding No Patient Has Alerts: Yes cellular tissue based products placed in the center Patient Alerts: Type II since last visit: Diabetic Pain Present Now: No Electronic Signature(s) Signed: 09/03/2018 4:32:14 PM By: Roger Shelter Entered By: Roger Shelter on 09/03/2018 08:31:29 [place], Huntley Dec ([id number]) -------------------------------------------------------------------------------- Encounter Discharge Information Details Patient Name: Marcus Loll T. Date of Service: 09/03/2018 8:30 AM Medical Record Number: [id number] Patient Account Number: [id number] Date of Birth/Sex: [birth year] (67 y.o. M) Treating RN: Roger Shelter Primary Care Georgian Mcclory: BESS, KATY Other Clinician: Referring Turner Kunzman: BESS, KATY Treating Faline Langer/Extender: Melburn Hake, HOYT Weeks in Treatment: 19 Encounter Discharge Information  Items Discharge Condition: Stable Ambulatory Status: Ambulatory Discharge Destination: Home Transportation: Private Auto Schedule Follow-up Appointment: Yes Clinical Summary of Care: Electronic Signature(s) Signed: 09/03/2018 4:32:14 PM By: Roger Shelter Entered By: Roger Shelter on 09/03/2018 09:16:28 Marcus Lark ([id number]) -------------------------------------------------------------------------------- Lower Extremity Assessment Details Patient Name: Marcus Loll T. Date of Service: 09/03/2018 8:30 AM Medical Record Number: [id number] Patient Account Number: [id number] Date of Birth/Sex: 1951/05/24 (67 y.o. M) Treating RN: Roger Shelter Primary Care Dorrine Montone: BESS, KATY Other Clinician: Referring Sequoia Mincey: BESS, KATY Treating Aryella Besecker/Extender: STONE III, HOYT Weeks in Treatment: 19 Edema Assessment Assessed: [Left: No] [Right: No] Edema: [Left: Ye] [Right: s] Calf Left: Right: Point of Measurement: 36 cm From Medial Instep 37.5 cm cm Ankle Left: Right: Point of Measurement: 11 cm From Medial Instep 24.6 cm cm Vascular Assessment Claudication: Claudication Assessment [Left:None] Pulses: Dorsalis Pedis Palpable: [Left:Yes] Posterior Tibial Extremity colors, hair growth, and conditions: Extremity Color: [Left:Hyperpigmented] Hair Growth on Extremity: [Left:No] Temperature of Extremity: [Left:Warm] Capillary Refill: [Left:< 3 seconds] Toe Nail Assessment Left: Right: Thick: No Discolored: No Deformed: No Improper Length and Hygiene: No Electronic Signature(s) Signed: 09/03/2018 4:32:14 PM By: Roger Shelter Entered By: Roger Shelter on 09/03/2018 08:38:01 Tetrick, Huntley Dec ([id number]) -------------------------------------------------------------------------------- Multi Wound Chart Details Patient Name: Marcus Loll T. Date of Service: 09/03/2018 8:30 AM Medical Record Number: [id number] Patient Account Number: [id number] Date of Birth/Sex:  [birth year] (67 y.o. M) Treating RN: Montey Hora Primary Care Holdyn Poyser: BESS, KATY Other Clinician: Referring Tequila Rottmann: BESS, KATY Treating Shaketha Jeon/Extender: STONE III, HOYT Weeks in Treatment: 19 Vital Signs Height(in): 69 Pulse(bpm): 80 Weight(lbs): 238 Blood Pressure(mmHg): 121/62 Body Mass Index(BMI): 35 Temperature(F): 97.8 Respiratory Rate 16 (breaths/min): Photos: [2:No Photos] [N/A:N/A] Wound Location: [2:Left Toe Great] [N/A:N/A] Wounding Event: [2:Trauma] [N/A:N/A] Primary Etiology: [2:Diabetic Wound/Ulcer of the Lower Extremity] [N/A:N/A] Secondary Etiology: [2:Trauma, Other] [N/A:N/A] Comorbid History: [2:Lymphedema, Arrhythmia, Coronary Artery Disease, Hypertension, Myocardial Infarction, Type II Diabetes, Neuropathy, Received Chemotherapy, Received Radiation] [N/A:N/A] Date Acquired: [2:12/25/2017] [N/A:N/A] Weeks of Treatment: [2:19] [N/A:N/A] Wound Status: [  2:Open] [N/A:N/A] Pending Amputation on [2:Yes] [N/A:N/A] Presentation: Measurements L x W x D [2:0.2x0.2x0.2] [N/A:N/A] (cm) Area (cm) : [2:0.031] [N/A:N/A] Volume (cm) : [2:0.006] [N/A:N/A] % Reduction in Area: [2:34.00%] [N/A:N/A] % Reduction in Volume: [2:-20.00%] [N/A:N/A] Starting Position 1 [2:12] (o'clock): Ending Position 1 [2:12] (o'clock): Maximum Distance 1 (cm): [2:0.2] Undermining: [2:Yes] [N/A:N/A] Classification: [2:Grade 1] [N/A:N/A] Exudate Amount: [2:Small] [N/A:N/A] Exudate Type: [2:Serous] [N/A:N/A] Exudate Color: [2:amber] [N/A:N/A] Wound Margin: [2:Flat and Intact] [N/A:N/A] Granulation Amount: [2:Medium (34-66%)] [N/A:N/A] Granulation Quality: [2:Pink] [N/A:N/A] Necrotic Amount: Medium (34-66%) N/A N/A Exposed Structures: Fat Layer (Subcutaneous N/A N/A Tissue) Exposed: Yes Fascia: No Tendon: No Muscle: No Joint: No Bone: No Epithelialization: Medium (34-66%) N/A N/A Periwound Skin Texture: Callus: Yes N/A N/A Excoriation: No Induration: No Crepitus: No Rash:  No Scarring: No Periwound Skin Moisture: Maceration: No N/A N/A Dry/Scaly: No Periwound Skin Color: Atrophie Blanche: No N/A N/A Cyanosis: No Ecchymosis: No Erythema: No Hemosiderin Staining: No Mottled: No Pallor: No Rubor: No Temperature: No Abnormality N/A N/A Tenderness on Palpation: Yes N/A N/A Wound Preparation: Ulcer Cleansing: N/A N/A Rinsed/Irrigated with Saline Topical Anesthetic Applied: Other: lidocaine 4% Treatment Notes Electronic Signature(s) Signed: 09/03/2018 4:54:17 PM By: Montey Hora Entered By: Montey Hora on 09/03/2018 08:59:22 [place], Huntley Dec ([id number]) -------------------------------------------------------------------------------- [place] Details Patient Name: Marcus Lark. Date of Service: 09/03/2018 8:30 AM Medical Record Number: [id number] Patient Account Number: [id number] Date of Birth/Sex: 1951/06/11 (67 y.o. M) Treating RN: Montey Hora Primary Care Diyari Cherne: BESS, KATY Other Clinician: Referring Mccabe Gloria: BESS, KATY Treating Amit Meloy/Extender: STONE III, HOYT Weeks in Treatment: 19 Active Inactive ` Abuse / Safety / Falls / Self Care Management Nursing Diagnoses: Potential for falls Goals: Patient will remain injury free related to falls Date Initiated: 04/19/2018 Target Resolution Date: 05/04/2018 Goal Status: Active Interventions: Assess fall risk on admission and as needed Notes: ` Nutrition Nursing Diagnoses: Impaired glucose control: actual or potential Goals: Patient/caregiver agrees to and verbalizes understanding of need to use nutritional supplements and/or vitamins as prescribed Date Initiated: 04/19/2018 Target Resolution Date: 06/07/2018 Goal Status: Active Patient/caregiver verbalizes understanding of need to maintain therapeutic glucose control per primary care physician Date Initiated: 04/19/2018 Target Resolution Date: 05/31/2018 Goal Status: Active Interventions: Assess HgA1c  results as ordered upon admission and as needed Assess patient nutrition upon admission and as needed per policy Notes: ` Orientation to the Wound Care Program Nursing Diagnoses: Knowledge deficit related to the wound healing center program Goals: Patient/caregiver will verbalize understanding of the [place] HERMES, WAFER ([id number]) Date Initiated: 04/19/2018 Target Resolution Date: 06/29/2018 Goal Status: Active Interventions: Provide education on orientation to the wound center Notes: ` Wound/Skin Impairment Nursing Diagnoses: Impaired tissue integrity Goals: Ulcer/skin breakdown will heal within 14 weeks Date Initiated: 04/19/2018 Target Resolution Date: 06/29/2018 Goal Status: Active Interventions: Assess patient/caregiver ability to obtain necessary supplies Assess patient/caregiver ability to perform ulcer/skin care regimen upon admission and as needed Assess ulceration(s) every visit Notes: Electronic Signature(s) Signed: 09/03/2018 4:54:17 PM By: Montey Hora Entered By: Montey Hora on 09/03/2018 08:59:14 [place]ber[place]on, Huntley Dec ([id number]) -------------------------------------------------------------------------------- Pain Assessment Details Patient Name: Marcus Loll T. Date of Service: 09/03/2018 8:30 AM Medical Record Number: [id number] Patient Account Number: [id number] Date of Birth/Sex: 1951-10-07 (67 y.o. M) Treating RN: Roger Shelter Primary Care Anjalee Cope: BESS, KATY Other Clinician: Referring Maycol Hoying: BESS, KATY Treating Arlene Genova/Extender: STONE III, HOYT Weeks in Treatment: 19 Active Problems Location of Pain Severity and Description of Pain Patient Has Paino No Site Locations Pain Management and  Medication Current Pain Management: Electronic Signature(s) Signed: 09/03/2018 4:32:14 PM By: Roger Shelter Entered By: Roger Shelter on 09/03/2018 08:31:35 Altmann, Huntley Dec  ([id number]) -------------------------------------------------------------------------------- Patient/Caregiver Education Details Patient Name: Marcus Lark. Date of Service: 09/03/2018 8:30 AM Medical Record Number: [id number] Patient Account Number: [id number] Date of Birth/Gender: 1951-03-26 (67 y.o. M) Treating RN: Roger Shelter Primary Care Physician: BESS, KATY Other Clinician: Referring Physician: Lillard Anes Treating Physician/Extender: Melburn Hake, HOYT Weeks in Treatment: 19 Education Assessment Education Provided To: Patient Education Topics Provided Wound Debridement: Handouts: Wound Debridement Methods: Explain/Verbal Responses: State content correctly Wound/Skin Impairment: Handouts: Caring for Your Ulcer Methods: Explain/Verbal Responses: State content correctly Electronic Signature(s) Signed: 09/03/2018 4:32:14 PM By: Roger Shelter Entered By: Roger Shelter on 09/03/2018 09:16:43 Ebbert, Huntley Dec ([id number]) -------------------------------------------------------------------------------- Wound Assessment Details Patient Name: Marcus Loll T. Date of Service: 09/03/2018 8:30 AM Medical Record Number: [id number] Patient Account Number: [id number] Date of Birth/Sex: 07/03/1951 (67 y.o. M) Treating RN: Roger Shelter Primary Care Shahmeer Bunn: BESS, KATY Other Clinician: Referring Leelyn Jasinski: BESS, KATY Treating Chavie Kolinski/Extender: STONE III, HOYT Weeks in Treatment: 19 Wound Status Wound Number: 2 Primary Diabetic Wound/Ulcer of the Lower Extremity Etiology: Wound Location: Left Toe Great Secondary Trauma, Other Wounding Event: Trauma Etiology: Date Acquired: 12/25/2017 Wound Open Weeks Of Treatment: 19 Status: Clustered Wound: No Comorbid Lymphedema, Arrhythmia, Coronary Artery Pending Amputation On Presentation History: Disease, Hypertension, Myocardial Infarction, Type II Diabetes, Neuropathy, Received Chemotherapy, Received  Radiation Photos Photo Uploaded By: Roger Shelter on 09/03/2018 10:16:42 Wound Measurements Length: (cm) 0.2 Width: (cm) 0.2 Depth: (cm) 0.2 Area: (cm) 0.031 Volume: (cm) 0.006 % Reduction in Area: 34% % Reduction in Volume: -20% Epithelialization: Medium (34-66%) Tunneling: No Undermining: Yes Starting Position (o'clock): 12 Ending Position (o'clock): 12 Maximum Distance: (cm) 0.2 Wound Description Classification: Grade 1 Wound Margin: Flat and Intact Exudate Amount: Small Exudate Type: Serous Exudate Color: amber Foul Odor After Cleansing: No Slough/Fibrino Yes Wound Bed Granulation Amount: Medium (34-66%) Exposed Structure Granulation Quality: Pink Fascia Exposed: No Boger, Huntley Dec ([id number]) Necrotic Amount: Medium (34-66%) Fat Layer (Subcutaneous Tissue) Exposed: Yes Necrotic Quality: Adherent Slough Tendon Exposed: No Muscle Exposed: No Joint Exposed: No Bone Exposed: No Periwound Skin Texture Texture Color No Abnormalities Noted: No No Abnormalities Noted: No Callus: Yes Atrophie Blanche: No Crepitus: No Cyanosis: No Excoriation: No Ecchymosis: No Induration: No Erythema: No Rash: No Hemosiderin Staining: No Scarring: No Mottled: No Pallor: No Moisture Rubor: No No Abnormalities Noted: No Dry / Scaly: No Temperature / Pain Maceration: No Temperature: No Abnormality Tenderness on Palpation: Yes Wound Preparation Ulcer Cleansing: Rinsed/Irrigated with Saline Topical Anesthetic Applied: Other: lidocaine 4%, Treatment Notes Wound #2 (Left Toe Great) 1. Cleansed with: Clean wound with Normal Saline 2. Anesthetic Topical Lidocaine 4% cream to wound bed prior to debridement 4. Dressing Applied: Prisma Ag Notes SIlvercell, foam, secured with conform and tape. darco with peg assist Electronic Signature(s) Signed: 09/03/2018 4:32:14 PM By: Roger Shelter Entered By: Roger Shelter on 09/03/2018 08:36:38 Neuroth, Huntley Dec  ([id number]) -------------------------------------------------------------------------------- [place] Details Patient Name: Marcus Lark. Date of Service: 09/03/2018 8:30 AM Medical Record Number: [id number] Patient Account Number: [id number] Date of Birth/Sex: 04-03-1951 (67 y.o. M) Treating RN: Roger Shelter Primary Care Daniyal Tabor: BESS, KATY Other Clinician: Referring Aman Bonet: BESS, KATY Treating Grae Cannata/Extender: STONE III, HOYT Weeks in Treatment: 19 Vital Signs Time Taken: 08:31 Temperature (F): 97.8 Height (in): 69 Pulse (bpm): 80 Weight (lbs): 238 Respiratory Rate (breaths/min): 16 Body Mass Index (BMI): 35.1 Blood Pressure (mmHg): 121/62 Reference Range: 80 -  120 mg / dl Electronic Signature(s) Signed: 09/03/2018 4:32:14 PM By: Roger Shelter Entered By: Roger Shelter on 09/03/2018 08:32:34

## 2018-09-04 NOTE — Progress Notes (Signed)
JABEZ, MOLNER ([id number]) Visit Report for 09/03/2018 Chief Complaint Document Details Patient Name: [name], [name]. Date of Service: 09/03/2018 8:30 AM Medical Record Number: [id number] Patient Account Number: [id number] Date of Birth/Sex: Apr 12, 1951 (67 y.o. M) Treating RN: Montey Hora Primary Care Provider: BESS, Valetta Fuller Other Clinician: Referring Provider: BESS, KATY Treating Provider/Extender: STONE III, Dominick Zertuche Weeks in Treatment: 19 Information Obtained from: Patient Chief Complaint Left lower leg and left great toe ulcer Electronic Signature(s) Signed: 09/03/2018 1:59:57 PM By: Worthy Keeler PA-C Entered By: Worthy Keeler on 09/03/2018 08:43:34 Mcdougall, [name] ([id number]) -------------------------------------------------------------------------------- Debridement Details Patient Name: [name]. Date of Service: 09/03/2018 8:30 AM Medical Record Number: [id number] Patient Account Number: [id number] Date of Birth/Sex: [birth year] (67 y.o. M) Treating RN: Montey Hora Primary Care Provider: BESS, KATY Other Clinician: Referring Provider: BESS, KATY Treating Provider/Extender: STONE III, Oaklee Sunga Weeks in Treatment: 19 Debridement Performed for Wound #2 Left Toe Great Assessment: Performed By: Physician STONE III, Mathius Birkeland E., PA-C Debridement Type: Debridement Severity of Tissue Pre Fat layer exposed Debridement: Pre-procedure Verification/Time Yes - 09:01 Out Taken: Start Time: 09:01 Pain Control: Lidocaine 4% Topical Solution Total Area Debrided (L x W): 0.2 (cm) x 0.2 (cm) = 0.04 (cm) Tissue and other material Viable, Non-Viable, Callus, Slough, Subcutaneous, Slough debrided: Level: Skin/Subcutaneous Tissue Debridement Description: Excisional Instrument: Curette Bleeding: Minimum Hemostasis Achieved: Pressure End Time: 09:03 Procedural Pain: 0 Post Procedural Pain: 0 Response to Treatment: Procedure was tolerated well Level of Consciousness: Awake  and Alert Post Debridement Measurements of Total Wound Length: (cm) 0.2 Width: (cm) 0.2 Depth: (cm) 0.6 Volume: (cm) 0.019 Character of Wound/Ulcer Post Debridement: Improved Severity of Tissue Post Debridement: Fat layer exposed Post Procedure Diagnosis Same as Pre-procedure Electronic Signature(s) Signed: 09/03/2018 1:59:57 PM By: Worthy Keeler PA-C Signed: 09/03/2018 4:54:17 PM By: Montey Hora Entered By: Montey Hora on 09/03/2018 09:03:43 Nuccio, [name] ([id number]) -------------------------------------------------------------------------------- HPI Details Patient Name: Marcus Loll T. Date of Service: 09/03/2018 8:30 AM Medical Record Number: [id number] Patient Account Number: [id number] Date of Birth/Sex: July 30, 1951 (67 y.o. M) Treating RN: Montey Hora Primary Care Provider: BESS, KATY Other Clinician: Referring Provider: BESS, KATY Treating Provider/Extender: STONE III, Akayla Brass Weeks in Treatment: 19 History of Present Illness Associated Signs and Symptoms: Patient has a history of chronic venous insufficiency, diabetes mellitus type II, its reform fraction, hypertension HPI Description: 04/19/18 on evaluation today patient presents initially concerning an ulcer of his left great toe and left anterior shin. He states that the shin was definitely a trauma injury where he struck this on a metal pole injuring leg. In regard to the distal left great toe he's really not sure what happened here although he does have a significant callous noted as well. He does have a pacemaker. Fortunately his ABI was 0.98 on the right and 0.83 on the left this appears to be doing fairly well. Overall I'm pleased with that. Nonetheless he states that this really has not seem to be improving. He has been tolerating the dressing changes without complication. The left anterior leg ulcer began on 04/05/18 according to what he tells [place] today although one note I did have for review said it was  April 5. I'm inclined to believe it was the fifth she did have an office visit on 04/01/18 with his family nurse practitioner Faith Rogue. Nonetheless either way it's been going on this month for several weeks. The left great toe has actually been present since January 1 he does not know how this  occurred. Fortunately has no pain at the site although he does have pain on the left anterior shin. His hemoglobin A1c is 8.0 in March 2019. 04/26/18 on evaluation today patient presents for follow-up concerning his life into your lower [place] also as well as his left great toe also. Fortunately the shin actually appears to be doing better on evaluation today in my opinion I'm not seeing as much in the way of fluffernutter on the surface although it does still require debridement today this is improved. Size wise it's really not much different. With that being said the total ulcer actually appears to be a little bit more macerated today I'm not sure why they state that several days ago when this was changed that was not the case. Fortunately is not having severe pain although both areas do hurt the shin is worse. 05/03/18 on evaluation today patient appears to be doing better in regard to his left first toe and left shin ulcers. He is been tolerating the dressing changes without complication and the good news is this seems to be showing signs of getting better. Overall I'm very pleased with the progress that has been made over the past week. Patient is still having pain especially in regard to the left shin. 05/10/18 on evaluation today patient appears to be doing rather well in regard to his two ulcers. The toe ulcer appears to be smaller he does have some undermining however in this had to be cleaned out just a little bit. With that being said overall I feel like this is showing signs of improvement. The left anterior shin ulcer also showing signs of improvement at this time. There does not appear to be any  evidence of infection which is good news. 05/21/18 on evaluation today patient appears to be doing very well in regard to his left shin ulcer. He has been tolerating the dressing changes without complication. With that being said he is having issues at this point with his great toe on the left. We have not x-rayed that at this point I think we may need to. Nonetheless he has pain really at the roughly 5 o'clock location but nowhere else and I really cannot explain this I do not see any obvious form body. Nonetheless he continues to have issues with getting this area to close it's not progressing as nicely. 05/28/18 on evaluation today patient appears to be doing very well in regard to his left lower extremity anterior ulcer. He has been tolerating the dressing changes without complication. Overall this is making excellent progress with the collagen dressing. His left great toe x-ray did return and showed that he had no if you that normality. He did have a plantar heel spur but this is more degenerative. Otherwise just arthritis was noted no osteomyelitis and no obvious form body was visualized. Overall things seem to be going fairly well. With that being said he still has pain when looking at the toe from the plantar aspect at roughly the six back to the 4 o'clock location which is not quite as bad as last time but still hurts more than any other part of his wound. It does appear the Iodoflex has been of benefit however. 06/04/18 on evaluation today patient appears to be doing excellent in regard to his left anterior shin ulcer. This has made excellent progress and is showing signs of improving in fact very close to completely closing in healing. Nonetheless unfortunately his left great toe has not faired quite as  well as far as progress is concerned. He notes that after last week's Hummel, Marcus GILARDI. ([id number]) debridement he actually seem to be doing a little bit better for a couple of days until  Thursday of last week when he had increases in discomfort as well is changes in the draining. He states that the drainage has been more green in color according to his wife and what he has seen. He's also had more discomfort. He relates to me again that he really feels like this all started when he stepped on a nail although he does not exactly remember it it seemed to be a puncture wound that he feels may be the culprit for why all this started. Nonetheless I am getting more concerned about the possibility of osteomyelitis despite the fact that the x-ray was negative I was hopeful in this interim since last week to this week that we would see some improvement in the overall appearance of the toe. Unfortunately if anything I feel like it's a little worse. It was macerated but I think we can attribute this to the fact that the patient did have an episode last night we got stuck in the rain and he thinks the dressing got wet. He did not change it I told him in the future if this were to happen to please go ahead and change the dressing it won't hurt to change it more frequently in that situation. Otherwise other than the maceration I do not see any evidence of anything being any worse but it also does not appear to be any better. 06/11/18 on evaluation today patient's wounds both the great toe as well as the left shin both on the left side show signs of good improvement. Fortunately the patient's culture came back negative in regard to the toe and there does not appear to be any evidence of infection. His MRI is actually scheduled for 06/19/18. Fortunately this is right before I see him next week and then we can go to the results of the culture at that point. Patient is in agreement with that plan. With that being said otherwise he has been showing signs of having less pain in regard to the toe which is also good news my hope is that he does not have osteomyelitis. 06/18/18 on evaluation today patient  appears to be doing very well in regard to his left lower extremity ulcer. He also states at this point in time that he has been tolerating the dressing changes with the toe it appears also to be doing very well at this point. In general I'm happy with the progress that he seems to be making. With that being said he still continues to have pain in regard to the great toe ulcer fortunately this does not seem to be as significant as it was in the beginning but still nonetheless he is having some pain. No fevers, chills, nausea, or vomiting noted at this time. 06/25/18 on evaluation today patient actually appears to be doing very well in regard to his left anterior shin ulcer. He's been tolerating the dressing changes without complication in this seems to have done extremely well. In fact this appears to be healed. With that being said his toe ulcer still continues to get in trouble fortunately not as much as has been in the past and there have been some signs of improvement and better granulation although I still think he's getting a lot of callous to the area which seems to indicate a  lot of pressure and friction. I believe he may benefit from a total contact cast especially now that the left shin ulcer has healed. 07/02/18 on evaluation today patient appears to be doing rather well in regard to his left anterior shin ulcer which is actually completely healed at this point. With that being said he has left great toe ulcer actually appears to be doing better he states over the weekend he actually had a significant amount of drainage from the toe after it had been hurting for some time. He states when his wife change the dressing there was a lot of discharge and fluid he actually took a picture he showed me today. Since that time he has not noted as much tenderness he feels like something may have "popped out" 07/09/18 on evaluation today patient appears to be doing about the same in regard to his toe ulcer.  He really has not shown any signs of significant improvement this seems to just be maintaining at best. He continues to be extremely active in fact I think this may be the biggest issue that he continues to do things as normal in fact right now he's remodeling the bathroom. Obviously I think he is a very active individual and as previously discussed I think a total contact cast would be of great benefit for him. This was discussed with patient yet again today I think we are gonna proceed in this regard. 07/12/18 patient presents today for his first total contact cast change. He has had this on for three days and states that though he felt like it may be rubbing a little bit around his ankles there does not appear to be any injury at this point. Overall the wound actually looks better in my pinion. 07/16/18 on evaluation today patient's wound actually appears to be doing much better at this point. He has been tolerating the dressing changes without complication. With that being said I do believe the total contact cast has been of benefit he states he is having much less discomfort that he previously had. Overall I'm very pleased with how things have progressed in just one weeks worth of the total contact cast. 07/23/18 on evaluation today patient's toe ulcer on the left great toe actually appears to be showing signs of improvement which is good news. In general I'm very pleased with the progress that he has made in the cast over the past two weeks. Fortunately there does not appear to be any evidence of infection at this time. He also is not having as much maceration as was previously noted. The patient is happy in this regard that he notes that the boot and cast is not the most comfortable thing in the world. 07/30/18-He is here in follow-up evaluation for left great toe ulcer. He presents in a total contact cast, after removal he is noted to be macerated with new injury to the dorsum of his left foot;  he continues to work outside and this is most likely secondary to [name], Marcus T. ([id number]) excessive sweating versus wet from a shower. We will hold off on reapplying total contact cast, allowing the dorsal foot injury to resolve and allowing for complete drying as he will continue to work outside. He will have a nurse visit next Monday to evaluate dorsal foot partial thickness ulcer and moisture/maceration to foot and follow-up in 2 weeks 08/13/18 on evaluation today patient's toe ulcer actually appears to be doing about the same. Fortunately the dorsal foot ulcer has  healed and his foot is not appearing to be as macerated. He continues to be overly active in my pinion in regard to the remodeling of his home he states he was actually finishing up some plumbing yesterday. Nonetheless I think that right now being outside the majority the day in a total contact cast in roughly 90 to 100o weather has been counterproductive not helpful for them in regard to healing the wound. I explained to him that if we're gonna get this to heal he may need to drop back a bit and take it easy to allow this to heal. He states that's not something is gonna likely be able to do. 08/20/18 on evaluation today patient actually appears to be doing a little better in regard to his toe ulcer. He's been tolerating the dressing changes much better he did not tolerate the front offloading shoe however. He states it actually caused his left knee to hurt that something that is been told before he probably needs replacement nonetheless he wasn't able to where the shoe past Friday so really only for about three days or so. Nonetheless we have not tried a PEG assist offloading shoe. I think this may be something that we could try for him. In general I do feel like the wound size is slightly smaller in particular in regard to the area of undermining. 08/27/18 on evaluation today patient actually appears to be doing possibly a bit  better in regard to his left great toe ulcer. He's been tolerating the dressing changes without complication. With that being said the patient actually does show evidence of improvement although there is still an opening of the ulcer down to the bone. He is previously tested negative for osteomyelitis by way of MRI. There does not appear to be necrotic bone noted in the base of the wound. 09/03/18 on evaluation today patient actually appears to be doing slightly better in regard to the overall size of the wound although the depth is still just as significant. He does have bone noted in the base of the wound still unfortunately. He's not having as much discomfort which is good news. Again the bone does not appear to be necessarily necrotic. Overall I am happy with the appearance but nonetheless still not pleased with the fact that this has not gotten significantly better in the past week. He has been tolerating the dressing changes without complication. He still continues to remodel his house he states these get ready to finish the drywall and then paint. After that he states he may need to do some fishing. I think fishing can actually be beneficial for him I'm more concerned about the fact that if he does not back off as far as movement and getting around in general that is gonna end up with something more significant going on. Obviously he had no osteomyelitis previous but that could change in the future. Electronic Signature(s) Signed: 09/03/2018 1:59:57 PM By: Worthy Keeler PA-C Entered By: Worthy Keeler on 09/03/2018 10:00:08 [name], [name] ([id number]) -------------------------------------------------------------------------------- Physical Exam Details Patient Name: Marcus Loll T. Date of Service: 09/03/2018 8:30 AM Medical Record Number: [id number] Patient Account Number: [id number] Date of Birth/Sex: 1951-06-22 (67 y.o. M) Treating RN: Montey Hora Primary Care Provider: BESS,  Valetta Fuller Other Clinician: Referring Provider: BESS, KATY Treating Provider/Extender: STONE III, Blu Mcglaun Weeks in Treatment: 97 Constitutional Well-nourished and well-hydrated in no acute distress. Respiratory normal breathing without difficulty. Psychiatric this patient is able to make decisions and demonstrates good  insight into disease process. Alert and Oriented x 3. pleasant and cooperative. Notes My suggestion at this point was that we cleaned away some of the callous from the surrounding portion of the wound. There's also some Slough in the central portion of the wound. I did perform sharp debridement today to remove this and clean things away. He tolerated this without complication post debridement the wound bed appears to be doing better. Electronic Signature(s) Signed: 09/03/2018 1:59:57 PM By: Worthy Keeler PA-C Entered By: Worthy Keeler on 09/03/2018 10:00:39 [name], [name] ([id number]) -------------------------------------------------------------------------------- Physician Orders Details Patient Name: [name]. Date of Service: 09/03/2018 8:30 AM Medical Record Number: [id number] Patient Account Number: [id number] Date of Birth/Sex: May 05, 1951 (67 y.o. M) Treating RN: Montey Hora Primary Care Provider: BESS, KATY Other Clinician: Referring Provider: BESS, KATY Treating Provider/Extender: Melburn Hake, Javonn Gauger Weeks in Treatment: 19 Verbal / Phone Orders: No Diagnosis Coding ICD-10 Coding Code Description L97.522 Non-pressure chronic ulcer of other part of left foot with fat layer exposed L97.521 Non-pressure chronic ulcer of other part of left foot limited to breakdown of skin I87.2 Venous insufficiency (chronic) (peripheral) E11.621 Type 2 diabetes mellitus with foot ulcer I63.9 Cerebral infarction, unspecified I10 Essential (primary) hypertension Wound Cleansing Wound #2 Left Toe Great o Clean wound with Normal Saline. o Cleanse wound with mild soap and  water Anesthetic (add to Medication List) Wound #2 Left Toe Great o Topical Lidocaine 4% cream applied to wound bed prior to debridement (In Clinic Only). Primary Wound Dressing Wound #2 Left Toe Great o Silver Alginate - over silver collagen o Silver Collagen - in wound Secondary Dressing Wound #2 Left Toe Great o Gauze and Kerlix/Conform - secure with coban o Foam Dressing Change Frequency Wound #2 Left Toe Great o Change dressing every other day. Follow-up Appointments Wound #2 Left Toe Great o Return Appointment in 1 week. Off-Loading Wound #2 Left Toe Great o Open toe surgical shoe with peg assist. o Other: - keep pressure off of area [name], [name] ([id number]) Additional Orders / Instructions Wound #2 Left Toe Great o Stop Smoking o Vitamin A; Vitamin C, Zinc - Please add a multivitamin with 100% of these o Increase protein intake. o Activity as tolerated Electronic Signature(s) Signed: 09/03/2018 1:59:57 PM By: Worthy Keeler PA-C Signed: 09/03/2018 4:54:17 PM By: Montey Hora Entered By: Montey Hora on 09/03/2018 09:06:01 [name], [name] ([id number]) -------------------------------------------------------------------------------- Problem List Details Patient Name: Marcus Loll T. Date of Service: 09/03/2018 8:30 AM Medical Record Number: [id number] Patient Account Number: [id number] Date of Birth/Sex: 26-Apr-1951 (67 y.o. M) Treating RN: Montey Hora Primary Care Provider: BESS, Valetta Fuller Other Clinician: Referring Provider: BESS, KATY Treating Provider/Extender: Melburn Hake, Leilah Polimeni Weeks in Treatment: 19 Active Problems ICD-10 Evaluated Encounter Code Description Active Date Today Diagnosis L97.526 Non-pressure chronic ulcer of other part of left foot with bone 04/19/2018 No Yes involvement without evidence of necrosis I87.2 Venous insufficiency (chronic) (peripheral) 04/19/2018 No Yes E11.621 Type 2 diabetes mellitus with foot  ulcer 04/19/2018 No Yes I63.9 Cerebral infarction, unspecified 04/19/2018 No Yes I10 Essential (primary) hypertension 04/19/2018 No Yes Inactive Problems Resolved Problems ICD-10 Code Description Active Date Resolved Date S81.812A Laceration without foreign body, left lower leg, initial encounter 04/19/2018 04/19/2018 E08.144 Non-pressure chronic ulcer of other part of left lower leg with fat 04/19/2018 04/19/2018 layer exposed Electronic Signature(s) Signed: 09/03/2018 1:59:57 PM By: Worthy Keeler PA-C Entered By: Worthy Keeler on 09/03/2018 10:02:15 [name], [name] ([id number]) -------------------------------------------------------------------------------- Progress Note Details Patient  Name: [name], MACDONNELL. Date of Service: 09/03/2018 8:30 AM Medical Record Number: [id number] Patient Account Number: [id number] Date of Birth/Sex: 1951-01-12 (67 y.o. M) Treating RN: Montey Hora Primary Care Provider: BESS, KATY Other Clinician: Referring Provider: BESS, KATY Treating Provider/Extender: Melburn Hake, Jashira Cotugno Weeks in Treatment: 19 Subjective Chief Complaint Information obtained from Patient Left lower leg and left great toe ulcer History of Present Illness (HPI) The following HPI elements were documented for the patient's wound: Associated Signs and Symptoms: Patient has a history of chronic venous insufficiency, diabetes mellitus type II, its reform fraction, hypertension 04/19/18 on evaluation today patient presents initially concerning an ulcer of his left great toe and left anterior shin. He states that the shin was definitely a trauma injury where he struck this on a metal pole injuring leg. In regard to the distal left great toe he's really not sure what happened here although he does have a significant callous noted as well. He does have a pacemaker. Fortunately his ABI was 0.98 on the right and 0.83 on the left this appears to be doing fairly well. Overall I'm pleased with that.  Nonetheless he states that this really has not seem to be improving. He has been tolerating the dressing changes without complication. The left anterior leg ulcer began on 04/05/18 according to what he tells [place] today although one note I did have for review said it was April 5. I'm inclined to believe it was the fifth she did have an office visit on 04/01/18 with his family nurse practitioner Faith Rogue. Nonetheless either way it's been going on this month for several weeks. The left great toe has actually been present since January 1 he does not know how this occurred. Fortunately has no pain at the site although he does have pain on the left anterior shin. His hemoglobin A1c is 8.0 in March 2019. 04/26/18 on evaluation today patient presents for follow-up concerning his life into your lower [place] also as well as his left great toe also. Fortunately the shin actually appears to be doing better on evaluation today in my opinion I'm not seeing as much in the way of fluffernutter on the surface although it does still require debridement today this is improved. Size wise it's really not much different. With that being said the total ulcer actually appears to be a little bit more macerated today I'm not sure why they state that several days ago when this was changed that was not the case. Fortunately is not having severe pain although both areas do hurt the shin is worse. 05/03/18 on evaluation today patient appears to be doing better in regard to his left first toe and left shin ulcers. He is been tolerating the dressing changes without complication and the good news is this seems to be showing signs of getting better. Overall I'm very pleased with the progress that has been made over the past week. Patient is still having pain especially in regard to the left shin. 05/10/18 on evaluation today patient appears to be doing rather well in regard to his two ulcers. The toe ulcer appears to be smaller he does  have some undermining however in this had to be cleaned out just a little bit. With that being said overall I feel like this is showing signs of improvement. The left anterior shin ulcer also showing signs of improvement at this time. There does not appear to be any evidence of infection which is good news. 05/21/18 on evaluation today patient  appears to be doing very well in regard to his left shin ulcer. He has been tolerating the dressing changes without complication. With that being said he is having issues at this point with his great toe on the left. We have not x-rayed that at this point I think we may need to. Nonetheless he has pain really at the roughly 5 o'clock location but nowhere else and I really cannot explain this I do not see any obvious form body. Nonetheless he continues to have issues with getting this area to close it's not progressing as nicely. 05/28/18 on evaluation today patient appears to be doing very well in regard to his left lower extremity anterior ulcer. He has been tolerating the dressing changes without complication. Overall this is making excellent progress with the collagen dressing. His left great toe x-ray did return and showed that he had no if you that normality. He did have a plantar heel spur Dolloff, Makell T. ([id number]) but this is more degenerative. Otherwise just arthritis was noted no osteomyelitis and no obvious form body was visualized. Overall things seem to be going fairly well. With that being said he still has pain when looking at the toe from the plantar aspect at roughly the six back to the 4 o'clock location which is not quite as bad as last time but still hurts more than any other part of his wound. It does appear the Iodoflex has been of benefit however. 06/04/18 on evaluation today patient appears to be doing excellent in regard to his left anterior shin ulcer. This has made excellent progress and is showing signs of improving in fact very  close to completely closing in healing. Nonetheless unfortunately his left great toe has not faired quite as well as far as progress is concerned. He notes that after last week's debridement he actually seem to be doing a little bit better for a couple of days until Thursday of last week when he had increases in discomfort as well is changes in the draining. He states that the drainage has been more green in color according to his wife and what he has seen. He's also had more discomfort. He relates to me again that he really feels like this all started when he stepped on a nail although he does not exactly remember it it seemed to be a puncture wound that he feels may be the culprit for why all this started. Nonetheless I am getting more concerned about the possibility of osteomyelitis despite the fact that the x-ray was negative I was hopeful in this interim since last week to this week that we would see some improvement in the overall appearance of the toe. Unfortunately if anything I feel like it's a little worse. It was macerated but I think we can attribute this to the fact that the patient did have an episode last night we got stuck in the rain and he thinks the dressing got wet. He did not change it I told him in the future if this were to happen to please go ahead and change the dressing it won't hurt to change it more frequently in that situation. Otherwise other than the maceration I do not see any evidence of anything being any worse but it also does not appear to be any better. 06/11/18 on evaluation today patient's wounds both the great toe as well as the left shin both on the left side show signs of good improvement. Fortunately the patient's culture came back negative  in regard to the toe and there does not appear to be any evidence of infection. His MRI is actually scheduled for 06/19/18. Fortunately this is right before I see him next week and then we can go to the results of the  culture at that point. Patient is in agreement with that plan. With that being said otherwise he has been showing signs of having less pain in regard to the toe which is also good news my hope is that he does not have osteomyelitis. 06/18/18 on evaluation today patient appears to be doing very well in regard to his left lower extremity ulcer. He also states at this point in time that he has been tolerating the dressing changes with the toe it appears also to be doing very well at this point. In general I'm happy with the progress that he seems to be making. With that being said he still continues to have pain in regard to the great toe ulcer fortunately this does not seem to be as significant as it was in the beginning but still nonetheless he is having some pain. No fevers, chills, nausea, or vomiting noted at this time. 06/25/18 on evaluation today patient actually appears to be doing very well in regard to his left anterior shin ulcer. He's been tolerating the dressing changes without complication in this seems to have done extremely well. In fact this appears to be healed. With that being said his toe ulcer still continues to get in trouble fortunately not as much as has been in the past and there have been some signs of improvement and better granulation although I still think he's getting a lot of callous to the area which seems to indicate a lot of pressure and friction. I believe he may benefit from a total contact cast especially now that the left shin ulcer has healed. 07/02/18 on evaluation today patient appears to be doing rather well in regard to his left anterior shin ulcer which is actually completely healed at this point. With that being said he has left great toe ulcer actually appears to be doing better he states over the weekend he actually had a significant amount of drainage from the toe after it had been hurting for some time. He states when his wife change the dressing there was a  lot of discharge and fluid he actually took a picture he showed me today. Since that time he has not noted as much tenderness he feels like something may have "popped out" 07/09/18 on evaluation today patient appears to be doing about the same in regard to his toe ulcer. He really has not shown any signs of significant improvement this seems to just be maintaining at best. He continues to be extremely active in fact I think this may be the biggest issue that he continues to do things as normal in fact right now he's remodeling the bathroom. Obviously I think he is a very active individual and as previously discussed I think a total contact cast would be of great benefit for him. This was discussed with patient yet again today I think we are gonna proceed in this regard. 07/12/18 patient presents today for his first total contact cast change. He has had this on for three days and states that though he felt like it may be rubbing a little bit around his ankles there does not appear to be any injury at this point. Overall the wound actually looks better in my pinion. 07/16/18 on evaluation  today patient's wound actually appears to be doing much better at this point. He has been tolerating the dressing changes without complication. With that being said I do believe the total contact cast has been of benefit he states he is having much less discomfort that he previously had. Overall I'm very pleased with how things have progressed in just one weeks worth of the total contact cast. [name], [name] ([id number]) 07/23/18 on evaluation today patient's toe ulcer on the left great toe actually appears to be showing signs of improvement which is good news. In general I'm very pleased with the progress that he has made in the cast over the past two weeks. Fortunately there does not appear to be any evidence of infection at this time. He also is not having as much maceration as was previously noted. The patient is  happy in this regard that he notes that the boot and cast is not the most comfortable thing in the world. 07/30/18-He is here in follow-up evaluation for left great toe ulcer. He presents in a total contact cast, after removal he is noted to be macerated with new injury to the dorsum of his left foot; he continues to work outside and this is most likely secondary to excessive sweating versus wet from a shower. We will hold off on reapplying total contact cast, allowing the dorsal foot injury to resolve and allowing for complete drying as he will continue to work outside. He will have a nurse visit next Monday to evaluate dorsal foot partial thickness ulcer and moisture/maceration to foot and follow-up in 2 weeks 08/13/18 on evaluation today patient's toe ulcer actually appears to be doing about the same. Fortunately the dorsal foot ulcer has healed and his foot is not appearing to be as macerated. He continues to be overly active in my pinion in regard to the remodeling of his home he states he was actually finishing up some plumbing yesterday. Nonetheless I think that right now being outside the majority the day in a total contact cast in roughly 90 to 100 weather has been counterproductive not helpful for them in regard to healing the wound. I explained to him that if we're gonna get this to heal he may need to drop back a bit and take it easy to allow this to heal. He states that's not something is gonna likely be able to do. 08/20/18 on evaluation today patient actually appears to be doing a little better in regard to his toe ulcer. He's been tolerating the dressing changes much better he did not tolerate the front offloading shoe however. He states it actually caused his left knee to hurt that something that is been told before he probably needs replacement nonetheless he wasn't able to where the shoe past Friday so really only for about three days or so. Nonetheless we have not tried a PEG assist  offloading shoe. I think this may be something that we could try for him. In general I do feel like the wound size is slightly smaller in particular in regard to the area of undermining. 08/27/18 on evaluation today patient actually appears to be doing possibly a bit better in regard to his left great toe ulcer. He's been tolerating the dressing changes without complication. With that being said the patient actually does show evidence of improvement although there is still an opening of the ulcer down to the bone. He is previously tested negative for osteomyelitis by way of MRI. There does not appear  to be necrotic bone noted in the base of the wound. 09/03/18 on evaluation today patient actually appears to be doing slightly better in regard to the overall size of the wound although the depth is still just as significant. He does have bone noted in the base of the wound still unfortunately. He's not having as much discomfort which is good news. Again the bone does not appear to be necessarily necrotic. Overall I am happy with the appearance but nonetheless still not pleased with the fact that this has not gotten significantly better in the past week. He has been tolerating the dressing changes without complication. He still continues to remodel his house he states these get ready to finish the drywall and then paint. After that he states he may need to do some fishing. I think fishing can actually be beneficial for him I'm more concerned about the fact that if he does not back off as far as movement and getting around in general that is gonna end up with something more significant going on. Obviously he had no osteomyelitis previous but that could change in the future. Patient History Information obtained from Patient. Family History Cancer - Father, Diabetes - Father, Heart Disease - Mother,Father, Hypertension - Mother, Kidney Disease - Child, No family history of Lung Disease, Seizures, Stroke,  Thyroid Problems, Tuberculosis. Social History Current every day smoker - 1 - 1/2 pack daily, Marital Status - Married, Alcohol Use - Rarely, Drug Use - No History, Caffeine Use - Daily - coffee. Review of Systems (ROS) Constitutional Symptoms (General Health) Denies complaints or symptoms of Fever, Chills. Respiratory The patient has no complaints or symptoms. Cardiovascular The patient has no complaints or symptoms. JABAREE, MERCADO ([id number]) Psychiatric The patient has no complaints or symptoms. Objective Constitutional Well-nourished and well-hydrated in no acute distress. Vitals Time Taken: 8:31 AM, Height: 69 in, Weight: 238 lbs, BMI: 35.1, Temperature: 97.8 F, Pulse: 80 bpm, Respiratory Rate: 16 breaths/min, Blood Pressure: 121/62 mmHg. Respiratory normal breathing without difficulty. Psychiatric this patient is able to make decisions and demonstrates good insight into disease process. Alert and Oriented x 3. pleasant and cooperative. General Notes: My suggestion at this point was that we cleaned away some of the callous from the surrounding portion of the wound. There's also some Slough in the central portion of the wound. I did perform sharp debridement today to remove this and clean things away. He tolerated this without complication post debridement the wound bed appears to be doing better. Integumentary (Hair, Skin) Wound #2 status is Open. Original cause of wound was Trauma. The wound is located on the Left Toe Great. The wound measures 0.2cm length x 0.2cm width x 0.2cm depth; 0.031cm^2 area and 0.006cm^3 volume. There is Fat Layer (Subcutaneous Tissue) Exposed exposed. There is no tunneling noted, however, there is undermining starting at 12:00 and ending at 12:00 with a maximum distance of 0.2cm. There is a small amount of serous drainage noted. The wound margin is flat and intact. There is medium (34-66%) pink granulation within the wound bed. There is a medium  (34-66%) amount of necrotic tissue within the wound bed including Adherent Slough. The periwound skin appearance exhibited: Callus. The periwound skin appearance did not exhibit: Crepitus, Excoriation, Induration, Rash, Scarring, Dry/Scaly, Maceration, Atrophie Blanche, Cyanosis, Ecchymosis, Hemosiderin Staining, Mottled, Pallor, Rubor, Erythema. Periwound temperature was noted as No Abnormality. The periwound has tenderness on palpation. Assessment Active Problems ICD-10 Non-pressure chronic ulcer of other part of left foot with bone  involvement without evidence of necrosis Venous insufficiency (chronic) (peripheral) Type 2 diabetes mellitus with foot ulcer Cerebral infarction, unspecified Essential (primary) hypertension [name], [name] ([id number]) Procedures Wound #2 Pre-procedure diagnosis of Wound #2 is a Diabetic Wound/Ulcer of the Lower Extremity located on the Left Toe Great .Severity of Tissue Pre Debridement is: Fat layer exposed. There was a Excisional Skin/Subcutaneous Tissue Debridement with a total area of 0.04 sq cm performed by STONE III, Akire Rennert E., PA-C. With the following instrument(s): Curette to remove Viable and Non-Viable tissue/material. Material removed includes Callus, Subcutaneous Tissue, and Slough after achieving pain control using Lidocaine 4% Topical Solution. No specimens were taken. A time out was conducted at 09:01, prior to the start of the procedure. A Minimum amount of bleeding was controlled with Pressure. The procedure was tolerated well with a pain level of 0 throughout and a pain level of 0 following the procedure. Patient s Level of Consciousness post procedure was recorded as Awake and Alert. Post Debridement Measurements: 0.2cm length x 0.2cm width x 0.6cm depth; 0.019cm^3 volume. Character of Wound/Ulcer Post Debridement is improved. Severity of Tissue Post Debridement is: Fat layer exposed. Post procedure Diagnosis Wound #2: Same as  Pre-Procedure Plan Wound Cleansing: Wound #2 Left Toe Great: Clean wound with Normal Saline. Cleanse wound with mild soap and water Anesthetic (add to Medication List): Wound #2 Left Toe Great: Topical Lidocaine 4% cream applied to wound bed prior to debridement (In Clinic Only). Primary Wound Dressing: Wound #2 Left Toe Great: Silver Alginate - over silver collagen Silver Collagen - in wound Secondary Dressing: Wound #2 Left Toe Great: Gauze and Kerlix/Conform - secure with coban Foam Dressing Change Frequency: Wound #2 Left Toe Great: Change dressing every other day. Follow-up Appointments: Wound #2 Left Toe Great: Return Appointment in 1 week. Off-Loading: Wound #2 Left Toe Great: Open toe surgical shoe with peg assist. Other: - keep pressure off of area Additional Orders / Instructions: Wound #2 Left Toe Great: Stop Smoking Vitamin A; Vitamin C, Zinc - Please add a multivitamin with 100% of these Increase protein intake. Activity as tolerated Cloke, Troy T. ([id number]) Yetta Flock suggest currently that we continue with the above wound care measures for the next week. The patient is in agreement with the plan. Subsequently will see were things stand if anything changes or worsens. Please see above for specific wound care orders. We will see patient for re-evaluation in 1 week(s) here in the clinic. If anything worsens or changes patient will contact our office for additional recommendations. 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 He understands but states that he has to do this nonetheless. Electronic Signature(s) Signed: 09/03/2018 1:59:57 PM By: Worthy Keeler PA-C Entered By: Worthy Keeler on 09/03/2018 10:02:28 [name]  ([id number]) -------------------------------------------------------------------------------- ROS/PFSH Details Patient Name: [name]. Date of Service: 09/03/2018 8:30 AM Medical Record Number: [id number] Patient Account Number: [id number] Date of Birth/Sex: 1951-10-14 (67 y.o. M) Treating RN: Montey Hora Primary Care Provider: BESS, Valetta Fuller Other Clinician: Referring Provider: BESS, KATY Treating Provider/Extender: STONE III, Verlon Carcione Weeks in Treatment: 19 Information Obtained From Patient Wound History Do you currently have one or more open woundso Yes How many open wounds do you currently haveo 2 Approximately how long have you had your woundso 2 weeks How have you been  treating your wound(s) until nowo yes Doctor Has your wound(s) ever healed and then re-openedo No Have you had any lab work done in the past montho No Have you tested positive for an antibiotic resistant organism (MRSA, VRE)o No Have you tested positive for osteomyelitis (bone infection)o No Have you had any tests for circulation on your legso No Have you had other problems associated with your woundso Infection, Swelling Constitutional Symptoms (General Health) Complaints and Symptoms: Negative for: Fever; Chills Eyes Medical History: Negative for: Cataracts; Glaucoma; Optic Neuritis Ear/Nose/Mouth/Throat Medical History: Negative for: Chronic sinus problems/congestion Hematologic/Lymphatic Medical History: Positive for: Lymphedema Negative for: Anemia; Hemophilia; Human Immunodeficiency Virus; Sickle Cell Disease Respiratory Complaints and Symptoms: No Complaints or Symptoms Medical History: Negative for: Aspiration; Asthma; Chronic Obstructive Pulmonary Disease (COPD); Pneumothorax; Sleep Apnea; Tuberculosis Cardiovascular Complaints and Symptoms: No Complaints or Symptoms Medical History: ADARSH, MUNDORF ([id number]) Positive for: Arrhythmia - pacemaker; Coronary Artery Disease; Hypertension;  Myocardial Infarction Negative for: Deep Vein Thrombosis; Hypotension; Peripheral Arterial Disease; Peripheral Venous Disease; Phlebitis; Vasculitis Gastrointestinal Medical History: Negative for: Cirrhosis ; Colitis; Crohnos; Hepatitis A; Hepatitis B; Hepatitis C Endocrine Medical History: Positive for: Type II Diabetes Negative for: Type I Diabetes Time with diabetes: 5 years Treated with: Oral agents Blood sugar tested every day: Yes Tested : daily AM Blood sugar testing results: Breakfast: 174 Genitourinary Medical History: Negative for: End Stage Renal Disease Immunological Medical History: Negative for: Lupus Erythematosus; Raynaudos; Scleroderma Integumentary (Skin) Medical History: Negative for: History of Burn; History of pressure wounds Musculoskeletal Medical History: Negative for: Gout; Rheumatoid Arthritis; Osteoarthritis; Osteomyelitis Neurologic Medical History: Positive for: Neuropathy Negative for: Dementia; Quadriplegia; Paraplegia; Seizure Disorder Oncologic Medical History: Positive for: Received Chemotherapy; Received Radiation - Seeds prostate Psychiatric Complaints and Symptoms: No Complaints or Symptoms Medical History: Negative for: Anorexia/bulimia; Confinement Anxiety Frerichs, HANCE CASPERS ([id number]) Immunizations Pneumococcal Vaccine: Received Pneumococcal Vaccination: Yes Tetanus Vaccine: Last tetanus shot: 12/25/2017 Implantable Devices Family and Social History Cancer: Yes - Father; Diabetes: Yes - Father; Heart Disease: Yes - Mother,Father; Hypertension: Yes - Mother; Kidney Disease: Yes - Child; Lung Disease: No; Seizures: No; Stroke: No; Thyroid Problems: No; Tuberculosis: No; Current every day smoker - 1 - 1/2 pack daily; Marital Status - Married; Alcohol Use: Rarely; Drug Use: No History; Caffeine Use: Daily - coffee; Financial Concerns: No; Food, Clothing or Shelter Needs: No; Support System Lacking: No; Transportation Concerns:  No Physician Affirmation I have reviewed and agree with the above information. Electronic Signature(s) Signed: 09/03/2018 1:59:57 PM By: Worthy Keeler PA-C Signed: 09/03/2018 4:54:17 PM By: Montey Hora Entered By: Worthy Keeler on 09/03/2018 10:00:24 Hiemstra, [name] ([id number]) -------------------------------------------------------------------------------- SuperBill Details Patient Name: [name]. Date of Service: 09/03/2018 Medical Record Number: [id number] Patient Account Number: [id number] Date of Birth/Sex: 12/31/1950 (67 y.o. M) Treating RN: Montey Hora Primary Care Provider: BESS, KATY Other Clinician: Referring Provider: BESS, KATY Treating Provider/Extender: STONE III, Dyani Babel Weeks in Treatment: 19 Diagnosis Coding ICD-10 Codes Code Description L97.526 Non-pressure chronic ulcer of other part of left foot with bone involvement without evidence of necrosis I87.2 Venous insufficiency (chronic) (peripheral) E11.621 Type 2 diabetes mellitus with foot ulcer I63.9 Cerebral infarction, unspecified I10 Essential (primary) hypertension Facility Procedures CPT4: Description Modifier Quantity Code [id number] 11042 - DEB SUBQ TISSUE 20 SQ CM/< 1 ICD-10 Diagnosis Description L97.526 Non-pressure chronic ulcer of other part of left foot with bone involvement without evidence of necrosis Physician Procedures CPT4: Description Modifier Quantity Code [id number] - WC PHYS SUBQ TISS 20  SQ CM 1 ICD-10 Diagnosis Description L97.526 Non-pressure chronic ulcer of other part of left foot with bone involvement without evidence of necrosis Electronic Signature(s) Signed: 09/03/2018 1:59:57 PM By: Worthy Keeler PA-C Entered By: Worthy Keeler on 09/03/2018 10:02:35

## 2018-09-06 ENCOUNTER — Other Ambulatory Visit
Admission: RE | Admit: 2018-09-06 | Discharge: 2018-09-06 | Disposition: A | Discharge status: Home / Self Care | Payer: PPO | Source: Ambulatory Visit | Attending: Physician Assistant | Admitting: Physician Assistant

## 2018-09-06 ENCOUNTER — Encounter: Payer: PPO | Admitting: Physician Assistant

## 2018-09-06 DIAGNOSIS — S91102A Unspecified open wound of left great toe without damage to nail, initial encounter: Secondary | ICD-10-CM | POA: Insufficient documentation

## 2018-09-06 DIAGNOSIS — L97522 Non-pressure chronic ulcer of other part of left foot with fat layer exposed: Secondary | ICD-10-CM | POA: Diagnosis not present

## 2018-09-06 DIAGNOSIS — X58XXXA Exposure to other specified factors, initial encounter: Secondary | ICD-10-CM | POA: Diagnosis not present

## 2018-09-06 DIAGNOSIS — E11621 Type 2 diabetes mellitus with foot ulcer: Secondary | ICD-10-CM | POA: Diagnosis not present

## 2018-09-08 LAB — AEROBIC CULTURE  (SUPERFICIAL SPECIMEN)

## 2018-09-08 LAB — AEROBIC CULTURE W GRAM STAIN (SUPERFICIAL SPECIMEN)

## 2018-09-10 ENCOUNTER — Ambulatory Visit: Payer: PPO | Admitting: Physician Assistant

## 2018-09-11 NOTE — Progress Notes (Signed)
Marcus, [name] ([id number]) Visit Report for 09/06/2018 Arrival Information Details Patient Name: [name], [name]. Date of Service: 09/06/2018 10:15 AM Medical Record Number: [id number] Patient Account Number: [id number] Date of Birth/Sex: 1951-11-24 (67 y.o. M) Treating RN: Montey Hora Primary Care Victorian Gunn: BESS, KATY Other Clinician: Referring Neta Upadhyay: BESS, KATY Treating Cielle Aguila/Extender: STONE III, HOYT Weeks in Treatment: 20 Visit Information History Since Last Visit Added or deleted any medications: No Patient Arrived: Ambulatory Any new allergies or adverse reactions: No Arrival Time: 10:08 Had a fall or experienced change in No Accompanied By: self activities of daily living that may affect Transfer Assistance: None risk of falls: Patient Identification Verified: Yes Signs or symptoms of abuse/neglect since last visito No Secondary Verification Process Completed: Yes Hospitalized since last visit: No Patient Requires Transmission-Based No Has Dressing in Place as Prescribed: Yes Precautions: Pain Present Now: Yes Patient Has Alerts: Yes Patient Alerts: Type II Diabetic Electronic Signature(s) Signed: 09/06/2018 10:27:44 AM By: Lorine Bears RCP, RRT, CHT Entered By: Becky Sax, Amado Nash on 09/06/2018 10:09:19 Quinney, Huntley Dec ([id number]) -------------------------------------------------------------------------------- Clinic Level of Care Assessment Details Patient Name: Marcus Loll T. Date of Service: 09/06/2018 10:15 AM Medical Record Number: [id number] Patient Account Number: [id number] Date of Birth/Sex: 08/05/1951 (67 y.o. M) Treating RN: Montey Hora Primary Care Grantley Savage: BESS, KATY Other Clinician: Referring Shamere Dilworth: BESS, KATY Treating Yosgar Demirjian/Extender: STONE III, HOYT Weeks in Treatment: 20 Clinic Level of Care Assessment Items TOOL 4 Quantity Score []  - Use when only an EandM is performed on FOLLOW-UP visit  0 ASSESSMENTS - Nursing Assessment / Reassessment X - Reassessment of Co-morbidities (includes updates in patient status) 1 10 X- 1 5 Reassessment of Adherence to Treatment Plan ASSESSMENTS - Wound and Skin Assessment / Reassessment X - Simple Wound Assessment / Reassessment - one wound 1 5 []  - 0 Complex Wound Assessment / Reassessment - multiple wounds []  - 0 Dermatologic / Skin Assessment (not related to wound area) ASSESSMENTS - Focused Assessment []  - Circumferential Edema Measurements - multi extremities 0 []  - 0 Nutritional Assessment / Counseling / Intervention X- 1 5 Lower Extremity Assessment (monofilament, tuning fork, pulses) []  - 0 Peripheral Arterial Disease Assessment (using hand held doppler) ASSESSMENTS - Ostomy and/or Continence Assessment and Care []  - Incontinence Assessment and Management 0 []  - 0 Ostomy Care Assessment and Management (repouching, etc.) PROCESS - Coordination of Care X - Simple Patient / Family Education for ongoing care 1 15 []  - 0 Complex (extensive) Patient / Family Education for ongoing care X- 1 10 Staff obtains Programmer, systems, Records, Test Results / Process Orders []  - 0 Staff telephones HHA, Nursing Homes / Clarify orders / etc []  - 0 Routine Transfer to another Facility (non-emergent condition) []  - 0 Routine Hospital Admission (non-emergent condition) []  - 0 New Admissions / Biomedical engineer / Ordering NPWT, Apligraf, etc. []  - 0 Emergency Hospital Admission (emergent condition) X- 1 10 Simple Discharge Coordination DAYMIAN, LILL ([id number]) []  - 0 Complex (extensive) Discharge Coordination PROCESS - Special Needs []  - Pediatric / Minor Patient Management 0 []  - 0 Isolation Patient Management []  - 0 Hearing / Language / Visual special needs []  - 0 Assessment of Community assistance (transportation, D/C planning, etc.) []  - 0 Additional assistance / Altered mentation []  - 0 Support Surface(s) Assessment (bed,  cushion, seat, etc.) INTERVENTIONS - Wound Cleansing / Measurement X - Simple Wound Cleansing - one wound 1 5 []  - 0 Complex Wound Cleansing - multiple wounds X- 1 5 Wound Imaging (  photographs - any number of wounds) []  - 0 Wound Tracing (instead of photographs) X- 1 5 Simple Wound Measurement - one wound []  - 0 Complex Wound Measurement - multiple wounds INTERVENTIONS - Wound Dressings X - Small Wound Dressing one or multiple wounds 1 10 []  - 0 Medium Wound Dressing one or multiple wounds []  - 0 Large Wound Dressing one or multiple wounds []  - 0 Application of Medications - topical []  - 0 Application of Medications - injection INTERVENTIONS - Miscellaneous []  - External ear exam 0 X- 1 5 Specimen Collection (cultures, biopsies, blood, body fluids, etc.) []  - 0 Specimen(s) / Culture(s) sent or taken to Lab for analysis []  - 0 Patient Transfer (multiple staff / Civil Service fast streamer / Similar devices) []  - 0 Simple Staple / Suture removal (25 or less) []  - 0 Complex Staple / Suture removal (26 or more) []  - 0 Hypo / Hyperglycemic Management (close monitor of Blood Glucose) []  - 0 Ankle / Brachial Index (ABI) - do not check if billed separately X- 1 5 Vital Signs Rostro, DANZELL [name]. ([id number]) Has the patient been seen at the hospital within the last three years: Yes Total Score: 95 Level Of Care: New/Established - Level 3 Electronic Signature(s) Signed: 09/09/2018 4:11:20 PM By: Montey Hora Entered By: Montey Hora on 09/06/2018 11:19:42 Pichette, Huntley Dec ([id number]) -------------------------------------------------------------------------------- Encounter Discharge Information Details Patient Name: Marcus Loll T. Date of Service: 09/06/2018 10:15 AM Medical Record Number: [id number] Patient Account Number: [id number] Date of Birth/Sex: Oct 22, 1951 (67 y.o. M) Treating RN: Montey Hora Primary Care Demaris Bousquet: BESS, Valetta Fuller Other Clinician: Referring Sashay Felling: BESS,  KATY Treating Lejon Afzal/Extender: Melburn Hake, HOYT Weeks in Treatment: 20 Encounter Discharge Information Items Discharge Condition: Stable Ambulatory Status: Ambulatory Discharge Destination: Home Transportation: Private Auto Accompanied By: self Schedule Follow-up Appointment: Yes Clinical Summary of Care: Electronic Signature(s) Signed: 09/09/2018 4:11:20 PM By: Montey Hora Entered By: Montey Hora on 09/06/2018 11:20:57 Daher, Huntley Dec ([id number]) -------------------------------------------------------------------------------- Lower Extremity Assessment Details Patient Name: Marcus Loll T. Date of Service: 09/06/2018 10:15 AM Medical Record Number: [id number] Patient Account Number: [id number] Date of Birth/Sex: 01-29-1951 (67 y.o. M) Treating RN: Secundino Ginger Primary Care Corderro Koloski: BESS, KATY Other Clinician: Referring Francely Craw: BESS, KATY Treating Kendria Halberg/Extender: STONE III, HOYT Weeks in Treatment: 20 Edema Assessment Assessed: [Left: No] [Right: No] Edema: [Left: N] [Right: o] Calf Left: Right: Point of Measurement: 36 cm From Medial Instep 37 cm cm Ankle Left: Right: Point of Measurement: 11 cm From Medial Instep 24 cm cm Vascular Assessment Claudication: Claudication Assessment [Left:None] Pulses: Dorsalis Pedis Palpable: [Left:Yes] Posterior Tibial Extremity colors, hair growth, and conditions: Extremity Color: [Left:Hyperpigmented] Hair Growth on Extremity: [Left:No] Temperature of Extremity: [Left:Cool] Capillary Refill: [Left:< 3 seconds] Toe Nail Assessment Left: Right: Thick: Yes Discolored: No Deformed: No Improper Length and Hygiene: No Electronic Signature(s) Signed: 09/06/2018 3:36:43 PM By: Secundino Ginger Entered By: Secundino Ginger on 09/06/2018 10:27:08 [name] ([id number]) -------------------------------------------------------------------------------- Multi Wound Chart Details Patient Name: Marcus Loll T. Date of Service:  09/06/2018 10:15 AM Medical Record Number: [id number] Patient Account Number: [id number] Date of Birth/Sex: January 04, 1951 (67 y.o. M) Treating RN: Montey Hora Primary Care Loralee Weitzman: BESS, KATY Other Clinician: Referring Kashif Pooler: BESS, KATY Treating Fount Bahe/Extender: STONE III, HOYT Weeks in Treatment: 20 Vital Signs Height(in): 69 Pulse(bpm): 81 Weight(lbs): 238 Blood Pressure(mmHg): 101/63 Body Mass Index(BMI): 35 Temperature(F): 98.0 Respiratory Rate 16 (breaths/min): Photos: [N/A:N/A] Wound Location: Left Toe Great N/A N/A Wounding Event: Trauma N/A N/A Primary Etiology: Diabetic Wound/Ulcer of the N/A N/A  Lower Extremity Secondary Etiology: Trauma, Other N/A N/A Comorbid History: Lymphedema, Arrhythmia, N/A N/A Coronary Artery Disease, Hypertension, Myocardial Infarction, Type II Diabetes, Neuropathy, Received Chemotherapy, Received Radiation Date Acquired: 12/25/2017 N/A N/A Weeks of Treatment: 20 N/A N/A Wound Status: Open N/A N/A Pending Amputation on Yes N/A N/A Presentation: Measurements L x W x D 0.2x0.2x0.2 N/A N/A (cm) Area (cm) : 0.031 N/A N/A Volume (cm) : 0.006 N/A N/A % Reduction in Area: 34.00% N/A N/A % Reduction in Volume: -20.00% N/A N/A Classification: Grade 1 N/A N/A Exudate Amount: Small N/A N/A Exudate Type: Purulent N/A N/A Exudate Color: yellow, brown, green N/A N/A Wound Margin: Flat and Intact N/A N/A Granulation Amount: None Present (0%) N/A N/A Settlemire, Wataru T. ([id number]) Necrotic Amount: Medium (34-66%) N/A N/A Exposed Structures: Fat Layer (Subcutaneous N/A N/A Tissue) Exposed: Yes Fascia: No Tendon: No Muscle: No Joint: No Bone: No Epithelialization: Medium (34-66%) N/A N/A Periwound Skin Texture: Callus: Yes N/A N/A Excoriation: No Induration: No Crepitus: No Rash: No Scarring: No Periwound Skin Moisture: Maceration: No N/A N/A Dry/Scaly: No Periwound Skin Color: Atrophie Blanche: No N/A N/A Cyanosis:  No Ecchymosis: No Erythema: No Hemosiderin Staining: No Mottled: No Pallor: No Rubor: No Temperature: No Abnormality N/A N/A Tenderness on Palpation: Yes N/A N/A Wound Preparation: Ulcer Cleansing: N/A N/A Rinsed/Irrigated with Saline Topical Anesthetic Applied: Other: lidocaine 4% Treatment Notes Electronic Signature(s) Signed: 09/09/2018 4:11:20 PM By: Montey Hora Entered By: Montey Hora on 09/06/2018 11:14:24 Brooker, Huntley Dec ([id number]) -------------------------------------------------------------------------------- [place] Details Patient Name: [name] Date of Service: 09/06/2018 10:15 AM Medical Record Number: [id number] Patient Account Number: [id number] Date of Birth/Sex: 1951-09-23 (67 y.o. M) Treating RN: Montey Hora Primary Care Marquel Spoto: BESS, KATY Other Clinician: Referring Ulis Kaps: BESS, KATY Treating Liza Czerwinski/Extender: STONE III, HOYT Weeks in Treatment: 20 Active Inactive ` Abuse / Safety / Falls / Self Care Management Nursing Diagnoses: Potential for falls Goals: Patient will remain injury free related to falls Date Initiated: 04/19/2018 Target Resolution Date: 05/04/2018 Goal Status: Active Interventions: Assess fall risk on admission and as needed Notes: ` Nutrition Nursing Diagnoses: Impaired glucose control: actual or potential Goals: Patient/caregiver agrees to and verbalizes understanding of need to use nutritional supplements and/or vitamins as prescribed Date Initiated: 04/19/2018 Target Resolution Date: 06/07/2018 Goal Status: Active Patient/caregiver verbalizes understanding of need to maintain therapeutic glucose control per primary care physician Date Initiated: 04/19/2018 Target Resolution Date: 05/31/2018 Goal Status: Active Interventions: Assess HgA1c results as ordered upon admission and as needed Assess patient nutrition upon admission and as needed per policy Notes: ` Orientation to the  Wound Care Program Nursing Diagnoses: Knowledge deficit related to the wound healing center program Goals: Patient/caregiver will verbalize understanding of the [place] PHIL, MICHELS ([id number]) Date Initiated: 04/19/2018 Target Resolution Date: 06/29/2018 Goal Status: Active Interventions: Provide education on orientation to the wound center Notes: ` Wound/Skin Impairment Nursing Diagnoses: Impaired tissue integrity Goals: Ulcer/skin breakdown will heal within 14 weeks Date Initiated: 04/19/2018 Target Resolution Date: 06/29/2018 Goal Status: Active Interventions: Assess patient/caregiver ability to obtain necessary supplies Assess patient/caregiver ability to perform ulcer/skin care regimen upon admission and as needed Assess ulceration(s) every visit Notes: Electronic Signature(s) Signed: 09/09/2018 4:11:20 PM By: Montey Hora Entered By: Montey Hora on 09/06/2018 11:14:15 Barco, Huntley Dec ([id number]) -------------------------------------------------------------------------------- Pain Assessment Details Patient Name: Marcus Loll T. Date of Service: 09/06/2018 10:15 AM Medical Record Number: [id number] Patient Account Number: [id number] Date of Birth/Sex: [birth year] (67 y.o. M) Treating RN: Montey Hora  Primary Care Antwaun Buth: BESS, KATY Other Clinician: Referring Temple Ewart: BESS, KATY Treating Tamikia Chowning/Extender: STONE III, HOYT Weeks in Treatment: 20 Active Problems Location of Pain Severity and Description of Pain Patient Has Paino Yes Site Locations Rate the pain. Current Pain Level: 5 Pain Management and Medication Current Pain Management: Electronic Signature(s) Signed: 09/06/2018 10:27:44 AM By: Lorine Bears RCP, RRT, CHT Signed: 09/09/2018 4:11:20 PM By: Montey Hora Entered By: Lorine Bears on 09/06/2018 10:09:33 Bejar, Huntley Dec  ([id number]) -------------------------------------------------------------------------------- Patient/Caregiver Education Details Patient Name: [name] Date of Service: 09/06/2018 10:15 AM Medical Record Number: [id number] Patient Account Number: [id number] Date of Birth/Gender: [birth year] (67 y.o. M) Treating RN: Montey Hora Primary Care Physician: BESS, KATY Other Clinician: Referring Physician: Lillard Anes Treating Physician/Extender: Melburn Hake, HOYT Weeks in Treatment: 20 Education Assessment Education Provided To: Patient Education Topics Provided Wound/Skin Impairment: Handouts: Other: wound care as ordered Methods: Demonstration, Explain/Verbal Responses: State content correctly Electronic Signature(s) Signed: 09/09/2018 4:11:20 PM By: Montey Hora Entered By: Montey Hora on 09/06/2018 11:21:16 Padron, Huntley Dec ([id number]) -------------------------------------------------------------------------------- Wound Assessment Details Patient Name: Marcus Loll T. Date of Service: 09/06/2018 10:15 AM Medical Record Number: [id number] Patient Account Number: [id number] Date of Birth/Sex: 05-19-1951 (67 y.o. M) Treating RN: Secundino Ginger Primary Care Laydon Martis: BESS, KATY Other Clinician: Referring Durant Scibilia: BESS, KATY Treating Orlena Garmon/Extender: STONE III, HOYT Weeks in Treatment: 20 Wound Status Wound Number: 2 Primary Diabetic Wound/Ulcer of the Lower Extremity Etiology: Wound Location: Left Toe Great Secondary Trauma, Other Wounding Event: Trauma Etiology: Date Acquired: 12/25/2017 Wound Open Weeks Of Treatment: 20 Status: Clustered Wound: No Comorbid Lymphedema, Arrhythmia, Coronary Artery Pending Amputation On Presentation History: Disease, Hypertension, Myocardial Infarction, Type II Diabetes, Neuropathy, Received Chemotherapy, Received Radiation Photos Photo Uploaded By: Secundino Ginger on 09/06/2018 10:26:42 Wound Measurements Length: (cm) 0.2 Width: (cm)  0.2 Depth: (cm) 0.2 Area: (cm) 0.031 Volume: (cm) 0.006 % Reduction in Area: 34% % Reduction in Volume: -20% Epithelialization: Medium (34-66%) Tunneling: No Undermining: No Wound Description Classification: Grade 1 Foul Odor Wound Margin: Flat and Intact Slough/Fib Exudate Amount: Small Exudate Type: Purulent Exudate Color: yellow, brown, green After Cleansing: No rino Yes Wound Bed Granulation Amount: None Present (0%) Exposed Structure Necrotic Amount: Medium (34-66%) Fascia Exposed: No Necrotic Quality: Adherent Slough Fat Layer (Subcutaneous Tissue) Exposed: Yes Tendon Exposed: No Muscle Exposed: No Joint Exposed: No Disbrow, Huntley Dec ([id number]) Bone Exposed: No Periwound Skin Texture Texture Color No Abnormalities Noted: No No Abnormalities Noted: No Callus: Yes Atrophie Blanche: No Crepitus: No Cyanosis: No Excoriation: No Ecchymosis: No Induration: No Erythema: No Rash: No Hemosiderin Staining: No Scarring: No Mottled: No Pallor: No Moisture Rubor: No No Abnormalities Noted: No Dry / Scaly: No Temperature / Pain Maceration: No Temperature: No Abnormality Tenderness on Palpation: Yes Wound Preparation Ulcer Cleansing: Rinsed/Irrigated with Saline Topical Anesthetic Applied: Other: lidocaine 4%, Treatment Notes Wound #2 (Left Toe Great) 1. Cleansed with: Clean wound with Normal Saline 2. Anesthetic Topical Lidocaine 4% cream to wound bed prior to debridement 4. Dressing Applied: Calcium Alginate with Silver 5. Secondary Dressing Applied Dry Gauze Foam Kerlix/Conform Notes SIlvercell, foam, secured with conform and tape. darco with peg assist Electronic Signature(s) Signed: 09/06/2018 3:36:43 PM By: Secundino Ginger Entered By: Secundino Ginger on 09/06/2018 10:20:59 Chartrand, Huntley Dec ([id number]) -------------------------------------------------------------------------------- [place] Details Patient Name: [name] Date of Service:  09/06/2018 10:15 AM Medical Record Number: [id number] Patient Account Number: [id number] Date of Birth/Sex: October 06, 1951 (67 y.o. M) Treating RN: Montey Hora Primary Care Jossie Smoot: BESS, Valetta Fuller  Other Clinician: Referring Alexica Schlossberg: BESS, KATY Treating Athanasios Heldman/Extender: STONE III, HOYT Weeks in Treatment: 20 Vital Signs Time Taken: 10:08 Temperature (F): 98.0 Height (in): 69 Pulse (bpm): 81 Weight (lbs): 238 Respiratory Rate (breaths/min): 16 Body Mass Index (BMI): 35.1 Blood Pressure (mmHg): 101/63 Reference Range: 80 - 120 mg / dl Electronic Signature(s) Signed: 09/06/2018 10:27:44 AM By: Lorine Bears RCP, RRT, CHT Entered By: Lorine Bears on 09/06/2018 10:12:57

## 2018-09-11 NOTE — Progress Notes (Addendum)
NICASIO, [name] ([id number]) Visit Report for 09/06/2018 Chief Complaint Document Details Patient Name: [name], [name]. Date of Service: 09/06/2018 10:15 AM Medical Record Number: [id number] Patient Account Number: [id number] Date of Birth/Sex: 27-Dec-1950 (67 y.o. M) Treating RN: Montey Hora Primary Care Provider: BESS, Valetta Fuller Other Clinician: Referring Provider: BESS, KATY Treating Provider/Extender: STONE III, HOYT Weeks in Treatment: 20 Information Obtained from: Patient Chief Complaint Left lower leg and left great toe ulcer Electronic Signature(s) Signed: 09/06/2018 10:43:54 PM By: Worthy Keeler PA-C Entered By: Worthy Keeler on 09/06/2018 11:09:45 Carbone, [name] ([id number]) -------------------------------------------------------------------------------- HPI Details Patient Name: [name]. Date of Service: 09/06/2018 10:15 AM Medical Record Number: [id number] Patient Account Number: [id number] Date of Birth/Sex: 1951/04/03 (67 y.o. M) Treating RN: Montey Hora Primary Care Provider: BESS, KATY Other Clinician: Referring Provider: BESS, KATY Treating Provider/Extender: STONE III, HOYT Weeks in Treatment: 20 History of Present Illness Associated Signs and Symptoms: Patient has a history of chronic venous insufficiency, diabetes mellitus type II, its reform fraction, hypertension HPI Description: 04/19/18 on evaluation today patient presents initially concerning an ulcer of his left great toe and left anterior shin. He states that the shin was definitely a trauma injury where he struck this on a metal pole injuring leg. In regard to the distal left great toe he's really not sure what happened here although he does have a significant callous noted as well. He does have a pacemaker. Fortunately his ABI was 0.98 on the right and 0.83 on the left this appears to be doing fairly well. Overall I'm pleased with that. Nonetheless he states that this really has not seem to  be improving. He has been tolerating the dressing changes without complication. The left anterior leg ulcer began on 04/05/18 according to what he tells [place] today although one note I did have for review said it was April 5. I'm inclined to believe it was the fifth she did have an office visit on 04/01/18 with his family nurse practitioner Faith Rogue. Nonetheless either way it's been going on this month for several weeks. The left great toe has actually been present since January 1 he does not know how this occurred. Fortunately has no pain at the site although he does have pain on the left anterior shin. His hemoglobin A1c is 8.0 in March 2019. 04/26/18 on evaluation today patient presents for follow-up concerning his life into your [place] also as well as his left great toe also. Fortunately the shin actually appears to be doing better on evaluation today in my opinion I'm not seeing as much in the way of fluffernutter on the surface although it does still require debridement today this is improved. Size wise it's really not much different. With that being said the total ulcer actually appears to be a little bit more macerated today I'm not sure why they state that several days ago when this was changed that was not the case. Fortunately is not having severe pain although both areas do hurt the shin is worse. 05/03/18 on evaluation today patient appears to be doing better in regard to his left first toe and left shin ulcers. He is been tolerating the dressing changes without complication and the good news is this seems to be showing signs of getting better. Overall I'm very pleased with the progress that has been made over the past week. Patient is still having pain especially in regard to the left shin. 05/10/18 on evaluation today patient appears to be doing  rather well in regard to his two ulcers. The toe ulcer appears to be smaller he does have some undermining however in this had to be  cleaned out just a little bit. With that being said overall I feel like this is showing signs of improvement. The left anterior shin ulcer also showing signs of improvement at this time. There does not appear to be any evidence of infection which is good news. 05/21/18 on evaluation today patient appears to be doing very well in regard to his left shin ulcer. He has been tolerating the dressing changes without complication. With that being said he is having issues at this point with his great toe on the left. We have not x-rayed that at this point I think we may need to. Nonetheless he has pain really at the roughly 5 o'clock location but nowhere else and I really cannot explain this I do not see any obvious form body. Nonetheless he continues to have issues with getting this area to close it's not progressing as nicely. 05/28/18 on evaluation today patient appears to be doing very well in regard to his left lower extremity anterior ulcer. He has been tolerating the dressing changes without complication. Overall this is making excellent progress with the collagen dressing. His left great toe x-ray did return and showed that he had no if you that normality. He did have a plantar heel spur but this is more degenerative. Otherwise just arthritis was noted no osteomyelitis and no obvious form body was visualized. Overall things seem to be going fairly well. With that being said he still has pain when looking at the toe from the plantar aspect at roughly the six back to the 4 o'clock location which is not quite as bad as last time but still hurts more than any other part of his wound. It does appear the Iodoflex has been of benefit however. 06/04/18 on evaluation today patient appears to be doing excellent in regard to his left anterior shin ulcer. This has made excellent progress and is showing signs of improving in fact very close to completely closing in healing. Nonetheless unfortunately his left great  toe has not faired quite as well as far as progress is concerned. He notes that after last week's Castillo, [name]. ([id number]) debridement he actually seem to be doing a little bit better for a couple of days until Thursday of last week when he had increases in discomfort as well is changes in the draining. He states that the drainage has been more green in color according to his wife and what he has seen. He's also had more discomfort. He relates to me again that he really feels like this all started when he stepped on a nail although he does not exactly remember it it seemed to be a puncture wound that he feels may be the culprit for why all this started. Nonetheless I am getting more concerned about the possibility of osteomyelitis despite the fact that the x-ray was negative I was hopeful in this interim since last week to this week that we would see some improvement in the overall appearance of the toe. Unfortunately if anything I feel like it's a little worse. It was macerated but I think we can attribute this to the fact that the patient did have an episode last night we got stuck in the rain and he thinks the dressing got wet. He did not change it I told him in the future if this were to  happen to please go ahead and change the dressing it won't hurt to change it more frequently in that situation. Otherwise other than the maceration I do not see any evidence of anything being any worse but it also does not appear to be any better. 06/11/18 on evaluation today patient's wounds both the great toe as well as the left shin both on the left side show signs of good improvement. Fortunately the patient's culture came back negative in regard to the toe and there does not appear to be any evidence of infection. His MRI is actually scheduled for 06/19/18. Fortunately this is right before I see him next week and then we can go to the results of the culture at that point. Patient is in agreement with that  plan. With that being said otherwise he has been showing signs of having less pain in regard to the toe which is also good news my hope is that he does not have osteomyelitis. 06/18/18 on evaluation today patient appears to be doing very well in regard to his left lower extremity ulcer. He also states at this point in time that he has been tolerating the dressing changes with the toe it appears also to be doing very well at this point. In general I'm happy with the progress that he seems to be making. With that being said he still continues to have pain in regard to the great toe ulcer fortunately this does not seem to be as significant as it was in the beginning but still nonetheless he is having some pain. No fevers, chills, nausea, or vomiting noted at this time. 06/25/18 on evaluation today patient actually appears to be doing very well in regard to his left anterior shin ulcer. He's been tolerating the dressing changes without complication in this seems to have done extremely well. In fact this appears to be healed. With that being said his toe ulcer still continues to get in trouble fortunately not as much as has been in the past and there have been some signs of improvement and better granulation although I still think he's getting a lot of callous to the area which seems to indicate a lot of pressure and friction. I believe he may benefit from a total contact cast especially now that the left shin ulcer has healed. 07/02/18 on evaluation today patient appears to be doing rather well in regard to his left anterior shin ulcer which is actually completely healed at this point. With that being said he has left great toe ulcer actually appears to be doing better he states over the weekend he actually had a significant amount of drainage from the toe after it had been hurting for some time. He states when his wife change the dressing there was a lot of discharge and fluid he actually took a picture he  showed me today. Since that time he has not noted as much tenderness he feels like something may have "popped out" 07/09/18 on evaluation today patient appears to be doing about the same in regard to his toe ulcer. He really has not shown any signs of significant improvement this seems to just be maintaining at best. He continues to be extremely active in fact I think this may be the biggest issue that he continues to do things as normal in fact right now he's remodeling the bathroom. Obviously I think he is a very active individual and as previously discussed I think a total contact cast would be of great  benefit for him. This was discussed with patient yet again today I think we are gonna proceed in this regard. 07/12/18 patient presents today for his first total contact cast change. He has had this on for three days and states that though he felt like it may be rubbing a little bit around his ankles there does not appear to be any injury at this point. Overall the wound actually looks better in my pinion. 07/16/18 on evaluation today patient's wound actually appears to be doing much better at this point. He has been tolerating the dressing changes without complication. With that being said I do believe the total contact cast has been of benefit he states he is having much less discomfort that he previously had. Overall I'm very pleased with how things have progressed in just one weeks worth of the total contact cast. 07/23/18 on evaluation today patient's toe ulcer on the left great toe actually appears to be showing signs of improvement which is good news. In general I'm very pleased with the progress that he has made in the cast over the past two weeks. Fortunately there does not appear to be any evidence of infection at this time. He also is not having as much maceration as was previously noted. The patient is happy in this regard that he notes that the boot and cast is not the most comfortable  thing in the world. 07/30/18-He is here in follow-up evaluation for left great toe ulcer. He presents in a total contact cast, after removal he is noted to be macerated with new injury to the dorsum of his left foot; he continues to work outside and this is most likely secondary to KINNETH, FUJIWARA T. ([id number]) excessive sweating versus wet from a shower. We will hold off on reapplying total contact cast, allowing the dorsal foot injury to resolve and allowing for complete drying as he will continue to work outside. He will have a nurse visit next Monday to evaluate dorsal foot partial thickness ulcer and moisture/maceration to foot and follow-up in 2 weeks 08/13/18 on evaluation today patient's toe ulcer actually appears to be doing about the same. Fortunately the dorsal foot ulcer has healed and his foot is not appearing to be as macerated. He continues to be overly active in my pinion in regard to the remodeling of his home he states he was actually finishing up some plumbing yesterday. Nonetheless I think that right now being outside the majority the day in a total contact cast in roughly 90 to 100o weather has been counterproductive not helpful for them in regard to healing the wound. I explained to him that if we're gonna get this to heal he may need to drop back a bit and take it easy to allow this to heal. He states that's not something is gonna likely be able to do. 08/20/18 on evaluation today patient actually appears to be doing a little better in regard to his toe ulcer. He's been tolerating the dressing changes much better he did not tolerate the front offloading shoe however. He states it actually caused his left knee to hurt that something that is been told before he probably needs replacement nonetheless he wasn't able to where the shoe past Friday so really only for about three days or so. Nonetheless we have not tried a PEG assist offloading shoe. I think this may be something that we  could try for him. In general I do feel like the wound size is slightly smaller  in particular in regard to the area of undermining. 08/27/18 on evaluation today patient actually appears to be doing possibly a bit better in regard to his left great toe ulcer. He's been tolerating the dressing changes without complication. With that being said the patient actually does show evidence of improvement although there is still an opening of the ulcer down to the bone. He is previously tested negative for osteomyelitis by way of MRI. There does not appear to be necrotic bone noted in the base of the wound. 09/03/18 on evaluation today patient actually appears to be doing slightly better in regard to the overall size of the wound although the depth is still just as significant. He does have bone noted in the base of the wound still unfortunately. He's not having as much discomfort which is good news. Again the bone does not appear to be necessarily necrotic. Overall I am happy with the appearance but nonetheless still not pleased with the fact that this has not gotten significantly better in the past week. He has been tolerating the dressing changes without complication. He still continues to remodel his house he states these get ready to finish the drywall and then paint. After that he states he may need to do some fishing. I think fishing can actually be beneficial for him I'm more concerned about the fact that if he does not back off as far as movement and getting around in general that is gonna end up with something more significant going on. Obviously he had no osteomyelitis previous but that could change in the future. 09/06/18 unfortunately we had to see the patient today for early follow-up due to the fact that he was having increased discomfort after having seen me on Tuesday. He states that even towards Tuesday afternoon and especially in the Wednesday he had a lot of pain due to the fact that he had  passed buildup in the toe. The only thing we change was adding the Prisma to be packed into the base of the wound in order to try to help with epithelialization. With that being said what I think may have happened is that he is continuing to have some kind of infection which is draining. This may even be a bone infection in them ride the negative when we previously performed this may have been too early to detect. Nonetheless it does seem that he clapped and fluid which filled up calls pressure and then subsequently as it started being squeezed in pushed out got a little bit better but in general he discontinued using the Prisma and things have seemed to improve over the last 24 hours. He's just using the silver alginate which has been better for him in general mainly due to the fact likely that it's helping to drain the excess fluid this drain. Fortunately there does not appear to be any evidence of worsening infection spreading up his foot. Electronic Signature(s) Signed: 09/06/2018 10:43:54 PM By: Worthy Keeler PA-C Entered By: Worthy Keeler on 09/06/2018 11:25:57 Kilker, [name] ([id number]) -------------------------------------------------------------------------------- Physical Exam Details Patient Name: [name] Date of Service: 09/06/2018 10:15 AM Medical Record Number: [id number] Patient Account Number: [id number] Date of Birth/Sex: 08-17-1951 (67 y.o. M) Treating RN: Montey Hora Primary Care Provider: BESS, Valetta Fuller Other Clinician: Referring Provider: BESS, KATY Treating Provider/Extender: STONE III, HOYT Weeks in Treatment: 36 Constitutional Well-nourished and well-hydrated in no acute distress. Respiratory normal breathing without difficulty. clear to auscultation bilaterally. Cardiovascular regular rate and rhythm with  normal S1, S2. Psychiatric this patient is able to make decisions and demonstrates good insight into disease process. Alert and Oriented x 3.  pleasant and cooperative. Notes At this point I'm not sure that he absolutely needs an antibiotic or if it's just that we need to be able to better manage the fluid control currently. Nonetheless being that the wound is not to erythematous though I did obtain a wound culture I'm probably gonna wait on put him on an antibiotic until I received the results back. He understands. Fortunately is Artie doing better just switching back to the silver so at this point. Electronic Signature(s) Signed: 09/06/2018 10:43:54 PM By: Worthy Keeler PA-C Entered By: Worthy Keeler on 09/06/2018 11:26:34 Sippel, [name] ([id number]) -------------------------------------------------------------------------------- Physician Orders Details Patient Name: [name]. Date of Service: 09/06/2018 10:15 AM Medical Record Number: [id number] Patient Account Number: [id number] Date of Birth/Sex: 1951-01-13 (67 y.o. M) Treating RN: Montey Hora Primary Care Provider: BESS, KATY Other Clinician: Referring Provider: BESS, KATY Treating Provider/Extender: STONE III, HOYT Weeks in Treatment: 20 Verbal / Phone Orders: No Diagnosis Coding ICD-10 Coding Code Description L97.526 Non-pressure chronic ulcer of other part of left foot with bone involvement without evidence of necrosis I87.2 Venous insufficiency (chronic) (peripheral) E11.621 Type 2 diabetes mellitus with foot ulcer I63.9 Cerebral infarction, unspecified I10 Essential (primary) hypertension Wound Cleansing Wound #2 Left Toe Great o Clean wound with Normal Saline. o Cleanse wound with mild soap and water Anesthetic (add to Medication List) Wound #2 Left Toe Great o Topical Lidocaine 4% cream applied to wound bed prior to debridement (In Clinic Only). Primary Wound Dressing Wound #2 Left Toe Great o Silver Alginate Secondary Dressing Wound #2 Left Toe Great o Gauze and Kerlix/Conform - secure with coban o Foam Dressing Change  Frequency Wound #2 Left Toe Great o Change dressing every other day. Follow-up Appointments Wound #2 Left Toe Great o Other: - Tuesday 09/17/18 Off-Loading Wound #2 Left Toe Great o Open toe surgical shoe with peg assist. o Other: - keep pressure off of area Additional Orders / Instructions Martensen, KAILO KOSIK ([id number]) Wound #2 Left Toe Great o Stop Smoking o Vitamin A; Vitamin C, Zinc - Please add a multivitamin with 100% of these o Increase protein intake. o Activity as tolerated Consults o Podiatry Laboratory o Bacteria identified in Wound by Culture (MICRO) - left great toe oooo LOINC Code: 9024-0 oooo Convenience Name: Wound culture routine Patient Medications Allergies: bee venom protein (honey bee), penicillin, oyster shell Notifications Medication Indication Start End doxycycline hyclate 09/10/2018 DOSE 1 - oral 100 mg capsule - 1 capsule oral taken 2 times a day for 14 days Electronic Signature(s) Signed: 09/10/2018 9:30:46 AM By: Worthy Keeler PA-C Previous Signature: 09/06/2018 10:43:54 PM Version By: Worthy Keeler PA-C Previous Signature: 09/09/2018 4:11:20 PM Version By: Montey Hora Entered By: Worthy Keeler on 09/10/2018 09:30:45 Sulton, [name] ([id number]) -------------------------------------------------------------------------------- Problem List Details Patient Name: [name] Date of Service: 09/06/2018 10:15 AM Medical Record Number: [id number] Patient Account Number: [id number] Date of Birth/Sex: January 24, 1951 (67 y.o. M) Treating RN: Montey Hora Primary Care Provider: BESS, Valetta Fuller Other Clinician: Referring Provider: BESS, KATY Treating Provider/Extender: Melburn Hake, HOYT Weeks in Treatment: 20 Active Problems ICD-10 Evaluated Encounter Code Description Active Date Today Diagnosis L97.526 Non-pressure chronic ulcer of other part of left foot with bone 04/19/2018 No Yes involvement without evidence of necrosis I87.2  Venous insufficiency (chronic) (peripheral) 04/19/2018 No Yes E11.621 Type 2 diabetes mellitus  with foot ulcer 04/19/2018 No Yes I63.9 Cerebral infarction, unspecified 04/19/2018 No Yes I10 Essential (primary) hypertension 04/19/2018 No Yes Inactive Problems Resolved Problems ICD-10 Code Description Active Date Resolved Date S81.812A Laceration without foreign body, left lower leg, initial encounter 04/19/2018 04/19/2018 O67.124 Non-pressure chronic ulcer of other part of left lower leg with fat 04/19/2018 04/19/2018 layer exposed Electronic Signature(s) Signed: 09/06/2018 10:43:54 PM By: Worthy Keeler PA-C Entered By: Worthy Keeler on 09/06/2018 11:09:38 Adderly, [name] ([id number]) -------------------------------------------------------------------------------- Progress Note Details Patient Name: [name] Date of Service: 09/06/2018 10:15 AM Medical Record Number: [id number] Patient Account Number: [id number] Date of Birth/Sex: 1951/05/31 (67 y.o. M) Treating RN: Montey Hora Primary Care Provider: BESS, KATY Other Clinician: Referring Provider: BESS, KATY Treating Provider/Extender: Melburn Hake, HOYT Weeks in Treatment: 20 Subjective Chief Complaint Information obtained from Patient Left lower leg and left great toe ulcer History of Present Illness (HPI) The following HPI elements were documented for the patient's wound: Associated Signs and Symptoms: Patient has a history of chronic venous insufficiency, diabetes mellitus type II, its reform fraction, hypertension 04/19/18 on evaluation today patient presents initially concerning an ulcer of his left great toe and left anterior shin. He states that the shin was definitely a trauma injury where he struck this on a metal pole injuring leg. In regard to the distal left great toe he's really not sure what happened here although he does have a significant callous noted as well. He does have a pacemaker. Fortunately his ABI was  0.98 on the right and 0.83 on the left this appears to be doing fairly well. Overall I'm pleased with that. Nonetheless he states that this really has not seem to be improving. He has been tolerating the dressing changes without complication. The left anterior leg ulcer began on 04/05/18 according to what he tells [place] today although one note I did have for review said it was April 5. I'm inclined to believe it was the fifth she did have an office visit on 04/01/18 with his family nurse practitioner Faith Rogue. Nonetheless either way it's been going on this month for several weeks. The left great toe has actually been present since January 1 he does not know how this occurred. Fortunately has no pain at the site although he does have pain on the left anterior shin. His hemoglobin A1c is 8.0 in March 2019. 04/26/18 on evaluation today patient presents for follow-up concerning his life into your lower [place] also as well as his left great toe also. Fortunately the shin actually appears to be doing better on evaluation today in my opinion I'm not seeing as much in the way of fluffernutter on the surface although it does still require debridement today this is improved. Size wise it's really not much different. With that being said the total ulcer actually appears to be a little bit more macerated today I'm not sure why they state that several days ago when this was changed that was not the case. Fortunately is not having severe pain although both areas do hurt the shin is worse. 05/03/18 on evaluation today patient appears to be doing better in regard to his left first toe and left shin ulcers. He is been tolerating the dressing changes without complication and the good news is this seems to be showing signs of getting better. Overall I'm very pleased with the progress that has been made over the past week. Patient is still having pain especially in regard to the left shin.  05/10/18 on evaluation today  patient appears to be doing rather well in regard to his two ulcers. The toe ulcer appears to be smaller he does have some undermining however in this had to be cleaned out just a little bit. With that being said overall I feel like this is showing signs of improvement. The left anterior shin ulcer also showing signs of improvement at this time. There does not appear to be any evidence of infection which is good news. 05/21/18 on evaluation today patient appears to be doing very well in regard to his left shin ulcer. He has been tolerating the dressing changes without complication. With that being said he is having issues at this point with his great toe on the left. We have not x-rayed that at this point I think we may need to. Nonetheless he has pain really at the roughly 5 o'clock location but nowhere else and I really cannot explain this I do not see any obvious form body. Nonetheless he continues to have issues with getting this area to close it's not progressing as nicely. 05/28/18 on evaluation today patient appears to be doing very well in regard to his left lower extremity anterior ulcer. He has been tolerating the dressing changes without complication. Overall this is making excellent progress with the collagen dressing. His left great toe x-ray did return and showed that he had no if you that normality. He did have a plantar heel spur Yasuda, Niranjan T. ([id number]) but this is more degenerative. Otherwise just arthritis was noted no osteomyelitis and no obvious form body was visualized. Overall things seem to be going fairly well. With that being said he still has pain when looking at the toe from the plantar aspect at roughly the six back to the 4 o'clock location which is not quite as bad as last time but still hurts more than any other part of his wound. It does appear the Iodoflex has been of benefit however. 06/04/18 on evaluation today patient appears to be doing excellent in regard to  his left anterior shin ulcer. This has made excellent progress and is showing signs of improving in fact very close to completely closing in healing. Nonetheless unfortunately his left great toe has not faired quite as well as far as progress is concerned. He notes that after last week's debridement he actually seem to be doing a little bit better for a couple of days until Thursday of last week when he had increases in discomfort as well is changes in the draining. He states that the drainage has been more green in color according to his wife and what he has seen. He's also had more discomfort. He relates to me again that he really feels like this all started when he stepped on a nail although he does not exactly remember it it seemed to be a puncture wound that he feels may be the culprit for why all this started. Nonetheless I am getting more concerned about the possibility of osteomyelitis despite the fact that the x-ray was negative I was hopeful in this interim since last week to this week that we would see some improvement in the overall appearance of the toe. Unfortunately if anything I feel like it's a little worse. It was macerated but I think we can attribute this to the fact that the patient did have an episode last night we got stuck in the rain and he thinks the dressing got wet. He did not change it I  told him in the future if this were to happen to please go ahead and change the dressing it won't hurt to change it more frequently in that situation. Otherwise other than the maceration I do not see any evidence of anything being any worse but it also does not appear to be any better. 06/11/18 on evaluation today patient's wounds both the great toe as well as the left shin both on the left side show signs of good improvement. Fortunately the patient's culture came back negative in regard to the toe and there does not appear to be any evidence of infection. His MRI is actually scheduled  for 06/19/18. Fortunately this is right before I see him next week and then we can go to the results of the culture at that point. Patient is in agreement with that plan. With that being said otherwise he has been showing signs of having less pain in regard to the toe which is also good news my hope is that he does not have osteomyelitis. 06/18/18 on evaluation today patient appears to be doing very well in regard to his left lower extremity ulcer. He also states at this point in time that he has been tolerating the dressing changes with the toe it appears also to be doing very well at this point. In general I'm happy with the progress that he seems to be making. With that being said he still continues to have pain in regard to the great toe ulcer fortunately this does not seem to be as significant as it was in the beginning but still nonetheless he is having some pain. No fevers, chills, nausea, or vomiting noted at this time. 06/25/18 on evaluation today patient actually appears to be doing very well in regard to his left anterior shin ulcer. He's been tolerating the dressing changes without complication in this seems to have done extremely well. In fact this appears to be healed. With that being said his toe ulcer still continues to get in trouble fortunately not as much as has been in the past and there have been some signs of improvement and better granulation although I still think he's getting a lot of callous to the area which seems to indicate a lot of pressure and friction. I believe he may benefit from a total contact cast especially now that the left shin ulcer has healed. 07/02/18 on evaluation today patient appears to be doing rather well in regard to his left anterior shin ulcer which is actually completely healed at this point. With that being said he has left great toe ulcer actually appears to be doing better he states over the weekend he actually had a significant amount of drainage  from the toe after it had been hurting for some time. He states when his wife change the dressing there was a lot of discharge and fluid he actually took a picture he showed me today. Since that time he has not noted as much tenderness he feels like something may have "popped out" 07/09/18 on evaluation today patient appears to be doing about the same in regard to his toe ulcer. He really has not shown any signs of significant improvement this seems to just be maintaining at best. He continues to be extremely active in fact I think this may be the biggest issue that he continues to do things as normal in fact right now he's remodeling the bathroom. Obviously I think he is a very active individual and as previously discussed I  think a total contact cast would be of great benefit for him. This was discussed with patient yet again today I think we are gonna proceed in this regard. 07/12/18 patient presents today for his first total contact cast change. He has had this on for three days and states that though he felt like it may be rubbing a little bit around his ankles there does not appear to be any injury at this point. Overall the wound actually looks better in my pinion. 07/16/18 on evaluation today patient's wound actually appears to be doing much better at this point. He has been tolerating the dressing changes without complication. With that being said I do believe the total contact cast has been of benefit he states he is having much less discomfort that he previously had. Overall I'm very pleased with how things have progressed in just one weeks worth of the total contact cast. Winer, [name] ([id number]) 07/23/18 on evaluation today patient's toe ulcer on the left great toe actually appears to be showing signs of improvement which is good news. In general I'm very pleased with the progress that he has made in the cast over the past two weeks. Fortunately there does not appear to be any  evidence of infection at this time. He also is not having as much maceration as was previously noted. The patient is happy in this regard that he notes that the boot and cast is not the most comfortable thing in the world. 07/30/18-He is here in follow-up evaluation for left great toe ulcer. He presents in a total contact cast, after removal he is noted to be macerated with new injury to the dorsum of his left foot; he continues to work outside and this is most likely secondary to excessive sweating versus wet from a shower. We will hold off on reapplying total contact cast, allowing the dorsal foot injury to resolve and allowing for complete drying as he will continue to work outside. He will have a nurse visit next Monday to evaluate dorsal foot partial thickness ulcer and moisture/maceration to foot and follow-up in 2 weeks 08/13/18 on evaluation today patient's toe ulcer actually appears to be doing about the same. Fortunately the dorsal foot ulcer has healed and his foot is not appearing to be as macerated. He continues to be overly active in my pinion in regard to the remodeling of his home he states he was actually finishing up some plumbing yesterday. Nonetheless I think that right now being outside the majority the day in a total contact cast in roughly 90 to 100 weather has been counterproductive not helpful for them in regard to healing the wound. I explained to him that if we're gonna get this to heal he may need to drop back a bit and take it easy to allow this to heal. He states that's not something is gonna likely be able to do. 08/20/18 on evaluation today patient actually appears to be doing a little better in regard to his toe ulcer. He's been tolerating the dressing changes much better he did not tolerate the front offloading shoe however. He states it actually caused his left knee to hurt that something that is been told before he probably needs replacement nonetheless he wasn't  able to where the shoe past Friday so really only for about three days or so. Nonetheless we have not tried a PEG assist offloading shoe. I think this may be something that we could try for him. In general I  do feel like the wound size is slightly smaller in particular in regard to the area of undermining. 08/27/18 on evaluation today patient actually appears to be doing possibly a bit better in regard to his left great toe ulcer. He's been tolerating the dressing changes without complication. With that being said the patient actually does show evidence of improvement although there is still an opening of the ulcer down to the bone. He is previously tested negative for osteomyelitis by way of MRI. There does not appear to be necrotic bone noted in the base of the wound. 09/03/18 on evaluation today patient actually appears to be doing slightly better in regard to the overall size of the wound although the depth is still just as significant. He does have bone noted in the base of the wound still unfortunately. He's not having as much discomfort which is good news. Again the bone does not appear to be necessarily necrotic. Overall I am happy with the appearance but nonetheless still not pleased with the fact that this has not gotten significantly better in the past week. He has been tolerating the dressing changes without complication. He still continues to remodel his house he states these get ready to finish the drywall and then paint. After that he states he may need to do some fishing. I think fishing can actually be beneficial for him I'm more concerned about the fact that if he does not back off as far as movement and getting around in general that is gonna end up with something more significant going on. Obviously he had no osteomyelitis previous but that could change in the future. 09/06/18 unfortunately we had to see the patient today for early follow-up due to the fact that he was having  increased discomfort after having seen me on Tuesday. He states that even towards Tuesday afternoon and especially in the Wednesday he had a lot of pain due to the fact that he had passed buildup in the toe. The only thing we change was adding the Prisma to be packed into the base of the wound in order to try to help with epithelialization. With that being said what I think may have happened is that he is continuing to have some kind of infection which is draining. This may even be a bone infection in them ride the negative when we previously performed this may have been too early to detect. Nonetheless it does seem that he clapped and fluid which filled up calls pressure and then subsequently as it started being squeezed in pushed out got a little bit better but in general he discontinued using the Prisma and things have seemed to improve over the last 24 hours. He's just using the silver alginate which has been better for him in general mainly due to the fact likely that it's helping to drain the excess fluid this drain. Fortunately there does not appear to be any evidence of worsening infection spreading up his foot. Patient History Information obtained from Patient. Family History Cancer - Father, Diabetes - Father, Heart Disease - Mother,Father, Hypertension - Mother, Kidney Disease - Child, No family history of Lung Disease, Seizures, Stroke, Thyroid Problems, Tuberculosis. KHYRON, GARNO ([id number]) Social History Current every day smoker - 1 - 1/2 pack daily, Marital Status - Married, Alcohol Use - Rarely, Drug Use - No History, Caffeine Use - Daily - coffee. Review of Systems (ROS) Constitutional Symptoms (General Health) Denies complaints or symptoms of Fever, Chills. Respiratory The patient has no  complaints or symptoms. Cardiovascular The patient has no complaints or symptoms. Psychiatric The patient has no complaints or symptoms. Objective Constitutional Well-nourished  and well-hydrated in no acute distress. Vitals Time Taken: 10:08 AM, Height: 69 in, Weight: 238 lbs, BMI: 35.1, Temperature: 98.0 F, Pulse: 81 bpm, Respiratory Rate: 16 breaths/min, Blood Pressure: 101/63 mmHg. Respiratory normal breathing without difficulty. clear to auscultation bilaterally. Cardiovascular regular rate and rhythm with normal S1, S2. Psychiatric this patient is able to make decisions and demonstrates good insight into disease process. Alert and Oriented x 3. pleasant and cooperative. General Notes: At this point I'm not sure that he absolutely needs an antibiotic or if it's just that we need to be able to better manage the fluid control currently. Nonetheless being that the wound is not to erythematous though I did obtain a wound culture I'm probably gonna wait on put him on an antibiotic until I received the results back. He understands. Fortunately is Artie doing better just switching back to the silver so at this point. Integumentary (Hair, Skin) Wound #2 status is Open. Original cause of wound was Trauma. The wound is located on the Left Toe Great. The wound measures 0.2cm length x 0.2cm width x 0.2cm depth; 0.031cm^2 area and 0.006cm^3 volume. There is Fat Layer (Subcutaneous Tissue) Exposed exposed. There is no tunneling or undermining noted. There is a small amount of purulent drainage noted. The wound margin is flat and intact. There is no granulation within the wound bed. There is a medium (34- 66%) amount of necrotic tissue within the wound bed including Adherent Slough. The periwound skin appearance exhibited: Callus. The periwound skin appearance did not exhibit: Crepitus, Excoriation, Induration, Rash, Scarring, Dry/Scaly, Maceration, Atrophie Blanche, Cyanosis, Ecchymosis, Hemosiderin Staining, Mottled, Pallor, Rubor, Erythema. Periwound temperature was noted as No Abnormality. The periwound has tenderness on palpation. ALEKSANDAR, DUVE  ([id number]) Assessment Active Problems ICD-10 Non-pressure chronic ulcer of other part of left foot with bone involvement without evidence of necrosis Venous insufficiency (chronic) (peripheral) Type 2 diabetes mellitus with foot ulcer Cerebral infarction, unspecified Essential (primary) hypertension Plan Wound Cleansing: Wound #2 Left Toe Great: Clean wound with Normal Saline. Cleanse wound with mild soap and water Anesthetic (add to Medication List): Wound #2 Left Toe Great: Topical Lidocaine 4% cream applied to wound bed prior to debridement (In Clinic Only). Primary Wound Dressing: Wound #2 Left Toe Great: Silver Alginate Secondary Dressing: Wound #2 Left Toe Great: Gauze and Kerlix/Conform - secure with coban Foam Dressing Change Frequency: Wound #2 Left Toe Great: Change dressing every other day. Follow-up Appointments: Wound #2 Left Toe Great: Other: - Tuesday 09/17/18 Off-Loading: Wound #2 Left Toe Great: Open toe surgical shoe with peg assist. Other: - keep pressure off of area Additional Orders / Instructions: Wound #2 Left Toe Great: Stop Smoking Vitamin A; Vitamin C, Zinc - Please add a multivitamin with 100% of these Increase protein intake. Activity as tolerated Consults ordered were: Podiatry Laboratory ordered were: Wound culture routine - left great toe The following medication(s) was prescribed: doxycycline hyclate oral 100 mg capsule 1 1 capsule oral taken 2 times a day for 14 days starting 09/10/2018 Lamphear, [name] ([id number]) My suggestion currently is gonna be that we await the results of the wound culture in order to initiate antibiotic therapy if need be. In the interim he will use the silver self and at this point I'm gonna recommend that we see him back for reevaluation on the 24th since we saw him today I  don't think he necessarily needs to come in on Tuesday. I will hello over again: the antibiotic if necessary. Otherwise I'm glad is  already starting to feel better in regard to the fact that this is less painful today than it was yesterday again I do believe the silver alginate is helping in that regard. Please see above for specific wound care orders. We will see patient for re-evaluation in 1 and one half week(s) here in the clinic. If anything worsens or changes patient will contact our office for additional recommendations. Electronic Signature(s) Signed: 09/10/2018 4:58:18 PM By: Worthy Keeler PA-C Previous Signature: 09/06/2018 10:43:54 PM Version By: Worthy Keeler PA-C Entered By: Worthy Keeler on 09/10/2018 09:31:03 Knezevic, [name] ([id number]) -------------------------------------------------------------------------------- ROS/PFSH Details Patient Name: [name]. Date of Service: 09/06/2018 10:15 AM Medical Record Number: [id number] Patient Account Number: [id number] Date of Birth/Sex: 03/06/1951 (67 y.o. M) Treating RN: Montey Hora Primary Care Provider: BESS, KATY Other Clinician: Referring Provider: BESS, KATY Treating Provider/Extender: STONE III, HOYT Weeks in Treatment: 20 Information Obtained From Patient Wound History Do you currently have one or more open woundso Yes How many open wounds do you currently haveo 2 Approximately how long have you had your woundso 2 weeks How have you been treating your wound(s) until nowo yes Doctor Has your wound(s) ever healed and then re-openedo No Have you had any lab work done in the past montho No Have you tested positive for an antibiotic resistant organism (MRSA, VRE)o No Have you tested positive for osteomyelitis (bone infection)o No Have you had any tests for circulation on your legso No Have you had other problems associated with your woundso Infection, Swelling Constitutional Symptoms (General Health) Complaints and Symptoms: Negative for: Fever; Chills Eyes Medical History: Negative for: Cataracts; Glaucoma; Optic  Neuritis Ear/Nose/Mouth/Throat Medical History: Negative for: Chronic sinus problems/congestion Hematologic/Lymphatic Medical History: Positive for: Lymphedema Negative for: Anemia; Hemophilia; Human Immunodeficiency Virus; Sickle Cell Disease Respiratory Complaints and Symptoms: No Complaints or Symptoms Medical History: Negative for: Aspiration; Asthma; Chronic Obstructive Pulmonary Disease (COPD); Pneumothorax; Sleep Apnea; Tuberculosis Cardiovascular Complaints and Symptoms: No Complaints or Symptoms Medical History: CHRISTPHER, STOGSDILL ([id number]) Positive for: Arrhythmia - pacemaker; Coronary Artery Disease; Hypertension; Myocardial Infarction Negative for: Deep Vein Thrombosis; Hypotension; Peripheral Arterial Disease; Peripheral Venous Disease; Phlebitis; Vasculitis Gastrointestinal Medical History: Negative for: Cirrhosis ; Colitis; Crohnos; Hepatitis A; Hepatitis B; Hepatitis C Endocrine Medical History: Positive for: Type II Diabetes Negative for: Type I Diabetes Time with diabetes: 5 years Treated with: Oral agents Blood sugar tested every day: Yes Tested : daily AM Blood sugar testing results: Breakfast: 174 Genitourinary Medical History: Negative for: End Stage Renal Disease Immunological Medical History: Negative for: Lupus Erythematosus; Raynaudos; Scleroderma Integumentary (Skin) Medical History: Negative for: History of Burn; History of pressure wounds Musculoskeletal Medical History: Negative for: Gout; Rheumatoid Arthritis; Osteoarthritis; Osteomyelitis Neurologic Medical History: Positive for: Neuropathy Negative for: Dementia; Quadriplegia; Paraplegia; Seizure Disorder Oncologic Medical History: Positive for: Received Chemotherapy; Received Radiation - Seeds prostate Psychiatric Complaints and Symptoms: No Complaints or Symptoms Medical History: Negative for: Anorexia/bulimia; Confinement Anxiety Kaufhold, SULAYMAN MANNING  ([id number]) Immunizations Pneumococcal Vaccine: Received Pneumococcal Vaccination: Yes Tetanus Vaccine: Last tetanus shot: 12/25/2017 Implantable Devices Family and Social History Cancer: Yes - Father; Diabetes: Yes - Father; Heart Disease: Yes - Mother,Father; Hypertension: Yes - Mother; Kidney Disease: Yes - Child; Lung Disease: No; Seizures: No; Stroke: No; Thyroid Problems: No; Tuberculosis: No; Current every day smoker - 1 - 1/2 pack daily; Marital  Status - Married; Alcohol Use: Rarely; Drug Use: No History; Caffeine Use: Daily - coffee; Financial Concerns: No; Food, Clothing or Shelter Needs: No; Support System Lacking: No; Transportation Concerns: No Physician Affirmation I have reviewed and agree with the above information. Electronic Signature(s) Signed: 09/06/2018 10:43:54 PM By: Worthy Keeler PA-C Signed: 09/09/2018 4:11:20 PM By: Montey Hora Entered By: Worthy Keeler on 09/06/2018 11:26:15 Grode, [name] ([id number]) -------------------------------------------------------------------------------- SuperBill Details Patient Name: [name]. Date of Service: 09/06/2018 Medical Record Number: [id number] Patient Account Number: [id number] Date of Birth/Sex: 1951/05/22 (67 y.o. M) Treating RN: Montey Hora Primary Care Provider: BESS, KATY Other Clinician: Referring Provider: BESS, KATY Treating Provider/Extender: STONE III, HOYT Weeks in Treatment: 20 Diagnosis Coding ICD-10 Codes Code Description L97.526 Non-pressure chronic ulcer of other part of left foot with bone involvement without evidence of necrosis I87.2 Venous insufficiency (chronic) (peripheral) E11.621 Type 2 diabetes mellitus with foot ulcer I63.9 Cerebral infarction, unspecified I10 Essential (primary) hypertension Facility Procedures CPT4 Code: [id number] Description: 99213 - WOUND CARE VISIT-LEV 3 EST PT Modifier: Quantity: 1 Physician Procedures CPT4: Description Modifier Quantity Code  [id number] 99214 - WC PHYS LEVEL 4 - EST PT 1 ICD-10 Diagnosis Description L97.526 Non-pressure chronic ulcer of other part of left foot with bone involvement without evidence of necrosis I87.2 Venous insufficiency  (chronic) (peripheral) E11.621 Type 2 diabetes mellitus with foot ulcer I63.9 Cerebral infarction, unspecified Electronic Signature(s) Signed: 09/06/2018 10:43:54 PM By: Worthy Keeler PA-C Entered By: Worthy Keeler on 09/06/2018 11:27:50

## 2018-09-12 ENCOUNTER — Encounter (HOSPITAL_COMMUNITY)
Admission: RE | Disposition: A | Discharge status: Home / Self Care | Payer: Self-pay | Source: Other Acute Inpatient Hospital | Attending: Urology

## 2018-09-12 ENCOUNTER — Other Ambulatory Visit: Payer: Self-pay

## 2018-09-12 ENCOUNTER — Encounter (HOSPITAL_COMMUNITY): Payer: Self-pay

## 2018-09-12 ENCOUNTER — Ambulatory Visit (HOSPITAL_COMMUNITY): Payer: PPO

## 2018-09-12 ENCOUNTER — Ambulatory Visit (HOSPITAL_COMMUNITY): Payer: PPO | Admitting: Anesthesiology

## 2018-09-12 ENCOUNTER — Ambulatory Visit (HOSPITAL_COMMUNITY)
Admission: RE | Admit: 2018-09-12 | Discharge: 2018-09-12 | Disposition: A | Discharge status: Home / Self Care | Payer: PPO | Source: Other Acute Inpatient Hospital | Attending: Urology | Admitting: Urology

## 2018-09-12 DIAGNOSIS — E1140 Type 2 diabetes mellitus with diabetic neuropathy, unspecified: Secondary | ICD-10-CM | POA: Insufficient documentation

## 2018-09-12 DIAGNOSIS — Z91013 Allergy to seafood: Secondary | ICD-10-CM | POA: Insufficient documentation

## 2018-09-12 DIAGNOSIS — Z951 Presence of aortocoronary bypass graft: Secondary | ICD-10-CM | POA: Diagnosis not present

## 2018-09-12 DIAGNOSIS — Z6831 Body mass index (BMI) 31.0-31.9, adult: Secondary | ICD-10-CM | POA: Diagnosis not present

## 2018-09-12 DIAGNOSIS — Z833 Family history of diabetes mellitus: Secondary | ICD-10-CM | POA: Insufficient documentation

## 2018-09-12 DIAGNOSIS — N200 Calculus of kidney: Secondary | ICD-10-CM | POA: Diagnosis not present

## 2018-09-12 DIAGNOSIS — Z8546 Personal history of malignant neoplasm of prostate: Secondary | ICD-10-CM | POA: Insufficient documentation

## 2018-09-12 DIAGNOSIS — I340 Nonrheumatic mitral (valve) insufficiency: Secondary | ICD-10-CM | POA: Diagnosis not present

## 2018-09-12 DIAGNOSIS — F17210 Nicotine dependence, cigarettes, uncomplicated: Secondary | ICD-10-CM | POA: Insufficient documentation

## 2018-09-12 DIAGNOSIS — I872 Venous insufficiency (chronic) (peripheral): Secondary | ICD-10-CM | POA: Diagnosis not present

## 2018-09-12 DIAGNOSIS — Z800 Family history of malignant neoplasm of digestive organs: Secondary | ICD-10-CM | POA: Insufficient documentation

## 2018-09-12 DIAGNOSIS — N4289 Other specified disorders of prostate: Secondary | ICD-10-CM | POA: Insufficient documentation

## 2018-09-12 DIAGNOSIS — I714 Abdominal aortic aneurysm, without rupture: Secondary | ICD-10-CM | POA: Diagnosis not present

## 2018-09-12 DIAGNOSIS — Z880 Allergy status to penicillin: Secondary | ICD-10-CM | POA: Diagnosis not present

## 2018-09-12 DIAGNOSIS — Z91030 Bee allergy status: Secondary | ICD-10-CM | POA: Diagnosis not present

## 2018-09-12 DIAGNOSIS — R310 Gross hematuria: Secondary | ICD-10-CM | POA: Diagnosis not present

## 2018-09-12 DIAGNOSIS — I10 Essential (primary) hypertension: Secondary | ICD-10-CM | POA: Diagnosis not present

## 2018-09-12 DIAGNOSIS — E782 Mixed hyperlipidemia: Secondary | ICD-10-CM | POA: Insufficient documentation

## 2018-09-12 DIAGNOSIS — I252 Old myocardial infarction: Secondary | ICD-10-CM | POA: Diagnosis not present

## 2018-09-12 DIAGNOSIS — E669 Obesity, unspecified: Secondary | ICD-10-CM | POA: Diagnosis not present

## 2018-09-12 DIAGNOSIS — I69359 Hemiplegia and hemiparesis following cerebral infarction affecting unspecified side: Secondary | ICD-10-CM | POA: Diagnosis not present

## 2018-09-12 DIAGNOSIS — N281 Cyst of kidney, acquired: Secondary | ICD-10-CM | POA: Insufficient documentation

## 2018-09-12 DIAGNOSIS — N210 Calculus in bladder: Secondary | ICD-10-CM | POA: Diagnosis not present

## 2018-09-12 DIAGNOSIS — I2510 Atherosclerotic heart disease of native coronary artery without angina pectoris: Secondary | ICD-10-CM | POA: Insufficient documentation

## 2018-09-12 DIAGNOSIS — Z923 Personal history of irradiation: Secondary | ICD-10-CM | POA: Insufficient documentation

## 2018-09-12 DIAGNOSIS — R319 Hematuria, unspecified: Secondary | ICD-10-CM | POA: Diagnosis not present

## 2018-09-12 HISTORY — PX: HOLMIUM LASER APPLICATION: SHX5852

## 2018-09-12 HISTORY — PX: CYSTOSCOPY WITH RETROGRADE PYELOGRAM, URETEROSCOPY AND STENT PLACEMENT: SHX5789

## 2018-09-12 LAB — GLUCOSE, CAPILLARY: Glucose-Capillary: 132 mg/dL — ABNORMAL HIGH (ref 70–99)

## 2018-09-12 LAB — PROTIME-INR
INR: 0.92
PROTHROMBIN TIME: 12.2 s (ref 11.4–15.2)

## 2018-09-12 SURGERY — CYSTOURETEROSCOPY, WITH RETROGRADE PYELOGRAM AND STENT INSERTION
Anesthesia: General | Laterality: Right

## 2018-09-12 MED ORDER — SODIUM CHLORIDE 0.9 % IR SOLN
Status: DC | PRN
  Administered 2018-09-12: 6000 mL

## 2018-09-12 MED ORDER — FENTANYL CITRATE (PF) 100 MCG/2ML IJ SOLN
INTRAMUSCULAR | Status: DC | PRN
  Administered 2018-09-12 (×3): 25 ug via INTRAVENOUS

## 2018-09-12 MED ORDER — PROPOFOL 10 MG/ML IV BOLUS
INTRAVENOUS | Status: AC
  Filled 2018-09-12: qty 20

## 2018-09-12 MED ORDER — MEPERIDINE HCL 50 MG/ML IJ SOLN: 6.2500 mg | INTRAMUSCULAR | Status: DC | PRN

## 2018-09-12 MED ORDER — ONDANSETRON HCL 4 MG/2ML IJ SOLN
INTRAMUSCULAR | Status: DC | PRN
  Administered 2018-09-12: 4 mg via INTRAVENOUS

## 2018-09-12 MED ORDER — DEXAMETHASONE SODIUM PHOSPHATE 10 MG/ML IJ SOLN
INTRAMUSCULAR | Status: AC
  Filled 2018-09-12: qty 1

## 2018-09-12 MED ORDER — LACTATED RINGERS IV SOLN
INTRAVENOUS | Status: DC
  Administered 2018-09-12: 09:00:00 via INTRAVENOUS

## 2018-09-12 MED ORDER — HYDROMORPHONE HCL 1 MG/ML IJ SOLN
INTRAMUSCULAR | Status: AC
  Filled 2018-09-12: qty 1

## 2018-09-12 MED ORDER — IOHEXOL 300 MG/ML  SOLN
INTRAMUSCULAR | Status: DC | PRN
  Administered 2018-09-12: 10 mL

## 2018-09-12 MED ORDER — PHENYLEPHRINE 40 MCG/ML (10ML) SYRINGE FOR IV PUSH (FOR BLOOD PRESSURE SUPPORT)
PREFILLED_SYRINGE | INTRAVENOUS | Status: DC | PRN
  Administered 2018-09-12: 120 ug via INTRAVENOUS
  Administered 2018-09-12: 80 ug via INTRAVENOUS
  Administered 2018-09-12: 120 ug via INTRAVENOUS

## 2018-09-12 MED ORDER — LIDOCAINE 2% (20 MG/ML) 5 ML SYRINGE
INTRAMUSCULAR | Status: AC
  Filled 2018-09-12: qty 5

## 2018-09-12 MED ORDER — PHENYLEPHRINE 40 MCG/ML (10ML) SYRINGE FOR IV PUSH (FOR BLOOD PRESSURE SUPPORT)
PREFILLED_SYRINGE | INTRAVENOUS | Status: AC
  Filled 2018-09-12: qty 10

## 2018-09-12 MED ORDER — FENTANYL CITRATE (PF) 100 MCG/2ML IJ SOLN
INTRAMUSCULAR | Status: AC
  Filled 2018-09-12: qty 2

## 2018-09-12 MED ORDER — OXYBUTYNIN CHLORIDE 5 MG PO TABS: 5.0000 mg | ORAL_TABLET | Freq: Three times a day (TID) | ORAL | 1 refills | Status: DC | PRN

## 2018-09-12 MED ORDER — LIDOCAINE 2% (20 MG/ML) 5 ML SYRINGE
INTRAMUSCULAR | Status: DC | PRN
  Administered 2018-09-12: 100 mg via INTRAVENOUS

## 2018-09-12 MED ORDER — DEXAMETHASONE SODIUM PHOSPHATE 10 MG/ML IJ SOLN
INTRAMUSCULAR | Status: DC | PRN
  Administered 2018-09-12: 10 mg via INTRAVENOUS

## 2018-09-12 MED ORDER — ACETAMINOPHEN 10 MG/ML IV SOLN: 1000.0000 mg | Freq: Once | INTRAVENOUS | Status: DC | PRN

## 2018-09-12 MED ORDER — HYDROMORPHONE HCL 1 MG/ML IJ SOLN
0.2500 mg | INTRAMUSCULAR | Status: DC | PRN
  Administered 2018-09-12: 0.5 mg via INTRAVENOUS

## 2018-09-12 MED ORDER — HYDROCODONE-ACETAMINOPHEN 7.5-325 MG PO TABS: 1.0000 | ORAL_TABLET | Freq: Once | ORAL | Status: DC | PRN

## 2018-09-12 MED ORDER — TRAMADOL HCL 50 MG PO TABS: 50.0000 mg | ORAL_TABLET | Freq: Four times a day (QID) | ORAL | 0 refills | Status: DC | PRN

## 2018-09-12 MED ORDER — PROPOFOL 10 MG/ML IV BOLUS
INTRAVENOUS | Status: DC | PRN
  Administered 2018-09-12: 200 mg via INTRAVENOUS

## 2018-09-12 MED ORDER — PROMETHAZINE HCL 25 MG/ML IJ SOLN: 6.2500 mg | INTRAMUSCULAR | Status: DC | PRN

## 2018-09-12 MED ORDER — CIPROFLOXACIN IN D5W 400 MG/200ML IV SOLN
400.0000 mg | Freq: Once | INTRAVENOUS | Status: AC
  Administered 2018-09-12: 400 mg via INTRAVENOUS
  Filled 2018-09-12: qty 200

## 2018-09-12 MED ORDER — ONDANSETRON HCL 4 MG/2ML IJ SOLN
INTRAMUSCULAR | Status: AC
  Filled 2018-09-12: qty 2

## 2018-09-12 SURGICAL SUPPLY — 27 items
BAG URO CATCHER STRL LF (MISCELLANEOUS) ×3 IMPLANT
BASKET LASER NITINOL 1.9FR (BASKET) ×1 IMPLANT
BASKET ZERO TIP NITINOL 2.4FR (BASKET) IMPLANT
BSKT STON RTRVL 120 1.9FR (BASKET)
BSKT STON RTRVL ZERO TP 2.4FR (BASKET)
CATH INTERMIT  6FR 70CM (CATHETERS) IMPLANT
CLOTH BEACON ORANGE TIMEOUT ST (SAFETY) ×3 IMPLANT
COVER SURGICAL LIGHT HANDLE (MISCELLANEOUS) ×1 IMPLANT
EXTRACTOR STONE NITINOL NGAGE (UROLOGICAL SUPPLIES) ×2 IMPLANT
FIBER LASER FLEXIVA 365 (UROLOGICAL SUPPLIES) IMPLANT
FIBER LASER TRAC TIP (UROLOGICAL SUPPLIES) ×2 IMPLANT
GLOVE BIOGEL M 8.0 STRL (GLOVE) ×3 IMPLANT
GOWN STRL REUS W/ TWL XL LVL3 (GOWN DISPOSABLE) ×1 IMPLANT
GOWN STRL REUS W/TWL LRG LVL3 (GOWN DISPOSABLE) ×2 IMPLANT
GOWN STRL REUS W/TWL XL LVL3 (GOWN DISPOSABLE) ×3
GUIDEWIRE ANG ZIPWIRE 038X150 (WIRE) ×1 IMPLANT
GUIDEWIRE STR DUAL SENSOR (WIRE) ×5 IMPLANT
IV NS 1000ML (IV SOLUTION)
IV NS 1000ML BAXH (IV SOLUTION) ×1 IMPLANT
MANIFOLD NEPTUNE II (INSTRUMENTS) ×3 IMPLANT
PACK CYSTO (CUSTOM PROCEDURE TRAY) ×3 IMPLANT
SHEATH ACCESS URETERAL 54CM (SHEATH) ×2 IMPLANT
SHEATH URETERAL 12FRX35CM (MISCELLANEOUS) ×2 IMPLANT
STENT CONTOUR 6FRX26X.038 (STENTS) ×2 IMPLANT
TUBING CONNECTING 10 (TUBING) ×2 IMPLANT
TUBING CONNECTING 10' (TUBING) ×1
TUBING UROLOGY SET (TUBING) ×3 IMPLANT

## 2018-09-12 NOTE — H&P (Signed)
H&P  Chief Complaint: Blood in urine  History of Present Illness: 67 year old male with prior history of radiotherapy for prostate cancer, with persistent gross hematuria, presents for cystoscopy, right retrograde, right ureteroscopy, stone management as well as diagnostic evaluation of urothelial thickening in the right renal pelvis.  The patient has had intermittent gross hematuria and had cystoscopy revealing minimal radiation induced change to the bladder neck.  CT abdomen and pelvis revealed a complex cyst in the left kidney which necessitated further MRI.  This revealed urothelial thickening of the right renal pelvis somewhat suspicious for urothelial neoplasm.  Additionally, filling defect consistent with stone was seen.  He presents at this time for diagnostic evaluation as well as management of a renal calculus.  Past Medical History:  Diagnosis Date  . Abdominal aortic aneurysm (AAA) ([place])    3 cm per 07-02-18 US abdominal aorta US epic  . Chronic venous insufficiency LOWER EXTREMITIES  . Coronary artery disease CARDIOLOGIST - DR  [id number]-  LAST 1 WK AGO -- WILL REQUEST NOTE AND STRESS TEST  . Diabetes mellitus without complication ([place])    type 2  . Hematuria    last year  . Hyperglycemia   . Hyperlipidemia   . Hypertension   . Mixed dyslipidemia   . Myocardial infarction ([place])   . Neuropathy    toes only  . Nocturia   . Prostate cancer ([place]) 08/18/11   gleason 8, volume 24.4cc  . S/P CABG x 4   . ST elevation MI (STEMI) ([place]) 02-10-2008   S/P CABG  . Stroke [facility]) 2016   occ trouble wriing with right hand  . Urinary hesitancy   . Vision abnormalities    resolved now  . Wound discharge    since jan 2019 goes to wound center [place] left great toe small open area changes dressing q 2 days with ointment provided by wound center, clear drainage occ    Past Surgical History:  Procedure Laterality Date  . CARDIAC CATHETERIZATION  05/01/2012   grafts widely patent  .  CARDIOVASCULAR STRESS TEST  03-15-2010   INFERIOR WALL SCAR WITHOUT ANY MEANINGFUL ISCHEMIA/ EF 46% / LOW RISK SCAN  . CORONARY ARTERY BYPASS GRAFT  02-10-2008  DR Einar Gip   X4 VESSEL DISEASE / [facility] CABG  . CYSTOSCOPY  02/08/2012   Procedure: CYSTOSCOPY FLEXIBLE;  Surgeon: Franchot Gallo, MD;  Location: [facility];  Service: Urology;;  . EP IMPLANTABLE DEVICE N/A 08/14/2016   Procedure: Loop Recorder Insertion;  Surgeon: Evans Lance, MD;  Location: [place] [facility];  Service: Cardiovascular;  Laterality: N/A;  . EXTRACORPOREAL SHOCK WAVE LITHOTRIPSY  2013  . KNEE SURGERY  age 71   RIGHT  . LEFT HEART CATHETERIZATION WITH CORONARY/GRAFT ANGIOGRAM N/A 05/01/2012   Procedure: LEFT HEART CATHETERIZATION WITH Beatrix Fetters;  Surgeon: Sanda Klein, MD;  Location: [place] CATH LAB;  Service: Cardiovascular;  Laterality: N/A;  . MULTIPLE TEETH EXTRACTIONS (23)/ FOUR QUADRANT ALVEOLPLASTY/ MANDIBULAR LATERAL EXOSTOSES REDUCTIONS  03-24-2010   CHRONIC PERIODONTITIS  . RADIOACTIVE SEED IMPLANT  02/08/2012   Procedure: RADIOACTIVE SEED IMPLANT;  Surgeon: Franchot Gallo, MD;  Location: [facility];  Service: Urology;  Laterality: N/A;  C-ARM   . SHOULDER SURGERY  1982   LEFT  . TEE WITHOUT CARDIOVERSION N/A 08/14/2016   Procedure: TRANSESOPHAGEAL ECHOCARDIOGRAM (TEE);  Surgeon: Josue Hector, MD;  Location: [facility] ENDOSCOPY;  Service: Cardiovascular;  Laterality: N/A;    Home Medications:  Allergies as of 09/12/2018  Reactions   Bee Venom Anaphylaxis   Oysters [shellfish Allergy] Swelling   Swelling of hands   Penicillins Other (See Comments)   CAUSES "FREE BLEEDING" Has patient had a PCN reaction causing immediate rash, facial/tongue/throat swelling, SOB or lightheadedness with hypotension: No Has patient had a PCN reaction causing severe rash involving mucus membranes or skin necrosis: No Has patient had a PCN reaction that required hospitalization  No Has patient had a PCN reaction occurring within the last 10 years: No If all of the above answers are "NO", then may proceed with Cephalosporin use.      Medication List    Notice   Cannot display discharge medications because the patient has not yet been admitted.     Allergies:  Allergies  Allergen Reactions  . Bee Venom Anaphylaxis  . Oysters [Shellfish Allergy] Swelling    Swelling of hands  . Penicillins Other (See Comments)    CAUSES "FREE BLEEDING" Has patient had a PCN reaction causing immediate rash, facial/tongue/throat swelling, SOB or lightheadedness with hypotension: No Has patient had a PCN reaction causing severe rash involving mucus membranes or skin necrosis: No Has patient had a PCN reaction that required hospitalization No Has patient had a PCN reaction occurring within the last 10 years: No If all of the above answers are "NO", then may proceed with Cephalosporin use.    Family History  Problem Relation Age of Onset  . Cancer Father        pancreatic  . Diabetes Father     Social History:  reports that he has been smoking cigarettes. He has a 75.00 pack-year smoking history. He has never used smokeless tobacco. He reports that he drank about 2.0 standard drinks of alcohol per week. He reports that he does not use drugs.  ROS: A complete review of systems was performed.  All systems are negative except for pertinent findings as noted.  Physical Exam:  Vital signs in last 24 hours:   Constitutional:  Alert and oriented, No acute distress Cardiovascular: Regular rate  Respiratory: Normal respiratory effort GI: Abdomen is soft, nontender, nondistended, no abdominal masses Genitourinary: No CVAT. Normal male phallus, testes are descended bilaterally and non-tender and without masses, scrotum is normal in appearance without lesions or masses, perineum is normal on inspection. Lymphatic: No lymphadenopathy Neurologic: Grossly intact, no focal  deficits Psychiatric: Normal mood and affect  Laboratory Data:  No results for input(s): WBC, HGB, HCT, PLT in the last 72 hours.  No results for input(s): NA, K, CL, GLUCOSE, BUN, CALCIUM, CREATININE in the last 72 hours.  Invalid input(s): CO3   No results found for this or any previous visit (from the past 24 hour(s)). Recent Results (from the past 240 hour(s))  Aerobic Culture (superficial specimen)     Status: None   Collection Time: 09/06/18 11:16 AM  Result Value Ref Range Status   Specimen Description   Final    TOE Performed at [facility], [address], [place], [place] [address]    Special Requests   Final    NONE Performed at [facility], [place], [place] [address]    Gram Stain   Final    RARE WBC PRESENT, PREDOMINANTLY PMN FEW GRAM POSITIVE COCCI Performed at [facility], [place] [address], [place], [place] [address]    Culture   Final    MODERATE METHICILLIN RESISTANT STAPHYLOCOCCUS AUREUS   Report Status 09/08/2018 FINAL  Final   Organism  ID, Bacteria METHICILLIN RESISTANT STAPHYLOCOCCUS AUREUS  Final      Susceptibility   Methicillin resistant staphylococcus aureus - MIC*    CIPROFLOXACIN >=8 RESISTANT Resistant     ERYTHROMYCIN >=8 RESISTANT Resistant     GENTAMICIN <=0.5 SENSITIVE Sensitive     OXACILLIN >=4 RESISTANT Resistant     TETRACYCLINE <=1 SENSITIVE Sensitive     VANCOMYCIN <=0.5 SENSITIVE Sensitive     TRIMETH/SULFA >=320 RESISTANT Resistant     CLINDAMYCIN >=8 RESISTANT Resistant     RIFAMPIN <=0.5 SENSITIVE Sensitive     Inducible Clindamycin NEGATIVE Sensitive     * MODERATE METHICILLIN RESISTANT STAPHYLOCOCCUS AUREUS    Renal Function: No results for input(s): CREATININE in the last 168 hours. Estimated Creatinine Clearance: 107.5 mL/min (by C-G formula based on SCr of 0.79 mg/dL).  Radiologic Imaging: No results found.  Impression/Assessment:  Gross hematuria, possible right renal  pelvic stone  Plan:  Cystoscopy, right retrograde ureteropyelogram, right ureteroscopy, possible biopsy of right renal pelvis, management of right renal stone, possible double-J stent placement

## 2018-09-12 NOTE — Discharge Instructions (Signed)

## 2018-09-12 NOTE — Op Note (Signed)
Preoperative diagnosis: Gross hematuria with right renal calculus, rule out neoplasm of right renal pelvis  Postoperative diagnosis: Right renal pelvic stones, no evidence of urothelial abnormality of right renal pelvis, dystrophic calcifications in prostate  Principal procedure: Cystolitholapaxy of prostatic/bladder calcification, right retrograde ureteropyelogram, fluoroscopic interpretation, right ureteroscopy, holmium laser lithotripsy of renal calculi, extraction of renal calculi, placement of 6 French by 26 centimeter contour double-J stent without tether  Surgeon: Leighanna Kirn  Anesthesia: General with LMA  Complications: None  Estimated blood loss: None  Specimen: Stone fragments  Drains: Above mentioned stent  Indications: 67 year old male with history of radiotherapy to his prostate for prostate cancer several years ago.  He has had intermittent gross painless hematuria for quite a few weeks.  Evaluation included cystoscopy which revealed dystrophic calcifications of his prostate, normal bladder urothelium, but MRI revealed thickened urothelium of his right renal pelvis and probable right renal stone.  Left renal cyst was found to be Bosniak category 2 in nature.  He presents at this time for management of his right renal stone, but more importantly, ureteroscopic inspection of his right renal pelvis to rule out neoplasm.  I discussed the procedure with the patient and his partner.  Complications and risks include ureteral injury, infection, bleeding, anesthetic complications, among others.  He understands and accepts these, and desires to proceed.  Findings: There were 2 fairly large calcifications in the patient's prostatic urethra.  The largest of these was probably 4 x 6 mm in size.  Urothelium of the bladder appeared normal.  Ureteral orifices were normal in configuration location.  Retrograde study of the right ureter and pyelocalyceal system revealed a normal proximal mid and distal  ureter with normal caliber and no evidence of filling defects or strictures.  However, at the UPJ there was a filling defect consistent with a stone.  There was mild pyelocaliectasis on that right side.  Description of procedure: The patient was properly identified and marked in the holding area.  He received preoperative IV antibiotics.  Was then taken to the operating room where general anesthetic was administered with the LMA.  He was placed in the dorsolithotomy position.  Genitalia and perineum were prepped and draped.  Proper timeout was performed.  95 French panendoscope was advanced into his bladder under direct vision.  The above-mentioned stones were seen on the right prostatic urothelium.  I knocked these into the bladder with the beak of the scope.  These were then drained from the bladder.  Retrograde ureteropyelogram was performed with a 6 [place] open-ended catheter and Omnipaque with the above-mentioned findings.  Following this, sensor tip guidewire was advanced through the open-ended catheter, passed the obstructing UPJ stone and into the upper pole calyceal system where a curl was seen fluoroscopically.  I then removed the cystoscope.  I sequentially dilated the ureter first with the inner core and then the entire 12/14 ureteral access catheter.  The access catheter was removed.  I then passed the semirigid ureteroscope.  At first, the stone was encountered at the UPJ and just before laser fragmentation it was knocked into the renal pelvis with the irrigant under pressure.  I then remove the semirigid ureteroscope and over top of the guidewire placed the ureteral access catheter.  I then passed the flexible ureteroscope into the renal pelvis.  Pyelocalyceal system was thoroughly inspected.  There was no evidence of urothelial abnormality within the pelvis or calyceal system.  Urothelium was quite pale and nonerythematous.  I did not take any specimen  for cytology.  The stones were then fragmented  using the holmium laser passed through 200 m fiber.  Multiple small fragments were resulted.  The largest of these, anything over approximately 1 mm, were extracted through the ureteral access catheter using an engage basket.  Once the largest fragments were removed, I then passed the fiber again and with the frequency of the laser turned up to 50 and the power at 0.3, all remaining fragments were treated so that sand-like fragments were resulted.  I then reinspected the entire pyelocalyceal system.  No further stones were seen.  At this point the ureteroscope and access catheter were removed.  I then replaced the cystoscope over top of the guidewire, and a 26 cm x 6 French contour double-J stent with tether removed was placed using fluoroscopic and cystoscopic guidance.  Excellent curls were seen.  The bladder was then drained.  The scope was removed.  The patient was then awakened and taken to the PACU.  He tolerated the procedure well.

## 2018-09-12 NOTE — Interval H&P Note (Signed)
History and Physical Interval Note:  09/12/2018 9:25 AM  [name]  has presented today for surgery, with the diagnosis of RIGHT RENAL STONE, HEMATURIA  The various methods of treatment have been discussed with the patient and family. After consideration of risks, benefits and other options for treatment, the patient has consented to  Procedure(s): CYSTOSCOPY WITH RETROGRADE PYELOGRAM, URETEROSCOPY AND STENT PLACEMENT, STONE EXTRACTION (Right) HOLMIUM LASER APPLICATION (Right) as a surgical intervention .  The patient's history has been reviewed, patient examined, no change in status, stable for surgery.  I have reviewed the patient's chart and labs.  Questions were answered to the patient's satisfaction.     Lillette Boxer Varnell Orvis

## 2018-09-12 NOTE — Anesthesia Procedure Notes (Signed)
Procedure Name: LMA Insertion Date/Time: 09/12/2018 9:56 AM Performed by: Lind Covert, CRNA Pre-anesthesia Checklist: Patient identified, Emergency Drugs available, Suction available, Patient being monitored and Timeout performed Patient Re-evaluated:Patient Re-evaluated prior to induction Oxygen Delivery Method: Circle system utilized Preoxygenation: Pre-oxygenation with 100% oxygen Induction Type: IV induction LMA: LMA inserted LMA Size: 5.0 Tube type: Oral Number of attempts: 1 Placement Confirmation: positive ETCO2 and breath sounds checked- equal and bilateral Tube secured with: Tape Dental Injury: Teeth and Oropharynx as per pre-operative assessment

## 2018-09-12 NOTE — Anesthesia Preprocedure Evaluation (Addendum)
Anesthesia Evaluation  Patient identified by MRN, date of birth, ID band Patient awake    Reviewed: Allergy & Precautions, NPO status , Patient's Chart, lab work & pertinent test results  Airway Mallampati: II  TM Distance: >3 FB Neck ROM: Full    Dental no notable dental hx. (+) Edentulous Upper, Edentulous Lower   Pulmonary Current Smoker,    Pulmonary exam normal breath sounds clear to auscultation       Cardiovascular hypertension, Pt. on medications + CAD, + Past MI and + Peripheral Vascular Disease  Normal cardiovascular exam Rhythm:Regular Rate:Normal  08/14/16 Echo Left ventricle: Inferior wall hypokinesis. Systolic function was   mildly reduced. The estimated ejection fraction was in the range   of 45% to 50%. - Mitral valve: Mildly calcified annulus. There was mild   regurgitation.   Neuro/Psych RUE weakness CVA, Residual Symptoms    GI/Hepatic negative GI ROS,   Endo/Other  diabetes, Type 2, Insulin Dependent  Renal/GU      Musculoskeletal   Abdominal (+) + obese,   Peds  Hematology   Anesthesia Other Findings   Reproductive/Obstetrics                            Anesthesia Physical Anesthesia Plan  ASA: III  Anesthesia Plan: General   Post-op Pain Management:    Induction: Intravenous  PONV Risk Score and Plan: 2 and Treatment may vary due to age or medical condition, Ondansetron and Dexamethasone  Airway Management Planned: Oral ETT  Additional Equipment:   Intra-op Plan:   Post-operative Plan: Extubation in OR  Informed Consent: I have reviewed the patients History and Physical, chart, labs and discussed the procedure including the risks, benefits and alternatives for the proposed anesthesia with the patient or authorized representative who has indicated his/her understanding and acceptance.   Dental advisory given  Plan Discussed with: CRNA  Anesthesia Plan  Comments:        Anesthesia Quick Evaluation

## 2018-09-12 NOTE — Transfer of Care (Signed)
Immediate Anesthesia Transfer of Care Note  Patient: [name]  Procedure(s) Performed: CYSTOSCOPY WITH RETROGRADE PYELOGRAM, URETEROSCOPY AND STENT PLACEMENT, STONE EXTRACTION (Right ) HOLMIUM LASER APPLICATION (Right )  Patient Location: PACU  Anesthesia Type:General  Level of Consciousness: sedated  Airway & Oxygen Therapy: Patient Spontanous Breathing and Patient connected to face mask oxygen  Post-op Assessment: Report given to RN and Post -op Vital signs reviewed and stable  Post vital signs: Reviewed and stable  Last Vitals:  Vitals Value Taken Time  BP    Temp    Pulse    Resp    SpO2      Last Pain:  Vitals:   09/12/18 0904  TempSrc:   PainSc: 0-No pain         Complications: No apparent anesthesia complications

## 2018-09-13 ENCOUNTER — Encounter (HOSPITAL_COMMUNITY): Payer: Self-pay | Admitting: Urology

## 2018-09-13 NOTE — Anesthesia Postprocedure Evaluation (Signed)
Anesthesia Post Note  Patient: TRAEGER SULTANA  Procedure(s) Performed: CYSTOSCOPY WITH RETROGRADE PYELOGRAM, URETEROSCOPY AND STENT PLACEMENT, STONE EXTRACTION (Right ) HOLMIUM LASER APPLICATION (Right )     Patient location during evaluation: PACU Anesthesia Type: General Level of consciousness: awake and alert Pain management: pain level controlled Vital Signs Assessment: post-procedure vital signs reviewed and stable Respiratory status: spontaneous breathing, nonlabored ventilation, respiratory function stable and patient connected to nasal cannula oxygen Cardiovascular status: blood pressure returned to baseline and stable Postop Assessment: no apparent nausea or vomiting Anesthetic complications: no    Last Vitals:  Vitals:   09/12/18 1200 09/12/18 1225  BP: 109/68 (!) 134/91  Pulse: 65 68  Resp: 20 16  Temp:  36.7 C  SpO2: 92% 95%    Last Pain:  Vitals:   09/12/18 1225  TempSrc:   PainSc: 0-No pain                 Barnet Glasgow

## 2018-09-16 ENCOUNTER — Ambulatory Visit
Admission: RE | Admit: 2018-09-16 | Discharge: 2018-09-16 | Disposition: A | Discharge status: Home / Self Care | Payer: PPO | Source: Ambulatory Visit | Attending: Adult Health | Admitting: Adult Health

## 2018-09-16 DIAGNOSIS — R911 Solitary pulmonary nodule: Secondary | ICD-10-CM

## 2018-09-16 DIAGNOSIS — J439 Emphysema, unspecified: Secondary | ICD-10-CM | POA: Diagnosis not present

## 2018-09-17 ENCOUNTER — Encounter: Payer: PPO | Admitting: Physician Assistant

## 2018-09-17 DIAGNOSIS — E11621 Type 2 diabetes mellitus with foot ulcer: Secondary | ICD-10-CM | POA: Diagnosis not present

## 2018-09-17 DIAGNOSIS — L97522 Non-pressure chronic ulcer of other part of left foot with fat layer exposed: Secondary | ICD-10-CM | POA: Diagnosis not present

## 2018-09-17 DIAGNOSIS — L97822 Non-pressure chronic ulcer of other part of left lower leg with fat layer exposed: Secondary | ICD-10-CM | POA: Diagnosis not present

## 2018-09-17 DIAGNOSIS — E119 Type 2 diabetes mellitus without complications: Secondary | ICD-10-CM | POA: Diagnosis not present

## 2018-09-17 DIAGNOSIS — H40033 Anatomical narrow angle, bilateral: Secondary | ICD-10-CM | POA: Diagnosis not present

## 2018-09-18 LAB — CUP PACEART REMOTE DEVICE CHECK
Implantable Pulse Generator Implant Date: 20170821
MDC IDC SESS DTM: 20190831003948

## 2018-09-19 NOTE — Progress Notes (Signed)
REGINO, FOURNET ([id number]) Visit Report for 09/17/2018 Chief Complaint Document Details Patient Name: [name], [name]. Date of Service: 09/17/2018 8:30 AM Medical Record Number: [id number] Patient Account Number: [id number] Date of Birth/Sex: Oct 26, 1951 (67 y.o. M) Treating RN: Montey Hora Primary Care Provider: BESS, Valetta Fuller Other Clinician: Referring Provider: BESS, KATY Treating Provider/Extender: STONE III, HOYT Weeks in Treatment: 21 Information Obtained from: Patient Chief Complaint Left lower leg and left great toe ulcer Electronic Signature(s) Signed: 09/17/2018 5:36:35 PM By: Worthy Keeler PA-C Entered By: Worthy Keeler on 09/17/2018 08:48:08 [place], [name] ([id number]) -------------------------------------------------------------------------------- Debridement Details Patient Name: [name]. Date of Service: 09/17/2018 8:30 AM Medical Record Number: [id number] Patient Account Number: [id number] Date of Birth/Sex: [birth year] (67 y.o. M) Treating RN: Montey Hora Primary Care Provider: BESS, KATY Other Clinician: Referring Provider: BESS, KATY Treating Provider/Extender: STONE III, HOYT Weeks in Treatment: 21 Debridement Performed for Wound #2 Left Toe Great Assessment: Performed By: Physician STONE III, HOYT E., PA-C Debridement Type: Debridement Severity of Tissue Pre Fat layer exposed Debridement: Level of Consciousness (Pre- Awake and Alert procedure): Pre-procedure Verification/Time Yes - 08:52 Out Taken: Start Time: 08:52 Pain Control: Lidocaine 4% Topical Solution Total Area Debrided (L x W): 0.3 (cm) x 0.3 (cm) = 0.09 (cm) Tissue and other material Non-Viable, Callus debrided: Level: Non-Viable Tissue Debridement Description: Selective/Open Wound Instrument: Curette Bleeding: None End Time: 08:55 Procedural Pain: 0 Post Procedural Pain: 0 Response to Treatment: Procedure was tolerated well Level of Consciousness Awake and  Alert (Post-procedure): Post Debridement Measurements of Total Wound Length: (cm) 0.3 Width: (cm) 0.3 Depth: (cm) 0.2 Volume: (cm) 0.014 Character of Wound/Ulcer Post Debridement: Improved Severity of Tissue Post Debridement: Fat layer exposed Post Procedure Diagnosis Same as Pre-procedure Electronic Signature(s) Signed: 09/17/2018 5:36:35 PM By: Worthy Keeler PA-C Signed: 09/18/2018 5:04:30 PM By: Montey Hora Entered By: Montey Hora on 09/17/2018 08:55:17 [name], [name] ([id number]) -------------------------------------------------------------------------------- Debridement Details Patient Name: Marcus Loll T. Date of Service: 09/17/2018 8:30 AM Medical Record Number: [id number] Patient Account Number: [id number] Date of Birth/Sex: 04-23-1951 (67 y.o. M) Treating RN: Montey Hora Primary Care Provider: BESS, KATY Other Clinician: Referring Provider: BESS, KATY Treating Provider/Extender: STONE III, HOYT Weeks in Treatment: 21 Debridement Performed for Wound #4 Left,Lateral Lower Leg Assessment: Performed By: Physician STONE III, HOYT E., PA-C Debridement Type: Debridement Level of Consciousness (Pre- Awake and Alert procedure): Pre-procedure Verification/Time Yes - 08:55 Out Taken: Start Time: 08:55 Pain Control: Lidocaine 4% Topical Solution Total Area Debrided (L x W): 2 (cm) x 1.2 (cm) = 2.4 (cm) Tissue and other material Viable, Non-Viable, Slough, Subcutaneous, Slough debrided: Level: Skin/Subcutaneous Tissue Debridement Description: Excisional Instrument: Curette Bleeding: Minimum Hemostasis Achieved: Pressure End Time: 08:57 Procedural Pain: 0 Post Procedural Pain: 0 Response to Treatment: Procedure was tolerated well Level of Consciousness Awake and Alert (Post-procedure): Post Debridement Measurements of Total Wound Length: (cm) 2 Width: (cm) 1.2 Depth: (cm) 0.2 Volume: (cm) 0.377 Character of Wound/Ulcer Post Debridement:  Improved Post Procedure Diagnosis Same as Pre-procedure Electronic Signature(s) Signed: 09/17/2018 5:36:35 PM By: Worthy Keeler PA-C Signed: 09/18/2018 5:04:30 PM By: Montey Hora Entered By: Montey Hora on 09/17/2018 08:56:06 [name], [name] ([id number]) -------------------------------------------------------------------------------- HPI Details Patient Name: Marcus Loll T. Date of Service: 09/17/2018 8:30 AM Medical Record Number: [id number] Patient Account Number: [id number] Date of Birth/Sex: [birth year] (67 y.o. M) Treating RN: Montey Hora Primary Care Provider: BESS, Valetta Fuller Other Clinician: Referring Provider: BESS, KATY Treating Provider/Extender: STONE III, HOYT Weeks in Treatment: 21 History  of Present Illness Associated Signs and Symptoms: Patient has a history of chronic venous insufficiency, diabetes mellitus type II, its reform fraction, hypertension HPI Description: 04/19/18 on evaluation today patient presents initially concerning an ulcer of his left great toe and left anterior shin. He states that the shin was definitely a trauma injury where he struck this on a metal pole injuring leg. In regard to the distal left great toe he's really not sure what happened here although he does have a significant callous noted as well. He does have a pacemaker. Fortunately his ABI was 0.98 on the right and 0.83 on the left this appears to be doing fairly well. Overall I'm pleased with that. Nonetheless he states that this really has not seem to be improving. He has been tolerating the dressing changes without complication. The left anterior leg ulcer began on 04/05/18 according to what he tells [place] today although one note I did have for review said it was April 5. I'm inclined to believe it was the fifth she did have an office visit on 04/01/18 with his family nurse practitioner Faith Rogue. Nonetheless either way it's been going on this month for several weeks. The left great toe  has actually been present since January 1 he does not know how this occurred. Fortunately has no pain at the site although he does have pain on the left anterior shin. His hemoglobin A1c is 8.0 in March 2019. 04/26/18 on evaluation today patient presents for follow-up concerning his life into your [place] also as well as his left great toe also. Fortunately the shin actually appears to be doing better on evaluation today in my opinion I'm not seeing as much in the way of fluffernutter on the surface although it does still require debridement today this is improved. Size wise it's really not much different. With that being said the total ulcer actually appears to be a little bit more macerated today I'm not sure why they state that several days ago when this was changed that was not the case. Fortunately is not having severe pain although both areas do hurt the shin is worse. 05/03/18 on evaluation today patient appears to be doing better in regard to his left first toe and left shin ulcers. He is been tolerating the dressing changes without complication and the good news is this seems to be showing signs of getting better. Overall I'm very pleased with the progress that has been made over the past week. Patient is still having pain especially in regard to the left shin. 05/10/18 on evaluation today patient appears to be doing rather well in regard to his two ulcers. The toe ulcer appears to be smaller he does have some undermining however in this had to be cleaned out just a little bit. With that being said overall I feel like this is showing signs of improvement. The left anterior shin ulcer also showing signs of improvement at this time. There does not appear to be any evidence of infection which is good news. 05/21/18 on evaluation today patient appears to be doing very well in regard to his left shin ulcer. He has been tolerating the dressing changes without complication. With that being said  he is having issues at this point with his great toe on the left. We have not x-rayed that at this point I think we may need to. Nonetheless he has pain really at the roughly 5 o'clock location but nowhere else and I really cannot explain this  I do not see any obvious form body. Nonetheless he continues to have issues with getting this area to close it's not progressing as nicely. 05/28/18 on evaluation today patient appears to be doing very well in regard to his left lower extremity anterior ulcer. He has been tolerating the dressing changes without complication. Overall this is making excellent progress with the collagen dressing. His left great toe x-ray did return and showed that he had no if you that normality. He did have a plantar heel spur but this is more degenerative. Otherwise just arthritis was noted no osteomyelitis and no obvious form body was visualized. Overall things seem to be going fairly well. With that being said he still has pain when looking at the toe from the plantar aspect at roughly the six back to the 4 o'clock location which is not quite as bad as last time but still hurts more than any other part of his wound. It does appear the Iodoflex has been of benefit however. 06/04/18 on evaluation today patient appears to be doing excellent in regard to his left anterior shin ulcer. This has made excellent progress and is showing signs of improving in fact very close to completely closing in healing. Nonetheless unfortunately his left great toe has not faired quite as well as far as progress is concerned. He notes that after last week's Moga, REASON HELZER. ([id number]) debridement he actually seem to be doing a little bit better for a couple of days until Thursday of last week when he had increases in discomfort as well is changes in the draining. He states that the drainage has been more green in color according to his wife and what he has seen. He's also had more discomfort. He  relates to me again that he really feels like this all started when he stepped on a nail although he does not exactly remember it it seemed to be a puncture wound that he feels may be the culprit for why all this started. Nonetheless I am getting more concerned about the possibility of osteomyelitis despite the fact that the x-ray was negative I was hopeful in this interim since last week to this week that we would see some improvement in the overall appearance of the toe. Unfortunately if anything I feel like it's a little worse. It was macerated but I think we can attribute this to the fact that the patient did have an episode last night we got stuck in the rain and he thinks the dressing got wet. He did not change it I told him in the future if this were to happen to please go ahead and change the dressing it won't hurt to change it more frequently in that situation. Otherwise other than the maceration I do not see any evidence of anything being any worse but it also does not appear to be any better. 06/11/18 on evaluation today patient's wounds both the great toe as well as the left shin both on the left side show signs of good improvement. Fortunately the patient's culture came back negative in regard to the toe and there does not appear to be any evidence of infection. His MRI is actually scheduled for 06/19/18. Fortunately this is right before I see him next week and then we can go to the results of the culture at that point. Patient is in agreement with that plan. With that being said otherwise he has been showing signs of having less pain in regard to the toe which  is also good news my hope is that he does not have osteomyelitis. 06/18/18 on evaluation today patient appears to be doing very well in regard to his left lower extremity ulcer. He also states at this point in time that he has been tolerating the dressing changes with the toe it appears also to be doing very well at this point. In  general I'm happy with the progress that he seems to be making. With that being said he still continues to have pain in regard to the great toe ulcer fortunately this does not seem to be as significant as it was in the beginning but still nonetheless he is having some pain. No fevers, chills, nausea, or vomiting noted at this time. 06/25/18 on evaluation today patient actually appears to be doing very well in regard to his left anterior shin ulcer. He's been tolerating the dressing changes without complication in this seems to have done extremely well. In fact this appears to be healed. With that being said his toe ulcer still continues to get in trouble fortunately not as much as has been in the past and there have been some signs of improvement and better granulation although I still think he's getting a lot of callous to the area which seems to indicate a lot of pressure and friction. I believe he may benefit from a total contact cast especially now that the left shin ulcer has healed. 07/02/18 on evaluation today patient appears to be doing rather well in regard to his left anterior shin ulcer which is actually completely healed at this point. With that being said he has left great toe ulcer actually appears to be doing better he states over the weekend he actually had a significant amount of drainage from the toe after it had been hurting for some time. He states when his wife change the dressing there was a lot of discharge and fluid he actually took a picture he showed me today. Since that time he has not noted as much tenderness he feels like something may have "popped out" 07/09/18 on evaluation today patient appears to be doing about the same in regard to his toe ulcer. He really has not shown any signs of significant improvement this seems to just be maintaining at best. He continues to be extremely active in fact I think this may be the biggest issue that he continues to do things as normal in  fact right now he's remodeling the bathroom. Obviously I think he is a very active individual and as previously discussed I think a total contact cast would be of great benefit for him. This was discussed with patient yet again today I think we are gonna proceed in this regard. 07/12/18 patient presents today for his first total contact cast change. He has had this on for three days and states that though he felt like it may be rubbing a little bit around his ankles there does not appear to be any injury at this point. Overall the wound actually looks better in my pinion. 07/16/18 on evaluation today patient's wound actually appears to be doing much better at this point. He has been tolerating the dressing changes without complication. With that being said I do believe the total contact cast has been of benefit he states he is having much less discomfort that he previously had. Overall I'm very pleased with how things have progressed in just one weeks worth of the total contact cast. 07/23/18 on evaluation today patient's toe  ulcer on the left great toe actually appears to be showing signs of improvement which is good news. In general I'm very pleased with the progress that he has made in the cast over the past two weeks. Fortunately there does not appear to be any evidence of infection at this time. He also is not having as much maceration as was previously noted. The patient is happy in this regard that he notes that the boot and cast is not the most comfortable thing in the world. 07/30/18-He is here in follow-up evaluation for left great toe ulcer. He presents in a total contact cast, after removal he is noted to be macerated with new injury to the dorsum of his left foot; he continues to work outside and this is most likely secondary to AMOGH, KOMATSU T. ([id number]) excessive sweating versus wet from a shower. We will hold off on reapplying total contact cast, allowing the dorsal foot injury to  resolve and allowing for complete drying as he will continue to work outside. He will have a nurse visit next Monday to evaluate dorsal foot partial thickness ulcer and moisture/maceration to foot and follow-up in 2 weeks 08/13/18 on evaluation today patient's toe ulcer actually appears to be doing about the same. Fortunately the dorsal foot ulcer has healed and his foot is not appearing to be as macerated. He continues to be overly active in my pinion in regard to the remodeling of his home he states he was actually finishing up some plumbing yesterday. Nonetheless I think that right now being outside the majority the day in a total contact cast in roughly 90 to 100o weather has been counterproductive not helpful for them in regard to healing the wound. I explained to him that if we're gonna get this to heal he may need to drop back a bit and take it easy to allow this to heal. He states that's not something is gonna likely be able to do. 08/20/18 on evaluation today patient actually appears to be doing a little better in regard to his toe ulcer. He's been tolerating the dressing changes much better he did not tolerate the front offloading shoe however. He states it actually caused his left knee to hurt that something that is been told before he probably needs replacement nonetheless he wasn't able to where the shoe past Friday so really only for about three days or so. Nonetheless we have not tried a PEG assist offloading shoe. I think this may be something that we could try for him. In general I do feel like the wound size is slightly smaller in particular in regard to the area of undermining. 08/27/18 on evaluation today patient actually appears to be doing possibly a bit better in regard to his left great toe ulcer. He's been tolerating the dressing changes without complication. With that being said the patient actually does show evidence of improvement although there is still an opening of the  ulcer down to the bone. He is previously tested negative for osteomyelitis by way of MRI. There does not appear to be necrotic bone noted in the base of the wound. 09/03/18 on evaluation today patient actually appears to be doing slightly better in regard to the overall size of the wound although the depth is still just as significant. He does have bone noted in the base of the wound still unfortunately. He's not having as much discomfort which is good news. Again the bone does not appear to be necessarily necrotic. Overall  I am happy with the appearance but nonetheless still not pleased with the fact that this has not gotten significantly better in the past week. He has been tolerating the dressing changes without complication. He still continues to remodel his house he states these get ready to finish the drywall and then paint. After that he states he may need to do some fishing. I think fishing can actually be beneficial for him I'm more concerned about the fact that if he does not back off as far as movement and getting around in general that is gonna end up with something more significant going on. Obviously he had no osteomyelitis previous but that could change in the future. 09/06/18 unfortunately we had to see the patient today for early follow-up due to the fact that he was having increased discomfort after having seen me on Tuesday. He states that even towards Tuesday afternoon and especially in the Wednesday he had a lot of pain due to the fact that he had passed buildup in the toe. The only thing we change was adding the Prisma to be packed into the base of the wound in order to try to help with epithelialization. With that being said what I think may have happened is that he is continuing to have some kind of infection which is draining. This may even be a bone infection in them ride the negative when we previously performed this may have been too early to detect. Nonetheless it  does seem that he clapped and fluid which filled up calls pressure and then subsequently as it started being squeezed in pushed out got a little bit better but in general he discontinued using the Prisma and things have seemed to improve over the last 24 hours. He's just using the silver alginate which has been better for him in general mainly due to the fact likely that it's helping to drain the excess fluid this drain. Fortunately there does not appear to be any evidence of worsening infection spreading up his foot. 09/17/18 on evaluation today patient actually appears to be doing about the same in regard to his left great toe. With that being said he does not seem to show any evidence of infection at this time which is great news. He's also not having any excess fluid or moisture buildup which is also great news. In general I think this is stable but I'm still concerned about the fact it does not seem to be improving as well as I would like. Nonetheless he does have an appointment with try and foot on October 7. He does have a new wound on the left lateral lower extremity unfortunately this is due to a sheet-metal injury he tells me. This happened several days ago. This is causing a little bit of discomfort fortunately there does not appear to be any evidence of infection there is some Slough on the surface of this wound. Currently patient unfortunately is not wearing the offloading shoe that we have provided him at this time. In fact we have tried multiple ways of offloading his wound including a total contact cast which he was outside in so much actually caused more maceration due to the fact that he continues to work on remodeling his bathroom. I've also attempted front offloading shoe which she states he only wears if he's going out somewhere such as out to eat he does not wear it when he's around in the house. Nonetheless everything that I've attempted from the offloading standpoint really  does not seem to be something that he is able or willing to use unfortunately. [name], [name] ([id number]) Electronic Signature(s) Signed: 09/17/2018 5:36:35 PM By: Worthy Keeler PA-C Entered By: Worthy Keeler on 09/17/2018 09:08:29 Shishir, Krantz [name] ([id number]) -------------------------------------------------------------------------------- Physical Exam Details Patient Name: [name]. Date of Service: 09/17/2018 8:30 AM Medical Record Number: [id number] Patient Account Number: [id number] Date of Birth/Sex: 07/22/1951 (67 y.o. M) Treating RN: Montey Hora Primary Care Provider: BESS, Valetta Fuller Other Clinician: Referring Provider: BESS, KATY Treating Provider/Extender: STONE III, HOYT Weeks in Treatment: 21 Constitutional Well-nourished and well-hydrated in no acute distress. Respiratory normal breathing without difficulty. Psychiatric this patient is able to make decisions and demonstrates good insight into disease process. Alert and Oriented x 3. pleasant and cooperative. Notes Patient's wound bed actually shows evidence of again [facility] noted on the left lateral lower extremity ulcer which is new secondary to trauma. This was sharply debrided away to remove the slough and necrotic tissue from the surface of the wound he tolerated this without complication other than mild pain. In regard to the toe ulcer I mainly just remove callous from the surrounding area of the ulcer itself to prevent this from prematurely closing over. No subcutaneous tissue was removed at this point I still do probe down to bone. Electronic Signature(s) Signed: 09/17/2018 5:36:35 PM By: Worthy Keeler PA-C Entered By: Worthy Keeler on 09/17/2018 09:02:48 [place], [name] ([id number]) -------------------------------------------------------------------------------- Physician Orders Details Patient Name: [name]. Date of Service: 09/17/2018 8:30 AM Medical Record Number: [id number] Patient  Account Number: [id number] Date of Birth/Sex: 04-01-1951 (67 y.o. 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 o Cleanse wound with mild soap and water Wound #4 Left,Lateral Lower Leg o Clean wound with Normal Saline. o Cleanse wound with mild soap and water Anesthetic (add to Medication List) Wound #2 Left Toe Great o Topical Lidocaine 4% cream applied to wound bed prior to debridement (In Clinic Only). Wound #4 Left,Lateral Lower Leg o Topical Lidocaine 4% cream applied to wound bed prior to debridement (In Clinic Only). Primary Wound Dressing Wound #2 Left Toe Great o Silver Alginate Wound #4 Left,Lateral Lower Leg o Silver Collagen Secondary Dressing Wound #2 Left Toe Great o Gauze and Kerlix/Conform - secure with coban o Foam Wound #4 Left,Lateral Lower Leg o Other - coverlet or bandaid Dressing Change Frequency Wound #2 Left Toe Great o Change dressing every other day. VALE, MOUSSEAU ([id number]) Wound #4 Left,Lateral Lower Leg o Change dressing every other day. Follow-up Appointments Wound #2 Left Toe Great o Return Appointment in 1 week. Wound #4 Left,Lateral Lower Leg o Return Appointment in 1 week. Off-Loading Wound #2 Left Toe Great o Open toe surgical shoe with peg assist. o Other: - keep pressure off of area Wound #4 Left,Lateral Lower Leg o Open toe  surgical shoe with peg assist. o Other: - keep pressure off of area Additional Orders / Instructions Wound #2 Left Toe Great o Stop Smoking o Vitamin A; Vitamin C, Zinc - Please  add a multivitamin with 100% of these o Increase protein intake. o Activity as tolerated Wound #4 Left,Lateral Lower Leg o Stop Smoking o Vitamin A; Vitamin C, Zinc - Please add a multivitamin with 100% of these o Increase protein intake. o Activity as tolerated Electronic Signature(s) Signed: 09/17/2018 5:36:35 PM By: Worthy Keeler PA-C Signed: 09/18/2018 5:04:30 PM By: Montey Hora Entered By: Montey Hora on 09/17/2018 08:58:34 [name], [name] ([id number]) -------------------------------------------------------------------------------- Problem List Details Patient Name: Marcus Loll T. Date of Service: 09/17/2018 8:30 AM Medical Record Number: [id number] Patient Account Number: [id number] Date of Birth/Sex: 04-Mar-1951 (67 y.o. 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 04/19/2018 No Yes involvement without evidence of necrosis S81.802A Unspecified open wound, left lower leg, initial encounter 09/17/2018 No Yes I63.9 Cerebral infarction, unspecified 04/19/2018 No Yes I10 Essential (primary) hypertension 04/19/2018 No Yes Inactive Problems Resolved Problems ICD-10 Code Description Active Date Resolved Date S81.812A Laceration without foreign body, left lower leg, initial encounter 04/19/2018 04/19/2018 E99.371 Non-pressure chronic ulcer of other part of left  lower leg with fat 04/19/2018 04/19/2018 layer exposed Electronic Signature(s) Signed: 09/17/2018 5:36:35 PM By: Ralene Bathe ([id number]) Entered By: Worthy Keeler on 09/17/2018 09:01:46 [place], [name] ([id number]) -------------------------------------------------------------------------------- Progress Note Details Patient Name: Marcus Loll T. Date of Service: 09/17/2018 8:30 AM Medical Record Number: [id number] Patient Account Number: [id number] Date of Birth/Sex: 1951-06-18 (67 y.o. M) Treating RN: Montey Hora Primary Care Provider: BESS, KATY Other Clinician: Referring Provider: BESS, KATY Treating Provider/Extender: Melburn Hake, HOYT Weeks in Treatment: 21 Subjective Chief Complaint Information obtained from Patient Left lower leg and left great toe ulcer History of Present Illness (HPI) The following HPI elements were documented for the patient's wound: Associated Signs and Symptoms: Patient has a history of chronic venous insufficiency, diabetes mellitus type II, its reform fraction, hypertension 04/19/18 on evaluation today patient presents initially concerning an ulcer of his left great toe and left anterior shin. He states that the shin was definitely a trauma injury where he struck this on a metal pole injuring leg. In regard to the distal left great toe he's really not sure what happened here although he does have a significant callous noted as well. He does have a pacemaker. Fortunately his ABI was 0.98 on the right and 0.83 on the left this appears to be doing fairly well. Overall I'm pleased with that. Nonetheless he states that this really has not seem to be improving. He has been tolerating the dressing changes without complication. The left anterior leg ulcer began on 04/05/18 according to what he tells [place] today although one note I did have for review said it was April 5. I'm inclined to believe it was the fifth she did have an office visit  on 04/01/18 with his family nurse practitioner Faith Rogue. Nonetheless either way it's been going on this month for several weeks. The left great toe has actually been present since January 1 he does not know how this occurred. Fortunately has no pain at the site although he does have pain on the left anterior shin. His hemoglobin A1c is 8.0 in March 2019. 04/26/18  on evaluation today patient presents for follow-up concerning his life into your lower [place] also as well as his left great toe also. Fortunately the shin actually appears to be doing better on evaluation today in my opinion I'm not seeing as much in the way of fluffernutter on the surface although it does still require debridement today this is improved. Size wise it's really not much different. With that being said the total ulcer actually appears to be a little bit more macerated today I'm not sure why they state that several days ago when this was changed that was not the case. Fortunately is not having severe pain although both areas do hurt the shin is worse. 05/03/18 on evaluation today patient appears to be doing better in regard to his left first toe and left shin ulcers. He is been tolerating the dressing changes without complication and the good news is this seems to be showing signs of getting better. Overall I'm very pleased with the progress that has been made over the past week. Patient is still having pain especially in regard to the left shin. 05/10/18 on evaluation today patient appears to be doing rather well in regard to his two ulcers. The toe ulcer appears to be smaller he does have some undermining however in this had to be cleaned out just a little bit. With that being said overall I feel like this is showing signs of improvement. The left anterior shin ulcer also showing signs of improvement at this time. There does not appear to be any evidence of infection which is good news. 05/21/18 on evaluation today patient  appears to be doing very well in regard to his left shin ulcer. He has been tolerating the dressing changes without complication. With that being said he is having issues at this point with his great toe on the left. We have not x-rayed that at this point I think we may need to. Nonetheless he has pain really at the roughly 5 o'clock location but nowhere else and I really cannot explain this I do not see any obvious form body. Nonetheless he continues to have issues with getting this area to close it's not progressing as nicely. 05/28/18 on evaluation today patient appears to be doing very well in regard to his left lower extremity anterior ulcer. He has been tolerating the dressing changes without complication. Overall this is making excellent progress with the collagen dressing. His left great toe x-ray did return and showed that he had no if you that normality. He did have a plantar heel spur Doddridge, Jaquel T. ([id number]) but this is more degenerative. Otherwise just arthritis was noted no osteomyelitis and no obvious form body was visualized. Overall things seem to be going fairly well. With that being said he still has pain when looking at the toe from the plantar aspect at roughly the six back to the 4 o'clock location which is not quite as bad as last time but still hurts more than any other part of his wound. It does appear the Iodoflex has been of benefit however. 06/04/18 on evaluation today patient appears to be doing excellent in regard to his left anterior shin ulcer. This has made excellent progress and is showing signs of improving in fact very close to completely closing in healing. Nonetheless unfortunately his left great toe has not faired quite as well as far as progress is concerned. He notes that after last week's debridement he actually seem to be doing a little bit  better for a couple of days until Thursday of last week when he had increases in discomfort as well is changes in  the draining. He states that the drainage has been more green in color according to his wife and what he has seen. He's also had more discomfort. He relates to me again that he really feels like this all started when he stepped on a nail although he does not exactly remember it it seemed to be a puncture wound that he feels may be the culprit for why all this started. Nonetheless I am getting more concerned about the possibility of osteomyelitis despite the fact that the x-ray was negative I was hopeful in this interim since last week to this week that we would see some improvement in the overall appearance of the toe. Unfortunately if anything I feel like it's a little worse. It was macerated but I think we can attribute this to the fact that the patient did have an episode last night we got stuck in the rain and he thinks the dressing got wet. He did not change it I told him in the future if this were to happen to please go ahead and change the dressing it won't hurt to change it more frequently in that situation. Otherwise other than the maceration I do not see any evidence of anything being any worse but it also does not appear to be any better. 06/11/18 on evaluation today patient's wounds both the great toe as well as the left shin both on the left side show signs of good improvement. Fortunately the patient's culture came back negative in regard to the toe and there does not appear to be any evidence of infection. His MRI is actually scheduled for 06/19/18. Fortunately this is right before I see him next week and then we can go to the results of the culture at that point. Patient is in agreement with that plan. With that being said otherwise he has been showing signs of having less pain in regard to the toe which is also good news my hope is that he does not have osteomyelitis. 06/18/18 on evaluation today patient appears to be doing very well in regard to his left lower extremity ulcer. He also  states at this point in time that he has been tolerating the dressing changes with the toe it appears also to be doing very well at this point. In general I'm happy with the progress that he seems to be making. With that being said he still continues to have pain in regard to the great toe ulcer fortunately this does not seem to be as significant as it was in the beginning but still nonetheless he is having some pain. No fevers, chills, nausea, or vomiting noted at this time. 06/25/18 on evaluation today patient actually appears to be doing very well in regard to his left anterior shin ulcer. He's been tolerating the dressing changes without complication in this seems to have done extremely well. In fact this appears to be healed. With that being said his toe ulcer still continues to get in trouble fortunately not as much as has been in the past and there have been some signs of improvement and better granulation although I still think he's getting a lot of callous to the area which seems to indicate a lot of pressure and friction. I believe he may benefit from a total contact cast especially now that the left shin ulcer has healed. 07/02/18 on evaluation  today patient appears to be doing rather well in regard to his left anterior shin ulcer which is actually completely healed at this point. With that being said he has left great toe ulcer actually appears to be doing better he states over the weekend he actually had a significant amount of drainage from the toe after it had been hurting for some time. He states when his wife change the dressing there was a lot of discharge and fluid he actually took a picture he showed me today. Since that time he has not noted as much tenderness he feels like something may have "popped out" 07/09/18 on evaluation today patient appears to be doing about the same in regard to his toe ulcer. He really has not shown any signs of significant improvement this seems to just  be maintaining at best. He continues to be extremely active in fact I think this may be the biggest issue that he continues to do things as normal in fact right now he's remodeling the bathroom. Obviously I think he is a very active individual and as previously discussed I think a total contact cast would be of great benefit for him. This was discussed with patient yet again today I think we are gonna proceed in this regard. 07/12/18 patient presents today for his first total contact cast change. He has had this on for three days and states that though he felt like it may be rubbing a little bit around his ankles there does not appear to be any injury at this point. Overall the wound actually looks better in my pinion. 07/16/18 on evaluation today patient's wound actually appears to be doing much better at this point. He has been tolerating the dressing changes without complication. With that being said I do believe the total contact cast has been of benefit he states he is having much less discomfort that he previously had. Overall I'm very pleased with how things have progressed in just one weeks worth of the total contact cast. [name], [name] ([id number]) 07/23/18 on evaluation today patient's toe ulcer on the left great toe actually appears to be showing signs of improvement which is good news. In general I'm very pleased with the progress that he has made in the cast over the past two weeks. Fortunately there does not appear to be any evidence of infection at this time. He also is not having as much maceration as was previously noted. The patient is happy in this regard that he notes that the boot and cast is not the most comfortable thing in the world. 07/30/18-He is here in follow-up evaluation for left great toe ulcer. He presents in a total contact cast, after removal he is noted to be macerated with new injury to the dorsum of his left foot; he continues to work outside and this is most  likely secondary to excessive sweating versus wet from a shower. We will hold off on reapplying total contact cast, allowing the dorsal foot injury to resolve and allowing for complete drying as he will continue to work outside. He will have a nurse visit next Monday to evaluate dorsal foot partial thickness ulcer and moisture/maceration to foot and follow-up in 2 weeks 08/13/18 on evaluation today patient's toe ulcer actually appears to be doing about the same. Fortunately the dorsal foot ulcer has healed and his foot is not appearing to be as macerated. He continues to be overly active in my pinion in regard to the remodeling of his  home he states he was actually finishing up some plumbing yesterday. Nonetheless I think that right now being outside the majority the day in a total contact cast in roughly 90 to 100 weather has been counterproductive not helpful for them in regard to healing the wound. I explained to him that if we're gonna get this to heal he may need to drop back a bit and take it easy to allow this to heal. He states that's not something is gonna likely be able to do. 08/20/18 on evaluation today patient actually appears to be doing a little better in regard to his toe ulcer. He's been tolerating the dressing changes much better he did not tolerate the front offloading shoe however. He states it actually caused his left knee to hurt that something that is been told before he probably needs replacement nonetheless he wasn't able to where the shoe past Friday so really only for about three days or so. Nonetheless we have not tried a PEG assist offloading shoe. I think this may be something that we could try for him. In general I do feel like the wound size is slightly smaller in particular in regard to the area of undermining. 08/27/18 on evaluation today patient actually appears to be doing possibly a bit better in regard to his left great toe ulcer. He's been tolerating the dressing  changes without complication. With that being said the patient actually does show evidence of improvement although there is still an opening of the ulcer down to the bone. He is previously tested negative for osteomyelitis by way of MRI. There does not appear to be necrotic bone noted in the base of the wound. 09/03/18 on evaluation today patient actually appears to be doing slightly better in regard to the overall size of the wound although the depth is still just as significant. He does have bone noted in the base of the wound still unfortunately. He's not having as much discomfort which is good news. Again the bone does not appear to be necessarily necrotic. Overall I am happy with the appearance but nonetheless still not pleased with the fact that this has not gotten significantly better in the past week. He has been tolerating the dressing changes without complication. He still continues to remodel his house he states these get ready to finish the drywall and then paint. After that he states he may need to do some fishing. I think fishing can actually be beneficial for him I'm more concerned about the fact that if he does not back off as far as movement and getting around in general that is gonna end up with something more significant going on. Obviously he had no osteomyelitis previous but that could change in the future. 09/06/18 unfortunately we had to see the patient today for early follow-up due to the fact that he was having increased discomfort after having seen me on Tuesday. He states that even towards Tuesday afternoon and especially in the Wednesday he had a lot of pain due to the fact that he had passed buildup in the toe. The only thing we change was adding the Prisma to be packed into the base of the wound in order to try to help with epithelialization. With that being said what I think may have happened is that he is continuing to have some kind of infection which is draining. This  may even be a bone infection in them ride the negative when we previously performed this may have been  too early to detect. Nonetheless it does seem that he clapped and fluid which filled up calls pressure and then subsequently as it started being squeezed in pushed out got a little bit better but in general he discontinued using the Prisma and things have seemed to improve over the last 24 hours. He's just using the silver alginate which has been better for him in general mainly due to the fact likely that it's helping to drain the excess fluid this drain. Fortunately there does not appear to be any evidence of worsening infection spreading up his foot. 09/17/18 on evaluation today patient actually appears to be doing about the same in regard to his left great toe. With that being said he does not seem to show any evidence of infection at this time which is great news. He's also not having any excess fluid or moisture buildup which is also great news. In general I think this is stable but I'm still concerned about the fact it does not seem to be improving as well as I would like. Nonetheless he does have an appointment with try and foot on October 7. He does have a new wound on the left lateral lower extremity unfortunately this is due to a sheet-metal injury he tells me. This happened several days ago. This is causing a little bit of discomfort fortunately there does not appear to be any evidence of infection there is some Slough on the surface of this wound. [name], [name] ([id number]) Currently patient unfortunately is not wearing the offloading shoe that we have provided him at this time. In fact we have tried multiple ways of offloading his wound including a total contact cast which he was outside in so much actually caused more maceration due to the fact that he continues to work on remodeling his bathroom. I've also attempted front offloading shoe which she states he only wears if he's  going out somewhere such as out to eat he does not wear it when he's around in the house. Nonetheless everything that I've attempted from the offloading standpoint really does not seem to be something that he is able or willing to use unfortunately. Patient History Information obtained from Patient. Family History Cancer - Father, Diabetes - Father, Heart Disease - Mother,Father, Hypertension - Mother, Kidney Disease - Child, No family history of Lung Disease, Seizures, Stroke, Thyroid Problems, Tuberculosis. Social History Current every day smoker - 1 - 1/2 pack daily, Marital Status - Married, Alcohol Use - Rarely, Drug Use - No History, Caffeine Use - Daily - coffee. Review of Systems (ROS) Constitutional Symptoms (General Health) Denies complaints or symptoms of Fever, Chills. Respiratory The patient has no complaints or symptoms. Cardiovascular The patient has no complaints or symptoms. Psychiatric The patient has no complaints or symptoms. Objective Constitutional Well-nourished and well-hydrated in no acute distress. Vitals Time Taken: 9:32 AM, Height: 69 in, Weight: 238 lbs, BMI: 35.1, Temperature: 98.0 F, Pulse: 92 bpm, Respiratory Rate: 16 breaths/min, Blood Pressure: 116/67 mmHg. Respiratory normal breathing without difficulty. Psychiatric this patient is able to make decisions and demonstrates good insight into disease process. Alert and Oriented x 3. pleasant and cooperative. General Notes: Patient's wound bed actually shows evidence of again [facility] noted on the left lateral lower extremity ulcer which is new secondary to trauma. This was sharply debrided away to remove the slough and necrotic tissue from the surface of the wound he tolerated this without complication other than mild pain. In regard to the toe ulcer  I mainly just remove callous from the surrounding area of the ulcer itself to prevent this from prematurely closing over. No subcutaneous tissue  was Djordjevic, Ansar T. ([id number]) removed at this point I still do probe down to bone. Integumentary (Hair, Skin) Wound #2 status is Open. Original cause of wound was Trauma. The wound is located on the Left Toe Great. The wound measures 0.3cm length x 0.3cm width x 0.2cm depth; 0.071cm^2 area and 0.014cm^3 volume. There is Fat Layer (Subcutaneous Tissue) Exposed exposed. There is no tunneling or undermining noted. There is a small amount of serous drainage noted. The wound margin is flat and intact. There is no granulation within the wound bed. There is a medium (34- 66%) amount of necrotic tissue within the wound bed including Adherent Slough. The periwound skin appearance exhibited: Callus. The periwound skin appearance did not exhibit: Crepitus, Excoriation, Induration, Rash, Scarring, Dry/Scaly, Maceration, Atrophie Blanche, Cyanosis, Ecchymosis, Hemosiderin Staining, Mottled, Pallor, Rubor, Erythema. Periwound temperature was noted as No Abnormality. The periwound has tenderness on palpation. Wound #4 status is Open. Original cause of wound was Trauma. The wound is located on the Left,Lateral Lower Leg. The wound measures 2cm length x 1.2cm width x 0.1cm depth; 1.885cm^2 area and 0.188cm^3 volume. There is Fat Layer (Subcutaneous Tissue) Exposed exposed. There is no tunneling or undermining noted. There is a medium amount of serosanguineous drainage noted. The wound margin is distinct with the outline attached to the wound base. There is large (67-100%) red, pink granulation within the wound bed. There is a small (1-33%) amount of necrotic tissue within the wound bed including Adherent Slough. The periwound skin appearance did not exhibit: Callus, Crepitus, Excoriation, Induration, Rash, Scarring, Dry/Scaly, Maceration, Atrophie Blanche, Cyanosis, Ecchymosis, Hemosiderin Staining, Mottled, Pallor, Rubor, Erythema. Periwound temperature was noted as No Abnormality. The periwound has  tenderness on palpation. 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 There was a Selective/Open Wound Non-Viable Tissue Debridement with a total area of 0.09 sq cm performed by STONE III, HOYT E., PA-C. With the following instrument(s): Curette to remove Non-Viable tissue/material. Material removed includes Callus after achieving pain control using Lidocaine 4% Topical Solution. No specimens were taken. A time out was conducted at 08:52, prior to the start of the procedure. There was no bleeding. The procedure was tolerated well with a pain level of 0 throughout and a pain level of 0 following the procedure. Post Debridement Measurements: 0.3cm length x 0.3cm width x 0.2cm depth; 0.014cm^3 volume. Character of Wound/Ulcer Post Debridement is improved. Severity of Tissue Post Debridement is: Fat layer exposed. Post procedure Diagnosis Wound #2: Same as Pre-Procedure Wound #4 Pre-procedure diagnosis of Wound #4 is a Trauma, Other located on the Left,Lateral Lower Leg . There was a Excisional Skin/Subcutaneous Tissue Debridement with a total area of 2.4 sq cm performed by STONE III, HOYT E., PA-C. With the [name], [name] ([id number]) following instrument(s): Curette to remove Viable and Non-Viable tissue/material. Material removed includes Subcutaneous Tissue and Slough and after achieving pain control using Lidocaine 4% Topical Solution. No specimens were taken. A time out was conducted at  08:55,  prior to the start of the procedure. A Minimum amount of bleeding was controlled with Pressure. The procedure was tolerated well with a pain level of 0 throughout and a pain level of 0 following the procedure. Post Debridement Measurements: 2cm length x 1.2cm width x 0.2cm depth; 0.377cm^3 volume. Character of Wound/Ulcer Post Debridement is improved. Post procedure Diagnosis Wound #4: Same as Pre-Procedure Plan Wound Cleansing: Wound #2 Left Toe Great: Clean wound with Normal Saline. Cleanse wound with mild soap and water Wound #4 Left,Lateral Lower Leg: Clean wound with Normal Saline. Cleanse wound with mild soap and water Anesthetic (add to Medication List): Wound #2 Left Toe Great: Topical Lidocaine 4% cream applied to wound bed prior to debridement (In Clinic Only). Wound #4 Left,Lateral Lower Leg: Topical Lidocaine 4% cream applied to wound bed prior to debridement (In Clinic Only). Primary Wound Dressing: Wound #2 Left Toe Great: Silver Alginate Wound #4 Left,Lateral Lower Leg: Silver Collagen Secondary Dressing: Wound #2 Left Toe Great: Gauze and Kerlix/Conform - secure with coban Foam Wound #4 Left,Lateral Lower Leg: Other - coverlet or bandaid Dressing Change Frequency: Wound #2 Left Toe Great: Change dressing every other day. Wound #4 Left,Lateral Lower Leg: Change dressing every other day. Follow-up Appointments: Wound #2 Left Toe Great: Return Appointment in 1 week. Wound #4 Left,Lateral Lower Leg: Return Appointment in 1 week. Off-Loading: Wound #2 Left Toe Great: Open toe surgical shoe with peg assist. Other: - keep pressure off of area Wound #4 Left,Lateral Lower Leg: Open toe surgical shoe with peg assist. Other: - keep pressure off of area Additional Orders / Instructions: Wound #2 Left Toe Great: Stop Smoking Vitamin A; Vitamin C, Zinc - Please add a multivitamin with 100% of these [name], Marcus T. ([id number]) Increase protein intake. Activity  as tolerated Wound #4 Left,Lateral Lower Leg: Stop Smoking Vitamin A; Vitamin C, Zinc - Please add a multivitamin with 100% of these Increase protein intake. Activity as tolerated At this point my suggestion is going to be that we continue with the above wound care measures for the next week. I'm gonna initiate again treatment with the silver collagen for the left lateral lower extremity ulcer which is new we will continue with the silver alginate packing for the toe. We will see were things stand in one week. Please see above for specific wound care orders. We will see patient for re-evaluation in 1 week(s) here in the clinic. If anything worsens or changes patient will contact our office for additional recommendations. Electronic Signature(s) Signed: 09/17/2018 5:36:35 PM By: Worthy Keeler PA-C Entered By: Worthy Keeler on 09/17/2018 09:08:42 [name], [name] ([id number]) -------------------------------------------------------------------------------- ROS/PFSH Details Patient Name: [name]. Date of Service: 09/17/2018 8:30 AM Medical Record Number: [id number] Patient Account Number: [id number] Date of Birth/Sex: 05/29/1951 (67 y.o. M) Treating RN: Montey Hora Primary Care Provider: BESS, KATY Other Clinician: Referring Provider: BESS, KATY Treating Provider/Extender: STONE III, HOYT Weeks in Treatment: 21 Information Obtained From Patient Wound History Do you currently have one or more open woundso Yes How many open wounds do you currently haveo 2 Approximately how long have you had your woundso 2 weeks How have you been treating your wound(s) until nowo yes Doctor Has your wound(s) ever healed and then re-openedo No Have you had any lab work done in the past montho No Have you tested positive for an antibiotic resistant organism (MRSA, VRE)o No Have you tested positive for osteomyelitis (bone infection)o No Have you had any tests  for circulation on your legso  No Have you had other problems associated with your woundso Infection, Swelling Constitutional Symptoms (General Health) Complaints and Symptoms: Negative for: Fever; Chills Eyes Medical History: Negative for: Cataracts; Glaucoma; Optic Neuritis Ear/Nose/Mouth/Throat Medical History: Negative for: Chronic sinus problems/congestion Hematologic/Lymphatic Medical History: Positive for: Lymphedema Negative for: Anemia; Hemophilia; Human Immunodeficiency Virus; Sickle Cell Disease Respiratory Complaints and Symptoms: No Complaints or Symptoms Medical History: Negative for: Aspiration; Asthma; Chronic Obstructive Pulmonary Disease (COPD); Pneumothorax; Sleep Apnea; Tuberculosis Cardiovascular Complaints and Symptoms: No Complaints or Symptoms Medical History: PRESTIN, MUNCH ([id number]) Positive for: Arrhythmia - pacemaker; Coronary Artery Disease; Hypertension; Myocardial Infarction Negative for: Deep Vein Thrombosis; Hypotension; Peripheral Arterial Disease; Peripheral Venous Disease; Phlebitis; Vasculitis Gastrointestinal Medical History: Negative for: Cirrhosis ; Colitis; Crohnos; Hepatitis A; Hepatitis B; Hepatitis C Endocrine Medical History: Positive for: Type II Diabetes Negative for: Type I Diabetes Time with diabetes: 5 years Treated with: Oral agents Blood sugar tested every day: Yes Tested : daily AM Blood sugar testing results: Breakfast: 174 Genitourinary Medical History: Negative for: End Stage Renal Disease Immunological Medical History: Negative for: Lupus Erythematosus; Raynaudos; Scleroderma Integumentary (Skin) Medical History: Negative for: History of Burn; History of pressure wounds Musculoskeletal Medical History: Negative for: Gout; Rheumatoid Arthritis; Osteoarthritis; Osteomyelitis Neurologic Medical History: Positive for: Neuropathy Negative for: Dementia; Quadriplegia; Paraplegia; Seizure Disorder Oncologic Medical History: Positive  for: Received Chemotherapy; Received Radiation - Seeds prostate Psychiatric Complaints and Symptoms: No Complaints or Symptoms Medical History: Negative for: Anorexia/bulimia; Confinement Anxiety [name], ISSAM CARLYON ([id number]) Immunizations Pneumococcal Vaccine: Received Pneumococcal Vaccination: Yes Tetanus Vaccine: Last tetanus shot: 12/25/2017 Implantable Devices Family and Social History Cancer: Yes - Father; Diabetes: Yes - Father; Heart Disease: Yes - Mother,Father; Hypertension: Yes - Mother; Kidney Disease: Yes - Child; Lung Disease: No; Seizures: No; Stroke: No; Thyroid Problems: No; Tuberculosis: No; Current every day smoker - 1 - 1/2 pack daily; Marital Status - Married; Alcohol Use: Rarely; Drug Use: No History; Caffeine Use: Daily - coffee; Financial Concerns: No; Food, Clothing or Shelter Needs: No; Support System Lacking: No; Transportation Concerns: No Physician Affirmation I have reviewed and agree with the above information. Electronic Signature(s) Signed: 09/17/2018 5:36:35 PM By: Worthy Keeler PA-C Signed: 09/18/2018 5:04:30 PM By: Montey Hora Entered By: Worthy Keeler on 09/17/2018 09:02:21 Mcdougle, [name] ([id number]) -------------------------------------------------------------------------------- SuperBill Details Patient Name: [name]. Date of Service: 09/17/2018 Medical Record Number: [id number] Patient Account Number: [id number] Date of Birth/Sex: [birth year] (67 y.o. M) Treating RN: Montey Hora Primary Care Provider: BESS, KATY Other Clinician: Referring Provider: BESS, KATY Treating Provider/Extender: STONE III, HOYT Weeks in Treatment: 21 Diagnosis Coding ICD-10 Codes Code Description I87.2 Venous insufficiency (chronic) (peripheral) E11.621 Type 2 diabetes mellitus with foot ulcer L97.526 Non-pressure chronic ulcer of other part of left foot with bone involvement without evidence of necrosis S81.802A Unspecified open wound, left  lower leg, initial encounter I63.9 Cerebral infarction, unspecified I10 Essential (primary) hypertension Facility Procedures CPT4: Description Modifier Quantity Code 70623762 11042 - DEB SUBQ TISSUE 20 SQ CM/< 1 ICD-10 Diagnosis Description S81.802A Unspecified open wound, left lower leg, initial encounter CPT4: 83151761 97597 - DEBRIDE WOUND 1ST 20 SQ CM OR < 1 ICD-10 Diagnosis Description L97.526 Non-pressure chronic ulcer of other part of left foot with bone involvement without evidence of necrosis Physician Procedures CPT4: Description Modifier Quantity Code [id number] 62694 - WC PHYS SUBQ TISS 20 SQ CM 1 ICD-10 Diagnosis Description S81.802A Unspecified open wound, left lower leg, initial encounter CPT4: 8546270 35009 -  WC PHYS DEBR WO ANESTH 20 SQ CM 1 ICD-10 Diagnosis Description L97.526 Non-pressure chronic ulcer of other part of left foot with bone involvement without evidence of necrosis Electronic Signature(s) Signed: 09/17/2018 5:36:35 PM By: Worthy Keeler PA-C Entered By: Worthy Keeler on 09/17/2018 09:03:44

## 2018-09-19 NOTE — Progress Notes (Signed)
LEWIS, KEATS ([id number]) Visit Report for 09/17/2018 Arrival Information Details Patient Name: DONNAVIN, VANDENBRINK. Date of Service: 09/17/2018 8:30 AM Medical Record Number: [id number] Patient Account Number: [id number] Date of Birth/Sex: Jun 06, 1951 (67 y.o. M) Treating RN: Montey Hora Primary Care Camrynn Mcclintic: BESS, KATY Other Clinician: Referring Yogesh Cominsky: BESS, KATY Treating Shawna Wearing/Extender: Melburn Hake, HOYT Weeks in Treatment: 21 Visit Information History Since Last Visit Added or deleted any medications: Yes Patient Arrived: Ambulatory Any new allergies or adverse reactions: No Arrival Time: 08:26 Had a fall or experienced change in No Accompanied By: self activities of daily living that may affect Transfer Assistance: None risk of falls: Patient Identification Verified: Yes Signs or symptoms of abuse/neglect since last visito No Secondary Verification Process Completed: Yes Hospitalized since last visit: No Patient Requires Transmission-Based No Implantable device outside of the clinic excluding Yes Precautions: cellular tissue based products placed in the center Patient Has Alerts: Yes since last visit: Patient Alerts: Type II Has Dressing in Place as Prescribed: Yes Diabetic Pain Present Now: Yes Electronic Signature(s) Signed: 09/17/2018 4:26:07 PM By: Lorine Bears RCP, RRT, CHT Entered By: Becky Sax, Amado Nash on 09/17/2018 08:29:09 Funez, Huntley Dec ([id number]) -------------------------------------------------------------------------------- Complex / Palliative Patient Assessment Details Patient Name: [name] Date of Service: 09/17/2018 8:30 AM Medical Record Number: [id number] Patient Account Number: [id number] Date of Birth/Sex: 04/02/1951 (67 y.o. M) Treating RN: Montey Hora Primary Care Areil Ottey: BESS, Valetta Fuller Other Clinician: Referring Tayley Mudrick: BESS, KATY Treating Royston Bekele/Extender: STONE III, HOYT Weeks in Treatment:  21 Palliative Management Criteria Complex Wound Management Criteria Patient has remarkable or complex co-morbidities requiring medications or treatments that extend wound healing times. Examples: o Diabetes mellitus with chronic renal failure or end stage renal disease requiring dialysis o Advanced or poorly controlled rheumatoid arthritis o Diabetes mellitus and end stage chronic obstructive pulmonary disease o Active cancer with current chemo- or radiation therapy noncompliant with offloading Care Approach Wound Care Plan: Complex Wound Management Electronic Signature(s) Signed: 09/17/2018 5:36:35 PM By: Worthy Keeler PA-C Signed: 09/18/2018 5:04:30 PM By: Montey Hora Entered By: Montey Hora on 09/17/2018 09:08:16 Whitelock, Huntley Dec ([id number]) -------------------------------------------------------------------------------- Encounter Discharge Information Details Patient Name: [name] Date of Service: 09/17/2018 8:30 AM Medical Record Number: [id number] Patient Account Number: [id number] Date of Birth/Sex: 02-Aug-1951 (67 y.o. M) Treating RN: Montey Hora Primary Care Vi Biddinger: BESS, KATY Other Clinician: Referring Marcina Kinnison: BESS, KATY Treating Jinger Middlesworth/Extender: Melburn Hake, HOYT Weeks in Treatment: 21 Encounter Discharge Information Items Discharge Condition: Stable Ambulatory Status: Ambulatory Discharge Destination: Home Transportation: Private Auto Accompanied By: self Schedule Follow-up Appointment: Yes Clinical Summary of Care: Post Procedure Vitals: Temperature (F): 98.0 Pulse (bpm): 92 Respiratory Rate (breaths/min): 18 Blood Pressure (mmHg): 116/67 Electronic Signature(s) Signed: 09/18/2018 5:04:30 PM By: Montey Hora Entered By: Montey Hora on 09/17/2018 09:07:21 Beechy, Huntley Dec ([id number]) -------------------------------------------------------------------------------- Lower Extremity Assessment Details Patient Name: [name] Date of Service: 09/17/2018 8:30 AM Medical Record Number: [id number] Patient Account Number: [id number] Date of Birth/Sex: [birth year] (67 y.o. M) Treating RN: Roger Shelter Primary Care Dollene Mallery: BESS, KATY Other Clinician: Referring Esaiah Wanless: BESS, KATY Treating Derell Bruun/Extender: STONE III, HOYT Weeks in Treatment: 21 Edema Assessment Assessed: [Left: No] [Right: No] Edema: [Left: N] [Right: o] Calf Left: Right: Point of Measurement: 36 cm From Medial Instep 37 cm cm Ankle Left: Right: Point of Measurement: 11 cm From Medial Instep 24 cm cm Vascular Assessment Claudication: Claudication Assessment [Left:None] Pulses: Dorsalis Pedis Palpable: [Left:Yes] Doppler Audible: [Left:Yes] Posterior Tibial Extremity colors, hair growth, and conditions:  Extremity Color: [Left:Hyperpigmented] Hair Growth on Extremity: [Left:Yes] Temperature of Extremity: [Left:Warm] Capillary Refill: [Left:< 3 seconds] Toe Nail Assessment Left: Right: Thick: Yes Discolored: Yes Deformed: No Improper Length and Hygiene: No Electronic Signature(s) Signed: 09/17/2018 10:33:34 AM By: Roger Shelter Entered By: Roger Shelter on 09/17/2018 08:45:52 Fugate, Huntley Dec ([id number]) -------------------------------------------------------------------------------- Multi Wound Chart Details Patient Name: [name] Date of Service: 09/17/2018 8:30 AM Medical Record Number: [id number] Patient Account Number: [id number] Date of Birth/Sex: May 08, 1951 (67 y.o. M) Treating RN: Montey Hora Primary Care Jeanclaude Wentworth: BESS, KATY Other Clinician: Referring Hazelyn Kallen: BESS, KATY Treating Charlesia Canaday/Extender: STONE III, HOYT Weeks in Treatment: 21 Vital Signs Height(in): 16 Pulse(bpm): 92 Weight(lbs): 238 Blood Pressure(mmHg): 116/67 Body Mass Index(BMI): 35 Temperature(F): 98.0 Respiratory Rate 16 (breaths/min): Photos: [2:No Photos] [4:No Photos] [N/A:N/A] Wound Location: [2:Left Toe  Great] [4:Left Lower Leg - Lateral] [N/A:N/A] Wounding Event: [2:Trauma] [4:Trauma] [N/A:N/A] Primary Etiology: [2:Diabetic Wound/Ulcer of the Lower Extremity] [4:Trauma, Other] [N/A:N/A] Secondary Etiology: [2:Trauma, Other] [4:N/A] [N/A:N/A] Comorbid History: [2:Lymphedema, Arrhythmia, Coronary Artery Disease, Hypertension, Myocardial Infarction, Type II Diabetes, Neuropathy, Received Chemotherapy, Received Radiation] [4:Lymphedema, Arrhythmia, Coronary Artery Disease, Hypertension,  Myocardial Infarction, Type II Diabetes, Neuropathy, Received Chemotherapy, Received Radiation] [N/A:N/A] Date Acquired: [2:12/25/2017] [4:09/07/2018] [N/A:N/A] Weeks of Treatment: [2:21] [4:0] [N/A:N/A] Wound Status: [2:Open] [4:Open] [N/A:N/A] Pending Amputation on [2:Yes] [4:No] [N/A:N/A] Presentation: Measurements L x W x D [2:0.3x0.3x0.2] [4:2x1.2x0.1] [N/A:N/A] (cm) Area (cm) : [2:0.071] [4:1.885] [N/A:N/A] Volume (cm) : [2:0.014] [4:0.188] [N/A:N/A] % Reduction in Area: [2:-51.10%] [4:N/A] [N/A:N/A] % Reduction in Volume: [2:-180.00%] [4:N/A] [N/A:N/A] Classification: [2:Grade 1] [4:Full Thickness Without Exposed Support Structures] [N/A:N/A] Exudate Amount: [2:Small] [4:Medium] [N/A:N/A] Exudate Type: [2:Serous] [4:Serosanguineous] [N/A:N/A] Exudate Color: [2:amber] [4:red, brown] [N/A:N/A] Wound Margin: [2:Flat and Intact] [4:Distinct, outline attached] [N/A:N/A] Granulation Amount: [2:None Present (0%)] [4:Large (67-100%)] [N/A:N/A] Granulation Quality: [2:N/A] [4:Red, Pink] [N/A:N/A] Necrotic Amount: [2:Medium (34-66%)] [4:Small (1-33%)] [N/A:N/A] Exposed Structures: [2:Fat Layer (Subcutaneous Tissue) Exposed: Yes Fascia: No Tendon: No] [4:Fat Layer (Subcutaneous Tissue) Exposed: Yes Fascia: No Tendon: No] [N/A:N/A] Muscle: No Muscle: No Joint: No Joint: No Bone: No Bone: No Epithelialization: Medium (34-66%) None N/A Periwound Skin Texture: Callus: Yes Excoriation: No N/A Excoriation:  No Induration: No Induration: No Callus: No Crepitus: No Crepitus: No Rash: No Rash: No Scarring: No Scarring: No Periwound Skin Moisture: Maceration: No Maceration: No N/A Dry/Scaly: No Dry/Scaly: No Periwound Skin Color: Atrophie Blanche: No Atrophie Blanche: No N/A Cyanosis: No Cyanosis: No Ecchymosis: No Ecchymosis: No Erythema: No Erythema: No Hemosiderin Staining: No Hemosiderin Staining: No Mottled: No Mottled: No Pallor: No Pallor: No Rubor: No Rubor: No Temperature: No Abnormality No Abnormality N/A Tenderness on Palpation: Yes Yes N/A Wound Preparation: Ulcer Cleansing: Ulcer Cleansing: N/A Rinsed/Irrigated with Saline Rinsed/Irrigated with Saline Topical Anesthetic Applied: Topical Anesthetic Applied: Other: lidocaine 4% Other: lidocaine 4% Treatment Notes Electronic Signature(s) Signed: 09/18/2018 5:04:30 PM By: Montey Hora Entered By: Montey Hora on 09/17/2018 08:50:22 Troyer, Huntley Dec ([id number]) -------------------------------------------------------------------------------- [place] Details Patient Name: [name] Date of Service: 09/17/2018 8:30 AM Medical Record Number: [id number] Patient Account Number: [id number] Date of Birth/Sex: 1951-10-21 (67 y.o. M) Treating RN: Montey Hora Primary Care Olamide Carattini: BESS, KATY Other Clinician: Referring Dencil Cayson: BESS, KATY Treating Morgyn Marut/Extender: STONE III, HOYT Weeks in Treatment: 21 Active Inactive ` Abuse / Safety / Falls / Self Care Management Nursing Diagnoses: Potential for falls Goals: Patient will remain injury free related to falls Date Initiated: 04/19/2018 Target Resolution Date: 05/04/2018 Goal Status: Active Interventions: Assess fall risk on admission and as needed  Notes: ` Nutrition Nursing Diagnoses: Impaired glucose control: actual or potential Goals: Patient/caregiver agrees to and verbalizes understanding of need to use nutritional  supplements and/or vitamins as prescribed Date Initiated: 04/19/2018 Target Resolution Date: 06/07/2018 Goal Status: Active Patient/caregiver verbalizes understanding of need to maintain therapeutic glucose control per primary care physician Date Initiated: 04/19/2018 Target Resolution Date: 05/31/2018 Goal Status: Active Interventions: Assess HgA1c results as ordered upon admission and as needed Assess patient nutrition upon admission and as needed per policy Notes: ` Orientation to the Wound Care Program Nursing Diagnoses: Knowledge deficit related to the wound healing center program Goals: Patient/caregiver will verbalize understanding of the [place] EDDISON, SEARLS ([id number]) Date Initiated: 04/19/2018 Target Resolution Date: 06/29/2018 Goal Status: Active Interventions: Provide education on orientation to the wound center Notes: ` Wound/Skin Impairment Nursing Diagnoses: Impaired tissue integrity Goals: Ulcer/skin breakdown will heal within 14 weeks Date Initiated: 04/19/2018 Target Resolution Date: 06/29/2018 Goal Status: Active Interventions: Assess patient/caregiver ability to obtain necessary supplies Assess patient/caregiver ability to perform ulcer/skin care regimen upon admission and as needed Assess ulceration(s) every visit Notes: Electronic Signature(s) Signed: 09/18/2018 5:04:30 PM By: Montey Hora Entered By: Montey Hora on 09/17/2018 08:50:16 Nierman, Huntley Dec ([id number]) -------------------------------------------------------------------------------- Pain Assessment Details Patient Name: [name] Date of Service: 09/17/2018 8:30 AM Medical Record Number: [id number] Patient Account Number: [id number] Date of Birth/Sex: 1951-11-24 (67 y.o. M) Treating RN: Montey Hora Primary Care Suri Tafolla: BESS, Valetta Fuller Other Clinician: Referring Tonnya Garbett: BESS, KATY Treating Alexandre Faries/Extender: STONE III, HOYT Weeks in Treatment: 21 Active  Problems Location of Pain Severity and Description of Pain Patient Has Paino Yes Site Locations Rate the pain. Current Pain Level: 3 Character of Pain Describe the Pain: Sharp Pain Management and Medication Current Pain Management: Electronic Signature(s) Signed: 09/17/2018 4:26:07 PM By: Lorine Bears RCP, RRT, CHT Signed: 09/18/2018 5:04:30 PM By: Montey Hora Entered By: Lorine Bears on 09/17/2018 08:29:43 Catala, Huntley Dec ([id number]) -------------------------------------------------------------------------------- Patient/Caregiver Education Details Patient Name: [name] Date of Service: 09/17/2018 8:30 AM Medical Record Number: [id number] Patient Account Number: [id number] Date of Birth/Gender: 1951/09/25 (67 y.o. M) Treating RN: Montey Hora Primary Care Physician: BESS, KATY Other Clinician: Referring Physician: Lillard Anes Treating Physician/Extender: Worthy Keeler Weeks in Treatment: 21 Education Assessment Education Provided To: Patient Education Topics Provided Wound/Skin Impairment: Handouts: Other: wound care as ordered Methods: Demonstration, Explain/Verbal Responses: State content correctly Electronic Signature(s) Signed: 09/18/2018 5:04:30 PM By: Montey Hora Entered By: Montey Hora on 09/17/2018 09:07:40 Mccluskey, Huntley Dec ([id number]) -------------------------------------------------------------------------------- Wound Assessment Details Patient Name: [name] Date of Service: 09/17/2018 8:30 AM Medical Record Number: [id number] Patient Account Number: [id number] Date of Birth/Sex: [birth year] (67 y.o. M) Treating RN: Roger Shelter Primary Care Charese Abundis: BESS, KATY Other Clinician: Referring Brinley Treanor: BESS, KATY Treating Kenlie Seki/Extender: STONE III, HOYT Weeks in Treatment: 21 Wound Status Wound Number: 2 Primary Diabetic Wound/Ulcer of the Lower Extremity Etiology: Wound Location: Left Toe  Great Secondary Trauma, Other Wounding Event: Trauma Etiology: Date Acquired: 12/25/2017 Wound Open Weeks Of Treatment: 21 Status: Clustered Wound: No Comorbid Lymphedema, Arrhythmia, Coronary Artery Pending Amputation On Presentation History: Disease, Hypertension, Myocardial Infarction, Type II Diabetes, Neuropathy, Received Chemotherapy, Received Radiation Photos Photo Uploaded By: Roger Shelter on 09/17/2018 13:56:14 Wound Measurements Length: (cm) 0.3 Width: (cm) 0.3 Depth: (cm) 0.2 Area: (cm) 0.071 Volume: (cm) 0.014 % Reduction in Area: -51.1% % Reduction in Volume: -180% Epithelialization: Medium (34-66%) Tunneling: No Undermining: No Wound Description Classification: Grade 1 Wound Margin: Flat and Intact  Exudate Amount: Small Exudate Type: Serous Exudate Color: amber Foul Odor After Cleansing: No Slough/Fibrino Yes Wound Bed Granulation Amount: None Present (0%) Exposed Structure Necrotic Amount: Medium (34-66%) Fascia Exposed: No Necrotic Quality: Adherent Slough Fat Layer (Subcutaneous Tissue) Exposed: Yes Tendon Exposed: No Muscle Exposed: No Joint Exposed: No Brink, Huntley Dec ([id number]) Bone Exposed: No Periwound Skin Texture Texture Color No Abnormalities Noted: No No Abnormalities Noted: No Callus: Yes Atrophie Blanche: No Crepitus: No Cyanosis: No Excoriation: No Ecchymosis: No Induration: No Erythema: No Rash: No Hemosiderin Staining: No Scarring: No Mottled: No Pallor: No Moisture Rubor: No No Abnormalities Noted: No Dry / Scaly: No Temperature / Pain Maceration: No Temperature: No Abnormality Tenderness on Palpation: Yes Wound Preparation Ulcer Cleansing: Rinsed/Irrigated with Saline Topical Anesthetic Applied: Other: lidocaine 4%, Treatment Notes Wound #2 (Left Toe Great) 1. Cleansed with: Clean wound with Normal Saline 2. Anesthetic Topical Lidocaine 4% cream to wound bed prior to debridement 4. Dressing  Applied: Calcium Alginate with Silver 5. Secondary Dressing Applied Foam Kerlix/Conform 7. Secured with Tape Notes SIlvercell, foam, secured with conform and tape. Electronic Signature(s) Signed: 09/17/2018 10:33:34 AM By: Roger Shelter Entered By: Roger Shelter on 09/17/2018 08:44:36 Hudspeth, Huntley Dec ([id number]) -------------------------------------------------------------------------------- Wound Assessment Details Patient Name: [name] Date of Service: 09/17/2018 8:30 AM Medical Record Number: [id number] Patient Account Number: [id number] Date of Birth/Sex: [birth year] (67 y.o. M) Treating RN: Roger Shelter Primary Care Kennidi Yoshida: BESS, KATY Other Clinician: Referring Brandee Markin: BESS, KATY Treating Lailany Enoch/Extender: STONE III, HOYT Weeks in Treatment: 21 Wound Status Wound Number: 4 Primary Trauma, Other Etiology: Wound Location: Left Lower Leg - Lateral Wound Open Wounding Event: Trauma Status: Date Acquired: 09/07/2018 Comorbid Lymphedema, Arrhythmia, Coronary Artery Weeks Of Treatment: 0 History: Disease, Hypertension, Myocardial Infarction, Clustered Wound: No Type II Diabetes, Neuropathy, Received Chemotherapy, Received Radiation Photos Photo Uploaded By: Roger Shelter on 09/17/2018 13:56:15 Wound Measurements Length: (cm) 2 Width: (cm) 1.2 Depth: (cm) 0.1 Area: (cm) 1.885 Volume: (cm) 0.188 % Reduction in Area: % Reduction in Volume: Epithelialization: None Tunneling: No Undermining: No Wound Description Full Thickness Without Exposed Support Classification: Structures Wound Margin: Distinct, outline attached Exudate Medium Amount: Exudate Type: Serosanguineous Exudate Color: red, brown Foul Odor After Cleansing: No Slough/Fibrino Yes Wound Bed Granulation Amount: Large (67-100%) Exposed Structure Granulation Quality: Red, Pink Fascia Exposed: No Necrotic Amount: Small (1-33%) Fat Layer (Subcutaneous Tissue) Exposed:  Yes Necrotic Quality: Adherent Slough Tendon Exposed: No Muscle Exposed: No Joint Exposed: No Magri, Huntley Dec ([id number]) Bone Exposed: No Periwound Skin Texture Texture Color No Abnormalities Noted: No No Abnormalities Noted: No Callus: No Atrophie Blanche: No Crepitus: No Cyanosis: No Excoriation: No Ecchymosis: No Induration: No Erythema: No Rash: No Hemosiderin Staining: No Scarring: No Mottled: No Pallor: No Moisture Rubor: No No Abnormalities Noted: No Dry / Scaly: No Temperature / Pain Maceration: No Temperature: No Abnormality Tenderness on Palpation: Yes Wound Preparation Ulcer Cleansing: Rinsed/Irrigated with Saline Topical Anesthetic Applied: Other: lidocaine 4%, Treatment Notes Wound #4 (Left, Lateral Lower Leg) 1. Cleansed with: Clean wound with Normal Saline 2. Anesthetic Topical Lidocaine 4% cream to wound bed prior to debridement 4. Dressing Applied: Prisma Ag Other dressing (specify in notes) Notes coverlet Electronic Signature(s) Signed: 09/17/2018 10:33:34 AM By: Roger Shelter Entered By: Roger Shelter on 09/17/2018 08:43:45 Streety, Huntley Dec ([id number]) -------------------------------------------------------------------------------- Vitals Details Patient Name: [name] Date of Service: 09/17/2018 8:30 AM Medical Record Number: [id number] Patient Account Number: [id number] Date of Birth/Sex: 1951-06-15 (67 y.o. M) Treating RN: Montey Hora Primary  Care Danira Nylander: BESS, KATY Other Clinician: Referring Uniqua Kihn: BESS, KATY Treating Adreyan Carbajal/Extender: STONE III, HOYT Weeks in Treatment: 21 Vital Signs Time Taken: 09:32 Temperature (F): 98.0 Height (in): 69 Pulse (bpm): 92 Weight (lbs): 238 Respiratory Rate (breaths/min): 16 Body Mass Index (BMI): 35.1 Blood Pressure (mmHg): 116/67 Reference Range: 80 - 120 mg / dl Electronic Signature(s) Signed: 09/17/2018 4:26:07 PM By: Lorine Bears RCP, RRT,  CHT Entered By: Lorine Bears on 09/17/2018 08:34:17

## 2018-09-24 ENCOUNTER — Encounter: Payer: PPO | Attending: Physician Assistant | Admitting: Physician Assistant

## 2018-09-24 DIAGNOSIS — E1140 Type 2 diabetes mellitus with diabetic neuropathy, unspecified: Secondary | ICD-10-CM | POA: Insufficient documentation

## 2018-09-24 DIAGNOSIS — E11621 Type 2 diabetes mellitus with foot ulcer: Secondary | ICD-10-CM | POA: Insufficient documentation

## 2018-09-24 DIAGNOSIS — I872 Venous insufficiency (chronic) (peripheral): Secondary | ICD-10-CM | POA: Insufficient documentation

## 2018-09-24 DIAGNOSIS — I10 Essential (primary) hypertension: Secondary | ICD-10-CM | POA: Diagnosis not present

## 2018-09-24 DIAGNOSIS — R310 Gross hematuria: Secondary | ICD-10-CM | POA: Diagnosis not present

## 2018-09-24 DIAGNOSIS — L97526 Non-pressure chronic ulcer of other part of left foot with bone involvement without evidence of necrosis: Secondary | ICD-10-CM | POA: Insufficient documentation

## 2018-09-24 DIAGNOSIS — Z950 Presence of cardiac pacemaker: Secondary | ICD-10-CM | POA: Diagnosis not present

## 2018-09-24 DIAGNOSIS — L97522 Non-pressure chronic ulcer of other part of left foot with fat layer exposed: Secondary | ICD-10-CM | POA: Diagnosis not present

## 2018-09-24 DIAGNOSIS — N200 Calculus of kidney: Secondary | ICD-10-CM | POA: Diagnosis not present

## 2018-09-25 ENCOUNTER — Ambulatory Visit (INDEPENDENT_AMBULATORY_CARE_PROVIDER_SITE_OTHER): Payer: PPO | Admitting: *Deleted

## 2018-09-25 DIAGNOSIS — I63411 Cerebral infarction due to embolism of right middle cerebral artery: Secondary | ICD-10-CM | POA: Diagnosis not present

## 2018-09-25 NOTE — Progress Notes (Signed)
ARJUN, HARD ([id number]) Visit Report for 09/24/2018 Chief Complaint Document Details Patient Name: [name], [name]. Date of Service: 09/24/2018 8:15 AM Medical Record Number: [id number] Patient Account Number: [id number] Date of Birth/Sex: [birth year] (67 y.o. M) Treating RN: Montey Hora Primary Care Provider: BESS, Valetta Fuller Other Clinician: Referring Provider: BESS, KATY Treating Provider/Extender: STONE III, HOYT Weeks in Treatment: 22 Information Obtained from: Patient Chief Complaint Left lower leg and left great toe ulcer Electronic Signature(s) Signed: 09/24/2018 9:56:08 AM By: Worthy Keeler PA-C Entered By: Worthy Keeler on 09/24/2018 08:21:34 Salvato, [name] ([id number]) -------------------------------------------------------------------------------- Debridement Details Patient Name: [name]. Date of Service: 09/24/2018 8:15 AM Medical Record Number: [id number] Patient Account Number: [id number] Date of Birth/Sex: Oct 08, 1951 (67 y.o. M) Treating RN: Montey Hora Primary Care Provider: BESS, KATY Other Clinician: Referring Provider: BESS, KATY Treating Provider/Extender: STONE III, HOYT Weeks in Treatment: 22 Debridement Performed for Wound #2 Left Toe Great Assessment: Performed By: Physician STONE III, HOYT E., PA-C Debridement Type: Debridement Severity of Tissue Pre Fat layer exposed Debridement: Level of Consciousness (Pre- Awake and Alert procedure): Pre-procedure Verification/Time Yes - 08:39 Out Taken: Start Time: 08:39 Pain Control: Lidocaine 4% Topical Solution Total Area Debrided (L x W): 0.1 (cm) x 0.1 (cm) = 0.01 (cm) Tissue and other material Viable, Non-Viable, Callus, Slough, Subcutaneous, Slough debrided: Level: Skin/Subcutaneous Tissue Debridement Description: Excisional Instrument: Curette Bleeding: Minimum Hemostasis Achieved: Pressure End Time: 08:44 Procedural Pain: 0 Post Procedural Pain: 0 Response to Treatment:  Procedure was tolerated well Level of Consciousness Awake and Alert (Post-procedure): Post Debridement Measurements of Total Wound Length: (cm) 0.2 Width: (cm) 0.2 Depth: (cm) 0.4 Volume: (cm) 0.013 Character of Wound/Ulcer Post Debridement: Improved Severity of Tissue Post Debridement: Fat layer exposed Post Procedure Diagnosis Same as Pre-procedure Electronic Signature(s) Signed: 09/24/2018 9:56:08 AM By: Worthy Keeler PA-C Signed: 09/24/2018 5:00:45 PM By: Montey Hora Entered By: Montey Hora on 09/24/2018 08:44:50 Buford, [name] ([id number]) -------------------------------------------------------------------------------- HPI Details Patient Name: Marcus Loll T. Date of Service: 09/24/2018 8:15 AM Medical Record Number: [id number] Patient Account Number: [id number] Date of Birth/Sex: [birth year] (67 y.o. M) Treating RN: Montey Hora Primary Care Provider: BESS, KATY Other Clinician: Referring Provider: BESS, KATY Treating Provider/Extender: STONE III, HOYT Weeks in Treatment: 22 History of Present Illness Associated Signs and Symptoms: Patient has a history of chronic venous insufficiency, diabetes mellitus type II, its reform fraction, hypertension HPI Description: 04/19/18 on evaluation today patient presents initially concerning an ulcer of his left great toe and left anterior shin. He states that the shin was definitely a trauma injury where he struck this on a metal pole injuring leg. In regard to the distal left great toe he's really not sure what happened here although he does have a significant callous noted as well. He does have a pacemaker. Fortunately his ABI was 0.98 on the right and 0.83 on the left this appears to be doing fairly well. Overall I'm pleased with that. Nonetheless he states that this really has not seem to be improving. He has been tolerating the dressing changes without complication. The left anterior leg ulcer began on 04/05/18 according  to what he tells [place] today although one note I did have for review said it was April 5. I'm inclined to believe it was the fifth she did have an office visit on 04/01/18 with his family nurse practitioner Faith Rogue. Nonetheless either way it's been going on this month for several weeks. The left great toe has actually been present  since January 1 he does not know how this occurred. Fortunately has no pain at the site although he does have pain on the left anterior shin. His hemoglobin A1c is 8.0 in March 2019. 04/26/18 on evaluation today patient presents for follow-up concerning his life into your lower [place] also as well as his left great toe also. Fortunately the shin actually appears to be doing better on evaluation today in my opinion I'm not seeing as much in the way of fluffernutter on the surface although it does still require debridement today this is improved. Size wise it's really not much different. With that being said the total ulcer actually appears to be a little bit more macerated today I'm not sure why they state that several days ago when this was changed that was not the case. Fortunately is not having severe pain although both areas do hurt the shin is worse. 05/03/18 on evaluation today patient appears to be doing better in regard to his left first toe and left shin ulcers. He is been tolerating the dressing changes without complication and the good news is this seems to be showing signs of getting better. Overall I'm very pleased with the progress that has been made over the past week. Patient is still having pain especially in regard to the left shin. 05/10/18 on evaluation today patient appears to be doing rather well in regard to his two ulcers. The toe ulcer appears to be smaller he does have some undermining however in this had to be cleaned out just a little bit. With that being said overall I feel like this is showing signs of improvement. The left anterior shin ulcer  also showing signs of improvement at this time. There does not appear to be any evidence of infection which is good news. 05/21/18 on evaluation today patient appears to be doing very well in regard to his left shin ulcer. He has been tolerating the dressing changes without complication. With that being said he is having issues at this point with his great toe on the left. We have not x-rayed that at this point I think we may need to. Nonetheless he has pain really at the roughly 5 o'clock location but nowhere else and I really cannot explain this I do not see any obvious form body. Nonetheless he continues to have issues with getting this area to close it's not progressing as nicely. 05/28/18 on evaluation today patient appears to be doing very well in regard to his left lower extremity anterior ulcer. He has been tolerating the dressing changes without complication. Overall this is making excellent progress with the collagen dressing. His left great toe x-ray did return and showed that he had no if you that normality. He did have a plantar heel spur but this is more degenerative. Otherwise just arthritis was noted no osteomyelitis and no obvious form body was visualized. Overall things seem to be going fairly well. With that being said he still has pain when looking at the toe from the plantar aspect at roughly the six back to the 4 o'clock location which is not quite as bad as last time but still hurts more than any other part of his wound. It does appear the Iodoflex has been of benefit however. 06/04/18 on evaluation today patient appears to be doing excellent in regard to his left anterior shin ulcer. This has made excellent progress and is showing signs of improving in fact very close to completely closing in healing. Nonetheless unfortunately  his left great toe has not faired quite as well as far as progress is concerned. He notes that after last week's [place], YUVAL RUBENS. ([id number]) debridement  he actually seem to be doing a little bit better for a couple of days until Thursday of last week when he had increases in discomfort as well is changes in the draining. He states that the drainage has been more green in color according to his wife and what he has seen. He's also had more discomfort. He relates to me again that he really feels like this all started when he stepped on a nail although he does not exactly remember it it seemed to be a puncture wound that he feels may be the culprit for why all this started. Nonetheless I am getting more concerned about the possibility of osteomyelitis despite the fact that the x-ray was negative I was hopeful in this interim since last week to this week that we would see some improvement in the overall appearance of the toe. Unfortunately if anything I feel like it's a little worse. It was macerated but I think we can attribute this to the fact that the patient did have an episode last night we got stuck in the rain and he thinks the dressing got wet. He did not change it I told him in the future if this were to happen to please go ahead and change the dressing it won't hurt to change it more frequently in that situation. Otherwise other than the maceration I do not see any evidence of anything being any worse but it also does not appear to be any better. 06/11/18 on evaluation today patient's wounds both the great toe as well as the left shin both on the left side show signs of good improvement. Fortunately the patient's culture came back negative in regard to the toe and there does not appear to be any evidence of infection. His MRI is actually scheduled for 06/19/18. Fortunately this is right before I see him next week and then we can go to the results of the culture at that point. Patient is in agreement with that plan. With that being said otherwise he has been showing signs of having less pain in regard to the toe which is also good news my hope is  that he does not have osteomyelitis. 06/18/18 on evaluation today patient appears to be doing very well in regard to his left lower extremity ulcer. He also states at this point in time that he has been tolerating the dressing changes with the toe it appears also to be doing very well at this point. In general I'm happy with the progress that he seems to be making. With that being said he still continues to have pain in regard to the great toe ulcer fortunately this does not seem to be as significant as it was in the beginning but still nonetheless he is having some pain. No fevers, chills, nausea, or vomiting noted at this time. 06/25/18 on evaluation today patient actually appears to be doing very well in regard to his left anterior shin ulcer. He's been tolerating the dressing changes without complication in this seems to have done extremely well. In fact this appears to be healed. With that being said his toe ulcer still continues to get in trouble fortunately not as much as has been in the past and there have been some signs of improvement and better granulation although I still think he's getting a lot of  callous to the area which seems to indicate a lot of pressure and friction. I believe he may benefit from a total contact cast especially now that the left shin ulcer has healed. 07/02/18 on evaluation today patient appears to be doing rather well in regard to his left anterior shin ulcer which is actually completely healed at this point. With that being said he has left great toe ulcer actually appears to be doing better he states over the weekend he actually had a significant amount of drainage from the toe after it had been hurting for some time. He states when his wife change the dressing there was a lot of discharge and fluid he actually took a picture he showed me today. Since that time he has not noted as much tenderness he feels like something may have "popped out" 07/09/18 on evaluation  today patient appears to be doing about the same in regard to his toe ulcer. He really has not shown any signs of significant improvement this seems to just be maintaining at best. He continues to be extremely active in fact I think this may be the biggest issue that he continues to do things as normal in fact right now he's remodeling the bathroom. Obviously I think he is a very active individual and as previously discussed I think a total contact cast would be of great benefit for him. This was discussed with patient yet again today I think we are gonna proceed in this regard. 07/12/18 patient presents today for his first total contact cast change. He has had this on for three days and states that though he felt like it may be rubbing a little bit around his ankles there does not appear to be any injury at this point. Overall the wound actually looks better in my pinion. 07/16/18 on evaluation today patient's wound actually appears to be doing much better at this point. He has been tolerating the dressing changes without complication. With that being said I do believe the total contact cast has been of benefit he states he is having much less discomfort that he previously had. Overall I'm very pleased with how things have progressed in just one weeks worth of the total contact cast. 07/23/18 on evaluation today patient's toe ulcer on the left great toe actually appears to be showing signs of improvement which is good news. In general I'm very pleased with the progress that he has made in the cast over the past two weeks. Fortunately there does not appear to be any evidence of infection at this time. He also is not having as much maceration as was previously noted. The patient is happy in this regard that he notes that the boot and cast is not the most comfortable thing in the world. 07/30/18-He is here in follow-up evaluation for left great toe ulcer. He presents in a total contact cast, after removal  he is noted to be macerated with new injury to the dorsum of his left foot; he continues to work outside and this is most likely secondary to [name], Marcus T. ([id number]) excessive sweating versus wet from a shower. We will hold off on reapplying total contact cast, allowing the dorsal foot injury to resolve and allowing for complete drying as he will continue to work outside. He will have a nurse visit next Monday to evaluate dorsal foot partial thickness ulcer and moisture/maceration to foot and follow-up in 2 weeks 08/13/18 on evaluation today patient's toe ulcer actually appears to be doing  about the same. Fortunately the dorsal foot ulcer has healed and his foot is not appearing to be as macerated. He continues to be overly active in my pinion in regard to the remodeling of his home he states he was actually finishing up some plumbing yesterday. Nonetheless I think that right now being outside the majority the day in a total contact cast in roughly 90 to 100o weather has been counterproductive not helpful for them in regard to healing the wound. I explained to him that if we're gonna get this to heal he may need to drop back a bit and take it easy to allow this to heal. He states that's not something is gonna likely be able to do. 08/20/18 on evaluation today patient actually appears to be doing a little better in regard to his toe ulcer. He's been tolerating the dressing changes much better he did not tolerate the front offloading shoe however. He states it actually caused his left knee to hurt that something that is been told before he probably needs replacement nonetheless he wasn't able to where the shoe past Friday so really only for about three days or so. Nonetheless we have not tried a PEG assist offloading shoe. I think this may be something that we could try for him. In general I do feel like the wound size is slightly smaller in particular in regard to the area of undermining. 08/27/18  on evaluation today patient actually appears to be doing possibly a bit better in regard to his left great toe ulcer. He's been tolerating the dressing changes without complication. With that being said the patient actually does show evidence of improvement although there is still an opening of the ulcer down to the bone. He is previously tested negative for osteomyelitis by way of MRI. There does not appear to be necrotic bone noted in the base of the wound. 09/03/18 on evaluation today patient actually appears to be doing slightly better in regard to the overall size of the wound although the depth is still just as significant. He does have bone noted in the base of the wound still unfortunately. He's not having as much discomfort which is good news. Again the bone does not appear to be necessarily necrotic. Overall I am happy with the appearance but nonetheless still not pleased with the fact that this has not gotten significantly better in the past week. He has been tolerating the dressing changes without complication. He still continues to remodel his house he states these get ready to finish the drywall and then paint. After that he states he may need to do some fishing. I think fishing can actually be beneficial for him I'm more concerned about the fact that if he does not back off as far as movement and getting around in general that is gonna end up with something more significant going on. Obviously he had no osteomyelitis previous but that could change in the future. 09/06/18 unfortunately we had to see the patient today for early follow-up due to the fact that he was having increased discomfort after having seen me on Tuesday. He states that even towards Tuesday afternoon and especially in the Wednesday he had a lot of pain due to the fact that he had passed buildup in the toe. The only thing we change was adding the Prisma to be packed into the base of the wound in order to try to help with  epithelialization. With that being said what I think may  have happened is that he is continuing to have some kind of infection which is draining. This may even be a bone infection in them ride the negative when we previously performed this may have been too early to detect. Nonetheless it does seem that he clapped and fluid which filled up calls pressure and then subsequently as it started being squeezed in pushed out got a little bit better but in general he discontinued using the Prisma and things have seemed to improve over the last 24 hours. He's just using the silver alginate which has been better for him in general mainly due to the fact likely that it's helping to drain the excess fluid this drain. Fortunately there does not appear to be any evidence of worsening infection spreading up his foot. 09/17/18 on evaluation today patient actually appears to be doing about the same in regard to his left great toe. With that being said he does not seem to show any evidence of infection at this time which is great news. He's also not having any excess fluid or moisture buildup which is also great news. In general I think this is stable but I'm still concerned about the fact it does not seem to be improving as well as I would like. Nonetheless he does have an appointment with try and foot on October 7. He does have a new wound on the left lateral lower extremity unfortunately this is due to a sheet-metal injury he tells me. This happened several days ago. This is causing a little bit of discomfort fortunately there does not appear to be any evidence of infection there is some Slough on the surface of this wound. Currently patient unfortunately is not wearing the offloading shoe that we have provided him at this time. In fact we have tried multiple ways of offloading his wound including a total contact cast which he was outside in so much actually caused more maceration due to the fact that he continues  to work on remodeling his bathroom. I've also attempted front offloading shoe which she states he only wears if he's going out somewhere such as out to eat he does not wear it when he's around in the house. Nonetheless everything that I've attempted from the offloading standpoint really does not seem to be something that he is able or willing to use unfortunately. [name], [name] ([id number]) 09/24/18 on evaluation today patient actually appears to be doing very well in regard to his great toe ulcer on the left foot. In fact this actually appears to be doing very well at this time. He is having no pain whatsoever and it in fact is the best of seeing it up to this point. 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 Nonetheless overall I feel like things are shown signs of great improvement and really there's not even much that we can pack into the wound at this point. In regard to the left lateral lower extremity this appears to be showing signs of improvement as well. Electronic Signature(s) Signed: 09/24/2018 9:56:08 AM By: Worthy Keeler PA-C Entered By: Worthy Keeler on 09/24/2018 08:51:35 [name], [name] ([id number]) -------------------------------------------------------------------------------- Physical Exam Details Patient Name: Marcus Loll T. Date of Service: 09/24/2018 8:15 AM Medical Record Number: [id number] Patient Account Number: [id number] Date of Birth/Sex:  10-04-1951 (67 y.o. M) Treating RN: Montey Hora Primary Care Provider: BESS, Valetta Fuller Other Clinician: Referring Provider: BESS, KATY Treating Provider/Extender: STONE III, HOYT Weeks in Treatment: 63 Constitutional Well-nourished and well-hydrated in no acute distress. Respiratory normal breathing without difficulty. Psychiatric this patient is able to make  decisions and demonstrates good insight into disease process. Alert and Oriented x 3. pleasant and cooperative. Notes On inspection today patient's wound bed actually had some callous covering the surface of the wound. This is in regard to the great toe. Actually perform debridement to remove the callous and subsequently revealed that there was a small opening with a little bit of slough and fluid buildup centrally. Nonetheless I did clear this away he had a little bit of minimal bleeding but fortunately nothing too significant. I had to be very cautious in how I debrided and clean this area did not want to damage any of the good healing. In regard to left lateral lower extremity no debridement was performed at this point. Electronic Signature(s) Signed: 09/24/2018 9:56:08 AM By: Worthy Keeler PA-C Entered By: Worthy Keeler on 09/24/2018 08:54:06 Cotta, [name] ([id number]) -------------------------------------------------------------------------------- Physician Orders Details Patient Name: [name]. Date of Service: 09/24/2018 8:15 AM Medical Record Number: [id number] Patient Account Number: [id number] Date of Birth/Sex: 1951/10/20 (67 y.o. M) Treating RN: Montey Hora Primary Care Provider: BESS, KATY Other Clinician: Referring Provider: BESS, KATY Treating Provider/Extender: STONE III, HOYT Weeks in Treatment: 22 Verbal / Phone Orders: No Diagnosis Coding ICD-10 Coding Code Description I87.2 Venous insufficiency (chronic) (peripheral) E11.621 Type 2 diabetes mellitus with foot ulcer L97.526 Non-pressure chronic ulcer of other part of left foot with bone involvement without evidence of necrosis S81.802A Unspecified open wound, left lower leg, initial encounter I63.9 Cerebral infarction, unspecified I10 Essential (primary) hypertension Wound Cleansing Wound #2 Left Toe Great o Clean wound with Normal Saline. o Cleanse wound with mild soap and water Wound #4  Left,Lateral Lower Leg o Clean wound with Normal Saline. o Cleanse wound with mild soap and water Anesthetic (add to Medication List) Wound #2 Left Toe Great o Topical Lidocaine 4% cream applied to wound bed prior to debridement (In Clinic Only). Wound #4 Left,Lateral Lower Leg o Topical Lidocaine 4% cream applied to wound bed prior to debridement (In Clinic Only). Primary Wound Dressing Wound #2 Left Toe Great o Silver Alginate Wound #4 Left,Lateral Lower Leg o Silver Collagen Secondary Dressing Wound #2 Left Toe Great o Gauze and Kerlix/Conform - secure with coban o Foam Wound #4 Left,Lateral Lower Leg o Other - coverlet or bandaid Dressing Change Frequency Sharron, [name] ([id number]) Wound #2 Left Toe Great o Change dressing every other day. Wound #4 Left,Lateral Lower Leg o Change dressing every other day. Follow-up Appointments Wound #2 Left Toe Great o Return Appointment in 2 weeks. Wound #4 Left,Lateral Lower Leg o Return Appointment in 2 weeks. Off-Loading Wound #2 Left Toe Great o Open toe surgical shoe with peg assist. o Other: - keep pressure off of area Wound #4 Left,Lateral Lower Leg o Open toe surgical shoe with peg assist. o Other: - keep pressure off of area Additional Orders / Instructions Wound #2 Left Toe Great o Stop Smoking o Vitamin A; Vitamin C, Zinc - Please add a multivitamin with 100% of these o Increase protein intake. o Activity as tolerated Wound #4 Left,Lateral Lower Leg o Stop Smoking o Vitamin A; Vitamin C, Zinc - Please add a multivitamin with 100% of these o Increase protein intake. o  Activity as tolerated Electronic Signature(s) Signed: 09/24/2018 9:56:08 AM By: Worthy Keeler PA-C Signed: 09/24/2018 5:00:45 PM By: Montey Hora Entered By: Montey Hora on 09/24/2018 08:41:35 [name], [name]  ([id number]) -------------------------------------------------------------------------------- Problem List Details Patient Name: Marcus Loll T. Date of Service: 09/24/2018 8:15 AM Medical Record Number: [id number] Patient Account Number: [id number] Date of Birth/Sex: July 19, 1951 (67 y.o. 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 04/19/2018 No Yes involvement without evidence of necrosis S81.802A Unspecified open wound, left lower leg, initial encounter 09/17/2018 No Yes I63.9 Cerebral infarction, unspecified 04/19/2018 No Yes I10 Essential (primary) hypertension 04/19/2018 No Yes Inactive Problems Resolved Problems ICD-10 Code Description Active Date Resolved Date S81.812A Laceration without foreign body, left lower leg, initial encounter 04/19/2018 04/19/2018 W54.627 Non-pressure chronic ulcer of other part of left lower leg with fat 04/19/2018 04/19/2018 layer exposed Electronic Signature(s) Signed: 09/24/2018 9:56:08 AM By: Ralene Bathe ([id number]) Entered By: Worthy Keeler on 09/24/2018 08:21:29 [name] ([id number]) -------------------------------------------------------------------------------- Progress Note Details Patient Name: Marcus Loll T. Date of Service: 09/24/2018 8:15 AM Medical Record Number: [id number] Patient Account Number: [id number] Date of Birth/Sex: 05-06-1951 (67 y.o. M) Treating RN: Montey Hora Primary Care Provider: BESS, KATY Other Clinician: Referring Provider: BESS, KATY Treating  Provider/Extender: Melburn Hake, HOYT Weeks in Treatment: 22 Subjective Chief Complaint Information obtained from Patient Left lower leg and left great toe ulcer History of Present Illness (HPI) The following HPI elements were documented for the patient's wound: Associated Signs and Symptoms: Patient has a history of chronic venous insufficiency, diabetes mellitus type II, its reform fraction, hypertension 04/19/18 on evaluation today patient presents initially concerning an ulcer of his left great toe and left anterior shin. He states that the shin was definitely a trauma injury where he struck this on a metal pole injuring leg. In regard to the distal left great toe he's really not sure what happened here although he does have a significant callous noted as well. He does have a pacemaker. Fortunately his ABI was 0.98 on the right and 0.83 on the left this appears to be doing fairly well. Overall I'm pleased with that. Nonetheless he states that this really has not seem to be improving. He has been tolerating the dressing changes without complication. The left anterior leg ulcer began on 04/05/18 according to what he tells [place] today although one note I did have for review said it was April 5. I'm inclined to believe it was the fifth she did have an office visit on 04/01/18 with his family nurse practitioner Faith Rogue. Nonetheless either way it's been going on this month for several weeks. The left great toe has actually been present since January 1 he does not know how this occurred. Fortunately has no pain at the site although he does have pain on the left anterior shin. His hemoglobin A1c is 8.0 in March 2019. 04/26/18 on evaluation today patient presents for follow-up concerning his life into your lower [place] also as well as his left great toe also. Fortunately the shin actually appears to be doing better on evaluation today in my opinion I'm not seeing as much  in the way of fluffernutter on  the surface although it does still require debridement today this is improved. Size wise it's really not much different. With that being said the total ulcer actually appears to be a little bit more macerated today I'm not sure why they state that several days ago when this was changed that was not the case. Fortunately is not having severe pain although both areas do hurt the shin is worse. 05/03/18 on evaluation today patient appears to be doing better in regard to his left first toe and left shin ulcers. He is been tolerating the dressing changes without complication and the good news is this seems to be showing signs of getting better. Overall I'm very pleased with the progress that has been made over the past week. Patient is still having pain especially in regard to the left shin. 05/10/18 on evaluation today patient appears to be doing rather well in regard to his two ulcers. The toe ulcer appears to be smaller he does have some undermining however in this had to be cleaned out just a little bit. With that being said overall I feel like this is showing signs of improvement. The left anterior shin ulcer also showing signs of improvement at this time. There does not appear to be any evidence of infection which is good news. 05/21/18 on evaluation today patient appears to be doing very well in regard to his left shin ulcer. He has been tolerating the dressing changes without complication. With that being said he is having issues at this point with his great toe on the left. We have not x-rayed that at this point I think we may need to. Nonetheless he has pain really at the roughly 5 o'clock location but nowhere else and I really cannot explain this I do not see any obvious form body. Nonetheless he continues to have issues with getting this area to close it's not progressing as nicely. 05/28/18 on evaluation today patient appears to be doing very well in regard to his left lower extremity anterior  ulcer. He has been tolerating the dressing changes without complication. Overall this is making excellent progress with the collagen dressing. His left great toe x-ray did return and showed that he had no if you that normality. He did have a plantar heel spur [name], Marcus T. ([id number]) but this is more degenerative. Otherwise just arthritis was noted no osteomyelitis and no obvious form body was visualized. Overall things seem to be going fairly well. With that being said he still has pain when looking at the toe from the plantar aspect at roughly the six back to the 4 o'clock location which is not quite as bad as last time but still hurts more than any other part of his wound. It does appear the Iodoflex has been of benefit however. 06/04/18 on evaluation today patient appears to be doing excellent in regard to his left anterior shin ulcer. This has made excellent progress and is showing signs of improving in fact very close to completely closing in healing. Nonetheless unfortunately his left great toe has not faired quite as well as far as progress is concerned. He notes that after last week's debridement he actually seem to be doing a little bit better for a couple of days until Thursday of last week when he had increases in discomfort as well is changes in the draining. He states that the drainage has been more green in color according to his wife and what he  has seen. He's also had more discomfort. He relates to me again that he really feels like this all started when he stepped on a nail although he does not exactly remember it it seemed to be a puncture wound that he feels may be the culprit for why all this started. Nonetheless I am getting more concerned about the possibility of osteomyelitis despite the fact that the x-ray was negative I was hopeful in this interim since last week to this week that we would see some improvement in the overall appearance of the toe. Unfortunately if  anything I feel like it's a little worse. It was macerated but I think we can attribute this to the fact that the patient did have an episode last night we got stuck in the rain and he thinks the dressing got wet. He did not change it I told him in the future if this were to happen to please go ahead and change the dressing it won't hurt to change it more frequently in that situation. Otherwise other than the maceration I do not see any evidence of anything being any worse but it also does not appear to be any better. 06/11/18 on evaluation today patient's wounds both the great toe as well as the left shin both on the left side show signs of good improvement. Fortunately the patient's culture came back negative in regard to the toe and there does not appear to be any evidence of infection. His MRI is actually scheduled for 06/19/18. Fortunately this is right before I see him next week and then we can go to the results of the culture at that point. Patient is in agreement with that plan. With that being said otherwise he has been showing signs of having less pain in regard to the toe which is also good news my hope is that he does not have osteomyelitis. 06/18/18 on evaluation today patient appears to be doing very well in regard to his left lower extremity ulcer. He also states at this point in time that he has been tolerating the dressing changes with the toe it appears also to be doing very well at this point. In general I'm happy with the progress that he seems to be making. With that being said he still continues to have pain in regard to the great toe ulcer fortunately this does not seem to be as significant as it was in the beginning but still nonetheless he is having some pain. No fevers, chills, nausea, or vomiting noted at this time. 06/25/18 on evaluation today patient actually appears to be doing very well in regard to his left anterior shin ulcer. He's been tolerating the dressing changes  without complication in this seems to have done extremely well. In fact this appears to be healed. With that being said his toe ulcer still continues to get in trouble fortunately not as much as has been in the past and there have been some signs of improvement and better granulation although I still think he's getting a lot of callous to the area which seems to indicate a lot of pressure and friction. I believe he may benefit from a total contact cast especially now that the left shin ulcer has healed. 07/02/18 on evaluation today patient appears to be doing rather well in regard to his left anterior shin ulcer which is actually completely healed at this point. With that being said he has left great toe ulcer actually appears to be doing better he states  over the weekend he actually had a significant amount of drainage from the toe after it had been hurting for some time. He states when his wife change the dressing there was a lot of discharge and fluid he actually took a picture he showed me today. Since that time he has not noted as much tenderness he feels like something may have "popped out" 07/09/18 on evaluation today patient appears to be doing about the same in regard to his toe ulcer. He really has not shown any signs of significant improvement this seems to just be maintaining at best. He continues to be extremely active in fact I think this may be the biggest issue that he continues to do things as normal in fact right now he's remodeling the bathroom. Obviously I think he is a very active individual and as previously discussed I think a total contact cast would be of great benefit for him. This was discussed with patient yet again today I think we are gonna proceed in this regard. 07/12/18 patient presents today for his first total contact cast change. He has had this on for three days and states that though he felt like it may be rubbing a little bit around his ankles there does not appear  to be any injury at this point. Overall the wound actually looks better in my pinion. 07/16/18 on evaluation today patient's wound actually appears to be doing much better at this point. He has been tolerating the dressing changes without complication. With that being said I do believe the total contact cast has been of benefit he states he is having much less discomfort that he previously had. Overall I'm very pleased with how things have progressed in just one weeks worth of the total contact cast. [name], [name] ([id number]) 07/23/18 on evaluation today patient's toe ulcer on the left great toe actually appears to be showing signs of improvement which is good news. In general I'm very pleased with the progress that he has made in the cast over the past two weeks. Fortunately there does not appear to be any evidence of infection at this time. He also is not having as much maceration as was previously noted. The patient is happy in this regard that he notes that the boot and cast is not the most comfortable thing in the world. 07/30/18-He is here in follow-up evaluation for left great toe ulcer. He presents in a total contact cast, after removal he is noted to be macerated with new injury to the dorsum of his left foot; he continues to work outside and this is most likely secondary to excessive sweating versus wet from a shower. We will hold off on reapplying total contact cast, allowing the dorsal foot injury to resolve and allowing for complete drying as he will continue to work outside. He will have a nurse visit next Monday to evaluate dorsal foot partial thickness ulcer and moisture/maceration to foot and follow-up in 2 weeks 08/13/18 on evaluation today patient's toe ulcer actually appears to be doing about the same. Fortunately the dorsal foot ulcer has healed and his foot is not appearing to be as macerated. He continues to be overly active in my pinion in regard to the remodeling of his  home he states he was actually finishing up some plumbing yesterday. Nonetheless I think that right now being outside the majority the day in a total contact cast in roughly 90 to 100 weather has been counterproductive not helpful for them in  regard to healing the wound. I explained to him that if we're gonna get this to heal he may need to drop back a bit and take it easy to allow this to heal. He states that's not something is gonna likely be able to do. 08/20/18 on evaluation today patient actually appears to be doing a little better in regard to his toe ulcer. He's been tolerating the dressing changes much better he did not tolerate the front offloading shoe however. He states it actually caused his left knee to hurt that something that is been told before he probably needs replacement nonetheless he wasn't able to where the shoe past Friday so really only for about three days or so. Nonetheless we have not tried a PEG assist offloading shoe. I think this may be something that we could try for him. In general I do feel like the wound size is slightly smaller in particular in regard to the area of undermining. 08/27/18 on evaluation today patient actually appears to be doing possibly a bit better in regard to his left great toe ulcer. He's been tolerating the dressing changes without complication. With that being said the patient actually does show evidence of improvement although there is still an opening of the ulcer down to the bone. He is previously tested negative for osteomyelitis by way of MRI. There does not appear to be necrotic bone noted in the base of the wound. 09/03/18 on evaluation today patient actually appears to be doing slightly better in regard to the overall size of the wound although the depth is still just as significant. He does have bone noted in the base of the wound still unfortunately. He's not having as much discomfort which is good news. Again the bone does not appear  to be necessarily necrotic. Overall I am happy with the appearance but nonetheless still not pleased with the fact that this has not gotten significantly better in the past week. He has been tolerating the dressing changes without complication. He still continues to remodel his house he states these get ready to finish the drywall and then paint. After that he states he may need to do some fishing. I think fishing can actually be beneficial for him I'm more concerned about the fact that if he does not back off as far as movement and getting around in general that is gonna end up with something more significant going on. Obviously he had no osteomyelitis previous but that could change in the future. 09/06/18 unfortunately we had to see the patient today for early follow-up due to the fact that he was having increased discomfort after having seen me on Tuesday. He states that even towards Tuesday afternoon and especially in the Wednesday he had a lot of pain due to the fact that he had passed buildup in the toe. The only thing we change was adding the Prisma to be packed into the base of the wound in order to try to help with epithelialization. With that being said what I think may have happened is that he is continuing to have some kind of infection which is draining. This may even be a bone infection in them ride the negative when we previously performed this may have been too early to detect. Nonetheless it does seem that he clapped and fluid which filled up calls pressure and then subsequently as it started being squeezed in pushed out got a little bit better but in general he discontinued using the Prisma and  things have seemed to improve over the last 24 hours. He's just using the silver alginate which has been better for him in general mainly due to the fact likely that it's helping to drain the excess fluid this drain. Fortunately there does not appear to be any evidence of worsening infection  spreading up his foot. 09/17/18 on evaluation today patient actually appears to be doing about the same in regard to his left great toe. With that being said he does not seem to show any evidence of infection at this time which is great news. He's also not having any excess fluid or moisture buildup which is also great news. In general I think this is stable but I'm still concerned about the fact it does not seem to be improving as well as I would like. Nonetheless he does have an appointment with try and foot on October 7. He does have a new wound on the left lateral lower extremity unfortunately this is due to a sheet-metal injury he tells me. This happened several days ago. This is causing a little bit of discomfort fortunately there does not appear to be any evidence of infection there is some Slough on the surface of this wound. [name], [name] ([id number]) Currently patient unfortunately is not wearing the offloading shoe that we have provided him at this time. In fact we have tried multiple ways of offloading his wound including a total contact cast which he was outside in so much actually caused more maceration due to the fact that he continues to work on remodeling his bathroom. I've also attempted front offloading shoe which she states he only wears if he's going out somewhere such as out to eat he does not wear it when he's around in the house. Nonetheless everything that I've attempted from the offloading standpoint really does not seem to be something that he is able or willing to use unfortunately. 09/24/18 on evaluation today patient actually appears to be doing very well in regard to his great toe ulcer on the left foot. In fact this actually appears to be doing very well at this time. He is having no pain whatsoever and it in fact is the best of seeing it up to this point. 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 Nonetheless overall I feel like things are shown signs of great improvement and really there's not even much that we can pack into the wound at this point. In regard to the left lateral lower extremity this appears to be showing signs of improvement as well. Patient History Information obtained from Patient. Family History Cancer - Father, Diabetes - Father, Heart Disease - Mother,Father, Hypertension - Mother, Kidney Disease - Child, No family history of Lung Disease, Seizures, Stroke, Thyroid Problems, Tuberculosis. Social History Current every day smoker - 1 - 1/2 pack daily, Marital Status - Married, Alcohol Use - Rarely, Drug Use - No History, Caffeine Use - Daily - coffee. Review of Systems (ROS) Constitutional Symptoms (General Health) Denies complaints or symptoms of Fever, Chills. Respiratory The patient has no complaints or symptoms. Cardiovascular The patient has no complaints or symptoms. Psychiatric The patient has no complaints or symptoms. Objective Constitutional Well-nourished and well-hydrated in no acute distress. Vitals Time Taken: 8:22 AM, Height: 69 in, Weight: 238 lbs, BMI:  35.1, Temperature: 97.8 F, Pulse: 84 bpm, Respiratory Rate: 16 breaths/min, Blood Pressure: 104/60 mmHg. Respiratory normal breathing without difficulty. Psychiatric this patient is able to make decisions and demonstrates good insight into disease process. Alert and Oriented x 3. pleasant [name], [name]. ([id number]) and cooperative. General Notes: On inspection today patient's wound bed actually had some callous covering the surface of the wound. This is in regard to the great toe. Actually perform debridement to remove the callous and subsequently revealed that there was a small opening with a little bit of slough and fluid buildup centrally. Nonetheless I did clear this away he had a little bit  of minimal bleeding but fortunately nothing too significant. I had to be very cautious in how I debrided and clean this area did not want to damage any of the good healing. In regard to left lateral lower extremity no debridement was performed at this point. Integumentary (Hair, Skin) Wound #2 status is Open. Original cause of wound was Trauma. The wound is located on the Left Toe Great. The wound measures 0.1cm length x 0.1cm width x 0.1cm depth; 0.008cm^2 area and 0.001cm^3 volume. There is Fat Layer (Subcutaneous Tissue) Exposed exposed. There is no tunneling or undermining noted. There is a small amount of serous drainage noted. The wound margin is flat and intact. There is no granulation within the wound bed. There is a medium (34- 66%) amount of necrotic tissue within the wound bed including Adherent Slough. The periwound skin appearance exhibited: Callus. The periwound skin appearance did not exhibit: Crepitus, Excoriation, Induration, Rash, Scarring, Dry/Scaly, Maceration, Atrophie Blanche, Cyanosis, Ecchymosis, Hemosiderin Staining, Mottled, Pallor, Rubor, Erythema. Periwound temperature was noted as No Abnormality. The periwound has tenderness on palpation. Wound #4 status is Open. Original cause of wound was Trauma. The wound is located on the Left,Lateral Lower Leg. The wound measures 2.2cm length x 1.2cm width x 0.1cm depth; 2.073cm^2 area and 0.207cm^3 volume. Assessment Active Problems ICD-10 Venous insufficiency (chronic) (peripheral) Type 2 diabetes mellitus with foot ulcer Non-pressure chronic ulcer of other part of left foot with bone involvement without evidence of necrosis Unspecified open wound, left lower leg, initial encounter Cerebral infarction, unspecified Essential (primary) hypertension Procedures Wound #2 Pre-procedure diagnosis of Wound #2 is a Diabetic Wound/Ulcer of the Lower Extremity located on the Left Toe Great .Severity of Tissue Pre Debridement is: Fat  layer exposed. There was a Excisional Skin/Subcutaneous Tissue Debridement with a total area of 0.01 sq cm performed by STONE III, HOYT E., PA-C. With the following instrument(s): Curette to remove Viable and Non-Viable tissue/material. Material removed includes Callus, Subcutaneous Tissue, and Slough after achieving pain control using Lidocaine 4% Topical Solution. No specimens were taken. A time out was conducted at 08:39, prior to the start of the procedure. A Minimum amount of bleeding was controlled with Pressure. The procedure was tolerated well with a pain level of 0 throughout and a pain level of 0 following the procedure. Post Debridement Measurements: 0.2cm length x 0.2cm width x 0.4cm depth; 0.013cm^3 volume. Character of Wound/Ulcer Post Debridement is improved. Severity of Tissue Post Debridement is: Fat layer exposed. Post procedure Diagnosis Wound #2: Same as Pre-Procedure Lem, [name] ([id number]) Plan Wound Cleansing: Wound #2 Left Toe Great: Clean wound with Normal Saline. Cleanse wound with mild soap and water Wound #4 Left,Lateral Lower Leg: Clean wound with Normal Saline. Cleanse wound with mild soap and water Anesthetic (add to Medication List): Wound #2 Left Toe Great: Topical Lidocaine 4% cream applied to  wound bed prior to debridement (In Clinic Only). Wound #4 Left,Lateral Lower Leg: Topical Lidocaine 4% cream applied to wound bed prior to debridement (In Clinic Only). Primary Wound Dressing: Wound #2 Left Toe Great: Silver Alginate Wound #4 Left,Lateral Lower Leg: Silver Collagen Secondary Dressing: Wound #2 Left Toe Great: Gauze and Kerlix/Conform - secure with coban Foam Wound #4 Left,Lateral Lower Leg: Other - coverlet or bandaid Dressing Change Frequency: Wound #2 Left Toe Great: Change dressing every other day. Wound #4 Left,Lateral Lower Leg: Change dressing every other day. Follow-up Appointments: Wound #2 Left Toe Great: Return  Appointment in 2 weeks. Wound #4 Left,Lateral Lower Leg: Return Appointment in 2 weeks. Off-Loading: Wound #2 Left Toe Great: Open toe surgical shoe with peg assist. Other: - keep pressure off of area Wound #4 Left,Lateral Lower Leg: Open toe surgical shoe with peg assist. Other: - keep pressure off of area Additional Orders / Instructions: Wound #2 Left Toe Great: Stop Smoking Vitamin A; Vitamin C, Zinc - Please add a multivitamin with 100% of these Increase protein intake. Activity as tolerated Wound #4 Left,Lateral Lower Leg: Stop Smoking Vitamin A; Vitamin C, Zinc - Please add a multivitamin with 100% of these Increase protein intake. Activity as tolerated Bogue, MARCQUIS RIDLON. ([id number]) Currently my suggestion is gonna be that we continue with the Current wound care measures at both sites since he seems to be doing so well. We also discussed whether or not he needs to go back and see his podiatrist. Being that his toe is doing so well today and I cannot even probe down to bone I feel like that going to see the podiatrist is probably not gonna be beneficial for him is already established as a patient there in fact that [facility] sent him to me. Nonetheless I don't think that any further surgery is going to be required. Also do not want any need this office visits for him. For that reason my suggestion is gonna be that we go ahead and initiate the above wound care measures over the next week. I will see him back for reevaluation to see were things stand he is going to put the podiatry appointment on hold for now. Please see above for specific wound care orders. We will see patient for re-evaluation in 2 week(s) here in the clinic. If anything worsens or changes patient will contact our office for additional recommendations. Electronic Signature(s) Signed: 09/24/2018 9:56:08 AM By: Worthy Keeler PA-C Entered By: Worthy Keeler on 09/24/2018 08:55:48 [name], [name]  ([id number]) -------------------------------------------------------------------------------- ROS/PFSH Details Patient Name: [name]. Date of Service: 09/24/2018 8:15 AM Medical Record Number: [id number] Patient Account Number: [id number] Date of Birth/Sex: 1951-12-19 (67 y.o. M) Treating RN: Montey Hora Primary Care Provider: BESS, KATY Other Clinician: Referring Provider: BESS, KATY Treating Provider/Extender: STONE III, HOYT Weeks in Treatment: 22 Information Obtained From Patient Wound History Do you currently have one or more open woundso Yes How many open wounds do you currently haveo 2 Approximately how long have you had your woundso 2 weeks How have you been treating your wound(s) until nowo yes Doctor Has your wound(s) ever healed and then re-openedo No Have you had any lab work done in the past montho No Have you tested positive for an antibiotic resistant organism (MRSA, VRE)o No Have you tested positive for osteomyelitis (bone infection)o No Have you had any tests for circulation on your legso No Have you had other problems associated with your woundso Infection, Swelling Constitutional Symptoms (  General Health) Complaints and Symptoms: Negative for: Fever; Chills Eyes Medical History: Negative for: Cataracts; Glaucoma; Optic Neuritis Ear/Nose/Mouth/Throat Medical History: Negative for: Chronic sinus problems/congestion Hematologic/Lymphatic Medical History: Positive for: Lymphedema Negative for: Anemia; Hemophilia; Human Immunodeficiency Virus; Sickle Cell Disease Respiratory Complaints and Symptoms: No Complaints or Symptoms Medical History: Negative for: Aspiration; Asthma; Chronic Obstructive Pulmonary Disease (COPD); Pneumothorax; Sleep Apnea; Tuberculosis Cardiovascular Complaints and Symptoms: No Complaints or Symptoms Medical History: MATHEU, PLOEGER ([id number]) Positive for: Arrhythmia - pacemaker; Coronary Artery Disease; Hypertension;  Myocardial Infarction Negative for: Deep Vein Thrombosis; Hypotension; Peripheral Arterial Disease; Peripheral Venous Disease; Phlebitis; Vasculitis Gastrointestinal Medical History: Negative for: Cirrhosis ; Colitis; Crohnos; Hepatitis A; Hepatitis B; Hepatitis C Endocrine Medical History: Positive for: Type II Diabetes Negative for: Type I Diabetes Time with diabetes: 5 years Treated with: Oral agents Blood sugar tested every day: Yes Tested : daily AM Blood sugar testing results: Breakfast: 174 Genitourinary Medical History: Negative for: End Stage Renal Disease Immunological Medical History: Negative for: Lupus Erythematosus; Raynaudos; Scleroderma Integumentary (Skin) Medical History: Negative for: History of Burn; History of pressure wounds Musculoskeletal Medical History: Negative for: Gout; Rheumatoid Arthritis; Osteoarthritis; Osteomyelitis Neurologic Medical History: Positive for: Neuropathy Negative for: Dementia; Quadriplegia; Paraplegia; Seizure Disorder Oncologic Medical History: Positive for: Received Chemotherapy; Received Radiation - Seeds prostate Psychiatric Complaints and Symptoms: No Complaints or Symptoms Medical History: Negative for: Anorexia/bulimia; Confinement Anxiety Mittelstaedt, ARN MCOMBER ([id number]) Immunizations Pneumococcal Vaccine: Received Pneumococcal Vaccination: Yes Tetanus Vaccine: Last tetanus shot: 12/25/2017 Implantable Devices Family and Social History Cancer: Yes - Father; Diabetes: Yes - Father; Heart Disease: Yes - Mother,Father; Hypertension: Yes - Mother; Kidney Disease: Yes - Child; Lung Disease: No; Seizures: No; Stroke: No; Thyroid Problems: No; Tuberculosis: No; Current every day smoker - 1 - 1/2 pack daily; Marital Status - Married; Alcohol Use: Rarely; Drug Use: No History; Caffeine Use: Daily - coffee; Financial Concerns: No; Food, Clothing or Shelter Needs: No; Support System Lacking: No; Transportation Concerns:  No Physician Affirmation I have reviewed and agree with the above information. Electronic Signature(s) Signed: 09/24/2018 9:56:08 AM By: Worthy Keeler PA-C Signed: 09/24/2018 5:00:45 PM By: Montey Hora Entered By: Worthy Keeler on 09/24/2018 08:53:44 [name], [name] ([id number]) -------------------------------------------------------------------------------- SuperBill Details Patient Name: [name]. Date of Service: 09/24/2018 Medical Record Number: [id number] Patient Account Number: [id number] Date of Birth/Sex: [birth year] (67 y.o. 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 S81.802A Unspecified open wound, left lower leg, initial encounter I63.9 Cerebral infarction, unspecified I10 Essential (primary) hypertension Facility Procedures CPT4: Description Modifier Quantity Code [id number] 11042 - DEB SUBQ TISSUE 20 SQ CM/< 1 ICD-10 Diagnosis Description 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

## 2018-09-25 NOTE — Progress Notes (Signed)
[name], [name] ([id number]) Visit Report for 09/24/2018 Arrival Information Details Patient Name: [name], BURCH. Date of Service: 09/24/2018 8:15 AM Medical Record Number: [id number] Patient Account Number: [id number] Date of Birth/Sex: 06-29-1951 (67 y.o. M) Treating RN: Montey Hora Primary Care Kalonji Zurawski: BESS, KATY Other Clinician: Referring Mckaylee Dimalanta: BESS, KATY Treating Berania Peedin/Extender: STONE III, HOYT Weeks in Treatment: 22 Visit Information History Since Last Visit Added or deleted any medications: No Patient Arrived: Ambulatory Any new allergies or adverse reactions: No Arrival Time: 08:20 Had a fall or experienced change in No Accompanied By: self activities of daily living that may affect Transfer Assistance: None risk of falls: Patient Identification Verified: Yes Signs or symptoms of abuse/neglect since last visito No Secondary Verification Process Completed: Yes Hospitalized since last visit: No Patient Requires Transmission-Based No Implantable device outside of the clinic excluding No Precautions: cellular tissue based products placed in the center Patient Has Alerts: Yes since last visit: Patient Alerts: Type II Has Dressing in Place as Prescribed: Yes Diabetic Pain Present Now: No Electronic Signature(s) Signed: 09/24/2018 8:47:37 AM By: Lorine Bears RCP, RRT, CHT Entered By: Lorine Bears on 09/24/2018 08:21:41 Bosak, Huntley Dec ([id number]) -------------------------------------------------------------------------------- Encounter Discharge Information Details Patient Name: Marcus Loll T. Date of Service: 09/24/2018 8:15 AM Medical Record Number: [id number] Patient Account Number: [id number] Date of Birth/Sex: 02-27-1951 (67 y.o. M) Treating RN: Montey Hora Primary Care Yazleen Molock: BESS, KATY Other Clinician: Referring Burlene Montecalvo: BESS, KATY Treating Ashni Lonzo/Extender: STONE III, HOYT Weeks in Treatment: 22 Encounter  Discharge Information Items Discharge Condition: Stable Ambulatory Status: Ambulatory Discharge Destination: Home Transportation: Private Auto Accompanied By: self Schedule Follow-up Appointment: Yes Clinical Summary of Care: Post Procedure Vitals: Temperature (F): 97.8 Pulse (bpm): 84 Respiratory Rate (breaths/min): 18 Blood Pressure (mmHg): 104/60 Electronic Signature(s) Signed: 09/24/2018 5:00:45 PM By: Montey Hora Entered By: Montey Hora on 09/24/2018 08:49:52 Crago, Huntley Dec ([id number]) -------------------------------------------------------------------------------- Lower Extremity Assessment Details Patient Name: Marcus Loll T. Date of Service: 09/24/2018 8:15 AM Medical Record Number: [id number] Patient Account Number: [id number] Date of Birth/Sex: October 23, 1951 (67 y.o. M) Treating RN: Cornell Barman Primary Care Ayleen Mckinstry: BESS, KATY Other Clinician: Referring Ravina Milner: BESS, KATY Treating Juelle Dickmann/Extender: STONE III, HOYT Weeks in Treatment: 22 Edema Assessment Assessed: [Left: No] [Right: No] [Left: Edema] [Right: :] Calf Left: Right: Point of Measurement: 36 cm From Medial Instep 37 cm cm Ankle Left: Right: Point of Measurement: 11 cm From Medial Instep 23.8 cm cm Vascular Assessment Pulses: Dorsalis Pedis Palpable: [Left:Yes] Posterior Tibial Extremity colors, hair growth, and conditions: Extremity Color: [Left:Hyperpigmented] Hair Growth on Extremity: [Left:No] Temperature of Extremity: [Left:Warm] Capillary Refill: [Left:< 3 seconds] Toe Nail Assessment Left: Right: Thick: Yes Discolored: Yes Deformed: No Improper Length and Hygiene: No Electronic Signature(s) Signed: 09/24/2018 4:33:57 PM By: Gretta Cool, BSN, RN, CWS, Kim RN, BSN Entered By: Gretta Cool, BSN, RN, CWS, Kim on 09/24/2018 07:37:10 [name] ([id number]) -------------------------------------------------------------------------------- Multi Wound Chart Details Patient Name: [name]. Date of Service: 09/24/2018 8:15 AM Medical Record Number: [id number] Patient Account Number: [id number] Date of Birth/Sex: 05-25-1951 (67 y.o. M) Treating RN: Montey Hora Primary Care Yacine Droz: BESS, KATY Other Clinician: Referring Rovena Hearld: BESS, KATY Treating Claud Gowan/Extender: STONE III, HOYT Weeks in Treatment: 22 Vital Signs Height(in): 69 Pulse(bpm): 84 Weight(lbs): 238 Blood Pressure(mmHg): 104/60 Body Mass Index(BMI): 35 Temperature(F): 97.8 Respiratory Rate 16 (breaths/min): Photos: [2:No Photos] [4:No Photos] [N/A:N/A] Wound Location: [2:Left Toe Great] [4:Left, Lateral Lower Leg] [N/A:N/A] Wounding Event: [2:Trauma] [4:Trauma] [N/A:N/A] Primary Etiology: [2:Diabetic Wound/Ulcer of the Lower Extremity] [4:Trauma, Other] [  N/A:N/A] Secondary Etiology: [2:Trauma, Other] [4:N/A] [N/A:N/A] Comorbid History: [2:Lymphedema, Arrhythmia, Coronary Artery Disease, Hypertension, Myocardial Infarction, Type II Diabetes, Neuropathy, Received Chemotherapy, Received Radiation] [4:N/A] [N/A:N/A] Date Acquired: [2:12/25/2017] [4:09/07/2018] [N/A:N/A] Weeks of Treatment: [2:22] [4:1] [N/A:N/A] Wound Status: [2:Open] [4:Open] [N/A:N/A] Pending Amputation on [2:Yes] [4:No] [N/A:N/A] Presentation: Measurements L x W x D [2:0.1x0.1x0.1] [4:2.2x1.2x0.1] [N/A:N/A] (cm) Area (cm) : [2:0.008] [4:2.073] [N/A:N/A] Volume (cm) : [2:0.001] [4:0.207] [N/A:N/A] % Reduction in Area: [2:83.00%] [4:-10.00%] [N/A:N/A] % Reduction in Volume: [2:80.00%] [4:-10.10%] [N/A:N/A] Classification: [2:Grade 1] [4:Full Thickness Without Exposed Support Structures] [N/A:N/A] Exudate Amount: [2:Small] [4:N/A] [N/A:N/A] Exudate Type: [2:Serous] [4:N/A] [N/A:N/A] Exudate Color: [2:amber] [4:N/A] [N/A:N/A] Wound Margin: [2:Flat and Intact] [4:N/A] [N/A:N/A] Granulation Amount: [2:None Present (0%)] [4:N/A] [N/A:N/A] Necrotic Amount: [2:Medium (34-66%)] [4:N/A] [N/A:N/A] Exposed Structures: [2:Fat  Layer (Subcutaneous Tissue) Exposed: Yes Fascia: No Tendon: No Muscle: No] [4:N/A] [N/A:N/A] Joint: No Bone: No Epithelialization: Large (67-100%) N/A N/A Periwound Skin Texture: Callus: Yes No Abnormalities Noted N/A Excoriation: No Induration: No Crepitus: No Rash: No Scarring: No Periwound Skin Moisture: Maceration: No No Abnormalities Noted N/A Dry/Scaly: No Periwound Skin Color: Atrophie Blanche: No No Abnormalities Noted N/A Cyanosis: No Ecchymosis: No Erythema: No Hemosiderin Staining: No Mottled: No Pallor: No Rubor: No Temperature: No Abnormality N/A N/A Tenderness on Palpation: Yes No N/A Wound Preparation: Ulcer Cleansing: N/A N/A Rinsed/Irrigated with Saline Topical Anesthetic Applied: Other: lidocaine 4% Treatment Notes Electronic Signature(s) Signed: 09/24/2018 5:00:45 PM By: Montey Hora Entered By: Montey Hora on 09/24/2018 08:39:53 Bagdasarian, Huntley Dec ([id number]) -------------------------------------------------------------------------------- Multi-Disciplinary Care Plan Details Patient Name: [name]. Date of Service: 09/24/2018 8:15 AM Medical Record Number: [id number] Patient Account Number: [id number] Date of Birth/Sex: [birth year] (67 y.o. M) Treating RN: Montey Hora Primary Care Elizabelle Fite: BESS, KATY Other Clinician: Referring Donni Oglesby: BESS, KATY Treating Kalia Vahey/Extender: STONE III, HOYT Weeks in Treatment: 22 Active Inactive ` Abuse / Safety / Falls / Self Care Management Nursing Diagnoses: Potential for falls Goals: Patient will remain injury free related to falls Date Initiated: 04/19/2018 Target Resolution Date: 05/04/2018 Goal Status: Active Interventions: Assess fall risk on admission and as needed Notes: ` Nutrition Nursing Diagnoses: Impaired glucose control: actual or potential Goals: Patient/caregiver agrees to and verbalizes understanding of need to use nutritional supplements and/or vitamins as  prescribed Date Initiated: 04/19/2018 Target Resolution Date: 06/07/2018 Goal Status: Active Patient/caregiver verbalizes understanding of need to maintain therapeutic glucose control per primary care physician Date Initiated: 04/19/2018 Target Resolution Date: 05/31/2018 Goal Status: Active Interventions: Assess HgA1c results as ordered upon admission and as needed Assess patient nutrition upon admission and as needed per policy Notes: ` Orientation to the Wound Care Program Nursing Diagnoses: Knowledge deficit related to the wound healing center program Goals: Patient/caregiver will verbalize understanding of the [place] ZAVEN, KLEMENS ([id number]) Date Initiated: 04/19/2018 Target Resolution Date: 06/29/2018 Goal Status: Active Interventions: Provide education on orientation to the wound center Notes: ` Wound/Skin Impairment Nursing Diagnoses: Impaired tissue integrity Goals: Ulcer/skin breakdown will heal within 14 weeks Date Initiated: 04/19/2018 Target Resolution Date: 06/29/2018 Goal Status: Active Interventions: Assess patient/caregiver ability to obtain necessary supplies Assess patient/caregiver ability to perform ulcer/skin care regimen upon admission and as needed Assess ulceration(s) every visit Notes: Electronic Signature(s) Signed: 09/24/2018 5:00:45 PM By: Montey Hora Entered By: Montey Hora on 09/24/2018 08:39:42 Detloff, Huntley Dec ([id number]) -------------------------------------------------------------------------------- Pain Assessment Details Patient Name: Marcus Loll T. Date of Service: 09/24/2018 8:15 AM Medical Record Number: [id number] Patient Account Number: [id number] Date of Birth/Sex: July 25, 1951 (67 y.o. M)  Treating RN: Montey Hora Primary Care Tahje Borawski: BESS, KATY Other Clinician: Referring Vong Garringer: BESS, KATY Treating Cristofher Livecchi/Extender: STONE III, HOYT Weeks in Treatment: 22 Active Problems Location of Pain  Severity and Description of Pain Patient Has Paino No Site Locations Pain Management and Medication Current Pain Management: Electronic Signature(s) Signed: 09/24/2018 8:47:37 AM By: Lorine Bears RCP, RRT, CHT Signed: 09/24/2018 5:00:45 PM By: Montey Hora Entered By: Lorine Bears on 09/24/2018 08:21:52 [name] ([id number]) -------------------------------------------------------------------------------- Patient/Caregiver Education Details Patient Name: [name]. Date of Service: 09/24/2018 8:15 AM Medical Record Number: [id number] Patient Account Number: [id number] Date of Birth/Gender: December 04, 1951 (67 y.o. M) Treating RN: Montey Hora Primary Care Physician: BESS, KATY Other Clinician: Referring Physician: Lillard Anes Treating Physician/Extender: Melburn Hake, HOYT Weeks in Treatment: 22 Education Assessment Education Provided To: Patient Education Topics Provided Offloading: Handouts: Other: offloading toe Methods: Explain/Verbal Responses: State content correctly Wound/Skin Impairment: Handouts: Other: wound care as ordered Methods: Demonstration, Explain/Verbal Responses: State content correctly Electronic Signature(s) Signed: 09/24/2018 5:00:45 PM By: Montey Hora Entered By: Montey Hora on 09/24/2018 08:40:53 Yglesias, Huntley Dec ([id number]) -------------------------------------------------------------------------------- Wound Assessment Details Patient Name: Marcus Loll T. Date of Service: 09/24/2018 8:15 AM Medical Record Number: [id number] Patient Account Number: [id number] Date of Birth/Sex: [birth year] (67 y.o. M) Treating RN: Cornell Barman Primary Care Nishaan Stanke: BESS, KATY Other Clinician: Referring Chavon Lucarelli: BESS, KATY Treating Anterio Scheel/Extender: STONE III, HOYT Weeks in Treatment: 22 Wound Status Wound Number: 2 Primary Diabetic Wound/Ulcer of the Lower Extremity Etiology: Wound Location: Left Toe  Great Secondary Trauma, Other Wounding Event: Trauma Etiology: Date Acquired: 12/25/2017 Wound Open Weeks Of Treatment: 22 Status: Clustered Wound: No Comorbid Lymphedema, Arrhythmia, Coronary Artery Pending Amputation On Presentation History: Disease, Hypertension, Myocardial Infarction, Type II Diabetes, Neuropathy, Received Chemotherapy, Received Radiation Photos Photo Uploaded By: Gretta Cool, BSN, RN, CWS, Kim on 09/24/2018 09:19:57 Wound Measurements Length: (cm) 0.1 Width: (cm) 0.1 Depth: (cm) 0.1 Area: (cm) 0.008 Volume: (cm) 0.001 % Reduction in Area: 83% % Reduction in Volume: 80% Epithelialization: Large (67-100%) Tunneling: No Undermining: No Wound Description Classification: Grade 1 Foul Odor Wound Margin: Flat and Intact Slough/Fi Exudate Amount: Small Exudate Type: Serous Exudate Color: amber After Cleansing: No brino Yes Wound Bed Granulation Amount: None Present (0%) Exposed Structure Necrotic Amount: Medium (34-66%) Fascia Exposed: No Necrotic Quality: Adherent Slough Fat Layer (Subcutaneous Tissue) Exposed: Yes Tendon Exposed: No Muscle Exposed: No Joint Exposed: No Guttierrez, Huntley Dec ([id number]) Bone Exposed: No Periwound Skin Texture Texture Color No Abnormalities Noted: No No Abnormalities Noted: No Callus: Yes Atrophie Blanche: No Crepitus: No Cyanosis: No Excoriation: No Ecchymosis: No Induration: No Erythema: No Rash: No Hemosiderin Staining: No Scarring: No Mottled: No Pallor: No Moisture Rubor: No No Abnormalities Noted: No Dry / Scaly: No Temperature / Pain Maceration: No Temperature: No Abnormality Tenderness on Palpation: Yes Wound Preparation Ulcer Cleansing: Rinsed/Irrigated with Saline Topical Anesthetic Applied: Other: lidocaine 4%, Treatment Notes Wound #2 (Left Toe Great) 1. Cleansed with: Clean wound with Normal Saline 2. Anesthetic Topical Lidocaine 4% cream to wound bed prior to debridement 4. Dressing  Applied: Calcium Alginate with Silver 5. Secondary Dressing Applied Foam Kerlix/Conform 7. Secured with Tape Notes SIlvercell, foam, secured with conform and tape. Electronic Signature(s) Signed: 09/24/2018 4:33:57 PM By: Gretta Cool, BSN, RN, CWS, Kim RN, BSN Entered By: Gretta Cool, BSN, RN, CWS, Kim on 09/24/2018 08:26:14 [name] ([id number]) -------------------------------------------------------------------------------- Wound Assessment Details Patient Name: Marcus Loll T. Date of Service: 09/24/2018 8:15 AM Medical Record Number: [id number] Patient Account Number: [id number]  Date of Birth/Sex: 1951-01-30 (67 y.o. M) Treating RN: Cornell Barman Primary Care Cathy Crounse: BESS, KATY Other Clinician: Referring [place] Masaki: BESS, KATY Treating Rmani Kellogg/Extender: STONE III, HOYT Weeks in Treatment: 22 Wound Status Wound Number: 4 Primary Etiology: Trauma, Other Wound Location: Left, Lateral Lower Leg Wound Status: Open Wounding Event: Trauma Date Acquired: 09/07/2018 Weeks Of Treatment: 1 Clustered Wound: No Photos Photo Uploaded By: Gretta Cool, BSN, RN, CWS, Kim on 09/24/2018 09:19:58 Wound Measurements Length: (cm) 2.2 Width: (cm) 1.2 Depth: (cm) 0.1 Area: (cm) 2.073 Volume: (cm) 0.207 % Reduction in Area: -10% % Reduction in Volume: -10.1% Wound Description Full Thickness Without Exposed Support Classification: Structures Periwound Skin Texture Texture Color No Abnormalities Noted: No No Abnormalities Noted: No Moisture No Abnormalities Noted: No Treatment Notes Wound #4 (Left, Lateral Lower Leg) 1. Cleansed with: Clean wound with Normal Saline 2. Anesthetic Topical Lidocaine 4% cream to wound bed prior to debridement Mcdevitt, Huntley Dec ([id number]) 4. Dressing Applied: Prisma Ag Other dressing (specify in notes) Notes coverlet Electronic Signature(s) Signed: 09/24/2018 4:33:57 PM By: Gretta Cool, BSN, RN, CWS, Kim RN, BSN Entered By: Gretta Cool, BSN, RN, CWS, Kim on  09/24/2018 08:26:01 Calix, Huntley Dec ([id number]) -------------------------------------------------------------------------------- [place] Details Patient Name: [name]. Date of Service: 09/24/2018 8:15 AM Medical Record Number: [id number] Patient Account Number: [id number] Date of Birth/Sex: 10-Oct-1951 (67 y.o. M) Treating RN: Montey Hora Primary Care Ilyas Lipsitz: BESS, KATY Other Clinician: Referring Skilynn Durney: BESS, KATY Treating Jayani Rozman/Extender: STONE III, HOYT Weeks in Treatment: 22 Vital Signs Time Taken: 08:22 Temperature (F): 97.8 Height (in): 69 Pulse (bpm): 84 Weight (lbs): 238 Respiratory Rate (breaths/min): 16 Body Mass Index (BMI): 35.1 Blood Pressure (mmHg): 104/60 Reference Range: 80 - 120 mg / dl Electronic Signature(s) Signed: 09/24/2018 8:47:37 AM By: Lorine Bears RCP, RRT, CHT Entered By: Becky Sax, Amado Nash on 09/24/2018 08:24:00

## 2018-09-26 LAB — CUP PACEART REMOTE DEVICE CHECK
MDC IDC PG IMPLANT DT: 20170821
MDC IDC SESS DTM: 20191003003950

## 2018-09-26 MED FILL — OXYCODONE-ACETAMINOPHEN 5-3: 5-325 | 4 days supply | Qty: 21 | Fill #0

## 2018-09-26 NOTE — Progress Notes (Signed)
Carelink Summary Report / Loop Recorder 

## 2018-09-27 DIAGNOSIS — N200 Calculus of kidney: Secondary | ICD-10-CM | POA: Diagnosis not present

## 2018-09-30 ENCOUNTER — Ambulatory Visit: Payer: PPO | Admitting: Podiatry

## 2018-10-08 ENCOUNTER — Encounter: Payer: PPO | Admitting: Physician Assistant

## 2018-10-08 DIAGNOSIS — L97822 Non-pressure chronic ulcer of other part of left lower leg with fat layer exposed: Secondary | ICD-10-CM | POA: Diagnosis not present

## 2018-10-08 DIAGNOSIS — I872 Venous insufficiency (chronic) (peripheral): Secondary | ICD-10-CM | POA: Diagnosis not present

## 2018-10-11 NOTE — Progress Notes (Addendum)
JOSPH, NORFLEET ([id number]) Visit Report for 10/08/2018 Chief Complaint Document Details Patient Name: [name], [name]. Date of Service: 10/08/2018 8:30 AM Medical Record Number: [id number] Patient Account Number: [id number] Date of Birth/Sex: 1951/03/23 (67 y.o. M) Treating RN: Montey Hora Primary Care Provider: BESS, Valetta Fuller Other Clinician: Referring Provider: BESS, KATY Treating Provider/Extender: STONE III, Becca Bayne Weeks in Treatment: 24 Information Obtained from: Patient Chief Complaint Left lower leg and left great toe ulcer Electronic Signature(s) Signed: 10/09/2018 11:13:36 AM By: Worthy Keeler PA-C Entered By: Worthy Keeler on 10/08/2018 08:18:55 [name], [name] ([id number]) -------------------------------------------------------------------------------- Debridement Details Patient Name: [name]. Date of Service: 10/08/2018 8:30 AM Medical Record Number: [id number] Patient Account Number: [id number] Date of Birth/Sex: Jan 23, 1951 (67 y.o. M) Treating RN: Montey Hora Primary Care Provider: BESS, KATY Other Clinician: Referring Provider: BESS, KATY Treating Provider/Extender: STONE III, Tasmine Hipwell Weeks in Treatment: 24 Debridement Performed for Wound #4 Left,Lateral Lower Leg Assessment: Performed By: Physician STONE III, Tyreesha Maharaj E., PA-C Debridement Type: Debridement Level of Consciousness (Pre- Awake and Alert procedure): Pre-procedure Verification/Time Yes - 08:58 Out Taken: Start Time: 08:58 Pain Control: Lidocaine 4% Topical Solution Total Area Debrided (L x W): 0.2 (cm) x 0.3 (cm) = 0.06 (cm) Tissue and other material Viable, Non-Viable, Slough, Subcutaneous, Slough debrided: Level: Skin/Subcutaneous Tissue Debridement Description: Excisional Instrument: Curette Bleeding: Minimum Hemostasis Achieved: Pressure End Time: 09:00 Procedural Pain: 0 Post Procedural Pain: 0 Response to Treatment: Procedure was tolerated well Level of  Consciousness Awake and Alert (Post-procedure): Post Debridement Measurements of Total Wound Length: (cm) 0.6 Width: (cm) 0.6 Depth: (cm) 0.1 Volume: (cm) 0.028 Character of Wound/Ulcer Post Debridement: Improved Post Procedure Diagnosis Same as Pre-procedure Electronic Signature(s) Signed: 10/08/2018 5:26:03 PM By: Montey Hora Signed: 10/09/2018 11:13:36 AM By: Worthy Keeler PA-C Entered By: Montey Hora on 10/08/2018 08:59:11 [name], [name] ([id number]) -------------------------------------------------------------------------------- HPI Details Patient Name: [name]. Date of Service: 10/08/2018 8:30 AM Medical Record Number: [id number] Patient Account Number: [id number] Date of Birth/Sex: [birth year] (67 y.o. M) Treating RN: Montey Hora Primary Care Provider: BESS, KATY Other Clinician: Referring Provider: BESS, KATY Treating Provider/Extender: STONE III, Melika Reder Weeks in Treatment: 24 History of Present Illness Associated Signs and Symptoms: Patient has a history of chronic venous insufficiency, diabetes mellitus type II, its reform fraction, hypertension HPI Description: 04/19/18 on evaluation today patient presents initially concerning an ulcer of his left great toe and left anterior shin. He states that the shin was definitely a trauma injury where he struck this on a metal pole injuring leg. In regard to the distal left great toe he's really not sure what happened here although he does have a significant callous noted as well. He does have a pacemaker. Fortunately his ABI was 0.98 on the right and 0.83 on the left this appears to be doing fairly well. Overall I'm pleased with that. Nonetheless he states that this really has not seem to be improving. He has been tolerating the dressing changes without complication. The left anterior leg ulcer began on 04/05/18 according to what he tells [place] today although one note I did have for review said it was April 5. I'm  inclined to believe it was the fifth she did have an office visit on 04/01/18 with his family nurse practitioner Faith Rogue. Nonetheless either way it's been going on this month for several weeks. The left great toe has actually been present since January 1 he does not know how this occurred. Fortunately has no pain at the site  although he does have pain on the left anterior shin. His hemoglobin A1c is 8.0 in March 2019. 04/26/18 on evaluation today patient presents for follow-up concerning his life into your lower [place] also as well as his left great toe also. Fortunately the shin actually appears to be doing better on evaluation today in my opinion I'm not seeing as much in the way of fluffernutter on the surface although it does still require debridement today this is improved. Size wise it's really not much different. With that being said the total ulcer actually appears to be a little bit more macerated today I'm not sure why they state that several days ago when this was changed that was not the case. Fortunately is not having severe pain although both areas do hurt the shin is worse. 05/03/18 on evaluation today patient appears to be doing better in regard to his left first toe and left shin ulcers. He is been tolerating the dressing changes without complication and the good news is this seems to be showing signs of getting better. Overall I'm very pleased with the progress that has been made over the past week. Patient is still having pain especially in regard to the left shin. 05/10/18 on evaluation today patient appears to be doing rather well in regard to his two ulcers. The toe ulcer appears to be smaller he does have some undermining however in this had to be cleaned out just a little bit. With that being said overall I feel like this is showing signs of improvement. The left anterior shin ulcer also showing signs of improvement at this time. There does not appear to be any evidence of  infection which is good news. 05/21/18 on evaluation today patient appears to be doing very well in regard to his left shin ulcer. He has been tolerating the dressing changes without complication. With that being said he is having issues at this point with his great toe on the left. We have not x-rayed that at this point I think we may need to. Nonetheless he has pain really at the roughly 5 o'clock location but nowhere else and I really cannot explain this I do not see any obvious form body. Nonetheless he continues to have issues with getting this area to close it's not progressing as nicely. 05/28/18 on evaluation today patient appears to be doing very well in regard to his left lower extremity anterior ulcer. He has been tolerating the dressing changes without complication. Overall this is making excellent progress with the collagen dressing. His left great toe x-ray did return and showed that he had no if you that normality. He did have a plantar heel spur but this is more degenerative. Otherwise just arthritis was noted no osteomyelitis and no obvious form body was visualized. Overall things seem to be going fairly well. With that being said he still has pain when looking at the toe from the plantar aspect at roughly the six back to the 4 o'clock location which is not quite as bad as last time but still hurts more than any other part of his wound. It does appear the Iodoflex has been of benefit however. 06/04/18 on evaluation today patient appears to be doing excellent in regard to his left anterior shin ulcer. This has made excellent progress and is showing signs of improving in fact very close to completely closing in healing. Nonetheless unfortunately his left great toe has not faired quite as well as far as progress is concerned. He  notes that after last week's Witherspoon, BENJAMINE STROUT. ([id number]) debridement he actually seem to be doing a little bit better for a couple of days until Thursday of last  week when he had increases in discomfort as well is changes in the draining. He states that the drainage has been more green in color according to his wife and what he has seen. He's also had more discomfort. He relates to me again that he really feels like this all started when he stepped on a nail although he does not exactly remember it it seemed to be a puncture wound that he feels may be the culprit for why all this started. Nonetheless I am getting more concerned about the possibility of osteomyelitis despite the fact that the x-ray was negative I was hopeful in this interim since last week to this week that we would see some improvement in the overall appearance of the toe. Unfortunately if anything I feel like it's a little worse. It was macerated but I think we can attribute this to the fact that the patient did have an episode last night we got stuck in the rain and he thinks the dressing got wet. He did not change it I told him in the future if this were to happen to please go ahead and change the dressing it won'[name] hurt to change it more frequently in that situation. Otherwise other than the maceration I do not see any evidence of anything being any worse but it also does not appear to be any better. 06/11/18 on evaluation today patient's wounds both the great toe as well as the left shin both on the left side show signs of good improvement. Fortunately the patient's culture came back negative in regard to the toe and there does not appear to be any evidence of infection. His MRI is actually scheduled for 06/19/18. Fortunately this is right before I see him next week and then we can go to the results of the culture at that point. Patient is in agreement with that plan. With that being said otherwise he has been showing signs of having less pain in regard to the toe which is also good news my hope is that he does not have osteomyelitis. 06/18/18 on evaluation today patient appears to be doing  very well in regard to his left lower extremity ulcer. He also states at this point in time that he has been tolerating the dressing changes with the toe it appears also to be doing very well at this point. In general I'm happy with the progress that he seems to be making. With that being said he still continues to have pain in regard to the great toe ulcer fortunately this does not seem to be as significant as it was in the beginning but still nonetheless he is having some pain. No fevers, chills, nausea, or vomiting noted at this time. 06/25/18 on evaluation today patient actually appears to be doing very well in regard to his left anterior shin ulcer. He's been tolerating the dressing changes without complication in this seems to have done extremely well. In fact this appears to be healed. With that being said his toe ulcer still continues to get in trouble fortunately not as much as has been in the past and there have been some signs of improvement and better granulation although I still think he's getting a lot of callous to the area which seems to indicate a lot of pressure and friction. I believe he  may benefit from a total contact cast especially now that the left shin ulcer has healed. 07/02/18 on evaluation today patient appears to be doing rather well in regard to his left anterior shin ulcer which is actually completely healed at this point. With that being said he has left great toe ulcer actually appears to be doing better he states over the weekend he actually had a significant amount of drainage from the toe after it had been hurting for some time. He states when his wife change the dressing there was a lot of discharge and fluid he actually took a picture he showed me today. Since that time he has not noted as much tenderness he feels like something may have "popped out" 07/09/18 on evaluation today patient appears to be doing about the same in regard to his toe ulcer. He really has not  shown any signs of significant improvement this seems to just be maintaining at best. He continues to be extremely active in fact I think this may be the biggest issue that he continues to do things as normal in fact right now he's remodeling the bathroom. Obviously I think he is a very active individual and as previously discussed I think a total contact cast would be of great benefit for him. This was discussed with patient yet again today I think we are gonna proceed in this regard. 07/12/18 patient presents today for his first total contact cast change. He has had this on for three days and states that though he felt like it may be rubbing a little bit around his ankles there does not appear to be any injury at this point. Overall the wound actually looks better in my pinion. 07/16/18 on evaluation today patient's wound actually appears to be doing much better at this point. He has been tolerating the dressing changes without complication. With that being said I do believe the total contact cast has been of benefit he states he is having much less discomfort that he previously had. Overall I'm very pleased with how things have progressed in just one weeks worth of the total contact cast. 07/23/18 on evaluation today patient's toe ulcer on the left great toe actually appears to be showing signs of improvement which is good news. In general I'm very pleased with the progress that he has made in the cast over the past two weeks. Fortunately there does not appear to be any evidence of infection at this time. He also is not having as much maceration as was previously noted. The patient is happy in this regard that he notes that the boot and cast is not the most comfortable thing in the world. 07/30/18-He is here in follow-up evaluation for left great toe ulcer. He presents in a total contact cast, after removal he is noted to be macerated with new injury to the dorsum of his left foot; he continues to  work outside and this is most likely secondary to [name], [name]. ([id number]) excessive sweating versus wet from a shower. We will hold off on reapplying total contact cast, allowing the dorsal foot injury to resolve and allowing for complete drying as he will continue to work outside. He will have a nurse visit next Monday to evaluate dorsal foot partial thickness ulcer and moisture/maceration to foot and follow-up in 2 weeks 08/13/18 on evaluation today patient's toe ulcer actually appears to be doing about the same. Fortunately the dorsal foot ulcer has healed and his foot is not appearing to  be as macerated. He continues to be overly active in my pinion in regard to the remodeling of his home he states he was actually finishing up some plumbing yesterday. Nonetheless I think that right now being outside the majority the day in a total contact cast in roughly 90 to 100o weather has been counterproductive not helpful for them in regard to healing the wound. I explained to him that if we're gonna get this to heal he may need to drop back a bit and take it easy to allow this to heal. He states that's not something is gonna likely be able to do. 08/20/18 on evaluation today patient actually appears to be doing a little better in regard to his toe ulcer. He's been tolerating the dressing changes much better he did not tolerate the front offloading shoe however. He states it actually caused his left knee to hurt that something that is been told before he probably needs replacement nonetheless he wasn'[name] able to where the shoe past Friday so really only for about three days or so. Nonetheless we have not tried a PEG assist offloading shoe. I think this may be something that we could try for him. In general I do feel like the wound size is slightly smaller in particular in regard to the area of undermining. 08/27/18 on evaluation today patient actually appears to be doing possibly a bit better in regard to  his left great toe ulcer. He's been tolerating the dressing changes without complication. With that being said the patient actually does show evidence of improvement although there is still an opening of the ulcer down to the bone. He is previously tested negative for osteomyelitis by way of MRI. There does not appear to be necrotic bone noted in the base of the wound. 09/03/18 on evaluation today patient actually appears to be doing slightly better in regard to the overall size of the wound although the depth is still just as significant. He does have bone noted in the base of the wound still unfortunately. He's not having as much discomfort which is good news. Again the bone does not appear to be necessarily necrotic. Overall I am happy with the appearance but nonetheless still not pleased with the fact that this has not gotten significantly better in the past week. He has been tolerating the dressing changes without complication. He still continues to remodel his house he states these get ready to finish the drywall and then paint. After that he states he may need to do some fishing. I think fishing can actually be beneficial for him I'm more concerned about the fact that if he does not back off as far as movement and getting around in general that is gonna end up with something more significant going on. Obviously he had no osteomyelitis previous but that could change in the future. 09/06/18 unfortunately we had to see the patient today for early follow-up due to the fact that he was having increased discomfort after having seen me on Tuesday. He states that even towards Tuesday afternoon and especially in the Wednesday he had a lot of pain due to the fact that he had passed buildup in the toe. The only thing we change was adding the Prisma to be packed into the base of the wound in order to try to help with epithelialization. With that being said what I think may have happened is that he is  continuing to have some kind of infection which is draining. This  may even be a bone infection in them ride the negative when we previously performed this may have been too early to detect. Nonetheless it does seem that he clapped and fluid which filled up calls pressure and then subsequently as it started being squeezed in pushed out got a little bit better but in general he discontinued using the Prisma and things have seemed to improve over the last 24 hours. He's just using the silver alginate which has been better for him in general mainly due to the fact likely that it's helping to drain the excess fluid this drain. Fortunately there does not appear to be any evidence of worsening infection spreading up his foot. 09/17/18 on evaluation today patient actually appears to be doing about the same in regard to his left great toe. With that being said he does not seem to show any evidence of infection at this time which is great news. He's also not having any excess fluid or moisture buildup which is also great news. In general I think this is stable but I'm still concerned about the fact it does not seem to be improving as well as I would like. Nonetheless he does have an appointment with try and foot on October 7. He does have a new wound on the left lateral lower extremity unfortunately this is due to a sheet-metal injury he tells me. This happened several days ago. This is causing a little bit of discomfort fortunately there does not appear to be any evidence of infection there is some Slough on the surface of this wound. Currently patient unfortunately is not wearing the offloading shoe that we have provided him at this time. In fact we have tried multiple ways of offloading his wound including a total contact cast which he was outside in so much actually caused more maceration due to the fact that he continues to work on remodeling his bathroom. I've also attempted front offloading shoe which  she states he only wears if he's going out somewhere such as out to eat he does not wear it when he's around in the house. Nonetheless everything that I've attempted from the offloading standpoint really does not seem to be something that he is able or willing to use unfortunately. [name], [name] ([id number]) 09/24/18 on evaluation today patient actually appears to be doing very well in regard to his great toe ulcer on the left foot. In fact this actually appears to be doing very well at this time. He is having no pain whatsoever and it in fact is the best of seeing it up to this point. 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 Nonetheless overall I feel like things are shown signs of great improvement and really there's not even much that we can pack into the wound at this point. In regard to the left lateral lower extremity this appears to be showing signs of improvement as well. 10/08/18 on evaluation today patient actually appears to be doing rather well in regard to his toe ulcer. In fact this appears to be completely healed at this point today. In regard to his lower extremity ulcer he has shown signs of significant improvement which is great news but this is not completely closed as of yet we are getting very close in that regard. Overall  I feel like he's doing the best that I've seen him do for quite some time. He never did go see the podiatrist as the wound was doing so well he did not feel that was necessary that seems to have been an appropriate move. Electronic Signature(s) Signed: 10/10/2018 5:08:41 PM By: Worthy Keeler PA-C Entered By: Worthy Keeler on 10/10/2018 10:15:05 Jiron, [name] ([id number]) -------------------------------------------------------------------------------- Physical Exam Details Patient Name:  [name]. Date of Service: 10/08/2018 8:30 AM Medical Record Number: [id number] Patient Account Number: [id number] Date of Birth/Sex: 06/13/1951 (67 y.o. M) Treating RN: Montey Hora Primary Care Provider: BESS, Valetta Fuller Other Clinician: Referring Provider: BESS, KATY Treating Provider/Extender: STONE III, Daira Hine Weeks in Treatment: 10 Constitutional Well-nourished and well-hydrated in no acute distress. Respiratory normal breathing without difficulty. Psychiatric this patient is able to make decisions and demonstrates good insight into disease process. Alert and Oriented x 3. pleasant and cooperative. Notes Patient's wound bed currently did require some sharp debridement in regard to lower extremity wound to clear away surface slough. He tolerated this without complication post debridement the wound bed appears to be doing much better. Electronic Signature(s) Signed: 10/10/2018 5:08:41 PM By: Worthy Keeler PA-C Entered By: Worthy Keeler on 10/10/2018 10:15:32 Witt, [name] ([id number]) -------------------------------------------------------------------------------- Physician Orders Details Patient Name: [name]. Date of Service: 10/08/2018 8:30 AM Medical Record Number: [id number] Patient Account Number: [id number] Date of Birth/Sex: [birth year] (67 y.o. 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 o Cleanse wound with mild soap and water Anesthetic (add to Medication List) Wound #4 Left,Lateral Lower Leg o Topical Lidocaine 4% cream applied to wound bed prior to debridement (In Clinic Only). Primary Wound Dressing Wound #4 Left,Lateral Lower Leg o Silver Alginate Secondary Dressing Wound #4 Left,Lateral Lower Leg o Other - coverlet or bandaid Dressing Change Frequency Wound #4 Left,Lateral Lower Leg o Change dressing every other day. Follow-up Appointments Wound #4 Left,Lateral Lower Leg o Return Appointment in 2 weeks. Off-Loading Wound #4 Left,Lateral Lower Leg o Open toe surgical shoe with peg assist. o Other: - keep pressure off of area Additional Orders / Instructions Googe, MARTAVIOUS HARTEL ([id number]) Wound #4 Left,Lateral Lower Leg o Stop Smoking o Vitamin A; Vitamin C, Zinc - Please add a multivitamin with 100% of these o Increase protein intake. o Activity as tolerated Electronic Signature(s) Signed: 10/08/2018 5:26:03 PM By: Montey Hora Signed: 10/09/2018 11:13:36 AM By: Worthy Keeler PA-C Entered By: Montey Hora on 10/08/2018 09:00:13 Chandley, [name] ([id number]) -------------------------------------------------------------------------------- Problem List Details Patient Name: [name]. Date of Service: 10/08/2018 8:30 AM Medical Record Number: [id number] Patient Account Number: [id number] Date of Birth/Sex: 02/01/1951 (67 y.o. 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 04/19/2018 No Yes involvement without evidence of necrosis S81.802A Unspecified open wound, left lower leg, initial encounter 09/17/2018 No Yes I63.9 Cerebral infarction, unspecified 04/19/2018 No Yes I10 Essential (primary) hypertension 04/19/2018 No Yes Inactive Problems Resolved Problems ICD-10 Code Description Active Date Resolved Date S81.812A Laceration without foreign body, left lower leg, initial encounter 04/19/2018 04/19/2018 I50.277 Non-pressure chronic ulcer of other part of left lower leg with fat 04/19/2018 04/19/2018 layer exposed Electronic Signature(s) Signed: 10/09/2018 11:13:36 AM By: Ralene Bathe ([id number]) Entered By: Worthy Keeler on 10/08/2018 08:18:40 Tamburo, [name] ([id number]) -------------------------------------------------------------------------------- Progress Note Details Patient Name: [name]. Date of Service: 10/08/2018 8:30 AM Medical Record Number: [id number] Patient Account Number: [id number] Date of Birth/Sex: 22-Oct-1951 (67 y.o. M) Treating RN: Montey Hora Primary Care Provider: BESS, KATY Other Clinician: Referring Provider: BESS, KATY Treating Provider/Extender: Melburn Hake, Ritha Sampedro Weeks in Treatment: 24 Subjective Chief Complaint Information obtained from Patient Left lower leg and left great toe ulcer History of Present Illness (HPI) The following HPI elements were documented for the patient's wound: Associated Signs and Symptoms: Patient has a history of chronic venous insufficiency, diabetes mellitus type II, its reform fraction, hypertension 04/19/18 on evaluation today patient presents initially concerning an ulcer of his left great toe and left anterior shin. He states that the shin was definitely a trauma injury where he struck this on a metal pole injuring leg. In regard to the distal left great toe he's really not sure what happened here  although he does have a significant callous noted as well. He does have a pacemaker. Fortunately his ABI was 0.98 on the right and 0.83 on the left this appears to be doing fairly well. Overall I'm pleased with that. Nonetheless he states that this really has not seem to be improving. He has been tolerating the dressing changes without complication. The left anterior leg ulcer began on 04/05/18 according to what he tells [place] today although one note I did have for review said it was April 5. I'm inclined to believe it was the fifth she did have an office visit on 04/01/18 with his family nurse practitioner Faith Rogue. Nonetheless either way it's been going on this month for several weeks. The left great toe has actually been present since January 1 he does not know how this occurred. Fortunately has no pain at the site although he does have pain on the left anterior shin. His hemoglobin A1c is 8.0 in March 2019. 04/26/18 on evaluation today patient presents for follow-up concerning his life into your lower [place] also as well as his left great toe also. Fortunately the shin actually appears to be doing better on evaluation today in my opinion I'm not seeing as much in the way of fluffernutter on the surface although it does still require debridement today this is improved. Size wise it's really not much different. With that being said the total ulcer actually appears to be a little bit more macerated today I'm not sure why they state that several days ago when this was changed that was not the case. Fortunately is not having severe pain although both areas do hurt the shin is worse. 05/03/18 on evaluation today patient appears to be doing better in regard to his left first toe and left shin ulcers. He is been tolerating the dressing changes without complication and the good news is this seems  to be showing signs of getting better. Overall I'm very pleased with the progress that has been made over the  past week. Patient is still having pain especially in regard to the left shin. 05/10/18 on evaluation today patient appears to be doing rather well in regard to his two ulcers. The toe ulcer appears to be smaller he does have some undermining however in this had to be cleaned out just a little bit. With that being said overall I feel like this is showing signs of improvement. The left anterior shin ulcer also showing signs of improvement at this time. There does not appear to be any evidence of infection which is good news. 05/21/18 on evaluation today patient appears to be doing very well in regard to his left shin ulcer. He has been tolerating the dressing changes without complication. With that being said he is having issues at this point with his great toe on the left. We have not x-rayed that at this point I think we may need to. Nonetheless he has pain really at the roughly 5 o'clock location but nowhere else and I really cannot explain this I do not see any obvious form body. Nonetheless he continues to have issues with getting this area to close it's not progressing as nicely. 05/28/18 on evaluation today patient appears to be doing very well in regard to his left lower extremity anterior ulcer. He has been tolerating the dressing changes without complication. Overall this is making excellent progress with the collagen dressing. His left great toe x-ray did return and showed that he had no if you that normality. He did have a plantar heel spur Bolar, Anyelo [name]. ([id number]) but this is more degenerative. Otherwise just arthritis was noted no osteomyelitis and no obvious form body was visualized. Overall things seem to be going fairly well. With that being said he still has pain when looking at the toe from the plantar aspect at roughly the six back to the 4 o'clock location which is not quite as bad as last time but still hurts more than any other part of his wound. It does appear the Iodoflex  has been of benefit however. 06/04/18 on evaluation today patient appears to be doing excellent in regard to his left anterior shin ulcer. This has made excellent progress and is showing signs of improving in fact very close to completely closing in healing. Nonetheless unfortunately his left great toe has not faired quite as well as far as progress is concerned. He notes that after last week's debridement he actually seem to be doing a little bit better for a couple of days until Thursday of last week when he had increases in discomfort as well is changes in the draining. He states that the drainage has been more green in color according to his wife and what he has seen. He's also had more discomfort. He relates to me again that he really feels like this all started when he stepped on a nail although he does not exactly remember it it seemed to be a puncture wound that he feels may be the culprit for why all this started. Nonetheless I am getting more concerned about the possibility of osteomyelitis despite the fact that the x-ray was negative I was hopeful in this interim since last week to this week that we would see some improvement in the overall appearance of the toe. Unfortunately if anything I feel like it's a little worse. It was macerated but I think we  can attribute this to the fact that the patient did have an episode last night we got stuck in the rain and he thinks the dressing got wet. He did not change it I told him in the future if this were to happen to please go ahead and change the dressing it won'[name] hurt to change it more frequently in that situation. Otherwise other than the maceration I do not see any evidence of anything being any worse but it also does not appear to be any better. 06/11/18 on evaluation today patient's wounds both the great toe as well as the left shin both on the left side show signs of good improvement. Fortunately the patient's culture came back negative in  regard to the toe and there does not appear to be any evidence of infection. His MRI is actually scheduled for 06/19/18. Fortunately this is right before I see him next week and then we can go to the results of the culture at that point. Patient is in agreement with that plan. With that being said otherwise he has been showing signs of having less pain in regard to the toe which is also good news my hope is that he does not have osteomyelitis. 06/18/18 on evaluation today patient appears to be doing very well in regard to his left lower extremity ulcer. He also states at this point in time that he has been tolerating the dressing changes with the toe it appears also to be doing very well at this point. In general I'm happy with the progress that he seems to be making. With that being said he still continues to have pain in regard to the great toe ulcer fortunately this does not seem to be as significant as it was in the beginning but still nonetheless he is having some pain. No fevers, chills, nausea, or vomiting noted at this time. 06/25/18 on evaluation today patient actually appears to be doing very well in regard to his left anterior shin ulcer. He's been tolerating the dressing changes without complication in this seems to have done extremely well. In fact this appears to be healed. With that being said his toe ulcer still continues to get in trouble fortunately not as much as has been in the past and there have been some signs of improvement and better granulation although I still think he's getting a lot of callous to the area which seems to indicate a lot of pressure and friction. I believe he may benefit from a total contact cast especially now that the left shin ulcer has healed. 07/02/18 on evaluation today patient appears to be doing rather well in regard to his left anterior shin ulcer which is actually completely healed at this point. With that being said he has left great toe ulcer actually  appears to be doing better he states over the weekend he actually had a significant amount of drainage from the toe after it had been hurting for some time. He states when his wife change the dressing there was a lot of discharge and fluid he actually took a picture he showed me today. Since that time he has not noted as much tenderness he feels like something may have "popped out" 07/09/18 on evaluation today patient appears to be doing about the same in regard to his toe ulcer. He really has not shown any signs of significant improvement this seems to just be maintaining at best. He continues to be extremely active in fact I think this may  be the biggest issue that he continues to do things as normal in fact right now he's remodeling the bathroom. Obviously I think he is a very active individual and as previously discussed I think a total contact cast would be of great benefit for him. This was discussed with patient yet again today I think we are gonna proceed in this regard. 07/12/18 patient presents today for his first total contact cast change. He has had this on for three days and states that though he felt like it may be rubbing a little bit around his ankles there does not appear to be any injury at this point. Overall the wound actually looks better in my pinion. 07/16/18 on evaluation today patient's wound actually appears to be doing much better at this point. He has been tolerating the dressing changes without complication. With that being said I do believe the total contact cast has been of benefit he states he is having much less discomfort that he previously had. Overall I'm very pleased with how things have progressed in just one weeks worth of the total contact cast. [name], [name] ([id number]) 07/23/18 on evaluation today patient's toe ulcer on the left great toe actually appears to be showing signs of improvement which is good news. In general I'm very pleased with the progress  that he has made in the cast over the past two weeks. Fortunately there does not appear to be any evidence of infection at this time. He also is not having as much maceration as was previously noted. The patient is happy in this regard that he notes that the boot and cast is not the most comfortable thing in the world. 07/30/18-He is here in follow-up evaluation for left great toe ulcer. He presents in a total contact cast, after removal he is noted to be macerated with new injury to the dorsum of his left foot; he continues to work outside and this is most likely secondary to excessive sweating versus wet from a shower. We will hold off on reapplying total contact cast, allowing the dorsal foot injury to resolve and allowing for complete drying as he will continue to work outside. He will have a nurse visit next Monday to evaluate dorsal foot partial thickness ulcer and moisture/maceration to foot and follow-up in 2 weeks 08/13/18 on evaluation today patient's toe ulcer actually appears to be doing about the same. Fortunately the dorsal foot ulcer has healed and his foot is not appearing to be as macerated. He continues to be overly active in my pinion in regard to the remodeling of his home he states he was actually finishing up some plumbing yesterday. Nonetheless I think that right now being outside the majority the day in a total contact cast in roughly 90 to 100 weather has been counterproductive not helpful for them in regard to healing the wound. I explained to him that if we're gonna get this to heal he may need to drop back a bit and take it easy to allow this to heal. He states that's not something is gonna likely be able to do. 08/20/18 on evaluation today patient actually appears to be doing a little better in regard to his toe ulcer. He's been tolerating the dressing changes much better he did not tolerate the front offloading shoe however. He states it actually caused his left knee to  hurt that something that is been told before he probably needs replacement nonetheless he wasn'[name] able to where the shoe past Friday  so really only for about three days or so. Nonetheless we have not tried a PEG assist offloading shoe. I think this may be something that we could try for him. In general I do feel like the wound size is slightly smaller in particular in regard to the area of undermining. 08/27/18 on evaluation today patient actually appears to be doing possibly a bit better in regard to his left great toe ulcer. He's been tolerating the dressing changes without complication. With that being said the patient actually does show evidence of improvement although there is still an opening of the ulcer down to the bone. He is previously tested negative for osteomyelitis by way of MRI. There does not appear to be necrotic bone noted in the base of the wound. 09/03/18 on evaluation today patient actually appears to be doing slightly better in regard to the overall size of the wound although the depth is still just as significant. He does have bone noted in the base of the wound still unfortunately. He's not having as much discomfort which is good news. Again the bone does not appear to be necessarily necrotic. Overall I am happy with the appearance but nonetheless still not pleased with the fact that this has not gotten significantly better in the past week. He has been tolerating the dressing changes without complication. He still continues to remodel his house he states these get ready to finish the drywall and then paint. After that he states he may need to do some fishing. I think fishing can actually be beneficial for him I'm more concerned about the fact that if he does not back off as far as movement and getting around in general that is gonna end up with something more significant going on. Obviously he had no osteomyelitis previous but that could change in the future. 09/06/18  unfortunately we had to see the patient today for early follow-up due to the fact that he was having increased discomfort after having seen me on Tuesday. He states that even towards Tuesday afternoon and especially in the Wednesday he had a lot of pain due to the fact that he had passed buildup in the toe. The only thing we change was adding the Prisma to be packed into the base of the wound in order to try to help with epithelialization. With that being said what I think may have happened is that he is continuing to have some kind of infection which is draining. This may even be a bone infection in them ride the negative when we previously performed this may have been too early to detect. Nonetheless it does seem that he clapped and fluid which filled up calls pressure and then subsequently as it started being squeezed in pushed out got a little bit better but in general he discontinued using the Prisma and things have seemed to improve over the last 24 hours. He's just using the silver alginate which has been better for him in general mainly due to the fact likely that it's helping to drain the excess fluid this drain. Fortunately there does not appear to be any evidence of worsening infection spreading up his foot. 09/17/18 on evaluation today patient actually appears to be doing about the same in regard to his left great toe. With that being said he does not seem to show any evidence of infection at this time which is great news. He's also not having any excess fluid or moisture buildup which is also great news. In  general I think this is stable but I'm still concerned about the fact it does not seem to be improving as well as I would like. Nonetheless he does have an appointment with try and foot on October 7. He does have a new wound on the left lateral lower extremity unfortunately this is due to a sheet-metal injury he tells me. This happened several days ago. This is causing a little bit of  discomfort fortunately there does not appear to be any evidence of infection there is some Slough on the surface of this wound. KMARI, HALTER ([id number]) Currently patient unfortunately is not wearing the offloading shoe that we have provided him at this time. In fact we have tried multiple ways of offloading his wound including a total contact cast which he was outside in so much actually caused more maceration due to the fact that he continues to work on remodeling his bathroom. I've also attempted front offloading shoe which she states he only wears if he's going out somewhere such as out to eat he does not wear it when he's around in the house. Nonetheless everything that I've attempted from the offloading standpoint really does not seem to be something that he is able or willing to use unfortunately. 09/24/18 on evaluation today patient actually appears to be doing very well in regard to his great toe ulcer on the left foot. In fact this actually appears to be doing very well at this time. He is having no pain whatsoever and it in fact is the best of seeing it up to this point. 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 Nonetheless overall I feel like things are shown signs of great improvement and really there's not even much that we can pack into the wound at this point. In regard to the left lateral lower extremity this appears to be showing signs of improvement as well. 10/08/18 on evaluation today patient actually appears to be doing rather well in regard to his toe ulcer. In fact this appears to be completely healed at this point today. In regard to his lower extremity ulcer he has shown signs of significant improvement which is great news but this is not completely closed as of yet we are getting very close in that regard.  Overall I feel like he's doing the best that I've seen him do for quite some time. He never did go see the podiatrist as the wound was doing so well he did not feel that was necessary that seems to have been an appropriate move. Patient History Information obtained from Patient. Family History Cancer - Father, Diabetes - Father, Heart Disease - Mother,Father, Hypertension - Mother, Kidney Disease - Child, No family history of Lung Disease, Seizures, Stroke, Thyroid Problems, Tuberculosis. Social History Current every day smoker - 1 - 1/2 pack daily, Marital Status - Married, Alcohol Use - Rarely, Drug Use - No History, Caffeine Use - Daily - coffee. Review of Systems (ROS) Constitutional Symptoms (General Health) Denies complaints or symptoms of Fever, Chills. Respiratory The patient has no complaints or symptoms. Cardiovascular The patient has no complaints or symptoms. Psychiatric The patient has no complaints or symptoms. Objective Constitutional Well-nourished and well-hydrated in no acute distress. Vitals Time Taken: 8:23 AM, Height: 69 in, Weight: 238 lbs, BMI: 35.1, Temperature:  97.8 F, Pulse: 64 bpm, Respiratory Rate: 16 breaths/min, Blood Pressure: 130/62 mmHg. [name], [name] ([id number]) Respiratory normal breathing without difficulty. Psychiatric this patient is able to make decisions and demonstrates good insight into disease process. Alert and Oriented x 3. pleasant and cooperative. General Notes: Patient's wound bed currently did require some sharp debridement in regard to lower extremity wound to clear away surface slough. He tolerated this without complication post debridement the wound bed appears to be doing much better. Integumentary (Hair, Skin) Wound #2 status is Healed - Epithelialized. Original cause of wound was Trauma. The wound is located on the Left Toe Great. The wound measures 0cm length x 0cm width x 0cm depth; 0cm^2 area and 0cm^3 volume. There  is Fat Layer (Subcutaneous Tissue) Exposed exposed. There is no tunneling or undermining noted. There is a none present amount of drainage noted. The wound margin is flat and intact. There is no granulation within the wound bed. There is a medium (34- 66%) amount of necrotic tissue within the wound bed including Adherent Slough. The periwound skin appearance exhibited: Callus. The periwound skin appearance did not exhibit: Crepitus, Excoriation, Induration, Rash, Scarring, Dry/Scaly, Maceration, Atrophie Blanche, Cyanosis, Ecchymosis, Hemosiderin Staining, Mottled, Pallor, Rubor, Erythema. Periwound temperature was noted as No Abnormality. The periwound has tenderness on palpation. Wound #4 status is Open. Original cause of wound was Trauma. The wound is located on the Left,Lateral Lower Leg. The wound measures 0.2cm length x 0.3cm width x 0.1cm depth; 0.047cm^2 area and 0.005cm^3 volume. There is Fat Layer (Subcutaneous Tissue) Exposed exposed. There is no tunneling or undermining noted. There is a small amount of serous drainage noted. The wound margin is flat and intact. There is large (67-100%) pink granulation within the wound bed. There is a small (1-33%) amount of necrotic tissue within the wound bed including Adherent Slough. The periwound skin appearance did not exhibit: Callus, Crepitus, Excoriation, Induration, Rash, Scarring, Dry/Scaly, Maceration, Atrophie Blanche, Cyanosis, Ecchymosis, Hemosiderin Staining, Mottled, Pallor, Rubor, Erythema. Periwound temperature was noted as No Abnormality. 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 There was a Excisional Skin/Subcutaneous Tissue Debridement with a total area of 0.06 sq cm performed by STONE III, Anaira Seay E., PA-C. With the following instrument(s): Curette to remove Viable and Non-Viable tissue/material. Material removed includes Subcutaneous Tissue and Slough and after achieving pain control using Lidocaine 4% Topical Solution. No specimens were taken. A time Cossin, OLUWADAMILOLA ROSAMOND. ([id number]) out was conducted at 08:58, prior to the start of the procedure. A Minimum amount of bleeding was controlled with Pressure. The procedure was tolerated well with a pain level of 0 throughout and a pain level of 0 following the procedure. Post Debridement Measurements: 0.6cm length x 0.6cm width x 0.1cm depth; 0.028cm^3 volume. Character of Wound/Ulcer Post Debridement is improved. Post procedure Diagnosis Wound #4: Same as Pre-Procedure Plan Wound Cleansing: Wound #4 Left,Lateral Lower Leg: Clean wound with Normal Saline. Cleanse wound with mild soap and water Anesthetic (add to Medication List): Wound #4 Left,Lateral Lower Leg: Topical Lidocaine 4% cream applied to wound bed prior to debridement (In Clinic Only). Primary Wound Dressing: Wound #4 Left,Lateral Lower Leg: Silver Alginate Secondary Dressing:  Wound #4 Left,Lateral Lower Leg: Other - coverlet or bandaid Dressing Change Frequency: Wound #4 Left,Lateral Lower Leg: Change dressing every other day. Follow-up Appointments: Wound #4 Left,Lateral Lower Leg: Return Appointment in 2 weeks. Off-Loading: Wound #4 Left,Lateral Lower Leg: Open toe surgical shoe with peg assist. Other: - keep pressure off of area Additional Orders / Instructions: Wound #4 Left,Lateral Lower Leg: Stop Smoking Vitamin A; Vitamin C, Zinc - Please add a multivitamin with 100% of these Increase protein intake. Activity as tolerated I'm in a recommend that we continue with the  above wound care measures for the next two weeks. The patient is in agreement with plan. We will subsequently see were things stand at follow-up. Please see above for specific wound care orders. We will see patient for re-evaluation in 2 week(s) here in the clinic. If anything worsens or changes patient will contact our office for additional recommendations. Electronic Signature(s) Signed: 10/10/2018 5:08:41 PM By: Worthy Keeler PA-C Entered By: Worthy Keeler on 10/10/2018 10:15:48 [place], [name] ([id number]) Inscoe, [name] ([id number]) -------------------------------------------------------------------------------- ROS/PFSH Details Patient Name: [name]. Date of Service: 10/08/2018 8:30 AM Medical Record Number: [id number] Patient Account Number: [id number] Date of Birth/Sex: 11/30/1951 (67 y.o. M) Treating RN: Montey Hora Primary Care Provider: BESS, KATY Other Clinician: Referring Provider: BESS, KATY Treating Provider/Extender: STONE III, Jceon Alverio Weeks in Treatment: 24 Information Obtained From Patient Wound History Do you currently have one or more open woundso Yes How many open wounds do you currently haveo 2 Approximately how long have you had your woundso 2 weeks How have you been treating your wound(s) until nowo yes Doctor Has your wound(s) ever healed and then re-openedo No Have you had any lab work done in the past montho No Have you tested positive for an antibiotic resistant organism (MRSA, VRE)o No Have you tested positive for osteomyelitis (bone infection)o No Have you had any tests for circulation on your legso No Have you had other problems associated with your woundso Infection, Swelling Constitutional Symptoms (General Health) Complaints and Symptoms: Negative for: Fever; Chills Eyes Medical History: Negative for: Cataracts; Glaucoma; Optic Neuritis Ear/Nose/Mouth/Throat Medical History: Negative for: Chronic sinus  problems/congestion Hematologic/Lymphatic Medical History: Positive for: Lymphedema Negative for: Anemia; Hemophilia; Human Immunodeficiency Virus; Sickle Cell Disease Respiratory Complaints and Symptoms: No Complaints or Symptoms Medical History: Negative for: Aspiration; Asthma; Chronic Obstructive Pulmonary Disease (COPD); Pneumothorax; Sleep Apnea; Tuberculosis Cardiovascular Complaints and Symptoms: No Complaints or Symptoms Medical History: AVISH, TORRY ([id number]) Positive for: Arrhythmia - pacemaker; Coronary Artery Disease; Hypertension; Myocardial Infarction Negative for: Deep Vein Thrombosis; Hypotension; Peripheral Arterial Disease; Peripheral Venous Disease; Phlebitis; Vasculitis Gastrointestinal Medical History: Negative for: Cirrhosis ; Colitis; Crohnos; Hepatitis A; Hepatitis B; Hepatitis C Endocrine Medical History: Positive for: Type II Diabetes Negative for: Type I Diabetes Time with diabetes: 5 years Treated with: Oral agents Blood sugar tested every day: Yes Tested : daily AM Blood sugar testing results: Breakfast: 174 Genitourinary Medical History: Negative for: End Stage Renal Disease Immunological Medical History: Negative for: Lupus Erythematosus; Raynaudos; Scleroderma Integumentary (Skin) Medical History: Negative for: History of Burn; History of pressure wounds Musculoskeletal Medical History: Negative for: Gout; Rheumatoid Arthritis; Osteoarthritis; Osteomyelitis Neurologic Medical History: Positive for: Neuropathy Negative for: Dementia; Quadriplegia; Paraplegia; Seizure Disorder Oncologic Medical History: Positive for: Received Chemotherapy; Received Radiation - Seeds prostate Psychiatric Complaints and Symptoms: No Complaints or Symptoms Medical History: Negative for: Anorexia/bulimia; Confinement Anxiety Bruening, [name] ([id number]) Immunizations Pneumococcal Vaccine: Received Pneumococcal Vaccination: Yes Tetanus  Vaccine:  Last tetanus shot: 12/25/2017 Implantable Devices Family and Social History Cancer: Yes - Father; Diabetes: Yes - Father; Heart Disease: Yes - Mother,Father; Hypertension: Yes - Mother; Kidney Disease: Yes - Child; Lung Disease: No; Seizures: No; Stroke: No; Thyroid Problems: No; Tuberculosis: No; Current every day smoker - 1 - 1/2 pack daily; Marital Status - Married; Alcohol Use: Rarely; Drug Use: No History; Caffeine Use: Daily - coffee; Financial Concerns: No; Food, Clothing or Shelter Needs: No; Support System Lacking: No; Transportation Concerns: No Physician Affirmation I have reviewed and agree with the above information. Electronic Signature(s) Signed: 10/10/2018 5:08:41 PM By: Worthy Keeler PA-C Signed: 10/10/2018 5:25:24 PM By: Montey Hora Entered By: Worthy Keeler on 10/10/2018 10:15:22 [place], [name] ([id number]) -------------------------------------------------------------------------------- SuperBill Details Patient Name: [name]. Date of Service: 10/08/2018 Medical Record Number: [id number] Patient Account Number: [id number] Date of Birth/Sex: [birth year] (67 y.o. M) Treating RN: Montey Hora Primary Care Provider: BESS, KATY Other Clinician: Referring Provider: BESS, KATY Treating Provider/Extender: STONE III, Hernandez [place] Weeks in Treatment: 24 Diagnosis Coding ICD-10 Codes Code Description I87.2 Venous insufficiency (chronic) (peripheral) E11.621 Type 2 diabetes mellitus with foot ulcer L97.526 Non-pressure chronic ulcer of other part of left foot with bone involvement without evidence of necrosis S81.802A Unspecified open wound, left lower leg, initial encounter I63.9 Cerebral infarction, unspecified I10 Essential (primary) hypertension Facility Procedures CPT4 Code: [id number] Description: 70141 - DEB SUBQ TISSUE 20 SQ CM/< ICD-10 Diagnosis Description S81.802A Unspecified open wound, left lower leg, initial encounter Modifier: Quantity:  1 Physician Procedures CPT4 Code: 0301314 Description: 38887 - WC PHYS SUBQ TISS 20 SQ CM ICD-10 Diagnosis Description S81.802A Unspecified open wound, left lower leg, initial encounter Modifier: Quantity: 1 Electronic Signature(s) Signed: 10/09/2018 11:13:36 AM By: Worthy Keeler PA-C Entered By: Worthy Keeler on 10/09/2018 10:40:41

## 2018-10-11 NOTE — Progress Notes (Signed)
ZIAD, MAYE ([id number]) Visit Report for 10/08/2018 Arrival Information Details Patient Name: [name], [name]. Date of Service: 10/08/2018 8:30 AM Medical Record Number: [id number] Patient Account Number: [id number] Date of Birth/Sex: June 28, 1951 (67 y.o. M) Treating RN: Montey Hora Primary Care Murline Weigel: BESS, KATY Other Clinician: Referring Ozell Juhasz: BESS, KATY Treating Laylee Schooley/Extender: STONE III, HOYT Weeks in Treatment: 24 Visit Information History Since Last Visit Added or deleted any medications: No Patient Arrived: Ambulatory Any new allergies or adverse reactions: No Arrival Time: 08:22 Had a fall or experienced change in No Accompanied By: self activities of daily living that may affect Transfer Assistance: None risk of falls: Patient Identification Verified: Yes Signs or symptoms of abuse/neglect since last visito No Secondary Verification Process Completed: Yes Hospitalized since last visit: No Patient Requires Transmission-Based No Implantable device outside of the clinic excluding No Precautions: cellular tissue based products placed in the center Patient Has Alerts: Yes since last visit: Patient Alerts: Type II Has Dressing in Place as Prescribed: Yes Diabetic Pain Present Now: No Electronic Signature(s) Signed: 10/08/2018 4:58:41 PM By: Lorine Bears RCP, RRT, CHT Entered By: Lorine Bears on 10/08/2018 08:23:21 [name] ([id number]) -------------------------------------------------------------------------------- Encounter Discharge Information Details Patient Name: Marcus Loll T. Date of Service: 10/08/2018 8:30 AM Medical Record Number: [id number] Patient Account Number: [id number] Date of Birth/Sex: 04-06-1951 (67 y.o. M) Treating RN: Montey Hora Primary Care Lovelle Lema: BESS, KATY Other Clinician: Referring Zailee Vallely: BESS, KATY Treating Ninnie Fein/Extender: STONE III, HOYT Weeks in Treatment:  24 Encounter Discharge Information Items Discharge Condition: Stable Ambulatory Status: Ambulatory Discharge Destination: Home Transportation: Private Auto Accompanied By: self Schedule Follow-up Appointment: Yes Clinical Summary of Care: Post Procedure Vitals: Temperature (F): 97.8 Pulse (bpm): 64 Respiratory Rate (breaths/min): 16 Blood Pressure (mmHg): 130/62 Electronic Signature(s) Signed: 10/08/2018 5:26:03 PM By: Montey Hora Entered By: Montey Hora on 10/08/2018 09:06:04 [name], [name] ([id number]) -------------------------------------------------------------------------------- Lower Extremity Assessment Details Patient Name: Marcus Loll T. Date of Service: 10/08/2018 8:30 AM Medical Record Number: [id number] Patient Account Number: [id number] Date of Birth/Sex: [birth year] (67 y.o. M) Treating RN: Montey Hora Primary Care Ranesha Val: BESS, KATY Other Clinician: Referring Nataleah Scioneaux: BESS, KATY Treating Zacari Radick/Extender: STONE III, HOYT Weeks in Treatment: 24 Edema Assessment Assessed: [Left: No] [Right: No] [Left: Edema] [Right: :] Calf Left: Right: Point of Measurement: 36 cm From Medial Instep 37 cm cm Ankle Left: Right: Point of Measurement: 11 cm From Medial Instep 23.5 cm cm Vascular Assessment Pulses: Dorsalis Pedis Palpable: [Left:Yes] Posterior Tibial Extremity colors, hair growth, and conditions: Extremity Color: [Left:Hyperpigmented] Hair Growth on Extremity: [Left:No] Temperature of Extremity: [Left:Warm] Capillary Refill: [Left:< 3 seconds] Toe Nail Assessment Left: Right: Thick: Yes Discolored: Yes Deformed: No Improper Length and Hygiene: Yes Electronic Signature(s) Signed: 10/08/2018 5:26:03 PM By: Montey Hora Entered By: Montey Hora on 10/08/2018 08:49:17 [name], [name] ([id number]) -------------------------------------------------------------------------------- Multi Wound Chart Details Patient Name: Marcus Loll  T. Date of Service: 10/08/2018 8:30 AM Medical Record Number: [id number] Patient Account Number: [id number] Date of Birth/Sex: [birth year] (67 y.o. M) Treating RN: Montey Hora Primary Care Ruthellen Tippy: BESS, KATY Other Clinician: Referring Marcellus Pulliam: BESS, KATY Treating Berkley Cronkright/Extender: STONE III, HOYT Weeks in Treatment: 24 Vital Signs Height(in): 69 Pulse(bpm): 64 Weight(lbs): 238 Blood Pressure(mmHg): 130/62 Body Mass Index(BMI): 35 Temperature(F): 97.8 Respiratory Rate 16 (breaths/min): Photos: [2:No Photos] [4:No Photos] [N/A:N/A] Wound Location: [2:Left Toe Great] [4:Left Lower Leg - Lateral] [N/A:N/A] Wounding Event: [2:Trauma] [4:Trauma] [N/A:N/A] Primary Etiology: [2:Diabetic Wound/Ulcer of the Lower Extremity] [4:Trauma, Other] [N/A:N/A] Secondary Etiology: [2:Trauma, Other] [4:N/A] [N/A:N/A]  Comorbid History: [2:Lymphedema, Arrhythmia, Coronary Artery Disease, Hypertension, Myocardial Infarction, Type II Diabetes, Neuropathy, Received Chemotherapy, Received Radiation] [4:Lymphedema, Arrhythmia, Coronary Artery Disease, Hypertension,  Myocardial Infarction, Type II Diabetes, Neuropathy, Received Chemotherapy, Received Radiation] [N/A:N/A] Date Acquired: [2:12/25/2017] [4:09/07/2018] [N/A:N/A] Weeks of Treatment: [2:24] [4:3] [N/A:N/A] Wound Status: [2:Open] [4:Open] [N/A:N/A] Pending Amputation on [2:Yes] [4:No] [N/A:N/A] Presentation: Measurements L x W x D [2:0.1x0.1x0.1] [4:0.2x0.3x0.1] [N/A:N/A] (cm) Area (cm) : [2:0.008] [4:0.047] [N/A:N/A] Volume (cm) : [2:0.001] [4:0.005] [N/A:N/A] % Reduction in Area: [2:83.00%] [4:97.50%] [N/A:N/A] % Reduction in Volume: [2:80.00%] [4:97.30%] [N/A:N/A] Classification: [2:Grade 1] [4:Full Thickness Without Exposed Support Structures] [N/A:N/A] Exudate Amount: [2:None Present] [4:Small] [N/A:N/A] Exudate Type: [2:N/A] [4:Serous] [N/A:N/A] Exudate Color: [2:N/A] [4:amber] [N/A:N/A] Wound Margin: [2:Flat and Intact] [4:Flat  and Intact] [N/A:N/A] Granulation Amount: [2:None Present (0%)] [4:Large (67-100%)] [N/A:N/A] Granulation Quality: [2:N/A] [4:Pink] [N/A:N/A] Necrotic Amount: [2:Medium (34-66%)] [4:Small (1-33%)] [N/A:N/A] Exposed Structures: [2:Fat Layer (Subcutaneous Tissue) Exposed: Yes Fascia: No Tendon: No] [4:Fat Layer (Subcutaneous Tissue) Exposed: Yes Fascia: No Tendon: No] [N/A:N/A] Muscle: No Muscle: No Joint: No Joint: No Bone: No Bone: No Epithelialization: Large (67-100%) Small (1-33%) N/A Periwound Skin Texture: Callus: Yes Excoriation: No N/A Excoriation: No Induration: No Induration: No Callus: No Crepitus: No Crepitus: No Rash: No Rash: No Scarring: No Scarring: No Periwound Skin Moisture: Maceration: No Maceration: No N/A Dry/Scaly: No Dry/Scaly: No Periwound Skin Color: Atrophie Blanche: No Atrophie Blanche: No N/A Cyanosis: No Cyanosis: No Ecchymosis: No Ecchymosis: No Erythema: No Erythema: No Hemosiderin Staining: No Hemosiderin Staining: No Mottled: No Mottled: No Pallor: No Pallor: No Rubor: No Rubor: No Temperature: No Abnormality No Abnormality N/A Tenderness on Palpation: Yes No N/A Wound Preparation: Ulcer Cleansing: Ulcer Cleansing: N/A Rinsed/Irrigated with Saline Rinsed/Irrigated with Saline Topical Anesthetic Applied: Topical Anesthetic Applied: Other: lidocaine 4% Other: lidocaine 4% Treatment Notes Electronic Signature(s) Signed: 10/08/2018 5:26:03 PM By: Montey Hora Entered By: Montey Hora on 10/08/2018 08:55:23 [name], [name] ([id number]) -------------------------------------------------------------------------------- [place] Details Patient Name: [name]. Date of Service: 10/08/2018 8:30 AM Medical Record Number: [id number] Patient Account Number: [id number] Date of Birth/Sex: 10/15/1951 (67 y.o. M) Treating RN: Montey Hora Primary Care Sharyl Panchal: BESS, KATY Other Clinician: Referring  Sanjuan Sawa: BESS, KATY Treating Ky Moskowitz/Extender: STONE III, HOYT Weeks in Treatment: 24 Active Inactive ` Abuse / Safety / Falls / Self Care Management Nursing Diagnoses: Potential for falls Goals: Patient will remain injury free related to falls Date Initiated: 04/19/2018 Target Resolution Date: 05/04/2018 Goal Status: Active Interventions: Assess fall risk on admission and as needed Notes: ` Nutrition Nursing Diagnoses: Impaired glucose control: actual or potential Goals: Patient/caregiver agrees to and verbalizes understanding of need to use nutritional supplements and/or vitamins as prescribed Date Initiated: 04/19/2018 Target Resolution Date: 06/07/2018 Goal Status: Active Patient/caregiver verbalizes understanding of need to maintain therapeutic glucose control per primary care physician Date Initiated: 04/19/2018 Target Resolution Date: 05/31/2018 Goal Status: Active Interventions: Assess HgA1c results as ordered upon admission and as needed Assess patient nutrition upon admission and as needed per policy Notes: ` Orientation to the Wound Care Program Nursing Diagnoses: Knowledge deficit related to the wound healing center program Goals: Patient/caregiver will verbalize understanding of the [facility] [name], [name] ([id number]) Date Initiated: 04/19/2018 Target Resolution Date: 06/29/2018 Goal Status: Active Interventions: Provide education on orientation to the wound center Notes: ` Wound/Skin Impairment Nursing Diagnoses: Impaired tissue integrity Goals: Ulcer/skin breakdown will heal within 14 weeks Date Initiated: 04/19/2018 Target Resolution Date: 06/29/2018 Goal Status: Active Interventions: Assess patient/caregiver ability to  obtain necessary supplies Assess patient/caregiver ability to perform ulcer/skin care regimen upon admission and as needed Assess ulceration(s) every visit Notes: Electronic Signature(s) Signed: 10/08/2018  5:26:03 PM By: Montey Hora Entered By: Montey Hora on 10/08/2018 08:55:16 [name], [name] ([id number]) -------------------------------------------------------------------------------- Pain Assessment Details Patient Name: Marcus Loll T. Date of Service: 10/08/2018 8:30 AM Medical Record Number: [id number] Patient Account Number: [id number] Date of Birth/Sex: 05-Jun-1951 (67 y.o. M) Treating RN: Montey Hora Primary Care Johonna Binette: BESS, Valetta Fuller Other Clinician: Referring Chanti Golubski: BESS, KATY Treating Nakeisha Greenhouse/Extender: STONE III, HOYT Weeks in Treatment: 24 Active Problems Location of Pain Severity and Description of Pain Patient Has Paino Yes Site Locations Rate the pain. Current Pain Level: 2 Worst Pain Level: 3 Least Pain Level: 1 Pain Management and Medication Current Pain Management: Electronic Signature(s) Signed: 10/08/2018 4:58:41 PM By: Lorine Bears RCP, RRT, CHT Signed: 10/08/2018 5:26:03 PM By: Montey Hora Entered By: Lorine Bears on 10/08/2018 08:23:54 [name], [name] ([id number]) -------------------------------------------------------------------------------- Patient/Caregiver Education Details Patient Name: [name]. Date of Service: 10/08/2018 8:30 AM Medical Record Number: [id number] Patient Account Number: [id number] Date of Birth/Gender: April 13, 1951 (67 y.o. M) Treating RN: Montey Hora Primary Care Physician: BESS, KATY Other Clinician: Referring Physician: Lillard Anes Treating Physician/Extender: Melburn Hake, HOYT Weeks in Treatment: 24 Education Assessment Education Provided To: Patient Education Topics Provided Wound/Skin Impairment: Handouts: Other: wound care as ordered and care of newly healed ulcer site Methods: Demonstration, Explain/Verbal Responses: State content correctly Electronic Signature(s) Signed: 10/08/2018 5:26:03 PM By: Montey Hora Entered By: Montey Hora on 10/08/2018  09:05:35 [name], [name] ([id number]) -------------------------------------------------------------------------------- Wound Assessment Details Patient Name: Marcus Loll T. Date of Service: 10/08/2018 8:30 AM Medical Record Number: [id number] Patient Account Number: [id number] Date of Birth/Sex: 03/26/1951 (67 y.o. M) Treating RN: Montey Hora Primary Care Kaidyn Hernandes: BESS, KATY Other Clinician: Referring Essie Lagunes: BESS, KATY Treating Novah Goza/Extender: STONE III, HOYT Weeks in Treatment: 24 Wound Status Wound Number: 2 Primary Diabetic Wound/Ulcer of the Lower Extremity Etiology: Wound Location: Left Toe Great Secondary Trauma, Other Wounding Event: Trauma Etiology: Date Acquired: 12/25/2017 Wound Healed - Epithelialized Weeks Of Treatment: 24 Status: Clustered Wound: No Comorbid Lymphedema, Arrhythmia, Coronary Artery Pending Amputation On Presentation History: Disease, Hypertension, Myocardial Infarction, Type II Diabetes, Neuropathy, Received Chemotherapy, Received Radiation Photos Photo Uploaded By: Secundino Ginger on 10/08/2018 10:10:18 Wound Measurements Length: (cm) 0 % Redu Width: (cm) 0 % Redu Depth: (cm) 0 Epithe Area: (cm) 0 Tunne Volume: (cm) 0 Under ction in Area: 100% ction in Volume: 100% lialization: Large (67-100%) ling: No mining: No Wound Description Classification: Grade 1 Foul O Wound Margin: Flat and Intact Slough Exudate Amount: None Present dor After Cleansing: No /Fibrino Yes Wound Bed Granulation Amount: None Present (0%) Exposed Structure Necrotic Amount: Medium (34-66%) Fascia Exposed: No Necrotic Quality: Adherent Slough Fat Layer (Subcutaneous Tissue) Exposed: Yes Tendon Exposed: No Muscle Exposed: No Joint Exposed: No Bone Exposed: No Heine, [place] T. ([id number]) Periwound Skin Texture Texture Color No Abnormalities Noted: No No Abnormalities Noted: No Callus: Yes Atrophie Blanche: No Crepitus: No Cyanosis:  No Excoriation: No Ecchymosis: No Induration: No Erythema: No Rash: No Hemosiderin Staining: No Scarring: No Mottled: No Pallor: No Moisture Rubor: No No Abnormalities Noted: No Dry / Scaly: No Temperature / Pain Maceration: No Temperature: No Abnormality Tenderness on Palpation: Yes Wound Preparation Ulcer Cleansing: Rinsed/Irrigated with Saline Topical Anesthetic Applied: Other: lidocaine 4%, Electronic Signature(s) Signed: 10/08/2018 5:26:03 PM By: Montey Hora Entered By: Montey Hora on 10/08/2018 08:57:41 [name], [name] ([id number]) --------------------------------------------------------------------------------  Wound Assessment Details Patient Name: [name], [name]. Date of Service: 10/08/2018 8:30 AM Medical Record Number: [id number] Patient Account Number: [id number] Date of Birth/Sex: 07-May-1951 (67 y.o. M) Treating RN: Montey Hora Primary Care Benard Minturn: BESS, KATY Other Clinician: Referring Annella Prowell: BESS, KATY Treating Evin Chirco/Extender: STONE III, HOYT Weeks in Treatment: 24 Wound Status Wound Number: 4 Primary Trauma, Other Etiology: Wound Location: Left Lower Leg - Lateral Wound Open Wounding Event: Trauma Status: Date Acquired: 09/07/2018 Comorbid Lymphedema, Arrhythmia, Coronary Artery Weeks Of Treatment: 3 History: Disease, Hypertension, Myocardial Infarction, Clustered Wound: No Type II Diabetes, Neuropathy, Received Chemotherapy, Received Radiation Photos Photo Uploaded By: Secundino Ginger on 10/08/2018 10:10:43 Wound Measurements Length: (cm) 0.2 Width: (cm) 0.3 Depth: (cm) 0.1 Area: (cm) 0.047 Volume: (cm) 0.005 % Reduction in Area: 97.5% % Reduction in Volume: 97.3% Epithelialization: Small (1-33%) Tunneling: No Undermining: No Wound Description Full Thickness Without Exposed Support Foul Od Classification: Structures Slough/ Wound Margin: Flat and Intact Exudate Small Amount: Exudate Type: Serous Exudate Color:  amber or After Cleansing: No Fibrino Yes Wound Bed Granulation Amount: Large (67-100%) Exposed Structure Granulation Quality: Pink Fascia Exposed: No Necrotic Amount: Small (1-33%) Fat Layer (Subcutaneous Tissue) Exposed: Yes Necrotic Quality: Adherent Slough Tendon Exposed: No Muscle Exposed: No Joint Exposed: No Shvartsman, [name] ([id number]) Bone Exposed: No Periwound Skin Texture Texture Color No Abnormalities Noted: No No Abnormalities Noted: No Callus: No Atrophie Blanche: No Crepitus: No Cyanosis: No Excoriation: No Ecchymosis: No Induration: No Erythema: No Rash: No Hemosiderin Staining: No Scarring: No Mottled: No Pallor: No Moisture Rubor: No No Abnormalities Noted: No Dry / Scaly: No Temperature / Pain Maceration: No Temperature: No Abnormality Wound Preparation Ulcer Cleansing: Rinsed/Irrigated with Saline Topical Anesthetic Applied: Other: lidocaine 4%, Treatment Notes Wound #4 (Left, Lateral Lower Leg) 1. Cleansed with: Clean wound with Normal Saline 2. Anesthetic Topical Lidocaine 4% cream to wound bed prior to debridement 4. Dressing Applied: Calcium Alginate with Silver Notes silvercel and coverlet Electronic Signature(s) Signed: 10/08/2018 5:26:03 PM By: Montey Hora Entered By: Montey Hora on 10/08/2018 08:49:01 Adelsberger, [name] ([id number]) -------------------------------------------------------------------------------- [place] Details Patient Name: [name]. Date of Service: 10/08/2018 8:30 AM Medical Record Number: [id number] Patient Account Number: [id number] Date of Birth/Sex: 1951-09-12 (67 y.o. M) Treating RN: Montey Hora Primary Care Loura Pitt: BESS, KATY Other Clinician: Referring Shykeem Resurreccion: BESS, KATY Treating Luman Holway/Extender: STONE III, HOYT Weeks in Treatment: 24 Vital Signs Time Taken: 08:23 Temperature (F): 97.8 Height (in): 69 Pulse (bpm): 64 Weight (lbs): 238 Respiratory Rate (breaths/min):  16 Body Mass Index (BMI): 35.1 Blood Pressure (mmHg): 130/62 Reference Range: 80 - 120 mg / dl Electronic Signature(s) Signed: 10/08/2018 4:58:41 PM By: Lorine Bears RCP, RRT, CHT Entered By: Becky Sax, Amado Nash on 10/08/2018 08:25:35

## 2018-10-21 ENCOUNTER — Ambulatory Visit (INDEPENDENT_AMBULATORY_CARE_PROVIDER_SITE_OTHER): Payer: PPO | Admitting: Adult Health

## 2018-10-21 ENCOUNTER — Encounter: Payer: Self-pay | Admitting: Adult Health

## 2018-10-21 ENCOUNTER — Telehealth: Payer: Self-pay | Admitting: Cardiovascular Disease

## 2018-10-21 VITALS — BP 112/71 | HR 89 | Ht 70.0 in | Wt 226.3 lb

## 2018-10-21 DIAGNOSIS — E669 Obesity, unspecified: Secondary | ICD-10-CM | POA: Diagnosis not present

## 2018-10-21 DIAGNOSIS — Z0000 Encounter for general adult medical examination without abnormal findings: Secondary | ICD-10-CM | POA: Diagnosis not present

## 2018-10-21 DIAGNOSIS — M79675 Pain in left toe(s): Secondary | ICD-10-CM

## 2018-10-21 DIAGNOSIS — E1169 Type 2 diabetes mellitus with other specified complication: Secondary | ICD-10-CM | POA: Diagnosis not present

## 2018-10-21 DIAGNOSIS — I10 Essential (primary) hypertension: Secondary | ICD-10-CM

## 2018-10-21 LAB — POCT GLYCOSYLATED HEMOGLOBIN (HGB A1C): HEMOGLOBIN A1C: 6.3 % — AB (ref 4.0–5.6)

## 2018-10-21 NOTE — Assessment & Plan Note (Signed)
Lab Results  Component Value Date   HGBA1C 6.3 (A) 10/21/2018   HGBA1C 7.0 (A) 07/22/2018   HGBA1C 8.0 (H) 03/14/2018   Continue Metformin 1000mg  BID,Farxiga 5mg  QD AM BS 110-120s, denies episodes of hypogylcemia

## 2018-10-21 NOTE — Assessment & Plan Note (Signed)
No acute trauma/injury prior to onset of sx's Uric acid level drawn

## 2018-10-21 NOTE — Assessment & Plan Note (Signed)
BP 112/71, HR 89 Continue Lisinopril 5mg  QD Advised to stop tobacco use

## 2018-10-21 NOTE — Telephone Encounter (Signed)
New message:      Patient calling the office for samples of medication:   1.  What medication and dosage are you requesting samples for? eliquis  2.  Are you currently out of this medication? Yes   Pt states he is currently in the doughnut hole.

## 2018-10-21 NOTE — Patient Instructions (Addendum)
Diabetes Mellitus and Nutrition When you have diabetes (diabetes mellitus), it is very important to have healthy eating habits because your blood sugar (glucose) levels are greatly affected by what you eat and drink. Eating healthy foods in the appropriate amounts, at about the same times every day, can help you:  Control your blood glucose.  Lower your risk of heart disease.  Improve your blood pressure.  Reach or maintain a healthy weight.  Every person with diabetes is different, and each person has different needs for a meal plan. Your health care provider may recommend that you work with a diet and nutrition specialist (dietitian) to make a meal plan that is best for you. Your meal plan may vary depending on factors such as:  The calories you need.  The medicines you take.  Your weight.  Your blood glucose, blood pressure, and cholesterol levels.  Your activity level.  Other health conditions you have, such as heart or kidney disease.  How do carbohydrates affect me? Carbohydrates affect your blood glucose level more than any other type of food. Eating carbohydrates naturally increases the amount of glucose in your blood. Carbohydrate counting is a method for keeping track of how many carbohydrates you eat. Counting carbohydrates is important to keep your blood glucose at a healthy level, especially if you use insulin or take certain oral diabetes medicines. It is important to know how many carbohydrates you can safely have in each meal. This is different for every person. Your dietitian can help you calculate how many carbohydrates you should have at each meal and for snack. Foods that contain carbohydrates include:  Bread, cereal, rice, pasta, and crackers.  Potatoes and corn.  Peas, beans, and lentils.  Milk and yogurt.  Fruit and juice.  Desserts, such as cakes, cookies, ice cream, and candy.  How does alcohol affect me? Alcohol can cause a sudden decrease in blood  glucose (hypoglycemia), especially if you use insulin or take certain oral diabetes medicines. Hypoglycemia can be a life-threatening condition. Symptoms of hypoglycemia (sleepiness, dizziness, and confusion) are similar to symptoms of having too much alcohol. If your health care provider says that alcohol is safe for you, follow these guidelines:  Limit alcohol intake to no more than 1 drink per day for nonpregnant women and 2 drinks per day for men. One drink equals 12 oz of beer, 5 oz of wine, or 1 oz of hard liquor.  Do not drink on an empty stomach.  Keep yourself hydrated with water, diet soda, or unsweetened iced tea.  Keep in mind that regular soda, juice, and other mixers may contain a lot of sugar and must be counted as carbohydrates.  What are tips for following this plan? Reading food labels  Start by checking the serving size on the label. The amount of calories, carbohydrates, fats, and other nutrients listed on the label are based on one serving of the food. Many foods contain more than one serving per package.  Check the total grams (g) of carbohydrates in one serving. You can calculate the number of servings of carbohydrates in one serving by dividing the total carbohydrates by 15. For example, if a food has 30 g of total carbohydrates, it would be equal to 2 servings of carbohydrates.  Check the number of grams (g) of saturated and trans fats in one serving. Choose foods that have low or no amount of these fats.  Check the number of milligrams (mg) of sodium in one serving. Most people   should limit total sodium intake to less than 2,300 mg per day.  Always check the nutrition information of foods labeled as "low-fat" or "nonfat". These foods may be higher in added sugar or refined carbohydrates and should be avoided.  Talk to your dietitian to identify your daily goals for nutrients listed on the label. Shopping  Avoid buying canned, premade, or processed foods. These  foods tend to be high in fat, sodium, and added sugar.  Shop around the outside edge of the grocery store. This includes fresh fruits and vegetables, bulk grains, fresh meats, and fresh dairy. Cooking  Use low-heat cooking methods, such as baking, instead of high-heat cooking methods like deep frying.  Cook using healthy oils, such as olive, canola, or sunflower oil.  Avoid cooking with butter, cream, or high-fat meats. Meal planning  Eat meals and snacks regularly, preferably at the same times every day. Avoid going long periods of time without eating.  Eat foods high in fiber, such as fresh fruits, vegetables, beans, and whole grains. Talk to your dietitian about how many servings of carbohydrates you can eat at each meal.  Eat 4-6 ounces of lean protein each day, such as lean meat, chicken, fish, eggs, or tofu. 1 ounce is equal to 1 ounce of meat, chicken, or fish, 1 egg, or 1/4 cup of tofu.  Eat some foods each day that contain healthy fats, such as avocado, nuts, seeds, and fish. Lifestyle   Check your blood glucose regularly.  Exercise at least 30 minutes 5 or more days each week, or as told by your health care provider.  Take medicines as told by your health care provider.  Do not use any products that contain nicotine or tobacco, such as cigarettes and e-cigarettes. If you need help quitting, ask your health care provider.  Work with a Social worker or diabetes educator to identify strategies to manage stress and any emotional and social challenges. What are some questions to ask my health care provider?  Do I need to meet with a diabetes educator?  Do I need to meet with a dietitian?  What number can I call if I have questions?  When are the best times to check my blood glucose? Where to find more information:  American Diabetes Association: diabetes.org/food-and-fitness/food  Academy of Nutrition and Dietetics:  PokerClues.dk  [employer] of Diabetes and Digestive and Kidney Diseases (NIH): ContactWire.be Summary  A healthy meal plan will help you control your blood glucose and maintain a healthy lifestyle.  Working with a diet and nutrition specialist (dietitian) can help you make a meal plan that is best for you.  Keep in mind that carbohydrates and alcohol have immediate effects on your blood glucose levels. It is important to count carbohydrates and to use alcohol carefully. This information is not intended to replace advice given to you by your health care provider. Make sure you discuss any questions you have with your health care provider. Document Released: 09/07/2005 Document Revised: 01/15/2017 Document Reviewed: 01/15/2017 Elsevier Interactive Patient Education  2018 [employer].  A1c- 6.3- fantastic! Blood Pressure- 112/71, HR - 89- great! Please reduce to stop tobacco use- YOU CAN DO IT! We will call you when lab results are available and if Gout is causing your left toe pain we will send in medication. If Gout is not causing the left toe pain/redness/swelling, please have wound care address tomorrow at your office visit. Please continue all your current medications as directed. Increase plain water intake and follow  diabetic diet. Continue to remain as active a possible. Follow-up in 3 months, complete physical with fasting labs. NICE TO SEE YOU!

## 2018-10-21 NOTE — Assessment & Plan Note (Signed)
  A1c- 6.3- fantastic! Blood Pressure- 112/71, HR - 89- great! Please reduce to stop tobacco use- YOU CAN DO IT! We will call you when lab results are available and if Gout is causing your left toe pain we will send in medication. If Gout is not causing the left toe pain/redness/swelling, please have wound care address tomorrow at your office visit. Please continue all your current medications as directed. Increase plain water intake and follow diabetic diet. Continue to remain as active a possible. Follow-up in 3 months, complete physical with fasting labs.

## 2018-10-21 NOTE — Progress Notes (Signed)
Subjective:    Patient ID: [name], male    DOB: 1951/06/16, 67 y.o.   MRN: [id number]  HPI:07/12/18 OV: [name] is here for regular f/u- HTN, HLD, T2D He has been taking all medications as directed, denies SE He estimates to smoke  He continues to be seen by wound care- LLE wound  04/19/18 on evaluation today patient presents initially concerning an ulcer of his left great toe and left anterior shin.  07/12/18 patient presents today for his first total contact cast change. He has had this on for three days and states that though he felt like it may be rubbing a little bit around his ankles there does not appear to be any injury at this point. Overall the wound actually looks better in my pinion. He presents today with large bulky dressing and walking boot on LLE He has compression sock on RLE He reports decrease in LLE pain He has appt with Urology this week, for possible TURP? He will f/u with cards this fall He has been using the Libra continuous blood glucose monitoring system, AM 80-120s A1c today- 7! He continues to push water and has been trying to follow heart healthy diet He remains under a lot a of stress, caring for ailing mother and all of his health issues and is not ready to stop tobacco use 10/21/18 OV: [name] presents for f/u: HTN, HLD, T2D He has acute complaint L great toe pain, redness, swelling that developed 3 days ago.  He denies acute injury/trauma prior to onset of sx's. He reports pain 0/10-10/10, currently 0/10. He completed 14 day course of doxycycline in Sept- he has OV with Wound Care tomorrow. He reports AM BS- 110-120s, denies episodes of hypoglycemia He denies CP/dyspnea/dizziness/HA/palpitations He has been trying to reduce CHO/sugar/saturated fat in diet and remains quite active with "cooking, cleaning, running around, and caring for momma". He underwent successful cystoscopy with retrograde pyelogram and stent 09/12/18 He is back on Eliquis  5mg  BID- he denies unusual bleeding/bruising  Lab Results  Component Value Date   HGBA1C 6.3 (A) 10/21/2018   HGBA1C 7.0 (A) 07/22/2018   HGBA1C 8.0 (H) 03/14/2018   Patient Care Team    Relationship Specialty Notifications Start End  Esaw Grandchild, NP PCP - General Family Medicine  11/27/17   Sanda Klein, MD PCP - Cardiology Cardiology Admissions 01/16/18     Patient Active Problem List   Diagnosis Date Noted  . Great toe pain, left 10/21/2018  . Encounter for Medicare annual wellness exam 06/20/2018  . Laceration of left lower leg 04/17/2018  . Laceration of skin of left lower leg 04/01/2018  . Increased urinary frequency 04/01/2018  . Hematuria 01/30/2018  . Puncture wound of toe of left foot 01/07/2018  . At risk for diabetic foot ulcer 01/07/2018  . Need for pneumococcal vaccination 11/27/2017  . Screening for colon cancer 11/27/2017  . Healthcare maintenance 11/27/2017  . Paroxysmal atrial fibrillation ([place]) 11/22/2016  . History of embolic stroke 09/32/6712  . Diabetes mellitus type 2 in obese ([place]) 11/22/2016  . Venous insufficiency of both lower extremities 09/21/2016  . Stroke ([place]) 08/12/2016  . Hyperglycemia   . Essential hypertension   . Vision, loss, sudden   . CVA (cerebral infarction) 08/11/2016  . CAD - inferior MI 2009, CABG 2009, patent grafts cath 04/2012 09/23/2013  . Obesity 09/23/2013  . Hyperlipidemia 09/23/2013  . Tobacco abuse 09/23/2013  . Prostate cancer ([place]) 08/18/2011  . Myocardial infarction (  [place])      Past Medical History:  Diagnosis Date  . Abdominal aortic aneurysm (AAA) ([place])    3 cm per 07-02-18 US abdominal aorta US epic  . Chronic kidney disease    renal calculi  . Chronic venous insufficiency LOWER EXTREMITIES  . Coronary artery disease CARDIOLOGIST - DR  UVOZDGUY-  LAST 1 WK AGO -- WILL REQUEST NOTE AND STRESS TEST  . Diabetes mellitus without complication ([place])    type 2  . Hematuria    last year  . Hyperglycemia     . Hyperlipidemia   . Hypertension   . Mixed dyslipidemia   . Myocardial infarction ([place])   . Neuropathy    toes only  . Nocturia   . Prostate cancer ([place]) 08/18/11   gleason 8, volume 24.4cc  . S/P CABG x 4   . ST elevation MI (STEMI) ([place]) 02-10-2008   S/P CABG  . Stroke [facility]) 2016   occ trouble wriing with right hand  . Urinary hesitancy   . Vision abnormalities    resolved now  . Wound discharge    since jan 2019 goes to wound center [facility] left great toe small open area changes dressing q 2 days with ointment provided by wound center, clear drainage occ     Past Surgical History:  Procedure Laterality Date  . CARDIAC CATHETERIZATION  05/01/2012   grafts widely patent  . CARDIOVASCULAR STRESS TEST  03-15-2010   INFERIOR WALL SCAR WITHOUT ANY MEANINGFUL ISCHEMIA/ EF 46% / LOW RISK SCAN  . CORONARY ARTERY BYPASS GRAFT  02-10-2008  DR Einar Gip   X4 VESSEL DISEASE / [facility] CABG  . CYSTOSCOPY  02/08/2012   Procedure: CYSTOSCOPY FLEXIBLE;  Surgeon: Franchot Gallo, MD;  Location: [facility];  Service: Urology;;  . [place], URETEROSCOPY AND STENT PLACEMENT Right 09/12/2018   Procedure: CYSTOSCOPY WITH RETROGRADE PYELOGRAM, URETEROSCOPY AND STENT PLACEMENT, STONE EXTRACTION;  Surgeon: Franchot Gallo, MD;  Location: [facility];  Service: Urology;  Laterality: Right;  . EP IMPLANTABLE DEVICE N/A 08/14/2016   Procedure: Loop Recorder Insertion;  Surgeon: Evans Lance, MD;  Location: [place] CV LAB;  Service: Cardiovascular;  Laterality: N/A;  . EXTRACORPOREAL SHOCK WAVE LITHOTRIPSY  2013  . HOLMIUM LASER APPLICATION Right 03/27/4741   Procedure: HOLMIUM LASER APPLICATION;  Surgeon: Franchot Gallo, MD;  Location: [facility];  Service: Urology;  Laterality: Right;  . KNEE SURGERY  age 78   RIGHT  . LEFT HEART CATHETERIZATION WITH CORONARY/GRAFT ANGIOGRAM N/A 05/01/2012   Procedure: LEFT HEART CATHETERIZATION WITH Beatrix Fetters;  Surgeon: Sanda Klein, MD;  Location: [place] CATH LAB;  Service: Cardiovascular;  Laterality: N/A;  . MULTIPLE TEETH EXTRACTIONS (23)/ FOUR QUADRANT ALVEOLPLASTY/ MANDIBULAR LATERAL EXOSTOSES REDUCTIONS  03-24-2010   CHRONIC PERIODONTITIS  . RADIOACTIVE SEED IMPLANT  02/08/2012   Procedure: RADIOACTIVE SEED IMPLANT;  Surgeon: Franchot Gallo, MD;  Location: [facility];  Service: Urology;  Laterality: N/A;  C-ARM   . SHOULDER SURGERY  1982   LEFT  . TEE WITHOUT CARDIOVERSION N/A 08/14/2016   Procedure: TRANSESOPHAGEAL ECHOCARDIOGRAM (TEE);  Surgeon: Josue Hector, MD;  Location: [facility] ENDOSCOPY;  Service: Cardiovascular;  Laterality: N/A;  . URETERAL STENT PLACEMENT       Family History  Problem Relation Age of Onset  . Cancer Father        pancreatic  . Diabetes Father      Social History   Substance and Sexual Activity  Drug  Use No     Social History   Substance and Sexual Activity  Alcohol Use Not Currently  . Alcohol/week: 2.0 standard drinks  . Types: 1 Cans of beer, 1 Shots of liquor per week   Comment: none in 3 years     Social History   Tobacco Use  Smoking Status Current Every Day Smoker  . Packs/day: 1.50  . Years: 50.00  . Pack years: 75.00  . Types: Cigarettes  Smokeless Tobacco Never Used  Tobacco Comment   PREVIOUSLY SMOKED 3PPD / DECREASED TO 1PPD SINCE 2009     Outpatient Encounter Medications as of 10/21/2018  Medication Sig Note  . apixaban (ELIQUIS) 5 MG TABS tablet Take 1 tablet (5 mg total) by mouth 2 (two) times daily.   Marland Kitchen atorvastatin (LIPITOR) 40 MG tablet Take 1 tablet (40 mg total) by mouth daily. (Patient taking differently: Take 40 mg by mouth daily after supper. )   . buPROPion (WELLBUTRIN XL) 150 MG 24 hr tablet one tablet twice daily (Patient taking differently: Take 150 mg by mouth 2 (two) times daily. )   . Continuous Blood Gluc Receiver (FREESTYLE LIBRE 14 DAY READER) DEVI 1 Device by Does not apply  route daily. Use to check blood sugars every morning and 2 hours after largest meals   . Continuous Blood Gluc Sensor (FREESTYLE LIBRE 14 DAY SENSOR) MISC 1 each by Does not apply route daily. Use to check blood sugars every morning fasting and 2 hours after largest meal   . dapagliflozin propanediol (FARXIGA) 5 MG TABS tablet Take 5 mg by mouth daily after supper.    . finasteride (PROSCAR) 5 MG tablet Take 5 mg by mouth daily.   Marland Kitchen gabapentin (NEURONTIN) 300 MG capsule TAKE 1 CAPSULE BY MOUTH IN THE MORNING AND 2 TABLETS AT DINNER TIME (Patient taking differently: Take 300-600 mg by mouth See admin instructions. TAKE 1 CAPSULE BY MOUTH IN THE MORNING AND 2 TABLETS AT [facility] TIME)   . glucose blood (FREESTYLE LITE) test strip Use to check blood sugars every morning fasting   . lisinopril (PRINIVIL,ZESTRIL) 5 MG tablet Take 0.5 tablets (2.5 mg total) by mouth daily.   . metFORMIN (GLUCOPHAGE) 1000 MG tablet Take 1 tablet (1,000 mg total) by mouth 2 (two) times daily with a meal.   . mirabegron ER (MYRBETRIQ) 25 MG TB24 tablet Take 25 mg by mouth daily.   . Multiple Vitamin (MULTIVITAMIN WITH MINERALS) TABS tablet Take 1 tablet by mouth daily. Men's One-A-Day Multivitamin   . nicotine (NICODERM CQ - DOSED IN MG/24 HOURS) 21 mg/24hr patch Place 21 mg onto the skin daily. 09/12/2018: Placed last night right arm  . oxybutynin (DITROPAN) 5 MG tablet Take 1 tablet (5 mg total) by mouth every 8 (eight) hours as needed for bladder spasms.   . sildenafil (VIAGRA) 100 MG tablet Take 100 mg by mouth daily as needed for erectile dysfunction. 09/02/2018: On hold due to upcoming procedure.  . silodosin (RAPAFLO) 4 MG CAPS capsule Take 4 mg by mouth daily.   . tamsulosin (FLOMAX) 0.4 MG CAPS capsule Take 0.4 mg by mouth daily after supper.    . traMADol (ULTRAM) 50 MG tablet Take 1 tablet (50 mg total) by mouth every 6 (six) hours as needed.    No facility-administered encounter medications on file as of 10/21/2018.       Allergies: Bee venom; Oysters [shellfish allergy]; and Penicillins  Body mass index is 32.47 kg/m.  Blood pressure 112/71, pulse  89, height 5\' 10"  (1.778 m), weight 226 lb 4.8 oz (102.6 kg), SpO2 97 %.  Review of Systems  Constitutional: Positive for fatigue. Negative for activity change, appetite change, chills, diaphoresis, fever and unexpected weight change.  Respiratory: Negative for cough, chest tightness, shortness of breath, wheezing and stridor.   Cardiovascular: Positive for leg swelling. Negative for chest pain and palpitations.  Gastrointestinal: Negative for abdominal distention, abdominal pain, blood in stool, constipation, diarrhea, nausea and vomiting.  Genitourinary: Positive for hematuria. Negative for difficulty urinating and flank pain.  Musculoskeletal: Positive for arthralgias, gait problem, joint swelling and myalgias. Negative for back pain, neck pain and neck stiffness.  Skin: Positive for wound.  Neurological: Negative for dizziness and headaches.  Hematological: Does not bruise/bleed easily.  Psychiatric/Behavioral: Negative for behavioral problems, confusion, decreased concentration, dysphoric mood, hallucinations, self-injury, sleep disturbance and suicidal ideas. The patient is not nervous/anxious and is not hyperactive.        Objective:   Physical Exam  Constitutional: He is oriented to person, place, and time. He appears well-developed and well-nourished. No distress.  HENT:  Head: Normocephalic and atraumatic.  Right Ear: External ear normal.  Left Ear: External ear normal.  Nose: Nose normal.  Mouth/Throat: Oropharynx is clear and moist.  Eyes: Conjunctivae and EOM are normal.  Pinpoint pupils  Cardiovascular: Normal rate, regular rhythm, normal heart sounds and intact distal pulses.  No murmur heard. Pulmonary/Chest: Breath sounds normal. No stridor. No respiratory distress. He has no wheezes. He has no rales. He exhibits no tenderness.   Musculoskeletal: He exhibits tenderness.       Left foot: There is tenderness and swelling.       Feet:  Neurological: He is alert and oriented to person, place, and time.  Skin: Skin is warm and dry. Capillary refill takes less than 2 seconds. No rash noted. He is not diaphoretic. There is erythema. No pallor.  L great toe- very warm to touch with erythema, tender to the touch No drainage or open tissue noted No streaking noted   Psychiatric: He has a normal mood and affect. His behavior is normal. Judgment and thought content normal.      Assessment & Plan:   1. Diabetes mellitus type 2 in obese ([place])   2. Great toe pain, left   3. Essential hypertension   4. Healthcare maintenance     Diabetes mellitus type 2 in obese [facility]) Lab Results  Component Value Date   HGBA1C 6.3 (A) 10/21/2018   HGBA1C 7.0 (A) 07/22/2018   HGBA1C 8.0 (H) 03/14/2018   Continue Metformin 1000mg  BID,Farxiga 5mg  QD AM BS 110-120s, denies episodes of hypogylcemia  Great toe pain, left No acute trauma/injury prior to onset of sx's Uric acid level drawn  Healthcare maintenance  A1c- 6.3- fantastic! Blood Pressure- 112/71, HR - 89- great! Please reduce to stop tobacco use- YOU CAN DO IT! We will call you when lab results are available and if Gout is causing your left toe pain we will send in medication. If Gout is not causing the left toe pain/redness/swelling, please have wound care address tomorrow at your office visit. Please continue all your current medications as directed. Increase plain water intake and follow diabetic diet. Continue to remain as active a possible. Follow-up in 3 months, complete physical with fasting labs.  Essential hypertension BP 112/71, HR 89 Continue Lisinopril 5mg  QD Advised to stop tobacco use     FOLLOW-UP:  Return in about 3 months (around 01/21/2019) for  CPE, Fasting Labs.

## 2018-10-21 NOTE — Telephone Encounter (Signed)
Returned call to patient [name] 5 mg samples left at [facility] office front desk. 

## 2018-10-22 ENCOUNTER — Other Ambulatory Visit
Admission: RE | Admit: 2018-10-22 | Discharge: 2018-10-22 | Disposition: A | Discharge status: Home / Self Care | Payer: PPO | Source: Ambulatory Visit | Attending: Physician Assistant | Admitting: Physician Assistant

## 2018-10-22 ENCOUNTER — Encounter: Payer: PPO | Admitting: Physician Assistant

## 2018-10-22 ENCOUNTER — Inpatient Hospital Stay: Payer: PPO | Admitting: Adult Health

## 2018-10-22 DIAGNOSIS — X58XXXA Exposure to other specified factors, initial encounter: Secondary | ICD-10-CM | POA: Diagnosis not present

## 2018-10-22 DIAGNOSIS — L03116 Cellulitis of left lower limb: Secondary | ICD-10-CM | POA: Diagnosis not present

## 2018-10-22 DIAGNOSIS — S91109A Unspecified open wound of unspecified toe(s) without damage to nail, initial encounter: Secondary | ICD-10-CM | POA: Insufficient documentation

## 2018-10-22 DIAGNOSIS — E11621 Type 2 diabetes mellitus with foot ulcer: Secondary | ICD-10-CM | POA: Diagnosis not present

## 2018-10-22 DIAGNOSIS — I872 Venous insufficiency (chronic) (peripheral): Secondary | ICD-10-CM | POA: Diagnosis not present

## 2018-10-22 DIAGNOSIS — L97829 Non-pressure chronic ulcer of other part of left lower leg with unspecified severity: Secondary | ICD-10-CM | POA: Diagnosis not present

## 2018-10-22 DIAGNOSIS — L97522 Non-pressure chronic ulcer of other part of left foot with fat layer exposed: Secondary | ICD-10-CM | POA: Diagnosis not present

## 2018-10-22 LAB — URIC ACID: URIC ACID: 5.3 mg/dL (ref 3.7–8.6)

## 2018-10-24 NOTE — Progress Notes (Addendum)
DEWITTE, VANNICE ([id number]) Visit Report for 10/22/2018 Chief Complaint Document Details Patient Name: [name], [name]. Date of Service: 10/22/2018 12:30 PM Medical Record Number: [id number] Patient Account Number: [id number] Date of Birth/Sex: 03-10-1951 (67 y.o. M) Treating RN: Montey Hora Primary Care Provider: BESS, Valetta Fuller Other Clinician: Referring Provider: BESS, KATY Treating Provider/Extender: STONE III, Sanye Ledesma Weeks in Treatment: 26 Information Obtained from: Patient Chief Complaint Left lower leg and left great toe ulcer Electronic Signature(s) Signed: 10/23/2018 1:21:52 AM By: Worthy Keeler PA-C Entered By: Worthy Keeler on 10/22/2018 13:14:30 Calleros, Huntley Dec ([id number]) -------------------------------------------------------------------------------- Debridement Details Patient Name: [name]. Date of Service: 10/22/2018 12:30 PM Medical Record Number: [id number] Patient Account Number: [id number] Date of Birth/Sex: April 22, 1951 (67 y.o. M) Treating RN: Montey Hora Primary Care Provider: BESS, KATY Other Clinician: Referring Provider: BESS, KATY Treating Provider/Extender: STONE III, Eilidh Marcano Weeks in Treatment: 26 Debridement Performed for Wound #2 Left Toe Great Assessment: Performed By: Physician STONE III, Jaman Aro E., PA-C Debridement Type: Debridement Severity of Tissue Pre Fat layer exposed Debridement: Level of Consciousness (Pre- Awake and Alert procedure): Pre-procedure Verification/Time Yes - 12:57 Out Taken: Start Time: 12:57 Pain Control: Lidocaine 4% Topical Solution Total Area Debrided (L x W): 0.2 (cm) x 0.2 (cm) = 0.04 (cm) Tissue and other material Viable, Non-Viable, Slough, Subcutaneous, Slough debrided: Level: Skin/Subcutaneous Tissue Debridement Description: Excisional Instrument: Curette Bleeding: Moderate Hemostasis Achieved: Pressure End Time: 12:59 Procedural Pain: 0 Post Procedural Pain: 0 Response to Treatment:  Procedure was tolerated well Level of Consciousness Awake and Alert (Post-procedure): Post Debridement Measurements of Total Wound Length: (cm) 0.2 Width: (cm) 0.2 Depth: (cm) 1.3 Volume: (cm) 0.041 Character of Wound/Ulcer Post Debridement: Improved Severity of Tissue Post Debridement: Fat layer exposed Post Procedure Diagnosis Same as Pre-procedure Electronic Signature(s) Signed: 10/22/2018 5:02:25 PM By: Montey Hora Signed: 10/23/2018 1:21:52 AM By: Worthy Keeler PA-C Entered By: Montey Hora on 10/22/2018 12:59:47 Gudgel, Huntley Dec ([id number]) -------------------------------------------------------------------------------- HPI Details Patient Name: Marcus Loll T. Date of Service: 10/22/2018 12:30 PM Medical Record Number: [id number] Patient Account Number: [id number] Date of Birth/Sex: 14-Apr-1951 (67 y.o. M) Treating RN: Montey Hora Primary Care Provider: BESS, KATY Other Clinician: Referring Provider: BESS, KATY Treating Provider/Extender: STONE III, Miamarie Moll Weeks in Treatment: 26 History of Present Illness Associated Signs and Symptoms: Patient has a history of chronic venous insufficiency, diabetes mellitus type II, its reform fraction, hypertension HPI Description: 04/19/18 on evaluation today patient presents initially concerning an ulcer of his left great toe and left anterior shin. He states that the shin was definitely a trauma injury where he struck this on a metal pole injuring leg. In regard to the distal left great toe he's really not sure what happened here although he does have a significant callous noted as well. He does have a pacemaker. Fortunately his ABI was 0.98 on the right and 0.83 on the left this appears to be doing fairly well. Overall I'm pleased with that. Nonetheless he states that this really has not seem to be improving. He has been tolerating the dressing changes without complication. The left anterior leg ulcer began on 04/05/18  according to what he tells [place] today although one note I did have for review said it was April 5. I'm inclined to believe it was the fifth she did have an office visit on 04/01/18 with his family nurse practitioner Faith Rogue. Nonetheless either way it's been going on this month for several weeks. The left great toe has actually been present since  January 1 he does not know how this occurred. Fortunately has no pain at the site although he does have pain on the left anterior shin. His hemoglobin A1c is 8.0 in March 2019. 04/26/18 on evaluation today patient presents for follow-up concerning his life into your lower [place] also as well as his left great toe also. Fortunately the shin actually appears to be doing better on evaluation today in my opinion I'm not seeing as much in the way of fluffernutter on the surface although it does still require debridement today this is improved. Size wise it's really not much different. With that being said the total ulcer actually appears to be a little bit more macerated today I'm not sure why they state that several days ago when this was changed that was not the case. Fortunately is not having severe pain although both areas do hurt the shin is worse. 05/03/18 on evaluation today patient appears to be doing better in regard to his left first toe and left shin ulcers. He is been tolerating the dressing changes without complication and the good news is this seems to be showing signs of getting better. Overall I'm very pleased with the progress that has been made over the past week. Patient is still having pain especially in regard to the left shin. 05/10/18 on evaluation today patient appears to be doing rather well in regard to his two ulcers. The toe ulcer appears to be smaller he does have some undermining however in this had to be cleaned out just a little bit. With that being said overall I feel like this is showing signs of improvement. The left anterior  shin ulcer also showing signs of improvement at this time. There does not appear to be any evidence of infection which is good news. 05/21/18 on evaluation today patient appears to be doing very well in regard to his left shin ulcer. He has been tolerating the dressing changes without complication. With that being said he is having issues at this point with his great toe on the left. We have not x-rayed that at this point I think we may need to. Nonetheless he has pain really at the roughly 5 o'clock location but nowhere else and I really cannot explain this I do not see any obvious form body. Nonetheless he continues to have issues with getting this area to close it's not progressing as nicely. 05/28/18 on evaluation today patient appears to be doing very well in regard to his left lower extremity anterior ulcer. He has been tolerating the dressing changes without complication. Overall this is making excellent progress with the collagen dressing. His left great toe x-ray did return and showed that he had no if you that normality. He did have a plantar heel spur but this is more degenerative. Otherwise just arthritis was noted no osteomyelitis and no obvious form body was visualized. Overall things seem to be going fairly well. With that being said he still has pain when looking at the toe from the plantar aspect at roughly the six back to the 4 o'clock location which is not quite as bad as last time but still hurts more than any other part of his wound. It does appear the Iodoflex has been of benefit however. 06/04/18 on evaluation today patient appears to be doing excellent in regard to his left anterior shin ulcer. This has made excellent progress and is showing signs of improving in fact very close to completely closing in healing. Nonetheless unfortunately his  left great toe has not faired quite as well as far as progress is concerned. He notes that after last week's Spong, CODEY BURLING.  ([id number]) debridement he actually seem to be doing a little bit better for a couple of days until Thursday of last week when he had increases in discomfort as well is changes in the draining. He states that the drainage has been more green in color according to his wife and what he has seen. He's also had more discomfort. He relates to me again that he really feels like this all started when he stepped on a nail although he does not exactly remember it it seemed to be a puncture wound that he feels may be the culprit for why all this started. Nonetheless I am getting more concerned about the possibility of osteomyelitis despite the fact that the x-ray was negative I was hopeful in this interim since last week to this week that we would see some improvement in the overall appearance of the toe. Unfortunately if anything I feel like it's a little worse. It was macerated but I think we can attribute this to the fact that the patient did have an episode last night we got stuck in the rain and he thinks the dressing got wet. He did not change it I told him in the future if this were to happen to please go ahead and change the dressing it won't hurt to change it more frequently in that situation. Otherwise other than the maceration I do not see any evidence of anything being any worse but it also does not appear to be any better. 06/11/18 on evaluation today patient's wounds both the great toe as well as the left shin both on the left side show signs of good improvement. Fortunately the patient's culture came back negative in regard to the toe and there does not appear to be any evidence of infection. His MRI is actually scheduled for 06/19/18. Fortunately this is right before I see him next week and then we can go to the results of the culture at that point. Patient is in agreement with that plan. With that being said otherwise he has been showing signs of having less pain in regard to the toe which is  also good news my hope is that he does not have osteomyelitis. 06/18/18 on evaluation today patient appears to be doing very well in regard to his left lower extremity ulcer. He also states at this point in time that he has been tolerating the dressing changes with the toe it appears also to be doing very well at this point. In general I'm happy with the progress that he seems to be making. With that being said he still continues to have pain in regard to the great toe ulcer fortunately this does not seem to be as significant as it was in the beginning but still nonetheless he is having some pain. No fevers, chills, nausea, or vomiting noted at this time. 06/25/18 on evaluation today patient actually appears to be doing very well in regard to his left anterior shin ulcer. He's been tolerating the dressing changes without complication in this seems to have done extremely well. In fact this appears to be healed. With that being said his toe ulcer still continues to get in trouble fortunately not as much as has been in the past and there have been some signs of improvement and better granulation although I still think he's getting a lot of callous  to the area which seems to indicate a lot of pressure and friction. I believe he may benefit from a total contact cast especially now that the left shin ulcer has healed. 07/02/18 on evaluation today patient appears to be doing rather well in regard to his left anterior shin ulcer which is actually completely healed at this point. With that being said he has left great toe ulcer actually appears to be doing better he states over the weekend he actually had a significant amount of drainage from the toe after it had been hurting for some time. He states when his wife change the dressing there was a lot of discharge and fluid he actually took a picture he showed me today. Since that time he has not noted as much tenderness he feels like something may have "popped  out" 07/09/18 on evaluation today patient appears to be doing about the same in regard to his toe ulcer. He really has not shown any signs of significant improvement this seems to just be maintaining at best. He continues to be extremely active in fact I think this may be the biggest issue that he continues to do things as normal in fact right now he's remodeling the bathroom. Obviously I think he is a very active individual and as previously discussed I think a total contact cast would be of great benefit for him. This was discussed with patient yet again today I think we are gonna proceed in this regard. 07/12/18 patient presents today for his first total contact cast change. He has had this on for three days and states that though he felt like it may be rubbing a little bit around his ankles there does not appear to be any injury at this point. Overall the wound actually looks better in my pinion. 07/16/18 on evaluation today patient's wound actually appears to be doing much better at this point. He has been tolerating the dressing changes without complication. With that being said I do believe the total contact cast has been of benefit he states he is having much less discomfort that he previously had. Overall I'm very pleased with how things have progressed in just one weeks worth of the total contact cast. 07/23/18 on evaluation today patient's toe ulcer on the left great toe actually appears to be showing signs of improvement which is good news. In general I'm very pleased with the progress that he has made in the cast over the past two weeks. Fortunately there does not appear to be any evidence of infection at this time. He also is not having as much maceration as was previously noted. The patient is happy in this regard that he notes that the boot and cast is not the most comfortable thing in the world. 07/30/18-He is here in follow-up evaluation for left great toe ulcer. He presents in a total  contact cast, after removal he is noted to be macerated with new injury to the dorsum of his left foot; he continues to work outside and this is most likely secondary to HALE, CHALFIN T. ([id number]) excessive sweating versus wet from a shower. We will hold off on reapplying total contact cast, allowing the dorsal foot injury to resolve and allowing for complete drying as he will continue to work outside. He will have a nurse visit next Monday to evaluate dorsal foot partial thickness ulcer and moisture/maceration to foot and follow-up in 2 weeks 08/13/18 on evaluation today patient's toe ulcer actually appears to be doing about  the same. Fortunately the dorsal foot ulcer has healed and his foot is not appearing to be as macerated. He continues to be overly active in my pinion in regard to the remodeling of his home he states he was actually finishing up some plumbing yesterday. Nonetheless I think that right now being outside the majority the day in a total contact cast in roughly 90 to 100o weather has been counterproductive not helpful for them in regard to healing the wound. I explained to him that if we're gonna get this to heal he may need to drop back a bit and take it easy to allow this to heal. He states that's not something is gonna likely be able to do. 08/20/18 on evaluation today patient actually appears to be doing a little better in regard to his toe ulcer. He's been tolerating the dressing changes much better he did not tolerate the front offloading shoe however. He states it actually caused his left knee to hurt that something that is been told before he probably needs replacement nonetheless he wasn't able to where the shoe past Friday so really only for about three days or so. Nonetheless we have not tried a PEG assist offloading shoe. I think this may be something that we could try for him. In general I do feel like the wound size is slightly smaller in particular in regard to the  area of undermining. 08/27/18 on evaluation today patient actually appears to be doing possibly a bit better in regard to his left great toe ulcer. He's been tolerating the dressing changes without complication. With that being said the patient actually does show evidence of improvement although there is still an opening of the ulcer down to the bone. He is previously tested negative for osteomyelitis by way of MRI. There does not appear to be necrotic bone noted in the base of the wound. 09/03/18 on evaluation today patient actually appears to be doing slightly better in regard to the overall size of the wound although the depth is still just as significant. He does have bone noted in the base of the wound still unfortunately. He's not having as much discomfort which is good news. Again the bone does not appear to be necessarily necrotic. Overall I am happy with the appearance but nonetheless still not pleased with the fact that this has not gotten significantly better in the past week. He has been tolerating the dressing changes without complication. He still continues to remodel his house he states these get ready to finish the drywall and then paint. After that he states he may need to do some fishing. I think fishing can actually be beneficial for him I'm more concerned about the fact that if he does not back off as far as movement and getting around in general that is gonna end up with something more significant going on. Obviously he had no osteomyelitis previous but that could change in the future. 09/06/18 unfortunately we had to see the patient today for early follow-up due to the fact that he was having increased discomfort after having seen me on Tuesday. He states that even towards Tuesday afternoon and especially in the Wednesday he had a lot of pain due to the fact that he had passed buildup in the toe. The only thing we change was adding the Prisma to be packed into the base of the wound  in order to try to help with epithelialization. With that being said what I think may have  happened is that he is continuing to have some kind of infection which is draining. This may even be a bone infection in them ride the negative when we previously performed this may have been too early to detect. Nonetheless it does seem that he clapped and fluid which filled up calls pressure and then subsequently as it started being squeezed in pushed out got a little bit better but in general he discontinued using the Prisma and things have seemed to improve over the last 24 hours. He's just using the silver alginate which has been better for him in general mainly due to the fact likely that it's helping to drain the excess fluid this drain. Fortunately there does not appear to be any evidence of worsening infection spreading up his foot. 09/17/18 on evaluation today patient actually appears to be doing about the same in regard to his left great toe. With that being said he does not seem to show any evidence of infection at this time which is great news. He's also not having any excess fluid or moisture buildup which is also great news. In general I think this is stable but I'm still concerned about the fact it does not seem to be improving as well as I would like. Nonetheless he does have an appointment with try and foot on October 7. He does have a new wound on the left lateral lower extremity unfortunately this is due to a sheet-metal injury he tells me. This happened several days ago. This is causing a little bit of discomfort fortunately there does not appear to be any evidence of infection there is some Slough on the surface of this wound. Currently patient unfortunately is not wearing the offloading shoe that we have provided him at this time. In fact we have tried multiple ways of offloading his wound including a total contact cast which he was outside in so much actually caused more maceration due  to the fact that he continues to work on remodeling his bathroom. I've also attempted front offloading shoe which she states he only wears if he's going out somewhere such as out to eat he does not wear it when he's around in the house. Nonetheless everything that I've attempted from the offloading standpoint really does not seem to be something that he is able or willing to use unfortunately. MARTAVIOUS, HARTEL ([id number]) 09/24/18 on evaluation today patient actually appears to be doing very well in regard to his great toe ulcer on the left foot. In fact this actually appears to be doing very well at this time. He is having no pain whatsoever and it in fact is the best of seeing it up to this point. 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 Nonetheless overall I feel like things are shown signs of great improvement and really there's not even much that we can pack into the wound at this point. In regard to the left lateral lower extremity this appears to be showing signs of improvement as well. 10/08/18 on evaluation today patient actually appears to be doing rather well in regard to his toe ulcer. In fact this appears to be completely healed at this point today. In regard to his lower extremity ulcer he has shown signs of significant improvement which is great news but this  is not completely closed as of yet we are getting very close in that regard. Overall I feel like he's doing the best that I've seen him do for quite some time. He never did go see the podiatrist as the wound was doing so well he did not feel that was necessary that seems to have been an appropriate move. 10/22/18 on evaluation today patient presents with the wound on the left lateral lower extremity which is almost completely healed. Unfortunately he is having issues  with his left great toe which appears to be erythematous. He did see his doctor and she tested him for gout apparently his uric acid level was negative. Nonetheless no antibiotics have been initiated at this point by primary care. The patient has been having pain which seems to be worse than what was previously noted. He never solved Dr. Felisa Bonier as we cancel that appointment since we assume the wound was healed this apparently was a false assumption. No fevers, chills, nausea, or vomiting noted at this time. Electronic Signature(s) Signed: 10/23/2018 1:21:52 AM By: Worthy Keeler PA-C Entered By: Worthy Keeler on 10/22/2018 13:46:27 Gessel, Huntley Dec ([id number]) -------------------------------------------------------------------------------- Physical Exam Details Patient Name: Marcus Loll T. Date of Service: 10/22/2018 12:30 PM Medical Record Number: [id number] Patient Account Number: [id number] Date of Birth/Sex: 1951-12-03 (67 y.o. M) Treating RN: Montey Hora Primary Care Provider: BESS, Valetta Fuller Other Clinician: Referring Provider: BESS, KATY Treating Provider/Extender: STONE III, Jaleen Finch Weeks in Treatment: 63 Constitutional Well-nourished and well-hydrated in no acute distress. Respiratory normal breathing without difficulty. Psychiatric this patient is able to make decisions and demonstrates good insight into disease process. Alert and Oriented x 3. pleasant and cooperative. Notes Upon inspection patient has erythema of the left great toe which is spreading up into the distal forefoot but fortunately does not go further into the ankle or lower extremity. Nonetheless he does have tenderness noted at the distal portion of the toe I did probe around and squeeze to some degree to evaluate for any discomfort, pain, or drainage. Unfortunately I was not able to express any fluid initially. He did have a little bit of callous covering the distal portion of the toe although this appeared  to be okay. Nonetheless I attempted to remove the eschar to determine if there was anything underline. Upon remover removal of the outer eschar I subsequently squeezed the end of the toe again and purulent drainage was expressed. Past this point I went ahead and sharply debrided the area more significantly in order to clear away the callous and tissue to open the area to be more easily packed going forward. I was he was in a pack this with something in order to work out the fluid and prevent this from selling backup prematurely yet again. Electronic Signature(s) Signed: 10/23/2018 1:21:52 AM By: Worthy Keeler PA-C Entered By: Worthy Keeler on 10/22/2018 13:48:16 Arredondo, Huntley Dec ([id number]) -------------------------------------------------------------------------------- Physician Orders Details Patient Name: [name]. Date of Service: 10/22/2018 12:30 PM Medical Record Number: [id number] Patient Account Number: [id number] Date of Birth/Sex: 1951/04/07 (67 y.o. M) Treating RN: Montey Hora Primary Care Provider: BESS, KATY Other Clinician: Referring Provider: BESS, KATY Treating Provider/Extender: STONE III, Devota Viruet Weeks in Treatment: 26 Verbal / Phone Orders: No Diagnosis Coding Wound Cleansing Wound #2 Left Toe Great o Clean wound with Normal Saline. o Cleanse wound with mild soap and water Wound #4 Left,Lateral Lower Leg o Clean wound with Normal Saline. o Cleanse wound with mild soap and water  Anesthetic (add to Medication List) Wound #4 Left,Lateral Lower Leg o Topical Lidocaine 4% cream applied to wound bed prior to debridement (In Clinic Only). Primary Wound Dressing Wound #2 Left Toe Great o Iodoform packing Gauze - cut 1/4 inch Wound #4 Left,Lateral Lower Leg o Other: - Cover bandage Secondary Dressing Wound #2 Left Toe Great o Other - bandaid or coverlet Dressing Change Frequency Wound #2 Left Toe Great o Change dressing every other  day. Wound #4 Left,Lateral Lower Leg o Change dressing every other day. Follow-up Appointments Wound #2 Left Toe Great o Return Appointment in 1 week. Wound #4 Left,Lateral Lower Leg o Return Appointment in 1 week. Off-Loading Wound #2 Left Toe Great o Open toe surgical shoe with peg assist. o Other: - keep pressure off of area Bergeman, Kade T. ([id number]) Wound #4 Left,Lateral Lower Leg o Open toe surgical shoe with peg assist. o Other: - keep pressure off of area Additional Orders / Instructions Wound #2 Left Toe Great o Stop Smoking o Vitamin A; Vitamin C, Zinc - Please add a multivitamin with 100% of these o Increase protein intake. o Activity as tolerated Wound #4 Left,Lateral Lower Leg o Stop Smoking o Vitamin A; Vitamin C, Zinc - Please add a multivitamin with 100% of these o Increase protein intake. o Activity as tolerated Laboratory o Bacteria identified in Wound by Culture (MICRO) oooo LOINC Code: 1275-1 oooo Convenience Name: Wound culture routine Patient Medications Allergies: bee venom protein (honey bee), penicillin, oyster shell Notifications Medication Indication Start End doxycycline hyclate 10/22/2018 DOSE 1 - oral 100 mg capsule - 1 capsule oral taken 2 times a day for 14 days linezolid 10/28/2018 DOSE 1 - oral 600 mg tablet - 1 tablet oral taken 2 times a day for 14 days. Please stop taking the Doxycycline Electronic Signature(s) Signed: 10/28/2018 6:26:42 PM By: Worthy Keeler PA-C Previous Signature: 10/22/2018 1:15:42 PM Version By: Worthy Keeler PA-C Entered By: Worthy Keeler on 10/28/2018 18:26:42 Hashemi, Huntley Dec ([id number]) -------------------------------------------------------------------------------- Problem List Details Patient Name: [name]. Date of Service: 10/22/2018 12:30 PM Medical Record Number: [id number] Patient Account Number: [id number] Date of Birth/Sex: June 01, 1951 (67 y.o.  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 04/19/2018 No Yes involvement without evidence of necrosis S81.802A Unspecified open wound, left lower leg, initial encounter 09/17/2018 No Yes I63.9 Cerebral infarction, unspecified 04/19/2018 No Yes I10 Essential (primary) hypertension 04/19/2018 No Yes Inactive Problems Resolved Problems ICD-10 Code Description Active Date Resolved Date S81.812A Laceration without foreign body, left lower leg, initial encounter 04/19/2018 04/19/2018 G66.599 Non-pressure chronic ulcer of other part of left lower leg with fat 04/19/2018 04/19/2018 layer exposed Electronic Signature(s) Signed: 10/23/2018 1:21:52 AM By: Ralene Bathe ([id number]) Entered By: Worthy Keeler on 10/22/2018 13:14:25 Dahle, Huntley Dec ([id number]) -------------------------------------------------------------------------------- Progress Note Details Patient Name: Marcus Loll T. Date of Service: 10/22/2018 12:30 PM Medical Record Number: [id number] Patient Account Number: [id number] Date of Birth/Sex: [birth year] (67 y.o. M) Treating RN: Montey Hora Primary Care Provider: BESS, Valetta Fuller Other Clinician: Referring Provider: BESS, KATY Treating Provider/Extender: Melburn Hake, Silvie Obremski Weeks in Treatment: 26 Subjective Chief Complaint Information  obtained from Patient Left lower leg and left great toe ulcer History of Present Illness (HPI) The following HPI elements were documented for the patient's wound: Associated Signs and Symptoms:  Patient has a history of chronic venous insufficiency, diabetes mellitus type II, its reform fraction, hypertension 04/19/18 on evaluation today patient presents initially concerning an ulcer of his left great toe and left anterior shin. He states that the shin was definitely a trauma injury where he struck this on a metal pole injuring leg. In regard to the distal left great toe he's really not sure what happened here although he does have a significant callous noted as well. He does have a pacemaker. Fortunately his ABI was 0.98 on the right and 0.83 on the left this appears to be doing fairly well. Overall I'm pleased with that. Nonetheless he states that this really has not seem to be improving. He has been tolerating the dressing changes without complication. The left anterior leg ulcer began on 04/05/18 according to what he tells [place] today although one note I did have for review said it was April 5. I'm inclined to believe it was the fifth she did have an office visit on 04/01/18 with his family nurse practitioner Faith Rogue. Nonetheless either way it's been going on this month for several weeks. The left great toe has actually been present since January 1 he does not know how this occurred. Fortunately has no pain at the site although he does have pain on the left anterior shin. His hemoglobin A1c is 8.0 in March 2019. 04/26/18 on evaluation today patient presents for follow-up concerning his life into your lower [place] also as well as his left great toe also. Fortunately the shin actually appears to be doing better on evaluation today in my opinion I'm not seeing as much in the way of fluffernutter on the surface although it does still require debridement today this is improved. Size wise it's really not much different. With that being said the total ulcer actually appears to be a little bit more macerated today I'm not sure why they state that several days ago when this was changed that was not  the case. Fortunately is not having severe pain although both areas do hurt the shin is worse. 05/03/18 on evaluation today patient appears to be doing better in regard to his left first toe and left shin ulcers. He is been tolerating the dressing changes without complication and the good news is this seems to be showing signs of getting better. Overall I'm very pleased with the progress that has been made over the past week. Patient is still having pain especially in regard to the left shin. 05/10/18 on evaluation today patient appears to be doing rather well in regard to his two ulcers. The toe ulcer appears to be smaller he does have some undermining however in this had to be cleaned out just a little bit. With that being said overall I feel like this is showing signs of improvement. The left anterior shin ulcer also showing signs of improvement at this time. There does not appear to be any evidence of infection which is good news. 05/21/18 on evaluation today patient appears to be doing very well in regard to his left shin ulcer. He has been tolerating the dressing changes without complication. With that being said he is having issues at this point with his great toe on the left. We have not x-rayed that at this point I think we may need  to. Nonetheless he has pain really at the roughly 5 o'clock location but nowhere else and I really cannot explain this I do not see any obvious form body. Nonetheless he continues to have issues with getting this area to close it's not progressing as nicely. 05/28/18 on evaluation today patient appears to be doing very well in regard to his left lower extremity anterior ulcer. He has been tolerating the dressing changes without complication. Overall this is making excellent progress with the collagen dressing. His left great toe x-ray did return and showed that he had no if you that normality. He did have a plantar heel spur Ausburn, Jedrek T. ([id number]) but this  is more degenerative. Otherwise just arthritis was noted no osteomyelitis and no obvious form body was visualized. Overall things seem to be going fairly well. With that being said he still has pain when looking at the toe from the plantar aspect at roughly the six back to the 4 o'clock location which is not quite as bad as last time but still hurts more than any other part of his wound. It does appear the Iodoflex has been of benefit however. 06/04/18 on evaluation today patient appears to be doing excellent in regard to his left anterior shin ulcer. This has made excellent progress and is showing signs of improving in fact very close to completely closing in healing. Nonetheless unfortunately his left great toe has not faired quite as well as far as progress is concerned. He notes that after last week's debridement he actually seem to be doing a little bit better for a couple of days until Thursday of last week when he had increases in discomfort as well is changes in the draining. He states that the drainage has been more green in color according to his wife and what he has seen. He's also had more discomfort. He relates to me again that he really feels like this all started when he stepped on a nail although he does not exactly remember it it seemed to be a puncture wound that he feels may be the culprit for why all this started. Nonetheless I am getting more concerned about the possibility of osteomyelitis despite the fact that the x-ray was negative I was hopeful in this interim since last week to this week that we would see some improvement in the overall appearance of the toe. Unfortunately if anything I feel like it's a little worse. It was macerated but I think we can attribute this to the fact that the patient did have an episode last night we got stuck in the rain and he thinks the dressing got wet. He did not change it I told him in the future if this were to happen to please go ahead and  change the dressing it won't hurt to change it more frequently in that situation. Otherwise other than the maceration I do not see any evidence of anything being any worse but it also does not appear to be any better. 06/11/18 on evaluation today patient's wounds both the great toe as well as the left shin both on the left side show signs of good improvement. Fortunately the patient's culture came back negative in regard to the toe and there does not appear to be any evidence of infection. His MRI is actually scheduled for 06/19/18. Fortunately this is right before I see him next week and then we can go to the results of the culture at that point. Patient is in agreement with  that plan. With that being said otherwise he has been showing signs of having less pain in regard to the toe which is also good news my hope is that he does not have osteomyelitis. 06/18/18 on evaluation today patient appears to be doing very well in regard to his left lower extremity ulcer. He also states at this point in time that he has been tolerating the dressing changes with the toe it appears also to be doing very well at this point. In general I'm happy with the progress that he seems to be making. With that being said he still continues to have pain in regard to the great toe ulcer fortunately this does not seem to be as significant as it was in the beginning but still nonetheless he is having some pain. No fevers, chills, nausea, or vomiting noted at this time. 06/25/18 on evaluation today patient actually appears to be doing very well in regard to his left anterior shin ulcer. He's been tolerating the dressing changes without complication in this seems to have done extremely well. In fact this appears to be healed. With that being said his toe ulcer still continues to get in trouble fortunately not as much as has been in the past and there have been some signs of improvement and better granulation although I still think  he's getting a lot of callous to the area which seems to indicate a lot of pressure and friction. I believe he may benefit from a total contact cast especially now that the left shin ulcer has healed. 07/02/18 on evaluation today patient appears to be doing rather well in regard to his left anterior shin ulcer which is actually completely healed at this point. With that being said he has left great toe ulcer actually appears to be doing better he states over the weekend he actually had a significant amount of drainage from the toe after it had been hurting for some time. He states when his wife change the dressing there was a lot of discharge and fluid he actually took a picture he showed me today. Since that time he has not noted as much tenderness he feels like something may have "popped out" 07/09/18 on evaluation today patient appears to be doing about the same in regard to his toe ulcer. He really has not shown any signs of significant improvement this seems to just be maintaining at best. He continues to be extremely active in fact I think this may be the biggest issue that he continues to do things as normal in fact right now he's remodeling the bathroom. Obviously I think he is a very active individual and as previously discussed I think a total contact cast would be of great benefit for him. This was discussed with patient yet again today I think we are gonna proceed in this regard. 07/12/18 patient presents today for his first total contact cast change. He has had this on for three days and states that though he felt like it may be rubbing a little bit around his ankles there does not appear to be any injury at this point. Overall the wound actually looks better in my pinion. 07/16/18 on evaluation today patient's wound actually appears to be doing much better at this point. He has been tolerating the dressing changes without complication. With that being said I do believe the total contact  cast has been of benefit he states he is having much less discomfort that he previously had. Overall I'm very  pleased with how things have progressed in just one weeks worth of the total contact cast. Offield, Huntley Dec ([id number]) 07/23/18 on evaluation today patient's toe ulcer on the left great toe actually appears to be showing signs of improvement which is good news. In general I'm very pleased with the progress that he has made in the cast over the past two weeks. Fortunately there does not appear to be any evidence of infection at this time. He also is not having as much maceration as was previously noted. The patient is happy in this regard that he notes that the boot and cast is not the most comfortable thing in the world. 07/30/18-He is here in follow-up evaluation for left great toe ulcer. He presents in a total contact cast, after removal he is noted to be macerated with new injury to the dorsum of his left foot; he continues to work outside and this is most likely secondary to excessive sweating versus wet from a shower. We will hold off on reapplying total contact cast, allowing the dorsal foot injury to resolve and allowing for complete drying as he will continue to work outside. He will have a nurse visit next Monday to evaluate dorsal foot partial thickness ulcer and moisture/maceration to foot and follow-up in 2 weeks 08/13/18 on evaluation today patient's toe ulcer actually appears to be doing about the same. Fortunately the dorsal foot ulcer has healed and his foot is not appearing to be as macerated. He continues to be overly active in my pinion in regard to the remodeling of his home he states he was actually finishing up some plumbing yesterday. Nonetheless I think that right now being outside the majority the day in a total contact cast in roughly 90 to 100 weather has been counterproductive not helpful for them in regard to healing the wound. I explained to him that if we're  gonna get this to heal he may need to drop back a bit and take it easy to allow this to heal. He states that's not something is gonna likely be able to do. 08/20/18 on evaluation today patient actually appears to be doing a little better in regard to his toe ulcer. He's been tolerating the dressing changes much better he did not tolerate the front offloading shoe however. He states it actually caused his left knee to hurt that something that is been told before he probably needs replacement nonetheless he wasn't able to where the shoe past Friday so really only for about three days or so. Nonetheless we have not tried a PEG assist offloading shoe. I think this may be something that we could try for him. In general I do feel like the wound size is slightly smaller in particular in regard to the area of undermining. 08/27/18 on evaluation today patient actually appears to be doing possibly a bit better in regard to his left great toe ulcer. He's been tolerating the dressing changes without complication. With that being said the patient actually does show evidence of improvement although there is still an opening of the ulcer down to the bone. He is previously tested negative for osteomyelitis by way of MRI. There does not appear to be necrotic bone noted in the base of the wound. 09/03/18 on evaluation today patient actually appears to be doing slightly better in regard to the overall size of the wound although the depth is still just as significant. He does have bone noted in the base of the wound still unfortunately.  He's not having as much discomfort which is good news. Again the bone does not appear to be necessarily necrotic. Overall I am happy with the appearance but nonetheless still not pleased with the fact that this has not gotten significantly better in the past week. He has been tolerating the dressing changes without complication. He still continues to remodel his house he states these get  ready to finish the drywall and then paint. After that he states he may need to do some fishing. I think fishing can actually be beneficial for him I'm more concerned about the fact that if he does not back off as far as movement and getting around in general that is gonna end up with something more significant going on. Obviously he had no osteomyelitis previous but that could change in the future. 09/06/18 unfortunately we had to see the patient today for early follow-up due to the fact that he was having increased discomfort after having seen me on Tuesday. He states that even towards Tuesday afternoon and especially in the Wednesday he had a lot of pain due to the fact that he had passed buildup in the toe. The only thing we change was adding the Prisma to be packed into the base of the wound in order to try to help with epithelialization. With that being said what I think may have happened is that he is continuing to have some kind of infection which is draining. This may even be a bone infection in them ride the negative when we previously performed this may have been too early to detect. Nonetheless it does seem that he clapped and fluid which filled up calls pressure and then subsequently as it started being squeezed in pushed out got a little bit better but in general he discontinued using the Prisma and things have seemed to improve over the last 24 hours. He's just using the silver alginate which has been better for him in general mainly due to the fact likely that it's helping to drain the excess fluid this drain. Fortunately there does not appear to be any evidence of worsening infection spreading up his foot. 09/17/18 on evaluation today patient actually appears to be doing about the same in regard to his left great toe. With that being said he does not seem to show any evidence of infection at this time which is great news. He's also not having any excess fluid or moisture buildup which  is also great news. In general I think this is stable but I'm still concerned about the fact it does not seem to be improving as well as I would like. Nonetheless he does have an appointment with try and foot on October 7. He does have a new wound on the left lateral lower extremity unfortunately this is due to a sheet-metal injury he tells me. This happened several days ago. This is causing a little bit of discomfort fortunately there does not appear to be any evidence of infection there is some Slough on the surface of this wound. ROMELLO, HOEHN ([id number]) Currently patient unfortunately is not wearing the offloading shoe that we have provided him at this time. In fact we have tried multiple ways of offloading his wound including a total contact cast which he was outside in so much actually caused more maceration due to the fact that he continues to work on remodeling his bathroom. I've also attempted front offloading shoe which she states he only wears if he's going out somewhere  such as out to eat he does not wear it when he's around in the house. Nonetheless everything that I've attempted from the offloading standpoint really does not seem to be something that he is able or willing to use unfortunately. 09/24/18 on evaluation today patient actually appears to be doing very well in regard to his great toe ulcer on the left foot. In fact this actually appears to be doing very well at this time. He is having no pain whatsoever and it in fact is the best of seeing it up to this point. 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 Nonetheless overall I feel like things are shown signs of great improvement and really there's not even much that we can pack into the wound at this point. In regard to the left lateral lower extremity this appears  to be showing signs of improvement as well. 10/08/18 on evaluation today patient actually appears to be doing rather well in regard to his toe ulcer. In fact this appears to be completely healed at this point today. In regard to his lower extremity ulcer he has shown signs of significant improvement which is great news but this is not completely closed as of yet we are getting very close in that regard. Overall I feel like he's doing the best that I've seen him do for quite some time. He never did go see the podiatrist as the wound was doing so well he did not feel that was necessary that seems to have been an appropriate move. 10/22/18 on evaluation today patient presents with the wound on the left lateral lower extremity which is almost completely healed. Unfortunately he is having issues with his left great toe which appears to be erythematous. He did see his doctor and she tested him for gout apparently his uric acid level was negative. Nonetheless no antibiotics have been initiated at this point by primary care. The patient has been having pain which seems to be worse than what was previously noted. He never solved Dr. Felisa Bonier as we cancel that appointment since we assume the wound was healed this apparently was a false assumption. No fevers, chills, nausea, or vomiting noted at this time. Patient History Information obtained from Patient. Family History Cancer - Father, Diabetes - Father, Heart Disease - Mother,Father, Hypertension - Mother, Kidney Disease - Child, No family history of Lung Disease, Seizures, Stroke, Thyroid Problems, Tuberculosis. Social History Current every day smoker - 1 - 1/2 pack daily, Marital Status - Married, Alcohol Use - Rarely, Drug Use - No History, Caffeine Use - Daily - coffee. Review of Systems (ROS) Constitutional Symptoms (General Health) Denies complaints or symptoms of Fever, Chills. Respiratory The patient has no complaints or  symptoms. Cardiovascular The patient has no complaints or symptoms. Psychiatric The patient has no complaints or symptoms. Objective Devita, RAFIK KOPPEL. ([id number]) Constitutional Well-nourished and well-hydrated in no acute distress. Vitals Time Taken: 12:36 PM, Height: 69 in, Weight: 238 lbs, BMI: 35.1, Temperature: 97.7 F, Pulse: 77 bpm, Respiratory Rate: 16 breaths/min, Blood Pressure: 113/65 mmHg. Respiratory normal breathing without difficulty. Psychiatric this patient is able to make decisions and demonstrates good insight into disease process. Alert and Oriented x 3. pleasant and cooperative. General Notes: Upon inspection patient has erythema of the left great toe which is spreading  up into the distal forefoot but fortunately does not go further into the ankle or lower extremity. Nonetheless he does have tenderness noted at the distal portion of the toe I did probe around and squeeze to some degree to evaluate for any discomfort, pain, or drainage. Unfortunately I was not able to express any fluid initially. He did have a little bit of callous covering the distal portion of the toe although this appeared to be okay. Nonetheless I attempted to remove the eschar to determine if there was anything underline. Upon remover removal of the outer eschar I subsequently squeezed the end of the toe again and purulent drainage was expressed. Past this point I went ahead and sharply debrided the area more significantly in order to clear away the callous and tissue to open the area to be more easily packed going forward. I was he was in a pack this with something in order to work out the fluid and prevent this from selling backup prematurely yet again. Integumentary (Hair, Skin) Wound #2 status is Open. Original cause of wound was Trauma. The wound is located on the Left Toe Great. The wound measures 0.2cm length x 0.2cm width x 1.2cm depth; 0.031cm^2 area and 0.038cm^3 volume. There is Fat  Layer (Subcutaneous Tissue) Exposed exposed. There is no tunneling or undermining noted. There is a large amount of purulent drainage noted. The wound margin is flat and intact. There is large (67-100%) pink granulation within the wound bed. There is no necrotic tissue within the wound bed. The periwound skin appearance exhibited: Callus, Erythema. The periwound skin appearance did not exhibit: Crepitus, Excoriation, Induration, Rash, Scarring, Dry/Scaly, Maceration, Atrophie Blanche, Cyanosis, Ecchymosis, Hemosiderin Staining, Mottled, Pallor, Rubor. The surrounding wound skin color is noted with erythema which is circumferential. Periwound temperature was noted as No Abnormality. The periwound has tenderness on palpation. Wound #4 status is Open. Original cause of wound was Trauma. The wound is located on the Left,Lateral Lower Leg. The wound measures 0.1cm length x 0.1cm width x 0.1cm depth; 0.008cm^2 area and 0.001cm^3 volume. Other Condition(s) Patient presents with Cellulitis located on the Left Foot. The skin appearance exhibited: Erythema. General Notes: Great Toe Red, swollen and tender. 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 There was a Excisional Skin/Subcutaneous Tissue Debridement with a total area of 0.04 sq cm performed by STONE III, Trever Streater E., PA-C. With the following instrument(s): Curette to remove Viable and Non-Viable tissue/material. Material  removed includes Subcutaneous Tissue and Slough and after achieving pain control using Lidocaine 4% Topical Solution. No specimens were taken. A time out was conducted at 12:57, prior to the start of the procedure. A Moderate amount of bleeding was controlled with Pressure. The procedure was tolerated well with a pain level of 0 throughout and a pain level of 0 following the procedure. Post Debridement Measurements: 0.2cm length x 0.2cm width x 1.3cm depth; 0.041cm^3 volume. Character of Wound/Ulcer Post Debridement is improved. Severity of Tissue Post Debridement is: Fat layer exposed. Post procedure Diagnosis Wound #2: Same as Pre-Procedure Plan Wound Cleansing: Wound #2 Left Toe  Great: Clean wound with Normal Saline. Cleanse wound with mild soap and water Wound #4 Left,Lateral Lower Leg: Clean wound with Normal Saline. Cleanse wound with mild soap and water Anesthetic (add to Medication List): Wound #4 Left,Lateral Lower Leg: Topical Lidocaine 4% cream applied to wound bed prior to debridement (In Clinic Only). Primary Wound Dressing: Wound #2 Left Toe Great: Iodoform packing Gauze - cut 1/4 inch Wound #4 Left,Lateral Lower Leg: Other: - Cover bandage Secondary Dressing: Wound #2 Left Toe Great: Other - bandaid or coverlet Dressing Change Frequency: Wound #2 Left Toe Great: Change dressing every other day. Wound #4 Left,Lateral Lower Leg: Change dressing every other day. Follow-up Appointments: Wound #2 Left Toe Great: Return Appointment in 1 week. Wound #4 Left,Lateral Lower Leg: Return Appointment in 1 week. Off-Loading: Wound #2 Left Toe Great: Open toe surgical shoe with peg assist. Coley, Huntley Dec ([id number]) Other: - keep pressure off of area Wound #4 Left,Lateral Lower Leg: Open toe surgical shoe with peg assist. Other: - keep pressure off of area Additional Orders / Instructions: Wound #2 Left Toe Great: Stop Smoking Vitamin A; Vitamin C, Zinc - Please add  a multivitamin with 100% of these Increase protein intake. Activity as tolerated Wound #4 Left,Lateral Lower Leg: Stop Smoking Vitamin A; Vitamin C, Zinc - Please add a multivitamin with 100% of these Increase protein intake. Activity as tolerated Laboratory ordered were: Wound culture routine The following medication(s) was prescribed: doxycycline hyclate oral 100 mg capsule 1 1 capsule oral taken 2 times a day for 14 days starting 10/22/2018 linezolid oral 600 mg tablet 1 1 tablet oral taken 2 times a day for 14 days. Please stop taking the Doxycycline starting 10/28/2018 I'm gonna suggest currently that we continue with the above wound care measures for the next week. The patient is in agreement with plan. Subsequently considering this has worsened yet again I do think that we need to go ahead and make the appointment with Dr. Felisa Bonier we will see in the referral back to their office yet again order to get him in. I did prescribe doxycycline for the time being I did perform a wound culture will see what the shows as well. If anything changes in the interim patient will contact the office and let me know otherwise again will see were things stand at follow-up in one weeks time. Please see above for specific wound care orders. We will see patient for re-evaluation in 1 week(s) here in the clinic. If anything worsens or changes patient will contact our office for additional recommendations. Addendum: I spoke with the patient as well as Randall regarding the fact that the linezolid does have an interaction with the Wellbutrin. After speaking with the pharmacist he felt that it would be safe for the patient to discontinue the Wellbutrin and take the linezolid if there were no other oral options. With that being said obviously the patient could have issues with dizziness and headaches as part of the withdrawal symptoms from discontinuing the Wellbutrin. With that being said I do believe  he's on a low dose simply due to the fact that he is taking this more for smoking cessation as opposed to anxiety/depression issues. All of this was discussed with the patient and he is in agreement with taking the antibiotic he would prefer not to have to go the IV antibiotic route which will be the other option. Subsequently he is aware of the associated side effects that can come from discontinuing the Wellbutrin  although again if he's on a low dose this may not be any issue at all for him. If anything changes or worsens he will contact the office and let us know otherwise at this point he actually is planning to just follow with Dr. Felisa Bonier his podiatrist as far as ongoing with this to. He has canceled the appointment for later this week with me here and states he will contact our office if Dr. Felisa Bonier feels that we need to be back involved in the care of his tone. Otherwise if things don't seem to improve he may taking the surgery in order to clean off the surface of the bone and then search everything closed hoping that this will in turn resolve the issue. The patient was also told to discontinue the Doxycycline. Electronic Signature(s) Signed: 10/29/2018 5:04:18 PM By: Worthy Keeler PA-C Previous Signature: 10/23/2018 1:21:52 AM Version By: Worthy Keeler PA-C Entered By: Worthy Keeler on 10/29/2018 10:11:27 Capri, Huntley Dec ([id number]) Eidem, Huntley Dec ([id number]) -------------------------------------------------------------------------------- ROS/PFSH Details Patient Name: [name]. Date of Service: 10/22/2018 12:30 PM Medical Record Number: [id number] Patient Account Number: [id number] Date of Birth/Sex: 19-Sep-1951 (67 y.o. M) Treating RN: Montey Hora Primary Care Provider: BESS, KATY Other Clinician: Referring Provider: BESS, KATY Treating Provider/Extender: STONE III, Alazay Leicht Weeks in Treatment: 26 Information Obtained From Patient Wound History Do you currently  have one or more open woundso Yes How many open wounds do you currently haveo 2 Approximately how long have you had your woundso 2 weeks How have you been treating your wound(s) until nowo yes Doctor Has your wound(s) ever healed and then re-openedo No Have you had any lab work done in the past montho No Have you tested positive for an antibiotic resistant organism (MRSA, VRE)o No Have you tested positive for osteomyelitis (bone infection)o No Have you had any tests for circulation on your legso No Have you had other problems associated with your woundso Infection, Swelling Constitutional Symptoms (General Health) Complaints and Symptoms: Negative for: Fever; Chills Eyes Medical History: Negative for: Cataracts; Glaucoma; Optic Neuritis Ear/Nose/Mouth/Throat Medical History: Negative for: Chronic sinus problems/congestion Hematologic/Lymphatic Medical History: Positive for: Lymphedema Negative for: Anemia; Hemophilia; Human Immunodeficiency Virus; Sickle Cell Disease Respiratory Complaints and Symptoms: No Complaints or Symptoms Medical History: Negative for: Aspiration; Asthma; Chronic Obstructive Pulmonary Disease (COPD); Pneumothorax; Sleep Apnea; Tuberculosis Cardiovascular Complaints and Symptoms: No Complaints or Symptoms Medical History: LEW, PROUT ([id number]) Positive for: Arrhythmia - pacemaker; Coronary Artery Disease; Hypertension; Myocardial Infarction Negative for: Deep Vein Thrombosis; Hypotension; Peripheral Arterial Disease; Peripheral Venous Disease; Phlebitis; Vasculitis Gastrointestinal Medical History: Negative for: Cirrhosis ; Colitis; Crohnos; Hepatitis A; Hepatitis B; Hepatitis C Endocrine Medical History: Positive for: Type II Diabetes Negative for: Type I Diabetes Time with diabetes: 5 years Treated with: Oral agents Blood sugar tested every day: Yes Tested : daily AM Blood sugar testing results: Breakfast: 174 Genitourinary Medical  History: Negative for: End Stage Renal Disease Immunological Medical History: Negative for: Lupus Erythematosus; Raynaudos; Scleroderma Integumentary (Skin) Medical History: Negative for: History of Burn; History of pressure wounds Musculoskeletal Medical History: Negative for: Gout; Rheumatoid Arthritis; Osteoarthritis; Osteomyelitis Neurologic Medical History: Positive for: Neuropathy Negative for: Dementia; Quadriplegia; Paraplegia; Seizure Disorder Oncologic Medical History: Positive for: Received Chemotherapy; Received Radiation - Seeds prostate Psychiatric Complaints and Symptoms: No Complaints or Symptoms Medical History: Negative for: Anorexia/bulimia; Confinement Anxiety Granderson, ARDIT DANH ([id number]) Immunizations Pneumococcal Vaccine: Received Pneumococcal Vaccination: Yes Tetanus Vaccine: Last tetanus shot: 12/25/2017 Implantable Devices Family and  Social History Cancer: Yes - Father; Diabetes: Yes - Father; Heart Disease: Yes - Mother,Father; Hypertension: Yes - Mother; Kidney Disease: Yes - Child; Lung Disease: No; Seizures: No; Stroke: No; Thyroid Problems: No; Tuberculosis: No; Current every day smoker - 1 - 1/2 pack daily; Marital Status - Married; Alcohol Use: Rarely; Drug Use: No History; Caffeine Use: Daily - coffee; Financial Concerns: No; Food, Clothing or Shelter Needs: No; Support System Lacking: No; Transportation Concerns: No Physician Affirmation I have reviewed and agree with the above information. Electronic Signature(s) Signed: 10/22/2018 5:02:25 PM By: Montey Hora Signed: 10/23/2018 1:21:52 AM By: Worthy Keeler PA-C Entered By: Worthy Keeler on 10/22/2018 13:47:01 Gjerde, Huntley Dec ([id number]) -------------------------------------------------------------------------------- SuperBill Details Patient Name: [name]. Date of Service: 10/22/2018 Medical Record Number: [id number] Patient Account Number: [id number] Date of Birth/Sex:  24-Mar-1951 (67 y.o. M) Treating RN: Montey Hora Primary Care Provider: BESS, KATY Other Clinician: Referring Provider: BESS, KATY Treating Provider/Extender: STONE III, Erla Bacchi Weeks in Treatment: 26 Diagnosis Coding ICD-10 Codes Code Description I87.2 Venous insufficiency (chronic) (peripheral) E11.621 Type 2 diabetes mellitus with foot ulcer L97.526 Non-pressure chronic ulcer of other part of left foot with bone involvement without evidence of necrosis S81.802A Unspecified open wound, left lower leg, initial encounter I63.9 Cerebral infarction, unspecified I10 Essential (primary) hypertension Facility Procedures CPT4: Description Modifier Quantity Code 59163846 11042 - DEB SUBQ TISSUE 20 SQ CM/< 1 ICD-10 Diagnosis Description L97.526 Non-pressure chronic ulcer of other part of left foot with bone involvement without evidence of necrosis Physician Procedures CPT4: Description Modifier Quantity Code [id number] 01779 - WC PHYS LEVEL 4 - EST PT 25 1 ICD-10 Diagnosis Description I87.2 Venous insufficiency (chronic) (peripheral) E11.621 Type 2 diabetes mellitus with foot ulcer L97.526 Non-pressure chronic ulcer of  other part of left foot with bone involvement without evidence of necrosis S81.802A Unspecified open wound, left lower leg, initial encounter CPT4: [id number] 11042 - WC PHYS SUBQ TISS 20 SQ CM 1 ICD-10 Diagnosis Description L97.526 Non-pressure chronic ulcer of other part of left foot with bone involvement without evidence of necrosis Electronic Signature(s) Signed: 10/23/2018 1:21:52 AM By: Worthy Keeler PA-C Entered By: Worthy Keeler on 10/22/2018 13:50:00

## 2018-10-25 LAB — AEROBIC CULTURE W GRAM STAIN (SUPERFICIAL SPECIMEN)

## 2018-10-25 LAB — AEROBIC CULTURE  (SUPERFICIAL SPECIMEN)

## 2018-10-26 NOTE — Progress Notes (Addendum)
HARLO, FABELA ([id number]) Visit Report for 10/22/2018 Arrival Information Details Patient Name: [name], [name]. Date of Service: 10/22/2018 12:30 PM Medical Record Number: [id number] Patient Account Number: [id number] Date of Birth/Sex: 04/03/1951 (67 y.o. M) Treating RN: Cornell Barman Primary Care Malan Werk: BESS, KATY Other Clinician: Referring Shadrack Brummitt: BESS, KATY Treating Kyri Dai/Extender: Melburn Hake, HOYT Weeks in Treatment: 26 Visit Information History Since Last Visit Added or deleted any medications: No Patient Arrived: Ambulatory Any new allergies or adverse reactions: No Arrival Time: 12:35 Had a fall or experienced change in No Accompanied By: self activities of daily living that may affect Transfer Assistance: None risk of falls: Patient Identification Verified: Yes Signs or symptoms of abuse/neglect since last visito No Secondary Verification Process Completed: Yes Hospitalized since last visit: No Patient Requires Transmission-Based No Has Dressing in Place as Prescribed: Yes Precautions: Pain Present Now: No Patient Has Alerts: Yes Patient Alerts: Type II Diabetic Electronic Signature(s) Signed: 10/24/2018 7:26:08 AM By: Gretta Cool, BSN, RN, CWS, Kim RN, BSN Entered By: Gretta Cool, BSN, RN, CWS, Kim on 10/22/2018 12:35:50 [name], [name] ([id number]) -------------------------------------------------------------------------------- Encounter Discharge Information Details Patient Name: Marcus Loll T. Date of Service: 10/22/2018 12:30 PM Medical Record Number: [id number] Patient Account Number: [id number] Date of Birth/Sex: [birth year] (67 y.o. M) Treating RN: Montey Hora Primary Care Kyerra Vargo: BESS, KATY Other Clinician: Referring Irelynd Zumstein: BESS, KATY Treating Antwon Rochin/Extender: STONE III, HOYT Weeks in Treatment: 26 Encounter Discharge Information Items Post Procedure Vitals Discharge Condition: Stable Temperature (F): 97.7 Ambulatory Status:  Ambulatory Pulse (bpm): 77 Discharge Destination: Home Respiratory Rate (breaths/min): 16 Transportation: Private Auto Blood Pressure (mmHg): 113/65 Accompanied By: self Schedule Follow-up Appointment: Yes Clinical Summary of Care: Electronic Signature(s) Signed: 10/22/2018 5:02:25 PM By: Montey Hora Entered By: Montey Hora on 10/22/2018 13:08:49 Yeske, [name] ([id number]) -------------------------------------------------------------------------------- Lower Extremity Assessment Details Patient Name: Marcus Loll T. Date of Service: 10/22/2018 12:30 PM Medical Record Number: [id number] Patient Account Number: [id number] Date of Birth/Sex: 30-Jun-1951 (67 y.o. M) Treating RN: Cornell Barman Primary Care Milagro Belmares: BESS, KATY Other Clinician: Referring Bibiana Gillean: BESS, KATY Treating Shameca Landen/Extender: STONE III, HOYT Weeks in Treatment: 26 Edema Assessment Assessed: [Left: No] [Right: No] [Left: Edema] [Right: :] Calf Left: Right: Point of Measurement: 36 cm From Medial Instep 37 cm cm Ankle Left: Right: Point of Measurement: 11 cm From Medial Instep 25 cm cm Vascular Assessment Pulses: Dorsalis Pedis Palpable: [Left:Yes] Posterior Tibial Extremity colors, hair growth, and conditions: Extremity Color: [Left:Hyperpigmented] Hair Growth on Extremity: [Left:No] Temperature of Extremity: [Left:Warm] Capillary Refill: [Left:< 3 seconds] Toe Nail Assessment Left: Right: Thick: No Discolored: No Deformed: No Improper Length and Hygiene: No Electronic Signature(s) Signed: 10/24/2018 7:26:08 AM By: Gretta Cool, BSN, RN, CWS, Kim RN, BSN Entered By: Gretta Cool, BSN, RN, CWS, Kim on 10/22/2018 12:42:50 [name], [name] ([id number]) -------------------------------------------------------------------------------- Multi Wound Chart Details Patient Name: Marcus Lark. Date of Service: 10/22/2018 12:30 PM Medical Record Number: [id number] Patient Account Number: [id number] Date of  Birth/Sex: [birth year] (67 y.o. M) Treating RN: Montey Hora Primary Care Metha Kolasa: BESS, KATY Other Clinician: Referring Yahya Boldman: BESS, KATY Treating Laekyn Rayos/Extender: STONE III, HOYT Weeks in Treatment: 26 Vital Signs Height(in): 69 Pulse(bpm): 77 Weight(lbs): 238 Blood Pressure(mmHg): 113/65 Body Mass Index(BMI): 35 Temperature(F): 97.7 Respiratory Rate 16 (breaths/min): Photos: [4:No Photos] [N/A:N/A] Wound Location: [4:Left, Lateral Lower Leg] [N/A:N/A] Wounding Event: [4:Trauma] [N/A:N/A] Primary Etiology: [4:Trauma, Other] [N/A:N/A] Date Acquired: [4:09/07/2018] [N/A:N/A] Weeks of Treatment: [4:5] [N/A:N/A] Wound Status: [4:Open] [N/A:N/A] Measurements L x W x D [4:1x0.7x0.1] [N/A:N/A] (cm) Area (cm) : [4:0.55] [N/A:N/A]  Volume (cm) : [4:0.055] [N/A:N/A] % Reduction in Area: [4:70.80%] [N/A:N/A] % Reduction in Volume: [4:70.70%] [N/A:N/A] Classification: [4:Full Thickness Without Exposed Support Structures] [N/A:N/A] Periwound Skin Texture: [4:No Abnormalities Noted] [N/A:N/A] Periwound Skin Moisture: [4:No Abnormalities Noted] [N/A:N/A] Periwound Skin Color: [4:No Abnormalities Noted No] [N/A:N/A N/A] Treatment Notes Electronic Signature(s) Signed: 10/22/2018 5:02:25 PM By: Montey Hora Entered By: Montey Hora on 10/22/2018 12:51:08 [name], [name] ([id number]) -------------------------------------------------------------------------------- Gila Details Patient Name: Marcus Lark. Date of Service: 10/22/2018 12:30 PM Medical Record Number: [id number] Patient Account Number: [id number] Date of Birth/Sex: 09-Aug-1951 (67 y.o. M) Treating RN: Montey Hora Primary Care Amyrah Pinkhasov: Lillard Anes Other Clinician: Referring Nazario Russom: Lillard Anes Treating Camrie Stock/Extender: Melburn Hake, HOYT Weeks in Treatment: 26 Active Inactive Electronic Signature(s) Signed: 12/02/2018 11:00:45 AM By: Gretta Cool, BSN, RN, CWS, Kim RN, BSN Signed:  03/03/2019 10:46:12 AM By: Montey Hora Previous Signature: 10/22/2018 5:02:25 PM Version By: Montey Hora Entered By: Gretta Cool BSN, RN, CWS, Kim on 12/02/2018 11:00:44 [name], [name] ([id number]) -------------------------------------------------------------------------------- Non-Wound Condition Assessment Details Patient Name: [name], [name]. Date of Service: 10/22/2018 12:30 PM Medical Record Number: [id number] Patient Account Number: [id number] Date of Birth/Sex: 1951/01/23 (67 y.o. M) Treating RN: Cornell Barman Primary Care Tashianna Broome: BESS, KATY Other Clinician: Referring Malayah Demuro: BESS, KATY Treating Blondell Laperle/Extender: STONE III, HOYT Weeks in Treatment: 26 Non-Wound Condition: Condition: Cellulitis Location: Foot Side: Left Periwound Skin Texture Texture Color No Abnormalities Noted: No No Abnormalities Noted: No Erythema: Yes Moisture Erythema Location: Circumferential No Abnormalities Noted: No Notes Great Toe Red, swollen and tender. Electronic Signature(s) Signed: 10/24/2018 7:26:08 AM By: Gretta Cool, BSN, RN, CWS, Kim RN, BSN Entered By: Gretta Cool, BSN, RN, CWS, Kim on 10/22/2018 12:41:27 [name], [name] ([id number]) -------------------------------------------------------------------------------- Pain Assessment Details Patient Name: Marcus Lark. Date of Service: 10/22/2018 12:30 PM Medical Record Number: [id number] Patient Account Number: [id number] Date of Birth/Sex: 19-Aug-1951 (67 y.o. M) Treating RN: Cornell Barman Primary Care Corin Formisano: BESS, KATY Other Clinician: Referring Blondie Riggsbee: BESS, KATY Treating Vertie Dibbern/Extender: STONE III, HOYT Weeks in Treatment: 26 Active Problems Location of Pain Severity and Description of Pain Patient Has Paino Yes Site Locations Rate the pain. Current Pain Level: 5 Worst Pain Level: 10 Least Pain Level: 3 Character of Pain Describe the Pain: Sharp Pain Management and Medication Current Pain Management: Electronic  Signature(s) Signed: 10/24/2018 7:26:08 AM By: Gretta Cool, BSN, RN, CWS, Kim RN, BSN Entered By: Gretta Cool, BSN, RN, CWS, Kim on 10/22/2018 12:36:09 [name], [name] ([id number]) -------------------------------------------------------------------------------- Patient/Caregiver Education Details Patient Name: Marcus Lark. Date of Service: 10/22/2018 12:30 PM Medical Record Number: [id number] Patient Account Number: [id number] Date of Birth/Gender: 1951/09/09 (66 y.o. M) Treating RN: Montey Hora Primary Care Physician: BESS, KATY Other Clinician: Referring Physician: Lillard Anes Treating Physician/Extender: Melburn Hake, HOYT Weeks in Treatment: 26 Education Assessment Education Provided To: Patient Education Topics Provided Infection: Handouts: Other: reportable s/s, when to go to the hospital Methods: Explain/Verbal Responses: State content correctly Electronic Signature(s) Signed: 10/22/2018 5:02:25 PM By: Montey Hora Entered By: Montey Hora on 10/22/2018 13:07:11 [name], [name] ([id number]) -------------------------------------------------------------------------------- Wound Assessment Details Patient Name: Marcus Loll T. Date of Service: 10/22/2018 12:30 PM Medical Record Number: [id number] Patient Account Number: [id number] Date of Birth/Sex: Aug 03, 1951 (67 y.o. M) Treating RN: Montey Hora Primary Care Rylee Nuzum: BESS, KATY Other Clinician: Referring Lehi Phifer: BESS, KATY Treating Lissy Deuser/Extender: STONE III, HOYT Weeks in Treatment: 26 Wound Status Wound Number: 2 Primary Diabetic Wound/Ulcer of the Lower Extremity Etiology: Wound Location: Left Toe Great Secondary Trauma, Other Wounding Event: Trauma Etiology:  Date Acquired: 12/25/2017 Wound Open Weeks Of Treatment: 26 Status: Clustered Wound: No Comorbid Lymphedema, Arrhythmia, Coronary Artery Pending Amputation On Presentation History: Disease, Hypertension, Myocardial Infarction, Type II Diabetes,  Neuropathy, Received Chemotherapy, Received Radiation Photos Photo Uploaded By: Gretta Cool, BSN, RN, CWS, Kim on 10/22/2018 16:29:37 Wound Measurements Length: (cm) 0.2 Width: (cm) 0.2 Depth: (cm) 1.2 Area: (cm) 0.031 Volume: (cm) 0.038 % Reduction in Area: 34% % Reduction in Volume: -660% Epithelialization: Large (67-100%) Tunneling: No Undermining: No Wound Description Classification: Grade 1 Foul Odor Wound Margin: Flat and Intact Slough/Fi Exudate Amount: Large Exudate Type: Purulent Exudate Color: yellow, brown, green After Cleansing: No brino Yes Wound Bed Granulation Amount: Large (67-100%) Exposed Structure Granulation Quality: Pink Fascia Exposed: No Necrotic Amount: None Present (0%) Fat Layer (Subcutaneous Tissue) Exposed: Yes Tendon Exposed: No Muscle Exposed: No Joint Exposed: No [name], [name] ([id number]) Bone Exposed: No Periwound Skin Texture Texture Color No Abnormalities Noted: No No Abnormalities Noted: No Callus: Yes Atrophie Blanche: No Crepitus: No Cyanosis: No Excoriation: No Ecchymosis: No Induration: No Erythema: Yes Rash: No Erythema Location: Circumferential Scarring: No Hemosiderin Staining: No Mottled: No Moisture Pallor: No No Abnormalities Noted: No Rubor: No Dry / Scaly: No Maceration: No Temperature / Pain Temperature: No Abnormality Tenderness on Palpation: Yes Wound Preparation Ulcer Cleansing: Rinsed/Irrigated with Saline Topical Anesthetic Applied: None Electronic Signature(s) Signed: 10/22/2018 5:02:25 PM By: Montey Hora Entered By: Montey Hora on 10/22/2018 12:58:40 [name], [name] ([id number]) -------------------------------------------------------------------------------- Wound Assessment Details Patient Name: Marcus Loll T. Date of Service: 10/22/2018 12:30 PM Medical Record Number: [id number] Patient Account Number: [id number] Date of Birth/Sex: 02-08-1951 (67 y.o. M) Treating RN: Montey Hora Primary Care Pegeen Stiger: BESS, KATY Other Clinician: Referring Anthonia Monger: BESS, KATY Treating Lynden Carrithers/Extender: STONE III, HOYT Weeks in Treatment: 26 Wound Status Wound Number: 4 Primary Etiology: Trauma, Other Wound Location: Left, Lateral Lower Leg Wound Status: Open Wounding Event: Trauma Date Acquired: 09/07/2018 Weeks Of Treatment: 5 Clustered Wound: No Photos Photo Uploaded By: Gretta Cool, BSN, RN, CWS, Kim on 10/22/2018 16:33:13 Wound Measurements Length: (cm) 0.1 Width: (cm) 0.1 Depth: (cm) 0.1 Area: (cm) 0.008 Volume: (cm) 0.001 % Reduction in Area: 99.6% % Reduction in Volume: 99.5% Wound Description Full Thickness Without Exposed Support Classification: Structures Periwound Skin Texture Texture Color No Abnormalities Noted: No No Abnormalities Noted: No Moisture No Abnormalities Noted: No Electronic Signature(s) Signed: 10/22/2018 5:02:25 PM By: Montey Hora Entered By: Montey Hora on 10/22/2018 12:51:41 Oshel, [name] ([id number]) -------------------------------------------------------------------------------- Vitals Details Patient Name: Marcus Lark. Date of Service: 10/22/2018 12:30 PM Medical Record Number: [id number] Patient Account Number: [id number] Date of Birth/Sex: 10/22/1951 (67 y.o. M) Treating RN: Cornell Barman Primary Care Taesean Reth: BESS, KATY Other Clinician: Referring Shyne Lehrke: BESS, KATY Treating Loyalty Arentz/Extender: STONE III, HOYT Weeks in Treatment: 26 Vital Signs Time Taken: 12:36 Temperature (F): 97.7 Height (in): 69 Pulse (bpm): 77 Weight (lbs): 238 Respiratory Rate (breaths/min): 16 Body Mass Index (BMI): 35.1 Blood Pressure (mmHg): 113/65 Reference Range: 80 - 120 mg / dl Electronic Signature(s) Signed: 10/24/2018 7:26:08 AM By: Gretta Cool, BSN, RN, CWS, Kim RN, BSN Entered By: Gretta Cool, BSN, RN, CWS, Kim on 10/22/2018 12:36:32

## 2018-10-28 ENCOUNTER — Ambulatory Visit (INDEPENDENT_AMBULATORY_CARE_PROVIDER_SITE_OTHER): Payer: PPO | Admitting: *Deleted

## 2018-10-28 ENCOUNTER — Ambulatory Visit: Payer: PPO | Admitting: Podiatry

## 2018-10-28 ENCOUNTER — Ambulatory Visit (INDEPENDENT_AMBULATORY_CARE_PROVIDER_SITE_OTHER): Payer: PPO

## 2018-10-28 DIAGNOSIS — M778 Other enthesopathies, not elsewhere classified: Secondary | ICD-10-CM

## 2018-10-28 DIAGNOSIS — L03032 Cellulitis of left toe: Secondary | ICD-10-CM | POA: Diagnosis not present

## 2018-10-28 DIAGNOSIS — R3129 Other microscopic hematuria: Secondary | ICD-10-CM | POA: Diagnosis not present

## 2018-10-28 DIAGNOSIS — M779 Enthesopathy, unspecified: Secondary | ICD-10-CM | POA: Diagnosis not present

## 2018-10-28 DIAGNOSIS — I63411 Cerebral infarction due to embolism of right middle cerebral artery: Secondary | ICD-10-CM

## 2018-10-28 DIAGNOSIS — N200 Calculus of kidney: Secondary | ICD-10-CM | POA: Diagnosis not present

## 2018-10-28 DIAGNOSIS — L02612 Cutaneous abscess of left foot: Secondary | ICD-10-CM | POA: Diagnosis not present

## 2018-10-29 NOTE — Progress Notes (Signed)
Carelink Summary Report / Loop Recorder 

## 2018-10-30 NOTE — Progress Notes (Signed)
Subjective:   Patient ID: [name], male   DOB: 67 y.o.   MRN: [id number]   HPI Patient continues to experience a small opening in the left big toe from previous injury and is been to wound care and had previous drainage but it is not draining currently but he is frustrated because it does not completely heal   ROS      Objective:  Physical Exam  Neurovascular status unchanged with patient noted to have a small crusted area left hallux distal is localized with no erythema edema or drainage noted around the area     Assessment:  Difficult to tell as to whether there may be a deeper abscess reducible channel associated with this or if it is superficial with possibility for bone pathology     Plan:  Reviewed x-ray and condition and at this point since it is not draining and just crusted on the distal portion I have recommended just keeping it clean utilizing padding and if it were to recur again we may need to excise the area and explore it in a deeper fashion for abscess or channel or possible bone infection  X-ray does not currently indicate ostial lysis or indications of bone infection.  Appears to be more superficial in the channel that it occurring

## 2018-10-31 ENCOUNTER — Ambulatory Visit: Payer: PPO | Admitting: Physician Assistant

## 2018-11-12 ENCOUNTER — Telehealth: Payer: Self-pay | Admitting: Cardiovascular Disease

## 2018-11-12 NOTE — Telephone Encounter (Signed)
Returned call to patient Eliquis 5 mg samples left at [facility] office front desk. 

## 2018-11-12 NOTE — Telephone Encounter (Signed)
°  Patient calling the office for samples of medication: ° ° °1.  What medication and dosage are you requesting samples for? °Eliquis  °2.  Are you currently out of this medication?  °Yes  ° ° °

## 2018-12-02 ENCOUNTER — Ambulatory Visit (INDEPENDENT_AMBULATORY_CARE_PROVIDER_SITE_OTHER): Payer: PPO

## 2018-12-02 DIAGNOSIS — I63411 Cerebral infarction due to embolism of right middle cerebral artery: Secondary | ICD-10-CM

## 2018-12-04 NOTE — Progress Notes (Signed)
Carelink Summary Report / Loop Recorder 

## 2018-12-05 ENCOUNTER — Telehealth: Payer: Self-pay | Admitting: Cardiovascular Disease

## 2018-12-05 NOTE — Telephone Encounter (Signed)
Returned call to patient Eliquis 5 mg samples left at [facility] office front desk. 

## 2018-12-05 NOTE — Telephone Encounter (Signed)
New message   Patient calling the office for samples of medication:   1.  What medication and dosage are you requesting samples for? apixaban (ELIQUIS) 5 MG TABS tablet  2.  Are you currently out of this medication? No, patient has one more pill for today

## 2018-12-20 ENCOUNTER — Telehealth: Payer: Self-pay | Admitting: Cardiovascular Disease

## 2018-12-20 NOTE — Telephone Encounter (Signed)
Returned call to patient office out of Eliquis 5 mg samples.

## 2018-12-20 NOTE — Telephone Encounter (Signed)
Patient calling the office for samples of medication: ° ° °1.  What medication and dosage are you requesting samples for? °Eliquis 5 mg  °2.  Are you currently out of this medication?  °Yes ° ° ° °

## 2018-12-21 LAB — CUP PACEART REMOTE DEVICE CHECK
MDC IDC PG IMPLANT DT: 20170821
MDC IDC SESS DTM: 20191105004104

## 2018-12-26 ENCOUNTER — Other Ambulatory Visit: Payer: Self-pay | Admitting: Cardiovascular Disease

## 2018-12-31 ENCOUNTER — Telehealth: Payer: Self-pay

## 2018-12-31 NOTE — Telephone Encounter (Signed)
Spoke to patient office just received Eliquis samples.Eliquis 5 mg samples left at front desk of [facility] office.

## 2018-12-31 NOTE — Telephone Encounter (Signed)
Spoke to patient Eliquis 5 mg samples left at [facility] office front desk. 

## 2019-01-02 ENCOUNTER — Ambulatory Visit (INDEPENDENT_AMBULATORY_CARE_PROVIDER_SITE_OTHER): Payer: PPO

## 2019-01-02 DIAGNOSIS — I63411 Cerebral infarction due to embolism of right middle cerebral artery: Secondary | ICD-10-CM

## 2019-01-03 NOTE — Progress Notes (Signed)
Carelink Summary Report / Loop Recorder 

## 2019-01-04 LAB — CUP PACEART REMOTE DEVICE CHECK
Implantable Pulse Generator Implant Date: 20170821
MDC IDC SESS DTM: 20200110013833

## 2019-01-12 LAB — CUP PACEART REMOTE DEVICE CHECK
Implantable Pulse Generator Implant Date: 20170821
MDC IDC SESS DTM: 20191208013808

## 2019-01-14 ENCOUNTER — Other Ambulatory Visit (INDEPENDENT_AMBULATORY_CARE_PROVIDER_SITE_OTHER): Payer: PPO

## 2019-01-14 ENCOUNTER — Other Ambulatory Visit: Payer: Self-pay

## 2019-01-14 ENCOUNTER — Other Ambulatory Visit: Payer: Self-pay | Admitting: Cardiovascular Disease

## 2019-01-14 DIAGNOSIS — Z0000 Encounter for general adult medical examination without abnormal findings: Secondary | ICD-10-CM | POA: Diagnosis not present

## 2019-01-14 DIAGNOSIS — E1169 Type 2 diabetes mellitus with other specified complication: Secondary | ICD-10-CM

## 2019-01-14 DIAGNOSIS — E669 Obesity, unspecified: Secondary | ICD-10-CM

## 2019-01-14 DIAGNOSIS — E785 Hyperlipidemia, unspecified: Secondary | ICD-10-CM | POA: Diagnosis not present

## 2019-01-15 ENCOUNTER — Other Ambulatory Visit: Payer: Self-pay | Admitting: Adult Health

## 2019-01-15 LAB — COMPREHENSIVE METABOLIC PANEL
A/G RATIO: 1.7 (ref 1.2–2.2)
ALT: 18 IU/L (ref 0–44)
AST: 16 IU/L (ref 0–40)
Albumin: 4.3 g/dL (ref 3.8–4.8)
Alkaline Phosphatase: 108 IU/L (ref 39–117)
BUN/Creatinine Ratio: 17 (ref 10–24)
BUN: 13 mg/dL (ref 8–27)
Bilirubin Total: 0.3 mg/dL (ref 0.0–1.2)
CHLORIDE: 101 mmol/L (ref 96–106)
CO2: 22 mmol/L (ref 20–29)
Calcium: 9.2 mg/dL (ref 8.6–10.2)
Creatinine, Ser: 0.78 mg/dL (ref 0.76–1.27)
GFR calc Af Amer: 109 mL/min/{1.73_m2} (ref >59)
GFR calc non Af Amer: 94 mL/min/{1.73_m2} (ref >59)
Globulin, Total: 2.5 g/dL (ref 1.5–4.5)
Glucose: 152 mg/dL — ABNORMAL HIGH (ref 65–99)
POTASSIUM: 4.5 mmol/L (ref 3.5–5.2)
Sodium: 139 mmol/L (ref 134–144)
Total Protein: 6.8 g/dL (ref 6.0–8.5)

## 2019-01-15 LAB — CBC WITH DIFFERENTIAL/PLATELET
Basophils Absolute: 0.1 10*3/uL (ref 0.0–0.2)
Basos: 1 %
EOS (ABSOLUTE): 0.3 10*3/uL (ref 0.0–0.4)
Eos: 2 %
Hematocrit: 41.8 % (ref 37.5–51.0)
Hemoglobin: 14 g/dL (ref 13.0–17.7)
Immature Grans (Abs): 0 10*3/uL (ref 0.0–0.1)
Immature Granulocytes: 0 %
Lymphocytes Absolute: 5.2 10*3/uL — ABNORMAL HIGH (ref 0.7–3.1)
Lymphs: 40 %
MCH: 28.1 pg (ref 26.6–33.0)
MCHC: 33.5 g/dL (ref 31.5–35.7)
MCV: 84 fL (ref 79–97)
Monocytes Absolute: 0.7 10*3/uL (ref 0.1–0.9)
Monocytes: 6 %
Neutrophils Absolute: 6.8 10*3/uL (ref 1.4–7.0)
Neutrophils: 51 %
Platelets: 257 10*3/uL (ref 150–450)
RBC: 4.99 x10E6/uL (ref 4.14–5.80)
RDW: 14.7 % (ref 11.6–15.4)
WBC: 13.2 10*3/uL — ABNORMAL HIGH (ref 3.4–10.8)

## 2019-01-15 LAB — HEMOGLOBIN A1C
Est. average glucose Bld gHb Est-mCnc: 151 mg/dL
Hgb A1c MFr Bld: 6.9 % — ABNORMAL HIGH (ref 4.8–5.6)

## 2019-01-15 LAB — LIPID PANEL
Chol/HDL Ratio: 2.4 ratio (ref 0.0–5.0)
Cholesterol, Total: 83 mg/dL — ABNORMAL LOW (ref 100–199)
HDL: 34 mg/dL — ABNORMAL LOW (ref >39)
LDL CALC: 29 mg/dL (ref 0–99)
Triglycerides: 98 mg/dL (ref 0–149)
VLDL Cholesterol Cal: 20 mg/dL (ref 5–40)

## 2019-01-15 LAB — TSH: TSH: 2.35 u[IU]/mL (ref 0.450–4.500)

## 2019-01-15 MED ORDER — ATORVASTATIN CALCIUM 20 MG PO TABS: 20.0000 mg | ORAL_TABLET | Freq: Every day | ORAL | 1 refills | Status: DC

## 2019-01-15 NOTE — Progress Notes (Addendum)
Subjective:    Patient ID: [name], male    DOB: May 31, 1951, 68 y.o.   MRN: [id number]  HPI:  [name] is her CPE He reports medication compliance, denies SE He reports continues pain in L great toe, described as occasional "stab" He reports AM BS 80-120s, denies episodes oh hypoglycemia He has upcoming cardiology appt next week  Reviewed recent labs- TSH-WNL, 2.350 Lipid Panel- reduced Atorvastatin 40mg  reduce to 20mg  QD Tot chol 83 HDL 34 LDL 29 A1c-6.9, bump from 6.3 two months ago CMP-stable CBC-chronically elevated WBC/lymphocytes- may be r/t to chronic toe infections He has never been seen by Onc/Hemat for work-up Advised to f/u with Podiatrist , re: L great toe pain and callus removal Will repeat CBC after procedure  Healthcare Maintenance: Colonoscopy-Ordered AAA Screening-UTD, repeat 2022 LDCT-Last 09/16/18, detected nodule, repeat annually in Sept 2020 Immunizations-ordered Pnuemoccal  Patient Care Team    Relationship Specialty Notifications Start End  Mina Marble D, NP PCP - General Family Medicine  11/27/17   Sanda Klein, MD PCP - Cardiology Cardiology Admissions 01/16/18     Patient Active Problem List   Diagnosis Date Noted  . Great toe pain, left 10/21/2018  . Encounter for Medicare annual wellness exam 06/20/2018  . Laceration of left lower leg 04/17/2018  . Laceration of skin of left lower leg 04/01/2018  . Increased urinary frequency 04/01/2018  . Hematuria 01/30/2018  . Puncture wound of toe of left foot 01/07/2018  . At risk for diabetic foot ulcer 01/07/2018  . Need for pneumococcal vaccination 11/27/2017  . Screening for colon cancer 11/27/2017  . Healthcare maintenance 11/27/2017  . Paroxysmal atrial fibrillation ([place]) 11/22/2016  . History of embolic stroke 78/58/8502  . Diabetes mellitus type 2 in obese ([place]) 11/22/2016  . Venous insufficiency of both lower extremities 09/21/2016  . Stroke ([place]) 08/12/2016  . Hyperglycemia    . Essential hypertension   . Vision, loss, sudden   . CVA (cerebral infarction) 08/11/2016  . CAD - inferior MI 2009, CABG 2009, patent grafts cath 04/2012 09/23/2013  . Obesity 09/23/2013  . Hyperlipidemia 09/23/2013  . Tobacco abuse 09/23/2013  . Prostate cancer ([place]) 08/18/2011  . Myocardial infarction [facility])      Past Medical History:  Diagnosis Date  . Abdominal aortic aneurysm (AAA) ([place])    3 cm per 07-02-18 US abdominal aorta US epic  . Chronic kidney disease    renal calculi  . Chronic venous insufficiency LOWER EXTREMITIES  . Coronary artery disease CARDIOLOGIST - DR  DXAJOINO-  LAST 1 WK AGO -- WILL REQUEST NOTE AND STRESS TEST  . Diabetes mellitus without complication ([place])    type 2  . Hematuria    last year  . Hyperglycemia   . Hyperlipidemia   . Hypertension   . Mixed dyslipidemia   . Myocardial infarction ([place])   . Neuropathy    toes only  . Nocturia   . Prostate cancer ([place]) 08/18/11   gleason 8, volume 24.4cc  . S/P CABG x 4   . ST elevation MI (STEMI) ([place]) 02-10-2008   S/P CABG  . Stroke [facility]) 2016   occ trouble wriing with right hand  . Urinary hesitancy   . Vision abnormalities    resolved now  . Wound discharge    since jan 2019 goes to wound center [facility] [facility] left great toe small open area changes dressing q 2 days with ointment provided by wound center, clear drainage occ  Past Surgical History:  Procedure Laterality Date  . CARDIAC CATHETERIZATION  05/01/2012   grafts widely patent  . CARDIOVASCULAR STRESS TEST  03-15-2010   INFERIOR WALL SCAR WITHOUT ANY MEANINGFUL ISCHEMIA/ EF 46% / LOW RISK SCAN  . CORONARY ARTERY BYPASS GRAFT  02-10-2008  DR Einar Gip   X4 VESSEL DISEASE / [facility] CABG  . CYSTOSCOPY  02/08/2012   Procedure: CYSTOSCOPY FLEXIBLE;  Surgeon: Franchot Gallo, MD;  Location: [facility];  Service: Urology;;  . [place], URETEROSCOPY AND STENT PLACEMENT Right 09/12/2018    Procedure: CYSTOSCOPY WITH RETROGRADE PYELOGRAM, URETEROSCOPY AND STENT PLACEMENT, STONE EXTRACTION;  Surgeon: Franchot Gallo, MD;  Location: [facility];  Service: Urology;  Laterality: Right;  . EP IMPLANTABLE DEVICE N/A 08/14/2016   Procedure: Loop Recorder Insertion;  Surgeon: Evans Lance, MD;  Location: [facility];  Service: Cardiovascular;  Laterality: N/A;  . EXTRACORPOREAL SHOCK WAVE LITHOTRIPSY  2013  . HOLMIUM LASER APPLICATION Right 0/09/9322   Procedure: HOLMIUM LASER APPLICATION;  Surgeon: Franchot Gallo, MD;  Location: [facility];  Service: Urology;  Laterality: Right;  . KNEE SURGERY  age 72   RIGHT  . LEFT HEART CATHETERIZATION WITH CORONARY/GRAFT ANGIOGRAM N/A 05/01/2012   Procedure: LEFT HEART CATHETERIZATION WITH Beatrix Fetters;  Surgeon: Sanda Klein, MD;  Location: [facility] CATH LAB;  Service: Cardiovascular;  Laterality: N/A;  . MULTIPLE TEETH EXTRACTIONS (23)/ FOUR QUADRANT ALVEOLPLASTY/ MANDIBULAR LATERAL EXOSTOSES REDUCTIONS  03-24-2010   CHRONIC PERIODONTITIS  . RADIOACTIVE SEED IMPLANT  02/08/2012   Procedure: RADIOACTIVE SEED IMPLANT;  Surgeon: Franchot Gallo, MD;  Location: [facility];  Service: Urology;  Laterality: N/A;  C-ARM   . SHOULDER SURGERY  1982   LEFT  . TEE WITHOUT CARDIOVERSION N/A 08/14/2016   Procedure: TRANSESOPHAGEAL ECHOCARDIOGRAM (TEE);  Surgeon: Josue Hector, MD;  Location: [facility] ENDOSCOPY;  Service: Cardiovascular;  Laterality: N/A;  . URETERAL STENT PLACEMENT       Family History  Problem Relation Age of Onset  . Cancer Father        pancreatic  . Diabetes Father      Social History   Substance and Sexual Activity  Drug Use No     Social History   Substance and Sexual Activity  Alcohol Use Not Currently  . Alcohol/week: 2.0 standard drinks  . Types: 1 Cans of beer, 1 Shots of liquor per week   Comment: none in 3 years     Social History   Tobacco Use  Smoking Status Current Every Day  Smoker  . Packs/day: 1.50  . Years: 50.00  . Pack years: 75.00  . Types: Cigarettes  Smokeless Tobacco Never Used  Tobacco Comment   PREVIOUSLY SMOKED 3PPD / DECREASED TO 1PPD SINCE 2009     Outpatient Encounter Medications as of 01/21/2019  Medication Sig Note  . apixaban (ELIQUIS) 5 MG TABS tablet Take 1 tablet by mouth twice daily.  Please schedule MD appt for further refills   . atorvastatin (LIPITOR) 20 MG tablet Take 1 tablet (20 mg total) by mouth daily.   Marland Kitchen buPROPion (WELLBUTRIN XL) 150 MG 24 hr tablet one tablet twice daily (Patient taking differently: Take 150 mg by mouth 2 (two) times daily. )   . Continuous Blood Gluc Receiver (FREESTYLE LIBRE 14 DAY READER) DEVI 1 Device by Does not apply route daily. Use to check blood sugars every morning and 2 hours after largest meals   . Continuous Blood Gluc Sensor (FREESTYLE LIBRE  [place]) MISC 1 each by Does not apply route daily. Use to check blood sugars every morning fasting and 2 hours after largest meal   . doxycycline (VIBRAMYCIN) 100 MG capsule TAKE 1 CAPSULE BY MOUTH TWICE DAILY FOR 14 DAYS   . finasteride (PROSCAR) 5 MG tablet Take 5 mg by mouth daily.   Marland Kitchen gabapentin (NEURONTIN) 300 MG capsule TAKE 1 CAPSULE BY MOUTH IN THE MORNING AND 2 TABLETS AT DINNER TIME (Patient taking differently: Take 300-600 mg by mouth See admin instructions. TAKE 1 CAPSULE BY MOUTH IN THE MORNING AND 2 TABLETS AT [facility] TIME)   . glucose blood (FREESTYLE LITE) test strip Use to check blood sugars every morning fasting   . lisinopril (PRINIVIL,ZESTRIL) 5 MG tablet Take 0.5 tablets (2.5 mg total) by mouth daily.   . metFORMIN (GLUCOPHAGE) 1000 MG tablet Take 1 tablet (1,000 mg total) by mouth 2 (two) times daily with a meal.   . mirabegron ER (MYRBETRIQ) 25 MG TB24 tablet Take 25 mg by mouth daily.   . Multiple Vitamin (MULTIVITAMIN WITH MINERALS) TABS tablet Take 1 tablet by mouth daily. Men's One-A-Day Multivitamin   . nicotine (NICODERM CQ -  DOSED IN MG/24 HOURS) 21 mg/24hr patch Place 21 mg onto the skin daily. 09/12/2018: Placed last night right arm  . oxybutynin (DITROPAN) 5 MG tablet Take 1 tablet (5 mg total) by mouth every 8 (eight) hours as needed for bladder spasms.   . sildenafil (VIAGRA) 100 MG tablet Take 100 mg by mouth daily as needed for erectile dysfunction. 09/02/2018: On hold due to upcoming procedure.  . silodosin (RAPAFLO) 4 MG CAPS capsule Take 4 mg by mouth daily.   . tamsulosin (FLOMAX) 0.4 MG CAPS capsule Take 0.4 mg by mouth daily after supper.    . traMADol (ULTRAM) 50 MG tablet Take 1 tablet (50 mg total) by mouth every 6 (six) hours as needed.   . pneumococcal 23 valent vaccine (PNEUMOVAX 23) 25 MCG/0.5ML injection Inject 0.5 mLs into the muscle tomorrow at 10 am for 1 dose.   Marland Kitchen Zoster Vaccine Adjuvanted [facility]) injection Inject 0.5 mLs into the muscle once for 1 dose.   . [DISCONTINUED] dapagliflozin propanediol (FARXIGA) 5 MG TABS tablet Take 5 mg by mouth daily after supper.     No facility-administered encounter medications on file as of 01/21/2019.     Allergies: Bee venom; Oysters [shellfish allergy]; and Penicillins  Body mass index is 33.84 kg/m.  Blood pressure 121/71, pulse 87, temperature 97.9 F (36.6 C), temperature source Oral, height 5' 8.25" (1.734 m), weight 224 lb 3.2 oz (101.7 kg), SpO2 96 %.     Review of Systems  Constitutional: Positive for fatigue. Negative for activity change, appetite change, chills, diaphoresis and fever.  HENT: Negative for congestion.   Eyes: Negative for visual disturbance.  Respiratory: Negative for cough, chest tightness, shortness of breath, wheezing and stridor.   Cardiovascular: Negative for chest pain, palpitations and leg swelling.  Gastrointestinal: Negative for abdominal distention, anal bleeding, constipation, diarrhea, nausea, rectal pain and vomiting.  Endocrine: Negative for cold intolerance, heat intolerance, polydipsia, polyphagia and  polyuria.  Genitourinary: Negative for difficulty urinating and flank pain.  Musculoskeletal: Positive for arthralgias, back pain and myalgias. Negative for gait problem, joint swelling, neck pain and neck stiffness.  Skin: Negative for color change, pallor, rash and wound.  Neurological: Negative for dizziness and headaches.  Hematological: Does not bruise/bleed easily.  Psychiatric/Behavioral: Negative for agitation, behavioral problems, confusion, decreased concentration,  dysphoric mood, hallucinations, self-injury, sleep disturbance and suicidal ideas. The patient is not nervous/anxious and is not hyperactive.        Objective:   Physical Exam Constitutional:      General: He is not in acute distress.    Appearance: He is not ill-appearing, toxic-appearing or diaphoretic.  HENT:     Head: Normocephalic and atraumatic.     Right Ear: Tympanic membrane, ear canal and external ear normal. There is no impacted cerumen.     Left Ear: Tympanic membrane, ear canal and external ear normal. There is no impacted cerumen.     Nose: Nose normal. No congestion or rhinorrhea.  Eyes:     Extraocular Movements: Extraocular movements intact.     Conjunctiva/sclera: Conjunctivae normal.     Comments: Baseline-pinpoint pupils  Cardiovascular:     Rate and Rhythm: Normal rate.     Pulses: Normal pulses.     Heart sounds: Normal heart sounds. No murmur. No friction rub. No gallop.   Pulmonary:     Effort: Pulmonary effort is normal. No respiratory distress.     Breath sounds: Normal breath sounds. No stridor. No wheezing, rhonchi or rales.  Chest:     Chest wall: No tenderness.  Abdominal:     General: Bowel sounds are normal. There is no distension.     Palpations: Abdomen is soft. There is no mass.     Tenderness: There is no abdominal tenderness. There is no right CVA tenderness, left CVA tenderness, guarding or rebound.     Hernia: No hernia is present.  Genitourinary:    Comments: Declined  DRE/genital examination  Musculoskeletal:        General: Tenderness present.     Left foot: Tenderness and bony tenderness present.     Comments: L great toe- tender callus at tip Minor warmth noted No erythema noted   Skin:    General: Skin is warm and dry.     Capillary Refill: Capillary refill takes less than 2 seconds.  Neurological:     Mental Status: He is alert and oriented to person, place, and time.  Psychiatric:        Mood and Affect: Mood normal.        Behavior: Behavior normal.        Thought Content: Thought content normal.        Judgment: Judgment normal.       Assessment & Plan:   1. Diabetes mellitus type 2 in obese ([place])   2. Need for zoster vaccination   3. Need for influenza vaccination   4. Need for pneumococcal vaccination   5. Screening for colon cancer   6. Healthcare maintenance   7. Hyperlipidemia, unspecified hyperlipidemia type     Healthcare maintenance Continue all medications as directed- remember to take reduced dosage of Atorvastatin- only 20mg  once daily. Continue to drink plenty of water and remain as active as possible. Recommend to follow-up with Podiatrist , re: left great toe pain and callus removal. Recommend repeating CBC lab work after procedure and if WBC still elevated then referral to Hematologist.  Please keep Cardiology appt next month. Follow-up here in 3 months. Your mother is in our thoughts.   Hyperlipidemia Lipid Panel- reduced Atorvastatin 40mg  reduce to 20mg  QD Tot chol 83 HDL 34 LDL 29  Diabetes mellitus type 2 in obese [facility]) Lab Results  Component Value Date   HGBA1C 6.9 (H) 01/14/2019   HGBA1C 6.3 (A) 10/21/2018   HGBA1C  7.0 (A) 07/22/2018  AM BS80-120s Microalbumin- abnormal, on ACE    FOLLOW-UP:  Return in about 3 months (around 04/22/2019) for Regular Follow Up.

## 2019-01-21 ENCOUNTER — Ambulatory Visit (INDEPENDENT_AMBULATORY_CARE_PROVIDER_SITE_OTHER): Payer: PPO | Admitting: Adult Health

## 2019-01-21 ENCOUNTER — Encounter: Payer: Self-pay | Admitting: Adult Health

## 2019-01-21 VITALS — BP 121/71 | HR 87 | Temp 97.9°F | Ht 68.25 in | Wt 224.2 lb

## 2019-01-21 DIAGNOSIS — E669 Obesity, unspecified: Secondary | ICD-10-CM | POA: Diagnosis not present

## 2019-01-21 DIAGNOSIS — E1169 Type 2 diabetes mellitus with other specified complication: Secondary | ICD-10-CM | POA: Diagnosis not present

## 2019-01-21 DIAGNOSIS — Z1211 Encounter for screening for malignant neoplasm of colon: Secondary | ICD-10-CM | POA: Diagnosis not present

## 2019-01-21 DIAGNOSIS — Z23 Encounter for immunization: Secondary | ICD-10-CM

## 2019-01-21 DIAGNOSIS — Z0000 Encounter for general adult medical examination without abnormal findings: Secondary | ICD-10-CM

## 2019-01-21 DIAGNOSIS — E785 Hyperlipidemia, unspecified: Secondary | ICD-10-CM | POA: Diagnosis not present

## 2019-01-21 LAB — POCT UA - MICROALBUMIN
Creatinine, POC: 200 mg/dL
Microalbumin Ur, POC: 150 mg/L

## 2019-01-21 MED ORDER — ZOSTER VAC RECOMB ADJUVANTED 50 MCG/0.5ML IM SUSR: 0.5000 mL | Freq: Once | INTRAMUSCULAR | 0 refills | Status: AC

## 2019-01-21 MED ORDER — PNEUMOCOCCAL VAC POLYVALENT 25 MCG/0.5ML IJ INJ: 0.5000 mL | INJECTION | INTRAMUSCULAR | 0 refills | Status: AC

## 2019-01-21 NOTE — Assessment & Plan Note (Addendum)
Lab Results  Component Value Date   HGBA1C 6.9 (H) 01/14/2019   HGBA1C 6.3 (A) 10/21/2018   HGBA1C 7.0 (A) 07/22/2018  AM BS80-120s Microalbumin- abnormal, on ACE

## 2019-01-21 NOTE — Assessment & Plan Note (Signed)
Lipid Panel- reduced Atorvastatin 40mg  reduce to 20mg  QD Tot chol 83 HDL 34 LDL 29

## 2019-01-21 NOTE — Patient Instructions (Addendum)

## 2019-01-21 NOTE — Assessment & Plan Note (Signed)
Continue all medications as directed- remember to take reduced dosage of Atorvastatin- only 20mg  once daily. Continue to drink plenty of water and remain as active as possible. Recommend to follow-up with Podiatrist , re: left great toe pain and callus removal. Recommend repeating CBC lab work after procedure and if WBC still elevated then referral to Hematologist.  Please keep Cardiology appt next month. Follow-up here in 3 months. Your mother is in our thoughts.

## 2019-01-21 NOTE — Assessment & Plan Note (Signed)
>>  ASSESSMENT AND PLAN FOR HYPERLIPIDEMIA WRITTEN ON 01/21/2019  1:52 PM BY DANFORD, KATY D, NP  Lipid Panel- reduced Atorvastatin 40mg  reduce to 20mg  QD Tot chol 83 HDL 34 LDL 29

## 2019-01-23 ENCOUNTER — Other Ambulatory Visit: Payer: Self-pay | Admitting: Adult Health

## 2019-01-24 ENCOUNTER — Encounter: Payer: Self-pay | Admitting: Podiatry

## 2019-01-24 ENCOUNTER — Ambulatory Visit: Payer: PPO | Admitting: Podiatry

## 2019-01-24 DIAGNOSIS — L02612 Cutaneous abscess of left foot: Secondary | ICD-10-CM

## 2019-01-24 DIAGNOSIS — Q828 Other specified congenital malformations of skin: Secondary | ICD-10-CM

## 2019-01-24 DIAGNOSIS — L03032 Cellulitis of left toe: Secondary | ICD-10-CM | POA: Diagnosis not present

## 2019-01-24 NOTE — Progress Notes (Signed)
Subjective:   Patient ID: [name], male   DOB: 68 y.o.   MRN: [id number]   HPI Patient states it seems to be getting better and it is finally starting to heal   ROS      Objective:  Physical Exam  Neurovascular status intact with crusted area on the dorsum of the left hallux that is improved but still present     Assessment:  History of ulceration with lesion formation distal aspect left hallux     Plan:  Reviewed ulceration and possibility for reoccurrence and dispensed padding to take pressure off the toe and debrided the lesion with no current indication of breakdown of tissue

## 2019-01-28 ENCOUNTER — Encounter: Payer: Self-pay | Admitting: Cardiovascular Disease

## 2019-01-28 ENCOUNTER — Other Ambulatory Visit: Payer: Self-pay | Admitting: Cardiovascular Disease

## 2019-01-28 ENCOUNTER — Other Ambulatory Visit: Payer: Self-pay | Admitting: Adult Health

## 2019-01-28 ENCOUNTER — Ambulatory Visit (INDEPENDENT_AMBULATORY_CARE_PROVIDER_SITE_OTHER): Payer: PPO | Admitting: Cardiovascular Disease

## 2019-01-28 VITALS — BP 112/64 | HR 74 | Ht 68.5 in | Wt 226.2 lb

## 2019-01-28 DIAGNOSIS — I480 Paroxysmal atrial fibrillation: Secondary | ICD-10-CM

## 2019-01-28 DIAGNOSIS — I10 Essential (primary) hypertension: Secondary | ICD-10-CM | POA: Diagnosis not present

## 2019-01-28 DIAGNOSIS — I872 Venous insufficiency (chronic) (peripheral): Secondary | ICD-10-CM | POA: Diagnosis not present

## 2019-01-28 DIAGNOSIS — E668 Other obesity: Secondary | ICD-10-CM

## 2019-01-28 DIAGNOSIS — E669 Obesity, unspecified: Secondary | ICD-10-CM | POA: Diagnosis not present

## 2019-01-28 DIAGNOSIS — E1169 Type 2 diabetes mellitus with other specified complication: Secondary | ICD-10-CM | POA: Diagnosis not present

## 2019-01-28 DIAGNOSIS — Z720 Tobacco use: Secondary | ICD-10-CM | POA: Diagnosis not present

## 2019-01-28 DIAGNOSIS — I2510 Atherosclerotic heart disease of native coronary artery without angina pectoris: Secondary | ICD-10-CM

## 2019-01-28 DIAGNOSIS — Z7901 Long term (current) use of anticoagulants: Secondary | ICD-10-CM

## 2019-01-28 DIAGNOSIS — I739 Peripheral vascular disease, unspecified: Secondary | ICD-10-CM

## 2019-01-28 DIAGNOSIS — Z4509 Encounter for adjustment and management of other cardiac device: Secondary | ICD-10-CM

## 2019-01-28 DIAGNOSIS — E785 Hyperlipidemia, unspecified: Secondary | ICD-10-CM | POA: Insufficient documentation

## 2019-01-28 MED ORDER — SILODOSIN 4 MG PO CAPS: 4.0000 mg | ORAL_CAPSULE | Freq: Every day | ORAL | 0 refills | Status: DC

## 2019-01-28 MED ORDER — APIXABAN 5 MG PO TABS: ORAL_TABLET | ORAL | 3 refills | Status: DC

## 2019-01-28 NOTE — Progress Notes (Signed)
Cardiology Office Note    Date:  01/28/2019   ID:  [name], DOB 1951/06/10, MRN [id number]  PCP:  Esaw Grandchild, NP  Cardiologist:   Sanda Klein, MD   chief complaint: Atrial fibrillation follow-up Exertional leg pain  History of Present Illness:  [name] is a 68 y.o. male with coronary artery disease, CABG 4 in 2009, prior inferior wall infarction, hypertension, hyperlipidemia, type 2 diabetes mellitus, venous insufficiency of the lower extremities, previous right frontal cortical infarct, asymptomatic atrial fibrillation detected by implantable loop recorder.  As before, his loop recorder shows infrequent episodes of paroxysmal atrial fibrillation generally lasting around 1-1.5 hours.  The overall burden is very low, less than 0.1%.  The most recent episode occurred in late December.  With a mean ventricular rate of 128 bpm, but lasted for only 2 minutes.  He is unaware of arrhythmia when it occurs.  His cholesterol level has been excellent in fact with a remarkably low LDL in the 20s, so his PCP reduced his dose of atorvastatin to 20 mg daily.  Glucose control is acceptable with a hemoglobin A1c of 6.9%.  Unfortunately continues to smoke about three quarters of a pack a day.  He describes persistent numbness in his left lower extremity that is present whether or not he is active, but also has "cramps" in the left calf with walking.  He wonders whether it could be because of arterial blockages.  He had a vascular screen performed in August 2019 that did not show any evidence of focal stenoses in the aorta or iliac arteries, mild abdominal aortic ectasia with a maximum diameter of 2.4 cm.  Plaque was seen, but ot causing any obstruction.  He did not have any meaningful carotid stenoses by ultrasound in 2017.  The patient specifically denies any chest pain at rest or exertion, dyspnea at rest or with exertion, orthopnea, paroxysmal nocturnal dyspnea, syncope,  palpitations, focal neurological deficits,  lower extremity edema (he always wears compression stockings), unexplained weight gain, cough, hemoptysis or wheezing.  He has chronic lower extremity edema and hyperpigmentation as well as varicose veins, not changed recently.   He spends nights with his elderly mother, suffering from Alzheimer's dementia.  TEE performed August 21 did not show clear evidence of an embolic source, although there was late arrival of saline contrast and left atrium felt to be secondary to pulmonary shunt. LVEF was 45-50% with inferior wall hypokinesis and there was mild mitral regurgitation. A loop recorder was implanted the same day (Dr. Cristopher Peru). Carotid duplex ultrasound showed only mild plaque in the bilateral internal carotid arteries and was antegrade flow in the vertebral arteries.   Past Medical History:  Diagnosis Date  . Abdominal aortic aneurysm (AAA) ([place])    3 cm per 07-02-18 US abdominal aorta US epic  . Chronic kidney disease    renal calculi  . Chronic venous insufficiency LOWER EXTREMITIES  . Coronary artery disease CARDIOLOGIST - DR  WNIOEVOJ-  LAST 1 WK AGO -- WILL REQUEST NOTE AND STRESS TEST  . Diabetes mellitus without complication ([place])    type 2  . Hematuria    last year  . Hyperglycemia   . Hyperlipidemia   . Hypertension   . Mixed dyslipidemia   . Myocardial infarction ([place])   . Neuropathy    toes only  . Nocturia   . Prostate cancer ([place]) 08/18/11   gleason 8, volume 24.4cc  . S/P CABG x 4   .  ST elevation MI (STEMI) ([place]) 02-10-2008   S/P CABG  . Stroke [facility]) 2016   occ trouble wriing with right hand  . Urinary hesitancy   . Vision abnormalities    resolved now  . Wound discharge    since jan 2019 goes to wound center [place] left great toe small open area changes dressing q 2 days with ointment provided by wound center, clear drainage occ    Past Surgical History:  Procedure Laterality Date  . CARDIAC  CATHETERIZATION  05/01/2012   grafts widely patent  . CARDIOVASCULAR STRESS TEST  03-15-2010   INFERIOR WALL SCAR WITHOUT ANY MEANINGFUL ISCHEMIA/ EF 46% / LOW RISK SCAN  . CORONARY ARTERY BYPASS GRAFT  02-10-2008  DR Einar Gip   X4 VESSEL DISEASE / [facility] CABG  . CYSTOSCOPY  02/08/2012   Procedure: CYSTOSCOPY FLEXIBLE;  Surgeon: Franchot Gallo, MD;  Location: [facility];  Service: Urology;;  . [place], URETEROSCOPY AND STENT PLACEMENT Right 09/12/2018   Procedure: CYSTOSCOPY WITH RETROGRADE PYELOGRAM, URETEROSCOPY AND STENT PLACEMENT, STONE EXTRACTION;  Surgeon: Franchot Gallo, MD;  Location: [facility];  Service: Urology;  Laterality: Right;  . EP IMPLANTABLE DEVICE N/A 08/14/2016   Procedure: Loop Recorder Insertion;  Surgeon: Evans Lance, MD;  Location: [facility];  Service: Cardiovascular;  Laterality: N/A;  . EXTRACORPOREAL SHOCK WAVE LITHOTRIPSY  2013  . HOLMIUM LASER APPLICATION Right 4/97/0263   Procedure: HOLMIUM LASER APPLICATION;  Surgeon: Franchot Gallo, MD;  Location: [facility];  Service: Urology;  Laterality: Right;  . KNEE SURGERY  age 9   RIGHT  . LEFT HEART CATHETERIZATION WITH CORONARY/GRAFT ANGIOGRAM N/A 05/01/2012   Procedure: LEFT HEART CATHETERIZATION WITH Beatrix Fetters;  Surgeon: Sanda Klein, MD;  Location: [facility] [facility] C[facility];  Service: Cardiovascular;  Laterality: N/A;  . MULTIPLE TEETH EXTRACTIONS (23)/ FOUR QUADRANT ALVEOLPLASTY/ MANDIBULAR LATERAL EXOSTOSES REDUCTIONS  03-24-2010   CHRONIC PERIODONTITIS  . RADIOACTIVE SEED IMPLANT  02/08/2012   Procedure: RADIOACTIVE SEED IMPLANT;  Surgeon: Franchot Gallo, MD;  [facility];  Service: Urology;  Laterality: N/A;  C-ARM   . SHOULDER SURGERY  1982   LEFT  . TEE WITHOUT CARDIOVERSION N/A 08/14/2016   Procedure: TRANSESOPHAGEAL ECHOCARDIOGRAM (TEE);  Surgeon: Josue Hector, MD;  Location: [facility] ENDOSCOPY;  Service: Cardiovascular;   Laterality: N/A;  . URETERAL STENT PLACEMENT      Current Medications: Outpatient Medications Prior to Visit  Medication Sig Dispense Refill  . atorvastatin (LIPITOR) 20 MG tablet Take 1 tablet (20 mg total) by mouth daily. 90 tablet 1  . buPROPion (WELLBUTRIN XL) 150 MG 24 hr tablet one tablet twice daily (Patient taking differently: Take 150 mg by mouth 2 (two) times daily. ) 180 tablet 1  . Continuous Blood Gluc Receiver (FREESTYLE LIBRE 14 DAY READER) DEVI 1 Device by Does not apply route daily. Use to check blood sugars every morning and 2 hours after largest meals 1 Device 0  . Continuous Blood Gluc Sensor (FREESTYLE LIBRE 14 DAY SENSOR) MISC 1 each by Does not apply route daily. Use to check blood sugars every morning fasting and 2 hours after largest meal 12 each 2  . finasteride (PROSCAR) 5 MG tablet Take 5 mg by mouth daily.  11  . gabapentin (NEURONTIN) 300 MG capsule TAKE 1 CAPSULE BY MOUTH IN THE MORNING AND 2 TABLETS AT DINNER TIME (Patient taking differently: Take 300-600 mg by mouth See admin instructions. TAKE 1 CAPSULE BY MOUTH IN THE  MORNING AND 2 TABLETS AT DINNER TIME) 360 capsule 0  . glucose blood (FREESTYLE LITE) test strip Use to check blood sugars every morning fasting 100 each 12  . lisinopril (PRINIVIL,ZESTRIL) 5 MG tablet Take 0.5 tablets (2.5 mg total) by mouth daily. 90 tablet 3  . metFORMIN (GLUCOPHAGE) 1000 MG tablet Take 1 tablet (1,000 mg total) by mouth 2 (two) times daily with a meal. 180 tablet 2  . mirabegron ER (MYRBETRIQ) 25 MG TB24 tablet Take 25 mg by mouth daily.    . Multiple Vitamin (MULTIVITAMIN WITH MINERALS) TABS tablet Take 1 tablet by mouth daily. Men's One-A-Day Multivitamin    . nicotine (NICODERM CQ - DOSED IN MG/24 HOURS) 21 mg/24hr patch Place 21 mg onto the skin daily.    Marland Kitchen oxybutynin (DITROPAN) 5 MG tablet Take 1 tablet (5 mg total) by mouth every 8 (eight) hours as needed for bladder spasms. 30 tablet 1  . sildenafil (VIAGRA) 100 MG  tablet Take 100 mg by mouth daily as needed for erectile dysfunction.    . traMADol (ULTRAM) 50 MG tablet Take 1 tablet (50 mg total) by mouth every 6 (six) hours as needed. 10 tablet 0  . apixaban (ELIQUIS) 5 MG TABS tablet Take 1 tablet by mouth twice daily.  Please schedule MD appt for further refills 60 tablet 0  . silodosin (RAPAFLO) 4 MG CAPS capsule Take 4 mg by mouth daily.    . tamsulosin (FLOMAX) 0.4 MG CAPS capsule TAKE 1 CAPSULE BY MOUTH AT BEDTIME 90 capsule 0   No facility-administered medications prior to visit.      Allergies:   Bee venom; Oysters [shellfish allergy]; and Penicillins   Social History   Socioeconomic History  . Marital status: Married    Spouse name: Not on file  . Number of children: Not on file  . Years of education: Not on file  . Highest education level: Not on file  Occupational History  . Not on file  Social Needs  . Financial resource strain: Not on file  . Food insecurity:    Worry: Not on file    Inability: Not on file  . Transportation needs:    Medical: Not on file    Non-medical: Not on file  Tobacco Use  . Smoking status: Current Every Day Smoker    Packs/day: 1.50    Years: 50.00    Pack years: 75.00    Types: Cigarettes  . Smokeless tobacco: Never Used  . Tobacco comment: PREVIOUSLY SMOKED 3PPD / DECREASED TO 1PPD SINCE 2009  Substance and Sexual Activity  . Alcohol use: Not Currently    Alcohol/week: 2.0 standard drinks    Types: 1 Cans of beer, 1 Shots of liquor per week    Comment: none in 3 years  . Drug use: No  . Sexual activity: Never  Lifestyle  . Physical activity:    Days per week: Not on file    Minutes per session: Not on file  . Stress: Not on file  Relationships  . Social connections:    Talks on phone: Not on file    Gets together: Not on file    Attends religious service: Not on file    Active member of club or organization: Not on file    Attends meetings of clubs or organizations: Not on file     Relationship status: Not on file  Other Topics Concern  . Not on file  Social History Narrative  . Not on  file     Family History:  The patient's family history includes Cancer in his father; Diabetes in his father.   ROS:   Please see the history of present illness.    ROS all other systems are reviewed and are negative  PHYSICAL EXAM:   VS:  BP 112/64   Pulse 74   Ht 5' 8.5" (1.74 m)   Wt 226 lb 3.2 oz (102.6 kg)   BMI 33.89 kg/m     General: Alert, oriented x3, no distress, obese Head: no evidence of trauma, PERRL, EOMI, no exophtalmos or lid lag, no myxedema, no xanthelasma; normal ears, nose and oropharynx Neck: normal jugular venous pulsations and no hepatojugular reflux; brisk carotid pulses without delay and no carotid bruits Chest: clear to auscultation, no signs of consolidation by percussion or palpation, normal fremitus, symmetrical and full respiratory excursions Cardiovascular: normal position and quality of the apical impulse, regular rhythm, normal first and second heart sounds, no murmurs, rubs or gallops Abdomen: no tenderness or distention, no masses by palpation, no abnormal pulsatility or arterial bruits, normal bowel sounds, no hepatosplenomegaly Extremities: no clubbing, cyanosis or edema; 2+ radial, ulnar and brachial pulses bilaterally; 2+ right femoral, posterior tibial and dorsalis pedis pulses; 2+ left femoral, posterior tibial and dorsalis pedis pulses; no subclavian or femoral bruits Neurological: grossly nonfocal Psych: Normal mood and affect    Wt Readings from Last 3 Encounters:  01/28/19 226 lb 3.2 oz (102.6 kg)  01/21/19 224 lb 3.2 oz (101.7 kg)  10/21/18 226 lb 4.8 oz (102.6 kg)      Studies/Labs Reviewed:   EKG:  EKG ordered today shows sinus rhythm, old Q waves seen in the inferior leads as well as V5-V6, chronic T wave inversion in the lateral leads, QTC 395 ms.    Recent Labs: 03/14/2018: Magnesium 1.7 01/14/2019: ALT 18; BUN 13;  Creatinine, Ser 0.78; Hemoglobin 14.0; Platelets 257; Potassium 4.5; Sodium 139; TSH 2.350   Lipid Panel    Component Value Date/Time   CHOL 83 (L) 01/14/2019 0850   TRIG 98 01/14/2019 0850   HDL 34 (L) 01/14/2019 0850   CHOLHDL 2.4 01/14/2019 0850   CHOLHDL 6.7 08/12/2016 0048   VLDL 66 (H) 08/12/2016 0048   LDLCALC 29 01/14/2019 0850     ASSESSMENT:    1. Paroxysmal atrial fibrillation (HCC)   2. Long term (current) use of anticoagulants   3. Coronary artery disease involving native coronary artery of native heart without angina pectoris   4. Essential hypertension   5. Dyslipidemia (high LDL; low HDL)   6. Diabetes mellitus type 2 in obese ([place])   7. Tobacco abuse   8. Moderate obesity   9. Encounter for loop recorder check   10. Peripheral venous insufficiency   11. PAD (peripheral artery disease) (HCC)      PLAN:  In order of problems listed above:    1. AFib: Remains infrequent and asymptomatic.  No need for rate control/antiarrhythmics.  On anticoagulation.  CHADSVasc 5 (CVA, HTN, DM, CAD).  2. Eliquis: well-tolerated.  He has had some occasional problems with hematuria related to nephrolithiasis, but no recent serious bleeding. 3. CAD: Angina free. 4. HTN: Blood pressure control is excellent.  No longer with symptoms of hypotension after reducing his dose of ACE inhibitor. 5. HLP: Outstanding LDL cholesterol level.  HDL is unfortunately also low and is unlikely to improve without substantial weight loss. 6. DM: Excellent glycemic control, greatly improved since he restarted taking his medications.  7. Tobacco abuse: Reviewed the importance of smoking cessation with him again.  He states that he is trying. 8. Obesity: Remains moderately obese.  Encouraged weight loss. 9. ILR: Is likely that his loop recorder will reach end of service this summer.  No reason to reimplant.  Explant of loop recorder if he chooses. 10. Venous insufficiency of the lower extremities: Well  managed with compression stockings.  Has chronic hyperpigmentation and minimal edema. Keep legs elevated. 11. Possible intermittent claudication: Most of the symptoms sound more consistent with diabetic neuropathy than they do with intermittent claudication, but the cramps are related to walking.  We will check ABIs.    Medication Adjustments/Labs and Tests Ordered: Current medicines are reviewed at length with the patient today.  Concerns regarding medicines are outlined above.  Medication changes, Labs and Tests ordered today are listed in the Patient Instructions below. Patient Instructions  Medication Instructions:  Your Physician recommend you continue on your current medication as directed.    If you need a refill on your cardiac medications before your next appointment, please call your pharmacy.   Lab work: None   Testing/Procedures: Your physician has requested that you have an ankle brachial index (ABI). During this test an ultrasound and blood pressure cuff are used to evaluate the arteries that supply the arms and legs with blood. Allow thirty minutes for this exam. There are no restrictions or special instructions. [place]. [address]   Follow-Up: At [facility], you and your health needs are our priority.  As part of our continuing mission to provide you with exceptional heart care, we have created designated Provider Care Teams.  These Care Teams include your primary Cardiologist (physician) and Advanced Practice Providers (APPs -  Physician Assistants and Nurse Practitioners) who all work together to provide you with the care you need, when you need it. You will need a follow up appointment in 1 years.  Please call our office 2 months in advance to schedule this appointment.  You may see Sanda Klein, MD or one of the following Advanced Practice Providers on your designated Care Team: [place], [place] . Fabian Sharp, PA-C       Signed, Sanda Klein, MD    01/28/2019 10:03 AM    [facility] [address], [place], [place]  [address] Phone: [phone]; Fax: [phone]

## 2019-01-28 NOTE — Patient Instructions (Addendum)
Medication Instructions:  Your Physician recommend you continue on your current medication as directed.    If you need a refill on your cardiac medications before your next appointment, please call your pharmacy.   Lab work: None   Testing/Procedures: Your physician has requested that you have an ankle brachial index (ABI). During this test an ultrasound and blood pressure cuff are used to evaluate the arteries that supply the arms and legs with blood. Allow thirty minutes for this exam. There are no restrictions or special instructions. [place]. [address]   Follow-Up: At [facility], you and your health needs are our priority.  As part of our continuing mission to provide you with exceptional heart care, we have created designated Provider Care Teams.  These Care Teams include your primary Cardiologist (physician) and Advanced Practice Providers (APPs -  Physician Assistants and Nurse Practitioners) who all work together to provide you with the care you need, when you need it. You will need a follow up appointment in 1 years.  Please call our office 2 months in advance to schedule this appointment.  You may see Sanda Klein, MD or one of the following Advanced Practice Providers on your designated Care Team: [place], [place] . Fabian Sharp, PA-C

## 2019-02-04 ENCOUNTER — Ambulatory Visit (INDEPENDENT_AMBULATORY_CARE_PROVIDER_SITE_OTHER): Payer: PPO

## 2019-02-04 DIAGNOSIS — I63411 Cerebral infarction due to embolism of right middle cerebral artery: Secondary | ICD-10-CM | POA: Diagnosis not present

## 2019-02-05 ENCOUNTER — Other Ambulatory Visit: Payer: Self-pay | Admitting: Pharmacist

## 2019-02-05 ENCOUNTER — Other Ambulatory Visit: Payer: Self-pay | Admitting: Adult Health

## 2019-02-05 LAB — CUP PACEART REMOTE DEVICE CHECK
Date Time Interrogation Session: 20200212013600
MDC IDC PG IMPLANT DT: 20170821

## 2019-02-05 MED ORDER — ATORVASTATIN CALCIUM 20 MG PO TABS: 20.0000 mg | ORAL_TABLET | Freq: Every day | ORAL | 1 refills | Status: DC

## 2019-02-05 MED ORDER — METFORMIN HCL ER (MOD) 1000 MG PO TB24: ORAL_TABLET | ORAL | 3 refills | Status: DC

## 2019-02-05 NOTE — Patient Outreach (Signed)
[place] [facility]) Care Management  02/05/2019  [name] Jun 17, 1951 [id number]  Patient failed statin, ACEi/ARB, and diabetes adherence measures in 2019. Screening patient to determine if any pharmacy intervention would benefit medication adherence for 2020.  It appears patient's atorvastatin "non-adherence" last year a was due to the fact that he was decreased from atorvastatin 40 mg daily to 20 mg (1/2 tablet) daily, therefore a 90 day supply of atorvastatin lasted 180 days, making him look non-adherent. Contacted Location manager. They do not have an atorvastatin 20 mg tablet prescription on file, just 40 mg. Atorva 40 mg, written to take 1 tablet daily, was last filled 10/04/2018, therefore this will last him until 03/2019 if he is only taking 1 tablet daily. If he fills the atorvastatin 40 mg prescription again at that time, he will continue to appear nonadherent through 2020. Will contact PCP Mina Marble, NP to ask to send the atorvastatin 20 mg 1 tab daily prescription to the pharmacy to be placed on hold until patient needs, so that he does not appear non-adherent again this year.  It is documented in a few PCP notes that the patient was occasionally missing his evening metformin dose. Could consider changing him to extended release metformin so that he could try taking the entire dose at once in the morning. Unfortunately, his insurance does not cover the 1000 mg XR formulation, so he would need to take 4 500 mg XR tablets daily.   Uncertain why he appeared "non-adherent" to lisinopril therapy. It is noted that the patient is involved in caring for his ailing mother, and that this is a significant stressor for him.   Contacted patient; left HIPAA compliant message for him to return my call at his convenience.   Catie Darnelle Maffucci, PharmD, [place] PGY2 Ambulatory Care Pharmacy Resident, [place] [facility] Phone: [phone]

## 2019-02-06 ENCOUNTER — Encounter: Payer: Self-pay | Admitting: Physician Assistant

## 2019-02-07 ENCOUNTER — Other Ambulatory Visit: Payer: Self-pay | Admitting: Adult Health

## 2019-02-07 ENCOUNTER — Other Ambulatory Visit: Payer: Self-pay | Admitting: Pharmacist

## 2019-02-07 MED ORDER — METFORMIN HCL ER 500 MG PO TB24: ORAL_TABLET | ORAL | 3 refills | Status: DC

## 2019-02-07 NOTE — Patient Outreach (Signed)
[place] [facility]) Care Management  02/07/2019  TARIS GALINDO Dec 01, 1951 [id number]   Received message back from [facility]; metformin XR 500 mg tablets were sent in.   Contacted [facility]; this is covered on the patient's insurance. They have also canceled refills on the atorvastatin 40 mg tablet prescription.   Catie Darnelle Maffucci, PharmD, [place] PGY2 Ambulatory Care Pharmacy Resident, [place] [facility] Phone: [phone]

## 2019-02-07 NOTE — Patient Outreach (Addendum)
[place] [facility]) Care Management  02/07/2019  KHALIN ROYCE Jun 16, 1951 [id number]  Received message back from Mina Marble, NP. Prescriptions for the corrected atorvastatin 20 mg daily (instead of 40 mg daily) and the extended release metformin 1000 mg tablets were sent to the patient's pharmacy.   Contacted pharmacy; the XR 1000 mg tablets are not covered, but XR 500 mg are. Messaged this information to [employer].   Attempted to contact patient to discuss medications; left HIPAA compliant message for him to return my call.    Catie Darnelle Maffucci, PharmD, [place] PGY2 Ambulatory Care Pharmacy Resident, [place] [facility] Phone: [phone]

## 2019-02-11 ENCOUNTER — Ambulatory Visit: Payer: Self-pay | Admitting: Pharmacist

## 2019-02-11 ENCOUNTER — Other Ambulatory Visit: Payer: Self-pay | Admitting: Pharmacist

## 2019-02-11 NOTE — Patient Outreach (Signed)
[place] [facility]) Care Management  02/11/2019  [name] 1951/03/11 [id number]  Contacted patient to f/u on medication changes made last week after discussion with PCP Mina Marble, NP. We discussed the new prescription for atorvastatin 20 mg daily; he notes that he picked this up from the pharmacy yesterday. He has a few pills remaining of the atorvastatin 40 mg that he plans to use up first before starting the atorvastatin 20 mg tablets. We also discussed the reason for switching to extended release metformin, so that he could take the entire dose first thing in the morning, instead of splitting BID, hopefully with improved adherence. He noted that he also picked this prescription up at the pharmacy yesterday, and verbalized understanding. Denied any further questions or concerns at this time.   Patient has my contact information with any future questions.   Catie Darnelle Maffucci, PharmD, [place] PGY2 Ambulatory Care Pharmacy Resident, [place] [facility] Phone: [phone]

## 2019-02-12 ENCOUNTER — Other Ambulatory Visit: Payer: Self-pay | Admitting: Cardiovascular Disease

## 2019-02-12 DIAGNOSIS — I739 Peripheral vascular disease, unspecified: Secondary | ICD-10-CM

## 2019-02-17 ENCOUNTER — Encounter: Payer: Self-pay | Admitting: Physician Assistant

## 2019-02-17 ENCOUNTER — Telehealth: Payer: Self-pay

## 2019-02-17 ENCOUNTER — Ambulatory Visit (INDEPENDENT_AMBULATORY_CARE_PROVIDER_SITE_OTHER): Payer: PPO | Admitting: Physician Assistant

## 2019-02-17 VITALS — BP 122/72 | HR 76 | Ht 68.5 in | Wt 234.0 lb

## 2019-02-17 DIAGNOSIS — Z1211 Encounter for screening for malignant neoplasm of colon: Secondary | ICD-10-CM | POA: Diagnosis not present

## 2019-02-17 DIAGNOSIS — Z7901 Long term (current) use of anticoagulants: Secondary | ICD-10-CM

## 2019-02-17 MED ORDER — NA SULFATE-K SULFATE-MG SULF 17.5-3.13-1.6 GM/177ML PO SOLN: ORAL | 0 refills | Status: DC

## 2019-02-17 NOTE — Telephone Encounter (Signed)
Pt takes Eliquis for afib with CHADS2VASc score 6 (age, HTN, CAD, DM, stroke). Renal function is normal. Ok to hold Eliquis for only 24 hours prior to procedure, resume as soon as safely possible after due to history of stroke.

## 2019-02-17 NOTE — Telephone Encounter (Signed)
Spoke with patient regarding holding Eliquis  24 hours prior to procedure.  Okayed to hold.  Patient verbalized understanding.

## 2019-02-17 NOTE — Progress Notes (Signed)
Carelink Summary Report / Loop Recorder 

## 2019-02-17 NOTE — Patient Instructions (Signed)
If you are age 67 or older, your body mass index should be between 23-30. Your Body mass index is 35.06 kg/m. If this is out of the aforementioned range listed, please consider follow up with your Primary Care Provider.  If you are age 29 or younger, your body mass index should be between 19-25. Your Body mass index is 35.06 kg/m. If this is out of the aformentioned range listed, please consider follow up with your Primary Care Provider.   You have been scheduled for a colonoscopy. Please follow written instructions given to you at your visit today.  Please pick up your prep supplies at the pharmacy within the next 1-3 days. If you use inhalers (even only as needed), please bring them with you on the day of your procedure. Your physician has requested that you go to www.startemmi.com and enter the access code given to you at your visit today. This web site gives a general overview about your procedure. However, you should still follow specific instructions given to you by our office regarding your preparation for the procedure.  We have sent the following medications to your pharmacy for you to pick up at your convenience: [place] will be contacted by our office prior to your procedure for directions on holding your Eliquis.  If you do not hear from our office 1 week prior to your scheduled procedure, please call [phone] to discuss.   Thank you for choosing me and [facility].  Amy E[place]wood, PA-C

## 2019-02-17 NOTE — Progress Notes (Signed)
Subjective:    Patient ID: [name], male    DOB: 1951-04-18, 68 y.o.   MRN: [id number]  HPI Sarkis Rhines" is a pleasant 68 year old white male, new to GI today referred by Olene Floss, NP for screening colonoscopy.  Patient has not had any prior colon evaluation. Family history is negative as far as he is aware. He denies any current issues with abdominal pain, changes in bowel habits melena or hematochezia.  No chronic issues with heartburn indigestion or GERD.  Patient does have history of atrial fibrillation, and is maintained on Eliquis followed by Dr. Croitoru-cardiology.   Also with history of coronary artery disease status post CABG x4 in 2009, peripheral vascular disease, history of small CVA with visual disturbance about 2 years ago.  Also with adult onset diabetes mellitus, hypertension and history of prostate cancer for which he underwent seed implants 6 or 7 years ago.  Review of Systems Pertinent positive and negative review of systems were noted in the above HPI section.  All other review of systems was otherwise negative.  Outpatient Encounter Medications as of 02/17/2019  Medication Sig  . apixaban (ELIQUIS) 5 MG TABS tablet Take 1 tablet by mouth twice daily.  Please schedule MD appt for further refills  . atorvastatin (LIPITOR) 20 MG tablet Take 1 tablet (20 mg total) by mouth daily.  Marland Kitchen buPROPion (WELLBUTRIN XL) 150 MG 24 hr tablet one tablet twice daily (Patient taking differently: Take 150 mg by mouth 2 (two) times daily. )  . Continuous Blood Gluc Receiver (FREESTYLE LIBRE 14 DAY READER) DEVI 1 Device by Does not apply route daily. Use to check blood sugars every morning and 2 hours after largest meals  . Continuous Blood Gluc Sensor (FREESTYLE LIBRE 14 DAY SENSOR) MISC 1 each by Does not apply route daily. Use to check blood sugars every morning fasting and 2 hours after largest meal  . finasteride (PROSCAR) 5 MG tablet Take 5 mg by mouth daily.  Marland Kitchen gabapentin  (NEURONTIN) 300 MG capsule TAKE 1 CAPSULE BY MOUTH IN THE MORNING AND 2 AT [facility] TIME  . glucose blood (FREESTYLE LITE) test strip Use to check blood sugars every morning fasting  . lisinopril (PRINIVIL,ZESTRIL) 5 MG tablet Take 0.5 tablets (2.5 mg total) by mouth daily.  . metFORMIN (GLUCOPHAGE-XR) 500 MG 24 hr tablet Take 4 tablets daily with breakfast  . mirabegron ER (MYRBETRIQ) 25 MG TB24 tablet Take 25 mg by mouth daily.  . Multiple Vitamin (MULTIVITAMIN WITH MINERALS) TABS tablet Take 1 tablet by mouth daily. Men's One-A-Day Multivitamin  . nicotine (NICODERM CQ - DOSED IN MG/24 HOURS) 21 mg/24hr patch Place 21 mg onto the skin daily.  Marland Kitchen oxybutynin (DITROPAN) 5 MG tablet Take 1 tablet (5 mg total) by mouth every 8 (eight) hours as needed for bladder spasms.  . sildenafil (VIAGRA) 100 MG tablet Take 100 mg by mouth daily as needed for erectile dysfunction.  . silodosin (RAPAFLO) 4 MG CAPS capsule TAKE 1 CAPSULE BY MOUTH ONCE DAILY WITH BREAKFAST  . tamsulosin (FLOMAX) 0.4 MG CAPS capsule Take 0.4 mg by mouth daily after supper.  . Na Sulfate-K Sulfate-Mg Sulf 17.5-3.13-1.6 GM/177ML SOLN Suprep-Use as directed  . [DISCONTINUED] traMADol (ULTRAM) 50 MG tablet Take 1 tablet (50 mg total) by mouth every 6 (six) hours as needed. (Patient not taking: Reported on 02/17/2019)   No facility-administered encounter medications on file as of 02/17/2019.    Allergies  Allergen Reactions  . Bee Venom Anaphylaxis  .  Oysters [Shellfish Allergy] Swelling    Swelling of hands  . Penicillins Other (See Comments)    CAUSES "FREE BLEEDING" Has patient had a PCN reaction causing immediate rash, facial/tongue/throat swelling, SOB or lightheadedness with hypotension: No Has patient had a PCN reaction causing severe rash involving mucus membranes or skin necrosis: No Has patient had a PCN reaction that required hospitalization No Has patient had a PCN reaction occurring within the last 10 years: No If all of  the above answers are "NO", then may proceed with Cephalosporin use.   Patient Active Problem List   Diagnosis Date Noted  . Dyslipidemia (high LDL; low HDL) 01/28/2019  . PAD (peripheral artery disease) ([place]) 01/28/2019  . Great toe pain, left 10/21/2018  . Encounter for Medicare annual wellness exam 06/20/2018  . Laceration of left lower leg 04/17/2018  . Laceration of skin of left lower leg 04/01/2018  . Increased urinary frequency 04/01/2018  . Hematuria 01/30/2018  . Puncture wound of toe of left foot 01/07/2018  . At risk for diabetic foot ulcer 01/07/2018  . Need for pneumococcal vaccination 11/27/2017  . Screening for colon cancer 11/27/2017  . Healthcare maintenance 11/27/2017  . Paroxysmal atrial fibrillation ([place]) 11/22/2016  . History of embolic stroke 99/83/3825  . Diabetes mellitus type 2 in obese ([place]) 11/22/2016  . Venous insufficiency of both lower extremities 09/21/2016  . Stroke ([place]) 08/12/2016  . Hyperglycemia   . Essential hypertension   . Vision, loss, sudden   . CVA (cerebral infarction) 08/11/2016  . CAD - inferior MI 2009, CABG 2009, patent grafts cath 04/2012 09/23/2013  . Moderate obesity 09/23/2013  . Hyperlipidemia 09/23/2013  . Tobacco abuse 09/23/2013  . Prostate cancer ([place]) 08/18/2011  . Myocardial infarction [facility])    Social History   Socioeconomic History  . Marital status: Married    Spouse name: Not on file  . Number of children: Not on file  . Years of education: Not on file  . Highest education level: Not on file  Occupational History  . Not on file  Social Needs  . Financial resource strain: Not on file  . Food insecurity:    Worry: Not on file    Inability: Not on file  . Transportation needs:    Medical: Not on file    Non-medical: Not on file  Tobacco Use  . Smoking status: Current Every Day Smoker    Packs/day: 1.50    Years: 50.00    Pack years: 75.00    Types: Cigarettes  . Smokeless tobacco: Never Used  . Tobacco  comment: PREVIOUSLY SMOKED 3PPD / DECREASED TO 1PPD SINCE 2009  Substance and Sexual Activity  . Alcohol use: Not Currently    Alcohol/week: 2.0 standard drinks    Types: 1 Cans of beer, 1 Shots of liquor per week    Comment: none in 3 years  . Drug use: No  . Sexual activity: Never  Lifestyle  . Physical activity:    Days per week: Not on file    Minutes per session: Not on file  . Stress: Not on file  Relationships  . Social connections:    Talks on phone: Not on file    Gets together: Not on file    Attends religious service: Not on file    Active member of club or organization: Not on file    Attends meetings of clubs or organizations: Not on file    Relationship status: Not on file  . Intimate  partner violence:    Fear of current or ex partner: Not on file    Emotionally abused: Not on file    Physically abused: Not on file    Forced sexual activity: Not on file  Other Topics Concern  . Not on file  Social History Narrative  . Not on file    Mr. Bergeson family history includes Cancer in his father; Diabetes in his father.      Objective:    Vitals:   02/17/19 0835  BP: 122/72  Pulse: 76  SpO2: 98%    Physical Exam; well-developed older white male in no acute distress, pleasant, height 5 foot 8, weight 234, BMI 35.0.  HEENT ;nontraumatic normocephalic EOMI PERRLA sclera anicteric, oral mucosa moist.  Cardiovascular; regular rate and rhythm with S1-S2 no murmur rub or gallop, Pulmonary; clear bilaterally, Abdomen; obese, soft, nontender nondistended bowel sounds are active, no palpable mass or hepatosplenomegaly.  Rectal ;exam not done, Extremities; no clubbing cyanosis or edema skin warm and dry, Neuropsych; alert and oriented, grossly nonfocal mood and affect appropriate       Assessment & Plan:   #26 68 year old white male, referred for colon cancer screening.  Patient is asymptomatic and has not had any prior colon evaluation  2 chronic anticoagulation-on  Eliquis 3.  History of atrial fibrillation 4.  Coronary artery disease status post CABG times 03/2008 5.  Peripheral vascular disease 6.  History of small CVA 2 years ago with visual disturbance 7.  History of prostate cancer status post seed implants 8.  Adult onset diabetes mellitus  Plan; Patient will be scheduled for colonoscopy with Dr. Bryan Lemma.  Procedure was discussed in detail with the patient including indications, risks and benefits and he is agreeable to proceed. Patient will need to hold Eliquis for at least 24 hours prior to procedure.  We will communicate with his cardiologist Dr. Sherrie Mustache  to assure that this is reasonable for this patient.   S  PA-C 02/17/2019   Cc: Esaw Grandchild, NP

## 2019-02-17 NOTE — Telephone Encounter (Signed)
   Primary Cardiologist: Sanda Klein, MD  Chart reviewed as part of pre-operative protocol coverage. Given past medical history and time since last visit, based on ACC/AHA guidelines, [name] would be at acceptable risk for the planned procedure without further cardiovascular testing.  [name] was recently seen in the office by Dr. Sallyanne Kuster and doing well. He has hx of CAD s/p remote CABG with no current anginal symptoms. He has hx of afib with infrequent occurrences and he is asyptomatic.   According to our pharmacy protocol: Pt takes Eliquis for afib with CHADS2VASc score 6 (age, HTN, CAD, DM, stroke). Renal function is normal. Ok to hold Eliquis for only 24 hours prior to procedure, resume as soon as safely possible after due to history of stroke.  I will route this recommendation to the requesting party via Epic fax function and remove from pre-op pool.  Please call with questions.  Daune Perch, NP 02/17/2019, 12:09 PM

## 2019-02-17 NOTE — Telephone Encounter (Signed)
[facility] Pre-operative Risk Assessment     Request for surgical clearance:     Endoscopy Procedure  What type of surgery is being performed?     Colonoscopy  When is this surgery scheduled?     03/11/19  What type of clearance is required ?   Pharmacy  Are there any medications that need to be held prior to surgery and how long? HOLD ELIQUIS 24 hours PRIOR  Practice name and name of physician performing surgery?      [facility]  What is your office phone and fax number?      Phone- [phone]  [phone]  Anesthesia type (None, local, MAC, general) ?       MAC

## 2019-02-18 NOTE — Progress Notes (Signed)
Agree with Ms. Genia Harold assessment and plan.  Natesha Hassey, DO, [facility]

## 2019-02-24 ENCOUNTER — Ambulatory Visit (HOSPITAL_COMMUNITY)
Admission: RE | Admit: 2019-02-24 | Discharge: 2019-02-24 | Disposition: A | Discharge status: Home / Self Care | Payer: PPO | Source: Ambulatory Visit | Attending: Internal Medicine | Admitting: Internal Medicine

## 2019-02-24 DIAGNOSIS — I739 Peripheral vascular disease, unspecified: Secondary | ICD-10-CM | POA: Insufficient documentation

## 2019-02-25 ENCOUNTER — Other Ambulatory Visit: Payer: Self-pay | Admitting: Cardiovascular Disease

## 2019-03-07 ENCOUNTER — Other Ambulatory Visit: Payer: Self-pay | Admitting: Adult Health

## 2019-03-10 ENCOUNTER — Telehealth: Payer: Self-pay | Admitting: Gastroenterology

## 2019-03-10 ENCOUNTER — Other Ambulatory Visit: Payer: Self-pay

## 2019-03-10 ENCOUNTER — Ambulatory Visit (INDEPENDENT_AMBULATORY_CARE_PROVIDER_SITE_OTHER): Payer: PPO | Admitting: *Deleted

## 2019-03-10 DIAGNOSIS — I63411 Cerebral infarction due to embolism of right middle cerebral artery: Secondary | ICD-10-CM | POA: Diagnosis not present

## 2019-03-10 NOTE — Telephone Encounter (Signed)
Appointment rescheduled to [facility] 03-12-2019 at 8am w/Dr Alexandria Bay.

## 2019-03-11 ENCOUNTER — Encounter: Payer: PPO | Admitting: Gastroenterology

## 2019-03-11 ENCOUNTER — Telehealth: Payer: Self-pay | Admitting: *Deleted

## 2019-03-11 DIAGNOSIS — I779 Disorder of arteries and arterioles, unspecified: Secondary | ICD-10-CM

## 2019-03-11 DIAGNOSIS — I872 Venous insufficiency (chronic) (peripheral): Secondary | ICD-10-CM

## 2019-03-11 LAB — CUP PACEART REMOTE DEVICE CHECK
Implantable Pulse Generator Implant Date: 20170821
MDC IDC SESS DTM: 20200316020820

## 2019-03-11 NOTE — Telephone Encounter (Addendum)
Received mychart message below. Referral placed and mychart message sent to patient to call office directly if he does not hear in a few day.     Good Morning,  I was scheduled to see Dr. Gwenlyn Found on 3/18 regarding the results of my vascular study that was done. After thinking about this and speaking with family I would prefer to see Dr. Donnetta Hutching or Dr. Trula Slade at [facility] regarding this issue due to the complexity of it. Please let me know if and when this appointment with [facility] can be arranged. You can call my cell at [phone]

## 2019-03-11 NOTE — Telephone Encounter (Signed)
Fine w me. Please put in a referral MCr

## 2019-03-11 NOTE — Addendum Note (Signed)
Addended by: Alvina Filbert B on: 03/11/2019 10:54 AM   Modules accepted: Orders

## 2019-03-11 NOTE — Telephone Encounter (Signed)
Covid-19 travel screening questions  Have you traveled in the last 14 days? No If yes where?  Do you now or have you had a fever in the last 14 days? No  Do you have any respiratory symptoms of shortness of breath or cough now or in the last 14 days? No  Do you have any family members or close contacts with diagnosed or suspected Covid-19? No       

## 2019-03-12 ENCOUNTER — Other Ambulatory Visit: Payer: Self-pay

## 2019-03-12 ENCOUNTER — Ambulatory Visit (AMBULATORY_SURGERY_CENTER): Payer: PPO | Admitting: Gastroenterology

## 2019-03-12 ENCOUNTER — Encounter: Payer: Self-pay | Admitting: Gastroenterology

## 2019-03-12 ENCOUNTER — Ambulatory Visit: Payer: PPO | Admitting: Cardiovascular Disease

## 2019-03-12 VITALS — BP 119/67 | HR 63 | Temp 96.6°F | Resp 14 | Ht 68.5 in | Wt 234.0 lb

## 2019-03-12 DIAGNOSIS — D123 Benign neoplasm of transverse colon: Secondary | ICD-10-CM | POA: Diagnosis not present

## 2019-03-12 DIAGNOSIS — K640 First degree hemorrhoids: Secondary | ICD-10-CM

## 2019-03-12 DIAGNOSIS — D125 Benign neoplasm of sigmoid colon: Secondary | ICD-10-CM

## 2019-03-12 DIAGNOSIS — K635 Polyp of colon: Secondary | ICD-10-CM

## 2019-03-12 DIAGNOSIS — I739 Peripheral vascular disease, unspecified: Secondary | ICD-10-CM | POA: Diagnosis not present

## 2019-03-12 DIAGNOSIS — I2510 Atherosclerotic heart disease of native coronary artery without angina pectoris: Secondary | ICD-10-CM | POA: Diagnosis not present

## 2019-03-12 DIAGNOSIS — D129 Benign neoplasm of anus and anal canal: Secondary | ICD-10-CM

## 2019-03-12 DIAGNOSIS — D128 Benign neoplasm of rectum: Secondary | ICD-10-CM

## 2019-03-12 DIAGNOSIS — Z1211 Encounter for screening for malignant neoplasm of colon: Secondary | ICD-10-CM

## 2019-03-12 DIAGNOSIS — K5520 Angiodysplasia of colon without hemorrhage: Secondary | ICD-10-CM

## 2019-03-12 DIAGNOSIS — I4891 Unspecified atrial fibrillation: Secondary | ICD-10-CM | POA: Diagnosis not present

## 2019-03-12 DIAGNOSIS — E119 Type 2 diabetes mellitus without complications: Secondary | ICD-10-CM | POA: Diagnosis not present

## 2019-03-12 DIAGNOSIS — K5730 Diverticulosis of large intestine without perforation or abscess without bleeding: Secondary | ICD-10-CM

## 2019-03-12 DIAGNOSIS — I10 Essential (primary) hypertension: Secondary | ICD-10-CM | POA: Diagnosis not present

## 2019-03-12 MED ORDER — SODIUM CHLORIDE 0.9 % IV SOLN: 500.0000 mL | Freq: Once | INTRAVENOUS | Status: DC

## 2019-03-12 NOTE — Progress Notes (Signed)
Report to PACU, RN, vss, BBS= Clear.  

## 2019-03-12 NOTE — Op Note (Signed)
[place] Patient Name: [name] Procedure Date: 03/12/2019 7:40 AM MRN: [id number] Endoscopist: Gerrit Heck , MD Age: 68 Referring MD:  Date of Birth: 01-25-1951 Gender: Male Account #: [id number] Procedure:                Colonoscopy Indications:              Screening for colorectal malignant neoplasm Medicines:                Monitored Anesthesia Care Procedure:                Pre-Anesthesia Assessment:                           - Prior to the procedure, a History and Physical                            was performed, and patient medications and                            allergies were reviewed. The patient's tolerance of                            previous anesthesia was also reviewed. The risks                            and benefits of the procedure and the sedation                            options and risks were discussed with the patient.                            All questions were answered, and informed consent                            was obtained. Prior Anticoagulants: The patient has                            taken Eliquis (apixaban), last dose was 2 days                            prior to procedure. ASA Grade Assessment: III - A                            patient with severe systemic disease. After                            reviewing the risks and benefits, the patient was                            deemed in satisfactory condition to undergo the                            procedure.  After obtaining informed consent, the colonoscope                            was passed under direct vision. Throughout the                            procedure, the patient's blood pressure, pulse, and                            oxygen saturations were monitored continuously. The                            Colonoscope was introduced through the anus and                            advanced to the the terminal ileum. The colonoscopy                           was performed without difficulty. The patient                            tolerated the procedure well. The quality of the                            bowel preparation was adequate. The terminal ileum,                            ileocecal valve, appendiceal orifice, and rectum                            were photographed. Scope In: 8:09:13 AM Scope Out: 9:48:54 AM Scope Withdrawal Time: 0 hours 22 minutes 40 seconds  Total Procedure Duration: 0 hours 27 minutes 1 second  Findings:                 The perianal and digital rectal examinations were                            normal.                           Two sessile polyps were found in the sigmoid colon                            and hepatic flexure. The polyps were 5 to 6 mm in                            size. These polyps were removed with a cold snare.                            Resection and retrieval were complete. Estimated                            blood loss was minimal.  Four sessile polyps were found in the rectum. The                            polyps were 2 to 4 mm in size. These polyps were                            removed with a cold biopsy forceps for the smaller                            3 and cold forceps for the 4 mm polyp. Resection                            and retrieval were complete. Estimated blood loss                            was minimal.                           A localized area of mildly erythematous mucosa was                            found in the rectum. Biopsies were taken with a                            cold forceps for histology. Estimated blood loss                            was minimal.                           Multiple small and large-mouthed diverticula were                            found in the sigmoid colon.                           Non-bleeding internal hemorrhoids were found during                            retroflexion. The  hemorrhoids were small.                           The terminal ileum appeared normal.                           A single small angioectasia without bleeding was                            found in the rectum. Complications:            No immediate complications. Estimated Blood Loss:     Estimated blood loss was minimal. Impression:               - Two 5 to 6 mm polyps in the sigmoid colon and at  the hepatic flexure, removed with a cold snare.                            Resected and retrieved.                           - Four 2 to 4 mm polyps in the rectum, removed with                            a cold biopsy forceps. Resected and retrieved.                           - Erythematous mucosa in the rectum. Biopsied.                           - Diverticulosis in the sigmoid colon.                           - Non-bleeding internal hemorrhoids.                           - The examined portion of the ileum was normal.                           - A single non-bleeding colonic angioectasia. Recommendation:           - Patient has a contact number available for                            emergencies. The signs and symptoms of potential                            delayed complications were discussed with the                            patient. Return to normal activities tomorrow.                            Written discharge instructions were provided to the                            patient.                           - Resume previous diet today.                           - Resume Eliquis (apixaban) at prior dose tomorrow.                           - Await pathology results.                           - Repeat colonoscopy for surveillance based on  pathology results.                           - Return to GI clinic PRN.                           - Internal hemorrhoids were noted on this study and                            may be amenable to  hemorrhoid band ligation. If you                            are interested in further treatment of these                            hemorrhoids with band ligation, please contact my                            clinic to set up an appointment for evaluation and                            treatment. Gerrit Heck, MD 03/12/2019 8:47:51 AM

## 2019-03-12 NOTE — Addendum Note (Signed)
Addended by: Kathyrn Lass on: 03/12/2019 05:44 PM   Modules accepted: Orders

## 2019-03-12 NOTE — Patient Instructions (Signed)
Please read handouts provided. Resume Eliquis at prior dose tomorrow. Await pathology results.         YOU HAD AN ENDOSCOPIC PROCEDURE TODAY AT [facility]:   Refer to the procedure report that was given to you for any specific questions about what was found during the examination.  If the procedure report does not answer your questions, please call your gastroenterologist to clarify.  If you requested that your care partner not be given the details of your procedure findings, then the procedure report has been included in a sealed envelope for you to review at your convenience later.  YOU SHOULD EXPECT: Some feelings of bloating in the abdomen. Passage of more gas than usual.  Walking can help get rid of the air that was put into your GI tract during the procedure and reduce the bloating. If you had a lower endoscopy (such as a colonoscopy or flexible sigmoidoscopy) you may notice spotting of blood in your stool or on the toilet paper. If you underwent a bowel prep for your procedure, you may not have a normal bowel movement for a few days.  Please Note:  You might notice some irritation and congestion in your nose or some drainage.  This is from the oxygen used during your procedure.  There is no need for concern and it should clear up in a day or so.  SYMPTOMS TO REPORT IMMEDIATELY:   Following lower endoscopy (colonoscopy or flexible sigmoidoscopy):  Excessive amounts of blood in the stool  Significant tenderness or worsening of abdominal pains  Swelling of the abdomen that is new, acute  Fever of 100F or higher   For urgent or emergent issues, a gastroenterologist can be reached at any hour by calling [phone].   DIET:  We do recommend a small meal at first, but then you may proceed to your regular diet.  Drink plenty of fluids but you should avoid alcoholic beverages for 24 hours.  ACTIVITY:  You should plan to take it easy for the rest of today and you  should NOT DRIVE or use heavy machinery until tomorrow (because of the sedation medicines used during the test).    FOLLOW UP: Our staff will call the number listed on your records the next business day following your procedure to check on you and address any questions or concerns that you may have regarding the information given to you following your procedure. If we do not reach you, we will leave a message.  However, if you are feeling well and you are not experiencing any problems, there is no need to return our call.  We will assume that you have returned to your regular daily activities without incident.  If any biopsies were taken you will be contacted by phone or by letter within the next 1-3 weeks.  Please call us at [phone] if you have not heard about the biopsies in 3 weeks.    SIGNATURES/CONFIDENTIALITY: You and/or your care partner have signed paperwork which will be entered into your electronic medical record.  These signatures attest to the fact that that the information above on your After Visit Summary has been reviewed and is understood.  Full responsibility of the confidentiality of this discharge information lies with you and/or your care-partner.

## 2019-03-12 NOTE — Telephone Encounter (Signed)
Spoke to patient Dr.Croitoru advised ok to see [facility].Order placed.Advised someone from [facility] will call and schedule appointment.

## 2019-03-12 NOTE — Progress Notes (Signed)
Called to room to assist during endoscopic procedure.  Patient ID and intended procedure confirmed with present staff. Received instructions for my participation in the procedure from the performing physician.  

## 2019-03-13 ENCOUNTER — Telehealth: Payer: Self-pay | Admitting: *Deleted

## 2019-03-13 NOTE — Telephone Encounter (Signed)
  Follow up Call-  Call back number 03/12/2019  Post procedure Call Back phone  # [phone]  Permission to leave phone message Yes  Some recent data might be hidden     Patient questions:  Do you have a fever, pain , or abdominal swelling? No. Pain Score  0 *  Have you tolerated food without any problems? Yes.    Have you been able to return to your normal activities? Yes.    Do you have any questions about your discharge instructions: Diet   No. Medications  No. Follow up visit  No.  Do you have questions or concerns about your Care? No.  Actions: * If pain score is 4 or above: No action needed, pain <4.

## 2019-03-18 NOTE — Progress Notes (Signed)
Carelink Summary Report / Loop Recorder 

## 2019-03-19 ENCOUNTER — Encounter: Payer: Self-pay | Admitting: Gastroenterology

## 2019-03-22 ENCOUNTER — Encounter: Payer: Self-pay | Admitting: Cardiovascular Disease

## 2019-03-22 NOTE — Progress Notes (Unsigned)
LINQ alert for tachy episodes. Tachy episodes are AF with RVR and occurred during 44 minute AF episode on 3/27. Lane - Eliquis. Routed to Dr C for review of rate control.

## 2019-03-24 NOTE — Progress Notes (Signed)
Since the overall burden of AFib is so low (and has always been asymptomatic), no changes for now. MCr

## 2019-03-25 ENCOUNTER — Encounter: Payer: PPO | Admitting: Gastroenterology

## 2019-04-04 ENCOUNTER — Other Ambulatory Visit: Payer: Self-pay | Admitting: Cardiovascular Disease

## 2019-04-11 ENCOUNTER — Other Ambulatory Visit: Payer: Self-pay

## 2019-04-11 ENCOUNTER — Ambulatory Visit (INDEPENDENT_AMBULATORY_CARE_PROVIDER_SITE_OTHER): Payer: PPO | Admitting: *Deleted

## 2019-04-11 DIAGNOSIS — I63411 Cerebral infarction due to embolism of right middle cerebral artery: Secondary | ICD-10-CM

## 2019-04-11 DIAGNOSIS — I480 Paroxysmal atrial fibrillation: Secondary | ICD-10-CM

## 2019-04-12 LAB — CUP PACEART REMOTE DEVICE CHECK
Date Time Interrogation Session: 20200418024043
Implantable Pulse Generator Implant Date: 20170821

## 2019-04-14 ENCOUNTER — Ambulatory Visit: Payer: PPO | Admitting: Podiatry

## 2019-04-14 ENCOUNTER — Telehealth (HOSPITAL_COMMUNITY): Payer: Self-pay | Admitting: Rehabilitation

## 2019-04-14 NOTE — Telephone Encounter (Signed)
The above patient or their representative was contacted and gave the following answers to these questions:         Do you have any of the following symptoms? No  Fever                    Cough                   Shortness of breath  Do  you have any of the following other symptoms? No   muscle pain         vomiting,        diarrhea        rash         weakness        red eye        abdominal pain         bruising          bruising or bleeding              joint pain           severe headache    Have you been in contact with someone who was or has been sick in the past 2 weeks? No  Yes                 Unsure                         Unable to assess   Does the person that you were in contact with have any of the following symptoms?   Cough         shortness of breath           muscle pain         vomiting,            diarrhea            rash            weakness           fever            red eye           abdominal pain           bruising  or  bleeding                joint pain                severe headache               Have you  or someone you have been in contact with traveled internationally in th last month? No        If yes, which countries?   Have you  or someone you have been in contact with traveled outside [place] in th last month? No         If yes, which state and city?   COMMENTS OR ACTION PLAN FOR THIS PATIENT:          

## 2019-04-15 ENCOUNTER — Other Ambulatory Visit: Payer: Self-pay

## 2019-04-15 ENCOUNTER — Ambulatory Visit (INDEPENDENT_AMBULATORY_CARE_PROVIDER_SITE_OTHER): Payer: PPO | Admitting: Vascular Surgery

## 2019-04-15 ENCOUNTER — Encounter: Payer: Self-pay | Admitting: Vascular Surgery

## 2019-04-15 VITALS — BP 131/87 | HR 74 | Temp 97.0°F | Resp 20 | Ht 68.5 in | Wt 230.0 lb

## 2019-04-15 DIAGNOSIS — I70212 Atherosclerosis of native arteries of extremities with intermittent claudication, left leg: Secondary | ICD-10-CM

## 2019-04-15 NOTE — Progress Notes (Signed)
[facility] [place]  Patient name: [name] MRN: [id number] DOB: January 06, 1951 Sex: male  REASON FOR CONSULT: Evaluation lower extremity arterial insufficiency with left leg intermittent claudication  HPI: [name] is a 68 y.o. male, who is here today for evaluation.  He reports intermittent claudication of his left calf.  This is mildly limiting to him.  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  He reports that he notices mainly when he is walking to the mailbox.  He is able to do his routine activities without stopping.  He does not have any arterial rest pain.  He reports that both feet seem cold at night and that he puts on socks and this is immediately relieved.  He does have a long history of chronic venous stasis disease and did have a venous ulcer in the left pretibial area years ago which eventually healed.  He is very compliant in wearing knee-high graduated compression garments.  Also does have a history of a very small 3 cm aneurysm.  Past Medical History:  Diagnosis Date  . Abdominal aortic aneurysm (AAA) ([place])    3 cm per 07-02-18 US abdominal aorta US epic  . Arthritis   . Chronic kidney disease    renal calculi  . Chronic venous insufficiency LOWER EXTREMITIES  . Coronary artery disease CARDIOLOGIST - DR  OEUMPNTI-  LAST 1 WK AGO -- WILL REQUEST NOTE AND STRESS TEST  . Diabetes mellitus without complication ([place])    type 2  . Hematuria    last year  . Hyperglycemia   . Hyperlipidemia   . Hypertension   . Mixed dyslipidemia   . Myocardial infarction ([place])   . Neuropathy    toes only  . Nocturia   . Prostate cancer ([place]) 08/18/11   gleason 8, volume 24.4cc  . S/P CABG x 4   . ST elevation MI (STEMI) ([place]) 02-10-2008   S/P CABG  . Stroke [facility]) 2016   occ trouble wriing with right hand  . Urinary hesitancy   . Vision abnormalities    resolved now  . Wound discharge    since  jan 2019 goes to wound center [place] left great toe small open area changes dressing q 2 days with ointment provided by wound center, clear drainage occ    Family History  Problem Relation Age of Onset  . Cancer Father        pancreatic  . Diabetes Father     SOCIAL HISTORY: Social History   Socioeconomic History  . Marital status: Married    Spouse name: Not on file  . Number of children: Not on file  . Years of education: Not on file  . Highest education level: Not on file  Occupational History  . Not on file  Social Needs  . Financial resource strain: Not on file  . Food insecurity:    Worry: Not on file    Inability: Not on file  . Transportation needs:    Medical: Not on file    Non-medical: Not on file  Tobacco Use  . Smoking status: Current Every Day Smoker    Packs/day: 1.00    Years: 50.00    Pack years: 50.00    Types: Cigarettes  . Smokeless tobacco: Never Used  . Tobacco comment: PREVIOUSLY SMOKED 3PPD / DECREASED TO 1PPD SINCE 2009  Substance and Sexual Activity  . Alcohol use: Not Currently    Alcohol/week:  2.0 standard drinks    Types: 1 Cans of beer, 1 Shots of liquor per week    Comment: none in 3 years  . Drug use: No  . Sexual activity: Never  Lifestyle  . Physical activity:    Days per week: Not on file    Minutes per session: Not on file  . Stress: Not on file  Relationships  . Social connections:    Talks on phone: Not on file    Gets together: Not on file    Attends religious service: Not on file    Active member of club or organization: Not on file    Attends meetings of clubs or organizations: Not on file    Relationship status: Not on file  . Intimate partner violence:    Fear of current or ex partner: Not on file    Emotionally abused: Not on file    Physically abused: Not on file    Forced sexual activity: Not on file  Other Topics Concern  . Not on file  Social History Narrative  . Not on file    Allergies  Allergen  Reactions  . Bee Venom Anaphylaxis  . Oysters [Shellfish Allergy] Swelling    Swelling of hands  . Penicillins Other (See Comments)    CAUSES "FREE BLEEDING" Has patient had a PCN reaction causing immediate rash, facial/tongue/throat swelling, SOB or lightheadedness with hypotension: No Has patient had a PCN reaction causing severe rash involving mucus membranes or skin necrosis: No Has patient had a PCN reaction that required hospitalization No Has patient had a PCN reaction occurring within the last 10 years: No If all of the above answers are "NO", then may proceed with Cephalosporin use.    Current Outpatient Medications  Medication Sig Dispense Refill  . apixaban (ELIQUIS) 5 MG TABS tablet Take 1 tablet by mouth twice daily.  Please schedule MD appt for further refills 180 tablet 3  . atorvastatin (LIPITOR) 20 MG tablet Take 1 tablet (20 mg total) by mouth daily. 90 tablet 1  . buPROPion (WELLBUTRIN XL) 150 MG 24 hr tablet Take 1 tablet by mouth twice daily 180 tablet 0  . Continuous Blood Gluc Receiver (FREESTYLE LIBRE 14 DAY READER) DEVI 1 Device by Does not apply route daily. Use to check blood sugars every morning and 2 hours after largest meals 1 Device 0  . Continuous Blood Gluc Sensor (FREESTYLE LIBRE 14 DAY SENSOR) MISC 1 each by Does not apply route daily. Use to check blood sugars every morning fasting and 2 hours after largest meal 12 each 2  . finasteride (PROSCAR) 5 MG tablet Take 5 mg by mouth daily.  11  . gabapentin (NEURONTIN) 300 MG capsule TAKE 1 CAPSULE BY MOUTH IN THE MORNING AND 2 AT DINNER TIME 270 capsule 0  . glucose blood (FREESTYLE LITE) test strip Use to check blood sugars every morning fasting 100 each 12  . lisinopril (PRINIVIL,ZESTRIL) 5 MG tablet TAKE 1/2 (ONE-HALF) TABLET BY MOUTH ONCE DAILY 45 tablet 0  . metFORMIN (GLUCOPHAGE-XR) 500 MG 24 hr tablet Take 4 tablets daily with breakfast 120 tablet 3  . mirabegron ER (MYRBETRIQ) 25 MG TB24 tablet Take 25  mg by mouth daily.    . Multiple Vitamin (MULTIVITAMIN WITH MINERALS) TABS tablet Take 1 tablet by mouth daily. Men's One-A-Day Multivitamin    . nicotine (NICODERM CQ - DOSED IN MG/24 HOURS) 21 mg/24hr patch Place 21 mg onto the skin daily.    Marland Kitchen  oxybutynin (DITROPAN) 5 MG tablet Take 1 tablet (5 mg total) by mouth every 8 (eight) hours as needed for bladder spasms. 30 tablet 1  . sildenafil (VIAGRA) 100 MG tablet Take 100 mg by mouth daily as needed for erectile dysfunction.    . silodosin (RAPAFLO) 4 MG CAPS capsule Take 1 capsule by mouth once daily 90 each 0  . tamsulosin (FLOMAX) 0.4 MG CAPS capsule Take 0.4 mg by mouth daily after supper.     No current facility-administered medications for this visit.     REVIEW OF SYSTEMS:  [X]  denotes positive finding, [ ]  denotes negative finding Cardiac  Comments:  Chest pain or chest pressure:    Shortness of breath upon exertion:    Short of breath when lying flat:    Irregular heart rhythm: x       Vascular    Pain in calf, thigh, or hip brought on by ambulation: x   Pain in feet at night that wakes you up from your sleep:  x   Blood clot in your veins:    Leg swelling:         Pulmonary    Oxygen at home:    Productive cough:     Wheezing:         Neurologic    Sudden weakness in arms or legs:     Sudden numbness in arms or legs:     Sudden onset of difficulty speaking or slurred speech:    Temporary loss of vision in one eye:     Problems with dizziness:         Gastrointestinal    Blood in stool:     Vomited blood:         Genitourinary    Burning when urinating:  x   Blood in urine:        Psychiatric    Major depression:         Hematologic    Bleeding problems:    Problems with blood clotting too easily:        Skin    Rashes or ulcers:        Constitutional    Fever or chills:      PHYSICAL EXAM: Vitals:   04/15/19 1009  BP: 131/87  Pulse: 74  Resp: 20  Temp: (!) 97 F (36.1 C)  SpO2: 95%   Weight: 230 lb (104.3 kg)  Height: 5' 8.5" (1.74 m)    GENERAL: The patient is a well-nourished male, in no acute distress. The vital signs are documented above. CARDIOVASCULAR: Carotid arteries are without bruits bilaterally.  2+ radial and 2+ femoral pulses bilaterally.  2+ right popliteal and 2+ right dorsalis pedis pulse.  Absent left popliteal and pedal pulses. PULMONARY: There is good air exchange  ABDOMEN: Soft and non-tender  MUSCULOSKELETAL: There are no major deformities or cyanosis. NEUROLOGIC: No focal weakness or paresthesias are detected. SKIN: There are no ulcers or rashes noted.  Does have changes of chronic venous stasis disease with circumferential hemosiderin deposits bilaterally PSYCHIATRIC: The patient has a normal affect.  DATA:  Noninvasive studies from 02/24/2019 were reviewed.  This reveals normal ankle arm index with triphasic waveforms in the right leg ankle arm index is 0.77 with biphasic flow in the left common femoral and monophasic flow in the tibials.  Duplex imaging reveals superficial femoral and popliteal and tibial occlusive disease on the left  MEDICAL ISSUES: Had a long discussion with patient regarding this.  I explained that he is in no risk for limb threatening ischemia.  He is mildly limited by his claudication.  I explained that the natural history of this is slow progression and would not recommend any treatment at this time.  Explained that he had actually been at higher risk if he had intervention over time then simply monitoring his claudication.  He is a cigarette smoker and I explained the direct relationship between this and his peripheral vascular occlusive disease.  He understands this and is trying to quit.  He will see [place] again on an as-needed basis.  I explained that if he does develop any worsening claudication or any tissue loss he needs to notify us immediately.   Rosetta Posner, MD FACS [facility]  Tel [phone] Pager [phone]

## 2019-04-15 NOTE — Progress Notes (Signed)
Thanks! [name] 

## 2019-04-16 NOTE — Progress Notes (Addendum)
Virtual Visit via Telephone Note  I connected with [name] on 04/21/19 at  8:45 AM EDT by telephone and verified that I am speaking with the correct person using two identifiers.   I discussed the limitations, risks, security and privacy concerns of performing an evaluation and management service by telephone and the availability of in person appointments. The staff discussed with the patient that there may be a patient responsible charge related to this service. The patient expressed understanding and agreed to proceed.  Location of Patient- Home Location of Provider- In Clinic   History of Present Illness: 01/21/2019 OV:  [name] is her CPE He reports medication compliance, denies SE He reports continues pain in L great toe, described as occasional "stab" He reports AM BS 80-120s, denies episodes oh hypoglycemia He has upcoming cardiology appt next week  Reviewed recent labs- TSH-WNL, 2.350 Lipid Panel- reduced Atorvastatin 40mg  reduce to 20mg  QD Tot chol 83 HDL 34 LDL 29 A1c-6.9, bump from 6.3 two months ago CMP-stable CBC-chronically elevated WBC/lymphocytes- may be r/t to chronic toe infections He has never been seen by Onc/Hemat for work-up Advised to f/u with Podiatrist , re: L great toe pain and callus removal Will repeat CBC after procedure  04/21/2019 OV: [name] is here for 3 month f/u: HTN, T2D,  AM BG upper 90-120s He denies episodes of hypoglycemia  He has been limiting CHO/sugar intake He remains quite active- 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 Encouraged to continue to reduce- stop tobacco use He reports that his wife removed a tick from "back of my R  knee" sat morning.  He estimates that the tick was attached 4-5 hours.  Tick was flat. He denies fever/night sweats/body aches/joint aches/bull eyes rash He reports that his R knee was swollen sat and Sunday, however is "completely normal this morning"- Monday He denies pain or warmth when touched He denies malaise or poor appetite  Of Note- 04/15/2019- He was evaluated by Vein Specialists- for intermittent claudication of his left calf.  Plan is to monitor,encouraged tobacco cessation, f/u PRN  Patient Care Team    Relationship Specialty Notifications Start End  Esaw Grandchild, NP PCP - General Family Medicine  11/27/17   Sanda Klein, MD PCP - Cardiology Cardiology Admissions 01/16/18     Patient Active Problem List   Diagnosis Date Noted  . Dyslipidemia (high LDL; low HDL) 01/28/2019  . PAD (peripheral artery disease) ([place]) 01/28/2019  . Great toe pain, left 10/21/2018  . Encounter for Medicare annual wellness exam 06/20/2018  . Laceration of left lower leg 04/17/2018  . Laceration of skin of left lower leg 04/01/2018  . Increased urinary frequency 04/01/2018  . Hematuria 01/30/2018  . Puncture wound of toe of left foot 01/07/2018  . At risk for diabetic foot ulcer 01/07/2018  . Need for pneumococcal vaccination 11/27/2017  . Screening for colon cancer 11/27/2017  . Healthcare maintenance 11/27/2017  . Paroxysmal atrial fibrillation ([place]) 11/22/2016  . History of embolic stroke 62/22/9798  . Diabetes mellitus type 2 in obese ([place]) 11/22/2016  . Venous insufficiency of both lower extremities 09/21/2016  . Stroke ([place]) 08/12/2016  . Hyperglycemia   . Essential hypertension   .  Vision, loss, sudden   . CVA (cerebral infarction) 08/11/2016  . CAD - inferior MI 2009, CABG 2009, patent grafts cath 04/2012 09/23/2013  . Moderate obesity 09/23/2013  . Hyperlipidemia 09/23/2013  . Tobacco abuse 09/23/2013  . Prostate cancer ([place]) 08/18/2011  . Myocardial infarction [facility])       Past Medical History:  Diagnosis Date  . Abdominal aortic aneurysm (AAA) ([place])    3 cm per 07-02-18 US abdominal aorta US epic  . Arthritis   . Blood clot in vein    behind left knee  . Chronic kidney disease    renal calculi  . Chronic venous insufficiency LOWER EXTREMITIES  . Coronary artery disease CARDIOLOGIST - DR  [id number]-  LAST 1 WK AGO -- WILL REQUEST NOTE AND STRESS TEST  . Diabetes mellitus without complication ([place])    type 2  . Hematuria    last year  . Hyperglycemia   . Hyperlipidemia   . Hypertension   . Mixed dyslipidemia   . Myocardial infarction ([place])   . Neuropathy    toes only  . Nocturia   . Prostate cancer ([place]) 08/18/11   gleason 8, volume 24.4cc  . S/P CABG x 4   . ST elevation MI (STEMI) ([place]) 02-10-2008   S/P CABG  . Stroke [facility]) 2016   occ trouble wriing with right hand  . Urinary hesitancy   . Vision abnormalities    resolved now  . Wound discharge    since jan 2019 goes to wound center [place] left great toe small open area changes dressing q 2 days with ointment provided by wound center, clear drainage occ     Past Surgical History:  Procedure Laterality Date  . CARDIAC CATHETERIZATION  05/01/2012   grafts widely patent  . CARDIOVASCULAR STRESS TEST  03-15-2010   INFERIOR WALL SCAR WITHOUT ANY MEANINGFUL ISCHEMIA/ EF 46% / LOW RISK SCAN  . CORONARY ARTERY BYPASS GRAFT  02-10-2008  DR Einar Gip   X4 VESSEL DISEASE / [facility] CABG  . CYSTOSCOPY  02/08/2012   Procedure: CYSTOSCOPY FLEXIBLE;  Surgeon: Franchot Gallo, MD;  Location: [facility];  Service: Urology;;  . [place], URETEROSCOPY AND STENT PLACEMENT Right 09/12/2018   Procedure: CYSTOSCOPY WITH RETROGRADE PYELOGRAM, URETEROSCOPY AND STENT PLACEMENT, STONE EXTRACTION;  Surgeon: Franchot Gallo, MD;  Location: [facility];  Service: Urology;  Laterality: Right;  . EP IMPLANTABLE DEVICE N/A 08/14/2016   Procedure: Loop Recorder Insertion;   Surgeon: Evans Lance, MD;  Location: [place] CV LAB;  Service: Cardiovascular;  Laterality: N/A;  . EXTRACORPOREAL SHOCK WAVE LITHOTRIPSY  2013  . HOLMIUM LASER APPLICATION Right 3/66/4403   Procedure: HOLMIUM LASER APPLICATION;  Surgeon: Franchot Gallo, MD;  Location: [facility];  Service: Urology;  Laterality: Right;  . KNEE SURGERY  age 89   RIGHT  . LEFT HEART CATHETERIZATION WITH CORONARY/GRAFT ANGIOGRAM N/A 05/01/2012   Procedure: LEFT HEART CATHETERIZATION WITH Beatrix Fetters;  Surgeon: Sanda Klein, MD;  Location: [place] CATH LAB;  Service: Cardiovascular;  Laterality: N/A;  . MULTIPLE TEETH EXTRACTIONS (23)/ FOUR QUADRANT ALVEOLPLASTY/ MANDIBULAR LATERAL EXOSTOSES REDUCTIONS  03-24-2010   CHRONIC PERIODONTITIS  . RADIOACTIVE SEED IMPLANT  02/08/2012   Procedure: RADIOACTIVE SEED IMPLANT;  Surgeon: Franchot Gallo, MD;  Location: [facility];  Service: Urology;  Laterality: N/A;  C-ARM   . SHOULDER SURGERY  1982   LEFT  . TEE WITHOUT CARDIOVERSION N/A 08/14/2016   Procedure: TRANSESOPHAGEAL ECHOCARDIOGRAM (TEE);  Surgeon: Collier Salina  Tommy Rainwater, MD;  Location: [place] [facility];  Service: Cardiovascular;  Laterality: N/A;  . URETERAL STENT PLACEMENT       Family History  Problem Relation Age of Onset  . Cancer Father        pancreatic  . Diabetes Father      Social History   Substance and Sexual Activity  Drug Use No     Social History   Substance and Sexual Activity  Alcohol Use Not Currently  . Alcohol/week: 2.0 standard drinks  . Types: 1 Cans of beer, 1 Shots of liquor per week   Comment: none in 3 years     Social History   Tobacco Use  Smoking Status Current Every Day Smoker  . Packs/day: 1.00  . Years: 50.00  . Pack years: 50.00  . Types: Cigarettes  Smokeless Tobacco Never Used  Tobacco Comment   PREVIOUSLY SMOKED 3PPD / DECREASED TO 1PPD SINCE 2009     Outpatient Encounter Medications as of 04/21/2019  Medication Sig Note  .  apixaban (ELIQUIS) 5 MG TABS tablet Take 1 tablet by mouth twice daily.  Please schedule MD appt for further refills   . atorvastatin (LIPITOR) 20 MG tablet Take 1 tablet (20 mg total) by mouth daily.   Marland Kitchen buPROPion (WELLBUTRIN XL) 150 MG 24 hr tablet Take 1 tablet by mouth twice daily   . Continuous Blood Gluc Receiver (FREESTYLE LIBRE 14 DAY READER) DEVI 1 Device by Does not apply route daily. Use to check blood sugars every morning and 2 hours after largest meals   . Continuous Blood Gluc Sensor (FREESTYLE LIBRE 14 DAY SENSOR) MISC 1 each by Does not apply route daily. Use to check blood sugars every morning fasting and 2 hours after largest meal   . finasteride (PROSCAR) 5 MG tablet Take 5 mg by mouth daily.   Marland Kitchen gabapentin (NEURONTIN) 300 MG capsule TAKE 1 CAPSULE BY MOUTH IN THE MORNING AND 2 AT [facility] TIME   . glucose blood (FREESTYLE LITE) test strip Use to check blood sugars every morning fasting   . lisinopril (PRINIVIL,ZESTRIL) 5 MG tablet TAKE 1/2 (ONE-HALF) TABLET BY MOUTH ONCE DAILY   . metFORMIN (GLUCOPHAGE-XR) 500 MG 24 hr tablet Take 4 tablets daily with breakfast (Patient taking differently: Take 1,000 mg by mouth 2 (two) times daily. Take 4 tablets daily with breakfast)   . Multiple Vitamin (MULTIVITAMIN WITH MINERALS) TABS tablet Take 1 tablet by mouth daily. Men's One-A-Day Multivitamin   . oxybutynin (DITROPAN) 5 MG tablet Take 1 tablet (5 mg total) by mouth every 8 (eight) hours as needed for bladder spasms.   . sildenafil (VIAGRA) 100 MG tablet Take 100 mg by mouth daily as needed for erectile dysfunction. 09/02/2018: On hold due to upcoming procedure.  . silodosin (RAPAFLO) 4 MG CAPS capsule Take 1 capsule by mouth once daily   . tamsulosin (FLOMAX) 0.4 MG CAPS capsule Take 0.4 mg by mouth daily after supper.   . [DISCONTINUED] mirabegron ER (MYRBETRIQ) 25 MG TB24 tablet Take 25 mg by mouth daily.   . [DISCONTINUED] nicotine (NICODERM CQ - DOSED IN MG/24 HOURS) 21 mg/24hr patch  Place 21 mg onto the skin daily. 09/12/2018: Placed last night right arm   No facility-administered encounter medications on file as of 04/21/2019.     Allergies: Bee venom; Oysters [shellfish allergy]; and Penicillins  Body mass index is 33.96 kg/m.  Blood pressure 130/78, height 5' 8.25" (1.734 m), weight 225 lb (102.1 kg).   Review  of Systems: General:   No F/C, wt loss Pulm:   No DIB, SOB, pleuritic chest pain Card:  No CP, palpitations Abd:  No n/v/d or pain Ext:  No inc edema from baseline This patient does not have sx concerning for COVID-19 Infection (ie; fever, chills, cough, new or worsening shortness of breath).  Observations/Objective: No acute distress during telephone conversation  Assessment and Plan: Continue all medications as directed Remain well hydrated and continue to follow diabetic diet Continue to wear compression socks several days per week Elevate legs when able Discussed Lyme Diease guidelines- advised to call clinic if he notices: Bulls Eye rash, diffuse joint pain, body aches, fever,  Anorexia COVID-19 Education: Signs and symptoms of COVID-19 infection were discussed with pt and how to seek care for testing.  The importance of following the Stay at Home order, and when out- Social Distancing and wearing a facial mask were discussed today.  Follow Up Instructions: 6 weeks- lab appt for A1c check OV in 3 months   I discussed the assessment and treatment plan with the patient. The patient was provided an opportunity to ask questions and all were answered. The patient agreed with the plan and demonstrated an understanding of the instructions.   The patient was advised to call back or seek an in-person evaluation if the symptoms worsen or if the condition fails to improve as anticipated.  I provided 22 minutes of non-face-to-face time during this encounter.   Esaw Grandchild, NP

## 2019-04-16 NOTE — Progress Notes (Signed)
Carelink Summary Report / Loop Recorder 

## 2019-04-20 ENCOUNTER — Other Ambulatory Visit: Payer: Self-pay | Admitting: Adult Health

## 2019-04-21 ENCOUNTER — Ambulatory Visit (INDEPENDENT_AMBULATORY_CARE_PROVIDER_SITE_OTHER): Payer: PPO | Admitting: Adult Health

## 2019-04-21 ENCOUNTER — Other Ambulatory Visit: Payer: Self-pay

## 2019-04-21 ENCOUNTER — Encounter: Payer: Self-pay | Admitting: Adult Health

## 2019-04-21 VITALS — BP 130/78 | Ht 68.25 in | Wt 225.0 lb

## 2019-04-21 DIAGNOSIS — W57XXXA Bitten or stung by nonvenomous insect and other nonvenomous arthropods, initial encounter: Secondary | ICD-10-CM | POA: Insufficient documentation

## 2019-04-21 DIAGNOSIS — Z0000 Encounter for general adult medical examination without abnormal findings: Secondary | ICD-10-CM

## 2019-04-21 DIAGNOSIS — I10 Essential (primary) hypertension: Secondary | ICD-10-CM | POA: Diagnosis not present

## 2019-04-21 DIAGNOSIS — E1169 Type 2 diabetes mellitus with other specified complication: Secondary | ICD-10-CM

## 2019-04-21 DIAGNOSIS — E669 Obesity, unspecified: Secondary | ICD-10-CM | POA: Diagnosis not present

## 2019-04-21 DIAGNOSIS — S80861A Insect bite (nonvenomous), right lower leg, initial encounter: Secondary | ICD-10-CM | POA: Insufficient documentation

## 2019-04-21 NOTE — Assessment & Plan Note (Signed)
AM BG upper 90-120s He denies episodes of hypoglycemia  He has been limiting CHO/sugar intake Continue Metformin XR 500mg  - 4 tabs QAM

## 2019-04-21 NOTE — Addendum Note (Signed)
Addended by: Mina Marble D on: 04/21/2019 09:30 AM   Modules accepted: Orders

## 2019-04-21 NOTE — Assessment & Plan Note (Signed)
Assessment and Plan: Continue all medications as directed Remain well hydrated and continue to follow diabetic diet Continue to wear compression socks several days per week Elevate legs when able Discussed Lyme Diease guidelines- advised to call clinic if he notices: Bulls Eye rash, diffuse joint pain, body aches, fever,  Anorexia,  Follow Up Instructions: 6 weeks- lab appt for A1c check OV in 3 months   I discussed the assessment and treatment plan with the patient. The patient was provided an opportunity to ask questions and all were answered. The patient agreed with the plan and demonstrated an understanding of the instructions.   The patient was advised to call back or seek an in-person evaluation if the symptoms worsen or if the condition fails to improve as anticipated.

## 2019-04-21 NOTE — Assessment & Plan Note (Signed)
  Discussed Lyme Diease guidelines- advised to call clinic if he notices: Bulls Eye rash, diffuse joint pain, body aches, fever,  Anorexia,

## 2019-04-25 ENCOUNTER — Emergency Department (HOSPITAL_BASED_OUTPATIENT_CLINIC_OR_DEPARTMENT_OTHER): Payer: PPO

## 2019-04-25 ENCOUNTER — Emergency Department (HOSPITAL_BASED_OUTPATIENT_CLINIC_OR_DEPARTMENT_OTHER)
Admission: EM | Admit: 2019-04-25 | Discharge: 2019-04-26 | Disposition: A | Discharge status: Home / Self Care | Payer: PPO | Attending: Emergency Medicine | Admitting: Emergency Medicine

## 2019-04-25 ENCOUNTER — Other Ambulatory Visit: Payer: Self-pay

## 2019-04-25 ENCOUNTER — Encounter (HOSPITAL_BASED_OUTPATIENT_CLINIC_OR_DEPARTMENT_OTHER): Payer: Self-pay | Admitting: Emergency Medicine

## 2019-04-25 DIAGNOSIS — I10 Essential (primary) hypertension: Secondary | ICD-10-CM | POA: Insufficient documentation

## 2019-04-25 DIAGNOSIS — Z95810 Presence of automatic (implantable) cardiac defibrillator: Secondary | ICD-10-CM | POA: Insufficient documentation

## 2019-04-25 DIAGNOSIS — Z951 Presence of aortocoronary bypass graft: Secondary | ICD-10-CM | POA: Insufficient documentation

## 2019-04-25 DIAGNOSIS — S80261A Insect bite (nonvenomous), right knee, initial encounter: Secondary | ICD-10-CM | POA: Diagnosis not present

## 2019-04-25 DIAGNOSIS — E119 Type 2 diabetes mellitus without complications: Secondary | ICD-10-CM | POA: Insufficient documentation

## 2019-04-25 DIAGNOSIS — X509XXA Other and unspecified overexertion or strenuous movements or postures, initial encounter: Secondary | ICD-10-CM | POA: Insufficient documentation

## 2019-04-25 DIAGNOSIS — Y9389 Activity, other specified: Secondary | ICD-10-CM | POA: Diagnosis not present

## 2019-04-25 DIAGNOSIS — S83421A Sprain of lateral collateral ligament of right knee, initial encounter: Secondary | ICD-10-CM | POA: Insufficient documentation

## 2019-04-25 DIAGNOSIS — F17210 Nicotine dependence, cigarettes, uncomplicated: Secondary | ICD-10-CM | POA: Insufficient documentation

## 2019-04-25 DIAGNOSIS — Z79899 Other long term (current) drug therapy: Secondary | ICD-10-CM | POA: Insufficient documentation

## 2019-04-25 DIAGNOSIS — Z7901 Long term (current) use of anticoagulants: Secondary | ICD-10-CM | POA: Diagnosis not present

## 2019-04-25 DIAGNOSIS — Z8546 Personal history of malignant neoplasm of prostate: Secondary | ICD-10-CM | POA: Insufficient documentation

## 2019-04-25 DIAGNOSIS — Y929 Unspecified place or not applicable: Secondary | ICD-10-CM | POA: Diagnosis not present

## 2019-04-25 DIAGNOSIS — W57XXXA Bitten or stung by nonvenomous insect and other nonvenomous arthropods, initial encounter: Secondary | ICD-10-CM

## 2019-04-25 DIAGNOSIS — Y999 Unspecified external cause status: Secondary | ICD-10-CM | POA: Diagnosis not present

## 2019-04-25 DIAGNOSIS — S8991XA Unspecified injury of right lower leg, initial encounter: Secondary | ICD-10-CM | POA: Diagnosis present

## 2019-04-25 DIAGNOSIS — I2510 Atherosclerotic heart disease of native coronary artery without angina pectoris: Secondary | ICD-10-CM | POA: Diagnosis not present

## 2019-04-25 DIAGNOSIS — Z7984 Long term (current) use of oral hypoglycemic drugs: Secondary | ICD-10-CM | POA: Insufficient documentation

## 2019-04-25 DIAGNOSIS — M25561 Pain in right knee: Secondary | ICD-10-CM | POA: Diagnosis not present

## 2019-04-25 DIAGNOSIS — S80269A Insect bite (nonvenomous), unspecified knee, initial encounter: Secondary | ICD-10-CM

## 2019-04-25 NOTE — ED Triage Notes (Signed)
Patient co right knee pain; states noticed a tick on right lower extremity 6 days ago. States onset of pain this evening.

## 2019-04-25 NOTE — ED Provider Notes (Signed)
[facility] Provider Note: Georgena Spurling, MD, FACEP  CSN: [id number] MRN: [id number] ARRIVAL: 04/25/19 at 2231 ROOM: [place]  Knee Pain   HISTORY OF PRESENT ILLNESS  04/25/19 10:56 PM [name] is a 68 y.o. male who complains of the sudden onset of pain in his right knee when he stood up earlier this evening.  He denies any significant trauma.  He rates the pain as an 8 out of 10, worse with weightbearing, and locates it on the lateral aspect of the knee.  There is no significant swelling or discoloration of the knee.  He was bit on the knee by a tick about a week ago.  He has a scab at the site of the tick bite.  He has not had a fever or other systemic symptoms.  He has chronic edema of the lower legs and wears compression stockings.  He took Tylenol earlier with partial relief of the pain.   Past Medical History:  Diagnosis Date   Abdominal aortic aneurysm (AAA) (HCC)    3 cm per 07-02-18 US abdominal aorta US epic   Arthritis    Blood clot in vein    behind left knee   Chronic kidney disease    renal calculi   Chronic venous insufficiency LOWER EXTREMITIES   Coronary artery disease CARDIOLOGIST - DR  [id number]-  LAST 1 WK AGO -- WILL REQUEST NOTE AND STRESS TEST   Diabetes mellitus without complication ([place])    type 2   Hematuria    last year   Hyperglycemia    Hyperlipidemia    Hypertension    Mixed dyslipidemia    Myocardial infarction ([place])    Neuropathy    toes only   Nocturia    Prostate cancer ([place]) 08/18/11   gleason 8, volume 24.4cc   S/P CABG x 4    ST elevation MI (STEMI) ([place]) 02-10-2008   S/P CABG   Stroke ([place]) 2016   occ trouble wriing with right hand   Urinary hesitancy    Vision abnormalities    resolved now   Wound discharge    since jan 2019 goes to wound center  left great toe small open area changes dressing q 2 days with ointment provided by wound center, clear drainage occ      Past Surgical History:  Procedure Laterality Date   CARDIAC CATHETERIZATION  05/01/2012   grafts widely patent   CARDIOVASCULAR STRESS TEST  03-15-2010   INFERIOR WALL SCAR WITHOUT ANY MEANINGFUL ISCHEMIA/ EF 46% / LOW RISK SCAN   CORONARY ARTERY BYPASS GRAFT  02-10-2008  DR Einar Gip   X4 VESSEL DISEASE / EMERGANT CABG   CYSTOSCOPY  02/08/2012   Procedure: CYSTOSCOPY FLEXIBLE;  Surgeon: Franchot Gallo, MD;  Location: [facility];  Service: Urology;;   CYSTOSCOPY WITH RETROGRADE PYELOGRAM, URETEROSCOPY AND STENT PLACEMENT Right 09/12/2018   Procedure: CYSTOSCOPY WITH RETROGRADE PYELOGRAM, URETEROSCOPY AND STENT PLACEMENT, STONE EXTRACTION;  Surgeon: Franchot Gallo, MD;  Location: [facility];  Service: Urology;  Laterality: Right;   EP IMPLANTABLE DEVICE N/A 08/14/2016   Procedure: Loop Recorder Insertion;  Surgeon: Evans Lance, MD;  Location: [facility];  Service: Cardiovascular;  Laterality: N/A;   EXTRACORPOREAL SHOCK WAVE LITHOTRIPSY  2013   HOLMIUM LASER APPLICATION Right 3/47/4259   Procedure: HOLMIUM LASER APPLICATION;  Surgeon: Franchot Gallo, MD;  Location: [facility];  Service: Urology;  Laterality: Right;   KNEE SURGERY  age 51  RIGHT   LEFT HEART CATHETERIZATION WITH CORONARY/GRAFT ANGIOGRAM N/A 05/01/2012   Procedure: LEFT HEART CATHETERIZATION WITH Beatrix Fetters;  Surgeon: Sanda Klein, MD;  Location: [facility] C[facility];  Service: Cardiovascular;  Laterality: N/A;   MULTIPLE TEETH EXTRACTIONS (23)/ FOUR QUADRANT ALVEOLPLASTY/ MANDIBULAR LATERAL EXOSTOSES REDUCTIONS  03-24-2010   CHRONIC PERIODONTITIS   RADIOACTIVE SEED IMPLANT  02/08/2012   Procedure: RADIOACTIVE SEED IMPLANT;  Surgeon: Franchot Gallo, MD;  Location[facility];  Service: Urology;  Laterality: N/A;  C-ARM    SHOULDER SURGERY  1982   LEFT   TEE WITHOUT CARDIOVERSION N/A 08/14/2016   Procedure: TRANSESOPHAGEAL ECHOCARDIOGRAM (TEE);  Surgeon: Josue Hector, MD;  Location: [facility];  Service: Cardiovascular;  Laterality: N/A;   URETERAL STENT PLACEMENT      Family History  Problem Relation Age of Onset   Cancer Father        pancreatic   Diabetes Father     Social History   Tobacco Use   Smoking status: Current Every Day Smoker    Packs/day: 1.00    Years: 50.00    Pack years: 50.00    Types: Cigarettes   Smokeless tobacco: Never Used   Tobacco comment: PREVIOUSLY SMOKED 3PPD / DECREASED TO 1PPD SINCE 2009  Substance Use Topics   Alcohol use: Not Currently    Alcohol/week: 2.0 standard drinks    Types: 1 Cans of beer, 1 Shots of liquor per week    Comment: none in 3 years   Drug use: No    Prior to Admission medications   Medication Sig Start Date End Date Taking? Authorizing Provider  apixaban (ELIQUIS) 5 MG TABS tablet Take 1 tablet by mouth twice daily.  Please schedule MD appt for further refills 01/28/19   Croitoru, Mihai, MD  atorvastatin (LIPITOR) 20 MG tablet Take 1 tablet (20 mg total) by mouth daily. 02/05/19   Mina Marble D, NP  buPROPion (WELLBUTRIN XL) 150 MG 24 hr tablet Take 1 tablet by mouth twice daily 03/07/19   Danford, Valetta Fuller D, NP  Continuous Blood Gluc Receiver (FREESTYLE LIBRE 14 DAY READER) DEVI 1 Device by Does not apply route daily. Use to check blood sugars every morning and 2 hours after largest meals 06/20/18   Danford, Valetta Fuller D, NP  Continuous Blood Gluc Sensor (FREESTYLE LIBRE 14 DAY SENSOR) MISC 1 each by Does not apply route daily. Use to check blood sugars every morning fasting and 2 hours after largest meal 07/22/18   Danford, Valetta Fuller D, NP  finasteride (PROSCAR) 5 MG tablet Take 5 mg by mouth daily. 06/26/18   [provider]  gabapentin (NEURONTIN) 300 MG capsule TAKE 1 CAPSULE BY MOUTH IN THE MORNING AND 2 CAPSULES AT [facility] TIME 04/21/19   Mina Marble D, NP  glucose blood (FREESTYLE LITE) test strip Use to check blood sugars every morning fasting 04/02/18   Danford, Valetta Fuller D, NP    lisinopril (PRINIVIL,ZESTRIL) 5 MG tablet TAKE 1/2 (ONE-HALF) TABLET BY MOUTH ONCE DAILY 02/25/19   Croitoru, Mihai, MD  metFORMIN (GLUCOPHAGE-XR) 500 MG 24 hr tablet Take 4 tablets daily with breakfast Patient taking differently: Take 1,000 mg by mouth 2 (two) times daily. Take 4 tablets daily with breakfast 02/07/19   Danford, Valetta Fuller D, NP  Multiple Vitamin (MULTIVITAMIN WITH MINERALS) TABS tablet Take 1 tablet by mouth daily. Men's One-A-Day Multivitamin    [provider]  oxybutynin (DITROPAN) 5 MG tablet Take 1 tablet (5 mg total) by mouth every 8 (  eight) hours as needed for bladder spasms. 09/12/18   Franchot Gallo, MD  sildenafil (VIAGRA) 100 MG tablet Take 100 mg by mouth daily as needed for erectile dysfunction.    [provider]  silodosin (RAPAFLO) 4 MG CAPS capsule Take 1 capsule by mouth once daily 04/04/19   Croitoru, Mihai, MD  tamsulosin (FLOMAX) 0.4 MG CAPS capsule Take 0.4 mg by mouth daily after supper.    [provider]    Allergies Bee venom; Oysters [shellfish allergy]; and Penicillins   REVIEW OF SYSTEMS  Negative except as noted here or in the History of Present Illness.   PHYSICAL EXAMINATION  Initial Vital Signs Blood pressure 121/70, pulse 75, temperature (!) 97.1 F (36.2 C), temperature source Oral, resp. rate 18, height 5\' 8"  (1.727 m), weight 102 kg, SpO2 98 %.  Examination General: Well-developed, well-nourished male in no acute distress; appearance consistent with age of record HENT: normocephalic; atraumatic Eyes: Normal appearance Neck: supple Heart: regular rate and rhythm Lungs: clear to auscultation bilaterally Abdomen: soft; nondistended; nontender; bowel sounds present Extremities: No deformity; pain on movement of right knee without instability, ecchymosis, erythema, warmth or effusion; tenderness of right knee over lateral collateral ligament; compression stockings on lower legs Neurologic: Awake, alert and  oriented; motor function intact in all extremities and symmetric; no facial droop Skin: Warm and dry; healing scab right popliteal fossa without signs of infection Psychiatric: Normal mood and affect   RESULTS  Summary of this visit's results, reviewed by myself:   EKG Interpretation  Date/Time:    Ventricular Rate:    PR Interval:    QRS Duration:   QT Interval:    QTC Calculation:   R Axis:     Text Interpretation:        Laboratory Studies: No results found for this or any previous visit (from the past 24 hour(s)). Imaging Studies: Dg Knee Complete 4 Views Right  Result Date: 04/25/2019 CLINICAL DATA:  Right knee pain EXAM: RIGHT KNEE - COMPLETE 4+ VIEW COMPARISON:  None. FINDINGS: Degenerative changes in the right knee, most pronounced in the medial compartment with joint space narrowing and spurring. No acute bony abnormality. Specifically, no fracture, subluxation, or dislocation. No joint effusion. IMPRESSION: No acute bony abnormality. Electronically Signed   By: Rolm Baptise M.D.   On: 04/25/2019 23:42    ED COURSE and MDM  Nursing notes and initial vitals signs, including pulse oximetry, reviewed.  Vitals:   04/25/19 2243 04/25/19 2246  BP: 121/70   Pulse: 75   Resp: 18   Temp: (!) 97.1 F (36.2 C)   TempSrc: Oral   SpO2: 98%   Weight:  102 kg  Height:  5\' 8"  (1.727 m)   Would like to try a knee sleeve rather than a full knee immobilizer.  He declines crutches or analgesics.  I do not believe the tick bite needs any antibiotics at this time but he will be given instructions on tick bites and signs and symptoms to be on the look out for.  PROCEDURES    ED DIAGNOSES     ICD-10-CM   1. Sprain of lateral collateral ligament of right knee, initial encounter S83.421A   2. Tick bite of knee, initial encounter D98.338S    W57.Encarnacion Chu, MD 04/25/19 [id number]

## 2019-04-25 NOTE — ED Notes (Signed)
Pt. Reports he found a tick behind the R knee last week and his wife pulled the tick off.  Pt. Has small scabbing area noted behind the R knee.

## 2019-04-26 NOTE — ED Notes (Signed)
Pt. Had wound care done and explained.   Pt. Did not want knee immobilizer he asked for knee sleeve.  Pt. Was taken out in wheel chair after full explanation of home care and follow up.

## 2019-05-05 LAB — HM DIABETES EYE EXAM

## 2019-05-12 ENCOUNTER — Other Ambulatory Visit: Payer: Self-pay

## 2019-05-12 ENCOUNTER — Encounter: Payer: Self-pay | Admitting: Gastroenterology

## 2019-05-12 ENCOUNTER — Other Ambulatory Visit (INDEPENDENT_AMBULATORY_CARE_PROVIDER_SITE_OTHER): Payer: PPO

## 2019-05-12 DIAGNOSIS — E1169 Type 2 diabetes mellitus with other specified complication: Secondary | ICD-10-CM

## 2019-05-12 DIAGNOSIS — E669 Obesity, unspecified: Secondary | ICD-10-CM

## 2019-05-13 LAB — HEMOGLOBIN A1C
Est. average glucose Bld gHb Est-mCnc: 154 mg/dL
Hgb A1c MFr Bld: 7 % — ABNORMAL HIGH (ref 4.8–5.6)

## 2019-05-14 ENCOUNTER — Ambulatory Visit (INDEPENDENT_AMBULATORY_CARE_PROVIDER_SITE_OTHER): Payer: PPO | Admitting: *Deleted

## 2019-05-14 DIAGNOSIS — I63411 Cerebral infarction due to embolism of right middle cerebral artery: Secondary | ICD-10-CM | POA: Diagnosis not present

## 2019-05-15 LAB — CUP PACEART REMOTE DEVICE CHECK
Date Time Interrogation Session: 20200521023616
Implantable Pulse Generator Implant Date: 20170821

## 2019-05-23 NOTE — Progress Notes (Signed)
Carelink Summary Report / Loop Recorder 

## 2019-06-06 ENCOUNTER — Telehealth: Payer: Self-pay | Admitting: Cardiovascular Disease

## 2019-06-06 NOTE — Telephone Encounter (Signed)
Thank you, no change in treatment strategy

## 2019-06-06 NOTE — Telephone Encounter (Signed)
Alert from [facility] for AF with RVR lasting 30 seconds with highest V rate of 182. Pt contacted and reported no CP, SHOB, and was unaware of episode. Episode similar to previous ones. Known AF episodes and pt is on Eliquis.

## 2019-06-16 ENCOUNTER — Ambulatory Visit (INDEPENDENT_AMBULATORY_CARE_PROVIDER_SITE_OTHER): Payer: PPO | Admitting: *Deleted

## 2019-06-16 DIAGNOSIS — I63411 Cerebral infarction due to embolism of right middle cerebral artery: Secondary | ICD-10-CM

## 2019-06-16 DIAGNOSIS — I480 Paroxysmal atrial fibrillation: Secondary | ICD-10-CM

## 2019-06-17 LAB — CUP PACEART REMOTE DEVICE CHECK
Date Time Interrogation Session: 20200623074807
Implantable Pulse Generator Implant Date: 20170821

## 2019-06-20 ENCOUNTER — Telehealth: Payer: Self-pay

## 2019-06-20 ENCOUNTER — Telehealth: Payer: Self-pay | Admitting: Cardiovascular Disease

## 2019-06-20 NOTE — Telephone Encounter (Signed)
Pt states he sent his transmission. Transmission received. I told him if the nurse do not call today she will call him first thing Monday morning. Pt verbalized understanding.

## 2019-06-20 NOTE — Telephone Encounter (Signed)
LMOM. Transmission received and pt to call if he has questions.

## 2019-06-20 NOTE — Telephone Encounter (Signed)
Follow up:     Patient calling stating some called him concering his monitor. Please call patient back.

## 2019-06-20 NOTE — Telephone Encounter (Signed)
Left message for patient to remind of missed remote transmission.  

## 2019-06-20 NOTE — Telephone Encounter (Signed)
Routed to device clinic 

## 2019-06-23 ENCOUNTER — Encounter: Payer: Self-pay | Admitting: Podiatry

## 2019-06-23 ENCOUNTER — Other Ambulatory Visit: Payer: Self-pay | Admitting: Cardiovascular Disease

## 2019-06-23 ENCOUNTER — Ambulatory Visit: Payer: PPO | Admitting: Podiatry

## 2019-06-23 ENCOUNTER — Other Ambulatory Visit: Payer: Self-pay

## 2019-06-23 VITALS — Temp 98.2°F

## 2019-06-23 DIAGNOSIS — L03032 Cellulitis of left toe: Secondary | ICD-10-CM | POA: Diagnosis not present

## 2019-06-23 MED ORDER — CEPHALEXIN 500 MG PO CAPS: 500.0000 mg | ORAL_CAPSULE | Freq: Three times a day (TID) | ORAL | 1 refills | Status: DC

## 2019-06-23 NOTE — Patient Instructions (Signed)

## 2019-06-24 NOTE — Progress Notes (Signed)
Subjective:   Patient ID: [name], male   DOB: 68 y.o.   MRN: [id number]   HPI Patient presents stating that about the lot of redness and swelling of his left big toe and it is been draining and painful.  Does not remember specific injury and states it is been present for several weeks   ROS      Objective:  Physical Exam  Neurovascular status was found to be intact with patient having inflammation and redness of the left hallux lateral border with no current proximal edema erythema or drainage noted     Assessment:  Paronychia of the left hallux with redness and pain associated     Plan:  H&P condition reviewed and at this point I recommended surgical excision of the lateral border and I infiltrated 60 mg like Marcaine mixture sterile prep applied and using sterile instrumentation I remove lateral border removed all proud flesh abscess tissue necrotic tissue flushed the area and applied sterile dressing and instructed on soaks.  If any redness were to occur or other changes let us know immediately

## 2019-06-25 NOTE — Progress Notes (Signed)
Carelink Summary Report / Loop Recorder 

## 2019-07-01 ENCOUNTER — Ambulatory Visit: Payer: PPO | Admitting: Sports Medicine

## 2019-07-01 ENCOUNTER — Other Ambulatory Visit: Payer: Self-pay

## 2019-07-01 ENCOUNTER — Encounter: Payer: Self-pay | Admitting: Sports Medicine

## 2019-07-01 VITALS — Temp 97.3°F

## 2019-07-01 DIAGNOSIS — M779 Enthesopathy, unspecified: Secondary | ICD-10-CM | POA: Diagnosis not present

## 2019-07-01 DIAGNOSIS — I739 Peripheral vascular disease, unspecified: Secondary | ICD-10-CM

## 2019-07-01 DIAGNOSIS — M79672 Pain in left foot: Secondary | ICD-10-CM | POA: Diagnosis not present

## 2019-07-01 DIAGNOSIS — L03032 Cellulitis of left toe: Secondary | ICD-10-CM

## 2019-07-01 DIAGNOSIS — M7989 Other specified soft tissue disorders: Secondary | ICD-10-CM

## 2019-07-01 MED ORDER — TRIAMCINOLONE ACETONIDE 10 MG/ML IJ SUSP
10.0000 mg | Freq: Once | INTRAMUSCULAR | Status: AC
  Administered 2019-07-01: 10 mg

## 2019-07-01 NOTE — Progress Notes (Signed)
Subjective: [name] is a 68 y.o. male patient who presents to office for evaluation of left foot pain. Patient complains of progressive pain especially over the last few days along the lateral side of his foot does not recall any type of injury or trauma but does state that he feels like after his nail procedure his big toe and legs have stayed swollen left greater than the right.  Patient reports that he is concerned for possible infection on the left big toe as well as concerned with the swelling on both his leg and the new pain on the side of the foot.  Patient denies any recent injury or trauma.  Patient reports that his toenail has done better after starting on antibiotics of which he has 1 more day left however is concerned about the swelling as well as pain on the lateral foot.  Patient denies any other pedal complaints at this time.  Patient denies shortness of breath.  Patient denies calf pain or any other constitutional symptoms at this time.   Patient Active Problem List   Diagnosis Date Noted  . Tick bite of right lower leg 04/21/2019  . Dyslipidemia (high LDL; low HDL) 01/28/2019  . PAD (peripheral artery disease) ([place]) 01/28/2019  . Great toe pain, left 10/21/2018  . Encounter for Medicare annual wellness exam 06/20/2018  . Laceration of left lower leg 04/17/2018  . Laceration of skin of left lower leg 04/01/2018  . Increased urinary frequency 04/01/2018  . Hematuria 01/30/2018  . Puncture wound of toe of left foot 01/07/2018  . At risk for diabetic foot ulcer 01/07/2018  . Need for pneumococcal vaccination 11/27/2017  . Screening for colon cancer 11/27/2017  . Healthcare maintenance 11/27/2017  . Paroxysmal atrial fibrillation ([place]) 11/22/2016  . History of embolic stroke 69/48/5462  . Diabetes mellitus type 2 in obese ([place]) 11/22/2016  . Venous insufficiency of both lower extremities 09/21/2016  . Stroke ([place]) 08/12/2016  . Hyperglycemia   . Essential hypertension    . Vision, loss, sudden   . CVA (cerebral infarction) 08/11/2016  . CAD - inferior MI 2009, CABG 2009, patent grafts cath 04/2012 09/23/2013  . Moderate obesity 09/23/2013  . Hyperlipidemia 09/23/2013  . Tobacco abuse 09/23/2013  . Prostate cancer ([place]) 08/18/2011  . Myocardial infarction [facility])     Current Outpatient Medications on File Prior to Visit  Medication Sig Dispense Refill  . apixaban (ELIQUIS) 5 MG TABS tablet Take 1 tablet by mouth twice daily.  Please schedule MD appt for further refills 180 tablet 3  . atorvastatin (LIPITOR) 20 MG tablet Take 1 tablet (20 mg total) by mouth daily. 90 tablet 1  . buPROPion (WELLBUTRIN XL) 150 MG 24 hr tablet Take 1 tablet by mouth twice daily 180 tablet 0  . cephALEXin (KEFLEX) 500 MG capsule Take 1 capsule (500 mg total) by mouth 3 (three) times daily. 30 capsule 1  . Continuous Blood Gluc Receiver (FREESTYLE LIBRE 14 DAY READER) DEVI 1 Device by Does not apply route daily. Use to check blood sugars every morning and 2 hours after largest meals 1 Device 0  . Continuous Blood Gluc Sensor (FREESTYLE LIBRE 14 DAY SENSOR) MISC 1 each by Does not apply route daily. Use to check blood sugars every morning fasting and 2 hours after largest meal 12 each 2  . finasteride (PROSCAR) 5 MG tablet Take 5 mg by mouth daily.  11  . gabapentin (NEURONTIN) 300 MG capsule TAKE 1 CAPSULE BY MOUTH  IN THE MORNING AND 2 CAPSULES AT DINNER TIME 270 capsule 1  . glucose blood (FREESTYLE LITE) test strip Use to check blood sugars every morning fasting 100 each 12  . lisinopril (ZESTRIL) 5 MG tablet Take 1/2 (one-half) tablet by mouth once daily 45 tablet 2  . metFORMIN (GLUCOPHAGE-XR) 500 MG 24 hr tablet Take 4 tablets daily with breakfast (Patient taking differently: Take 1,000 mg by mouth 2 (two) times daily. Take 4 tablets daily with breakfast) 120 tablet 3  . Multiple Vitamin (MULTIVITAMIN WITH MINERALS) TABS tablet Take 1 tablet by mouth daily. Men's One-A-Day  Multivitamin    . oxybutynin (DITROPAN) 5 MG tablet Take 1 tablet (5 mg total) by mouth every 8 (eight) hours as needed for bladder spasms. 30 tablet 1  . sildenafil (VIAGRA) 100 MG tablet Take 100 mg by mouth daily as needed for erectile dysfunction.    . silodosin (RAPAFLO) 4 MG CAPS capsule Take 1 capsule by mouth once daily 90 each 0  . tamsulosin (FLOMAX) 0.4 MG CAPS capsule Take 0.4 mg by mouth daily after supper.     No current facility-administered medications on file prior to visit.     Allergies  Allergen Reactions  . Bee Venom Anaphylaxis  . Oysters [Shellfish Allergy] Swelling    Swelling of hands  . Penicillins Other (See Comments)    CAUSES "FREE BLEEDING" Has patient had a PCN reaction causing immediate rash, facial/tongue/throat swelling, SOB or lightheadedness with hypotension: No Has patient had a PCN reaction causing severe rash involving mucus membranes or skin necrosis: No Has patient had a PCN reaction that required hospitalization No Has patient had a PCN reaction occurring within the last 10 years: No If all of the above answers are "NO", then may proceed with Cephalosporin use.    Objective:  General: Alert and oriented x3 in no acute distress  Dermatology and vascular: No open lesions bilateral lower extremities, no webspace macerations, no ecchymosis bilateral, left hallux nail appears to be dry clean and intact with multiple dry fragments with no localized redness or warmth there is diffuse swelling noted to the entire hallux and entire left foot and left lower leg with also swelling and venous skin changes noted as well on the right with pedal pulses faint and 1+ pitting edema temperature gradient within normal limits.  Neurology: Gross sensation intact via light touch bilateral.  Protective sensation not assessed at this visit.  Musculoskeletal: Mild tenderness with palpation at the fifth metatarsal base on the left foot.  Strength within normal limits in  all groups bilateral.   Assessment and Plan: Problem List Items Addressed This Visit    None    Visit Diagnoses    Tendonitis    -  Primary   Relevant Medications   triamcinolone acetonide (KENALOG) 10 MG/ML injection 10 mg (Completed)   Left foot pain       Relevant Medications   triamcinolone acetonide (KENALOG) 10 MG/ML injection 10 mg (Completed)   Paronychia of great toe of left foot       PVD (peripheral vascular disease) (HCC)       Localized swelling of both lower extremities           -Complete examination performed -Discussed treatement options for healing left great toenail, for diffuse edema and PVD with venous skin changes and likely tendinitis at the left lateral foot -Advised patient to continue with Polysporin to left hallux toenail and to soak once daily for at least 1  week and to finish the antibiotics that he has -Advised patient to continue with wearing his compression garments and that likely because he stopped wearing them to recover from his toe issue he is dealing with rebound swelling so it is important for him to continue to wear his compression garments to help keep the swelling down and I advised patient if swelling continues he may want to speak with his primary care doctor about adding on a fluid pill -At the left lateral foot along the peroneal brevis tendon course I did inject this area of maximal tenderness to see if this will help alleviate some of the pain and I did advise patient if that the pain continues he will require an x-ray at next visit -After oral consent and aseptic prep, injected a mixture containing 1 ml of 2%  plain lidocaine, 1 ml 0.5% plain marcaine, 0.5 ml of kenalog 10 and 0.5 ml of dexamethasone phosphate into left fifth metatarsal base without complication. Post-injection care discussed with patient.  -Advised good supportive shoes that do not rub or irritate the foot especially with him being more swollen -Patient to return to office  as needed or sooner if condition worsens.  Landis Martins, DPM

## 2019-07-02 ENCOUNTER — Encounter: Payer: Self-pay | Admitting: Sports Medicine

## 2019-07-04 ENCOUNTER — Ambulatory Visit (INDEPENDENT_AMBULATORY_CARE_PROVIDER_SITE_OTHER): Payer: PPO

## 2019-07-04 ENCOUNTER — Ambulatory Visit (INDEPENDENT_AMBULATORY_CARE_PROVIDER_SITE_OTHER): Payer: PPO | Admitting: Podiatry

## 2019-07-04 ENCOUNTER — Other Ambulatory Visit: Payer: Self-pay

## 2019-07-04 ENCOUNTER — Other Ambulatory Visit: Payer: Self-pay | Admitting: Podiatry

## 2019-07-04 ENCOUNTER — Encounter: Payer: Self-pay | Admitting: Podiatry

## 2019-07-04 ENCOUNTER — Telehealth: Payer: Self-pay | Admitting: *Deleted

## 2019-07-04 VITALS — Temp 97.4°F

## 2019-07-04 DIAGNOSIS — M79672 Pain in left foot: Secondary | ICD-10-CM

## 2019-07-04 DIAGNOSIS — L03032 Cellulitis of left toe: Secondary | ICD-10-CM

## 2019-07-04 DIAGNOSIS — M779 Enthesopathy, unspecified: Secondary | ICD-10-CM

## 2019-07-04 DIAGNOSIS — M7752 Other enthesopathy of left foot: Secondary | ICD-10-CM

## 2019-07-04 DIAGNOSIS — M778 Other enthesopathies, not elsewhere classified: Secondary | ICD-10-CM

## 2019-07-04 NOTE — Telephone Encounter (Signed)
Pt states he had an ingrown procedure with Dr. Paulla Dolly and was seen earlier this week, but the foot is severely swollen and hot to the touch and he would like to be seen today.

## 2019-07-08 NOTE — Progress Notes (Signed)
Subjective:   Patient ID: [name], male   DOB: 68 y.o.   MRN: [id number]   HPI Patient states his nail is still bothering him somewhat and is getting pain on top of his left foot   ROS      Objective:  Physical Exam  Neurovascular status intact with patient's right foot doing okay but the left nail is mildly tender and then the left dorsal foot is sore in the metatarsal cuneiform joints F2     Assessment:  Dorsal tendinitis left with inflammation along with nail disease left     Plan:  For now I have recommended continued soaks and I did do sterile prep left and I injected the dorsal tendon complex 3 mg Kenalog 5 mg Xylocaine and advised on heat ice therapy

## 2019-07-20 LAB — CUP PACEART REMOTE DEVICE CHECK
Date Time Interrogation Session: 20200726095348
Implantable Pulse Generator Implant Date: 20170821

## 2019-07-21 ENCOUNTER — Ambulatory Visit (INDEPENDENT_AMBULATORY_CARE_PROVIDER_SITE_OTHER): Payer: PPO | Admitting: *Deleted

## 2019-07-21 DIAGNOSIS — I480 Paroxysmal atrial fibrillation: Secondary | ICD-10-CM | POA: Diagnosis not present

## 2019-07-21 DIAGNOSIS — I63411 Cerebral infarction due to embolism of right middle cerebral artery: Secondary | ICD-10-CM

## 2019-07-23 ENCOUNTER — Other Ambulatory Visit: Payer: Self-pay | Admitting: Adult Health

## 2019-07-23 NOTE — Telephone Encounter (Signed)
We have not prescribed this medication for the patient previously.  Please review and refill if appropriate.  T. Nelson, CMA  

## 2019-07-30 ENCOUNTER — Other Ambulatory Visit: Payer: Self-pay | Admitting: Adult Health

## 2019-07-30 DIAGNOSIS — E1169 Type 2 diabetes mellitus with other specified complication: Secondary | ICD-10-CM

## 2019-08-07 NOTE — Progress Notes (Signed)
Carelink Summary Report / Loop Recorder 

## 2019-08-12 ENCOUNTER — Telehealth: Payer: Self-pay

## 2019-08-12 MED ORDER — METFORMIN HCL ER 500 MG PO TB24: ORAL_TABLET | ORAL | 0 refills | Status: DC

## 2019-08-12 NOTE — Telephone Encounter (Signed)
Please call pt to schedule f/u OV for diabetes.  No further refills until pt is seen.  Charyl Bigger, CMA

## 2019-08-15 ENCOUNTER — Other Ambulatory Visit: Payer: Self-pay | Admitting: Adult Health

## 2019-08-18 ENCOUNTER — Telehealth: Payer: Self-pay

## 2019-08-18 ENCOUNTER — Telehealth: Payer: Self-pay | Admitting: Cardiovascular Disease

## 2019-08-18 NOTE — Telephone Encounter (Signed)
Please call pt to schedule f/u.  No further refills until pt is seen.  T. Nelson, CMA 

## 2019-08-18 NOTE — Telephone Encounter (Signed)
Medication Samples have been provided to the patient.  Drug name: ELIQUIS      Strength: 5 MG        Qty: 2 BOXES  LOT: [id number]  Exp.Date: 03/2021   Contacted pt who is aware that 2 boxes (14 day supply) of eliquis 5 mg is available for pick up at front desk along with [facility] patient assistance form that he should complete and bring back to the office for Dr. Sallyanne Kuster. Pt verbalized understanding.    [name] O [name] 9:39 AM 08/18/2019

## 2019-08-18 NOTE — Telephone Encounter (Signed)
  Patient calling the office for samples of medication:   1.  What medication and dosage are you requesting samples for? apixaban (ELIQUIS) 5 MG TABS tablet  2.  Are you currently out of this medication? almost

## 2019-08-22 ENCOUNTER — Ambulatory Visit (INDEPENDENT_AMBULATORY_CARE_PROVIDER_SITE_OTHER): Payer: PPO | Admitting: *Deleted

## 2019-08-22 DIAGNOSIS — I63411 Cerebral infarction due to embolism of right middle cerebral artery: Secondary | ICD-10-CM

## 2019-08-22 LAB — CUP PACEART REMOTE DEVICE CHECK
Date Time Interrogation Session: 20200828110928
Implantable Pulse Generator Implant Date: 20170821

## 2019-08-24 NOTE — Progress Notes (Signed)
Subjective:    Patient ID: [name], male    DOB: 1951/08/20, 68 y.o.   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 Encouraged to continue to reduce- stop tobacco use He reports that his wife removed a tick from "back of my R knee" sat morning.  He estimates that the tick was attached 4-5 hours.  Tick was flat. He denies fever/night sweats/body aches/joint aches/bull eyes rash He reports that his R knee was swollen sat and Sunday, however is "completely normal this morning"- Monday He denies pain or warmth when touched He denies malaise or poor appetite  Of Note- 04/15/2019- He was evaluated [facility]- for intermittent claudication of his left calf.  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 Agreeable to reducing Wellbutrin XL 150mg  to QD from BID He will continue to reduce tobacco use He denies CP/chest tightness with  exertion He denies palpitations  He needs LDCT repeat Needs Lipids, CMP   Lab Results  Component Value Date   HGBA1C 7.1 (A) 08/25/2019   HGBA1C 7.0 (H) 05/12/2019   HGBA1C 6.9 (H) 01/14/2019     Patient Care Team    Relationship Specialty Notifications Start End  Esaw Grandchild, NP PCP - General Family Medicine  11/27/17   Sanda Klein, MD PCP - Cardiology Cardiology Admissions 01/16/18     Patient Active Problem List   Diagnosis Date Noted  . Tick bite of right lower leg 04/21/2019  . Dyslipidemia (high LDL; low HDL) 01/28/2019  . PAD (peripheral artery disease) ([place]) 01/28/2019  . Great toe pain, left 10/21/2018  . Encounter for Medicare annual wellness exam 06/20/2018  . Laceration of left lower leg 04/17/2018  . Laceration of skin of left lower leg 04/01/2018  . Increased urinary frequency 04/01/2018  . Hematuria 01/30/2018  . Puncture wound of toe of left foot 01/07/2018  . At risk for diabetic foot ulcer 01/07/2018  . Need for pneumococcal vaccination 11/27/2017  . Screening for colon cancer 11/27/2017  . Healthcare maintenance 11/27/2017  . Paroxysmal atrial fibrillation ([place]) 11/22/2016  . History of embolic stroke [id number]  . Diabetes mellitus type 2 in obese ([place]) 11/22/2016  . Venous insufficiency of both lower extremities 09/21/2016  . Stroke ([place]) 08/12/2016  . Hyperglycemia   . Essential hypertension   . Vision, loss, sudden   . CVA (cerebral infarction) 08/11/2016  . CAD - inferior MI 2009, CABG 2009, patent grafts  cath 04/2012 09/23/2013  . Moderate obesity 09/23/2013  . Hyperlipidemia 09/23/2013  . Tobacco abuse 09/23/2013  . Prostate cancer ([place]) 08/18/2011  . Myocardial infarction [facility])      Past Medical History:  Diagnosis Date  . Abdominal aortic aneurysm (AAA) ([place])    3 cm per 07-02-18 US abdominal aorta US epic  . Arthritis   . Blood clot in vein    behind left knee  . Chronic kidney disease    renal calculi  . Chronic venous  insufficiency LOWER EXTREMITIES  . Coronary artery disease CARDIOLOGIST - DR  [id number]  LAST 1 WK AGO -- WILL REQUEST NOTE AND STRESS TEST  . Diabetes mellitus without complication ([place])    type 2  . Hematuria    last year  . Hyperglycemia   . Hyperlipidemia   . Hypertension   . Mixed dyslipidemia   . Myocardial infarction ([place])   . Neuropathy    toes only  . Nocturia   . Prostate cancer ([place]) 08/18/11   gleason 8, volume 24.4cc  . S/P CABG x 4   . ST elevation MI (STEMI) ([place]) 02-10-2008   S/P CABG  . Stroke [facility]) 2016   occ trouble wriing with right hand  . Urinary hesitancy   . Vision abnormalities    resolved now  . Wound discharge    since jan 2019 goes to wound center [place] left great toe small open area changes dressing q 2 days with ointment provided by wound center, clear drainage occ     Past Surgical History:  Procedure Laterality Date  . CARDIAC CATHETERIZATION  05/01/2012   grafts widely patent  . CARDIOVASCULAR STRESS TEST  03-15-2010   INFERIOR WALL SCAR WITHOUT ANY MEANINGFUL ISCHEMIA/ EF 46% / LOW RISK SCAN  . CORONARY ARTERY BYPASS GRAFT  02-10-2008  DR Einar Gip   X4 VESSEL DISEASE / [facility] CABG  . CYSTOSCOPY  02/08/2012   Procedure: CYSTOSCOPY FLEXIBLE;  Surgeon: Franchot Gallo, MD;  Location: [facility];  Service: Urology;;  . [place], URETEROSCOPY AND STENT PLACEMENT Right 09/12/2018   Procedure: CYSTOSCOPY WITH RETROGRADE PYELOGRAM, URETEROSCOPY AND STENT PLACEMENT, STONE EXTRACTION;  Surgeon: Franchot Gallo, MD;  Location: [facility];  Service: Urology;  Laterality: Right;  . EP IMPLANTABLE DEVICE N/A 08/14/2016   Procedure: Loop Recorder Insertion;  Surgeon: Evans Lance, MD;  Location: [place] CV LAB;  Service: Cardiovascular;  Laterality: N/A;  . EXTRACORPOREAL SHOCK WAVE LITHOTRIPSY  2013  . HOLMIUM LASER APPLICATION Right [id number]   Procedure: HOLMIUM LASER APPLICATION;  Surgeon: Franchot Gallo, MD;  Location: [facility];  Service: Urology;  Laterality: Right;  . KNEE SURGERY  age 67   RIGHT  . LEFT HEART CATHETERIZATION WITH CORONARY/GRAFT ANGIOGRAM N/A 05/01/2012   Procedure: LEFT HEART CATHETERIZATION WITH Beatrix Fetters;  Surgeon: Sanda Klein, MD;  Location: [place] CATH LAB;  Service: Cardiovascular;  Laterality: N/A;  . MULTIPLE TEETH EXTRACTIONS (23)/ FOUR QUADRANT ALVEOLPLASTY/ MANDIBULAR LATERAL EXOSTOSES REDUCTIONS  03-24-2010   CHRONIC PERIODONTITIS  . RADIOACTIVE SEED IMPLANT  02/08/2012   Procedure: RADIOACTIVE SEED IMPLANT;  Surgeon: Franchot Gallo, MD;  Location: [facility];  Service: Urology;  Laterality: N/A;  C-ARM   . SHOULDER SURGERY  1982   LEFT  . TEE WITHOUT CARDIOVERSION N/A 08/14/2016   Procedure: TRANSESOPHAGEAL ECHOCARDIOGRAM (TEE);  Surgeon: Josue Hector, MD;  Location: [place];  Service: Cardiovascular;  Laterality: N/A;  . URETERAL STENT PLACEMENT  Family History  Problem Relation Age of Onset  . Cancer Father        pancreatic  . Diabetes Father      Social History   Substance and Sexual Activity  Drug Use No     Social History   Substance and Sexual Activity  Alcohol Use Not Currently  . Alcohol/week: 2.0 standard drinks  . Types: 1 Cans of beer, 1 Shots of liquor per week   Comment: none in 3 years     Social History   Tobacco Use  Smoking Status Current Every Day Smoker  . Packs/day: 1.00  . Years: 50.00  . Pack years: 50.00  . Types: Cigarettes  Smokeless Tobacco Never Used  Tobacco Comment   PREVIOUSLY SMOKED 3PPD / DECREASED TO 1PPD SINCE 2009     Outpatient Encounter Medications as of 08/25/2019  Medication Sig Note  . apixaban (ELIQUIS) 5 MG TABS tablet Take 1 tablet by mouth twice daily.  Please schedule MD appt for further refills   . atorvastatin (LIPITOR) 20 MG tablet Take 1 tablet (20 mg total) by mouth daily.   Marland Kitchen buPROPion (WELLBUTRIN XL) 150 MG 24 hr tablet Take 1  tablet by mouth daily.   . Continuous Blood Gluc Receiver (FREESTYLE LIBRE 14 DAY READER) DEVI 1 Device by Does not apply route daily. Use to check blood sugars every morning and 2 hours after largest meals   . Continuous Blood Gluc Sensor (FREESTYLE LIBRE 14 DAY SENSOR) MISC USE TO CHECK BLOOD SUGARS IN THE MORNING FASTING AND  2  HOURS  AFTER  LARGEST  MEAL   . finasteride (PROSCAR) 5 MG tablet Take 5 mg by mouth daily.   Marland Kitchen gabapentin (NEURONTIN) 300 MG capsule TAKE 1 CAPSULE BY MOUTH IN THE MORNING AND 2 CAPSULES AT DINNER TIME   . glucose blood (FREESTYLE LITE) test strip Use to check blood sugars every morning fasting   . lisinopril (ZESTRIL) 5 MG tablet Take 1/2 (one-half) tablet by mouth once daily   . metFORMIN (GLUCOPHAGE-XR) 500 MG 24 hr tablet Take 4 tablets daily with breakfast.   . Multiple Vitamin (MULTIVITAMIN WITH MINERALS) TABS tablet Take 1 tablet by mouth daily. Men's One-A-Day Multivitamin   . oxybutynin (DITROPAN) 5 MG tablet Take 1 tablet (5 mg total) by mouth every 8 (eight) hours as needed for bladder spasms.   . sildenafil (VIAGRA) 100 MG tablet Take 100 mg by mouth daily as needed for erectile dysfunction. 09/02/2018: On hold due to upcoming procedure.  . silodosin (RAPAFLO) 4 MG CAPS capsule Take 1 capsule by mouth once daily   . tamsulosin (FLOMAX) 0.4 MG CAPS capsule Take 1 capsule by mouth at bedtime   . [DISCONTINUED] buPROPion (WELLBUTRIN XL) 150 MG 24 hr tablet Take 1 tablet by mouth twice daily.  PATIENT MUST HAVE OFFICE VISIT PRIOR TO ANY FURTHER REFILLS   . [DISCONTINUED] cephALEXin (KEFLEX) 500 MG capsule Take 1 capsule (500 mg total) by mouth 3 (three) times daily.   . [DISCONTINUED] metFORMIN (GLUCOPHAGE-XR) 500 MG 24 hr tablet Take 4 tablets daily with breakfast.  PATIENT NEEDS OFFICE VISIT PRIOR TO ANY FURTHER REFILLS    No facility-administered encounter medications on file as of 08/25/2019.     Allergies: Bee venom, Oysters [shellfish allergy], and  Penicillins  Body mass index is 35.43 kg/m.  Blood pressure 107/67, pulse 75, temperature 97.9 F (36.6 C), temperature source Oral, weight 233 lb (105.7 kg), SpO2 97 %.   Review of Systems  Constitutional: Positive for fatigue. Negative for activity change, appetite change, chills, diaphoresis, fever and unexpected weight change.  Eyes: Negative for visual disturbance.  Respiratory: Negative for cough, chest tightness, shortness of breath, wheezing and stridor.   Cardiovascular: Positive for leg swelling. Negative for chest pain and palpitations.  Gastrointestinal: Negative for abdominal distention, abdominal pain, blood in stool, constipation, diarrhea, nausea and vomiting.  Endocrine: Negative for cold intolerance, heat intolerance, polydipsia, polyphagia and polyuria.  Genitourinary: Negative for difficulty urinating.  Musculoskeletal: Positive for arthralgias, back pain, gait problem, joint swelling and myalgias. Negative for neck pain and neck stiffness.  Neurological: Negative for dizziness and headaches.  Hematological: Negative for adenopathy. Does not bruise/bleed easily.  Psychiatric/Behavioral: Negative for agitation, behavioral problems, confusion, decreased concentration, dysphoric mood, hallucinations, self-injury, sleep disturbance and suicidal ideas. The patient is not nervous/anxious and is not hyperactive.        Objective:   Physical Exam Vitals signs and nursing note reviewed.  Constitutional:      General: He is not in acute distress.    Appearance: He is obese. He is not ill-appearing, toxic-appearing or diaphoretic.  HENT:     Head: Normocephalic and atraumatic.  Cardiovascular:     Rate and Rhythm: Normal rate and regular rhythm.     Pulses: Normal pulses.     Heart sounds: Normal heart sounds.  Pulmonary:     Effort: Pulmonary effort is normal. No respiratory distress.     Breath sounds: Normal breath sounds. No stridor. No wheezing, rhonchi or rales.   Chest:     Chest wall: No tenderness.  Musculoskeletal:        General: Swelling present.     Right lower leg: He exhibits swelling.     Left lower leg: He exhibits swelling.     Right foot: Swelling present.     Left foot: Swelling present.  Skin:    Capillary Refill: Capillary refill takes less than 2 seconds.  Neurological:     Mental Status: He is alert and oriented to person, place, and time.  Psychiatric:        Mood and Affect: Mood normal.        Behavior: Behavior normal.        Thought Content: Thought content normal.        Judgment: Judgment normal.        Assessment & Plan:   1. Diabetes mellitus type 2 in obese ([place])   2. Encounter for screening for malignant neoplasm of respiratory organs   3. Lung nodule   4. Encounter for screening for lung cancer   5. Healthcare maintenance   6. Hyperlipidemia, unspecified hyperlipidemia type     Healthcare maintenance Continue all medications as directed. One change- reduced Wellbutrin XL 150mg - take one tablet each morning, stop evening dose. Continue to reduce tobacco use- you can do it! A1c- 7.1- very good! Continue to monitor blood glucose. Remain well hydrated, follow diabetic diet. Continue close follow-up with Cardiology. We will call you when lab results are available. Please schedule fasting lab appt in the next 1-2 weeks, office visit in 3 months. Continue to social distance and wear a mask when in public.  Diabetes mellitus type 2 in obese [facility]) Lab Results  Component Value Date   HGBA1C 7.1 (A) 08/25/2019   HGBA1C 7.0 (H) 05/12/2019   HGBA1C 6.9 (H) 01/14/2019  AM BG 90-140s, post prandial 160-180s He denies episodes of hypoglycemia Continue Metformin 500mg  t tabs BID   Hyperlipidemia  Needs fasting labs Currently on Atorvastatin 20mg  QD     FOLLOW-UP:  Return in about 3 months (around 11/24/2019) for Regular Follow Up, HTN, Hypercholestermia, Obesity, Diabetes, Depression.

## 2019-08-25 ENCOUNTER — Encounter: Payer: Self-pay | Admitting: Adult Health

## 2019-08-25 ENCOUNTER — Other Ambulatory Visit: Payer: Self-pay

## 2019-08-25 ENCOUNTER — Ambulatory Visit (INDEPENDENT_AMBULATORY_CARE_PROVIDER_SITE_OTHER): Payer: PPO | Admitting: Adult Health

## 2019-08-25 VITALS — BP 107/67 | HR 75 | Temp 97.9°F | Wt 233.0 lb

## 2019-08-25 DIAGNOSIS — R911 Solitary pulmonary nodule: Secondary | ICD-10-CM | POA: Diagnosis not present

## 2019-08-25 DIAGNOSIS — E1169 Type 2 diabetes mellitus with other specified complication: Secondary | ICD-10-CM

## 2019-08-25 DIAGNOSIS — E669 Obesity, unspecified: Secondary | ICD-10-CM | POA: Diagnosis not present

## 2019-08-25 DIAGNOSIS — E785 Hyperlipidemia, unspecified: Secondary | ICD-10-CM

## 2019-08-25 DIAGNOSIS — Z0000 Encounter for general adult medical examination without abnormal findings: Secondary | ICD-10-CM | POA: Diagnosis not present

## 2019-08-25 DIAGNOSIS — Z122 Encounter for screening for malignant neoplasm of respiratory organs: Secondary | ICD-10-CM

## 2019-08-25 LAB — POCT GLYCOSYLATED HEMOGLOBIN (HGB A1C): Hemoglobin A1C: 7.1 % — AB (ref 4.0–5.6)

## 2019-08-25 MED ORDER — METFORMIN HCL ER 500 MG PO TB24: ORAL_TABLET | ORAL | 1 refills | Status: DC

## 2019-08-25 MED ORDER — BUPROPION HCL ER (XL) 150 MG PO TB24: ORAL_TABLET | ORAL | 1 refills | Status: DC

## 2019-08-25 NOTE — Assessment & Plan Note (Signed)
Continue all medications as directed. One change- reduced Wellbutrin XL 150mg - take one tablet each morning, stop evening dose. Continue to reduce tobacco use- you can do it! A1c- 7.1- very good! Continue to monitor blood glucose. Remain well hydrated, follow diabetic diet. Continue close follow-up with Cardiology. We will call you when lab results are available. Please schedule fasting lab appt in the next 1-2 weeks, office visit in 3 months. Continue to social distance and wear a mask when in public.

## 2019-08-25 NOTE — Assessment & Plan Note (Signed)
Lab Results  Component Value Date   HGBA1C 7.1 (A) 08/25/2019   HGBA1C 7.0 (H) 05/12/2019   HGBA1C 6.9 (H) 01/14/2019  AM BG 90-140s, post prandial 160-180s He denies episodes of hypoglycemia Continue Metformin 500mg  t tabs BID

## 2019-08-25 NOTE — Assessment & Plan Note (Signed)
Needs fasting labs Currently on Atorvastatin 20mg  QD

## 2019-08-25 NOTE — Patient Instructions (Addendum)
Diabetes Mellitus and Nutrition, Adult When you have diabetes (diabetes mellitus), it is very important to have healthy eating habits because your blood sugar (glucose) levels are greatly affected by what you eat and drink. Eating healthy foods in the appropriate amounts, at about the same times every day, can help you:  Control your blood glucose.  Lower your risk of heart disease.  Improve your blood pressure.  Reach or maintain a healthy weight. Every person with diabetes is different, and each person has different needs for a meal plan. Your health care provider may recommend that you work with a diet and nutrition specialist (dietitian) to make a meal plan that is best for you. Your meal plan may vary depending on factors such as:  The calories you need.  The medicines you take.  Your weight.  Your blood glucose, blood pressure, and cholesterol levels.  Your activity level.  Other health conditions you have, such as heart or kidney disease. How do carbohydrates affect me? Carbohydrates, also called carbs, affect your blood glucose level more than any other type of food. Eating carbs naturally raises the amount of glucose in your blood. Carb counting is a method for keeping track of how many carbs you eat. Counting carbs is important to keep your blood glucose at a healthy level, especially if you use insulin or take certain oral diabetes medicines. It is important to know how many carbs you can safely have in each meal. This is different for every person. Your dietitian can help you calculate how many carbs you should have at each meal and for each snack. Foods that contain carbs include:  Bread, cereal, rice, pasta, and crackers.  Potatoes and corn.  Peas, beans, and lentils.  Milk and yogurt.  Fruit and juice.  Desserts, such as cakes, cookies, ice cream, and candy. How does alcohol affect me? Alcohol can cause a sudden decrease in blood glucose (hypoglycemia),  especially if you use insulin or take certain oral diabetes medicines. Hypoglycemia can be a life-threatening condition. Symptoms of hypoglycemia (sleepiness, dizziness, and confusion) are similar to symptoms of having too much alcohol. If your health care provider says that alcohol is safe for you, follow these guidelines:  Limit alcohol intake to no more than 1 drink per day for nonpregnant women and 2 drinks per day for men. One drink equals 12 oz of beer, 5 oz of wine, or 1 oz of hard liquor.  Do not drink on an empty stomach.  Keep yourself hydrated with water, diet soda, or unsweetened iced tea.  Keep in mind that regular soda, juice, and other mixers may contain a lot of sugar and must be counted as carbs. What are tips for following this plan?  Reading food labels  Start by checking the serving size on the "Nutrition Facts" label of packaged foods and drinks. The amount of calories, carbs, fats, and other nutrients listed on the label is based on one serving of the item. Many items contain more than one serving per package.  Check the total grams (g) of carbs in one serving. You can calculate the number of servings of carbs in one serving by dividing the total carbs by 15. For example, if a food has 30 g of total carbs, it would be equal to 2 servings of carbs.  Check the number of grams (g) of saturated and trans fats in one serving. Choose foods that have low or no amount of these fats.  Check the number of   milligrams (mg) of salt (sodium) in one serving. Most people should limit total sodium intake to less than 2,300 mg per day.  Always check the nutrition information of foods labeled as "low-fat" or "nonfat". These foods may be higher in added sugar or refined carbs and should be avoided.  Talk to your dietitian to identify your daily goals for nutrients listed on the label. Shopping  Avoid buying canned, premade, or processed foods. These foods tend to be high in fat, sodium,  and added sugar.  Shop around the outside edge of the grocery store. This includes fresh fruits and vegetables, bulk grains, fresh meats, and fresh dairy. Cooking  Use low-heat cooking methods, such as baking, instead of high-heat cooking methods like deep frying.  Cook using healthy oils, such as olive, canola, or sunflower oil.  Avoid cooking with butter, cream, or high-fat meats. Meal planning  Eat meals and snacks regularly, preferably at the same times every day. Avoid going long periods of time without eating.  Eat foods high in fiber, such as fresh fruits, vegetables, beans, and whole grains. Talk to your dietitian about how many servings of carbs you can eat at each meal.  Eat 4-6 ounces (oz) of lean protein each day, such as lean meat, chicken, fish, eggs, or tofu. One oz of lean protein is equal to: ? 1 oz of meat, chicken, or fish. ? 1 egg. ?  cup of tofu.  Eat some foods each day that contain healthy fats, such as avocado, nuts, seeds, and fish. Lifestyle  Check your blood glucose regularly.  Exercise regularly as told by your health care provider. This may include: ? 150 minutes of moderate-intensity or vigorous-intensity exercise each week. This could be brisk walking, biking, or water aerobics. ? Stretching and doing strength exercises, such as yoga or weightlifting, at least 2 times a week.  Take medicines as told by your health care provider.  Do not use any products that contain nicotine or tobacco, such as cigarettes and e-cigarettes. If you need help quitting, ask your health care provider.  Work with a Social worker or diabetes educator to identify strategies to manage stress and any emotional and social challenges. Questions to ask a health care provider  Do I need to meet with a diabetes educator?  Do I need to meet with a dietitian?  What number can I call if I have questions?  When are the best times to check my blood glucose? Where to find more  information:  American Diabetes Association: diabetes.org  Academy of Nutrition and Dietetics: www.eatright.[employer] of Diabetes and Digestive and Kidney Diseases (NIH): [employer] Summary  A healthy meal plan will help you control your blood glucose and maintain a healthy lifestyle.  Working with a diet and nutrition specialist (dietitian) can help you make a meal plan that is best for you.  Keep in mind that carbohydrates (carbs) and alcohol have immediate effects on your blood glucose levels. It is important to count carbs and to use alcohol carefully. This information is not intended to replace advice given to you by your health care provider. Make sure you discuss any questions you have with your health care provider. Document Released: 09/07/2005 Document Revised: 11/23/2017 Document Reviewed: 01/15/2017 Elsevier Patient Education  2020 [place] all medications as directed. One change- reduced Wellbutrin XL 150mg - take one tablet each morning, stop evening dose. Continue to reduce tobacco use- you can do it! A1c- 7.1- very good! Continue to monitor  blood glucose. Remain well hydrated, follow diabetic diet. Continue close follow-up with Cardiology. We will call you when lab results are available. Please schedule fasting lab appt in the next 1-2 weeks, office visit in 3 months. Continue to social distance and wear a mask when in public. GREAT TO SEE YOU!

## 2019-08-25 NOTE — Assessment & Plan Note (Signed)
>>  ASSESSMENT AND PLAN FOR HYPERLIPIDEMIA WRITTEN ON 08/25/2019 12:21 PM BY DANFORD, KATY D, NP  Needs fasting labs Currently on Atorvastatin 20mg  QD

## 2019-08-26 ENCOUNTER — Encounter: Payer: Self-pay | Admitting: Adult Health

## 2019-08-26 LAB — COMPREHENSIVE METABOLIC PANEL
ALT: 19 IU/L (ref 0–44)
AST: 15 IU/L (ref 0–40)
Albumin/Globulin Ratio: 1.6 (ref 1.2–2.2)
Albumin: 4.4 g/dL (ref 3.8–4.8)
Alkaline Phosphatase: 124 IU/L — ABNORMAL HIGH (ref 39–117)
BUN/Creatinine Ratio: 14 (ref 10–24)
BUN: 11 mg/dL (ref 8–27)
Bilirubin Total: 0.2 mg/dL (ref 0.0–1.2)
CO2: 23 mmol/L (ref 20–29)
Calcium: 9.4 mg/dL (ref 8.6–10.2)
Chloride: 102 mmol/L (ref 96–106)
Creatinine, Ser: 0.77 mg/dL (ref 0.76–1.27)
GFR calc Af Amer: 109 mL/min/{1.73_m2} (ref >59)
GFR calc non Af Amer: 94 mL/min/{1.73_m2} (ref >59)
Globulin, Total: 2.8 g/dL (ref 1.5–4.5)
Glucose: 191 mg/dL — ABNORMAL HIGH (ref 65–99)
Potassium: 4.4 mmol/L (ref 3.5–5.2)
Sodium: 138 mmol/L (ref 134–144)
Total Protein: 7.2 g/dL (ref 6.0–8.5)

## 2019-08-26 NOTE — Progress Notes (Signed)
Carelink Summary Report / Loop Recorder 

## 2019-09-02 ENCOUNTER — Other Ambulatory Visit: Payer: Self-pay

## 2019-09-02 ENCOUNTER — Other Ambulatory Visit: Payer: PPO

## 2019-09-02 DIAGNOSIS — I10 Essential (primary) hypertension: Secondary | ICD-10-CM | POA: Diagnosis not present

## 2019-09-02 DIAGNOSIS — Z0000 Encounter for general adult medical examination without abnormal findings: Secondary | ICD-10-CM | POA: Diagnosis not present

## 2019-09-02 DIAGNOSIS — E1169 Type 2 diabetes mellitus with other specified complication: Secondary | ICD-10-CM | POA: Diagnosis not present

## 2019-09-02 DIAGNOSIS — E785 Hyperlipidemia, unspecified: Secondary | ICD-10-CM

## 2019-09-02 DIAGNOSIS — E119 Type 2 diabetes mellitus without complications: Secondary | ICD-10-CM

## 2019-09-02 DIAGNOSIS — E669 Obesity, unspecified: Secondary | ICD-10-CM

## 2019-09-04 ENCOUNTER — Other Ambulatory Visit: Payer: Self-pay | Admitting: Adult Health

## 2019-09-04 DIAGNOSIS — M25561 Pain in right knee: Secondary | ICD-10-CM | POA: Diagnosis not present

## 2019-09-04 DIAGNOSIS — G8929 Other chronic pain: Secondary | ICD-10-CM | POA: Diagnosis not present

## 2019-09-04 DIAGNOSIS — M25551 Pain in right hip: Secondary | ICD-10-CM | POA: Diagnosis not present

## 2019-09-04 LAB — COMPREHENSIVE METABOLIC PANEL
ALT: 22 IU/L (ref 0–44)
AST: 20 IU/L (ref 0–40)
Albumin/Globulin Ratio: 1.8 (ref 1.2–2.2)
Albumin: 4.4 g/dL (ref 3.8–4.8)
Alkaline Phosphatase: 130 IU/L — ABNORMAL HIGH (ref 39–117)
BUN/Creatinine Ratio: 14 (ref 10–24)
BUN: 10 mg/dL (ref 8–27)
Bilirubin Total: 0.3 mg/dL (ref 0.0–1.2)
CO2: 21 mmol/L (ref 20–29)
Calcium: 9.4 mg/dL (ref 8.6–10.2)
Chloride: 102 mmol/L (ref 96–106)
Creatinine, Ser: 0.72 mg/dL — ABNORMAL LOW (ref 0.76–1.27)
GFR calc Af Amer: 112 mL/min/{1.73_m2} (ref >59)
GFR calc non Af Amer: 97 mL/min/{1.73_m2} (ref >59)
Globulin, Total: 2.5 g/dL (ref 1.5–4.5)
Glucose: 133 mg/dL — ABNORMAL HIGH (ref 65–99)
Potassium: 4 mmol/L (ref 3.5–5.2)
Sodium: 140 mmol/L (ref 134–144)
Total Protein: 6.9 g/dL (ref 6.0–8.5)

## 2019-09-04 LAB — CBC WITH DIFFERENTIAL/PLATELET
Basophils Absolute: 0.1 10*3/uL (ref 0.0–0.2)
Basos: 1 %
EOS (ABSOLUTE): 0.3 10*3/uL (ref 0.0–0.4)
Eos: 2 %
Hematocrit: 40.7 % (ref 37.5–51.0)
Hemoglobin: 13.6 g/dL (ref 13.0–17.7)
Immature Grans (Abs): 0 10*3/uL (ref 0.0–0.1)
Immature Granulocytes: 0 %
Lymphocytes Absolute: 5.1 10*3/uL — ABNORMAL HIGH (ref 0.7–3.1)
Lymphs: 40 %
MCH: 28 pg (ref 26.6–33.0)
MCHC: 33.4 g/dL (ref 31.5–35.7)
MCV: 84 fL (ref 79–97)
Monocytes Absolute: 0.8 10*3/uL (ref 0.1–0.9)
Monocytes: 6 %
Neutrophils Absolute: 6.5 10*3/uL (ref 1.4–7.0)
Neutrophils: 51 %
Platelets: 222 10*3/uL (ref 150–450)
RBC: 4.85 x10E6/uL (ref 4.14–5.80)
RDW: 15.8 % — ABNORMAL HIGH (ref 11.6–15.4)
WBC: 12.8 10*3/uL — ABNORMAL HIGH (ref 3.4–10.8)

## 2019-09-04 LAB — LIPID PANEL
Chol/HDL Ratio: 2.5 ratio (ref 0.0–5.0)
Cholesterol, Total: 75 mg/dL — ABNORMAL LOW (ref 100–199)
HDL: 30 mg/dL — ABNORMAL LOW (ref >39)
LDL Chol Calc (NIH): 25 mg/dL (ref 0–99)
Triglycerides: 106 mg/dL (ref 0–149)
VLDL Cholesterol Cal: 20 mg/dL (ref 5–40)

## 2019-09-04 LAB — HEMOGLOBIN A1C
Est. average glucose Bld gHb Est-mCnc: 163 mg/dL
Hgb A1c MFr Bld: 7.3 % — ABNORMAL HIGH (ref 4.8–5.6)

## 2019-09-04 LAB — TSH: TSH: 1.71 u[IU]/mL (ref 0.450–4.500)

## 2019-09-04 MED ORDER — ATORVASTATIN CALCIUM 10 MG PO TABS: 10.0000 mg | ORAL_TABLET | Freq: Every day | ORAL | 0 refills | Status: DC

## 2019-09-05 ENCOUNTER — Other Ambulatory Visit: Payer: Self-pay | Admitting: *Deleted

## 2019-09-05 ENCOUNTER — Telehealth: Payer: Self-pay | Admitting: Cardiovascular Disease

## 2019-09-05 ENCOUNTER — Telehealth: Payer: Self-pay | Admitting: *Deleted

## 2019-09-05 MED ORDER — APIXABAN 5 MG PO TABS: ORAL_TABLET | ORAL | 3 refills | Status: DC

## 2019-09-05 NOTE — Telephone Encounter (Signed)
Patient calling the office for samples of medication:   1.  What medication and dosage are you requesting samples for? [name] 5 mg  2.  Are you currently out of this medication? yes

## 2019-09-05 NOTE — Telephone Encounter (Signed)
Patient assistance for Eliquis has been faxed to [facility].

## 2019-09-05 NOTE — Telephone Encounter (Signed)
Samples of Eliquis were given to the patient, quantity: 1 box, Lot Number: [id number]

## 2019-09-10 ENCOUNTER — Other Ambulatory Visit: Payer: Self-pay | Admitting: Cardiovascular Disease

## 2019-09-11 ENCOUNTER — Ambulatory Visit (INDEPENDENT_AMBULATORY_CARE_PROVIDER_SITE_OTHER): Payer: PPO | Admitting: Adult Health

## 2019-09-11 ENCOUNTER — Encounter: Payer: Self-pay | Admitting: Adult Health

## 2019-09-11 ENCOUNTER — Other Ambulatory Visit: Payer: Self-pay

## 2019-09-11 DIAGNOSIS — E1169 Type 2 diabetes mellitus with other specified complication: Secondary | ICD-10-CM

## 2019-09-11 DIAGNOSIS — Z720 Tobacco use: Secondary | ICD-10-CM

## 2019-09-11 DIAGNOSIS — E669 Obesity, unspecified: Secondary | ICD-10-CM

## 2019-09-11 DIAGNOSIS — S81802A Unspecified open wound, left lower leg, initial encounter: Secondary | ICD-10-CM

## 2019-09-11 MED ORDER — NICOTINE 21 MG/24HR TD PT24: 21.0000 mg | MEDICATED_PATCH | Freq: Every day | TRANSDERMAL | 1 refills | Status: DC

## 2019-09-11 MED ORDER — DOXYCYCLINE HYCLATE 100 MG PO TABS: 100.0000 mg | ORAL_TABLET | Freq: Two times a day (BID) | ORAL | 0 refills | Status: DC

## 2019-09-11 NOTE — Assessment & Plan Note (Signed)
Lab Results  Component Value Date   HGBA1C 7.3 (H) 09/02/2019   HGBA1C 7.1 (A) 08/25/2019   HGBA1C 7.0 (H) 05/12/2019

## 2019-09-11 NOTE — Patient Instructions (Addendum)
Wound Infection A wound infection happens when germs start to grow in a wound. Germs that cause wound infections are most often bacteria. Other types of infections can occur as well. An infection can cause the wound to break open. Wound infections need treatment. If a wound infection is not treated, problems can happen. What are the causes?  Most often caused by germs (bacteria) that grow in a wound.  Other germs, such as yeast and funguses, can also cause wound infections. What increases the risk?  Having a weak body defense system (immune system).  Having diabetes.  Taking certain medicines (steroids) for a long time.  Smoking.  Being an older person.  Being overweight.  Taking certain medicines for cancer treatment. What are the signs or symptoms?  Having more redness, swelling, or pain at the wound site.  Having more blood or fluid at the wound site.  A bad smell coming from a wound or bandage (dressing).  Having a fever.  Feeling very tired.  Having warmth at or around the wound.  Having pus at the wound site. How is this treated?  This condition is most often treated with an antibiotic medicine. ? The infection should improve 24-48 hours after you start antibiotics. ? After 24-48 hours, redness around the wound should stop spreading. The wound should also be less painful. Follow these instructions at home: Medicines  Take or apply over-the-counter and prescription medicines only as told by your doctor.  If you were prescribed an antibiotic medicine, take or apply it as told by your doctor. Do not stop using the antibiotic even if you start to feel better. Wound care   Clean the wound each day, or as told by your doctor. ? Wash the wound with mild soap and water. ? Rinse the wound with water to remove all soap. ? Pat the wound dry with a clean towel. Do not rub it.  Follow instructions from your doctor about how to take care of your wound. Make sure  you: ? Wash your hands with soap and water before and after you change your bandage. If you cannot use soap and water, use hand sanitizer. ? Change your bandage as told by your doctor. ? Leave stitches (sutures), skin glue, or skin tape (adhesive) strips in place if your wound has been closed. They may need to stay in place for 2 weeks or longer. If tape strips get loose and curl up, you may trim the loose edges. Do not remove tape strips completely unless your doctor says it is okay. Some wounds are left open to heal on their own.  Check your wound every day for signs of infection. Watch for: ? More redness, swelling, or pain. ? More fluid or blood. ? Warmth. ? Pus or a bad smell. General instructions  Keep the bandage dry until your doctor says it can be removed.  Do not take baths, swim, or use a hot tub until your doctor approves. Ask your doctor if you may take showers. You may only be allowed to take sponge baths.  Raise (elevate) the injured area above the level of your heart while you are sitting or lying down.  Do not scratch or pick at the wound.  Keep all follow-up visits as told by your doctor. This is important. Contact a doctor if:  Medicine does not help your pain.  You have more redness, swelling, or pain around your wound.  You have more fluid or blood coming from your wound.    Your wound feels warm to the touch.  You have pus coming from your wound.  You notice a bad smell coming from your wound or your bandage.  Your wound that was closed breaks open. Get help right away if:  You have a red streak going away from your wound.  You have a fever. Summary  A wound infection happens when germs start to grow in a wound.  This condition is usually treated with an antibiotic medicine.  Follow instructions from your doctor about how to take care of your wound.  Contact a doctor if your wound infection does not start to get better in 24-48 hours, or your symptoms  get worse.  Keep all follow-up visits as told by your doctor. This is important. This information is not intended to replace advice given to you by your health care provider. Make sure you discuss any questions you have with your health care provider. Document Released: 09/19/2008 Document Revised: 07/23/2018 Document Reviewed: 07/23/2018 Elsevier Patient Education  2020 [place] not use any more of the Cephalexin. Please start Doxycyline 100mg  twice daily- take for 10 days. Ensure to complete the entire course of antibiotic. Remain well hydrated, follow diabetic diet. After 6 weeks on Nicoderm 21mg /day- please call clinic for refill of new 14mg /day rx. If symptoms persist after Doxycyline completed, please call clinic. Continue to social distance and wear a mask when in public. FEEL BETTER!

## 2019-09-11 NOTE — Assessment & Plan Note (Signed)
Since he has sporadically taken 1/2 rx of Cephalexin - will change ABX class Do not use any more of the Cephalexin. Please start Doxycyline 100mg  twice daily- take for 10 days. Ensure to complete the entire course of antibiotic. Remain well hydrated, follow diabetic diet. After 6 weeks on Nicoderm 21mg /day- please call clinic for refill of new 14mg /day rx. If symptoms persist after Doxycyline completed, please call clinic. Continue to social distance and wear a mask when in public.

## 2019-09-11 NOTE — Progress Notes (Signed)
Subjective:    Patient ID: [name], male    DOB: 1951-01-20, 68 y.o.   MRN: [id number]  HPI:  [name] presents with LLE non-healing wound that developed >4 weeks ago.  He reports repeatedly striking LLE on "steal deck". He reports tenderness when area is touched, denies pain at rest. He denies fever/night sweats/malaise/poor appetite He reports taking 13 tablets of 30 tablet rx of Cephalexin 500mg  that he had left over from treatment for infection of L foot- treated by Podiatry/Dr. Paulla Dolly He thinks the last was 3 weeks ago?  Lab Results  Component Value Date   HGBA1C 7.3 (H) 09/02/2019   HGBA1C 7.1 (A) 08/25/2019   HGBA1C 7.0 (H) 05/12/2019   09/02/2019 CMP GFR 97 Patient Care Team    Relationship Specialty Notifications Start End  Esaw Grandchild, NP PCP - General Family Medicine  11/27/17   Sanda Klein, MD PCP - Cardiology Cardiology Admissions 01/16/18     Patient Active Problem List   Diagnosis Date Noted  . Wound of left leg 09/11/2019  . Tick bite of right lower leg 04/21/2019  . Dyslipidemia (high LDL; low HDL) 01/28/2019  . PAD (peripheral artery disease) ([place]) 01/28/2019  . Great toe pain, left 10/21/2018  . Encounter for Medicare annual wellness exam 06/20/2018  . Laceration of left lower leg 04/17/2018  . Laceration of skin of left lower leg 04/01/2018  . Increased urinary frequency 04/01/2018  . Hematuria 01/30/2018  . Puncture wound of toe of left foot 01/07/2018  . At risk for diabetic foot ulcer 01/07/2018  . Need for pneumococcal vaccination 11/27/2017  . Screening for colon cancer 11/27/2017  . Healthcare maintenance 11/27/2017  . Paroxysmal atrial fibrillation ([place]) 11/22/2016  . History of embolic stroke [id number]  . Diabetes mellitus type 2 in obese ([place]) 11/22/2016  . Venous insufficiency of both lower extremities 09/21/2016  . Stroke ([place]) 08/12/2016  . Hyperglycemia   . Essential hypertension   . Vision, loss, sudden   . CVA  (cerebral infarction) 08/11/2016  . CAD - inferior MI 2009, CABG 2009, patent grafts cath 04/2012 09/23/2013  . Moderate obesity 09/23/2013  . Hyperlipidemia 09/23/2013  . Tobacco abuse 09/23/2013  . Prostate cancer ([place]) 08/18/2011  . Myocardial infarction [facility])      Past Medical History:  Diagnosis Date  . Abdominal aortic aneurysm (AAA) ([place])    3 cm per 07-02-18 US abdominal aorta US epic  . Arthritis   . Blood clot in vein    behind left knee  . Chronic kidney disease    renal calculi  . Chronic venous insufficiency LOWER EXTREMITIES  . Coronary artery disease CARDIOLOGIST - DR  [id number]  LAST 1 WK AGO -- WILL REQUEST NOTE AND STRESS TEST  . Diabetes mellitus without complication ([place])    type 2  . Hematuria    last year  . Hyperglycemia   . Hyperlipidemia   . Hypertension   . Mixed dyslipidemia   . Myocardial infarction ([place])   . Neuropathy    toes only  . Nocturia   . Prostate cancer ([place]) 08/18/11   gleason 8, volume 24.4cc  . S/P CABG x 4   . ST elevation MI (STEMI) ([place]) 02-10-2008   S/P CABG  . Stroke [facility]) 2016   occ trouble wriing with right hand  . Urinary hesitancy   . Vision abnormalities    resolved now  . Wound discharge    since jan 2019 goes to wound  center [place] left great toe small open area changes dressing q 2 days with ointment provided by wound center, clear drainage occ     Past Surgical History:  Procedure Laterality Date  . CARDIAC CATHETERIZATION  05/01/2012   grafts widely patent  . CARDIOVASCULAR STRESS TEST  03-15-2010   INFERIOR WALL SCAR WITHOUT ANY MEANINGFUL ISCHEMIA/ EF 46% / LOW RISK SCAN  . CORONARY ARTERY BYPASS GRAFT  02-10-2008  DR Einar Gip   X4 VESSEL DISEASE / [facility] CABG  . CYSTOSCOPY  02/08/2012   Procedure: CYSTOSCOPY FLEXIBLE;  Surgeon: Franchot Gallo, MD;  Location: [facility];  Service: Urology;;  . [place], URETEROSCOPY AND STENT PLACEMENT Right 09/12/2018    Procedure: CYSTOSCOPY WITH RETROGRADE PYELOGRAM, URETEROSCOPY AND STENT PLACEMENT, STONE EXTRACTION;  Surgeon: Franchot Gallo, MD;  Location: [facility];  Service: Urology;  Laterality: Right;  . EP IMPLANTABLE DEVICE N/A 08/14/2016   Procedure: Loop Recorder Insertion;  Surgeon: Evans Lance, MD;  Location: [place] CV LAB;  Service: Cardiovascular;  Laterality: N/A;  . EXTRACORPOREAL SHOCK WAVE LITHOTRIPSY  2013  . HOLMIUM LASER APPLICATION Right [id number]   Procedure: HOLMIUM LASER APPLICATION;  Surgeon: Franchot Gallo, MD;  Location: [facility];  Service: Urology;  Laterality: Right;  . KNEE SURGERY  age 65   RIGHT  . LEFT HEART CATHETERIZATION WITH CORONARY/GRAFT ANGIOGRAM N/A 05/01/2012   Procedure: LEFT HEART CATHETERIZATION WITH Beatrix Fetters;  Surgeon: Sanda Klein, MD;  Location: [place] CATH LAB;  Service: Cardiovascular;  Laterality: N/A;  . MULTIPLE TEETH EXTRACTIONS (23)/ FOUR QUADRANT ALVEOLPLASTY/ MANDIBULAR LATERAL EXOSTOSES REDUCTIONS  03-24-2010   CHRONIC PERIODONTITIS  . RADIOACTIVE SEED IMPLANT  02/08/2012   Procedure: RADIOACTIVE SEED IMPLANT;  Surgeon: Franchot Gallo, MD;  Location: [facility];  Service: Urology;  Laterality: N/A;  C-ARM   . SHOULDER SURGERY  1982   LEFT  . TEE WITHOUT CARDIOVERSION N/A 08/14/2016   Procedure: TRANSESOPHAGEAL ECHOCARDIOGRAM (TEE);  Surgeon: Josue Hector, MD;  Location[facility] ENDOSCOPY;  Service: Cardiovascular;  Laterality: N/A;  . URETERAL STENT PLACEMENT       Family History  Problem Relation Age of Onset  . Cancer Father        pancreatic  . Diabetes Father      Social History   Substance and Sexual Activity  Drug Use No     Social History   Substance and Sexual Activity  Alcohol Use Not Currently  . Alcohol/week: 2.0 standard drinks  . Types: 1 Cans of beer, 1 Shots of liquor per week   Comment: none in 3 years     Social History   Tobacco Use  Smoking Status Current Every Day  Smoker  . Packs/day: 1.00  . Years: 50.00  . Pack years: 50.00  . Types: Cigarettes  Smokeless Tobacco Never Used  Tobacco Comment   PREVIOUSLY SMOKED 3PPD / DECREASED TO 1PPD SINCE 2009     Outpatient Encounter Medications as of 09/11/2019  Medication Sig Note  . apixaban (ELIQUIS) 5 MG TABS tablet Take 1 tablet by mouth twice daily.   Marland Kitchen atorvastatin (LIPITOR) 10 MG tablet Take 1 tablet (10 mg total) by mouth daily.   Marland Kitchen buPROPion (WELLBUTRIN XL) 150 MG 24 hr tablet Take 1 tablet by mouth daily.   . Continuous Blood Gluc Receiver (FREESTYLE LIBRE 14 DAY READER) DEVI 1 Device by Does not apply route daily. Use to check blood sugars every morning and 2 hours after largest meals   .  Continuous Blood Gluc Sensor (FREESTYLE LIBRE 14 DAY SENSOR) MISC USE TO CHECK BLOOD SUGARS IN THE MORNING FASTING AND  2  HOURS  AFTER  LARGEST  MEAL   . finasteride (PROSCAR) 5 MG tablet Take 5 mg by mouth daily.   Marland Kitchen gabapentin (NEURONTIN) 300 MG capsule TAKE 1 CAPSULE BY MOUTH IN THE MORNING AND 2 CAPSULES AT DINNER TIME   . glucose blood (FREESTYLE LITE) test strip Use to check blood sugars every morning fasting   . lisinopril (ZESTRIL) 5 MG tablet Take 1/2 (one-half) tablet by mouth once daily   . metFORMIN (GLUCOPHAGE-XR) 500 MG 24 hr tablet Take 4 tablets daily with breakfast.   . Multiple Vitamin (MULTIVITAMIN WITH MINERALS) TABS tablet Take 1 tablet by mouth daily. Men's One-A-Day Multivitamin   . oxybutynin (DITROPAN) 5 MG tablet Take 1 tablet (5 mg total) by mouth every 8 (eight) hours as needed for bladder spasms.   . sildenafil (VIAGRA) 100 MG tablet Take 100 mg by mouth daily as needed for erectile dysfunction. 09/02/2018: On hold due to upcoming procedure.  . silodosin (RAPAFLO) 4 MG CAPS capsule Take 1 capsule by mouth once daily   . tamsulosin (FLOMAX) 0.4 MG CAPS capsule Take 1 capsule by mouth at bedtime   . doxycycline (VIBRA-TABS) 100 MG tablet Take 1 tablet (100 mg total) by mouth 2 (two) times  daily.   . nicotine (NICODERM CQ - DOSED IN MG/24 HOURS) 21 mg/24hr patch Place 1 patch (21 mg total) onto the skin daily.    No facility-administered encounter medications on file as of 09/11/2019.     Allergies: Bee venom, Oysters [shellfish allergy], and Penicillins  Body mass index is 35.12 kg/m.  Blood pressure 113/68, pulse 70, temperature 97.9 F (36.6 C), temperature source Oral, height 5' 8.25" (1.734 m), weight 232 lb 11.2 oz (105.6 kg), SpO2 97 %.  Review of Systems  Constitutional: Positive for fatigue. Negative for activity change, appetite change, chills, diaphoresis, fever and unexpected weight change.  Eyes: Negative for visual disturbance.  Respiratory: Negative for cough, chest tightness, shortness of breath, wheezing and stridor.   Cardiovascular: Negative for chest pain, palpitations and leg swelling.  Endocrine: Negative for cold intolerance, heat intolerance, polydipsia, polyphagia and polyuria.  Musculoskeletal: Positive for arthralgias, gait problem, joint swelling and myalgias. Negative for back pain.  Skin: Positive for color change and wound. Negative for pallor and rash.  Neurological: Negative for dizziness and headaches.  Hematological: Negative for adenopathy. Does not bruise/bleed easily.       Objective:   Physical Exam Vitals signs and nursing note reviewed.  Constitutional:      Appearance: Normal appearance.  HENT:     Head: Normocephalic and atraumatic.  Eyes:     Comments: Pinpoint pupils- his baseline   Cardiovascular:     Rate and Rhythm: Normal rate and regular rhythm.     Pulses: Normal pulses.     Heart sounds: Normal heart sounds.  Musculoskeletal:     Right lower leg: Edema present.     Left lower leg: Edema present.  Skin:    General: Skin is warm and dry.     Capillary Refill: Capillary refill takes less than 2 seconds.          Comments: Lateral LLE- Two linear lacerations One 1cm, one 2 cm Erythema and mild warmth  noted. No drainage or streaking noted   Neurological:     Mental Status: He is alert and oriented to person, place,  and time.  Psychiatric:        Mood and Affect: Mood normal.        Behavior: Behavior normal.        Thought Content: Thought content normal.        Judgment: Judgment normal.       Assessment & Plan:   1. Wound of left lower extremity, initial encounter   2. Diabetes mellitus type 2 in obese ([place])   3. Tobacco abuse     Diabetes mellitus type 2 in obese [facility]) Lab Results  Component Value Date   HGBA1C 7.3 (H) 09/02/2019   HGBA1C 7.1 (A) 08/25/2019   HGBA1C 7.0 (H) 05/12/2019    Wound of left leg Since he has sporadically taken 1/2 rx of Cephalexin - will change ABX class Do not use any more of the Cephalexin. Please start Doxycyline 100mg  twice daily- take for 10 days. Ensure to complete the entire course of antibiotic. Remain well hydrated, follow diabetic diet. After 6 weeks on Nicoderm 21mg /day- please call clinic for refill of new 14mg /day rx. If symptoms persist after Doxycyline completed, please call clinic. Continue to social distance and wear a mask when in public.  Tobacco abuse Agreeable to Nicoderm patch! 21mg  x 6 weeks, please call when 14mg  rx needed    FOLLOW-UP:  Return if symptoms worsen or fail to improve.

## 2019-09-11 NOTE — Telephone Encounter (Signed)
Left a message for the patient to call back. He will need to get the prescription filled by his PCP

## 2019-09-11 NOTE — Assessment & Plan Note (Signed)
Agreeable to Nicoderm patch! 21mg  x 6 weeks, please call when 14mg  rx needed

## 2019-09-12 NOTE — Telephone Encounter (Addendum)
° °  Patient states he was called on 9/17 by the nurse. H e thinks it was [id number] Please return call to patient.

## 2019-09-12 NOTE — Telephone Encounter (Signed)
Spoke with patient. Explained that I see where Marcus Haw RN called him on 09/11/19 around 5:30pm but unsure what the message is referencing.   Will route message to her to follow up  Notified him that patient assistance app was faxed for eliquis on 9/11 and provided him phone # to call and check on status

## 2019-09-12 NOTE — Telephone Encounter (Signed)
The patient has been made aware and will contact his urologist or PCP for a refill.

## 2019-09-12 NOTE — Telephone Encounter (Signed)
The patient has been notified that his assistance has been denied. He has also been aware that we are currently out of samples.   He will call back next week.

## 2019-09-16 NOTE — Telephone Encounter (Signed)
Medication Samples have been provided to the patient.  Drug name: Eliquis       Strength: 5 mg        Qty: 2 boxes  LOT: [id number]  Exp.Date: 11/2021  The patient has been made aware.

## 2019-09-17 ENCOUNTER — Encounter: Payer: Self-pay | Admitting: Adult Health

## 2019-09-17 ENCOUNTER — Other Ambulatory Visit: Payer: Self-pay

## 2019-09-17 ENCOUNTER — Telehealth: Payer: Self-pay

## 2019-09-17 ENCOUNTER — Ambulatory Visit
Admission: RE | Admit: 2019-09-17 | Discharge: 2019-09-17 | Disposition: A | Discharge status: Home / Self Care | Payer: PPO | Source: Ambulatory Visit | Attending: Adult Health | Admitting: Adult Health

## 2019-09-17 DIAGNOSIS — R911 Solitary pulmonary nodule: Secondary | ICD-10-CM

## 2019-09-17 DIAGNOSIS — Z122 Encounter for screening for malignant neoplasm of respiratory organs: Secondary | ICD-10-CM

## 2019-09-17 DIAGNOSIS — F17210 Nicotine dependence, cigarettes, uncomplicated: Secondary | ICD-10-CM | POA: Diagnosis not present

## 2019-09-17 NOTE — Telephone Encounter (Signed)
Pt states that when he seen Dr. Sallyanne Kuster he states his battery did not have much longer so he unplugged his monitor. The pt states he will plug his monitor up to see what his battery life is then I will give him a call back.

## 2019-09-17 NOTE — Telephone Encounter (Signed)
Left message for patient to regarding disconnected monitor 

## 2019-09-22 NOTE — Telephone Encounter (Signed)
Pt states he is low on plugs where he is living and will send a transmission every 2 weeks. I told him that would be fine.

## 2019-09-22 NOTE — Telephone Encounter (Signed)
Transmission received. Battery still ok LMOVM

## 2019-09-24 ENCOUNTER — Ambulatory Visit (INDEPENDENT_AMBULATORY_CARE_PROVIDER_SITE_OTHER): Payer: PPO | Admitting: *Deleted

## 2019-09-24 ENCOUNTER — Other Ambulatory Visit: Payer: Self-pay | Admitting: Cardiovascular Disease

## 2019-09-24 DIAGNOSIS — I63411 Cerebral infarction due to embolism of right middle cerebral artery: Secondary | ICD-10-CM | POA: Diagnosis not present

## 2019-09-24 LAB — CUP PACEART REMOTE DEVICE CHECK
Date Time Interrogation Session: 20200930114508
Implantable Pulse Generator Implant Date: 20170821

## 2019-09-30 NOTE — Progress Notes (Signed)
Carelink Summary Report / Loop Recorder 

## 2019-10-01 ENCOUNTER — Telehealth: Payer: Self-pay

## 2019-10-01 NOTE — Telephone Encounter (Signed)
Patient aware of CT results.  He is still at a pack a day.  Suggested patient cut back on smokeing.  Education given.

## 2019-10-09 ENCOUNTER — Telehealth: Payer: Self-pay | Admitting: Cardiovascular Disease

## 2019-10-09 NOTE — Telephone Encounter (Signed)
Patient calling the office for samples of medication:   1.  What medication and dosage are you requesting samples for? Elquis 5 mg  2.  Are you currently out of this medication? yes

## 2019-10-10 NOTE — Telephone Encounter (Signed)
Informed pt samples are available for pick up at the front desk.  Eliquis 5 mg Qty: 2 boxes Lot # [id number] Exp: 12/22

## 2019-10-10 NOTE — Telephone Encounter (Signed)
Rerouted to nl triage °

## 2019-10-10 NOTE — Telephone Encounter (Signed)
Currently not in office- can I have someone check to see if we have samples? Thank you!

## 2019-10-17 IMAGING — MR MR ABDOMEN WO/W CM
17 series · 48 of 48 positions shown · IV contrast (multihance)
Comparison: CT abdomen pelvis 02/21/2017; CT abdomen pelvis
12/01/2013

CLINICAL DATA: Patient with hematuria. Indeterminate lesion
inferior pole left kidney.

EXAM:
MRI ABDOMEN WITHOUT AND WITH CONTRAST
TECHNIQUE: Multiplanar multisequence MR imaging of the abdomen was performed
both before and after the administration of intravenous contrast.
CONTRAST:  20mL MULTIHANCE GADOBENATE DIMEGLUMINE 529 MG/ML IV SOLN

[Series 3: T2 · coronal · 5.0mm · 1.48mm/px · 2 of 36 slices shown (1 of 3)]
[im 1/36]
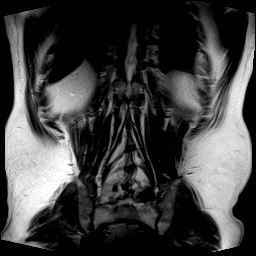
[im 36/36]
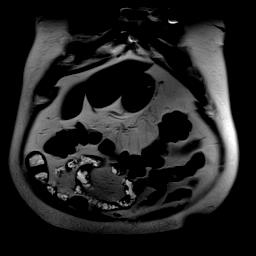

[Series 4: T1 · axial · 3.0mm · 1.19mm/px · z∈[-122,+55]mm · 5 of 120 slices shown]
[im 1/120]
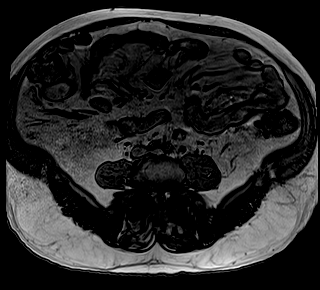
[im 30/120]
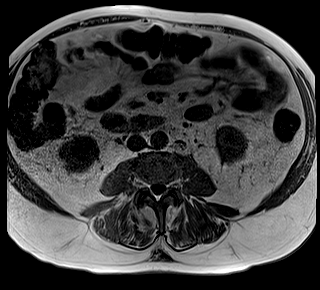
[im 60/120]
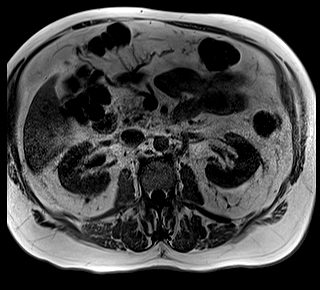
[im 90/120]
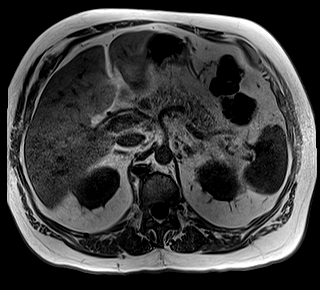
[im 120/120]
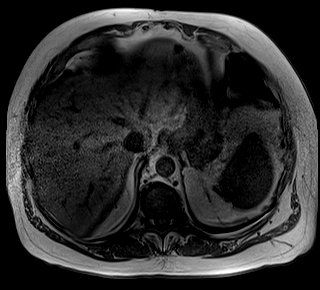

[Series 5: T2 · axial · 5.0mm · 1.48mm/px · z∈[-121,+101]mm · 2 of 38 slices shown (2 of 3)]
[im 1/38]
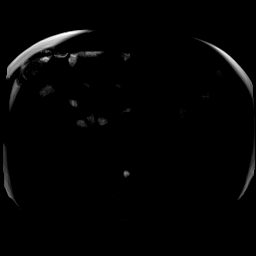
[im 38/38]
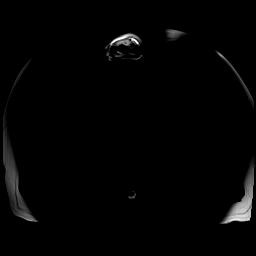

[Series 6: DWI · axial · 5.0mm · 1.42mm/px · z∈[-121,+101]mm · 5 of 114 slices shown (1 of 2)]
[im 1/114]
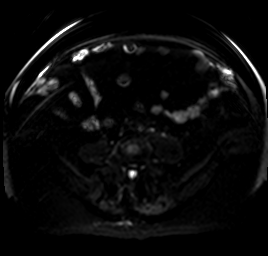
[im 29/114]
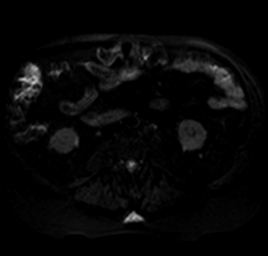
[im 57/114]
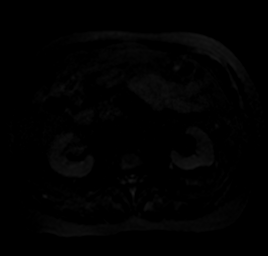
[im 85/114]
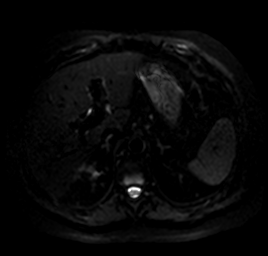
[im 114/114]
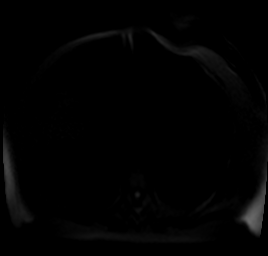

[Series 7: DWI · axial · 5.0mm · 1.42mm/px · z∈[-121,+101]mm · 2 of 38 slices shown (2 of 2)]
[im 1/38]
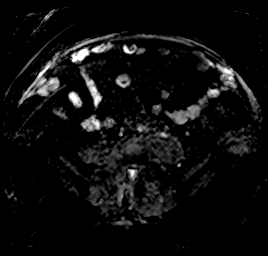
[im 38/38]
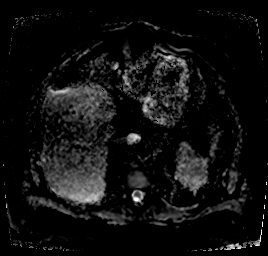

[Series 8: T2 · axial · 5.0mm · 1.22mm/px · 1 of 29 slices shown (3 of 3)]
[im 1/29]
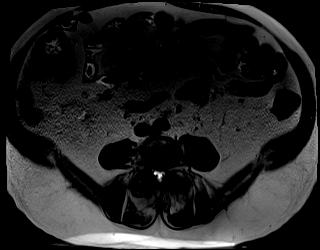

[Series 9: bSSFP · axial · 5.0mm · 1.25mm/px · 1 of 35 slices shown]
[im 1/35]
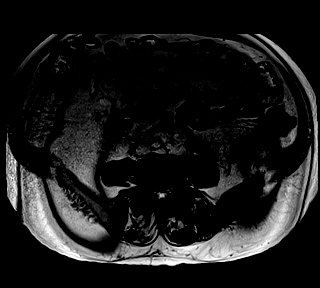

[Series 10: T1 dynamic · axial · non-contrast · 3.0mm · 1.25mm/px · z∈[-129,+84]mm · 3 of 72 slices shown]
[im 1/72]
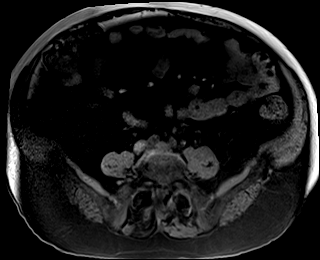
[im 36/72]
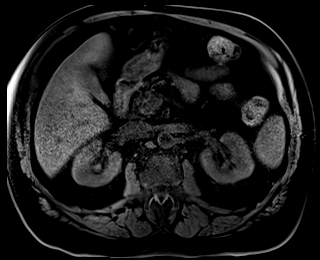
[im 72/72]
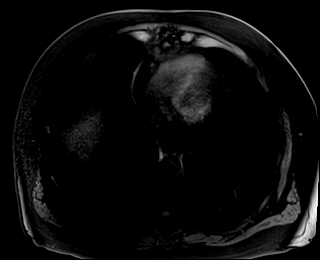

[Series 11: T1 dynamic post-contrast · axial · 3.0mm · 1.25mm/px · z∈[-129,+84]mm · 3 of 72 slices shown (1 of 9)]
[im 1/72]
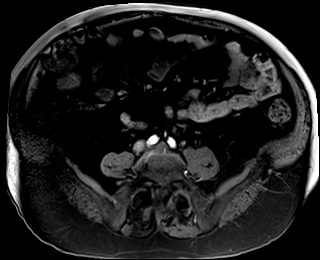
[im 36/72]
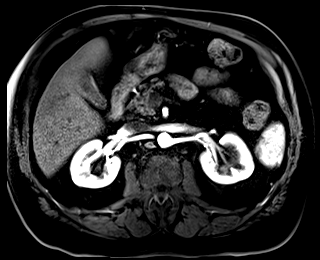
[im 72/72]
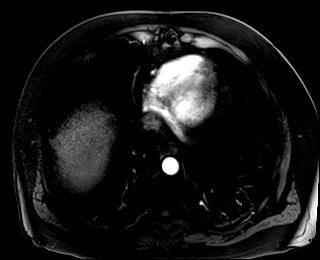

[Series 12: T1 dynamic post-contrast · axial · 3.0mm · 1.25mm/px · z∈[-129,+84]mm · 3 of 72 slices shown (2 of 9)]
[im 1/72]
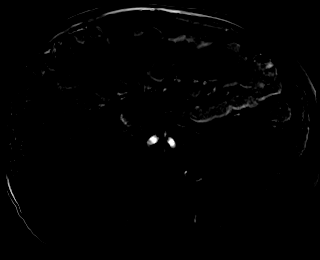
[im 36/72]
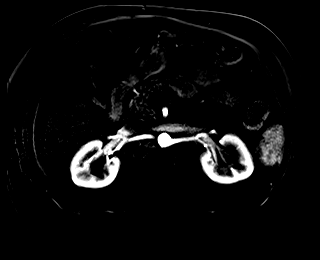
[im 72/72]
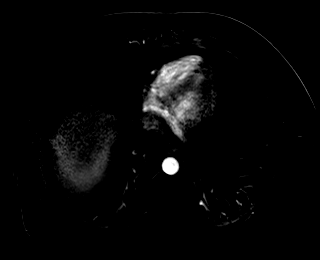

[Series 13: T1 dynamic post-contrast · axial · 3.0mm · 1.25mm/px · z∈[-129,+84]mm · 3 of 72 slices shown (3 of 9)]
[im 1/72]
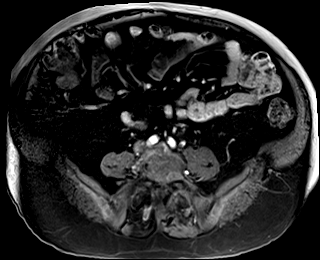
[im 36/72]
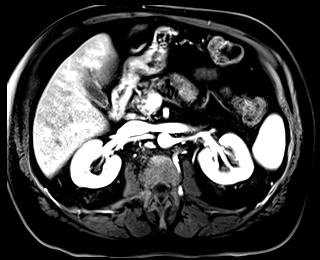
[im 72/72]
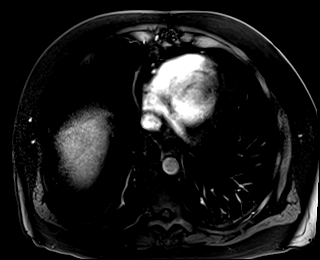

[Series 14: T1 dynamic post-contrast · axial · 3.0mm · 1.25mm/px · z∈[-129,+84]mm · 3 of 72 slices shown (4 of 9)]
[im 1/72]
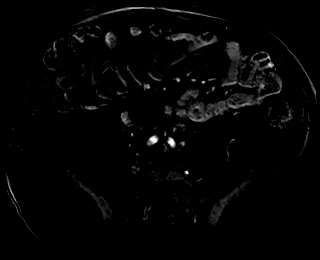
[im 36/72]
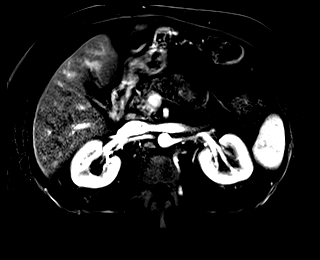
[im 72/72]
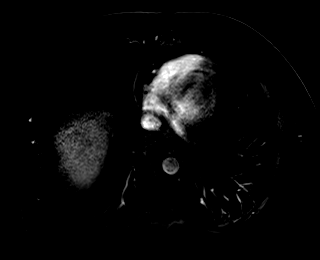

[Series 15: T1 dynamic post-contrast · axial · 3.0mm · 1.25mm/px · z∈[-129,+84]mm · 3 of 72 slices shown (5 of 9)]
[im 1/72]
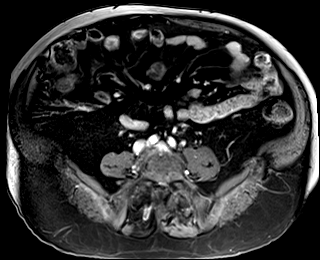
[im 36/72]
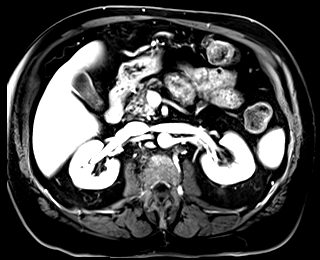
[im 72/72]
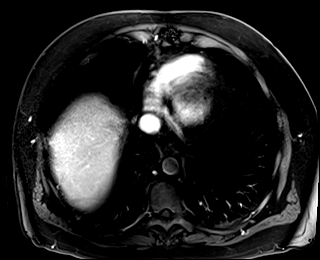

[Series 16: T1 dynamic post-contrast · axial · 3.0mm · 1.25mm/px · z∈[-129,+84]mm · 3 of 72 slices shown (6 of 9)]
[im 1/72]
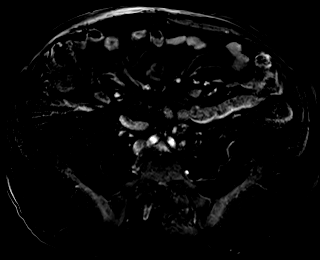
[im 36/72]
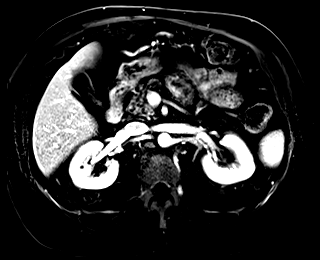
[im 72/72]
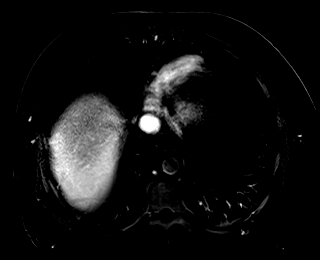

[Series 17: T1 dynamic post-contrast · coronal · 3.0mm · 1.25mm/px · 3 of 80 slices shown (7 of 9)]
[im 1/80]
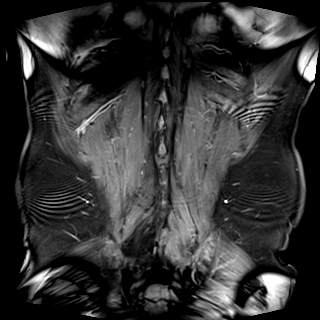
[im 40/80]
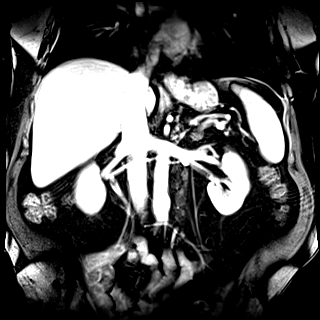
[im 80/80]
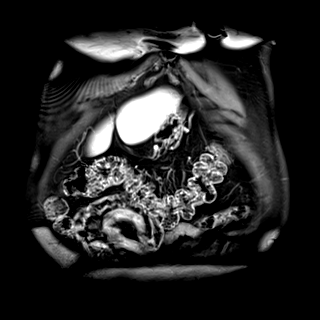

[Series 18: T1 dynamic post-contrast · axial · 3.0mm · 1.25mm/px · z∈[-129,+84]mm · 3 of 72 slices shown (8 of 9)]
[im 1/72]
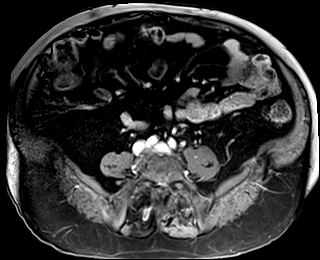
[im 36/72]
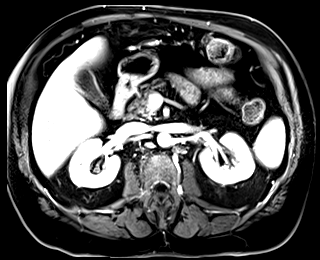
[im 72/72]
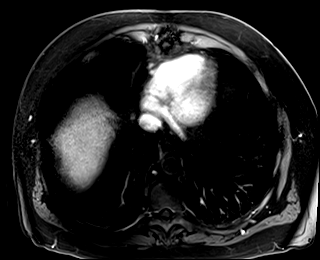

[Series 19: T1 dynamic post-contrast · axial · 3.0mm · 1.25mm/px · z∈[-129,+84]mm · 3 of 72 slices shown (9 of 9)]
[im 1/72]
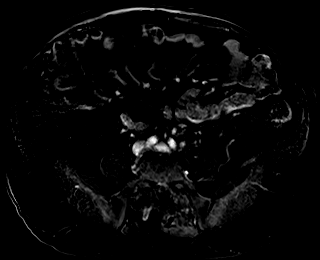
[im 36/72]
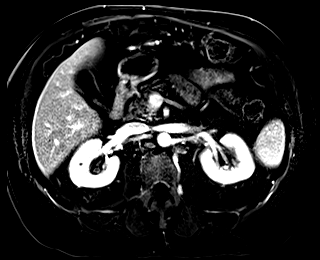
[im 72/72]
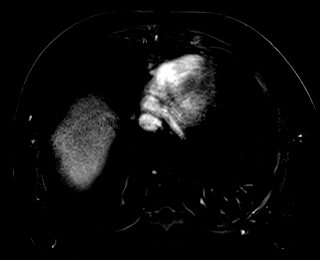

[48 of 48 positions shown; findings below may reference images not displayed]

FINDINGS: Lower chest: Unremarkable.

Hepatobiliary: Liver is normal in size and contour. No focal hepatic
lesion is identified. Gallbladder is unremarkable. No intrahepatic
or extrahepatic biliary ductal dilatation.

Pancreas:  Mild fatty atrophy.

Spleen:  Unremarkable

Adrenals/Urinary Tract: The adrenal glands are normal. Kidneys
enhance symmetrically with contrast. Filling defect within the right
renal collecting system compatible with stone. Bilateral
subcentimeter too small to characterize lesions are demonstrated,
favored to represent small cysts.

Additionally, within the inferior pole of the left kidney there is
an 11 mm (image 55; series 10) intrinsically T1 bright lesion
without definitive contrast enhancement (image 55; series 19),
favored to represent a hemorrhagic/proteinaceous cyst.

There is new smooth enhancement of the right renal collecting system
urothelium.

Stomach/Bowel: Small hiatal hernia. Normal morphology of the
stomach. Visualized large and small bowel is unremarkable. No
evidence for obstruction.

Vascular/Lymphatic: Infrarenal abdominal aortic ectasia measuring
2.4 cm. No retroperitoneal adenopathy. Stable prominent and mildly
enlarged upper abdominal adenopathy including a 1.3 cm node (image
9; series 8) and a 1.1 cm node (image 11; series 8).

Other:  None.

Musculoskeletal: No aggressive or acute appearing osseous lesions.
IMPRESSION: 1. Lesion within the inferior pole of the left kidney compatible
with benign hemorrhagic/proteinaceous cyst (Bosniak II).
2. Stone within the right renal collecting system. Additionally,
there is indeterminate urothelial enhancement involving the right
renal collecting system which may be infectious/inflammatory in
etiology. Recommend attention on follow-up imaging.

## 2019-10-20 ENCOUNTER — Other Ambulatory Visit: Payer: Self-pay | Admitting: Adult Health

## 2019-10-27 ENCOUNTER — Telehealth: Payer: Self-pay | Admitting: Cardiovascular Disease

## 2019-10-27 LAB — CUP PACEART REMOTE DEVICE CHECK
Date Time Interrogation Session: 20201102092620
Implantable Pulse Generator Implant Date: 20170821

## 2019-10-27 NOTE — Telephone Encounter (Signed)
Patient calling the office for samples of medication:   1.  What medication and dosage are you requesting samples for? Eliquis  2.  Are you currently out of this medication? yes    

## 2019-10-27 NOTE — Telephone Encounter (Signed)
Called pt to let him know we had one Eliquis sample left. Pt verbalized understanding.

## 2019-10-29 ENCOUNTER — Ambulatory Visit (INDEPENDENT_AMBULATORY_CARE_PROVIDER_SITE_OTHER): Payer: PPO | Admitting: *Deleted

## 2019-10-29 DIAGNOSIS — I63411 Cerebral infarction due to embolism of right middle cerebral artery: Secondary | ICD-10-CM | POA: Diagnosis not present

## 2019-10-31 ENCOUNTER — Other Ambulatory Visit: Payer: Self-pay | Admitting: Adult Health

## 2019-11-03 ENCOUNTER — Other Ambulatory Visit: Payer: Self-pay | Admitting: Adult Health

## 2019-11-04 ENCOUNTER — Telehealth: Payer: Self-pay | Admitting: Cardiovascular Disease

## 2019-11-04 NOTE — Telephone Encounter (Signed)
No on site nurses today-  Will route to PharmD to see if any available? Thank you!

## 2019-11-04 NOTE — Telephone Encounter (Signed)
Patient calling the office for samples of medication: ° ° °1.  What medication and dosage are you requesting samples for? ° °apixaban (ELIQUIS) 5 MG TABS tablet ° °2.  Are you currently out of this medication?  yes ° ° °

## 2019-11-04 NOTE — Telephone Encounter (Signed)
Lattie Haw - could you please find out what his status is?  I will leave 2 weeks samples at the front desk  Lot [id number] Exp 8/22ri

## 2019-11-05 NOTE — Telephone Encounter (Signed)
Pt.notified

## 2019-11-05 NOTE — Telephone Encounter (Signed)
Follow up    Pt is returning call to follow up on the samples    Please call

## 2019-11-07 MED ORDER — APIXABAN 5 MG PO TABS: ORAL_TABLET | ORAL | 0 refills | Status: DC

## 2019-11-07 MED FILL — ELIQUIS 5 MG TABLET: 5 | 90 days supply | Qty: 180 | Fill #0

## 2019-11-07 NOTE — Addendum Note (Signed)
Addended by: Ricci Barker on: 11/07/2019 01:28 PM   Modules accepted: Orders

## 2019-11-07 NOTE — Telephone Encounter (Signed)
Spoke with the patient about his Eliquis. He did state that he is in the doughnut hole right now. Per his request, a 3 month supply has been sent to [facility] outpatient pharmacy.  He did state that he may be interested in switching to Warfarin for the cost benefits. He will talk to his daughter and think about it.

## 2019-11-10 DIAGNOSIS — H2513 Age-related nuclear cataract, bilateral: Secondary | ICD-10-CM | POA: Diagnosis not present

## 2019-11-11 ENCOUNTER — Other Ambulatory Visit: Payer: Self-pay

## 2019-11-11 MED ORDER — METFORMIN HCL ER 500 MG PO TB24: ORAL_TABLET | ORAL | 0 refills | Status: DC

## 2019-11-11 NOTE — Telephone Encounter (Signed)
Received fax from pharmacy that pt is requesting 90 day supply of metformin.  RX sent to pharmacy.  Charyl Bigger, CMA

## 2019-11-16 NOTE — Progress Notes (Signed)
Carelink Summary Report / Loop Recorder 

## 2019-11-25 ENCOUNTER — Other Ambulatory Visit: Payer: Self-pay

## 2019-11-25 ENCOUNTER — Other Ambulatory Visit: Payer: PPO

## 2019-11-25 DIAGNOSIS — E669 Obesity, unspecified: Secondary | ICD-10-CM | POA: Diagnosis not present

## 2019-11-25 DIAGNOSIS — E1169 Type 2 diabetes mellitus with other specified complication: Secondary | ICD-10-CM

## 2019-11-26 ENCOUNTER — Other Ambulatory Visit: Payer: Self-pay | Admitting: Adult Health

## 2019-11-26 ENCOUNTER — Telehealth: Payer: Self-pay | Admitting: Cardiovascular Disease

## 2019-11-26 DIAGNOSIS — E669 Obesity, unspecified: Secondary | ICD-10-CM

## 2019-11-26 DIAGNOSIS — E1169 Type 2 diabetes mellitus with other specified complication: Secondary | ICD-10-CM

## 2019-11-26 LAB — HEMOGLOBIN A1C
Est. average glucose Bld gHb Est-mCnc: 163 mg/dL
Hgb A1c MFr Bld: 7.3 % — ABNORMAL HIGH (ref 4.8–5.6)

## 2019-11-26 NOTE — Telephone Encounter (Signed)
Patient calling the office for samples of medication:   1.  What medication and dosage are you requesting samples for? apixaban (ELIQUIS) 5 MG TABS tablet  2.  Are you currently out of this medication? Yes    

## 2019-11-26 NOTE — Telephone Encounter (Signed)
Spoke with pt regarding request for Eliquis 5 mg samples. Pt called into the office on 11/10 requesting samples of med d/t being in the 'doughnut hole'. Pt was considering switching to Coumadin but wanted to discuss this with his daughter. Informed pt that 1 week sample supply of Eliquis 5mg  available for pick up at front desk. Inquired if pt had discussed switching to Coumadin with his daughter. Pt states he did and his daughter advised him to remain on Eliquis so he will do so. Advised pt to pick up sample today or sometime this week if able. Pt agreeable   Medication Samples available at front desk for pick up  Drug name: ELIQUIS       Strength: 5 MG        Qty: 1 BOX  LOT: [id number]  Exp.Date: 12/2021   Annita Brod 8:58 AM 11/26/2019

## 2019-11-26 NOTE — Progress Notes (Signed)
Virtual Visit via Telephone Note  I connected with [name] on 11/27/2019 at  3:15 PM EST by telephone and verified that I am speaking with the correct person using two identifiers.  Location: Patient: Home Provider: In Clinic   I discussed the limitations, risks, security and privacy concerns of performing an evaluation and management service by telephone and the availability of in person appointments. I also discussed with the patient that there may be a patient responsible charge related to this service. The patient expressed understanding and agreed to proceed.   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 Agreeable to reducing Wellbutrin XL 150mg  to QD from BID He will continue to reduce tobacco use He denies CP/chest tightness with exertion He denies palpitations  He needs LDCT repeat Needs Lipids, CMP    11/27/2019 OV: [name] calls in for regular f/u: HTN, T2D, Tobacco Use Disorder  AM BG 120-140s, he denies episodes hypoglycemia He is on Metformin XR 500mg - 2 tablets BID  Ambulatory BP SBP 110-120s DBP 70s HR 80-90s He denies acute cardiac sx's Followed by cardiology/Cr. Croitoru  He was started on Wellbutrin XL 150mg  QD- instructed to increase to BID Previously smoking 30 cigarettes per day, now he estimates to smoke 18 cigarettes/day- GREAT!  R sciatica pain- decreasing with regular chiropractor care  09/17/2019 CT Chest   IMPRESSION: Lung-RADS 2, benign appearance or behavior. Continue annual screening with low-dose chest CT without contrast in 12 months.  Aortic Atherosclerosis (ICD10-I70.0) and Emphysema (ICD10-J43.9).  He requests refill on Silodosin 4mg - discussed that he is on Tamsulosin- both Alpha 1 Blockers If sx's are not well controlled on Tamsulosin-then advised to f/u with his Urologist/Dr. Diona Fanti  Lab Results  Component Value Date   HGBA1C 7.3 (H) 11/25/2019   HGBA1C 7.3 (H) 09/02/2019   HGBA1C 7.1 (A) 08/25/2019   Patient Care Team    Relationship Specialty Notifications Start End  Esaw Grandchild, NP PCP - General Family Medicine  11/27/17   Sanda Klein, MD PCP - Cardiology Cardiology Admissions 01/16/18     Patient Active Problem List   Diagnosis Date Noted  . Wound of left leg 09/11/2019  . Tick bite of right lower leg 04/21/2019  . Dyslipidemia (high LDL; low HDL) 01/28/2019  . PAD (peripheral artery disease) ([place]) 01/28/2019  . Great toe pain, left 10/21/2018  . Encounter for Medicare annual wellness exam 06/20/2018  . Laceration of left lower leg 04/17/2018  . Laceration of skin of left lower leg 04/01/2018  . Increased urinary frequency 04/01/2018  . Hematuria 01/30/2018  . Puncture wound of toe of left foot 01/07/2018  . At risk for diabetic foot ulcer 01/07/2018  . Need for pneumococcal vaccination 11/27/2017  . Screening for colon cancer 11/27/2017  . Healthcare maintenance 11/27/2017  . Paroxysmal atrial fibrillation ([place]) 11/22/2016  . History of embolic stroke [id number]  . Diabetes mellitus type 2 in obese ([place]) 11/22/2016  . Venous insufficiency of  both lower extremities 09/21/2016  . Stroke ([place]) 08/12/2016  . Hyperglycemia   . Essential hypertension   . Vision, loss, sudden   . CVA (cerebral infarction) 08/11/2016  . CAD - inferior MI 2009, CABG 2009, patent grafts cath 04/2012 09/23/2013  . Moderate obesity 09/23/2013  . Hyperlipidemia 09/23/2013   . Tobacco abuse 09/23/2013  . Prostate cancer ([place]) 08/18/2011  . Myocardial infarction [facility])      Past Medical History:  Diagnosis Date  . Abdominal aortic aneurysm (AAA) ([place])    3 cm per 07-02-18 US abdominal aorta US epic  . Arthritis   . Blood clot in vein    behind left knee  . Chronic kidney disease    renal calculi  . Chronic venous insufficiency LOWER EXTREMITIES  . Coronary artery disease CARDIOLOGIST - DR  [id number]  LAST 1 WK AGO -- WILL REQUEST NOTE AND STRESS TEST  . Diabetes mellitus without complication ([place])    type 2  . Hematuria    last year  . Hyperglycemia   . Hyperlipidemia   . Hypertension   . Mixed dyslipidemia   . Myocardial infarction ([place])   . Neuropathy    toes only  . Nocturia   . Prostate cancer ([place]) 08/18/11   gleason 8, volume 24.4cc  . S/P CABG x 4   . ST elevation MI (STEMI) ([place]) 02-10-2008   S/P CABG  . Stroke [facility]) 2016   occ trouble wriing with right hand  . Urinary hesitancy   . Vision abnormalities    resolved now  . Wound discharge    since jan 2019 goes to wound center [place] left great toe small open area changes dressing q 2 days with ointment provided by wound center, clear drainage occ     Past Surgical History:  Procedure Laterality Date  . CARDIAC CATHETERIZATION  05/01/2012   grafts widely patent  . CARDIOVASCULAR STRESS TEST  03-15-2010   INFERIOR WALL SCAR WITHOUT ANY MEANINGFUL ISCHEMIA/ EF 46% / LOW RISK SCAN  . CORONARY ARTERY BYPASS GRAFT  02-10-2008  DR Einar Gip   X4 VESSEL DISEASE / [facility] CABG  . CYSTOSCOPY  02/08/2012   Procedure: CYSTOSCOPY FLEXIBLE;  Surgeon: Franchot Gallo, MD;  Location: [facility];  Service: Urology;;  . [place], URETEROSCOPY AND STENT PLACEMENT Right 09/12/2018   Procedure: CYSTOSCOPY WITH RETROGRADE PYELOGRAM, URETEROSCOPY AND STENT PLACEMENT, STONE EXTRACTION;  Surgeon: Franchot Gallo, MD;  Location: [facility];  Service: Urology;   Laterality: Right;  . EP IMPLANTABLE DEVICE N/A 08/14/2016   Procedure: Loop Recorder Insertion;  Surgeon: Evans Lance, MD;  Location: [place] CV LAB;  Service: Cardiovascular;  Laterality: N/A;  . EXTRACORPOREAL SHOCK WAVE LITHOTRIPSY  2013  . HOLMIUM LASER APPLICATION Right [id number]   Procedure: HOLMIUM LASER APPLICATION;  Surgeon: Franchot Gallo, MD;  Location: [facility];  Service: Urology;  Laterality: Right;  . KNEE SURGERY  age 37   RIGHT  . LEFT HEART CATHETERIZATION WITH CORONARY/GRAFT ANGIOGRAM N/A 05/01/2012   Procedure: LEFT HEART CATHETERIZATION WITH Beatrix Fetters;  Surgeon: Sanda Klein, MD;  Location: [place] CATH LAB;  Service: Cardiovascular;  Laterality: N/A;  . MULTIPLE TEETH EXTRACTIONS (24)/ FOUR QUADRANT ALVEOLPLASTY/ MANDIBULAR LATERAL EXOSTOSES REDUCTIONS  03-24-2010   CHRONIC PERIODONTITIS  . RADIOACTIVE SEED IMPLANT  02/08/2012   Procedure: RADIOACTIVE SEED IMPLANT;  Surgeon: Franchot Gallo, MD;  Location: [facility];  Service: Urology;  Laterality: N/A;  C-ARM   . SHOULDER SURGERY  1982   LEFT  . TEE WITHOUT CARDIOVERSION N/A 08/14/2016   Procedure: TRANSESOPHAGEAL ECHOCARDIOGRAM (TEE);  Surgeon: Josue Hector, MD;  Location: [facility] ENDOSCOPY;  Service: Cardiovascular;  Laterality: N/A;  . URETERAL STENT PLACEMENT       Family History  Problem Relation Age of Onset  . Cancer Father        pancreatic  . Diabetes Father      Social History   Substance and Sexual Activity  Drug Use No     Social History   Substance and Sexual Activity  Alcohol Use Not Currently  . Alcohol/week: 2.0 standard drinks  . Types: 1 Cans of beer, 1 Shots of liquor per week   Comment: none in 3 years     Social History   Tobacco Use  Smoking Status Current Every Day Smoker  . Packs/day: 1.00  . Years: 50.00  . Pack years: 50.00  . Types: Cigarettes  Smokeless Tobacco Never Used  Tobacco Comment   PREVIOUSLY SMOKED 3PPD / DECREASED TO  1PPD SINCE 2009     Outpatient Encounter Medications as of 11/27/2019  Medication Sig Note  . apixaban (ELIQUIS) 5 MG TABS tablet Take 1 tablet by mouth twice daily.   Marland Kitchen atorvastatin (LIPITOR) 10 MG tablet Take 1 tablet (10 mg total) by mouth daily.   Marland Kitchen buPROPion (WELLBUTRIN XL) 150 MG 24 hr tablet TAKE 1 TABLET BY MOUTH TWICE DAILY   . Continuous Blood Gluc Receiver (FREESTYLE LIBRE 14 DAY READER) DEVI 1 Device by Does not apply route daily. Use to check blood sugars every morning and 2 hours after largest meals   . Continuous Blood Gluc Sensor (FREESTYLE LIBRE 14 DAY SENSOR) MISC USE TO CHECK BLOOD SUGARS IN THE MORNING FASTING AND  2  HOURS  AFTER  LARGEST  MEAL   . finasteride (PROSCAR) 5 MG tablet Take 5 mg by mouth daily.   Marland Kitchen gabapentin (NEURONTIN) 300 MG capsule TAKE 1 CAPSULE BY MOUTH IN THE MORNING AND 2 CAPSULES AT DINNER TIME   . glucose blood (FREESTYLE LITE) test strip Use to check blood sugars every morning fasting   . lisinopril (ZESTRIL) 5 MG tablet Take 1/2 (one-half) tablet by mouth once daily   . metFORMIN (GLUCOPHAGE-XR) 500 MG 24 hr tablet Take 4 tablets daily with breakfast. (Patient taking differently: Take 2,000 mg by mouth 2 (two) times daily. Take 4 tablets daily with breakfast.)   . Multiple Vitamin (MULTIVITAMIN WITH MINERALS) TABS tablet Take 1 tablet by mouth daily. Men's One-A-Day Multivitamin   . oxybutynin (DITROPAN) 5 MG tablet Take 1 tablet (5 mg total) by mouth every 8 (eight) hours as needed for bladder spasms.   . tamsulosin (FLOMAX) 0.4 MG CAPS capsule Take 1 capsule by mouth at bedtime   . [DISCONTINUED] buPROPion (WELLBUTRIN XL) 150 MG 24 hr tablet TAKE 1 TABLET BY MOUTH TWICE DAILY(MUST HAVE OFFICE VISIT PRIOR TO ANY FURTHER REFILLS0 (Patient taking differently: Take 150 mg by mouth daily. TAKE 1 TABLET BY MOUTH TWICE DAILY(MUST HAVE OFFICE VISIT PRIOR TO ANY FURTHER REFILLS0)   . [DISCONTINUED] Continuous Blood Gluc Sensor (FREESTYLE LIBRE 14 DAY SENSOR)  MISC USE TO CHECK BLOOD SUGARS IN THE MORNING FASTING AND  2  HOURS  AFTER  LARGEST  MEAL   . [DISCONTINUED] doxycycline (VIBRA-TABS) 100 MG tablet Take 1 tablet (100 mg total) by mouth 2 (two) times daily.   . [DISCONTINUED] nicotine (NICODERM CQ - DOSED IN MG/24 HOURS) 21 mg/24hr patch Place 1  patch (21 mg total) onto the skin daily.   . [DISCONTINUED] sildenafil (VIAGRA) 100 MG tablet Take 100 mg by mouth daily as needed for erectile dysfunction. 09/02/2018: On hold due to upcoming procedure.  . [DISCONTINUED] silodosin (RAPAFLO) 4 MG CAPS capsule Take 1 capsule by mouth once daily (Patient not taking: Reported on 11/27/2019)    No facility-administered encounter medications on file as of 11/27/2019.     Allergies: Bee venom, Oysters [shellfish allergy], and Penicillins  Body mass index is 33.66 kg/m.  Blood pressure 115/72, pulse 72, temperature 97.8 F (36.6 C), temperature source Oral, height 5' 8.25" (1.734 m), weight 223 lb (101.2 kg). Review of Systems: General:   Denies fever, chills, unexplained weight loss.  Optho/Auditory:   Denies visual changes, blurred vision/LOV Respiratory:   Denies SOB, DOE more than baseline levels.  Cardiovascular:   Denies chest pain, palpitations, new onset peripheral edema  Gastrointestinal:   Denies nausea, vomiting, diarrhea.  Genitourinary: Denies dysuria, , flank pain or discharge from genitals.  freq/ urgency + Endocrine:     Denies hot or cold intolerance, polyuria, polydipsia. Musculoskeletal:   Denies unexplained myalgias, joint swelling, unexplained arthralgias, gait problems.  Skin:  Denies rash, suspicious lesions Neurological:     Denies dizziness, unexplained weakness, numbness  Psychiatric/Behavioral:   Denies mood changes, suicidal or homicidal ideations, hallucinations  Observations/Objective: No acute distress noted during telephone conversation.  Assessment and Plan: increase Wellbutrin Xl 150mg  to BID Continue all  medications as directed F/u with Urologist- since urinary sx's not well controlled with tamsulosin 0.4mg  QD Continue to reduce to stop tobacco use- you can do it! Remain well hydrated, follow Diabetic diet. Remain as active as possible. Continue follow-up with Cards as directed. LDCT repeat 08/2020 Continue to social distance and wear a mask when in public.  Follow Up Instructions: 3 months OV with fasting labs plus mg++   I discussed the assessment and treatment plan with the patient. The patient was provided an opportunity to ask questions and all were answered. The patient agreed with the plan and demonstrated an understanding of the instructions.   The patient was advised to call back or seek an in-person evaluation if the symptoms worsen or if the condition fails to improve as anticipated.  I provided 18 minutes of non-face-to-face time during this encounter.   Esaw Grandchild, NP

## 2019-11-27 ENCOUNTER — Encounter: Payer: Self-pay | Admitting: Adult Health

## 2019-11-27 ENCOUNTER — Ambulatory Visit (INDEPENDENT_AMBULATORY_CARE_PROVIDER_SITE_OTHER): Payer: PPO | Admitting: Adult Health

## 2019-11-27 VITALS — BP 115/72 | HR 72 | Temp 97.8°F | Ht 68.25 in | Wt 223.0 lb

## 2019-11-27 DIAGNOSIS — I480 Paroxysmal atrial fibrillation: Secondary | ICD-10-CM

## 2019-11-27 DIAGNOSIS — Z0000 Encounter for general adult medical examination without abnormal findings: Secondary | ICD-10-CM

## 2019-11-27 DIAGNOSIS — E785 Hyperlipidemia, unspecified: Secondary | ICD-10-CM

## 2019-11-27 DIAGNOSIS — Z720 Tobacco use: Secondary | ICD-10-CM

## 2019-11-27 DIAGNOSIS — E669 Obesity, unspecified: Secondary | ICD-10-CM | POA: Diagnosis not present

## 2019-11-27 DIAGNOSIS — E1169 Type 2 diabetes mellitus with other specified complication: Secondary | ICD-10-CM

## 2019-11-27 DIAGNOSIS — Z79899 Other long term (current) drug therapy: Secondary | ICD-10-CM

## 2019-11-27 MED ORDER — BUPROPION HCL ER (XL) 150 MG PO TB24: ORAL_TABLET | ORAL | 0 refills | Status: DC

## 2019-11-27 NOTE — Addendum Note (Signed)
Addended by: Fonnie Mu on: 11/27/2019 04:04 PM   Modules accepted: Orders

## 2019-11-27 NOTE — Assessment & Plan Note (Signed)
Assessment and Plan: increase Wellbutrin Xl 150mg  to BID Continue all medications as directed F/u with Urologist- since urinary sx's not well controlled with tamsulosin 0.4mg  QD Continue to reduce to stop tobacco use- you can do it! Remain well hydrated, follow Diabetic diet. Remain as active as possible. Continue follow-up with Cards as directed. LDCT repeat 08/2020 Continue to social distance and wear a mask when in public.  Follow Up Instructions: 3 months OV with fasting labs plus mg++   I discussed the assessment and treatment plan with the patient. The patient was provided an opportunity to ask questions and all were answered. The patient agreed with the plan and demonstrated an understanding of the instructions.   The patient was advised to call back or seek an in-person evaluation if the symptoms worsen or if the condition fails to improve as anticipated.

## 2019-11-27 NOTE — Assessment & Plan Note (Signed)
Atorvastatin 10mg  QD

## 2019-11-27 NOTE — Assessment & Plan Note (Signed)
Eliquis 5mg  QD

## 2019-11-27 NOTE — Assessment & Plan Note (Signed)
Lab Results  Component Value Date   HGBA1C 7.3 (H) 11/25/2019   HGBA1C 7.3 (H) 09/02/2019   HGBA1C 7.1 (A) 08/25/2019  AM BG 120-140s, he denies episodes hypoglycemia He is on Metformin XR 500mg - 2 tablets BID

## 2019-11-27 NOTE — Assessment & Plan Note (Signed)
>>  ASSESSMENT AND PLAN FOR HYPERLIPIDEMIA WRITTEN ON 11/27/2019  3:58 PM BY DANFORD, KATY D, NP  Atorvastatin 10mg  QD

## 2019-11-27 NOTE — Assessment & Plan Note (Signed)
He was started on Wellbutrin XL 150mg  QD- instructed to increase to BID Previously smoking 30 cigarettes per day, now he estimates to smoke 18 cigarettes/day- GREAT!

## 2019-12-01 ENCOUNTER — Ambulatory Visit (INDEPENDENT_AMBULATORY_CARE_PROVIDER_SITE_OTHER): Payer: PPO | Admitting: *Deleted

## 2019-12-01 DIAGNOSIS — I639 Cerebral infarction, unspecified: Secondary | ICD-10-CM

## 2019-12-01 LAB — CUP PACEART REMOTE DEVICE CHECK
Date Time Interrogation Session: 20201207093337
Implantable Pulse Generator Implant Date: 20170821

## 2019-12-08 ENCOUNTER — Telehealth: Payer: Self-pay | Admitting: Cardiovascular Disease

## 2019-12-08 NOTE — Telephone Encounter (Signed)
Pt given #28 samples of Eliquis 5 mg.

## 2019-12-08 NOTE — Telephone Encounter (Signed)
     Patient calling the office for samples of medication:   1.  What medication and dosage are you requesting samples for? eliquis  2.  Are you currently out of this medication? yes

## 2019-12-17 ENCOUNTER — Telehealth: Payer: Self-pay | Admitting: *Deleted

## 2019-12-17 ENCOUNTER — Other Ambulatory Visit: Payer: Self-pay | Admitting: Adult Health

## 2019-12-17 NOTE — Telephone Encounter (Signed)
Medication Samples have been provided to the patient.  Drug name: Eliquis       Strength:  5mg         Qty: 1 box LOT: [id number]  Exp.Date: 11/2021

## 2019-12-28 NOTE — Progress Notes (Signed)
ILR remote 

## 2020-01-02 ENCOUNTER — Ambulatory Visit (INDEPENDENT_AMBULATORY_CARE_PROVIDER_SITE_OTHER): Payer: PPO | Admitting: *Deleted

## 2020-01-02 DIAGNOSIS — I639 Cerebral infarction, unspecified: Secondary | ICD-10-CM | POA: Diagnosis not present

## 2020-01-04 LAB — CUP PACEART REMOTE DEVICE CHECK
Date Time Interrogation Session: 20210109093504
Implantable Pulse Generator Implant Date: 20170821

## 2020-01-14 ENCOUNTER — Ambulatory Visit: Payer: PPO | Attending: Internal Medicine

## 2020-01-14 DIAGNOSIS — Z23 Encounter for immunization: Secondary | ICD-10-CM | POA: Insufficient documentation

## 2020-01-14 NOTE — Progress Notes (Signed)
   Covid-19 Vaccination Clinic  Name:  [name]    MRN: [id number] DOB: [birth year]  01/14/2020  [name] was observed post Covid-19 immunization for 15 minutes without incidence. He was provided with Vaccine Information Sheet and instruction to access the V-Safe system.   [name] was instructed to call 911 with any severe reactions post vaccine: Marland Kitchen Difficulty breathing  . Swelling of your face and throat  . A fast heartbeat  . A bad rash all over your body  . Dizziness and weakness    Immunizations Administered    Name Date Dose VIS Date Route   Pfizer COVID-19 Vaccine 01/14/2020  9:41 AM 0.3 mL 12/05/2019 Intramuscular   Manufacturer: [place]   Lot: [id number]   [place]: [id number]

## 2020-01-15 ENCOUNTER — Telehealth: Payer: Self-pay

## 2020-01-15 NOTE — Telephone Encounter (Signed)
Alert received due to battery status on ILR.  Attempted contact to pt to discuss ending monitoring.  Pt has upcoming appt with Dr. Sallyanne Kuster on 2/18.  No answer, left message for pt to call back.

## 2020-01-16 NOTE — Telephone Encounter (Signed)
Patient is returning call in regards to ILR. Please call.

## 2020-01-16 NOTE — Telephone Encounter (Signed)
Pt returned phone call.  ILR battery is at RRT.  Device implanted due to cryptogenic stroke, pt now being treated for identified AF.  Pt is unsure if he wants device removed yet and will discuss with Dr. Sallyanne Kuster at visit in February.  Advised pt we will take device out of service, he can unplug home transmitter.  Return kit requested to be sent to pt.

## 2020-01-16 NOTE — Telephone Encounter (Signed)
Thanks

## 2020-01-19 ENCOUNTER — Other Ambulatory Visit: Payer: Self-pay | Admitting: Adult Health

## 2020-01-23 ENCOUNTER — Ambulatory Visit: Payer: PPO

## 2020-02-02 ENCOUNTER — Other Ambulatory Visit: Payer: Self-pay | Admitting: Adult Health

## 2020-02-02 ENCOUNTER — Ambulatory Visit: Payer: PPO | Attending: Internal Medicine

## 2020-02-02 DIAGNOSIS — Z23 Encounter for immunization: Secondary | ICD-10-CM | POA: Insufficient documentation

## 2020-02-02 NOTE — Progress Notes (Signed)
   Covid-19 Vaccination Clinic  Name:  [name]    MRN: [id number] DOB: [birth year]  02/02/2020  [name] was observed post Covid-19 immunization for 15 minutes without incidence. He was provided with Vaccine Information Sheet and instruction to access the V-Safe system.   [name] was instructed to call 911 with any severe reactions post vaccine: Marland Kitchen Difficulty breathing  . Swelling of your face and throat  . A fast heartbeat  . A bad rash all over your body  . Dizziness and weakness    Immunizations Administered    Name Date Dose VIS Date Route   Pfizer COVID-19 Vaccine 02/02/2020 12:09 PM 0.3 mL 12/05/2019 Intramuscular   Manufacturer: [place]   Lot: [id number]   [place]: [id number]

## 2020-02-05 ENCOUNTER — Telehealth: Payer: Self-pay | Admitting: Internal Medicine

## 2020-02-05 NOTE — Telephone Encounter (Signed)
Called/Left VM for Dr. Thurnell Garbe

## 2020-02-05 NOTE — Telephone Encounter (Signed)
I called and left VM to Dr. Thurnell Garbe

## 2020-02-05 NOTE — Telephone Encounter (Signed)
Pt's insurance co sent provider from [place] to patient home to examine him &  Dr. Francine Graven is seeking to speak w/ pt's PCP Valetta Fuller).  --Forwarding message to Provider to call [phone].   --glh

## 2020-02-09 ENCOUNTER — Ambulatory Visit: Payer: PPO

## 2020-02-12 ENCOUNTER — Telehealth: Payer: Self-pay | Admitting: *Deleted

## 2020-02-12 ENCOUNTER — Telehealth (INDEPENDENT_AMBULATORY_CARE_PROVIDER_SITE_OTHER): Payer: PPO | Admitting: Cardiovascular Disease

## 2020-02-12 ENCOUNTER — Encounter: Payer: Self-pay | Admitting: Cardiovascular Disease

## 2020-02-12 VITALS — BP 108/69 | HR 92 | Ht 69.0 in | Wt 222.0 lb

## 2020-02-12 DIAGNOSIS — I872 Venous insufficiency (chronic) (peripheral): Secondary | ICD-10-CM

## 2020-02-12 DIAGNOSIS — Z720 Tobacco use: Secondary | ICD-10-CM

## 2020-02-12 DIAGNOSIS — E1169 Type 2 diabetes mellitus with other specified complication: Secondary | ICD-10-CM

## 2020-02-12 DIAGNOSIS — E7849 Other hyperlipidemia: Secondary | ICD-10-CM | POA: Diagnosis not present

## 2020-02-12 DIAGNOSIS — I739 Peripheral vascular disease, unspecified: Secondary | ICD-10-CM

## 2020-02-12 DIAGNOSIS — I480 Paroxysmal atrial fibrillation: Secondary | ICD-10-CM | POA: Diagnosis not present

## 2020-02-12 DIAGNOSIS — Z9189 Other specified personal risk factors, not elsewhere classified: Secondary | ICD-10-CM

## 2020-02-12 DIAGNOSIS — E668 Other obesity: Secondary | ICD-10-CM | POA: Diagnosis not present

## 2020-02-12 DIAGNOSIS — E669 Obesity, unspecified: Secondary | ICD-10-CM

## 2020-02-12 DIAGNOSIS — Z7901 Long term (current) use of anticoagulants: Secondary | ICD-10-CM | POA: Diagnosis not present

## 2020-02-12 DIAGNOSIS — I2510 Atherosclerotic heart disease of native coronary artery without angina pectoris: Secondary | ICD-10-CM | POA: Diagnosis not present

## 2020-02-12 DIAGNOSIS — Z4509 Encounter for adjustment and management of other cardiac device: Secondary | ICD-10-CM

## 2020-02-12 DIAGNOSIS — E785 Hyperlipidemia, unspecified: Secondary | ICD-10-CM

## 2020-02-12 DIAGNOSIS — I10 Essential (primary) hypertension: Secondary | ICD-10-CM

## 2020-02-12 DIAGNOSIS — Z8673 Personal history of transient ischemic attack (TIA), and cerebral infarction without residual deficits: Secondary | ICD-10-CM | POA: Diagnosis not present

## 2020-02-12 MED ORDER — WELLBUTRIN SR 150 MG PO TB12: 150.0000 mg | ORAL_TABLET | Freq: Two times a day (BID) | ORAL | 11 refills | Status: DC

## 2020-02-12 MED ORDER — BUPROPION HCL ER (XL) 150 MG PO TB24: 150.0000 mg | ORAL_TABLET | Freq: Two times a day (BID) | ORAL | 0 refills | Status: DC

## 2020-02-12 NOTE — Telephone Encounter (Signed)
Virtual Visit Pre-Appointment Phone Call  "(Name), I am calling you today to discuss your upcoming appointment. We are currently trying to limit exposure to the virus that causes COVID-19 by seeing patients at home rather than in the office."  1. "What is the BEST phone number to call the day of the visit?" - include this in appointment notes  2. "Do you have or have access to (through a family member/friend) a smartphone with video capability that we can use for your visit?" a. If yes - list this number in appt notes as "cell" (if different from BEST phone #) and list the appointment type as a VIDEO visit in appointment notes b. If no - list the appointment type as a PHONE visit in appointment notes  3. Confirm consent - "In the setting of the current Covid19 crisis, you are scheduled for a (phone or video) visit with your provider on (date) at (time).  Just as we do with many in-office visits, in order for you to participate in this visit, we must obtain consent.  If you'd like, I can send this to your mychart (if signed up) or email for you to review.  Otherwise, I can obtain your verbal consent now.  All virtual visits are billed to your insurance company just like a normal visit would be.  By agreeing to a virtual visit, we'd like you to understand that the technology does not allow for your provider to perform an examination, and thus may limit your provider's ability to fully assess your condition. If your provider identifies any concerns that need to be evaluated in person, we will make arrangements to do so.  Finally, though the technology is pretty good, we cannot assure that it will always work on either your or our end, and in the setting of a video visit, we may have to convert it to a phone-only visit.  In either situation, we cannot ensure that we have a secure connection.  Are you willing to proceed?" YES  4. Advise patient to be prepared - "Two hours prior to your appointment, go  ahead and check your blood pressure, pulse, oxygen saturation, and your weight (if you have the equipment to check those) and write them all down. When your visit starts, your provider will ask you for this information. If you have an Apple Watch or Kardia device, please plan to have heart rate information ready on the day of your appointment. Please have a pen and paper handy nearby the day of the visit as well."  5. Give patient instructions for MyChart download to smartphone OR Doximity/Doxy.me as below if video visit (depending on what platform provider is using)  6. Inform patient they will receive a phone call 15 minutes prior to their appointment time (may be from unknown caller ID) so they should be prepared to answer    TELEPHONE CALL NOTE  [name] has been deemed a candidate for a follow-up tele-health visit to limit community exposure during the Covid-19 pandemic. I spoke with the patient via phone to ensure availability of phone/video source, confirm preferred email & phone number, and discuss instructions and expectations.  I reminded [name] to be prepared with any vital sign and/or heart rhythm information that could potentially be obtained via home monitoring, at the time of his visit. I reminded [name] to expect a phone call prior to his visit.  Ricci Barker, RN 02/12/2020 9:04 AM   INSTRUCTIONS  FOR DOWNLOADING THE MYCHART APP TO SMARTPHONE  - The patient must first make sure to have activated MyChart and know their login information - If Apple, go to [employer] and type in MyChart in the search bar and download the app. If Android, ask patient to go to [employer] and type in [place] in the search bar and download the app. The app is free but as with any other app downloads, their phone may require them to verify saved payment information or Apple/Android password.  - The patient will need to then log into the app with their MyChart username  and password, and select [facility] as their healthcare provider to link the account. When it is time for your visit, go to the MyChart app, find appointments, and click Begin Video Visit. Be sure to Select Allow for your device to access the Microphone and Camera for your visit. You will then be connected, and your provider will be with you shortly.  **If they have any issues connecting, or need assistance please contact MyChart service desk [phone])**  **If using a computer, in order to ensure the best quality for their visit they will need to use either of the following Internet Browsers: [employer], or Google Chrome**  IF USING DOXIMITY or DOXY.ME - The patient will receive a link just prior to their visit by text.     FULL LENGTH CONSENT FOR TELE-HEALTH VISIT   I hereby voluntarily request, consent and authorize [facility] and its employed or contracted physicians, physician assistants, nurse practitioners or other licensed health care professionals (the Practitioner), to provide me with telemedicine health care services (the "Services") as deemed necessary by the treating Practitioner. I acknowledge and consent to receive the Services by the Practitioner via telemedicine. I understand that the telemedicine visit will involve communicating with the Practitioner through live audiovisual communication technology and the disclosure of certain medical information by electronic transmission. I acknowledge that I have been given the opportunity to request an in-person assessment or other available alternative prior to the telemedicine visit and am voluntarily participating in the telemedicine visit.  I understand that I have the right to withhold or withdraw my consent to the use of telemedicine in the course of my care at any time, without affecting my right to future care or treatment, and that the Practitioner or I may terminate the telemedicine visit at any time. I  understand that I have the right to inspect all information obtained and/or recorded in the course of the telemedicine visit and may receive copies of available information for a reasonable fee.  I understand that some of the potential risks of receiving the Services via telemedicine include:  Marland Kitchen Delay or interruption in medical evaluation due to technological equipment failure or disruption; . Information transmitted may not be sufficient (e.g. poor resolution of images) to allow for appropriate medical decision making by the Practitioner; and/or  . In rare instances, security protocols could fail, causing a breach of personal health information.  Furthermore, I acknowledge that it is my responsibility to provide information about my medical history, conditions and care that is complete and accurate to the best of my ability. I acknowledge that Practitioner's advice, recommendations, and/or decision may be based on factors not within their control, such as incomplete or inaccurate data provided by me or distortions of diagnostic images or specimens that may result from electronic transmissions. I understand that the practice of medicine is not an exact science and  that Practitioner makes no warranties or guarantees regarding treatment outcomes. I acknowledge that I will receive a copy of this consent concurrently upon execution via email to the email address I last provided but may also request a printed copy by calling the office of [facility].    I understand that my insurance will be billed for this visit.   I have read or had this consent read to me. . I understand the contents of this consent, which adequately explains the benefits and risks of the Services being provided via telemedicine.  . I have been provided ample opportunity to ask questions regarding this consent and the Services and have had my questions answered to my satisfaction. . I give my informed consent for the services to be  provided through the use of telemedicine in my medical care  By participating in this telemedicine visit I agree to the above.

## 2020-02-12 NOTE — Telephone Encounter (Signed)
The patient has been called about the virtual appointment today with Dr. Croitoru. Instructions provided. The AVS will be mailed. The patient verbalized their understanding.  

## 2020-02-12 NOTE — Patient Instructions (Addendum)
Medication Instructions:  Wellbutrin 150 mg twice daily: a new prescription has been sent in for you. For the first three days, take one tablet daily then you may increase it to twice daily.   *If you need a refill on your cardiac medications before your next appointment, please call your pharmacy*  Lab Work: None ordered If you have labs (blood work) drawn today and your tests are completely normal, you will receive your results only by: Marland Kitchen MyChart Message (if you have MyChart) OR . A paper copy in the mail If you have any lab test that is abnormal or we need to change your treatment, we will call you to review the results.  Testing/Procedures: Your physician has requested that you have a lower extremity arterial duplex. During this test, ultrasound is used to evaluate arterial blood flow in the legs. Allow one hour for this exam. There are no restrictions or special instructions. This will take place at [place], [address].  Your physician has requested that you have an ankle brachial index (ABI). During this test an ultrasound and blood pressure cuff are used to evaluate the arteries that supply the arms and legs with blood. Allow thirty minutes for this exam. There are no restrictions or special instructions. This will take place at [place], [address].    Follow-Up: At [facility], you and your health needs are our priority.  As part of our continuing mission to provide you with exceptional heart care, we have created designated Provider Care Teams.  These Care Teams include your primary Cardiologist (physician) and Advanced Practice Providers (APPs -  Physician Assistants and Nurse Practitioners) who all work together to provide you with the care you need, when you need it.  Your next appointment:   12 month(s)  The format for your next appointment:   In Person  Provider:   You may see Sanda Klein, MD or one of the following Advanced Practice Providers on  your designated Care Team:    Almyra Deforest, PA-C  Fabian Sharp, [place] or   Roby Lofts, [place]   Other Instructions  Steps to Quit Smoking Smoking tobacco is the leading cause of preventable death. It can affect almost every organ in the body. Smoking puts you and people around you at risk for many serious, long-lasting (chronic) diseases. Quitting smoking can be hard, but it is one of the best things that you can do for your health. It is never too late to quit. How do I get ready to quit? When you decide to quit smoking, make a plan to help you succeed. Before you quit:  Pick a date to quit. Set a date within the next 2 weeks to give you time to prepare.  Write down the reasons why you are quitting. Keep this list in places where you will see it often.  Tell your family, friends, and co-workers that you are quitting. Their support is important.  Talk with your doctor about the choices that may help you quit.  Find out if your health insurance will pay for these treatments.  Know the people, places, things, and activities that make you want to smoke (triggers). Avoid them. What first steps can I take to quit smoking?  Throw away all cigarettes at home, at work, and in your car.  Throw away the things that you use when you smoke, such as ashtrays and lighters.  Clean your car. Make sure to empty the ashtray.  Clean your  home, including curtains and carpets. What can I do to help me quit smoking? Talk with your doctor about taking medicines and seeing a counselor at the same time. You are more likely to succeed when you do both.  If you are pregnant or breastfeeding, talk with your doctor about counseling or other ways to quit smoking. Do not take medicine to help you quit smoking unless your doctor tells you to do so. To quit smoking: Quit right away  Quit smoking totally, instead of slowly cutting back on how much you smoke over a period of time.  Go to counseling. You are  more likely to quit if you go to counseling sessions regularly. Take medicine You may take medicines to help you quit. Some medicines need a prescription, and some you can buy over-the-counter. Some medicines may contain a drug called nicotine to replace the nicotine in cigarettes. Medicines may:  Help you to stop having the desire to smoke (cravings).  Help to stop the problems that come when you stop smoking (withdrawal symptoms). Your doctor may ask you to use:  Nicotine patches, gum, or lozenges.  Nicotine inhalers or sprays.  Non-nicotine medicine that is taken by mouth. Find resources Find resources and other ways to help you quit smoking and remain smoke-free after you quit. These resources are most helpful when you use them often. They include:  Online chats with a Social worker.  Phone quitlines.  Printed Furniture conservator/restorer.  Support groups or group counseling.  Text messaging programs.  Mobile phone apps. Use apps on your mobile phone or tablet that can help you stick to your quit plan. There are many free apps for mobile phones and tablets as well as websites. Examples include Quit Guide from the [employer] and smokefree.gov  What things can I do to make it easier to quit?   Talk to your family and friends. Ask them to support and encourage you.  Call a phone quitline (1-800-QUIT-NOW), reach out to support groups, or work with a Social worker.  Ask people who smoke to not smoke around you.  Avoid places that make you want to smoke, such as: ? Bars. ? Parties. ? Smoke-break areas at work.  Spend time with people who do not smoke.  Lower the stress in your life. Stress can make you want to smoke. Try these things to help your stress: ? Getting regular exercise. ? Doing deep-breathing exercises. ? Doing yoga. ? Meditating. ? Doing a body scan. To do this, close your eyes, focus on one area of your body at a time from head to toe. Notice which parts of your body are tense.  Try to relax the muscles in those areas. How will I feel when I quit smoking? Day 1 to 3 weeks Within the first 24 hours, you may start to have some problems that come from quitting tobacco. These problems are very bad 2-3 days after you quit, but they do not often last for more than 2-3 weeks. You may get these symptoms:  Mood swings.  Feeling restless, nervous, angry, or annoyed.  Trouble concentrating.  Dizziness.  Strong desire for high-sugar foods and nicotine.  Weight gain.  Trouble pooping (constipation).  Feeling like you may vomit (nausea).  Coughing or a sore throat.  Changes in how the medicines that you take for other issues work in your body.  Depression.  Trouble sleeping (insomnia). Week 3 and afterward After the first 2-3 weeks of quitting, you may start to notice more positive  results, such as:  Better sense of smell and taste.  Less coughing and sore throat.  Slower heart rate.  Lower blood pressure.  Clearer skin.  Better breathing.  Fewer sick days. Quitting smoking can be hard. Do not give up if you fail the first time. Some people need to try a few times before they succeed. Do your best to stick to your quit plan, and talk with your doctor if you have any questions or concerns. Summary  Smoking tobacco is the leading cause of preventable death. Quitting smoking can be hard, but it is one of the best things that you can do for your health.  When you decide to quit smoking, make a plan to help you succeed.  Quit smoking right away, not slowly over a period of time.  When you start quitting, seek help from your doctor, family, or friends. This information is not intended to replace advice given to you by your health care provider. Make sure you discuss any questions you have with your health care provider. Document Revised: 09/05/2019 Document Reviewed: 03/01/2019 Elsevier Patient Education  [place].

## 2020-02-12 NOTE — Progress Notes (Signed)
Virtual Visit via Telephone Note   This visit type was conducted due to national recommendations for restrictions regarding the COVID-19 Pandemic (e.g. social distancing) in an effort to limit this patient's exposure and mitigate transmission in our community.  Due to his co-morbid illnesses, this patient is at least at moderate risk for complications without adequate follow up.  This format is felt to be most appropriate for this patient at this time.  The patient did not have access to video technology/had technical difficulties with video requiring transitioning to audio format only (telephone).  All issues noted in this document were discussed and addressed.  No physical exam could be performed with this format.  Please refer to the patient's chart for his  consent to telehealth for [facility].   Date:  02/12/2020   ID:  [name], DOB 11-09-1951, MRN [id number]  Patient Location: Home Provider Location: Home  PCP:  Carlyle Basques, MD  Cardiologist:  Sanda Klein, MD  Electrophysiologist:  None   Evaluation Performed:  Follow-Up Visit  Chief Complaint:  CAD, AFib  History of Present Illness:    [name] is a 69 y.o. male with coronary artery disease, CABG 4 in 2009, prior inferior wall infarction, hypertension, hyperlipidemia, type 2 diabetes mellitus, venous insufficiency of the lower extremities, previous right frontal cortical infarct, asymptomatic atrial fibrillation detected by implantable loop recorder.   The patient specifically denies any chest pain at rest exertion, dyspnea at rest or with exertion, orthopnea, paroxysmal nocturnal dyspnea, syncope, palpitations, focal neurological deficits, intermittent claudication, lower extremity edema, unexplained weight gain, cough, hemoptysis or wheezing.  She has bilateral tingling and numbness in his feet in a stocking distribution, consistent with diabetic neuropathy.  He sometimes has cramps in both legs.  He  does not give symptoms suggestive of true intermittent claudication since most of his complaints occur at rest, in both lower limbs.  Unfortunately he continues smoking.  He reports that he has "cut back" to less than a pack a day.  ILR download 01/04/2020 shows a stable low burden of brief episodes of paroxysmal atrial fibrillation (<1%), occasional RVR. Longest recent episode was 28 minutes.  He has significant arterial disease in the left leg, ABI 0.77, occlusion of SFA, brief reconstitution and occlusion of mid popliteal and tibioperoneal trunk and peroneal, posterior tibial mid-calf reconstitution (duplex US 02/2019).  He had a vascular screen performed in August 2019 that did not show any evidence of focal stenoses in the aorta or iliac arteries, mild abdominal aortic ectasia with a maximum diameter of 2.4 cm.  He did not have any meaningful carotid stenoses by ultrasound in 2017.  The patient does not have symptoms concerning for COVID-19 infection (fever, chills, cough, or new shortness of breath).    Past Medical History:  Diagnosis Date  . Abdominal aortic aneurysm (AAA) ([place])    3 cm per 07-02-18 US abdominal aorta US epic  . Arthritis   . Blood clot in vein    behind left knee  . Chronic kidney disease    renal calculi  . Chronic venous insufficiency LOWER EXTREMITIES  . Coronary artery disease CARDIOLOGIST - DR  [id number]  LAST 1 WK AGO -- WILL REQUEST NOTE AND STRESS TEST  . Diabetes mellitus without complication ([place])    type 2  . Hematuria    last year  . Hyperglycemia   . Hyperlipidemia   . Hypertension   . Mixed dyslipidemia   . Myocardial infarction ([place])   .  Neuropathy    toes only  . Nocturia   . Prostate cancer ([place]) 08/18/11   gleason 8, volume 24.4cc  . S/P CABG x 4   . ST elevation MI (STEMI) ([place]) 02-10-2008   S/P CABG  . Stroke [facility]) 2016   occ trouble wriing with right hand  . Urinary hesitancy   . Vision abnormalities    resolved now  . Wound  discharge    since jan 2019 goes to wound center [place] left great toe small open area changes dressing q 2 days with ointment provided by wound center, clear drainage occ   Past Surgical History:  Procedure Laterality Date  . CARDIAC CATHETERIZATION  05/01/2012   grafts widely patent  . CARDIOVASCULAR STRESS TEST  03-15-2010   INFERIOR WALL SCAR WITHOUT ANY MEANINGFUL ISCHEMIA/ EF 46% / LOW RISK SCAN  . CORONARY ARTERY BYPASS GRAFT  02-10-2008  DR Einar Gip   X4 VESSEL DISEASE / [facility] CABG  . CYSTOSCOPY  02/08/2012   Procedure: CYSTOSCOPY FLEXIBLE;  Surgeon: Franchot Gallo, MD;  Location: [facility];  Service: Urology;;  . [place], URETEROSCOPY AND STENT PLACEMENT Right 09/12/2018   Procedure: CYSTOSCOPY WITH RETROGRADE PYELOGRAM, URETEROSCOPY AND STENT PLACEMENT, STONE EXTRACTION;  Surgeon: Franchot Gallo, MD;  Location: [facility];  Service: Urology;  Laterality: Right;  . EP IMPLANTABLE DEVICE N/A 08/14/2016   Procedure: Loop Recorder Insertion;  Surgeon: Evans Lance, MD;  Location: [place] [facility];  Service: Cardiovascular;  Laterality: N/A;  . EXTRACORPOREAL SHOCK WAVE LITHOTRIPSY  2013  . HOLMIUM LASER APPLICATION Right [id number]   Procedure: HOLMIUM LASER APPLICATION;  Surgeon: Franchot Gallo, MD;  Location: [facility];  Service: Urology;  Laterality: Right;  . KNEE SURGERY  age 69   RIGHT  . LEFT HEART CATHETERIZATION WITH CORONARY/GRAFT ANGIOGRAM N/A 05/01/2012   Procedure: LEFT HEART CATHETERIZATION WITH Beatrix Fetters;  Surgeon: Sanda Klein, MD;  Location: [place] [facility];  Service: Cardiovascular;  Laterality: N/A;  . MULTIPLE TEETH EXTRACTIONS (23)/ FOUR QUADRANT ALVEOLPLASTY/ MANDIBULAR LATERAL EXOSTOSES REDUCTIONS  03-24-2010   CHRONIC PERIODONTITIS  . RADIOACTIVE SEED IMPLANT  02/08/2012   Procedure: RADIOACTIVE SEED IMPLANT;  Surgeon: Franchot Gallo, MD;  Location: [facility];  Service: Urology;   Laterality: N/A;  C-ARM   . SHOULDER SURGERY  1982   LEFT  . TEE WITHOUT CARDIOVERSION N/A 08/14/2016   Procedure: TRANSESOPHAGEAL ECHOCARDIOGRAM (TEE);  Surgeon: Josue Hector, MD;  [facility];  Service: Cardiovascular;  Laterality: N/A;  . URETERAL STENT PLACEMENT       No outpatient medications have been marked as taking for the 02/12/20 encounter (Appointment) with Sanda Klein, MD.     Allergies:   Bee venom, Oysters [shellfish allergy], and Penicillins   Social History   Tobacco Use  . Smoking status: Current Every Day Smoker    Packs/day: 1.00    Years: 50.00    Pack years: 50.00    Types: Cigarettes  . Smokeless tobacco: Never Used  . Tobacco comment: PREVIOUSLY SMOKED 3PPD / DECREASED TO 1PPD SINCE 2009  Substance Use Topics  . Alcohol use: Not Currently    Alcohol/week: 2.0 standard drinks    Types: 1 Cans of beer, 1 Shots of liquor per week    Comment: none in 3 years  . Drug use: No     Family Hx: The patient's family history includes Cancer in his father; Diabetes in his father.  ROS:   Please see the  history of present illness.     All other systems reviewed and are negative.   Prior CV studies:   The following studies were reviewed today:  ILR download   Labs/Other Tests and Data Reviewed:    EKG:  An ECG dated 01/28/2019 was personally reviewed today and demonstrated:  NSR, Q waves inferior leads and V4-V6, mild chronic inferior ST depression, lateral T wave flattening.  Recent Labs: 09/02/2019: ALT 22; BUN 10; Creatinine, Ser 0.72; Hemoglobin 13.6; Platelets 222; Potassium 4.0; Sodium 140; TSH 1.710   Recent Lipid Panel Lab Results  Component Value Date/Time   CHOL 75 (L) 09/02/2019 09:50 AM   TRIG 106 09/02/2019 09:50 AM   HDL 30 (L) 09/02/2019 09:50 AM   CHOLHDL 2.5 09/02/2019 09:50 AM   CHOLHDL 6.7 08/12/2016 12:48 AM   LDLCALC 25 09/02/2019 09:50 AM    Wt Readings from Last 3 Encounters:  11/27/19 223 lb (101.2 kg)   09/11/19 232 lb 11.2 oz (105.6 kg)  08/25/19 233 lb (105.7 kg)     Objective:    Vital Signs:  BP 108/69   Pulse 92   Ht 5\' 9"  (1.753 m)   Wt 222 lb (100.7 kg)   BMI 32.78 kg/m    VITAL SIGNS:  reviewed unable to examine  ASSESSMENT & PLAN:    1. Paroxysmal atrial fibrillation (HCC)   2. Long term (current) use of anticoagulants   3. Coronary artery disease involving native coronary artery of native heart without angina pectoris   4. Essential hypertension   5. Dyslipidemia (high LDL; low HDL)   6. Diabetes mellitus type 2 in obese ([place])   7. Tobacco abuse   8. Moderate obesity   9. Encounter for loop recorder check   10. PAD (peripheral artery disease) ([place])   11. Venous insufficiency of both lower extremities             1. AFib:  Asymptomatic and very low prevalence.  No need for rate control/antiarrhythmics.  On anticoagulation.  CHADSVasc 5 (CVA, HTN, DM, CAD).  2. Eliquis:  No recent bleeding, falls or serious injuries.  Past problems with hematuria due to nephrolithiasis, not seen recently. 3. CAD:  Asymptomatic despite an active lifestyle. 4. HTN:  Excellent control.  He did not tolerate higher doses of ACE inhibitor. 5. HLP:  Excellent LDL, low HDL that will not improve without substantial weight loss. 6. DM:  Fair control with most recent hemoglobin A1c 7.3%, clear to a little better. 7. Tobacco abuse:  Having trouble quitting smoking.  Smokes less when he is very busy.  His insurance would not cover Wellbutrin.  We will try to get that approved.  Reinforced the critical importance of smoking cessation to avoid amputations and other vascular complications. 8. Obesity:  Keep trying to lose weight since this will improve his metabolic profile and overall outlook from vascular disease. 9. ILR:  Has consistently documented infrequent episodes of brief paroxysmal atrial fibrillation.  No need to reimplant unless there is a change in symptoms.  Device is at end of  service. 10. Venous insufficiency of the lower extremities:  Minimal edema, chronic hyperpigmentation, no serious complaints. 11. PAD LLE: occlusion SFA and significant infrapopliteal disease.  Does not appear to be describing claudication.  Saw Dr. Sherren Mocha early on April 15, 2019, conservative management recommended for now.  We will schedule for repeat ultrasound on a yearly basis.   COVID-19 Education: The signs and symptoms of COVID-19 were discussed with the  patient and how to seek care for testing (follow up with PCP or arrange E-visit).  The importance of social distancing was discussed today.  Time:   Today, I have spent 15 minutes with the patient with telehealth technology discussing the above problems.     Medication Adjustments/Labs and Tests Ordered: Current medicines are reviewed at length with the patient today.  Concerns regarding medicines are outlined above.   Tests Ordered: No orders of the defined types were placed in this encounter.   Medication Changes: No orders of the defined types were placed in this encounter.   Follow Up:  In Person 1 year  Signed, Sanda Klein, MD  02/12/2020 8:13 AM    [place]

## 2020-02-26 DIAGNOSIS — M1711 Unilateral primary osteoarthritis, right knee: Secondary | ICD-10-CM | POA: Diagnosis not present

## 2020-02-26 DIAGNOSIS — M19012 Primary osteoarthritis, left shoulder: Secondary | ICD-10-CM | POA: Diagnosis not present

## 2020-02-28 DIAGNOSIS — M19012 Primary osteoarthritis, left shoulder: Secondary | ICD-10-CM | POA: Diagnosis not present

## 2020-02-28 DIAGNOSIS — M1711 Unilateral primary osteoarthritis, right knee: Secondary | ICD-10-CM | POA: Diagnosis not present

## 2020-03-01 ENCOUNTER — Ambulatory Visit: Payer: PPO | Admitting: Adult Health

## 2020-03-15 ENCOUNTER — Encounter (HOSPITAL_COMMUNITY): Payer: PPO

## 2020-03-22 ENCOUNTER — Ambulatory Visit: Payer: PPO | Admitting: Family Medicine

## 2020-03-24 ENCOUNTER — Ambulatory Visit (HOSPITAL_COMMUNITY)
Admission: RE | Admit: 2020-03-24 | Discharge: 2020-03-24 | Disposition: A | Discharge status: Home / Self Care | Payer: PPO | Source: Ambulatory Visit | Attending: Internal Medicine | Admitting: Internal Medicine

## 2020-03-24 ENCOUNTER — Other Ambulatory Visit: Payer: Self-pay | Admitting: *Deleted

## 2020-03-24 ENCOUNTER — Other Ambulatory Visit: Payer: Self-pay

## 2020-03-24 DIAGNOSIS — I739 Peripheral vascular disease, unspecified: Secondary | ICD-10-CM

## 2020-04-12 ENCOUNTER — Other Ambulatory Visit: Payer: Self-pay | Admitting: Cardiovascular Disease

## 2020-04-13 ENCOUNTER — Ambulatory Visit: Payer: PPO | Admitting: Family Medicine

## 2020-04-19 ENCOUNTER — Ambulatory Visit (INDEPENDENT_AMBULATORY_CARE_PROVIDER_SITE_OTHER): Payer: PPO | Admitting: Family Medicine

## 2020-04-19 ENCOUNTER — Encounter: Payer: Self-pay | Admitting: Family Medicine

## 2020-04-19 ENCOUNTER — Other Ambulatory Visit: Payer: Self-pay

## 2020-04-19 VITALS — BP 135/84 | HR 92 | Temp 97.6°F | Ht 71.0 in | Wt 231.3 lb

## 2020-04-19 DIAGNOSIS — I10 Essential (primary) hypertension: Secondary | ICD-10-CM

## 2020-04-19 DIAGNOSIS — E1142 Type 2 diabetes mellitus with diabetic polyneuropathy: Secondary | ICD-10-CM | POA: Diagnosis not present

## 2020-04-19 DIAGNOSIS — E669 Obesity, unspecified: Secondary | ICD-10-CM | POA: Diagnosis not present

## 2020-04-19 DIAGNOSIS — E1169 Type 2 diabetes mellitus with other specified complication: Secondary | ICD-10-CM

## 2020-04-19 DIAGNOSIS — E782 Mixed hyperlipidemia: Secondary | ICD-10-CM | POA: Diagnosis not present

## 2020-04-19 DIAGNOSIS — R809 Proteinuria, unspecified: Secondary | ICD-10-CM | POA: Diagnosis not present

## 2020-04-19 DIAGNOSIS — E1159 Type 2 diabetes mellitus with other circulatory complications: Secondary | ICD-10-CM | POA: Diagnosis not present

## 2020-04-19 DIAGNOSIS — E1129 Type 2 diabetes mellitus with other diabetic kidney complication: Secondary | ICD-10-CM

## 2020-04-19 LAB — POCT GLYCOSYLATED HEMOGLOBIN (HGB A1C): Hemoglobin A1C: 7.6 % — AB (ref 4.0–5.6)

## 2020-04-19 LAB — POCT UA - MICROALBUMIN
Creatinine, POC: 50 mg/dL
Microalbumin Ur, POC: 80 mg/L

## 2020-04-19 MED ORDER — OZEMPIC (0.25 OR 0.5 MG/DOSE) 2 MG/1.5ML ~~LOC~~ SOPN: PEN_INJECTOR | SUBCUTANEOUS | 0 refills | Status: AC

## 2020-04-19 NOTE — Progress Notes (Signed)
Impression and Recommendations:    1. Diabetes mellitus type 2 in obese ([place])   2. Microalbuminuria due to type 2 diabetes mellitus ([place])   3. Hypertension associated with diabetes ([place])   4. Mixed diabetic hyperlipidemia associated with type 2 diabetes mellitus ([place])   5. Diabetic polyneuropathy associated with type 2 diabetes mellitus ([place])   6. Morbid obesity ([place])     Of note, this is my first time meeting patient.  Patient is new to me and was previously being cared for at our office by an NP, who no longer works at [facility] [employer].   Will be seen by [name], PA-C in future.   - Last seen 11/27/2019.  Orthopedic Concerns - Arthritis - Advised patient to follow up with Sports Medicine for long-term management of orthopedic concerns. - Encouraged patient to seek treatment for arthritis through specialist as discussed today.  If patient is experiencing new pain in leg or knee, he knows to follow up with specialist as discussed.  - Will continue to monitor alongside specialist.   Urological Concerns - Per patient, experiences concerns time to time. He mentions this at end of OV. - Advised patient to call his urologist for follow-up as scheduled. - Will continue to monitor alongside specialist.   Diabetes mellitus type 2 in obese- NOT at goal - A1c today is 7.6, up from 7.3 four months ago.  - Discussed goal A1c of under 8.0 in patients over age 47. However, with his Diab Neuropathy and Diab proteinuria etc, wish for tighter control- goal less than 7.0-7.5!  - Per patient, continues metformin BID as prescribed.  See med list.  - Discussed modification of treatment plan today to achieve improved control of blood sugar. - Denies personal history of pancreatitis.  Denies family history of thyroid cancer. - Denies nausea, vomiting, or known GI concerns where he has diarrhea, etc. - Begin Ozempic today for once-weekly dosage.  See med list.  - Counseled  patient on pathophysiology of disease and discussed various treatment options, which always includes dietary and lifestyle modification as first line.    - Importance of low carb, heart-healthy diet discussed with patient in addition to regular aerobic exercise of 65mn 5d/week or more.   - Check FBS and 2 hours after the biggest meal of your day.  Keep log and bring in next OV for my review.     - Also told patient if you ever feel poorly, please check your blood pressure and blood sugar, as one or the other could be the cause of your symptoms.  - Pt reminded about need for yearly eye and foot exams.  Told patient to make appt.for diabetic eye exam, CMAs here will do foot exams  - We will continue to monitor and re-check in 3 months.   Diabetic Polyneuropathy associated with DM -  Managed on gabapentin.  See med list.  - To help reduce neuropathy symptoms, advised patient to work on controlling his blood sugar, engaging in regular exercise, and working toward prudent weight loss.  - Will continue to monitor.   Microalbuminuria due to Type 2 DM - Evidence of proteinuria appreciated in urine today. - 30-300 today, stable from 30-300 one year ago.  - To help improve microalbuminuria and preserve overall kidney health, advised patient to work on controlling his blood pressure and blood sugar, avoiding nephrotoxic substances, and engaging in regular physical activity, especially a formal exercise routine.  - Will continue  to monitor.   Hypertension associated with DM - Blood pressure currently is stable, at goal. - Patient will continue current treatment regimen.  See med list.  - Counseled patient on pathophysiology of disease and discussed various treatment options, which always includes dietary and lifestyle modification as first line.   - Lifestyle changes such as dash and heart healthy diets and engaging in a regular exercise program discussed extensively with patient.   -  Ambulatory blood pressure monitoring encouraged at least 3 times weekly.  Keep log and bring in every office visit.  Reminded patient that if they ever feel poorly in any way, to check their blood pressure and pulse.  - We will continue to monitor.   Mixed Diabetic HLD associated with Type 2 DM - Cholesterol levels at goal last check seven months ago- LDL at goal.    - Sees Cards as well for this, cont current meds unless pt is told otherwise by Cards  - Dietary changes such as low saturated & trans fat diets for hyperlipidemia and low carb diets for hypertriglyceridemia discussed with patient.    - Encouraged patient to follow AHA guidelines for regular exercise and also engage in weight loss if BMI above 25.   - We will continue to monitor and re-check as discussed.   BMI Counseling - Body mass index is 32.26 kg/m Explained to patient what BMI refers to, and what it means medically.    Told patient to think about it as a "medical risk stratification measurement" and how increasing BMI is associated with increasing risk/ or worsening state of various diseases such as hypertension, hyperlipidemia, diabetes, premature OA, depression etc.  American Heart Association guidelines for healthy diet, basically Mediterranean diet, and exercise guidelines of 30 minutes 5 days per week or more discussed in detail.  Health counseling performed.  All questions answered.   Health Counseling & Preventative Maintenance - Advised patient to continue working toward exercising to improve overall mental, physical, and emotional health.    - Encouraged patient to engage in daily physical activity as tolerated, especially a formal exercise routine.  Recommended that the patient eventually strive for at least 150 minutes of moderate cardiovascular activity per week according to guidelines established by the [facility].   - Healthy dietary habits encouraged, including low-carb, and high amounts of lean protein in  diet.   - Patient should also consume adequate amounts of water. 1/2 wt in oz/d   Orders Placed This Encounter  Procedures  . POCT glycosylated hemoglobin (Hb A1C)  . POCT UA - Microalbumin    Meds ordered this encounter  Medications  . Semaglutide,0.25 or 0.5MG/DOS, (OZEMPIC, 0.25 OR 0.5 MG/DOSE,) 2 MG/1.5ML SOPN    Sig: Inject 0.25 mg into the skin once a week for 14 days, THEN 0.5 mg once a week.    Dispense:  5 pen    Refill:  0     Please see AVS handed out to patient at the end of our visit for further patient instructions/ counseling done pertaining to today's office visit.   Return for diabetes follow up every 3 mo- started ozempic in add to metformin.     Note:  This note was prepared with assistance of Dragon voice recognition software. Occasional wrong-word or sound-a-like substitutions may have occurred due to the inherent limitations of voice recognition software.   The [place] was signed into law in 2016 which includes the topic of electronic health records.  This provides immediate  access to information in MyChart.  This includes consultation notes, operative notes, office notes, lab results and pathology reports.  If you have any questions about what you read please let us know at your next visit or call us at the office.  We are right here with you.   This case required medical decision making of at least moderate complexity.  This document serves as a record of services personally performed by Mellody Dance, DO. It was created on her behalf by Toni Amend, a trained medical scribe. The creation of this record is based on the scribe's personal observations and the provider's statements to them.    The above documentation from Toni Amend, medical scribe, has been reviewed by Marjory Sneddon,  D.O.       --------------------------------------------------------------------------------------------------------------------------------------------------------------------------------------------------------------------------------------------    Subjective:     [name], am serving as scribe for Dr.Konstantin Lehnen.   HPI: [name] is a 69 y.o. male who presents to [place] [facility] today for issues as discussed below.  Obtained his COVID-19 vaccine about a month ago.  - Eyesight Feels his eyesight is going down.  Last visit to the eye doctor, was told he may be developing a cataract.  - Diabetic Neuropathy Takes gabapentin for his feet, because "they get to tingling."  Experiencing new numbness and tingling in his foot.  He has knee and hip problems.  Says he was told in the past that his concerns were due to arthritis.  He has been to a podiatrist, and Sports Medicine in the past.  [place] Mylar, he was at the [employer], and notes he "put on his big boots," and had no hip pain after this.  However, he was in the greenhouse wearing tennis shoes at 6 AM this morning, and was in a lot more pain afterwards.  Notes he wears orthotics and compression stockings time to time.  - Cardiology Follow-Up Had a flow test done on his leg in the past because it was "cramping up."  Says "they said it was good."  - Current Smoker He continues smoking, "not as much."  HPI:   Diabetes Mellitus:  Home glucose readings:  Isn't checking his blood sugars.  Notes the sensor makes his arms hurt, and he has difficulty using the finger pricker.  His sugar typically runs around 120 in terms of fasting blood sugars.  He typically only checks his fasting blood sugars, and "when I'm doing it, I do it every day."  - Patient reports good compliance with therapy plan: medication.  Notes for breakfast, loves eating bacon, sausage, and "those little white  donuts."  Take two metformin in the morning, and two metformin at night.  - His denies acute concerns or problems related to treatment plan  - He denies new concerns.  Denies polyuria/polydipsia, hypo/ hyperglycemia symptoms.  Denies new onset of: chest pain, exercise intolerance, shortness of breath, dizziness, visual changes, headache, lower extremity swelling or claudication.   Last A1C in the office was:  Lab Results  Component Value Date   HGBA1C 7.6 (A) 04/19/2020   HGBA1C 7.3 (H) 11/25/2019   HGBA1C 7.3 (H) 09/02/2019   Lab Results  Component Value Date   MICROALBUR 80 04/19/2020   LDLCALC 25 09/02/2019   CREATININE 0.72 (L) 09/02/2019   BP Readings from Last 3 Encounters:  04/19/20 135/84  02/12/20 108/69  11/27/19 115/72   Wt Readings from Last 3 Encounters:  04/19/20 231 lb 4.8 oz (104.9 kg)  02/12/20 222 lb (100.7 kg)  11/27/19 223 lb (101.2 kg)    HPI:  Hypertension:  Does not regularly check his blood pressure at home.  - Patient reports good compliance with medication and/or lifestyle modification  - His denies acute concerns or problems related to treatment plan  - He denies new onset of: chest pain, exercise intolerance, shortness of breath, dizziness, visual changes, headache, lower extremity swelling or claudication.   Last 3 blood pressure readings in our office are as follows: BP Readings from Last 3 Encounters:  04/19/20 135/84  02/12/20 108/69  11/27/19 115/72   Filed Weights   04/19/20 1027  Weight: 231 lb 4.8 oz (104.9 kg)    HPI:  Hyperlipidemia:  69 y.o. male here for cholesterol follow-up.   - Patient reports good compliance with treatment plan of:  medication and/ or lifestyle management.    - Patient denies any acute concerns or problems with management plan   - He denies new onset of: myalgias, arthralgias, increased fatigue more than normal, chest pains, exercise intolerance, shortness of breath, dizziness, visual changes,  headache, lower extremity swelling or claudication.   Most recent cholesterol panel was:  Lab Results  Component Value Date   CHOL 75 (L) 09/02/2019   HDL 30 (L) 09/02/2019   LDLCALC 25 09/02/2019   TRIG 106 09/02/2019   CHOLHDL 2.5 09/02/2019   Hepatic Function Latest Ref Rng & Units 09/02/2019 08/25/2019 01/14/2019  Total Protein 6.0 - 8.5 g/dL 6.9 7.2 6.8  Albumin 3.8 - 4.8 g/dL 4.4 4.4 4.3  AST 0 - 40 IU/L _0 ALT 0 - 44 IU/L _1 Alk Phosphatase 39 - 117 IU/L 130(H) 124(H) 108  Total Bilirubin 0.0 - 1.2 mg/dL 0.3 0.2 0.3  Bilirubin, Direct - - - -      Wt Readings from Last 3 Encounters:  04/19/20 231 lb 4.8 oz (104.9 kg)  02/12/20 222 lb (100.7 kg)  11/27/19 223 lb (101.2 kg)   BP Readings from Last 3 Encounters:  04/19/20 135/84  02/12/20 108/69  11/27/19 115/72   Pulse Readings from Last 3 Encounters:  04/19/20 92  02/12/20 92  11/27/19 72   BMI Readings from Last 3 Encounters:  04/19/20 32.26 kg/m  02/12/20 32.78 kg/m  11/27/19 33.66 kg/m     Patient Care Team    Relationship Specialty Notifications Start End  [name], [place] PCP - General Physician Assistant  04/19/20   Sanda Klein, MD PCP - Cardiology Cardiology Admissions 01/16/18   Carlyle Basques, MD Consulting Physician Infectious Diseases  03/18/20   Currence, Maia Breslow, PA-C  Orthopedic Surgery  04/19/20    Comment: [facility]     Patient Active Problem List   Diagnosis Date Noted  . Microalbuminuria due to type 2 diabetes mellitus ([place]) 04/19/2020  . Mixed diabetic hyperlipidemia associated with type 2 diabetes mellitus ([place]) 04/19/2020  . Diabetic polyneuropathy associated with type 2 diabetes mellitus ([place]) 04/19/2020  . Wound of left leg 09/11/2019  . Tick bite of right lower leg 04/21/2019  . Dyslipidemia (high LDL; low HDL) 01/28/2019  . PAD (peripheral artery disease) ([place]) 01/28/2019  . Great toe pain, left 10/21/2018  . Encounter for Medicare annual  wellness exam 06/20/2018  . Laceration of left lower leg 04/17/2018  . Laceration of skin of left lower leg 04/01/2018  . Increased urinary frequency 04/01/2018  . Hematuria 01/30/2018  . Puncture wound of toe of left foot 01/07/2018  . At  risk for diabetic foot ulcer 01/07/2018  . Need for pneumococcal vaccination 11/27/2017  . Screening for colon cancer 11/27/2017  . Healthcare maintenance 11/27/2017  . Paroxysmal atrial fibrillation ([place]) 11/22/2016  . History of embolic stroke 65/79/0383  . Diabetes mellitus type 2 in obese ([place]) 11/22/2016  . Venous insufficiency of both lower extremities 09/21/2016  . Stroke ([place]) 08/12/2016  . Hyperglycemia   . Hypertension associated with diabetes ([place])   . Vision, loss, sudden   . CVA (cerebral infarction) 08/11/2016  . CAD - inferior MI 2009, CABG 2009, patent grafts cath 04/2012 09/23/2013  . Morbid obesity ([place]) 09/23/2013  . Hyperlipidemia 09/23/2013  . Tobacco abuse 09/23/2013  . Prostate cancer ([place]) 08/18/2011  . Myocardial infarction [facility])     Past Medical history, Surgical history, Family history, Social history, Allergies and Medications have been entered into the medical record, reviewed and changed as needed.    Current Meds  Medication Sig  . atorvastatin (LIPITOR) 10 MG tablet Take 1 tablet by mouth once daily  . Continuous Blood Gluc Receiver (FREESTYLE LIBRE 14 DAY READER) DEVI 1 Device by Does not apply route daily. Use to check blood sugars every morning and 2 hours after largest meals  . Continuous Blood Gluc Sensor (FREESTYLE LIBRE 14 DAY SENSOR) MISC USE TO CHECK BLOOD SUGARS IN THE MORNING FASTING AND  2  HOURS  AFTER  LARGEST  MEAL  . ELIQUIS 5 MG TABS tablet TAKE 1 TABLET BY MOUTH TWICE DAILY(PLEASE SCHEDULE MD APPT FOR FURTHER REFILLS)  . finasteride (PROSCAR) 5 MG tablet Take 5 mg by mouth daily.  [place] gabapentin (NEURONTIN) 300 MG capsule TAKE 1 CAPSULE BY MOUTH IN THE MORNING AND 2 IN THE EVENING AT  DINNER  TIME   . glucose blood (FREESTYLE LITE) test strip Use to check blood sugars every morning fasting  . lisinopril (ZESTRIL) 5 MG tablet Take 1/2 (one-half) tablet by mouth once daily  . metFORMIN (GLUCOPHAGE-XR) 500 MG 24 hr tablet Take 4 tablets daily with breakfast. (Patient taking differently: Take 2,000 mg by mouth 2 (two) times daily. Take 4 tablets daily with breakfast.)  . Multiple Vitamin (MULTIVITAMIN WITH MINERALS) TABS tablet Take 1 tablet by mouth daily. Men's One-A-Day Multivitamin  . oxybutynin (DITROPAN) 5 MG tablet Take 1 tablet (5 mg total) by mouth every 8 (eight) hours as needed for bladder spasms.  . tamsulosin (FLOMAX) 0.4 MG CAPS capsule Take 1 capsule by mouth at bedtime  . WELLBUTRIN SR 150 MG 12 hr tablet Take 1 tablet (150 mg total) by mouth 2 (two) times daily.    Allergies:  Allergies  Allergen Reactions  . Bee Venom Anaphylaxis  . Oysters [Shellfish Allergy] Swelling    Swelling of hands  . Penicillins Other (See Comments)    CAUSES "FREE BLEEDING" Has patient had a PCN reaction causing immediate rash, facial/tongue/throat swelling, SOB or lightheadedness with hypotension: No Has patient had a PCN reaction causing severe rash involving mucus membranes or skin necrosis: No Has patient had a PCN reaction that required hospitalization No Has patient had a PCN reaction occurring within the last 10 years: No If all of the above answers are "NO", then may proceed with Cephalosporin use.     Review of Systems:  A fourteen system review of systems was performed and found to be positive as per HPI.   Objective:   Blood pressure 135/84, pulse 92, temperature 97.6 F (36.4 C), temperature source Oral, height 5' 11" (1.803 m), weight 231  lb 4.8 oz (104.9 kg), SpO2 98 %. Body mass index is 32.26 kg/m. General:  Well Developed, well nourished, appropriate for stated age.  Neuro:  Alert and oriented,  extra-ocular muscles intact  HEENT:  Normocephalic, atraumatic, neck  supple, no carotid bruits appreciated  Skin:  no gross rash, warm, pink. Cardiac:  RRR, S1 S2 Respiratory:  ECTA B/L and A/P, Not using accessory muscles, speaking in full sentences- unlabored. Vascular:  Ext warm, no cyanosis apprec.; cap RF less 2 sec. Psych:  No HI/SI, judgement and insight good, Euthymic mood. Full Affect.

## 2020-04-19 NOTE — Patient Instructions (Signed)

## 2020-04-26 ENCOUNTER — Other Ambulatory Visit: Payer: Self-pay | Admitting: Adult Health

## 2020-04-28 ENCOUNTER — Other Ambulatory Visit: Payer: Self-pay | Admitting: Physician Assistant

## 2020-04-28 MED ORDER — METFORMIN HCL ER 500 MG PO TB24: ORAL_TABLET | ORAL | 0 refills | Status: DC

## 2020-04-28 MED ORDER — GABAPENTIN 300 MG PO CAPS: ORAL_CAPSULE | ORAL | 0 refills | Status: DC

## 2020-04-28 NOTE — Telephone Encounter (Signed)
Patient request refill on :   gabapentin (NEURONTIN) 300 MG capsule [id number]   Order Details Dose, Route, Frequency: As Directed  Dispense Quantity: 270 capsule Refills: 0       Sig: TAKE 1 CAPSULE BY MOUTH IN THE MORNING AND 2 IN THE EVENING AT DINNER TIME   &   metFORMIN (GLUCOPHAGE-XR) 500 MG 24 hr tablet [id number]   Order Details Dose, Route, Frequency: As Directed  Dispense Quantity: 360 tablet Refills: 0   Indications of Use: Type 2 Diabetes Mellitus      Sig: Take 4 tablets daily with breakfast.  Patient taking differently: Take 2,000 mg by mouth 2 (two) times daily. Take 4 tablets daily with breakfast.       ---Forwarding request to med asst -- Pt's LOV was 4/26 w/ Dr. Raliegh Scarlet.  Send refill order to :  [facility] ([address] [place] [id number] (Phone) [phone] (Fax   --glh

## 2020-04-28 NOTE — Telephone Encounter (Signed)
Med refill sent to pharmacy. AS, CMA 

## 2020-05-05 ENCOUNTER — Other Ambulatory Visit: Payer: Self-pay | Admitting: Adult Health

## 2020-05-10 DIAGNOSIS — H2513 Age-related nuclear cataract, bilateral: Secondary | ICD-10-CM | POA: Diagnosis not present

## 2020-05-10 DIAGNOSIS — H524 Presbyopia: Secondary | ICD-10-CM | POA: Diagnosis not present

## 2020-05-20 ENCOUNTER — Other Ambulatory Visit: Payer: Self-pay | Admitting: Physician Assistant

## 2020-05-20 MED ORDER — TAMSULOSIN HCL 0.4 MG PO CAPS: 0.4000 mg | ORAL_CAPSULE | Freq: Every day | ORAL | 0 refills | Status: DC

## 2020-05-20 MED ORDER — ATORVASTATIN CALCIUM 10 MG PO TABS: 10.0000 mg | ORAL_TABLET | Freq: Every day | ORAL | 0 refills | Status: DC

## 2020-05-20 NOTE — Telephone Encounter (Signed)
Patient called states his pharmacy says provider needs to approve refills on :  atorvastatin (LIPITOR) 10 MG tablet [id number]   Order Details Dose, Route, Frequency: As Directed  Dispense Quantity: 90 tablet Refills: 0       Sig: Take 1 tablet by mouth once daily   &   tamsulosin (FLOMAX) 0.4 MG CAPS capsule [id number]   Order Details Dose, Route, Frequency: As Directed  Dispense Quantity: 90 capsule Refills: 0       Sig: Take 1 capsule by mouth at bedtime   Please send refill order to :   [facility] ([address]), [place] [address] [id number] (Phone) [phone] (Fax   ---Forwarding request to med asst .  -glh

## 2020-07-17 ENCOUNTER — Other Ambulatory Visit: Payer: Self-pay | Admitting: Adult Health

## 2020-07-20 ENCOUNTER — Encounter: Payer: Self-pay | Admitting: Physician Assistant

## 2020-07-20 ENCOUNTER — Other Ambulatory Visit: Payer: Self-pay

## 2020-07-20 ENCOUNTER — Telehealth: Payer: Self-pay | Admitting: Cardiovascular Disease

## 2020-07-20 ENCOUNTER — Other Ambulatory Visit: Payer: Self-pay | Admitting: Physician Assistant

## 2020-07-20 ENCOUNTER — Other Ambulatory Visit: Payer: Self-pay | Admitting: Cardiovascular Disease

## 2020-07-20 ENCOUNTER — Ambulatory Visit (INDEPENDENT_AMBULATORY_CARE_PROVIDER_SITE_OTHER): Payer: PPO | Admitting: Physician Assistant

## 2020-07-20 VITALS — BP 117/70 | HR 77 | Temp 98.1°F | Ht 71.0 in | Wt 227.3 lb

## 2020-07-20 DIAGNOSIS — E1169 Type 2 diabetes mellitus with other specified complication: Secondary | ICD-10-CM

## 2020-07-20 DIAGNOSIS — E669 Obesity, unspecified: Secondary | ICD-10-CM

## 2020-07-20 DIAGNOSIS — I10 Essential (primary) hypertension: Secondary | ICD-10-CM | POA: Diagnosis not present

## 2020-07-20 DIAGNOSIS — R350 Frequency of micturition: Secondary | ICD-10-CM | POA: Diagnosis not present

## 2020-07-20 DIAGNOSIS — E782 Mixed hyperlipidemia: Secondary | ICD-10-CM

## 2020-07-20 DIAGNOSIS — E1142 Type 2 diabetes mellitus with diabetic polyneuropathy: Secondary | ICD-10-CM

## 2020-07-20 DIAGNOSIS — E1159 Type 2 diabetes mellitus with other circulatory complications: Secondary | ICD-10-CM | POA: Diagnosis not present

## 2020-07-20 LAB — POCT GLYCOSYLATED HEMOGLOBIN (HGB A1C): Hemoglobin A1C: 7.6 % — AB (ref 4.0–5.6)

## 2020-07-20 MED ORDER — GABAPENTIN 300 MG PO CAPS: ORAL_CAPSULE | ORAL | 0 refills | Status: DC

## 2020-07-20 MED ORDER — OZEMPIC (0.25 OR 0.5 MG/DOSE) 2 MG/1.5ML ~~LOC~~ SOPN: PEN_INJECTOR | SUBCUTANEOUS | 4 refills | Status: DC

## 2020-07-20 NOTE — Assessment & Plan Note (Signed)
-  Symptoms are under better control with gabapentin 300 mg 2 capsules in the morning and 2 in the evening so will send new prescription. -We will continue to monitor

## 2020-07-20 NOTE — Progress Notes (Signed)
Established Patient Office Visit  Subjective:  Patient ID: [name], male    DOB: 09/22/1951  Age: 69 y.o. MRN: [id number]  CC:  Chief Complaint  Patient presents with  . Diabetes  . Hypertension  . Hyperlipidemia    HPI [name] presents for follow-up on diabetes mellitus, hyperlipidemia, and hypertension.  Diabetes: Pt denies increased urination or thirst. Pt reports medication compliance. No hypoglycemic events. Checking glucose at home. FBS 120s. Reports he took Ozempic for about 6 weeks until he ran out of refills and was not sure if he needed to request more or wait until his office visit.  Tolerated Ozempic without issues.  He has worked on his diet and decreased sweets such as doughnuts and carbohydrates.  He is eating cantaloupe and watermelon. He tries to stay active.  HLD: Pt taking medication as directed without issues. Denies side effects including myalgias and RUQ pain. He tries to monitor red meat.  HTN: Pt denies chest pain, palpitations, dizziness or lower extremity swelling. Taking medication as directed without side effects. Does not check blood pressure at home because his blood pressure has been stable for a while. Pt follows a low salt diet.  Neuropathy: Reports symptoms are stable.  Sometimes he does take 2 capsules in the morning instead of 1 which helps control his neuropathy better, and continues to take 2 capsules in the evening.   Past Medical History:  Diagnosis Date  . Abdominal aortic aneurysm (AAA) ([place])    3 cm per 07-02-18 US abdominal aorta US epic  . Arthritis   . Blood clot in vein    behind left knee  . Chronic kidney disease    renal calculi  . Chronic venous insufficiency LOWER EXTREMITIES  . Coronary artery disease CARDIOLOGIST - DR  [id number]-  LAST 1 WK AGO -- WILL REQUEST NOTE AND STRESS TEST  . Diabetes mellitus without complication ([place])    type 2  . Hematuria    last year  . Hyperglycemia   . Hyperlipidemia   .  Hypertension   . Mixed dyslipidemia   . Myocardial infarction ([place])   . Neuropathy    toes only  . Nocturia   . Prostate cancer ([place]) 08/18/11   gleason 8, volume 24.4cc  . S/P CABG x 4   . ST elevation MI (STEMI) ([place]) 02-10-2008   S/P CABG  . Stroke [facility]) 2016   occ trouble wriing with right hand  . Urinary hesitancy   . Vision abnormalities    resolved now  . Wound discharge    since jan 2019 goes to wound center [place] left great toe small open area changes dressing q 2 days with ointment provided by wound center, clear drainage occ    Past Surgical History:  Procedure Laterality Date  . CARDIAC CATHETERIZATION  05/01/2012   grafts widely patent  . CARDIOVASCULAR STRESS TEST  03-15-2010   INFERIOR WALL SCAR WITHOUT ANY MEANINGFUL ISCHEMIA/ EF 46% / LOW RISK SCAN  . CORONARY ARTERY BYPASS GRAFT  02-10-2008  DR Einar Gip   X4 VESSEL DISEASE / [facility] [address] CABG  . CYSTOSCOPY  02/08/2012   Procedure: CYSTOSCOPY FLEXIBLE;  Surgeon: Franchot Gallo, MD;  Location: [facility];  Service: Urology;;  . [place], URETEROSCOPY AND STENT PLACEMENT Right 09/12/2018   Procedure: CYSTOSCOPY WITH RETROGRADE PYELOGRAM, URETEROSCOPY AND STENT PLACEMENT, STONE EXTRACTION;  Surgeon: Franchot Gallo, MD;  Location: [facility];  Service: Urology;  Laterality: Right;  .  EP IMPLANTABLE DEVICE N/A 08/14/2016   Procedure: Loop Recorder Insertion;  Surgeon: Evans Lance, MD;  Location: [place] CV LAB;  Service: Cardiovascular;  Laterality: N/A;  . EXTRACORPOREAL SHOCK WAVE LITHOTRIPSY  2013  . HOLMIUM LASER APPLICATION Right 06/26/5008   Procedure: HOLMIUM LASER APPLICATION;  Surgeon: Franchot Gallo, MD;  Location: [facility];  Service: Urology;  Laterality: Right;  . KNEE SURGERY  age 34   RIGHT  . LEFT HEART CATHETERIZATION WITH CORONARY/GRAFT ANGIOGRAM N/A 05/01/2012   Procedure: LEFT HEART CATHETERIZATION WITH Beatrix Fetters;  Surgeon: Sanda Klein, MD;  Location: [place] CATH LAB;  Service: Cardiovascular;  Laterality: N/A;  . MULTIPLE TEETH EXTRACTIONS (23)/ FOUR QUADRANT ALVEOLPLASTY/ MANDIBULAR LATERAL EXOSTOSES REDUCTIONS  03-24-2010   CHRONIC PERIODONTITIS  . RADIOACTIVE SEED IMPLANT  02/08/2012   Procedure: RADIOACTIVE SEED IMPLANT;  Surgeon: Franchot Gallo, MD;  Location: [facility];  Service: Urology;  Laterality: N/A;  C-ARM   . SHOULDER SURGERY  1982   LEFT  . TEE WITHOUT CARDIOVERSION N/A 08/14/2016   Procedure: TRANSESOPHAGEAL ECHOCARDIOGRAM (TEE);  Surgeon: Josue Hector, MD;  Location: [facility] ENDOSCOPY;  Service: Cardiovascular;  Laterality: N/A;  . URETERAL STENT PLACEMENT      Family History  Problem Relation Age of Onset  . Cancer Father        pancreatic  . Diabetes Father     Social History   Socioeconomic History  . Marital status: Married    Spouse name: Not on file  . Number of children: Not on file  . Years of education: Not on file  . Highest education level: Not on file  Occupational History  . Not on file  Tobacco Use  . Smoking status: Current Every Day Smoker    Packs/day: 1.00    Years: 50.00    Pack years: 50.00    Types: Cigarettes  . Smokeless tobacco: Never Used  . Tobacco comment: PREVIOUSLY SMOKED 3PPD / DECREASED TO 1PPD SINCE 2009  Vaping Use  . Vaping Use: Never used  Substance and Sexual Activity  . Alcohol use: Not Currently    Alcohol/week: 2.0 standard drinks    Types: 1 Cans of beer, 1 Shots of liquor per week    Comment: none in 3 years  . Drug use: No  . Sexual activity: Never  Other Topics Concern  . Not on file  Social History Narrative  . Not on file   Social Determinants of Health   Financial Resource Strain:   . Difficulty of Paying Living Expenses:   Food Insecurity:   . Worried About Charity fundraiser in the Last Year:   . Arboriculturist in the Last Year:   Transportation Needs:   . Film/video editor (Medical):   Marland Kitchen Lack of  Transportation (Non-Medical):   Physical Activity:   . Days of Exercise per Week:   . Minutes of Exercise per Session:   Stress:   . Feeling of Stress :   Social Connections:   . Frequency of Communication with Friends and Family:   . Frequency of Social Gatherings with Friends and Family:   . Attends Religious Services:   . Active Member of Clubs or Organizations:   . Attends Archivist Meetings:   Marland Kitchen Marital Status:   Intimate Partner Violence:   . Fear of Current or Ex-Partner:   . Emotionally Abused:   Marland Kitchen Physically Abused:   . Sexually Abused:     Outpatient  Medications Prior to Visit  Medication Sig Dispense Refill  . atorvastatin (LIPITOR) 10 MG tablet Take 1 tablet (10 mg total) by mouth daily. 90 tablet 0  . Continuous Blood Gluc Receiver (FREESTYLE LIBRE 14 DAY READER) DEVI 1 Device by Does not apply route daily. Use to check blood sugars every morning and 2 hours after largest meals 1 Device 0  . Continuous Blood Gluc Sensor (FREESTYLE LIBRE 14 DAY SENSOR) MISC USE TO CHECK BLOOD SUGARS IN THE MORNING FASTING AND  2  HOURS  AFTER  LARGEST  MEAL 6 each 0  . ELIQUIS 5 MG TABS tablet TAKE 1 TABLET BY MOUTH TWICE DAILY(PLEASE SCHEDULE MD APPT FOR FURTHER REFILLS) 180 tablet 0  . finasteride (PROSCAR) 5 MG tablet Take 5 mg by mouth daily.  11  . glucose blood (FREESTYLE LITE) test strip Use to check blood sugars every morning fasting 100 each 12  . lisinopril (ZESTRIL) 5 MG tablet Take 1/2 (one-half) tablet by mouth once daily 45 tablet 2  . metFORMIN (GLUCOPHAGE-XR) 500 MG 24 hr tablet Take 4 tablets daily with breakfast. 360 tablet 0  . Multiple Vitamin (MULTIVITAMIN WITH MINERALS) TABS tablet Take 1 tablet by mouth daily. Men's One-A-Day Multivitamin    . oxybutynin (DITROPAN) 5 MG tablet Take 1 tablet (5 mg total) by mouth every 8 (eight) hours as needed for bladder spasms. 30 tablet 1  . tamsulosin (FLOMAX) 0.4 MG CAPS capsule Take 1 capsule (0.4 mg total) by mouth  at bedtime. 90 capsule 0  . WELLBUTRIN SR 150 MG 12 hr tablet Take 1 tablet (150 mg total) by mouth 2 (two) times daily. 60 tablet 11  . gabapentin (NEURONTIN) 300 MG capsule TAKE 1 CAPSULE BY MOUTH IN THE MORNING AND 2 IN THE EVENING AT  DINNER  TIME 270 capsule 0   No facility-administered medications prior to visit.    Allergies  Allergen Reactions  . Bee Venom Anaphylaxis  . Oysters [Shellfish Allergy] Swelling    Swelling of hands  . Penicillins Other (See Comments)    CAUSES "FREE BLEEDING" Has patient had a PCN reaction causing immediate rash, facial/tongue/throat swelling, SOB or lightheadedness with hypotension: No Has patient had a PCN reaction causing severe rash involving mucus membranes or skin necrosis: No Has patient had a PCN reaction that required hospitalization No Has patient had a PCN reaction occurring within the last 10 years: No If all of the above answers are "NO", then may proceed with Cephalosporin use.    ROS Review of Systems Review of Systems:  A fourteen system review of systems was performed and found to be positive as per HPI.  Objective:    Physical Exam General:  Well Developed, well nourished, appropriate for stated age.  Neuro:  Alert and oriented,  extra-ocular muscles intact  HEENT:  Normocephalic, atraumatic, neck supple, no carotid bruits appreciated  Skin:  no gross rash, warm, pink. Cardiac:  RRR, S1 S2 Respiratory:  ECTA B/L and A/P, Not using accessory muscles, speaking in full sentences- unlabored. Vascular:  Ext warm, no cyanosis apprec.; cap RF less 2 sec. Psych:  No HI/SI, judgement and insight good, Euthymic mood. Full Affect.   BP 117/70   Pulse 77   Temp 98.1 F (36.7 C) (Oral)   Ht 5\' 11"  (1.803 m)   Wt (!) 227 lb 4.8 oz (103.1 kg)   SpO2 96%   BMI 31.70 kg/m  Wt Readings from Last 3 Encounters:  07/20/20 (!) 227 lb 4.8 oz (  103.1 kg)  04/19/20 231 lb 4.8 oz (104.9 kg)  02/12/20 222 lb (100.7 kg)     Health  Maintenance Due  Topic Date Due  . PNA vac Low Risk Adult (2 of 2 - PPSV23) 11/27/2018  . OPHTHALMOLOGY EXAM  05/04/2020    There are no preventive care reminders to display for this patient.  Lab Results  Component Value Date   TSH 1.710 09/02/2019   Lab Results  Component Value Date   WBC 12.8 (H) 09/02/2019   HGB 13.6 09/02/2019   HCT 40.7 09/02/2019   MCV 84 09/02/2019   PLT 222 09/02/2019   Lab Results  Component Value Date   NA 140 09/02/2019   K 4.0 09/02/2019   CO2 21 09/02/2019   GLUCOSE 133 (H) 09/02/2019   BUN 10 09/02/2019   CREATININE 0.72 (L) 09/02/2019   BILITOT 0.3 09/02/2019   ALKPHOS 130 (H) 09/02/2019   AST 20 09/02/2019   ALT 22 09/02/2019   PROT 6.9 09/02/2019   ALBUMIN 4.4 09/02/2019   CALCIUM 9.4 09/02/2019   ANIONGAP 11 09/04/2018   Lab Results  Component Value Date   CHOL 75 (L) 09/02/2019   Lab Results  Component Value Date   HDL 30 (L) 09/02/2019   Lab Results  Component Value Date   LDLCALC 25 09/02/2019   Lab Results  Component Value Date   TRIG 106 09/02/2019   Lab Results  Component Value Date   CHOLHDL 2.5 09/02/2019   Lab Results  Component Value Date   HGBA1C 7.6 (A) 07/20/2020      Assessment & Plan:   Problem List Items Addressed This Visit      Cardiovascular and Mediastinum   Hypertension associated with diabetes ([place])    - BP today is 117/70 HR 77, at goal. - Continue Lisinopril 5 mg  - Encourage ambulatory BP and pulse monitoring, especially when not feeling well. - Continue DASH diet. - Encourage to stay as active as possible.       Relevant Medications   Semaglutide,0.25 or 0.5MG /DOS, (OZEMPIC, 0.25 OR 0.5 MG/DOSE,) 2 MG/1.5ML SOPN     Endocrine   Diabetes mellitus type 2 in obese (H[facility]) - Primary    - A1c today is 7.6, stable - Restarted Ozempic given patient tolerated medication well and will continue metformin 500 mg 4 tablets po in am. -Continue ambulatory glucose monitoring and notify  clinic if FBS consistently <80 or >160. - Continue low glucose/carbohydrate diet. -Foot exam performed today, no ulcers present and evidence of neuropathy noted. -Encouraged to continue with daily foot checks/care. -Plan to check CMP at next OV for medication monitoring.      Relevant Medications   Semaglutide,0.25 or 0.5MG /DOS, (OZEMPIC, 0.25 OR 0.5 MG/DOSE,) 2 MG/1.5ML SOPN   Other Relevant Orders   POCT glycosylated hemoglobin (Hb A1C) (Completed)   Mixed diabetic hyperlipidemia associated with type 2 diabetes mellitus (HCC)    - Last lipid panel: Total cholesterol and HDL decreased - Continue atorvastatin 10 mg - Follow heart healthy diet. - Plan to recheck lipid panel and hepatic function next OV in 3 months.      Relevant Medications   Semaglutide,0.25 or 0.5MG /DOS, (OZEMPIC, 0.25 OR 0.5 MG/DOSE,) 2 MG/1.5ML SOPN   Diabetic polyneuropathy associated with type 2 diabetes mellitus ([place])    -Symptoms are under better control with gabapentin 300 mg 2 capsules in the morning and 2 in the evening so will send new prescription. -We will continue to monitor  Relevant Medications   gabapentin (NEURONTIN) 300 MG capsule   Semaglutide,0.25 or 0.5MG /DOS, (OZEMPIC, 0.25 OR 0.5 MG/DOSE,) 2 MG/1.5ML SOPN     Other   Increased urinary frequency    -Advised patient to follow-up with urologist to address urinary concerns. -On finasteride, oxybutynin, and Flomax         Meds ordered this encounter  Medications  . gabapentin (NEURONTIN) 300 MG capsule    Sig: TAKE 2 CAPSULE BY MOUTH IN THE MORNING AND 2 IN THE EVENING AT  DINNER  TIME    Dispense:  360 capsule    Refill:  0    Order Specific Question:   Supervising Provider    Answer:   Beatrice Lecher D [[id number]]  . Semaglutide,0.25 or 0.5MG /DOS, (OZEMPIC, 0.25 OR 0.5 MG/DOSE,) 2 MG/1.5ML SOPN    Sig: Inject 0.25 mg into the skin once weekly x 14 days. Then inject 0.5 mg once weekly.    Dispense:  5 pen    Refill:  4     Order Specific Question:   Supervising Provider    Answer:   Beatrice Lecher D [[id number]]    Follow-up: Return in about 3 months (around 10/20/2020) for DM, HLD, HTN.   Note:  This note was prepared with assistance of Dragon voice recognition software. Occasional wrong-word or sound-a-like substitutions may have occurred due to the inherent limitations of voice recognition software.   [name], PA-C

## 2020-07-20 NOTE — Telephone Encounter (Signed)
Patient calling the office for samples of medication:   1.  What medication and dosage are you requesting samples for? ELIQUIS 5 MG TABS tablet  2.  Are you currently out of this medication? Only has two tablets left  Cannot afford prescription.

## 2020-07-20 NOTE — Assessment & Plan Note (Addendum)
-   A1c today is 7.6, stable - Restarted Ozempic given patient tolerated medication well and will continue metformin 500 mg 4 tablets po in am. -Continue ambulatory glucose monitoring and notify clinic if FBS consistently <80 or >160. - Continue low glucose/carbohydrate diet. -Foot exam performed today, no ulcers present and evidence of neuropathy noted. -Encouraged to continue with daily foot checks/care. -Plan to check CMP at next OV for medication monitoring.

## 2020-07-20 NOTE — Patient Instructions (Signed)

## 2020-07-20 NOTE — Assessment & Plan Note (Addendum)
-   BP today is 117/70 HR 77, at goal. - Continue Lisinopril 5 mg  - Encourage ambulatory BP and pulse monitoring, especially when not feeling well. - Continue DASH diet. - Encourage to stay as active as possible.

## 2020-07-20 NOTE — Telephone Encounter (Signed)
Spoke with pt, aware no samples available. He reports he does not qualify for patient assistance with [employer]MS but his insurance has a program he is going to get signed up for. He is going to talk to the pharmacy about getting a partial refill until he can get insurance help.

## 2020-07-20 NOTE — Assessment & Plan Note (Addendum)
-   Last lipid panel: Total cholesterol and HDL decreased - Continue atorvastatin 10 mg - Follow heart healthy diet. - Plan to recheck lipid panel and hepatic function next OV in 3 months.

## 2020-07-20 NOTE — Assessment & Plan Note (Signed)
-  Advised patient to follow-up with urologist to address urinary concerns. -On finasteride, oxybutynin, and Flomax

## 2020-08-05 ENCOUNTER — Other Ambulatory Visit: Payer: Self-pay | Admitting: Physician Assistant

## 2020-08-18 DIAGNOSIS — H25812 Combined forms of age-related cataract, left eye: Secondary | ICD-10-CM | POA: Diagnosis not present

## 2020-08-18 DIAGNOSIS — H2512 Age-related nuclear cataract, left eye: Secondary | ICD-10-CM | POA: Diagnosis not present

## 2020-08-23 ENCOUNTER — Other Ambulatory Visit: Payer: Self-pay | Admitting: Adult Health

## 2020-08-23 ENCOUNTER — Other Ambulatory Visit: Payer: Self-pay | Admitting: Physician Assistant

## 2020-09-08 DIAGNOSIS — H25811 Combined forms of age-related cataract, right eye: Secondary | ICD-10-CM | POA: Diagnosis not present

## 2020-09-08 DIAGNOSIS — H2511 Age-related nuclear cataract, right eye: Secondary | ICD-10-CM | POA: Diagnosis not present

## 2020-10-04 ENCOUNTER — Other Ambulatory Visit: Payer: Self-pay

## 2020-10-04 ENCOUNTER — Encounter: Payer: Self-pay | Admitting: *Deleted

## 2020-10-04 ENCOUNTER — Ambulatory Visit
Admission: EM | Admit: 2020-10-04 | Discharge: 2020-10-04 | Disposition: A | Discharge status: Home / Self Care | Payer: PPO | Attending: Emergency Medicine | Admitting: Emergency Medicine

## 2020-10-04 DIAGNOSIS — Z5189 Encounter for other specified aftercare: Secondary | ICD-10-CM

## 2020-10-04 DIAGNOSIS — L03119 Cellulitis of unspecified part of limb: Secondary | ICD-10-CM | POA: Diagnosis not present

## 2020-10-04 LAB — POCT FASTING CBG KUC MANUAL ENTRY: POCT Glucose (KUC): 139 mg/dL — AB (ref 70–99)

## 2020-10-04 MED ORDER — CEPHALEXIN 500 MG PO CAPS: 500.0000 mg | ORAL_CAPSULE | Freq: Four times a day (QID) | ORAL | 0 refills | Status: AC

## 2020-10-04 NOTE — ED Provider Notes (Signed)
[name]    CSN: [id number] Arrival date & time: 10/04/20  0940      History   Chief Complaint Chief Complaint  Patient presents with   Wound Check    HPI DANDRAE KUSTRA is a 69 y.o. male history of DM type II, CAD, venous insufficiency, AAA, presenting today for evaluation of abrasions to her lower legs.  Reports approximately 1 week ago bumped into something in his shop and caused abrasions to his lower legs.  Reports of recently he has noticed a small amount of drainage from each side.  Denies fevers.  Denies significant pain.  He does report he has diabetic neuropathy.  Reports sugars of recently have been between 100-1 50.  HPI  Past Medical History:  Diagnosis Date   Abdominal aortic aneurysm (AAA) ([place])    3 cm per 07-02-18 US abdominal aorta US epic   Arthritis    Blood clot in vein    behind left knee   Chronic kidney disease    renal calculi   Chronic venous insufficiency LOWER EXTREMITIES   Coronary artery disease CARDIOLOGIST - DR  [id number]-  LAST 1 WK AGO -- WILL REQUEST NOTE AND STRESS TEST   Diabetes mellitus without complication (HCC)    type 2   Hematuria    last year   Hyperglycemia    Hyperlipidemia    Hypertension    Mixed dyslipidemia    Myocardial infarction (HCC)    Neuropathy    toes only   Nocturia    Prostate cancer ([place]) 08/18/11   gleason 8, volume 24.4cc   S/P CABG x 4    ST elevation MI (STEMI) ([place]) 02-10-2008   S/P CABG   Stroke ([place]) 2016   occ trouble wriing with right hand   Urinary hesitancy    Vision abnormalities    resolved now   Wound discharge    since jan 2019 goes to wound center [facility] left great toe small open area changes dressing q 2 days with ointment provided by wound center, clear drainage occ    Patient Active Problem List   Diagnosis Date Noted   Microalbuminuria due to type 2 diabetes mellitus ([place]) 04/19/2020   Mixed diabetic hyperlipidemia associated with  type 2 diabetes mellitus ([place]) 04/19/2020   Diabetic polyneuropathy associated with type 2 diabetes mellitus ([place]) 04/19/2020   Wound of left leg 09/11/2019   Tick bite of right lower leg 04/21/2019   Dyslipidemia (high LDL; low HDL) 01/28/2019   PAD (peripheral artery disease) ([place]) 01/28/2019   Great toe pain, left 10/21/2018   Encounter for Medicare annual wellness exam 06/20/2018   Laceration of left lower leg 04/17/2018   Laceration of skin of left lower leg 04/01/2018   Increased urinary frequency 04/01/2018   Hematuria 01/30/2018   Puncture wound of toe of left foot 01/07/2018   At risk for diabetic foot ulcer 01/07/2018   Need for pneumococcal vaccination 11/27/2017   Screening for colon cancer 11/27/2017   Healthcare maintenance 11/27/2017   Paroxysmal atrial fibrillation ([place]) 11/22/2016   History of embolic stroke 40/97/3532   Diabetes mellitus type 2 in obese ([place]) 11/22/2016   Venous insufficiency of both lower extremities 09/21/2016   Stroke ([place]) 08/12/2016   Hyperglycemia    Hypertension associated with diabetes ([place])    Vision, loss, sudden    CVA (cerebral infarction) 08/11/2016   CAD - inferior MI 2009, CABG 2009, patent grafts cath 04/2012 09/23/2013   Morbid obesity ([place])  09/23/2013   Hyperlipidemia 09/23/2013   Tobacco abuse 09/23/2013   Prostate cancer ([place]) 08/18/2011   Myocardial infarction [facility])     Past Surgical History:  Procedure Laterality Date   CARDIAC CATHETERIZATION  05/01/2012   grafts widely patent   CARDIOVASCULAR STRESS TEST  03-15-2010   INFERIOR WALL SCAR WITHOUT ANY MEANINGFUL ISCHEMIA/ EF 46% / LOW RISK SCAN   CORONARY ARTERY BYPASS GRAFT  02-10-2008  DR Einar Gip   X4 VESSEL DISEASE / EMERGANT CABG   CYSTOSCOPY  02/08/2012   Procedure: CYSTOSCOPY FLEXIBLE;  Surgeon: Franchot Gallo, MD;  Location: [facility];  Service: Urology;;   CYSTOSCOPY WITH RETROGRADE PYELOGRAM, URETEROSCOPY AND  STENT PLACEMENT Right 09/12/2018   Procedure: CYSTOSCOPY WITH RETROGRADE PYELOGRAM, URETEROSCOPY AND STENT PLACEMENT, STONE EXTRACTION;  Surgeon: Franchot Gallo, MD;  Location: [facility];  Service: Urology;  Laterality: Right;   EP IMPLANTABLE DEVICE N/A 08/14/2016   Procedure: Loop Recorder Insertion;  Surgeon: Evans Lance, MD;  Location: [place] [facility];  Service: Cardiovascular;  Laterality: N/A;   EXTRACORPOREAL SHOCK WAVE LITHOTRIPSY  2013   HOLMIUM LASER APPLICATION Right 1/44/3154   Procedure: HOLMIUM LASER APPLICATION;  Surgeon: Franchot Gallo, MD;  Location: [facility];  Service: Urology;  Laterality: Right;   KNEE SURGERY  age 31   RIGHT   LEFT HEART CATHETERIZATION WITH CORONARY/GRAFT ANGIOGRAM N/A 05/01/2012   Procedure: LEFT HEART CATHETERIZATION WITH Beatrix Fetters;  Surgeon: Sanda Klein, MD;  Location: [place] [facility];  Service: Cardiovascular;  Laterality: N/A;   MULTIPLE TEETH EXTRACTIONS (23)/ FOUR QUADRANT ALVEOLPLASTY/ MANDIBULAR LATERAL EXOSTOSES REDUCTIONS  03-24-2010   CHRONIC PERIODONTITIS   RADIOACTIVE SEED IMPLANT  02/08/2012   Procedure: RADIOACTIVE SEED IMPLANT;  Surgeon: Franchot Gallo, MD;  Location: [facility];  Service: Urology;  Laterality: N/A;  C-ARM    SHOULDER SURGERY  1982   LEFT   TEE WITHOUT CARDIOVERSION N/A 08/14/2016   Procedure: TRANSESOPHAGEAL ECHOCARDIOGRAM (TEE);  Surgeon: Josue Hector, MD;  Location: [facility] ENDOSCOPY;  Service: Cardiovascular;  Laterality: N/A;   URETERAL STENT PLACEMENT         Home Medications    Prior to Admission medications   Medication Sig Start Date End Date Taking? Authorizing Provider  atorvastatin (LIPITOR) 10 MG tablet Take 1 tablet (10 mg total) by mouth daily. 05/20/20  Yes [name], PA-C  Continuous Blood Gluc Receiver (FREESTYLE LIBRE 14 DAY READER) DEVI 1 Device by Does not apply route daily. Use to check blood sugars every morning and 2 hours after largest meals  06/20/18  Yes Danford, Valetta Fuller D, NP  Continuous Blood Gluc Sensor (FREESTYLE LIBRE 14 DAY SENSOR) MISC USE TO CHECK BLOOD SUGARS IN THE MORNING FASTING AND  2  HOURS  AFTER  LARGEST  MEAL 11/27/19  Yes Danford, Katy D, NP  ELIQUIS 5 MG TABS tablet TAKE 1 TABLET BY MOUTH TWICE DAILY(PLEASE SCHEDULE MD APPT FOR FURTHER REFILLS) 07/20/20  Yes Croitoru, Mihai, MD  finasteride (PROSCAR) 5 MG tablet Take 5 mg by mouth daily. 06/26/18  Yes [provider]  gabapentin (NEURONTIN) 300 MG capsule TAKE 2 CAPSULE BY MOUTH IN THE MORNING AND 2 IN THE EVENING AT  DINNER  TIME 07/20/20  Yes Abonza, Maritza, PA-C  glucose blood (FREESTYLE LITE) test strip Use to check blood sugars every morning fasting 04/02/18  Yes Danford, Valetta Fuller D, NP  lisinopril (ZESTRIL) 5 MG tablet Take 1/2 (one-half) tablet by mouth once daily 04/12/20  Yes Croitoru, Dani Gobble, MD  metFORMIN (  GLUCOPHAGE-XR) 500 MG 24 hr tablet TAKE 4 TABLETS BY MOUTH ONCE DAILY WITH BREAKFAST 08/05/20  Yes Abonza, Maritza, PA-C  Multiple Vitamin (MULTIVITAMIN WITH MINERALS) TABS tablet Take 1 tablet by mouth daily. Men's One-A-Day Multivitamin   Yes [provider]  oxybutynin (DITROPAN) 5 MG tablet Take 1 tablet (5 mg total) by mouth every 8 (eight) hours as needed for bladder spasms. 09/12/18  Yes Dahlstedt, Annie Main, MD  tamsulosin [facility]) 0.4 MG CAPS capsule Take 1 capsule by mouth once daily at bedtime 08/23/20  Yes Abonza, Maritza, PA-C  cephALEXin (KEFLEX) 500 MG capsule Take 1 capsule (500 mg total) by mouth 4 (four) times daily for 5 days. 10/04/20 10/09/20  Jlynn Langille C, PA-C  Semaglutide,0.25 or 0.5MG /DOS, (OZEMPIC, 0.25 OR 0.5 MG/DOSE,) 2 MG/1.5ML SOPN Inject 0.25 mg into the skin once weekly x 14 days. Then inject 0.5 mg once weekly. 07/20/20   Abonza, Maritza, PA-C  WELLBUTRIN SR 150 MG 12 hr tablet Take 1 tablet (150 mg total) by mouth 2 (two) times daily. 02/12/20   Croitoru, Mihai, MD    Family History Family History  Problem Relation Age  of Onset   Cancer Father        pancreatic   Diabetes Father     Social History Social History   Tobacco Use   Smoking status: Current Every Day Smoker    Packs/day: 1.00    Years: 50.00    Pack years: 50.00    Types: Cigarettes   Smokeless tobacco: Never Used   Tobacco comment: PREVIOUSLY SMOKED 3PPD / DECREASED TO 1PPD SINCE 2009  Vaping Use   Vaping Use: Never used  Substance Use Topics   Alcohol use: Not Currently    Alcohol/week: 2.0 standard drinks    Types: 1 Cans of beer, 1 Shots of liquor per week    Comment: none in 3 years   Drug use: No     Allergies   Bee venom, Oysters [shellfish allergy], and Penicillins   Review of Systems Review of Systems  Constitutional: Negative for fatigue and fever.  Eyes: Negative for redness, itching and visual disturbance.  Respiratory: Negative for shortness of breath.   Cardiovascular: Negative for chest pain and leg swelling.  Gastrointestinal: Negative for nausea and vomiting.  Musculoskeletal: Negative for arthralgias and myalgias.  Skin: Positive for wound. Negative for color change and rash.  Neurological: Negative for dizziness, syncope, weakness, light-headedness and headaches.     Physical Exam Triage Vital Signs ED Triage Vitals  Enc Vitals Group     BP 10/04/20 1019 (!) 161/83     Pulse Rate 10/04/20 1019 77     Resp 10/04/20 1019 18     Temp 10/04/20 1019 (!) 97.5 F (36.4 C)     Temp Source 10/04/20 1019 Oral     SpO2 10/04/20 1019 94 %     Weight 10/04/20 1023 227 lb (103 kg)     Height 10/04/20 1023 5\' 11"  (1.803 m)     Head Circumference --      Peak Flow --      Pain Score --      Pain Loc --      Pain Edu? --      Excl. in [place]? --    No data found.  Updated Vital Signs BP (!) 161/83 (BP Location: Right Arm)    Pulse 77    Temp (!) 97.5 F (36.4 C) (Oral)    Resp 18    Ht  5\' 11"  (1.803 m)    Wt 227 lb (103 kg)    SpO2 94%    BMI 31.66 kg/m   Visual Acuity Right Eye Distance:     Left Eye Distance:   Bilateral Distance:    Right Eye Near:   Left Eye Near:    Bilateral Near:     Physical Exam Vitals and nursing note reviewed.  Constitutional:      Appearance: He is well-developed.     Comments: No acute distress  HENT:     Head: Normocephalic and atraumatic.     Nose: Nose normal.  Eyes:     Conjunctiva/sclera: Conjunctivae normal.  Cardiovascular:     Rate and Rhythm: Normal rate.  Pulmonary:     Effort: Pulmonary effort is normal. No respiratory distress.  Abdominal:     General: There is no distension.  Musculoskeletal:        General: Normal range of motion.     Cervical back: Neck supple.     Comments: Dorsalis pedis 2+ bilaterally  Skin:    General: Skin is warm and dry.     Comments: Bilateral anterior shins with superficial abrasions noted, with small amount of serosanguineous fluid, mild surrounding erythema, wound area to right lower leg with small amount of pustular drainage  Left axilla with area of erythema and induration, no fluctuance  Neurological:     Mental Status: He is alert and oriented to person, place, and time.      UC Treatments / Results  Labs (all labs ordered are listed, but only abnormal results are displayed) Labs Reviewed  POCT FASTING CBG [facility] MANUAL ENTRY - Abnormal; Notable for the following components:      Result Value   POCT Glucose ([facility]) 139 (*)    All other components within normal limits    EKG   Radiology No results found.  Procedures Procedures (including critical care time)  Medications Ordered in UC Medications - No data to display  Initial Impression / Assessment and Plan / UC Course  I have reviewed the triage vital signs and the nursing notes.  Pertinent labs & imaging results that were available during my care of the patient were reviewed by me and considered in my medical decision making (see chart for details).     Superficial abrasions initiating Keflex to treat for  cellulitis/infection associated with these wounds, discussed wound care to keep clean and dry and elevate help with circulation.  Has small early abscess to left axilla area, continue Keflex and warm compresses.  Discussed strict return precautions. Patient verbalized understanding and is agreeable with plan.  Final Clinical Impressions(s) / UC Diagnoses   Final diagnoses:  Visit for wound check  Cellulitis of lower extremity, unspecified laterality     Discharge Instructions     Begin Keflex 4 times a day for the next 5 days Keep wounds clean and dry Warm compresses to area under left armpit Elevate legs when you are able Follow-up if any areas not healing or developing increased pain swelling redness or drainage    ED Prescriptions    Medication Sig Dispense Auth. Provider   cephALEXin (KEFLEX) 500 MG capsule Take 1 capsule (500 mg total) by mouth 4 (four) times daily for 5 days. 20 capsule Leviathan Macera, Scotia C, PA-C     PDMP not reviewed this encounter.   Janith Lima, PA-C 10/04/20 1056

## 2020-10-04 NOTE — Discharge Instructions (Signed)
Begin Keflex 4 times a day for the next 5 days Keep wounds clean and dry Warm compresses to area under left armpit Elevate legs when you are able Follow-up if any areas not healing or developing increased pain swelling redness or drainage

## 2020-10-04 NOTE — ED Notes (Signed)
CBG 139. 

## 2020-10-04 NOTE — ED Triage Notes (Signed)
Pt reports last Tuesday he bumped into something and now has an open skin to Lt/RT lower anterior legs. Small amount of drainage from each site.Pt reports he is also a diabetic and is worried about infection .

## 2020-10-13 ENCOUNTER — Other Ambulatory Visit: Payer: Self-pay

## 2020-10-13 ENCOUNTER — Ambulatory Visit
Admission: EM | Admit: 2020-10-13 | Discharge: 2020-10-13 | Disposition: A | Discharge status: Home / Self Care | Payer: PPO | Attending: Physician Assistant | Admitting: Physician Assistant

## 2020-10-13 DIAGNOSIS — Z5189 Encounter for other specified aftercare: Secondary | ICD-10-CM

## 2020-10-13 DIAGNOSIS — M7989 Other specified soft tissue disorders: Secondary | ICD-10-CM

## 2020-10-13 MED ORDER — FUROSEMIDE 20 MG PO TABS
20.0000 mg | ORAL_TABLET | Freq: Every day | ORAL | 0 refills | Status: DC
Start: 2020-10-13 — End: 2021-08-16

## 2020-10-13 NOTE — ED Triage Notes (Signed)
Pt states needs wound looked at and is curious about whether he needs more antibiotics. Pt states he has experienced drainage when wearing compression socks with the wounds. Pt is aox4 ambulatory.

## 2020-10-13 NOTE — Discharge Instructions (Signed)
No signs of infection to the wound. Lasix 20-40mg  daily to help with leg swelling, this may help with the healing process. Please wear your compression stockings during the day to help with swelling, and elevate it daily. Limit salt intake. Monitor for increased swelling, increased warmth, redness, fever, follow up for reevaluation. Otherwise, follow up with PCP/wound care for further evaluation.

## 2020-10-13 NOTE — ED Provider Notes (Signed)
[name]    CSN: [id number] Arrival date & time: 10/13/20  6734      History   Chief Complaint Chief Complaint  Patient presents with  . Wound Check    HPI [name] is a 69 y.o. male.   69 year old male comes in for wound recheck.  Was seen 10/04/2020 for similar, at the time, had some erythema around the wound with drainage, and was put on Keflex for 7 days.  Patient has finished course, but given location of wound states he has not been able to check erythema.  Denies any fevers.  He notices some drainage to the wound when wearing compression stockings.  Worries that he will need more antibiotics.  Has had intermittent pain to the area.     Past Medical History:  Diagnosis Date  . Abdominal aortic aneurysm (AAA) ([place])    3 cm per 07-02-18 US abdominal aorta US epic  . Arthritis   . Blood clot in vein    behind left knee  . Chronic kidney disease    renal calculi  . Chronic venous insufficiency LOWER EXTREMITIES  . Coronary artery disease CARDIOLOGIST - DR  [id number]  LAST 1 WK AGO -- WILL REQUEST NOTE AND STRESS TEST  . Diabetes mellitus without complication ([place])    type 2  . Hematuria    last year  . Hyperglycemia   . Hyperlipidemia   . Hypertension   . Mixed dyslipidemia   . Myocardial infarction ([place])   . Neuropathy    toes only  . Nocturia   . Prostate cancer ([place]) 08/18/11   gleason 8, volume 24.4cc  . S/P CABG x 4   . ST elevation MI (STEMI) ([place]) 02-10-2008   S/P CABG  . Stroke [facility]) 2016   occ trouble wriing with right hand  . Urinary hesitancy   . Vision abnormalities    resolved now  . Wound discharge    since jan 2019 goes to wound center [facility] left great toe small open area changes dressing q 2 days with ointment provided by wound center, clear drainage occ    Patient Active Problem List   Diagnosis Date Noted  . Microalbuminuria due to type 2 diabetes mellitus ([place]) 04/19/2020  . Mixed diabetic hyperlipidemia  associated with type 2 diabetes mellitus ([place]) 04/19/2020  . Diabetic polyneuropathy associated with type 2 diabetes mellitus ([place]) 04/19/2020  . Wound of left leg 09/11/2019  . Tick bite of right lower leg 04/21/2019  . Dyslipidemia (high LDL; low HDL) 01/28/2019  . PAD (peripheral artery disease) ([place]) 01/28/2019  . Great toe pain, left 10/21/2018  . Encounter for Medicare annual wellness exam 06/20/2018  . Laceration of left lower leg 04/17/2018  . Laceration of skin of left lower leg 04/01/2018  . Increased urinary frequency 04/01/2018  . Hematuria 01/30/2018  . Puncture wound of toe of left foot 01/07/2018  . At risk for diabetic foot ulcer 01/07/2018  . Need for pneumococcal vaccination 11/27/2017  . Screening for colon cancer 11/27/2017  . Healthcare maintenance 11/27/2017  . Paroxysmal atrial fibrillation ([place]) 11/22/2016  . History of embolic stroke 09/73/5329  . Diabetes mellitus type 2 in obese ([place]) 11/22/2016  . Venous insufficiency of both lower extremities 09/21/2016  . Stroke ([place]) 08/12/2016  . Hyperglycemia   . Hypertension associated with diabetes ([place])   . Vision, loss, sudden   . CVA (cerebral infarction) 08/11/2016  . CAD - inferior MI 2009, CABG 2009,  patent grafts cath 04/2012 09/23/2013  . Morbid obesity ([place]) 09/23/2013  . Hyperlipidemia 09/23/2013  . Tobacco abuse 09/23/2013  . Prostate cancer ([place]) 08/18/2011  . Myocardial infarction [facility])     Past Surgical History:  Procedure Laterality Date  . CARDIAC CATHETERIZATION  05/01/2012   grafts widely patent  . CARDIOVASCULAR STRESS TEST  03-15-2010   INFERIOR WALL SCAR WITHOUT ANY MEANINGFUL ISCHEMIA/ EF 46% / LOW RISK SCAN  . CORONARY ARTERY BYPASS GRAFT  02-10-2008  DR Einar Gip   X4 VESSEL DISEASE / [facility] CABG  . CYSTOSCOPY  02/08/2012   Procedure: CYSTOSCOPY FLEXIBLE;  Surgeon: Franchot Gallo, MD;  Location: [facility];  Service: Urology;;  . [place],  URETEROSCOPY AND STENT PLACEMENT Right 09/12/2018   Procedure: CYSTOSCOPY WITH RETROGRADE PYELOGRAM, URETEROSCOPY AND STENT PLACEMENT, STONE EXTRACTION;  Surgeon: Franchot Gallo, MD;  Location: [facility];  Service: Urology;  Laterality: Right;  . EP IMPLANTABLE DEVICE N/A 08/14/2016   Procedure: Loop Recorder Insertion;  Surgeon: Evans Lance, MD;  Location: [facility];  Service: Cardiovascular;  Laterality: N/A;  . EXTRACORPOREAL SHOCK WAVE LITHOTRIPSY  2013  . HOLMIUM LASER APPLICATION Right 9/38/1017   Procedure: HOLMIUM LASER APPLICATION;  Surgeon: Franchot Gallo, MD;  Location: [facility];  Service: Urology;  Laterality: Right;  . KNEE SURGERY  age 81   RIGHT  . LEFT HEART CATHETERIZATION WITH CORONARY/GRAFT ANGIOGRAM N/A 05/01/2012   Procedure: LEFT HEART CATHETERIZATION WITH Beatrix Fetters;  Surgeon: Sanda Klein, MD;  Location: [facility];  Service: Cardiovascular;  Laterality: N/A;  . MULTIPLE TEETH EXTRACTIONS (23)/ FOUR QUADRANT ALVEOLPLASTY/ MANDIBULAR LATERAL EXOSTOSES REDUCTIONS  03-24-2010   CHRONIC PERIODONTITIS  . RADIOACTIVE SEED IMPLANT  02/08/2012   Procedure: RADIOACTIVE SEED IMPLANT;  Surgeon: Franchot Gallo, MD;  Location[facility];  Service: Urology;  Laterality: N/A;  C-ARM   . SHOULDER SURGERY  1982   LEFT  . TEE WITHOUT CARDIOVERSION N/A 08/14/2016   Procedure: TRANSESOPHAGEAL ECHOCARDIOGRAM (TEE);  Surgeon: Josue Hector, MD;  Location: [facility] ENDOSCOPY;  Service: Cardiovascular;  Laterality: N/A;  . URETERAL STENT PLACEMENT         Home Medications    Prior to Admission medications   Medication Sig Start Date End Date Taking? Authorizing Provider  atorvastatin (LIPITOR) 10 MG tablet Take 1 tablet (10 mg total) by mouth daily. 05/20/20  Yes [name], PA-C  Continuous Blood Gluc Receiver (FREESTYLE LIBRE 14 DAY READER) DEVI 1 Device by Does not apply route daily. Use to check blood sugars every morning and 2 hours  after largest meals 06/20/18  Yes Danford, Valetta Fuller D, NP  Continuous Blood Gluc Sensor (FREESTYLE LIBRE 14 DAY SENSOR) MISC USE TO CHECK BLOOD SUGARS IN THE MORNING FASTING AND  2  HOURS  AFTER  LARGEST  MEAL 11/27/19  Yes Danford, Katy D, NP  ELIQUIS 5 MG TABS tablet TAKE 1 TABLET BY MOUTH TWICE DAILY(PLEASE SCHEDULE MD APPT FOR FURTHER REFILLS) 07/20/20  Yes Croitoru, Mihai, MD  gabapentin (NEURONTIN) 300 MG capsule TAKE 2 CAPSULE BY MOUTH IN THE MORNING AND 2 IN THE EVENING AT  DINNER  TIME 07/20/20  Yes Abonza, Maritza, PA-C  glucose blood (FREESTYLE LITE) test strip Use to check blood sugars every morning fasting 04/02/18  Yes Danford, Katy D, NP  lisinopril (ZESTRIL) 5 MG tablet Take 1/2 (one-half) tablet by mouth once daily 04/12/20  Yes Croitoru, Mihai, MD  metFORMIN (GLUCOPHAGE-XR) 500 MG 24 hr tablet TAKE 4  TABLETS BY MOUTH ONCE DAILY WITH BREAKFAST 08/05/20  Yes Abonza, Maritza, PA-C  Multiple Vitamin (MULTIVITAMIN WITH MINERALS) TABS tablet Take 1 tablet by mouth daily. Men's One-A-Day Multivitamin   Yes [provider]  oxybutynin (DITROPAN) 5 MG tablet Take 1 tablet (5 mg total) by mouth every 8 (eight) hours as needed for bladder spasms. 09/12/18  Yes Dahlstedt, Annie Main, MD  Semaglutide,0.25 or 0.5MG /DOS, (OZEMPIC, 0.25 OR 0.5 MG/DOSE,) 2 MG/1.5ML SOPN Inject 0.25 mg into the skin once weekly x 14 days. Then inject 0.5 mg once weekly. 07/20/20  Yes [name], PA-C  tamsulosin (FLOMAX) 0.4 MG CAPS capsule Take 1 capsule by mouth once daily at bedtime 08/23/20  Yes Abonza, Maritza, PA-C  WELLBUTRIN SR 150 MG 12 hr tablet Take 1 tablet (150 mg total) by mouth 2 (two) times daily. 02/12/20  Yes Croitoru, Mihai, MD  finasteride (PROSCAR) 5 MG tablet Take 5 mg by mouth daily. 06/26/18   [provider]  furosemide (LASIX) 20 MG tablet Take 1 tablet (20 mg total) by mouth daily. 10/13/20   Ok Edwards, PA-C    Family History Family History  Problem Relation Age of Onset  . Cancer  Father        pancreatic  . Diabetes Father     Social History Social History   Tobacco Use  . Smoking status: Current Every Day Smoker    Packs/day: 1.00    Years: 50.00    Pack years: 50.00    Types: Cigarettes  . Smokeless tobacco: Never Used  . Tobacco comment: PREVIOUSLY SMOKED 3PPD / DECREASED TO 1PPD SINCE 2009  Vaping Use  . Vaping Use: Never used  Substance Use Topics  . Alcohol use: Not Currently    Alcohol/week: 2.0 standard drinks    Types: 1 Cans of beer, 1 Shots of liquor per week    Comment: none in 3 years  . Drug use: No     Allergies   Bee venom, Oysters [shellfish allergy], and Penicillins   Review of Systems Review of Systems  Reason unable to perform ROS: See HPI as above.     Physical Exam Triage Vital Signs ED Triage Vitals  Enc Vitals Group     BP 10/13/20 0829 132/86     Pulse Rate 10/13/20 0829 91     Resp 10/13/20 0829 18     Temp 10/13/20 0829 98.3 F (36.8 C)     Temp Source 10/13/20 0829 Oral     SpO2 10/13/20 0829 98 %     Weight --      Height --      Head Circumference --      Peak Flow --      Pain Score 10/13/20 0830 5     Pain Loc --      Pain Edu? --      Excl. in [place]? --    No data found.  Updated Vital Signs BP 132/86 (BP Location: Left Arm)   Pulse 91   Temp 98.3 F (36.8 C) (Oral)   Resp 18   SpO2 98%   Physical Exam Constitutional:      General: He is not in acute distress.    Appearance: Normal appearance. He is well-developed. He is not toxic-appearing or diaphoretic.  HENT:     Head: Normocephalic and atraumatic.  Eyes:     Conjunctiva/sclera: Conjunctivae normal.     Pupils: Pupils are equal, round, and reactive to light.  Cardiovascular:  Rate and Rhythm: Normal rate and regular rhythm.  Pulmonary:     Effort: Pulmonary effort is normal. No respiratory distress.     Comments: LCTA Musculoskeletal:     Cervical back: Normal range of motion and neck supple.     Comments: 2 healing scab  wound to the right anterior shin, approx 1.5cm in size each. 1 healing scab wound to the left anterior shin, approx 2cm in size. No surrounding erythema, warmth. Wound clean and dry. 2-3+ pitting edema bilaterally. No tenderness to the calf  Skin:    General: Skin is warm and dry.  Neurological:     Mental Status: He is alert and oriented to person, place, and time.      UC Treatments / Results  Labs (all labs ordered are listed, but only abnormal results are displayed) Labs Reviewed - No data to display  EKG   Radiology No results found.  Procedures Procedures (including critical care time)  Medications Ordered in UC Medications - No data to display  Initial Impression / Assessment and Plan / UC Course  I have reviewed the triage vital signs and the nursing notes.  Pertinent labs & imaging results that were available during my care of the patient were reviewed by me and considered in my medical decision making (see chart for details).    Healing wound without signs of cellulitis, abscess.  Patient does have 2-3+ pitting edema bilaterally.  Patient denies chest pain, shortness of breath, orthopnea. RRR, LCTAB.  Will provide short course of Lasix for swelling.  Otherwise given history of DM, poor wound healing, if symptoms not improving, to follow-up with wound care for monitoring.  Return precautions given.  Patient expresses understanding agrees plan.  Final Clinical Impressions(s) / UC Diagnoses   Final diagnoses:  Visit for wound check  Leg swelling   ED Prescriptions    Medication Sig Dispense Auth. Provider   furosemide (LASIX) 20 MG tablet Take 1 tablet (20 mg total) by mouth daily. 6 tablet Ok Edwards, PA-C     PDMP not reviewed this encounter.   Ok Edwards, PA-C 10/13/20 1032

## 2020-10-15 DIAGNOSIS — Z961 Presence of intraocular lens: Secondary | ICD-10-CM | POA: Diagnosis not present

## 2020-10-20 ENCOUNTER — Ambulatory Visit (INDEPENDENT_AMBULATORY_CARE_PROVIDER_SITE_OTHER): Payer: PPO | Admitting: Physician Assistant

## 2020-10-20 ENCOUNTER — Other Ambulatory Visit: Payer: Self-pay

## 2020-10-20 ENCOUNTER — Encounter: Payer: Self-pay | Admitting: Physician Assistant

## 2020-10-20 VITALS — BP 132/74 | HR 76 | Temp 97.5°F | Ht 71.0 in | Wt 229.9 lb

## 2020-10-20 DIAGNOSIS — I480 Paroxysmal atrial fibrillation: Secondary | ICD-10-CM | POA: Diagnosis not present

## 2020-10-20 DIAGNOSIS — E1142 Type 2 diabetes mellitus with diabetic polyneuropathy: Secondary | ICD-10-CM

## 2020-10-20 DIAGNOSIS — E669 Obesity, unspecified: Secondary | ICD-10-CM | POA: Diagnosis not present

## 2020-10-20 DIAGNOSIS — E1169 Type 2 diabetes mellitus with other specified complication: Secondary | ICD-10-CM | POA: Diagnosis not present

## 2020-10-20 DIAGNOSIS — Z23 Encounter for immunization: Secondary | ICD-10-CM | POA: Diagnosis not present

## 2020-10-20 DIAGNOSIS — L0390 Cellulitis, unspecified: Secondary | ICD-10-CM | POA: Diagnosis not present

## 2020-10-20 DIAGNOSIS — E1159 Type 2 diabetes mellitus with other circulatory complications: Secondary | ICD-10-CM | POA: Diagnosis not present

## 2020-10-20 DIAGNOSIS — I152 Hypertension secondary to endocrine disorders: Secondary | ICD-10-CM | POA: Diagnosis not present

## 2020-10-20 DIAGNOSIS — Z720 Tobacco use: Secondary | ICD-10-CM

## 2020-10-20 DIAGNOSIS — E782 Mixed hyperlipidemia: Secondary | ICD-10-CM

## 2020-10-20 LAB — POCT GLYCOSYLATED HEMOGLOBIN (HGB A1C): Hemoglobin A1C: 6.6 % — AB (ref 4.0–5.6)

## 2020-10-20 MED ORDER — METFORMIN HCL ER 500 MG PO TB24: ORAL_TABLET | ORAL | 0 refills | Status: DC

## 2020-10-20 MED ORDER — GABAPENTIN 300 MG PO CAPS: ORAL_CAPSULE | ORAL | 0 refills | Status: DC

## 2020-10-20 MED ORDER — BLOOD GLUCOSE MONITOR KIT: PACK | 0 refills | Status: AC

## 2020-10-20 MED ORDER — WELLBUTRIN SR 150 MG PO TB12: 150.0000 mg | ORAL_TABLET | Freq: Two times a day (BID) | ORAL | 3 refills | Status: DC

## 2020-10-20 MED ORDER — DOXYCYCLINE HYCLATE 100 MG PO TABS: 100.0000 mg | ORAL_TABLET | Freq: Two times a day (BID) | ORAL | 0 refills | Status: DC

## 2020-10-20 NOTE — Assessment & Plan Note (Addendum)
-  Stable -Continue with current medication regimen. -Encourage to perform daily foot examinations to evaluate for ulcers or skin lesions. -Will continue to monitor.

## 2020-10-20 NOTE — Assessment & Plan Note (Signed)
-  Last lipid panel WNL, LDL 25 at goal -Continue current medication regimen. -Follow a heart healthy diet low in saturated and trans fats. -Stay as active as possible. -Will continue to monitor.

## 2020-10-20 NOTE — Assessment & Plan Note (Signed)
-  Provided a refill of Wellbutrin. -Encourage patient to continue to reduce tobacco use.

## 2020-10-20 NOTE — Assessment & Plan Note (Signed)
Followed by Cardiology 

## 2020-10-20 NOTE — Assessment & Plan Note (Signed)
-  BP stable -Continue current medication regimen. -Continue low-sodium diet. -Stay well-hydrated. -Will continue to monitor.

## 2020-10-20 NOTE — Assessment & Plan Note (Addendum)
-  A1c today improved from 7.6 to 6.6, at goal -Discussed with patient to continue with Metformin. -Will send new prescription for glucometer kit. -Continue with reducing glucose and monitor carbohydrates. -Will continue to monitor.

## 2020-10-20 NOTE — [facility] Notes (Signed)
Need for  Established Patient Office Visit  Subjective:  Patient ID: [name], male    DOB: 09-04-1951  Age: 69 y.o. MRN: [id number]  CC:  Chief Complaint  Patient presents with  . Diabetes  . Hypertension  . Hyperlipidemia    HPI [name] presents for follow up on diabetes mellitus, hypertension, and hyperlipidemia.  Diabetes: Pt denies increased urination or thirst. Pt is not taking Ozempic because it was too expensive.  Reports compliance with Metformin. No hypoglycemic events.  Has not been checking glucose at home because he does not have a meter. Reports he takes care of his mother who has dementia and is concerned she maybe misplaced it.  Has significantly decreased sweets.  HTN: Pt denies chest pain, palpitations, shortness of breath, or dizziness.  Taking medication as directed without side effects.  Pt follows a low salt diet.  HLD: Pt taking medication as directed without issues. Denies side effects.  Reports has decreased dairy and fried foods.  Mood/Tobacco use: Requesting refill of Wellbutrin which she is taking to help with smoking and also mood. Sometimes gets stressed with taking care of his mother.  Wound: Has been to urgent care for wound on lower extremities and completed antibiotic given. Reports he tried keeping area covered and wore compression socks as instructed but wound started draining and soaked gauze as well as sock so stopped wearing them. Continues to have drainage. Was given Lasix which has helped with the swelling. Denies fever, warmth or erythema. States has an appointment with wound care next month.   Past Medical History:  Diagnosis Date  . Abdominal aortic aneurysm (AAA) ([place])    3 cm per 07-02-18 US abdominal aorta US epic  . Arthritis   . Blood clot in vein    behind left knee  . Chronic kidney disease    renal calculi  . Chronic venous insufficiency LOWER EXTREMITIES  . Coronary artery disease CARDIOLOGIST - DR  QMVHQION-  LAST  1 WK AGO -- WILL REQUEST NOTE AND STRESS TEST  . Diabetes mellitus without complication ([place])    type 2  . Hematuria    last year  . Hyperglycemia   . Hyperlipidemia   . Hypertension   . Mixed dyslipidemia   . Myocardial infarction ([place])   . Neuropathy    toes only  . Nocturia   . Prostate cancer ([place]) 08/18/11   gleason 8, volume 24.4cc  . S/P CABG x 4   . ST elevation MI (STEMI) ([place]) 02-10-2008   S/P CABG  . Stroke [facility]) 2016   occ trouble wriing with right hand  . Urinary hesitancy   . Vision abnormalities    resolved now  . Wound discharge    since jan 2019 goes to wound center [place] left great toe small open area changes dressing q 2 days with ointment provided by wound center, clear drainage occ    Past Surgical History:  Procedure Laterality Date  . CARDIAC CATHETERIZATION  05/01/2012   grafts widely patent  . CARDIOVASCULAR STRESS TEST  03-15-2010   INFERIOR WALL SCAR WITHOUT ANY MEANINGFUL ISCHEMIA/ EF 46% / LOW RISK SCAN  . CATARACT EXTRACTION    . CORONARY ARTERY BYPASS GRAFT  02-10-2008  DR Einar Gip   X4 VESSEL DISEASE / [facility] CABG  . CYSTOSCOPY  02/08/2012   Procedure: CYSTOSCOPY FLEXIBLE;  Surgeon: Franchot Gallo, MD;  Location: [facility];  Service: Urology;;  . CYSTOSCOPY WITH RETROGRADE PYELOGRAM, URETEROSCOPY AND  STENT PLACEMENT Right 09/12/2018   Procedure: CYSTOSCOPY WITH RETROGRADE PYELOGRAM, URETEROSCOPY AND STENT PLACEMENT, STONE EXTRACTION;  Surgeon: Franchot Gallo, MD;  Location: [facility];  Service: Urology;  Laterality: Right;  . EP IMPLANTABLE DEVICE N/A 08/14/2016   Procedure: Loop Recorder Insertion;  Surgeon: Evans Lance, MD;  Location: [facility];  Service: Cardiovascular;  Laterality: N/A;  . EXTRACORPOREAL SHOCK WAVE LITHOTRIPSY  2013  . HOLMIUM LASER APPLICATION Right 01/08/7261   Procedure: HOLMIUM LASER APPLICATION;  Surgeon: Franchot Gallo, MD;  Location: [facility];  Service: Urology;  Laterality: Right;   . KNEE SURGERY  age 58   RIGHT  . LEFT HEART CATHETERIZATION WITH CORONARY/GRAFT ANGIOGRAM N/A 05/01/2012   Procedure: LEFT HEART CATHETERIZATION WITH Beatrix Fetters;  Surgeon: Sanda Klein, MD;  Location: [facility];  Service: Cardiovascular;  Laterality: N/A;  . MULTIPLE TEETH EXTRACTIONS (23)/ FOUR QUADRANT ALVEOLPLASTY/ MANDIBULAR LATERAL EXOSTOSES REDUCTIONS  03-24-2010   CHRONIC PERIODONTITIS  . RADIOACTIVE SEED IMPLANT  02/08/2012   Procedure: RADIOACTIVE SEED IMPLANT;  Surgeon: Franchot Gallo, MD;  Location: [facility];  Service: Urology;  Laterality: N/A;  C-ARM   . SHOULDER SURGERY  1982   LEFT  . TEE WITHOUT CARDIOVERSION N/A 08/14/2016   Procedure: TRANSESOPHAGEAL ECHOCARDIOGRAM (TEE);  Surgeon: Josue Hector, MD;  Location: [facility] ENDOSCOPY;  Service: Cardiovascular;  Laterality: N/A;  . URETERAL STENT PLACEMENT      Family History  Problem Relation Age of Onset  . Cancer Father        pancreatic  . Diabetes Father     Social History   Socioeconomic History  . Marital status: Married    Spouse name: Not on file  . Number of children: Not on file  . Years of education: Not on file  . Highest education level: Not on file  Occupational History  . Not on file  Tobacco Use  . Smoking status: Current Every Day Smoker    Packs/day: 1.00    Years: 50.00    Pack years: 50.00    Types: Cigarettes  . Smokeless tobacco: Never Used  . Tobacco comment: PREVIOUSLY SMOKED 3PPD / DECREASED TO 1PPD SINCE 2009  Vaping Use  . Vaping Use: Never used  Substance and Sexual Activity  . Alcohol use: Not Currently    Alcohol/week: 2.0 standard drinks    Types: 1 Cans of beer, 1 Shots of liquor per week    Comment: none in 3 years  . Drug use: No  . Sexual activity: Never  Other Topics Concern  . Not on file  Social History Narrative  . Not on file   Social Determinants of Health   Financial Resource Strain:   . Difficulty of Paying Living  Expenses: Not on file  Food Insecurity:   . Worried About Charity fundraiser in the Last Year: Not on file  . Ran Out of Food in the Last Year: Not on file  Transportation Needs:   . Lack of Transportation (Medical): Not on file  . Lack of Transportation (Non-Medical): Not on file  Physical Activity:   . Days of Exercise per Week: Not on file  . Minutes of Exercise per Session: Not on file  Stress:   . Feeling of Stress : Not on file  Social Connections:   . Frequency of Communication with Friends and Family: Not on file  . Frequency of Social Gatherings with Friends and Family: Not on file  . Attends Religious Services: Not  on file  . Active Member of Clubs or Organizations: Not on file  . Attends Archivist Meetings: Not on file  . Marital Status: Not on file  Intimate Partner Violence:   . Fear of Current or Ex-Partner: Not on file  . Emotionally Abused: Not on file  . Physically Abused: Not on file  . Sexually Abused: Not on file    Outpatient Medications Prior to Visit  Medication Sig Dispense Refill  . atorvastatin (LIPITOR) 10 MG tablet Take 1 tablet (10 mg total) by mouth daily. 90 tablet 0  . Continuous Blood Gluc Receiver (FREESTYLE LIBRE 14 DAY READER) DEVI 1 Device by Does not apply route daily. Use to check blood sugars every morning and 2 hours after largest meals 1 Device 0  . Continuous Blood Gluc Sensor (FREESTYLE LIBRE 14 DAY SENSOR) MISC USE TO CHECK BLOOD SUGARS IN THE MORNING FASTING AND  2  HOURS  AFTER  LARGEST  MEAL 6 each 0  . ELIQUIS 5 MG TABS tablet TAKE 1 TABLET BY MOUTH TWICE DAILY(PLEASE SCHEDULE MD APPT FOR FURTHER REFILLS) 180 tablet 0  . finasteride (PROSCAR) 5 MG tablet Take 5 mg by mouth daily.  11  . furosemide (LASIX) 20 MG tablet Take 1 tablet (20 mg total) by mouth daily. (Patient taking differently: Take 10 mg by mouth daily. ) 6 tablet 0  . glucose blood (FREESTYLE LITE) test strip Use to check blood sugars every morning fasting  100 each 12  . lisinopril (ZESTRIL) 5 MG tablet Take 1/2 (one-half) tablet by mouth once daily 45 tablet 2  . Multiple Vitamin (MULTIVITAMIN WITH MINERALS) TABS tablet Take 1 tablet by mouth daily. Men's One-A-Day Multivitamin    . tamsulosin (FLOMAX) 0.4 MG CAPS capsule Take 1 capsule by mouth once daily at bedtime 90 capsule 0  . gabapentin (NEURONTIN) 300 MG capsule TAKE 2 CAPSULE BY MOUTH IN THE MORNING AND 2 IN THE EVENING AT  DINNER  TIME 360 capsule 0  . metFORMIN (GLUCOPHAGE-XR) 500 MG 24 hr tablet TAKE 4 TABLETS BY MOUTH ONCE DAILY WITH BREAKFAST 720 tablet 0  . WELLBUTRIN SR 150 MG 12 hr tablet Take 1 tablet (150 mg total) by mouth 2 (two) times daily. 60 tablet 11  . oxybutynin (DITROPAN) 5 MG tablet Take 1 tablet (5 mg total) by mouth every 8 (eight) hours as needed for bladder spasms. 30 tablet 1  . Semaglutide,0.25 or 0.5MG/DOS, (OZEMPIC, 0.25 OR 0.5 MG/DOSE,) 2 MG/1.5ML SOPN Inject 0.25 mg into the skin once weekly x 14 days. Then inject 0.5 mg once weekly. 5 pen 4   No facility-administered medications prior to visit.    Allergies  Allergen Reactions  . Bee Venom Anaphylaxis  . Oysters [Shellfish Allergy] Swelling    Swelling of hands  . Penicillins Other (See Comments)    CAUSES "FREE BLEEDING" Has patient had a PCN reaction causing immediate rash, facial/tongue/throat swelling, SOB or lightheadedness with hypotension: No Has patient had a PCN reaction causing severe rash involving mucus membranes or skin necrosis: No Has patient had a PCN reaction that required hospitalization No Has patient had a PCN reaction occurring within the last 10 years: No If all of the above answers are "NO", then may proceed with Cephalosporin use.    ROS Review of Systems A fourteen system review of systems was performed and found to be positive as per HPI.   Objective:    Physical Exam General:  Well Developed, well nourished, appropriate for  stated age.  Neuro:  Alert and oriented,   extra-ocular muscles intact  HEENT:  Normocephalic, atraumatic, neck supple Skin: Skin abrasions of right lower leg are healing with scab formation without erythema or drainage. 2 cm x 1.8 cm skin abrasion on left anterior shin with serosanguineous drainage with mild redness, tenderness without warmth.  Cardiac:  RRR, S1 S2 Respiratory:  ECTA B/L and A/P, Not using accessory muscles, speaking in full sentences- unlabored. Vascular:  Pitting edema of lower extremity present, R>L Psych:  No HI/SI, judgement and insight good, Euthymic mood. Full Affect.   BP 132/74   Pulse 76   Temp (!) 97.5 F (36.4 C) (Oral)   Ht _0  (1.803 m)   Wt 229 lb 14.4 oz (104.3 kg)   SpO2 100% Comment: on RA  BMI 32.06 kg/m  Wt Readings from Last 3 Encounters:  10/20/20 229 lb 14.4 oz (104.3 kg)  10/04/20 227 lb (103 kg)  07/20/20 (!) 227 lb 4.8 oz (103.1 kg)     Health Maintenance Due  Topic Date Due  . PNA vac Low Risk Adult (2 of 2 - PPSV23) 11/27/2018  . OPHTHALMOLOGY EXAM  05/04/2020    There are no preventive care reminders to display for this patient.  Lab Results  Component Value Date   TSH 1.710 09/02/2019   Lab Results  Component Value Date   WBC 12.8 (H) 09/02/2019   HGB 13.6 09/02/2019   HCT 40.7 09/02/2019   MCV 84 09/02/2019   PLT 222 09/02/2019   Lab Results  Component Value Date   NA 140 09/02/2019   K 4.0 09/02/2019   CO2 21 09/02/2019   GLUCOSE 133 (H) 09/02/2019   BUN 10 09/02/2019   CREATININE 0.72 (L) 09/02/2019   BILITOT 0.3 09/02/2019   ALKPHOS 130 (H) 09/02/2019   AST 20 09/02/2019   ALT 22 09/02/2019   PROT 6.9 09/02/2019   ALBUMIN 4.4 09/02/2019   CALCIUM 9.4 09/02/2019   ANIONGAP 11 09/04/2018   Lab Results  Component Value Date   CHOL 75 (L) 09/02/2019   Lab Results  Component Value Date   HDL 30 (L) 09/02/2019   Lab Results  Component Value Date   LDLCALC 25 09/02/2019   Lab Results  Component Value Date   TRIG 106 09/02/2019   Lab  Results  Component Value Date   CHOLHDL 2.5 09/02/2019   Lab Results  Component Value Date   HGBA1C 6.6 (A) 10/20/2020      Assessment & Plan:   Problem List Items Addressed This Visit      Cardiovascular and Mediastinum   Hypertension associated with diabetes ([place])    -BP stable -Continue current medication regimen. -Continue low-sodium diet. -Stay well-hydrated. -Will continue to monitor.      Relevant Medications   metFORMIN (GLUCOPHAGE-XR) 500 MG 24 hr tablet   Paroxysmal atrial fibrillation ([place])    -Followed by Cardiology.        Endocrine   Diabetes mellitus type 2 in obese (HCC) - Primary    -A1c today improved from 7.6 to 6.6, at goal -Discussed with patient to continue with Metformin. -Will send new prescription for glucometer kit. -Continue with reducing glucose and monitor carbohydrates. -Will continue to monitor.      Relevant Medications   metFORMIN (GLUCOPHAGE-XR) 500 MG 24 hr tablet   blood glucose meter kit and supplies KIT   Other Relevant Orders   POCT glycosylated hemoglobin (Hb A1C) (Completed)   Mixed diabetic  hyperlipidemia associated with type 2 diabetes mellitus (HCC)    -Last lipid panel WNL, LDL 25 at goal -Continue current medication regimen. -Follow a heart healthy diet low in saturated and trans fats. -Stay as active as possible. -Will continue to monitor.      Relevant Medications   metFORMIN (GLUCOPHAGE-XR) 500 MG 24 hr tablet   Diabetic polyneuropathy associated with type 2 diabetes mellitus ([place])    -Stable -Continue with current medication regimen. -Encourage to perform daily foot examinations to evaluate for ulcers or skin lesions. -Will continue to monitor.      Relevant Medications   WELLBUTRIN SR 150 MG 12 hr tablet   gabapentin (NEURONTIN) 300 MG capsule   metFORMIN (GLUCOPHAGE-XR) 500 MG 24 hr tablet     Other   Tobacco abuse    -Provided a refill of Wellbutrin. -Encourage patient to continue to reduce  tobacco use.      Relevant Medications   WELLBUTRIN SR 150 MG 12 hr tablet    Other Visit Diagnoses    Wound cellulitis       Relevant Medications   doxycycline (VIBRA-TABS) 100 MG tablet   Need for influenza vaccination       Relevant Orders   Flu Vaccine QUAD High Dose(Fluad) (Completed)     Wound cellulitis: -Patient has multiple risk factors for poor wound healing including PAD and diabetes, completed cephalexin antibiotic and abrasion of left lower leg is not healing so will start doxycycline.  Advised to follow-up with wound care as scheduled. -Keep area clean and dry. -Use Lasix as needed for swelling.  Meds ordered this encounter  Medications  . WELLBUTRIN SR 150 MG 12 hr tablet    Sig: Take 1 tablet (150 mg total) by mouth 2 (two) times daily.    Dispense:  60 tablet    Refill:  3  . gabapentin (NEURONTIN) 300 MG capsule    Sig: TAKE 2 CAPSULE BY MOUTH IN THE MORNING AND 2 IN THE EVENING AT  DINNER  TIME    Dispense:  360 capsule    Refill:  0  . metFORMIN (GLUCOPHAGE-XR) 500 MG 24 hr tablet    Sig: TAKE 4 TABLETS BY MOUTH ONCE DAILY WITH BREAKFAST    Dispense:  720 tablet    Refill:  0  . doxycycline (VIBRA-TABS) 100 MG tablet    Sig: Take 1 tablet (100 mg total) by mouth 2 (two) times daily.    Dispense:  20 tablet    Refill:  0    Order Specific Question:   Supervising Provider    Answer:   Beatrice Lecher D [[id number]]  . blood glucose meter kit and supplies KIT    Sig: Dispense based on patient and insurance preference.    Dispense:  1 each    Refill:  0    Order Specific Question:   Supervising Provider    Answer:   Beatrice Lecher D [[id number]]    Order Specific Question:   Number of strips    Answer:   100    Order Specific Question:   Number of lancets    Answer:   100    Follow-up: Return in about 3 months (around 01/20/2021) for DM, HTN, HLD and FBW.   Note:  This note was prepared with assistance of Dragon voice recognition software. Occasional  wrong-word or sound-a-like substitutions may have occurred due to the inherent limitations of voice recognition software.  [name], PA-C

## 2020-11-10 ENCOUNTER — Other Ambulatory Visit: Payer: Self-pay

## 2020-11-10 ENCOUNTER — Encounter (HOSPITAL_BASED_OUTPATIENT_CLINIC_OR_DEPARTMENT_OTHER): Payer: PPO | Attending: Physician Assistant | Admitting: Physician Assistant

## 2020-11-10 DIAGNOSIS — L97812 Non-pressure chronic ulcer of other part of right lower leg with fat layer exposed: Secondary | ICD-10-CM | POA: Insufficient documentation

## 2020-11-10 DIAGNOSIS — L97822 Non-pressure chronic ulcer of other part of left lower leg with fat layer exposed: Secondary | ICD-10-CM | POA: Diagnosis not present

## 2020-11-10 DIAGNOSIS — I872 Venous insufficiency (chronic) (peripheral): Secondary | ICD-10-CM | POA: Insufficient documentation

## 2020-11-10 DIAGNOSIS — E11622 Type 2 diabetes mellitus with other skin ulcer: Secondary | ICD-10-CM | POA: Diagnosis not present

## 2020-11-10 DIAGNOSIS — I252 Old myocardial infarction: Secondary | ICD-10-CM | POA: Insufficient documentation

## 2020-11-10 DIAGNOSIS — E1151 Type 2 diabetes mellitus with diabetic peripheral angiopathy without gangrene: Secondary | ICD-10-CM | POA: Diagnosis not present

## 2020-11-10 DIAGNOSIS — I890 Lymphedema, not elsewhere classified: Secondary | ICD-10-CM | POA: Insufficient documentation

## 2020-11-10 DIAGNOSIS — I2510 Atherosclerotic heart disease of native coronary artery without angina pectoris: Secondary | ICD-10-CM | POA: Diagnosis not present

## 2020-11-10 NOTE — [facility] Notes (Signed)
Marcus, [name] ([id number]) . Visit Report for 11/10/2020 Abuse/Suicide Risk Screen Details Patient Name: Date of Service: [name]. 11/10/2020 9:00 A M Medical Record Number: [id number] Patient Account Number: [id number] Date of Birth/Sex: Treating RN: 06-Feb-1951 (69 y.o. [name] Primary Care [name]: Marcus Mallick, MA [name] Other Clinician: Referring Ryenne Lynam: Treating Deano Tomaszewski/Extender: Hal Morales, MA [name] Weeks in Treatment: 0 Abuse/Suicide Risk Screen Items Answer ABUSE RISK SCREEN: Has anyone close to you tried to hurt or harm you recentlyo No Do you feel uncomfortable with anyone in your familyo No Has anyone forced you do things that you didnt want to doo No Electronic Signature(s) Signed: 11/10/2020 5:17:00 PM By: Marcus Hurst RN, BSN Entered By: [name] on 11/10/2020 09:42:50 -------------------------------------------------------------------------------- Activities of Daily Living Details Patient Name: Date of Service: [name] 11/10/2020 9:00 A M Medical Record Number: [id number] Patient Account Number: [id number] Date of Birth/Sex: Treating RN: [birth year] (69 y.o. [name] Primary Care Daysie Helf: Marcus Mallick, MA [name] Other Clinician: Referring [name]: Treating [name]/Extender: Hal Morales, MA [name] Weeks in Treatment: 0 Activities of Daily Living Items Answer Activities of Daily Living (Please select one for each item) Drive Automobile Completely Able T Medications ake Completely Able Use T elephone Completely Able Care for Appearance Completely Able Use T oilet Completely Able Bath / Shower Completely Able Dress Self Completely Able Feed Self Completely Able Walk Completely Able Get In / Out Bed Completely Able Housework Completely Able Prepare Meals Completely [place] for Self Completely Able Electronic Signature(s) Signed: 11/10/2020 5:17:00 PM  By: Marcus Hurst RN, BSN Entered By: [name] on 11/10/2020 09:43:11 -------------------------------------------------------------------------------- Education Screening Details Patient Name: Date of Service: [name], Marcus [name] 11/10/2020 9:00 A M Medical Record Number: [id number] Patient Account Number: [id number] Date of Birth/Sex: Treating RN: 1951-09-11 (69 y.o. [name] Primary Care Eliyanah Elgersma: Marcus Mallick, MA [name] Other Clinician: Referring [name]: Treating [name]/Extender: Hal Morales, MA [name] Weeks in Treatment: 0 Primary Learner Assessed: Patient Learning Preferences/Education Level/Primary Language Learning Preference: Explanation, Demonstration, Printed Material Highest Education Level: High School Preferred Language: English Cognitive Barrier Language Barrier: No Translator Needed: No Memory Deficit: No Emotional Barrier: No Cultural/Religious Beliefs Affecting Medical Care: No Physical Barrier Impaired Vision: No Impaired Hearing: No Decreased Hand dexterity: No Knowledge/Comprehension Knowledge Level: High Comprehension Level: High Ability to understand written instructions: High Ability to understand verbal instructions: High Motivation Anxiety Level: Calm Cooperation: Cooperative Education Importance: Acknowledges Need Interest in Health Problems: Asks Questions Perception: Coherent Willingness to Engage in Self-Management High Activities: Readiness to Engage in Self-Management High Activities: Electronic Signature(s) Signed: 11/10/2020 5:17:00 PM By: Marcus Hurst RN, BSN Entered By: [name] on 11/10/2020 09:43:39 -------------------------------------------------------------------------------- Fall Risk Assessment Details Patient Name: Date of Service: [name], Marcus [name] 11/10/2020 9:00 A M Medical Record Number: [id number] Patient Account Number: [id number] Date of Birth/Sex: Treating RN: Feb 24, 1951 (69  y.o. [name] Primary Care [name]: Marcus Mallick, MA [name] Other Clinician: Referring [name]: Treating [name]/Extender: Hal Morales, MA [name] Weeks in Treatment: 0 Fall Risk Assessment Items Have you had 2 or more falls in the last 12 monthso 0 No Have you had any fall that resulted in injury in the last 12 monthso 0 No FALLS RISK SCREEN History of falling - immediate or within 3 months 0 No  Secondary diagnosis (Do you have 2 or more medical diagnoseso) 15 Yes Ambulatory aid None/bed rest/wheelchair/nurse 0 Yes Crutches/cane/walker 0 No Furniture 0 No Intravenous therapy Access/Saline/Heparin Lock 0 No Gait/Transferring Normal/ bed rest/ wheelchair 0 Yes Weak (short steps with or without shuffle, stooped but able to lift head while walking, may seek 0 No support from furniture) Impaired (short steps with shuffle, may have difficulty arising from chair, head down, impaired 0 No balance) Mental Status Oriented to own ability 0 Yes Electronic Signature(s) Signed: 11/10/2020 5:17:00 PM By: Marcus Hurst RN, BSN Entered By: [name] on 11/10/2020 09:44:12 -------------------------------------------------------------------------------- Foot Assessment Details Patient Name: Date of Service: [name], Marcus [name] 11/10/2020 9:00 A M Medical Record Number: [id number] Patient Account Number: [id number] Date of Birth/Sex: Treating RN: May 29, 1951 (69 y.o. [name] Primary Care [name]: Marcus Mallick, MA [name] Other Clinician: Referring [name]: Treating [name]/Extender: Hal Morales, MA [name] Weeks in Treatment: 0 Foot Assessment Items Site Locations + = Sensation present, - = Sensation absent, C = Callus, U = Ulcer R = Redness, W = Warmth, M = Maceration, PU = Pre-ulcerative lesion F = Fissure, S = Swelling, D = Dryness Assessment Right: Left: Other Deformity: No No Prior Foot Ulcer: No No Prior Amputation: No No Charcot Joint:  No No Ambulatory Status: Ambulatory Without Help Gait: Steady Electronic Signature(s) Signed: 11/10/2020 5:17:00 PM By: Marcus Hurst RN, BSN Entered By: [name] on 11/10/2020 09:46:40 -------------------------------------------------------------------------------- Nutrition Risk Screening Details Patient Name: Date of Service: [name], Marcus [name] 11/10/2020 9:00 A M Medical Record Number: [id number] Patient Account Number: [id number] Date of Birth/Sex: Treating RN: [birth year] (69 y.o. [name] Primary Care Shlonda Dolloff: Marcus Mallick, MA [name] Other Clinician: Referring Lucina Betty: Treating Eri Platten/Extender: Hal Morales, MA [name] Weeks in Treatment: 0 Height (in): 71 Weight (lbs): 225 Body Mass Index (BMI): 31.4 Nutrition Risk Screening Items Score Screening NUTRITION RISK SCREEN: I have an illness or condition that made me change the kind and/or amount of food I eat 2 Yes I eat fewer than two meals per day 0 No I eat few fruits and vegetables, or milk products 0 No I have three or more drinks of beer, liquor or wine almost every day 0 No I have tooth or mouth problems that make it hard for me to eat 0 No I don't always have enough money to buy the food I need 0 No I eat alone most of the time 0 No I take three or more different prescribed or over-the-counter drugs a day 1 Yes Without wanting to, I have lost or gained 10 pounds in the last six months 0 No I am not always physically able to shop, cook and/or feed myself 0 No Nutrition Protocols Good Risk Protocol Moderate Risk Protocol 0 Provide education on nutrition High Risk Proctocol Risk Level: Moderate Risk Score: 3 Electronic Signature(s) Signed: 11/10/2020 5:17:00 PM By: Marcus Hurst RN, BSN Entered By: [name] on 11/10/2020 09:44:31

## 2020-11-11 NOTE — Progress Notes (Signed)
LEW, PROUT ([id number]) . Visit Report for 11/10/2020 Chief Complaint Document Details Patient Name: Date of Service: [name]. 11/10/2020 9:00 A M Medical Record Number: [id number] Patient Account Number: [id number] Date of Birth/Sex: Treating RN: Aug 22, 1951 (69 y.o. Male) Baruch Gouty Primary Care Provider: [name], MA [name] Other Clinician: Referring Provider: Treating Provider/Extender: Hal Morales, MA [name] Weeks in Treatment: 0 Information Obtained from: Patient Chief Complaint Bilateral LE Ulcers Electronic Signature(s) Signed: 11/10/2020 10:10:06 AM By: Worthy Keeler PA-C Entered By: Worthy Keeler on 11/10/2020 10:10:06 -------------------------------------------------------------------------------- HPI Details Patient Name: Date of Service: [name], Marcus [name] 11/10/2020 9:00 A M Medical Record Number: [id number] Patient Account Number: [id number] Date of Birth/Sex: Treating RN: Sep 26, 1951 (69 y.o. Male) Baruch Gouty Primary Care Provider: [name], MA [name] Other Clinician: Referring Provider: Treating Provider/Extender: Hal Morales, MA [name] Weeks in Treatment: 0 History of Present Illness HPI Description: 11/10/2020 patient presents today for initial evaluation in this clinic although I have seen this patient in [place] previous. Subsequently his issue today is different from when I saw him for I was treating him for a toe ulcer. Currently he is having issues with bilateral lower extremity ulcers after running into a metal bar. Upon inspection today he has wounds of the bilateral lower extremities which are consistent with having struck his legs on the anterior aspect. With that being said the patient did go to the ER for evaluation on October 11 where he was given Keflex initially. He went back to the ER after not improving on October 20 he was given Lasix at that point he states the Lasix seem to help the most.  Has been using Neosporin and leaving this open area which is probably not the best way to go either to be honest. He has had arterial studies on March 2021 which showed a left ABI of 0.78 with a TBI of 0.34 and a right ABI of 1.18 with a TBI 1.18. Nonetheless his arterial flow the left is not ideal but also I think potentially consistent with allowing this to heal. I think he can also support light compression with Kerlix and Coban based on this result. His most recent hemoglobin A1c that we could find was 7.3 and that was towards the end of 2020. The patient does have a history of heart disease unfortunately. He is also currently still a smoker. Electronic Signature(s) Signed: 11/10/2020 10:27:10 AM By: Worthy Keeler PA-C Entered By: Worthy Keeler on 11/10/2020 10:27:09 -------------------------------------------------------------------------------- Physical Exam Details Patient Name: Date of Service: [name], Marcus M T. 11/10/2020 9:00 A M Medical Record Number: [id number] Patient Account Number: [id number] Date of Birth/Sex: Treating RN: 06-04-1951 (69 y.o. Male) Baruch Gouty Primary Care Provider: [name], MA [name] Other Clinician: Referring Provider: Treating Provider/Extender: Hal Morales, MA [name] Weeks in Treatment: 0 Constitutional patient is hypertensive.. pulse regular and within target range for patient.Marland Kitchen respirations regular, non-labored and within target range for patient.Marland Kitchen temperature within target range for patient.. Well-nourished and well-hydrated in no acute distress. Eyes conjunctiva clear no eyelid edema noted. pupils equal round and reactive to light and accommodation. Ears, Nose, Mouth, and Throat no gross abnormality of ear auricles or external auditory canals. normal hearing noted during conversation. mucus membranes moist. Respiratory normal breathing without difficulty. Cardiovascular 1+ dorsalis pedis/posterior tibialis pulses. 2+ pitting  edema of the bilateral lower extremities. Musculoskeletal  normal gait and posture. no significant deformity or arthritic changes, no loss or range of motion, no clubbing. Psychiatric this patient is able to make decisions and demonstrates good insight into disease process. Alert and Oriented x 3. pleasant and cooperative. Notes Upon inspection patient's wound bed actually showed signs of good granulation at this time once I was able to clear with saline and gauze debridement off the slough on the surface of the wound currently. He tolerated all that today without complication post debridement the wound bed appears to be doing much better which is great news. No sharp debridement was necessary today. Electronic Signature(s) Signed: 11/10/2020 10:28:09 AM By: Worthy Keeler PA-C Entered By: Worthy Keeler on 11/10/2020 10:28:06 -------------------------------------------------------------------------------- Physician Orders Details Patient Name: Date of Service: [name], Marcus [name] 11/10/2020 9:00 A M Medical Record Number: [id number] Patient Account Number: [id number] Date of Birth/Sex: Treating RN: 09/03/1951 (69 y.o. Male) Baruch Gouty Primary Care Provider: [name], MA [name] Other Clinician: Referring Provider: Treating Provider/Extender: Hal Morales, MA [name] Weeks in Treatment: 0 Verbal / Phone Orders: No Diagnosis Coding ICD-10 Coding Code Description E11.622 Type 2 diabetes mellitus with other skin ulcer I89.0 Lymphedema, not elsewhere classified I87.2 Venous insufficiency (chronic) (peripheral) L97.812 Non-pressure chronic ulcer of other part of right lower leg with fat layer exposed L97.822 Non-pressure chronic ulcer of other part of left lower leg with fat layer exposed I25.10 Atherosclerotic heart disease of native coronary artery without angina pectoris Follow-up Appointments ppointment in 2 weeks. - Wed 12/3 with Margarita Grizzle Return A Nurse Visit: - Tues  11/23 Dressing Change Frequency Wound #1 Right,Anterior Lower Leg Do not change entire dressing for one week. Wound #2 Left,Anterior Lower Leg Do not change entire dressing for one week. Skin Barriers/Peri-Wound Care Moisturizing lotion - both legs Wound Cleansing May shower with protection. Primary Wound Dressing Wound #1 Right,Anterior Lower Leg Calcium Alginate with Silver Wound #2 Left,Anterior Lower Leg Calcium Alginate with Silver Secondary Dressing Wound #1 Right,Anterior Lower Leg Dry Gauze Wound #2 Left,Anterior Lower Leg Dry Gauze Edema Control Kerlix and Coban - Bilateral Avoid standing for long periods of time Elevate legs to the level of the heart or above for 30 minutes daily and/or when sitting, a frequency of: - throughout the day Exercise regularly Electronic Signature(s) Signed: 11/10/2020 5:04:51 PM By: Worthy Keeler PA-C Signed: 11/11/2020 2:58:18 PM By: Baruch Gouty RN, BSN Entered By: Baruch Gouty on 11/10/2020 10:18:12 -------------------------------------------------------------------------------- Problem List Details Patient Name: Date of Service: [name], Marcus [name] 11/10/2020 9:00 A M Medical Record Number: [id number] Patient Account Number: [id number] Date of Birth/Sex: Treating RN: 1951/02/16 (69 y.o. Male) Baruch Gouty Primary Care Provider: [name], MA [name] Other Clinician: Referring Provider: Treating Provider/Extender: Hal Morales, MA [name] Weeks in Treatment: 0 Active Problems ICD-10 Encounter Code Description Active Date MDM Diagnosis E11.622 Type 2 diabetes mellitus with other skin ulcer 11/10/2020 No Yes I89.0 Lymphedema, not elsewhere classified 11/10/2020 No Yes I87.2 Venous insufficiency (chronic) (peripheral) 11/10/2020 No Yes L97.812 Non-pressure chronic ulcer of other part of right lower leg with fat layer 11/10/2020 No Yes exposed L97.822 Non-pressure chronic ulcer of other part of left lower  leg with fat layer exposed11/17/2021 No Yes I25.10 Atherosclerotic heart disease of native coronary artery without angina pectoris 11/10/2020 No Yes Inactive Problems Resolved Problems Electronic Signature(s) Signed: 11/10/2020 10:09:54 AM By: Worthy Keeler PA-C Entered By: Worthy Keeler on 11/10/2020 10:09:53 --------------------------------------------------------------------------------  Progress Note Details Patient Name: Date of Service: [name]. 11/10/2020 9:00 A M Medical Record Number: [id number] Patient Account Number: [id number] Date of Birth/Sex: Treating RN: Dec 07, 1951 (69 y.o. Male) Baruch Gouty Primary Care Provider: [name], MA [name] Other Clinician: Referring Provider: Treating Provider/Extender: Hal Morales, MA [name] Weeks in Treatment: 0 Subjective Chief Complaint Information obtained from Patient Bilateral LE Ulcers History of Present Illness (HPI) 11/10/2020 patient presents today for initial evaluation in this clinic although I have seen this patient in [place] previous. Subsequently his issue today is different from when I saw him for I was treating him for a toe ulcer. Currently he is having issues with bilateral lower extremity ulcers after running into a metal bar. Upon inspection today he has wounds of the bilateral lower extremities which are consistent with having struck his legs on the anterior aspect. With that being said the patient did go to the ER for evaluation on October 11 where he was given Keflex initially. He went back to the ER after not improving on October 20 he was given Lasix at that point he states the Lasix seem to help the most. Has been using Neosporin and leaving this open area which is probably not the best way to go either to be honest. He has had arterial studies on March 2021 which showed a left ABI of 0.78 with a TBI of 0.34 and a right ABI of 1.18 with a TBI 1.18. Nonetheless his arterial flow the  left is not ideal but also I think potentially consistent with allowing this to heal. I think he can also support light compression with Kerlix and Coban based on this result. His most recent hemoglobin A1c that we could find was 7.3 and that was towards the end of 2020. The patient does have a history of heart disease unfortunately. He is also currently still a smoker. Patient History Information obtained from Patient. Allergies bee venom protein (honey bee) (Severity: Moderate), penicillin (Severity: Moderate, Reaction: free bleeding) Social History Current every day smoker - 1 PPD, Marital Status - Married, Alcohol Use - Rarely, Drug Use - No History, Caffeine Use - Daily - coffee, soda. Medical History Eyes Patient has history of Cataracts - both eyes Cardiovascular Patient has history of Arrhythmia - A-fib, Coronary Artery Disease, Hypertension, Myocardial Infarction - CABG x 4, Peripheral Arterial Disease, Peripheral Venous Disease Endocrine Patient has history of Type II Diabetes Musculoskeletal Patient has history of Osteoarthritis Neurologic Patient has history of Neuropathy Oncologic Patient has history of Received Radiation Patient is treated with Oral Agents. Blood sugar is not tested. Medical A Surgical History Notes nd Cardiovascular Hyperlipidemia, CVA, AAA Oncologic Prostate cancer Review of Systems (ROS) Constitutional Symptoms (General Health) Denies complaints or symptoms of Fatigue, Fever, Chills, Marked Weight Change. Eyes Complains or has symptoms of Glasses / Contacts - glasses. Denies complaints or symptoms of Dry Eyes, Vision Changes. Ear/Nose/Mouth/Throat Denies complaints or symptoms of Chronic sinus problems or rhinitis. Respiratory Denies complaints or symptoms of Chronic or frequent coughs, Shortness of Breath. Gastrointestinal Denies complaints or symptoms of Frequent diarrhea, Nausea, Vomiting. Genitourinary Denies complaints or symptoms of  Frequent urination. Integumentary (Skin) Complains or has symptoms of Wounds - bilateral lower leg wounds. Psychiatric Denies complaints or symptoms of Claustrophobia, Suicidal. Objective Constitutional patient is hypertensive.. pulse regular and within target range for patient.Marland Kitchen respirations regular, non-labored and within target range for patient.Marland Kitchen temperature within target range for patient.. Well-nourished and well-hydrated  in no acute distress. Vitals Time Taken: 9:34 AM, Height: 71 in, Source: Stated, Weight: 225 lbs, Source: Stated, BMI: 31.4, Temperature: 97.9 F, Pulse: 67 bpm, Respiratory Rate: 18 breaths/min, Blood Pressure: 143/83 mmHg. Eyes conjunctiva clear no eyelid edema noted. pupils equal round and reactive to light and accommodation. Ears, Nose, Mouth, and Throat no gross abnormality of ear auricles or external auditory canals. normal hearing noted during conversation. mucus membranes moist. Respiratory normal breathing without difficulty. Cardiovascular 1+ dorsalis pedis/posterior tibialis pulses. 2+ pitting edema of the bilateral lower extremities. Musculoskeletal normal gait and posture. no significant deformity or arthritic changes, no loss or range of motion, no clubbing. Psychiatric this patient is able to make decisions and demonstrates good insight into disease process. Alert and Oriented x 3. pleasant and cooperative. General Notes: Upon inspection patient's wound bed actually showed signs of good granulation at this time once I was able to clear with saline and gauze debridement off the slough on the surface of the wound currently. He tolerated all that today without complication post debridement the wound bed appears to be doing much better which is great news. No sharp debridement was necessary today. Integumentary (Hair, Skin) Wound #1 status is Open. Original cause of wound was Trauma. The wound is located on the Right,Anterior Lower Leg. The wound  measures 2cm length x 1.5cm width x 0.2cm depth; 2.356cm^2 area and 0.471cm^3 volume. There is Fat Layer (Subcutaneous Tissue) exposed. There is no tunneling or undermining noted. There is a medium amount of serosanguineous drainage noted. The wound margin is flat and intact. There is small (1-33%) red granulation within the wound bed. There is a large (67-100%) amount of necrotic tissue within the wound bed including Eschar and Adherent Slough. Wound #2 status is Open. Original cause of wound was Trauma. The wound is located on the Left,Anterior Lower Leg. The wound measures 2cm length x 2.4cm width x 0.2cm depth; 3.77cm^2 area and 0.754cm^3 volume. There is Fat Layer (Subcutaneous Tissue) exposed. There is no tunneling or undermining noted. There is a medium amount of serosanguineous drainage noted. The wound margin is flat and intact. There is medium (34-66%) red, pink granulation within the wound bed. There is a medium (34-66%) amount of necrotic tissue within the wound bed including Eschar and Adherent Slough. Assessment Active Problems ICD-10 Type 2 diabetes mellitus with other skin ulcer Lymphedema, not elsewhere classified Venous insufficiency (chronic) (peripheral) Non-pressure chronic ulcer of other part of right lower leg with fat layer exposed Non-pressure chronic ulcer of other part of left lower leg with fat layer exposed Atherosclerotic heart disease of native coronary artery without angina pectoris Plan Follow-up Appointments: Return Appointment in 2 weeks. - Wed 12/3 with Margarita Grizzle Nurse Visit: - Tues 11/23 Dressing Change Frequency: Wound #1 Right,Anterior Lower Leg: Do not change entire dressing for one week. Wound #2 Left,Anterior Lower Leg: Do not change entire dressing for one week. Skin Barriers/Peri-Wound Care: Moisturizing lotion - both legs Wound Cleansing: May shower with protection. Primary Wound Dressing: Wound #1 Right,Anterior Lower Leg: Calcium Alginate with  Silver Wound #2 Left,Anterior Lower Leg: Calcium Alginate with Silver Secondary Dressing: Wound #1 Right,Anterior Lower Leg: Dry Gauze Wound #2 Left,Anterior Lower Leg: Dry Gauze Edema Control: Kerlix and Coban - Bilateral Avoid standing for long periods of time Elevate legs to the level of the heart or above for 30 minutes daily and/or when sitting, a frequency of: - throughout the day Exercise regularly 1. Would recommend currently that we actually go ahead and  continue with the recommendation for elevation in regard to the patient's legs. Also think he needs compression regular use of Curlex and Coban wrap to try to help in this regard. 2. I am also can recommend at this time that we initiate treatment with a silver alginate dressing I think this will be best for the patient. 3. I am also can recommend that he is able to walk around and move as much as he wants I really do not want him sitting with his feet on the floor as that is really the worst position for him to begin from the standpoint of controlling his fluid and managing this in general. He voiced understanding. We will see patient back for reevaluation in 1 week here in the clinic. If anything worsens or changes patient will contact our office for additional recommendations. This initial visit will be for nurse visit and then will subsequently see him for a provider visit in 2 weeks. Electronic Signature(s) Signed: 11/10/2020 10:29:42 AM By: Worthy Keeler PA-C Entered By: Worthy Keeler on 11/10/2020 10:29:42 -------------------------------------------------------------------------------- HxROS Details Patient Name: Date of Service: [name], Marcus [name] 11/10/2020 9:00 A M Medical Record Number: [id number] Patient Account Number: [id number] Date of Birth/Sex: Treating RN: [birth year] (69 y.o. Male) [name] Primary Care Provider: [name], MA [place] Other Clinician: Referring Provider: Treating Provider/Extender:  Hal Morales, MA [name] Weeks in Treatment: 0 Information Obtained From Patient Constitutional Symptoms (General Health) Complaints and Symptoms: Negative for: Fatigue; Fever; Chills; Marked Weight Change Eyes Complaints and Symptoms: Positive for: Glasses / Contacts - glasses Negative for: Dry Eyes; Vision Changes Medical History: Positive for: Cataracts - both eyes Ear/Nose/Mouth/Throat Complaints and Symptoms: Negative for: Chronic sinus problems or rhinitis Respiratory Complaints and Symptoms: Negative for: Chronic or frequent coughs; Shortness of Breath Gastrointestinal Complaints and Symptoms: Negative for: Frequent diarrhea; Nausea; Vomiting Genitourinary Complaints and Symptoms: Negative for: Frequent urination Integumentary (Skin) Complaints and Symptoms: Positive for: Wounds - bilateral lower leg wounds Psychiatric Complaints and Symptoms: Negative for: Claustrophobia; Suicidal Hematologic/Lymphatic Cardiovascular Medical History: Positive for: Arrhythmia - A-fib; Coronary Artery Disease; Hypertension; Myocardial Infarction - CABG x 4; Peripheral Arterial Disease; Peripheral Venous Disease Past Medical History Notes: Hyperlipidemia, CVA, AAA Endocrine Medical History: Positive for: Type II Diabetes Time with diabetes: over 5 years Treated with: Oral agents Blood sugar tested every day: No Immunological Musculoskeletal Medical History: Positive for: Osteoarthritis Neurologic Medical History: Positive for: Neuropathy Oncologic Medical History: Positive for: Received Radiation Past Medical History Notes: Prostate cancer HBO Extended History Items Eyes: Cataracts Immunizations Pneumococcal Vaccine: Received Pneumococcal Vaccination: Yes Implantable Devices None Family and Social History Current every day smoker - 1 PPD; Marital Status - Married; Alcohol Use: Rarely; Drug Use: No History; Caffeine Use: Daily - coffee, soda;  Financial Concerns: No; Food, Clothing or Shelter Needs: No; Support System Lacking: No; Transportation Concerns: No Electronic Signature(s) Signed: 11/10/2020 5:04:51 PM By: Worthy Keeler PA-C Signed: 11/10/2020 5:17:00 PM By: Marcus Hurst RN, BSN Entered By: [name] on 11/10/2020 10:03:47 -------------------------------------------------------------------------------- SuperBill Details Patient Name: Date of Service: Marcus [employer] T. 11/10/2020 Medical Record Number: [id number] Patient Account Number: [id number] Date of Birth/Sex: Treating RN: April 08, 1951 (69 y.o. Male) Baruch Gouty Primary Care Provider: [name], MA [place] Other Clinician: Referring Provider: Treating Provider/Extender: Hal Morales, MA [name] Weeks in Treatment: 0 Diagnosis Coding ICD-10 Codes Code Description E11.622 Type 2 diabetes mellitus with other skin ulcer  I89.0 Lymphedema, not elsewhere classified I87.2 Venous insufficiency (chronic) (peripheral) L97.812 Non-pressure chronic ulcer of other part of right lower leg with fat layer exposed L97.822 Non-pressure chronic ulcer of other part of left lower leg with fat layer exposed I25.10 Atherosclerotic heart disease of native coronary artery without angina pectoris Facility Procedures CPT4 Code: [id number] Description: 06015 - WOUND CARE VISIT-LEV 5 EST PT Modifier: Quantity: 1 Physician Procedures : CPT4 Code Description Modifier [id number] - WC PHYS LEVEL 4 - EST PT ICD-10 Diagnosis Description E11.622 Type 2 diabetes mellitus with other skin ulcer I89.0 Lymphedema, not elsewhere classified I87.2 Venous insufficiency (chronic) (peripheral)  L97.812 Non-pressure chronic ulcer of other part of right lower leg with fat layer exposed Quantity: 1 Electronic Signature(s) Signed: 11/10/2020 10:29:58 AM By: Worthy Keeler PA-C Entered By: Worthy Keeler on 11/10/2020 10:29:58

## 2020-11-11 NOTE — Progress Notes (Signed)
[name] ([id number]) . Visit Report for 11/10/2020 Allergy List Details Patient Name: Date of Service: [name]. 11/10/2020 9:00 A M Medical Record Number: [id number] Patient Account Number: [id number] Date of Birth/Sex: Treating RN: 1951-11-20 (69 y.o. Male) Levan Hurst Primary Care [name]: [name], MA [name] Other Clinician: Referring Teegan Guinther: Treating Vannia Pola/Extender: Hal Morales, MA [name] Weeks in Treatment: 0 Allergies Active Allergies bee venom protein (honey bee) Severity: Moderate penicillin Reaction: free bleeding Severity: Moderate Allergy Notes Electronic Signature(s) Signed: 11/10/2020 5:17:00 PM By: Levan Hurst RN, BSN Entered By: Levan Hurst on 11/10/2020 09:36:14 -------------------------------------------------------------------------------- Arrival Information Details Patient Name: Date of Service: [name], Marcus [name] 11/10/2020 9:00 A M Medical Record Number: [id number] Patient Account Number: [id number] Date of Birth/Sex: Treating RN: 1951/06/29 (69 y.o. Male) Levan Hurst Primary Care Jiles Goya: [name], MA [name] Other Clinician: Referring Lateefah Mallery: Treating Ernestene Coover/Extender: Hal Morales, MA [name] Weeks in Treatment: 0 Visit Information Patient Arrived: Ambulatory Arrival Time: 09:27 Accompanied By: alone Transfer Assistance: None Patient Identification Verified: Yes Secondary Verification Process Completed: Yes Patient Requires Transmission-Based Precautions: No Patient Has Alerts: Yes Patient Alerts: Patient on Blood Thinner L ABI: 0.78 TBI: 0.34 R ABI: 1.18 TBI: 1.18 Electronic Signature(s) Signed: 11/10/2020 5:17:00 PM By: Levan Hurst RN, BSN Entered By: Levan Hurst on 11/10/2020 09:33:40 -------------------------------------------------------------------------------- Clinic Level of Care Assessment Details Patient Name: Date of Service: [name]. 11/10/2020 9:00  A M Medical Record Number: [id number] Patient Account Number: [id number] Date of Birth/Sex: Treating RN: 02-29-1951 (69 y.o. Male) Baruch Gouty Primary Care Jun Rightmyer: [name], MA [name] Other Clinician: Referring Gurtej Noyola: Treating Kashus Karlen/Extender: Worthy Keeler A BO NZA, MA [name] Weeks in Treatment: 0 Clinic Level of Care Assessment Items TOOL 2 Quantity Score []  - 0 Use when only an EandM is performed on the INITIAL visit ASSESSMENTS - Nursing Assessment / Reassessment X- 1 20 General Physical Exam (combine w/ comprehensive assessment (listed just below) when performed on new pt. evals) X- 1 25 Comprehensive Assessment (HX, ROS, Risk Assessments, Wounds Hx, etc.) ASSESSMENTS - Wound and Skin A ssessment / Reassessment []  - 0 Simple Wound Assessment / Reassessment - one wound X- 2 5 Complex Wound Assessment / Reassessment - multiple wounds []  - 0 Dermatologic / Skin Assessment (not related to wound area) ASSESSMENTS - Ostomy and/or Continence Assessment and Care []  - 0 Incontinence Assessment and Management []  - 0 Ostomy Care Assessment and Management (repouching, etc.) PROCESS - Coordination of Care X - Simple Patient / Family Education for ongoing care 1 15 []  - 0 Complex (extensive) Patient / Family Education for ongoing care X- 1 10 Staff obtains Programmer, systems, Records, T Results / Process Orders est []  - 0 Staff telephones HHA, Nursing Homes / Clarify orders / etc []  - 0 Routine Transfer to another Facility (non-emergent condition) []  - 0 Routine Hospital Admission (non-emergent condition) X- 1 15 New Admissions / Biomedical engineer / Ordering NPWT Apligraf, etc. , []  - 0 Emergency Hospital Admission (emergent condition) X- 1 10 Simple Discharge Coordination []  - 0 Complex (extensive) Discharge Coordination PROCESS - Special Needs []  - 0 Pediatric / Minor Patient Management []  - 0 Isolation Patient Management []  - 0 Hearing / Language / Visual  special needs []  - 0 Assessment of Community assistance (transportation, D/C planning, etc.) []  - 0 Additional assistance / Altered mentation []  - 0 Support Surface(s) Assessment (bed, cushion, seat,  etc.) INTERVENTIONS - Wound Cleansing / Measurement X- 1 5 Wound Imaging (photographs - any number of wounds) []  - 0 Wound Tracing (instead of photographs) []  - 0 Simple Wound Measurement - one wound X- 2 5 Complex Wound Measurement - multiple wounds []  - 0 Simple Wound Cleansing - one wound X- 2 5 Complex Wound Cleansing - multiple wounds INTERVENTIONS - Wound Dressings []  - 0 Small Wound Dressing one or multiple wounds X- 2 15 Medium Wound Dressing one or multiple wounds []  - 0 Large Wound Dressing one or multiple wounds []  - 0 Application of Medications - injection INTERVENTIONS - Miscellaneous []  - 0 External ear exam []  - 0 Specimen Collection (cultures, biopsies, blood, body fluids, etc.) []  - 0 Specimen(s) / Culture(s) sent or taken to Lab for analysis []  - 0 Patient Transfer (multiple staff / Civil Service fast streamer / Similar devices) []  - 0 Simple Staple / Suture removal (25 or less) []  - 0 Complex Staple / Suture removal (26 or more) []  - 0 Hypo / Hyperglycemic Management (close monitor of Blood Glucose) []  - 0 Ankle / Brachial Index (ABI) - do not check if billed separately Has the patient been seen at the hospital within the last three years: Yes Total Score: 160 Level Of Care: New/Established - Level 5 Electronic Signature(s) Signed: 11/11/2020 2:58:18 PM By: Baruch Gouty RN, BSN Entered By: Baruch Gouty on 11/10/2020 10:19:16 -------------------------------------------------------------------------------- Encounter Discharge Information Details Patient Name: Date of Service: [name], Marcus [name] 11/10/2020 9:00 A M Medical Record Number: [id number] Patient Account Number: [id number] Date of Birth/Sex: Treating RN: 28-Dec-1950 (69 y.o. Male) [name] Primary Care Russel Morain: [name], MA [name] Other Clinician: Referring Rhyder Koegel: Treating Faigy Stretch/Extender: Hal Morales, MA [name] Weeks in Treatment: 0 Encounter Discharge Information Items Discharge Condition: Stable Ambulatory Status: Ambulatory Discharge Destination: Home Transportation: Private Auto Accompanied By: self Schedule Follow-up Appointment: Yes Clinical Summary of Care: Patient Declined Electronic Signature(s) Signed: 11/10/2020 5:02:10 PM By: [name] RN Entered By: [name] on 11/10/2020 10:39:18 -------------------------------------------------------------------------------- Lower Extremity Assessment Details Patient Name: Date of Service: [name] 11/10/2020 9:00 A M Medical Record Number: [id number] Patient Account Number: [id number] Date of Birth/Sex: Treating RN: [birth year] (69 y.o. Male) Levan Hurst Primary Care Chianne Byrns: [name], MA [name] Other Clinician: Referring Elektra Wartman: Treating Jamiah Recore/Extender: Darrol Poke BO NZA, MA [name] Weeks in Treatment: 0 Edema Assessment Assessed: [Left: No] [Right: No] Edema: [Left: Yes] [Right: Yes] Calf Left: Right: Point of Measurement: 35 cm From Medial Instep 39 cm 41 cm Ankle Left: Right: Point of Measurement: 11 cm From Medial Instep 25.5 cm 25.5 cm Vascular Assessment Pulses: Dorsalis Pedis Palpable: [Left:Yes] [Right:Yes] Electronic Signature(s) Signed: 11/10/2020 5:17:00 PM By: Levan Hurst RN, BSN Entered By: Levan Hurst on 11/10/2020 09:47:42 -------------------------------------------------------------------------------- [place] [facility] Details Patient Name: Date of Service: [name], Marcus [name] 11/10/2020 9:00 A M Medical Record Number: [id number] Patient Account Number: [id number] Date of Birth/Sex: Treating RN: 09-Dec-1951 (69 y.o. Male) Baruch Gouty Primary Care Alleigh Mollica: [name], MA [name] Other Clinician: Referring  Taksh Hjort: Treating Keymani Mclean/Extender: Darrol Poke BO NZA, MA [name] Weeks in Treatment: 0 Active Inactive Nutrition Nursing Diagnoses: Impaired glucose control: actual or potential Potential for alteratiion in Nutrition/Potential for imbalanced nutrition Goals: Patient/caregiver will maintain therapeutic glucose control Date Initiated: 11/10/2020 Target Resolution Date: 12/08/2020 Goal Status: Active Interventions: Assess HgA1c results as ordered upon admission and as needed Provide  education on elevated blood sugars and impact on wound healing Treatment Activities: Patient referred to Primary Care Physician for further nutritional evaluation : 11/10/2020 Notes: Venous Leg Ulcer Nursing Diagnoses: Knowledge deficit related to disease process and management Potential for venous Insuffiency (use before diagnosis confirmed) Goals: Patient will maintain optimal edema control Date Initiated: 11/10/2020 Target Resolution Date: 12/08/2020 Goal Status: Active Interventions: Assess peripheral edema status every visit. Compression as ordered Provide education on venous insufficiency Treatment Activities: Therapeutic compression applied : 11/10/2020 Notes: Wound/Skin Impairment Nursing Diagnoses: Impaired tissue integrity Knowledge deficit related to smoking impact on wound healing Knowledge deficit related to ulceration/compromised skin integrity Goals: Patient will demonstrate a reduced rate of smoking or cessation of smoking Date Initiated: 11/10/2020 Target Resolution Date: 12/08/2020 Goal Status: Active Patient/caregiver will verbalize understanding of skin care regimen Date Initiated: 11/10/2020 Target Resolution Date: 12/08/2020 Goal Status: Active Ulcer/skin breakdown will have a volume reduction of 30% by week 4 Date Initiated: 11/10/2020 Target Resolution Date: 12/08/2020 Goal Status: Active Interventions: Assess patient/caregiver ability to obtain necessary  supplies Assess patient/caregiver ability to perform ulcer/skin care regimen upon admission and as needed Assess ulceration(s) every visit Treatment Activities: Skin care regimen initiated : 11/10/2020 Topical wound management initiated : 11/10/2020 Notes: Electronic Signature(s) Signed: 11/11/2020 2:58:18 PM By: Baruch Gouty RN, BSN Entered By: Baruch Gouty on 11/10/2020 10:14:17 -------------------------------------------------------------------------------- Pain Assessment Details Patient Name: Date of Service: [name], Marcus [name] 11/10/2020 9:00 A M Medical Record Number: [id number] Patient Account Number: [id number] Date of Birth/Sex: Treating RN: 05-Aug-1951 (69 y.o. Male) Levan Hurst Primary Care Yenni Carra: [name], MA [place] Other Clinician: Referring Tine Mabee: Treating Floretta Petro/Extender: Darrol Poke BO NZA, MA [name] Weeks in Treatment: 0 Active Problems Location of Pain Severity and Description of Pain Patient Has Paino Yes Site Locations Pain Location: Pain Location: Pain in Ulcers With Dressing Change: Yes Duration of the Pain. Constant / Intermittento Intermittent Rate the pain. Current Pain Level: 3 Worst Pain Level: 6 Character of Pain Describe the Pain: Sharp, Tender Pain Management and Medication Current Pain Management: Medication: Yes Cold Application: No Rest: No Massage: No Activity: No SandovalE.N.S.: No Heat Application: No Leg drop or elevation: No Is the Current Pain Management Adequate: Adequate How does your wound impact your activities of daily livingo Sleep: No Bathing: No Appetite: No Relationship With Others: No Bladder Continence: No Emotions: No Bowel Continence: No Work: No Toileting: No Drive: No Dressing: No Hobbies: No Electronic Signature(s) Signed: 11/10/2020 5:17:00 PM By: Levan Hurst RN, BSN Entered By: Levan Hurst on 11/10/2020  09:52:41 -------------------------------------------------------------------------------- Patient/Caregiver Education Details Patient Name: Date of Service: [name] 11/17/2021andnbsp9:00 A M Medical Record Number: [id number] Patient Account Number: [id number] Date of Birth/Gender: Treating RN: [birth year] (69 y.o. Male) Baruch Gouty Primary Care Physician: [name], MA [name] Other Clinician: Referring Physician: Treating Physician/Extender: Hal Morales, MA [name] Weeks in Treatment: 0 Education Assessment Education Provided To: Patient Education Topics Provided Elevated Blood Sugar/ Impact on Healing: Methods: Explain/Verbal Responses: Reinforcements needed, State content correctly Venous: Handouts: Controlling Swelling with Multilayered Compression Wraps Methods: Explain/Verbal, Printed Responses: Reinforcements needed, State content correctly Welcome T [facility]: o Handouts: Welcome T [facility] [place] o Methods: Explain/Verbal, Printed Responses: Reinforcements needed, State content correctly Wound/Skin Impairment: Methods: Explain/Verbal Responses: Reinforcements needed, State content correctly Electronic Signature(s) Signed: 11/11/2020 2:58:18 PM By: Baruch Gouty RN, BSN Entered By: Baruch Gouty on 11/10/2020 10:15:10 -------------------------------------------------------------------------------- Wound Assessment Details  Patient Name: Date of Service: [name]. 11/10/2020 9:00 A M Medical Record Number: [id number] Patient Account Number: [id number] Date of Birth/Sex: Treating RN: 02/03/1951 (69 y.o. Male) Levan Hurst Primary Care Shyloh Krinke: [name], MA [name] Other Clinician: Referring Aysa Larivee: Treating Marley Charlot/Extender: Darrol Poke BO NZA, MA [name] Weeks in Treatment: 0 Wound Status Wound Number: 1 Primary Venous Leg Ulcer Etiology: Wound Location: Right, Anterior Lower Leg Wound  Open Wounding Event: Trauma Status: Date Acquired: 10/08/2020 Comorbid Cataracts, Arrhythmia, Coronary Artery Disease, Hypertension, Weeks Of Treatment: 0 History: Myocardial Infarction, Peripheral Arterial Disease, Peripheral Clustered Wound: No Venous Disease, Type II Diabetes, Osteoarthritis, Neuropathy, Received Radiation Photos Photo Uploaded By: Mikeal Hawthorne on 11/11/2020 13:57:55 Wound Measurements Length: (cm) 2 Width: (cm) 1.5 Depth: (cm) 0.2 Area: (cm) 2.356 Volume: (cm) 0.471 % Reduction in Area: % Reduction in Volume: Epithelialization: None Tunneling: No Undermining: No Wound Description Classification: Full Thickness Without Exposed Support Structures Wound Margin: Flat and Intact Exudate Amount: Medium Exudate Type: Serosanguineous Exudate Color: red, brown Foul Odor After Cleansing: No Slough/Fibrino Yes Wound Bed Granulation Amount: Small (1-33%) Exposed Structure Granulation Quality: Red Fascia Exposed: No Necrotic Amount: Large (67-100%) Fat Layer (Subcutaneous Tissue) Exposed: Yes Necrotic Quality: Eschar, Adherent Slough Tendon Exposed: No Muscle Exposed: No Joint Exposed: No Bone Exposed: No Electronic Signature(s) Signed: 11/10/2020 5:17:00 PM By: Levan Hurst RN, BSN Entered By: Levan Hurst on 11/10/2020 09:49:13 -------------------------------------------------------------------------------- Wound Assessment Details Patient Name: Date of Service: [name], Marcus [name] 11/10/2020 9:00 A M Medical Record Number: [id number] Patient Account Number: [id number] Date of Birth/Sex: Treating RN: [birth year] (69 y.o. Male) Levan Hurst Primary Care Rayder Sullenger: [name], MA [name] Other Clinician: Referring Maurice Fotheringham: Treating Kimberlyn Quiocho/Extender: Darrol Poke BO NZA, MA [name] Weeks in Treatment: 0 Wound Status Wound Number: 2 Primary Venous Leg Ulcer Etiology: Wound Location: Left, Anterior Lower Leg Wound Open Wounding Event:  Trauma Status: Date Acquired: 09/24/2020 Comorbid Cataracts, Arrhythmia, Coronary Artery Disease, Hypertension, Weeks Of Treatment: 0 History: Myocardial Infarction, Peripheral Arterial Disease, Peripheral Clustered Wound: No Venous Disease, Type II Diabetes, Osteoarthritis, Neuropathy, Received Radiation Photos Photo Uploaded By: Mikeal Hawthorne on 11/11/2020 13:57:55 Wound Measurements Length: (cm) 2 Width: (cm) 2.4 Depth: (cm) 0.2 Area: (cm) 3.77 Volume: (cm) 0.754 % Reduction in Area: % Reduction in Volume: Epithelialization: None Tunneling: No Undermining: No Wound Description Classification: Full Thickness Without Exposed Support Structures Wound Margin: Flat and Intact Exudate Amount: Medium Exudate Type: Serosanguineous Exudate Color: red, brown Foul Odor After Cleansing: No Slough/Fibrino Yes Wound Bed Granulation Amount: Medium (34-66%) Exposed Structure Granulation Quality: Red, Pink Fascia Exposed: No Necrotic Amount: Medium (34-66%) Fat Layer (Subcutaneous Tissue) Exposed: Yes Necrotic Quality: Eschar, Adherent Slough Tendon Exposed: No Muscle Exposed: No Joint Exposed: No Bone Exposed: No Treatment Notes Wound #2 (Left, Anterior Lower Leg) 1. Cleanse With Wound Cleanser 3. Primary Dressing Applied Calcium Alginate Ag 4. Secondary Dressing Dry Gauze 6. Support Layer Applied Kerlix/Coban Notes Horticulturist, commercial) Signed: 11/10/2020 5:17:00 PM By: Levan Hurst RN, BSN Entered By: Levan Hurst on 11/10/2020 09:52:06 -------------------------------------------------------------------------------- Vitals Details Patient Name: Date of Service: [name], Marcus [name] 11/10/2020 9:00 A M Medical Record Number: [id number] Patient Account Number: [id number] Date of Birth/Sex: Treating RN: 09/14/1951 (69 y.o. Male) Levan Hurst Primary Care Soley Harriss: [name], MA [place] Other Clinician: Referring Aimee Heldman: Treating Timberlyn Pickford/Extender:  Darrol Poke BO NZA, MA [name] Weeks in Treatment: 0 Vital Signs Time Taken: 09:34 Temperature (F):  97.9 Height (in): 71 Pulse (bpm): 67 Source: Stated Respiratory Rate (breaths/min): 18 Weight (lbs): 225 Blood Pressure (mmHg): 143/83 Source: Stated Reference Range: 80 - 120 mg / dl Body Mass Index (BMI): 31.4 Electronic Signature(s) Signed: 11/10/2020 5:17:00 PM By: Levan Hurst RN, BSN Entered By: Levan Hurst on 11/10/2020 09:34:52

## 2020-11-15 ENCOUNTER — Telehealth: Payer: Self-pay | Admitting: Cardiovascular Disease

## 2020-11-15 MED ORDER — APIXABAN 5 MG PO TABS
5.0000 mg | ORAL_TABLET | Freq: Two times a day (BID) | ORAL | 0 refills | Status: DC
Start: 2020-11-15 — End: 2021-01-04

## 2020-11-15 NOTE — Telephone Encounter (Signed)
Patient calling the office for samples of medication:   1.  What medication and dosage are you requesting samples for? ELIQUIS 5 MG TABS tablet   2.  Are you currently out of this medication? Yes    

## 2020-11-15 NOTE — Telephone Encounter (Signed)
Made pt a follow up visit with Dr. Sallyanne Kuster for 03/15/21. Refilled Eliquis 5 mg by mouth twice a day. 2 weeks' worth of Eliquis samples left at the check-in desk. The patient verbalizes understanding and agreement with plan.

## 2020-11-16 ENCOUNTER — Encounter (HOSPITAL_BASED_OUTPATIENT_CLINIC_OR_DEPARTMENT_OTHER): Payer: PPO | Admitting: Internal Medicine

## 2020-11-16 ENCOUNTER — Other Ambulatory Visit: Payer: Self-pay

## 2020-11-16 DIAGNOSIS — E11622 Type 2 diabetes mellitus with other skin ulcer: Secondary | ICD-10-CM | POA: Diagnosis not present

## 2020-11-16 NOTE — Progress Notes (Signed)
[name], [name] ([id number]) . Visit Report for 11/16/2020 Arrival Information Details Patient Name: Date of Service: [name]. 11/16/2020 9:00 A M Medical Record Number: [id number] Patient Account Number: [id number] Date of Birth/Sex: Treating RN: 13-Jul-1951 (69 y.o. [name] Primary Care [name]: Marcus Mallick, MA [name] Other Clinician: Referring Kairyn Olmeda: Treating Miracle Criado/Extender: Marcus Asters, MA [name] Weeks in Treatment: 0 Visit Information History Since Last Visit Added or deleted any medications: No Patient Arrived: Ambulatory Any new allergies or adverse reactions: No Arrival Time: 08:56 Had a fall or experienced change in No Accompanied By: self activities of daily living that may affect Transfer Assistance: None risk of falls: Patient Identification Verified: Yes Signs or symptoms of abuse/neglect since last visito No Secondary Verification Process Completed: Yes Hospitalized since last visit: No Patient Requires Transmission-Based Precautions: No Implantable device outside of the clinic excluding No Patient Has Alerts: Yes cellular tissue based products placed in the center Patient Alerts: Patient on Blood Thinner since last visit: L ABI: 0.78 TBI: 0.34 Has Dressing in Place as Prescribed: Yes R ABI: 1.18 TBI: 1.18 Has Compression in Place as Prescribed: Yes Pain Present Now: Yes Electronic Signature(s) Signed: 11/16/2020 2:07:57 PM By: Baruch Gouty RN, BSN Entered By: Baruch Gouty on 11/16/2020 08:56:32 -------------------------------------------------------------------------------- Clinic Level of Care Assessment Details Patient Name: Date of Service: [name], Marcus [name] 11/16/2020 9:00 A M Medical Record Number: [id number] Patient Account Number: [id number] Date of Birth/Sex: Treating RN: 08-Feb-1951 (69 y.o. [name] Primary Care [name]: Marcus Mallick, MA [name] Other Clinician: Referring Hager Compston: Treating  Aqeel Norgaard/Extender: Marcus Asters, MA [name] Weeks in Treatment: 0 Clinic Level of Care Assessment Items TOOL 4 Quantity Score []  - 0 Use when only an EandM is performed on FOLLOW-UP visit ASSESSMENTS - Nursing Assessment / Reassessment X- 1 10 Reassessment of Co-morbidities (includes updates in patient status) X- 1 5 Reassessment of Adherence to Treatment Plan ASSESSMENTS - Wound and Skin A ssessment / Reassessment []  - 0 Simple Wound Assessment / Reassessment - one wound X- 2 5 Complex Wound Assessment / Reassessment - multiple wounds []  - 0 Dermatologic / Skin Assessment (not related to wound area) ASSESSMENTS - Focused Assessment []  - 0 Circumferential Edema Measurements - multi extremities []  - 0 Nutritional Assessment / Counseling / Intervention []  - 0 Lower Extremity Assessment (monofilament, tuning fork, pulses) []  - 0 Peripheral Arterial Disease Assessment (using hand held doppler) ASSESSMENTS - Ostomy and/or Continence Assessment and Care []  - 0 Incontinence Assessment and Management []  - 0 Ostomy Care Assessment and Management (repouching, etc.) PROCESS - Coordination of Care X - Simple Patient / Family Education for ongoing care 1 15 []  - 0 Complex (extensive) Patient / Family Education for ongoing care X- 1 10 Staff obtains Programmer, systems, Records, T Results / Process Orders est []  - 0 Staff telephones HHA, Nursing Homes / Clarify orders / etc []  - 0 Routine Transfer to another Facility (non-emergent condition) []  - 0 Routine Hospital Admission (non-emergent condition) []  - 0 New Admissions / Biomedical engineer / Ordering NPWT Apligraf, etc. , []  - 0 Emergency Hospital Admission (emergent condition) X- 1 10 Simple Discharge Coordination []  - 0 Complex (extensive) Discharge Coordination PROCESS - Special Needs []  - 0 Pediatric / Minor Patient Management []  - 0 Isolation Patient Management []  - 0 Hearing / Language / Visual special  needs []  - 0 Assessment of Community assistance (transportation, D/C planning, etc.) []  -  0 Additional assistance / Altered mentation []  - 0 Support Surface(s) Assessment (bed, cushion, seat, etc.) INTERVENTIONS - Wound Cleansing / Measurement []  - 0 Simple Wound Cleansing - one wound X- 2 5 Complex Wound Cleansing - multiple wounds X- 1 5 Wound Imaging (photographs - any number of wounds) []  - 0 Wound Tracing (instead of photographs) []  - 0 Simple Wound Measurement - one wound X- 2 5 Complex Wound Measurement - multiple wounds INTERVENTIONS - Wound Dressings []  - 0 Small Wound Dressing one or multiple wounds X- 2 15 Medium Wound Dressing one or multiple wounds []  - 0 Large Wound Dressing one or multiple wounds X- 1 5 Application of Medications - topical []  - 0 Application of Medications - injection INTERVENTIONS - Miscellaneous []  - 0 External ear exam []  - 0 Specimen Collection (cultures, biopsies, blood, body fluids, etc.) []  - 0 Specimen(s) / Culture(s) sent or taken to Lab for analysis []  - 0 Patient Transfer (multiple staff / Civil Service fast streamer / Similar devices) []  - 0 Simple Staple / Suture removal (25 or less) []  - 0 Complex Staple / Suture removal (26 or more) []  - 0 Hypo / Hyperglycemic Management (close monitor of Blood Glucose) []  - 0 Ankle / Brachial Index (ABI) - do not check if billed separately X- 1 5 Vital Signs Has the patient been seen at the hospital within the last three years: Yes Total Score: 125 Level Of Care: New/Established - Level 4 Electronic Signature(s) Signed: 11/16/2020 2:07:57 PM By: Baruch Gouty RN, BSN Entered By: Baruch Gouty on 11/16/2020 09:00:55 -------------------------------------------------------------------------------- Encounter Discharge Information Details Patient Name: Date of Service: [name], Marcus [name] 11/16/2020 9:00 A M Medical Record Number: [id number] Patient Account Number: [id number] Date of  Birth/Sex: Treating RN: [birth year] (69 y.o. [name] Primary Care Ryhanna Dunsmore: Marcus Mallick, MA [name] Other Clinician: Referring Zonie Crutcher: Treating Kyndal Heringer/Extender: Marcus Asters, MA [name] Weeks in Treatment: 0 Encounter Discharge Information Items Discharge Condition: Stable Ambulatory Status: Ambulatory Discharge Destination: Home Transportation: Private Auto Accompanied By: self Schedule Follow-up Appointment: Yes Clinical Summary of Care: Patient Declined Electronic Signature(s) Signed: 11/16/2020 2:07:57 PM By: Baruch Gouty RN, BSN Entered By: Baruch Gouty on 11/16/2020 09:01:56 -------------------------------------------------------------------------------- Patient/Caregiver Education Details Patient Name: Date of Service: Marcus Murlean Hark 11/23/2021andnbsp9:00 A M Medical Record Number: [id number] Patient Account Number: [id number] Date of Birth/Gender: Treating RN: Feb 05, 1951 (69 y.o. [name] Primary Care Physician: Marcus Mallick, MA [name] Other Clinician: Referring Physician: Treating Physician/Extender: Marcus Asters, MA [name] Weeks in Treatment: 0 Education Assessment Education Provided To: Patient Education Topics Provided Elevated Blood Sugar/ Impact on Healing: Methods: Explain/Verbal Responses: Reinforcements needed, State content correctly Venous: Methods: Explain/Verbal Responses: Reinforcements needed, State content correctly Electronic Signature(s) Signed: 11/16/2020 2:07:57 PM By: Baruch Gouty RN, BSN Entered By: Baruch Gouty on 11/16/2020 09:01:42 -------------------------------------------------------------------------------- Wound Assessment Details Patient Name: Date of Service: [name], Marcus [name] 11/16/2020 9:00 A M Medical Record Number: [id number] Patient Account Number: [id number] Date of Birth/Sex: Treating RN: [birth year] (69 y.o. [name] Primary Care Shante Archambeault: Marcus Mallick, MA  [name] Other Clinician: Referring Cerria Randhawa: Treating Dalyce Renne/Extender: Marcus Asters, MA [name] Weeks in Treatment: 0 Wound Status Wound Number: 1 Primary Venous Leg Ulcer Etiology: Wound Location: Right, Anterior Lower Leg Wound Open Wounding Event: Trauma Status: Date Acquired: 09/24/2020 Comorbid Cataracts, Arrhythmia, Coronary Artery Disease, Hypertension, Weeks Of Treatment: 0 History: Myocardial Infarction, Peripheral Arterial Disease, Peripheral  Clustered Wound: No Venous Disease, Type II Diabetes, Osteoarthritis, Neuropathy, Received Radiation Wound Measurements Length: (cm) 2 Width: (cm) 1.5 Depth: (cm) 0.2 Area: (cm) 2.356 Volume: (cm) 0.471 % Reduction in Area: 0% % Reduction in Volume: 0% Epithelialization: None Tunneling: No Undermining: No Wound Description Classification: Full Thickness Without Exposed Support Structures Wound Margin: Flat and Intact Exudate Amount: Medium Exudate Type: Serosanguineous Exudate Color: red, brown Foul Odor After Cleansing: No Slough/Fibrino Yes Wound Bed Granulation Amount: Medium (34-66%) Exposed Structure Granulation Quality: Red Fascia Exposed: No Necrotic Amount: Medium (34-66%) Fat Layer (Subcutaneous Tissue) Exposed: Yes Necrotic Quality: Adherent Slough Tendon Exposed: No Muscle Exposed: No Joint Exposed: No Bone Exposed: No Treatment Notes Wound #1 (Right, Anterior Lower Leg) 2. Periwound Care Moisturizing lotion 3. Primary Dressing Applied Calcium Alginate Ag 4. Secondary Dressing Dry Gauze 6. Support Layer Holiday representative) Signed: 11/16/2020 2:07:57 PM By: Baruch Gouty RN, BSN Entered By: Baruch Gouty on 11/16/2020 08:59:41 -------------------------------------------------------------------------------- Wound Assessment Details Patient Name: Date of Service: [name], Marcus [name] 11/16/2020 9:00 A M Medical Record Number: [id number] Patient Account  Number: [id number] Date of Birth/Sex: Treating RN: 14-Oct-1951 (69 y.o. [name] Primary Care Adrean Heitz: Marcus Mallick, MA [name] Other Clinician: Referring Wilver Tignor: Treating Kelyn Ponciano/Extender: Marcus Asters, MA [name] Weeks in Treatment: 0 Wound Status Wound Number: 2 Primary Venous Leg Ulcer Etiology: Wound Location: Left, Anterior Lower Leg Wound Open Wounding Event: Trauma Status: Date Acquired: 09/24/2020 Comorbid Cataracts, Arrhythmia, Coronary Artery Disease, Hypertension, Weeks Of Treatment: 0 History: Myocardial Infarction, Peripheral Arterial Disease, Peripheral Clustered Wound: No Venous Disease, Type II Diabetes, Osteoarthritis, Neuropathy, Received Radiation Wound Measurements Length: (cm) 2 Width: (cm) 2.4 Depth: (cm) 0.2 Area: (cm) 3.77 Volume: (cm) 0.754 % Reduction in Area: 0% % Reduction in Volume: 0% Epithelialization: None Tunneling: No Undermining: No Wound Description Classification: Full Thickness Without Exposed Support Structures Wound Margin: Flat and Intact Exudate Amount: Medium Exudate Type: Serosanguineous Exudate Color: red, brown Foul Odor After Cleansing: No Slough/Fibrino Yes Wound Bed Granulation Amount: Medium (34-66%) Exposed Structure Granulation Quality: Red, Pink Fascia Exposed: No Necrotic Amount: Medium (34-66%) Fat Layer (Subcutaneous Tissue) Exposed: Yes Necrotic Quality: Adherent Slough Tendon Exposed: No Muscle Exposed: No Joint Exposed: No Bone Exposed: No Treatment Notes Wound #2 (Left, Anterior Lower Leg) 2. Periwound Care Moisturizing lotion 3. Primary Dressing Applied Calcium Alginate Ag 4. Secondary Dressing Dry Gauze 6. Support Layer Holiday representative) Signed: 11/16/2020 2:07:57 PM By: Baruch Gouty RN, BSN Entered By: Baruch Gouty on 11/16/2020 09:00:08 -------------------------------------------------------------------------------- [place]  Details Patient Name: Date of Service: [name], Marcus [name] 11/16/2020 9:00 A M Medical Record Number: [id number] Patient Account Number: [id number] Date of Birth/Sex: Treating RN: 08-21-1951 (69 y.o. [name] Primary Care Jaimen Melone: Marcus Mallick, MA [name] Other Clinician: Referring Suhaila Troiano: Treating Thurman Sarver/Extender: Marcus Asters, MA [name] Weeks in Treatment: 0 Vital Signs Time Taken: 08:56 Temperature (F): 97.9 Height (in): 71 Pulse (bpm): 85 Source: Stated Respiratory Rate (breaths/min): 18 Weight (lbs): 225 Blood Pressure (mmHg): 153/82 Source: Stated Reference Range: 80 - 120 mg / dl Body Mass Index (BMI): 31.4 Electronic Signature(s) Signed: 11/16/2020 2:07:57 PM By: Baruch Gouty RN, BSN Entered By: Baruch Gouty on 11/16/2020 08:57:24

## 2020-11-22 ENCOUNTER — Other Ambulatory Visit: Payer: Self-pay | Admitting: Physician Assistant

## 2020-11-22 NOTE — Progress Notes (Signed)
BURK, HOCTOR ([id number]) . Visit Report for 11/16/2020 SuperBill Details Patient Name: Date of Service: [name]. 11/16/2020 Medical Record Number: [id number] Patient Account Number: [id number] Date of Birth/Sex: Treating RN: May 19, 1951 (69 y.o. [name] Primary Care Provider: [name], MA [name] Other Clinician: Referring Provider: Treating Provider/Extender: Ronney Asters, MA [name] Weeks in Treatment: 0 Diagnosis Coding ICD-10 Codes Code Description E11.622 Type 2 diabetes mellitus with other skin ulcer I89.0 Lymphedema, not elsewhere classified I87.2 Venous insufficiency (chronic) (peripheral) L97.812 Non-pressure chronic ulcer of other part of right lower leg with fat layer exposed L97.822 Non-pressure chronic ulcer of other part of left lower leg with fat layer exposed I25.10 Atherosclerotic heart disease of native coronary artery without angina pectoris Facility Procedures CPT4 Code Description Modifier Quantity [id number] - WOUND CARE VISIT-LEV 4 EST PT 1 Electronic Signature(s) Signed: 11/16/2020 2:07:57 PM By: Baruch Gouty RN, BSN Signed: 11/22/2020 4:47:57 PM By: Linton Ham MD Entered By: Baruch Gouty on 11/16/2020 09:02:07

## 2020-11-24 ENCOUNTER — Encounter (HOSPITAL_BASED_OUTPATIENT_CLINIC_OR_DEPARTMENT_OTHER): Payer: PPO | Attending: Physician Assistant | Admitting: Physician Assistant

## 2020-11-24 ENCOUNTER — Other Ambulatory Visit: Payer: Self-pay

## 2020-11-24 DIAGNOSIS — I872 Venous insufficiency (chronic) (peripheral): Secondary | ICD-10-CM | POA: Diagnosis not present

## 2020-11-24 DIAGNOSIS — I890 Lymphedema, not elsewhere classified: Secondary | ICD-10-CM | POA: Insufficient documentation

## 2020-11-24 DIAGNOSIS — L97812 Non-pressure chronic ulcer of other part of right lower leg with fat layer exposed: Secondary | ICD-10-CM | POA: Diagnosis not present

## 2020-11-24 DIAGNOSIS — E11622 Type 2 diabetes mellitus with other skin ulcer: Secondary | ICD-10-CM | POA: Insufficient documentation

## 2020-11-24 DIAGNOSIS — L97822 Non-pressure chronic ulcer of other part of left lower leg with fat layer exposed: Secondary | ICD-10-CM | POA: Diagnosis not present

## 2020-11-24 DIAGNOSIS — I2510 Atherosclerotic heart disease of native coronary artery without angina pectoris: Secondary | ICD-10-CM | POA: Diagnosis not present

## 2020-11-24 NOTE — Progress Notes (Addendum)
KALIEL, BOLDS ([id number]) . Visit Report for 11/24/2020 Chief Complaint Document Details Patient Name: Date of Service: [name] 11/24/2020 8:30 A M Medical Record Number: [id number] Patient Account Number: [id number] Date of Birth/Sex: Treating RN: December 27, 1950 (70 y.o. [name] Primary Care Provider: [name], MA [name] Other Clinician: Referring Provider: Treating Provider/Extender: Hal Morales, MA [name] Weeks in Treatment: 2 Information Obtained from: Patient Chief Complaint Bilateral LE Ulcers Electronic Signature(s) Signed: 11/24/2020 8:21:00 AM By: Worthy Keeler PA-C Entered By: Worthy Keeler on 11/24/2020 08:21:00 -------------------------------------------------------------------------------- HPI Details Patient Name: Date of Service: [name], [name] 11/24/2020 8:30 A M Medical Record Number: [id number] Patient Account Number: [id number] Date of Birth/Sex: Treating RN: 1951-08-11 (69 y.o. [name] Primary Care Provider: [name], MA [name] Other Clinician: Referring Provider: Treating Provider/Extender: Hal Morales, MA [name] Weeks in Treatment: 2 History of Present Illness HPI Description: 11/10/2020 patient presents today for initial evaluation in this clinic although I have seen this patient in [place] previous. Subsequently his issue today is different from when I saw him for I was treating him for a toe ulcer. Currently he is having issues with bilateral lower extremity ulcers after running into a metal bar. Upon inspection today he has wounds of the bilateral lower extremities which are consistent with having struck his legs on the anterior aspect. With that being said the patient did go to the ER for evaluation on October 11 where he was given Keflex initially. He went back to the ER after not improving on October 20 he was given Lasix at that point he states the Lasix seem to help the most. Has been  using Neosporin and leaving this open area which is probably not the best way to go either to be honest. He has had arterial studies on March 2021 which showed a left ABI of 0.78 with a TBI of 0.34 and a right ABI of 1.18 with a TBI 1.18. Nonetheless his arterial flow the left is not ideal but also I think potentially consistent with allowing this to heal. I think he can also support light compression with Kerlix and Coban based on this result. His most recent hemoglobin A1c that we could find was 7.3 and that was towards the end of 2020. The patient does have a history of heart disease unfortunately. He is also currently still a smoker. 11/24/2020 on evaluation today patient appears to be doing well with regard to his wounds. In fact both appear to be doing better after last week's rather last visit's debridement. Fortunately there is no signs of active infection at this time. No fevers, chills, nausea, vomiting, or diarrhea. Overall I am very pleased with where things stand today. Electronic Signature(s) Signed: 11/24/2020 9:00:49 AM By: Worthy Keeler PA-C Entered By: Worthy Keeler on 11/24/2020 09:00:48 -------------------------------------------------------------------------------- Physical Exam Details Patient Name: Date of Service: [name], [name] 11/24/2020 8:30 A M Medical Record Number: [id number] Patient Account Number: [id number] Date of Birth/Sex: Treating RN: 04-17-1951 (69 y.o. [name] Primary Care Provider: [name], MA [name] Other Clinician: Referring Provider: Treating Provider/Extender: Hal Morales, MA [name] Weeks in Treatment: 2 Constitutional Well-nourished and well-hydrated in no acute distress. Respiratory normal breathing without difficulty. Psychiatric this patient is able to make decisions and demonstrates good insight into disease process. Alert and Oriented x 3. pleasant and cooperative.  Notes Upon inspection patient's wound bed  actually showed signs of good granulation at this time. There does not appear to be any evidence of active infection. No fevers, chills, nausea, vomiting, or diarrhea. Overall I feel like there is a little bit of hyper granulation but otherwise the wound bed seems to be doing quite well Electronic Signature(s) Signed: 11/24/2020 9:01:15 AM By: Worthy Keeler PA-C Entered By: Worthy Keeler on 11/24/2020 09:01:15 -------------------------------------------------------------------------------- Physician Orders Details Patient Name: Date of Service: [name], [name] 11/24/2020 8:30 A M Medical Record Number: [id number] Patient Account Number: [id number] Date of Birth/Sex: Treating RN: Sep 26, 1951 (69 y.o. [name], [name] Primary Care Provider: [name], MA [name] Other Clinician: Referring Provider: Treating Provider/Extender: Hal Morales, MA [name] Weeks in Treatment: 2 Verbal / Phone Orders: No Diagnosis Coding ICD-10 Coding Code Description E11.622 Type 2 diabetes mellitus with other skin ulcer I89.0 Lymphedema, not elsewhere classified I87.2 Venous insufficiency (chronic) (peripheral) L97.812 Non-pressure chronic ulcer of other part of right lower leg with fat layer exposed L97.822 Non-pressure chronic ulcer of other part of left lower leg with fat layer exposed I25.10 Atherosclerotic heart disease of native coronary artery without angina pectoris Follow-up Appointments Return Appointment in 1 week. Dressing Change Frequency Wound #1 Right,Anterior Lower Leg Do not change entire dressing for one week. Wound #2 Left,Anterior Lower Leg Do not change entire dressing for one week. Skin Barriers/Peri-Wound Care Barrier cream Moisturizing lotion - both legs Wound Cleansing May shower with protection. Primary Wound Dressing Wound #1 Right,Anterior Lower Leg Hydrofera Blue - classic Wound #2 Left,Anterior Lower Leg Hydrofera Blue - classic Secondary  Dressing Wound #1 Right,Anterior Lower Leg Dry Gauze Wound #2 Left,Anterior Lower Leg Dry Gauze Dry Gauze Edema Control Kerlix and Coban - Bilateral Avoid standing for long periods of time Elevate legs to the level of the heart or above for 30 minutes daily and/or when sitting, a frequency of: - throughout the day Exercise regularly Electronic Signature(s) Signed: 11/24/2020 4:34:22 PM By: Worthy Keeler PA-C Signed: 11/25/2020 5:27:06 PM By: Rhae Hammock RN Entered By: Rhae Hammock on 11/24/2020 09:04:54 -------------------------------------------------------------------------------- Problem List Details Patient Name: Date of Service: [name], [name] 11/24/2020 8:30 A M Medical Record Number: [id number] Patient Account Number: [id number] Date of Birth/Sex: Treating RN: 05-22-1951 (69 y.o. [name] Primary Care Provider: [name], MA [name] Other Clinician: Referring Provider: Treating Provider/Extender: Hal Morales, MA [name] Weeks in Treatment: 2 Active Problems ICD-10 Encounter Code Description Active Date MDM Diagnosis E11.622 Type 2 diabetes mellitus with other skin ulcer 11/10/2020 No Yes I89.0 Lymphedema, not elsewhere classified 11/10/2020 No Yes I87.2 Venous insufficiency (chronic) (peripheral) 11/10/2020 No Yes L97.812 Non-pressure chronic ulcer of other part of right lower leg with fat layer 11/10/2020 No Yes exposed L97.822 Non-pressure chronic ulcer of other part of left lower leg with fat layer exposed11/17/2021 No Yes I25.10 Atherosclerotic heart disease of native coronary artery without angina pectoris 11/10/2020 No Yes Inactive Problems Resolved Problems Electronic Signature(s) Signed: 11/24/2020 8:20:55 AM By: Worthy Keeler PA-C Entered By: Worthy Keeler on 11/24/2020 08:20:55 -------------------------------------------------------------------------------- Progress Note Details Patient Name: Date of Service: [name], [name] 11/24/2020 8:30 A M Medical Record Number: [id number] Patient Account Number: [id number] Date of Birth/Sex: Treating RN: Jul 15, 1951 (69 y.o. [name] Primary Care Provider: [name], MA [name] Other Clinician: Referring Provider: Treating Provider/Extender: Darrol Poke  BO NZA, MA [name] Weeks in Treatment: 2 Subjective Chief Complaint Information obtained from Patient Bilateral LE Ulcers History of Present Illness (HPI) 11/10/2020 patient presents today for initial evaluation in this clinic although I have seen this patient in [place] previous. Subsequently his issue today is different from when I saw him for I was treating him for a toe ulcer. Currently he is having issues with bilateral lower extremity ulcers after running into a metal bar. Upon inspection today he has wounds of the bilateral lower extremities which are consistent with having struck his legs on the anterior aspect. With that being said the patient did go to the ER for evaluation on October 11 where he was given Keflex initially. He went back to the ER after not improving on October 20 he was given Lasix at that point he states the Lasix seem to help the most. Has been using Neosporin and leaving this open area which is probably not the best way to go either to be honest. He has had arterial studies on March 2021 which showed a left ABI of 0.78 with a TBI of 0.34 and a right ABI of 1.18 with a TBI 1.18. Nonetheless his arterial flow the left is not ideal but also I think potentially consistent with allowing this to heal. I think he can also support light compression with Kerlix and Coban based on this result. His most recent hemoglobin A1c that we could find was 7.3 and that was towards the end of 2020. The patient does have a history of heart disease unfortunately. He is also currently still a smoker. 11/24/2020 on evaluation today patient appears to be doing well with regard to his wounds. In  fact both appear to be doing better after last week's rather last visit's debridement. Fortunately there is no signs of active infection at this time. No fevers, chills, nausea, vomiting, or diarrhea. Overall I am very pleased with where things stand today. Objective Constitutional Well-nourished and well-hydrated in no acute distress. Vitals Time Taken: 8:30 AM, Height: 71 in, Weight: 225 lbs, BMI: 31.4, Temperature: 98.3 F, Pulse: 76 bpm, Respiratory Rate: 18 breaths/min, Blood Pressure: 143/86 mmHg. Respiratory normal breathing without difficulty. Psychiatric this patient is able to make decisions and demonstrates good insight into disease process. Alert and Oriented x 3. pleasant and cooperative. General Notes: Upon inspection patient's wound bed actually showed signs of good granulation at this time. There does not appear to be any evidence of active infection. No fevers, chills, nausea, vomiting, or diarrhea. Overall I feel like there is a little bit of hyper granulation but otherwise the wound bed seems to be doing quite well Integumentary (Hair, Skin) Wound #1 status is Open. Original cause of wound was Trauma. The wound is located on the Right,Anterior Lower Leg. The wound measures 2.8cm length x 1.8cm width x 0.1cm depth; 3.958cm^2 area and 0.396cm^3 volume. There is Fat Layer (Subcutaneous Tissue) exposed. There is no tunneling or undermining noted. There is a medium amount of serosanguineous drainage noted. The wound margin is flat and intact. There is medium (34-66%) red granulation within the wound bed. There is a medium (34-66%) amount of necrotic tissue within the wound bed including Adherent Slough. Wound #2 status is Open. Original cause of wound was Trauma. The wound is located on the Left,Anterior Lower Leg. The wound measures 2.2cm length x 2.8cm width x 0.1cm depth; 4.838cm^2 area and 0.484cm^3 volume. There is Fat Layer (Subcutaneous Tissue) exposed. There is no  tunneling or undermining  noted. There is a large amount of serosanguineous drainage noted. The wound margin is flat and intact. There is medium (34-66%) red, pink granulation within the wound bed. There is a medium (34-66%) amount of necrotic tissue within the wound bed including Adherent Slough. Assessment Active Problems ICD-10 Type 2 diabetes mellitus with other skin ulcer Lymphedema, not elsewhere classified Venous insufficiency (chronic) (peripheral) Non-pressure chronic ulcer of other part of right lower leg with fat layer exposed Non-pressure chronic ulcer of other part of left lower leg with fat layer exposed Atherosclerotic heart disease of native coronary artery without angina pectoris Plan Follow-up Appointments: Return Appointment in 1 week. Dressing Change Frequency: Wound #1 Right,Anterior Lower Leg: Do not change entire dressing for one week. Wound #2 Left,Anterior Lower Leg: Do not change entire dressing for one week. Skin Barriers/Peri-Wound Care: Moisturizing lotion - both legs Wound Cleansing: May shower with protection. Primary Wound Dressing: Wound #1 Right,Anterior Lower Leg: Hydrofera Blue - classic Wound #2 Left,Anterior Lower Leg: Hydrofera Blue - classic Secondary Dressing: Wound #1 Right,Anterior Lower Leg: Dry Gauze Wound #2 Left,Anterior Lower Leg: Dry Gauze Dry Gauze Edema Control: Kerlix and Coban - Bilateral Avoid standing for long periods of time Elevate legs to the level of the heart or above for 30 minutes daily and/or when sitting, a frequency of: - throughout the day Exercise regularly 1. Would recommend currently that we continue with the compression wrap with Kerlix and Coban that seems to be tolerated by the patient at this time. 2. I am also can recommend at this point that the patient continue with a topical dressing as well the home and discontinue the alginate will actually get a use Hydrofera Blue classic to try to help clean this  up a little bit help with the hyper granular tissue. 3. She continue to elevate his legs as much as possible to try to keep edema under good control. 4. He does see vascular regularly and I am not sure that he needs to go see them sooner at this point especially since he seems to be healing well. I think he may have a regular follow-up in January he tells me we will try to look into that before the next visit. We will see patient back for reevaluation in 1 week here in the clinic. If anything worsens or changes patient will contact our office for additional recommendations. Electronic Signature(s) Signed: 11/24/2020 9:04:00 AM By: Worthy Keeler PA-C Entered By: Worthy Keeler on 11/24/2020 09:03:59 -------------------------------------------------------------------------------- SuperBill Details Patient Name: Date of Service: [name], [name] 11/24/2020 Medical Record Number: [id number] Patient Account Number: [id number] Date of Birth/Sex: Treating RN: 1951/10/05 (69 y.o. [name] Primary Care Provider: [name], MA [name] Other Clinician: Referring Provider: Treating Provider/Extender: Hal Morales, MA [name] Weeks in Treatment: 2 Diagnosis Coding ICD-10 Codes Code Description E11.622 Type 2 diabetes mellitus with other skin ulcer I89.0 Lymphedema, not elsewhere classified I87.2 Venous insufficiency (chronic) (peripheral) L97.812 Non-pressure chronic ulcer of other part of right lower leg with fat layer exposed L97.822 Non-pressure chronic ulcer of other part of left lower leg with fat layer exposed I25.10 Atherosclerotic heart disease of native coronary artery without angina pectoris Facility Procedures CPT4 Code: [id number] Description: 47096 - WOUND CARE VISIT-LEV 5 EST PT Modifier: Quantity: 1 Physician Procedures : CPT4 Code Description Modifier [id number] - WC PHYS LEVEL 3 - EST PT ICD-10 Diagnosis Description E11.622 Type 2 diabetes mellitus with  other skin ulcer  I89.0 Lymphedema, not elsewhere classified I87.2 Venous insufficiency (chronic) (peripheral)  L97.812 Non-pressure chronic ulcer of other part of right lower leg with fat layer exposed Quantity: 1 Electronic Signature(s) Signed: 11/24/2020 4:34:22 PM By: Worthy Keeler PA-C Signed: 11/25/2020 5:27:06 PM By: Rhae Hammock RN Previous Signature: 11/24/2020 9:04:15 AM Version By: Worthy Keeler PA-C Entered By: Rhae Hammock on 11/24/2020 09:06:21

## 2020-11-25 NOTE — Progress Notes (Signed)
RAFFAEL, BUGARIN ([id number]) . Visit Report for 11/24/2020 Arrival Information Details Patient Name: Date of Service: [name]. 11/24/2020 8:30 A M Medical Record Number: [id number] Patient Account Number: [id number] Date of Birth/Sex: Treating RN: May 01, 1951 (69 y.o. [name], [name] Primary Care [name]: [name], MA [name] Other Clinician: Referring Drucella Karbowski: Treating [name]/Extender: Hal Morales, MA [name] Weeks in Treatment: 2 Visit Information History Since Last Visit Added or deleted any medications: No Patient Arrived: Ambulatory Any new allergies or adverse reactions: No Arrival Time: 08:30 Had a fall or experienced change in No Accompanied By: self activities of daily living that may affect Transfer Assistance: None risk of falls: Patient Identification Verified: Yes Signs or symptoms of abuse/neglect since last visito No Secondary Verification Process Completed: Yes Hospitalized since last visit: No Patient Requires Transmission-Based Precautions: No Implantable device outside of the clinic excluding No Patient Has Alerts: Yes cellular tissue based products placed in the center Patient Alerts: Patient on Blood Thinner since last visit: L ABI: 0.78 TBI: 0.34 Has Dressing in Place as Prescribed: Yes R ABI: 1.18 TBI: 1.18 Has Compression in Place as Prescribed: Yes Pain Present Now: No Electronic Signature(s) Signed: 11/24/2020 5:34:45 PM By: [name] Entered By: [name] on 11/24/2020 08:43:54 -------------------------------------------------------------------------------- Clinic Level of Care Assessment Details Patient Name: Date of Service: [name] 11/24/2020 8:30 A M Medical Record Number: [id number] Patient Account Number: [id number] Date of Birth/Sex: Treating RN: [birth year] (69 y.o. [name], [name] Primary Care Bless Lisenby: [name], MA [name] Other Clinician: Referring [name]: Treating [name]/Extender: Hal Morales, MA [name] Weeks in Treatment: 2 Clinic Level of Care Assessment Items TOOL 4 Quantity Score X- 1 0 Use when only an EandM is performed on FOLLOW-UP visit ASSESSMENTS - Nursing Assessment / Reassessment X- 1 10 Reassessment of Co-morbidities (includes updates in patient status) X- 1 5 Reassessment of Adherence to Treatment Plan ASSESSMENTS - Wound and Skin A ssessment / Reassessment []  - 0 Simple Wound Assessment / Reassessment - one wound X- 2 5 Complex Wound Assessment / Reassessment - multiple wounds X- 1 10 Dermatologic / Skin Assessment (not related to wound area) ASSESSMENTS - Focused Assessment X- 2 5 Circumferential Edema Measurements - multi extremities X- 1 10 Nutritional Assessment / Counseling / Intervention X- 1 5 Lower Extremity Assessment (monofilament, tuning fork, pulses) X- 1 10 Peripheral Arterial Disease Assessment (using hand held doppler) ASSESSMENTS - Ostomy and/or Continence Assessment and Care []  - 0 Incontinence Assessment and Management []  - 0 Ostomy Care Assessment and Management (repouching, etc.) PROCESS - Coordination of Care []  - 0 Simple Patient / Family Education for ongoing care X- 1 20 Complex (extensive) Patient / Family Education for ongoing care X- 1 10 Staff obtains Programmer, systems, Records, T Results / Process Orders est []  - 0 Staff telephones HHA, Nursing Homes / Clarify orders / etc []  - 0 Routine Transfer to another Facility (non-emergent condition) []  - 0 Routine Hospital Admission (non-emergent condition) []  - 0 New Admissions / Biomedical engineer / Ordering NPWT Apligraf, etc. , []  - 0 Emergency Hospital Admission (emergent condition) X- 1 10 Simple Discharge Coordination []  - 0 Complex (extensive) Discharge Coordination PROCESS - Special Needs []  - 0 Pediatric / Minor Patient Management []  - 0 Isolation Patient Management []  - 0 Hearing / Language / Visual special needs []  - 0 Assessment  of Community assistance (transportation, D/C planning, etc.) []  - 0  Additional assistance / Altered mentation []  - 0 Support Surface(s) Assessment (bed, cushion, seat, etc.) INTERVENTIONS - Wound Cleansing / Measurement []  - 0 Simple Wound Cleansing - one wound X- 2 5 Complex Wound Cleansing - multiple wounds X- 1 5 Wound Imaging (photographs - any number of wounds) []  - 0 Wound Tracing (instead of photographs) []  - 0 Simple Wound Measurement - one wound X- 2 5 Complex Wound Measurement - multiple wounds INTERVENTIONS - Wound Dressings []  - 0 Small Wound Dressing one or multiple wounds X- 2 15 Medium Wound Dressing one or multiple wounds []  - 0 Large Wound Dressing one or multiple wounds X- 1 5 Application of Medications - topical []  - 0 Application of Medications - injection INTERVENTIONS - Miscellaneous []  - 0 External ear exam []  - 0 Specimen Collection (cultures, biopsies, blood, body fluids, etc.) []  - 0 Specimen(s) / Culture(s) sent or taken to Lab for analysis []  - 0 Patient Transfer (multiple staff / Civil Service fast streamer / Similar devices) []  - 0 Simple Staple / Suture removal (25 or less) []  - 0 Complex Staple / Suture removal (26 or more) []  - 0 Hypo / Hyperglycemic Management (close monitor of Blood Glucose) []  - 0 Ankle / Brachial Index (ABI) - do not check if billed separately X- 1 5 Vital Signs Has the patient been seen at the hospital within the last three years: Yes Total Score: 175 Level Of Care: New/Established - Level 5 Electronic Signature(s) Signed: 11/25/2020 5:27:06 PM By: Rhae Hammock RN Entered By: Rhae Hammock on 11/24/2020 09:05:54 -------------------------------------------------------------------------------- Encounter Discharge Information Details Patient Name: Date of Service: [name], Marcus [name] 11/24/2020 8:30 A M Medical Record Number: [id number] Patient Account Number: [id number] Date of Birth/Sex: Treating RN: 12/31/1950 (69  y.o. [name]) [name] Primary Care Davida Falconi: [name], MA [name] Other Clinician: Referring Treyshaun Keatts: Treating Maxcine Strong/Extender: Hal Morales, MA [name] Weeks in Treatment: 2 Encounter Discharge Information Items Discharge Condition: Stable Ambulatory Status: Ambulatory Discharge Destination: Home Transportation: Private Auto Accompanied By: self Schedule Follow-up Appointment: Yes Clinical Summary of Care: Patient Declined Electronic Signature(s) Signed: 11/24/2020 4:46:53 PM By: Marcus Coria RN Entered By: [name] on 11/24/2020 09:28:21 -------------------------------------------------------------------------------- Lower Extremity Assessment Details Patient Name: Date of Service: [name], Marcus [name] 11/24/2020 8:30 A M Medical Record Number: [id number] Patient Account Number: [id number] Date of Birth/Sex: Treating RN: September 02, 1951 (69 y.o. [name] Primary Care Kaydyn Chism: [name], MA [name] Other Clinician: Referring Orean Giarratano: Treating Mysti Haley/Extender: Hal Morales, MA [name] Weeks in Treatment: 2 Edema Assessment Assessed: [Left: Yes] [Right: Yes] Edema: [Left: No] [Right: No] Calf Left: Right: Point of Measurement: 35 cm From Medial Instep 36.5 cm 36.5 cm Ankle Left: Right: Point of Measurement: 11 cm From Medial Instep 24.5 cm 24 cm Vascular Assessment Pulses: Dorsalis Pedis Palpable: [Left:Yes] [Right:Yes] Electronic Signature(s) Signed: 11/24/2020 5:34:45 PM By: [name] Entered By: [name] on 11/24/2020 08:45:23 -------------------------------------------------------------------------------- Multi-Disciplinary Care Plan Details Patient Name: Date of Service: [name], Marcus [name] 11/24/2020 8:30 A M Medical Record Number: [id number] Patient Account Number: [id number] Date of Birth/Sex: Treating RN: Mar 08, 1951 (69 y.o. Erie Noe Primary Care Ellisha Bankson: [name], MA [name] Other Clinician: Referring  Orla Estrin: Treating Mykeisha Dysert/Extender: Hal Morales, MA [name] Weeks in Treatment: 2 Active Inactive Nutrition Nursing Diagnoses: Impaired glucose control: actual or potential Potential for alteratiion in Nutrition/Potential for imbalanced nutrition Goals: Patient/caregiver will maintain therapeutic  glucose control Date Initiated: 11/10/2020 Target Resolution Date: 12/08/2020 Goal Status: Active Interventions: Assess HgA1c results as ordered upon admission and as needed Provide education on elevated blood sugars and impact on wound healing Treatment Activities: Patient referred to Primary Care Physician for further nutritional evaluation : 11/10/2020 Notes: Venous Leg Ulcer Nursing Diagnoses: Knowledge deficit related to disease process and management Potential for venous Insuffiency (use before diagnosis confirmed) Goals: Patient will maintain optimal edema control Date Initiated: 11/10/2020 Target Resolution Date: 12/08/2020 Goal Status: Active Interventions: Assess peripheral edema status every visit. Compression as ordered Provide education on venous insufficiency Treatment Activities: Therapeutic compression applied : 11/10/2020 Notes: Wound/Skin Impairment Nursing Diagnoses: Impaired tissue integrity Knowledge deficit related to smoking impact on wound healing Knowledge deficit related to ulceration/compromised skin integrity Goals: Patient will demonstrate a reduced rate of smoking or cessation of smoking Date Initiated: 11/10/2020 Target Resolution Date: 12/08/2020 Goal Status: Active Patient/caregiver will verbalize understanding of skin care regimen Date Initiated: 11/10/2020 Target Resolution Date: 12/08/2020 Goal Status: Active Ulcer/skin breakdown will have a volume reduction of 30% by week 4 Date Initiated: 11/10/2020 Target Resolution Date: 12/08/2020 Goal Status: Active Interventions: Assess patient/caregiver ability to obtain necessary  supplies Assess patient/caregiver ability to perform ulcer/skin care regimen upon admission and as needed Assess ulceration(s) every visit Treatment Activities: Skin care regimen initiated : 11/10/2020 Topical wound management initiated : 11/10/2020 Notes: Electronic Signature(s) Signed: 11/25/2020 5:27:06 PM By: Rhae Hammock RN Entered By: Rhae Hammock on 11/24/2020 08:53:32 -------------------------------------------------------------------------------- Pain Assessment Details Patient Name: Date of Service: [name], Marcus [name] 11/24/2020 8:30 A M Medical Record Number: [id number] Patient Account Number: [id number] Date of Birth/Sex: Treating RN: Feb 14, 1951 (69 y.o. [name] Primary Care Enez Monahan: [name], MA [name] Other Clinician: Referring Everlina Gotts: Treating Ruffus Kamaka/Extender: Hal Morales, MA [name] Weeks in Treatment: 2 Active Problems Location of Pain Severity and Description of Pain Patient Has Paino No Site Locations Rate the pain. Current Pain Level: 0 Pain Management and Medication Current Pain Management: Medication: No Cold Application: No Rest: No Massage: No Activity: No SandovalE.N.S.: No Heat Application: No Leg drop or elevation: No Is the Current Pain Management Adequate: Adequate How does your wound impact your activities of daily livingo Sleep: No Bathing: No Appetite: No Relationship With Others: No Bladder Continence: No Emotions: No Bowel Continence: No Work: No Toileting: No Drive: No Dressing: No Hobbies: No Notes Per patient pain at times 10/10 sharp. per patient no pain at this time. Case manager and PA made aware. Electronic Signature(s) Signed: 11/24/2020 5:34:45 PM By: [name] Entered By: [name] on 11/24/2020 08:44:54 -------------------------------------------------------------------------------- Patient/Caregiver Education Details Patient Name: Date of Service: [name], Fonnie Mu  12/1/2021andnbsp8:30 A M Medical Record Number: [id number] Patient Account Number: [id number] Date of Birth/Gender: Treating RN: Dec 25, 1951 (69 y.o. Erie Noe Primary Care Physician: [name], MA [name] Other Clinician: Referring Physician: Treating Physician/Extender: Hal Morales, MA [name] Weeks in Treatment: 2 Education Assessment Education Provided To: Patient Education Topics Provided Elevated Blood Sugar/ Impact on Healing: Methods: Explain/Verbal Responses: Reinforcements needed Electronic Signature(s) Signed: 11/25/2020 5:27:06 PM By: Rhae Hammock RN Entered By: Rhae Hammock on 11/24/2020 09:04:15 -------------------------------------------------------------------------------- Wound Assessment Details Patient Name: Date of Service: [name], Marcus [name] 11/24/2020 8:30 A M Medical Record Number: [id number] Patient Account Number: [id number] Date of Birth/Sex: Treating RN: 30-Aug-1951 (69 y.o. [name] Primary Care Brandie Lopes: [name], MA [place] Other Clinician:  Referring Plato Alspaugh: Treating Birdell Frasier/Extender: Hal Morales, MA [name] Weeks in Treatment: 2 Wound Status Wound Number: 1 Primary Venous Leg Ulcer Etiology: Wound Location: Right, Anterior Lower Leg Wound Open Wounding Event: Trauma Status: Date Acquired: 09/24/2020 Comorbid Cataracts, Arrhythmia, Coronary Artery Disease, Hypertension, Weeks Of Treatment: 2 History: Myocardial Infarction, Peripheral Arterial Disease, Peripheral Clustered Wound: No Venous Disease, Type II Diabetes, Osteoarthritis, Neuropathy, Received Radiation Wound Measurements Length: (cm) 2.8 Width: (cm) 1.8 Depth: (cm) 0.1 Area: (cm) 3.958 Volume: (cm) 0.396 % Reduction in Area: -68% % Reduction in Volume: 15.9% Epithelialization: None Tunneling: No Undermining: No Wound Description Classification: Full Thickness Without Exposed Support Structures Wound Margin: Flat and  Intact Exudate Amount: Medium Exudate Type: Serosanguineous Exudate Color: red, brown Foul Odor After Cleansing: No Slough/Fibrino Yes Wound Bed Granulation Amount: Medium (34-66%) Exposed Structure Granulation Quality: Red Fascia Exposed: No Necrotic Amount: Medium (34-66%) Fat Layer (Subcutaneous Tissue) Exposed: Yes Necrotic Quality: Adherent Slough Tendon Exposed: No Muscle Exposed: No Joint Exposed: No Bone Exposed: No Treatment Notes Wound #1 (Right, Anterior Lower Leg) 1. Cleanse With Wound Cleanser Soap and water 3. Primary Dressing Applied Hydrofera Blue 6. Support Layer Applied Kerlix/Coban Notes moistened with normal saline , netting Electronic Signature(s) Signed: 11/24/2020 5:34:45 PM By: [name] Entered By: [name] on 11/24/2020 08:45:59 -------------------------------------------------------------------------------- Wound Assessment Details Patient Name: Date of Service: Marcus Milliner T. 11/24/2020 8:30 A M Medical Record Number: [id number] Patient Account Number: [id number] Date of Birth/Sex: Treating RN: April 19, 1951 (69 y.o. [name] Primary Care Daleah Coulson: [name], MA [name] Other Clinician: Referring Merly Hinkson: Treating Tomeeka Plaugher/Extender: Hal Morales, MA [name] Weeks in Treatment: 2 Wound Status Wound Number: 2 Primary Venous Leg Ulcer Etiology: Wound Location: Left, Anterior Lower Leg Wound Open Wounding Event: Trauma Status: Date Acquired: 09/24/2020 Comorbid Cataracts, Arrhythmia, Coronary Artery Disease, Hypertension, Weeks Of Treatment: 2 History: Myocardial Infarction, Peripheral Arterial Disease, Peripheral Clustered Wound: No Venous Disease, Type II Diabetes, Osteoarthritis, Neuropathy, Received Radiation Wound Measurements Length: (cm) 2.2 Width: (cm) 2.8 Depth: (cm) 0.1 Area: (cm) 4.838 Volume: (cm) 0.484 % Reduction in Area: -28.3% % Reduction in Volume: 35.8% Epithelialization: Small  (1-33%) Tunneling: No Undermining: No Wound Description Classification: Full Thickness Without Exposed Support Structures Wound Margin: Flat and Intact Exudate Amount: Large Exudate Type: Serosanguineous Exudate Color: red, brown Foul Odor After Cleansing: No Slough/Fibrino Yes Wound Bed Granulation Amount: Medium (34-66%) Exposed Structure Granulation Quality: Red, Pink Fascia Exposed: No Necrotic Amount: Medium (34-66%) Fat Layer (Subcutaneous Tissue) Exposed: Yes Necrotic Quality: Adherent Slough Tendon Exposed: No Muscle Exposed: No Joint Exposed: No Bone Exposed: No Treatment Notes Wound #2 (Left, Anterior Lower Leg) 1. Cleanse With Wound Cleanser Soap and water 3. Primary Dressing Applied Hydrofera Blue 6. Support Layer Applied Kerlix/Coban Notes moistened with normal saline , netting Electronic Signature(s) Signed: 11/24/2020 5:34:45 PM By: [name] Entered By: [name] on 11/24/2020 08:46:47 -------------------------------------------------------------------------------- Vitals Details Patient Name: Date of Service: [name], Marcus [name] 11/24/2020 8:30 A M Medical Record Number: [id number] Patient Account Number: [id number] Date of Birth/Sex: Treating RN: 1951/01/07 (69 y.o. [name], [name] Primary Care Robet Crutchfield: [name], MA [name] Other Clinician: Referring Stephenia Vogan: Treating Sibel Khurana/Extender: Hal Morales, MA [name] Weeks in Treatment: 2 Vital Signs Time Taken: 08:30 Temperature (F): 98.3 Height (in): 71 Pulse (bpm): 76 Weight (lbs): 225 Respiratory Rate (breaths/min): 18 Body Mass Index (BMI): 31.4 Blood Pressure (mmHg): 143/86 Reference Range: 80 - 120  mg / dl Electronic Signature(s) Signed: 11/24/2020 5:34:45 PM By: [name] Entered By: [name] on 11/24/2020 08:44:22

## 2020-12-01 ENCOUNTER — Other Ambulatory Visit: Payer: Self-pay

## 2020-12-01 ENCOUNTER — Encounter (HOSPITAL_BASED_OUTPATIENT_CLINIC_OR_DEPARTMENT_OTHER): Payer: PPO | Admitting: Physician Assistant

## 2020-12-01 DIAGNOSIS — L97812 Non-pressure chronic ulcer of other part of right lower leg with fat layer exposed: Secondary | ICD-10-CM | POA: Diagnosis not present

## 2020-12-01 DIAGNOSIS — E11622 Type 2 diabetes mellitus with other skin ulcer: Secondary | ICD-10-CM | POA: Diagnosis not present

## 2020-12-01 DIAGNOSIS — L97822 Non-pressure chronic ulcer of other part of left lower leg with fat layer exposed: Secondary | ICD-10-CM | POA: Diagnosis not present

## 2020-12-01 NOTE — Progress Notes (Addendum)
ZAKI, GERTSCH ([id number]) . Visit Report for 12/01/2020 Chief Complaint Document Details Patient Name: Date of Service: [name] 12/01/2020 8:15 A M Medical Record Number: [id number] Patient Account Number: [id number] Date of Birth/Sex: Treating RN: 02-Apr-1951 (69 y.o. [name] Primary Care Provider: [name], MA [name] Other Clinician: Referring Provider: Treating Provider/Extender: Hal Morales, MA [name] Weeks in Treatment: 3 Information Obtained from: Patient Chief Complaint Bilateral LE Ulcers Electronic Signature(s) Signed: 12/01/2020 8:27:47 AM By: Worthy Keeler PA-C Entered By: Worthy Keeler on 12/01/2020 08:27:47 -------------------------------------------------------------------------------- HPI Details Patient Name: Date of Service: [name], [name] 12/01/2020 8:15 A M Medical Record Number: [id number] Patient Account Number: [id number] Date of Birth/Sex: Treating RN: 23-Jul-1951 (69 y.o. [name] Primary Care Provider: [name], MA [name] Other Clinician: Referring Provider: Treating Provider/Extender: Hal Morales, MA [name] Weeks in Treatment: 3 History of Present Illness HPI Description: 11/10/2020 patient presents today for initial evaluation in this clinic although I have seen this patient in [place] previous. Subsequently his issue today is different from when I saw him for I was treating him for a toe ulcer. Currently he is having issues with bilateral lower extremity ulcers after running into a metal bar. Upon inspection today he has wounds of the bilateral lower extremities which are consistent with having struck his legs on the anterior aspect. With that being said the patient did go to the ER for evaluation on October 11 where he was given Keflex initially. He went back to the ER after not improving on October 20 he was given Lasix at that point he states the Lasix seem to help the most. Has been  using Neosporin and leaving this open area which is probably not the best way to go either to be honest. He has had arterial studies on March 2021 which showed a left ABI of 0.78 with a TBI of 0.34 and a right ABI of 1.18 with a TBI 1.18. Nonetheless his arterial flow the left is not ideal but also I think potentially consistent with allowing this to heal. I think he can also support light compression with Kerlix and Coban based on this result. His most recent hemoglobin A1c that we could find was 7.3 and that was towards the end of 2020. The patient does have a history of heart disease unfortunately. He is also currently still a smoker. 11/24/2020 on evaluation today patient appears to be doing well with regard to his wounds. In fact both appear to be doing better after last week's rather last visit's debridement. Fortunately there is no signs of active infection at this time. No fevers, chills, nausea, vomiting, or diarrhea. Overall I am very pleased with where things stand today. 12/01/2020 on evaluation today patient appears to be doing well with regard to his leg ulcers. He has been tolerating the dressing changes without complication. Fortunately there is no signs of active infection at this time. No fevers, chills, nausea, vomiting, or diarrhea. Electronic Signature(s) Signed: 12/01/2020 9:13:28 AM By: Worthy Keeler PA-C Entered By: Worthy Keeler on 12/01/2020 09:13:27 -------------------------------------------------------------------------------- Physical Exam Details Patient Name: Date of Service: [name], Fonnie Mu 12/01/2020 8:15 A M Medical Record Number: [id number] Patient Account Number: [id number] Date of Birth/Sex: Treating RN: September 30, 1951 (69 y.o. [name] Primary Care Provider: [name], MA [name] Other Clinician: Referring Provider: Treating Provider/Extender: Yates Decamp  NZA, MA [name] Weeks in Treatment: 3 Constitutional Well-nourished and well-hydrated  in no acute distress. Respiratory normal breathing without difficulty. Psychiatric this patient is able to make decisions and demonstrates good insight into disease process. Alert and Oriented x 3. pleasant and cooperative. Notes Patient's wounds currently are showing signs of excellent granulation and epithelization and very pleased with where things stand and overall I think that he is making excellent progress. Electronic Signature(s) Signed: 12/01/2020 9:14:25 AM By: Worthy Keeler PA-C Entered By: Worthy Keeler on 12/01/2020 09:14:25 -------------------------------------------------------------------------------- Physician Orders Details Patient Name: Date of Service: [name], [name] 12/01/2020 8:15 A M Medical Record Number: [id number] Patient Account Number: [id number] Date of Birth/Sex: Treating RN: 10/09/1951 (69 y.o. [name] Primary Care Provider: [name], MA [name] Other Clinician: Referring Provider: Treating Provider/Extender: Hal Morales, MA [name] Weeks in Treatment: 3 Verbal / Phone Orders: No Diagnosis Coding ICD-10 Coding Code Description E11.622 Type 2 diabetes mellitus with other skin ulcer I89.0 Lymphedema, not elsewhere classified I87.2 Venous insufficiency (chronic) (peripheral) L97.812 Non-pressure chronic ulcer of other part of right lower leg with fat layer exposed L97.822 Non-pressure chronic ulcer of other part of left lower leg with fat layer exposed I25.10 Atherosclerotic heart disease of native coronary artery without angina pectoris Follow-up Appointments Return Appointment in 1 week. Bathing/ Shower/ Hygiene May shower with protection but do not get wound dressing(s) wet. Edema Control - Lymphedema / SCD / Other Bilateral Lower Extremities Elevate legs to the level of the heart or above for 30 minutes daily and/or when sitting, a frequency of: Avoid standing for long periods of time. Exercise regularly Wound  Treatment Wound #1 - Lower Leg Wound Laterality: Right, Anterior Peri-Wound Care: Sween Lotion (Moisturizing lotion) 1 x Per Week Discharge Instructions: Apply moisturizing lotion as directed Prim Dressing: Hydrofera Blue Classic Foam, 2x2 in 1 x Per Week ary Discharge Instructions: Moisten with saline prior to applying to wound bed Secondary Dressing: Woven Gauze Sponge, Non-Sterile 4x4 in 1 x Per Week Discharge Instructions: Apply over primary dressing as directed. Compression Wrap: Kerlix Roll 4.5x3.1 (in/yd) 1 x Per Week Discharge Instructions: Apply Kerlix and Coban compression as directed. Compression Wrap: Coban Self-Adherent Wrap 4x5 (in/yd) 1 x Per Week Discharge Instructions: Apply over Kerlix as directed. Wound #2 - Lower Leg Wound Laterality: Left, Anterior Peri-Wound Care: Sween Lotion (Moisturizing lotion) 1 x Per Week Discharge Instructions: Apply moisturizing lotion as directed Prim Dressing: Hydrofera Blue Classic Foam, 2x2 in 1 x Per Week ary Discharge Instructions: Moisten with saline prior to applying to wound bed Secondary Dressing: Woven Gauze Sponge, Non-Sterile 4x4 in 1 x Per Week Discharge Instructions: Apply over primary dressing as directed. Compression Wrap: Kerlix Roll 4.5x3.1 (in/yd) 1 x Per Week Discharge Instructions: Apply Kerlix and Coban compression as directed. Compression Wrap: Coban Self-Adherent Wrap 4x5 (in/yd) 1 x Per Week Discharge Instructions: Apply over Kerlix as directed. Electronic Signature(s) Signed: 12/01/2020 4:01:55 PM By: Worthy Keeler PA-C Signed: 12/01/2020 6:12:31 PM By: Baruch Gouty RN, BSN Entered By: Baruch Gouty on 12/01/2020 09:12:20 -------------------------------------------------------------------------------- Problem List Details Patient Name: Date of Service: [name], [name] 12/01/2020 8:15 A M Medical Record Number: [id number] Patient Account Number: [id number] Date of Birth/Sex: Treating RN: 1950-12-31  (69 y.o. [name] Primary Care Provider: [name], MA [name] Other Clinician: Referring Provider: Treating Provider/Extender: Hal Morales, MA [name] Weeks in Treatment: 3 Active Problems ICD-10  Encounter Code Description Active Date MDM Diagnosis E11.622 Type 2 diabetes mellitus with other skin ulcer 11/10/2020 No Yes I89.0 Lymphedema, not elsewhere classified 11/10/2020 No Yes I87.2 Venous insufficiency (chronic) (peripheral) 11/10/2020 No Yes L97.812 Non-pressure chronic ulcer of other part of right lower leg with fat layer 11/10/2020 No Yes exposed L97.822 Non-pressure chronic ulcer of other part of left lower leg with fat layer exposed11/17/2021 No Yes I25.10 Atherosclerotic heart disease of native coronary artery without angina pectoris 11/10/2020 No Yes Inactive Problems Resolved Problems Electronic Signature(s) Signed: 12/01/2020 8:27:42 AM By: Worthy Keeler PA-C Entered By: Worthy Keeler on 12/01/2020 08:27:42 -------------------------------------------------------------------------------- Progress Note Details Patient Name: Date of Service: [name], [name] 12/01/2020 8:15 A M Medical Record Number: [id number] Patient Account Number: [id number] Date of Birth/Sex: Treating RN: Mar 26, 1951 (69 y.o. [name] Primary Care Provider: [name], MA [name] Other Clinician: Referring Provider: Treating Provider/Extender: Hal Morales, MA [name] Weeks in Treatment: 3 Subjective Chief Complaint Information obtained from Patient Bilateral LE Ulcers History of Present Illness (HPI) 11/10/2020 patient presents today for initial evaluation in this clinic although I have seen this patient in [place] previous. Subsequently his issue today is different from when I saw him for I was treating him for a toe ulcer. Currently he is having issues with bilateral lower extremity ulcers after running into a metal bar. Upon inspection today he has  wounds of the bilateral lower extremities which are consistent with having struck his legs on the anterior aspect. With that being said the patient did go to the ER for evaluation on October 11 where he was given Keflex initially. He went back to the ER after not improving on October 20 he was given Lasix at that point he states the Lasix seem to help the most. Has been using Neosporin and leaving this open area which is probably not the best way to go either to be honest. He has had arterial studies on March 2021 which showed a left ABI of 0.78 with a TBI of 0.34 and a right ABI of 1.18 with a TBI 1.18. Nonetheless his arterial flow the left is not ideal but also I think potentially consistent with allowing this to heal. I think he can also support light compression with Kerlix and Coban based on this result. His most recent hemoglobin A1c that we could find was 7.3 and that was towards the end of 2020. The patient does have a history of heart disease unfortunately. He is also currently still a smoker. 11/24/2020 on evaluation today patient appears to be doing well with regard to his wounds. In fact both appear to be doing better after last week's rather last visit's debridement. Fortunately there is no signs of active infection at this time. No fevers, chills, nausea, vomiting, or diarrhea. Overall I am very pleased with where things stand today. 12/01/2020 on evaluation today patient appears to be doing well with regard to his leg ulcers. He has been tolerating the dressing changes without complication. Fortunately there is no signs of active infection at this time. No fevers, chills, nausea, vomiting, or diarrhea. Objective Constitutional Well-nourished and well-hydrated in no acute distress. Vitals Time Taken: 8:08 AM, Height: 71 in, Weight: 225 lbs, BMI: 31.4, Temperature: 97.7 F, Pulse: 82 bpm, Respiratory Rate: 18 breaths/min, Blood Pressure: 138/78 mmHg. Respiratory normal breathing  without difficulty. Psychiatric this patient is able to make decisions and demonstrates good insight into disease process. Alert and Oriented  x 3. pleasant and cooperative. General Notes: Patient's wounds currently are showing signs of excellent granulation and epithelization and very pleased with where things stand and overall I think that he is making excellent progress. Integumentary (Hair, Skin) Wound #1 status is Open. Original cause of wound was Trauma. The wound is located on the Right,Anterior Lower Leg. The wound measures 2.5cm length x 1.5cm width x 0.1cm depth; 2.945cm^2 area and 0.295cm^3 volume. There is Fat Layer (Subcutaneous Tissue) exposed. There is no tunneling or undermining noted. There is a medium amount of serosanguineous drainage noted. The wound margin is flat and intact. There is large (67-100%) red granulation within the wound bed. There is a small (1-33%) amount of necrotic tissue within the wound bed including Adherent Slough. Wound #2 status is Open. Original cause of wound was Trauma. The wound is located on the Left,Anterior Lower Leg. The wound measures 2.4cm length x 2.3cm width x 0.1cm depth; 4.335cm^2 area and 0.434cm^3 volume. There is Fat Layer (Subcutaneous Tissue) exposed. There is no tunneling or undermining noted. There is a medium amount of serosanguineous drainage noted. The wound margin is flat and intact. There is medium (34-66%) red granulation within the wound bed. There is a medium (34-66%) amount of necrotic tissue within the wound bed including Adherent Slough. Assessment Active Problems ICD-10 Type 2 diabetes mellitus with other skin ulcer Lymphedema, not elsewhere classified Venous insufficiency (chronic) (peripheral) Non-pressure chronic ulcer of other part of right lower leg with fat layer exposed Non-pressure chronic ulcer of other part of left lower leg with fat layer exposed Atherosclerotic heart disease of native coronary artery without  angina pectoris Plan Follow-up Appointments: Return Appointment in 1 week. Bathing/ Shower/ Hygiene: May shower with protection but do not get wound dressing(s) wet. Edema Control - Lymphedema / SCD / Other: Elevate legs to the level of the heart or above for 30 minutes daily and/or when sitting, a frequency of: Avoid standing for long periods of time. Exercise regularly WOUND #1: - Lower Leg Wound Laterality: Right, Anterior Peri-Wound Care: Sween Lotion (Moisturizing lotion) 1 x Per Week/ Discharge Instructions: Apply moisturizing lotion as directed Prim Dressing: Hydrofera Blue Classic Foam, 2x2 in 1 x Per Week/ ary Discharge Instructions: Moisten with saline prior to applying to wound bed Secondary Dressing: Woven Gauze Sponge, Non-Sterile 4x4 in 1 x Per Week/ Discharge Instructions: Apply over primary dressing as directed. Com pression Wrap: Kerlix Roll 4.5x3.1 (in/yd) 1 x Per Week/ Discharge Instructions: Apply Kerlix and Coban compression as directed. Com pression Wrap: Coban Self-Adherent Wrap 4x5 (in/yd) 1 x Per Week/ Discharge Instructions: Apply over Kerlix as directed. WOUND #2: - Lower Leg Wound Laterality: Left, Anterior Peri-Wound Care: Sween Lotion (Moisturizing lotion) 1 x Per Week/ Discharge Instructions: Apply moisturizing lotion as directed Prim Dressing: Hydrofera Blue Classic Foam, 2x2 in 1 x Per Week/ ary Discharge Instructions: Moisten with saline prior to applying to wound bed Secondary Dressing: Woven Gauze Sponge, Non-Sterile 4x4 in 1 x Per Week/ Discharge Instructions: Apply over primary dressing as directed. Com pression Wrap: Kerlix Roll 4.5x3.1 (in/yd) 1 x Per Week/ Discharge Instructions: Apply Kerlix and Coban compression as directed. Com pression Wrap: Coban Self-Adherent Wrap 4x5 (in/yd) 1 x Per Week/ Discharge Instructions: Apply over Kerlix as directed. 1. Would recommend currently that we going to continue with wound care measures as before the  patient is in agreement with the plan. This includes the use of Hydrofera Blue which I do believe has been beneficial for him. 2. I  am also can recommend that the patient continue with the Curlex and Coban wrap which has seemed to be of benefit for him. We will see patient back for reevaluation in 1 week here in the clinic. If anything worsens or changes patient will contact our office for additional recommendations. Electronic Signature(s) Signed: 12/01/2020 9:14:59 AM By: Worthy Keeler PA-C Entered By: Worthy Keeler on 12/01/2020 09:14:58 -------------------------------------------------------------------------------- SuperBill Details Patient Name: Date of Service: [name], [name] 12/01/2020 Medical Record Number: [id number] Patient Account Number: [id number] Date of Birth/Sex: Treating RN: 01-06-1951 (69 y.o. [name] Primary Care Provider: [name], MA [name] Other Clinician: Referring Provider: Treating Provider/Extender: Hal Morales, MA [name] Weeks in Treatment: 3 Diagnosis Coding ICD-10 Codes Code Description E11.622 Type 2 diabetes mellitus with other skin ulcer I89.0 Lymphedema, not elsewhere classified I87.2 Venous insufficiency (chronic) (peripheral) L97.812 Non-pressure chronic ulcer of other part of right lower leg with fat layer exposed L97.822 Non-pressure chronic ulcer of other part of left lower leg with fat layer exposed I25.10 Atherosclerotic heart disease of native coronary artery without angina pectoris Facility Procedures CPT4 Code: [id number] Description: 99214 - WOUND CARE VISIT-LEV 4 EST PT Modifier: Quantity: 1 Physician Procedures : CPT4 Code Description Modifier [id number] - WC PHYS LEVEL 3 - EST PT ICD-10 Diagnosis Description E11.622 Type 2 diabetes mellitus with other skin ulcer I89.0 Lymphedema, not elsewhere classified I87.2 Venous insufficiency (chronic) (peripheral)  L97.812 Non-pressure chronic ulcer of other part  of right lower leg with fat layer exposed Quantity: 1 Electronic Signature(s) Signed: 12/01/2020 9:16:11 AM By: Worthy Keeler PA-C Entered By: Worthy Keeler on 12/01/2020 09:16:10

## 2020-12-02 NOTE — Progress Notes (Signed)
[name] ([id number]) . Visit Report for 12/01/2020 Arrival Information Details Patient Name: Date of Service: [name]. 12/01/2020 8:15 A M Medical Record Number: [id number] Patient Account Number: [id number] Date of Birth/Sex: Treating RN: 10/07/1951 (69 y.o. [name] Primary Care [name]: Marcus Mallick, MA [name] Other Clinician: Referring [name]: Treating [name]/Extender: Hal Morales, MA [name] Weeks in Treatment: 3 Visit Information History Since Last Visit Added or deleted any medications: No Patient Arrived: Ambulatory Any new allergies or adverse reactions: No Arrival Time: 08:06 Had a fall or experienced change in No Accompanied By: alone activities of daily living that may affect Transfer Assistance: None risk of falls: Patient Identification Verified: Yes Signs or symptoms of abuse/neglect since last visito No Secondary Verification Process Completed: Yes Hospitalized since last visit: No Patient Requires Transmission-Based Precautions: No Implantable device outside of the clinic excluding No Patient Has Alerts: Yes cellular tissue based products placed in the center Patient Alerts: Patient on Blood Thinner since last visit: L ABI: 0.78 TBI: 0.34 Has Dressing in Place as Prescribed: Yes R ABI: 1.18 TBI: 1.18 Has Compression in Place as Prescribed: Yes Pain Present Now: No Electronic Signature(s) Signed: 12/02/2020 4:05:55 PM By: Levan Hurst RN, BSN Entered By: Levan Hurst on 12/01/2020 08:08:32 -------------------------------------------------------------------------------- Clinic Level of Care Assessment Details Patient Name: Date of Service: [name], Marcus [name] 12/01/2020 8:15 A M Medical Record Number: [id number] Patient Account Number: [id number] Date of Birth/Sex: Treating RN: 1951/04/14 (69 y.o. [name] Primary Care [name]: Marcus Mallick, MA [name] Other Clinician: Referring [name]: Treating  Kendel Pesnell/Extender: Hal Morales, MA [name] Weeks in Treatment: 3 Clinic Level of Care Assessment Items TOOL 4 Quantity Score []  - 0 Use when only an EandM is performed on FOLLOW-UP visit ASSESSMENTS - Nursing Assessment / Reassessment X- 1 10 Reassessment of Co-morbidities (includes updates in patient status) X- 1 5 Reassessment of Adherence to Treatment Plan ASSESSMENTS - Wound and Skin A ssessment / Reassessment []  - 0 Simple Wound Assessment / Reassessment - one wound X- 2 5 Complex Wound Assessment / Reassessment - multiple wounds []  - 0 Dermatologic / Skin Assessment (not related to wound area) ASSESSMENTS - Focused Assessment X- 2 5 Circumferential Edema Measurements - multi extremities []  - 0 Nutritional Assessment / Counseling / Intervention X- 1 5 Lower Extremity Assessment (monofilament, tuning fork, pulses) []  - 0 Peripheral Arterial Disease Assessment (using hand held doppler) ASSESSMENTS - Ostomy and/or Continence Assessment and Care []  - 0 Incontinence Assessment and Management []  - 0 Ostomy Care Assessment and Management (repouching, etc.) PROCESS - Coordination of Care X - Simple Patient / Family Education for ongoing care 1 15 []  - 0 Complex (extensive) Patient / Family Education for ongoing care X- 1 10 Staff obtains Programmer, systems, Records, T Results / Process Orders est []  - 0 Staff telephones HHA, Nursing Homes / Clarify orders / etc []  - 0 Routine Transfer to another Facility (non-emergent condition) []  - 0 Routine Hospital Admission (non-emergent condition) []  - 0 New Admissions / Biomedical engineer / Ordering NPWT Apligraf, etc. , []  - 0 Emergency Hospital Admission (emergent condition) X- 1 10 Simple Discharge Coordination []  - 0 Complex (extensive) Discharge Coordination PROCESS - Special Needs []  - 0 Pediatric / Minor Patient Management []  - 0 Isolation Patient Management []  - 0 Hearing / Language / Visual special  needs []  - 0 Assessment of Community assistance (transportation, D/C planning, etc.) []  -  0 Additional assistance / Altered mentation []  - 0 Support Surface(s) Assessment (bed, cushion, seat, etc.) INTERVENTIONS - Wound Cleansing / Measurement []  - 0 Simple Wound Cleansing - one wound X- 2 5 Complex Wound Cleansing - multiple wounds X- 1 5 Wound Imaging (photographs - any number of wounds) []  - 0 Wound Tracing (instead of photographs) []  - 0 Simple Wound Measurement - one wound X- 2 5 Complex Wound Measurement - multiple wounds INTERVENTIONS - Wound Dressings []  - 0 Small Wound Dressing one or multiple wounds X- 2 15 Medium Wound Dressing one or multiple wounds []  - 0 Large Wound Dressing one or multiple wounds X- 1 5 Application of Medications - topical []  - 0 Application of Medications - injection INTERVENTIONS - Miscellaneous []  - 0 External ear exam []  - 0 Specimen Collection (cultures, biopsies, blood, body fluids, etc.) []  - 0 Specimen(s) / Culture(s) sent or taken to Lab for analysis []  - 0 Patient Transfer (multiple staff / Civil Service fast streamer / Similar devices) []  - 0 Simple Staple / Suture removal (25 or less) []  - 0 Complex Staple / Suture removal (26 or more) []  - 0 Hypo / Hyperglycemic Management (close monitor of Blood Glucose) []  - 0 Ankle / Brachial Index (ABI) - do not check if billed separately X- 1 5 Vital Signs Has the patient been seen at the hospital within the last three years: Yes Total Score: 140 Level Of Care: New/Established - Level 4 Electronic Signature(s) Signed: 12/01/2020 6:12:31 PM By: Baruch Gouty RN, BSN Entered By: Baruch Gouty on 12/01/2020 09:14:03 -------------------------------------------------------------------------------- Encounter Discharge Information Details Patient Name: Date of Service: [name], Marcus [name] 12/01/2020 8:15 A M Medical Record Number: [id number] Patient Account Number: [id number] Date of Birth/Sex:  Treating RN: 02-14-1951 (69 y.o. [name]) [name] Primary Care Skylynne Schlechter: Marcus Mallick, MA [name] Other Clinician: Referring Trentyn Boisclair: Treating Inice Sanluis/Extender: Hal Morales, MA [name] Weeks in Treatment: 3 Encounter Discharge Information Items Discharge Condition: Stable Ambulatory Status: Ambulatory Discharge Destination: Home Transportation: Private Auto Accompanied By: self Schedule Follow-up Appointment: Yes Clinical Summary of Care: Patient Declined Electronic Signature(s) Signed: 12/01/2020 5:41:55 PM By: [name] RN Entered By: [name] on 12/01/2020 09:39:21 -------------------------------------------------------------------------------- Lower Extremity Assessment Details Patient Name: Date of Service: [name] 12/01/2020 8:15 A M Medical Record Number: [id number] Patient Account Number: [id number] Date of Birth/Sex: Treating RN: 1951/06/23 (69 y.o. [name] Primary Care Jadesola Poynter: Marcus Mallick, MA [name] Other Clinician: Referring Thurley Francesconi: Treating Kamdyn Colborn/Extender: Hal Morales, MA [name] Weeks in Treatment: 3 Edema Assessment Assessed: [Left: No] [Right: No] Edema: [Left: No] [Right: No] Calf Left: Right: Point of Measurement: 35 cm From Medial Instep 35 cm 38 cm Ankle Left: Right: Point of Measurement: 11 cm From Medial Instep 23 cm 23.5 cm Vascular Assessment Pulses: Dorsalis Pedis Palpable: [Left:Yes] [Right:Yes] Electronic Signature(s) Signed: 12/02/2020 4:05:55 PM By: Levan Hurst RN, BSN Entered By: Levan Hurst on 12/01/2020 08:18:01 -------------------------------------------------------------------------------- Multi-Disciplinary Care Plan Details Patient Name: Date of Service: [name], Marcus [name] 12/01/2020 8:15 A M Medical Record Number: [id number] Patient Account Number: [id number] Date of Birth/Sex: Treating RN: 1951/11/06 (69 y.o. [name] Primary Care Kashonda Sarkisyan: Marcus Mallick, MA [name] Other  Clinician: Referring Leylanie Woodmansee: Treating Evolette Pendell/Extender: Hal Morales, MA [name] Weeks in Treatment: 3 Active Inactive Nutrition Nursing Diagnoses: Impaired glucose control: actual or potential Potential for alteratiion in Nutrition/Potential for imbalanced nutrition Goals:  Patient/caregiver will maintain therapeutic glucose control Date Initiated: 11/10/2020 Target Resolution Date: 12/08/2020 Goal Status: Active Interventions: Assess HgA1c results as ordered upon admission and as needed Provide education on elevated blood sugars and impact on wound healing Treatment Activities: Patient referred to Primary Care Physician for further nutritional evaluation : 11/10/2020 Notes: Venous Leg Ulcer Nursing Diagnoses: Knowledge deficit related to disease process and management Potential for venous Insuffiency (use before diagnosis confirmed) Goals: Patient will maintain optimal edema control Date Initiated: 11/10/2020 Target Resolution Date: 12/08/2020 Goal Status: Active Interventions: Assess peripheral edema status every visit. Compression as ordered Provide education on venous insufficiency Treatment Activities: Therapeutic compression applied : 11/10/2020 Notes: Wound/Skin Impairment Nursing Diagnoses: Impaired tissue integrity Knowledge deficit related to smoking impact on wound healing Knowledge deficit related to ulceration/compromised skin integrity Goals: Patient will demonstrate a reduced rate of smoking or cessation of smoking Date Initiated: 11/10/2020 Target Resolution Date: 12/08/2020 Goal Status: Active Patient/caregiver will verbalize understanding of skin care regimen Date Initiated: 11/10/2020 Target Resolution Date: 12/08/2020 Goal Status: Active Ulcer/skin breakdown will have a volume reduction of 30% by week 4 Date Initiated: 11/10/2020 Target Resolution Date: 12/08/2020 Goal Status: Active Interventions: Assess patient/caregiver  ability to obtain necessary supplies Assess patient/caregiver ability to perform ulcer/skin care regimen upon admission and as needed Assess ulceration(s) every visit Treatment Activities: Skin care regimen initiated : 11/10/2020 Topical wound management initiated : 11/10/2020 Notes: Electronic Signature(s) Signed: 12/01/2020 6:12:31 PM By: Baruch Gouty RN, BSN Entered By: Baruch Gouty on 12/01/2020 08:10:19 -------------------------------------------------------------------------------- Pain Assessment Details Patient Name: Date of Service: [name], Marcus [name] 12/01/2020 8:15 A M Medical Record Number: [id number] Patient Account Number: [id number] Date of Birth/Sex: Treating RN: [birth year] (69 y.o. [name] Primary Care Milea Klink: Marcus Mallick, MA [name] Other Clinician: Referring Miller Edgington: Treating Akaila Rambo/Extender: Hal Morales, MA [name] Weeks in Treatment: 3 Active Problems Location of Pain Severity and Description of Pain Patient Has Paino No Site Locations Pain Management and Medication Current Pain Management: Electronic Signature(s) Signed: 12/02/2020 4:05:55 PM By: Levan Hurst RN, BSN Entered By: Levan Hurst on 12/01/2020 08:08:55 -------------------------------------------------------------------------------- Patient/Caregiver Education Details Patient Name: Date of Service: [name] 12/8/2021andnbsp8:15 A M Medical Record Number: [id number] Patient Account Number: [id number] Date of Birth/Gender: Treating RN: 10-08-1951 (69 y.o. [name] Primary Care Physician: Marcus Mallick, MA [name] Other Clinician: Referring Physician: Treating Physician/Extender: Hal Morales, MA [name] Weeks in Treatment: 3 Education Assessment Education Provided To: Patient Education Topics Provided Elevated Blood Sugar/ Impact on Healing: Methods: Explain/Verbal Responses: Reinforcements needed, State content  correctly Venous: Methods: Explain/Verbal Responses: Reinforcements needed, State content correctly Electronic Signature(s) Signed: 12/01/2020 6:12:31 PM By: Baruch Gouty RN, BSN Entered By: Baruch Gouty on 12/01/2020 08:10:41 -------------------------------------------------------------------------------- Wound Assessment Details Patient Name: Date of Service: [name], Marcus [name] 12/01/2020 8:15 A M Medical Record Number: [id number] Patient Account Number: [id number] Date of Birth/Sex: Treating RN: 1951-05-30 (69 y.o. [name] Primary Care Collie Kittel: Marcus Mallick, MA [name] Other Clinician: Referring Elmar Antigua: Treating Oluwadamilola Deliz/Extender: Hal Morales, MA [name] Weeks in Treatment: 3 Wound Status Wound Number: 1 Primary Venous Leg Ulcer Etiology: Wound Location: Right, Anterior Lower Leg Wound Open Wounding Event: Trauma Status: Date Acquired: 09/24/2020 Comorbid Cataracts, Arrhythmia, Coronary Artery Disease, Hypertension, Weeks Of Treatment: 3 History: Myocardial Infarction, Peripheral Arterial Disease, Peripheral Clustered Wound: No Venous Disease, Type II Diabetes, Osteoarthritis, Neuropathy, Received Radiation Wound Measurements Length: (  cm) 2.5 Width: (cm) 1.5 Depth: (cm) 0.1 Area: (cm) 2.945 Volume: (cm) 0.295 % Reduction in Area: -25% % Reduction in Volume: 37.4% Epithelialization: Small (1-33%) Tunneling: No Undermining: No Wound Description Classification: Full Thickness Without Exposed Support Structures Wound Margin: Flat and Intact Exudate Amount: Medium Exudate Type: Serosanguineous Exudate Color: red, brown Foul Odor After Cleansing: No Slough/Fibrino Yes Wound Bed Granulation Amount: Large (67-100%) Exposed Structure Granulation Quality: Red Fascia Exposed: No Necrotic Amount: Small (1-33%) Fat Layer (Subcutaneous Tissue) Exposed: Yes Necrotic Quality: Adherent Slough Tendon Exposed: No Muscle Exposed: No Joint Exposed:  No Bone Exposed: No Treatment Notes Wound #1 (Lower Leg) Wound Laterality: Right, Anterior Cleanser Soap and Water Discharge Instruction: May shower and wash wound with dial antibacterial soap and water prior to dressing change. Wound Cleanser Discharge Instruction: Cleanse the wound with wound cleanser prior to applying a clean dressing using gauze sponges, not tissue or cotton balls. Peri-Wound Care Topical Primary Dressing Hydrofera Blue Classic Foam, 2x2 in Discharge Instruction: Moisten with saline prior to applying to wound bed Secondary Dressing Woven Gauze Sponge, Non-Sterile 4x4 in Discharge Instruction: Apply over primary dressing as directed. Secured With Compression Wrap Kerlix Roll 4.5x3.1 (in/yd) Discharge Instruction: Apply Kerlix and Coban compression as directed. Coban Self-Adherent Wrap 4x5 (in/yd) Discharge Instruction: Apply over Kerlix as directed. Compression Stockings Add-Ons Electronic Signature(s) Signed: 12/02/2020 4:05:55 PM By: Levan Hurst RN, BSN Entered By: Levan Hurst on 12/01/2020 08:21:26 -------------------------------------------------------------------------------- Wound Assessment Details Patient Name: Date of Service: [name], Marcus [name] 12/01/2020 8:15 A M Medical Record Number: [id number] Patient Account Number: [id number] Date of Birth/Sex: Treating RN: September 26, 1951 (69 y.o. [name] Primary Care Shamiah Kahler: Marcus Mallick, MA [name] Other Clinician: Referring Keric Zehren: Treating Laityn Bensen/Extender: Hal Morales, MA [name] Weeks in Treatment: 3 Wound Status Wound Number: 2 Primary Venous Leg Ulcer Etiology: Wound Location: Left, Anterior Lower Leg Wound Open Wounding Event: Trauma Status: Date Acquired: 09/24/2020 Date Acquired: 09/24/2020 Comorbid Cataracts, Arrhythmia, Coronary Artery Disease, Hypertension, Weeks Of Treatment: 3 History: Myocardial Infarction, Peripheral Arterial Disease, Peripheral Clustered  Wound: No Venous Disease, Type II Diabetes, Osteoarthritis, Neuropathy, Received Radiation Wound Measurements Length: (cm) 2.4 Width: (cm) 2.3 Depth: (cm) 0.1 Area: (cm) 4.335 Volume: (cm) 0.434 % Reduction in Area: -15% % Reduction in Volume: 42.4% Epithelialization: Small (1-33%) Tunneling: No Undermining: No Wound Description Classification: Full Thickness Without Exposed Support Structures Wound Margin: Flat and Intact Exudate Amount: Medium Exudate Type: Serosanguineous Exudate Color: red, brown Foul Odor After Cleansing: No Slough/Fibrino Yes Wound Bed Granulation Amount: Medium (34-66%) Exposed Structure Granulation Quality: Red Fascia Exposed: No Necrotic Amount: Medium (34-66%) Fat Layer (Subcutaneous Tissue) Exposed: Yes Necrotic Quality: Adherent Slough Tendon Exposed: No Muscle Exposed: No Joint Exposed: No Bone Exposed: No Treatment Notes Wound #2 (Lower Leg) Wound Laterality: Left, Anterior Cleanser Soap and Water Discharge Instruction: May shower and wash wound with dial antibacterial soap and water prior to dressing change. Wound Cleanser Discharge Instruction: Cleanse the wound with wound cleanser prior to applying a clean dressing using gauze sponges, not tissue or cotton balls. Peri-Wound Care Topical Primary Dressing Hydrofera Blue Classic Foam, 2x2 in Discharge Instruction: Moisten with saline prior to applying to wound bed Secondary Dressing Woven Gauze Sponge, Non-Sterile 4x4 in Discharge Instruction: Apply over primary dressing as directed. Secured With Compression Wrap Kerlix Roll 4.5x3.1 (in/yd) Discharge Instruction: Apply Kerlix and Coban compression as directed. Coban Self-Adherent Wrap 4x5 (in/yd) Discharge Instruction: Apply over Kerlix as directed. Compression Stockings Add-Ons Electronic  Signature(s) Signed: 12/02/2020 4:05:55 PM By: Levan Hurst RN, BSN Entered By: Levan Hurst on 12/01/2020  08:21:37 -------------------------------------------------------------------------------- [place] Details Patient Name: Date of Service: [name], Marcus [name] 12/01/2020 8:15 A M Medical Record Number: [id number] Patient Account Number: [id number] Date of Birth/Sex: Treating RN: [birth year] (69 y.o. [name] Primary Care Johan Creveling: Marcus Mallick, MA [name] Other Clinician: Referring Laurabelle Gorczyca: Treating Ceceilia Cephus/Extender: Hal Morales, MA [name] Weeks in Treatment: 3 Vital Signs Time Taken: 08:08 Temperature (F): 97.7 Height (in): 71 Pulse (bpm): 82 Weight (lbs): 225 Respiratory Rate (breaths/min): 18 Body Mass Index (BMI): 31.4 Blood Pressure (mmHg): 138/78 Reference Range: 80 - 120 mg / dl Electronic Signature(s) Signed: 12/02/2020 4:05:55 PM By: Levan Hurst RN, BSN Entered By: Levan Hurst on 12/01/2020 28:97:91

## 2020-12-07 ENCOUNTER — Telehealth: Payer: Self-pay | Admitting: Cardiovascular Disease

## 2020-12-07 NOTE — Telephone Encounter (Signed)
     Patient calling the office for samples of medication:   1.  What medication and dosage are you requesting samples for? Eliquis   2.  Are you currently out of this medication? Yes   Pt would like to know if here's available samples he can get

## 2020-12-07 NOTE — Telephone Encounter (Signed)
Medication samples have been provided to the patient.  Drug name: Eliquis  Qty: 3 boxes  LOT: [id number]  Exp.Date: 07/2022  Samples left at front desk for patient pick-up. Patient notified.

## 2020-12-08 ENCOUNTER — Other Ambulatory Visit: Payer: Self-pay

## 2020-12-08 ENCOUNTER — Encounter (HOSPITAL_BASED_OUTPATIENT_CLINIC_OR_DEPARTMENT_OTHER): Payer: PPO | Admitting: Physician Assistant

## 2020-12-08 DIAGNOSIS — E11622 Type 2 diabetes mellitus with other skin ulcer: Secondary | ICD-10-CM | POA: Diagnosis not present

## 2020-12-08 DIAGNOSIS — L97822 Non-pressure chronic ulcer of other part of left lower leg with fat layer exposed: Secondary | ICD-10-CM | POA: Diagnosis not present

## 2020-12-08 DIAGNOSIS — L97812 Non-pressure chronic ulcer of other part of right lower leg with fat layer exposed: Secondary | ICD-10-CM | POA: Diagnosis not present

## 2020-12-08 NOTE — Progress Notes (Addendum)
LENELL, LAMA ([id number]) . Visit Report for 12/08/2020 Chief Complaint Document Details Patient Name: Date of Service: [name] 12/08/2020 8:00 A M Medical Record Number: [id number] Patient Account Number: [id number] Date of Birth/Sex: Treating RN: Oct 11, 1951 (69 y.o. [name] Primary Care Provider: [name], MA [name] Other Clinician: Referring Provider: Treating Provider/Extender: Hal Morales, MA [name] Weeks in Treatment: 4 Information Obtained from: Patient Chief Complaint Bilateral LE Ulcers Electronic Signature(s) Signed: 12/08/2020 8:02:18 AM By: Worthy Keeler PA-C Entered By: Worthy Keeler on 12/08/2020 08:02:18 -------------------------------------------------------------------------------- HPI Details Patient Name: Date of Service: [name], [name] 12/08/2020 8:00 A M Medical Record Number: [id number] Patient Account Number: [id number] Date of Birth/Sex: Treating RN: May 31, 1951 (69 y.o. [name] Primary Care Provider: [name], MA [name] Other Clinician: Referring Provider: Treating Provider/Extender: Hal Morales, MA [name] Weeks in Treatment: 4 History of Present Illness HPI Description: 11/10/2020 patient presents today for initial evaluation in this clinic although I have seen this patient in [place] previous. Subsequently his issue today is different from when I saw him for I was treating him for a toe ulcer. Currently he is having issues with bilateral lower extremity ulcers after running into a metal bar. Upon inspection today he has wounds of the bilateral lower extremities which are consistent with having struck his legs on the anterior aspect. With that being said the patient did go to the ER for evaluation on October 11 where he was given Keflex initially. He went back to the ER after not improving on October 20 he was given Lasix at that point he states the Lasix seem to help the most. Has been  using Neosporin and leaving this open area which is probably not the best way to go either to be honest. He has had arterial studies on March 2021 which showed a left ABI of 0.78 with a TBI of 0.34 and a right ABI of 1.18 with a TBI 1.18. Nonetheless his arterial flow the left is not ideal but also I think potentially consistent with allowing this to heal. I think he can also support light compression with Kerlix and Coban based on this result. His most recent hemoglobin A1c that we could find was 7.3 and that was towards the end of 2020. The patient does have a history of heart disease unfortunately. He is also currently still a smoker. 11/24/2020 on evaluation today patient appears to be doing well with regard to his wounds. In fact both appear to be doing better after last week's rather last visit's debridement. Fortunately there is no signs of active infection at this time. No fevers, chills, nausea, vomiting, or diarrhea. Overall I am very pleased with where things stand today. 12/01/2020 on evaluation today patient appears to be doing well with regard to his leg ulcers. He has been tolerating the dressing changes without complication. Fortunately there is no signs of active infection at this time. No fevers, chills, nausea, vomiting, or diarrhea. 12/08/2020 on evaluation today patient appears to be doing very well in regard to his bilateral lower extremity ulcers. They seem to be doing excellent and he is making great progress. There does not appear to be any evidence of active infection at this time. No fevers, chills, nausea, vomiting, or diarrhea. Electronic Signature(s) Signed: 12/08/2020 8:34:54 AM By: Worthy Keeler PA-C Entered By: Worthy Keeler on 12/08/2020 08:34:53 -------------------------------------------------------------------------------- Physical Exam Details Patient  Name: Date of Service: [name] 12/08/2020 8:00 A M Medical Record Number: [id number] Patient  Account Number: [id number] Date of Birth/Sex: Treating RN: 1951/05/22 (69 y.o. [name] Primary Care Provider: [name], MA [name] Other Clinician: Referring Provider: Treating Provider/Extender: Hal Morales, MA [name] Weeks in Treatment: 4 Constitutional Well-nourished and well-hydrated in no acute distress. Respiratory normal breathing without difficulty. Psychiatric this patient is able to make decisions and demonstrates good insight into disease process. Alert and Oriented x 3. pleasant and cooperative. Notes Upon inspection patient's wound bed actually showed signs of good granulation at this time. There does not appear to be any evidence of active infection which is great news and overall very pleased with where things stand. No fevers, chills, nausea, vomiting, or diarrhea. Overall I think he is making excellent progress which is great news. Electronic Signature(s) Signed: 12/08/2020 8:35:11 AM By: Worthy Keeler PA-C Entered By: Worthy Keeler on 12/08/2020 08:35:11 -------------------------------------------------------------------------------- Physician Orders Details Patient Name: Date of Service: [name], [name] 12/08/2020 8:00 A M Medical Record Number: [id number] Patient Account Number: [id number] Date of Birth/Sex: Treating RN: 06-09-1951 (69 y.o. [name] Primary Care Provider: [name], MA [name] Other Clinician: Referring Provider: Treating Provider/Extender: Hal Morales, MA [name] Weeks in Treatment: 4 Verbal / Phone Orders: No Diagnosis Coding ICD-10 Coding Code Description E11.622 Type 2 diabetes mellitus with other skin ulcer I89.0 Lymphedema, not elsewhere classified I87.2 Venous insufficiency (chronic) (peripheral) L97.812 Non-pressure chronic ulcer of other part of right lower leg with fat layer exposed L97.822 Non-pressure chronic ulcer of other part of left lower leg with fat layer exposed I25.10  Atherosclerotic heart disease of native coronary artery without angina pectoris Follow-up Appointments Return Appointment in 1 week. Bathing/ Shower/ Hygiene May shower with protection but do not get wound dressing(s) wet. Edema Control - Lymphedema / SCD / Other Bilateral Lower Extremities Elevate legs to the level of the heart or above for 30 minutes daily and/or when sitting, a frequency of: Avoid standing for long periods of time. Exercise regularly Wound Treatment Wound #1 - Lower Leg Wound Laterality: Right, Anterior Peri-Wound Care: Sween Lotion (Moisturizing lotion) 1 x Per Week Discharge Instructions: Apply moisturizing lotion as directed Prim Dressing: Hydrofera Blue Classic Foam, 2x2 in 1 x Per Week ary Discharge Instructions: Moisten with saline prior to applying to wound bed Secondary Dressing: Woven Gauze Sponge, Non-Sterile 4x4 in 1 x Per Week Discharge Instructions: Apply over primary dressing as directed. Compression Wrap: Kerlix Roll 4.5x3.1 (in/yd) 1 x Per Week Discharge Instructions: Apply Kerlix and Coban compression as directed. Compression Wrap: Coban Self-Adherent Wrap 4x5 (in/yd) 1 x Per Week Discharge Instructions: Apply over Kerlix as directed. Wound #2 - Lower Leg Wound Laterality: Left, Anterior Peri-Wound Care: Sween Lotion (Moisturizing lotion) 1 x Per Week Discharge Instructions: Apply moisturizing lotion as directed Prim Dressing: Hydrofera Blue Classic Foam, 2x2 in 1 x Per Week ary Discharge Instructions: Moisten with saline prior to applying to wound bed Secondary Dressing: Woven Gauze Sponge, Non-Sterile 4x4 in 1 x Per Week Discharge Instructions: Apply over primary dressing as directed. Compression Wrap: Kerlix Roll 4.5x3.1 (in/yd) 1 x Per Week Discharge Instructions: Apply Kerlix and Coban compression as directed. Compression Wrap: Coban Self-Adherent Wrap 4x5 (in/yd) 1 x Per Week Discharge Instructions: Apply over Kerlix as  directed. Electronic Signature(s) Signed: 12/08/2020 1:05:14 PM By: Worthy Keeler PA-C Signed: 12/08/2020 5:29:15  PM By: Baruch Gouty RN, BSN Entered By: Baruch Gouty on 12/08/2020 29:93:71 -------------------------------------------------------------------------------- Problem List Details Patient Name: Date of Service: [name], [name] 12/08/2020 8:00 A M Medical Record Number: [id number] Patient Account Number: [id number] Date of Birth/Sex: Treating RN: Mar 27, 1951 (69 y.o. [name] Primary Care Provider: [name], MA [name] Other Clinician: Referring Provider: Treating Provider/Extender: Hal Morales, MA [name] Weeks in Treatment: 4 Active Problems ICD-10 Encounter Code Description Active Date MDM Diagnosis E11.622 Type 2 diabetes mellitus with other skin ulcer 11/10/2020 No Yes I89.0 Lymphedema, not elsewhere classified 11/10/2020 No Yes I87.2 Venous insufficiency (chronic) (peripheral) 11/10/2020 No Yes L97.812 Non-pressure chronic ulcer of other part of right lower leg with fat layer 11/10/2020 No Yes exposed L97.822 Non-pressure chronic ulcer of other part of left lower leg with fat layer exposed11/17/2021 No Yes I25.10 Atherosclerotic heart disease of native coronary artery without angina pectoris 11/10/2020 No Yes Inactive Problems Resolved Problems Electronic Signature(s) Signed: 12/08/2020 8:02:13 AM By: Worthy Keeler PA-C Entered By: Worthy Keeler on 12/08/2020 08:02:13 -------------------------------------------------------------------------------- Progress Note Details Patient Name: Date of Service: [name], [name] 12/08/2020 8:00 A M Medical Record Number: [id number] Patient Account Number: [id number] Date of Birth/Sex: Treating RN: 1951/12/08 (69 y.o. [name] Primary Care Provider: [name], MA [name] Other Clinician: Referring Provider: Treating Provider/Extender: Hal Morales, MA [name] Weeks  in Treatment: 4 Subjective Chief Complaint Information obtained from Patient Bilateral LE Ulcers History of Present Illness (HPI) 11/10/2020 patient presents today for initial evaluation in this clinic although I have seen this patient in [place] previous. Subsequently his issue today is different from when I saw him for I was treating him for a toe ulcer. Currently he is having issues with bilateral lower extremity ulcers after running into a metal bar. Upon inspection today he has wounds of the bilateral lower extremities which are consistent with having struck his legs on the anterior aspect. With that being said the patient did go to the ER for evaluation on October 11 where he was given Keflex initially. He went back to the ER after not improving on October 20 he was given Lasix at that point he states the Lasix seem to help the most. Has been using Neosporin and leaving this open area which is probably not the best way to go either to be honest. He has had arterial studies on March 2021 which showed a left ABI of 0.78 with a TBI of 0.34 and a right ABI of 1.18 with a TBI 1.18. Nonetheless his arterial flow the left is not ideal but also I think potentially consistent with allowing this to heal. I think he can also support light compression with Kerlix and Coban based on this result. His most recent hemoglobin A1c that we could find was 7.3 and that was towards the end of 2020. The patient does have a history of heart disease unfortunately. He is also currently still a smoker. 11/24/2020 on evaluation today patient appears to be doing well with regard to his wounds. In fact both appear to be doing better after last week's rather last visit's debridement. Fortunately there is no signs of active infection at this time. No fevers, chills, nausea, vomiting, or diarrhea. Overall I am very pleased with where things stand today. 12/01/2020 on evaluation today patient appears to be doing well with  regard to his leg ulcers. He has been tolerating the dressing  changes without complication. Fortunately there is no signs of active infection at this time. No fevers, chills, nausea, vomiting, or diarrhea. 12/08/2020 on evaluation today patient appears to be doing very well in regard to his bilateral lower extremity ulcers. They seem to be doing excellent and he is making great progress. There does not appear to be any evidence of active infection at this time. No fevers, chills, nausea, vomiting, or diarrhea. Objective Constitutional Well-nourished and well-hydrated in no acute distress. Vitals Time Taken: 8:10 AM, Height: 71 in, Weight: 225 lbs, BMI: 31.4, Temperature: 97.6 F, Pulse: 110 bpm, Respiratory Rate: 18 breaths/min, Blood Pressure: 150/79 mmHg. Respiratory normal breathing without difficulty. Psychiatric this patient is able to make decisions and demonstrates good insight into disease process. Alert and Oriented x 3. pleasant and cooperative. General Notes: Upon inspection patient's wound bed actually showed signs of good granulation at this time. There does not appear to be any evidence of active infection which is great news and overall very pleased with where things stand. No fevers, chills, nausea, vomiting, or diarrhea. Overall I think he is making excellent progress which is great news. Integumentary (Hair, Skin) Wound #1 status is Open. Original cause of wound was Trauma. The wound is located on the Right,Anterior Lower Leg. The wound measures 2.2cm length x 0.9cm width x 0.1cm depth; 1.555cm^2 area and 0.156cm^3 volume. There is Fat Layer (Subcutaneous Tissue) exposed. There is no tunneling or undermining noted. There is a medium amount of serosanguineous drainage noted. The wound margin is flat and intact. There is medium (34-66%) red granulation within the wound bed. There is a medium (34-66%) amount of necrotic tissue within the wound bed including Adherent Slough. Wound  #2 status is Open. Original cause of wound was Trauma. The wound is located on the Left,Anterior Lower Leg. The wound measures 2.2cm length x 2.1cm width x 0.1cm depth; 3.629cm^2 area and 0.363cm^3 volume. There is Fat Layer (Subcutaneous Tissue) exposed. There is no tunneling or undermining noted. There is a medium amount of serosanguineous drainage noted. The wound margin is flat and intact. There is large (67-100%) red granulation within the wound bed. There is a small (1-33%) amount of necrotic tissue within the wound bed including Adherent Slough. Assessment Active Problems ICD-10 Type 2 diabetes mellitus with other skin ulcer Lymphedema, not elsewhere classified Venous insufficiency (chronic) (peripheral) Non-pressure chronic ulcer of other part of right lower leg with fat layer exposed Non-pressure chronic ulcer of other part of left lower leg with fat layer exposed Atherosclerotic heart disease of native coronary artery without angina pectoris Plan Follow-up Appointments: Return Appointment in 1 week. Bathing/ Shower/ Hygiene: May shower with protection but do not get wound dressing(s) wet. Edema Control - Lymphedema / SCD / Other: Elevate legs to the level of the heart or above for 30 minutes daily and/or when sitting, a frequency of: Avoid standing for long periods of time. Exercise regularly WOUND #1: - Lower Leg Wound Laterality: Right, Anterior Peri-Wound Care: Sween Lotion (Moisturizing lotion) 1 x Per Week/ Discharge Instructions: Apply moisturizing lotion as directed Prim Dressing: Hydrofera Blue Classic Foam, 2x2 in 1 x Per Week/ ary Discharge Instructions: Moisten with saline prior to applying to wound bed Secondary Dressing: Woven Gauze Sponge, Non-Sterile 4x4 in 1 x Per Week/ Discharge Instructions: Apply over primary dressing as directed. Com pression Wrap: Kerlix Roll 4.5x3.1 (in/yd) 1 x Per Week/ Discharge Instructions: Apply Kerlix and Coban compression as  directed. Com pression Wrap: Coban Self-Adherent Wrap 4x5 (in/yd) 1  x Per Week/ Discharge Instructions: Apply over Kerlix as directed. WOUND #2: - Lower Leg Wound Laterality: Left, Anterior Peri-Wound Care: Sween Lotion (Moisturizing lotion) 1 x Per Week/ Discharge Instructions: Apply moisturizing lotion as directed Prim Dressing: Hydrofera Blue Classic Foam, 2x2 in 1 x Per Week/ ary Discharge Instructions: Moisten with saline prior to applying to wound bed Secondary Dressing: Woven Gauze Sponge, Non-Sterile 4x4 in 1 x Per Week/ Discharge Instructions: Apply over primary dressing as directed. Com pression Wrap: Kerlix Roll 4.5x3.1 (in/yd) 1 x Per Week/ Discharge Instructions: Apply Kerlix and Coban compression as directed. Com pression Wrap: Coban Self-Adherent Wrap 4x5 (in/yd) 1 x Per Week/ Discharge Instructions: Apply over Kerlix as directed. 1. Would recommend currently that we going continue with the wound care measures as before the patient is in agreement the plan we are using Hydrofera Blue followed by Kerlix and Coban wrap. 2. I am also can recommend at this time we have the patient continue to elevate his legs much as possible he has been having some cramping with his legs are down. With that being said his blood flow in the right leg is good that is where he is having the cramping I do not believe this is due to blood flow could have to do with his back. We will see patient back for reevaluation in 1 week here in the clinic. If anything worsens or changes patient will contact our office for additional recommendations. Electronic Signature(s) Signed: 12/08/2020 8:37:08 AM By: Worthy Keeler PA-C Previous Signature: 12/08/2020 8:36:01 AM Version By: Worthy Keeler PA-C Entered By: Worthy Keeler on 12/08/2020 08:37:07 -------------------------------------------------------------------------------- SuperBill Details Patient Name: Date of Service: [name], [name]  12/08/2020 Medical Record Number: [id number] Patient Account Number: [id number] Date of Birth/Sex: Treating RN: 1951/10/29 (69 y.o. [name] Primary Care Provider: [name], MA [name] Other Clinician: Referring Provider: Treating Provider/Extender: Hal Morales, MA [name] Weeks in Treatment: 4 Diagnosis Coding ICD-10 Codes Code Description E11.622 Type 2 diabetes mellitus with other skin ulcer I89.0 Lymphedema, not elsewhere classified I87.2 Venous insufficiency (chronic) (peripheral) L97.812 Non-pressure chronic ulcer of other part of right lower leg with fat layer exposed L97.822 Non-pressure chronic ulcer of other part of left lower leg with fat layer exposed I25.10 Atherosclerotic heart disease of native coronary artery without angina pectoris Facility Procedures CPT4 Code: [id number] Description: 99214 - WOUND CARE VISIT-LEV 4 EST PT Modifier: Quantity: 1 Physician Procedures : CPT4 Code Description Modifier [id number] - WC PHYS LEVEL 3 - EST PT ICD-10 Diagnosis Description E11.622 Type 2 diabetes mellitus with other skin ulcer I89.0 Lymphedema, not elsewhere classified I87.2 Venous insufficiency (chronic) (peripheral)  L97.812 Non-pressure chronic ulcer of other part of right lower leg with fat layer exposed Quantity: 1 Electronic Signature(s) Signed: 12/08/2020 8:36:12 AM By: Worthy Keeler PA-C Entered By: Worthy Keeler on 12/08/2020 08:36:12

## 2020-12-10 DIAGNOSIS — G8929 Other chronic pain: Secondary | ICD-10-CM | POA: Diagnosis not present

## 2020-12-10 DIAGNOSIS — M25512 Pain in left shoulder: Secondary | ICD-10-CM | POA: Diagnosis not present

## 2020-12-10 NOTE — Progress Notes (Signed)
CAMDAN, BURDI ([id number]) . Visit Report for 12/08/2020 Arrival Information Details Patient Name: Date of Service: [name]. 12/08/2020 8:00 A M Medical Record Number: [id number] Patient Account Number: [id number] Date of Birth/Sex: Treating RN: 03-Apr-1951 (69 y.o. [name] Primary Care Mandela Bello: [name], MA [name] Other Clinician: Referring Sila Sarsfield: Treating Hadasa Gasner/Extender: Hal Morales, MA [name] Weeks in Treatment: 4 Visit Information History Since Last Visit Added or deleted any medications: No Patient Arrived: Ambulatory Any new allergies or adverse reactions: No Arrival Time: 08:06 Had a fall or experienced change in No Accompanied By: alone activities of daily living that may affect Transfer Assistance: None risk of falls: Patient Identification Verified: Yes Signs or symptoms of abuse/neglect since last visito No Secondary Verification Process Completed: Yes Hospitalized since last visit: No Patient Requires Transmission-Based Precautions: No Implantable device outside of the clinic excluding No Patient Has Alerts: Yes cellular tissue based products placed in the center Patient Alerts: Patient on Blood Thinner since last visit: L ABI: 0.78 TBI: 0.34 Has Dressing in Place as Prescribed: Yes R ABI: 1.18 TBI: 1.18 Has Compression in Place as Prescribed: Yes Pain Present Now: No Electronic Signature(s) Signed: 12/10/2020 5:46:53 PM By: Levan Hurst RN, BSN Entered By: Levan Hurst on 12/08/2020 08:10:08 -------------------------------------------------------------------------------- Clinic Level of Care Assessment Details Patient Name: Date of Service: [name], Marcus [name] 12/08/2020 8:00 A M Medical Record Number: [id number] Patient Account Number: [id number] Date of Birth/Sex: Treating RN: [birth year] (69 y.o. [name] Primary Care Rick Carruthers: [name], MA [name] Other Clinician: Referring Gurpreet Mariani: Treating  Emali Heyward/Extender: Hal Morales, MA [name] Weeks in Treatment: 4 Clinic Level of Care Assessment Items TOOL 4 Quantity Score []  - 0 Use when only an EandM is performed on FOLLOW-UP visit ASSESSMENTS - Nursing Assessment / Reassessment X- 1 10 Reassessment of Co-morbidities (includes updates in patient status) X- 1 5 Reassessment of Adherence to Treatment Plan ASSESSMENTS - Wound and Skin A ssessment / Reassessment []  - 0 Simple Wound Assessment / Reassessment - one wound X- 2 5 Complex Wound Assessment / Reassessment - multiple wounds []  - 0 Dermatologic / Skin Assessment (not related to wound area) ASSESSMENTS - Focused Assessment X- 2 5 Circumferential Edema Measurements - multi extremities []  - 0 Nutritional Assessment / Counseling / Intervention X- 1 5 Lower Extremity Assessment (monofilament, tuning fork, pulses) []  - 0 Peripheral Arterial Disease Assessment (using hand held doppler) ASSESSMENTS - Ostomy and/or Continence Assessment and Care []  - 0 Incontinence Assessment and Management []  - 0 Ostomy Care Assessment and Management (repouching, etc.) PROCESS - Coordination of Care X - Simple Patient / Family Education for ongoing care 1 15 []  - 0 Complex (extensive) Patient / Family Education for ongoing care X- 1 10 Staff obtains Programmer, systems, Records, T Results / Process Orders est []  - 0 Staff telephones HHA, Nursing Homes / Clarify orders / etc []  - 0 Routine Transfer to another Facility (non-emergent condition) []  - 0 Routine Hospital Admission (non-emergent condition) []  - 0 New Admissions / Biomedical engineer / Ordering NPWT Apligraf, etc. , []  - 0 Emergency Hospital Admission (emergent condition) X- 1 10 Simple Discharge Coordination []  - 0 Complex (extensive) Discharge Coordination PROCESS - Special Needs []  - 0 Pediatric / Minor Patient Management []  - 0 Isolation Patient Management []  - 0 Hearing / Language / Visual special  needs []  - 0 Assessment of Community assistance (transportation, D/C planning, etc.) []  -  0 Additional assistance / Altered mentation []  - 0 Support Surface(s) Assessment (bed, cushion, seat, etc.) INTERVENTIONS - Wound Cleansing / Measurement []  - 0 Simple Wound Cleansing - one wound X- 2 5 Complex Wound Cleansing - multiple wounds X- 1 5 Wound Imaging (photographs - any number of wounds) []  - 0 Wound Tracing (instead of photographs) []  - 0 Simple Wound Measurement - one wound X- 2 5 Complex Wound Measurement - multiple wounds INTERVENTIONS - Wound Dressings X - Small Wound Dressing one or multiple wounds 2 10 []  - 0 Medium Wound Dressing one or multiple wounds []  - 0 Large Wound Dressing one or multiple wounds X- 1 5 Application of Medications - topical []  - 0 Application of Medications - injection INTERVENTIONS - Miscellaneous []  - 0 External ear exam []  - 0 Specimen Collection (cultures, biopsies, blood, body fluids, etc.) []  - 0 Specimen(s) / Culture(s) sent or taken to Lab for analysis []  - 0 Patient Transfer (multiple staff / Civil Service fast streamer / Similar devices) []  - 0 Simple Staple / Suture removal (25 or less) []  - 0 Complex Staple / Suture removal (26 or more) []  - 0 Hypo / Hyperglycemic Management (close monitor of Blood Glucose) []  - 0 Ankle / Brachial Index (ABI) - do not check if billed separately X- 1 5 Vital Signs Has the patient been seen at the hospital within the last three years: Yes Total Score: 130 Level Of Care: New/Established - Level 4 Electronic Signature(s) Signed: 12/08/2020 5:29:15 PM By: Baruch Gouty RN, BSN Entered By: Baruch Gouty on 12/08/2020 08:32:07 -------------------------------------------------------------------------------- Encounter Discharge Information Details Patient Name: Date of Service: [name], Marcus [name] 12/08/2020 8:00 A M Medical Record Number: [id number] Patient Account Number: [id number] Date of  Birth/Sex: Treating RN: 21-Feb-1951 (69 y.o. Erie Noe Primary Care Aniel Hubble: [name], MA [name] Other Clinician: Referring Cambelle Suchecki: Treating Fender Herder/Extender: Hal Morales, MA [name] Weeks in Treatment: 4 Encounter Discharge Information Items Discharge Condition: Stable Ambulatory Status: Ambulatory Discharge Destination: Home Transportation: Private Auto Accompanied By: self Schedule Follow-up Appointment: Yes Clinical Summary of Care: Patient Declined Electronic Signature(s) Signed: 12/08/2020 9:59:17 AM By: Rhae Hammock RN Entered By: Rhae Hammock on 12/08/2020 08:37:23 -------------------------------------------------------------------------------- Lower Extremity Assessment Details Patient Name: Date of Service: [name], Marcus [name] 12/08/2020 8:00 A M Medical Record Number: [id number] Patient Account Number: [id number] Date of Birth/Sex: Treating RN: Oct 05, 1951 (69 y.o. [name] Primary Care Sherese Heyward: [name], MA [name] Other Clinician: Referring Imogen Maddalena: Treating Macari Zalesky/Extender: Hal Morales, MA [name] Weeks in Treatment: 4 Edema Assessment Assessed: [Left: No] [Right: No] Edema: [Left: No] [Right: No] Calf Left: Right: Point of Measurement: 35 cm From Medial Instep 36 cm 38 cm Ankle Left: Right: Point of Measurement: 11 cm From Medial Instep 24 cm 24 cm Vascular Assessment Pulses: Dorsalis Pedis Palpable: [Left:Yes] [Right:Yes] Electronic Signature(s) Signed: 12/10/2020 5:46:53 PM By: Levan Hurst RN, BSN Entered By: Levan Hurst on 12/08/2020 08:14:29 -------------------------------------------------------------------------------- Multi-Disciplinary Care Plan Details Patient Name: Date of Service: [name], Marcus [name] 12/08/2020 8:00 A M Medical Record Number: [id number] Patient Account Number: [id number] Date of Birth/Sex: Treating RN: [birth year] (69 y.o. [name] Primary Care Markail Diekman:  [name], MA [name] Other Clinician: Referring Milon Dethloff: Treating Garnie Borchardt/Extender: Hal Morales, MA [name] Weeks in Treatment: 4 Active Inactive Nutrition Nursing Diagnoses: Impaired glucose control: actual or potential Potential for alteratiion in Nutrition/Potential for imbalanced nutrition  Goals: Patient/caregiver will maintain therapeutic glucose control Date Initiated: 11/10/2020 Target Resolution Date: 01/05/2021 Goal Status: Active Interventions: Assess HgA1c results as ordered upon admission and as needed Provide education on elevated blood sugars and impact on wound healing Treatment Activities: Patient referred to Primary Care Physician for further nutritional evaluation : 11/10/2020 Notes: Venous Leg Ulcer Nursing Diagnoses: Knowledge deficit related to disease process and management Potential for venous Insuffiency (use before diagnosis confirmed) Goals: Patient will maintain optimal edema control Date Initiated: 11/10/2020 Target Resolution Date: 01/05/2021 Goal Status: Active Interventions: Assess peripheral edema status every visit. Compression as ordered Provide education on venous insufficiency Treatment Activities: Therapeutic compression applied : 11/10/2020 Notes: Wound/Skin Impairment Nursing Diagnoses: Impaired tissue integrity Knowledge deficit related to smoking impact on wound healing Knowledge deficit related to ulceration/compromised skin integrity Goals: Patient will demonstrate a reduced rate of smoking or cessation of smoking Date Initiated: 11/10/2020 Target Resolution Date: 01/05/2021 Goal Status: Active Patient/caregiver will verbalize understanding of skin care regimen Date Initiated: 11/10/2020 Target Resolution Date: 01/05/2021 Goal Status: Active Ulcer/skin breakdown will have a volume reduction of 30% by week 4 Date Initiated: 11/10/2020 Date Inactivated: 12/08/2020 Target Resolution Date: 12/08/2020 Goal Status:  Met Ulcer/skin breakdown will have a volume reduction of 50% by week 8 Date Initiated: 12/08/2020 Target Resolution Date: 01/05/2021 Goal Status: Active Interventions: Assess patient/caregiver ability to obtain necessary supplies Assess patient/caregiver ability to perform ulcer/skin care regimen upon admission and as needed Assess ulceration(s) every visit Treatment Activities: Skin care regimen initiated : 11/10/2020 Topical wound management initiated : 11/10/2020 Notes: Electronic Signature(s) Signed: 12/08/2020 5:29:15 PM By: Baruch Gouty RN, BSN Entered By: Baruch Gouty on 12/08/2020 08:30:51 -------------------------------------------------------------------------------- Pain Assessment Details Patient Name: Date of Service: [name], Marcus [name] 12/08/2020 8:00 A M Medical Record Number: [id number] Patient Account Number: [id number] Date of Birth/Sex: Treating RN: September 13, 1951 (69 y.o. [name] Primary Care Ashla Murph: [name], MA [name] Other Clinician: Referring Delaine Canter: Treating Akasha Melena/Extender: Hal Morales, MA [name] Weeks in Treatment: 4 Active Problems Location of Pain Severity and Description of Pain Patient Has Paino No Site Locations Pain Management and Medication Current Pain Management: Electronic Signature(s) Signed: 12/10/2020 5:46:53 PM By: Levan Hurst RN, BSN Entered By: Levan Hurst on 12/08/2020 08:11:13 -------------------------------------------------------------------------------- Patient/Caregiver Education Details Patient Name: Date of Service: [name] 12/15/2021andnbsp8:00 A M Medical Record Number: [id number] Patient Account Number: [id number] Date of Birth/Gender: Treating RN: [birth year] (69 y.o. [name] Primary Care Physician: [name], MA [name] Other Clinician: Referring Physician: Treating Physician/Extender: Hal Morales, MA [name] Weeks in Treatment: 4 Education  Assessment Education Provided To: Patient Education Topics Provided Elevated Blood Sugar/ Impact on Healing: Methods: Explain/Verbal Responses: Reinforcements needed, State content correctly Venous: Methods: Explain/Verbal Responses: Reinforcements needed, State content correctly Electronic Signature(s) Signed: 12/08/2020 5:29:15 PM By: Baruch Gouty RN, BSN Entered By: Baruch Gouty on 12/08/2020 08:31:14 -------------------------------------------------------------------------------- Wound Assessment Details Patient Name: Date of Service: [name], Marcus [name] 12/08/2020 8:00 A M Medical Record Number: [id number] Patient Account Number: [id number] Date of Birth/Sex: Treating RN: 03/05/1951 (69 y.o. [name] Primary Care Harlem Thresher: [name], MA [name] Other Clinician: Referring Jatoria Kneeland: Treating Anabelle Bungert/Extender: Hal Morales, MA [name] Weeks in Treatment: 4 Wound Status Wound Number: 1 Primary Venous Leg Ulcer Etiology: Wound Location: Right, Anterior Lower Leg Wound Open Wounding Event: Trauma Status: Date Acquired: 09/24/2020 Comorbid Cataracts, Arrhythmia, Coronary Artery Disease, Hypertension,  Weeks Of Treatment: 4 History: Myocardial Infarction, Peripheral Arterial Disease, Peripheral Clustered Wound: No Venous Disease, Type II Diabetes, Osteoarthritis, Neuropathy, Received Radiation Photos Photo Uploaded By: Mikeal Hawthorne on 12/10/2020 13:19:38 Wound Measurements Length: (cm) 2.2 Width: (cm) 0.9 Depth: (cm) 0.1 Area: (cm) 1.555 Volume: (cm) 0.156 % Reduction in Area: 34% % Reduction in Volume: 66.9% Epithelialization: Small (1-33%) Tunneling: No Undermining: No Wound Description Classification: Full Thickness Without Exposed Support Structures Wound Margin: Flat and Intact Exudate Amount: Medium Exudate Type: Serosanguineous Exudate Color: red, brown Foul Odor After Cleansing: No Slough/Fibrino Yes Wound Bed Granulation  Amount: Medium (34-66%) Exposed Structure Granulation Quality: Red Fascia Exposed: No Necrotic Amount: Medium (34-66%) Fat Layer (Subcutaneous Tissue) Exposed: Yes Necrotic Quality: Adherent Slough Tendon Exposed: No Muscle Exposed: No Joint Exposed: No Bone Exposed: No Treatment Notes Wound #1 (Lower Leg) Wound Laterality: Right, Anterior Cleanser Peri-Wound Care Sween Lotion (Moisturizing lotion) Discharge Instruction: Apply moisturizing lotion as directed Topical Primary Dressing Hydrofera Blue Classic Foam, 2x2 in Discharge Instruction: Moisten with saline prior to applying to wound bed Secondary Dressing Woven Gauze Sponge, Non-Sterile 4x4 in Discharge Instruction: Apply over primary dressing as directed. Secured With Compression Wrap Kerlix Roll 4.5x3.1 (in/yd) Discharge Instruction: Apply Kerlix and Coban compression as directed. Coban Self-Adherent Wrap 4x5 (in/yd) Discharge Instruction: Apply over Kerlix as directed. Compression Stockings Add-Ons Electronic Signature(s) Signed: 12/10/2020 5:46:53 PM By: Levan Hurst RN, BSN Entered By: Levan Hurst on 12/08/2020 08:16:05 -------------------------------------------------------------------------------- Wound Assessment Details Patient Name: Date of Service: [name], Marcus [name] 12/08/2020 8:00 A M Medical Record Number: [id number] Patient Account Number: [id number] Date of Birth/Sex: Treating RN: June 14, 1951 (69 y.o. [name] Primary Care Kolston Lacount: [name], MA [name] Other Clinician: Referring Kimala Horne: Treating Konner Warrior/Extender: Hal Morales, MA [name] Weeks in Treatment: 4 Wound Status Wound Number: 2 Primary Venous Leg Ulcer Etiology: Wound Location: Left, Anterior Lower Leg Wound Open Wounding Event: Trauma Status: Date Acquired: 09/24/2020 Comorbid Cataracts, Arrhythmia, Coronary Artery Disease, Hypertension, Weeks Of Treatment: 4 History: Myocardial Infarction, Peripheral  Arterial Disease, Peripheral Clustered Wound: No Venous Disease, Type II Diabetes, Osteoarthritis, Neuropathy, Received Radiation Photos Photo Uploaded By: Mikeal Hawthorne on 12/10/2020 13:19:39 Wound Measurements Length: (cm) 2.2 Width: (cm) 2.1 Depth: (cm) 0.1 Area: (cm) 3.629 Volume: (cm) 0.363 % Reduction in Area: 3.7% % Reduction in Volume: 51.9% Epithelialization: Small (1-33%) Tunneling: No Undermining: No Wound Description Classification: Full Thickness Without Exposed Support Structures Wound Margin: Flat and Intact Exudate Amount: Medium Exudate Type: Serosanguineous Exudate Color: red, brown Foul Odor After Cleansing: No Slough/Fibrino Yes Wound Bed Granulation Amount: Large (67-100%) Exposed Structure Granulation Quality: Red Fascia Exposed: No Necrotic Amount: Small (1-33%) Fat Layer (Subcutaneous Tissue) Exposed: Yes Necrotic Quality: Adherent Slough Tendon Exposed: No Muscle Exposed: No Joint Exposed: No Bone Exposed: No Treatment Notes Wound #2 (Lower Leg) Wound Laterality: Left, Anterior Cleanser Peri-Wound Care Sween Lotion (Moisturizing lotion) Discharge Instruction: Apply moisturizing lotion as directed Topical Primary Dressing Hydrofera Blue Classic Foam, 2x2 in Discharge Instruction: Moisten with saline prior to applying to wound bed Secondary Dressing Woven Gauze Sponge, Non-Sterile 4x4 in Discharge Instruction: Apply over primary dressing as directed. Secured With Compression Wrap Kerlix Roll 4.5x3.1 (in/yd) Discharge Instruction: Apply Kerlix and Coban compression as directed. Coban Self-Adherent Wrap 4x5 (in/yd) Discharge Instruction: Apply over Kerlix as directed. Compression Stockings Add-Ons Electronic Signature(s) Signed: 12/10/2020 5:46:53 PM By: Levan Hurst RN, BSN Entered By: Levan Hurst on 12/08/2020 08:15:41 -------------------------------------------------------------------------------- [place] Details Patient  Name: Date of  Service: [name], Marcus Bishop M T. 12/08/2020 8:00 A M Medical Record Number: [id number] Patient Account Number: [id number] Date of Birth/Sex: Treating RN: 1951-07-30 (69 y.o. [name] Primary Care Teauna Dubach: [name], MA [name] Other Clinician: Referring Karie Skowron: Treating Azad Calame/Extender: Hal Morales, MA [name] Weeks in Treatment: 4 Vital Signs Time Taken: 08:10 Temperature (F): 97.6 Height (in): 71 Pulse (bpm): 110 Weight (lbs): 225 Respiratory Rate (breaths/min): 18 Body Mass Index (BMI): 31.4 Blood Pressure (mmHg): 150/79 Reference Range: 80 - 120 mg / dl Electronic Signature(s) Signed: 12/10/2020 5:46:53 PM By: Levan Hurst RN, BSN Entered By: Levan Hurst on 12/08/2020 08:11:07

## 2020-12-15 ENCOUNTER — Encounter (HOSPITAL_BASED_OUTPATIENT_CLINIC_OR_DEPARTMENT_OTHER): Payer: PPO | Admitting: Physician Assistant

## 2020-12-15 ENCOUNTER — Other Ambulatory Visit: Payer: Self-pay

## 2020-12-15 DIAGNOSIS — E11622 Type 2 diabetes mellitus with other skin ulcer: Secondary | ICD-10-CM | POA: Diagnosis not present

## 2020-12-15 DIAGNOSIS — L97812 Non-pressure chronic ulcer of other part of right lower leg with fat layer exposed: Secondary | ICD-10-CM | POA: Diagnosis not present

## 2020-12-15 DIAGNOSIS — L97822 Non-pressure chronic ulcer of other part of left lower leg with fat layer exposed: Secondary | ICD-10-CM | POA: Diagnosis not present

## 2020-12-15 DIAGNOSIS — L988 Other specified disorders of the skin and subcutaneous tissue: Secondary | ICD-10-CM | POA: Diagnosis not present

## 2020-12-15 NOTE — Progress Notes (Signed)
[name] ([id number]) . Visit Report for 12/15/2020 Arrival Information Details Patient Name: Date of Service: [name]. 12/15/2020 8:45 A M Medical Record Number: [id number] Patient Account Number: [id number] Date of Birth/Sex: Treating RN: 1951/04/23 (69 y.o. [name], [name] Primary Care Mabelle Mungin: Marcus Mallick, MA [name] Other Clinician: Referring Keeli Roberg: Treating Rylynne Schicker/Extender: Hal Morales, MA [name] Weeks in Treatment: 5 Visit Information History Since Last Visit Added or deleted any medications: No Patient Arrived: Ambulatory Any new allergies or adverse reactions: No Arrival Time: 08:30 Had a fall or experienced change in No Accompanied By: self activities of daily living that may affect Transfer Assistance: None risk of falls: Patient Identification Verified: Yes Signs or symptoms of abuse/neglect since last visito No Secondary Verification Process Completed: Yes Hospitalized since last visit: No Patient Requires Transmission-Based Precautions: No Implantable device outside of the clinic excluding No Patient Has Alerts: Yes cellular tissue based products placed in the center Patient Alerts: Patient on Blood Thinner since last visit: L ABI: 0.78 TBI: 0.34 Has Dressing in Place as Prescribed: Yes R ABI: 1.18 TBI: 1.18 Has Compression in Place as Prescribed: Yes Pain Present Now: Yes Electronic Signature(s) Signed: 12/15/2020 5:18:14 PM By: Deon Pilling Entered By: Deon Pilling on 12/15/2020 08:42:37 -------------------------------------------------------------------------------- Clinic Level of Care Assessment Details Patient Name: Date of Service: [name] 12/15/2020 8:45 A M Medical Record Number: [id number] Patient Account Number: [id number] Date of Birth/Sex: Treating RN: 1951-04-03 (69 y.o. [name] Primary Care Jilleen Essner: Marcus Mallick, MA [name] Other Clinician: Referring Sherlie Boyum: Treating Ammy Lienhard/Extender:  Hal Morales, MA [name] Weeks in Treatment: 5 Clinic Level of Care Assessment Items TOOL 4 Quantity Score X- 1 0 Use when only an EandM is performed on FOLLOW-UP visit ASSESSMENTS - Nursing Assessment / Reassessment X- 1 10 Reassessment of Co-morbidities (includes updates in patient status) X- 1 5 Reassessment of Adherence to Treatment Plan ASSESSMENTS - Wound and Skin A ssessment / Reassessment []  - 0 Simple Wound Assessment / Reassessment - one wound X- 2 5 Complex Wound Assessment / Reassessment - multiple wounds []  - 0 Dermatologic / Skin Assessment (not related to wound area) ASSESSMENTS - Focused Assessment []  - 0 Circumferential Edema Measurements - multi extremities []  - 0 Nutritional Assessment / Counseling / Intervention X- 1 5 Lower Extremity Assessment (monofilament, tuning fork, pulses) []  - 0 Peripheral Arterial Disease Assessment (using hand held doppler) ASSESSMENTS - Ostomy and/or Continence Assessment and Care []  - 0 Incontinence Assessment and Management []  - 0 Ostomy Care Assessment and Management (repouching, etc.) PROCESS - Coordination of Care X - Simple Patient / Family Education for ongoing care 1 15 []  - 0 Complex (extensive) Patient / Family Education for ongoing care X- 1 10 Staff obtains Programmer, systems, Records, T Results / Process Orders est []  - 0 Staff telephones HHA, Nursing Homes / Clarify orders / etc []  - 0 Routine Transfer to another Facility (non-emergent condition) []  - 0 Routine Hospital Admission (non-emergent condition) []  - 0 New Admissions / Biomedical engineer / Ordering NPWT Apligraf, etc. , []  - 0 Emergency Hospital Admission (emergent condition) X- 1 10 Simple Discharge Coordination []  - 0 Complex (extensive) Discharge Coordination PROCESS - Special Needs []  - 0 Pediatric / Minor Patient Management []  - 0 Isolation Patient Management []  - 0 Hearing / Language / Visual special needs []  -  0 Assessment of Community assistance (transportation, D/C planning, etc.) []  -  0 Additional assistance / Altered mentation []  - 0 Support Surface(s) Assessment (bed, cushion, seat, etc.) INTERVENTIONS - Wound Cleansing / Measurement []  - 0 Simple Wound Cleansing - one wound X- 2 5 Complex Wound Cleansing - multiple wounds X- 1 5 Wound Imaging (photographs - any number of wounds) []  - 0 Wound Tracing (instead of photographs) []  - 0 Simple Wound Measurement - one wound X- 2 5 Complex Wound Measurement - multiple wounds INTERVENTIONS - Wound Dressings []  - 0 Small Wound Dressing one or multiple wounds []  - 0 Medium Wound Dressing one or multiple wounds X- 2 20 Large Wound Dressing one or multiple wounds []  - 0 Application of Medications - topical []  - 0 Application of Medications - injection INTERVENTIONS - Miscellaneous []  - 0 External ear exam []  - 0 Specimen Collection (cultures, biopsies, blood, body fluids, etc.) []  - 0 Specimen(s) / Culture(s) sent or taken to Lab for analysis []  - 0 Patient Transfer (multiple staff / Civil Service fast streamer / Similar devices) []  - 0 Simple Staple / Suture removal (25 or less) []  - 0 Complex Staple / Suture removal (26 or more) []  - 0 Hypo / Hyperglycemic Management (close monitor of Blood Glucose) []  - 0 Ankle / Brachial Index (ABI) - do not check if billed separately X- 1 5 Vital Signs Has the patient been seen at the hospital within the last three years: Yes Total Score: 135 Level Of Care: New/Established - Level 4 Electronic Signature(s) Signed: 12/15/2020 7:00:57 PM By: Levan Hurst RN, BSN Entered By: Levan Hurst on 12/15/2020 09:17:43 -------------------------------------------------------------------------------- Encounter Discharge Information Details Patient Name: Date of Service: [name], Elder Marcus T. 12/15/2020 8:45 A M Medical Record Number: [id number] Patient Account Number: [id number] Date of Birth/Sex: Treating  RN: Nov 02, 1951 (69 y.o. [name] Primary Care Marion Rosenberry: Marcus Mallick, MA [name] Other Clinician: Referring Blondina Coderre: Treating Maryclaire Stoecker/Extender: Hal Morales, MA [name] Weeks in Treatment: 5 Encounter Discharge Information Items Discharge Condition: Stable Ambulatory Status: Ambulatory Discharge Destination: Home Transportation: Private Auto Accompanied By: self Schedule Follow-up Appointment: Yes Clinical Summary of Care: Electronic Signature(s) Signed: 12/15/2020 5:18:14 PM By: Deon Pilling Entered By: Deon Pilling on 12/15/2020 09:33:25 -------------------------------------------------------------------------------- Lower Extremity Assessment Details Patient Name: Date of Service: [name] 12/15/2020 8:45 A M Medical Record Number: [id number] Patient Account Number: [id number] Date of Birth/Sex: Treating RN: 07/18/1951 (69 y.o. [name] Primary Care Eben Choinski: Marcus Mallick, MA [name] Other Clinician: Referring Ruslan Mccabe: Treating Dewon Mendizabal/Extender: Hal Morales, MA [name] Weeks in Treatment: 5 Edema Assessment Assessed: [Left: Yes] [Right: Yes] Edema: [Left: No] [Right: No] Calf Left: Right: Point of Measurement: 35 cm From Medial Instep 35.5 cm 38.5 cm Ankle Left: Right: Point of Measurement: 11 cm From Medial Instep 23 cm 23 cm Vascular Assessment Pulses: Dorsalis Pedis Palpable: [Left:Yes] [Right:Yes] Electronic Signature(s) Signed: 12/15/2020 5:18:14 PM By: Deon Pilling Entered By: Deon Pilling on 12/15/2020 08:43:37 -------------------------------------------------------------------------------- Multi-Disciplinary Care Plan Details Patient Name: Date of Service: [name], Elder Marcus T. 12/15/2020 8:45 A M Medical Record Number: [id number] Patient Account Number: [id number] Date of Birth/Sex: Treating RN: [birth year] (69 y.o. [name] Primary Care Wen Munford: Marcus Mallick, MA [name] Other Clinician: Referring  Demar Shad: Treating Barnie Sopko/Extender: Hal Morales, MA [name] Weeks in Treatment: 5 Active Inactive Nutrition Nursing Diagnoses: Impaired glucose control: actual or potential Potential for alteratiion in Nutrition/Potential for imbalanced nutrition Goals: Patient/caregiver will maintain therapeutic glucose  control Date Initiated: 11/10/2020 Target Resolution Date: 01/05/2021 Goal Status: Active Interventions: Assess HgA1c results as ordered upon admission and as needed Provide education on elevated blood sugars and impact on wound healing Treatment Activities: Patient referred to Primary Care Physician for further nutritional evaluation : 11/10/2020 Notes: Venous Leg Ulcer Nursing Diagnoses: Knowledge deficit related to disease process and management Potential for venous Insuffiency (use before diagnosis confirmed) Goals: Patient will maintain optimal edema control Date Initiated: 11/10/2020 Target Resolution Date: 01/05/2021 Goal Status: Active Interventions: Assess peripheral edema status every visit. Compression as ordered Provide education on venous insufficiency Treatment Activities: Therapeutic compression applied : 11/10/2020 Notes: Wound/Skin Impairment Nursing Diagnoses: Impaired tissue integrity Knowledge deficit related to smoking impact on wound healing Knowledge deficit related to ulceration/compromised skin integrity Goals: Patient will demonstrate a reduced rate of smoking or cessation of smoking Date Initiated: 11/10/2020 Target Resolution Date: 01/05/2021 Goal Status: Active Patient/caregiver will verbalize understanding of skin care regimen Date Initiated: 11/10/2020 Target Resolution Date: 01/05/2021 Goal Status: Active Ulcer/skin breakdown will have a volume reduction of 30% by week 4 Date Initiated: 11/10/2020 Date Inactivated: 12/08/2020 Target Resolution Date: 12/08/2020 Goal Status: Met Ulcer/skin breakdown will have a volume  reduction of 50% by week 8 Date Initiated: 12/08/2020 Target Resolution Date: 01/05/2021 Goal Status: Active Interventions: Assess patient/caregiver ability to obtain necessary supplies Assess patient/caregiver ability to perform ulcer/skin care regimen upon admission and as needed Assess ulceration(s) every visit Treatment Activities: Skin care regimen initiated : 11/10/2020 Topical wound management initiated : 11/10/2020 Notes: Electronic Signature(s) Signed: 12/15/2020 7:00:57 PM By: Levan Hurst RN, BSN Entered By: Levan Hurst on 12/15/2020 08:38:56 -------------------------------------------------------------------------------- Pain Assessment Details Patient Name: Date of Service: [name], Elder Marcus T. 12/15/2020 8:45 A M Medical Record Number: [id number] Patient Account Number: [id number] Date of Birth/Sex: Treating RN: 12-Mar-1951 (69 y.o. [name] Primary Care Labrian Torregrossa: Marcus Mallick, MA [name] Other Clinician: Referring Earlin Sweeden: Treating Tiffany Talarico/Extender: Hal Morales, MA [name] Weeks in Treatment: 5 Active Problems Location of Pain Severity and Description of Pain Patient Has Paino Yes Site Locations Pain Location: Generalized Pain, Pain in Ulcers Rate the pain. Current Pain Level: 2 Worst Pain Level: 10 Least Pain Level: 0 Tolerable Pain Level: 7 Pain Management and Medication Current Pain Management: Medication: Yes Cold Application: No Rest: Yes Massage: No Activity: No SandovalE.N.S.: No Heat Application: No Leg drop or elevation: Yes Is the Current Pain Management Adequate: Adequate How does your wound impact your activities of daily livingo Sleep: Yes Bathing: No Appetite: No Relationship With Others: No Bladder Continence: No Emotions: No Bowel Continence: No Work: Yes Toileting: No Drive: No Dressing: No Hobbies: Astronomer) Signed: 12/15/2020 5:18:14 PM By: Deon Pilling Entered By: Deon Pilling on  12/15/2020 08:43:17 -------------------------------------------------------------------------------- Patient/Caregiver Education Details Patient Name: Date of Service: Marcus Murlean Hark 12/22/2021andnbsp8:45 A M Medical Record Number: [id number] Patient Account Number: [id number] Date of Birth/Gender: Treating RN: Jul 21, 1951 (69 y.o. [name] Primary Care Physician: Marcus Mallick, MA [place] Other Clinician: Referring Physician: Treating Physician/Extender: Hal Morales, MA [name] Weeks in Treatment: 5 Education Assessment Education Provided To: Patient Education Topics Provided Wound/Skin Impairment: Methods: Explain/Verbal Responses: State content correctly Electronic Signature(s) Signed: 12/15/2020 7:00:57 PM By: Levan Hurst RN, BSN Entered By: Levan Hurst on 12/15/2020 08:39:06 -------------------------------------------------------------------------------- Wound Assessment Details Patient Name: Date of Service: Marcus Army Fossa T. 12/15/2020 8:45 A M Medical Record Number: [id number] Patient Account Number: [id number]  Date of Birth/Sex: Treating RN: 01/23/1951 (69 y.o. [name] Primary Care Jannely Henthorn: Marcus Mallick, MA [name] Other Clinician: Referring Blondie Riggsbee: Treating Giara Mcgaughey/Extender: Hal Morales, MA [name] Weeks in Treatment: 5 Wound Status Wound Number: 1 Primary Venous Leg Ulcer Etiology: Wound Location: Right, Anterior Lower Leg Wound Open Wounding Event: Trauma Status: Date Acquired: 09/24/2020 Comorbid Cataracts, Arrhythmia, Coronary Artery Disease, Hypertension, Weeks Of Treatment: 5 History: Myocardial Infarction, Peripheral Arterial Disease, Peripheral Clustered Wound: No Clustered Wound: No Venous Disease, Type II Diabetes, Osteoarthritis, Neuropathy, Received Radiation Wound Measurements Length: (cm) 2.3 Width: (cm) 0.7 Depth: (cm) 0.1 Area: (cm) 1.264 Volume: (cm) 0.126 % Reduction in Area: 46.3% %  Reduction in Volume: 73.2% Epithelialization: Small (1-33%) Tunneling: No Undermining: No Wound Description Classification: Full Thickness Without Exposed Support Structures Wound Margin: Flat and Intact Exudate Amount: Medium Exudate Type: Serosanguineous Exudate Color: red, brown Foul Odor After Cleansing: No Slough/Fibrino Yes Wound Bed Granulation Amount: Large (67-100%) Exposed Structure Granulation Quality: Red Fascia Exposed: No Necrotic Amount: Small (1-33%) Fat Layer (Subcutaneous Tissue) Exposed: Yes Necrotic Quality: Adherent Slough Tendon Exposed: No Muscle Exposed: No Joint Exposed: No Bone Exposed: No Treatment Notes Wound #1 (Lower Leg) Wound Laterality: Right, Anterior Cleanser Peri-Wound Care Sween Lotion (Moisturizing lotion) Discharge Instruction: Apply moisturizing lotion as directed Topical Primary Dressing Hydrofera Blue Classic Foam, 2x2 in Discharge Instruction: Moisten with saline prior to applying to wound bed Secondary Dressing Woven Gauze Sponge, Non-Sterile 4x4 in Discharge Instruction: Apply over primary dressing as directed. Secured With Compression Wrap Kerlix Roll 4.5x3.1 (in/yd) Discharge Instruction: Apply Kerlix and Coban compression as directed. Coban Self-Adherent Wrap 4x5 (in/yd) Discharge Instruction: Apply over Kerlix as directed. Compression Stockings Add-Ons Electronic Signature(s) Signed: 12/15/2020 5:18:14 PM By: Deon Pilling Entered By: Deon Pilling on 12/15/2020 08:43:54 -------------------------------------------------------------------------------- Wound Assessment Details Patient Name: Date of Service: [name] 12/15/2020 8:45 A M Medical Record Number: [id number] Patient Account Number: [id number] Date of Birth/Sex: Treating RN: [birth year] (69 y.o. [name] Primary Care Lashawn Bromwell: Marcus Mallick, MA [name] Other Clinician: Referring Rayleen Wyrick: Treating Kylieann Eagles/Extender: Hal Morales,  MA [name] Weeks in Treatment: 5 Wound Status Wound Number: 2 Primary Venous Leg Ulcer Etiology: Wound Location: Left, Anterior Lower Leg Wound Open Wounding Event: Trauma Status: Date Acquired: 09/24/2020 Comorbid Cataracts, Arrhythmia, Coronary Artery Disease, Hypertension, Weeks Of Treatment: 5 History: Myocardial Infarction, Peripheral Arterial Disease, Peripheral Clustered Wound: No Venous Disease, Type II Diabetes, Osteoarthritis, Neuropathy, Received Radiation Wound Measurements Length: (cm) 1.9 Width: (cm) 1.7 Depth: (cm) 0.1 Area: (cm) 2.537 Volume: (cm) 0.254 % Reduction in Area: 32.7% % Reduction in Volume: 66.3% Epithelialization: Medium (34-66%) Tunneling: No Undermining: No Wound Description Classification: Full Thickness Without Exposed Support Structures Wound Margin: Flat and Intact Exudate Amount: Medium Exudate Type: Serosanguineous Exudate Color: red, brown Foul Odor After Cleansing: No Slough/Fibrino Yes Wound Bed Granulation Amount: Large (67-100%) Exposed Structure Granulation Quality: Red Fascia Exposed: No Necrotic Amount: Small (1-33%) Fat Layer (Subcutaneous Tissue) Exposed: Yes Necrotic Quality: Adherent Slough Tendon Exposed: No Muscle Exposed: No Joint Exposed: No Bone Exposed: No Treatment Notes Wound #2 (Lower Leg) Wound Laterality: Left, Anterior Cleanser Peri-Wound Care Sween Lotion (Moisturizing lotion) Discharge Instruction: Apply moisturizing lotion as directed Topical Primary Dressing Hydrofera Blue Classic Foam, 2x2 in Discharge Instruction: Moisten with saline prior to applying to wound bed Secondary Dressing Woven Gauze Sponge, Non-Sterile 4x4 in Discharge Instruction: Apply over primary dressing as directed. Secured With Compression Wrap Kerlix Roll  4.5x3.1 (in/yd) Discharge Instruction: Apply Kerlix and Coban compression as directed. Coban Self-Adherent Wrap 4x5 (in/yd) Discharge Instruction: Apply over Kerlix as  directed. Compression Stockings Add-Ons Electronic Signature(s) Signed: 12/15/2020 5:18:14 PM By: Deon Pilling Entered By: Deon Pilling on 12/15/2020 08:44:19 -------------------------------------------------------------------------------- Vitals Details Patient Name: Date of Service: [name], Elder Marcus T. 12/15/2020 8:45 A M Medical Record Number: [id number] Patient Account Number: [id number] Date of Birth/Sex: Treating RN: 1951/04/04 (69 y.o. [name] Primary Care Sanari Offner: Marcus Mallick, MA [name] Other Clinician: Referring Latesia Norrington: Treating Danniella Robben/Extender: Hal Morales, MA [name] Weeks in Treatment: 5 Vital Signs Time Taken: 08:35 Temperature (F): 97.9 Height (in): 71 Pulse (bpm): 87 Weight (lbs): 225 Respiratory Rate (breaths/min): 16 Body Mass Index (BMI): 31.4 Blood Pressure (mmHg): 162/90 Reference Range: 80 - 120 mg / dl Electronic Signature(s) Signed: 12/15/2020 5:18:14 PM By: Deon Pilling Entered By: Deon Pilling on 12/15/2020 08:42:53

## 2020-12-15 NOTE — Progress Notes (Signed)
MUHSIN, BAIRES ([id number]) . Visit Report for 12/15/2020 Chief Complaint Document Details Patient Name: Date of Service: [name]. 12/15/2020 8:45 A M Medical Record Number: [id number] Patient Account Number: [id number] Date of Birth/Sex: Treating RN: 1951/03/28 (69 y.o. [name] Primary Care Provider: [name], MA [name] Other Clinician: Referring Provider: Treating Provider/Extender: Marcus Morales, MA [name] Weeks in Treatment: 5 Information Obtained from: Patient Chief Complaint Bilateral LE Ulcers Electronic Signature(s) Signed: 12/15/2020 8:51:01 AM By: Marcus Keeler PA-C Entered By: [name] on 12/15/2020 08:51:01 -------------------------------------------------------------------------------- HPI Details Patient Name: Date of Service: [name], Marcus [name] 12/15/2020 8:45 A M Medical Record Number: [id number] Patient Account Number: [id number] Date of Birth/Sex: Treating RN: 1951-07-10 (69 y.o. [name] Primary Care Provider: [name], MA [name] Other Clinician: Referring Provider: Treating Provider/Extender: Marcus Morales, MA [name] Weeks in Treatment: 5 History of Present Illness HPI Description: 11/10/2020 patient presents today for initial evaluation in this clinic although I have seen this patient in [place] previous. Subsequently his issue today is different from when I saw him for I was treating him for a toe ulcer. Currently he is having issues with bilateral lower extremity ulcers after running into a metal bar. Upon inspection today he has wounds of the bilateral lower extremities which are consistent with having struck his legs on the anterior aspect. With that being said the patient did go to the ER for evaluation on October 11 where he was given Keflex initially. He went back to the ER after not improving on October 20 he was given Lasix at that point he states the Lasix seem to help the most. Has been  using Neosporin and leaving this open area which is probably not the best way to go either to be honest. He has had arterial studies on March 2021 which showed a left ABI of 0.78 with a TBI of 0.34 and a right ABI of 1.18 with a TBI 1.18. Nonetheless his arterial flow the left is not ideal but also I think potentially consistent with allowing this to heal. I think he can also support light compression with Kerlix and Coban based on this result. His most recent hemoglobin A1c that we could find was 7.3 and that was towards the end of 2020. The patient does have a history of heart disease unfortunately. He is also currently still a smoker. 11/24/2020 on evaluation today patient appears to be doing well with regard to his wounds. In fact both appear to be doing better after last week's rather last visit's debridement. Fortunately there is no signs of active infection at this time. No fevers, chills, nausea, vomiting, or diarrhea. Overall I am very pleased with where things stand today. 12/01/2020 on evaluation today patient appears to be doing well with regard to his leg ulcers. He has been tolerating the dressing changes without complication. Fortunately there is no signs of active infection at this time. No fevers, chills, nausea, vomiting, or diarrhea. 12/08/2020 on evaluation today patient appears to be doing very well in regard to his bilateral lower extremity ulcers. They seem to be doing excellent and he is making great progress. There does not appear to be any evidence of active infection at this time. No fevers, chills, nausea, vomiting, or diarrhea. 12/15/2020 upon evaluation today patient actually appears to be doing excellent in regard to his wounds. Has been tolerating the dressing changes without complication.  Fortunately his wounds are showing signs of great improvement were using Hydrofera Blue. Electronic Signature(s) Signed: 12/15/2020 9:22:07 AM By: Marcus Keeler PA-C Entered By:  [name] on 12/15/2020 09:22:07 -------------------------------------------------------------------------------- Physical Exam Details Patient Name: Date of Service: [name], Marcus M T. 12/15/2020 8:45 A M Medical Record Number: [id number] Patient Account Number: [id number] Date of Birth/Sex: Treating RN: 1951/03/31 (69 y.o. [name] Primary Care Provider: [name], MA [name] Other Clinician: Referring Provider: Treating Provider/Extender: Marcus Morales, MA [name] Weeks in Treatment: 5 Constitutional Well-nourished and well-hydrated in no acute distress. Respiratory normal breathing without difficulty. Psychiatric this patient is able to make decisions and demonstrates good insight into disease process. Alert and Oriented x 3. pleasant and cooperative. Notes His wound bed actually showed signs of good granulation epithelization at this point. There does not appear to be any evidence of active infection which is great news and overall I am extremely pleased with where things stand. No fevers, chills, nausea, vomiting, or diarrhea. Electronic Signature(s) Signed: 12/15/2020 9:22:40 AM By: Marcus Keeler PA-C Entered By: [name] on 12/15/2020 09:22:39 -------------------------------------------------------------------------------- Physician Orders Details Patient Name: Date of Service: [name], Marcus [name] 12/15/2020 8:45 A M Medical Record Number: [id number] Patient Account Number: [id number] Date of Birth/Sex: Treating RN: [birth year] (69 y.o. [name] Primary Care Provider: [name], MA [name] Other Clinician: Referring Provider: Treating Provider/Extender: Marcus Morales, MA [name] Weeks in Treatment: 5 Verbal / Phone Orders: No Diagnosis Coding ICD-10 Coding Code Description E11.622 Type 2 diabetes mellitus with other skin ulcer I89.0 Lymphedema, not elsewhere classified I87.2 Venous insufficiency (chronic)  (peripheral) L97.812 Non-pressure chronic ulcer of other part of right lower leg with fat layer exposed L97.822 Non-pressure chronic ulcer of other part of left lower leg with fat layer exposed I25.10 Atherosclerotic heart disease of native coronary artery without angina pectoris Follow-up Appointments ppointment in 2 weeks. - MD visit Return A Nurse Visit: - 1 week for rewrap Bathing/ Shower/ Hygiene May shower with protection but do not get wound dressing(s) wet. Edema Control - Lymphedema / SCD / Other Bilateral Lower Extremities Elevate legs to the level of the heart or above for 30 minutes daily and/or when sitting, a frequency of: - throughout the day Avoid standing for long periods of time. Exercise regularly Wound Treatment Wound #1 - Lower Leg Wound Laterality: Right, Anterior Peri-Wound Care: Sween Lotion (Moisturizing lotion) 1 x Per Week Discharge Instructions: Apply moisturizing lotion as directed Prim Dressing: Hydrofera Blue Classic Foam, 2x2 in 1 x Per Week ary Discharge Instructions: Moisten with saline prior to applying to wound bed Secondary Dressing: Woven Gauze Sponge, Non-Sterile 4x4 in 1 x Per Week Discharge Instructions: Apply over primary dressing as directed. Compression Wrap: Kerlix Roll 4.5x3.1 (in/yd) 1 x Per Week Discharge Instructions: Apply Kerlix and Coban compression as directed. Compression Wrap: Coban Self-Adherent Wrap 4x5 (in/yd) 1 x Per Week Discharge Instructions: Apply over Kerlix as directed. Wound #2 - Lower Leg Wound Laterality: Left, Anterior Peri-Wound Care: Sween Lotion (Moisturizing lotion) 1 x Per Week Discharge Instructions: Apply moisturizing lotion as directed Prim Dressing: Hydrofera Blue Classic Foam, 2x2 in 1 x Per Week ary Discharge Instructions: Moisten with saline prior to applying to wound bed Secondary Dressing: Woven Gauze Sponge, Non-Sterile 4x4 in 1 x Per Week Discharge Instructions: Apply over primary dressing as  directed. Compression Wrap: Kerlix Roll 4.5x3.1 (in/yd) 1 x Per Week  Discharge Instructions: Apply Kerlix and Coban compression as directed. Compression Wrap: Coban Self-Adherent Wrap 4x5 (in/yd) 1 x Per Week Discharge Instructions: Apply over Kerlix as directed. Electronic Signature(s) Signed: 12/15/2020 5:13:59 PM By: Marcus Keeler PA-C Signed: 12/15/2020 7:00:57 PM By: Levan Hurst RN, BSN Entered By: Levan Hurst on 12/15/2020 09:17:01 -------------------------------------------------------------------------------- Problem List Details Patient Name: Date of Service: [name], Marcus [name] 12/15/2020 8:45 A M Medical Record Number: [id number] Patient Account Number: [id number] Date of Birth/Sex: Treating RN: [birth year] (69 y.o. [name] Primary Care Provider: [name], MA [name] Other Clinician: Referring Provider: Treating Provider/Extender: Marcus Morales, MA [name] Weeks in Treatment: 5 Active Problems ICD-10 Encounter Code Description Active Date MDM Diagnosis E11.622 Type 2 diabetes mellitus with other skin ulcer 11/10/2020 No Yes I89.0 Lymphedema, not elsewhere classified 11/10/2020 No Yes I87.2 Venous insufficiency (chronic) (peripheral) 11/10/2020 No Yes L97.812 Non-pressure chronic ulcer of other part of right lower leg with fat layer 11/10/2020 No Yes exposed L97.822 Non-pressure chronic ulcer of other part of left lower leg with fat layer exposed11/17/2021 No Yes I25.10 Atherosclerotic heart disease of native coronary artery without angina pectoris 11/10/2020 No Yes Inactive Problems Resolved Problems Electronic Signature(s) Signed: 12/15/2020 8:50:42 AM By: Marcus Keeler PA-C Entered By: [name] on 12/15/2020 08:50:42 -------------------------------------------------------------------------------- Progress Note Details Patient Name: Date of Service: [name], Marcus [name] 12/15/2020 8:45 A M Medical Record Number:  [id number] Patient Account Number: [id number] Date of Birth/Sex: Treating RN: 11-Oct-1951 (69 y.o. [name] Primary Care Provider: [name], MA [name] Other Clinician: Referring Provider: Treating Provider/Extender: Marcus Morales, MA [name] Weeks in Treatment: 5 Subjective Chief Complaint Information obtained from Patient Bilateral LE Ulcers History of Present Illness (HPI) 11/10/2020 patient presents today for initial evaluation in this clinic although I have seen this patient in [place] previous. Subsequently his issue today is different from when I saw him for I was treating him for a toe ulcer. Currently he is having issues with bilateral lower extremity ulcers after running into a metal bar. Upon inspection today he has wounds of the bilateral lower extremities which are consistent with having struck his legs on the anterior aspect. With that being said the patient did go to the ER for evaluation on October 11 where he was given Keflex initially. He went back to the ER after not improving on October 20 he was given Lasix at that point he states the Lasix seem to help the most. Has been using Neosporin and leaving this open area which is probably not the best way to go either to be honest. He has had arterial studies on March 2021 which showed a left ABI of 0.78 with a TBI of 0.34 and a right ABI of 1.18 with a TBI 1.18. Nonetheless his arterial flow the left is not ideal but also I think potentially consistent with allowing this to heal. I think he can also support light compression with Kerlix and Coban based on this result. His most recent hemoglobin A1c that we could find was 7.3 and that was towards the end of 2020. The patient does have a history of heart disease unfortunately. He is also currently still a smoker. 11/24/2020 on evaluation today patient appears to be doing well with regard to his wounds. In fact both appear to be doing better after last week's rather  last visit's debridement. Fortunately there is no signs of active infection at  this time. No fevers, chills, nausea, vomiting, or diarrhea. Overall I am very pleased with where things stand today. 12/01/2020 on evaluation today patient appears to be doing well with regard to his leg ulcers. He has been tolerating the dressing changes without complication. Fortunately there is no signs of active infection at this time. No fevers, chills, nausea, vomiting, or diarrhea. 12/08/2020 on evaluation today patient appears to be doing very well in regard to his bilateral lower extremity ulcers. They seem to be doing excellent and he is making great progress. There does not appear to be any evidence of active infection at this time. No fevers, chills, nausea, vomiting, or diarrhea. 12/15/2020 upon evaluation today patient actually appears to be doing excellent in regard to his wounds. Has been tolerating the dressing changes without complication. Fortunately his wounds are showing signs of great improvement were using Hydrofera Blue. Objective Constitutional Well-nourished and well-hydrated in no acute distress. Vitals Time Taken: 8:35 AM, Height: 71 in, Weight: 225 lbs, BMI: 31.4, Temperature: 97.9 F, Pulse: 87 bpm, Respiratory Rate: 16 breaths/min, Blood Pressure: 162/90 mmHg. Respiratory normal breathing without difficulty. Psychiatric this patient is able to make decisions and demonstrates good insight into disease process. Alert and Oriented x 3. pleasant and cooperative. General Notes: His wound bed actually showed signs of good granulation epithelization at this point. There does not appear to be any evidence of active infection which is great news and overall I am extremely pleased with where things stand. No fevers, chills, nausea, vomiting, or diarrhea. Integumentary (Hair, Skin) Wound #1 status is Open. Original cause of wound was Trauma. The wound is located on the Right,Anterior Lower Leg.  The wound measures 2.3cm length x 0.7cm width x 0.1cm depth; 1.264cm^2 area and 0.126cm^3 volume. There is Fat Layer (Subcutaneous Tissue) exposed. There is no tunneling or undermining noted. There is a medium amount of serosanguineous drainage noted. The wound margin is flat and intact. There is large (67-100%) red granulation within the wound bed. There is a small (1-33%) amount of necrotic tissue within the wound bed including Adherent Slough. Wound #2 status is Open. Original cause of wound was Trauma. The wound is located on the Left,Anterior Lower Leg. The wound measures 1.9cm length x 1.7cm width x 0.1cm depth; 2.537cm^2 area and 0.254cm^3 volume. There is Fat Layer (Subcutaneous Tissue) exposed. There is no tunneling or undermining noted. There is a medium amount of serosanguineous drainage noted. The wound margin is flat and intact. There is large (67-100%) red granulation within the wound bed. There is a small (1-33%) amount of necrotic tissue within the wound bed including Adherent Slough. Assessment Active Problems ICD-10 Type 2 diabetes mellitus with other skin ulcer Lymphedema, not elsewhere classified Venous insufficiency (chronic) (peripheral) Non-pressure chronic ulcer of other part of right lower leg with fat layer exposed Non-pressure chronic ulcer of other part of left lower leg with fat layer exposed Atherosclerotic heart disease of native coronary artery without angina pectoris Plan Follow-up Appointments: Return Appointment in 2 weeks. - MD visit Nurse Visit: - 1 week for rewrap Bathing/ Shower/ Hygiene: May shower with protection but do not get wound dressing(s) wet. Edema Control - Lymphedema / SCD / Other: Elevate legs to the level of the heart or above for 30 minutes daily and/or when sitting, a frequency of: - throughout the day Avoid standing for long periods of time. Exercise regularly WOUND #1: - Lower Leg Wound Laterality: Right, Anterior Peri-Wound Care:  Sween Lotion (Moisturizing lotion) 1 x Per  Week/ Discharge Instructions: Apply moisturizing lotion as directed Prim Dressing: Hydrofera Blue Classic Foam, 2x2 in 1 x Per Week/ ary Discharge Instructions: Moisten with saline prior to applying to wound bed Secondary Dressing: Woven Gauze Sponge, Non-Sterile 4x4 in 1 x Per Week/ Discharge Instructions: Apply over primary dressing as directed. Com pression Wrap: Kerlix Roll 4.5x3.1 (in/yd) 1 x Per Week/ Discharge Instructions: Apply Kerlix and Coban compression as directed. Com pression Wrap: Coban Self-Adherent Wrap 4x5 (in/yd) 1 x Per Week/ Discharge Instructions: Apply over Kerlix as directed. WOUND #2: - Lower Leg Wound Laterality: Left, Anterior Peri-Wound Care: Sween Lotion (Moisturizing lotion) 1 x Per Week/ Discharge Instructions: Apply moisturizing lotion as directed Prim Dressing: Hydrofera Blue Classic Foam, 2x2 in 1 x Per Week/ ary Discharge Instructions: Moisten with saline prior to applying to wound bed Secondary Dressing: Woven Gauze Sponge, Non-Sterile 4x4 in 1 x Per Week/ Discharge Instructions: Apply over primary dressing as directed. Com pression Wrap: Kerlix Roll 4.5x3.1 (in/yd) 1 x Per Week/ Discharge Instructions: Apply Kerlix and Coban compression as directed. Com pression Wrap: Coban Self-Adherent Wrap 4x5 (in/yd) 1 x Per Week/ Discharge Instructions: Apply over Kerlix as directed. 1. Would recommend currently that we going continue with the [facility] I feel like this is doing well for the patient I see no reason to make any changes here he is in agreement with that plan. 2. I am also can recommend currently that we have the patient continue to elevate his legs much as possible. I do think the Curlex and Coban wrap is doing a good job at least well enough for him at this time. 3. I am also can recommend that we continue to monitor for any signs of worsening if anything changes he should let me know. We will see  patient back for reevaluation in 2 weeks here in the clinic. If anything worsens or changes patient will contact our office for additional recommendations. We will see him in 1 week for nurse visit wrap change. Electronic Signature(s) Signed: 12/15/2020 9:23:10 AM By: Marcus Keeler PA-C Entered By: [name] on 12/15/2020 09:23:10 -------------------------------------------------------------------------------- SuperBill Details Patient Name: Date of Service: [name], Marcus [name] 12/15/2020 Medical Record Number: [id number] Patient Account Number: [id number] Date of Birth/Sex: Treating RN: [birth year] (69 y.o. [name] Primary Care Provider: [name], MA [name] Other Clinician: Referring Provider: Treating Provider/Extender: Marcus Morales, MA [name] Weeks in Treatment: 5 Diagnosis Coding ICD-10 Codes Code Description E11.622 Type 2 diabetes mellitus with other skin ulcer I89.0 Lymphedema, not elsewhere classified I87.2 Venous insufficiency (chronic) (peripheral) L97.812 Non-pressure chronic ulcer of other part of right lower leg with fat layer exposed L97.822 Non-pressure chronic ulcer of other part of left lower leg with fat layer exposed I25.10 Atherosclerotic heart disease of native coronary artery without angina pectoris Facility Procedures CPT4 Code: [id number] Description: 99214 - WOUND CARE VISIT-LEV 4 EST PT Modifier: Quantity: 1 Physician Procedures : CPT4 Code Description Modifier [id number] - WC PHYS LEVEL 3 - EST PT ICD-10 Diagnosis Description E11.622 Type 2 diabetes mellitus with other skin ulcer I89.0 Lymphedema, not elsewhere classified I87.2 Venous insufficiency (chronic) (peripheral)  L97.812 Non-pressure chronic ulcer of other part of right lower leg with fat layer exposed Quantity: 1 Electronic Signature(s) Signed: 12/15/2020 9:23:21 AM By: Marcus Keeler PA-C Entered By: [name] on 12/15/2020 09:23:20

## 2020-12-21 ENCOUNTER — Other Ambulatory Visit: Payer: Self-pay | Admitting: Adult Health

## 2020-12-22 ENCOUNTER — Encounter (HOSPITAL_BASED_OUTPATIENT_CLINIC_OR_DEPARTMENT_OTHER): Payer: PPO | Admitting: Physician Assistant

## 2020-12-22 ENCOUNTER — Other Ambulatory Visit: Payer: Self-pay

## 2020-12-22 DIAGNOSIS — E11622 Type 2 diabetes mellitus with other skin ulcer: Secondary | ICD-10-CM | POA: Diagnosis not present

## 2020-12-27 NOTE — Progress Notes (Signed)
Marcus, [name] ([id number]) . Visit Report for 12/22/2020 Arrival Information Details Patient Name: Date of Service: [name]. 12/22/2020 8:30 A M Medical Record Number: [id number] Patient Account Number: [id number] Date of Birth/Sex: Treating RN: 1951-04-29 (70 y.o. [name] Primary Care Josedejesus Marcum: Mayer Masker Other Clinician: Referring Timmya Blazier: Treating Eaven Schwager/Extender: Marella Chimes in Treatment: 6 Visit Information History Since Last Visit Added or deleted any medications: No Patient Arrived: Ambulatory Any new allergies or adverse reactions: No Arrival Time: 08:24 Had a fall or experienced change in No Accompanied By: self activities of daily living that may affect Transfer Assistance: None risk of falls: Patient Identification Verified: Yes Signs or symptoms of abuse/neglect since last visito No Secondary Verification Process Completed: Yes Hospitalized since last visit: No Patient Requires Transmission-Based Precautions: No Implantable device outside of the clinic excluding No Patient Has Alerts: Yes cellular tissue based products placed in the center Patient Alerts: Patient on Blood Thinner since last visit: L ABI: 0.78 TBI: 0.34 Has Dressing in Place as Prescribed: Yes R ABI: 1.18 TBI: 1.18 Pain Present Now: Yes Electronic Signature(s) Signed: 12/22/2020 10:14:31 AM By: Karl Ito Entered By: Karl Ito on 12/22/2020 08:25:26 -------------------------------------------------------------------------------- Clinic Level of Care Assessment Details Patient Name: Date of Service: [name] 12/22/2020 8:30 A M Medical Record Number: [id number] Patient Account Number: [id number] Date of Birth/Sex: Treating RN: 1951-01-18 (70 y.o. [name] Primary Care Hanifa Antonetti: Mayer Masker Other Clinician: Referring Ahmoni Edge: Treating Tanijah Morais/Extender: Marella Chimes in  Treatment: 6 Clinic Level of Care Assessment Items TOOL 4 Quantity Score X- 1 0 Use when only an EandM is performed on FOLLOW-UP visit ASSESSMENTS - Nursing Assessment / Reassessment X- 1 10 Reassessment of Co-morbidities (includes updates in patient status) X- 1 5 Reassessment of Adherence to Treatment Plan ASSESSMENTS - Wound and Skin A ssessment / Reassessment []  - 0 Simple Wound Assessment / Reassessment - one wound X- 2 5 Complex Wound Assessment / Reassessment - multiple wounds X- 1 10 Dermatologic / Skin Assessment (not related to wound area) ASSESSMENTS - Focused Assessment X- 2 5 Circumferential Edema Measurements - multi extremities X- 1 10 Nutritional Assessment / Counseling / Intervention []  - 0 Lower Extremity Assessment (monofilament, tuning fork, pulses) []  - 0 Peripheral Arterial Disease Assessment (using hand held doppler) ASSESSMENTS - Ostomy and/or Continence Assessment and Care []  - 0 Incontinence Assessment and Management []  - 0 Ostomy Care Assessment and Management (repouching, etc.) PROCESS - Coordination of Care []  - 0 Simple Patient / Family Education for ongoing care X- 1 20 Complex (extensive) Patient / Family Education for ongoing care X- 1 10 Staff obtains , Records, T Results / Process Orders est []  - 0 Staff telephones HHA, Nursing Homes / Clarify orders / etc []  - 0 Routine Transfer to another Facility (non-emergent condition) []  - 0 Routine Hospital Admission (non-emergent condition) []  - 0 New Admissions / / Ordering NPWT Apligraf, etc. , []  - 0 Emergency Hospital Admission (emergent condition) []  - 0 Simple Discharge Coordination X- 1 15 Complex (extensive) Discharge Coordination PROCESS - Special Needs []  - 0 Pediatric / Minor Patient Management []  - 0 Isolation Patient Management []  - 0 Hearing / Language / Visual special needs []  - 0 Assessment of Community assistance (transportation,  D/C planning, etc.) []  - 0 Additional assistance / Altered mentation []  - 0 Support Surface(s) Assessment (bed, cushion, seat, etc.) INTERVENTIONS - Wound Cleansing /  Measurement []  - 0 Simple Wound Cleansing - one wound X- 2 5 Complex Wound Cleansing - multiple wounds X- 1 5 Wound Imaging (photographs - any number of wounds) []  - 0 Wound Tracing (instead of photographs) []  - 0 Simple Wound Measurement - one wound X- 2 5 Complex Wound Measurement - multiple wounds INTERVENTIONS - Wound Dressings []  - 0 Small Wound Dressing one or multiple wounds []  - 0 Medium Wound Dressing one or multiple wounds X- 2 20 Large Wound Dressing one or multiple wounds []  - 0 Application of Medications - topical []  - 0 Application of Medications - injection INTERVENTIONS - Miscellaneous []  - 0 External ear exam []  - 0 Specimen Collection (cultures, biopsies, blood, body fluids, etc.) []  - 0 Specimen(s) / Culture(s) sent or taken to Lab for analysis []  - 0 Patient Transfer (multiple staff / Civil Service fast streamer / Similar devices) []  - 0 Simple Staple / Suture removal (25 or less) []  - 0 Complex Staple / Suture removal (26 or more) []  - 0 Hypo / Hyperglycemic Management (close monitor of Blood Glucose) []  - 0 Ankle / Brachial Index (ABI) - do not check if billed separately X- 1 5 Vital Signs Has the patient been seen at the hospital within the last three years: Yes Total Score: 170 Level Of Care: New/Established - Level 5 Electronic Signature(s) Signed: 12/22/2020 2:08:48 PM By: Deon Pilling Entered By: Deon Pilling on 12/22/2020 08:56:13 -------------------------------------------------------------------------------- Encounter Discharge Information Details Patient Name: Date of Service: Marcus Ladonna [name], [name] 12/22/2020 8:30 A M Medical Record Number: [id number] Patient Account Number: [id number] Date of Birth/Sex: Treating RN: 05-May-1951 (70 y.o. [name] Primary Care Legrande Hao:  [name] Other Clinician: Referring Issabela Lesko: Treating Carmelite Violet/Extender: [name] in Treatment: 6 Encounter Discharge Information Items Discharge Condition: Stable Ambulatory Status: Ambulatory Discharge Destination: Home Transportation: Private Auto Accompanied By: self Schedule Follow-up Appointment: Yes Clinical Summary of Care: Electronic Signature(s) Signed: 12/22/2020 2:08:48 PM By: Deon Pilling Entered By: Deon Pilling on 12/22/2020 08:55:24 -------------------------------------------------------------------------------- Patient/Caregiver Education Details Patient Name: Date of Service: Marcus Murlean Hark 12/29/2021andnbsp8:30 [place] Record Number: [id number] Patient Account Number: [id number] Date of Birth/Gender: Treating RN: Dec 28, 1950 (70 y.o. [name] Primary Care Physician: [name] Other Clinician: Referring Physician: Treating Physician/Extender: [name] in Treatment: 6 Education Assessment Education Provided To: Patient Education Topics Provided Elevated Blood Sugar/ Impact on Healing: Handouts: Elevated Blood Sugars: How Do They Affect Wound Healing Methods: Explain/Verbal Responses: Reinforcements needed Electronic Signature(s) Signed: 12/22/2020 2:08:48 PM By: Deon Pilling Entered By: Deon Pilling on 12/22/2020 08:55:12 -------------------------------------------------------------------------------- Wound Assessment Details Patient Name: Date of Service: [name], [name] 12/22/2020 8:30 A M Medical Record Number: [id number] Patient Account Number: [id number] Date of Birth/Sex: Treating RN: [birth year] (70 y.o. [name] Primary Care Keva Darty: [name] Other Clinician: Referring Prestin Munch: Treating Emalynn Clewis/Extender: [name] in Treatment: 6 Wound Status Wound Number: 1 Primary Etiology: Venous Leg  Ulcer Wound Location: Right, Anterior Lower Leg Wound Status: Open Wounding Event: Trauma Date Acquired: 09/24/2020 Weeks Of Treatment: 6 Clustered Wound: No Wound Measurements Length: (cm) 2.3 Width: (cm) 0.7 Depth: (cm) 0.1 Area: (cm) 1.264 Volume: (cm) 0.126 % Reduction in Area: 46.3% % Reduction in Volume: 73.2% Wound Description Classification: Full Thickness Without Exposed Support Structur es Treatment Notes Wound #1 (Lower Leg) Wound Laterality: Right, Anterior Cleanser Peri-Wound Care Sween Lotion (Moisturizing lotion) Discharge Instruction: Apply  moisturizing lotion as directed Topical Primary Dressing Hydrofera Blue Classic Foam, 2x2 in Discharge Instruction: Moisten with saline prior to applying to wound bed Secondary Dressing Woven Gauze Sponge, Non-Sterile 4x4 in Discharge Instruction: Apply over primary dressing as directed. Secured With Compression Wrap Kerlix Roll 4.5x3.1 (in/yd) Discharge Instruction: Apply Kerlix and Coban compression as directed. Coban Self-Adherent Wrap 4x5 (in/yd) Discharge Instruction: Apply over Kerlix as directed. Compression Stockings Add-Ons Electronic Signature(s) Signed: 12/22/2020 10:14:31 AM By: Sandre Kitty Signed: 12/27/2020 2:31:34 PM By: Baruch Gouty RN, BSN Entered By: Sandre Kitty on 12/22/2020 08:26:07 -------------------------------------------------------------------------------- Wound Assessment Details Patient Name: Date of Service: [name], [name] 12/22/2020 8:30 A M Medical Record Number: [id number] Patient Account Number: [id number] Date of Birth/Sex: Treating RN: August 21, 1951 (70 y.o. [name] Primary Care Rajni Holsworth: [name] Other Clinician: Referring Corlene Sabia: Treating Yoav Okane/Extender: [name] in Treatment: 6 Wound Status Wound Number: 2 Primary Etiology: Venous Leg Ulcer Wound Location: Left, Anterior Lower Leg Wound Status:  Open Wounding Event: Trauma Date Acquired: 09/24/2020 Weeks Of Treatment: 6 Clustered Wound: No Wound Measurements Length: (cm) 1.9 Width: (cm) 1.7 Depth: (cm) 0.1 Area: (cm) 2.537 Volume: (cm) 0.254 % Reduction in Area: 32.7% % Reduction in Volume: 66.3% Wound Description Classification: Full Thickness Without Exposed Support Structur es Treatment Notes Wound #2 (Lower Leg) Wound Laterality: Left, Anterior Cleanser Peri-Wound Care Sween Lotion (Moisturizing lotion) Discharge Instruction: Apply moisturizing lotion as directed Topical Primary Dressing Hydrofera Blue Classic Foam, 2x2 in Discharge Instruction: Moisten with saline prior to applying to wound bed Secondary Dressing Woven Gauze Sponge, Non-Sterile 4x4 in Discharge Instruction: Apply over primary dressing as directed. Secured With Compression Wrap Kerlix Roll 4.5x3.1 (in/yd) Discharge Instruction: Apply Kerlix and Coban compression as directed. Coban Self-Adherent Wrap 4x5 (in/yd) Discharge Instruction: Apply over Kerlix as directed. Compression Stockings Add-Ons Electronic Signature(s) Signed: 12/22/2020 10:14:31 AM By: Sandre Kitty Signed: 12/27/2020 2:31:34 PM By: Baruch Gouty RN, BSN Entered By: Sandre Kitty on 12/22/2020 08:26:07 -------------------------------------------------------------------------------- Vitals Details Patient Name: Date of Service: Marcus Ladonna [name], [name] 12/22/2020 8:30 A M Medical Record Number: [id number] Patient Account Number: [id number] Date of Birth/Sex: Treating RN: 03-13-1951 (70 y.o. [name] Primary Care Mazikeen Hehn: [name] Other Clinician: Referring Kamauri Denardo: Treating Braelynn Lupton/Extender: [name] in Treatment: 6 Vital Signs Time Taken: 08:25 Temperature (F): 99.0 Height (in): 71 Pulse (bpm): 106 Weight (lbs): 225 Respiratory Rate (breaths/min): 16 Body Mass Index (BMI): 31.4 Blood Pressure (mmHg):  144/79 Reference Range: 80 - 120 mg / dl Electronic Signature(s) Signed: 12/22/2020 10:14:31 AM By: Sandre Kitty Entered By: Sandre Kitty on 12/22/2020 08:25:48

## 2020-12-28 NOTE — Progress Notes (Signed)
MURLE, OTTING ([id number]) . Visit Report for 12/22/2020 SuperBill Details Patient Name: Date of Service: Juanetta Gosling. 12/22/2020 Medical Record Number: [id number] Patient Account Number: [id number] Date of Birth/Sex: Treating RN: [birth year] (70 y.o. Harlon Flor, Millard.Loa Primary Care Provider: Mayer Masker Other Clinician: Referring Provider: Treating Provider/Extender: Ralph Leyden Weeks in Treatment: 6 Diagnosis Coding ICD-10 Codes Code Description [id number] Type 2 diabetes mellitus with other skin ulcer I89.0 Lymphedema, not elsewhere classified I87.2 Venous insufficiency (chronic) (peripheral) L97.812 Non-pressure chronic ulcer of other part of right lower leg with fat layer exposed L97.822 Non-pressure chronic ulcer of other part of left lower leg with fat layer exposed I25.10 Atherosclerotic heart disease of native coronary artery without angina pectoris Facility Procedures CPT4 Code Description Modifier Quantity [id number] [phone] - WOUND CARE VISIT-LEV 5 EST PT 1 Electronic Signature(s) Signed: 12/22/2020 2:08:48 PM By: Shawn Stall Signed: 12/28/2020 4:34:46 PM By: Lenda Kelp PA-C Entered By: Shawn Stall on 12/22/2020 08:56:21

## 2020-12-29 ENCOUNTER — Other Ambulatory Visit: Payer: Self-pay

## 2020-12-29 ENCOUNTER — Encounter (HOSPITAL_BASED_OUTPATIENT_CLINIC_OR_DEPARTMENT_OTHER): Payer: PPO | Attending: Physician Assistant | Admitting: Physician Assistant

## 2020-12-29 DIAGNOSIS — E11622 Type 2 diabetes mellitus with other skin ulcer: Secondary | ICD-10-CM | POA: Diagnosis not present

## 2020-12-29 DIAGNOSIS — L97819 Non-pressure chronic ulcer of other part of right lower leg with unspecified severity: Secondary | ICD-10-CM | POA: Diagnosis present

## 2020-12-29 DIAGNOSIS — I872 Venous insufficiency (chronic) (peripheral): Secondary | ICD-10-CM | POA: Diagnosis not present

## 2020-12-29 DIAGNOSIS — I890 Lymphedema, not elsewhere classified: Secondary | ICD-10-CM | POA: Insufficient documentation

## 2020-12-29 DIAGNOSIS — L97812 Non-pressure chronic ulcer of other part of right lower leg with fat layer exposed: Secondary | ICD-10-CM | POA: Insufficient documentation

## 2020-12-29 DIAGNOSIS — L97822 Non-pressure chronic ulcer of other part of left lower leg with fat layer exposed: Secondary | ICD-10-CM | POA: Diagnosis not present

## 2020-12-29 DIAGNOSIS — E1151 Type 2 diabetes mellitus with diabetic peripheral angiopathy without gangrene: Secondary | ICD-10-CM | POA: Diagnosis not present

## 2020-12-29 NOTE — Progress Notes (Addendum)
JAHIEM, FAIRFAX ([id number]) . Visit Report for 12/29/2020 Chief Complaint Document Details Patient Name: Date of Service: [name] 12/29/2020 8:30 A M Medical Record Number: [id number] Patient Account Number: [id number] Date of Birth/Sex: Treating RN: 1951/11/29 (70 y.o. [name] Primary Care Provider: [name] Other Clinician: Referring Provider: Treating Provider/Extender: [name] in Treatment: 7 Information Obtained from: Patient Chief Complaint Bilateral LE Ulcers Electronic Signature(s) Signed: 12/29/2020 9:10:52 AM By: Worthy Keeler PA-C Entered By: Worthy Keeler on 12/29/2020 09:10:52 -------------------------------------------------------------------------------- HPI Details Patient Name: Date of Service: [name], [name] 12/29/2020 8:30 A M Medical Record Number: [id number] Patient Account Number: [id number] Date of Birth/Sex: Treating RN: 1951/05/18 (70 y.o. [name] Primary Care Provider: [name] Other Clinician: Referring Provider: Treating Provider/Extender: [name] in Treatment: 7 History of Present Illness HPI Description: 11/10/2020 patient presents today for initial evaluation in this clinic although I have seen this patient in [place] previous. Subsequently his issue today is different from when I saw him for I was treating him for a toe ulcer. Currently he is having issues with bilateral lower extremity ulcers after running into a metal bar. Upon inspection today he has wounds of the bilateral lower extremities which are consistent with having struck his legs on the anterior aspect. With that being said the patient did go to the ER for evaluation on October 11 where he was given Keflex initially. He went back to the ER after not improving on October 20 he was given Lasix at that point he states the Lasix seem to help the most. Has been using Neosporin and  leaving this open area which is probably not the best way to go either to be honest. He has had arterial studies on March 2021 which showed a left ABI of 0.78 with a TBI of 0.34 and a right ABI of 1.18 with a TBI 1.18. Nonetheless his arterial flow the left is not ideal but also I think potentially consistent with allowing this to heal. I think he can also support light compression with Kerlix and Coban based on this result. His most recent hemoglobin A1c that we could find was 7.3 and that was towards the end of 2020. The patient does have a history of heart disease unfortunately. He is also currently still a smoker. 11/24/2020 on evaluation today patient appears to be doing well with regard to his wounds. In fact both appear to be doing better after last week's rather last visit's debridement. Fortunately there is no signs of active infection at this time. No fevers, chills, nausea, vomiting, or diarrhea. Overall I am very pleased with where things stand today. 12/01/2020 on evaluation today patient appears to be doing well with regard to his leg ulcers. He has been tolerating the dressing changes without complication. Fortunately there is no signs of active infection at this time. No fevers, chills, nausea, vomiting, or diarrhea. 12/08/2020 on evaluation today patient appears to be doing very well in regard to his bilateral lower extremity ulcers. They seem to be doing excellent and he is making great progress. There does not appear to be any evidence of active infection at this time. No fevers, chills, nausea, vomiting, or diarrhea. 12/15/2020 upon evaluation today patient actually appears to be doing excellent in regard to his wounds. Has been tolerating the dressing changes without complication. Fortunately his wounds are showing signs of great improvement were using Hydrofera  Blue. 12/29/2020 on evaluation today patient appears to be doing well with regard to his right leg which is completely  healed. His left leg is also measuring smaller than not healed this is doing very well. Fortunately there is no signs of active infection at this time Electronic Signature(s) Signed: 12/29/2020 9:34:54 AM By: Lenda Kelp PA-C Signed: 12/29/2020 9:34:54 AM By: Lenda Kelp PA-C Entered By: Lenda Kelp on 12/29/2020 09:34:53 -------------------------------------------------------------------------------- Physical Exam Details Patient Name: Date of Service: Ginger Organ, Marijean Niemann T. 12/29/2020 8:30 A M Medical Record Number: [id number] Patient Account Number: [id number] Date of Birth/Sex: Treating RN: 1951-05-08 (70 y.o. Elizebeth Koller Primary Care Provider: Mayer Masker Other Clinician: Referring Provider: Treating Provider/Extender: Ralph Leyden Weeks in Treatment: 7 Constitutional Well-nourished and well-hydrated in no acute distress. Respiratory normal breathing without difficulty. Psychiatric this patient is able to make decisions and demonstrates good insight into disease process. Alert and Oriented x 3. pleasant and cooperative. Notes Wounds currently showed signs of excellent granulation epithelization overall again the right side is healed the left side is better and I am very pleased with how things are progressing no need for sharp debridement today. I do believe the [facility] is doing its job. Electronic Signature(s) Signed: 12/29/2020 9:35:09 AM By: Lenda Kelp PA-C Entered By: Lenda Kelp on 12/29/2020 09:35:08 -------------------------------------------------------------------------------- Physician Orders Details Patient Name: Date of Service: [place] Breck Coons, Marijean Niemann T. 12/29/2020 8:30 A M Medical Record Number: [id number] Patient Account Number: [id number] Date of Birth/Sex: Treating RN: 1951/09/18 (70 y.o. Elizebeth Koller Primary Care Provider: Mayer Masker Other Clinician: Referring Provider: Treating Provider/Extender: Marella Chimes in Treatment: 7 Verbal / Phone Orders: No Diagnosis Coding ICD-10 Coding Code Description E11.622 Type 2 diabetes mellitus with other skin ulcer I89.0 Lymphedema, not elsewhere classified I87.2 Venous insufficiency (chronic) (peripheral) L97.812 Non-pressure chronic ulcer of other part of right lower leg with fat layer exposed L97.822 Non-pressure chronic ulcer of other part of left lower leg with fat layer exposed I25.10 Atherosclerotic heart disease of native coronary artery without angina pectoris Follow-up Appointments Return Appointment in 1 week. Bathing/ Shower/ Hygiene May shower with protection but do not get wound dressing(s) wet. Edema Control - Lymphedema / SCD / Other Bilateral Lower Extremities Elevate legs to the level of the heart or above for 30 minutes daily and/or when sitting, a frequency of: - throughout the day Avoid standing for long periods of time. Exercise regularly Compression stocking or Garment 20-30 mm/Hg pressure to: - right leg daily Wound Treatment Wound #2 - Lower Leg Wound Laterality: Left, Anterior Peri-Wound Care: Sween Lotion (Moisturizing lotion) 1 x Per Week Discharge Instructions: Apply moisturizing lotion as directed Prim Dressing: Hydrofera Blue Classic Foam, 2x2 in 1 x Per Week ary Discharge Instructions: Moisten with saline prior to applying to wound bed Secondary Dressing: Woven Gauze Sponge, Non-Sterile 4x4 in 1 x Per Week Discharge Instructions: Apply over primary dressing as directed. Compression Wrap: Kerlix Roll 4.5x3.1 (in/yd) 1 x Per Week Discharge Instructions: Apply Kerlix and Coban compression as directed. Compression Wrap: Coban Self-Adherent Wrap 4x5 (in/yd) 1 x Per Week Discharge Instructions: Apply over Kerlix as directed. Electronic Signature(s) Signed: 12/29/2020 4:44:38 PM By: Lenda Kelp PA-C Signed: 12/29/2020 5:18:45 PM By: Zandra Abts RN, BSN Entered By: Zandra Abts on 12/29/2020  09:31:41 -------------------------------------------------------------------------------- Problem List Details Patient Name: Date of Service: MO Breck Coons, Marijean Niemann T. 12/29/2020 8:30 A M Medical  Record Number: [id number] Patient Account Number: [id number] Date of Birth/Sex: Treating RN: 1951/01/10 (70 y.o. [name] Primary Care Provider: [name] Other Clinician: Referring Provider: Treating Provider/Extender: [name] in Treatment: 7 Active Problems ICD-10 Encounter Code Description Active Date MDM Diagnosis E11.622 Type 2 diabetes mellitus with other skin ulcer 11/10/2020 No Yes I89.0 Lymphedema, not elsewhere classified 11/10/2020 No Yes I87.2 Venous insufficiency (chronic) (peripheral) 11/10/2020 No Yes L97.812 Non-pressure chronic ulcer of other part of right lower leg with fat layer 11/10/2020 No Yes exposed L97.822 Non-pressure chronic ulcer of other part of left lower leg with fat layer exposed11/17/2021 No Yes I25.10 Atherosclerotic heart disease of native coronary artery without angina pectoris 11/10/2020 No Yes Inactive Problems Resolved Problems Electronic Signature(s) Signed: 12/29/2020 9:10:46 AM By: Worthy Keeler PA-C Entered By: Worthy Keeler on 12/29/2020 09:10:46 -------------------------------------------------------------------------------- Progress Note Details Patient Name: Date of Service: [name], [name] 12/29/2020 8:30 A M Medical Record Number: [id number] Patient Account Number: [id number] Date of Birth/Sex: Treating RN: 1951-04-12 (70 y.o. [name] Primary Care Provider: [name] Other Clinician: Referring Provider: Treating Provider/Extender: [name] in Treatment: 7 Subjective Chief Complaint Information obtained from Patient Bilateral LE Ulcers History of Present Illness (HPI) 11/10/2020 patient presents today for initial evaluation in this clinic  although I have seen this patient in [place] previous. Subsequently his issue today is different from when I saw him for I was treating him for a toe ulcer. Currently he is having issues with bilateral lower extremity ulcers after running into a metal bar. Upon inspection today he has wounds of the bilateral lower extremities which are consistent with having struck his legs on the anterior aspect. With that being said the patient did go to the ER for evaluation on October 11 where he was given Keflex initially. He went back to the ER after not improving on October 20 he was given Lasix at that point he states the Lasix seem to help the most. Has been using Neosporin and leaving this open area which is probably not the best way to go either to be honest. He has had arterial studies on March 2021 which showed a left ABI of 0.78 with a TBI of 0.34 and a right ABI of 1.18 with a TBI 1.18. Nonetheless his arterial flow the left is not ideal but also I think potentially consistent with allowing this to heal. I think he can also support light compression with Kerlix and Coban based on this result. His most recent hemoglobin A1c that we could find was 7.3 and that was towards the end of 2020. The patient does have a history of heart disease unfortunately. He is also currently still a smoker. 11/24/2020 on evaluation today patient appears to be doing well with regard to his wounds. In fact both appear to be doing better after last week's rather last visit's debridement. Fortunately there is no signs of active infection at this time. No fevers, chills, nausea, vomiting, or diarrhea. Overall I am very pleased with where things stand today. 12/01/2020 on evaluation today patient appears to be doing well with regard to his leg ulcers. He has been tolerating the dressing changes without complication. Fortunately there is no signs of active infection at this time. No fevers, chills, nausea, vomiting, or  diarrhea. 12/08/2020 on evaluation today patient appears to be doing very well in regard to his bilateral lower extremity ulcers. They seem to be doing  excellent and he is making great progress. There does not appear to be any evidence of active infection at this time. No fevers, chills, nausea, vomiting, or diarrhea. 12/15/2020 upon evaluation today patient actually appears to be doing excellent in regard to his wounds. Has been tolerating the dressing changes without complication. Fortunately his wounds are showing signs of great improvement were using Hydrofera Blue. 12/29/2020 on evaluation today patient appears to be doing well with regard to his right leg which is completely healed. His left leg is also measuring smaller than not healed this is doing very well. Fortunately there is no signs of active infection at this time Objective Constitutional Well-nourished and well-hydrated in no acute distress. Vitals Time Taken: 8:39 AM, Height: 71 in, Weight: 225 lbs, BMI: 31.4, Temperature: 98.4 F, Pulse: 91 bpm, Respiratory Rate: 16 breaths/min, Blood Pressure: 116/82 mmHg. Respiratory normal breathing without difficulty. Psychiatric this patient is able to make decisions and demonstrates good insight into disease process. Alert and Oriented x 3. pleasant and cooperative. General Notes: Wounds currently showed signs of excellent granulation epithelization overall again the right side is healed the left side is better and I am very pleased with how things are progressing no need for sharp debridement today. I do believe the [facility] is doing its job. Integumentary (Hair, Skin) Wound #1 status is Healed - Epithelialized. Original cause of wound was Trauma. The wound is located on the Right,Anterior Lower Leg. The wound measures 0cm length x 0cm width x 0cm depth; 0cm^2 area and 0cm^3 volume. There is no tunneling or undermining noted. There is a none present amount of drainage noted. The  wound margin is distinct with the outline attached to the wound base. There is no granulation within the wound bed. There is no necrotic tissue within the wound bed. Wound #2 status is Open. Original cause of wound was Trauma. The wound is located on the Left,Anterior Lower Leg. The wound measures 1.1cm length x 0.6cm width x 0.1cm depth; 0.518cm^2 area and 0.052cm^3 volume. There is Fat Layer (Subcutaneous Tissue) exposed. There is no tunneling or undermining noted. There is a small amount of serosanguineous drainage noted. The wound margin is distinct with the outline attached to the wound base. There is large (67- 100%) red granulation within the wound bed. There is a small (1-33%) amount of necrotic tissue within the wound bed including Adherent Slough. Assessment Active Problems ICD-10 Type 2 diabetes mellitus with other skin ulcer Lymphedema, not elsewhere classified Venous insufficiency (chronic) (peripheral) Non-pressure chronic ulcer of other part of right lower leg with fat layer exposed Non-pressure chronic ulcer of other part of left lower leg with fat layer exposed Atherosclerotic heart disease of native coronary artery without angina pectoris Plan Follow-up Appointments: Return Appointment in 1 week. Bathing/ Shower/ Hygiene: May shower with protection but do not get wound dressing(s) wet. Edema Control - Lymphedema / SCD / Other: Elevate legs to the level of the heart or above for 30 minutes daily and/or when sitting, a frequency of: - throughout the day Avoid standing for long periods of time. Exercise regularly Compression stocking or Garment 20-30 mm/Hg pressure to: - right leg daily WOUND #2: - Lower Leg Wound Laterality: Left, Anterior Peri-Wound Care: Sween Lotion (Moisturizing lotion) 1 x Per Week/ Discharge Instructions: Apply moisturizing lotion as directed Prim Dressing: Hydrofera Blue Classic Foam, 2x2 in 1 x Per Week/ ary Discharge Instructions: Moisten with  saline prior to applying to wound bed Secondary Dressing: Woven Gauze Sponge, Non-Sterile  4x4 in 1 x Per Week/ Discharge Instructions: Apply over primary dressing as directed. Com pression Wrap: Kerlix Roll 4.5x3.1 (in/yd) 1 x Per Week/ Discharge Instructions: Apply Kerlix and Coban compression as directed. Com pression Wrap: Coban Self-Adherent Wrap 4x5 (in/yd) 1 x Per Week/ Discharge Instructions: Apply over Kerlix as directed. 1. I would recommend currently that we going to continue with the wound care measures as before and the patient is in agreement with the plan. This includes the use of the [facility]. 2. I am also can recommend at this time that we have the patient continue with the Curlex and Coban wrap which has done well as far as keeping his edema under control he does not need a lot of compression to do well and to be honest this is doing a great job. We will see patient back for reevaluation in 1 week here in the clinic. If anything worsens or changes patient will contact our office for additional recommendations.With regard to his right leg we will actually go ahead and transition him into the compression stocking at this point. Electronic Signature(s) Signed: 12/29/2020 9:36:00 AM By: Worthy Keeler PA-C Entered By: Worthy Keeler on 12/29/2020 09:36:00 -------------------------------------------------------------------------------- SuperBill Details Patient Name: Date of Service: [name], [name] 12/29/2020 Medical Record Number: [id number] Patient Account Number: [id number] Date of Birth/Sex: Treating RN: May 06, 1951 (70 y.o. [name] Primary Care Provider: [name] Other Clinician: Referring Provider: Treating Provider/Extender: [name] in Treatment: 7 Diagnosis Coding ICD-10 Codes Code Description [phone] Type 2 diabetes mellitus with other skin ulcer I89.0 Lymphedema, not elsewhere classified I87.2 Venous  insufficiency (chronic) (peripheral) L97.812 Non-pressure chronic ulcer of other part of right lower leg with fat layer exposed L97.822 Non-pressure chronic ulcer of other part of left lower leg with fat layer exposed I25.10 Atherosclerotic heart disease of native coronary artery without angina pectoris Facility Procedures CPT4 Code: [id number] Description: 99213 - WOUND CARE VISIT-LEV 3 EST PT Modifier: Quantity: 1 Physician Procedures : CPT4 Code Description Modifier [id number] - WC PHYS LEVEL 3 - EST PT ICD-10 Diagnosis Description E11.622 Type 2 diabetes mellitus with other skin ulcer I89.0 Lymphedema, not elsewhere classified I87.2 Venous insufficiency (chronic) (peripheral)  L97.822 Non-pressure chronic ulcer of other part of left lower leg with fat layer exposed Quantity: 1 Electronic Signature(s) Signed: 12/29/2020 9:36:16 AM By: Worthy Keeler PA-C Entered By: Worthy Keeler on 12/29/2020 09:36:16

## 2020-12-29 NOTE — Progress Notes (Signed)
[name], [name] ([id number]) . Visit Report for 12/29/2020 Arrival Information Details Patient Name: Date of Service: [name]. 12/29/2020 8:30 A M Medical Record Number: [id number] Patient Account Number: [id number] Date of Birth/Sex: Treating RN: 1951-08-17 (70 y.o. [name] Primary Care [employer]: [name] Other Clinician: Referring Sarajean Dessert: Treating Mckinzey Entwistle/Extender: [name] in Treatment: 7 Visit Information History Since Last Visit Added or deleted any medications: No Patient Arrived: Ambulatory Any new allergies or adverse reactions: No Arrival Time: 08:39 Had a fall or experienced change in No Accompanied By: self activities of daily living that may affect Transfer Assistance: None risk of falls: Patient Identification Verified: Yes Signs or symptoms of abuse/neglect since last visito No Secondary Verification Process Completed: Yes Hospitalized since last visit: No Patient Requires Transmission-Based Precautions: No Implantable device outside of the clinic excluding No Patient Has Alerts: Yes cellular tissue based products placed in the center Patient Alerts: Patient on Blood Thinner since last visit: L ABI: 0.78 TBI: 0.34 Has Dressing in Place as Prescribed: Yes R ABI: 1.18 TBI: 1.18 Pain Present Now: No Electronic Signature(s) Signed: 12/29/2020 11:11:03 AM By: Sandre Kitty Entered By: Sandre Kitty on 12/29/2020 08:39:39 -------------------------------------------------------------------------------- Clinic Level of Care Assessment Details Patient Name: Date of Service: [name] 12/29/2020 8:30 A M Medical Record Number: [id number] Patient Account Number: [id number] Date of Birth/Sex: Treating RN: [birth year] (70 y.o. [name] Primary Care Sheriece Jefcoat: [name] Other Clinician: Referring Kaylanni Ezelle: Treating Mianna Iezzi/Extender: [name] in Treatment:  7 Clinic Level of Care Assessment Items TOOL 4 Quantity Score X- 1 0 Use when only an EandM is performed on FOLLOW-UP visit ASSESSMENTS - Nursing Assessment / Reassessment X- 1 10 Reassessment of Co-morbidities (includes updates in patient status) X- 1 5 Reassessment of Adherence to Treatment Plan ASSESSMENTS - Wound and Skin A ssessment / Reassessment _0  - 0 Simple Wound Assessment / Reassessment - one wound X- 2 5 Complex Wound Assessment / Reassessment - multiple wounds _1  - 0 Dermatologic / Skin Assessment (not related to wound area) ASSESSMENTS - Focused Assessment _2  - 0 Circumferential Edema Measurements - multi extremities _3  - 0 Nutritional Assessment / Counseling / Intervention X- 1 5 Lower Extremity Assessment (monofilament, tuning fork, pulses) _4  - 0 Peripheral Arterial Disease Assessment (using hand held doppler) ASSESSMENTS - Ostomy and/or Continence Assessment and Care _5  - 0 Incontinence Assessment and Management _6  - 0 Ostomy Care Assessment and Management (repouching, etc.) PROCESS - Coordination of Care X - Simple Patient / Family Education for ongoing care 1 15 _7  - 0 Complex (extensive) Patient / Family Education for ongoing care X- 1 10 Staff obtains Programmer, systems, Records, T Results / Process Orders est _8  - 0 Staff telephones HHA, Nursing Homes / Clarify orders / etc _9  - 0 Routine Transfer to another Facility (non-emergent condition) _10  - 0 Routine Hospital Admission (non-emergent condition) _11  - 0 New Admissions / Biomedical engineer / Ordering NPWT Apligraf, etc. , _12  - 0 Emergency Hospital Admission (emergent condition) X- 1 10 Simple Discharge Coordination _13  - 0 Complex (extensive) Discharge Coordination PROCESS - Special Needs _14  - 0 Pediatric / Minor Patient Management _15  - 0 Isolation Patient Management _16  - 0 Hearing / Language / Visual special needs _17  - 0 Assessment of Community assistance (transportation, D/C planning,  etc.) _18  - 0 Additional assistance / Altered mentation _19  - 0 Support Surface(s) Assessment (bed, cushion, seat, etc.) INTERVENTIONS - Wound  Cleansing / Measurement _0  - 0 Simple Wound Cleansing - one wound X- 2 5 Complex Wound Cleansing - multiple wounds X- 1 5 Wound Imaging (photographs - any number of wounds) _1  - 0 Wound Tracing (instead of photographs) _2  - 0 Simple Wound Measurement - one wound X- 2 5 Complex Wound Measurement - multiple wounds INTERVENTIONS - Wound Dressings _3  - 0 Small Wound Dressing one or multiple wounds X- 1 15 Medium Wound Dressing one or multiple wounds _4  - 0 Large Wound Dressing one or multiple wounds <GEXBMWUXLKGMWNUU>_7<\/OZDGUYQIHKVQQVZD>_6  - 0 Application of Medications - topical <LOVFIEPPIRJJOACZ>_6<\/SAYTKZSWFUXNATFT>_7  - 0 Application of Medications - injection INTERVENTIONS - Miscellaneous _7  - 0 External ear exam _8  - 0 Specimen Collection (cultures, biopsies, blood, body fluids, etc.) _9  - 0 Specimen(s) / Culture(s) sent or taken to Lab for analysis _10  - 0 Patient Transfer (multiple staff / Civil Service fast streamer / Similar devices) _11  - 0 Simple Staple / Suture removal (25 or less) _12  - 0 Complex Staple / Suture removal (26 or more) _13  - 0 Hypo / Hyperglycemic Management (close monitor of Blood Glucose) _14  - 0 Ankle / Brachial Index (ABI) - do not check if billed separately X- 1 5 Vital Signs Has the patient been seen at the hospital within the last three years: Yes Total Score: 110 Level Of Care: New/Established - Level 3 Electronic Signature(s) Signed: 12/29/2020 5:18:45 PM By: Levan Hurst RN, BSN Entered By: Levan Hurst on 12/29/2020 09:34:04 -------------------------------------------------------------------------------- Encounter Discharge Information Details Patient Name: Date of Service: [name], Marcus [name] 12/29/2020 8:30 A M Medical Record Number: [id number] Patient Account Number: [id number] Date of Birth/Sex: Treating RN: 04-01-1951 (70 y.o. [name] Primary Care Malaia Buchta: [name] Other Clinician: Referring Kela Baccari: Treating Aimy Sweeting/Extender: [name] in Treatment: 7 Encounter Discharge Information Items Discharge Condition: Stable Ambulatory Status: Ambulatory Discharge Destination: Home Transportation: Private Auto Accompanied By: self Schedule Follow-up Appointment: Yes Clinical Summary of Care: Electronic Signature(s) Signed: 12/29/2020 5:17:23 PM By: Deon Pilling Entered By: Deon Pilling on 12/29/2020 16:13:23 -------------------------------------------------------------------------------- Lower Extremity Assessment Details Patient Name: Date of Service: [name] 12/29/2020 8:30 A M Medical Record Number: [id number] Patient Account Number: [id number] Date of Birth/Sex: Treating RN: 05-01-1951 (70 y.o. [name] Primary Care Everton Bertha: [name] Other Clinician: Referring Cataleia Gade: Treating Amanada Philbrick/Extender: [name] in Treatment: 7 Edema Assessment Assessed: [Left: Yes] [Right: Yes] Edema: [Left: No] [Right: No] Calf Left: Right: Point of Measurement: 35 cm From Medial Instep 34.5 cm 37 cm Ankle Left: Right: Point of Measurement: 11 cm From Medial Instep 22 cm 24 cm Vascular Assessment Pulses: Dorsalis Pedis Palpable: [Left:Yes] [Right:Yes] Electronic Signature(s) Signed: 12/29/2020 5:17:23 PM By: Deon Pilling Entered By: Deon Pilling on 12/29/2020 08:53:28 -------------------------------------------------------------------------------- [place] Details Patient Name: Date of Service: [name], Marcus [name] 12/29/2020 8:30 A M Medical Record Number: [id number] Patient Account Number: [id number] Date of Birth/Sex: Treating RN: [birth year] (70 y.o. [name] Primary Care Tayna Smethurst: [name] Other Clinician: Referring Michiel Sivley: Treating Fernando Torry/Extender: [name] in Treatment: 7 Active  Inactive Nutrition Nursing Diagnoses: Impaired glucose control: actual or potential Potential for alteratiion in Nutrition/Potential for imbalanced nutrition Goals: Patient/caregiver will maintain therapeutic glucose control Date Initiated: 11/10/2020 Target Resolution Date: 01/05/2021 Goal Status: Active Interventions: Assess HgA1c results as ordered upon admission and as needed Provide education on elevated blood sugars and impact on wound healing Treatment Activities: Patient referred  to Primary Care Physician for further nutritional evaluation : 11/10/2020 Notes: Venous Leg Ulcer Nursing Diagnoses: Knowledge deficit related to disease process and management Potential for venous Insuffiency (use before diagnosis confirmed) Goals: Patient will maintain optimal edema control Date Initiated: 11/10/2020 Target Resolution Date: 01/05/2021 Goal Status: Active Interventions: Assess peripheral edema status every visit. Compression as ordered Provide education on venous insufficiency Treatment Activities: Therapeutic compression applied : 11/10/2020 Notes: Wound/Skin Impairment Nursing Diagnoses: Impaired tissue integrity Knowledge deficit related to smoking impact on wound healing Knowledge deficit related to ulceration/compromised skin integrity Goals: Patient will demonstrate a reduced rate of smoking or cessation of smoking Date Initiated: 11/10/2020 Target Resolution Date: 01/05/2021 Goal Status: Active Patient/caregiver will verbalize understanding of skin care regimen Date Initiated: 11/10/2020 Target Resolution Date: 01/05/2021 Goal Status: Active Ulcer/skin breakdown will have a volume reduction of 30% by week 4 Date Initiated: 11/10/2020 Date Inactivated: 12/08/2020 Target Resolution Date: 12/08/2020 Goal Status: Met Ulcer/skin breakdown will have a volume reduction of 50% by week 8 Date Initiated: 12/08/2020 Target Resolution Date: 01/05/2021 Goal Status:  Active Interventions: Assess patient/caregiver ability to obtain necessary supplies Assess patient/caregiver ability to perform ulcer/skin care regimen upon admission and as needed Assess ulceration(s) every visit Treatment Activities: Skin care regimen initiated : 11/10/2020 Topical wound management initiated : 11/10/2020 Notes: Electronic Signature(s) Signed: 12/29/2020 5:18:45 PM By: Levan Hurst RN, BSN Entered By: Levan Hurst on 12/29/2020 09:32:10 -------------------------------------------------------------------------------- Pain Assessment Details Patient Name: Date of Service: [name], Marcus [name] 12/29/2020 8:30 A M Medical Record Number: [id number] Patient Account Number: [id number] Date of Birth/Sex: Treating RN: 01-Jun-1951 (70 y.o. [name] Primary Care Decarlo Rivet: [name] Other Clinician: Referring Amani Marseille: Treating Delman Goshorn/Extender: [name] in Treatment: 7 Active Problems Location of Pain Severity and Description of Pain Patient Has Paino No Site Locations Pain Management and Medication Current Pain Management: Electronic Signature(s) Signed: 12/29/2020 11:11:03 AM By: Sandre Kitty Signed: 12/29/2020 5:18:45 PM By: Levan Hurst RN, BSN Entered By: Sandre Kitty on 12/29/2020 08:40:09 -------------------------------------------------------------------------------- Patient/Caregiver Education Details Patient Name: Date of Service: [name] 1/5/2022andnbsp8:30 A M Medical Record Number: [id number] Patient Account Number: [id number] Date of Birth/Gender: Treating RN: 01-Jun-1951 (70 y.o. [name] Primary Care Physician: [name] Other Clinician: Referring Physician: Treating Physician/Extender: [name] in Treatment: 7 Education Assessment Education Provided To: Patient Education Topics Provided Wound/Skin Impairment: Methods:  Explain/Verbal Responses: State content correctly Electronic Signature(s) Signed: 12/29/2020 5:18:45 PM By: Levan Hurst RN, BSN Entered By: Levan Hurst on 12/29/2020 09:32:22 -------------------------------------------------------------------------------- Wound Assessment Details Patient Name: Date of Service: [name], Marcus [name] 12/29/2020 8:30 A M Medical Record Number: [id number] Patient Account Number: [id number] Date of Birth/Sex: Treating RN: 10-25-1951 (70 y.o. [name] Primary Care Abdalrahman Clementson: [name] Other Clinician: Referring Dalton Molesworth: Treating Dartanyan Deasis/Extender: [name] in Treatment: 7 Wound Status Wound Number: 1 Primary Venous Leg Ulcer Etiology: Wound Location: Right, Anterior Lower Leg Wound Healed - Epithelialized Wounding Event: Trauma Status: Date Acquired: 09/24/2020 Comorbid Cataracts, Arrhythmia, Coronary Artery Disease, Hypertension, Weeks Of Treatment: 7 History: Myocardial Infarction, Peripheral Arterial Disease, Peripheral Clustered Wound: No Venous Disease, Type II Diabetes, Osteoarthritis, Neuropathy, Received Radiation Wound Measurements Length: (cm) Width: (cm) Depth: (cm) Area: (cm) Volume: (cm) 0 % Reduction in Area: 100% 0 % Reduction in Volume: 100% 0 Epithelialization: Large (67-100%) 0 Tunneling: No 0 Undermining: No Wound Description Classification: Full Thickness Without Exposed Support Structures Wound  Margin: Distinct, outline attached Exudate Amount: None Present Foul Odor After Cleansing: No Slough/Fibrino No Wound Bed Granulation Amount: None Present (0%) Exposed Structure Necrotic Amount: None Present (0%) Fascia Exposed: No Fat Layer (Subcutaneous Tissue) Exposed: No Tendon Exposed: No Muscle Exposed: No Joint Exposed: No Bone Exposed: No Electronic Signature(s) Signed: 12/29/2020 5:17:23 PM By: Deon Pilling Signed: 12/29/2020 5:18:45 PM By: Levan Hurst RN,  BSN Entered By: Levan Hurst on 12/29/2020 09:30:32 -------------------------------------------------------------------------------- Wound Assessment Details Patient Name: Date of Service: [name], Marcus [name] 12/29/2020 8:30 A M Medical Record Number: [id number] Patient Account Number: [id number] Date of Birth/Sex: Treating RN: 04-26-1951 (70 y.o. [name] Primary Care Donnivan Villena: [name] Other Clinician: Referring Adaleena Mooers: Treating Abner Ardis/Extender: [name] in Treatment: 7 Wound Status Wound Number: 2 Primary Venous Leg Ulcer Etiology: Wound Location: Left, Anterior Lower Leg Wound Open Wounding Event: Trauma Status: Date Acquired: 09/24/2020 Comorbid Cataracts, Arrhythmia, Coronary Artery Disease, Hypertension, Weeks Of Treatment: 7 History: Myocardial Infarction, Peripheral Arterial Disease, Peripheral Clustered Wound: No Venous Disease, Type II Diabetes, Osteoarthritis, Neuropathy, Received Radiation Wound Measurements Length: (cm) 1.1 Width: (cm) 0.6 Depth: (cm) 0.1 Area: (cm) 0.518 Volume: (cm) 0.052 % Reduction in Area: 86.3% % Reduction in Volume: 93.1% Epithelialization: Large (67-100%) Tunneling: No Undermining: No Wound Description Classification: Full Thickness Without Exposed Support Structures Wound Margin: Distinct, outline attached Exudate Amount: Small Exudate Type: Serosanguineous Exudate Color: red, brown Foul Odor After Cleansing: No Slough/Fibrino Yes Wound Bed Granulation Amount: Large (67-100%) Exposed Structure Granulation Quality: Red Fascia Exposed: No Necrotic Amount: Small (1-33%) Fat Layer (Subcutaneous Tissue) Exposed: Yes Necrotic Quality: Adherent Slough Tendon Exposed: No Muscle Exposed: No Joint Exposed: No Bone Exposed: No Treatment Notes Wound #2 (Lower Leg) Wound Laterality: Left, Anterior Cleanser Peri-Wound Care Sween Lotion (Moisturizing lotion) Discharge Instruction:  Apply moisturizing lotion as directed Topical Primary Dressing Hydrofera Blue Classic Foam, 2x2 in Discharge Instruction: Moisten with saline prior to applying to wound bed Secondary Dressing Woven Gauze Sponge, Non-Sterile 4x4 in Discharge Instruction: Apply over primary dressing as directed. Secured With Compression Wrap Kerlix Roll 4.5x3.1 (in/yd) Discharge Instruction: Apply Kerlix and Coban compression as directed. Coban Self-Adherent Wrap 4x5 (in/yd) Discharge Instruction: Apply over Kerlix as directed. Compression Stockings Add-Ons Electronic Signature(s) Signed: 12/29/2020 5:17:23 PM By: Deon Pilling Entered By: Deon Pilling on 12/29/2020 08:54:32 -------------------------------------------------------------------------------- Vitals Details Patient Name: Date of Service: [name], Marcus [name] 12/29/2020 8:30 A M Medical Record Number: [id number] Patient Account Number: [id number] Date of Birth/Sex: Treating RN: 09-20-1951 (70 y.o. [name] Primary Care Sabryn Preslar: [name] Other Clinician: Referring Paralee Pendergrass: Treating Anyela Napierkowski/Extender: [name] in Treatment: 7 Vital Signs Time Taken: 08:39 Temperature (F): 98.4 Height (in): 71 Pulse (bpm): 91 Weight (lbs): 225 Respiratory Rate (breaths/min): 16 Body Mass Index (BMI): 31.4 Blood Pressure (mmHg): 116/82 Reference Range: 80 - 120 mg / dl Electronic Signature(s) Signed: 12/29/2020 11:11:03 AM By: Sandre Kitty Entered By: Sandre Kitty on 12/29/2020 08:39:59

## 2021-01-04 ENCOUNTER — Telehealth: Payer: Self-pay | Admitting: Cardiovascular Disease

## 2021-01-04 MED ORDER — APIXABAN 5 MG PO TABS
5.0000 mg | ORAL_TABLET | Freq: Two times a day (BID) | ORAL | 1 refills | Status: DC
Start: 2021-01-04 — End: 2021-03-15

## 2021-01-04 NOTE — Telephone Encounter (Signed)
*  STAT* If patient is at the pharmacy, call can be transferred to refill team.   1. Which medications need to be refilled? (please list name of each medication and dose if known) apixaban (ELIQUIS) 5 MG TABS tablet  2. Which pharmacy/location (including street and city if local pharmacy) is medication to be sent to? [facility] ([place]), [place] - [address]  3. Do they need a 30 day or 90 day supply? 90 day  Patient is out of medication.

## 2021-01-05 ENCOUNTER — Other Ambulatory Visit: Payer: Self-pay | Admitting: Physician Assistant

## 2021-01-05 ENCOUNTER — Encounter (HOSPITAL_BASED_OUTPATIENT_CLINIC_OR_DEPARTMENT_OTHER): Payer: PPO | Admitting: Physician Assistant

## 2021-01-05 ENCOUNTER — Other Ambulatory Visit: Payer: Self-pay

## 2021-01-05 DIAGNOSIS — E11622 Type 2 diabetes mellitus with other skin ulcer: Secondary | ICD-10-CM | POA: Diagnosis not present

## 2021-01-05 DIAGNOSIS — L97822 Non-pressure chronic ulcer of other part of left lower leg with fat layer exposed: Secondary | ICD-10-CM | POA: Diagnosis not present

## 2021-01-05 DIAGNOSIS — L97812 Non-pressure chronic ulcer of other part of right lower leg with fat layer exposed: Secondary | ICD-10-CM | POA: Diagnosis not present

## 2021-01-05 NOTE — Progress Notes (Addendum)
[name] ([id number]) . Visit Report for 01/05/2021 Chief Complaint Document Details Patient Name: Date of Service: [name]. 01/05/2021 8:00 A M Medical Record Number: [id number] Patient Account Number: [id number] Date of Birth/Sex: Treating RN: 1951-02-08 (70 y.o. [name] Primary Care Provider: Lorrene [name] Other Clinician: Referring Provider: Treating Provider/Extender: [name] in Treatment: 8 Information Obtained from: Patient Chief Complaint Bilateral LE Ulcers Electronic Signature(s) Signed: 01/05/2021 8:16:27 AM By: Worthy Keeler PA-C Entered By: Worthy Keeler on 01/05/2021 08:16:26 -------------------------------------------------------------------------------- HPI Details Patient Name: Date of Service: [name], Marcus [name] 01/05/2021 8:00 A M Medical Record Number: [id number] Patient Account Number: [id number] Date of Birth/Sex: Treating RN: May 04, 1951 (70 y.o. [name] Primary Care Provider: Lorrene [name] Other Clinician: Referring Provider: Treating Provider/Extender: [name] in Treatment: 8 History of Present Illness HPI Description: 11/10/2020 patient presents today for initial evaluation in this clinic although I have seen this patient in [place] previous. Subsequently his issue today is different from when I saw him for I was treating him for a toe ulcer. Currently he is having issues with bilateral lower extremity ulcers after running into a metal bar. Upon inspection today he has wounds of the bilateral lower extremities which are consistent with having struck his legs on the anterior aspect. With that being said the patient did go to the ER for evaluation on October 11 where he was given Keflex initially. He went back to the ER after not improving on October 20 he was given Lasix at that point he states the Lasix seem to help the most. Has been using Neosporin  and leaving this open area which is probably not the best way to go either to be honest. He has had arterial studies on March 2021 which showed a left ABI of 0.78 with a TBI of 0.34 and a right ABI of 1.18 with a TBI 1.18. Nonetheless his arterial flow the left is not ideal but also I think potentially consistent with allowing this to heal. I think he can also support light compression with Kerlix and Coban based on this result. His most recent hemoglobin A1c that we could find was 7.3 and that was towards the end of 2020. The patient does have a history of heart disease unfortunately. He is also currently still a smoker. 11/24/2020 on evaluation today patient appears to be doing well with regard to his wounds. In fact both appear to be doing better after last week's rather last visit's debridement. Fortunately there is no signs of active infection at this time. No fevers, chills, nausea, vomiting, or diarrhea. Overall I am very pleased with where things stand today. 12/01/2020 on evaluation today patient appears to be doing well with regard to his leg ulcers. He has been tolerating the dressing changes without complication. Fortunately there is no signs of active infection at this time. No fevers, chills, nausea, vomiting, or diarrhea. 12/08/2020 on evaluation today patient appears to be doing very well in regard to his bilateral lower extremity ulcers. They seem to be doing excellent and he is making great progress. There does not appear to be any evidence of active infection at this time. No fevers, chills, nausea, vomiting, or diarrhea. 12/15/2020 upon evaluation today patient actually appears to be doing excellent in regard to his wounds. Has been tolerating the dressing changes without complication. Fortunately his wounds are showing signs of great improvement were using Hydrofera  Blue. 12/29/2020 on evaluation today patient appears to be doing well with regard to his right leg which is completely  healed. His left leg is also measuring smaller than not healed this is doing very well. Fortunately there is no signs of active infection at this time 01/05/2021 upon evaluation today patient actually appears to be making signs of improvement with regard to his left leg. I am very pleased with how things are progressing. There is no evidence of active infection at this time. No fevers, chills, nausea, vomiting, or diarrhea. Patient is extremely happy with the fact that this is doing so well Electronic Signature(s) Signed: 01/05/2021 8:58:08 AM By: Worthy Keeler PA-C Entered By: Worthy Keeler on 01/05/2021 08:58:08 -------------------------------------------------------------------------------- Physical Exam Details Patient Name: Date of Service: [name], Marcus M T. 01/05/2021 8:00 A M Medical Record Number: [id number] Patient Account Number: [id number] Date of Birth/Sex: Treating RN: 11/08/1951 (70 y.o. [name] Primary Care Provider: Lorrene [name] Other Clinician: Referring Provider: Treating Provider/Extender: [name] in Treatment: 8 Constitutional Well-nourished and well-hydrated in no acute distress. Respiratory normal breathing without difficulty. Psychiatric this patient is able to make decisions and demonstrates good insight into disease process. Alert and Oriented x 3. pleasant and cooperative. Notes Upon inspection patient's wound bed actually showed signs of good granulation at this time. There does not appear to be any evidence of active infection which is great news. Overall no sharp debridement was necessary and I feel like the patient is making an excellent improvement at this time Electronic Signature(s) Signed: 01/05/2021 8:58:36 AM By: Worthy Keeler PA-C Entered By: Worthy Keeler on 01/05/2021 08:58:35 -------------------------------------------------------------------------------- Physician Orders Details Patient Name:  Date of Service: [name], Marcus [name] 01/05/2021 8:00 A M Medical Record Number: [id number] Patient Account Number: [id number] Date of Birth/Sex: Treating RN: [birth year] (70 y.o. [name] Primary Care Provider: Lorrene [name] Other Clinician: Referring Provider: Treating Provider/Extender: [name] in Treatment: 8 Verbal / Phone Orders: No Diagnosis Coding ICD-10 Coding Code Description E11.622 Type 2 diabetes mellitus with other skin ulcer I89.0 Lymphedema, not elsewhere classified I87.2 Venous insufficiency (chronic) (peripheral) L97.812 Non-pressure chronic ulcer of other part of right lower leg with fat layer exposed L97.822 Non-pressure chronic ulcer of other part of left lower leg with fat layer exposed I25.10 Atherosclerotic heart disease of native coronary artery without angina pectoris Follow-up Appointments Return Appointment in 1 week. Bathing/ Shower/ Hygiene May shower with protection but do not get wound dressing(s) wet. Edema Control - Lymphedema / SCD / Other Bilateral Lower Extremities Elevate legs to the level of the heart or above for 30 minutes daily and/or when sitting, a frequency of: - throughout the day Avoid standing for long periods of time. Exercise regularly Compression stocking or Garment 20-30 mm/Hg pressure to: - right leg daily Wound Treatment Wound #2 - Lower Leg Wound Laterality: Left, Anterior Peri-Wound Care: Sween Lotion (Moisturizing lotion) 1 x Per Week Discharge Instructions: Apply moisturizing lotion as directed Prim Dressing: Hydrofera Blue Classic Foam, 2x2 in 1 x Per Week ary Discharge Instructions: Moisten with saline prior to applying to wound bed Secondary Dressing: Woven Gauze Sponge, Non-Sterile 4x4 in 1 x Per Week Discharge Instructions: Apply over primary dressing as directed. Compression Wrap: Kerlix Roll 4.5x3.1 (in/yd) 1 x Per Week Discharge Instructions: Apply Kerlix and Coban  compression as directed. Compression Wrap: Coban Self-Adherent Wrap 4x5 (in/yd) 1 x Per Week Discharge Instructions:  Apply over Kerlix as directed. Electronic Signature(s) Signed: 01/05/2021 5:15:08 PM By: Worthy Keeler PA-C Signed: 01/05/2021 5:15:42 PM By: Baruch Gouty RN, BSN Entered By: Baruch Gouty on 01/05/2021 08:34:30 -------------------------------------------------------------------------------- Problem List Details Patient Name: Date of Service: [name], Marcus [name] 01/05/2021 8:00 A M Medical Record Number: [id number] Patient Account Number: [id number] Date of Birth/Sex: Treating RN: Jul 23, 1951 (70 y.o. [name] Primary Care Provider: Lorrene [name] Other Clinician: Referring Provider: Treating Provider/Extender: [name] in Treatment: 8 Active Problems ICD-10 Encounter Code Description Active Date MDM Diagnosis E11.622 Type 2 diabetes mellitus with other skin ulcer 11/10/2020 No Yes I89.0 Lymphedema, not elsewhere classified 11/10/2020 No Yes I87.2 Venous insufficiency (chronic) (peripheral) 11/10/2020 No Yes L97.812 Non-pressure chronic ulcer of other part of right lower leg with fat layer 11/10/2020 No Yes exposed L97.822 Non-pressure chronic ulcer of other part of left lower leg with fat layer exposed11/17/2021 No Yes I25.10 Atherosclerotic heart disease of native coronary artery without angina pectoris 11/10/2020 No Yes Inactive Problems Resolved Problems Electronic Signature(s) Signed: 01/05/2021 8:16:21 AM By: Worthy Keeler PA-C Entered By: Worthy Keeler on 01/05/2021 08:16:20 -------------------------------------------------------------------------------- Progress Note Details Patient Name: Date of Service: [name], Marcus [name] 01/05/2021 8:00 A M Medical Record Number: [id number] Patient Account Number: [id number] Date of Birth/Sex: Treating RN: [birth year] (70 y.o. [name] Primary Care Provider:  Lorrene [name] Other Clinician: Referring Provider: Treating Provider/Extender: [name] in Treatment: 8 Subjective Chief Complaint Information obtained from Patient Bilateral LE Ulcers History of Present Illness (HPI) 11/10/2020 patient presents today for initial evaluation in this clinic although I have seen this patient in [place] previous. Subsequently his issue today is different from when I saw him for I was treating him for a toe ulcer. Currently he is having issues with bilateral lower extremity ulcers after running into a metal bar. Upon inspection today he has wounds of the bilateral lower extremities which are consistent with having struck his legs on the anterior aspect. With that being said the patient did go to the ER for evaluation on October 11 where he was given Keflex initially. He went back to the ER after not improving on October 20 he was given Lasix at that point he states the Lasix seem to help the most. Has been using Neosporin and leaving this open area which is probably not the best way to go either to be honest. He has had arterial studies on March 2021 which showed a left ABI of 0.78 with a TBI of 0.34 and a right ABI of 1.18 with a TBI 1.18. Nonetheless his arterial flow the left is not ideal but also I think potentially consistent with allowing this to heal. I think he can also support light compression with Kerlix and Coban based on this result. His most recent hemoglobin A1c that we could find was 7.3 and that was towards the end of 2020. The patient does have a history of heart disease unfortunately. He is also currently still a smoker. 11/24/2020 on evaluation today patient appears to be doing well with regard to his wounds. In fact both appear to be doing better after last week's rather last visit's debridement. Fortunately there is no signs of active infection at this time. No fevers, chills, nausea, vomiting, or diarrhea.  Overall I am very pleased with where things stand today. 12/01/2020 on evaluation today patient appears to be doing well with regard to his leg  ulcers. He has been tolerating the dressing changes without complication. Fortunately there is no signs of active infection at this time. No fevers, chills, nausea, vomiting, or diarrhea. 12/08/2020 on evaluation today patient appears to be doing very well in regard to his bilateral lower extremity ulcers. They seem to be doing excellent and he is making great progress. There does not appear to be any evidence of active infection at this time. No fevers, chills, nausea, vomiting, or diarrhea. 12/15/2020 upon evaluation today patient actually appears to be doing excellent in regard to his wounds. Has been tolerating the dressing changes without complication. Fortunately his wounds are showing signs of great improvement were using Hydrofera Blue. 12/29/2020 on evaluation today patient appears to be doing well with regard to his right leg which is completely healed. His left leg is also measuring smaller than not healed this is doing very well. Fortunately there is no signs of active infection at this time 01/05/2021 upon evaluation today patient actually appears to be making signs of improvement with regard to his left leg. I am very pleased with how things are progressing. There is no evidence of active infection at this time. No fevers, chills, nausea, vomiting, or diarrhea. Patient is extremely happy with the fact that this is doing so well Objective Constitutional Well-nourished and well-hydrated in no acute distress. Vitals Time Taken: 8:11 AM, Height: 71 in, Weight: 225 lbs, BMI: 31.4, Temperature: 97.8 F, Pulse: 82 bpm, Respiratory Rate: 17 breaths/min, Blood Pressure: 125/74 mmHg. Respiratory normal breathing without difficulty. Psychiatric this patient is able to make decisions and demonstrates good insight into disease process. Alert and Oriented x  3. pleasant and cooperative. General Notes: Upon inspection patient's wound bed actually showed signs of good granulation at this time. There does not appear to be any evidence of active infection which is great news. Overall no sharp debridement was necessary and I feel like the patient is making an excellent improvement at this time Integumentary (Hair, Skin) Wound #2 status is Open. Original cause of wound was Trauma. The wound is located on the Left,Anterior Lower Leg. The wound measures 1.2cm length x 1cm width x 0.1cm depth; 0.942cm^2 area and 0.094cm^3 volume. There is Fat Layer (Subcutaneous Tissue) exposed. There is no tunneling or undermining noted. There is a small amount of serosanguineous drainage noted. The wound margin is distinct with the outline attached to the wound base. There is large (67-100%) red granulation within the wound bed. There is a small (1-33%) amount of necrotic tissue within the wound bed including Adherent Slough. Assessment Active Problems ICD-10 Type 2 diabetes mellitus with other skin ulcer Lymphedema, not elsewhere classified Venous insufficiency (chronic) (peripheral) Non-pressure chronic ulcer of other part of right lower leg with fat layer exposed Non-pressure chronic ulcer of other part of left lower leg with fat layer exposed Atherosclerotic heart disease of native coronary artery without angina pectoris Plan Follow-up Appointments: Return Appointment in 1 week. Bathing/ Shower/ Hygiene: May shower with protection but do not get wound dressing(s) wet. Edema Control - Lymphedema / SCD / Other: Elevate legs to the level of the heart or above for 30 minutes daily and/or when sitting, a frequency of: - throughout the day Avoid standing for long periods of time. Exercise regularly Compression stocking or Garment 20-30 mm/Hg pressure to: - right leg daily WOUND #2: - Lower Leg Wound Laterality: Left, Anterior Peri-Wound Care: Sween Lotion  (Moisturizing lotion) 1 x Per Week/ Discharge Instructions: Apply moisturizing lotion as directed Prim Dressing:  Hydrofera Blue Classic Foam, 2x2 in 1 x Per Week/ ary Discharge Instructions: Moisten with saline prior to applying to wound bed Secondary Dressing: Woven Gauze Sponge, Non-Sterile 4x4 in 1 x Per Week/ Discharge Instructions: Apply over primary dressing as directed. Com pression Wrap: Kerlix Roll 4.5x3.1 (in/yd) 1 x Per Week/ Discharge Instructions: Apply Kerlix and Coban compression as directed. Com pression Wrap: Coban Self-Adherent Wrap 4x5 (in/yd) 1 x Per Week/ Discharge Instructions: Apply over Kerlix as directed. 1. Would recommend currently that we go out and continue with the wound care measures as before and the patient is in agreement with the plan. This includes the use of the [facility] which I think is doing excellent. #2 I am going to recommend the patient continue with the Curlex and Coban wrap which does seem to have been beneficial for him. We will see patient back for reevaluation in 1 week here in the clinic. If anything worsens or changes patient will contact our office for additional recommendations. Electronic Signature(s) Signed: 01/05/2021 8:59:08 AM By: Worthy Keeler PA-C Entered By: Worthy Keeler on 01/05/2021 08:59:08 -------------------------------------------------------------------------------- SuperBill Details Patient Name: Date of Service: [name], Marcus [name] 01/05/2021 Medical Record Number: [id number] Patient Account Number: [id number] Date of Birth/Sex: Treating RN: 01/20/1951 (70 y.o. [name] Primary Care Provider: Lorrene [name] Other Clinician: Referring Provider: Treating Provider/Extender: [name] in Treatment: 8 Diagnosis Coding ICD-10 Codes Code Description [phone] Type 2 diabetes mellitus with other skin ulcer I89.0 Lymphedema, not elsewhere classified I87.2 Venous  insufficiency (chronic) (peripheral) L97.812 Non-pressure chronic ulcer of other part of right lower leg with fat layer exposed L97.822 Non-pressure chronic ulcer of other part of left lower leg with fat layer exposed I25.10 Atherosclerotic heart disease of native coronary artery without angina pectoris Facility Procedures CPT4 Code: [id number] Description: 99213 - WOUND CARE VISIT-LEV 3 EST PT Modifier: Quantity: 1 Physician Procedures : CPT4 Code Description Modifier [id number] - WC PHYS LEVEL 3 - EST PT ICD-10 Diagnosis Description E11.622 Type 2 diabetes mellitus with other skin ulcer I89.0 Lymphedema, not elsewhere classified I87.2 Venous insufficiency (chronic) (peripheral)  L97.812 Non-pressure chronic ulcer of other part of right lower leg with fat layer exposed Quantity: 1 Electronic Signature(s) Signed: 01/05/2021 8:59:22 AM By: Worthy Keeler PA-C Entered By: Worthy Keeler on 01/05/2021 08:59:21

## 2021-01-05 NOTE — Progress Notes (Signed)
Marcus, [name] ([id number]) . Visit Report for 01/05/2021 Arrival Information Details Patient Name: Date of Service: [name]. 01/05/2021 8:00 A M Medical Record Number: [id number] Patient Account Number: [id number] Date of Birth/Sex: Treating RN: 1951-06-22 (70 y.o. [name], [name] Primary Care Bryce Kimble: [name] Other Clinician: Referring Octavio Matheney: Treating Makia Bossi/Extender: [name] in Treatment: 8 Visit Information History Since Last Visit Added or deleted any medications: No Patient Arrived: Ambulatory Any new allergies or adverse reactions: No Arrival Time: 08:10 Had a fall or experienced change in No Accompanied By: self activities of daily living that may affect Transfer Assistance: None risk of falls: Patient Identification Verified: Yes Signs or symptoms of abuse/neglect since last visito No Secondary Verification Process Completed: Yes Hospitalized since last visit: No Patient Requires Transmission-Based Precautions: No Implantable device outside of the clinic excluding No Patient Has Alerts: Yes cellular tissue based products placed in the center Patient Alerts: Patient on Blood Thinner since last visit: L ABI: 0.78 TBI: 0.34 Has Dressing in Place as Prescribed: Yes R ABI: 1.18 TBI: 1.18 Has Compression in Place as Prescribed: Yes Pain Present Now: No Electronic Signature(s) Signed: 01/05/2021 5:04:31 PM By: Rhae Hammock RN Entered By: Rhae Hammock on 01/05/2021 08:10:33 -------------------------------------------------------------------------------- Clinic Level of Care Assessment Details Patient Name: Date of Service: [name]. 01/05/2021 8:00 [name] Record Number: [id number] Patient Account Number: [id number] Date of Birth/Sex: Treating RN: 08-31-1951 (70 y.o. [name] Primary Care Naythen Heikkila: [name] Other Clinician: Referring Jakeria Caissie: Treating Kailany Dinunzio/Extender:  [name] in Treatment: 8 Clinic Level of Care Assessment Items TOOL 4 Quantity Score []  - 0 Use when only an EandM is performed on FOLLOW-UP visit ASSESSMENTS - Nursing Assessment / Reassessment X- 1 10 Reassessment of Co-morbidities (includes updates in patient status) X- 1 5 Reassessment of Adherence to Treatment Plan ASSESSMENTS - Wound and Skin A ssessment / Reassessment X - Simple Wound Assessment / Reassessment - one wound 1 5 []  - 0 Complex Wound Assessment / Reassessment - multiple wounds []  - 0 Dermatologic / Skin Assessment (not related to wound area) ASSESSMENTS - Focused Assessment X- 1 5 Circumferential Edema Measurements - multi extremities []  - 0 Nutritional Assessment / Counseling / Intervention X- 1 5 Lower Extremity Assessment (monofilament, tuning fork, pulses) []  - 0 Peripheral Arterial Disease Assessment (using hand held doppler) ASSESSMENTS - Ostomy and/or Continence Assessment and Care []  - 0 Incontinence Assessment and Management []  - 0 Ostomy Care Assessment and Management (repouching, etc.) PROCESS - Coordination of Care X - Simple Patient / Family Education for ongoing care 1 15 []  - 0 Complex (extensive) Patient / Family Education for ongoing care X- 1 10 Staff obtains Programmer, systems, Records, T Results / Process Orders est []  - 0 Staff telephones HHA, Nursing Homes / Clarify orders / etc []  - 0 Routine Transfer to another Facility (non-emergent condition) []  - 0 Routine Hospital Admission (non-emergent condition) []  - 0 New Admissions / Biomedical engineer / Ordering NPWT Apligraf, etc. , []  - 0 Emergency Hospital Admission (emergent condition) X- 1 10 Simple Discharge Coordination []  - 0 Complex (extensive) Discharge Coordination PROCESS - Special Needs []  - 0 Pediatric / Minor Patient Management []  - 0 Isolation Patient Management []  - 0 Hearing / Language / Visual special needs []  - 0 Assessment  of Community assistance (transportation, D/C planning, etc.) []  - 0 Additional assistance / Altered mentation []  - 0 Support  Surface(s) Assessment (bed, cushion, seat, etc.) INTERVENTIONS - Wound Cleansing / Measurement X - Simple Wound Cleansing - one wound 1 5 []  - 0 Complex Wound Cleansing - multiple wounds X- 1 5 Wound Imaging (photographs - any number of wounds) []  - 0 Wound Tracing (instead of photographs) X- 1 5 Simple Wound Measurement - one wound []  - 0 Complex Wound Measurement - multiple wounds INTERVENTIONS - Wound Dressings X - Small Wound Dressing one or multiple wounds 1 10 []  - 0 Medium Wound Dressing one or multiple wounds []  - 0 Large Wound Dressing one or multiple wounds X- 1 5 Application of Medications - topical []  - 0 Application of Medications - injection INTERVENTIONS - Miscellaneous []  - 0 External ear exam []  - 0 Specimen Collection (cultures, biopsies, blood, body fluids, etc.) []  - 0 Specimen(s) / Culture(s) sent or taken to Lab for analysis []  - 0 Patient Transfer (multiple staff / Civil Service fast streamer / Similar devices) []  - 0 Simple Staple / Suture removal (25 or less) []  - 0 Complex Staple / Suture removal (26 or more) []  - 0 Hypo / Hyperglycemic Management (close monitor of Blood Glucose) []  - 0 Ankle / Brachial Index (ABI) - do not check if billed separately X- 1 5 Vital Signs Has the patient been seen at the hospital within the last three years: Yes Total Score: 100 Level Of Care: New/Established - Level 3 Electronic Signature(s) Signed: 01/05/2021 5:15:42 PM By: Baruch Gouty RN, BSN Entered By: Baruch Gouty on 01/05/2021 08:35:34 -------------------------------------------------------------------------------- Encounter Discharge Information Details Patient Name: Date of Service: [name], [name] 01/05/2021 8:00 A M Medical Record Number: [id number] Patient Account Number: [id number] Date of Birth/Sex: Treating RN: 1951-12-13  (70 y.o. [name] Primary Care Terrianna Holsclaw: [name] Other Clinician: Referring Jisell Majer: Treating Tamme Mozingo/Extender: [name] in Treatment: 8 Encounter Discharge Information Items Discharge Condition: Stable Ambulatory Status: Ambulatory Discharge Destination: Home Transportation: Private Auto Accompanied By: self Schedule Follow-up Appointment: Yes Clinical Summary of Care: Patient Declined Electronic Signature(s) Signed: 01/05/2021 5:04:31 PM By: Rhae Hammock RN Entered By: Rhae Hammock on 01/05/2021 09:33:32 -------------------------------------------------------------------------------- Lower Extremity Assessment Details Patient Name: Date of Service: [name], [name] 01/05/2021 8:00 A M Medical Record Number: [id number] Patient Account Number: [id number] Date of Birth/Sex: Treating RN: [birth year] (70 y.o. [name] Primary Care Maclovia Uher: [name] Other Clinician: Referring Zelene Barga: Treating Cope Marte/Extender: [name] in Treatment: 8 Edema Assessment Assessed: [Left: Yes] [Right: No] Edema: [Left: No] [Right: No] Calf Left: Right: Point of Measurement: 35 cm From Medial Instep 34.5 cm 37 cm Ankle Left: Right: Point of Measurement: 11 cm From Medial Instep 22.5 cm 24 cm Vascular Assessment Pulses: Dorsalis Pedis Palpable: [Left:Yes] Posterior Tibial Palpable: [Left:Yes] Electronic Signature(s) Signed: 01/05/2021 5:04:31 PM By: Rhae Hammock RN Entered By: Rhae Hammock on 01/05/2021 08:10:47 -------------------------------------------------------------------------------- [place] Details Patient Name: Date of Service: [name], [name] 01/05/2021 8:00 A M Medical Record Number: [id number] Patient Account Number: [id number] Date of Birth/Sex: Treating RN: 04/29/1951 (70 y.o. [name] Primary Care Nyxon Strupp: [name] Other Clinician: Referring Madeline Pho: Treating Meah Jiron/Extender: [name] in Treatment: 8 Active Inactive Nutrition Nursing Diagnoses: Impaired glucose control: actual or potential Potential for alteratiion in Nutrition/Potential for imbalanced nutrition Goals: Patient/caregiver will maintain therapeutic glucose control Date Initiated: 11/10/2020 Target Resolution Date: 01/05/2021 Goal Status: Active Interventions: Assess HgA1c results as ordered upon admission  and as needed Provide education on elevated blood sugars and impact on wound healing Treatment Activities: Patient referred to Primary Care Physician for further nutritional evaluation : 11/10/2020 Notes: Venous Leg Ulcer Nursing Diagnoses: Knowledge deficit related to disease process and management Potential for venous Insuffiency (use before diagnosis confirmed) Goals: Patient will maintain optimal edema control Date Initiated: 11/10/2020 Target Resolution Date: 01/05/2021 Goal Status: Active Interventions: Assess peripheral edema status every visit. Compression as ordered Provide education on venous insufficiency Treatment Activities: Therapeutic compression applied : 11/10/2020 Notes: Wound/Skin Impairment Nursing Diagnoses: Impaired tissue integrity Knowledge deficit related to smoking impact on wound healing Knowledge deficit related to ulceration/compromised skin integrity Goals: Patient will demonstrate a reduced rate of smoking or cessation of smoking Date Initiated: 11/10/2020 Target Resolution Date: 01/05/2021 Goal Status: Active Patient/caregiver will verbalize understanding of skin care regimen Date Initiated: 11/10/2020 Target Resolution Date: 01/05/2021 Goal Status: Active Ulcer/skin breakdown will have a volume reduction of 30% by week 4 Date Initiated: 11/10/2020 Date Inactivated: 12/08/2020 Target Resolution Date: 12/08/2020 Goal Status: Met Ulcer/skin  breakdown will have a volume reduction of 50% by week 8 Date Initiated: 12/08/2020 Target Resolution Date: 01/05/2021 Goal Status: Active Interventions: Assess patient/caregiver ability to obtain necessary supplies Assess patient/caregiver ability to perform ulcer/skin care regimen upon admission and as needed Assess ulceration(s) every visit Treatment Activities: Skin care regimen initiated : 11/10/2020 Topical wound management initiated : 11/10/2020 Notes: Electronic Signature(s) Signed: 01/05/2021 5:15:42 PM By: Baruch Gouty RN, BSN Entered By: Baruch Gouty on 01/05/2021 08:13:13 -------------------------------------------------------------------------------- Pain Assessment Details Patient Name: Date of Service: [name], [name] 01/05/2021 8:00 A M Medical Record Number: [id number] Patient Account Number: [id number] Date of Birth/Sex: Treating RN: Jun 21, 1951 (70 y.o. [name] Primary Care Stefon Ramthun: [name] Other Clinician: Referring Zamariyah Furukawa: Treating Zakayla Martinec/Extender: [name] in Treatment: 8 Active Problems Location of Pain Severity and Description of Pain Patient Has Paino No Site Locations Rate the pain. Current Pain Level: 0 Pain Management and Medication Current Pain Management: Electronic Signature(s) Signed: 01/05/2021 5:04:31 PM By: Rhae Hammock RN Entered By: Rhae Hammock on 01/05/2021 08:11:34 -------------------------------------------------------------------------------- Patient/Caregiver Education Details Patient Name: Date of Service: [name] 1/12/2022andnbsp8:00 A M Medical Record Number: [id number] Patient Account Number: [id number] Date of Birth/Gender: Treating RN: [birth year] (70 y.o. [name] Primary Care Physician: [name] Other Clinician: Referring Physician: Treating Physician/Extender: [name] in Treatment:  8 Education Assessment Education Provided To: Patient Education Topics Provided Venous: Methods: Explain/Verbal Responses: Reinforcements needed, State content correctly Electronic Signature(s) Signed: 01/05/2021 5:15:42 PM By: Baruch Gouty RN, BSN Entered By: Baruch Gouty on 01/05/2021 08:13:37 -------------------------------------------------------------------------------- Wound Assessment Details Patient Name: Date of Service: [name], [name] 01/05/2021 8:00 A M Medical Record Number: [id number] Patient Account Number: [id number] Date of Birth/Sex: Treating RN: 06/05/1951 (70 y.o. [name], [name] Primary Care Merlie Noga: [name] Other Clinician: Referring Elan Mcelvain: Treating Tarquin Welcher/Extender: [name] in Treatment: 8 Wound Status Wound Number: 2 Primary Venous Leg Ulcer Etiology: Wound Location: Left, Anterior Lower Leg Wound Open Wounding Event: Trauma Status: Date Acquired: 09/24/2020 Comorbid Cataracts, Arrhythmia, Coronary Artery Disease, Hypertension, Weeks Of Treatment: 8 History: Myocardial Infarction, Peripheral Arterial Disease, Peripheral Clustered Wound: No Venous Disease, Type II Diabetes, Osteoarthritis, Neuropathy, Received Radiation Wound Measurements Length: (cm) 1.2 Width: (cm) 1 Depth: (cm) 0.1 Area: (cm) 0.942 Volume: (cm) 0.094 Wound Description Classification: Full Thickness Without Exposed Support Structu Wound Margin:  Distinct, outline attached Exudate Amount: Small Exudate Type: Serosanguineous Exudate Color: red, brown Foul Odor After Cleansing: Slough/Fibrino % Reduction in Area: 75% % Reduction in Volume: 87.5% Epithelialization: Large (67-100%) Tunneling: No Undermining: No res No Yes Wound Bed Granulation Amount: Large (67-100%) Exposed Structure Granulation Quality: Red Fascia Exposed: No Necrotic Amount: Small (1-33%) Fat Layer (Subcutaneous Tissue) Exposed:  Yes Necrotic Quality: Adherent Slough Tendon Exposed: No Muscle Exposed: No Joint Exposed: No Bone Exposed: No Treatment Notes Wound #2 (Lower Leg) Wound Laterality: Left, Anterior Cleanser Peri-Wound Care Sween Lotion (Moisturizing lotion) Discharge Instruction: Apply moisturizing lotion as directed Topical Primary Dressing Hydrofera Blue Classic Foam, 2x2 in Discharge Instruction: Moisten with saline prior to applying to wound bed Secondary Dressing Woven Gauze Sponge, Non-Sterile 4x4 in Discharge Instruction: Apply over primary dressing as directed. Secured With Compression Wrap Kerlix Roll 4.5x3.1 (in/yd) Discharge Instruction: Apply Kerlix and Coban compression as directed. Coban Self-Adherent Wrap 4x5 (in/yd) Discharge Instruction: Apply over Kerlix as directed. Compression Stockings Add-Ons Electronic Signature(s) Signed: 01/05/2021 5:04:31 PM By: Rhae Hammock RN Entered By: Rhae Hammock on 01/05/2021 08:12:20 -------------------------------------------------------------------------------- Vitals Details Patient Name: Date of Service: [name], [name] 01/05/2021 8:00 A M Medical Record Number: [id number] Patient Account Number: [id number] Date of Birth/Sex: Treating RN: Mar 07, 1951 (70 y.o. [name], [name] Primary Care Nezar Buckles: [name] Other Clinician: Referring Masaye Gatchalian: Treating Kamy Poinsett/Extender: [name] in Treatment: 8 Vital Signs Time Taken: 08:11 Temperature (F): 97.8 Height (in): 71 Pulse (bpm): 82 Weight (lbs): 225 Respiratory Rate (breaths/min): 17 Body Mass Index (BMI): 31.4 Blood Pressure (mmHg): 125/74 Reference Range: 80 - 120 mg / dl Electronic Signature(s) Signed: 01/05/2021 5:04:31 PM By: Rhae Hammock RN Entered By: Rhae Hammock on 01/05/2021 08:11:56

## 2021-01-12 ENCOUNTER — Telehealth: Payer: Self-pay | Admitting: Physician Assistant

## 2021-01-12 ENCOUNTER — Other Ambulatory Visit: Payer: Self-pay

## 2021-01-12 ENCOUNTER — Encounter (HOSPITAL_BASED_OUTPATIENT_CLINIC_OR_DEPARTMENT_OTHER): Payer: PPO | Admitting: Physician Assistant

## 2021-01-12 DIAGNOSIS — L97822 Non-pressure chronic ulcer of other part of left lower leg with fat layer exposed: Secondary | ICD-10-CM | POA: Diagnosis not present

## 2021-01-12 DIAGNOSIS — E11622 Type 2 diabetes mellitus with other skin ulcer: Secondary | ICD-10-CM | POA: Diagnosis not present

## 2021-01-12 MED ORDER — METFORMIN HCL ER 500 MG PO TB24: ORAL_TABLET | ORAL | 0 refills | Status: DC

## 2021-01-12 NOTE — Progress Notes (Addendum)
ATHANIEL, VIRES ([id number]) . Visit Report for 01/12/2021 Chief Complaint Document Details Patient Name: Date of Service: Juanetta Gosling. 01/12/2021 8:00 A M Medical Record Number: [id number] Patient Account Number: [id number] Date of Birth/Sex: Treating RN: 1951/03/24 (70 y.o. Damaris Schooner Primary Care Provider: Mayer Masker Other Clinician: Referring Provider: Treating Provider/Extender: Marella Chimes in Treatment: 9 Information Obtained from: Patient Chief Complaint Bilateral LE Ulcers Electronic Signature(s) Signed: 01/12/2021 8:22:12 AM By: Lenda Kelp PA-C Entered By: Lenda Kelp on 01/12/2021 08:22:12 -------------------------------------------------------------------------------- HPI Details Patient Name: Date of Service: MO Breck Coons, Marijean Niemann T. 01/12/2021 8:00 A M Medical Record Number: [id number] Patient Account Number: [id number] Date of Birth/Sex: Treating RN: April 02, 1951 (70 y.o. Damaris Schooner Primary Care Provider: Mayer Masker Other Clinician: Referring Provider: Treating Provider/Extender: Ralph Leyden Weeks in Treatment: 9 History of Present Illness HPI Description: 11/10/2020 patient presents today for initial evaluation in this clinic although I have seen this patient in [place] previous. Subsequently his issue today is different from when I saw him for I was treating him for a toe ulcer. Currently he is having issues with bilateral lower extremity ulcers after running into a metal bar. Upon inspection today he has wounds of the bilateral lower extremities which are consistent with having struck his legs on the anterior aspect. With that being said the patient did go to the ER for evaluation on October 11 where he was given Keflex initially. He went back to the ER after not improving on October 20 he was given Lasix at that point he states the Lasix seem to help the most. Has been using Neosporin  and leaving this open area which is probably not the best way to go either to be honest. He has had arterial studies on March 2021 which showed a left ABI of 0.78 with a TBI of 0.34 and a right ABI of 1.18 with a TBI 1.18. Nonetheless his arterial flow the left is not ideal but also I think potentially consistent with allowing this to heal. I think he can also support light compression with Kerlix and Coban based on this result. His most recent hemoglobin A1c that we could find was 7.3 and that was towards the end of 2020. The patient does have a history of heart disease unfortunately. He is also currently still a smoker. 11/24/2020 on evaluation today patient appears to be doing well with regard to his wounds. In fact both appear to be doing better after last week's rather last visit's debridement. Fortunately there is no signs of active infection at this time. No fevers, chills, nausea, vomiting, or diarrhea. Overall I am very pleased with where things stand today. 12/01/2020 on evaluation today patient appears to be doing well with regard to his leg ulcers. He has been tolerating the dressing changes without complication. Fortunately there is no signs of active infection at this time. No fevers, chills, nausea, vomiting, or diarrhea. 12/08/2020 on evaluation today patient appears to be doing very well in regard to his bilateral lower extremity ulcers. They seem to be doing excellent and he is making great progress. There does not appear to be any evidence of active infection at this time. No fevers, chills, nausea, vomiting, or diarrhea. 12/15/2020 upon evaluation today patient actually appears to be doing excellent in regard to his wounds. Has been tolerating the dressing changes without complication. Fortunately his wounds are showing signs of great improvement were using Hydrofera  Blue. 12/29/2020 on evaluation today patient appears to be doing well with regard to his right leg which is completely  healed. His left leg is also measuring smaller than not healed this is doing very well. Fortunately there is no signs of active infection at this time 01/05/2021 upon evaluation today patient actually appears to be making signs of improvement with regard to his left leg. I am very pleased with how things are progressing. There is no evidence of active infection at this time. No fevers, chills, nausea, vomiting, or diarrhea. Patient is extremely happy with the fact that this is doing so well 01/12/2021 upon evaluation today patient appears to be doing excellent in regard to his leg ulcer. Fortunately there is no signs of active infection at this time. No fevers, chills, nausea, vomiting, or diarrhea. He has been tolerating the dressing changes without complication. Electronic Signature(s) Signed: 01/12/2021 11:37:38 AM By: Worthy Keeler PA-C Entered By: Worthy Keeler on 01/12/2021 11:37:38 -------------------------------------------------------------------------------- Physical Exam Details Patient Name: Date of Service: [name], [name] 01/12/2021 8:00 A M Medical Record Number: [id number] Patient Account Number: [id number] Date of Birth/Sex: Treating RN: [birth year] (70 y.o. Ernestene Mentionene Mention Primary Care Provider: [name] Other Clinician: Referring Provider: Treating Provider/Extender: [name] in Treatment: 9 Constitutional Well-nourished and well-hydrated in no acute distress. Respiratory normal breathing without difficulty. Psychiatric this patient is able to make decisions and demonstrates good insight into disease process. Alert and Oriented x 3. pleasant and cooperative. Notes Upon inspection patient's wound bed actually showed signs of good granulation and epithelization there was no significant need for sharp debridement today which is great news and overall feel like he is managing quite nicely here. I am extremely pleased with the  progress that he has made. Electronic Signature(s) Signed: 01/12/2021 11:37:56 AM By: Worthy Keeler PA-C Entered By: Worthy Keeler on 01/12/2021 11:37:56 -------------------------------------------------------------------------------- Physician Orders Details Patient Name: Date of Service: [name], [name] 01/12/2021 8:00 A M Medical Record Number: [id number] Patient Account Number: [id number] Date of Birth/Sex: Treating RN: 1951/10/14 (70 y.o. [name] Primary Care Provider: [name] Other Clinician: Referring Provider: Treating Provider/Extender: [name] in Treatment: 9 Verbal / Phone Orders: No Diagnosis Coding ICD-10 Coding Code Description E11.622 Type 2 diabetes mellitus with other skin ulcer I89.0 Lymphedema, not elsewhere classified I87.2 Venous insufficiency (chronic) (peripheral) L97.812 Non-pressure chronic ulcer of other part of right lower leg with fat layer exposed L97.822 Non-pressure chronic ulcer of other part of left lower leg with fat layer exposed I25.10 Atherosclerotic heart disease of native coronary artery without angina pectoris Follow-up Appointments Return Appointment in 1 week. Bathing/ Shower/ Hygiene May shower with protection but do not get wound dressing(s) wet. Edema Control - Lymphedema / SCD / Other Bilateral Lower Extremities Elevate legs to the level of the heart or above for 30 minutes daily and/or when sitting, a frequency of: - throughout the day Avoid standing for long periods of time. Exercise regularly Compression stocking or Garment 20-30 mm/Hg pressure to: - right leg daily Wound Treatment Wound #2 - Lower Leg Wound Laterality: Left, Anterior Peri-Wound Care: Sween Lotion (Moisturizing lotion) 1 x Per Week Discharge Instructions: Apply moisturizing lotion as directed Prim Dressing: Hydrofera Blue Classic Foam, 2x2 in 1 x Per Week ary Discharge Instructions: Moisten with saline  prior to applying to wound bed Secondary Dressing: Woven Gauze Sponge, Non-Sterile 4x4 in 1 x Per  Week Discharge Instructions: Apply over primary dressing as directed. Compression Wrap: Kerlix Roll 4.5x3.1 (in/yd) 1 x Per Week Discharge Instructions: Apply Kerlix and Coban compression as directed. Compression Wrap: Coban Self-Adherent Wrap 4x5 (in/yd) 1 x Per Week Discharge Instructions: Apply over Kerlix as directed. Electronic Signature(s) Signed: 01/12/2021 12:00:41 PM By: Baruch Gouty RN, BSN Signed: 01/12/2021 6:05:28 PM By: Worthy Keeler PA-C Entered By: Baruch Gouty on 01/12/2021 08:30:26 -------------------------------------------------------------------------------- Problem List Details Patient Name: Date of Service: [name], [name] 01/12/2021 8:00 A M Medical Record Number: [id number] Patient Account Number: [id number] Date of Birth/Sex: Treating RN: 1951-11-11 (70 y.o. [name] Primary Care Provider: [name] Other Clinician: Referring Provider: Treating Provider/Extender: [name] in Treatment: 9 Active Problems ICD-10 Encounter Code Description Active Date MDM Diagnosis E11.622 Type 2 diabetes mellitus with other skin ulcer 11/10/2020 No Yes I89.0 Lymphedema, not elsewhere classified 11/10/2020 No Yes I87.2 Venous insufficiency (chronic) (peripheral) 11/10/2020 No Yes L97.812 Non-pressure chronic ulcer of other part of right lower leg with fat layer 11/10/2020 No Yes exposed L97.822 Non-pressure chronic ulcer of other part of left lower leg with fat layer exposed11/17/2021 No Yes I25.10 Atherosclerotic heart disease of native coronary artery without angina pectoris 11/10/2020 No Yes Inactive Problems Resolved Problems Electronic Signature(s) Signed: 01/12/2021 8:22:04 AM By: Worthy Keeler PA-C Entered By: Worthy Keeler on 01/12/2021  08:22:04 -------------------------------------------------------------------------------- Progress Note Details Patient Name: Date of Service: [name], [name] 01/12/2021 8:00 A M Medical Record Number: [id number] Patient Account Number: [id number] Date of Birth/Sex: Treating RN: May 07, 1951 (70 y.o. [name] Primary Care Provider: [name] Other Clinician: Referring Provider: Treating Provider/Extender: [name] in Treatment: 9 Subjective Chief Complaint Information obtained from Patient Bilateral LE Ulcers History of Present Illness (HPI) 11/10/2020 patient presents today for initial evaluation in this clinic although I have seen this patient in [place] previous. Subsequently his issue today is different from when I saw him for I was treating him for a toe ulcer. Currently he is having issues with bilateral lower extremity ulcers after running into a metal bar. Upon inspection today he has wounds of the bilateral lower extremities which are consistent with having struck his legs on the anterior aspect. With that being said the patient did go to the ER for evaluation on October 11 where he was given Keflex initially. He went back to the ER after not improving on October 20 he was given Lasix at that point he states the Lasix seem to help the most. Has been using Neosporin and leaving this open area which is probably not the best way to go either to be honest. He has had arterial studies on March 2021 which showed a left ABI of 0.78 with a TBI of 0.34 and a right ABI of 1.18 with a TBI 1.18. Nonetheless his arterial flow the left is not ideal but also I think potentially consistent with allowing this to heal. I think he can also support light compression with Kerlix and Coban based on this result. His most recent hemoglobin A1c that we could find was 7.3 and that was towards the end of 2020. The patient does have a history of heart disease  unfortunately. He is also currently still a smoker. 11/24/2020 on evaluation today patient appears to be doing well with regard to his wounds. In fact both appear to be doing better after last week's rather last visit's debridement. Fortunately there  is no signs of active infection at this time. No fevers, chills, nausea, vomiting, or diarrhea. Overall I am very pleased with where things stand today. 12/01/2020 on evaluation today patient appears to be doing well with regard to his leg ulcers. He has been tolerating the dressing changes without complication. Fortunately there is no signs of active infection at this time. No fevers, chills, nausea, vomiting, or diarrhea. 12/08/2020 on evaluation today patient appears to be doing very well in regard to his bilateral lower extremity ulcers. They seem to be doing excellent and he is making great progress. There does not appear to be any evidence of active infection at this time. No fevers, chills, nausea, vomiting, or diarrhea. 12/15/2020 upon evaluation today patient actually appears to be doing excellent in regard to his wounds. Has been tolerating the dressing changes without complication. Fortunately his wounds are showing signs of great improvement were using Hydrofera Blue. 12/29/2020 on evaluation today patient appears to be doing well with regard to his right leg which is completely healed. His left leg is also measuring smaller than not healed this is doing very well. Fortunately there is no signs of active infection at this time 01/05/2021 upon evaluation today patient actually appears to be making signs of improvement with regard to his left leg. I am very pleased with how things are progressing. There is no evidence of active infection at this time. No fevers, chills, nausea, vomiting, or diarrhea. Patient is extremely happy with the fact that this is doing so well 01/12/2021 upon evaluation today patient appears to be doing excellent in regard to  his leg ulcer. Fortunately there is no signs of active infection at this time. No fevers, chills, nausea, vomiting, or diarrhea. He has been tolerating the dressing changes without complication. Objective Constitutional Well-nourished and well-hydrated in no acute distress. Vitals Time Taken: 7:57 AM, Height: 71 in, Weight: 225 lbs, BMI: 31.4, Temperature: 97.6 F, Pulse: 103 bpm, Respiratory Rate: 17 breaths/min, Blood Pressure: 135/81 mmHg. Respiratory normal breathing without difficulty. Psychiatric this patient is able to make decisions and demonstrates good insight into disease process. Alert and Oriented x 3. pleasant and cooperative. General Notes: Upon inspection patient's wound bed actually showed signs of good granulation and epithelization there was no significant need for sharp debridement today which is great news and overall feel like he is managing quite nicely here. I am extremely pleased with the progress that he has made. Integumentary (Hair, Skin) Wound #2 status is Open. Original cause of wound was Trauma. The wound is located on the Left,Anterior Lower Leg. The wound measures 0.5cm length x 0.3cm width x 0.1cm depth; 0.118cm^2 area and 0.012cm^3 volume. There is Fat Layer (Subcutaneous Tissue) exposed. There is no tunneling or undermining noted. There is a small amount of serosanguineous drainage noted. The wound margin is distinct with the outline attached to the wound base. There is large (67- 100%) red granulation within the wound bed. There is no necrotic tissue within the wound bed. Assessment Active Problems ICD-10 Type 2 diabetes mellitus with other skin ulcer Lymphedema, not elsewhere classified Venous insufficiency (chronic) (peripheral) Non-pressure chronic ulcer of other part of right lower leg with fat layer exposed Non-pressure chronic ulcer of other part of left lower leg with fat layer exposed Atherosclerotic heart disease of native coronary artery  without angina pectoris Plan Follow-up Appointments: Return Appointment in 1 week. Bathing/ Shower/ Hygiene: May shower with protection but do not get wound dressing(s) wet. Edema Control - Lymphedema /  SCD / Other: Elevate legs to the level of the heart or above for 30 minutes daily and/or when sitting, a frequency of: - throughout the day Avoid standing for long periods of time. Exercise regularly Compression stocking or Garment 20-30 mm/Hg pressure to: - right leg daily WOUND #2: - Lower Leg Wound Laterality: Left, Anterior Peri-Wound Care: Sween Lotion (Moisturizing lotion) 1 x Per Week/ Discharge Instructions: Apply moisturizing lotion as directed Prim Dressing: Hydrofera Blue Classic Foam, 2x2 in 1 x Per Week/ ary Discharge Instructions: Moisten with saline prior to applying to wound bed Secondary Dressing: Woven Gauze Sponge, Non-Sterile 4x4 in 1 x Per Week/ Discharge Instructions: Apply over primary dressing as directed. Com pression Wrap: Kerlix Roll 4.5x3.1 (in/yd) 1 x Per Week/ Discharge Instructions: Apply Kerlix and Coban compression as directed. Com pression Wrap: Coban Self-Adherent Wrap 4x5 (in/yd) 1 x Per Week/ Discharge Instructions: Apply over Kerlix as directed. 1. I am going to suggest that we continue with the wound care measures as before which includes the use of Hydrofera Blue which seems to have done excellent. 2. I am also can recommend that we continue with the Curlex and Coban wrap that also has been extremely good for him. 3. I would suggest he continue to elevate his legs much as possible when he is not working in order to keep edema under good control. We will see patient back for reevaluation in 1 week here in the clinic. If anything worsens or changes patient will contact our office for additional recommendations. Electronic Signature(s) Signed: 01/12/2021 11:38:32 AM By: Worthy Keeler PA-C Entered By: Worthy Keeler on 01/12/2021  11:38:32 -------------------------------------------------------------------------------- SuperBill Details Patient Name: Date of Service: [name], [name] 01/12/2021 Medical Record Number: [id number] Patient Account Number: [id number] Date of Birth/Sex: Treating RN: August 24, 1951 (70 y.o. [name] Primary Care Provider: [name] Other Clinician: Referring Provider: Treating Provider/Extender: [name] in Treatment: 9 Diagnosis Coding ICD-10 Codes Code Description E11.622 Type 2 diabetes mellitus with other skin ulcer I89.0 Lymphedema, not elsewhere classified I87.2 Venous insufficiency (chronic) (peripheral) L97.812 Non-pressure chronic ulcer of other part of right lower leg with fat layer exposed L97.822 Non-pressure chronic ulcer of other part of left lower leg with fat layer exposed I25.10 Atherosclerotic heart disease of native coronary artery without angina pectoris Facility Procedures CPT4 Code: [id number] Description: 99213 - WOUND CARE VISIT-LEV 3 EST PT Modifier: Quantity: 1 Physician Procedures : CPT4 Code Description Modifier [id number] - WC PHYS LEVEL 3 - EST PT ICD-10 Diagnosis Description E11.622 Type 2 diabetes mellitus with other skin ulcer I89.0 Lymphedema, not elsewhere classified I87.2 Venous insufficiency (chronic) (peripheral)  L97.822 Non-pressure chronic ulcer of other part of left lower leg with fat layer exposed Quantity: 1 Electronic Signature(s) Signed: 01/12/2021 11:38:57 AM By: Worthy Keeler PA-C Entered By: Worthy Keeler on 01/12/2021 11:38:57

## 2021-01-12 NOTE — Addendum Note (Signed)
Addended by: Mickel Crow on: 01/12/2021 11:39 AM   Modules accepted: Orders

## 2021-01-12 NOTE — Progress Notes (Signed)
[name] ([id number]) . Visit Report for 01/12/2021 Arrival Information Details Patient Name: Date of Service: [name]. 01/12/2021 8:00 A M Medical Record Number: [id number] Patient Account Number: [id number] Date of Birth/Sex: Treating RN: 1951-12-22 (70 y.o. [name] Primary Care Estaban [place]: [name] Other Clinician: Referring Valdez Brannan: Treating Jendayi Berling/Extender: [name] in Treatment: 9 Visit Information History Since Last Visit Added or deleted any medications: No Patient Arrived: Ambulatory Any new allergies or adverse reactions: No Arrival Time: 07:56 Had a fall or experienced change in No Accompanied By: self activities of daily living that may affect Transfer Assistance: None risk of falls: Patient Identification Verified: Yes Signs or symptoms of abuse/neglect since last visito No Secondary Verification Process Completed: Yes Hospitalized since last visit: No Patient Requires Transmission-Based Precautions: No Implantable device outside of the clinic excluding No Patient Has Alerts: Yes cellular tissue based products placed in the center Patient Alerts: Patient on Blood Thinner since last visit: L ABI: 0.78 TBI: 0.34 Has Dressing in Place as Prescribed: Yes R ABI: 1.18 TBI: 1.18 Pain Present Now: No Electronic Signature(s) Signed: 01/12/2021 10:10:32 AM By: Sandre Kitty Entered By: Sandre Kitty on 01/12/2021 07:57:17 -------------------------------------------------------------------------------- Clinic Level of Care Assessment Details Patient Name: Date of Service: [name]. 01/12/2021 8:00 A M Medical Record Number: [id number] Patient Account Number: [id number] Date of Birth/Sex: Treating RN: 26-Nov-1951 (69 y.o. [name] Primary Care [name]: [name] Other Clinician: Referring [name]: Treating [name]/Extender: [name] in  Treatment: 9 Clinic Level of Care Assessment Items TOOL 4 Quantity Score []  - 0 Use when only an EandM is performed on FOLLOW-UP visit ASSESSMENTS - Nursing Assessment / Reassessment X- 1 10 Reassessment of Co-morbidities (includes updates in patient status) X- 1 5 Reassessment of Adherence to Treatment Plan ASSESSMENTS - Wound and Skin A ssessment / Reassessment X - Simple Wound Assessment / Reassessment - one wound 1 5 []  - 0 Complex Wound Assessment / Reassessment - multiple wounds []  - 0 Dermatologic / Skin Assessment (not related to wound area) ASSESSMENTS - Focused Assessment []  - 0 Circumferential Edema Measurements - multi extremities []  - 0 Nutritional Assessment / Counseling / Intervention X- 1 5 Lower Extremity Assessment (monofilament, tuning fork, pulses) []  - 0 Peripheral Arterial Disease Assessment (using hand held doppler) ASSESSMENTS - Ostomy and/or Continence Assessment and Care []  - 0 Incontinence Assessment and Management []  - 0 Ostomy Care Assessment and Management (repouching, etc.) PROCESS - Coordination of Care X - Simple Patient / Family Education for ongoing care 1 15 []  - 0 Complex (extensive) Patient / Family Education for ongoing care X- 1 10 Staff obtains Programmer, systems, Records, T Results / Process Orders est []  - 0 Staff telephones HHA, Nursing Homes / Clarify orders / etc []  - 0 Routine Transfer to another Facility (non-emergent condition) []  - 0 Routine Hospital Admission (non-emergent condition) []  - 0 New Admissions / Biomedical engineer / Ordering NPWT Apligraf, etc. , []  - 0 Emergency Hospital Admission (emergent condition) X- 1 10 Simple Discharge Coordination []  - 0 Complex (extensive) Discharge Coordination PROCESS - Special Needs []  - 0 Pediatric / Minor Patient Management []  - 0 Isolation Patient Management []  - 0 Hearing / Language / Visual special needs []  - 0 Assessment of Community assistance (transportation,  D/C planning, etc.) []  - 0 Additional assistance / Altered mentation []  - 0 Support Surface(s) Assessment (bed, cushion, seat, etc.) INTERVENTIONS -  Wound Cleansing / Measurement X - Simple Wound Cleansing - one wound 1 5 []  - 0 Complex Wound Cleansing - multiple wounds X- 1 5 Wound Imaging (photographs - any number of wounds) []  - 0 Wound Tracing (instead of photographs) X- 1 5 Simple Wound Measurement - one wound []  - 0 Complex Wound Measurement - multiple wounds INTERVENTIONS - Wound Dressings X - Small Wound Dressing one or multiple wounds 1 10 []  - 0 Medium Wound Dressing one or multiple wounds []  - 0 Large Wound Dressing one or multiple wounds X- 1 5 Application of Medications - topical []  - 0 Application of Medications - injection INTERVENTIONS - Miscellaneous []  - 0 External ear exam []  - 0 Specimen Collection (cultures, biopsies, blood, body fluids, etc.) []  - 0 Specimen(s) / Culture(s) sent or taken to Lab for analysis []  - 0 Patient Transfer (multiple staff / Civil Service fast streamer / Similar devices) []  - 0 Simple Staple / Suture removal (25 or less) []  - 0 Complex Staple / Suture removal (26 or more) []  - 0 Hypo / Hyperglycemic Management (close monitor of Blood Glucose) []  - 0 Ankle / Brachial Index (ABI) - do not check if billed separately X- 1 5 Vital Signs Has the patient been seen at the hospital within the last three years: Yes Total Score: 95 Level Of Care: New/Established - Level 3 Electronic Signature(s) Signed: 01/12/2021 12:00:41 PM By: Baruch Gouty RN, BSN Entered By: Baruch Gouty on 01/12/2021 76:16:07 -------------------------------------------------------------------------------- Encounter Discharge Information Details Patient Name: Date of Service: [name], [name] 01/12/2021 8:00 A M Medical Record Number: [id number] Patient Account Number: [id number] Date of Birth/Sex: Treating RN: November 09, 1951 (70 y.o. [name] Primary Care  Winnie Barsky: [name] Other Clinician: Referring Kavish Lafitte: Treating Shoshannah Faubert/Extender: [name] in Treatment: 9 Encounter Discharge Information Items Discharge Condition: Stable Ambulatory Status: Ambulatory Discharge Destination: Home Transportation: Private Auto Accompanied By: self Schedule Follow-up Appointment: Yes Clinical Summary of Care: Electronic Signature(s) Signed: 01/12/2021 5:28:00 PM By: Deon Pilling Entered By: Deon Pilling on 01/12/2021 08:42:39 -------------------------------------------------------------------------------- Lower Extremity Assessment Details Patient Name: Date of Service: Marcus Ileana Ladd M T. 01/12/2021 8:00 A M Medical Record Number: [id number] Patient Account Number: [id number] Date of Birth/Sex: Treating RN: 1951/07/13 (70 y.o. [name] Primary Care Brytani Voth: [name] Other Clinician: Referring Saagar Tortorella: Treating Brendan Gruwell/Extender: [name] in Treatment: 9 Edema Assessment Assessed: [Left: No] [Right: No] Edema: [Left: N] [Right: o] Calf Left: Right: Point of Measurement: 35 cm From Medial Instep 34.5 cm Ankle Left: Right: Point of Measurement: 11 cm From Medial Instep 22.5 cm Vascular Assessment Pulses: Dorsalis Pedis Palpable: [Left:Yes] Electronic Signature(s) Signed: 01/12/2021 12:00:41 PM By: Baruch Gouty RN, BSN Entered By: Baruch Gouty on 01/12/2021 08:27:18 -------------------------------------------------------------------------------- [place] Details Patient Name: Date of Service: [name], [name] 01/12/2021 8:00 A M Medical Record Number: [id number] Patient Account Number: [id number] Date of Birth/Sex: Treating RN: 11/12/1951 (70 y.o. [name] Primary Care Reham Slabaugh: [name] Other Clinician: Referring Monai Hindes: Treating Shaylan Tutton/Extender: [name] in Treatment:  9 Active Inactive Nutrition Nursing Diagnoses: Impaired glucose control: actual or potential Potential for alteratiion in Nutrition/Potential for imbalanced nutrition Goals: Patient/caregiver will maintain therapeutic glucose control Date Initiated: 11/10/2020 Target Resolution Date: 02/09/2021 Goal Status: Active Interventions: Assess HgA1c results as ordered upon admission and as needed Provide education on elevated blood sugars and impact on wound healing Treatment Activities: Patient referred  to Primary Care Physician for further nutritional evaluation : 11/10/2020 Notes: Venous Leg Ulcer Nursing Diagnoses: Knowledge deficit related to disease process and management Potential for venous Insuffiency (use before diagnosis confirmed) Goals: Patient will maintain optimal edema control Date Initiated: 11/10/2020 Target Resolution Date: 02/09/2021 Goal Status: Active Interventions: Assess peripheral edema status every visit. Compression as ordered Provide education on venous insufficiency Treatment Activities: Therapeutic compression applied : 11/10/2020 Notes: Wound/Skin Impairment Nursing Diagnoses: Impaired tissue integrity Knowledge deficit related to smoking impact on wound healing Knowledge deficit related to ulceration/compromised skin integrity Goals: Patient will demonstrate a reduced rate of smoking or cessation of smoking Date Initiated: 11/10/2020 Target Resolution Date: 02/09/2021 Goal Status: Active Patient/caregiver will verbalize understanding of skin care regimen Date Initiated: 11/10/2020 Target Resolution Date: 01/05/2021 Goal Status: Active Ulcer/skin breakdown will have a volume reduction of 30% by week 4 Date Initiated: 11/10/2020 Date Inactivated: 12/08/2020 Target Resolution Date: 12/08/2020 Goal Status: Met Ulcer/skin breakdown will have a volume reduction of 50% by week 8 Date Initiated: 12/08/2020 Date Inactivated: 01/12/2021 Target  Resolution Date: 01/05/2021 Goal Status: Met Ulcer/skin breakdown will have a volume reduction of 80% by week 12 Date Initiated: 01/12/2021 Target Resolution Date: 02/09/2021 Goal Status: Active Interventions: Assess patient/caregiver ability to obtain necessary supplies Assess patient/caregiver ability to perform ulcer/skin care regimen upon admission and as needed Assess ulceration(s) every visit Treatment Activities: Skin care regimen initiated : 11/10/2020 Topical wound management initiated : 11/10/2020 Notes: Electronic Signature(s) Signed: 01/12/2021 12:00:41 PM By: Baruch Gouty RN, BSN Entered By: Baruch Gouty on 01/12/2021 08:05:20 -------------------------------------------------------------------------------- Pain Assessment Details Patient Name: Date of Service: [name], [name] 01/12/2021 8:00 A M Medical Record Number: [id number] Patient Account Number: [id number] Date of Birth/Sex: Treating RN: 1951-12-08 (70 y.o. [name] Primary Care Kla Bily: [name] Other Clinician: Referring Jamy Whyte: Treating Helen Winterhalter/Extender: [name] in Treatment: 9 Active Problems Location of Pain Severity and Description of Pain Patient Has Paino No Site Locations Pain Management and Medication Current Pain Management: Electronic Signature(s) Signed: 01/12/2021 10:10:32 AM By: Sandre Kitty Signed: 01/12/2021 12:00:41 PM By: Baruch Gouty RN, BSN Entered By: Sandre Kitty on 01/12/2021 07:57:47 -------------------------------------------------------------------------------- Patient/Caregiver Education Details Patient Name: Date of Service: Marcus Murlean Hark 1/19/2022andnbsp8:00 A M Medical Record Number: [id number] Patient Account Number: [id number] Date of Birth/Gender: Treating RN: 08-28-1951 (70 y.o. [name] Primary Care Physician: [name] Other Clinician: Referring Physician: Treating  Physician/Extender: [name] in Treatment: 9 Education Assessment Education Provided To: Patient Education Topics Provided Elevated Blood Sugar/ Impact on Healing: Methods: Explain/Verbal Responses: Reinforcements needed, State content correctly Venous: Methods: Explain/Verbal Responses: Reinforcements needed, State content correctly Electronic Signature(s) Signed: 01/12/2021 12:00:41 PM By: Baruch Gouty RN, BSN Entered By: Baruch Gouty on 01/12/2021 08:06:54 -------------------------------------------------------------------------------- Wound Assessment Details Patient Name: Date of Service: [name], [name] 01/12/2021 8:00 A M Medical Record Number: [id number] Patient Account Number: [id number] Date of Birth/Sex: Treating RN: 12/29/1950 (70 y.o. [name] Primary Care Jamale Spangler: [name] Other Clinician: Referring Anakaren Campion: Treating Cadey Bazile/Extender: [name] in Treatment: 9 Wound Status Wound Number: 2 Primary Venous Leg Ulcer Etiology: Wound Location: Left, Anterior Lower Leg Wound Open Wounding Event: Trauma Status: Date Acquired: 09/24/2020 Comorbid Cataracts, Arrhythmia, Coronary Artery Disease, Hypertension, Weeks Of Treatment: 9 History: Myocardial Infarction, Peripheral Arterial Disease, Peripheral Clustered Wound: No Venous Disease, Type II Diabetes, Osteoarthritis, Neuropathy, Received Radiation Wound Measurements Length: (cm) 0.5 Width: (  cm) 0.3 Depth: (cm) 0.1 Area: (cm) 0.118 Volume: (cm) 0.012 % Reduction in Area: 96.9% % Reduction in Volume: 98.4% Epithelialization: Large (67-100%) Tunneling: No Undermining: No Wound Description Classification: Full Thickness Without Exposed Support Structures Wound Margin: Distinct, outline attached Exudate Amount: Small Exudate Type: Serosanguineous Exudate Color: red, brown Foul Odor After Cleansing: No Slough/Fibrino  Yes Wound Bed Granulation Amount: Large (67-100%) Exposed Structure Granulation Quality: Red Fascia Exposed: No Necrotic Amount: None Present (0%) Fat Layer (Subcutaneous Tissue) Exposed: Yes Tendon Exposed: No Muscle Exposed: No Joint Exposed: No Bone Exposed: No Treatment Notes Wound #2 (Lower Leg) Wound Laterality: Left, Anterior Cleanser Peri-Wound Care Sween Lotion (Moisturizing lotion) Discharge Instruction: Apply moisturizing lotion as directed Topical Primary Dressing Hydrofera Blue Classic Foam, 2x2 in Discharge Instruction: Moisten with saline prior to applying to wound bed Secondary Dressing Woven Gauze Sponge, Non-Sterile 4x4 in Discharge Instruction: Apply over primary dressing as directed. Secured With Compression Wrap Kerlix Roll 4.5x3.1 (in/yd) Discharge Instruction: Apply Kerlix and Coban compression as directed. Coban Self-Adherent Wrap 4x5 (in/yd) Discharge Instruction: Apply over Kerlix as directed. Compression Stockings Add-Ons Electronic Signature(s) Signed: 01/12/2021 12:00:41 PM By: Baruch Gouty RN, BSN Signed: 01/12/2021 5:25:13 PM By: Rhae Hammock RN Entered By: Rhae Hammock on 01/12/2021 08:15:50 -------------------------------------------------------------------------------- Vitals Details Patient Name: Date of Service: [name], [name] 01/12/2021 8:00 A M Medical Record Number: [id number] Patient Account Number: [id number] Date of Birth/Sex: Treating RN: 1951-04-03 (70 y.o. [name] Primary Care Larnce Schnackenberg: [name] Other Clinician: Referring Gracelin Weisberg: Treating Jabaree Mercado/Extender: [name] in Treatment: 9 Vital Signs Time Taken: 07:57 Temperature (F): 97.6 Height (in): 71 Pulse (bpm): 103 Weight (lbs): 225 Respiratory Rate (breaths/min): 17 Body Mass Index (BMI): 31.4 Blood Pressure (mmHg): 135/81 Reference Range: 80 - 120 mg / dl Electronic Signature(s) Signed:  01/12/2021 10:10:32 AM By: Sandre Kitty Entered By: Sandre Kitty on 01/12/2021 07:57:39

## 2021-01-12 NOTE — Telephone Encounter (Signed)
Patient needs a refill on Metformin and uses Product/process development scientist on [employer], thanks.

## 2021-01-16 ENCOUNTER — Other Ambulatory Visit: Payer: Self-pay | Admitting: Physician Assistant

## 2021-01-16 DIAGNOSIS — E782 Mixed hyperlipidemia: Secondary | ICD-10-CM

## 2021-01-16 DIAGNOSIS — E1169 Type 2 diabetes mellitus with other specified complication: Secondary | ICD-10-CM

## 2021-01-16 DIAGNOSIS — Z0000 Encounter for general adult medical examination without abnormal findings: Secondary | ICD-10-CM

## 2021-01-16 DIAGNOSIS — E1159 Type 2 diabetes mellitus with other circulatory complications: Secondary | ICD-10-CM

## 2021-01-18 ENCOUNTER — Other Ambulatory Visit: Payer: PPO

## 2021-01-19 ENCOUNTER — Encounter (HOSPITAL_BASED_OUTPATIENT_CLINIC_OR_DEPARTMENT_OTHER): Payer: PPO | Admitting: Physician Assistant

## 2021-01-19 ENCOUNTER — Other Ambulatory Visit: Payer: Self-pay

## 2021-01-19 DIAGNOSIS — I872 Venous insufficiency (chronic) (peripheral): Secondary | ICD-10-CM | POA: Diagnosis not present

## 2021-01-19 DIAGNOSIS — E11622 Type 2 diabetes mellitus with other skin ulcer: Secondary | ICD-10-CM | POA: Diagnosis not present

## 2021-01-19 DIAGNOSIS — L97822 Non-pressure chronic ulcer of other part of left lower leg with fat layer exposed: Secondary | ICD-10-CM | POA: Diagnosis not present

## 2021-01-19 NOTE — Progress Notes (Addendum)
DERRIOUS, BOLOGNA ([id number]) . Visit Report for 01/19/2021 Chief Complaint Document Details Patient Name: Date of Service: [name]. 01/19/2021 8:00 A M Medical Record Number: [id number] Patient Account Number: [id number] Date of Birth/Sex: Treating RN: [birth year] (70 y.o. [name] Primary Care Provider: [name] Other Clinician: Referring Provider: Treating Provider/Extender: [name] in Treatment: 10 Information Obtained from: Patient Chief Complaint Bilateral LE Ulcers Electronic Signature(s) Signed: 01/19/2021 8:24:01 AM By: Worthy Keeler PA-C Entered By: Worthy Keeler on 01/19/2021 08:24:01 -------------------------------------------------------------------------------- HPI Details Patient Name: Date of Service: [name], Marcus [name] 01/19/2021 8:00 A M Medical Record Number: [id number] Patient Account Number: [id number] Date of Birth/Sex: Treating RN: 22-Sep-1951 (70 y.o. [name] Primary Care Provider: [name] Other Clinician: Referring Provider: Treating Provider/Extender: [name] in Treatment: 10 History of Present Illness HPI Description: 11/10/2020 patient presents today for initial evaluation in this clinic although I have seen this patient in [place] previous. Subsequently his issue today is different from when I saw him for I was treating him for a toe ulcer. Currently he is having issues with bilateral lower extremity ulcers after running into a metal bar. Upon inspection today he has wounds of the bilateral lower extremities which are consistent with having struck his legs on the anterior aspect. With that being said the patient did go to the ER for evaluation on October 11 where he was given Keflex initially. He went back to the ER after not improving on October 20 he was given Lasix at that point he states the Lasix seem to help the most. Has been using  Neosporin and leaving this open area which is probably not the best way to go either to be honest. He has had arterial studies on March 2021 which showed a left ABI of 0.78 with a TBI of 0.34 and a right ABI of 1.18 with a TBI 1.18. Nonetheless his arterial flow the left is not ideal but also I think potentially consistent with allowing this to heal. I think he can also support light compression with Kerlix and Coban based on this result. His most recent hemoglobin A1c that we could find was 7.3 and that was towards the end of 2020. The patient does have a history of heart disease unfortunately. He is also currently still a smoker. 11/24/2020 on evaluation today patient appears to be doing well with regard to his wounds. In fact both appear to be doing better after last week's rather last visit's debridement. Fortunately there is no signs of active infection at this time. No fevers, chills, nausea, vomiting, or diarrhea. Overall I am very pleased with where things stand today. 12/01/2020 on evaluation today patient appears to be doing well with regard to his leg ulcers. He has been tolerating the dressing changes without complication. Fortunately there is no signs of active infection at this time. No fevers, chills, nausea, vomiting, or diarrhea. 12/08/2020 on evaluation today patient appears to be doing very well in regard to his bilateral lower extremity ulcers. They seem to be doing excellent and he is making great progress. There does not appear to be any evidence of active infection at this time. No fevers, chills, nausea, vomiting, or diarrhea. 12/15/2020 upon evaluation today patient actually appears to be doing excellent in regard to his wounds. Has been tolerating the dressing changes without complication. Fortunately his wounds are showing signs of great improvement were using Hydrofera  Blue. 12/29/2020 on evaluation today patient appears to be doing well with regard to his right leg which is  completely healed. His left leg is also measuring smaller than not healed this is doing very well. Fortunately there is no signs of active infection at this time 01/05/2021 upon evaluation today patient actually appears to be making signs of improvement with regard to his left leg. I am very pleased with how things are progressing. There is no evidence of active infection at this time. No fevers, chills, nausea, vomiting, or diarrhea. Patient is extremely happy with the fact that this is doing so well 01/12/2021 upon evaluation today patient appears to be doing excellent in regard to his leg ulcer. Fortunately there is no signs of active infection at this time. No fevers, chills, nausea, vomiting, or diarrhea. He has been tolerating the dressing changes without complication. 01/19/2021 upon evaluation today patient appears to be doing well with regard to his wound. In fact this appears to be completely healed on the left leg. This is brand-new skin but overall seems to be doing quite well which is great news. No fevers, chills, nausea, vomiting, or diarrhea. Electronic Signature(s) Signed: 01/19/2021 8:31:35 AM By: Worthy Keeler PA-C Entered By: Worthy Keeler on 01/19/2021 08:31:35 -------------------------------------------------------------------------------- Physical Exam Details Patient Name: Date of Service: [name], Marcus M T. 01/19/2021 8:00 A M Medical Record Number: [id number] Patient Account Number: [id number] Date of Birth/Sex: Treating RN: 1951-02-01 (70 y.o. [name] Primary Care Provider: [name] Other Clinician: Referring Provider: Treating Provider/Extender: [name] in Treatment: 10 Constitutional Well-nourished and well-hydrated in no acute distress. Respiratory normal breathing without difficulty. Psychiatric this patient is able to make decisions and demonstrates good insight into disease process. Alert and Oriented x 3.  pleasant and cooperative. Notes Patient's wound bed showed signs of good epithelization there is no signs of active infection or anything open currently. There is brand-new skin covering this so he does need to protect it just to make sure that his compression sock does not scrape or pull on the new skin but other than that I think he is doing quite well and is ready for discharge. Electronic Signature(s) Signed: 01/19/2021 8:31:56 AM By: Worthy Keeler PA-C Entered By: Worthy Keeler on 01/19/2021 08:31:56 -------------------------------------------------------------------------------- Physician Orders Details Patient Name: Date of Service: [name], Marcus [name] 01/19/2021 8:00 A M Medical Record Number: [id number] Patient Account Number: [id number] Date of Birth/Sex: Treating RN: [birth year] (70 y.o. [name] Primary Care Provider: [name] Other Clinician: Referring Provider: Treating Provider/Extender: [name] in Treatment: 10 Verbal / Phone Orders: No Diagnosis Coding ICD-10 Coding Code Description E11.622 Type 2 diabetes mellitus with other skin ulcer I89.0 Lymphedema, not elsewhere classified I87.2 Venous insufficiency (chronic) (peripheral) L97.812 Non-pressure chronic ulcer of other part of right lower leg with fat layer exposed L97.822 Non-pressure chronic ulcer of other part of left lower leg with fat layer exposed I25.10 Atherosclerotic heart disease of native coronary artery without angina pectoris Discharge From [facility] Services Discharge from [place] Bathing/ Shower/ Hygiene May shower and wash wound with soap and water. Edema Control - Lymphedema / SCD / Other Bilateral Lower Extremities Elevate legs to the level of the heart or above for 30 minutes daily and/or when sitting, a frequency of: - throughout the day Avoid standing for long periods of time. Exercise regularly Moisturize legs daily. Compression  stocking or Garment  20-30 mm/Hg pressure to: - both legs daily Non Wound Condition pply the following to affected area as directed: - keep covered with bandaid under wrap for next 1-2 weeks to protect A Electronic Signature(s) Signed: 01/19/2021 5:03:34 PM By: Worthy Keeler PA-C Signed: 01/19/2021 6:09:51 PM By: Baruch Gouty RN, BSN Entered By: Baruch Gouty on 01/19/2021 08:31:15 -------------------------------------------------------------------------------- Problem List Details Patient Name: Date of Service: [name], Marcus [name] 01/19/2021 8:00 A M Medical Record Number: [id number] Patient Account Number: [id number] Date of Birth/Sex: Treating RN: [birth year] (70 y.o. [name] Primary Care Provider: [name] Other Clinician: Referring Provider: Treating Provider/Extender: [name] in Treatment: 10 Active Problems ICD-10 Encounter Code Description Active Date MDM Diagnosis E11.622 Type 2 diabetes mellitus with other skin ulcer 11/10/2020 No Yes I89.0 Lymphedema, not elsewhere classified 11/10/2020 No Yes I87.2 Venous insufficiency (chronic) (peripheral) 11/10/2020 No Yes L97.812 Non-pressure chronic ulcer of other part of right lower leg with fat layer 11/10/2020 No Yes exposed L97.822 Non-pressure chronic ulcer of other part of left lower leg with fat layer exposed11/17/2021 No Yes I25.10 Atherosclerotic heart disease of native coronary artery without angina pectoris 11/10/2020 No Yes Inactive Problems Resolved Problems Electronic Signature(s) Signed: 01/19/2021 8:23:53 AM By: Worthy Keeler PA-C Entered By: Worthy Keeler on 01/19/2021 08:23:53 -------------------------------------------------------------------------------- Progress Note Details Patient Name: Date of Service: [name], Marcus [name] 01/19/2021 8:00 A M Medical Record Number: [id number] Patient Account Number: [id number] Date of Birth/Sex: Treating  RN: 06-29-1951 (70 y.o. [name] Primary Care Provider: [name] Other Clinician: Referring Provider: Treating Provider/Extender: [name] in Treatment: 10 Subjective Chief Complaint Information obtained from Patient Bilateral LE Ulcers History of Present Illness (HPI) 11/10/2020 patient presents today for initial evaluation in this clinic although I have seen this patient in [place] [facility] [place] previous. Subsequently his issue today is different from when I saw him for I was treating him for a toe ulcer. Currently he is having issues with bilateral lower extremity ulcers after running into a metal bar. Upon inspection today he has wounds of the bilateral lower extremities which are consistent with having struck his legs on the anterior aspect. With that being said the patient did go to the ER for evaluation on October 11 where he was given Keflex initially. He went back to the ER after not improving on October 20 he was given Lasix at that point he states the Lasix seem to help the most. Has been using Neosporin and leaving this open area which is probably not the best way to go either to be honest. He has had arterial studies on March 2021 which showed a left ABI of 0.78 with a TBI of 0.34 and a right ABI of 1.18 with a TBI 1.18. Nonetheless his arterial flow the left is not ideal but also I think potentially consistent with allowing this to heal. I think he can also support light compression with Kerlix and Coban based on this result. His most recent hemoglobin A1c that we could find was 7.3 and that was towards the end of 2020. The patient does have a history of heart disease unfortunately. He is also currently still a smoker. 11/24/2020 on evaluation today patient appears to be doing well with regard to his wounds. In fact both appear to be doing better after last week's rather last visit's debridement. Fortunately there is no signs of active infection at  this time. No fevers, chills, nausea,  vomiting, or diarrhea. Overall I am very pleased with where things stand today. 12/01/2020 on evaluation today patient appears to be doing well with regard to his leg ulcers. He has been tolerating the dressing changes without complication. Fortunately there is no signs of active infection at this time. No fevers, chills, nausea, vomiting, or diarrhea. 12/08/2020 on evaluation today patient appears to be doing very well in regard to his bilateral lower extremity ulcers. They seem to be doing excellent and he is making great progress. There does not appear to be any evidence of active infection at this time. No fevers, chills, nausea, vomiting, or diarrhea. 12/15/2020 upon evaluation today patient actually appears to be doing excellent in regard to his wounds. Has been tolerating the dressing changes without complication. Fortunately his wounds are showing signs of great improvement were using Hydrofera Blue. 12/29/2020 on evaluation today patient appears to be doing well with regard to his right leg which is completely healed. His left leg is also measuring smaller than not healed this is doing very well. Fortunately there is no signs of active infection at this time 01/05/2021 upon evaluation today patient actually appears to be making signs of improvement with regard to his left leg. I am very pleased with how things are progressing. There is no evidence of active infection at this time. No fevers, chills, nausea, vomiting, or diarrhea. Patient is extremely happy with the fact that this is doing so well 01/12/2021 upon evaluation today patient appears to be doing excellent in regard to his leg ulcer. Fortunately there is no signs of active infection at this time. No fevers, chills, nausea, vomiting, or diarrhea. He has been tolerating the dressing changes without complication. 01/19/2021 upon evaluation today patient appears to be doing well with regard to his wound.  In fact this appears to be completely healed on the left leg. This is brand-new skin but overall seems to be doing quite well which is great news. No fevers, chills, nausea, vomiting, or diarrhea. Objective Constitutional Well-nourished and well-hydrated in no acute distress. Vitals Time Taken: 8:01 AM, Height: 71 in, Source: Stated, Weight: 225 lbs, Source: Stated, BMI: 31.4, Temperature: 97.4 F, Pulse: 89 bpm, Respiratory Rate: 18 breaths/min, Blood Pressure: 136/83 mmHg. Respiratory normal breathing without difficulty. Psychiatric this patient is able to make decisions and demonstrates good insight into disease process. Alert and Oriented x 3. pleasant and cooperative. General Notes: Patient's wound bed showed signs of good epithelization there is no signs of active infection or anything open currently. There is brand-new skin covering this so he does need to protect it just to make sure that his compression sock does not scrape or pull on the new skin but other than that I think he is doing quite well and is ready for discharge. Integumentary (Hair, Skin) Wound #2 status is Healed - Epithelialized. Original cause of wound was Trauma. The wound is located on the Left,Anterior Lower Leg. The wound measures 0cm length x 0cm width x 0cm depth; 0cm^2 area and 0cm^3 volume. There is no tunneling or undermining noted. There is a none present amount of drainage noted. The wound margin is distinct with the outline attached to the wound base. There is no granulation within the wound bed. There is no necrotic tissue within the wound bed. Assessment Active Problems ICD-10 Type 2 diabetes mellitus with other skin ulcer Lymphedema, not elsewhere classified Venous insufficiency (chronic) (peripheral) Non-pressure chronic ulcer of other part of right lower leg with fat layer exposed Non-pressure  chronic ulcer of other part of left lower leg with fat layer exposed Atherosclerotic heart disease of  native coronary artery without angina pectoris Plan Discharge From [facility] Services: Discharge from [place] Bathing/ Shower/ Hygiene: May shower and wash wound with soap and water. Edema Control - Lymphedema / SCD / Other: Elevate legs to the level of the heart or above for 30 minutes daily and/or when sitting, a frequency of: - throughout the day Avoid standing for long periods of time. Exercise regularly Moisturize legs daily. Compression stocking or Garment 20-30 mm/Hg pressure to: - both legs daily Non Wound Condition: Apply the following to affected area as directed: - keep covered with bandaid under wrap for next 1-2 weeks to protect 1. I would recommend currently that we go ahead and discontinue wound care services as the patient appears to be completely healed. 2. I am going to have him utilize his own compression stocking to help currently with his edema control that is done well for him in the past. We will see the patient back for follow-up visit as needed. Electronic Signature(s) Signed: 01/19/2021 8:35:01 AM By: Worthy Keeler PA-C Entered By: Worthy Keeler on 01/19/2021 08:35:00 -------------------------------------------------------------------------------- SuperBill Details Patient Name: Date of Service: [name], Marcus [name] 01/19/2021 Medical Record Number: [id number] Patient Account Number: [id number] Date of Birth/Sex: Treating RN: May 09, 1951 (70 y.o. [name] Primary Care Provider: [name] Other Clinician: Referring Provider: Treating Provider/Extender: [name] in Treatment: 10 Diagnosis Coding ICD-10 Codes Code Description [phone] Type 2 diabetes mellitus with other skin ulcer I89.0 Lymphedema, not elsewhere classified I87.2 Venous insufficiency (chronic) (peripheral) L97.812 Non-pressure chronic ulcer of other part of right lower leg with fat layer exposed L97.822 Non-pressure chronic ulcer of other part  of left lower leg with fat layer exposed I25.10 Atherosclerotic heart disease of native coronary artery without angina pectoris Facility Procedures CPT4 Code: [id number] Description: 99213 - WOUND CARE VISIT-LEV 3 EST PT Modifier: Quantity: 1 Physician Procedures : CPT4 Code Description Modifier [id number] - WC PHYS LEVEL 3 - EST PT ICD-10 Diagnosis Description E11.622 Type 2 diabetes mellitus with other skin ulcer I89.0 Lymphedema, not elsewhere classified I87.2 Venous insufficiency (chronic) (peripheral)  L97.812 Non-pressure chronic ulcer of other part of right lower leg with fat layer exposed Quantity: 1 Electronic Signature(s) Signed: 01/19/2021 8:35:14 AM By: Worthy Keeler PA-C Entered By: Worthy Keeler on 01/19/2021 08:35:13

## 2021-01-19 NOTE — Progress Notes (Signed)
IOANNIS, SCHUH ([id number]) . Visit Report for 01/19/2021 Arrival Information Details Patient Name: Date of Service: [name] 01/19/2021 8:00 A M Medical Record Number: [id number] Patient Account Number: [id number] Date of Birth/Sex: Treating RN: 02/25/1951 (70 y.o. [name] Primary Care Eligah Anello: [name] Other Clinician: Referring Yeva Bissette: Treating Caliya Narine/Extender: [name] in Treatment: 10 Visit Information History Since Last Visit Added or deleted any medications: No Patient Arrived: Ambulatory Any new allergies or adverse reactions: No Arrival Time: 08:01 Had a fall or experienced change in No Accompanied By: self activities of daily living that may affect Transfer Assistance: None risk of falls: Patient Identification Verified: Yes Signs or symptoms of abuse/neglect since last visito No Secondary Verification Process Completed: Yes Hospitalized since last visit: No Patient Requires Transmission-Based Precautions: No Implantable device outside of the clinic excluding No Patient Has Alerts: Yes cellular tissue based products placed in the center Patient Alerts: Patient on Blood Thinner since last visit: L ABI: 0.78 TBI: 0.34 Has Dressing in Place as Prescribed: Yes R ABI: 1.18 TBI: 1.18 Has Compression in Place as Prescribed: Yes Pain Present Now: No Electronic Signature(s) Signed: 01/19/2021 6:09:51 PM By: Baruch Gouty RN, BSN Entered By: Baruch Gouty on 01/19/2021 08:01:31 -------------------------------------------------------------------------------- Clinic Level of Care Assessment Details Patient Name: Date of Service: [name], [facility] M T. 01/19/2021 8:00 A M Medical Record Number: [id number] Patient Account Number: [id number] Date of Birth/Sex: Treating RN: 10/17/1951 (70 y.o. [name] Primary Care Anadia Helmes: [name] Other Clinician: Referring Asuzena Weis: Treating Dalinda Heidt/Extender:  [name] in Treatment: 10 Clinic Level of Care Assessment Items TOOL 4 Quantity Score []  - 0 Use when only an EandM is performed on FOLLOW-UP visit ASSESSMENTS - Nursing Assessment / Reassessment X- 1 10 Reassessment of Co-morbidities (includes updates in patient status) X- 1 5 Reassessment of Adherence to Treatment Plan ASSESSMENTS - Wound and Skin A ssessment / Reassessment X - Simple Wound Assessment / Reassessment - one wound 1 5 []  - 0 Complex Wound Assessment / Reassessment - multiple wounds []  - 0 Dermatologic / Skin Assessment (not related to wound area) ASSESSMENTS - Focused Assessment X- 1 5 Circumferential Edema Measurements - multi extremities []  - 0 Nutritional Assessment / Counseling / Intervention X- 1 5 Lower Extremity Assessment (monofilament, tuning fork, pulses) []  - 0 Peripheral Arterial Disease Assessment (using hand held doppler) ASSESSMENTS - Ostomy and/or Continence Assessment and Care []  - 0 Incontinence Assessment and Management []  - 0 Ostomy Care Assessment and Management (repouching, etc.) PROCESS - Coordination of Care X - Simple Patient / Family Education for ongoing care 1 15 []  - 0 Complex (extensive) Patient / Family Education for ongoing care X- 1 10 Staff obtains Programmer, systems, Records, T Results / Process Orders est []  - 0 Staff telephones HHA, Nursing Homes / Clarify orders / etc []  - 0 Routine Transfer to another Facility (non-emergent condition) []  - 0 Routine Hospital Admission (non-emergent condition) []  - 0 New Admissions / Biomedical engineer / Ordering NPWT Apligraf, etc. , []  - 0 Emergency Hospital Admission (emergent condition) X- 1 10 Simple Discharge Coordination []  - 0 Complex (extensive) Discharge Coordination PROCESS - Special Needs []  - 0 Pediatric / Minor Patient Management []  - 0 Isolation Patient Management []  - 0 Hearing / Language / Visual special needs []  - 0 Assessment  of Community assistance (transportation, D/C planning, etc.) []  - 0 Additional assistance / Altered mentation []  - 0  Support Surface(s) Assessment (bed, cushion, seat, etc.) INTERVENTIONS - Wound Cleansing / Measurement X - Simple Wound Cleansing - one wound 1 5 []  - 0 Complex Wound Cleansing - multiple wounds X- 1 5 Wound Imaging (photographs - any number of wounds) []  - 0 Wound Tracing (instead of photographs) []  - 0 Simple Wound Measurement - one wound []  - 0 Complex Wound Measurement - multiple wounds INTERVENTIONS - Wound Dressings []  - 0 Small Wound Dressing one or multiple wounds []  - 0 Medium Wound Dressing one or multiple wounds []  - 0 Large Wound Dressing one or multiple wounds []  - 0 Application of Medications - topical []  - 0 Application of Medications - injection INTERVENTIONS - Miscellaneous []  - 0 External ear exam []  - 0 Specimen Collection (cultures, biopsies, blood, body fluids, etc.) []  - 0 Specimen(s) / Culture(s) sent or taken to Lab for analysis []  - 0 Patient Transfer (multiple staff / Civil Service fast streamer / Similar devices) []  - 0 Simple Staple / Suture removal (25 or less) []  - 0 Complex Staple / Suture removal (26 or more) []  - 0 Hypo / Hyperglycemic Management (close monitor of Blood Glucose) []  - 0 Ankle / Brachial Index (ABI) - do not check if billed separately X- 1 5 Vital Signs Has the patient been seen at the hospital within the last three years: Yes Total Score: 80 Level Of Care: New/Established - Level 3 Electronic Signature(s) Signed: 01/19/2021 6:09:51 PM By: Baruch Gouty RN, BSN Entered By: Baruch Gouty on 01/19/2021 08:31:50 -------------------------------------------------------------------------------- Encounter Discharge Information Details Patient Name: Date of Service: [name], [name] 01/19/2021 8:00 A M Medical Record Number: [id number] Patient Account Number: [id number] Date of Birth/Sex: Treating RN: 1950-12-30 (70  y.o. [name] Primary Care Alleya Demeter: [name] Other Clinician: Referring Rishan Oyama: Treating Leola Fiore/Extender: [name] in Treatment: 10 Encounter Discharge Information Items Discharge Condition: Stable Ambulatory Status: Ambulatory Discharge Destination: Home Transportation: Private Auto Accompanied By: self Schedule Follow-up Appointment: Yes Clinical Summary of Care: Patient Declined Electronic Signature(s) Signed: 01/19/2021 6:09:51 PM By: Baruch Gouty RN, BSN Entered By: Baruch Gouty on 01/19/2021 08:33:24 -------------------------------------------------------------------------------- Lower Extremity Assessment Details Patient Name: Date of Service: [name], [name] 01/19/2021 8:00 A M Medical Record Number: [id number] Patient Account Number: [id number] Date of Birth/Sex: Treating RN: November 27, 1951 (70 y.o. [name] Primary Care Rodgers Likes: [name] Other Clinician: Referring Dantae Meunier: Treating Leiliana Foody/Extender: [name] in Treatment: 10 Edema Assessment Assessed: [Left: No] [Right: No] Edema: [Left: N] [Right: o] Calf Left: Right: Point of Measurement: 35 cm From Medial Instep 35 cm Ankle Left: Right: Point of Measurement: 11 cm From Medial Instep 23.4 cm Vascular Assessment Pulses: Dorsalis Pedis Palpable: [Left:No] Electronic Signature(s) Signed: 01/19/2021 6:09:51 PM By: Baruch Gouty RN, BSN Entered By: Baruch Gouty on 01/19/2021 08:05:22 -------------------------------------------------------------------------------- [place] Details Patient Name: Date of Service: [name], [name] 01/19/2021 8:00 A M Medical Record Number: [id number] Patient Account Number: [id number] Date of Birth/Sex: Treating RN: 1951/10/24 (70 y.o. [name] Primary Care Deaira Leckey: [name] Other Clinician: Referring Tarra Pence: Treating  Audre Cenci/Extender: [name] in Treatment: 10 Active Inactive Electronic Signature(s) Signed: 01/19/2021 6:09:51 PM By: Baruch Gouty RN, BSN Entered By: Baruch Gouty on 01/19/2021 [id number] -------------------------------------------------------------------------------- Pain Assessment Details Patient Name: Date of Service: [name], [name] 01/19/2021 8:00 A M Medical Record Number: [id number] Patient Account Number: [id number] Date of Birth/Sex:  Treating RN: 11-24-1951 (70 y.o. [name] Primary Care Maurisa Tesmer: [name] Other Clinician: Referring Kalyn Dimattia: Treating Orris Perin/Extender: [name] in Treatment: 10 Active Problems Location of Pain Severity and Description of Pain Patient Has Paino No Site Locations Rate the pain. Current Pain Level: 0 Pain Management and Medication Current Pain Management: Electronic Signature(s) Signed: 01/19/2021 6:09:51 PM By: Baruch Gouty RN, BSN Entered By: Baruch Gouty on 01/19/2021 08:02:14 -------------------------------------------------------------------------------- Patient/Caregiver Education Details Patient Name: Date of Service: MO O DY, [name] 1/26/2022andnbsp8:00 A M Medical Record Number: [id number] Patient Account Number: [id number] Date of Birth/Gender: Treating RN: December 10, 1951 (70 y.o. [name] Primary Care Physician: [name] Other Clinician: Referring Physician: Treating Physician/Extender: [name] in Treatment: 10 Education Assessment Education Provided To: Patient Education Topics Provided Venous: Methods: Explain/Verbal Responses: Reinforcements needed, State content correctly Wound/Skin Impairment: Methods: Explain/Verbal Responses: Reinforcements needed, State content correctly Electronic Signature(s) Signed: 01/19/2021 6:09:51 PM By: Baruch Gouty RN, BSN Entered By:  Baruch Gouty on 01/19/2021 08:14:00 -------------------------------------------------------------------------------- Wound Assessment Details Patient Name: Date of Service: [name], [name] 01/19/2021 8:00 A M Medical Record Number: [id number] Patient Account Number: [id number] Date of Birth/Sex: Treating RN: January 19, 1951 (70 y.o. [name] Primary Care Anajulia Leyendecker: [name] Other Clinician: Referring Sya Nestler: Treating Emir Nack/Extender: [name] in Treatment: 10 Wound Status Wound Number: 2 Primary Venous Leg Ulcer Etiology: Wound Location: Left, Anterior Lower Leg Wound Healed - Epithelialized Wounding Event: Trauma Status: Date Acquired: 09/24/2020 Comorbid Cataracts, Arrhythmia, Coronary Artery Disease, Hypertension, Weeks Of Treatment: 10 History: Myocardial Infarction, Peripheral Arterial Disease, Peripheral Clustered Wound: No Venous Disease, Type II Diabetes, Osteoarthritis, Neuropathy, Received Radiation Wound Measurements Length: (cm) Width: (cm) Depth: (cm) Area: (cm) Volume: (cm) 0 % Reduction in Area: 100% 0 % Reduction in Volume: 100% 0 Epithelialization: Large (67-100%) 0 Tunneling: No 0 Undermining: No Wound Description Classification: Full Thickness Without Exposed Support Structures Wound Margin: Distinct, outline attached Exudate Amount: None Present Foul Odor After Cleansing: No Slough/Fibrino No Wound Bed Granulation Amount: None Present (0%) Exposed Structure Necrotic Amount: None Present (0%) Fascia Exposed: No Fat Layer (Subcutaneous Tissue) Exposed: No Tendon Exposed: No Muscle Exposed: No Joint Exposed: No Bone Exposed: No Treatment Notes Wound #2 (Lower Leg) Wound Laterality: Left, Anterior Cleanser Peri-Wound Care Topical Primary Dressing Secondary Dressing Secured With Compression Wrap Compression Stockings Add-Ons Electronic Signature(s) Signed: 01/19/2021 6:09:51 PM By:  Baruch Gouty RN, BSN Entered By: Baruch Gouty on 01/19/2021 08:28:44 -------------------------------------------------------------------------------- [place] Details Patient Name: Date of Service: [name], [name] 01/19/2021 8:00 A M Medical Record Number: [id number] Patient Account Number: [id number] Date of Birth/Sex: Treating RN: 24-Dec-1951 (70 y.o. [name] Primary Care Daria Mcmeekin: [name] Other Clinician: Referring Tyquasia Pant: Treating Berlynn Warsame/Extender: [name] in Treatment: 10 Vital Signs Time Taken: 08:01 Temperature (F): 97.4 Height (in): 71 Pulse (bpm): 89 Source: Stated Respiratory Rate (breaths/min): 18 Weight (lbs): 225 Blood Pressure (mmHg): 136/83 Source: Stated Reference Range: 80 - 120 mg / dl Body Mass Index (BMI): 31.4 Electronic Signature(s) Signed: 01/19/2021 6:09:51 PM By: Baruch Gouty RN, BSN Entered By: Baruch Gouty on 01/19/2021 08:02:06

## 2021-01-20 ENCOUNTER — Ambulatory Visit: Payer: PPO | Admitting: Physician Assistant

## 2021-01-26 ENCOUNTER — Other Ambulatory Visit: Payer: Self-pay | Admitting: Physician Assistant

## 2021-01-26 DIAGNOSIS — E1142 Type 2 diabetes mellitus with diabetic polyneuropathy: Secondary | ICD-10-CM

## 2021-02-07 ENCOUNTER — Other Ambulatory Visit: Payer: Self-pay

## 2021-02-07 ENCOUNTER — Other Ambulatory Visit: Payer: PPO

## 2021-02-07 DIAGNOSIS — Z0000 Encounter for general adult medical examination without abnormal findings: Secondary | ICD-10-CM

## 2021-02-07 DIAGNOSIS — E669 Obesity, unspecified: Secondary | ICD-10-CM

## 2021-02-07 DIAGNOSIS — E1159 Type 2 diabetes mellitus with other circulatory complications: Secondary | ICD-10-CM

## 2021-02-07 DIAGNOSIS — E1169 Type 2 diabetes mellitus with other specified complication: Secondary | ICD-10-CM | POA: Diagnosis not present

## 2021-02-07 DIAGNOSIS — E782 Mixed hyperlipidemia: Secondary | ICD-10-CM | POA: Diagnosis not present

## 2021-02-07 DIAGNOSIS — I152 Hypertension secondary to endocrine disorders: Secondary | ICD-10-CM | POA: Diagnosis not present

## 2021-02-08 LAB — LIPID PANEL
Chol/HDL Ratio: 3.1 ratio (ref 0.0–5.0)
Cholesterol, Total: 92 mg/dL — ABNORMAL LOW (ref 100–199)
HDL: 30 mg/dL — ABNORMAL LOW (ref >39)
LDL Chol Calc (NIH): 44 mg/dL (ref 0–99)
Triglycerides: 94 mg/dL (ref 0–149)
VLDL Cholesterol Cal: 18 mg/dL (ref 5–40)

## 2021-02-08 LAB — CBC
Hematocrit: 45.4 % (ref 37.5–51.0)
Hemoglobin: 14.8 g/dL (ref 13.0–17.7)
MCH: 27.4 pg (ref 26.6–33.0)
MCHC: 32.6 g/dL (ref 31.5–35.7)
MCV: 84 fL (ref 79–97)
Platelets: 271 10*3/uL (ref 150–450)
RBC: 5.4 x10E6/uL (ref 4.14–5.80)
RDW: 16 % — ABNORMAL HIGH (ref 11.6–15.4)
WBC: 10.8 10*3/uL (ref 3.4–10.8)

## 2021-02-08 LAB — TSH: TSH: 1.93 u[IU]/mL (ref 0.450–4.500)

## 2021-02-08 LAB — COMPREHENSIVE METABOLIC PANEL
ALT: 13 IU/L (ref 0–44)
AST: 14 IU/L (ref 0–40)
Albumin/Globulin Ratio: 1.8 (ref 1.2–2.2)
Albumin: 4.4 g/dL (ref 3.8–4.8)
Alkaline Phosphatase: 123 IU/L — ABNORMAL HIGH (ref 44–121)
BUN/Creatinine Ratio: 16 (ref 10–24)
BUN: 11 mg/dL (ref 8–27)
Bilirubin Total: 0.5 mg/dL (ref 0.0–1.2)
CO2: 23 mmol/L (ref 20–29)
Calcium: 9.4 mg/dL (ref 8.6–10.2)
Chloride: 102 mmol/L (ref 96–106)
Creatinine, Ser: 0.69 mg/dL — ABNORMAL LOW (ref 0.76–1.27)
GFR calc Af Amer: 113 mL/min/{1.73_m2} (ref >59)
GFR calc non Af Amer: 98 mL/min/{1.73_m2} (ref >59)
Globulin, Total: 2.5 g/dL (ref 1.5–4.5)
Glucose: 145 mg/dL — ABNORMAL HIGH (ref 65–99)
Potassium: 4.2 mmol/L (ref 3.5–5.2)
Sodium: 141 mmol/L (ref 134–144)
Total Protein: 6.9 g/dL (ref 6.0–8.5)

## 2021-02-08 LAB — HEMOGLOBIN A1C
Est. average glucose Bld gHb Est-mCnc: 186 mg/dL
Hgb A1c MFr Bld: 8.1 % — ABNORMAL HIGH (ref 4.8–5.6)

## 2021-02-10 ENCOUNTER — Encounter: Payer: Self-pay | Admitting: Physician Assistant

## 2021-02-10 ENCOUNTER — Telehealth: Payer: Self-pay | Admitting: Dermatology

## 2021-02-10 ENCOUNTER — Other Ambulatory Visit: Payer: Self-pay

## 2021-02-10 ENCOUNTER — Ambulatory Visit (INDEPENDENT_AMBULATORY_CARE_PROVIDER_SITE_OTHER): Payer: PPO | Admitting: Physician Assistant

## 2021-02-10 VITALS — BP 132/75 | HR 88 | Temp 97.4°F | Ht 71.0 in | Wt 221.8 lb

## 2021-02-10 DIAGNOSIS — E669 Obesity, unspecified: Secondary | ICD-10-CM | POA: Diagnosis not present

## 2021-02-10 DIAGNOSIS — E1159 Type 2 diabetes mellitus with other circulatory complications: Secondary | ICD-10-CM | POA: Diagnosis not present

## 2021-02-10 DIAGNOSIS — L0390 Cellulitis, unspecified: Secondary | ICD-10-CM

## 2021-02-10 DIAGNOSIS — E782 Mixed hyperlipidemia: Secondary | ICD-10-CM

## 2021-02-10 DIAGNOSIS — I152 Hypertension secondary to endocrine disorders: Secondary | ICD-10-CM

## 2021-02-10 DIAGNOSIS — L0291 Cutaneous abscess, unspecified: Secondary | ICD-10-CM

## 2021-02-10 DIAGNOSIS — E1169 Type 2 diabetes mellitus with other specified complication: Secondary | ICD-10-CM | POA: Diagnosis not present

## 2021-02-10 MED ORDER — CANAGLIFLOZIN 300 MG PO TABS: 300.0000 mg | ORAL_TABLET | Freq: Every day | ORAL | 0 refills | Status: DC

## 2021-02-10 MED ORDER — FREESTYLE LIBRE 14 DAY SENSOR MISC: 0 refills | Status: DC

## 2021-02-10 MED ORDER — FREESTYLE LIBRE 14 DAY READER DEVI: 1.0000 | Freq: Every day | 1 refills | Status: DC

## 2021-02-10 MED ORDER — DOXYCYCLINE HYCLATE 100 MG PO TABS: 100.0000 mg | ORAL_TABLET | Freq: Two times a day (BID) | ORAL | 0 refills | Status: DC

## 2021-02-10 NOTE — Telephone Encounter (Signed)
Notes documented and referral routed back to referring office. 

## 2021-02-10 NOTE — Patient Instructions (Signed)
Diabetes Mellitus and Nutrition, Adult When you have diabetes, or diabetes mellitus, it is very important to have healthy eating habits because your blood sugar (glucose) levels are greatly affected by what you eat and drink. Eating healthy foods in the right amounts, at about the same times every day, can help you:  Control your blood glucose.  Lower your risk of heart disease.  Improve your blood pressure.  Reach or maintain a healthy weight. What can affect my meal plan? Every person with diabetes is different, and each person has different needs for a meal plan. Your health care provider may recommend that you work with a dietitian to make a meal plan that is best for you. Your meal plan may vary depending on factors such as:  The calories you need.  The medicines you take.  Your weight.  Your blood glucose, blood pressure, and cholesterol levels.  Your activity level.  Other health conditions you have, such as heart or kidney disease. How do carbohydrates affect me? Carbohydrates, also called carbs, affect your blood glucose level more than any other type of food. Eating carbs naturally raises the amount of glucose in your blood. Carb counting is a method for keeping track of how many carbs you eat. Counting carbs is important to keep your blood glucose at a healthy level, especially if you use insulin or take certain oral diabetes medicines. It is important to know how many carbs you can safely have in each meal. This is different for every person. Your dietitian can help you calculate how many carbs you should have at each meal and for each snack. How does alcohol affect me? Alcohol can cause a sudden decrease in blood glucose (hypoglycemia), especially if you use insulin or take certain oral diabetes medicines. Hypoglycemia can be a life-threatening condition. Symptoms of hypoglycemia, such as sleepiness, dizziness, and confusion, are similar to symptoms of having too much  alcohol.  Do not drink alcohol if: ? Your health care provider tells you not to drink. ? You are pregnant, may be pregnant, or are planning to become pregnant.  If you drink alcohol: ? Do not drink on an empty stomach. ? Limit how much you use to:  0-1 drink a day for women.  0-2 drinks a day for men. ? Be aware of how much alcohol is in your drink. In the [place] one drink equals one 12 oz bottle of beer (355 mL), one 5 oz glass of wine (148 mL), or one 1 oz glass of hard liquor (44 mL). ? Keep yourself hydrated with water, diet soda, or unsweetened iced tea.  Keep in mind that regular soda, juice, and other mixers may contain a lot of sugar and must be counted as carbs. What are tips for following this plan? Reading food labels  Start by checking the serving size on the "Nutrition Facts" label of packaged foods and drinks. The amount of calories, carbs, fats, and other nutrients listed on the label is based on one serving of the item. Many items contain more than one serving per package.  Check the total grams (g) of carbs in one serving. You can calculate the number of servings of carbs in one serving by dividing the total carbs by 15. For example, if a food has 30 g of total carbs per serving, it would be equal to 2 servings of carbs.  Check the number of grams (g) of saturated fats and trans fats in one serving. Choose foods that have   a low amount or none of these fats.  Check the number of milligrams (mg) of salt (sodium) in one serving. Most people should limit total sodium intake to less than 2,300 mg per day.  Always check the nutrition information of foods labeled as "low-fat" or "nonfat." These foods may be higher in added sugar or refined carbs and should be avoided.  Talk to your dietitian to identify your daily goals for nutrients listed on the label. Shopping  Avoid buying canned, pre-made, or processed foods. These foods tend to be high in fat, sodium, and added  sugar.  Shop around the outside edge of the grocery store. This is where you will most often find fresh fruits and vegetables, bulk grains, fresh meats, and fresh dairy. Cooking  Use low-heat cooking methods, such as baking, instead of high-heat cooking methods like deep frying.  Cook using healthy oils, such as olive, canola, or sunflower oil.  Avoid cooking with butter, cream, or high-fat meats. Meal planning  Eat meals and snacks regularly, preferably at the same times every day. Avoid going long periods of time without eating.  Eat foods that are high in fiber, such as fresh fruits, vegetables, beans, and whole grains. Talk with your dietitian about how many servings of carbs you can eat at each meal.  Eat 4-6 oz (112-168 g) of lean protein each day, such as lean meat, chicken, fish, eggs, or tofu. One ounce (oz) of lean protein is equal to: ? 1 oz (28 g) of meat, chicken, or fish. ? 1 egg. ?  cup (62 g) of tofu.  Eat some foods each day that contain healthy fats, such as avocado, nuts, seeds, and fish.   What foods should I eat? Fruits Berries. Apples. Oranges. Peaches. Apricots. Plums. Grapes. Mango. Papaya. Pomegranate. Kiwi. Cherries. Vegetables Lettuce. Spinach. Leafy greens, including kale, chard, collard greens, and mustard greens. Beets. Cauliflower. Cabbage. Broccoli. Carrots. Green beans. Tomatoes. Peppers. Onions. Cucumbers. Brussels sprouts. Grains Whole grains, such as whole-wheat or whole-grain bread, crackers, tortillas, cereal, and pasta. Unsweetened oatmeal. Quinoa. Brown or wild rice. Meats and other proteins Seafood. Poultry without skin. Lean cuts of poultry and beef. Tofu. Nuts. Seeds. Dairy Low-fat or fat-free dairy products such as milk, yogurt, and cheese. The items listed above may not be a complete list of foods and beverages you can eat. Contact a dietitian for more information. What foods should I avoid? Fruits Fruits canned with  syrup. Vegetables Canned vegetables. Frozen vegetables with butter or cream sauce. Grains Refined white flour and flour products such as bread, pasta, snack foods, and cereals. Avoid all processed foods. Meats and other proteins Fatty cuts of meat. Poultry with skin. Breaded or fried meats. Processed meat. Avoid saturated fats. Dairy Full-fat yogurt, cheese, or milk. Beverages Sweetened drinks, such as soda or iced tea. The items listed above may not be a complete list of foods and beverages you should avoid. Contact a dietitian for more information. Questions to ask a health care provider  Do I need to meet with a diabetes educator?  Do I need to meet with a dietitian?  What number can I call if I have questions?  When are the best times to check my blood glucose? Where to find more information:  American Diabetes Association: diabetes.org  Academy of Nutrition and Dietetics: www.eatright.org  National Institute of Diabetes and Digestive and Kidney Diseases: www.niddk.nih.gov  Association of Diabetes Care and Education Specialists: www.diabeteseducator.org Summary  It is important to have healthy eating   habits because your blood sugar (glucose) levels are greatly affected by what you eat and drink.  A healthy meal plan will help you control your blood glucose and maintain a healthy lifestyle.  Your health care provider may recommend that you work with a dietitian to make a meal plan that is best for you.  Keep in mind that carbohydrates (carbs) and alcohol have immediate effects on your blood glucose levels. It is important to count carbs and to use alcohol carefully. This information is not intended to replace advice given to you by your health care provider. Make sure you discuss any questions you have with your health care provider. Document Revised: 11/18/2019 Document Reviewed: 11/18/2019 Elsevier Patient Education  2021 [employer]  

## 2021-02-10 NOTE — Progress Notes (Signed)
Established Patient Office Visit  Subjective:  Patient ID: [name], male    DOB: 12/05/1951  Age: 70 y.o. MRN: [id number]  CC:  Chief Complaint  Patient presents with   Diabetes   Hypertension   Hyperlipidemia    HPI [name] presents for follow-up on diabetes mellitus, hypertension and hyperlipidemia. Today has complaints of a recurring skin infection underneath left axilla.  States when treated for cellulitis with antibiotic it also helped clear an inflamed skin boil which has reoccurred.  Has been applying a topical antibiotic cream.  Denies fever, chills or night sweats.  Diabetes: Pt denies increased urination or thirst. Pt reports medication compliance.  States has not been checking glucose at home because difficult to prick his finger due to thick skin.  In the past was using freestyle libre and unfortunately his mother threw away his device and would like to get another one.  Reports has decreased ice cream and doughnuts.  Initially cut back on blueberry muffins but recently has been having 1 daily with his coffee.  Also received a cortisone shot of left shoulder few weeks ago and reports increased stress.  HTN: Pt denies chest pain, palpitations, dizziness or increase swelling from baseline.  Is wearing compression socks which help with the swelling. Taking medication as directed without side effects. Checks BP at home and readings average 130/70. Pt follows a low salt diet. Tries to stay hydrated.  HLD: Pt taking medication as directed without issues.  Reports has reduced fast food.    Past Medical History:  Diagnosis Date   Abdominal aortic aneurysm (AAA) (HCC)    3 cm per 07-02-18 US abdominal aorta US epic   Arthritis    Blood clot in vein    behind left knee   Chronic kidney disease    renal calculi   Chronic venous insufficiency LOWER EXTREMITIES   Coronary artery disease CARDIOLOGIST - DR  [id number]-  LAST 1 WK AGO -- WILL REQUEST NOTE AND  STRESS TEST   Diabetes mellitus without complication ([place])    type 2   Hematuria    last year   Hyperglycemia    Hyperlipidemia    Hypertension    Mixed dyslipidemia    Myocardial infarction ([place])    Neuropathy    toes only   Nocturia    Prostate cancer ([place]) 08/18/11   gleason 8, volume 24.4cc   S/P CABG x 4    ST elevation MI (STEMI) ([place]) 02-10-2008   S/P CABG   Stroke ([place]) 2016   occ trouble wriing with right hand   Urinary hesitancy    Vision abnormalities    resolved now   Wound discharge    since jan 2019 goes to wound center [place] left great toe small open area changes dressing q 2 days with ointment provided by wound center, clear drainage occ    Past Surgical History:  Procedure Laterality Date   CARDIAC CATHETERIZATION  05/01/2012   grafts widely patent   CARDIOVASCULAR STRESS TEST  03-15-2010   INFERIOR WALL SCAR WITHOUT ANY MEANINGFUL ISCHEMIA/ EF 46% / LOW RISK SCAN   CATARACT EXTRACTION     CORONARY ARTERY BYPASS GRAFT  02-10-2008  DR Einar Gip   X4 VESSEL DISEASE / EMERGANT CABG   CYSTOSCOPY  02/08/2012   Procedure: CYSTOSCOPY FLEXIBLE;  Surgeon: Franchot Gallo, MD;  Location: [facility];  Service: Urology;;   CYSTOSCOPY WITH RETROGRADE PYELOGRAM, URETEROSCOPY AND STENT PLACEMENT Right 09/12/2018  Procedure: CYSTOSCOPY WITH RETROGRADE PYELOGRAM, URETEROSCOPY AND STENT PLACEMENT, STONE EXTRACTION;  Surgeon: Franchot Gallo, MD;  Location: [facility];  Service: Urology;  Laterality: Right;   EP IMPLANTABLE DEVICE N/A 08/14/2016   Procedure: Loop Recorder Insertion;  Surgeon: Evans Lance, MD;  Location: [facility];  Service: Cardiovascular;  Laterality: N/A;   EXTRACORPOREAL SHOCK WAVE LITHOTRIPSY  2013   HOLMIUM LASER APPLICATION Right 03/28/7095   Procedure: HOLMIUM LASER APPLICATION;  Surgeon: Franchot Gallo, MD;  Location: [facility];  Service: Urology;  Laterality: Right;   KNEE SURGERY  age 61   RIGHT    LEFT HEART CATHETERIZATION WITH CORONARY/GRAFT ANGIOGRAM N/A 05/01/2012   Procedure: LEFT HEART CATHETERIZATION WITH Beatrix Fetters;  Surgeon: Sanda Klein, MD;  Location: [facility];  Service: Cardiovascular;  Laterality: N/A;   MULTIPLE TEETH EXTRACTIONS (23)/ FOUR QUADRANT ALVEOLPLASTY/ MANDIBULAR LATERAL EXOSTOSES REDUCTIONS  03-24-2010   CHRONIC PERIODONTITIS   RADIOACTIVE SEED IMPLANT  02/08/2012   Procedure: RADIOACTIVE SEED IMPLANT;  Surgeon: Franchot Gallo, MD;  Location: [facility];  Service: Urology;  Laterality: N/A;  C-ARM    SHOULDER SURGERY  1982   LEFT   TEE WITHOUT CARDIOVERSION N/A 08/14/2016   Procedure: TRANSESOPHAGEAL ECHOCARDIOGRAM (TEE);  Surgeon: Josue Hector, MD;  Location: [facility];  Service: Cardiovascular;  Laterality: N/A;   URETERAL STENT PLACEMENT      Family History  Problem Relation Age of Onset   Cancer Father        pancreatic   Diabetes Father     Social History   Socioeconomic History   Marital status: Married    Spouse name: Not on file   Number of children: Not on file   Years of education: Not on file   Highest education level: Not on file  Occupational History   Not on file  Tobacco Use   Smoking status: Current Every Day Smoker    Packs/day: 1.00    Years: 50.00    Pack years: 50.00    Types: Cigarettes   Smokeless tobacco: Never Used   Tobacco comment: PREVIOUSLY SMOKED 3PPD / DECREASED TO 1PPD SINCE 2009  Vaping Use   Vaping Use: Never used  Substance and Sexual Activity   Alcohol use: Not Currently    Alcohol/week: 2.0 standard drinks    Types: 1 Cans of beer, 1 Shots of liquor per week    Comment: none in 3 years   Drug use: No   Sexual activity: Never  Other Topics Concern   Not on file  Social History Narrative   Not on file   Social Determinants of Health   Financial Resource Strain: Not on file  Food Insecurity: Not on file  Transportation Needs: Not on  file  Physical Activity: Not on file  Stress: Not on file  Social Connections: Not on file  Intimate Partner Violence: Not on file    Outpatient Medications Prior to Visit  Medication Sig Dispense Refill   apixaban (ELIQUIS) 5 MG TABS tablet Take 1 tablet (5 mg total) by mouth 2 (two) times daily. 180 tablet 1   atorvastatin (LIPITOR) 10 MG tablet Take 1 tablet by mouth once daily 90 tablet 0   blood glucose meter kit and supplies KIT Dispense based on patient and insurance preference. 1 each 0   gabapentin (NEURONTIN) 300 MG capsule TAKE 2 CAPSULES BY MOUTH IN THE MORNING AND 2 CAPSULES IN THE EVENING AT DINNER TIME 360 capsule 0   glucose blood (  FREESTYLE LITE) test strip Use to check blood sugars every morning fasting 100 each 12   lisinopril (ZESTRIL) 5 MG tablet Take 1/2 (one-half) tablet by mouth once daily 45 tablet 2   metFORMIN (GLUCOPHAGE-XR) 500 MG 24 hr tablet TAKE 4 TABLETS BY MOUTH ONCE DAILY WITH BREAKFAST 720 tablet 0   Multiple Vitamin (MULTIVITAMIN WITH MINERALS) TABS tablet Take 1 tablet by mouth daily. Men's One-A-Day Multivitamin     tamsulosin (FLOMAX) 0.4 MG CAPS capsule Take 1 capsule by mouth once daily at bedtime 90 capsule 0   WELLBUTRIN SR 150 MG 12 hr tablet Take 1 tablet (150 mg total) by mouth 2 (two) times daily. 60 tablet 3   Continuous Blood Gluc Receiver (FREESTYLE LIBRE 14 DAY READER) DEVI 1 Device by Does not apply route daily. Use to check blood sugars every morning and 2 hours after largest meals 1 Device 0   Continuous Blood Gluc Sensor (FREESTYLE LIBRE 14 DAY SENSOR) MISC USE TO CHECK BLOOD SUGARS IN THE MORNING FASTING AND  2  HOURS  AFTER  LARGEST  MEAL 6 each 0   finasteride (PROSCAR) 5 MG tablet Take 5 mg by mouth daily. (Patient not taking: Reported on 02/10/2021)  11   furosemide (LASIX) 20 MG tablet Take 1 tablet (20 mg total) by mouth daily. (Patient not taking: Reported on 02/10/2021) 6 tablet 0   doxycycline (VIBRA-TABS) 100 MG  tablet Take 1 tablet (100 mg total) by mouth 2 (two) times daily. (Patient not taking: Reported on 02/10/2021) 20 tablet 0   No facility-administered medications prior to visit.    Allergies  Allergen Reactions   Bee Venom Anaphylaxis   Oysters [Shellfish Allergy] Swelling    Swelling of hands   Penicillins Other (See Comments)    CAUSES "FREE BLEEDING" Has patient had a PCN reaction causing immediate rash, facial/tongue/throat swelling, SOB or lightheadedness with hypotension: No Has patient had a PCN reaction causing severe rash involving mucus membranes or skin necrosis: No Has patient had a PCN reaction that required hospitalization No Has patient had a PCN reaction occurring within the last 10 years: No If all of the above answers are "NO", then may proceed with Cephalosporin use.    ROS Review of Systems A fourteen system review of systems was performed and found to be positive as per HPI.   Objective:    Physical Exam General:  Well Developed, well nourished, appropriate for stated age.  Neuro:  Alert and oriented,  extra-ocular muscles intact  HEENT:  Normocephalic, atraumatic, neck supple Skin:  Approximately 1 x 2 cm erythematous lump with slight fluctuance underneath left axilla   Cardiac:  RRR, S1 S2 Respiratory:  ECTA B/L w/o wheezing, Not using accessory muscles, speaking in full sentences- unlabored. Vascular:  Ext warm, no cyanosis apprec.;  1+ pitting edema  Psych:  No HI/SI, judgement and insight good, Euthymic mood. Full Affect.  BP 132/75    Pulse 88    Temp (!) 97.4 F (36.3 C)    Ht 5' 11"  (1.803 m)    Wt 221 lb 12.8 oz (100.6 kg)    SpO2 98%    BMI 30.93 kg/m  Wt Readings from Last 3 Encounters:  02/10/21 221 lb 12.8 oz (100.6 kg)  10/20/20 229 lb 14.4 oz (104.3 kg)  10/04/20 227 lb (103 kg)     Health Maintenance Due  Topic Date Due   PNA vac Low Risk Adult (2 of 2 - PPSV23) 11/27/2018   COVID-19 Vaccine (  3 - Pfizer risk 4-dose series)  03/01/2020   OPHTHALMOLOGY EXAM  05/04/2020    There are no preventive care reminders to display for this patient.  Lab Results  Component Value Date   TSH 1.930 02/07/2021   Lab Results  Component Value Date   WBC 10.8 02/07/2021   HGB 14.8 02/07/2021   HCT 45.4 02/07/2021   MCV 84 02/07/2021   PLT 271 02/07/2021   Lab Results  Component Value Date   NA 141 02/07/2021   K 4.2 02/07/2021   CO2 23 02/07/2021   GLUCOSE 145 (H) 02/07/2021   BUN 11 02/07/2021   CREATININE 0.69 (L) 02/07/2021   BILITOT 0.5 02/07/2021   ALKPHOS 123 (H) 02/07/2021   AST 14 02/07/2021   ALT 13 02/07/2021   PROT 6.9 02/07/2021   ALBUMIN 4.4 02/07/2021   CALCIUM 9.4 02/07/2021   ANIONGAP 11 09/04/2018   Lab Results  Component Value Date   CHOL 92 (L) 02/07/2021   Lab Results  Component Value Date   HDL 30 (L) 02/07/2021   Lab Results  Component Value Date   LDLCALC 44 02/07/2021   Lab Results  Component Value Date   TRIG 94 02/07/2021   Lab Results  Component Value Date   CHOLHDL 3.1 02/07/2021   Lab Results  Component Value Date   HGBA1C 8.1 (H) 02/07/2021      Assessment & Plan:   Problem List Items Addressed This Visit      Cardiovascular and Mediastinum   Hypertension associated with diabetes ([place])   Relevant Medications   canagliflozin (INVOKANA) 300 MG TABS tablet     Endocrine   Diabetes mellitus type 2 in obese (HCC) - Primary   Relevant Medications   canagliflozin (INVOKANA) 300 MG TABS tablet   Continuous Blood Gluc Receiver (FREESTYLE LIBRE 14 DAY READER) DEVI   Continuous Blood Gluc Sensor (FREESTYLE LIBRE 14 DAY SENSOR) MISC   Mixed diabetic hyperlipidemia associated with type 2 diabetes mellitus (HCC)   Relevant Medications   canagliflozin (INVOKANA) 300 MG TABS tablet    Other Visit Diagnoses    Wound cellulitis       Cutaneous abscess, unspecified site       Relevant Medications   doxycycline (VIBRA-TABS) 100 MG tablet   Other Relevant Orders    Ambulatory referral to Dermatology     Diabetes mellitus type 2 in obese: -Discussed with patient most recent A1c increased from 6.6 to 8.1 and recommend medication adjustments. Patient is agreeable to start SGLT2, Invokana is preferred vs Jardiance per insurance. Recent GFR 98. Recommend to reduce sweets/sugar and stay as active as possible. -Continue Metformin 2000 mg/day. -Will send new rx for Freestyle Libre to resume ambulatory glucose monitoring. -Will continue to monitor and repeat A1c in 3 months.  Hypertension associated with diabetes: -Fairly controlled. -Continue current medication regimen. -Follow low sodium diet and try to stay hydrated. -Recent CMP: renal function stable, electrolytes wnl -Will continue to monitor.  Mixed diabetic hyperlipidemia associated with type 2 diabetes mellitus: -Recent lipid panel: Total cholesterol 92, triglycerides 94, HDL 40, LDL 44 (at goal<70) -Continue current medication regimen.  Recent ALT normal -Recommend to follow a heart healthy diet low in saturated and transfats.  Cutaneous abscess unspecified site: -Discussed with patient concerned about possible HS and recommend referral to dermatology. Patient is agreeable.  -Will start antibiotic therapy with doxycycline.  -Recommend to keep area clean and dry. -Recent CBC stable, WBC 10.8  Meds ordered this encounter  Medications   canagliflozin (INVOKANA) 300 MG TABS tablet    Sig: Take 1 tablet (300 mg total) by mouth daily before breakfast.    Dispense:  90 tablet    Refill:  0   doxycycline (VIBRA-TABS) 100 MG tablet    Sig: Take 1 tablet (100 mg total) by mouth 2 (two) times daily.    Dispense:  20 tablet    Refill:  0   Continuous Blood Gluc Receiver (FREESTYLE LIBRE 14 DAY READER) DEVI    Sig: 1 Device by Does not apply route daily. Use to check blood sugars every morning and 2 hours after largest meals    Dispense:  1 each    Refill:  1   Continuous Blood Gluc Sensor  (FREESTYLE LIBRE 14 DAY SENSOR) MISC    Sig: USE TO CHECK BLOOD SUGARS IN THE MORNING FASTING AND  2  HOURS  AFTER  LARGEST  MEAL    Dispense:  6 each    Refill:  0    Follow-up: Return in about 3 months (around 05/10/2021) for DM.   Note:  This note was prepared with assistance of Dragon voice recognition software. Occasional wrong-word or sound-a-like substitutions may have occurred due to the inherent limitations of voice recognition software.  [name], PA-C

## 2021-02-10 NOTE — Telephone Encounter (Signed)
Patient called for a referral appointment from Dr. Mariel Kansky, but does not want to wait until July 2022, so would like referral sent back to Dr. Mariel Kansky.

## 2021-02-15 DIAGNOSIS — L738 Other specified follicular disorders: Secondary | ICD-10-CM | POA: Diagnosis not present

## 2021-03-15 ENCOUNTER — Encounter: Payer: Self-pay | Admitting: Cardiovascular Disease

## 2021-03-15 ENCOUNTER — Ambulatory Visit: Payer: PPO | Admitting: Cardiovascular Disease

## 2021-03-15 ENCOUNTER — Other Ambulatory Visit: Payer: Self-pay | Admitting: Physician Assistant

## 2021-03-15 ENCOUNTER — Other Ambulatory Visit: Payer: Self-pay

## 2021-03-15 VITALS — BP 138/76 | HR 69 | Ht 71.0 in | Wt 221.2 lb

## 2021-03-15 DIAGNOSIS — Z7901 Long term (current) use of anticoagulants: Secondary | ICD-10-CM

## 2021-03-15 DIAGNOSIS — I480 Paroxysmal atrial fibrillation: Secondary | ICD-10-CM | POA: Diagnosis not present

## 2021-03-15 DIAGNOSIS — Z720 Tobacco use: Secondary | ICD-10-CM | POA: Diagnosis not present

## 2021-03-15 DIAGNOSIS — E785 Hyperlipidemia, unspecified: Secondary | ICD-10-CM

## 2021-03-15 DIAGNOSIS — I2510 Atherosclerotic heart disease of native coronary artery without angina pectoris: Secondary | ICD-10-CM | POA: Diagnosis not present

## 2021-03-15 DIAGNOSIS — I739 Peripheral vascular disease, unspecified: Secondary | ICD-10-CM

## 2021-03-15 DIAGNOSIS — E1169 Type 2 diabetes mellitus with other specified complication: Secondary | ICD-10-CM

## 2021-03-15 DIAGNOSIS — E669 Obesity, unspecified: Secondary | ICD-10-CM | POA: Diagnosis not present

## 2021-03-15 DIAGNOSIS — I872 Venous insufficiency (chronic) (peripheral): Secondary | ICD-10-CM | POA: Diagnosis not present

## 2021-03-15 DIAGNOSIS — I10 Essential (primary) hypertension: Secondary | ICD-10-CM | POA: Diagnosis not present

## 2021-03-15 MED ORDER — APIXABAN 5 MG PO TABS: 5.0000 mg | ORAL_TABLET | Freq: Two times a day (BID) | ORAL | 3 refills | Status: DC

## 2021-03-15 NOTE — Progress Notes (Signed)
 Cardiology Office Note   Date:  03/15/2021   ID:  [name], DOB 11/15/1951, MRN [id number]  PCP:  Abonza, Maritza, PA-C  Cardiologist:  Mihai Croitoru, MD  Electrophysiologist:  None   Evaluation Performed:  Follow-Up Visit  Chief Complaint:  CAD, AFib  History of Present Illness:    [name] is a 70 y.o. male with coronary artery disease, CABG 4 in 2009, prior inferior wall infarction, hypertension, hyperlipidemia, type 2 diabetes mellitus, venous insufficiency of the lower extremities, previous right frontal cortical infarct, PAD of lower extremities, asymptomatic atrial fibrillation detected by implantable loop recorder.  Doing well from a cardiovascular point of view.  He has moved into take care of his mother, who is 93 years old and is now receiving hospice care.  He will sometimes get short of breath when he has to lift her.  Otherwise physical activity is limited more by right knee pain, rather than shortness of breath.  He has never been aware of palpitations when he had atrial fibrillation.  He has not had any falls, injuries or bleeding problems.  He complains of some cramping in his right calf, but this occurs both at rest and with walking.  He has chronic swelling in both lower extremities, right worse than left, due to peripheral venous insufficiency.  He is wearing knee-high compression stockings.  Still has some deep indentation at the top of the calf.  The patient specifically denies any chest pain at rest or with exertion, dyspnea at rest, orthopnea, paroxysmal nocturnal dyspnea, syncope, palpitations, focal neurological deficits, typical symptoms of intermittent claudication, lower extremity edema, unexplained weight gain, cough, hemoptysis or wheezing.  Has paresthesias typical of diabetic neuropathy in both feet.  Still smoking between half a pack and a whole pack a day.  The stress and anxiety related to taking care of his mother make it hard for him to  quit.  Before the battery reached end of service, his loop recorder which showed occasional episodes of brief paroxysmal atrial fibrillation (overall burden less than 1%), consistently asymptomatic.  He has significant arterial disease in the left leg, ABI 0.77, occlusion of SFA, brief reconstitution and occlusion of mid popliteal and tibioperoneal trunk and peroneal, posterior tibial mid-calf reconstitution (duplex US 02/2019, unchanged in 02/2020).  He had a vascular screen performed in August 2019 that did not show any evidence of focal stenoses in the aorta or iliac arteries, mild abdominal aortic ectasia with a maximum diameter of 2.4 cm.  He did not have any meaningful carotid stenoses by ultrasound in 2017.  Glycemic control was mediocre with a hemoglobin A1c of 8.1%.  His medicine list contains both Metformin and Invokana, but he does not think he is taking the latter medication.  He does his best to avoid high sugar foods and beverages.  Lipid profile shows an excellent LDL at 44, but his HDL is chronically in stubbornly low at only 30.  He has prominent abdominal obesity, even though BMI is only about 30.  Past Medical History:  Diagnosis Date  . Abdominal aortic aneurysm (AAA) (HCC)    3 cm per 07-02-18 us abdominal aorta us epic  . Arthritis   . Blood clot in vein    behind left knee  . Chronic kidney disease    renal calculi  . Chronic venous insufficiency LOWER EXTREMITIES  . Coronary artery disease CARDIOLOGIST - DR  CROITORU-  LAST 1 WK AGO -- WILL REQUEST NOTE AND STRESS TEST  .   Diabetes mellitus without complication (HCC)    type 2  . Hematuria    last year  . Hyperglycemia   . Hyperlipidemia   . Hypertension   . Mixed dyslipidemia   . Myocardial infarction (HCC)   . Neuropathy    toes only  . Nocturia   . Prostate cancer (HCC) 08/18/11   gleason 8, volume 24.4cc  . S/P CABG x 4   . ST elevation MI (STEMI) (HCC) 02-10-2008   S/P CABG  . Stroke (HCC) 2016   occ  trouble wriing with right hand  . Urinary hesitancy   . Vision abnormalities    resolved now  . Wound discharge    since jan 2019 goes to wound center [place] left great toe small open area changes dressing q 2 days with ointment provided by wound center, clear drainage occ   Past Surgical History:  Procedure Laterality Date  . CARDIAC CATHETERIZATION  05/01/2012   grafts widely patent  . CARDIOVASCULAR STRESS TEST  03-15-2010   INFERIOR WALL SCAR WITHOUT ANY MEANINGFUL ISCHEMIA/ EF 46% / LOW RISK SCAN  . CATARACT EXTRACTION    . CORONARY ARTERY BYPASS GRAFT  02-10-2008  DR GANJI   X4 VESSEL DISEASE / EMERGANT CABG  . CYSTOSCOPY  02/08/2012   Procedure: CYSTOSCOPY FLEXIBLE;  Surgeon: Stephen Dahlstedt, MD;  Location: [facility];  Service: Urology;;  . CYSTOSCOPY WITH RETROGRADE PYELOGRAM, URETEROSCOPY AND STENT PLACEMENT Right 09/12/2018   Procedure: CYSTOSCOPY WITH RETROGRADE PYELOGRAM, URETEROSCOPY AND STENT PLACEMENT, STONE EXTRACTION;  Surgeon: Dahlstedt, Stephen, MD;  Location: [facility];  Service: Urology;  Laterality: Right;  . EP IMPLANTABLE DEVICE N/A 08/14/2016   Procedure: Loop Recorder Insertion;  Surgeon: Gregg W Taylor, MD;  Location: [facility];  Service: Cardiovascular;  Laterality: N/A;  . EXTRACORPOREAL SHOCK WAVE LITHOTRIPSY  2013  . HOLMIUM LASER APPLICATION Right 09/12/2018   Procedure: HOLMIUM LASER APPLICATION;  Surgeon: Dahlstedt, Stephen, MD;  Location: [facility];  Service: Urology;  Laterality: Right;  . KNEE SURGERY  age 13   RIGHT  . LEFT HEART CATHETERIZATION WITH CORONARY/GRAFT ANGIOGRAM N/A 05/01/2012   Procedure: LEFT HEART CATHETERIZATION WITH CORONARY/GRAFT ANGIOGRAM;  Surgeon: Mihai Croitoru, MD;  Location: [facility];  Service: Cardiovascular;  Laterality: N/A;  . MULTIPLE TEETH EXTRACTIONS (23)/ FOUR QUADRANT ALVEOLPLASTY/ MANDIBULAR LATERAL EXOSTOSES REDUCTIONS  03-24-2010   CHRONIC PERIODONTITIS  . RADIOACTIVE SEED IMPLANT  02/08/2012    Procedure: RADIOACTIVE SEED IMPLANT;  Surgeon: Stephen Dahlstedt, MD;  Location: [facility];  Service: Urology;  Laterality: N/A;  C-ARM   . SHOULDER SURGERY  1982   LEFT  . TEE WITHOUT CARDIOVERSION N/A 08/14/2016   Procedure: TRANSESOPHAGEAL ECHOCARDIOGRAM (TEE);  Surgeon: Peter C Nishan, MD;  Location: [facility];  Service: Cardiovascular;  Laterality: N/A;  . URETERAL STENT PLACEMENT       Current Meds  Medication Sig  . atorvastatin (LIPITOR) 10 MG tablet Take 1 tablet by mouth once daily  . blood glucose meter kit and supplies KIT Dispense based on patient and insurance preference.  . canagliflozin (INVOKANA) 300 MG TABS tablet Take 1 tablet (300 mg total) by mouth daily before breakfast.  . Continuous Blood Gluc Receiver (FREESTYLE LIBRE 14 DAY READER) DEVI 1 Device by Does not apply route daily. Use to check blood sugars every morning and 2 hours after largest meals  . Continuous Blood Gluc Sensor (FREESTYLE LIBRE 14 DAY SENSOR) MISC USE TO CHECK BLOOD SUGARS IN THE MORNING FASTING   AND  2  HOURS  AFTER  LARGEST  MEAL  . doxycycline (VIBRA-TABS) 100 MG tablet Take 1 tablet (100 mg total) by mouth 2 (two) times daily.  . finasteride (PROSCAR) 5 MG tablet Take 5 mg by mouth daily.  . furosemide (LASIX) 20 MG tablet Take 1 tablet (20 mg total) by mouth daily.  Marland Kitchen gabapentin (NEURONTIN) 300 MG capsule TAKE 2 CAPSULES BY MOUTH IN THE MORNING AND 2 CAPSULES IN THE EVENING AT DINNER TIME  . glucose blood (FREESTYLE LITE) test strip Use to check blood sugars every morning fasting  . lisinopril (ZESTRIL) 5 MG tablet Take 1/2 (one-half) tablet by mouth once daily  . metFORMIN (GLUCOPHAGE-XR) 500 MG 24 hr tablet TAKE 4 TABLETS BY MOUTH ONCE DAILY WITH BREAKFAST  . Multiple Vitamin (MULTIVITAMIN WITH MINERALS) TABS tablet Take 1 tablet by mouth daily. Men's One-A-Day Multivitamin  . tamsulosin (FLOMAX) 0.4 MG CAPS capsule Take 1 capsule by mouth once daily at bedtime  .  WELLBUTRIN SR 150 MG 12 hr tablet Take 1 tablet (150 mg total) by mouth 2 (two) times daily.  . [DISCONTINUED] apixaban (ELIQUIS) 5 MG TABS tablet Take 1 tablet (5 mg total) by mouth 2 (two) times daily.     Allergies:   Bee venom and Penicillins   Social History   Tobacco Use  . Smoking status: Current Every Day Smoker    Packs/day: 1.00    Years: 50.00    Pack years: 50.00    Types: Cigarettes  . Smokeless tobacco: Never Used  . Tobacco comment: PREVIOUSLY SMOKED 3PPD / DECREASED TO 1PPD SINCE 2009  Vaping Use  . Vaping Use: Never used  Substance Use Topics  . Alcohol use: Not Currently    Alcohol/week: 2.0 standard drinks    Types: 1 Cans of beer, 1 Shots of liquor per week    Comment: none in 3 years  . Drug use: No     Family Hx: The patient's family history includes Cancer in his father; Diabetes in his father.  ROS:   Please see the history of present illness.     All other systems reviewed and are negative.   Prior CV studies:   The following studies were reviewed today:  ILR download   Labs/Other Tests and Data Reviewed:    EKG: Performed today shows normal sinus rhythm with probable left atrial enlargement, new right bundle branch block (in the past had incomplete right bundle branch block) and Q waves in leads II, aVF and V3-V6 which are unchanged from previous tracings.  QTc 441 ms.  No acute repolarization abnormalities to suggest ischemia  Recent Labs: 02/07/2021: ALT 13; BUN 11; Creatinine, Ser 0.69; Hemoglobin 14.8; Platelets 271; Potassium 4.2; Sodium 141; TSH 1.930   Recent Lipid Panel Lab Results  Component Value Date/Time   CHOL 92 (L) 02/07/2021 08:16 AM   TRIG 94 02/07/2021 08:16 AM   HDL 30 (L) 02/07/2021 08:16 AM   CHOLHDL 3.1 02/07/2021 08:16 AM   CHOLHDL 6.7 08/12/2016 12:48 AM   LDLCALC 44 02/07/2021 08:16 AM    Wt Readings from Last 3 Encounters:  03/15/21 221 lb 3.2 oz (100.3 kg)  02/10/21 221 lb 12.8 oz (100.6 kg)  10/20/20 229  lb 14.4 oz (104.3 kg)     Objective:    Vital Signs:  BP 138/76   Pulse 69   Ht 5' 11" (1.803 m)   Wt 221 lb 3.2 oz (100.3 kg)   SpO2 99%   BMI  30.85 kg/m     General: Alert, oriented x3, no distress, morbidly obese Head: no evidence of trauma, PERRL, EOMI, no exophtalmos or lid lag, no myxedema, no xanthelasma; normal ears, nose and oropharynx Neck: normal jugular venous pulsations and no hepatojugular reflux; brisk carotid pulses without delay and no carotid bruits Chest: clear to auscultation, no signs of consolidation by percussion or palpation, normal fremitus, symmetrical and full respiratory excursions Cardiovascular: normal position and quality of the apical impulse, regular rhythm, normal first and second heart sounds, no murmurs, rubs or gallops Abdomen: no tenderness or distention, no masses by palpation, no abnormal pulsatility or arterial bruits, normal bowel sounds, no hepatosplenomegaly Extremities: no clubbing, cyanosis or edema; 2+ radial, ulnar and brachial pulses bilaterally; 2+ right femoral, posterior tibial and dorsalis pedis pulses; 2+ left femoral, posterior tibial and dorsalis pedis pulses; no subclavian or femoral bruits Neurological: grossly nonfocal Psych: Normal mood and affect   ASSESSMENT & PLAN:    1. Paroxysmal atrial fibrillation (HCC)   2. Long term (current) use of anticoagulants   3. Coronary artery disease involving native coronary artery of native heart without angina pectoris   4. Essential hypertension   5. Dyslipidemia (high LDL; low HDL)   6. Diabetes mellitus type 2 in obese (HCC)   7. Tobacco abuse   8. Obesity, mild   9. Peripheral venous insufficiency   10. PAD (peripheral artery disease) (HCC)             1. AFib:  Asymptomatic and very low prevalence.  No need for rate control/antiarrhythmics.  On anticoagulation.  CHADSVasc 5 (CVA, HTN, DM, CAD).  2. Eliquis:  No bleeding complications or falls.  Past problems with hematuria  due to nephrolithiasis, not seen recently. 3. CAD s/p CABG:  Denies angina pectoris.  Activity limited by knee pain. 4. HTN:  Good control.  He did not tolerate higher doses of ACE inhibitor. 5. HLP:  Excellent LDL.  Advised weight loss to improve the HDL. 6. DM:  Control has deteriorated, A1c has gone from 7.3 to 8.1% since he moved in with his mom. 7. Tobacco abuse:  Hard for him to try to quit smoking now due to the emotional involvement with his mom's condition.  Still, I pointed out that this is a critical long-term goal for him. 8. Obesity:  Losing weight, but remains mildly obese.  Encourage additional weight loss.  He has typical ventral adiposity consistent with a metabolic syndrome/insulin resistance. 9. ILR:    Device is at end of service. 10. Venous insufficiency of the lower extremities:  May do better with thigh-high compression stockings. 11. PAD LLE: occlusion SFA and significant infrapopliteal disease.  Symptoms do not sound like true intermittent claudication.  Scheduled for another arterial ultrasound on April 4.  Saw Dr. Todd Early on April 15, 2019, conservative management recommended for now.     COVID-19 Education: The signs and symptoms of COVID-19 were discussed with the patient and how to seek care for testing (follow up with PCP or arrange E-visit).  The importance of social distancing was discussed today.  Time:   Today, I have spent 15 minutes with the patient with telehealth technology discussing the above problems.     Medication Adjustments/Labs and Tests Ordered: Current medicines are reviewed at length with the patient today.  Concerns regarding medicines are outlined above.   Tests Ordered: Orders Placed This Encounter  Procedures  . EKG 12-Lead    Medication Changes: Meds ordered this encounter    Medications  . apixaban (ELIQUIS) 5 MG TABS tablet    Sig: Take 1 tablet (5 mg total) by mouth 2 (two) times daily.    Dispense:  180 tablet    Refill:  3     Follow Up:  In Person 1 year  Signed, Sanda Klein, MD  03/15/2021 10:14 AM    [place]

## 2021-03-15 NOTE — Patient Instructions (Signed)
Medication Instructions:  No changes  Eliquis samples given: 2 boxes [id number] *If you need a refill on your cardiac medications before your next appointment, please call your pharmacy*   Lab Work: None ordered If you have labs (blood work) drawn today and your tests are completely normal, you will receive your results only by: Marland Kitchen MyChart Message (if you have MyChart) OR . A paper copy in the mail If you have any lab test that is abnormal or we need to change your treatment, we will call you to review the results.   Testing/Procedures: None ordered   Follow-Up: At [facility], you and your health needs are our priority.  As part of our continuing mission to provide you with exceptional heart care, we have created designated Provider Care Teams.  These Care Teams include your primary Cardiologist (physician) and Advanced Practice Providers (APPs -  Physician Assistants and Nurse Practitioners) who all work together to provide you with the care you need, when you need it.  We recommend signing up for the patient portal called "MyChart".  Sign up information is provided on this After Visit Summary.  MyChart is used to connect with patients for Virtual Visits (Telemedicine).  Patients are able to view lab/test results, encounter notes, upcoming appointments, etc.  Non-urgent messages can be sent to your provider as well.   To learn more about what you can do with MyChart, go to [email or web address].    Your next appointment:   12 month(s)  The format for your next appointment:   In Person  Provider:   You may see Sanda Klein, MD or one of the following Advanced Practice Providers on your designated Care Team:    Almyra Deforest, PA-C  Fabian Sharp, PA-C or   Roby Lofts, [place]  Wear compression stockings during the day.   Other Instructions  Steps to Quit Smoking Smoking tobacco is the leading cause of preventable death. It can affect almost every organ in the body. Smoking  puts you and those around you at risk for developing many serious chronic diseases. Quitting smoking can be difficult, but it is one of the best things that you can do for your health. It is never too late to quit. How do I get ready to quit? When you decide to quit smoking, create a plan to help you succeed. Before you quit:  Pick a date to quit. Set a date within the next 2 weeks to give you time to prepare.  Write down the reasons why you are quitting. Keep this list in places where you will see it often.  Tell your family, friends, and co-workers that you are quitting. Support from your loved ones can make quitting easier.  Talk with your health care provider about your options for quitting smoking.  Find out what treatment options are covered by your health insurance.  Identify people, places, things, and activities that make you want to smoke (triggers). Avoid them. What first steps can I take to quit smoking?  Throw away all cigarettes at home, at work, and in your car.  Throw away smoking accessories, such as Scientist, research (medical).  Clean your car. Make sure to empty the ashtray.  Clean your home, including curtains and carpets. What strategies can I use to quit smoking? Talk with your health care provider about combining strategies, such as taking medicines while you are also receiving in-person counseling. Using these two strategies together makes you more likely to succeed in quitting than if you  used either strategy on its own.  If you are pregnant or breastfeeding, talk with your health care provider about finding counseling or other support strategies to quit smoking. Do not take medicine to help you quit smoking unless your health care provider tells you to do so. To quit smoking: Quit right away  Quit smoking completely, instead of gradually reducing how much you smoke over a period of time. Research shows that stopping smoking right away is more successful than  gradually quitting.  Attend in-person counseling to help you build problem-solving skills. You are more likely to succeed in quitting if you attend counseling sessions regularly. Even short sessions of 10 minutes can be effective. Take medicine You may take medicines to help you quit smoking. Some medicines require a prescription and some you can purchase over-the-counter. Medicines may have nicotine in them to replace the nicotine in cigarettes. Medicines may:  Help to stop cravings.  Help to relieve withdrawal symptoms. Your health care provider may recommend:  Nicotine patches, gum, or lozenges.  Nicotine inhalers or sprays.  Non-nicotine medicine that is taken by mouth. Find resources Find resources and support systems that can help you to quit smoking and remain smoke-free after you quit. These resources are most helpful when you use them often. They include:  Online chats with a Social worker.  Telephone quitlines.  Printed Furniture conservator/restorer.  Support groups or group counseling.  Text messaging programs.  Mobile phone apps or applications. Use apps that can help you stick to your quit plan by providing reminders, tips, and encouragement. There are many free apps for mobile devices as well as websites. Examples include Quit Guide from the [employer] and smokefree.gov   What things can I do to make it easier to quit?  Reach out to your family and friends for support and encouragement. Call telephone quitlines (1-800-QUIT-NOW), reach out to support groups, or work with a counselor for support.  Ask people who smoke to avoid smoking around you.  Avoid places that trigger you to smoke, such as bars, parties, or smoke-break areas at work.  Spend time with people who do not smoke.  Lessen the stress in your life. Stress can be a smoking trigger for some people. To lessen stress, try: ? Exercising regularly. ? Doing deep-breathing exercises. ? Doing yoga. ? Meditating. ? Performing  a body scan. This involves closing your eyes, scanning your body from head to toe, and noticing which parts of your body are particularly tense. Try to relax the muscles in those areas.   How will I feel when I quit smoking? Day 1 to 3 weeks Within the first 24 hours of quitting smoking, you may start to feel withdrawal symptoms. These symptoms are usually most noticeable 2-3 days after quitting, but they usually do not last for more than 2-3 weeks. You may experience these symptoms:  Mood swings.  Restlessness, anxiety, or irritability.  Trouble concentrating.  Dizziness.  Strong cravings for sugary foods and nicotine.  Mild weight gain.  Constipation.  Nausea.  Coughing or a sore throat.  Changes in how the medicines that you take for unrelated issues work in your body.  Depression.  Trouble sleeping (insomnia). Week 3 and afterward After the first 2-3 weeks of quitting, you may start to notice more positive results, such as:  Improved sense of smell and taste.  Decreased coughing and sore throat.  Slower heart rate.  Lower blood pressure.  Clearer skin.  The ability to breathe more easily.  Fewer sick days. Quitting smoking can be very challenging. Do not get discouraged if you are not successful the first time. Some people need to make many attempts to quit before they achieve long-term success. Do your best to stick to your quit plan, and talk with your health care provider if you have any questions or concerns. Summary  Smoking tobacco is the leading cause of preventable death. Quitting smoking is one of the best things that you can do for your health.  When you decide to quit smoking, create a plan to help you succeed.  Quit smoking right away, not slowly over a period of time.  When you start quitting, seek help from your health care provider, family, or friends. This information is not intended to replace advice given to you by your health care provider.  Make sure you discuss any questions you have with your health care provider. Document Revised: 09/05/2019 Document Reviewed: 03/01/2019 Elsevier Patient Education  [place].

## 2021-03-18 ENCOUNTER — Other Ambulatory Visit: Payer: Self-pay | Admitting: Cardiovascular Disease

## 2021-03-18 DIAGNOSIS — I739 Peripheral vascular disease, unspecified: Secondary | ICD-10-CM

## 2021-03-27 ENCOUNTER — Other Ambulatory Visit: Payer: Self-pay

## 2021-03-27 ENCOUNTER — Emergency Department (HOSPITAL_BASED_OUTPATIENT_CLINIC_OR_DEPARTMENT_OTHER): Payer: PPO

## 2021-03-27 ENCOUNTER — Emergency Department (HOSPITAL_BASED_OUTPATIENT_CLINIC_OR_DEPARTMENT_OTHER)
Admission: EM | Admit: 2021-03-27 | Discharge: 2021-03-27 | Disposition: A | Discharge status: Home / Self Care | Payer: PPO | Attending: Emergency Medicine | Admitting: Emergency Medicine

## 2021-03-27 ENCOUNTER — Encounter (HOSPITAL_BASED_OUTPATIENT_CLINIC_OR_DEPARTMENT_OTHER): Payer: Self-pay | Admitting: *Deleted

## 2021-03-27 DIAGNOSIS — Y9389 Activity, other specified: Secondary | ICD-10-CM | POA: Diagnosis not present

## 2021-03-27 DIAGNOSIS — Z951 Presence of aortocoronary bypass graft: Secondary | ICD-10-CM | POA: Insufficient documentation

## 2021-03-27 DIAGNOSIS — F17210 Nicotine dependence, cigarettes, uncomplicated: Secondary | ICD-10-CM | POA: Diagnosis not present

## 2021-03-27 DIAGNOSIS — W19XXXA Unspecified fall, initial encounter: Secondary | ICD-10-CM

## 2021-03-27 DIAGNOSIS — Y9289 Other specified places as the place of occurrence of the external cause: Secondary | ICD-10-CM | POA: Diagnosis not present

## 2021-03-27 DIAGNOSIS — W010XXA Fall on same level from slipping, tripping and stumbling without subsequent striking against object, initial encounter: Secondary | ICD-10-CM | POA: Diagnosis not present

## 2021-03-27 DIAGNOSIS — I119 Hypertensive heart disease without heart failure: Secondary | ICD-10-CM | POA: Diagnosis not present

## 2021-03-27 DIAGNOSIS — M5459 Other low back pain: Secondary | ICD-10-CM | POA: Insufficient documentation

## 2021-03-27 DIAGNOSIS — Z7901 Long term (current) use of anticoagulants: Secondary | ICD-10-CM | POA: Insufficient documentation

## 2021-03-27 DIAGNOSIS — Y999 Unspecified external cause status: Secondary | ICD-10-CM | POA: Insufficient documentation

## 2021-03-27 DIAGNOSIS — R109 Unspecified abdominal pain: Secondary | ICD-10-CM | POA: Diagnosis not present

## 2021-03-27 DIAGNOSIS — M545 Low back pain, unspecified: Secondary | ICD-10-CM

## 2021-03-27 DIAGNOSIS — K429 Umbilical hernia without obstruction or gangrene: Secondary | ICD-10-CM | POA: Diagnosis not present

## 2021-03-27 DIAGNOSIS — N200 Calculus of kidney: Secondary | ICD-10-CM | POA: Diagnosis not present

## 2021-03-27 DIAGNOSIS — I480 Paroxysmal atrial fibrillation: Secondary | ICD-10-CM | POA: Diagnosis not present

## 2021-03-27 DIAGNOSIS — I77811 Abdominal aortic ectasia: Secondary | ICD-10-CM | POA: Diagnosis not present

## 2021-03-27 DIAGNOSIS — Z7984 Long term (current) use of oral hypoglycemic drugs: Secondary | ICD-10-CM | POA: Diagnosis not present

## 2021-03-27 DIAGNOSIS — I2510 Atherosclerotic heart disease of native coronary artery without angina pectoris: Secondary | ICD-10-CM | POA: Diagnosis not present

## 2021-03-27 DIAGNOSIS — K449 Diaphragmatic hernia without obstruction or gangrene: Secondary | ICD-10-CM | POA: Diagnosis not present

## 2021-03-27 DIAGNOSIS — E1142 Type 2 diabetes mellitus with diabetic polyneuropathy: Secondary | ICD-10-CM | POA: Diagnosis not present

## 2021-03-27 MED ORDER — METHOCARBAMOL 500 MG PO TABS: 500.0000 mg | ORAL_TABLET | Freq: Three times a day (TID) | ORAL | 0 refills | Status: DC | PRN

## 2021-03-27 MED ORDER — DICLOFENAC SODIUM 1 % EX GEL: 2.0000 g | Freq: Four times a day (QID) | CUTANEOUS | 0 refills | Status: DC | PRN

## 2021-03-27 NOTE — ED Triage Notes (Signed)
Pt reports bilateral back pain since falling last night. States that he tripped over a cord in his home. Denies hitting head. Abrasion and bruising to right elbow.

## 2021-03-27 NOTE — ED Provider Notes (Signed)
Emergency Department Provider Note   I have reviewed the triage vital signs and the nursing notes.   HISTORY  Chief Complaint Fall   HPI [name] is a 70 y.o. male on Eliquis presents to the ED after fall with diffuse lower back pain and bruising to the right elbow. Patient reports minimal pain and full ROM of the right elbow. Back pain is diffuse and not radiating down the legs. No groin numbness. No weakness/numbness in the legs. No bowel/bladder symptoms. No fever. Denies and head injury. No pre-syncope symptoms prior to falling and notes he tripped on something while walking.   Past Medical History:  Diagnosis Date  . Abdominal aortic aneurysm (AAA) ([place])    3 cm per 07-02-18 US abdominal aorta US epic  . Arthritis   . Blood clot in vein    behind left knee  . Chronic kidney disease    renal calculi  . Chronic venous insufficiency LOWER EXTREMITIES  . Coronary artery disease CARDIOLOGIST - DR  [id number]-  LAST 1 WK AGO -- WILL REQUEST NOTE AND STRESS TEST  . Diabetes mellitus without complication ([place])    type 2  . Hematuria    last year  . Hyperglycemia   . Hyperlipidemia   . Hypertension   . Mixed dyslipidemia   . Myocardial infarction ([place])   . Neuropathy    toes only  . Nocturia   . Prostate cancer ([place]) 08/18/11   gleason 8, volume 24.4cc  . S/P CABG x 4   . ST elevation MI (STEMI) ([place]) 02-10-2008   S/P CABG  . Stroke [facility]) 2016   occ trouble wriing with right hand  . Urinary hesitancy   . Vision abnormalities    resolved now  . Wound discharge    since jan 2019 goes to wound center [place] left great toe small open area changes dressing q 2 days with ointment provided by wound center, clear drainage occ    Patient Active Problem List   Diagnosis Date Noted  . Microalbuminuria due to type 2 diabetes mellitus ([place]) 04/19/2020  . Mixed diabetic hyperlipidemia associated with type 2 diabetes mellitus ([place]) 04/19/2020  . Diabetic polyneuropathy  associated with type 2 diabetes mellitus ([place]) 04/19/2020  . Wound of left leg 09/11/2019  . Tick bite of right lower leg 04/21/2019  . Dyslipidemia (high LDL; low HDL) 01/28/2019  . PAD (peripheral artery disease) ([place]) 01/28/2019  . Great toe pain, left 10/21/2018  . Encounter for Medicare annual wellness exam 06/20/2018  . Laceration of left lower leg 04/17/2018  . Laceration of skin of left lower leg 04/01/2018  . Increased urinary frequency 04/01/2018  . Hematuria 01/30/2018  . Puncture wound of toe of left foot 01/07/2018  . At risk for diabetic foot ulcer 01/07/2018  . Need for pneumococcal vaccination 11/27/2017  . Screening for colon cancer 11/27/2017  . Healthcare maintenance 11/27/2017  . Paroxysmal atrial fibrillation ([place]) 11/22/2016  . History of embolic stroke 40/09/2724  . Diabetes mellitus type 2 in obese ([place]) 11/22/2016  . Venous insufficiency of both lower extremities 09/21/2016  . Stroke ([place]) 08/12/2016  . Hyperglycemia   . Hypertension associated with diabetes ([place])   . Vision, loss, sudden   . CVA (cerebral infarction) 08/11/2016  . CAD - inferior MI 2009, CABG 2009, patent grafts cath 04/2012 09/23/2013  . Morbid obesity ([place]) 09/23/2013  . Hyperlipidemia 09/23/2013  . Tobacco abuse 09/23/2013  . Prostate cancer ([place]) 08/18/2011  . Myocardial  infarction [facility])     Past Surgical History:  Procedure Laterality Date  . CARDIAC CATHETERIZATION  05/01/2012   grafts widely patent  . CARDIOVASCULAR STRESS TEST  03-15-2010   INFERIOR WALL SCAR WITHOUT ANY MEANINGFUL ISCHEMIA/ EF 46% / LOW RISK SCAN  . CATARACT EXTRACTION    . CORONARY ARTERY BYPASS GRAFT  02-10-2008  DR Einar Gip   X4 VESSEL DISEASE / [facility] CABG  . CYSTOSCOPY  02/08/2012   Procedure: CYSTOSCOPY FLEXIBLE;  Surgeon: Franchot Gallo, MD;  Location: [facility];  Service: Urology;;  . [place], URETEROSCOPY AND STENT PLACEMENT Right 09/12/2018    Procedure: CYSTOSCOPY WITH RETROGRADE PYELOGRAM, URETEROSCOPY AND STENT PLACEMENT, STONE EXTRACTION;  Surgeon: Franchot Gallo, MD;  Location: [facility];  Service: Urology;  Laterality: Right;  . EP IMPLANTABLE DEVICE N/A 08/14/2016   Procedure: Loop Recorder Insertion;  Surgeon: Evans Lance, MD;  Location: [place] CV LAB;  Service: Cardiovascular;  Laterality: N/A;  . EXTRACORPOREAL SHOCK WAVE LITHOTRIPSY  2013  . HOLMIUM LASER APPLICATION Right 2/42/3536   Procedure: HOLMIUM LASER APPLICATION;  Surgeon: Franchot Gallo, MD;  Location: [facility];  Service: Urology;  Laterality: Right;  . KNEE SURGERY  age 49   RIGHT  . LEFT HEART CATHETERIZATION WITH CORONARY/GRAFT ANGIOGRAM N/A 05/01/2012   Procedure: LEFT HEART CATHETERIZATION WITH Beatrix Fetters;  Surgeon: Sanda Klein, MD;  Location: [place] CATH LAB;  Service: Cardiovascular;  Laterality: N/A;  . MULTIPLE TEETH EXTRACTIONS (23)/ FOUR QUADRANT ALVEOLPLASTY/ MANDIBULAR LATERAL EXOSTOSES REDUCTIONS  03-24-2010   CHRONIC PERIODONTITIS  . RADIOACTIVE SEED IMPLANT  02/08/2012   Procedure: RADIOACTIVE SEED IMPLANT;  Surgeon: Franchot Gallo, MD;  Location: [facility];  Service: Urology;  Laterality: N/A;  C-ARM   . SHOULDER SURGERY  1982   LEFT  . TEE WITHOUT CARDIOVERSION N/A 08/14/2016   Procedure: TRANSESOPHAGEAL ECHOCARDIOGRAM (TEE);  Surgeon: Josue Hector, MD;  Location: [facility]COPY;  Service: Cardiovascular;  Laterality: N/A;  . URETERAL STENT PLACEMENT      Allergies Bee venom and Penicillins  Family History  Problem Relation Age of Onset  . Cancer Father        pancreatic  . Diabetes Father     Social History Social History   Tobacco Use  . Smoking status: Current Every Day Smoker    Packs/day: 1.00    Years: 50.00    Pack years: 50.00    Types: Cigarettes  . Smokeless tobacco: Never Used  . Tobacco comment: PREVIOUSLY SMOKED 3PPD / DECREASED TO 1PPD SINCE 2009  Vaping Use  . Vaping  Use: Never used  Substance Use Topics  . Alcohol use: Not Currently    Alcohol/week: 2.0 standard drinks    Types: 1 Cans of beer, 1 Shots of liquor per week    Comment: none in 3 years  . Drug use: No    Review of Systems  Constitutional: No fever/chills Eyes: No visual changes. ENT: No sore throat. Cardiovascular: Denies chest pain. Respiratory: Denies shortness of breath. Gastrointestinal: No abdominal pain.  No nausea, no vomiting.  No diarrhea.  No constipation. Genitourinary: Negative for dysuria. Musculoskeletal: Positive for back pain. Skin: Negative for rash. Positive right elbow bruise. No laceration.  Neurological: Negative for headaches, focal weakness or numbness.  10-point ROS otherwise negative.  ____________________________________________   PHYSICAL EXAM:  VITAL SIGNS: ED Triage Vitals  Enc Vitals Group     BP 03/27/21 0815 (!) 141/73     Pulse Rate 03/27/21 0815  75     Resp 03/27/21 0815 18     Temp 03/27/21 0815 97.9 F (36.6 C)     Temp Source 03/27/21 0815 Oral     SpO2 03/27/21 0815 97 %     Weight 03/27/21 0816 214 lb (97.1 kg)     Height 03/27/21 0816 5\' 10"  (1.778 m)   Constitutional: Alert and oriented. Well appearing and in no acute distress. Eyes: Conjunctivae are normal.  Head: Atraumatic. Nose: No congestion/rhinnorhea. Mouth/Throat: Mucous membranes are moist.  Neck: No stridor. No cervical spine tenderness to palpation. Cardiovascular: Normal rate, regular rhythm. Good peripheral circulation. Grossly normal heart sounds.   Respiratory: Normal respiratory effort.  No retractions. Lungs CTAB. Gastrointestinal: Soft and nontender. No distention.  Musculoskeletal: No lower extremity tenderness nor edema. No gross deformities of extremities. Neurologic:  Normal speech and language. No gross focal neurologic deficits are appreciated. Normal sensation and strength in the bilateral lower extremities.  Skin:  Skin is warm, dry and intact. No  rash noted.   ____________________________________________  RADIOLOGY  CT renal reviewed.   ____________________________________________   PROCEDURES  Procedure(s) performed:   Procedures  None ____________________________________________   INITIAL IMPRESSION / ASSESSMENT AND PLAN / ED COURSE  Pertinent labs & imaging results that were available during my care of the patient were reviewed by me and considered in my medical decision making (see chart for details).   Patient presents to the ED with back pain after mechanical fall. No leg pain. Bruising to the right elbow but no bony tenderness and normal ROM. No indication for imaging. Patient is anticoagulated. Plan for CT renal to assess for bony abnormality in the spine (fracture) and evaluate for obvious retroperitoneal hematoma.   CT imaging reviewed. No acute findings on scan. Clinically seems most consistent with lumbar strain. Plan for symptom mgmt and PCP follow up. Patient advised that on anticoagulation her cannot take meds such as Aleve.    ____________________________________________  FINAL CLINICAL IMPRESSION(S) / ED DIAGNOSES  Final diagnoses:  Fall, initial encounter  Acute bilateral low back pain without sciatica    NEW OUTPATIENT MEDICATIONS STARTED DURING THIS VISIT:  Discharge Medication List as of 03/27/2021 10:19 AM    START taking these medications   Details  diclofenac Sodium (VOLTAREN) 1 % GEL Apply 2 g topically 4 (four) times daily as needed., Starting Sun 03/27/2021, Normal    methocarbamol (ROBAXIN) 500 MG tablet Take 1 tablet (500 mg total) by mouth every 8 (eight) hours as needed for muscle spasms., Starting Sun 03/27/2021, Normal        Note:  This document was prepared using Dragon voice recognition software and may include unintentional dictation errors.  Nanda Quinton, MD, [facility]    Keyion Knack, [facility], MD 03/29/21 [phone]

## 2021-03-27 NOTE — Discharge Instructions (Signed)
You were seen in the ED with back pain. Your CT scan is looking good. Take the medication as needed for pain.  If you develop worsening pain in your back, numbness in your legs, or numbness in your groin, peeing or pooping on yourself you should return to the emergency department.

## 2021-03-28 ENCOUNTER — Ambulatory Visit (HOSPITAL_COMMUNITY)
Admission: RE | Admit: 2021-03-28 | Discharge: 2021-03-28 | Disposition: A | Discharge status: Home / Self Care | Payer: PPO | Source: Ambulatory Visit | Attending: Cardiology | Admitting: Cardiology

## 2021-03-28 DIAGNOSIS — I739 Peripheral vascular disease, unspecified: Secondary | ICD-10-CM | POA: Insufficient documentation

## 2021-03-29 ENCOUNTER — Other Ambulatory Visit: Payer: Self-pay | Admitting: *Deleted

## 2021-03-29 DIAGNOSIS — I739 Peripheral vascular disease, unspecified: Secondary | ICD-10-CM

## 2021-04-11 ENCOUNTER — Telehealth: Payer: Self-pay | Admitting: *Deleted

## 2021-04-11 NOTE — Telephone Encounter (Signed)
Please restart wearing the compression stocking

## 2021-04-11 NOTE — Telephone Encounter (Signed)
Spoke with the patient about his results. He stated that he is still having right leg cramping at rest and ambulation that does hurt worse when he ambulates.   He had stopped wearing his compression stockings when he fell and got hurt but did say that the cramping felt better when he wore the stockings. He has been advised to try and start wearing them again.  Recent CMET in February was within his normal limit.

## 2021-04-11 NOTE — Telephone Encounter (Signed)
Patient made aware and restart wearing them.

## 2021-04-11 NOTE — Telephone Encounter (Signed)
-----   Message from Sanda Klein, MD sent at 03/29/2021  8:56 AM EDT ----- Unchanged moderate obstruction left side leg artery. Please repeat in 12 months

## 2021-05-05 ENCOUNTER — Other Ambulatory Visit: Payer: Self-pay | Admitting: Physician Assistant

## 2021-05-05 DIAGNOSIS — E1142 Type 2 diabetes mellitus with diabetic polyneuropathy: Secondary | ICD-10-CM

## 2021-05-16 ENCOUNTER — Ambulatory Visit (INDEPENDENT_AMBULATORY_CARE_PROVIDER_SITE_OTHER): Payer: PPO | Admitting: Physician Assistant

## 2021-05-16 ENCOUNTER — Encounter: Payer: Self-pay | Admitting: Physician Assistant

## 2021-05-16 ENCOUNTER — Other Ambulatory Visit: Payer: Self-pay

## 2021-05-16 VITALS — BP 126/73 | HR 72 | Temp 98.2°F | Ht 70.0 in | Wt 228.5 lb

## 2021-05-16 DIAGNOSIS — I152 Hypertension secondary to endocrine disorders: Secondary | ICD-10-CM | POA: Diagnosis not present

## 2021-05-16 DIAGNOSIS — E782 Mixed hyperlipidemia: Secondary | ICD-10-CM

## 2021-05-16 DIAGNOSIS — E1169 Type 2 diabetes mellitus with other specified complication: Secondary | ICD-10-CM

## 2021-05-16 DIAGNOSIS — C61 Malignant neoplasm of prostate: Secondary | ICD-10-CM | POA: Diagnosis not present

## 2021-05-16 DIAGNOSIS — E1159 Type 2 diabetes mellitus with other circulatory complications: Secondary | ICD-10-CM | POA: Diagnosis not present

## 2021-05-16 DIAGNOSIS — E669 Obesity, unspecified: Secondary | ICD-10-CM | POA: Diagnosis not present

## 2021-05-16 DIAGNOSIS — R600 Localized edema: Secondary | ICD-10-CM

## 2021-05-16 DIAGNOSIS — E1129 Type 2 diabetes mellitus with other diabetic kidney complication: Secondary | ICD-10-CM | POA: Diagnosis not present

## 2021-05-16 DIAGNOSIS — R809 Proteinuria, unspecified: Secondary | ICD-10-CM | POA: Diagnosis not present

## 2021-05-16 LAB — POCT UA - MICROALBUMIN
Albumin/Creatinine Ratio, Urine, POC: >300
Creatinine, POC: 200 mg/dL
Microalbumin Ur, POC: 150 mg/L

## 2021-05-16 LAB — POCT GLYCOSYLATED HEMOGLOBIN (HGB A1C): Hemoglobin A1C: 7.1 % — AB (ref 4.0–5.6)

## 2021-05-16 MED ORDER — LISINOPRIL 5 MG PO TABS: 5.0000 mg | ORAL_TABLET | Freq: Every day | ORAL | 1 refills | Status: DC

## 2021-05-16 NOTE — Patient Instructions (Addendum)
Microalbumin Test Why am I having this test? Albumin is a protein in the body that helps regulate how much fluid is in the blood. Normally, when the kidneys filter waste from the bloodstream, waste leaves the body through urine, and important substances, such as albumin, remain in the blood. If the kidneys are damaged, they may no longer be able to keep albumin in the blood. This leads to small amounts of albumin (microalbumin, MA) in the urine. You may have this test:  If you have signs of kidney damage, such as foamy urine or swelling in the hands, feet, abdomen, or face.  To help evaluate kidney function after other kidney test results have been normal.  To monitor treatment for diabetes, also called diabetes mellitus, especially if your blood sugar (glucose) has been poorly controlled or you have signs of kidney damage. MA in the urine is a common complication of diabetes.  To monitor for complications caused by high blood pressure (hypertension).  To help diagnose cardiovascular disease or a heart attack. What is being tested? This test measures the amount of MA in your urine. You may have your creatinine level tested at the same time as MA testing. Creatinine is a waste product that the kidneys filter out of the blood. Testing creatinine levels provides more information about kidney function. What kind of sample is taken? A urine sample is required for this test. You may be asked to collect urine samples at home over a period of 24 hours.   How do I collect samples at home? When collecting a urine sample at home, make sure you:  Use supplies and instructions that you received from the lab.  Collect urine only in the germ-free (sterile) cup that you received from the lab.  Do not let any toilet paper or stool (feces) get into the cup.  Refrigerate the sample until you can return it to the lab.  Return the sample(s) to the lab as instructed. How do I prepare for this test? Follow  instructions from your health care provider about changing or stopping your regular medicines. This is especially important if you are taking diabetes medicines or blood thinners. Tell a health care provider about:  Any medical conditions you have.  All medicines you are taking, including vitamins, herbs, eye drops, creams, and over-the-counter medicines.  Whether you are pregnant or may be pregnant. How are the results reported? Your test results will be reported as a value that indicates how much MA is in your urine. This may be given as:  Milligrams of MA per deciliter of urine (mg/dL).  Milligrams of MA per gram of creatinine (mg/g creatinine), if creatinine was also tested. Your health care provider will compare your results to normal ranges that were established after testing a large group of people (reference ranges). Reference ranges may vary among labs and hospitals. For this test, common reference ranges are:  MA only: 0-2 mg/dL.  MA and creatinine: ? Men: 0-17 mg/g creatinine. ? Women: 0-25 mg/g creatinine. What do the results mean? A result that is within the reference range means that you have a normal amount of MA in your urine. This may mean that:  Your kidneys are functioning normally.  Your diabetes or hypertension treatment is working effectively. Results that are higher than the reference range may mean that you have:  Poorly controlled diabetes. Your treatment plan may need to be adjusted.  Narrowing of the arteries caused by plaque buildup (atherosclerosis).  Kidney damage caused  by hypertension or diabetes (nephropathy). Talk with your health care provider about what your results mean. Questions to ask your health care provider Ask your health care provider, or the department that is doing the test:  When will my results be ready?  How will I get my results?  What are my treatment options?  What other tests do I need?  What are my next  steps? Summary  Albumin is a protein that helps regulate how much fluid is in your blood.  If your kidneys are damaged, they may no longer be able to keep albumin in your blood. This leads to small amounts of albumin (microalbumin, MA) in your urine.  Results showing high MA levels may indicate nephropathy, which is kidney damage from diabetes or high blood pressure.  Talk with your health care provider about what your results mean. This information is not intended to replace advice given to you by your health care provider. Make sure you discuss any questions you have with your health care provider. Document Revised: 03/05/2020 Document Reviewed: 03/05/2020 Elsevier Patient Education  [place].   Diabetes Mellitus and Standards of Medical Care Living with and managing diabetes (diabetes mellitus) can be complicated. Your diabetes treatment may be managed by a team of health care providers, including:  A physician who specializes in diabetes (endocrinologist). You might also have visits with a nurse practitioner or physician assistant.  Nurses.  A registered dietitian.  A certified diabetes care and education specialist.  An exercise specialist.  A pharmacist.  An eye doctor.  A foot specialist (podiatrist).  A dental care provider.  A primary care provider.  A mental health care provider. How to manage your diabetes You can do many things to successfully manage your diabetes. Your health care providers will follow guidelines to help you get the best quality of care. Here are general guidelines for your diabetes management plan. Your health care providers may give you more specific instructions. Physical exams When you are diagnosed with diabetes, and each year after that, your health care provider will ask about your medical and family history. You will have a physical exam, which may include:  Measuring your height, weight, and body mass index  (BMI).  Checking your blood pressure. This will be done at every routine medical visit. Your target blood pressure may vary depending on your medical conditions, your age, and other factors.  A thyroid exam.  A skin exam.  Screening for nerve damage (peripheral neuropathy). This may include checking the pulse in your legs and feet and the level of sensation in your hands and feet.  A foot exam to inspect the structure and skin of your feet, including checking for cuts, bruises, redness, blisters, sores, or other problems.  Screening for blood vessel (vascular) problems. This may include checking the pulse in your legs and feet and checking your temperature. Blood tests Depending on your treatment plan and your personal needs, you may have the following tests:  Hemoglobin A1C (HbA1C). This test provides information about blood sugar (glucose) control over the previous 2-3 months. It is used to adjust your treatment plan, if needed. This test will be done: ? At least 2 times a year, if you are meeting your treatment goals. ? 4 times a year, if you are not meeting your treatment goals or if your goals have changed.  Lipid testing, including total cholesterol, LDL and HDL cholesterol, and triglyceride levels. ? The goal for LDL is less than 100  mg/dL (5.5 mmol/L). If you are at high risk for complications, the goal is less than 70 mg/dL (3.9 mmol/L). ? The goal for HDL is 40 mg/dL (2.2 mmol/L) or higher for men, and 50 mg/dL (2.8 mmol/L) or higher for women. An HDL cholesterol of 60 mg/dL (3.3 mmol/L) or higher gives some protection against heart disease. ? The goal for triglycerides is less than 150 mg/dL (8.3 mmol/L).  Liver function tests.  Kidney function tests.  Thyroid function tests.   Dental and eye exams  Visit your dentist two times a year.  If you have type 1 diabetes, your health care provider may recommend an eye exam within 5 years after you are diagnosed, and then once a  year after your first exam. ? For children with type 1 diabetes, the health care provider may recommend an eye exam when your child is age 110 or older and has had diabetes for 3-5 years. After the first exam, your child should get an eye exam once a year.  If you have type 2 diabetes, your health care provider may recommend an eye exam as soon as you are diagnosed, and then every 1-2 years after your first exam.   Immunizations  A yearly flu (influenza) vaccine is recommended annually for everyone 6 months or older. This is especially important if you have diabetes.  The pneumonia (pneumococcal) vaccine is recommended for everyone 2 years or older who has diabetes. If you are age 22 or older, you may get the pneumonia vaccine as a series of two separate shots.  The hepatitis B vaccine is recommended for adults shortly after being diagnosed with diabetes. Adults and children with diabetes should receive all other vaccines according to age-specific recommendations from the Centers for Disease Control and Prevention (CDC). Mental and emotional health Screening for symptoms of eating disorders, anxiety, and depression is recommended at the time of diagnosis and after as needed. If your screening shows that you have symptoms, you may need more evaluation. You may work with a mental health care provider. Follow these instructions at home: Treatment plan You will monitor your blood glucose levels and may give yourself insulin. Your treatment plan will be reviewed at every medical visit. You and your health care provider will discuss:  How you are taking your medicines, including insulin.  Any side effects you have.  Your blood glucose level target goals.  How often you monitor your blood glucose level.  Lifestyle habits, such as activity level and tobacco, alcohol, and substance use. Education Your health care provider will assess how well you are monitoring your blood glucose levels and whether  you are taking your insulin and medicines correctly. He or she may refer you to:  A certified diabetes care and education specialist to manage your diabetes throughout your life, starting at diagnosis.  A registered dietitian who can create and review your personal nutrition plan.  An exercise specialist who can discuss your activity level and exercise plan. General instructions  Take over-the-counter and prescription medicines only as told by your health care provider.  Keep all follow-up visits. This is important. Where to find support There are many diabetes support networks, including:  American Diabetes Association (ADA): diabetes.org  Defeat Diabetes Foundation: defeatdiabetes.org Where to find more information  American Diabetes Association (ADA): www.diabetes.org  Association of Diabetes Care & Education Specialists (ADCES): diabeteseducator.org  International Diabetes Federation (IDF): https://www.munoz-bell.org/ Summary  Managing diabetes (diabetes mellitus) can be complicated. Your diabetes treatment may be managed by  a team of health care providers.  Your health care providers follow guidelines to help you get the best quality care.  You should have physical exams, blood tests, blood pressure monitoring, immunizations, and screening tests regularly. Stay updated on how to manage your diabetes.  Your health care providers may also give you more specific instructions based on your individual health. This information is not intended to replace advice given to you by your health care provider. Make sure you discuss any questions you have with your health care provider. Document Revised: 06/17/2020 Document Reviewed: 06/17/2020 Elsevier Patient Education  [place].

## 2021-05-16 NOTE — Progress Notes (Signed)
Established Patient Office Visit  Subjective:  Patient ID: [name], male    DOB: Apr 13, 1951  Age: 70 y.o. MRN: [id number]  CC:  Chief Complaint  Patient presents with  . Follow-up  . Diabetes    HPI [name] presents for follow up on diabetes mellitus, hypertension and hyperlipidemia.  Diabetes: Pt denies increased urination or thirst from baseline. Pt reports taking Metformin. Unsure if taking Invokana. No hypoglycemic events. Checking glucose at home. FBS range 90-110s. States has tried to monitor carbohydrate and glucose intake. However, works at the [employer] and yesterday ate a significant amount of strawberries. Has reduced muffins. Unfortunately, his mother passed away about a month ago. Caregiver stress has decreased.  HTN: Pt denies chest pain, palpitations, dizziness or shortness of breath. Continues to have edema, states has not been wearing his compression socks and has not had muscle cramps. Taking medication as directed without side effects. Checks BP at home occasionally. States blood pressure readings are <135/85. Pt follows a low salt diet.   HLD: Pt taking medication as directed without issues. Reports is trying to watch what he is eating but has had tenderloin and sausage biscuits. Right knee has been bothering him which limits activity level. Plans to see his orthopedist.   Prostate cancer: Patient reports has not returned to see his urologist. When treated for prostate cancer he was uninsured and received a high bill which he was not happy. Musician did not check insurance status before proceeding with treatment.   Past Medical History:  Diagnosis Date  . Abdominal aortic aneurysm (AAA) ([place])    3 cm per 07-02-18 US abdominal aorta US epic  . Arthritis   . Blood clot in vein    behind left knee  . Chronic kidney disease    renal calculi  . Chronic venous insufficiency LOWER EXTREMITIES  . Coronary artery disease CARDIOLOGIST - DR   QJJHERDE-  LAST 1 WK AGO -- WILL REQUEST NOTE AND STRESS TEST  . Diabetes mellitus without complication ([place])    type 2  . Hematuria    last year  . Hyperglycemia   . Hyperlipidemia   . Hypertension   . Mixed dyslipidemia   . Myocardial infarction ([place])   . Neuropathy    toes only  . Nocturia   . Prostate cancer ([place]) 08/18/11   gleason 8, volume 24.4cc  . S/P CABG x 4   . ST elevation MI (STEMI) ([place]) 02-10-2008   S/P CABG  . Stroke [facility]) 2016   occ trouble wriing with right hand  . Urinary hesitancy   . Vision abnormalities    resolved now  . Wound discharge    since jan 2019 goes to wound center [facility] left great toe small open area changes dressing q 2 days with ointment provided by wound center, clear drainage occ    Past Surgical History:  Procedure Laterality Date  . CARDIAC CATHETERIZATION  05/01/2012   grafts widely patent  . CARDIOVASCULAR STRESS TEST  03-15-2010   INFERIOR WALL SCAR WITHOUT ANY MEANINGFUL ISCHEMIA/ EF 46% / LOW RISK SCAN  . CATARACT EXTRACTION    . CORONARY ARTERY BYPASS GRAFT  02-10-2008  DR Einar Gip   X4 VESSEL DISEASE / [facility] CABG  . CYSTOSCOPY  02/08/2012   Procedure: CYSTOSCOPY FLEXIBLE;  Surgeon: Franchot Gallo, MD;  Location: [facility];  Service: Urology;;  . [place], URETEROSCOPY AND STENT PLACEMENT Right 09/12/2018   Procedure: CYSTOSCOPY  WITH RETROGRADE PYELOGRAM, URETEROSCOPY AND STENT PLACEMENT, STONE EXTRACTION;  Surgeon: Franchot Gallo, MD;  Location: [facility];  Service: Urology;  Laterality: Right;  . EP IMPLANTABLE DEVICE N/A 08/14/2016   Procedure: Loop Recorder Insertion;  Surgeon: Evans Lance, MD;  Location: [place] CV LAB;  Service: Cardiovascular;  Laterality: N/A;  . EXTRACORPOREAL SHOCK WAVE LITHOTRIPSY  2013  . HOLMIUM LASER APPLICATION Right 0/13/1438   Procedure: HOLMIUM LASER APPLICATION;  Surgeon: Franchot Gallo, MD;  Location: [facility];  Service: Urology;   Laterality: Right;  . KNEE SURGERY  age 37   RIGHT  . LEFT HEART CATHETERIZATION WITH CORONARY/GRAFT ANGIOGRAM N/A 05/01/2012   Procedure: LEFT HEART CATHETERIZATION WITH Beatrix Fetters;  Surgeon: Sanda Klein, MD;  Location: [place] CATH LAB;  Service: Cardiovascular;  Laterality: N/A;  . MULTIPLE TEETH EXTRACTIONS (23)/ FOUR QUADRANT ALVEOLPLASTY/ MANDIBULAR LATERAL EXOSTOSES REDUCTIONS  03-24-2010   CHRONIC PERIODONTITIS  . RADIOACTIVE SEED IMPLANT  02/08/2012   Procedure: RADIOACTIVE SEED IMPLANT;  Surgeon: Franchot Gallo, MD;  Location: [facility];  Service: Urology;  Laterality: N/A;  C-ARM   . SHOULDER SURGERY  1982   LEFT  . TEE WITHOUT CARDIOVERSION N/A 08/14/2016   Procedure: TRANSESOPHAGEAL ECHOCARDIOGRAM (TEE);  Surgeon: Josue Hector, MD;  Location: [facility] ENDOSCOPY;  Service: Cardiovascular;  Laterality: N/A;  . URETERAL STENT PLACEMENT      Family History  Problem Relation Age of Onset  . Cancer Father        pancreatic  . Diabetes Father     Social History   Socioeconomic History  . Marital status: Married    Spouse name: Not on file  . Number of children: Not on file  . Years of education: Not on file  . Highest education level: Not on file  Occupational History  . Not on file  Tobacco Use  . Smoking status: Current Every Day Smoker    Packs/day: 1.00    Years: 50.00    Pack years: 50.00    Types: Cigarettes  . Smokeless tobacco: Never Used  . Tobacco comment: PREVIOUSLY SMOKED 3PPD / DECREASED TO 1PPD SINCE 2009  Vaping Use  . Vaping Use: Never used  Substance and Sexual Activity  . Alcohol use: Not Currently    Alcohol/week: 2.0 standard drinks    Types: 1 Cans of beer, 1 Shots of liquor per week    Comment: none in 3 years  . Drug use: No  . Sexual activity: Never  Other Topics Concern  . Not on file  Social History Narrative  . Not on file   Social Determinants of Health   Financial Resource Strain: Not on file  Food  Insecurity: Not on file  Transportation Needs: Not on file  Physical Activity: Not on file  Stress: Not on file  Social Connections: Not on file  Intimate Partner Violence: Not on file    Outpatient Medications Prior to Visit  Medication Sig Dispense Refill  . apixaban (ELIQUIS) 5 MG TABS tablet Take 1 tablet (5 mg total) by mouth 2 (two) times daily. 180 tablet 3  . atorvastatin (LIPITOR) 10 MG tablet Take 1 tablet by mouth once daily 90 tablet 0  . blood glucose meter kit and supplies KIT Dispense based on patient and insurance preference. 1 each 0  . canagliflozin (INVOKANA) 300 MG TABS tablet Take 1 tablet (300 mg total) by mouth daily before breakfast. 90 tablet 0  . Continuous Blood Gluc Receiver (FREESTYLE LIBRE 14 DAY READER)  DEVI 1 Device by Does not apply route daily. Use to check blood sugars every morning and 2 hours after largest meals 1 each 1  . Continuous Blood Gluc Sensor (FREESTYLE LIBRE 14 DAY SENSOR) MISC USE TO CHECK BLOOD SUGARS IN THE MORNING FASTING AND  2  HOURS  AFTER  LARGEST  MEAL 6 each 0  . diclofenac Sodium (VOLTAREN) 1 % GEL Apply 2 g topically 4 (four) times daily as needed. 50 g 0  . doxycycline (VIBRA-TABS) 100 MG tablet Take 1 tablet (100 mg total) by mouth 2 (two) times daily. 20 tablet 0  . finasteride (PROSCAR) 5 MG tablet Take 5 mg by mouth daily.  11  . furosemide (LASIX) 20 MG tablet Take 1 tablet (20 mg total) by mouth daily. 6 tablet 0  . gabapentin (NEURONTIN) 300 MG capsule TAKE 2 CAPSULES BY MOUTH IN THE MORNING AND 2 CAPSULES IN THE EVENING AT DINNER TIME 360 capsule 0  . glucose blood (FREESTYLE LITE) test strip Use to check blood sugars every morning fasting 100 each 12  . metFORMIN (GLUCOPHAGE-XR) 500 MG 24 hr tablet TAKE 4 TABLETS BY MOUTH ONCE DAILY WITH BREAKFAST 720 tablet 0  . methocarbamol (ROBAXIN) 500 MG tablet Take 1 tablet (500 mg total) by mouth every 8 (eight) hours as needed for muscle spasms. 20 tablet 0  . Multiple Vitamin  (MULTIVITAMIN WITH MINERALS) TABS tablet Take 1 tablet by mouth daily. Men's One-A-Day Multivitamin    . tamsulosin (FLOMAX) 0.4 MG CAPS capsule Take 1 capsule by mouth once daily at bedtime 90 capsule 0  . WELLBUTRIN SR 150 MG 12 hr tablet Take 1 tablet (150 mg total) by mouth 2 (two) times daily. 60 tablet 3  . lisinopril (ZESTRIL) 5 MG tablet Take 1/2 (one-half) tablet by mouth once daily 45 tablet 2   No facility-administered medications prior to visit.    Allergies  Allergen Reactions  . Bee Venom Anaphylaxis  . Penicillins Other (See Comments)    CAUSES "FREE BLEEDING" Has patient had a PCN reaction causing immediate rash, facial/tongue/throat swelling, SOB or lightheadedness with hypotension: No Has patient had a PCN reaction causing severe rash involving mucus membranes or skin necrosis: No Has patient had a PCN reaction that required hospitalization No Has patient had a PCN reaction occurring within the last 10 years: No If all of the above answers are "NO", then may proceed with Cephalosporin use.    ROS Review of Systems A fourteen system review of systems was performed and found to be positive as per HPI.   Objective:    Physical Exam General:  Well Developed, well nourished, in no acute distress  Neuro:  Alert and oriented,  extra-ocular muscles intact  HEENT:  Normocephalic, atraumatic, neck supple Skin:  no gross rash, warm, pink. Cardiac:  RRR, S1 S2, no murmur  Respiratory:  ECTA B/L, Not using accessory muscles, speaking in full sentences- unlabored. Vascular:  Ext warm, no cyanosis apprec.; 1+ pitting edema Psych:  No HI/SI, judgement and insight good, Euthymic mood. Full Affect.  BP 126/73   Pulse 72   Temp 98.2 F (36.8 C)   Ht 5' 10" (1.778 m)   Wt 228 lb 8 oz (103.6 kg)   SpO2 97%   BMI 32.79 kg/m  Wt Readings from Last 3 Encounters:  05/16/21 228 lb 8 oz (103.6 kg)  03/27/21 214 lb (97.1 kg)  03/15/21 221 lb 3.2 oz (100.3 kg)     Health  Maintenance Due  Topic Date Due  . PNA vac Low Risk Adult (2 of 2 - PPSV23) 11/27/2018  . COVID-19 Vaccine (3 - Pfizer risk 4-dose series) 03/01/2020  . OPHTHALMOLOGY EXAM  05/04/2020    There are no preventive care reminders to display for this patient.  Lab Results  Component Value Date   TSH 1.930 02/07/2021   Lab Results  Component Value Date   WBC 10.8 02/07/2021   HGB 14.8 02/07/2021   HCT 45.4 02/07/2021   MCV 84 02/07/2021   PLT 271 02/07/2021   Lab Results  Component Value Date   NA 141 02/07/2021   K 4.2 02/07/2021   CO2 23 02/07/2021   GLUCOSE 145 (H) 02/07/2021   BUN 11 02/07/2021   CREATININE 0.69 (L) 02/07/2021   BILITOT 0.5 02/07/2021   ALKPHOS 123 (H) 02/07/2021   AST 14 02/07/2021   ALT 13 02/07/2021   PROT 6.9 02/07/2021   ALBUMIN 4.4 02/07/2021   CALCIUM 9.4 02/07/2021   ANIONGAP 11 09/04/2018   Lab Results  Component Value Date   CHOL 92 (L) 02/07/2021   Lab Results  Component Value Date   HDL 30 (L) 02/07/2021   Lab Results  Component Value Date   LDLCALC 44 02/07/2021   Lab Results  Component Value Date   TRIG 94 02/07/2021   Lab Results  Component Value Date   CHOLHDL 3.1 02/07/2021   Lab Results  Component Value Date   HGBA1C 7.1 (A) 05/16/2021      Assessment & Plan:   Problem List Items Addressed This Visit      Cardiovascular and Mediastinum   Hypertension associated with diabetes ([place]) - Primary   Relevant Medications   lisinopril (ZESTRIL) 5 MG tablet   Other Relevant Orders   POCT UA - Microalbumin (Completed)   POCT HgB A1C (Completed)   Comp Met (CMET)   CBC w/Diff     Endocrine   Diabetes mellitus type 2 in obese (H[facility])   Relevant Medications   lisinopril (ZESTRIL) 5 MG tablet   Other Relevant Orders   POCT UA - Microalbumin (Completed)   POCT HgB A1C (Completed)   Microalbuminuria due to type 2 diabetes mellitus (H[facility])   Relevant Medications   lisinopril (ZESTRIL) 5 MG tablet   Other Relevant  Orders   POCT UA - Microalbumin (Completed)   POCT HgB A1C (Completed)   Comp Met (CMET)   CBC w/Diff   Mixed diabetic hyperlipidemia associated with type 2 diabetes mellitus (HCC)   Relevant Medications   lisinopril (ZESTRIL) 5 MG tablet   Other Relevant Orders   POCT UA - Microalbumin (Completed)   POCT HgB A1C (Completed)   CBC w/Diff     Genitourinary   Prostate cancer ([place])   Relevant Orders   PSA     Diabetes mellitus type 2 in obese: -A1c has improved from 8.1 to 7.1, patient unsure if taking Invokana so advised to check at home and let me know.  Highlighted on medication list which medication to check for. -Recommend to continue metformin, follow low carbohydrate and glucose diet. -Will continue to monitor.  Microalbuminuria due to type 2 diabetes mellitus: -UA microalbumin obtained today, A:C >300 mg/g which has declined from prior.  Discussed with patient the importance of maintaining diabetes mellitus under control consistently to help reduce progression of potential complications. Recommend to increase lisinopril to 5 mg (currently taking 2.5 mg). -Will collect CMP to evaluate renal function.  Hypertension associated with diabetes: -Controlled. -Follow  low-sodium diet and stay well-hydrated. -Continue lisinopril. -Will continue to monitor.  Mixed diabetic hyperlipidemia associated with type 2 diabetes mellitus: -Last lipid panel: Total cholesterol 92, triglycerides 94, HDL 30, LDL 44 -Recommend to continue atorvastatin 10 mg.  Last ALT 13, WNL. -Follow a heart healthy diet and increase physical activity. -Will continue to monitor.  Prostate cancer: -Per [facility] last PSA 0.029 06/26/2018. Patient has not followed-up with the urologist so will repeat PSA today.  Lower extremity edema: -Advised to resume wearing compression socks (15-20 mmHg), follow low sodium diet and elevation. -Will collect CMP to evaluate renal function.  Meds ordered this encounter   Medications  . lisinopril (ZESTRIL) 5 MG tablet    Sig: Take 1 tablet (5 mg total) by mouth daily.    Dispense:  90 tablet    Refill:  1    Order Specific Question:   Supervising Provider    Answer:   Beatrice Lecher D [[id number]]    Follow-up: Return in about 3 months (around 08/16/2021) for DM, HTN, HLD and FBW.   Note:  This note was prepared with assistance of Dragon voice recognition software. Occasional wrong-word or sound-a-like substitutions may have occurred due to the inherent limitations of voice recognition software.  [name], PA-C

## 2021-05-17 LAB — CBC WITH DIFFERENTIAL/PLATELET
Basophils Absolute: 0.1 10*3/uL (ref 0.0–0.2)
Basos: 1 %
EOS (ABSOLUTE): 0.3 10*3/uL (ref 0.0–0.4)
Eos: 3 %
Hematocrit: 44.3 % (ref 37.5–51.0)
Hemoglobin: 14.4 g/dL (ref 13.0–17.7)
Immature Grans (Abs): 0 10*3/uL (ref 0.0–0.1)
Immature Granulocytes: 0 %
Lymphocytes Absolute: 4.5 10*3/uL — ABNORMAL HIGH (ref 0.7–3.1)
Lymphs: 40 %
MCH: 27.6 pg (ref 26.6–33.0)
MCHC: 32.5 g/dL (ref 31.5–35.7)
MCV: 85 fL (ref 79–97)
Monocytes Absolute: 0.7 10*3/uL (ref 0.1–0.9)
Monocytes: 6 %
Neutrophils Absolute: 5.7 10*3/uL (ref 1.4–7.0)
Neutrophils: 50 %
Platelets: 255 10*3/uL (ref 150–450)
RBC: 5.21 x10E6/uL (ref 4.14–5.80)
RDW: 14.4 % (ref 11.6–15.4)
WBC: 11.3 10*3/uL — ABNORMAL HIGH (ref 3.4–10.8)

## 2021-05-17 LAB — COMPREHENSIVE METABOLIC PANEL
ALT: 13 IU/L (ref 0–44)
AST: 11 IU/L (ref 0–40)
Albumin/Globulin Ratio: 1.7 (ref 1.2–2.2)
Albumin: 4.6 g/dL (ref 3.8–4.8)
Alkaline Phosphatase: 127 IU/L — ABNORMAL HIGH (ref 44–121)
BUN/Creatinine Ratio: 20 (ref 10–24)
BUN: 15 mg/dL (ref 8–27)
Bilirubin Total: 0.4 mg/dL (ref 0.0–1.2)
CO2: 23 mmol/L (ref 20–29)
Calcium: 9.6 mg/dL (ref 8.6–10.2)
Chloride: 104 mmol/L (ref 96–106)
Creatinine, Ser: 0.74 mg/dL — ABNORMAL LOW (ref 0.76–1.27)
Globulin, Total: 2.7 g/dL (ref 1.5–4.5)
Glucose: 151 mg/dL — ABNORMAL HIGH (ref 65–99)
Potassium: 4.9 mmol/L (ref 3.5–5.2)
Sodium: 143 mmol/L (ref 134–144)
Total Protein: 7.3 g/dL (ref 6.0–8.5)
eGFR: 98 mL/min/{1.73_m2} (ref >59)

## 2021-05-17 LAB — PSA: Prostate Specific Ag, Serum: <0.1 ng/mL (ref 0.0–4.0)

## 2021-06-02 ENCOUNTER — Telehealth: Payer: Self-pay | Admitting: *Deleted

## 2021-06-02 DIAGNOSIS — M1711 Unilateral primary osteoarthritis, right knee: Secondary | ICD-10-CM | POA: Diagnosis not present

## 2021-06-02 DIAGNOSIS — M25761 Osteophyte, right knee: Secondary | ICD-10-CM | POA: Diagnosis not present

## 2021-06-02 NOTE — Telephone Encounter (Signed)
   [facility] Pre-operative Risk Assessment    Patient Name: [name]  DOB: 1951-10-05  MRN: [id number]   HEARTCARE STAFF: - Please ensure there is not already an duplicate clearance open for this procedure. - Under Visit Info/Reason for Call, type in Other and utilize the format Clearance MM/DD/YY or Clearance TBD. Do not use dashes or single digits. - If request is for dental extraction, please clarify the # of teeth to be extracted. - If the patient is currently at the dentist's office, call Pre-Op APP to address. If the patient is not currently in the dentist office, please route to the Pre-Op pool  Request for surgical clearance:  What type of surgery is being performed? R TKR vs Partial   When is this surgery scheduled? Unknown   What type of clearance is required (medical clearance vs. Pharmacy clearance to hold med vs. Both)? Both  Are there any medications that need to be held prior to surgery and how long?Eliquis   Practice name and name of physician performing surgery? [facility]   What is the office phone number? [phone]   7.   What is the office fax number?           [phone]  8.   Anesthesia type (None, local, MAC, general) ? Georgiann Hahn 06/02/2021, 2:09 PM  _________________________________________________________________   (provider comments below)

## 2021-06-02 NOTE — Telephone Encounter (Signed)
Patient with diagnosis of Afib on Eliquis for anticoagulation.    Procedure:  R TKR vs Partial  Date of procedure: TBD  CHA2DS2-VASc Score = 6  This indicates a 9.7% annual risk of stroke. The patient's score is based upon: CHF History: No HTN History: Yes Diabetes History: Yes Stroke History: Yes Vascular Disease History: Yes Age Score: 1 Gender Score: 0    CrCl 113.5 ml/mib Platelet count 255  Patient is high risk off anticoagulation due to hx of stroke. He would need a 3 day hold if a total knee replacement is done and spinal anesthesia is used. I will defer to Dr. Sallyanne Kuster.

## 2021-06-03 NOTE — Telephone Encounter (Signed)
   Primary Cardiologist: Sanda Klein, MD  Chart reviewed as part of pre-operative protocol coverage. Given past medical history and time since last visit, based on ACC/AHA guidelines, [name] would be at acceptable risk for the planned procedure without further cardiovascular testing.   Patient with diagnosis of Afib on Eliquis for anticoagulation.     Procedure:  R TKR vs Partial  Date of procedure: TBD   CHA2DS2-VASc Score = 6  This indicates a 9.7% annual risk of stroke. The patient's score is based upon: CHF History: No HTN History: Yes Diabetes History: Yes Stroke History: Yes Vascular Disease History: Yes Age Score: 1 Gender Score: 0    CrCl 113.5 ml/mib Platelet count 255   Patient is high risk off anticoagulation due to hx of stroke. He would need a 3 day hold if a total knee replacement is done and spinal anesthesia is used.  Per Dr. Sallyanne Kuster, Agree that if he requires spinal anesthesia, that will require an extra day of holding Eliquis, which is not desirable.  Ideally will not hold Eliquis more than 48 hours.  Patient was advised that if he develops new symptoms prior to surgery to contact our office to arrange a follow-up appointment.  He verbalized understanding.  I will route this recommendation to the requesting party via Epic fax function and remove from pre-op pool.  Please call with questions.  Jossie Ng. Nafisah Runions NP-C    06/03/2021, 7:34 AM [place] [address] Office [phone] Fax [phone]

## 2021-06-17 ENCOUNTER — Other Ambulatory Visit: Payer: Self-pay | Admitting: Physician Assistant

## 2021-06-17 DIAGNOSIS — E1142 Type 2 diabetes mellitus with diabetic polyneuropathy: Secondary | ICD-10-CM

## 2021-06-30 ENCOUNTER — Encounter: Payer: Self-pay | Admitting: Physician Assistant

## 2021-06-30 ENCOUNTER — Ambulatory Visit (INDEPENDENT_AMBULATORY_CARE_PROVIDER_SITE_OTHER): Payer: PPO | Admitting: Physician Assistant

## 2021-06-30 ENCOUNTER — Other Ambulatory Visit: Payer: Self-pay

## 2021-06-30 VITALS — BP 126/73 | HR 80 | Temp 98.3°F | Ht 70.0 in | Wt 221.7 lb

## 2021-06-30 DIAGNOSIS — E669 Obesity, unspecified: Secondary | ICD-10-CM

## 2021-06-30 DIAGNOSIS — Z01818 Encounter for other preprocedural examination: Secondary | ICD-10-CM

## 2021-06-30 DIAGNOSIS — I152 Hypertension secondary to endocrine disorders: Secondary | ICD-10-CM

## 2021-06-30 DIAGNOSIS — E1159 Type 2 diabetes mellitus with other circulatory complications: Secondary | ICD-10-CM

## 2021-06-30 DIAGNOSIS — E1169 Type 2 diabetes mellitus with other specified complication: Secondary | ICD-10-CM | POA: Diagnosis not present

## 2021-06-30 DIAGNOSIS — E782 Mixed hyperlipidemia: Secondary | ICD-10-CM | POA: Diagnosis not present

## 2021-06-30 DIAGNOSIS — I480 Paroxysmal atrial fibrillation: Secondary | ICD-10-CM | POA: Diagnosis not present

## 2021-06-30 DIAGNOSIS — R350 Frequency of micturition: Secondary | ICD-10-CM

## 2021-06-30 DIAGNOSIS — Z716 Tobacco abuse counseling: Secondary | ICD-10-CM | POA: Diagnosis not present

## 2021-06-30 DIAGNOSIS — M545 Low back pain, unspecified: Secondary | ICD-10-CM

## 2021-06-30 MED ORDER — METFORMIN HCL ER 500 MG PO TB24: ORAL_TABLET | ORAL | 1 refills | Status: DC

## 2021-06-30 NOTE — Progress Notes (Signed)
Established Patient Office Visit  Subjective:  Patient ID: [name], male    DOB: [birth year]  Age: 70 y.o. MRN: [id number]  CC:  Chief Complaint  Patient presents with   Procedure    HPI [name] presents for preoperative clearance for right knee total vs partial replacement. Patient has PMHx of DM type 2, HTN, HLD, stroke, s/p CABG, AAA, CKD, and prostate cancer. Patient denies chest pain, palpitations, shortness of breath, wheezing, dizziness or syncope/presyncope. Denies prior surgical or anesthesia complications. Does reports a fall a few months ago, states hit his left hip against a dresser and has intermittent back pain on the left side. Reports will be getting ready to go to bed and the pain will start with certain movement. Has not tried anything for the pain. Denies bladder or bowel dysfunction. States has urinary frequency despite taking medication. Patient continues to work on reducing smoking, smokes 1 PPD.   Past Medical History:  Diagnosis Date   Abdominal aortic aneurysm (AAA) (HCC)    3 cm per 07-02-18 US abdominal aorta US epic   Arthritis    Blood clot in vein    behind left knee   Chronic kidney disease    renal calculi   Chronic venous insufficiency LOWER EXTREMITIES   Coronary artery disease CARDIOLOGIST - DR  [id number]-  LAST 1 WK AGO -- WILL REQUEST NOTE AND STRESS TEST   Diabetes mellitus without complication ([place])    type 2   Hematuria    last year   Hyperglycemia    Hyperlipidemia    Hypertension    Mixed dyslipidemia    Myocardial infarction ([place])    Neuropathy    toes only   Nocturia    Prostate cancer ([place]) 08/18/11   gleason 8, volume 24.4cc   S/P CABG x 4    ST elevation MI (STEMI) ([place]) 02-10-2008   S/P CABG   Stroke ([place]) 2016   occ trouble wriing with right hand   Urinary hesitancy    Vision abnormalities    resolved now   Wound discharge    since jan 2019 goes to wound center [facility] left great toe small open area  changes dressing q 2 days with ointment provided by wound center, clear drainage occ    Past Surgical History:  Procedure Laterality Date   CARDIAC CATHETERIZATION  05/01/2012   grafts widely patent   CARDIOVASCULAR STRESS TEST  03-15-2010   INFERIOR WALL SCAR WITHOUT ANY MEANINGFUL ISCHEMIA/ EF 46% / LOW RISK SCAN   CATARACT EXTRACTION     CORONARY ARTERY BYPASS GRAFT  02-10-2008  DR Einar Gip   X4 VESSEL DISEASE / EMERGANT CABG   CYSTOSCOPY  02/08/2012   Procedure: CYSTOSCOPY FLEXIBLE;  Surgeon: Franchot Gallo, MD;  Location: [facility];  Service: Urology;;   CYSTOSCOPY WITH RETROGRADE PYELOGRAM, URETEROSCOPY AND STENT PLACEMENT Right 09/12/2018   Procedure: CYSTOSCOPY WITH RETROGRADE PYELOGRAM, URETEROSCOPY AND STENT PLACEMENT, STONE EXTRACTION;  Surgeon: Franchot Gallo, MD;  Location: [facility];  Service: Urology;  Laterality: Right;   EP IMPLANTABLE DEVICE N/A 08/14/2016   Procedure: Loop Recorder Insertion;  Surgeon: Evans Lance, MD;  Location: [facility];  Service: Cardiovascular;  Laterality: N/A;   EXTRACORPOREAL SHOCK WAVE LITHOTRIPSY  2013   HOLMIUM LASER APPLICATION Right 04/18/9562   Procedure: HOLMIUM LASER APPLICATION;  Surgeon: Franchot Gallo, MD;  Location: [facility];  Service: Urology;  Laterality: Right;   KNEE SURGERY  age 50  RIGHT   LEFT HEART CATHETERIZATION WITH CORONARY/GRAFT ANGIOGRAM N/A 05/01/2012   Procedure: LEFT HEART CATHETERIZATION WITH Beatrix Fetters;  Surgeon: Sanda Klein, MD;  Location: [facility];  Service: Cardiovascular;  Laterality: N/A;   MULTIPLE TEETH EXTRACTIONS (23)/ FOUR QUADRANT ALVEOLPLASTY/ MANDIBULAR LATERAL EXOSTOSES REDUCTIONS  03-24-2010   CHRONIC PERIODONTITIS   RADIOACTIVE SEED IMPLANT  02/08/2012   Procedure: RADIOACTIVE SEED IMPLANT;  Surgeon: Franchot Gallo, MD;  Location: [facility];  Service: Urology;  Laterality: N/A;  C-ARM    SHOULDER SURGERY  1982   LEFT   TEE WITHOUT  CARDIOVERSION N/A 08/14/2016   Procedure: TRANSESOPHAGEAL ECHOCARDIOGRAM (TEE);  Surgeon: Josue Hector, MD;  Location: [facility];  Service: Cardiovascular;  Laterality: N/A;   URETERAL STENT PLACEMENT      Family History  Problem Relation Age of Onset   Cancer Father        pancreatic   Diabetes Father     Social History   Socioeconomic History   Marital status: Married    Spouse name: Not on file   Number of children: Not on file   Years of education: Not on file   Highest education level: Not on file  Occupational History   Not on file  Tobacco Use   Smoking status: Every Day    Packs/day: 1.00    Years: 50.00    Pack years: 50.00    Types: Cigarettes   Smokeless tobacco: Never   Tobacco comments:    PREVIOUSLY SMOKED 3PPD / DECREASED TO 1PPD SINCE 2009  Vaping Use   Vaping Use: Never used  Substance and Sexual Activity   Alcohol use: Not Currently    Alcohol/week: 2.0 standard drinks    Types: 1 Cans of beer, 1 Shots of liquor per week    Comment: none in 3 years   Drug use: No   Sexual activity: Never  Other Topics Concern   Not on file  Social History Narrative   Not on file   Social Determinants of Health   Financial Resource Strain: Not on file  Food Insecurity: Not on file  Transportation Needs: Not on file  Physical Activity: Not on file  Stress: Not on file  Social Connections: Not on file  Intimate Partner Violence: Not on file    Outpatient Medications Prior to Visit  Medication Sig Dispense Refill   apixaban (ELIQUIS) 5 MG TABS tablet Take 1 tablet (5 mg total) by mouth 2 (two) times daily. 180 tablet 3   atorvastatin (LIPITOR) 10 MG tablet Take 1 tablet by mouth once daily 90 tablet 0   blood glucose meter kit and supplies KIT Dispense based on patient and insurance preference. 1 each 0   canagliflozin (INVOKANA) 300 MG TABS tablet Take 1 tablet (300 mg total) by mouth daily before breakfast. 90 tablet 0   Continuous Blood Gluc Receiver  (FREESTYLE LIBRE 14 DAY READER) DEVI 1 Device by Does not apply route daily. Use to check blood sugars every morning and 2 hours after largest meals 1 each 1   Continuous Blood Gluc Sensor (FREESTYLE LIBRE 14 DAY SENSOR) MISC USE TO CHECK BLOOD SUGARS IN THE MORNING FASTING AND  2  HOURS  AFTER  LARGEST  MEAL 6 each 0   diclofenac Sodium (VOLTAREN) 1 % GEL Apply 2 g topically 4 (four) times daily as needed. 50 g 0   doxycycline (VIBRA-TABS) 100 MG tablet Take 1 tablet (100 mg total) by mouth 2 (two) times daily.  20 tablet 0   finasteride (PROSCAR) 5 MG tablet Take 5 mg by mouth daily.  11   furosemide (LASIX) 20 MG tablet Take 1 tablet (20 mg total) by mouth daily. 6 tablet 0   gabapentin (NEURONTIN) 300 MG capsule TAKE 2 CAPSULES BY MOUTH IN THE MORNING AND 2 CAPSULES IN THE EVENING AT DINNER TIME 360 capsule 0   glucose blood (FREESTYLE LITE) test strip Use to check blood sugars every morning fasting 100 each 12   lisinopril (ZESTRIL) 5 MG tablet Take 1 tablet (5 mg total) by mouth daily. 90 tablet 1   methocarbamol (ROBAXIN) 500 MG tablet Take 1 tablet (500 mg total) by mouth every 8 (eight) hours as needed for muscle spasms. 20 tablet 0   Multiple Vitamin (MULTIVITAMIN WITH MINERALS) TABS tablet Take 1 tablet by mouth daily. Men's One-A-Day Multivitamin     WELLBUTRIN SR 150 MG 12 hr tablet Take 1 tablet (150 mg total) by mouth 2 (two) times daily. 60 tablet 3   metFORMIN (GLUCOPHAGE-XR) 500 MG 24 hr tablet TAKE 4 TABLETS BY MOUTH ONCE DAILY WITH BREAKFAST 720 tablet 0   tamsulosin (FLOMAX) 0.4 MG CAPS capsule Take 1 capsule by mouth once daily at bedtime 90 capsule 0   No facility-administered medications prior to visit.    Allergies  Allergen Reactions   Bee Venom Anaphylaxis   Penicillins Other (See Comments)    CAUSES "FREE BLEEDING" Has patient had a PCN reaction causing immediate rash, facial/tongue/throat swelling, SOB or lightheadedness with hypotension: No Has patient had a PCN  reaction causing severe rash involving mucus membranes or skin necrosis: No Has patient had a PCN reaction that required hospitalization No Has patient had a PCN reaction occurring within the last 10 years: No If all of the above answers are "NO", then may proceed with Cephalosporin use.    ROS Review of Systems Review of Systems:  A fourteen system review of systems was performed and found to be positive as per HPI.   Objective:    Physical Exam Constitutional:      General: He is not in acute distress.    Appearance: He is not ill-appearing.  HENT:     Head: Normocephalic and atraumatic.     Right Ear: Tympanic membrane normal.     Left Ear: Tympanic membrane normal.     Nose: Nose normal.     Mouth/Throat:     Pharynx: Oropharynx is clear. No oropharyngeal exudate or posterior oropharyngeal erythema.  Eyes:     Extraocular Movements: Extraocular movements intact.     Pupils: Pupils are equal, round, and reactive to light.  Cardiovascular:     Rate and Rhythm: Normal rate and regular rhythm.     Pulses: Normal pulses.     Heart sounds: Normal heart sounds. No murmur heard. Pulmonary:     Effort: Pulmonary effort is normal. No respiratory distress.     Breath sounds: Normal breath sounds. No wheezing.  Abdominal:     General: Abdomen is flat. Bowel sounds are normal. There is no distension.     Tenderness: There is no abdominal tenderness. There is no guarding or rebound.  Musculoskeletal:        General: No swelling, tenderness or deformity. Normal range of motion.     Cervical back: Normal range of motion and neck supple.     Right lower leg: No edema.     Left lower leg: No edema.  Skin:    General: Skin  is warm and dry.  Neurological:     General: No focal deficit present.     Mental Status: He is alert.  Psychiatric:        Mood and Affect: Mood normal.        Behavior: Behavior normal.        Thought Content: Thought content normal.        Judgment: Judgment  normal.    BP 126/73   Pulse 80   Temp 98.3 F (36.8 C)   Ht 5' 10"  (1.778 m)   Wt 221 lb 11.2 oz (100.6 kg)   SpO2 95%   BMI 31.81 kg/m  Wt Readings from Last 3 Encounters:  06/30/21 221 lb 11.2 oz (100.6 kg)  05/16/21 228 lb 8 oz (103.6 kg)  03/27/21 214 lb (97.1 kg)     Health Maintenance Due  Topic Date Due   Zoster Vaccines- Shingrix (1 of 2) Never done   PNA vac Low Risk Adult (2 of 2 - PPSV23) 11/27/2018   COVID-19 Vaccine (3 - Pfizer risk series) 03/01/2020   OPHTHALMOLOGY EXAM  05/04/2020    There are no preventive care reminders to display for this patient.  Lab Results  Component Value Date   TSH 1.930 02/07/2021   Lab Results  Component Value Date   WBC 10.6 06/30/2021   HGB 14.4 06/30/2021   HCT 44.7 06/30/2021   MCV 87 06/30/2021   PLT 234 06/30/2021   Lab Results  Component Value Date   NA 143 06/30/2021   K 4.5 06/30/2021   CO2 25 06/30/2021   GLUCOSE 247 (H) 06/30/2021   BUN 14 06/30/2021   CREATININE 0.94 06/30/2021   BILITOT 0.3 06/30/2021   ALKPHOS 138 (H) 06/30/2021   AST 14 06/30/2021   ALT 16 06/30/2021   PROT 6.9 06/30/2021   ALBUMIN 4.5 06/30/2021   CALCIUM 9.8 06/30/2021   ANIONGAP 11 09/04/2018   EGFR 88 06/30/2021   Lab Results  Component Value Date   CHOL 92 (L) 02/07/2021   Lab Results  Component Value Date   HDL 30 (L) 02/07/2021   Lab Results  Component Value Date   LDLCALC 44 02/07/2021   Lab Results  Component Value Date   TRIG 94 02/07/2021   Lab Results  Component Value Date   CHOLHDL 3.1 02/07/2021   Lab Results  Component Value Date   HGBA1C 7.1 (A) 05/16/2021      Assessment & Plan:   Problem List Items Addressed This Visit       Cardiovascular and Mediastinum   Hypertension associated with diabetes ([place])   Relevant Medications   metFORMIN (GLUCOPHAGE-XR) 500 MG 24 hr tablet   Paroxysmal atrial fibrillation (HCC)     Endocrine   Diabetes mellitus type 2 in obese (HCC)   Relevant  Medications   metFORMIN (GLUCOPHAGE-XR) 500 MG 24 hr tablet   Mixed diabetic hyperlipidemia associated with type 2 diabetes mellitus (HCC)   Relevant Medications   metFORMIN (GLUCOPHAGE-XR) 500 MG 24 hr tablet     Other   Increased urinary frequency   Relevant Medications   tamsulosin (FLOMAX) 0.4 MG CAPS capsule   Other Visit Diagnoses     Preoperative clearance    -  Primary   Relevant Orders   Comp Met (CMET) (Completed)   CBC w/Diff (Completed)   Tobacco abuse counseling           Preoperative clearance: -Patient has been cleared by cardiology. -Physical exam and  vital signs are stable. Last A1c 7.1 (05/16/2021) and BMI 31.81 meet requirements for surgery. Will obtain CMP and CBC. Pending lab results will medically clear patient for surgery.  -Recommend to follow cardiology recommendations for anticoagulant therapy, patient has hx of embolic stroke.  Diabetes mellitus type 2 in obese: -Stable. -Continue current medication regimen. -Will continue to monitor.  Hypertension associated with diabetes: -Controlled. -Continue current medication regimen. Will collect CMP. -Will continue to monitor alongside cardiology.  Mixed diabetic hyperlipidemia associated wit type 2 diabetes mellitus: -Last lipid panel stable, LDL 44 (at goal <70). -Continue current medication regimen.   Paroxysmal atrial fibrillation: -Followed by cardiology. -Asymptomatic.  -On Eliquis.  Tobacco abuse counseling: -Smoking cessation instruction/counseling given:  counseled patient on the dangers of tobacco use, advised patient to stop smoking, and reviewed strategies to maximize success. -Encourage to continue reducing tobacco use.  Increased urinary frequency: -Advised patient to take 0.8 mg of tamsulosin to help improve symptoms. Recommend to avoid/reduce diuretic beverages such as caffeine.  Intermittent low back pain- left side: -Discussed with patient likely MSK related. Recommend  conservative therapy and to apply heat during flare-up. If symptoms fail to improve or worsen recommend further evaluation.  Meds ordered this encounter  Medications   metFORMIN (GLUCOPHAGE-XR) 500 MG 24 hr tablet    Sig: TAKE 4 TABLETS BY MOUTH ONCE DAILY WITH BREAKFAST    Dispense:  360 tablet    Refill:  1    Order Specific Question:   Supervising Provider    Answer:   Beatrice Lecher D [[id number]]   tamsulosin (FLOMAX) 0.4 MG CAPS capsule    Sig: Take 2 capsules (0.8 mg total) by mouth at bedtime.    Dispense:  90 capsule    Refill:  0    Order Specific Question:   Supervising Provider    Answer:   Beatrice Lecher D [[id number]]    Follow-up: Return for as scheduled .   Note:  This note was prepared with assistance of Dragon voice recognition software. Occasional wrong-word or sound-a-like substitutions may have occurred due to the inherent limitations of voice recognition software.  [name], PA-C

## 2021-07-01 LAB — COMPREHENSIVE METABOLIC PANEL
ALT: 16 IU/L (ref 0–44)
AST: 14 IU/L (ref 0–40)
Albumin/Globulin Ratio: 1.9 (ref 1.2–2.2)
Albumin: 4.5 g/dL (ref 3.8–4.8)
Alkaline Phosphatase: 138 IU/L — ABNORMAL HIGH (ref 44–121)
BUN/Creatinine Ratio: 15 (ref 10–24)
BUN: 14 mg/dL (ref 8–27)
Bilirubin Total: 0.3 mg/dL (ref 0.0–1.2)
CO2: 25 mmol/L (ref 20–29)
Calcium: 9.8 mg/dL (ref 8.6–10.2)
Chloride: 103 mmol/L (ref 96–106)
Creatinine, Ser: 0.94 mg/dL (ref 0.76–1.27)
Globulin, Total: 2.4 g/dL (ref 1.5–4.5)
Glucose: 247 mg/dL — ABNORMAL HIGH (ref 65–99)
Potassium: 4.5 mmol/L (ref 3.5–5.2)
Sodium: 143 mmol/L (ref 134–144)
Total Protein: 6.9 g/dL (ref 6.0–8.5)
eGFR: 88 mL/min/{1.73_m2} (ref >59)

## 2021-07-01 LAB — CBC WITH DIFFERENTIAL/PLATELET
Basophils Absolute: 0.1 10*3/uL (ref 0.0–0.2)
Basos: 1 %
EOS (ABSOLUTE): 0.2 10*3/uL (ref 0.0–0.4)
Eos: 2 %
Hematocrit: 44.7 % (ref 37.5–51.0)
Hemoglobin: 14.4 g/dL (ref 13.0–17.7)
Immature Grans (Abs): 0 10*3/uL (ref 0.0–0.1)
Immature Granulocytes: 0 %
Lymphocytes Absolute: 4.2 10*3/uL — ABNORMAL HIGH (ref 0.7–3.1)
Lymphs: 40 %
MCH: 27.9 pg (ref 26.6–33.0)
MCHC: 32.2 g/dL (ref 31.5–35.7)
MCV: 87 fL (ref 79–97)
Monocytes Absolute: 0.5 10*3/uL (ref 0.1–0.9)
Monocytes: 5 %
Neutrophils Absolute: 5.6 10*3/uL (ref 1.4–7.0)
Neutrophils: 52 %
Platelets: 234 10*3/uL (ref 150–450)
RBC: 5.16 x10E6/uL (ref 4.14–5.80)
RDW: 15.4 % (ref 11.6–15.4)
WBC: 10.6 10*3/uL (ref 3.4–10.8)

## 2021-07-01 MED ORDER — TAMSULOSIN HCL 0.4 MG PO CAPS: 0.8000 mg | ORAL_CAPSULE | Freq: Every day | ORAL | 0 refills | Status: DC

## 2021-07-11 ENCOUNTER — Other Ambulatory Visit: Payer: Self-pay | Admitting: Orthopedic Surgery

## 2021-07-11 DIAGNOSIS — H5212 Myopia, left eye: Secondary | ICD-10-CM | POA: Diagnosis not present

## 2021-07-11 DIAGNOSIS — H04123 Dry eye syndrome of bilateral lacrimal glands: Secondary | ICD-10-CM | POA: Diagnosis not present

## 2021-07-11 DIAGNOSIS — E119 Type 2 diabetes mellitus without complications: Secondary | ICD-10-CM | POA: Diagnosis not present

## 2021-07-11 DIAGNOSIS — H52201 Unspecified astigmatism, right eye: Secondary | ICD-10-CM | POA: Diagnosis not present

## 2021-07-11 LAB — HM DIABETES EYE EXAM

## 2021-07-13 ENCOUNTER — Encounter: Payer: Self-pay | Admitting: Physician Assistant

## 2021-08-04 ENCOUNTER — Other Ambulatory Visit: Payer: Self-pay | Admitting: Cardiovascular Disease

## 2021-08-04 ENCOUNTER — Other Ambulatory Visit: Payer: Self-pay | Admitting: Physician Assistant

## 2021-08-04 NOTE — Telephone Encounter (Signed)
Prescription refill request for Eliquis received. Indication:afib Last office visit:croitoru 03/15/21 Scr:0.94 06/30/21 Age: 64mWeight:100.6kg

## 2021-08-16 ENCOUNTER — Encounter: Payer: Self-pay | Admitting: Physician Assistant

## 2021-08-16 ENCOUNTER — Ambulatory Visit (INDEPENDENT_AMBULATORY_CARE_PROVIDER_SITE_OTHER): Payer: PPO | Admitting: Physician Assistant

## 2021-08-16 ENCOUNTER — Other Ambulatory Visit: Payer: Self-pay

## 2021-08-16 VITALS — BP 138/87 | HR 60 | Temp 97.5°F | Ht 71.0 in | Wt 229.8 lb

## 2021-08-16 DIAGNOSIS — E669 Obesity, unspecified: Secondary | ICD-10-CM

## 2021-08-16 DIAGNOSIS — Z720 Tobacco use: Secondary | ICD-10-CM

## 2021-08-16 DIAGNOSIS — E782 Mixed hyperlipidemia: Secondary | ICD-10-CM

## 2021-08-16 DIAGNOSIS — R600 Localized edema: Secondary | ICD-10-CM | POA: Diagnosis not present

## 2021-08-16 DIAGNOSIS — E1169 Type 2 diabetes mellitus with other specified complication: Secondary | ICD-10-CM | POA: Diagnosis not present

## 2021-08-16 DIAGNOSIS — I152 Hypertension secondary to endocrine disorders: Secondary | ICD-10-CM | POA: Diagnosis not present

## 2021-08-16 DIAGNOSIS — E1159 Type 2 diabetes mellitus with other circulatory complications: Secondary | ICD-10-CM

## 2021-08-16 DIAGNOSIS — Z716 Tobacco abuse counseling: Secondary | ICD-10-CM

## 2021-08-16 DIAGNOSIS — E1142 Type 2 diabetes mellitus with diabetic polyneuropathy: Secondary | ICD-10-CM

## 2021-08-16 LAB — POCT GLYCOSYLATED HEMOGLOBIN (HGB A1C): Hemoglobin A1C: 7.5 % — AB (ref 4.0–5.6)

## 2021-08-16 MED ORDER — WELLBUTRIN SR 150 MG PO TB12: 150.0000 mg | ORAL_TABLET | Freq: Two times a day (BID) | ORAL | 0 refills | Status: DC

## 2021-08-16 MED ORDER — FUROSEMIDE 20 MG PO TABS: 20.0000 mg | ORAL_TABLET | Freq: Every day | ORAL | 0 refills | Status: DC | PRN

## 2021-08-16 NOTE — Patient Instructions (Signed)
Managing the Challenge of Quitting Smoking Quitting smoking is a physical and mental challenge. You will face cravings, withdrawal symptoms, and temptation. Before quitting, work with your health care provider to make a plan that can help you manage quitting. Preparation canhelp you quit and keep you from giving in. How to manage lifestyle changes Managing stress Stress can make you want to smoke, and wanting to smoke may cause stress. It is important to find ways to manage your stress. You might try some of the following: Practice relaxation techniques. Breathe slowly and deeply, in through your nose and out through your mouth. Listen to music. Soak in a bath or take a shower. Imagine a peaceful place or vacation. Get some support. Talk with family or friends about your stress. Join a support group. Talk with a counselor or therapist. Get some physical activity. Go for a walk, run, or bike ride. Play a favorite sport. Practice yoga.  Medicines Talk with your health care provider about medicines that might help you dealwith cravings and make quitting easier for you. Relationships Social situations can be difficult when you are quitting smoking. To manage this, you can: Avoid parties and other social situations where people might be smoking. Avoid alcohol. Leave right away if you have the urge to smoke. Explain to your family and friends that you are quitting smoking. Ask for support and let them know you might be a bit grumpy. Plan activities where smoking is not an option. General instructions Be aware that many people gain weight after they quit smoking. However, not everyone does. To keep from gaining weight, have a plan in place before you quit and stick to the plan after you quit. Your plan should include: Having healthy snacks. When you have a craving, it may help to: Eat popcorn, carrots, celery, or other cut vegetables. Chew sugar-free gum. Changing how you eat. Eat small  portion sizes at meals. Eat 4-6 small meals throughout the day instead of 1-2 large meals a day. Be mindful when you eat. Do not watch television or do other things that might distract you as you eat. Exercising regularly. Make time to exercise each day. If you do not have time for a long workout, do short bouts of exercise for 5-10 minutes several times a day. Do some form of strengthening exercise, such as weight lifting. Do some exercise that gets your heart beating and causes you to breathe deeply, such as walking fast, running, swimming, or biking. This is very important. Drinking plenty of water or other low-calorie or no-calorie drinks. Drink 6-8 glasses of water daily.  How to recognize withdrawal symptoms Your body and mind may experience discomfort as you try to get used to not having nicotine in your system. These effects are called withdrawal symptoms. They may include: Feeling hungrier than normal. Having trouble concentrating. Feeling irritable or restless. Having trouble sleeping. Feeling depressed. Craving a cigarette. To manage withdrawal symptoms: Avoid places, people, and activities that trigger your cravings. Remember why you want to quit. Get plenty of sleep. Avoid coffee and other caffeinated drinks. These may worsen some of your symptoms. These symptoms may surprise you. But be assured that they are normal to havewhen quitting smoking. How to manage cravings Come up with a plan for how to deal with your cravings. The plan should include the following: A definition of the specific situation you want to deal with. An alternative action you will take. A clear idea for how this action will help. The   name of someone who might help you with this. Cravings usually last for 5-10 minutes. Consider taking the following actions to help you with your plan to deal with cravings: Keep your mouth busy. Chew sugar-free gum. Suck on hard candies or a straw. Brush your  teeth. Keep your hands and body busy. Change to a different activity right away. Squeeze or play with a ball. Do an activity or a hobby, such as making bead jewelry, practicing needlepoint, or working with wood. Mix up your normal routine. Take a short exercise break. Go for a quick walk or run up and down stairs. Focus on doing something kind or helpful for someone else. Call a friend or family member to talk during a craving. Join a support group. Contact a quitline. Where to find support To get help or find a support group: Call the National Cancer Institute's Smoking Quitline: 1-800-QUIT NOW (784-8669) Visit the website of the Substance Abuse and Mental Health Services Administration: www.samhsa.gov Text QUIT to SmokefreeTXT: 478848 Where to find more information Visit these websites to find more information on quitting smoking: National Cancer Institute: www.smokefree.gov American Lung Association: www.lung.org American Cancer Society: www.cancer.org Centers for Disease Control and Prevention: www.cdc.gov American Heart Association: www.heart.org Contact a health care provider if: You want to change your plan for quitting. The medicines you are taking are not helping. Your eating feels out of control or you cannot sleep. Get help right away if: You feel depressed or become very anxious. Summary Quitting smoking is a physical and mental challenge. You will face cravings, withdrawal symptoms, and temptation to smoke again. Preparation can help you as you go through these challenges. Try different techniques to manage stress, handle social situations, and prevent weight gain. You can deal with cravings by keeping your mouth busy (such as by chewing gum), keeping your hands and body busy, calling family or friends, or contacting a quitline for people who want to quit smoking. You can deal with withdrawal symptoms by avoiding places where people smoke, getting plenty of rest, and  avoiding drinks with caffeine. This information is not intended to replace advice given to you by your health care provider. Make sure you discuss any questions you have with your healthcare provider. Document Revised: 09/30/2019 Document Reviewed: 09/30/2019 Elsevier Patient Education  2022 Elsevier Inc.  

## 2021-08-16 NOTE — Assessment & Plan Note (Signed)
-  Stable. -Continue current medication regimen.  -Will continue to monitor. 

## 2021-08-16 NOTE — Assessment & Plan Note (Signed)
-  Last lipid panel: total cholesterol 92, triglycerides 94, HDL 30, LDL 44 (at goal <70). -Continue current medication regimen.  -Will repeat lipid panel and hepatic function at follow up visit.

## 2021-08-16 NOTE — Assessment & Plan Note (Signed)
-  A1c has increased from 7.1 to 7.5, discussed with patient reducing simple carbohydrates and glucose intake including sodas. If A1c fails to improve or worsen then recommend treatment adjustments. Patient verbalized understanding. -Provided patient new Librestyle device, recommend to resume ambulatory glucose monitoring. -Will continue to monitor.

## 2021-08-16 NOTE — Progress Notes (Signed)
Established Patient Office Visit  Subjective:  Patient ID: [name], male    DOB: July 22, 1951  Age: 70 y.o. MRN: [id number]  CC:  Chief Complaint  Patient presents with   Follow-up   Diabetes   Hypertension   Hyperlipidemia    HPI [name] presents for follow up on diabetes mellitus, hypertension and hyperlipidemia.  Diabetes: Pt denies increased urination or thirst. Pt reports medication compliance with Metformin. No hypoglycemic events. Reports has not been using his Librestyle for about a week. When placing the device it was hurting his arm, he tried both arms. States his diet has been poor. Has been drinking more sodas.   HTN: Pt denies chest pain, palpitations, dizziness or shortness of breath. Does reports lower extremity swelling which can be worse on days he is working outside. Taking medication as directed without side effects. Patient continues to monitor sodium intake.  HLD: Pt taking medication as directed without issues. Patient has not been as diligent with monitoring his fat intake.  Tobacco use: States would like to restart Wellbutrin to help with smoking cessation.   Neuropathy: Reports medication compliance. Gabapentin helps with burning sensation and ability to do his daily activities with minimal discomfort.  Past Medical History:  Diagnosis Date   Abdominal aortic aneurysm (AAA) (HCC)    3 cm per 07-02-18 US abdominal aorta US epic   Arthritis    Blood clot in vein    behind left knee   Chronic kidney disease    renal calculi   Chronic venous insufficiency LOWER EXTREMITIES   Coronary artery disease CARDIOLOGIST - DR  [id number]-  LAST 1 WK AGO -- WILL REQUEST NOTE AND STRESS TEST   Diabetes mellitus without complication ([place])    type 2   Hematuria    last year   Hyperglycemia    Hyperlipidemia    Hypertension    Mixed dyslipidemia    Myocardial infarction ([place])    Neuropathy    toes only   Nocturia    Prostate cancer ([place]) 08/18/11    gleason 8, volume 24.4cc   S/P CABG x 4    ST elevation MI (STEMI) ([place]) 02-10-2008   S/P CABG   Stroke ([place]) 2016   occ trouble wriing with right hand   Urinary hesitancy    Vision abnormalities    resolved now   Wound discharge    since jan 2019 goes to wound center [facility] left great toe small open area changes dressing q 2 days with ointment provided by wound center, clear drainage occ    Past Surgical History:  Procedure Laterality Date   CARDIAC CATHETERIZATION  05/01/2012   grafts widely patent   CARDIOVASCULAR STRESS TEST  03-15-2010   INFERIOR WALL SCAR WITHOUT ANY MEANINGFUL ISCHEMIA/ EF 46% / LOW RISK SCAN   CATARACT EXTRACTION     CORONARY ARTERY BYPASS GRAFT  02-10-2008  DR Einar Gip   X4 VESSEL DISEASE / EMERGANT CABG   CYSTOSCOPY  02/08/2012   Procedure: CYSTOSCOPY FLEXIBLE;  Surgeon: Franchot Gallo, MD;  Location: [facility];  Service: Urology;;   CYSTOSCOPY WITH RETROGRADE PYELOGRAM, URETEROSCOPY AND STENT PLACEMENT Right 09/12/2018   Procedure: CYSTOSCOPY WITH RETROGRADE PYELOGRAM, URETEROSCOPY AND STENT PLACEMENT, STONE EXTRACTION;  Surgeon: Franchot Gallo, MD;  Location: [facility];  Service: Urology;  Laterality: Right;   EP IMPLANTABLE DEVICE N/A 08/14/2016   Procedure: Loop Recorder Insertion;  Surgeon: Evans Lance, MD;  Location: [place] [facility];  Service:  Cardiovascular;  Laterality: N/A;   EXTRACORPOREAL SHOCK WAVE LITHOTRIPSY  2013   HOLMIUM LASER APPLICATION Right 9/35/7017   Procedure: HOLMIUM LASER APPLICATION;  Surgeon: Franchot Gallo, MD;  Location: [facility];  Service: Urology;  Laterality: Right;   KNEE SURGERY  age 50   RIGHT   LEFT HEART CATHETERIZATION WITH CORONARY/GRAFT ANGIOGRAM N/A 05/01/2012   Procedure: LEFT HEART CATHETERIZATION WITH Beatrix Fetters;  Surgeon: Sanda Klein, MD;  Location: [place];  Service: Cardiovascular;  Laterality: N/A;   MULTIPLE TEETH EXTRACTIONS (23)/ FOUR QUADRANT ALVEOLPLASTY/  MANDIBULAR LATERAL EXOSTOSES REDUCTIONS  03-24-2010   CHRONIC PERIODONTITIS   RADIOACTIVE SEED IMPLANT  02/08/2012   Procedure: RADIOACTIVE SEED IMPLANT;  Surgeon: Franchot Gallo, MD;  Location: [facility];  Service: Urology;  Laterality: N/A;  C-ARM    SHOULDER SURGERY  1982   LEFT   TEE WITHOUT CARDIOVERSION N/A 08/14/2016   Procedure: TRANSESOPHAGEAL ECHOCARDIOGRAM (TEE);  Surgeon: Josue Hector, MD;  Location: [facility];  Service: Cardiovascular;  Laterality: N/A;   URETERAL STENT PLACEMENT      Family History  Problem Relation Age of Onset   Cancer Father        pancreatic   Diabetes Father     Social History   Socioeconomic History   Marital status: Married    Spouse name: Not on file   Number of children: Not on file   Years of education: Not on file   Highest education level: Not on file  Occupational History   Not on file  Tobacco Use   Smoking status: Every Day    Packs/day: 1.00    Years: 50.00    Pack years: 50.00    Types: Cigarettes   Smokeless tobacco: Never   Tobacco comments:    PREVIOUSLY SMOKED 3PPD / DECREASED TO 1PPD SINCE 2009  Vaping Use   Vaping Use: Never used  Substance and Sexual Activity   Alcohol use: Not Currently    Alcohol/week: 2.0 standard drinks    Types: 1 Cans of beer, 1 Shots of liquor per week    Comment: none in 3 years   Drug use: No   Sexual activity: Never  Other Topics Concern   Not on file  Social History Narrative   Not on file   Social Determinants of Health   Financial Resource Strain: Not on file  Food Insecurity: Not on file  Transportation Needs: Not on file  Physical Activity: Not on file  Stress: Not on file  Social Connections: Not on file  Intimate Partner Violence: Not on file    Outpatient Medications Prior to Visit  Medication Sig Dispense Refill   apixaban (ELIQUIS) 5 MG TABS tablet Take 1 tablet by mouth twice daily 180 tablet 1   atorvastatin (LIPITOR) 10 MG tablet Take  1 tablet by mouth once daily 90 tablet 0   blood glucose meter kit and supplies KIT Dispense based on patient and insurance preference. 1 each 0   Continuous Blood Gluc Receiver (FREESTYLE LIBRE 14 DAY READER) DEVI 1 Device by Does not apply route daily. Use to check blood sugars every morning and 2 hours after largest meals 1 each 1   Continuous Blood Gluc Sensor (FREESTYLE LIBRE 14 DAY SENSOR) MISC USE TO CHECK BLOOD SUGARS IN THE MORNING FASTING AND  2  HOURS  AFTER  LARGEST  MEAL 6 each 0   diclofenac Sodium (VOLTAREN) 1 % GEL Apply 2 g topically 4 (four) times daily as needed.  50 g 0   doxycycline (VIBRA-TABS) 100 MG tablet Take 1 tablet (100 mg total) by mouth 2 (two) times daily. 20 tablet 0   finasteride (PROSCAR) 5 MG tablet Take 5 mg by mouth daily.  11   gabapentin (NEURONTIN) 300 MG capsule TAKE 2 CAPSULES BY MOUTH IN THE MORNING AND 2 CAPSULES IN THE EVENING AT DINNER TIME 360 capsule 0   glucose blood (FREESTYLE LITE) test strip Use to check blood sugars every morning fasting 100 each 12   lisinopril (ZESTRIL) 5 MG tablet Take 1 tablet (5 mg total) by mouth daily. 90 tablet 1   metFORMIN (GLUCOPHAGE-XR) 500 MG 24 hr tablet TAKE 4 TABLETS BY MOUTH ONCE DAILY WITH BREAKFAST 360 tablet 1   methocarbamol (ROBAXIN) 500 MG tablet Take 1 tablet (500 mg total) by mouth every 8 (eight) hours as needed for muscle spasms. 20 tablet 0   Multiple Vitamin (MULTIVITAMIN WITH MINERALS) TABS tablet Take 1 tablet by mouth daily. Men's One-A-Day Multivitamin     tamsulosin (FLOMAX) 0.4 MG CAPS capsule Take 2 capsules (0.8 mg total) by mouth at bedtime. 90 capsule 0   canagliflozin (INVOKANA) 300 MG TABS tablet Take 1 tablet (300 mg total) by mouth daily before breakfast. 90 tablet 0   furosemide (LASIX) 20 MG tablet Take 1 tablet (20 mg total) by mouth daily. 6 tablet 0   WELLBUTRIN SR 150 MG 12 hr tablet Take 1 tablet (150 mg total) by mouth 2 (two) times daily. 60 tablet 3   No facility-administered  medications prior to visit.    Allergies  Allergen Reactions   Bee Venom Anaphylaxis   Penicillins Other (See Comments)    CAUSES "FREE BLEEDING" Has patient had a PCN reaction causing immediate rash, facial/tongue/throat swelling, SOB or lightheadedness with hypotension: No Has patient had a PCN reaction causing severe rash involving mucus membranes or skin necrosis: No Has patient had a PCN reaction that required hospitalization No Has patient had a PCN reaction occurring within the last 10 years: No If all of the above answers are "NO", then may proceed with Cephalosporin use.    ROS Review of Systems Review of Systems:  A fourteen system review of systems was performed and found to be positive as per HPI.   Objective:    Physical Exam General:  Well Developed, well nourished, in no acute distress  Neuro:  Alert and oriented,  extra-ocular muscles intact  HEENT:  Normocephalic, atraumatic, neck supple Skin:  no gross rash, warm, pink. Cardiac:  RRR, S1 S2 Respiratory:  Scattered rhonchi with mild expiratory wheezing, no crackles or rales. Not using accessory muscles, speaking in full sentences- unlabored. Vascular:  Ext warm, no cyanosis apprec.; cap RF less 2 sec. 2+ pitting edema of RLE, 1+ pitting edema LLE Psych:  No HI/SI, judgement and insight good, Euthymic mood. Full Affect.  BP 138/87   Pulse 60   Temp (!) 97.5 F (36.4 C)   Ht 5' 11"  (1.803 m)   Wt 229 lb 12.8 oz (104.2 kg)   SpO2 98%   BMI 32.05 kg/m  Wt Readings from Last 3 Encounters:  08/16/21 229 lb 12.8 oz (104.2 kg)  06/30/21 221 lb 11.2 oz (100.6 kg)  05/16/21 228 lb 8 oz (103.6 kg)     Health Maintenance Due  Topic Date Due   Hepatitis C Screening  Never done   Zoster Vaccines- Shingrix (1 of 2) Never done   PNA vac Low Risk Adult (2 of 2 -  PPSV23) 11/27/2018   COVID-19 Vaccine (3 - Pfizer risk series) 03/01/2020   INFLUENZA VACCINE  07/25/2021    There are no preventive care reminders to  display for this patient.  Lab Results  Component Value Date   TSH 1.930 02/07/2021   Lab Results  Component Value Date   WBC 10.6 06/30/2021   HGB 14.4 06/30/2021   HCT 44.7 06/30/2021   MCV 87 06/30/2021   PLT 234 06/30/2021   Lab Results  Component Value Date   NA 143 06/30/2021   K 4.5 06/30/2021   CO2 25 06/30/2021   GLUCOSE 247 (H) 06/30/2021   BUN 14 06/30/2021   CREATININE 0.94 06/30/2021   BILITOT 0.3 06/30/2021   ALKPHOS 138 (H) 06/30/2021   AST 14 06/30/2021   ALT 16 06/30/2021   PROT 6.9 06/30/2021   ALBUMIN 4.5 06/30/2021   CALCIUM 9.8 06/30/2021   ANIONGAP 11 09/04/2018   EGFR 88 06/30/2021   Lab Results  Component Value Date   CHOL 92 (L) 02/07/2021   Lab Results  Component Value Date   HDL 30 (L) 02/07/2021   Lab Results  Component Value Date   LDLCALC 44 02/07/2021   Lab Results  Component Value Date   TRIG 94 02/07/2021   Lab Results  Component Value Date   CHOLHDL 3.1 02/07/2021   Lab Results  Component Value Date   HGBA1C 7.5 (A) 08/16/2021      Assessment & Plan:   Problem List Items Addressed This Visit       Cardiovascular and Mediastinum   Hypertension associated with diabetes ([place])    -Stable. -Continue current medication regimen. Will repeat CMP for medication monitoring at follow up visit. -Will continue to monitor.      Relevant Medications   furosemide (LASIX) 20 MG tablet     Endocrine   Diabetes mellitus type 2 in obese (HCC) - Primary    -A1c has increased from 7.1 to 7.5, discussed with patient reducing simple carbohydrates and glucose intake including sodas. If A1c fails to improve or worsen then recommend treatment adjustments. Patient verbalized understanding. -Provided patient new Librestyle device, recommend to resume ambulatory glucose monitoring. -Will continue to monitor.      Relevant Orders   POCT HgB A1C (Completed)   Mixed diabetic hyperlipidemia associated with type 2 diabetes mellitus  ([place])    -Last lipid panel: total cholesterol 92, triglycerides 94, HDL 30, LDL 44 (at goal <70). -Continue current medication regimen.  -Will repeat lipid panel and hepatic function at follow up visit.       Relevant Medications   furosemide (LASIX) 20 MG tablet   Diabetic polyneuropathy associated with type 2 diabetes mellitus ([place])    -Stable. -Continue current medication regimen.  -Will continue to monitor.      Relevant Medications   WELLBUTRIN SR 150 MG 12 hr tablet     Other   Tobacco abuse   Relevant Medications   WELLBUTRIN SR 150 MG 12 hr tablet   Other Visit Diagnoses     Lower extremity edema       Relevant Medications   furosemide (LASIX) 20 MG tablet   Encounter for tobacco use cessation counseling          Lower extremity edema: -Last CMP 06/30/2021: potassium 4.5, normal. Patient has significant swelling on exam and advised to take furosemide as needed for edema and ensure adequate dietary potassium intake. Recommend to resume compression socks and elevation. Continue low sodium diet.  Tobacco abuse, Encounter for tobacco use cessation counseling: -Smoking cessation instruction/counseling given:  counseled patient on the dangers of tobacco use, advised patient to stop smoking, and reviewed strategies to maximize success -Will start Wellbutrin 150 mg. Advised to take 1 tablet by mouth once daily x 1 week and then take 1 tablet twice daily.      Meds ordered this encounter  Medications   furosemide (LASIX) 20 MG tablet    Sig: Take 1 tablet (20 mg total) by mouth daily as needed for edema.    Dispense:  30 tablet    Refill:  0    Order Specific Question:   Supervising Provider    Answer:   Beatrice Lecher D [[id number]]   WELLBUTRIN SR 150 MG 12 hr tablet    Sig: Take 1 tablet (150 mg total) by mouth 2 (two) times daily.    Dispense:  180 tablet    Refill:  0    Order Specific Question:   Supervising Provider    Answer:   Beatrice Lecher D  [[id number]]    Follow-up: Return in about 3 months (around 11/16/2021) for DM, HTN, Tobacco use and FBW few days prior .    [name], PA-C

## 2021-08-16 NOTE — Assessment & Plan Note (Signed)
-  Stable. -Continue current medication regimen. Will repeat CMP for medication monitoring at follow up visit. -Will continue to monitor.

## 2021-08-28 ENCOUNTER — Other Ambulatory Visit: Payer: Self-pay | Admitting: Physician Assistant

## 2021-08-28 DIAGNOSIS — E1142 Type 2 diabetes mellitus with diabetic polyneuropathy: Secondary | ICD-10-CM

## 2021-09-01 ENCOUNTER — Other Ambulatory Visit: Payer: Self-pay | Admitting: Physician Assistant

## 2021-09-01 DIAGNOSIS — R350 Frequency of micturition: Secondary | ICD-10-CM

## 2021-09-12 ENCOUNTER — Ambulatory Visit: Admit: 2021-09-12 | Payer: PPO | Admitting: Orthopedic Surgery

## 2021-09-12 SURGERY — ARTHROPLASTY, KNEE, TOTAL
Anesthesia: Spinal | Site: Knee | Laterality: Right

## 2021-10-11 ENCOUNTER — Telehealth: Payer: Self-pay | Admitting: Cardiovascular Disease

## 2021-10-11 MED ORDER — APIXABAN 5 MG PO TABS: 5.0000 mg | ORAL_TABLET | Freq: Two times a day (BID) | ORAL | 0 refills | Status: DC

## 2021-10-11 NOTE — Telephone Encounter (Signed)
Samples of Eliquis has been left at the front desk for pick up.

## 2021-10-11 NOTE — Telephone Encounter (Signed)
Patient calling the office for samples of medication:   1.  What medication and dosage are you requesting samples for?  apixaban (ELIQUIS) 5 MG TABS tablet Take 1 tablet by mouth twice daily    2.  Are you currently out of this medication? yes

## 2021-10-11 NOTE — Telephone Encounter (Signed)
Routing back to nl refills to prepare sample and pt assistance form Pt qualifies for eliquis 5mg  bid but must fill out pt assistance form: [email or web address]

## 2021-11-08 ENCOUNTER — Other Ambulatory Visit: Payer: Self-pay | Admitting: Physician Assistant

## 2021-11-08 ENCOUNTER — Other Ambulatory Visit: Payer: Self-pay

## 2021-11-08 DIAGNOSIS — E1159 Type 2 diabetes mellitus with other circulatory complications: Secondary | ICD-10-CM

## 2021-11-08 DIAGNOSIS — R600 Localized edema: Secondary | ICD-10-CM

## 2021-11-08 DIAGNOSIS — E1169 Type 2 diabetes mellitus with other specified complication: Secondary | ICD-10-CM

## 2021-11-08 DIAGNOSIS — Z1329 Encounter for screening for other suspected endocrine disorder: Secondary | ICD-10-CM

## 2021-11-09 ENCOUNTER — Other Ambulatory Visit: Payer: Self-pay

## 2021-11-09 ENCOUNTER — Other Ambulatory Visit: Payer: PPO

## 2021-11-09 DIAGNOSIS — Z1329 Encounter for screening for other suspected endocrine disorder: Secondary | ICD-10-CM | POA: Diagnosis not present

## 2021-11-09 DIAGNOSIS — E1169 Type 2 diabetes mellitus with other specified complication: Secondary | ICD-10-CM | POA: Diagnosis not present

## 2021-11-09 DIAGNOSIS — R600 Localized edema: Secondary | ICD-10-CM | POA: Diagnosis not present

## 2021-11-09 DIAGNOSIS — E782 Mixed hyperlipidemia: Secondary | ICD-10-CM | POA: Diagnosis not present

## 2021-11-09 DIAGNOSIS — E669 Obesity, unspecified: Secondary | ICD-10-CM | POA: Diagnosis not present

## 2021-11-09 DIAGNOSIS — I152 Hypertension secondary to endocrine disorders: Secondary | ICD-10-CM

## 2021-11-09 DIAGNOSIS — E1159 Type 2 diabetes mellitus with other circulatory complications: Secondary | ICD-10-CM | POA: Diagnosis not present

## 2021-11-10 ENCOUNTER — Other Ambulatory Visit: Payer: PPO

## 2021-11-10 LAB — CBC WITH DIFFERENTIAL/PLATELET
Basophils Absolute: 0.1 10*3/uL (ref 0.0–0.2)
Basos: 1 %
EOS (ABSOLUTE): 0.3 10*3/uL (ref 0.0–0.4)
Eos: 3 %
Hematocrit: 43.6 % (ref 37.5–51.0)
Hemoglobin: 14.2 g/dL (ref 13.0–17.7)
Immature Grans (Abs): 0 10*3/uL (ref 0.0–0.1)
Immature Granulocytes: 0 %
Lymphocytes Absolute: 3.4 10*3/uL — ABNORMAL HIGH (ref 0.7–3.1)
Lymphs: 33 %
MCH: 27.8 pg (ref 26.6–33.0)
MCHC: 32.6 g/dL (ref 31.5–35.7)
MCV: 85 fL (ref 79–97)
Monocytes Absolute: 0.7 10*3/uL (ref 0.1–0.9)
Monocytes: 7 %
Neutrophils Absolute: 5.8 10*3/uL (ref 1.4–7.0)
Neutrophils: 56 %
Platelets: 246 10*3/uL (ref 150–450)
RBC: 5.11 x10E6/uL (ref 4.14–5.80)
RDW: 14.1 % (ref 11.6–15.4)
WBC: 10.3 10*3/uL (ref 3.4–10.8)

## 2021-11-10 LAB — COMPREHENSIVE METABOLIC PANEL
ALT: 20 IU/L (ref 0–44)
AST: 20 IU/L (ref 0–40)
Albumin/Globulin Ratio: 1.9 (ref 1.2–2.2)
Albumin: 4.3 g/dL (ref 3.8–4.8)
Alkaline Phosphatase: 110 IU/L (ref 44–121)
BUN/Creatinine Ratio: 20 (ref 10–24)
BUN: 15 mg/dL (ref 8–27)
Bilirubin Total: 0.4 mg/dL (ref 0.0–1.2)
CO2: 23 mmol/L (ref 20–29)
Calcium: 10.1 mg/dL (ref 8.6–10.2)
Chloride: 102 mmol/L (ref 96–106)
Creatinine, Ser: 0.74 mg/dL — ABNORMAL LOW (ref 0.76–1.27)
Globulin, Total: 2.3 g/dL (ref 1.5–4.5)
Glucose: 170 mg/dL — ABNORMAL HIGH (ref 70–99)
Potassium: 4.4 mmol/L (ref 3.5–5.2)
Sodium: 141 mmol/L (ref 134–144)
Total Protein: 6.6 g/dL (ref 6.0–8.5)
eGFR: 98 mL/min/{1.73_m2} (ref >59)

## 2021-11-10 LAB — LIPID PANEL
Chol/HDL Ratio: 3 ratio (ref 0.0–5.0)
Cholesterol, Total: 89 mg/dL — ABNORMAL LOW (ref 100–199)
HDL: 30 mg/dL — ABNORMAL LOW (ref >39)
LDL Chol Calc (NIH): 40 mg/dL (ref 0–99)
Triglycerides: 99 mg/dL (ref 0–149)
VLDL Cholesterol Cal: 19 mg/dL (ref 5–40)

## 2021-11-10 LAB — TSH: TSH: 2.08 u[IU]/mL (ref 0.450–4.500)

## 2021-11-10 LAB — HEMOGLOBIN A1C
Est. average glucose Bld gHb Est-mCnc: 166 mg/dL
Hgb A1c MFr Bld: 7.4 % — ABNORMAL HIGH (ref 4.8–5.6)

## 2021-11-11 ENCOUNTER — Telehealth: Payer: Self-pay | Admitting: Cardiovascular Disease

## 2021-11-11 NOTE — Telephone Encounter (Signed)
Patient calling the office for samples of medication:   1.  What medication and dosage are you requesting samples for? Eliquis  2.  Are you currently out of this medication?  Yes- need them asap

## 2021-11-11 NOTE — Telephone Encounter (Signed)
I spoke to patient who is awaiting Patient assistance.  I gave him 2 boxes of Eliquis 5 mg.

## 2021-11-16 ENCOUNTER — Ambulatory Visit (INDEPENDENT_AMBULATORY_CARE_PROVIDER_SITE_OTHER): Payer: PPO | Admitting: Physician Assistant

## 2021-11-16 ENCOUNTER — Other Ambulatory Visit: Payer: Self-pay

## 2021-11-16 ENCOUNTER — Encounter: Payer: Self-pay | Admitting: Physician Assistant

## 2021-11-16 VITALS — BP 122/72 | HR 74 | Temp 97.6°F | Ht 71.0 in | Wt 231.0 lb

## 2021-11-16 DIAGNOSIS — E1169 Type 2 diabetes mellitus with other specified complication: Secondary | ICD-10-CM | POA: Diagnosis not present

## 2021-11-16 DIAGNOSIS — E782 Mixed hyperlipidemia: Secondary | ICD-10-CM

## 2021-11-16 DIAGNOSIS — L729 Follicular cyst of the skin and subcutaneous tissue, unspecified: Secondary | ICD-10-CM

## 2021-11-16 DIAGNOSIS — R600 Localized edema: Secondary | ICD-10-CM | POA: Diagnosis not present

## 2021-11-16 DIAGNOSIS — Z712 Person consulting for explanation of examination or test findings: Secondary | ICD-10-CM | POA: Diagnosis not present

## 2021-11-16 DIAGNOSIS — Z23 Encounter for immunization: Secondary | ICD-10-CM

## 2021-11-16 DIAGNOSIS — L089 Local infection of the skin and subcutaneous tissue, unspecified: Secondary | ICD-10-CM | POA: Diagnosis not present

## 2021-11-16 DIAGNOSIS — Z720 Tobacco use: Secondary | ICD-10-CM | POA: Diagnosis not present

## 2021-11-16 DIAGNOSIS — E1159 Type 2 diabetes mellitus with other circulatory complications: Secondary | ICD-10-CM

## 2021-11-16 DIAGNOSIS — E669 Obesity, unspecified: Secondary | ICD-10-CM

## 2021-11-16 DIAGNOSIS — I152 Hypertension secondary to endocrine disorders: Secondary | ICD-10-CM

## 2021-11-16 MED ORDER — MUPIROCIN CALCIUM 2 % EX CREA: 1.0000 "application " | TOPICAL_CREAM | Freq: Two times a day (BID) | CUTANEOUS | 0 refills | Status: DC

## 2021-11-16 MED ORDER — WELLBUTRIN SR 150 MG PO TB12: 150.0000 mg | ORAL_TABLET | Freq: Two times a day (BID) | ORAL | 0 refills | Status: DC

## 2021-11-16 NOTE — Assessment & Plan Note (Addendum)
-  A1c has mildly improved from 7.5 to 7.4 (goal 7.0-7.5), will continue current medication. -Recommend to follow low carbohydrate and glucose diet. -Discussed resuming ambulatory glucose monitoring. -Will continue to monitor.

## 2021-11-16 NOTE — Assessment & Plan Note (Signed)
-  Recent lipid panel: LDL 40, at goal. -Continue current medication regimen. Recent hepatic function normal. -Will continue to monitor.

## 2021-11-16 NOTE — Progress Notes (Signed)
Established Patient Office Visit  Subjective:  Patient ID: [name], male    DOB: Nov 25, 1951  Age: 70 y.o. MRN: [id number]  CC:  Chief Complaint  Patient presents with   Follow-up   Diabetes   Hypertension     HPI [name] presents for follow up on diabetes mellitus, hypertension and hyperlipidemia.  Diabetes: Pt denies increased urination or thirst. Pt reports medication compliance. No hypoglycemic events. Has not checked glucose the past 2 weeks.   HTN: Pt denies chest pain, palpitations, dizziness. Reports has not been using compression socks for edema. Not taking Lasix due to urinary frequency. Taking medication as directed without side effects. Has not checked BP at home recently. Pt follows a low salt diet.  HLD: Pt taking medication as directed without issues. Denies side effects including myalgias, muscle weakness or RUQ pain.   Tobacco use: Reports did not get rx from pharmacy. Currently smokes about 1.5 PPD. Wants to cut down to 1/2 PPD.  Past Medical History:  Diagnosis Date   Abdominal aortic aneurysm (AAA)    3 cm per 07-02-18 US abdominal aorta US epic   Arthritis    Blood clot in vein    behind left knee   Chronic kidney disease    renal calculi   Chronic venous insufficiency LOWER EXTREMITIES   Coronary artery disease CARDIOLOGIST - DR  MOLMBEML-  LAST 1 WK AGO -- WILL REQUEST NOTE AND STRESS TEST   Diabetes mellitus without complication ([place])    type 2   Hematuria    last year   Hyperglycemia    Hyperlipidemia    Hypertension    Mixed dyslipidemia    Myocardial infarction ([place])    Neuropathy    toes only   Nocturia    Prostate cancer ([place]) 08/18/11   gleason 8, volume 24.4cc   S/P CABG x 4    ST elevation MI (STEMI) ([place]) 02-10-2008   S/P CABG   Stroke ([place]) 2016   occ trouble wriing with right hand   Urinary hesitancy    Vision abnormalities    resolved now   Wound discharge    since jan 2019 goes to wound center [facility]  left great toe small open area changes dressing q 2 days with ointment provided by wound center, clear drainage occ    Past Surgical History:  Procedure Laterality Date   CARDIAC CATHETERIZATION  05/01/2012   grafts widely patent   CARDIOVASCULAR STRESS TEST  03-15-2010   INFERIOR WALL SCAR WITHOUT ANY MEANINGFUL ISCHEMIA/ EF 46% / LOW RISK SCAN   CATARACT EXTRACTION     CORONARY ARTERY BYPASS GRAFT  02-10-2008  DR Einar Gip   X4 VESSEL DISEASE / EMERGANT CABG   CYSTOSCOPY  02/08/2012   Procedure: CYSTOSCOPY FLEXIBLE;  Surgeon: Franchot Gallo, MD;  [facility];  Service: Urology;;   CYSTOSCOPY WITH RETROGRADE PYELOGRAM, URETEROSCOPY AND STENT PLACEMENT Right 09/12/2018   Procedure: CYSTOSCOPY WITH RETROGRADE PYELOGRAM, URETEROSCOPY AND STENT PLACEMENT, STONE EXTRACTION;  Surgeon: Franchot Gallo, MD;  Location: [facility];  Service: Urology;  Laterality: Right;   EP IMPLANTABLE DEVICE N/A 08/14/2016   Procedure: Loop Recorder Insertion;  Surgeon: Evans Lance, MD;  Location: [facility];  Service: Cardiovascular;  Laterality: N/A;   EXTRACORPOREAL SHOCK WAVE LITHOTRIPSY  2013   HOLMIUM LASER APPLICATION Right 5/44/9201   Procedure: HOLMIUM LASER APPLICATION;  Surgeon: Franchot Gallo, MD;  Location: [facility];  Service: Urology;  Laterality: Right;  KNEE SURGERY  age 48   RIGHT   LEFT HEART CATHETERIZATION WITH CORONARY/GRAFT ANGIOGRAM N/A 05/01/2012   Procedure: LEFT HEART CATHETERIZATION WITH Beatrix Fetters;  Surgeon: Sanda Klein, MD;  Location: [facility];  Service: Cardiovascular;  Laterality: N/A;   MULTIPLE TEETH EXTRACTIONS (23)/ FOUR QUADRANT ALVEOLPLASTY/ MANDIBULAR LATERAL EXOSTOSES REDUCTIONS  03-24-2010   CHRONIC PERIODONTITIS   RADIOACTIVE SEED IMPLANT  02/08/2012   Procedure: RADIOACTIVE SEED IMPLANT;  Surgeon: Franchot Gallo, MD;  Location: [facility];  Service: Urology;  Laterality: N/A;  C-ARM    SHOULDER SURGERY   1982   LEFT   TEE WITHOUT CARDIOVERSION N/A 08/14/2016   Procedure: TRANSESOPHAGEAL ECHOCARDIOGRAM (TEE);  Surgeon: Josue Hector, MD;  Location: [facility];  Service: Cardiovascular;  Laterality: N/A;   URETERAL STENT PLACEMENT      Family History  Problem Relation Age of Onset   Cancer Father        pancreatic   Diabetes Father     Social History   Socioeconomic History   Marital status: Married    Spouse name: Not on file   Number of children: Not on file   Years of education: Not on file   Highest education level: Not on file  Occupational History   Not on file  Tobacco Use   Smoking status: Every Day    Packs/day: 1.00    Years: 50.00    Pack years: 50.00    Types: Cigarettes   Smokeless tobacco: Never   Tobacco comments:    PREVIOUSLY SMOKED 3PPD / DECREASED TO 1PPD SINCE 2009  Vaping Use   Vaping Use: Never used  Substance and Sexual Activity   Alcohol use: Not Currently    Alcohol/week: 2.0 standard drinks    Types: 1 Cans of beer, 1 Shots of liquor per week    Comment: none in 3 years   Drug use: No   Sexual activity: Never  Other Topics Concern   Not on file  Social History Narrative   Not on file   Social Determinants of Health   Financial Resource Strain: Not on file  Food Insecurity: Not on file  Transportation Needs: Not on file  Physical Activity: Not on file  Stress: Not on file  Social Connections: Not on file  Intimate Partner Violence: Not on file    Outpatient Medications Prior to Visit  Medication Sig Dispense Refill   apixaban (ELIQUIS) 5 MG TABS tablet Take 1 tablet (5 mg total) by mouth 2 (two) times daily. 56 tablet 0   atorvastatin (LIPITOR) 10 MG tablet Take 1 tablet by mouth once daily 90 tablet 0   blood glucose meter kit and supplies KIT Dispense based on patient and insurance preference. 1 each 0   Continuous Blood Gluc Receiver (FREESTYLE LIBRE 14 DAY READER) DEVI 1 Device by Does not apply route daily. Use to check  blood sugars every morning and 2 hours after largest meals 1 each 1   Continuous Blood Gluc Sensor (FREESTYLE LIBRE 14 DAY SENSOR) MISC USE TO CHECK BLOOD SUGARS IN THE MORNING FASTING AND  2  HOURS  AFTER  LARGEST  MEAL 6 each 0   diclofenac Sodium (VOLTAREN) 1 % GEL Apply 2 g topically 4 (four) times daily as needed. 50 g 0   finasteride (PROSCAR) 5 MG tablet Take 5 mg by mouth daily.  11   furosemide (LASIX) 20 MG tablet Take 1 tablet (20 mg total) by mouth daily as needed for  edema. 30 tablet 0   gabapentin (NEURONTIN) 300 MG capsule TAKE 2 CAPSULES BY MOUTH IN THE MORNING AND 2 CAPSULES IN THE EVENING AT DINNERTIME 360 capsule 0   glucose blood (FREESTYLE LITE) test strip Use to check blood sugars every morning fasting 100 each 12   lisinopril (ZESTRIL) 5 MG tablet Take 1 tablet (5 mg total) by mouth daily. 90 tablet 1   metFORMIN (GLUCOPHAGE-XR) 500 MG 24 hr tablet TAKE 4 TABLETS BY MOUTH ONCE DAILY WITH BREAKFAST 360 tablet 1   methocarbamol (ROBAXIN) 500 MG tablet Take 1 tablet (500 mg total) by mouth every 8 (eight) hours as needed for muscle spasms. 20 tablet 0   Multiple Vitamin (MULTIVITAMIN WITH MINERALS) TABS tablet Take 1 tablet by mouth daily. Men's One-A-Day Multivitamin     tamsulosin (FLOMAX) 0.4 MG CAPS capsule Take 1 capsule by mouth once daily at bedtime 90 capsule 0   doxycycline (VIBRA-TABS) 100 MG tablet Take 1 tablet (100 mg total) by mouth 2 (two) times daily. 20 tablet 0   WELLBUTRIN SR 150 MG 12 hr tablet Take 1 tablet (150 mg total) by mouth 2 (two) times daily. 180 tablet 0   No facility-administered medications prior to visit.    Allergies  Allergen Reactions   Bee Venom Anaphylaxis   Penicillins Other (See Comments)    CAUSES "FREE BLEEDING" Has patient had a PCN reaction causing immediate rash, facial/tongue/throat swelling, SOB or lightheadedness with hypotension: No Has patient had a PCN reaction causing severe rash involving mucus membranes or skin  necrosis: No Has patient had a PCN reaction that required hospitalization No Has patient had a PCN reaction occurring within the last 10 years: No If all of the above answers are "NO", then may proceed with Cephalosporin use.    ROS Review of Systems Review of Systems:  A fourteen system review of systems was performed and found to be positive as per HPI.   Objective:    Physical Exam General:  Well Developed, well nourished, appropriate for stated age.  Neuro:  Alert and oriented,  extra-ocular muscles intact  HEENT:  Normocephalic, atraumatic, neck supple, no carotid bruits appreciated  Skin:  small erythematous and tender lump posterior of left upper leg Cardiac:  RRR, S1 S2 Respiratory: CTA B/L, Not using accessory muscles, speaking in full sentences- unlabored. Vascular:  Ext warm, no cyanosis apprec.; cap RF less 2 sec. Trace of edema LLE, 1+ pitting edema of RLE Psych:  No HI/SI, judgement and insight good, Euthymic mood. Full Affect.  BP 122/72   Pulse 74   Temp 97.6 F (36.4 C)   Ht 5' 11"  (1.803 m)   Wt 231 lb (104.8 kg)   SpO2 98%   BMI 32.22 kg/m  Wt Readings from Last 3 Encounters:  11/16/21 231 lb (104.8 kg)  08/16/21 229 lb 12.8 oz (104.2 kg)  06/30/21 221 lb 11.2 oz (100.6 kg)     Health Maintenance Due  Topic Date Due   Hepatitis C Screening  Never done   Zoster Vaccines- Shingrix (1 of 2) Never done   Pneumonia Vaccine 26+ Years old (2 - PPSV23 if available, else PCV20) 11/27/2018   COVID-19 Vaccine (3 - Pfizer risk series) 03/01/2020    There are no preventive care reminders to display for this patient.  Lab Results  Component Value Date   TSH 2.080 11/09/2021   Lab Results  Component Value Date   WBC 10.3 11/09/2021   HGB 14.2 11/09/2021  HCT 43.6 11/09/2021   MCV 85 11/09/2021   PLT 246 11/09/2021   Lab Results  Component Value Date   NA 141 11/09/2021   K 4.4 11/09/2021   CO2 23 11/09/2021   GLUCOSE 170 (H) 11/09/2021   BUN 15  11/09/2021   CREATININE 0.74 (L) 11/09/2021   BILITOT 0.4 11/09/2021   ALKPHOS 110 11/09/2021   AST 20 11/09/2021   ALT 20 11/09/2021   PROT 6.6 11/09/2021   ALBUMIN 4.3 11/09/2021   CALCIUM 10.1 11/09/2021   ANIONGAP 11 09/04/2018   EGFR 98 11/09/2021   Lab Results  Component Value Date   CHOL 89 (L) 11/09/2021   Lab Results  Component Value Date   HDL 30 (L) 11/09/2021   Lab Results  Component Value Date   LDLCALC 40 11/09/2021   Lab Results  Component Value Date   TRIG 99 11/09/2021   Lab Results  Component Value Date   CHOLHDL 3.0 11/09/2021   Lab Results  Component Value Date   HGBA1C 7.4 (H) 11/09/2021      Assessment & Plan:   Problem List Items Addressed This Visit       Cardiovascular and Mediastinum   Hypertension associated with diabetes ([place])    -Stable. -Continue current medication regimen. -Recent CMP stable. -Will continue to monitor.        Endocrine   Diabetes mellitus type 2 in obese ([place]) - Primary    -A1c has mildly improved from 7.5 to 7.4 (goal 7.0-7.5), will continue current medication. -Recommend to follow low carbohydrate and glucose diet. -Discussed resuming ambulatory glucose monitoring. -Will continue to monitor.      Mixed diabetic hyperlipidemia associated with type 2 diabetes mellitus ([facility])    -Recent lipid panel: LDL 40, at goal. -Continue current medication regimen. Recent hepatic function normal. -Will continue to monitor.        Other   Tobacco abuse   Relevant Medications   WELLBUTRIN SR 150 MG 12 hr tablet   Other Visit Diagnoses     Need for influenza vaccination       Relevant Orders   Flu Vaccine QUAD High Dose(Fluad) (Completed)   Infected cyst of skin       Relevant Medications   mupirocin cream (BACTROBAN) 2 %      Lower extremity edema: -Discussed with patient resuming compression socks. Reports unable to tolerate diuretic therapy. Recommend to continue with low sodium diet.  Infected  cyst of skin: -No purulent drainage noted on exam, recommend to apply topical antibiotic- mupirocin twice daily x 7-14 days. Apply warm compresses. Follow up if symptoms fail to improve or worsen.  Tobacco abuse:   -Will resend rx for Wellbutrin SR 100 mg BID and titrate dose accordingly. -Recommend to continue with reduction efforts. -Will continue to monitor and reassess at follow up visit.   Discussed most recent labs which are essentially within normal limits or stable from prior. CBC stable, WBC normal and lymphocytes improving. CMP- kidney function stable, electrolytes and liver function normal.   Meds ordered this encounter  Medications   mupirocin cream (BACTROBAN) 2 %    Sig: Apply 1 application topically 2 (two) times daily.    Dispense:  15 g    Refill:  0    Order Specific Question:   Supervising Provider    Answer:   Beatrice Lecher D [[id number]]   WELLBUTRIN SR 150 MG 12 hr tablet    Sig: Take 1 tablet (150 mg total) by  mouth 2 (two) times daily.    Dispense:  180 tablet    Refill:  0    Order Specific Question:   Supervising Provider    Answer:   Beatrice Lecher D [[id number]]     Follow-up: Return in about 4 months (around 03/16/2022) for [place] and [place] .    [name], PA-C

## 2021-11-16 NOTE — Assessment & Plan Note (Signed)
-  Stable. -Continue current medication regimen. -Recent CMP stable. -Will continue to monitor.

## 2021-11-16 NOTE — Patient Instructions (Signed)

## 2021-12-01 ENCOUNTER — Other Ambulatory Visit: Payer: Self-pay | Admitting: Physician Assistant

## 2021-12-01 DIAGNOSIS — E1142 Type 2 diabetes mellitus with diabetic polyneuropathy: Secondary | ICD-10-CM

## 2021-12-12 ENCOUNTER — Other Ambulatory Visit: Payer: Self-pay | Admitting: Physician Assistant

## 2021-12-12 DIAGNOSIS — R350 Frequency of micturition: Secondary | ICD-10-CM

## 2021-12-13 ENCOUNTER — Telehealth: Payer: Self-pay | Admitting: Cardiovascular Disease

## 2021-12-13 DIAGNOSIS — I480 Paroxysmal atrial fibrillation: Secondary | ICD-10-CM

## 2021-12-13 MED ORDER — APIXABAN 5 MG PO TABS: 5.0000 mg | ORAL_TABLET | Freq: Two times a day (BID) | ORAL | 0 refills | Status: DC

## 2021-12-13 NOTE — Telephone Encounter (Signed)
Patient calling the office for samples of medication:   1.  What medication and dosage are you requesting samples for? Eliquis  2.  Are you currently out of this medication? yes    

## 2021-12-13 NOTE — Telephone Encounter (Signed)
Prescription refill request for Eliquis received. Indication: a fib Last office visit: 03/15/21 Scr: 0.74 Age: 70 Weight: 104kg

## 2021-12-14 MED ORDER — APIXABAN 5 MG PO TABS: 5.0000 mg | ORAL_TABLET | Freq: Two times a day (BID) | ORAL | 0 refills | Status: DC

## 2021-12-14 NOTE — Telephone Encounter (Signed)
Pt called back in and said the copay for his eliquis was to high so I gave him sapmles and a pt assistance form. Prescription refill request for Eliquis received. Indication: a fib Last office visit: 03/15/21 Scr: 0.74 Age: 70 Weight: 104kg

## 2021-12-14 NOTE — Addendum Note (Signed)
Addended by: Allean Found on: 12/14/2021 09:47 AM   Modules accepted: Orders

## 2021-12-21 ENCOUNTER — Other Ambulatory Visit: Payer: Self-pay | Admitting: Physician Assistant

## 2021-12-21 DIAGNOSIS — E1129 Type 2 diabetes mellitus with other diabetic kidney complication: Secondary | ICD-10-CM

## 2021-12-21 DIAGNOSIS — E1159 Type 2 diabetes mellitus with other circulatory complications: Secondary | ICD-10-CM

## 2021-12-28 ENCOUNTER — Telehealth: Payer: Self-pay | Admitting: Physician Assistant

## 2021-12-28 ENCOUNTER — Telehealth: Payer: Self-pay | Admitting: Cardiovascular Disease

## 2021-12-28 ENCOUNTER — Other Ambulatory Visit: Payer: Self-pay

## 2021-12-28 DIAGNOSIS — I480 Paroxysmal atrial fibrillation: Secondary | ICD-10-CM

## 2021-12-28 DIAGNOSIS — C61 Malignant neoplasm of prostate: Secondary | ICD-10-CM

## 2021-12-28 MED ORDER — APIXABAN 5 MG PO TABS
5.0000 mg | ORAL_TABLET | Freq: Two times a day (BID) | ORAL | 1 refills | Status: DC
  Filled 2021-12-29: qty 180, 90d supply, fill #0

## 2021-12-28 NOTE — Telephone Encounter (Signed)
Referral placed and patient notified.  

## 2021-12-28 NOTE — Telephone Encounter (Signed)
Prescription refill request for Eliquis received. Indication:Afib Last office visit:3/22 Scr:0.7 Age: 71 Weight:104.8 kg  Prescription refilled

## 2021-12-28 NOTE — Telephone Encounter (Signed)
° ° °*  STAT* If patient is at the pharmacy, call can be transferred to refill team.   1. Which medications need to be refilled? (please list name of each medication and dose if known) apixaban (ELIQUIS) 5 MG TABS tablet  2. Which pharmacy/location (including street and city if local pharmacy) is medication to be sent to?[facility]  3. Do they need a 30 day or 90 day supply? 90 days  Pt only have 1 pill left, needs refill today

## 2021-12-28 NOTE — Telephone Encounter (Signed)
Patient called and stated he is currently seeing [facility] in [place] however he would like a referral to [facility] in [place]. Please advise. [phone]

## 2021-12-29 ENCOUNTER — Other Ambulatory Visit (HOSPITAL_COMMUNITY): Payer: Self-pay

## 2021-12-30 ENCOUNTER — Other Ambulatory Visit (HOSPITAL_COMMUNITY): Payer: Self-pay

## 2022-01-10 NOTE — Progress Notes (Signed)
01/11/22 11:11 AM   [name] 02-Dec-1951 [id number]  Referring provider:  [name], PA-C [place],  [place] [address] Chief Complaint  Patient presents with   New Patient (Initial Visit)     HPI: [name] is a 71 y.o.male who presents today for further evaluation of prostate cancer.   He was followed by urology at [facility] [place] he was last seen in 2019.   He is seeking to transfer care due to economic reasons.  He has a personal history of gross hematuria, renal calculus, benign tumor on left kidney, prostate cancer, and other extensive GU issues.   He is s/p ESWL in 2014. He reports that he has never passed a stone he always had stone surgery, he never had any pain with his stones.  He is s/p cystolitholapaxy of bladder calcification in 2019.   He was evaluated for urothelial malignancy of the right renal pelvis which showed no evidence for malignancy in 2019.   He is s/p ADT on 06/26/2018. He is also s/p radiotherapy (brachytherapy) for his prostate cancer. PSA was undetectable 8 months ago.  He has CT abdomen and pelvis on 03/27/2021 that visualized Normal adrenals. No right renal stones. Nonobstructing 2 mm upper left renal stone. No hydronephrosis. No contour deforming renal masses. Normal caliber ureters. No ureteral stones. Normal bladder.  He reports bladder urgency and frequency and burning during urination. Burning is in his urethra every time he urinates. He reports that burning has been ongoing ever since radiation seeds.  He is a current tobacco user, has a pack of cigarettes in this front pocket today.   He reports today that he had an incident where he was stung by a yellow jacket in his scrotum. He went to urology and the stinger was still in his scrotum and his PSA was tested at the time and that is how he was diagnosed with prostate cancer.  Dr. Diona Fanti called him "Bull"    PMH: Past Medical History:   Diagnosis Date   Abdominal aortic aneurysm (AAA)    3 cm per 07-02-18 US abdominal aorta US epic   Arthritis    Blood clot in vein    behind left knee   Chronic kidney disease    renal calculi   Chronic venous insufficiency LOWER EXTREMITIES   Coronary artery disease CARDIOLOGIST - DR  [id number]-  LAST 1 WK AGO -- WILL REQUEST NOTE AND STRESS TEST   Diabetes mellitus without complication ([place])    type 2   Hematuria    last year   Hyperglycemia    Hyperlipidemia    Hypertension    Mixed dyslipidemia    Myocardial infarction ([place])    Neuropathy    toes only   Nocturia    Prostate cancer ([place]) 08/18/11   gleason 8, volume 24.4cc   S/P CABG x 4    ST elevation MI (STEMI) ([place]) 02-10-2008   S/P CABG   Stroke ([place]) 2016   occ trouble wriing with right hand   Urinary hesitancy    Vision abnormalities    resolved now   Wound discharge    since jan 2019 goes to wound center [place] left great toe small open area changes dressing q 2 days with ointment provided by wound center, clear drainage occ    Surgical History: Past Surgical History:  Procedure Laterality Date   CARDIAC CATHETERIZATION  05/01/2012   grafts widely patent   CARDIOVASCULAR  STRESS TEST  03-15-2010   INFERIOR WALL SCAR WITHOUT ANY MEANINGFUL ISCHEMIA/ EF 46% / LOW RISK SCAN   CATARACT EXTRACTION     CORONARY ARTERY BYPASS GRAFT  02-10-2008  DR Einar Gip   X4 VESSEL DISEASE / [facility] CABG   CYSTOSCOPY  02/08/2012   Procedure: CYSTOSCOPY FLEXIBLE;  Surgeon: Franchot Gallo, MD;  [facility];  Service: Urology;;   CYSTOSCOPY WITH RETROGRADE PYELOGRAM, URETEROSCOPY AND STENT PLACEMENT Right 09/12/2018   Procedure: CYSTOSCOPY WITH RETROGRADE PYELOGRAM, URETEROSCOPY AND STENT PLACEMENT, STONE EXTRACTION;  Surgeon: Franchot Gallo, MD;  Location: [facility];  Service: Urology;  Laterality: Right;   EP IMPLANTABLE DEVICE N/A 08/14/2016   Procedure: Loop Recorder Insertion;  Surgeon: Evans Lance, MD;  Location: [facility];  Service: Cardiovascular;  Laterality: N/A;   EXTRACORPOREAL SHOCK WAVE LITHOTRIPSY  2013   HOLMIUM LASER APPLICATION Right 0/08/3817   Procedure: HOLMIUM LASER APPLICATION;  Surgeon: Franchot Gallo, MD;  Location: [facility];  Service: Urology;  Laterality: Right;   KNEE SURGERY  age 38   RIGHT   LEFT HEART CATHETERIZATION WITH CORONARY/GRAFT ANGIOGRAM N/A 05/01/2012   Procedure: LEFT HEART CATHETERIZATION WITH Beatrix Fetters;  Surgeon: Sanda Klein, MD;  Location: [place] CATH LAB;  Service: Cardiovascular;  Laterality: N/A;   MULTIPLE TEETH EXTRACTIONS (23)/ FOUR QUADRANT ALVEOLPLASTY/ MANDIBULAR LATERAL EXOSTOSES REDUCTIONS  03-24-2010   CHRONIC PERIODONTITIS   RADIOACTIVE SEED IMPLANT  02/08/2012   Procedure: RADIOACTIVE SEED IMPLANT;  Surgeon: Franchot Gallo, MD;  Location: [facility];  Service: Urology;  Laterality: N/A;  C-ARM    SHOULDER SURGERY  1982   LEFT   TEE WITHOUT CARDIOVERSION N/A 08/14/2016   Procedure: TRANSESOPHAGEAL ECHOCARDIOGRAM (TEE);  Surgeon: Josue Hector, MD;  Location: [facility] ENDOSCOPY;  Service: Cardiovascular;  Laterality: N/A;   URETERAL STENT PLACEMENT      Home Medications:  Allergies as of 01/11/2022       Reactions   Bee Venom Anaphylaxis   Penicillins Other (See Comments)   CAUSES "FREE BLEEDING" Has patient had a PCN reaction causing immediate rash, facial/tongue/throat swelling, SOB or lightheadedness with hypotension: No Has patient had a PCN reaction causing severe rash involving mucus membranes or skin necrosis: No Has patient had a PCN reaction that required hospitalization No Has patient had a PCN reaction occurring within the last 10 years: No If all of the above answers are "NO", then may proceed with Cephalosporin use.        Medication List        Accurate as of January 11, 2022 11:11 AM. If you have any questions, ask your nurse or doctor.          atorvastatin  10 MG tablet Commonly known as: LIPITOR Take 1 tablet by mouth once daily   blood glucose meter kit and supplies Kit Dispense based on patient and insurance preference.   diclofenac Sodium 1 % Gel Commonly known as: Voltaren Apply 2 g topically 4 (four) times daily as needed.   Eliquis 5 MG Tabs tablet Generic drug: apixaban Take 1 tablet (5 mg total) by mouth 2 (two) times daily.   finasteride 5 MG tablet Commonly known as: PROSCAR Take 5 mg by mouth daily.   FreeStyle Libre 14 Day Reader Kerrin Mo 1 Device by Does not apply route daily. Use to check blood sugars every morning and 2 hours after largest meals   FreeStyle Libre 14 Day Sensor Misc USE TO CHECK BLOOD SUGARS IN THE MORNING  FASTING AND  2  HOURS  AFTER  LARGEST  MEAL   furosemide 20 MG tablet Commonly known as: LASIX Take 1 tablet (20 mg total) by mouth daily as needed for edema.   gabapentin 300 MG capsule Commonly known as: NEURONTIN TAKE 2 CAPSULES BY MOUTH IN THE MORNING AND 2 IN THE EVENING AT  DINNERTIME   glucose blood test strip Commonly known as: FREESTYLE LITE Use to check blood sugars every morning fasting   lisinopril 5 MG tablet Commonly known as: ZESTRIL Take 1 tablet by mouth once daily   metFORMIN 500 MG 24 hr tablet Commonly known as: GLUCOPHAGE-XR TAKE 4 TABLETS BY MOUTH ONCE DAILY WITH BREAKFAST   methocarbamol 500 MG tablet Commonly known as: ROBAXIN Take 1 tablet (500 mg total) by mouth every 8 (eight) hours as needed for muscle spasms.   multivitamin with minerals Tabs tablet Take 1 tablet by mouth daily. Men's One-A-Day Multivitamin   mupirocin cream 2 % Commonly known as: Bactroban Apply 1 application topically 2 (two) times daily.   tamsulosin 0.4 MG Caps capsule Commonly known as: FLOMAX Take 1 capsule by mouth once daily at bedtime   Wellbutrin SR 150 MG 12 hr tablet Generic drug: buPROPion Take 1 tablet (150 mg total) by mouth 2 (two) times daily.        Allergies:   Allergies  Allergen Reactions   Bee Venom Anaphylaxis   Penicillins Other (See Comments)    CAUSES "FREE BLEEDING" Has patient had a PCN reaction causing immediate rash, facial/tongue/throat swelling, SOB or lightheadedness with hypotension: No Has patient had a PCN reaction causing severe rash involving mucus membranes or skin necrosis: No Has patient had a PCN reaction that required hospitalization No Has patient had a PCN reaction occurring within the last 10 years: No If all of the above answers are "NO", then may proceed with Cephalosporin use.    Family History: Family History  Problem Relation Age of Onset   Cancer Father        pancreatic   Diabetes Father     Social History:  reports that he has been smoking cigarettes. He has a 50.00 pack-year smoking history. He has never used smokeless tobacco. He reports that he does not currently use alcohol after a past usage of about 2.0 standard drinks per week. He reports that he does not use drugs.   Physical Exam: BP (!) 143/84    Pulse 76    Ht _0  (1.803 m)    Wt 231 lb (104.8 kg)    BMI 32.22 kg/m   Constitutional:  Alert and oriented, No acute distress.  Accompanied by male family member today. HEENT: [place] AT, moist mucus membranes.  Trachea midline, no masses. Cardiovascular: No clubbing, cyanosis, or edema. Respiratory: Normal respiratory effort, no increased work of breathing. Skin: No rashes, bruises or suspicious lesions. Neurologic: Grossly intact, no focal deficits, moving all 4 extremities. Psychiatric: Normal mood and affect.  Laboratory Data:  Lab Results  Component Value Date   CREATININE 0.74 (L) 11/09/2021   Lab Results  Component Value Date   HGBA1C 7.4 (H) 11/09/2021    Urinalysis - Unremarkable   Pertinent Imaging: Results for orders placed or performed in visit on 01/11/22  BLADDER SCAN AMB NON-IMAGING  Result Value Ref Range   Scan Result 0     Assessment & Plan:    Prostate  cancer  - Will continue to monitor PSA on bi-annual basis  - PSA;pending  History of kidney stones  - CT in April 2022 did not show any significant stone burden   BPH with LUTS  -Offered him cystoscopy/TRUS to review anatomy of his prostate. We discussed this procedure in detail.  -Currently on medical therapy with Flomax, finasteride/ ADT discontinued after radiation - Will have to be careful due to his history of radiation - Discussed addition of OAB medication.  - Prescribed Mybetriq 50 mg  today  (x 1 mo samples) for irritative / storage related symptoms  Return for cystoscopy/TRUS  [place] [address] [place], [place] [address] [phone]  I have reviewed the above documentation for accuracy and completeness, and I agree with the above.   Records from [facility] reviewed today.  Hollice Espy, MD

## 2022-01-11 ENCOUNTER — Other Ambulatory Visit: Payer: Self-pay

## 2022-01-11 ENCOUNTER — Ambulatory Visit: Payer: PPO | Admitting: Urology

## 2022-01-11 ENCOUNTER — Encounter: Payer: Self-pay | Admitting: Urology

## 2022-01-11 VITALS — BP 143/84 | HR 76 | Ht 71.0 in | Wt 231.0 lb

## 2022-01-11 DIAGNOSIS — M170 Bilateral primary osteoarthritis of knee: Secondary | ICD-10-CM | POA: Diagnosis not present

## 2022-01-11 DIAGNOSIS — C61 Malignant neoplasm of prostate: Secondary | ICD-10-CM

## 2022-01-11 DIAGNOSIS — R350 Frequency of micturition: Secondary | ICD-10-CM | POA: Diagnosis not present

## 2022-01-11 DIAGNOSIS — Z87442 Personal history of urinary calculi: Secondary | ICD-10-CM | POA: Diagnosis not present

## 2022-01-11 LAB — BLADDER SCAN AMB NON-IMAGING: Scan Result: 0

## 2022-01-11 MED ORDER — MIRABEGRON ER 50 MG PO TB24: 50.0000 mg | ORAL_TABLET | Freq: Every day | ORAL | 0 refills | Status: DC

## 2022-01-11 NOTE — Patient Instructions (Signed)
Cystoscopy Cystoscopy is a procedure that is used to help diagnose and sometimes treat conditions that affect the lower urinary tract. The lower urinary tract includes the bladder and the urethra. The urethra is the tube that drains urine from the bladder. Cystoscopy is done using a thin, tube-shaped instrument with a light and camera at the end (cystoscope). The cystoscope may be hard or flexible, depending on the goal of the procedure. The cystoscope is inserted through the urethra, into the bladder. Cystoscopy may be recommended if you have: Urinary tract infections that keep coming back. Blood in the urine (hematuria). An inability to control when you urinate (urinary incontinence) or an overactive bladder. Unusual cells found in a urine sample. A blockage in the urethra, such as a urinary stone. Painful urination. An abnormality in the bladder found during an intravenous pyelogram (IVP) or CT scan. What are the risks? Generally, this is a safe procedure. However, problems may occur, including: Infection. Bleeding.  What happens during the procedure?  You will be given one or more of the following: A medicine to numb the area (local anesthetic). The area around the opening of your urethra will be cleaned. The cystoscope will be passed through your urethra into your bladder. Germ-free (sterile) fluid will flow through the cystoscope to fill your bladder. The fluid will stretch your bladder so that your health care provider can clearly examine your bladder walls. Your doctor will look at the urethra and bladder. The cystoscope will be removed The procedure may vary among health care providers  What can I expect after the procedure? After the procedure, it is common to have: Some soreness or pain in your urethra. Urinary symptoms. These include: Mild pain or burning when you urinate. Pain should stop within a few minutes after you urinate. This may last for up to a few days after the  procedure. A small amount of blood in your urine for several days. Feeling like you need to urinate but producing only a small amount of urine. Follow these instructions at home: General instructions Return to your normal activities as told by your health care provider.  Drink plenty of fluids after the procedure. Keep all follow-up visits as told by your health care provider. This is important. Contact a health care provider if you: Have pain that gets worse or does not get better with medicine, especially pain when you urinate lasting longer than 72 hours after the procedure. Have trouble urinating. Get help right away if you: Have blood clots in your urine. Have a fever or chills. Are unable to urinate. Summary Cystoscopy is a procedure that is used to help diagnose and sometimes treat conditions that affect the lower urinary tract. Cystoscopy is done using a thin, tube-shaped instrument with a light and camera at the end. After the procedure, it is common to have some soreness or pain in your urethra. It is normal to have blood in your urine after the procedure.  If you were prescribed an antibiotic medicine, take it as told by your health care provider.  This information is not intended to replace advice given to you by your health care provider. Make sure you discuss any questions you have with your health care provider. Document Revised: 12/03/2018 Document Reviewed: 12/03/2018 Elsevier Patient Education  [place].   Transrectal Ultrasound A transrectal ultrasound is a procedure that uses sound waves to create images of the prostate gland and nearby tissues. For this procedure, an ultrasound probe is placed in  the rectum. The probe sends sound waves through the wall of the rectum into the prostate gland. The prostate is a walnut-sized gland that is located below the bladder and in front of the rectum. The images show the size and shape of the prostate gland and nearby  structures. You may need this test if you have: Trouble urinating. Trouble getting your partner pregnant (infertility). An abnormal result from a prostate screening exam. Tell a health care provider about: Any allergies you have. All medicines you are taking, including vitamins, herbs, eye drops, creams, and over-the-counter medicines. Any bleeding problems you have. Any surgeries you have had. Any medical conditions you have. Any prostate infections you have had. What are the risks? Generally, this is a safe procedure. However, problems may occur, including: Discomfort during the procedure. Blood in your urine or sperm after the procedure. This may occur if a sample of tissue is taken to look at under a microscope (biopsy) during the procedure. What happens before the procedure? Your health care provider may instruct you to use an enema 1-4 hours before the procedure. Follow instructions from your health care provider about how to do the enema. Ask your health care provider about: Changing or stopping your regular medicines. This is especially important if you are taking diabetes medicines or blood thinners. Taking medicines such as aspirin and ibuprofen. These medicines can thin your blood. Do not take these medicines unless your health care provider tells you to take them. Taking over-the-counter medicines, vitamins, herbs, and supplements. What happens during the procedure? You will be asked to lie down on your left side on an exam table. You will bend your knees toward your chest. Gel will be put on a small probe that is about the width of a finger. The probe will be gently inserted into your rectum. You may have a feeling of fullness but should not feel pain. The probe will send signals to a computer that will create images. These will be displayed on a monitor that looks like a small television screen. The technician will slightly rotate the probe throughout the procedure. While  rotating the probe, he or she will view and capture images of the prostate gland and the surrounding structures from different angles. Your health care provider may take a biopsy sample of prostate tissue during the procedure. The images captured from the ultrasound will help guide the needle that is used to remove a sample of tissue. The sample will be sent to a lab for testing. The probe will be removed. The procedure may vary among health care providers and hospitals. What can I expect after the procedure? It is up to you to get the results of your procedure. Ask your health care provider, or the department that is doing the procedure, when your results will be ready. Keep all follow-up visits. This is important. Summary A transrectal ultrasound is a procedure that uses sound waves to create images of the prostate gland and nearby tissues. The images show the size and shape of the prostate gland and nearby structures. Before the procedure, ask your health care provider about changing or stopping your regular medicines. This is especially important if you are taking diabetes medicines or blood thinners. This information is not intended to replace advice given to you by your health care provider. Make sure you discuss any questions you have with your health care provider. Document Revised: 08/24/2021 Document Reviewed: 06/06/2021 Elsevier Patient Education  [place].

## 2022-01-12 LAB — MICROSCOPIC EXAMINATION: Bacteria, UA: NONE SEEN

## 2022-01-12 LAB — URINALYSIS, COMPLETE
Bilirubin, UA: NEGATIVE
Ketones, UA: NEGATIVE
Leukocytes,UA: NEGATIVE
Nitrite, UA: NEGATIVE
Specific Gravity, UA: 1.02 (ref 1.005–1.030)
Urobilinogen, Ur: 0.2 mg/dL (ref 0.2–1.0)
pH, UA: 5.5 (ref 5.0–7.5)

## 2022-01-12 LAB — PSA: Prostate Specific Ag, Serum: <0.1 ng/mL (ref 0.0–4.0)

## 2022-01-16 ENCOUNTER — Telehealth: Payer: Self-pay | Admitting: *Deleted

## 2022-01-16 NOTE — Telephone Encounter (Signed)
The provider's portion of Eliquis assistance has been faxed to [facility] The patient has taken care of his portion.

## 2022-01-26 ENCOUNTER — Encounter: Payer: Self-pay | Admitting: Podiatry

## 2022-01-26 ENCOUNTER — Ambulatory Visit: Payer: PPO | Admitting: Podiatry

## 2022-01-26 ENCOUNTER — Ambulatory Visit (INDEPENDENT_AMBULATORY_CARE_PROVIDER_SITE_OTHER): Payer: PPO

## 2022-01-26 ENCOUNTER — Other Ambulatory Visit: Payer: Self-pay

## 2022-01-26 DIAGNOSIS — M722 Plantar fascial fibromatosis: Secondary | ICD-10-CM

## 2022-01-26 MED ORDER — TRIAMCINOLONE ACETONIDE 10 MG/ML IJ SUSP
10.0000 mg | Freq: Once | INTRAMUSCULAR | Status: AC
  Administered 2022-01-26: 10 mg

## 2022-01-26 NOTE — Progress Notes (Signed)
Subjective:   Patient ID: [name], male   DOB: 71 y.o.   MRN: [id number]   HPI Patient presents stating my right arch has been sore and its been bothering me for several months.  I am very active I have orthotics that are 70 years old and I know I need new orthotics   ROS      Objective:  Physical Exam  Neurovascular status intact with exquisite discomfort in the mid arch area right medial band with fluid buildup within the medial band of the fascia     Assessment:  Acute fasciitis of the mid band right     Plan:  H&P reviewed condition sterile prep and injected the mid arch right 3 mg Kenalog 5 mg Xylocaine and went ahead and casted for new functional orthotics.  Dispensed fascial brace instructed on physical therapy and reappoint to recheck when orthotics ready  X-rays indicate no signs of collapse of the arch with spur formation

## 2022-01-27 ENCOUNTER — Telehealth: Payer: Self-pay | Admitting: Podiatry

## 2022-01-27 NOTE — Telephone Encounter (Signed)
Received [employer]TA Auth # [id number] for orthotics (P3295 X2) and  PF brace( L1902 x1) valid 2.2.23 thru 5.3.2023

## 2022-01-30 NOTE — Progress Notes (Signed)
° °  01/31/2022 CC:  Chief Complaint  Patient presents with   Cysto     HPI: [name] is a 71 y.o. male with a personal history of prostate cancer, kidney stones, and BPH with LUTS, who presents today for a cystoscopy.   He was followed by urology at [facility] [place] he was last seen in 2019.   He is seeking to transfer care due to economic reasons.   He has a personal history of gross hematuria, renal calculus, benign tumor on left kidney, prostate cancer, and other extensive GU issues.    He is s/p ESWL in 2014. He reports that he has never passed a stone he always had stone surgery, he never had any pain with his stones.   He is s/p cystolitholapaxy of bladder calcification in 2019.    He was evaluated for urothelial malignancy of the right renal pelvis which showed no evidence for malignancy in 2019.    He is s/p ADT on 06/26/2018. He is also s/p radiotherapy (brachytherapy) for his prostate cancer. PSA was undetectable 8 months ago  He reports that his frequency has improved on medication.   He reports that he has had increase diarrhea which his unsure his due to his metformin or Mybetriq.   Vitals:   01/31/22 1548  BP: 122/78  Pulse: 91  NED. A&Ox3.   No respiratory distress   Abd soft, NT, ND Normal phallus with bilateral descended testicles  Cystoscopy Procedure Note  Patient identification was confirmed, informed consent was obtained, and patient was prepped using Betadine solution.  Lidocaine jelly was administered per urethral meatus.     Pre-Procedure: - Inspection reveals a normal caliber ureteral meatus.  Procedure: The flexible cystoscope was introduced without difficulty - No urethral strictures/lesions are present. - Normal prostate  - Normal bladder neck - Bilateral ureteral orifices identified - Bladder mucosa  reveals no ulcers, tumors, or lesions - No bladder stones - No trabeculation - Increased vascularity diffusely consistent  with radiation changes   Retroflexion shows unremarkable    Post-Procedure: - Patient tolerated the procedure well   Prostate transrectal ultrasound sizing   Informed consent was obtained after discussing risks/benefits of the procedure.  A time out was performed to ensure correct patient identity.   Pre-Procedure: -Transrectal probe was placed without difficulty -Transrectal Ultrasound performed revealing a 20.5 gm prostate measuring 2.52 x 3.98 x 3.90 cm (length) -No significant hypoechoic or median lobe noted   Assessment/ Plan:  Dysuria/urinary frequency/urgency - Urgency frequency likely related to radiation symptoms  - He has had improvement of Myrbetriq recommend he continue this medication. - Continue Flomax and discontinue finasteride   - Prescription for Myrbetriq sent today  -Urine cytology today as a precaution, no evidence of underlying bladder pathology cystoscopically   2. Prostate cancer  - Will continue to monitor PSA on bi-annual basis  - PSA;pending    3. History of kidney stones  - CT in April 2022 did not show any significant stone burden    Return in 3 months   I,Kailey Littlejohn,acting as a Education administrator for Hollice Espy, MD.,have documented all relevant documentation on the behalf of Hollice Espy, MD,as directed by  Hollice Espy, MD while in the presence of Hollice Espy, MD.  I have reviewed the above documentation for accuracy and completeness, and I agree with the above.   Hollice Espy, MD

## 2022-01-31 ENCOUNTER — Other Ambulatory Visit: Payer: Self-pay

## 2022-01-31 ENCOUNTER — Ambulatory Visit: Payer: PPO | Admitting: Urology

## 2022-01-31 DIAGNOSIS — R350 Frequency of micturition: Secondary | ICD-10-CM

## 2022-01-31 DIAGNOSIS — C61 Malignant neoplasm of prostate: Secondary | ICD-10-CM

## 2022-01-31 DIAGNOSIS — Z87442 Personal history of urinary calculi: Secondary | ICD-10-CM

## 2022-01-31 MED ORDER — MIRABEGRON ER 50 MG PO TB24: 50.0000 mg | ORAL_TABLET | Freq: Every day | ORAL | 11 refills | Status: DC

## 2022-02-02 LAB — CYTOLOGY - NON PAP

## 2022-02-14 DIAGNOSIS — Z7984 Long term (current) use of oral hypoglycemic drugs: Secondary | ICD-10-CM | POA: Diagnosis not present

## 2022-02-14 DIAGNOSIS — I714 Abdominal aortic aneurysm, without rupture, unspecified: Secondary | ICD-10-CM | POA: Diagnosis not present

## 2022-02-14 DIAGNOSIS — E119 Type 2 diabetes mellitus without complications: Secondary | ICD-10-CM | POA: Diagnosis not present

## 2022-02-14 DIAGNOSIS — I480 Paroxysmal atrial fibrillation: Secondary | ICD-10-CM | POA: Diagnosis not present

## 2022-02-14 DIAGNOSIS — Z7901 Long term (current) use of anticoagulants: Secondary | ICD-10-CM | POA: Diagnosis not present

## 2022-02-16 ENCOUNTER — Other Ambulatory Visit: Payer: Self-pay | Admitting: Physician Assistant

## 2022-02-27 DIAGNOSIS — H16201 Unspecified keratoconjunctivitis, right eye: Secondary | ICD-10-CM | POA: Diagnosis not present

## 2022-02-27 DIAGNOSIS — Z961 Presence of intraocular lens: Secondary | ICD-10-CM | POA: Diagnosis not present

## 2022-03-01 ENCOUNTER — Telehealth: Payer: Self-pay | Admitting: Podiatry

## 2022-03-01 NOTE — Telephone Encounter (Signed)
Lvm for patient to call and schedule an appointment to pick up orthotics  ?

## 2022-03-02 ENCOUNTER — Other Ambulatory Visit: Payer: Self-pay

## 2022-03-02 ENCOUNTER — Ambulatory Visit: Payer: PPO

## 2022-03-02 DIAGNOSIS — M722 Plantar fascial fibromatosis: Secondary | ICD-10-CM

## 2022-03-02 NOTE — Progress Notes (Signed)

## 2022-03-08 ENCOUNTER — Other Ambulatory Visit: Payer: Self-pay | Admitting: Physician Assistant

## 2022-03-08 DIAGNOSIS — E1142 Type 2 diabetes mellitus with diabetic polyneuropathy: Secondary | ICD-10-CM

## 2022-03-21 ENCOUNTER — Encounter: Payer: Self-pay | Admitting: Physician Assistant

## 2022-03-21 ENCOUNTER — Other Ambulatory Visit: Payer: Self-pay

## 2022-03-21 ENCOUNTER — Ambulatory Visit (INDEPENDENT_AMBULATORY_CARE_PROVIDER_SITE_OTHER): Payer: PPO | Admitting: Physician Assistant

## 2022-03-21 VITALS — BP 121/74 | HR 64 | Temp 97.4°F | Ht 71.0 in | Wt 229.0 lb

## 2022-03-21 DIAGNOSIS — Z0000 Encounter for general adult medical examination without abnormal findings: Secondary | ICD-10-CM

## 2022-03-21 DIAGNOSIS — I152 Hypertension secondary to endocrine disorders: Secondary | ICD-10-CM

## 2022-03-21 DIAGNOSIS — Z1329 Encounter for screening for other suspected endocrine disorder: Secondary | ICD-10-CM

## 2022-03-21 DIAGNOSIS — E669 Obesity, unspecified: Secondary | ICD-10-CM

## 2022-03-21 DIAGNOSIS — R350 Frequency of micturition: Secondary | ICD-10-CM | POA: Diagnosis not present

## 2022-03-21 DIAGNOSIS — E782 Mixed hyperlipidemia: Secondary | ICD-10-CM | POA: Diagnosis not present

## 2022-03-21 DIAGNOSIS — E1159 Type 2 diabetes mellitus with other circulatory complications: Secondary | ICD-10-CM

## 2022-03-21 DIAGNOSIS — Z122 Encounter for screening for malignant neoplasm of respiratory organs: Secondary | ICD-10-CM | POA: Diagnosis not present

## 2022-03-21 DIAGNOSIS — Z13228 Encounter for screening for other metabolic disorders: Secondary | ICD-10-CM

## 2022-03-21 DIAGNOSIS — Z1211 Encounter for screening for malignant neoplasm of colon: Secondary | ICD-10-CM

## 2022-03-21 DIAGNOSIS — Z1321 Encounter for screening for nutritional disorder: Secondary | ICD-10-CM

## 2022-03-21 DIAGNOSIS — Z130 Encounter for screening for diseases of the blood and blood-forming organs and certain disorders involving the immune mechanism: Secondary | ICD-10-CM | POA: Diagnosis not present

## 2022-03-21 DIAGNOSIS — F17200 Nicotine dependence, unspecified, uncomplicated: Secondary | ICD-10-CM

## 2022-03-21 DIAGNOSIS — E1169 Type 2 diabetes mellitus with other specified complication: Secondary | ICD-10-CM | POA: Diagnosis not present

## 2022-03-21 DIAGNOSIS — Z1212 Encounter for screening for malignant neoplasm of rectum: Secondary | ICD-10-CM | POA: Diagnosis not present

## 2022-03-21 LAB — POCT GLYCOSYLATED HEMOGLOBIN (HGB A1C): Hemoglobin A1C: 9.3 % — AB (ref 4.0–5.6)

## 2022-03-21 MED ORDER — DAPAGLIFLOZIN PROPANEDIOL 10 MG PO TABS
10.0000 mg | ORAL_TABLET | Freq: Every day | ORAL | 0 refills | Status: DC
Start: 2022-03-21 — End: 2022-05-15

## 2022-03-21 MED ORDER — METFORMIN HCL ER 500 MG PO TB24: 500.0000 mg | ORAL_TABLET | Freq: Every day | ORAL | 0 refills | Status: DC

## 2022-03-21 MED ORDER — FREESTYLE LIBRE 14 DAY SENSOR MISC: 0 refills | Status: DC

## 2022-03-21 NOTE — Patient Instructions (Signed)
Shingles Vaccines 2 shots ?Prevnar 23 to complete Pneumonia shot series ? ?Preventive Care 20 Years and Older, Male ?Preventive care refers to lifestyle choices and visits with your health care provider that can promote health and wellness. Preventive care visits are also called wellness exams. ?What can I expect for my preventive care visit? ?Counseling ?During your preventive care visit, your health care provider may ask about your: ?Medical history, including: ?Past medical problems. ?Family medical history. ?History of falls. ?Current health, including: ?Emotional well-being. ?Home life and relationship well-being. ?Sexual activity. ?Memory and ability to understand (cognition). ?Lifestyle, including: ?Alcohol, nicotine or tobacco, and drug use. ?Access to firearms. ?Diet, exercise, and sleep habits. ?Work and work Statistician. ?Sunscreen use. ?Safety issues such as seatbelt and bike helmet use. ?Physical exam ?Your health care provider will check your: ?Height and weight. These may be used to calculate your BMI (body mass index). BMI is a measurement that tells if you are at a healthy weight. ?Waist circumference. This measures the distance around your waistline. This measurement also tells if you are at a healthy weight and may help predict your risk of certain diseases, such as type 2 diabetes and high blood pressure. ?Heart rate and blood pressure. ?Body temperature. ?Skin for abnormal spots. ?What immunizations do I need? ?Vaccines are usually given at various ages, according to a schedule. Your health care provider will recommend vaccines for you based on your age, medical history, and lifestyle or other factors, such as travel or where you work. ?What tests do I need? ?Screening ?Your health care provider may recommend screening tests for certain conditions. This may include: ?Lipid and cholesterol levels. ?Diabetes screening. This is done by checking your blood sugar (glucose) after you have not eaten  for a while (fasting). ?Hepatitis C test. ?Hepatitis B test. ?HIV (human immunodeficiency virus) test. ?STI (sexually transmitted infection) testing, if you are at risk. ?Lung cancer screening. ?Colorectal cancer screening. ?Prostate cancer screening. ?Abdominal aortic aneurysm (AAA) screening. You may need this if you are a current or former smoker. ?Talk with your health care provider about your test results, treatment options, and if necessary, the need for more tests. ?Follow these instructions at home: ?Eating and drinking ? ?Eat a diet that includes fresh fruits and vegetables, whole grains, lean protein, and low-fat dairy products. Limit your intake of foods with high amounts of sugar, saturated fats, and salt. ?Take vitamin and mineral supplements as recommended by your health care provider. ?Do not drink alcohol if your health care provider tells you not to drink. ?If you drink alcohol: ?Limit how much you have to 0-2 drinks a day. ?Know how much alcohol is in your drink. In the [place] one drink equals one 12 oz bottle of beer (355 mL), one 5 oz glass of wine (148 mL), or one 1? oz glass of hard liquor (44 mL). ?Lifestyle ?Brush your teeth every morning and night with fluoride toothpaste. Floss one time each day. ?Exercise for at least 30 minutes 5 or more days each week. ?Do not use any products that contain nicotine or tobacco. These products include cigarettes, chewing tobacco, and vaping devices, such as e-cigarettes. If you need help quitting, ask your health care provider. ?Do not use drugs. ?If you are sexually active, practice safe sex. Use a condom or other form of protection to prevent STIs. ?Take aspirin only as told by your health care provider. Make sure that you understand how much to take and what form to take. Work with  your health care provider to find out whether it is safe and beneficial for you to take aspirin daily. ?Ask your health care provider if you need to take a  cholesterol-lowering medicine (statin). ?Find healthy ways to manage stress, such as: ?Meditation, yoga, or listening to music. ?Journaling. ?Talking to a trusted person. ?Spending time with friends and family. ?Safety ?Always wear your seat belt while driving or riding in a vehicle. ?Do not drive: ?If you have been drinking alcohol. Do not ride with someone who has been drinking. ?When you are tired or distracted. ?While texting. ?If you have been using any mind-altering substances or drugs. ?Wear a helmet and other protective equipment during sports activities. ?If you have firearms in your house, make sure you follow all gun safety procedures. ?Minimize exposure to UV radiation to reduce your risk of skin cancer. ?What's next? ?Visit your health care provider once a year for an annual wellness visit. ?Ask your health care provider how often you should have your eyes and teeth checked. ?Stay up to date on all vaccines. ?This information is not intended to replace advice given to you by your health care provider. Make sure you discuss any questions you have with your health care provider. ?Document Revised: 06/08/2021 Document Reviewed: 06/08/2021 ?Elsevier Patient Education ? [place]. ? ?

## 2022-03-21 NOTE — Progress Notes (Signed)
? ?Subjective:  ? ABDULKADIR [name] is a 71 y.o. male who presents for Medicare Annual/Subsequent preventive examination. ? ?Review of Systems    ?General:   No F/C, wt loss ?Pulm:   No DIB, SOB, pleuritic chest pain ?Card:  No CP, palpitations ?Abd:  No n/v/d or pain ?Ext:  No inc edema from baseline ? ?   ?Objective:  ?  ?Today's Vitals  ? 03/21/22 0903  ?BP: 121/74  ?Pulse: 64  ?Temp: (!) 97.4 ?F (36.3 ?C)  ?SpO2: 98%  ?Weight: 229 lb (103.9 kg)  ?Height: _0  (1.803 m)  ? ?Body mass index is 31.94 kg/m?. ? ? ?  03/27/2021  ?  8:17 AM 04/25/2019  ? 10:47 PM 04/15/2019  ? 10:10 AM 09/12/2018  ?  9:01 AM 09/04/2018  ? 10:01 AM 11/27/2017  ?  1:06 PM 08/15/2016  ?  1:17 PM  ?Advanced Directives  ?Does Patient Have a Medical Advance Directive? No No Yes No No No No  ?Type of Scientist, physiological of [place];Living will      ?Does patient want to make changes to medical advance directive?  No - Patient declined No - Patient declined      ?Would patient like information on creating a medical advance directive?  No - Patient declined  No - Patient declined No - Patient declined Yes (MAU/Ambulatory/Procedural Areas - Information given)   ? ? ?Current Medications (verified) ?Outpatient Encounter Medications as of 03/21/2022  ?Medication Sig  ? apixaban (ELIQUIS) 5 MG TABS tablet Take 1 tablet (5 mg total) by mouth 2 (two) times daily.  ? atorvastatin (LIPITOR) 10 MG tablet Take 1 tablet by mouth once daily  ? blood glucose meter kit and supplies KIT Dispense based on patient and insurance preference.  ? Continuous Blood Gluc Receiver (FREESTYLE LIBRE 14 DAY READER) DEVI 1 Device by Does not apply route daily. Use to check blood sugars every morning and 2 hours after largest meals  ? dapagliflozin propanediol (FARXIGA) 10 MG TABS tablet Take 1 tablet (10 mg total) by mouth daily before breakfast.  ? diclofenac Sodium (VOLTAREN) 1 % GEL Apply 2 g topically 4 (four) times daily as needed.  ? furosemide (LASIX) 20 MG  tablet Take 1 tablet (20 mg total) by mouth daily as needed for edema.  ? gabapentin (NEURONTIN) 300 MG capsule TAKE 2 CAPSULES BY MOUTH IN THE MORNING AND 2 IN THE EVENING AT  DINNERTIME  ? glucose blood (FREESTYLE LITE) test strip Use to check blood sugars every morning fasting  ? lisinopril (ZESTRIL) 5 MG tablet Take 1 tablet by mouth once daily  ? methocarbamol (ROBAXIN) 500 MG tablet Take 1 tablet (500 mg total) by mouth every 8 (eight) hours as needed for muscle spasms.  ? mirabegron ER (MYRBETRIQ) 50 MG TB24 tablet Take 1 tablet (50 mg total) by mouth daily.  ? Multiple Vitamin (MULTIVITAMIN WITH MINERALS) TABS tablet Take 1 tablet by mouth daily. Men's One-A-Day Multivitamin  ? mupirocin cream (BACTROBAN) 2 % Apply 1 application topically 2 (two) times daily.  ? tamsulosin (FLOMAX) 0.4 MG CAPS capsule Take 1 capsule by mouth once daily at bedtime  ? WELLBUTRIN SR 150 MG 12 hr tablet Take 1 tablet (150 mg total) by mouth 2 (two) times daily.  ? [DISCONTINUED] Continuous Blood Gluc Sensor (FREESTYLE LIBRE 14 DAY SENSOR) MISC USE TO CHECK BLOOD SUGARS IN THE MORNING FASTING AND  2  HOURS  AFTER  LARGEST  MEAL  ? [  DISCONTINUED] metFORMIN (GLUCOPHAGE-XR) 500 MG 24 hr tablet TAKE 4 TABLETS BY MOUTH ONCE DAILY WITH BREAKFAST  ? Continuous Blood Gluc Sensor (FREESTYLE LIBRE 14 DAY SENSOR) MISC USE TO CHECK BLOOD SUGARS IN THE MORNING FASTING AND  2  HOURS  AFTER  LARGEST  MEAL  ? metFORMIN (GLUCOPHAGE-XR) 500 MG 24 hr tablet Take 1 tablet (500 mg total) by mouth daily with breakfast.  ? ?No facility-administered encounter medications on file as of 03/21/2022.  ? ? ?Allergies (verified) ?Bee venom and Penicillins  ? ?History: ?Past Medical History:  ?Diagnosis Date  ? Abdominal aortic aneurysm (AAA)   ? 3 cm per 07-02-18 US abdominal aorta US epic  ? Arthritis   ? Blood clot in vein   ? behind left knee  ? Chronic kidney disease   ? renal calculi  ? Chronic venous insufficiency LOWER EXTREMITIES  ? Coronary artery  disease CARDIOLOGIST - DR  HERDEYCX-  LAST 1 WK AGO -- WILL REQUEST NOTE AND STRESS TEST  ? Diabetes mellitus without complication ([place])   ? type 2  ? Hematuria   ? last year  ? Hyperglycemia   ? Hyperlipidemia   ? Hypertension   ? Mixed dyslipidemia   ? Myocardial infarction [facility])   ? Neuropathy   ? toes only  ? Nocturia   ? Prostate cancer ([place]) 08/18/11  ? gleason 8, volume 24.4cc  ? S/P CABG x 4   ? ST elevation MI (STEMI) ([place]) 02-10-2008  ? S/P CABG  ? Stroke [facility]) 2016  ? occ trouble wriing with right hand  ? Urinary hesitancy   ? Vision abnormalities   ? resolved now  ? Wound discharge   ? since jan 2019 goes to wound center [place] left great toe small open area changes dressing q 2 days with ointment provided by wound center, clear drainage occ  ? ?Past Surgical History:  ?Procedure Laterality Date  ? CARDIAC CATHETERIZATION  05/01/2012  ? grafts widely patent  ? CARDIOVASCULAR STRESS TEST  03-15-2010  ? INFERIOR WALL SCAR WITHOUT ANY MEANINGFUL ISCHEMIA/ EF 46% / LOW RISK SCAN  ? CATARACT EXTRACTION    ? CORONARY ARTERY BYPASS GRAFT  02-10-2008  DR Einar Gip  ? X4 VESSEL DISEASE / EMERGANT CABG  ? CYSTOSCOPY  02/08/2012  ? Procedure: CYSTOSCOPY FLEXIBLE;  Surgeon: Franchot Gallo, MD;  [facility];  Service: Urology;;  ? [place], URETEROSCOPY AND STENT PLACEMENT Right 09/12/2018  ? Procedure: CYSTOSCOPY WITH RETROGRADE PYELOGRAM, URETEROSCOPY AND STENT PLACEMENT, STONE EXTRACTION;  Surgeon: Franchot Gallo, MD;  Location: [facility];  Service: Urology;  Laterality: Right;  ? EP IMPLANTABLE DEVICE N/A 08/14/2016  ? Procedure: Loop Recorder Insertion;  Surgeon: Evans Lance, MD;  Location: [place] CV LAB;  Service: Cardiovascular;  Laterality: N/A;  ? EXTRACORPOREAL SHOCK WAVE LITHOTRIPSY  2013  ? HOLMIUM LASER APPLICATION Right 4/48/1856  ? Procedure: HOLMIUM LASER APPLICATION;  Surgeon: Franchot Gallo, MD;  Location: [facility];  Service: Urology;   Laterality: Right;  ? KNEE SURGERY  age 45  ? RIGHT  ? LEFT HEART CATHETERIZATION WITH CORONARY/GRAFT ANGIOGRAM N/A 05/01/2012  ? Procedure: LEFT HEART CATHETERIZATION WITH Beatrix Fetters;  Surgeon: Sanda Klein, MD;  Location: [facility] C[facility];  Service: Cardiovascular;  Laterality: N/A;  ? MULTIPLE TEETH EXTRACTIONS (23)/ FOUR QUADRANT ALVEOLPLASTY/ MANDIBULAR LATERAL EXOSTOSES REDUCTIONS  03-24-2010  ? CHRONIC PERIODONTITIS  ? RADIOACTIVE SEED IMPLANT  02/08/2012  ? Procedure: RADIOACTIVE SEED IMPLANT;  Surgeon: Franchot Gallo,  MD;  Location: [place];  Service: Urology;  Laterality: N/A;  C-ARM ?  ? [place]  ? LEFT  ? TEE WITHOUT CARDIOVERSION N/A 08/14/2016  ? Procedure: TRANSESOPHAGEAL ECHOCARDIOGRAM (TEE);  Surgeon: Josue Hector, MD;  Location: [place];  Service: Cardiovascular;  Laterality: N/A;  ? URETERAL STENT PLACEMENT    ? ?Family History  ?Problem Relation Age of Onset  ? Cancer Father   ?     pancreatic  ? Diabetes Father   ? ?Social History  ? ?Socioeconomic History  ? Marital status: Married  ?  Spouse name: Not on file  ? Number of children: Not on file  ? Years of education: Not on file  ? Highest education level: Not on file  ?Occupational History  ? Not on file  ?Tobacco Use  ? Smoking status: Every Day  ?  Packs/day: 1.00  ?  Years: 50.00  ?  Pack years: 50.00  ?  Types: Cigarettes  ? Smokeless tobacco: Never  ? Tobacco comments:  ?  PREVIOUSLY SMOKED 3PPD / DECREASED TO 1PPD SINCE 2009  ?Vaping Use  ? Vaping Use: Never used  ?Substance and Sexual Activity  ? Alcohol use: Not Currently  ?  Alcohol/week: 2.0 standard drinks  ?  Types: 1 Cans of beer, 1 Shots of liquor per week  ?  Comment: none in 3 years  ? Drug use: No  ? Sexual activity: Never  ?Other Topics Concern  ? Not on file  ?Social History Narrative  ? Not on file  ? ?Social Determinants of Health  ? ?Financial Resource Strain: Not on file  ?Food Insecurity: Not on file  ?Transportation  Needs: Not on file  ?Physical Activity: Not on file  ?Stress: Not on file  ?Social Connections: Not on file  ? ? ?Tobacco Counseling ?Ready to quit: Not Answered ?Counseling given: Not Answered ?Tobacco comments

## 2022-03-22 ENCOUNTER — Ambulatory Visit: Payer: PPO | Admitting: Podiatry

## 2022-03-22 DIAGNOSIS — L02619 Cutaneous abscess of unspecified foot: Secondary | ICD-10-CM | POA: Diagnosis not present

## 2022-03-22 DIAGNOSIS — M722 Plantar fascial fibromatosis: Secondary | ICD-10-CM

## 2022-03-22 DIAGNOSIS — L03119 Cellulitis of unspecified part of limb: Secondary | ICD-10-CM

## 2022-03-22 LAB — LIPID PANEL
Chol/HDL Ratio: 2.8 ratio (ref 0.0–5.0)
Cholesterol, Total: 77 mg/dL — ABNORMAL LOW (ref 100–199)
HDL: 28 mg/dL — ABNORMAL LOW (ref >39)
LDL Chol Calc (NIH): 31 mg/dL (ref 0–99)
Triglycerides: 87 mg/dL (ref 0–149)
VLDL Cholesterol Cal: 18 mg/dL (ref 5–40)

## 2022-03-22 LAB — COMPREHENSIVE METABOLIC PANEL
ALT: 16 IU/L (ref 0–44)
AST: 14 IU/L (ref 0–40)
Albumin/Globulin Ratio: 1.9 (ref 1.2–2.2)
Albumin: 4.4 g/dL (ref 3.8–4.8)
Alkaline Phosphatase: 136 IU/L — ABNORMAL HIGH (ref 44–121)
BUN/Creatinine Ratio: 18 (ref 10–24)
BUN: 13 mg/dL (ref 8–27)
Bilirubin Total: 0.4 mg/dL (ref 0.0–1.2)
CO2: 22 mmol/L (ref 20–29)
Calcium: 9.2 mg/dL (ref 8.6–10.2)
Chloride: 101 mmol/L (ref 96–106)
Creatinine, Ser: 0.72 mg/dL — ABNORMAL LOW (ref 0.76–1.27)
Globulin, Total: 2.3 g/dL (ref 1.5–4.5)
Glucose: 188 mg/dL — ABNORMAL HIGH (ref 70–99)
Potassium: 4.5 mmol/L (ref 3.5–5.2)
Sodium: 136 mmol/L (ref 134–144)
Total Protein: 6.7 g/dL (ref 6.0–8.5)
eGFR: 98 mL/min/{1.73_m2} (ref >59)

## 2022-03-22 LAB — CBC WITH DIFFERENTIAL/PLATELET
Basophils Absolute: 0.1 10*3/uL (ref 0.0–0.2)
Basos: 0 %
EOS (ABSOLUTE): 0.2 10*3/uL (ref 0.0–0.4)
Eos: 1 %
Hematocrit: 44.5 % (ref 37.5–51.0)
Hemoglobin: 15 g/dL (ref 13.0–17.7)
Immature Grans (Abs): 0 10*3/uL (ref 0.0–0.1)
Immature Granulocytes: 0 %
Lymphocytes Absolute: 3.9 10*3/uL — ABNORMAL HIGH (ref 0.7–3.1)
Lymphs: 35 %
MCH: 28.7 pg (ref 26.6–33.0)
MCHC: 33.7 g/dL (ref 31.5–35.7)
MCV: 85 fL (ref 79–97)
Monocytes Absolute: 0.7 10*3/uL (ref 0.1–0.9)
Monocytes: 7 %
Neutrophils Absolute: 6.4 10*3/uL (ref 1.4–7.0)
Neutrophils: 57 %
Platelets: 221 10*3/uL (ref 150–450)
RBC: 5.22 x10E6/uL (ref 4.14–5.80)
RDW: 14.8 % (ref 11.6–15.4)
WBC: 11.3 10*3/uL — ABNORMAL HIGH (ref 3.4–10.8)

## 2022-03-22 LAB — TSH: TSH: 1.61 u[IU]/mL (ref 0.450–4.500)

## 2022-03-22 MED ORDER — DOXYCYCLINE HYCLATE 100 MG PO TABS: 100.0000 mg | ORAL_TABLET | Freq: Two times a day (BID) | ORAL | 0 refills | Status: DC

## 2022-03-22 NOTE — Progress Notes (Signed)
Subjective:  ? ?Patient ID: [name], male   DOB: 71 y.o.   MRN: [id number]  ? ?HPI ?Patient presents stating that overall his arch is feeling pretty good but he is concerned about some friction with a slight opening in the area that may have been due to orthotic or activity or wearing the brace during the healing. ? ? ?ROS ? ? ?   ?Objective:  ?Physical Exam  ?Neurovascular status intact with a small opening in the plantar medial aspect of the right arch measuring about 5 x 5 mm localized with light erythema no active drainage no proximal edema erythema or drainage noted ? ?   ?Assessment:  ?Abrasion of the right arch which may have slight infection localized with no breakdown through underlying tissue layers ? ?   ?Plan:  ?I padded around the area I discussed soaks and I placed on antibiotics doxycycline as precautionary measure.  Gave strict instructions if any increased erythema drainage were to occur to let us know immediately and I will have him see Aaron Edelman to try to see if we can make his orthotics fit better for him ?   ? ? ?

## 2022-03-23 ENCOUNTER — Ambulatory Visit: Payer: PPO

## 2022-03-23 DIAGNOSIS — M722 Plantar fascial fibromatosis: Secondary | ICD-10-CM

## 2022-03-23 DIAGNOSIS — E669 Obesity, unspecified: Secondary | ICD-10-CM

## 2022-03-23 NOTE — Progress Notes (Signed)
SITUATION ?Reason for Consult: Follow-up with custom foot orthotics ?Patient / Caregiver Report: Patient reports he has had pain in his navicular and is concerned due to diabetes ? ?OBJECTIVE DATA ?History / Diagnosis:  ?  ICD-10-CM   ?1. Plantar fasciitis  M72.2   ?  ?2. Diabetes mellitus type 2 in obese (HCC)  E11.69   ? E66.9   ?  ? ? ?Change in Pathology: Pain in navicular ? ?ACTIONS PERFORMED ?Patient's equipment was checked for structural stability and fit. Orthotics are to be sent back to [place] for remake based on options from May 2016 model [id number] All questions answered and concerns addressed. ? ?PLAN ?Follow-up as needed (PRN). Plan of care discussed with and agreed upon by patient / caregiver. ? ?

## 2022-03-28 ENCOUNTER — Ambulatory Visit (HOSPITAL_COMMUNITY)
Admission: RE | Admit: 2022-03-28 | Discharge: 2022-03-28 | Disposition: A | Discharge status: Home / Self Care | Payer: PPO | Source: Ambulatory Visit | Attending: Cardiovascular Disease | Admitting: Cardiovascular Disease

## 2022-03-28 DIAGNOSIS — I739 Peripheral vascular disease, unspecified: Secondary | ICD-10-CM | POA: Diagnosis not present

## 2022-03-29 ENCOUNTER — Encounter: Payer: Self-pay | Admitting: *Deleted

## 2022-03-29 ENCOUNTER — Other Ambulatory Visit: Payer: Self-pay | Admitting: *Deleted

## 2022-03-29 DIAGNOSIS — I739 Peripheral vascular disease, unspecified: Secondary | ICD-10-CM

## 2022-04-04 ENCOUNTER — Telehealth: Payer: Self-pay | Admitting: Cardiovascular Disease

## 2022-04-04 DIAGNOSIS — I480 Paroxysmal atrial fibrillation: Secondary | ICD-10-CM

## 2022-04-04 MED ORDER — APIXABAN 5 MG PO TABS: 5.0000 mg | ORAL_TABLET | Freq: Two times a day (BID) | ORAL | 1 refills | Status: DC

## 2022-04-04 NOTE — Telephone Encounter (Signed)
? ?  Patient calling the office for samples of medication: ? ? ?1.  What medication and dosage are you requesting samples for? ? ?apixaban (ELIQUIS) 5 MG TABS tablet ? ?2.  Are you currently out of this medication? Yes, took last one last night ?  ?

## 2022-04-04 NOTE — Telephone Encounter (Signed)
Left message  - patient can pick up samples  ? ?  ?

## 2022-04-10 ENCOUNTER — Other Ambulatory Visit: Payer: Self-pay | Admitting: Physician Assistant

## 2022-04-10 DIAGNOSIS — R350 Frequency of micturition: Secondary | ICD-10-CM

## 2022-04-17 ENCOUNTER — Encounter (HOSPITAL_BASED_OUTPATIENT_CLINIC_OR_DEPARTMENT_OTHER): Payer: PPO | Attending: General Surgery | Admitting: General Surgery

## 2022-04-17 DIAGNOSIS — I872 Venous insufficiency (chronic) (peripheral): Secondary | ICD-10-CM | POA: Diagnosis not present

## 2022-04-17 DIAGNOSIS — E1151 Type 2 diabetes mellitus with diabetic peripheral angiopathy without gangrene: Secondary | ICD-10-CM | POA: Insufficient documentation

## 2022-04-17 DIAGNOSIS — L97822 Non-pressure chronic ulcer of other part of left lower leg with fat layer exposed: Secondary | ICD-10-CM | POA: Insufficient documentation

## 2022-04-17 DIAGNOSIS — E11622 Type 2 diabetes mellitus with other skin ulcer: Secondary | ICD-10-CM | POA: Diagnosis not present

## 2022-04-17 DIAGNOSIS — I2510 Atherosclerotic heart disease of native coronary artery without angina pectoris: Secondary | ICD-10-CM | POA: Diagnosis not present

## 2022-04-17 NOTE — Progress Notes (Signed)
MIKEAL, WINSTANLEY ([id number]) ?Marland Kitchen ?Visit Report for 04/17/2022 ?Abuse Risk Screen Details ?Patient Name: Date of Service: ?Marcus Apple M T. 04/17/2022 8:00 A M ?Medical Record Number: [id number] ?Patient Account Number: [id number] ?Date of Birth/Sex: Treating RN: ?12-20-1951 (71 y.o. [name]) Dellie Catholic ?Primary Care Shirlena Brinegar: [name] Other Clinician: ?Referring Sherisa Gilvin: ?Treating Lamin Chandley/Extender: Fredirick Maudlin ?[name] ?Weeks in Treatment: 0 ?Abuse Risk Screen Items ?Answer ?ABUSE RISK SCREEN: ?Has anyone close to you tried to hurt or harm you recentlyo No ?Do you feel uncomfortable with anyone in your familyo No ?Has anyone forced you do things that you didnt want to doo No ?Electronic Signature(s) ?Signed: 04/17/2022 3:35:21 PM By: Dellie Catholic RN ?Entered By: Dellie Catholic on 04/17/2022 08:26:06 ?-------------------------------------------------------------------------------- ?Activities of Daily Living Details ?Patient Name: Date of Service: ?Marcus Apple M T. 04/17/2022 8:00 A M ?Medical Record Number: [id number] ?Patient Account Number: [id number] ?Date of Birth/Sex: Treating RN: ?1951/03/08 (71 y.o. [name]) Dellie Catholic ?Primary Care Alvon Nygaard: [name] Other Clinician: ?Referring Sam Wunschel: ?Treating Evia Goldsmith/Extender: Fredirick Maudlin ?[name] ?Weeks in Treatment: 0 ?Activities of Daily Living Items ?Answer ?Activities of Daily Living (Please select one for each item) ?[place] ?T Medications ?ake Completely Able ?Use T elephone Completely Able ?Care for Appearance Completely Able ?Use T oilet Completely Able ?Bath / Shower Completely Able ?Dress Self Completely Able ?Feed Self Completely Able ?Walk Completely Able ?Get In / Out Bed Completely Able ?Housework Completely Able ?Prepare Meals Completely Able ?Handle Money Completely Able ?Shop for Self Completely Able ?Electronic Signature(s) ?Signed: 04/17/2022 3:35:21 PM By: Dellie Catholic RN ?Entered  By: Dellie Catholic on 04/17/2022 08:27:16 ?-------------------------------------------------------------------------------- ?Education Screening Details ?Patient Name: ?Date of Service: ?Marcus Ileana Ladd M T. 04/17/2022 8:00 A M ?Medical Record Number: [id number] ?Patient Account Number: [id number] ?Date of Birth/Sex: ?Treating RN: ?03/11/1951 (71 y.o. [name]) Dellie Catholic ?Primary Care Emberley Kral: [name] ?Other Clinician: ?Referring Jalaiyah Throgmorton: ?Treating Shamar Engelmann/Extender: Fredirick Maudlin ?[name] ?Weeks in Treatment: 0 ?Learning Preferences/Education Level/Primary Language ?Learning Preference: Explanation, Demonstration, Printed Material ?Highest Education Level: College or Above ?Preferred Language: English ?Cognitive Barrier ?Language Barrier: No ?Translator Needed: No ?Memory Deficit: No ?Emotional Barrier: No ?Cultural/Religious Beliefs Affecting Medical Care: No ?Physical Barrier ?Impaired Vision: No ?Impaired Hearing: No ?Decreased Hand dexterity: No ?Knowledge/Comprehension ?Knowledge Level: High ?Comprehension Level: High ?Ability to understand written instructions: High ?Ability to understand verbal instructions: High ?Motivation ?Anxiety Level: Calm ?Cooperation: Cooperative ?Education Importance: Acknowledges Need ?Interest in Health Problems: Asks Questions ?Perception: Coherent ?Willingness to Engage in Self-Management High ?Activities: ?Readiness to Engage in Self-Management High ?Activities: ?Electronic Signature(s) ?Signed: 04/17/2022 3:35:21 PM By: Dellie Catholic RN ?Entered By: Dellie Catholic on 04/17/2022 08:28:03 ?-------------------------------------------------------------------------------- ?Fall Risk Assessment Details ?Patient Name: ?Date of Service: ?Marcus Ileana Ladd M T. 04/17/2022 8:00 A M ?Medical Record Number: [id number] ?Patient Account Number: [id number] ?Date of Birth/Sex: ?Treating RN: ?1951/09/08 (71 y.o. [name]) Dellie Catholic ?Primary Care Naftuli Dalsanto: [name] ?Other  Clinician: ?Referring Hieu Herms: ?Treating Amayrani Bennick/Extender: Fredirick Maudlin ?[name] ?Weeks in Treatment: 0 ?Fall Risk Assessment Items ?Have you had 2 or more falls in the last 12 monthso 0 No ?Have you had any fall that resulted in injury in the last 12 monthso 0 Yes ?FALLS RISK SCREEN ?History of falling - immediate or within 3 months 0 No ?Secondary diagnosis (Do you have 2 or more medical diagnoseso) 0 No ?Ambulatory aid ?None/bed rest/wheelchair/nurse 0 No ?Crutches/cane/walker 0 No ?Furniture 0 No ?Intravenous therapy Access/Saline/Heparin Lock 0 No ?Gait/Transferring ?Normal/ bed rest/  wheelchair 0 No ?Weak (short steps with or without shuffle, stooped but able to lift head while walking, may seek 0 No ?support from furniture) ?Impaired (short steps with shuffle, may have difficulty arising from chair, head down, impaired 0 No ?balance) ?Mental Status ?Oriented to own ability 0 No ?Electronic Signature(s) ?Signed: 04/17/2022 3:35:21 PM By: Dellie Catholic RN ?Entered By: Dellie Catholic on 04/17/2022 08:29:18 ?-------------------------------------------------------------------------------- ?Foot Assessment Details ?Patient Name: ?Date of Service: ?Marcus Ileana Ladd M T. 04/17/2022 8:00 A M ?Medical Record Number: [id number] ?Patient Account Number: [id number] ?Date of Birth/Sex: ?Treating RN: ?1951-12-07 (71 y.o. [name]) Dellie Catholic ?Primary Care Azlin Zilberman: [name] ?Other Clinician: ?Referring Itamar Mcgowan: ?Treating Jennetta Flood/Extender: Fredirick Maudlin ?[name] ?Weeks in Treatment: 0 ?Foot Assessment Items ?Site Locations ?+ = Sensation present, - = Sensation absent, C = Callus, U = Ulcer ?R = Redness, W = Warmth, M = Maceration, PU = Pre-ulcerative lesion ?F = Fissure, S = Swelling, D = Dryness ?Assessment ?Right: Left: ?Other Deformity: No No ?Prior Foot Ulcer: No No ?Prior Amputation: No No ?Charcot Joint: No No ?Ambulatory Status: Ambulatory Without Help ?Gait: Steady ?Electronic  Signature(s) ?Signed: 04/17/2022 3:35:21 PM By: Dellie Catholic RN ?Entered By: Dellie Catholic on 04/17/2022 08:32:31 ?-------------------------------------------------------------------------------- ?Nutrition Risk Screening Details ?Patient Name: ?Date of Service: ?Marcus Ileana Ladd M T. 04/17/2022 8:00 A M ?Medical Record Number: [id number] ?Patient Account Number: [id number] ?Date of Birth/Sex: ?Treating RN: ?1951-08-12 (71 y.o. [name]) Dellie Catholic ?Primary Care Zamyra Allensworth: [name] ?Other Clinician: ?Referring Kera Deacon: ?Treating Skilar Marcou/Extender: Fredirick Maudlin ?[name] ?Weeks in Treatment: 0 ?Height (in): 70 ?Weight (lbs): 219 ?Body Mass Index (BMI): 31.4 ?Nutrition Risk Screening Items ?Score Screening ?NUTRITION RISK SCREEN: ?I have an illness or condition that made me change the kind and/or amount of food I eat 0 No ?I eat fewer than two meals per day 0 No ?I eat few fruits and vegetables, or milk products 0 No ?I have three or more drinks of beer, liquor or wine almost every day 0 No ?I have tooth or mouth problems that make it hard for me to eat 0 No ?I don't always have enough money to buy the food I need 0 No ?I eat alone most of the time 0 No ?I take three or more different prescribed or over-the-counter drugs a day 0 No ?Without wanting to, I have lost or gained 10 pounds in the last six months 0 No ?I am not always physically able to shop, cook and/or feed myself 0 No ?Nutrition Protocols ?Good Risk Protocol 0 No interventions needed ?Moderate Risk Protocol ?High Risk Proctocol ?Risk Level: Good Risk ?Score: 0 ?Electronic Signature(s) ?Signed: 04/17/2022 3:35:21 PM By: Dellie Catholic RN ?Entered By: Dellie Catholic on 04/17/2022 08:29:35 ?

## 2022-04-17 NOTE — Progress Notes (Signed)
ARTURO, SOFRANKO ([id number]) ?Marland Kitchen ?Visit Report for 04/17/2022 ?Chief Complaint Document Details ?Patient Name: Date of Service: ?Marcus Apple M T. 04/17/2022 8:00 A M ?Medical Record Number: [id number] ?Patient Account Number: [id number] ?Date of Birth/Sex: Treating RN: ?24-Jan-1951 (71 y.o. M) ?Primary Care Provider: [name] Other Clinician: ?Referring Provider: ?Treating Provider/Extender: Fredirick Maudlin ?[name] ?Weeks in Treatment: 0 ?Information Obtained from: Patient ?Chief Complaint ?Patient seen for complaints of Non-Healing Wounds. ?Electronic Signature(s) ?Signed: 04/17/2022 8:59:54 AM By: Fredirick Maudlin MD FACS ?Entered By: Fredirick Maudlin on 04/17/2022 08:59:54 ?-------------------------------------------------------------------------------- ?Debridement Details ?Patient Name: Date of Service: ?Marcus Apple M T. 04/17/2022 8:00 A M ?Medical Record Number: [id number] ?Patient Account Number: [id number] ?Date of Birth/Sex: Treating RN: ?12-08-1951 (71 y.o. [name]) Dellie Catholic ?Primary Care Provider: [name] Other Clinician: ?Referring Provider: ?Treating Provider/Extender: Fredirick Maudlin ?[name] ?Weeks in Treatment: 0 ?Debridement Performed for Assessment: Wound #3 Left,Anterior Lower Leg ?Performed By: Physician Fredirick Maudlin, MD ?Debridement Type: Debridement ?Severity of Tissue Pre Debridement: Fat layer exposed ?Level of Consciousness (Pre-procedure): Awake and Alert ?Pre-procedure Verification/Time Out Yes - 08:51 ?Taken: ?Start Time: 08:51 ?Pain Control: ?Other : Benzocaine 20% ?T Area Debrided (L x W): ?otal 2.7 (cm) x 1 (cm) = 2.7 (cm?) ?Tissue and other material debrided: ?Non-Viable, Slough, Subcutaneous, Skin: Dermis , Skin: Epidermis, Slough ?Level: Skin/Subcutaneous Tissue ?Debridement Description: Excisional ?Instrument: Curette ?Bleeding: Minimum ?Hemostasis Achieved: Pressure ?End Time: 08:52 ?Procedural Pain: 0 ?Post Procedural Pain: 0 ?Response to  Treatment: Procedure was tolerated well ?Level of Consciousness (Post- Awake and Alert ?procedure): ?Post Debridement Measurements of Total Wound ?Length: (cm) 2.7 ?Width: (cm) 1 ?Depth: (cm) 0.1 ?Volume: (cm?) 0.212 ?Character of Wound/Ulcer Post Debridement: Improved ?Severity of Tissue Post Debridement: Fat layer exposed ?Post Procedure Diagnosis ?Same as Pre-procedure ?Electronic Signature(s) ?Signed: 04/17/2022 9:07:19 AM By: Fredirick Maudlin MD FACS ?Signed: 04/17/2022 3:35:21 PM By: Dellie Catholic RN ?Entered By: Dellie Catholic on 04/17/2022 08:55:25 ?-------------------------------------------------------------------------------- ?HPI Details ?Patient Name: Date of Service: ?Marcus Apple M T. 04/17/2022 8:00 A M ?Medical Record Number: [id number] ?Patient Account Number: [id number] ?Date of Birth/Sex: Treating RN: ?[birth year] (71 y.o. M) ?Primary Care Provider: [name] Other Clinician: ?Referring Provider: ?Treating Provider/Extender: Fredirick Maudlin ?[name] ?Weeks in Treatment: 0 ?History of Present Illness ?HPI Description: 11/10/2020 patient presents today for initial evaluation in this clinic although I have seen this patient in [place] previous. Subsequently his ?issue today is different from when I saw him for I was treating him for a toe ulcer. Currently he is having issues with bilateral lower extremity ulcers after ?running into a metal bar. Upon inspection today he has wounds of the bilateral lower extremities which are consistent with having struck his legs on the anterior ?aspect. With that being said the patient did go to the ER for evaluation on October 11 where he was given Keflex initially. He went back to the ER after not ?improving on October 20 he was given Lasix at that point he states the Lasix seem to help the most. Has been using Neosporin and leaving this open area ?which is probably not the best way to go either to be honest. He has had arterial studies on March  2021 which showed a left ABI of 0.78 with a TBI of 0.34 and ?a right ABI of 1.18 with a TBI 1.18. Nonetheless his arterial flow the left is not ideal but also I think potentially consistent with allowing this to heal. I think he ?can also support light  compression with Kerlix and Coban based on this result. His most recent hemoglobin A1c that we could find was 7.3 and that was towards ?the end of 2020. The patient does have a history of heart disease unfortunately. He is also currently still a smoker. ?11/24/2020 on evaluation today patient appears to be doing well with regard to his wounds. In fact both appear to be doing better after last week's rather last ?visit's debridement. Fortunately there is no signs of active infection at this time. No fevers, chills, nausea, vomiting, or diarrhea. Overall I am very pleased ?with where things stand today. ?12/01/2020 on evaluation today patient appears to be doing well with regard to his leg ulcers. He has been tolerating the dressing changes without complication. ?Fortunately there is no signs of active infection at this time. No fevers, chills, nausea, vomiting, or diarrhea. ?12/08/2020 on evaluation today patient appears to be doing very well in regard to his bilateral lower extremity ulcers. They seem to be doing excellent and he is ?making great progress. There does not appear to be any evidence of active infection at this time. No fevers, chills, nausea, vomiting, or diarrhea. ?12/15/2020 upon evaluation today patient actually appears to be doing excellent in regard to his wounds. Has been tolerating the dressing changes without ?complication. Fortunately his wounds are showing signs of great improvement were using Hydrofera Blue. ?12/29/2020 on evaluation today patient appears to be doing well with regard to his right leg which is completely healed. His left leg is also measuring smaller than ?not healed this is doing very well. Fortunately there is no signs of active  infection at this time ?01/05/2021 upon evaluation today patient actually appears to be making signs of improvement with regard to his left leg. I am very pleased with how things are ?progressing. There is no evidence of active infection at this time. No fevers, chills, nausea, vomiting, or diarrhea. Patient is extremely happy with the fact that ?this is doing so well ?01/12/2021 upon evaluation today patient appears to be doing excellent in regard to his leg ulcer. Fortunately there is no signs of active infection at this time. ?No fevers, chills, nausea, vomiting, or diarrhea. He has been tolerating the dressing changes without complication. ?01/19/2021 upon evaluation today patient appears to be doing well with regard to his wound. In fact this appears to be completely healed on the left leg. This is ?brand-new skin but overall seems to be doing quite well which is great news. No fevers, chills, nausea, vomiting, or diarrhea. ?READMISSION ?04/17/2022 ?This is a patient has been seen on a number of occasions in the wound care center. He is 71 years old and has type 2 diabetes mellitus, coronary artery ?disease, chronic venous insufficiency, and peripheral arterial disease with recent vascular studies demonstrating a right TBI of 0.9 (normal) and a left great toe ?TBI of 0.33. 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 He recently dropped an object and it hit him in the left shin. He then bumped the same ?leg against his wagon. He now has 2 wounds on his left anterior lower leg. He says that he applied Aquaphor to keep the areas moist and made an appointment ?in the wound care center for follow-up. ?Electronic Signature(s) ?Signed: 04/17/2022 9:02:52 AM By: Fredirick Maudlin MD FACS ?Entered By: Fredirick Maudlin on 04/17/2022 09:02:51 ?-------------------------------------------------------------------------------- ?Physical Exam Details ?Patient Name: Date of Service: ?Marcus Apple M T. 04/17/2022 8:00  A M ?Medical Record Number: [id number] ?Patient Account  Number: [id number] ?Date of Birth/Sex: Treating RN: ?February 23, 1951 (71 y.o. M) ?Primary Care Provider: [name] Other Clinician: ?Referring Provider:

## 2022-04-17 NOTE — Progress Notes (Signed)
Marcus, [name] ([id number]) ?Marland Kitchen ?Visit Report for 04/17/2022 ?Allergy List Details ?Patient Name: Date of Service: ?Marcus Apple M T. 04/17/2022 8:00 A M ?Medical Record Number: [id number] ?Patient Account Number: [id number] ?Date of Birth/Sex: Treating RN: ?[birth year] (71 y.o. [name]) Dellie Catholic ?Primary Care Margart Zemanek: [name] Other Clinician: ?Referring Kalsey Lull: ?Treating Justiss Gerbino/Extender: Fredirick Maudlin ?[name] ?Weeks in Treatment: 0 ?Allergies ?Active Allergies ?bee venom protein (honey bee) ?Severity: Moderate ?penicillin ?Reaction: free bleeding ?Severity: Moderate ?Allergy Notes ?Electronic Signature(s) ?Signed: 04/17/2022 3:35:21 PM By: Dellie Catholic RN ?Entered By: Dellie Catholic on 04/17/2022 08:25:02 ?-------------------------------------------------------------------------------- ?Arrival Information Details ?Patient Name: Date of Service: ?Marcus Apple M T. 04/17/2022 8:00 A M ?Medical Record Number: [id number] ?Patient Account Number: [id number] ?Date of Birth/Sex: Treating RN: ?1951-02-27 (71 y.o. [name]) Dellie Catholic ?Primary Care [name]: [name] Other Clinician: ?Referring Elveria Lauderbaugh: ?Treating Phil Corti/Extender: Fredirick Maudlin ?[name] ?Weeks in Treatment: 0 ?Visit Information ?Patient Arrived: Ambulatory ?Arrival Time: 08:22 ?Accompanied By: self ?Transfer Assistance: None ?Patient Identification Verified: Yes ?Secondary Verification Process Completed: Yes ?Patient Has Alerts: Yes ?Patient Alerts: Patient on Blood Thinner ?ABI R 1.11 ?ABI L 0.72 ?TBI L 0.33 ?History Since Last Visit ?Added or deleted any medications: No ?Any new allergies or adverse reactions: No ?Had a fall or experienced change in activities of daily living that may affect risk of falls: No ?Signs or symptoms of abuse/neglect since last visito No ?Hospitalized since last visit: No ?Implantable device outside of the clinic excluding cellular tissue based products placed in the center since last  visit: No ?Has Dressing in Place as Prescribed: Yes ?Notes ?Pt. on Eliquis ?Electronic Signature(s) ?Signed: 04/17/2022 3:35:21 PM By: Dellie Catholic RN ?Entered By: Dellie Catholic on 04/17/2022 08:38:30 ?-------------------------------------------------------------------------------- ?Clinic Level of Care Assessment Details ?Patient Name: Date of Service: ?Marcus Apple M T. 04/17/2022 8:00 A M ?Medical Record Number: [id number] ?Patient Account Number: [id number] ?Date of Birth/Sex: Treating RN: ?12-08-1951 (71 y.o. [name]) Dellie Catholic ?Primary Care [name]: [name] Other Clinician: ?Referring [name]: ?Treating Ronelle Michie/Extender: Fredirick Maudlin ?[name] ?Weeks in Treatment: 0 ?Clinic Level of Care Assessment Items ?TOOL 1 Quantity Score ?X- 1 0 ?Use when EandM and Procedure is performed on INITIAL visit ?ASSESSMENTS - Nursing Assessment / Reassessment ?X- 1 20 ?General Physical Exam (combine w/ comprehensive assessment (listed just below) when performed on new pt. evals) ?X- 1 25 ?Comprehensive Assessment (HX, ROS, Risk Assessments, Wounds Hx, etc.) ?ASSESSMENTS - Wound and Skin Assessment / Reassessment ?X- 1 10 ?Dermatologic / Skin Assessment (not related to wound area) ?ASSESSMENTS - Ostomy and/or Continence Assessment and Care ?'[]'$  - 0 ?Incontinence Assessment and Management ?'[]'$  - 0 ?Ostomy Care Assessment and Management (repouching, etc.) ?PROCESS - Coordination of Care ?'[]'$  - 0 ?Simple Patient / Family Education for ongoing care ?X- 1 20 ?Complex (extensive) Patient / Family Education for ongoing care ?X- 1 10 ?Staff obtains Consents, Records, T Results / Process Orders ?est ?X- 1 10 ?Staff telephones HHA, Nursing Homes / Clarify orders / etc ?'[]'$  - 0 ?Routine Transfer to another Facility (non-emergent condition) ?'[]'$  - 0 ?Routine Hospital Admission (non-emergent condition) ?X- 1 15 ?New Admissions / Biomedical engineer / Ordering NPWT Apligraf, etc. ?, ?'[]'$  - 0 ?Emergency Hospital  Admission (emergent condition) ?PROCESS - Special Needs ?'[]'$  - 0 ?Pediatric / Minor Patient Management ?'[]'$  - 0 ?Isolation Patient Management ?'[]'$  - 0 ?Hearing / Language / Visual special needs ?'[]'$  - 0 ?Assessment of Community assistance (transportation, D/C planning, etc.) ?'[]'$  - 0 ?Additional assistance /  Altered mentation ?'[]'$  - 0 ?Support Surface(s) Assessment (bed, cushion, seat, etc.) ?INTERVENTIONS - Miscellaneous ?'[]'$  - 0 ?External ear exam ?'[]'$  - 0 ?Patient Transfer (multiple staff / Civil Service fast streamer / Similar devices) ?'[]'$  - 0 ?Simple Staple / Suture removal (25 or less) ?'[]'$  - 0 ?Complex Staple / Suture removal (26 or more) ?'[]'$  - 0 ?Hypo/Hyperglycemic Management (do not check if billed separately) ?X- 1 15 ?Ankle / Brachial Index (ABI) - do not check if billed separately ?Has the patient been seen at the hospital within the last three years: Yes ?Total Score: 125 ?Level Of Care: New/Established - Level 4 ?Electronic Signature(s) ?Signed: 04/17/2022 3:35:21 PM By: Dellie Catholic RN ?Entered By: Dellie Catholic on 04/17/2022 09:24:29 ?-------------------------------------------------------------------------------- ?Encounter Discharge Information Details ?Patient Name: ?Date of Service: ?MO Ileana [name] M T. 04/17/2022 8:00 A M ?Medical Record Number: [id number] ?Patient Account Number: [id number] ?Date of Birth/Sex: ?Treating RN: ?06/09/1951 (71 y.o. [name]) Dellie Catholic ?Primary Care Ligaya Cormier: [name] ?Other Clinician: ?Referring Ziare Cryder: ?Treating Navjot Pilgrim/Extender: Fredirick Maudlin ?[name] ?Weeks in Treatment: 0 ?Encounter Discharge Information Items Post Procedure Vitals ?Discharge Condition: Stable ?Temperature (F): 97.6 ?Ambulatory Status: Kasandra Knudsen ?Pulse (bpm): 66 ?Discharge Destination: Home ?Respiratory Rate (breaths/min): 18 ?Transportation: Private Auto ?Blood Pressure (mmHg): 126/79 ?Accompanied By: self ?Schedule Follow-up Appointment: Yes ?Clinical Summary of Care: Patient Declined ?Electronic  Signature(s) ?Signed: 04/17/2022 3:35:21 PM By: Dellie Catholic RN ?Entered By: Dellie Catholic on 04/17/2022 09:26:13 ?-------------------------------------------------------------------------------- ?Lower Extremity Assessment Details ?Patient Name: ?Date of Service: ?MO Ileana [name] M T. 04/17/2022 8:00 A M ?Medical Record Number: [id number] ?Patient Account Number: [id number] ?Date of Birth/Sex: ?Treating RN: ?1951/03/20 (71 y.o. [name]) Dellie Catholic ?Primary Care Gracianna Vink: [name] ?Other Clinician: ?Referring Lynnex Fulp: ?Treating Chevez Sambrano/Extender: Fredirick Maudlin ?[name] ?Weeks in Treatment: 0 ?Edema Assessment ?Assessed: [Left: No] [Right: No] ?E[Left: dema] [Right: :] ?Calf ?Left: Right: ?Point of Measurement: 34 cm From Medial Instep 38.8 cm ?Ankle ?Left: Right: ?Point of Measurement: 12 cm From Medial Instep 26.9 cm ?Knee To Floor ?Left: Right: ?From Medial Instep 47 cm ?Vascular Assessment ?Pulses: ?Dorsalis Pedis ?Palpable: [Left:Yes] ?Electronic Signature(s) ?Signed: 04/17/2022 3:35:21 PM By: Dellie Catholic RN ?Entered By: Dellie Catholic on 04/17/2022 08:34:14 ?-------------------------------------------------------------------------------- ?Multi Wound Chart Details ?Patient Name: ?Date of Service: ?MO Ileana [name] M T. 04/17/2022 8:00 A M ?Medical Record Number: [id number] ?Patient Account Number: [id number] ?Date of Birth/Sex: ?Treating RN: ?[birth year] (70 y.o. M) ?Primary Care Pariss Hommes: [name] ?Other Clinician: ?Referring Aniah Pauli: ?Treating Arely Tinner/Extender: Fredirick Maudlin ?[name] ?Weeks in Treatment: 0 ?Vital Signs ?Height(in): 70 ?Pulse(bpm): 66 ?Weight(lbs): 219 ?Blood Pressure(mmHg): 126/79 ?Body Mass Index(BMI): 31.4 ?Temperature(??F): 97.6 ?Respiratory Rate(breaths/min): 18 ?Photos: [N/A:N/A] ?Left, Anterior Lower Leg Left, Lateral Lower Leg N/A ?Wound Location: ?Trauma Trauma N/A ?Wounding Event: ?Diabetic Wound/Ulcer of the Lower Diabetic Wound/Ulcer of the Lower  N/A ?Primary Etiology: ?Extremity Extremity ?Cataracts, Arrhythmia, Coronary Cataracts, Arrhythmia, Coronary N/A ?Comorbid History: ?Artery Disease, Hypertension, Artery Disease, Hypertension, ?Myocardial Infar

## 2022-04-21 ENCOUNTER — Ambulatory Visit: Payer: PPO | Admitting: Cardiovascular Disease

## 2022-04-21 ENCOUNTER — Encounter: Payer: Self-pay | Admitting: Cardiovascular Disease

## 2022-04-21 VITALS — BP 96/62 | HR 94 | Ht 70.0 in | Wt 225.6 lb

## 2022-04-21 DIAGNOSIS — E785 Hyperlipidemia, unspecified: Secondary | ICD-10-CM | POA: Diagnosis not present

## 2022-04-21 DIAGNOSIS — I739 Peripheral vascular disease, unspecified: Secondary | ICD-10-CM

## 2022-04-21 DIAGNOSIS — E669 Obesity, unspecified: Secondary | ICD-10-CM | POA: Diagnosis not present

## 2022-04-21 DIAGNOSIS — Z720 Tobacco use: Secondary | ICD-10-CM

## 2022-04-21 DIAGNOSIS — D6869 Other thrombophilia: Secondary | ICD-10-CM

## 2022-04-21 DIAGNOSIS — I10 Essential (primary) hypertension: Secondary | ICD-10-CM | POA: Diagnosis not present

## 2022-04-21 DIAGNOSIS — I2510 Atherosclerotic heart disease of native coronary artery without angina pectoris: Secondary | ICD-10-CM

## 2022-04-21 DIAGNOSIS — I872 Venous insufficiency (chronic) (peripheral): Secondary | ICD-10-CM | POA: Diagnosis not present

## 2022-04-21 DIAGNOSIS — I480 Paroxysmal atrial fibrillation: Secondary | ICD-10-CM

## 2022-04-21 MED ORDER — APIXABAN 5 MG PO TABS: 5.0000 mg | ORAL_TABLET | Freq: Two times a day (BID) | ORAL | 3 refills | Status: DC

## 2022-04-21 NOTE — Progress Notes (Signed)
? ?Cardiology Office Note  ? ?Date:  04/23/2022  ? ?ID:  [name], DOB [birth year], MRN [id number] ? ?PCP:  [name], PA-C  ?Cardiologist:  Sanda Klein, MD  ?Electrophysiologist:  None  ? ?Evaluation Performed:  Follow-Up Visit ? ?Chief Complaint:  CAD, AFib ? ?History of Present Illness:   ? ?[name] is a 71 y.o. male with coronary artery disease, CABG ?4 in 2009, prior inferior wall infarction, hypertension, hyperlipidemia, type 2 diabetes mellitus, venous insufficiency of the lower extremities, previous right frontal cortical infarct, PAD of lower extremities, asymptomatic atrial fibrillation detected by implantable loop recorder. ? ?His mother passed away in hospice care about a year ago.  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  He stays busy all the time.  He denies angina or dyspnea at rest or with activity.  He denies new focal neurological events and has not had falls, injuries, bleeding, orthopnea, PND, palpitations or syncope.  He has occasional pain in the the right calf although this occurs with exertion and occasionally at rest as well.  He wears compression stockings for peripheral venous insufficiency.  They do a reasonably good job of controlling his chronic edema.  Has paresthesias typical of diabetic neuropathy in both feet. ? ?Unfortunately he is still smoking, albeit less than a pack a day. ? ?Before running out of battery, his loop recorder which showed infrequent episodes of paroxysmal atrial fibrillation (prevalence under 1%).  He has significant arterial disease in the left leg, ABI 0.77, occlusion of SFA, brief reconstitution and occlusion of mid popliteal and tibioperoneal trunk and peroneal, posterior tibial mid-calf reconstitution (duplex US 02/2019, unchanged in 02/2020). ? ?He had a vascular screen performed in August 2019 that did not show any evidence of focal stenoses in the aorta or iliac arteries,  mild abdominal aortic ectasia with a maximum diameter of 2.4 cm.  He did not have any meaningful carotid stenoses by ultrasound in 2017. ? ?Glycemic control has deteriorated with a hemoglobin A1c up to 9.3% a few weeks ago.  LDL is excellent at 31 but HDL is chronically low at 28.  Triglycerides are normal.  He has normal renal function and normal thyroid function.  He has gained weight and his BMI is up to 32. ? ?Past Medical History:  ?Diagnosis Date  ? Abdominal aortic aneurysm (AAA) ([name])   ? 3 cm per 07-02-18 US abdominal aorta US epic  ? Arthritis   ? Blood clot in vein   ? behind left knee  ? Chronic kidney disease   ? renal calculi  ? Chronic venous insufficiency LOWER EXTREMITIES  ? Coronary artery disease CARDIOLOGIST - DR  [id number]-  LAST 1 WK AGO -- WILL REQUEST NOTE AND STRESS TEST  ? Diabetes mellitus without complication ([place])   ? type 2  ? Hematuria   ? last year  ? Hyperglycemia   ? Hyperlipidemia   ? Hypertension   ? Mixed dyslipidemia   ? Myocardial infarction [facility])   ? Neuropathy   ? toes only  ? Nocturia   ? Prostate cancer ([name]) 08/18/11  ? gleason 8, volume 24.4cc  ? S/P CABG x 4   ? ST elevation MI (STEMI) ([name]) 02-10-2008  ? S/P CABG  ? Stroke [facility]) 2016  ? occ trouble wriing with right hand  ? Urinary hesitancy   ? Vision abnormalities   ? resolved now  ? Wound  discharge   ? since jan 2019 goes to wound center [place] left great toe small open area changes dressing q 2 days with ointment provided by wound center, clear drainage occ  ? ?Past Surgical History:  ?Procedure Laterality Date  ? CARDIAC CATHETERIZATION  05/01/2012  ? grafts widely patent  ? CARDIOVASCULAR STRESS TEST  03-15-2010  ? INFERIOR WALL SCAR WITHOUT ANY MEANINGFUL ISCHEMIA/ EF 46% / LOW RISK SCAN  ? CATARACT EXTRACTION    ? CORONARY ARTERY BYPASS GRAFT  02-10-2008  DR [name]  ? X4 VESSEL DISEASE / EMERGANT CABG  ? CYSTOSCOPY  02/08/2012  ? Procedure: CYSTOSCOPY FLEXIBLE;  Surgeon: Marcus Gallo, MD;  Location: [facility];  Service: Urology;;  ? [place], URETEROSCOPY AND STENT PLACEMENT Right 09/12/2018  ? Procedure: CYSTOSCOPY WITH RETROGRADE PYELOGRAM, URETEROSCOPY AND STENT PLACEMENT, STONE EXTRACTION;  Surgeon: Marcus Gallo, MD;  Location: [facility];  Service: Urology;  Laterality: Right;  ? EP IMPLANTABLE DEVICE N/A 08/14/2016  ? Procedure: Loop Recorder Insertion;  Surgeon: Evans Lance, MD;  Location: [place] CV LAB;  Service: Cardiovascular;  Laterality: N/A;  ? EXTRACORPOREAL SHOCK WAVE LITHOTRIPSY  2013  ? HOLMIUM LASER APPLICATION Right 4/31/5400  ? Procedure: HOLMIUM LASER APPLICATION;  Surgeon: Marcus Gallo, MD;  Location: [facility];  Service: Urology;  Laterality: Right;  ? KNEE SURGERY  age 62  ? RIGHT  ? LEFT HEART CATHETERIZATION WITH CORONARY/GRAFT ANGIOGRAM N/A 05/01/2012  ? Procedure: LEFT HEART CATHETERIZATION WITH [name];  Surgeon: Sanda Klein, MD;  Location: [facility] CATH LAB;  Service: Cardiovascular;  Laterality: N/A;  ? MULTIPLE TEETH EXTRACTIONS (23)/ FOUR QUADRANT ALVEOLPLASTY/ MANDIBULAR LATERAL EXOSTOSES REDUCTIONS  03-24-2010  ? CHRONIC PERIODONTITIS  ? RADIOACTIVE SEED IMPLANT  02/08/2012  ? Procedure: RADIOACTIVE SEED IMPLANT;  Surgeon: Marcus Gallo, MD;  Location: [facility];  Service: Urology;  Laterality: N/A;  C-ARM ?  ? [place]  ? LEFT  ? TEE WITHOUT CARDIOVERSION N/A 08/14/2016  ? Procedure: TRANSESOPHAGEAL ECHOCARDIOGRAM (TEE);  Surgeon: Marcus Hector, MD;  Location: [place];  Service: Cardiovascular;  Laterality: N/A;  ? URETERAL STENT PLACEMENT    ?  ? ?Current Meds  ?Medication Sig  ? atorvastatin (LIPITOR) 10 MG tablet Take 1 tablet by mouth once daily  ? blood glucose meter kit and supplies KIT Dispense based on patient and insurance preference.  ? Continuous Blood Gluc Receiver (FREESTYLE LIBRE 14 DAY READER) DEVI 1 Device by Does not apply route daily. Use to check blood sugars  every morning and 2 hours after largest meals  ? Continuous Blood Gluc Sensor (FREESTYLE LIBRE 14 DAY SENSOR) MISC USE TO CHECK BLOOD SUGARS IN THE MORNING FASTING AND  2  HOURS  AFTER  LARGEST  MEAL  ? finasteride (PROSCAR) 5 MG tablet Take 5 mg by mouth daily.  ? gabapentin (NEURONTIN) 300 MG capsule TAKE 2 CAPSULES BY MOUTH IN THE MORNING AND 2 IN THE EVENING AT  DINNERTIME  ? glucose blood (FREESTYLE LITE) test strip Use to check blood sugars every morning fasting  ? lisinopril (ZESTRIL) 5 MG tablet Take 1 tablet by mouth once daily  ? mirabegron ER (MYRBETRIQ) 50 MG TB24 tablet Take 1 tablet (50 mg total) by mouth daily.  ? Multiple Vitamin (MULTIVITAMIN WITH MINERALS) TABS tablet Take 1 tablet by mouth daily. Men's One-A-Day Multivitamin  ? oxybutynin (DITROPAN-XL) 5 MG 24 hr tablet Take 5 mg by mouth at bedtime.  ? tamsulosin (FLOMAX)  0.4 MG CAPS capsule Take 1 capsule by mouth once daily at bedtime  ? WELLBUTRIN SR 150 MG 12 hr tablet Take 1 tablet (150 mg total) by mouth 2 (two) times daily.  ? [DISCONTINUED] apixaban (ELIQUIS) 5 MG TABS tablet Take 1 tablet (5 mg total) by mouth 2 (two) times daily.  ?  ? ?Allergies:   Bee venom and Penicillins  ? ?Social History  ? ?Tobacco Use  ? Smoking status: Every Day  ?  Packs/day: 1.00  ?  Years: 50.00  ?  Pack years: 50.00  ?  Types: Cigarettes  ? Smokeless tobacco: Never  ? Tobacco comments:  ?  PREVIOUSLY SMOKED 3PPD / DECREASED TO 1PPD SINCE 2009  ?Vaping Use  ? Vaping Use: Never used  ?Substance Use Topics  ? Alcohol use: Not Currently  ?  Alcohol/week: 2.0 standard drinks  ?  Types: 1 Cans of beer, 1 Shots of liquor per week  ?  Comment: none in 3 years  ? Drug use: No  ?  ? ?Family Hx: ?The patient's family history includes Cancer in his father; Diabetes in his father. ? ?ROS:   ?Please see the history of present illness.    ? ?All other systems reviewed and are negative. ? ? ?Prior CV studies:   ?The following studies were reviewed today: ?ABI 03/28/2022 ?      Today ABIToday TBIPrevious ABIPrevious TBI  ?+-------+-----------+-----------+------------+------------+  ?Right  1.11       0.90       1.13        0.97          ?+-------+-----------+-----------+-----

## 2022-04-21 NOTE — Patient Instructions (Signed)

## 2022-04-23 ENCOUNTER — Encounter: Payer: Self-pay | Admitting: Cardiovascular Disease

## 2022-04-24 ENCOUNTER — Other Ambulatory Visit: Payer: Self-pay | Admitting: Physician Assistant

## 2022-04-24 ENCOUNTER — Ambulatory Visit: Payer: PPO

## 2022-04-24 ENCOUNTER — Encounter (HOSPITAL_BASED_OUTPATIENT_CLINIC_OR_DEPARTMENT_OTHER): Payer: PPO | Attending: General Surgery | Admitting: General Surgery

## 2022-04-24 ENCOUNTER — Ambulatory Visit
Admission: RE | Admit: 2022-04-24 | Discharge: 2022-04-24 | Disposition: A | Discharge status: Home / Self Care | Payer: PPO | Source: Ambulatory Visit | Attending: Physician Assistant | Admitting: Physician Assistant

## 2022-04-24 DIAGNOSIS — R911 Solitary pulmonary nodule: Secondary | ICD-10-CM

## 2022-04-24 DIAGNOSIS — M722 Plantar fascial fibromatosis: Secondary | ICD-10-CM

## 2022-04-24 DIAGNOSIS — B9561 Methicillin susceptible Staphylococcus aureus infection as the cause of diseases classified elsewhere: Secondary | ICD-10-CM | POA: Diagnosis not present

## 2022-04-24 DIAGNOSIS — I10 Essential (primary) hypertension: Secondary | ICD-10-CM | POA: Diagnosis not present

## 2022-04-24 DIAGNOSIS — E1151 Type 2 diabetes mellitus with diabetic peripheral angiopathy without gangrene: Secondary | ICD-10-CM | POA: Insufficient documentation

## 2022-04-24 DIAGNOSIS — E11622 Type 2 diabetes mellitus with other skin ulcer: Secondary | ICD-10-CM | POA: Diagnosis not present

## 2022-04-24 DIAGNOSIS — F17210 Nicotine dependence, cigarettes, uncomplicated: Secondary | ICD-10-CM | POA: Diagnosis not present

## 2022-04-24 DIAGNOSIS — I872 Venous insufficiency (chronic) (peripheral): Secondary | ICD-10-CM | POA: Diagnosis not present

## 2022-04-24 DIAGNOSIS — Z1611 Resistance to penicillins: Secondary | ICD-10-CM | POA: Diagnosis not present

## 2022-04-24 DIAGNOSIS — I7389 Other specified peripheral vascular diseases: Secondary | ICD-10-CM | POA: Diagnosis not present

## 2022-04-24 DIAGNOSIS — E1169 Type 2 diabetes mellitus with other specified complication: Secondary | ICD-10-CM

## 2022-04-24 DIAGNOSIS — L97822 Non-pressure chronic ulcer of other part of left lower leg with fat layer exposed: Secondary | ICD-10-CM | POA: Diagnosis not present

## 2022-04-24 DIAGNOSIS — F17200 Nicotine dependence, unspecified, uncomplicated: Secondary | ICD-10-CM

## 2022-04-24 NOTE — Progress Notes (Addendum)
KAIN, MILOSEVIC ([id number]) ?Marland Kitchen ?Visit Report for 04/24/2022 ?Chief Complaint Document Details ?Patient Name: Date of Service: ?Marcus Apple M T. 04/24/2022 9:30 A M ?Medical Record Number: [id number] ?Patient Account Number: [id number] ?Date of Birth/Sex: Treating RN: ?1951-12-01 (71 y.o. [name]) Dellie Catholic ?Primary Care Provider: [name] Other Clinician: ?Referring Provider: ?Treating Provider/Extender: Fredirick Maudlin ?[name] ?Weeks in Treatment: 1 ?Information Obtained from: Patient ?Chief Complaint ?Patient seen for complaints of Non-Healing Wounds. ?Electronic Signature(s) ?Signed: 04/24/2022 9:40:20 AM By: Fredirick Maudlin MD FACS ?Entered By: Fredirick Maudlin on 04/24/2022 09:40:20 ?-------------------------------------------------------------------------------- ?Debridement Details ?Patient Name: Date of Service: ?Marcus Apple M T. 04/24/2022 9:30 A M ?Medical Record Number: [id number] ?Patient Account Number: [id number] ?Date of Birth/Sex: Treating RN: ?[birth year] (71 y.o. [name]) Dellie Catholic ?Primary Care Provider: [name] Other Clinician: ?Referring Provider: ?Treating Provider/Extender: Fredirick Maudlin ?[name] ?Weeks in Treatment: 1 ?Debridement Performed for Assessment: Wound #3 Left,Anterior Lower Leg ?Performed By: Physician Fredirick Maudlin, MD ?Debridement Type: Debridement ?Severity of Tissue Pre Debridement: Fat layer exposed ?Level of Consciousness (Pre-procedure): Awake and Alert ?Pre-procedure Verification/Time Out Yes - 09:35 ?Taken: ?Start Time: 09:35 ?Pain Control: ?Other : Benzocaine 20% ?T Area Debrided (L x W): ?otal 3.8 (cm) x 2.5 (cm) = 9.5 (cm?) ?Tissue and other material debrided: ?Non-Viable, Slough, Subcutaneous, Skin: Dermis , Slough ?Level: Skin/Subcutaneous Tissue ?Debridement Description: Excisional ?Instrument: Curette ?Bleeding: Minimum ?Hemostasis Achieved: Pressure ?End Time: 09:37 ?Procedural Pain: 0 ?Post Procedural Pain: 0 ?Response to  Treatment: Procedure was tolerated well ?Level of Consciousness (Post- Awake and Alert ?procedure): ?Post Debridement Measurements of Total Wound ?Length: (cm) 3.8 ?Width: (cm) 2.5 ?Depth: (cm) 0.1 ?Volume: (cm?) 0.746 ?Character of Wound/Ulcer Post Debridement: Improved ?Severity of Tissue Post Debridement: Fat layer exposed ?Post Procedure Diagnosis ?Same as Pre-procedure ?Electronic Signature(s) ?Signed: 04/24/2022 12:09:08 PM By: Fredirick Maudlin MD FACS ?Signed: 04/24/2022 5:35:09 PM By: Dellie Catholic RN ?Entered By: Dellie Catholic on 04/24/2022 09:42:10 ?-------------------------------------------------------------------------------- ?Debridement Details ?Patient Name: ?Date of Service: ?[name] M T. 04/24/2022 9:30 A M ?Medical Record Number: [id number] ?Patient Account Number: [id number] ?Date of Birth/Sex: ?Treating RN: ?Aug 15, 1951 (71 y.o. [name]) Dellie Catholic ?Primary Care Provider: [name] ?Other Clinician: ?Referring Provider: ?Treating Provider/Extender: Fredirick Maudlin ?[name] ?Weeks in Treatment: 1 ?Debridement Performed for Assessment: Wound #4 Left,Lateral Lower Leg ?Performed By: Physician Fredirick Maudlin, MD ?Debridement Type: Debridement ?Severity of Tissue Pre Debridement: Fat layer exposed ?Level of Consciousness (Pre-procedure): Awake and Alert ?Pre-procedure Verification/Time Out Yes - 09:35 ?Taken: ?Start Time: 09:35 ?Pain Control: ?Other : Benzocaine 20% ?T Area Debrided (L x W): ?otal 1.7 (cm) x 1.8 (cm) = 3.06 (cm?) ?Tissue and other material debrided: ?Non-Viable, Slough, Subcutaneous, Skin: Dermis , Slough ?Level: Skin/Subcutaneous Tissue ?Debridement Description: Excisional ?Instrument: Curette ?Bleeding: Minimum ?Hemostasis Achieved: Pressure ?End Time: 09:37 ?Procedural Pain: 0 ?Post Procedural Pain: 0 ?Response to Treatment: Procedure was tolerated well ?Level of Consciousness (Post- Awake and Alert ?procedure): ?Post Debridement Measurements of Total  Wound ?Length: (cm) 1.7 ?Width: (cm) 1.8 ?Depth: (cm) 0.1 ?Volume: (cm?) 0.24 ?Character of Wound/Ulcer Post Debridement: Improved ?Severity of Tissue Post Debridement: Fat layer exposed ?Post Procedure Diagnosis ?Same as Pre-procedure ?Electronic Signature(s) ?Signed: 04/24/2022 12:09:08 PM By: Fredirick Maudlin MD FACS ?Signed: 04/24/2022 5:35:09 PM By: Dellie Catholic RN ?Entered By: Dellie Catholic on 04/24/2022 09:43:11 ?-------------------------------------------------------------------------------- ?HPI Details ?Patient Name: Date of Service: ?Marcus Apple M T. 04/24/2022 9:30 A M ?Medical Record Number: [id number] ?Patient Account Number: [id number] ?Date of Birth/Sex: Treating RN: ?03/12/1951 (71  y.o. [name]) Dellie Catholic ?Primary Care Provider: [name] Other Clinician: ?Referring Provider: ?Treating Provider/Extender: Fredirick Maudlin ?[name] ?Weeks in Treatment: 1 ?History of Present Illness ?HPI Description: 11/10/2020 patient presents today for initial evaluation in this clinic although I have seen this patient in [place] previous. Subsequently his ?issue today is different from when I saw him for I was treating him for a toe ulcer. Currently he is having issues with bilateral lower extremity ulcers after ?running into a metal bar. Upon inspection today he has wounds of the bilateral lower extremities which are consistent with having struck his legs on the anterior ?aspect. With that being said the patient did go to the ER for evaluation on October 11 where he was given Keflex initially. He went back to the ER after not ?improving on October 20 he was given Lasix at that point he states the Lasix seem to help the most. Has been using Neosporin and leaving this open area ?which is probably not the best way to go either to be honest. He has had arterial studies on March 2021 which showed a left ABI of 0.78 with a TBI of 0.34 and ?a right ABI of 1.18 with a TBI 1.18. Nonetheless his arterial  flow the left is not ideal but also I think potentially consistent with allowing this to heal. I think he ?can also support light compression with Kerlix and Coban based on this result. His most recent hemoglobin A1c that we could find was 7.3 and that was towards ?the end of 2020. The patient does have a history of heart disease unfortunately. He is also currently still a smoker. ?11/24/2020 on evaluation today patient appears to be doing well with regard to his wounds. In fact both appear to be doing better after last week's rather last ?visit's debridement. Fortunately there is no signs of active infection at this time. No fevers, chills, nausea, vomiting, or diarrhea. Overall I am very pleased ?with where things stand today. ?12/01/2020 on evaluation today patient appears to be doing well with regard to his leg ulcers. He has been tolerating the dressing changes without complication. ?Fortunately there is no signs of active infection at this time. No fevers, chills, nausea, vomiting, or diarrhea. ?12/08/2020 on evaluation today patient appears to be doing very well in regard to his bilateral lower extremity ulcers. They seem to be doing excellent and he is ?making great progress. There does not appear to be any evidence of active infection at this time. No fevers, chills, nausea, vomiting, or diarrhea. ?12/15/2020 upon evaluation today patient actually appears to be doing excellent in regard to his wounds. Has been tolerating the dressing changes without ?complication. Fortunately his wounds are showing signs of great improvement were using Hydrofera Blue. ?12/29/2020 on evaluation today patient appears to be doing well with regard to his right leg which is completely healed. His left leg is also measuring smaller than ?not healed this is doing very well. Fortunately there is no signs of active infection at this time ?01/05/2021 upon evaluation today patient actually appears to be making signs of improvement with  regard to his left leg. I am very pleased with how things are ?progressing. There is no evidence of active infection at this time. No fevers, chills, nausea, vomiting, or diarrhea. Patient is extremely happy with the fact tha

## 2022-04-24 NOTE — Progress Notes (Signed)
SITUATION: ?Reason for Visit: Fitting and Delivery of Custom Fabricated Foot Orthoses ?Patient Report: Patient reports comfort and is satisfied with device. ? ?OBJECTIVE DATA: ?Patient History / Diagnosis:   ?  ICD-10-CM   ?1. Diabetes mellitus type 2 in obese ([place])  E11.69   ? E66.9   ?  ?2. Plantar fasciitis  M72.2   ?  ? ? ?Provided Device:  Custom Functional Foot Orthotics ?    [facility]: [id number] ? ?GOAL OF ORTHOSIS ?- Improve gait ?- Decrease energy expenditure ?- Improve Balance ?- Provide Triplanar stability of foot complex ?- Facilitate motion ? ?ACTIONS PERFORMED ?Patient was fit with foot orthotics trimmed to shoe last. Patient tolerated fittign procedure.  ? ?Patient was provided with verbal and written instruction and demonstration regarding donning, doffing, wear, care, proper fit, function, purpose, cleaning, and use of the orthosis and in all related precautions and risks and benefits regarding the orthosis. ? ?Patient was also provided with verbal instruction regarding how to report any failures or malfunctions of the orthosis and necessary follow up care. Patient was also instructed to contact our office regarding any change in status that may affect the function of the orthosis. ? ?Patient demonstrated independence with proper donning, doffing, and fit and verbalized understanding of all instructions. ? ?PLAN: ?Patient is to follow up in one week or as necessary (PRN). All questions were answered and concerns addressed. Plan of care was discussed with and agreed upon by the patient. ? ?

## 2022-05-01 ENCOUNTER — Encounter (HOSPITAL_BASED_OUTPATIENT_CLINIC_OR_DEPARTMENT_OTHER): Payer: PPO | Admitting: General Surgery

## 2022-05-01 ENCOUNTER — Other Ambulatory Visit: Payer: Self-pay | Admitting: Physician Assistant

## 2022-05-01 DIAGNOSIS — L97822 Non-pressure chronic ulcer of other part of left lower leg with fat layer exposed: Secondary | ICD-10-CM | POA: Diagnosis not present

## 2022-05-01 DIAGNOSIS — E1159 Type 2 diabetes mellitus with other circulatory complications: Secondary | ICD-10-CM

## 2022-05-01 DIAGNOSIS — I10 Essential (primary) hypertension: Secondary | ICD-10-CM | POA: Diagnosis not present

## 2022-05-01 DIAGNOSIS — E1129 Type 2 diabetes mellitus with other diabetic kidney complication: Secondary | ICD-10-CM

## 2022-05-01 DIAGNOSIS — E11622 Type 2 diabetes mellitus with other skin ulcer: Secondary | ICD-10-CM | POA: Diagnosis not present

## 2022-05-01 DIAGNOSIS — I7389 Other specified peripheral vascular diseases: Secondary | ICD-10-CM | POA: Diagnosis not present

## 2022-05-01 NOTE — Progress Notes (Signed)
[name], [name] ([id number]) ?Marland Kitchen ?Visit Report for 05/01/2022 ?Arrival Information Details ?Patient Name: Date of Service: ?[name]. 05/01/2022 10:15 A M ?Medical Record Number: [id number] ?Patient Account Number: [id number] ?Date of Birth/Sex: Treating RN: ?02/19/1951 (71 y.o. [name]) Dellie Catholic ?Primary Care Quinn Quam: [name] Other Clinician: ?Referring Damiya Sandefur: ?Treating Gaylene Moylan/Extender: Fredirick Maudlin ?[name] ?Weeks in Treatment: 2 ?Visit Information History Since Last Visit ?Added or deleted any medications: No ?Patient Arrived: Ambulatory ?Any new allergies or adverse reactions: No ?Arrival Time: 10:35 ?Had a fall or experienced change in No ?Accompanied By: self ?activities of daily living that may affect ?Transfer Assistance: None ?risk of falls: ?Patient Identification Verified: Yes ?Signs or symptoms of abuse/neglect since last visito No ?Secondary Verification Process Completed: Yes ?Hospitalized since last visit: No ?Patient Requires Transmission-Based Precautions: No ?Implantable device outside of the clinic excluding No ?Patient Has Alerts: Yes ?cellular tissue based products placed in the center ?Patient Alerts: Patient on Blood Thinner since last visit: ?ABI R 1.11 Has Dressing in Place as Prescribed: Yes ?ABI L 0.72 ?Pain Present Now: Yes ?TBI L 0.33 ?Electronic Signature(s) ?Signed: 05/01/2022 1:54:41 PM By: Sandre Kitty ?Entered By: Sandre Kitty on 05/01/2022 10:35:25 ?-------------------------------------------------------------------------------- ?Encounter Discharge Information Details ?Patient Name: Date of Service: ?[name]. 05/01/2022 10:15 A M ?Medical Record Number: [id number] ?Patient Account Number: [id number] ?Date of Birth/Sex: Treating RN: ?1951-10-14 (71 y.o. [name]) Dellie Catholic ?Primary Care Nycere Presley: [name] Other Clinician: ?Referring Aashritha Miedema: ?Treating Jaquarius Seder/Extender: Fredirick Maudlin ?[name] ?Weeks in Treatment:  2 ?Encounter Discharge Information Items Post Procedure Vitals ?Discharge Condition: Stable ?Temperature (F): 98 ?Ambulatory Status: Ambulatory ?Pulse (bpm): 69 ?Discharge Destination: Home ?Respiratory Rate (breaths/min): 18 ?Transportation: Private Auto ?Blood Pressure (mmHg): 110/70 ?Accompanied By: self ?Schedule Follow-up Appointment: Yes ?Clinical Summary of Care: Patient Declined ?Electronic Signature(s) ?Signed: 05/01/2022 5:11:51 PM By: Dellie Catholic RN ?Entered By: Dellie Catholic on 05/01/2022 17:11:27 ?-------------------------------------------------------------------------------- ?Lower Extremity Assessment Details ?Patient Name: ?Date of Service: ?[name]. 05/01/2022 10:15 A M ?Medical Record Number: [id number] ?Patient Account Number: [id number] ?Date of Birth/Sex: ?Treating RN: ?02-26-1951 (71 y.o. [name]) Dellie Catholic ?Primary Care Tiger Spieker: [name] ?Other Clinician: ?Referring Sophiamarie Nease: ?Treating Chaney Ingram/Extender: Fredirick Maudlin ?[name] ?Weeks in Treatment: 2 ?Edema Assessment ?Assessed: [Left: No] [Right: No] ?E[Left: dema] [Right: :] ?Calf ?Left: Right: ?Point of Measurement: 34 cm From Medial Instep 37.5 cm ?Ankle ?Left: Right: ?Point of Measurement: 12 cm From Medial Instep 25.4 cm ?Electronic Signature(s) ?Signed: 05/01/2022 5:11:51 PM By: Dellie Catholic RN ?Entered By: Dellie Catholic on 05/01/2022 10:46:40 ?-------------------------------------------------------------------------------- ?Multi Wound Chart Details ?Patient Name: ?Date of Service: ?[name]. 05/01/2022 10:15 A M ?Medical Record Number: [id number] ?Patient Account Number: [id number] ?Date of Birth/Sex: ?Treating RN: ?03/08/1951 (71 y.o. [name]) Dellie Catholic ?Primary Care Caleb Decock: [name] ?Other Clinician: ?Referring Tharon Kitch: ?Treating Shalice Woodring/Extender: Fredirick Maudlin ?[name] ?Weeks in Treatment: 2 ?Vital Signs ?Height(in): 70 ?Pulse(bpm): 69 ?Weight(lbs): 219 ?Blood  Pressure(mmHg): 110/70 ?Body Mass Index(BMI): 31.4 ?Temperature(??F): 98.0 ?Respiratory Rate(breaths/min): 18 ?Photos: [N/A:N/A] ?Left, Anterior Lower Leg Left, Lateral Lower Leg N/A ?Wound Location: ?Trauma Trauma N/A ?Wounding Event: ?Diabetic Wound/Ulcer of the Lower Diabetic Wound/Ulcer of the Lower N/A ?Primary Etiology: ?Extremity Extremity ?Cataracts, Arrhythmia, Coronary Cataracts, Arrhythmia, Coronary N/A ?Comorbid History: ?Artery Disease, Hypertension, Artery Disease, Hypertension, ?Myocardial Infarction, Peripheral Myocardial Infarction, Peripheral ?Arterial Disease, Peripheral Venous Arterial Disease, Peripheral Venous ?Disease, Type II Diabetes, Disease, Type II Diabetes, ?Osteoarthritis, Neuropathy, Received Osteoarthritis, Neuropathy, Received ?Radiation Radiation ?04/10/2022 04/10/2022  N/A ?Date A cquired: ?2 2 N/A ?Weeks of Treatment: ?Open Open N/A ?Wound Status: ?No No N/A ?Wound Recurrence: ?3.1x2x0.1 1.1x1.6x0.1 N/A ?Measurements L x W x D (cm) ?4.869 1.382 N/A ?A (cm?) : ?rea ?0.487 0.138 N/A ?Volume (cm?) : ?-129.60% -318.80% N/A ?% Reduction in A rea: ?-129.70% -318.20% N/A ?% Reduction in Volume: ?Grade 1 Grade 1 N/A ?Classification: ?Medium Medium N/A ?Exudate A mount: ?Serosanguineous Serosanguineous N/A ?Exudate Type: ?red, brown red, brown N/A ?Exudate Color: ?Flat and Intact Flat and Intact N/A ?Wound Margin: ?Medium (34-66%) Large (67-100%) N/A ?Granulation A mount: ?Red, Friable Red N/A ?Granulation Quality: ?Medium (34-66%) Small (1-33%) N/A ?Necrotic A mount: ?Adherent BillingsNecrotic Tissue: ?Fat Layer (Subcutaneous Tissue): Yes Fat Layer (Subcutaneous Tissue): Yes N/A ?Exposed Structures: ?Fascia: No ?Tendon: No ?Muscle: No ?Joint: No ?Bone: No ?Small (1-33%) Small (1-33%) N/A ?Epithelialization: ?Debridement - Excisional Debridement - Excisional N/A ?Debridement: ?Other Other N/A ?Pain Control: ?Subcutaneous, Slough Subcutaneous, Slough N/A ?Tissue  Debrided: ?Skin/Subcutaneous Tissue Skin/Subcutaneous Tissue N/A ?Level: ?6.2 1.76 N/A ?Debridement A (sq cm): ?rea ?Curette Curette N/A ?Instrument: ?Minimum Minimum N/A ?Bleeding: ?Pressure Pressure N/A ?Hemostasis A chieved: ?0 0 N/A ?Procedural Pain: ?0 0 N/A ?Post Procedural Pain: ?Debridement Treatment Response: Procedure was tolerated well Procedure was tolerated well N/A ?Post Debridement Measurements L x 3.1x2x0.1 1.1x1.6x0.1 N/A ?W x D (cm) ?0.487 0.138 N/A ?Post Debridement Volume: (cm?) ?Debridement Debridement N/A ?Procedures Performed: ?Treatment Notes ?Electronic Signature(s) ?Signed: 05/01/2022 11:11:11 AM By: Fredirick Maudlin MD FACS ?Signed: 05/01/2022 5:11:51 PM By: Dellie Catholic RN ?Entered By: Fredirick Maudlin on 05/01/2022 11:11:11 ?-------------------------------------------------------------------------------- ?Multi-Disciplinary Care Plan Details ?Patient Name: ?Date of Service: ?[name]. 05/01/2022 10:15 A M ?Medical Record Number: [id number] ?Patient Account Number: [id number] ?Date of Birth/Sex: ?Treating RN: ?[birth year] (71 y.o. [name]) Dellie Catholic ?Primary Care Quantae Martel: [name] ?Other Clinician: ?Referring Chauntel Windsor: ?Treating Crisanto Nied/Extender: Fredirick Maudlin ?[name] ?Weeks in Treatment: 2 ?Active Inactive ?Wound/Skin Impairment ?Nursing Diagnoses: ?Impaired tissue integrity ?Goals: ?Patient/caregiver will verbalize understanding of skin care regimen ?Date Initiated: 04/17/2022 ?Target Resolution Date: 05/19/2022 ?Goal Status: Active ?Interventions: ?Assess ulceration(s) every visit ?Treatment Activities: ?Skin care regimen initiated : 04/17/2022 ?Notes: ?Electronic Signature(s) ?Signed: 05/01/2022 5:11:51 PM By: Dellie Catholic RN ?Entered By: Dellie Catholic on 05/01/2022 17:10:03 ?-------------------------------------------------------------------------------- ?Pain Assessment Details ?Patient Name: ?Date of Service: ?[name]. 05/01/2022 10:15 A  M ?Medical Record Number: [id number] ?Patient Account Number: [id number] ?Date of Birth/Sex: ?Treating RN: ?[birth year] (71 y.o. [name]) Dellie Catholic ?Primary Care Jessabelle Markiewicz: [name] ?Other Clinician: ?Referring Yena Tisby: ?Tona Sensing

## 2022-05-01 NOTE — Progress Notes (Signed)
FIELDS, OROS ([id number]) ?Marland Kitchen ?Visit Report for 05/01/2022 ?Chief Complaint Document Details ?Patient Name: Date of Service: ?Marcus Apple M T. 05/01/2022 10:15 A M ?Medical Record Number: [id number] ?Patient Account Number: [id number] ?Date of Birth/Sex: Treating RN: ?10-03-1951 (71 y.o. [name]) Dellie Catholic ?Primary Care Provider: [name] Other Clinician: ?Referring Provider: ?Treating Provider/Extender: Fredirick Maudlin ?[name] ?Weeks in Treatment: 2 ?Information Obtained from: Patient ?Chief Complaint ?Patient seen for complaints of Non-Healing Wounds. ?Electronic Signature(s) ?Signed: 05/01/2022 11:11:17 AM By: Fredirick Maudlin MD FACS ?Entered By: Fredirick Maudlin on 05/01/2022 11:11:17 ?-------------------------------------------------------------------------------- ?Debridement Details ?Patient Name: Date of Service: ?Marcus Apple M T. 05/01/2022 10:15 A M ?Medical Record Number: [id number] ?Patient Account Number: [id number] ?Date of Birth/Sex: Treating RN: ?12-28-1950 (71 y.o. [name]) Dellie Catholic ?Primary Care Provider: [name] Other Clinician: ?Referring Provider: ?Treating Provider/Extender: Fredirick Maudlin ?[name] ?Weeks in Treatment: 2 ?Debridement Performed for Assessment: Wound #3 Left,Anterior Lower Leg ?Performed By: Physician Fredirick Maudlin, MD ?Debridement Type: Debridement ?Severity of Tissue Pre Debridement: Fat layer exposed ?Level of Consciousness (Pre-procedure): Awake and Alert ?Pre-procedure Verification/Time Out No ?Taken: ?Start Time: 10:55 ?Pain Control: ?Other : benzocaine 20% ?T Area Debrided (L x W): ?otal 3.1 (cm) x 2 (cm) = 6.2 (cm?) ?Tissue and other material debrided: Non-Viable, Slough, Subcutaneous, [place] ?Level: Skin/Subcutaneous Tissue ?Debridement Description: Excisional ?Instrument: Curette ?Bleeding: Minimum ?Hemostasis Achieved: Pressure ?End Time: 10:58 ?Procedural Pain: 0 ?Post Procedural Pain: 0 ?Response to Treatment: Procedure was  tolerated well ?Level of Consciousness (Post- Awake and Alert ?procedure): ?Post Debridement Measurements of Total Wound ?Length: (cm) 3.1 ?Width: (cm) 2 ?Depth: (cm) 0.1 ?Volume: (cm?) 0.487 ?Character of Wound/Ulcer Post Debridement: Improved ?Severity of Tissue Post Debridement: Fat layer exposed ?Post Procedure Diagnosis ?Same as Pre-procedure ?Electronic Signature(s) ?Signed: 05/01/2022 12:32:15 PM By: Fredirick Maudlin MD FACS ?Signed: 05/01/2022 5:11:51 PM By: Dellie Catholic RN ?Entered By: Dellie Catholic on 05/01/2022 11:03:24 ?-------------------------------------------------------------------------------- ?Debridement Details ?Patient Name: ?Date of Service: ?Marcus Apple M T. 05/01/2022 10:15 A M ?Medical Record Number: [id number] ?Patient Account Number: [id number] ?Date of Birth/Sex: ?Treating RN: ?1951-01-08 (71 y.o. [name]) Dellie Catholic ?Primary Care Provider: [name] ?Other Clinician: ?Referring Provider: ?Treating Provider/Extender: Fredirick Maudlin ?[name] ?Weeks in Treatment: 2 ?Debridement Performed for Assessment: Wound #4 Left,Lateral Lower Leg ?Performed By: Physician Fredirick Maudlin, MD ?Debridement Type: Debridement ?Severity of Tissue Pre Debridement: Fat layer exposed ?Level of Consciousness (Pre-procedure): Awake and Alert ?Pre-procedure Verification/Time Out No ?Taken: ?Start Time: 10:55 ?Pain Control: ?Other : benzocaine 20% ?T Area Debrided (L x W): ?otal 1.1 (cm) x 1.6 (cm) = 1.76 (cm?) ?Tissue and other material debrided: Non-Viable, Slough, Subcutaneous, [place] ?Level: Skin/Subcutaneous Tissue ?Debridement Description: Excisional ?Instrument: Curette ?Bleeding: Minimum ?Hemostasis Achieved: Pressure ?End Time: 10:58 ?Procedural Pain: 0 ?Post Procedural Pain: 0 ?Response to Treatment: Procedure was tolerated well ?Level of Consciousness (Post- Awake and Alert ?procedure): ?Post Debridement Measurements of Total Wound ?Length: (cm) 1.1 ?Width: (cm) 1.6 ?Depth: (cm)  0.1 ?Volume: (cm?) 0.138 ?Character of Wound/Ulcer Post Debridement: Improved ?Severity of Tissue Post Debridement: Fat layer exposed ?Post Procedure Diagnosis ?Same as Pre-procedure ?Electronic Signature(s) ?Signed: 05/01/2022 12:32:15 PM By: Fredirick Maudlin MD FACS ?Signed: 05/01/2022 5:11:51 PM By: Dellie Catholic RN ?Entered By: Dellie Catholic on 05/01/2022 11:04:21 ?-------------------------------------------------------------------------------- ?HPI Details ?Patient Name: Date of Service: ?Marcus Apple M T. 05/01/2022 10:15 A M ?Medical Record Number: [id number] ?Patient Account Number: [id number] ?Date of Birth/Sex: Treating RN: ?Oct 01, 1951 (71 y.o. [name]) Dellie Catholic ?Primary Care Provider: [name] Other  Clinician: ?Referring Provider: ?Treating Provider/Extender: Fredirick Maudlin ?[name] ?Weeks in Treatment: 2 ?History of Present Illness ?HPI Description: 11/10/2020 patient presents today for initial evaluation in this clinic although I have seen this patient in [place] previous. Subsequently his ?issue today is different from when I saw him for I was treating him for a toe ulcer. Currently he is having issues with bilateral lower extremity ulcers after ?running into a metal bar. Upon inspection today he has wounds of the bilateral lower extremities which are consistent with having struck his legs on the anterior ?aspect. With that being said the patient did go to the ER for evaluation on October 11 where he was given Keflex initially. He went back to the ER after not ?improving on October 20 he was given Lasix at that point he states the Lasix seem to help the most. Has been using Neosporin and leaving this open area ?which is probably not the best way to go either to be honest. He has had arterial studies on March 2021 which showed a left ABI of 0.78 with a TBI of 0.34 and ?a right ABI of 1.18 with a TBI 1.18. Nonetheless his arterial flow the left is not ideal but also I think potentially  consistent with allowing this to heal. I think he ?can also support light compression with Kerlix and Coban based on this result. His most recent hemoglobin A1c that we could find was 7.3 and that was towards ?the end of 2020. The patient does have a history of heart disease unfortunately. He is also currently still a smoker. ?11/24/2020 on evaluation today patient appears to be doing well with regard to his wounds. In fact both appear to be doing better after last week's rather last ?visit's debridement. Fortunately there is no signs of active infection at this time. No fevers, chills, nausea, vomiting, or diarrhea. Overall I am very pleased ?with where things stand today. ?12/01/2020 on evaluation today patient appears to be doing well with regard to his leg ulcers. He has been tolerating the dressing changes without complication. ?Fortunately there is no signs of active infection at this time. No fevers, chills, nausea, vomiting, or diarrhea. ?12/08/2020 on evaluation today patient appears to be doing very well in regard to his bilateral lower extremity ulcers. They seem to be doing excellent and he is ?making great progress. There does not appear to be any evidence of active infection at this time. No fevers, chills, nausea, vomiting, or diarrhea. ?12/15/2020 upon evaluation today patient actually appears to be doing excellent in regard to his wounds. Has been tolerating the dressing changes without ?complication. Fortunately his wounds are showing signs of great improvement were using Hydrofera Blue. ?12/29/2020 on evaluation today patient appears to be doing well with regard to his right leg which is completely healed. His left leg is also measuring smaller than ?not healed this is doing very well. Fortunately there is no signs of active infection at this time ?01/05/2021 upon evaluation today patient actually appears to be making signs of improvement with regard to his left leg. I am very pleased with how things  are ?progressing. There is no evidence of active infection at this time. No fevers, chills, nausea, vomiting, or diarrhea. Patient is extremely happy with the fact that ?this is doing so well ?01/12/2021 upon evaluat

## 2022-05-03 ENCOUNTER — Ambulatory Visit: Payer: PPO | Admitting: Urology

## 2022-05-03 NOTE — Progress Notes (Incomplete)
? ?05/03/22 ?6:19 AM  ? ?[name] ?May 09, 1951 ?[id number] ? ?Referring provider:  ?[name], PA-C ?[address] ?[address] ?[place],  [place] [address] ?No chief complaint on file. ? ? ? ? ?HPI: ?[name] is a 71 y.o.male with a personal history of gross hematuria, renal calculus, benign tumor on left kidney, prostate cancer, BPH with LUTS and other extensive GU issues, who presents today for a 3 month follow-up with IPSS and PVR.  ? ?He is s/p ESWL in 2014. He reports that he has never passed a stone he always had stone surgery, he never had any pain with his stones. ?  ?He is s/p cystolitholapaxy of bladder calcification in 2019.  ?  ?He was evaluated for urothelial malignancy of the right renal pelvis which showed no evidence for malignancy in 2019.  ?  ?He is s/p ADT on 06/26/2018. He is also s/p radiotherapy (brachytherapy) for his prostate cancer. PSA was undetectable 8 months ago. ? ?He is currently on medical therapy for BPH with finasteride, flomax, and myrbetriq.  ? ?PMH: ?Past Medical History:  ?Diagnosis Date  ? Abdominal aortic aneurysm (AAA) ([place])   ? 3 cm per 07-02-18 US abdominal aorta US epic  ? Arthritis   ? Blood clot in vein   ? behind left knee  ? Chronic kidney disease   ? renal calculi  ? Chronic venous insufficiency LOWER EXTREMITIES  ? Coronary artery disease CARDIOLOGIST - DR  [id number]-  LAST 1 WK AGO -- WILL REQUEST NOTE AND STRESS TEST  ? Diabetes mellitus without complication ([place])   ? type 2  ? Hematuria   ? last year  ? Hyperglycemia   ? Hyperlipidemia   ? Hypertension   ? Mixed dyslipidemia   ? Myocardial infarction [facility])   ? Neuropathy   ? toes only  ? Nocturia   ? Prostate cancer ([place]) 08/18/11  ? gleason 8, volume 24.4cc  ? S/P CABG x 4   ? ST elevation MI (STEMI) ([place]) 02-10-2008  ? S/P CABG  ? Stroke [facility]) 2016  ? occ trouble wriing with right hand  ? Urinary hesitancy   ? Vision abnormalities   ? resolved now  ? Wound discharge   ? since jan 2019 goes to wound  center [facility] left great toe small open area changes dressing q 2 days with ointment provided by wound center, clear drainage occ  ? ? ?Surgical History: ?Past Surgical History:  ?Procedure Laterality Date  ? CARDIAC CATHETERIZATION  05/01/2012  ? grafts widely patent  ? CARDIOVASCULAR STRESS TEST  03-15-2010  ? INFERIOR WALL SCAR WITHOUT ANY MEANINGFUL ISCHEMIA/ EF 46% / LOW RISK SCAN  ? CATARACT EXTRACTION    ? CORONARY ARTERY BYPASS GRAFT  02-10-2008  DR Einar Gip  ? X4 VESSEL DISEASE / EMERGANT CABG  ? CYSTOSCOPY  02/08/2012  ? Procedure: CYSTOSCOPY FLEXIBLE;  Surgeon: Franchot Gallo, MD;  Location: [facility];  Service: Urology;;  ? [place], URETEROSCOPY AND STENT PLACEMENT Right 09/12/2018  ? Procedure: CYSTOSCOPY WITH RETROGRADE PYELOGRAM, URETEROSCOPY AND STENT PLACEMENT, STONE EXTRACTION;  Surgeon: Franchot Gallo, MD;  Location: [facility];  Service: Urology;  Laterality: Right;  ? EP IMPLANTABLE DEVICE N/A 08/14/2016  ? Procedure: Loop Recorder Insertion;  Surgeon: Evans Lance, MD;  Location: [facility];  Service: Cardiovascular;  Laterality: N/A;  ? EXTRACORPOREAL SHOCK WAVE LITHOTRIPSY  2013  ? HOLMIUM LASER APPLICATION Right 2/99/3716  ? Procedure: HOLMIUM LASER  APPLICATION;  Surgeon: Franchot Gallo, MD;  Location: [facility];  Service: Urology;  Laterality: Right;  ? KNEE SURGERY  age 80  ? RIGHT  ? LEFT HEART CATHETERIZATION WITH CORONARY/GRAFT ANGIOGRAM N/A 05/01/2012  ? Procedure: LEFT HEART CATHETERIZATION WITH Beatrix Fetters;  Surgeon: Sanda Klein, MD;  Location: [facility] CATH LAB;  Service: Cardiovascular;  Laterality: N/A;  ? MULTIPLE TEETH EXTRACTIONS (23)/ FOUR QUADRANT ALVEOLPLASTY/ MANDIBULAR LATERAL EXOSTOSES REDUCTIONS  03-24-2010  ? CHRONIC PERIODONTITIS  ? RADIOACTIVE SEED IMPLANT  02/08/2012  ? Procedure: RADIOACTIVE SEED IMPLANT;  Surgeon: Franchot Gallo, MD;  Location: [facility];  Service: Urology;   Laterality: N/A;  C-ARM ?  ? [place]  ? LEFT  ? TEE WITHOUT CARDIOVERSION N/A 08/14/2016  ? Procedure: TRANSESOPHAGEAL ECHOCARDIOGRAM (TEE);  Surgeon: Josue Hector, MD;  Location: [place];  Service: Cardiovascular;  Laterality: N/A;  ? URETERAL STENT PLACEMENT    ? ? ?Home Medications:  ?Allergies as of 05/03/2022   ? ?   Reactions  ? Bee Venom Anaphylaxis  ? Penicillins Other (See Comments)  ? CAUSES "FREE BLEEDING" ?Has patient had a PCN reaction causing immediate rash, facial/tongue/throat swelling, SOB or lightheadedness with hypotension: No ?Has patient had a PCN reaction causing severe rash involving mucus membranes or skin necrosis: No ?Has patient had a PCN reaction that required hospitalization No ?Has patient had a PCN reaction occurring within the last 10 years: No ?If all of the above answers are "NO", then may proceed with Cephalosporin use.  ? ?  ? ?  ?Medication List  ?  ? ?  ? Accurate as of May 03, 2022  6:19 AM. If you have any questions, ask your nurse or doctor.  ?  ?  ? ?  ? ?apixaban 5 MG Tabs tablet ?Commonly known as: Eliquis ?Take 1 tablet (5 mg total) by mouth 2 (two) times daily. ?  ?atorvastatin 10 MG tablet ?Commonly known as: LIPITOR ?Take 1 tablet by mouth once daily ?  ?blood glucose meter kit and supplies Kit ?Dispense based on patient and insurance preference. ?  ?dapagliflozin propanediol 10 MG Tabs tablet ?Commonly known as: [place] ?Take 1 tablet (10 mg total) by mouth daily before breakfast. ?  ?diclofenac Sodium 1 % Gel ?Commonly known as: Voltaren ?Apply 2 g topically 4 (four) times daily as needed. ?  ?finasteride 5 MG tablet ?Commonly known as: PROSCAR ?Take 5 mg by mouth daily. ?  ?FreeStyle Libre 14 Day Reader Kerrin Mo ?1 Device by Does not apply route daily. Use to check blood sugars every morning and 2 hours after largest meals ?  ?FreeStyle Libre 14 Day Sensor Misc ?USE TO CHECK BLOOD SUGARS IN THE MORNING FASTING AND  2  HOURS  AFTER  LARGEST  MEAL ?   ?furosemide 20 MG tablet ?Commonly known as: LASIX ?Take 1 tablet (20 mg total) by mouth daily as needed for edema. ?  ?gabapentin 300 MG capsule ?Commonly known as: NEURONTIN ?TAKE 2 CAPSULES BY MOUTH IN THE MORNING AND 2 IN THE EVENING AT  DINNERTIME ?  ?glucose blood test strip ?Commonly known as: FREESTYLE LITE ?Use to check blood sugars every morning fasting ?  ?lisinopril 5 MG tablet ?Commonly known as: ZESTRIL ?Take 1 tablet by mouth once daily ?  ?metFORMIN 500 MG 24 hr tablet ?Commonly known as: GLUCOPHAGE-XR ?Take 1 tablet (500 mg total) by mouth daily with breakfast. ?  ?methocarbamol 500 MG tablet ?Commonly known as: ROBAXIN ?Take 1 tablet (500  mg total) by mouth every 8 (eight) hours as needed for muscle spasms. ?  ?mirabegron ER 50 MG Tb24 tablet ?Commonly known as: MYRBETRIQ ?Take 1 tablet (50 mg total) by mouth daily. ?  ?multivitamin with minerals Tabs tablet ?Take 1 tablet by mouth daily. Men's One-A-Day Multivitamin ?  ?mupirocin cream 2 % ?Commonly known as: Bactroban ?Apply 1 application topically 2 (two) times daily. ?  ?oxybutynin 5 MG 24 hr tablet ?Commonly known as: DITROPAN-XL ?Take 5 mg by mouth at bedtime. ?  ?tamsulosin 0.4 MG Caps capsule ?Commonly known as: FLOMAX ?Take 1 capsule by mouth once daily at bedtime ?  ?Wellbutrin SR 150 MG 12 hr tablet ?Generic drug: buPROPion ?Take 1 tablet (150 mg total) by mouth 2 (two) times daily. ?  ? ?  ? ? ?Allergies:  ?Allergies  ?Allergen Reactions  ? Bee Venom Anaphylaxis  ? Penicillins Other (See Comments)  ?  CAUSES "FREE BLEEDING" ?Has patient had a PCN reaction causing immediate rash, facial/tongue/throat swelling, SOB or lightheadedness with hypotension: No ?Has patient had a PCN reaction causing severe rash involving mucus membranes or skin necrosis: No ?Has patient had a PCN reaction that required hospitalization No ?Has patient had a PCN reaction occurring within the last 10 years: No ?If all of the above answers are "NO", then may  proceed with Cephalosporin use.  ? ? ?Family History: ?Family History  ?Problem Relation Age of Onset  ? Cancer Father   ?     pancreatic  ? Diabetes Father   ? ? ?Social History:  reports that he has been smoking cigar

## 2022-05-05 ENCOUNTER — Encounter: Payer: Self-pay | Admitting: Urology

## 2022-05-08 ENCOUNTER — Encounter (HOSPITAL_BASED_OUTPATIENT_CLINIC_OR_DEPARTMENT_OTHER): Payer: PPO | Admitting: General Surgery

## 2022-05-08 DIAGNOSIS — L97822 Non-pressure chronic ulcer of other part of left lower leg with fat layer exposed: Secondary | ICD-10-CM | POA: Diagnosis not present

## 2022-05-08 DIAGNOSIS — E11622 Type 2 diabetes mellitus with other skin ulcer: Secondary | ICD-10-CM | POA: Diagnosis not present

## 2022-05-08 NOTE — Progress Notes (Signed)
[name] ([id number]) ?Marland Kitchen ?Visit Report for 05/08/2022 ?Chief Complaint Document Details ?Patient Name: Date of Service: ?Marcus Apple M T. 05/08/2022 8:30 A M ?Medical Record Number: [id number] ?Patient Account Number: [id number] ?Date of Birth/Sex: Treating RN: ?1951/03/12 (71 y.o. [name] ?Primary Care Provider: [name] Other Clinician: ?Referring Provider: ?Treating Provider/Extender: Fredirick Maudlin ?[name] ?Weeks in Treatment: 3 ?Information Obtained from: Patient ?Chief Complaint ?Patient seen for complaints of Non-Healing Wounds. ?Electronic Signature(s) ?Signed: 05/08/2022 9:24:22 AM By: Fredirick Maudlin MD FACS ?Entered By: Fredirick Maudlin on 05/08/2022 09:24:22 ?-------------------------------------------------------------------------------- ?Debridement Details ?Patient Name: Date of Service: ?Marcus Apple M T. 05/08/2022 8:30 A M ?Medical Record Number: [id number] ?Patient Account Number: [id number] ?Date of Birth/Sex: Treating RN: ?Aug 19, 1951 (71 y.o. [name] ?Primary Care Provider: [name] Other Clinician: ?Referring Provider: ?Treating Provider/Extender: Fredirick Maudlin ?[name] ?Weeks in Treatment: 3 ?Debridement Performed for Assessment: Wound #3 Left,Anterior Lower Leg ?Performed By: Physician Fredirick Maudlin, MD ?Debridement Type: Debridement ?Severity of Tissue Pre Debridement: Fat layer exposed ?Level of Consciousness (Pre-procedure): Awake and Alert ?Pre-procedure Verification/Time Out Yes - 09:14 ?Taken: ?Start Time: 09:14 ?Pain Control: Lidocaine 4% T opical Solution ?T Area Debrided (L x W): ?otal 2.5 (cm) x 1.6 (cm) = 4 (cm?) ?Tissue and other material debrided: Viable, Non-Viable, Slough, Subcutaneous, Slough ?Level: Skin/Subcutaneous Tissue ?Debridement Description: Excisional ?Instrument: Curette ?Bleeding: Minimum ?Hemostasis Achieved: Pressure ?Procedural Pain: 0 ?Post Procedural Pain: 0 ?Response to Treatment:  Procedure was tolerated well ?Level of Consciousness (Post- Awake and Alert ?procedure): ?Post Debridement Measurements of Total Wound ?Length: (cm) 2.5 ?Width: (cm) 1.6 ?Depth: (cm) 0.1 ?Volume: (cm?) 0.314 ?Character of Wound/Ulcer Post Debridement: Improved ?Severity of Tissue Post Debridement: Fat layer exposed ?Post Procedure Diagnosis ?Same as Pre-procedure ?Electronic Signature(s) ?Signed: 05/08/2022 9:56:03 AM By: Fredirick Maudlin MD FACS ?Signed: 05/08/2022 5:51:04 PM By: Adline Peals ?Entered By: Adline Peals on 05/08/2022 09:15:04 ?-------------------------------------------------------------------------------- ?Debridement Details ?Patient Name: ?Date of Service: ?Marcus Ileana Ladd M T. 05/08/2022 8:30 A M ?Medical Record Number: [id number] ?Patient Account Number: [id number] ?Date of Birth/Sex: ?Treating RN: ?09/09/1951 (71 y.o. [name] ?Primary Care Provider: [name] ?Other Clinician: ?Referring Provider: ?Treating Provider/Extender: Fredirick Maudlin ?[name] ?Weeks in Treatment: 3 ?Debridement Performed for Assessment: Wound #4 Left,Lateral Lower Leg ?Performed By: Physician Fredirick Maudlin, MD ?Debridement Type: Debridement ?Severity of Tissue Pre Debridement: Fat layer exposed ?Level of Consciousness (Pre-procedure): Awake and Alert ?Pre-procedure Verification/Time Out Yes - 09:14 ?Taken: ?Start Time: 09:14 ?Pain Control: Lidocaine 4% T opical Solution ?T Area Debrided (L x W): ?otal 0.7 (cm) x 1.2 (cm) = 0.84 (cm?) ?Tissue and other material debrided: Viable, Non-Viable, Slough, Subcutaneous, Slough ?Level: Skin/Subcutaneous Tissue ?Debridement Description: Excisional ?Instrument: Curette ?Bleeding: Minimum ?Hemostasis Achieved: Pressure ?Procedural Pain: 0 ?Post Procedural Pain: 0 ?Response to Treatment: Procedure was tolerated well ?Level of Consciousness (Post- Awake and Alert ?procedure): ?Post Debridement Measurements of Total Wound ?Length: (cm) 0.7 ?Width:  (cm) 1.2 ?Depth: (cm) 0.1 ?Volume: (cm?) 0.066 ?Character of Wound/Ulcer Post Debridement: Improved ?Severity of Tissue Post Debridement: Fat layer exposed ?Post Procedure Diagnosis ?Same as Pre-procedure ?Electronic Signature(s) ?Signed: 05/08/2022 9:56:03 AM By: Fredirick Maudlin MD FACS ?Signed: 05/08/2022 5:51:04 PM By: Adline Peals ?Entered By: Adline Peals on 05/08/2022 09:15:29 ?-------------------------------------------------------------------------------- ?HPI Details ?Patient Name: ?Date of Service: ?Marcus Ileana Ladd M T. 05/08/2022 8:30 A M ?Medical Record Number: [id number] ?Patient Account Number: [id number] ?Date of Birth/Sex: Treating RN: ?02-17-1951 (71 y.o. [name] ?Primary Care Provider: [name] Other  Clinician: ?Referring Provider: ?Treating Provider/Extender: Fredirick Maudlin ?[name] ?Weeks in Treatment: 3 ?History of Present Illness ?HPI Description: 11/10/2020 patient presents today for initial evaluation in this clinic although I have seen this patient in [place] previous. Subsequently his ?issue today is different from when I saw him for I was treating him for a toe ulcer. Currently he is having issues with bilateral lower extremity ulcers after ?running into a metal bar. Upon inspection today he has wounds of the bilateral lower extremities which are consistent with having struck his legs on the anterior ?aspect. With that being said the patient did go to the ER for evaluation on October 11 where he was given Keflex initially. He went back to the ER after not ?improving on October 20 he was given Lasix at that point he states the Lasix seem to help the most. Has been using Neosporin and leaving this open area ?which is probably not the best way to go either to be honest. He has had arterial studies on March 2021 which showed a left ABI of 0.78 with a TBI of 0.34 and ?a right ABI of 1.18 with a TBI 1.18. Nonetheless his arterial flow the left is not  ideal but also I think potentially consistent with allowing this to heal. I think he ?can also support light compression with Kerlix and Coban based on this result. His most recent hemoglobin A1c that we could find was 7.3 and that was towards ?the end of 2020. The patient does have a history of heart disease unfortunately. He is also currently still a smoker. ?11/24/2020 on evaluation today patient appears to be doing well with regard to his wounds. In fact both appear to be doing better after last week's rather last ?visit's debridement. Fortunately there is no signs of active infection at this time. No fevers, chills, nausea, vomiting, or diarrhea. Overall I am very pleased ?with where things stand today. ?12/01/2020 on evaluation today patient appears to be doing well with regard to his leg ulcers. He has been tolerating the dressing changes without complication. ?Fortunately there is no signs of active infection at this time. No fevers, chills, nausea, vomiting, or diarrhea. ?12/08/2020 on evaluation today patient appears to be doing very well in regard to his bilateral lower extremity ulcers. They seem to be doing excellent and he is ?making great progress. There does not appear to be any evidence of active infection at this time. No fevers, chills, nausea, vomiting, or diarrhea. ?12/15/2020 upon evaluation today patient actually appears to be doing excellent in regard to his wounds. Has been tolerating the dressing changes without ?complication. Fortunately his wounds are showing signs of great improvement were using Hydrofera Blue. ?12/29/2020 on evaluation today patient appears to be doing well with regard to his right leg which is completely healed. His left leg is also measuring smaller than ?not healed this is doing very well. Fortunately there is no signs of active infection at this time ?01/05/2021 upon evaluation today patient actually appears to be making signs of improvement with regard to his left leg.  I am very pleased with how things are ?progressing. There is no evidence of active infection at this time. No fevers, chills, nausea, vomiting, or diarrhea. Patient is extremely happy with the fact that ?this is

## 2022-05-08 NOTE — Progress Notes (Signed)
ANDRAE, CLAUNCH ([id number]) ?Marland Kitchen ?Visit Report for 05/08/2022 ?Arrival Information Details ?Patient Name: Date of Service: ?Marcus Apple M T. 05/08/2022 8:30 A M ?Medical Record Number: [id number] ?Patient Account Number: [id number] ?Date of Birth/Sex: Treating RN: ?24-Jan-1951 (71 y.o. [name] ?Primary Care Carroll Ranney: [name] Other Clinician: ?Referring Marthann Abshier: ?Treating Hale Chalfin/Extender: Fredirick Maudlin ?[name] ?Weeks in Treatment: 3 ?Visit Information History Since Last Visit ?Added or deleted any medications: No ?Patient Arrived: Ambulatory ?Any new allergies or adverse reactions: No ?Arrival Time: 08:40 ?Had a fall or experienced change in No ?Accompanied By: self ?activities of daily living that may affect ?Transfer Assistance: None ?risk of falls: ?Patient Identification Verified: Yes ?Signs or symptoms of abuse/neglect since last visito No ?Secondary Verification Process Completed: Yes ?Hospitalized since last visit: No ?Patient Requires Transmission-Based Precautions: No ?Implantable device outside of the clinic excluding No ?Patient Has Alerts: Yes ?cellular tissue based products placed in the center ?Patient Alerts: Patient on Blood Thinner since last visit: ?ABI R 1.11 Has Dressing in Place as Prescribed: Yes ?ABI L 0.72 ?Has Compression in Place as Prescribed: Yes ?TBI L 0.33 ?Pain Present Now: No ?Electronic Signature(s) ?Signed: 05/08/2022 5:51:04 PM By: Adline Peals ?Entered By: Adline Peals on 05/08/2022 08:44:08 ?-------------------------------------------------------------------------------- ?Encounter Discharge Information Details ?Patient Name: Date of Service: ?Marcus Apple M T. 05/08/2022 8:30 A M ?Medical Record Number: [id number] ?Patient Account Number: [id number] ?Date of Birth/Sex: Treating RN: ?February 23, 1951 (71 y.o. [name] ?Primary Care Juliett Eastburn: [name] Other Clinician: ?Referring Yakir Wenke: ?Treating Letonya Mangels/Extender: Fredirick Maudlin ?[name] ?Weeks in Treatment: 3 ?Encounter Discharge Information Items Post Procedure Vitals ?Discharge Condition: Stable ?Temperature (F): 97.7 ?Ambulatory Status: Ambulatory ?Pulse (bpm): 69 ?Discharge Destination: Home ?Respiratory Rate (breaths/min): 18 ?Transportation: Private Auto ?Blood Pressure (mmHg): 128/76 ?Accompanied By: self ?Schedule Follow-up Appointment: Yes ?Clinical Summary of Care: Patient Declined ?Electronic Signature(s) ?Signed: 05/08/2022 5:51:04 PM By: Adline Peals ?Entered By: Adline Peals on 05/08/2022 09:30:41 ?-------------------------------------------------------------------------------- ?Lower Extremity Assessment Details ?Patient Name: ?Date of Service: ?Marcus Ileana Ladd M T. 05/08/2022 8:30 A M ?Medical Record Number: [id number] ?Patient Account Number: [id number] ?Date of Birth/Sex: ?Treating RN: ?[birth year] (71 y.o. [name] ?Primary Care Burna Atlas: [name] ?Other Clinician: ?Referring Avika Carbine: ?Treating Estalee Mccandlish/Extender: Fredirick Maudlin ?[name] ?Weeks in Treatment: 3 ?Edema Assessment ?Assessed: [Left: No] [Right: No] ?E[Left: dema] [Right: :] ?Calf ?Left: Right: ?Point of Measurement: 34 cm From Medial Instep 37.2 cm ?Ankle ?Left: Right: ?Point of Measurement: 12 cm From Medial Instep 24.8 cm ?Vascular Assessment ?Pulses: ?Dorsalis Pedis ?Palpable: [Left:Yes] ?Electronic Signature(s) ?Signed: 05/08/2022 5:51:04 PM By: Adline Peals ?Entered By: Adline Peals on 05/08/2022 08:48:53 ?-------------------------------------------------------------------------------- ?Multi Wound Chart Details ?Patient Name: ?Date of Service: ?Marcus Ileana Ladd M T. 05/08/2022 8:30 A M ?Medical Record Number: [id number] ?Patient Account Number: [id number] ?Date of Birth/Sex: ?Treating RN: ?1951-08-03 (71 y.o. [name] ?Primary Care Keah Lamba: [name] ?Other Clinician: ?Referring Quandra Fedorchak: ?Treating Jayleen Afonso/Extender: Fredirick Maudlin ?[name] ?Weeks in Treatment: 3 ?Vital Signs ?Height(in): 70 ?Pulse(bpm): 69 ?Weight(lbs): 219 ?Blood Pressure(mmHg): 128/76 ?Body Mass Index(BMI): 31.4 ?Temperature(??F): 97.7 ?Respiratory Rate(breaths/min): 18 ?Photos: [N/A:N/A] ?Left, Anterior Lower Leg Left, Lateral Lower Leg N/A ?Wound Location: ?Trauma Trauma N/A ?Wounding Event: ?Diabetic Wound/Ulcer of the Lower Diabetic Wound/Ulcer of the Lower N/A ?Primary Etiology: ?Extremity Extremity ?Cataracts, Arrhythmia, Coronary Cataracts, Arrhythmia, Coronary N/A ?Comorbid History: ?Artery Disease, Hypertension, Artery Disease, Hypertension, ?Myocardial Infarction, Peripheral Myocardial Infarction, Peripheral ?Arterial Disease, Peripheral Venous Arterial Disease, Peripheral Venous ?Disease, Type II Diabetes, Disease, Type  II Diabetes, ?Osteoarthritis, Neuropathy, Received Osteoarthritis, Neuropathy, Received ?Radiation Radiation ?04/10/2022 04/10/2022 N/A ?Date Acquired: ?3 3 N/A ?Weeks of Treatment: ?Open Open N/A ?Wound Status: ?No No N/A ?Wound Recurrence: ?2.5x1.6x0.1 0.7x1.2x0.1 N/A ?Measurements L x W x D (cm) ?3.142 0.66 N/A ?A (cm?) : ?rea ?0.314 0.066 N/A ?Volume (cm?) : ?-48.10% -100.00% N/A ?% Reduction in A rea: ?-48.10% -100.00% N/A ?% Reduction in Volume: ?Grade 1 Grade 1 N/A ?Classification: ?Medium Medium N/A ?Exudate A mount: ?Serosanguineous Serosanguineous N/A ?Exudate Type: ?red, brown red, brown N/A ?Exudate Color: ?Flat and Intact Flat and Intact N/A ?Wound Margin: ?Small (1-33%) Large (67-100%) N/A ?Granulation A mount: ?Red Red N/A ?Granulation Quality: ?Large (67-100%) Small (1-33%) N/A ?Necrotic A mount: ?Adherent BlythewoodNecrotic Tissue: ?Fat Layer (Subcutaneous Tissue): Yes Fat Layer (Subcutaneous Tissue): Yes N/A ?Exposed Structures: ?Fascia: No ?Fascia: No ?Tendon: No ?Tendon: No ?Muscle: No ?Muscle: No ?Joint: No ?Joint: No ?Bone: No ?Bone: No ?Small (1-33%) Small (1-33%)  N/A ?Epithelialization: ?Debridement - Excisional Debridement - Excisional N/A ?Debridement: ?Pre-procedure Verification/Time Out 09:14 09:14 N/A ?Taken: ?Lidocaine 4% Topical Solution Lidocaine 4% Topical Solution N/A ?Pain Control: ?Subcutaneous, Slough Subcutaneous, Slough N/A ?Tissue Debrided: ?Skin/Subcutaneous Tissue Skin/Subcutaneous Tissue N/A ?Level: ?4 0.84 N/A ?Debridement A (sq cm): ?rea ?Curette Curette N/A ?Instrument: ?Minimum Minimum N/A ?Bleeding: ?Pressure Pressure N/A ?Hemostasis A chieved: ?0 0 N/A ?Procedural Pain: ?0 0 N/A ?Post Procedural Pain: ?Procedure was tolerated well Procedure was tolerated well N/A ?Debridement Treatment Response: ?2.5x1.6x0.1 0.7x1.2x0.1 N/A ?Post Debridement Measurements L x ?W x D (cm) ?0.314 0.066 N/A ?Post Debridement Volume: (cm?) ?Debridement Debridement N/A ?Procedures Performed: ?Treatment Notes ?Electronic Signature(s) ?Signed: 05/08/2022 9:24:14 AM By: Fredirick Maudlin MD FACS ?Signed: 05/08/2022 5:51:04 PM By: Adline Peals ?Entered By: Fredirick Maudlin on 05/08/2022 09:24:14 ?-------------------------------------------------------------------------------- ?Multi-Disciplinary Care Plan Details ?Patient Name: ?Date of Service: ?Marcus Ileana Ladd M T. 05/08/2022 8:30 A M ?Medical Record Number: [id number] ?Patient Account Number: [id number] ?Date of Birth/Sex: ?Treating RN: ?02/29/1951 (71 y.o. [name] ?Primary Care Ellyson Rarick: [name] ?Other Clinician: ?Referring Madason Rauls: ?Treating Nesta Scaturro/Extender: Fredirick Maudlin ?[name] ?Weeks in Treatment: 3 ?Active Inactive ?Wound/Skin Impairment ?Nursing Diagnoses: ?Impaired tissue integrity ?Goals: ?Patient/caregiver will verbalize understanding of skin care regimen ?Date Initiated: 04/17/2022 ?Target Resolution Date: 05/19/2022 ?Goal Status: Active ?Interventions: ?Assess ulceration(s) every visit ?Treatment Activities: ?Skin care regimen initiated : 04/17/2022 ?Notes: ?Electronic  Signature(s) ?Signed: 05/08/2022 5:51:04 PM By: Adline Peals ?Entered By: Adline Peals on 05/08/2022 08:51:40 ?-------------------------------------------------------------------------------- ?Pain Assessment Details

## 2022-05-15 ENCOUNTER — Encounter (HOSPITAL_BASED_OUTPATIENT_CLINIC_OR_DEPARTMENT_OTHER): Payer: PPO | Admitting: General Surgery

## 2022-05-15 ENCOUNTER — Ambulatory Visit: Payer: PPO | Admitting: Pulmonary Disease

## 2022-05-15 ENCOUNTER — Other Ambulatory Visit: Payer: Self-pay | Admitting: Physician Assistant

## 2022-05-15 ENCOUNTER — Encounter: Payer: Self-pay | Admitting: Pulmonary Disease

## 2022-05-15 VITALS — BP 112/60 | HR 81 | Temp 97.7°F | Ht 70.0 in | Wt 223.8 lb

## 2022-05-15 DIAGNOSIS — R911 Solitary pulmonary nodule: Secondary | ICD-10-CM | POA: Diagnosis not present

## 2022-05-15 DIAGNOSIS — J432 Centrilobular emphysema: Secondary | ICD-10-CM

## 2022-05-15 DIAGNOSIS — E669 Obesity, unspecified: Secondary | ICD-10-CM

## 2022-05-15 DIAGNOSIS — Z720 Tobacco use: Secondary | ICD-10-CM | POA: Diagnosis not present

## 2022-05-15 NOTE — Progress Notes (Signed)
Synopsis: Referred in May 2023 for lung nodule by Marcus Reid, PA-C  Subjective:   PATIENT ID: [name] GENDER: male DOB: 10-Feb-1951, MRN: [id number]  Chief Complaint  Patient presents with   Consult    Patient is here to talk about lung nodule.     This is a 71 year old gentleman, past medical history of coronary artery disease, diabetes, hypertension, hyperlipidemia, CABG x4, prostate cancer. Patient was referred for evaluation after having a lung cancer screening CT on 04/24/2022 which revealed a peripheral right upper lobe apical segment enlarged pulmonary nodule at approximately 11.8 mm.  Previously it was 5.8 mm.  He also has a calcified granuloma in the right lower lobe.  From respiratory standpoint he is doing okay.  He has been a longtime smoker since he was 71 years old still smoking today 1 pack/day.  He works at the [employer].  He has been doing this for many years.  He used to do it with his mom and dad.   Past Medical History:  Diagnosis Date   Abdominal aortic aneurysm (AAA) (HCC)    3 cm per 07-02-18 US abdominal aorta US epic   Arthritis    Blood clot in vein    behind left knee   Chronic kidney disease    renal calculi   Chronic venous insufficiency LOWER EXTREMITIES   Coronary artery disease CARDIOLOGIST - DR  [id number]-  LAST 1 WK AGO -- WILL REQUEST NOTE AND STRESS TEST   Diabetes mellitus without complication (HCC)    type 2   Hematuria    last year   Hyperglycemia    Hyperlipidemia    Hypertension    Mixed dyslipidemia    Myocardial infarction (HCC)    Neuropathy    toes only   Nocturia    Prostate cancer ([name]) 08/18/11   gleason 8, volume 24.4cc   S/P CABG x 4    ST elevation MI (STEMI) ([place]) 02-10-2008   S/P CABG   Stroke ([place]) 2016   occ trouble wriing with right hand   Urinary hesitancy    Vision abnormalities    resolved now   Wound discharge    since jan 2019 goes to wound center [place] left great toe small  open area changes dressing q 2 days with ointment provided by wound center, clear drainage occ     Family History  Problem Relation Age of Onset   Cancer Father        pancreatic   Diabetes Father      Past Surgical History:  Procedure Laterality Date   CARDIAC CATHETERIZATION  05/01/2012   grafts widely patent   CARDIOVASCULAR STRESS TEST  03-15-2010   INFERIOR WALL SCAR WITHOUT ANY MEANINGFUL ISCHEMIA/ EF 46% / LOW RISK SCAN   CATARACT EXTRACTION     CORONARY ARTERY BYPASS GRAFT  02-10-2008  DR [name]   X4 VESSEL DISEASE / EMERGANT CABG   CYSTOSCOPY  02/08/2012   Procedure: CYSTOSCOPY FLEXIBLE;  Surgeon: Marcus Gallo, MD;  Location: [facility];  Service: Urology;;   CYSTOSCOPY WITH RETROGRADE PYELOGRAM, URETEROSCOPY AND STENT PLACEMENT Right 09/12/2018   Procedure: CYSTOSCOPY WITH RETROGRADE PYELOGRAM, URETEROSCOPY AND STENT PLACEMENT, STONE EXTRACTION;  Surgeon: Marcus Gallo, MD;  Location: [facility];  Service: Urology;  Laterality: Right;   EP IMPLANTABLE DEVICE N/A 08/14/2016   Procedure: Loop Recorder Insertion;  Surgeon: Marcus Lance, MD;  Location: [place] [facility];  Service: Cardiovascular;  Laterality: N/A;  EXTRACORPOREAL SHOCK WAVE LITHOTRIPSY  2013   HOLMIUM LASER APPLICATION Right 08/19/4157   Procedure: HOLMIUM LASER APPLICATION;  Surgeon: Marcus Gallo, MD;  Location: [facility];  Service: Urology;  Laterality: Right;   KNEE SURGERY  age 89   RIGHT   LEFT HEART CATHETERIZATION WITH CORONARY/GRAFT ANGIOGRAM N/A 05/01/2012   Procedure: LEFT HEART CATHETERIZATION WITH Beatrix Fetters;  Surgeon: Marcus Klein, MD;  Location: [facility];  Service: Cardiovascular;  Laterality: N/A;   MULTIPLE TEETH EXTRACTIONS (23)/ FOUR QUADRANT ALVEOLPLASTY/ MANDIBULAR LATERAL EXOSTOSES REDUCTIONS  03-24-2010   CHRONIC PERIODONTITIS   RADIOACTIVE SEED IMPLANT  02/08/2012   Procedure: RADIOACTIVE SEED IMPLANT;  Surgeon: Marcus Gallo, MD;  Location:  [facility];  Service: Urology;  Laterality: N/A;  C-ARM    SHOULDER SURGERY  1982   LEFT   TEE WITHOUT CARDIOVERSION N/A 08/14/2016   Procedure: TRANSESOPHAGEAL ECHOCARDIOGRAM (TEE);  Surgeon: Marcus Hector, MD;  Location: [facility];  Service: Cardiovascular;  Laterality: N/A;   URETERAL STENT PLACEMENT      Social History   Socioeconomic History   Marital status: Married    Spouse name: Not on file   Number of children: Not on file   Years of education: Not on file   Highest education level: Not on file  Occupational History   Not on file  Tobacco Use   Smoking status: Every Day    Packs/day: 1.00    Years: 50.00    Pack years: 50.00    Types: Cigarettes   Smokeless tobacco: Never   Tobacco comments:    PREVIOUSLY SMOKED 3PPD / DECREASED TO 1PPD SINCE 2009  Vaping Use   Vaping Use: Never used  Substance and Sexual Activity   Alcohol use: Not Currently    Alcohol/week: 2.0 standard drinks    Types: 1 Cans of beer, 1 Shots of liquor per week    Comment: none in 3 years   Drug use: No   Sexual activity: Never  Other Topics Concern   Not on file  Social History Narrative   Not on file   Social Determinants of Health   Financial Resource Strain: Not on file  Food Insecurity: Not on file  Transportation Needs: Not on file  Physical Activity: Not on file  Stress: Not on file  Social Connections: Not on file  Intimate Partner Violence: Not on file     Allergies  Allergen Reactions   Bee Venom Anaphylaxis   Penicillins Other (See Comments)    CAUSES "FREE BLEEDING" Has patient had a PCN reaction causing immediate rash, facial/tongue/throat swelling, SOB or lightheadedness with hypotension: No Has patient had a PCN reaction causing severe rash involving mucus membranes or skin necrosis: No Has patient had a PCN reaction that required hospitalization No Has patient had a PCN reaction occurring within the last 10 years: No If all of the above  answers are "NO", then may proceed with Cephalosporin use.     Outpatient Medications Prior to Visit  Medication Sig Dispense Refill   apixaban (ELIQUIS) 5 MG TABS tablet Take 1 tablet (5 mg total) by mouth 2 (two) times daily. 180 tablet 3   atorvastatin (LIPITOR) 10 MG tablet Take 1 tablet by mouth once daily 90 tablet 0   blood glucose meter kit and supplies KIT Dispense based on patient and insurance preference. 1 each 0   Continuous Blood Gluc Receiver (FREESTYLE LIBRE 14 DAY READER) DEVI 1 Device by Does not apply route daily. Use to  check blood sugars every morning and 2 hours after largest meals 1 each 1   Continuous Blood Gluc Sensor (FREESTYLE LIBRE 14 DAY SENSOR) MISC USE TO CHECK BLOOD SUGARS IN THE MORNING FASTING AND  2  HOURS  AFTER  LARGEST  MEAL 6 each 0   finasteride (PROSCAR) 5 MG tablet Take 5 mg by mouth daily.     gabapentin (NEURONTIN) 300 MG capsule TAKE 2 CAPSULES BY MOUTH IN THE MORNING AND 2 IN THE EVENING AT  DINNERTIME 360 capsule 0   glucose blood (FREESTYLE LITE) test strip Use to check blood sugars every morning fasting 100 each 12   lisinopril (ZESTRIL) 5 MG tablet Take 1 tablet by mouth once daily 90 tablet 0   metFORMIN (GLUCOPHAGE-XR) 500 MG 24 hr tablet Take 1 tablet (500 mg total) by mouth daily with breakfast. 90 tablet 0   mirabegron ER (MYRBETRIQ) 50 MG TB24 tablet Take 1 tablet (50 mg total) by mouth daily. 30 tablet 11   Multiple Vitamin (MULTIVITAMIN WITH MINERALS) TABS tablet Take 1 tablet by mouth daily. Men's One-A-Day Multivitamin     oxybutynin (DITROPAN-XL) 5 MG 24 hr tablet Take 5 mg by mouth at bedtime.     tamsulosin (FLOMAX) 0.4 MG CAPS capsule Take 1 capsule by mouth once daily at bedtime 90 capsule 1   WELLBUTRIN SR 150 MG 12 hr tablet Take 1 tablet (150 mg total) by mouth 2 (two) times daily. 180 tablet 0   diclofenac Sodium (VOLTAREN) 1 % GEL Apply 2 g topically 4 (four) times daily as needed. (Patient not taking: Reported on 04/21/2022) 50  g 0   furosemide (LASIX) 20 MG tablet Take 1 tablet (20 mg total) by mouth daily as needed for edema. (Patient not taking: Reported on 04/21/2022) 30 tablet 0   methocarbamol (ROBAXIN) 500 MG tablet Take 1 tablet (500 mg total) by mouth every 8 (eight) hours as needed for muscle spasms. (Patient not taking: Reported on 04/21/2022) 20 tablet 0   mupirocin cream (BACTROBAN) 2 % Apply 1 application topically 2 (two) times daily. (Patient not taking: Reported on 04/21/2022) 15 g 0   dapagliflozin propanediol (FARXIGA) 10 MG TABS tablet Take 1 tablet (10 mg total) by mouth daily before breakfast. (Patient not taking: Reported on 04/21/2022) 90 tablet 0   No facility-administered medications prior to visit.    Review of Systems  Constitutional:  Negative for chills, fever, malaise/fatigue and weight loss.  HENT:  Negative for hearing loss, sore throat and tinnitus.   Eyes:  Negative for blurred vision and double vision.  Respiratory:  Positive for shortness of breath. Negative for cough, hemoptysis, sputum production, wheezing and stridor.   Cardiovascular:  Negative for chest pain, palpitations, orthopnea, leg swelling and PND.  Gastrointestinal:  Negative for abdominal pain, constipation, diarrhea, heartburn, nausea and vomiting.  Genitourinary:  Negative for dysuria, hematuria and urgency.  Musculoskeletal:  Negative for joint pain and myalgias.  Skin:  Negative for itching and rash.  Neurological:  Negative for dizziness, tingling, weakness and headaches.  Endo/Heme/Allergies:  Negative for environmental allergies. Does not bruise/bleed easily.  Psychiatric/Behavioral:  Negative for depression. The patient is not nervous/anxious and does not have insomnia.   All other systems reviewed and are negative.   Objective:  Physical Exam Vitals reviewed.  Constitutional:      General: He is not in acute distress.    Appearance: He is well-developed.  HENT:     Head: Normocephalic and atraumatic.   Eyes:  General: No scleral icterus.    Conjunctiva/sclera: Conjunctivae normal.     Pupils: Pupils are equal, round, and reactive to light.  Neck:     Vascular: No JVD.     Trachea: No tracheal deviation.  Cardiovascular:     Rate and Rhythm: Normal rate and regular rhythm.     Heart sounds: Normal heart sounds. No murmur heard. Pulmonary:     Effort: Pulmonary effort is normal. No tachypnea, accessory muscle usage or respiratory distress.     Breath sounds: No stridor. No wheezing, rhonchi or rales.  Abdominal:     Palpations: Abdomen is soft.  Musculoskeletal:        General: No tenderness.     Cervical back: Neck supple.  Lymphadenopathy:     Cervical: No cervical adenopathy.  Skin:    General: Skin is warm and dry.     Capillary Refill: Capillary refill takes less than 2 seconds.     Findings: No rash.  Neurological:     Mental Status: He is alert and oriented to person, place, and time.  Psychiatric:        Behavior: Behavior normal.     Vitals:   05/15/22 0929  BP: 112/60  Pulse: 81  Temp: 97.7 F (36.5 C)  TempSrc: Oral  SpO2: 97%  Weight: 223 lb 12.8 oz (101.5 kg)  Height: 5' 10"  (1.778 m)   97% on RA BMI Readings from Last 3 Encounters:  05/15/22 32.11 kg/m  04/21/22 32.37 kg/m  03/21/22 31.94 kg/m   Wt Readings from Last 3 Encounters:  05/15/22 223 lb 12.8 oz (101.5 kg)  04/21/22 225 lb 9.6 oz (102.3 kg)  03/21/22 229 lb (103.9 kg)     CBC    Component Value Date/Time   WBC 11.3 (H) 03/21/2022 0932   WBC 13.2 (H) 09/04/2018 1047   RBC 5.22 03/21/2022 0932   RBC 4.92 09/04/2018 1047   HGB 15.0 03/21/2022 0932   HCT 44.5 03/21/2022 0932   PLT 221 03/21/2022 0932   MCV 85 03/21/2022 0932   MCH 28.7 03/21/2022 0932   MCH 29.5 09/04/2018 1047   MCHC 33.7 03/21/2022 0932   MCHC 33.6 09/04/2018 1047   RDW 14.8 03/21/2022 0932   LYMPHSABS 3.9 (H) 03/21/2022 0932   MONOABS 0.5 08/15/2016 1314   EOSABS 0.2 03/21/2022 0932   BASOSABS  0.1 03/21/2022 0932    Chest Imaging:  May 2023 lung cancer screening CT: 11.8 mm enlarging right upper lobe pulmonary nodule concerning for malignancy. The patient's images have been independently reviewed by me.    Pulmonary Functions Testing Results:     View : No data to display.          FeNO:   Pathology:   Echocardiogram:   Heart Catheterization:     Assessment & Plan:     ICD-10-CM   1. Right upper lobe pulmonary nodule  R91.1 NM PET Image Initial (PI) Skull Base To Thigh (F-18 FDG)    Pulmonary Function Test    2. Tobacco use  Z72.0     3. Centrilobular emphysema ([place])  J43.2       Discussion:  This is a 71 year old gentleman with a right upper lobe pulmonary nodule found to have a nodule back on previous lung cancer screening CTs that has slowly enlarged over time.  Was 5 6 mm in diameter and now at 11 mm in his largest cross-section.  It is associated with some centrilobular emphysema and I do  believe places at high risk for the development of malignancy.  Plan: We talked today about lung cancer screening and smoking cessation. I think the next best step would be including a PET scan in our decision making as well as having pulmonary function test. Hopefully he can get these completed prior to next office visit. He is in a come back and see [place] in a few weeks after his PET and PFTs are complete so we can discuss next steps. I explained based on his previous coronary history and previous CABG and him still smoking a pack a day that he may not be a great candidate for surgical resection.  He agreed with that and is not sure that he would want to undergo a major thoracic procedure. Pending on his PET scan and PFT results we would consider tissue biopsy dye marking or fiducial marking for consideration for radiation treatments.  I will discuss his case with thoracic surgery.     Current Outpatient Medications:    apixaban (ELIQUIS) 5 MG TABS tablet, Take 1  tablet (5 mg total) by mouth 2 (two) times daily., Disp: 180 tablet, Rfl: 3   atorvastatin (LIPITOR) 10 MG tablet, Take 1 tablet by mouth once daily, Disp: 90 tablet, Rfl: 0   blood glucose meter kit and supplies KIT, Dispense based on patient and insurance preference., Disp: 1 each, Rfl: 0   Continuous Blood Gluc Receiver (FREESTYLE LIBRE 14 DAY READER) DEVI, 1 Device by Does not apply route daily. Use to check blood sugars every morning and 2 hours after largest meals, Disp: 1 each, Rfl: 1   Continuous Blood Gluc Sensor (FREESTYLE LIBRE 14 DAY SENSOR) MISC, USE TO CHECK BLOOD SUGARS IN THE MORNING FASTING AND  2  HOURS  AFTER  LARGEST  MEAL, Disp: 6 each, Rfl: 0   finasteride (PROSCAR) 5 MG tablet, Take 5 mg by mouth daily., Disp: , Rfl:    gabapentin (NEURONTIN) 300 MG capsule, TAKE 2 CAPSULES BY MOUTH IN THE MORNING AND 2 IN THE EVENING AT  DINNERTIME, Disp: 360 capsule, Rfl: 0   glucose blood (FREESTYLE LITE) test strip, Use to check blood sugars every morning fasting, Disp: 100 each, Rfl: 12   lisinopril (ZESTRIL) 5 MG tablet, Take 1 tablet by mouth once daily, Disp: 90 tablet, Rfl: 0   metFORMIN (GLUCOPHAGE-XR) 500 MG 24 hr tablet, Take 1 tablet (500 mg total) by mouth daily with breakfast., Disp: 90 tablet, Rfl: 0   mirabegron ER (MYRBETRIQ) 50 MG TB24 tablet, Take 1 tablet (50 mg total) by mouth daily., Disp: 30 tablet, Rfl: 11   Multiple Vitamin (MULTIVITAMIN WITH MINERALS) TABS tablet, Take 1 tablet by mouth daily. Men's One-A-Day Multivitamin, Disp: , Rfl:    oxybutynin (DITROPAN-XL) 5 MG 24 hr tablet, Take 5 mg by mouth at bedtime., Disp: , Rfl:    tamsulosin (FLOMAX) 0.4 MG CAPS capsule, Take 1 capsule by mouth once daily at bedtime, Disp: 90 capsule, Rfl: 1   WELLBUTRIN SR 150 MG 12 hr tablet, Take 1 tablet (150 mg total) by mouth 2 (two) times daily., Disp: 180 tablet, Rfl: 0   diclofenac Sodium (VOLTAREN) 1 % GEL, Apply 2 g topically 4 (four) times daily as needed. (Patient not  taking: Reported on 04/21/2022), Disp: 50 g, Rfl: 0   FARXIGA 10 MG TABS tablet, TAKE 1 TABLET BY MOUTH ONCE DAILY BEFORE BREAKFAST, Disp: 90 tablet, Rfl: 0   furosemide (LASIX) 20 MG tablet, Take 1 tablet (20 mg total) by  mouth daily as needed for edema. (Patient not taking: Reported on 04/21/2022), Disp: 30 tablet, Rfl: 0   methocarbamol (ROBAXIN) 500 MG tablet, Take 1 tablet (500 mg total) by mouth every 8 (eight) hours as needed for muscle spasms. (Patient not taking: Reported on 04/21/2022), Disp: 20 tablet, Rfl: 0   mupirocin cream (BACTROBAN) 2 %, Apply 1 application topically 2 (two) times daily. (Patient not taking: Reported on 04/21/2022), Disp: 15 g, Rfl: 0  I spent 63 minutes dedicated to the care of this patient on the date of this encounter to include pre-visit review of records, face-to-face time with the patient discussing conditions above, post visit ordering of testing, clinical documentation with the electronic health record, making appropriate referrals as documented, and communicating necessary findings to members of the patients care team.   Garner Nash, DO [facility] 05/15/2022 11:55 AM

## 2022-05-15 NOTE — Patient Instructions (Addendum)
Thank you for visiting Dr. Valeta Harms at [facility]. Today we recommend the following:  Orders Placed This Encounter  Procedures   NM PET Image Initial (PI) Skull Base To Thigh (F-18 FDG)   Pulmonary Function Test   See [place] after PFTs and PET   Return in about 3 weeks (around 06/05/2022) for with Eric Form, NP, or Dr. Valeta Harms.    Please do your part to reduce the spread of COVID-19.

## 2022-05-17 ENCOUNTER — Other Ambulatory Visit: Payer: Self-pay | Admitting: Physician Assistant

## 2022-05-18 ENCOUNTER — Encounter (HOSPITAL_COMMUNITY)
Admission: RE | Admit: 2022-05-18 | Discharge: 2022-05-18 | Disposition: A | Discharge status: Home / Self Care | Payer: PPO | Source: Ambulatory Visit | Attending: Pulmonary Disease | Admitting: Pulmonary Disease

## 2022-05-18 DIAGNOSIS — Z8546 Personal history of malignant neoplasm of prostate: Secondary | ICD-10-CM | POA: Diagnosis not present

## 2022-05-18 DIAGNOSIS — N200 Calculus of kidney: Secondary | ICD-10-CM | POA: Diagnosis not present

## 2022-05-18 DIAGNOSIS — R911 Solitary pulmonary nodule: Secondary | ICD-10-CM | POA: Diagnosis not present

## 2022-05-18 DIAGNOSIS — I2510 Atherosclerotic heart disease of native coronary artery without angina pectoris: Secondary | ICD-10-CM | POA: Diagnosis not present

## 2022-05-18 LAB — GLUCOSE, CAPILLARY: Glucose-Capillary: 213 mg/dL — ABNORMAL HIGH (ref 70–99)

## 2022-05-18 MED ORDER — FLUDEOXYGLUCOSE F - 18 (FDG) INJECTION
10.7600 | Freq: Once | INTRAVENOUS | Status: AC
  Administered 2022-05-18: 10.76 via INTRAVENOUS

## 2022-05-21 ENCOUNTER — Encounter: Payer: Self-pay | Admitting: Physician Assistant

## 2022-05-23 ENCOUNTER — Encounter (HOSPITAL_BASED_OUTPATIENT_CLINIC_OR_DEPARTMENT_OTHER): Payer: PPO | Admitting: General Surgery

## 2022-05-23 DIAGNOSIS — L97822 Non-pressure chronic ulcer of other part of left lower leg with fat layer exposed: Secondary | ICD-10-CM | POA: Diagnosis not present

## 2022-05-23 DIAGNOSIS — E11622 Type 2 diabetes mellitus with other skin ulcer: Secondary | ICD-10-CM | POA: Diagnosis not present

## 2022-05-23 DIAGNOSIS — I7389 Other specified peripheral vascular diseases: Secondary | ICD-10-CM | POA: Diagnosis not present

## 2022-05-23 DIAGNOSIS — I872 Venous insufficiency (chronic) (peripheral): Secondary | ICD-10-CM | POA: Diagnosis not present

## 2022-05-23 NOTE — Progress Notes (Addendum)
Marcus, [name] ([id number]) . Visit Report for 05/23/2022 Chief Complaint Document Details Patient Name: Date of Service: [name]. 05/23/2022 8:30 A M Medical Record Number: [id number] Patient Account Number: [id number] Date of Birth/Sex: Treating RN: [birth year] (71 y.o. [name] Primary Care Provider: [name] Other Clinician: Referring Provider: Treating Provider/Extender: Casandra Doffing in Treatment: 5 Information Obtained from: Patient Chief Complaint Patient seen for complaints of Non-Healing Wounds. Electronic Signature(s) Signed: 05/23/2022 8:44:22 AM By: Fredirick Maudlin MD FACS Entered By: Fredirick Maudlin on 05/23/2022 08:44:22 -------------------------------------------------------------------------------- Debridement Details Patient Name: Date of Service: [name], Marcus [name] 05/23/2022 8:30 A M Medical Record Number: [id number] Patient Account Number: [id number] Date of Birth/Sex: Treating RN: 30-Mar-1951 (71 y.o. [name] Primary Care Provider: [name] Other Clinician: Referring Provider: Treating Provider/Extender: Casandra Doffing in Treatment: 5 Debridement Performed for Assessment: Wound #3 Left,Anterior Lower Leg Performed By: Physician Fredirick Maudlin, MD Debridement Type: Debridement Severity of Tissue Pre Debridement: Fat layer exposed Level of Consciousness (Pre-procedure): Awake and Alert Pre-procedure Verification/Time Out Yes - 08:41 Taken: Start Time: 08:41 Pain Control: Other : Benzocaine 20% T Area Debrided (L x W): otal 3 (cm) x 1 (cm) = 3 (cm) Tissue and other material debrided: Slough, Subcutaneous, Slough Level: Skin/Subcutaneous Tissue Debridement Description: Excisional Instrument: Curette Bleeding: Minimum Hemostasis Achieved: Pressure End Time: 08:42 Procedural Pain: 0 Post Procedural Pain: 0 Response to Treatment: Procedure was tolerated  well Level of Consciousness (Post- Awake and Alert procedure): Post Debridement Measurements of Total Wound Length: (cm) 3 Width: (cm) 1 Depth: (cm) 0.1 Volume: (cm) 0.236 Character of Wound/Ulcer Post Debridement: Improved Severity of Tissue Post Debridement: Fat layer exposed Post Procedure Diagnosis Same as Pre-procedure Electronic Signature(s) Signed: 05/23/2022 10:54:03 AM By: Fredirick Maudlin MD FACS Signed: 05/23/2022 4:48:50 PM By: Dellie Catholic RN Entered By: Dellie Catholic on 05/23/2022 08:44:09 -------------------------------------------------------------------------------- Debridement Details Patient Name: Date of Service: [name], Marcus [name] 05/23/2022 8:30 A M Medical Record Number: [id number] Patient Account Number: [id number] Date of Birth/Sex: Treating RN: May 23, 1951 (71 y.o. [name] Primary Care Provider: [name] Other Clinician: Referring Provider: Treating Provider/Extender: Casandra Doffing in Treatment: 5 Debridement Performed for Assessment: Wound #4 Left,Lateral Lower Leg Performed By: Physician Fredirick Maudlin, MD Debridement Type: Debridement Severity of Tissue Pre Debridement: Fat layer exposed Level of Consciousness (Pre-procedure): Awake and Alert Pre-procedure Verification/Time Out Yes - 08:41 Taken: Start Time: 08:41 Pain Control: Other : Benzocaine 20% T Area Debrided (L x W): otal 0.9 (cm) x 0.9 (cm) = 0.81 (cm) Tissue and other material debrided: Non-Viable, Slough, Subcutaneous, Slough Level: Skin/Subcutaneous Tissue Debridement Description: Excisional Instrument: Curette Bleeding: Minimum Hemostasis Achieved: Pressure End Time: 08:42 Procedural Pain: 0 Post Procedural Pain: 0 Response to Treatment: Procedure was tolerated well Level of Consciousness (Post- Awake and Alert procedure): Post Debridement Measurements of Total Wound Length: (cm) 0.9 Width: (cm) 0.9 Depth: (cm)  0.1 Volume: (cm) 0.064 Character of Wound/Ulcer Post Debridement: Improved Severity of Tissue Post Debridement: Fat layer exposed Post Procedure Diagnosis Same as Pre-procedure Electronic Signature(s) Signed: 05/23/2022 10:54:03 AM By: Fredirick Maudlin MD FACS Signed: 05/23/2022 4:48:50 PM By: Dellie Catholic RN Entered By: Dellie Catholic on 05/23/2022 08:45:54 -------------------------------------------------------------------------------- HPI Details Patient Name: Date of Service: [name], Marcus [name] 05/23/2022 8:30 A M Medical Record Number: [id number] Patient Account Number: [id number] Date of Birth/Sex: Treating RN: 1951/05/28 (71 y.o. [name] Primary Care Provider:  [name] Other Clinician: Referring Provider: Treating Provider/Extender: Casandra Doffing in Treatment: 5 History of Present Illness HPI Description: 11/10/2020 patient presents today for initial evaluation in this clinic although I have seen this patient in [place] previous. Subsequently his issue today is different from when I saw him for I was treating him for a toe ulcer. Currently he is having issues with bilateral lower extremity ulcers after running into a metal bar. Upon inspection today he has wounds of the bilateral lower extremities which are consistent with having struck his legs on the anterior aspect. With that being said the patient did go to the ER for evaluation on October 11 where he was given Keflex initially. He went back to the ER after not improving on October 20 he was given Lasix at that point he states the Lasix seem to help the most. Has been using Neosporin and leaving this open area which is probably not the best way to go either to be honest. He has had arterial studies on March 2021 which showed a left ABI of 0.78 with a TBI of 0.34 and a right ABI of 1.18 with a TBI 1.18. Nonetheless his arterial flow the left is not ideal but also I think  potentially consistent with allowing this to heal. I think he can also support light compression with Kerlix and Coban based on this result. His most recent hemoglobin A1c that we could find was 7.3 and that was towards the end of 2020. The patient does have a history of heart disease unfortunately. He is also currently still a smoker. 11/24/2020 on evaluation today patient appears to be doing well with regard to his wounds. In fact both appear to be doing better after last week's rather last visit's debridement. Fortunately there is no signs of active infection at this time. No fevers, chills, nausea, vomiting, or diarrhea. Overall I am very pleased with where things stand today. 12/01/2020 on evaluation today patient appears to be doing well with regard to his leg ulcers. He has been tolerating the dressing changes without complication. Fortunately there is no signs of active infection at this time. No fevers, chills, nausea, vomiting, or diarrhea. 12/08/2020 on evaluation today patient appears to be doing very well in regard to his bilateral lower extremity ulcers. They seem to be doing excellent and he is making great progress. There does not appear to be any evidence of active infection at this time. No fevers, chills, nausea, vomiting, or diarrhea. 12/15/2020 upon evaluation today patient actually appears to be doing excellent in regard to his wounds. Has been tolerating the dressing changes without complication. Fortunately his wounds are showing signs of great improvement were using Hydrofera Blue. 12/29/2020 on evaluation today patient appears to be doing well with regard to his right leg which is completely healed. His left leg is also measuring smaller than not healed this is doing very well. Fortunately there is no signs of active infection at this time 01/05/2021 upon evaluation today patient actually appears to be making signs of improvement with regard to his left leg. I am very pleased with  how things are progressing. There is no evidence of active infection at this time. No fevers, chills, nausea, vomiting, or diarrhea. Patient is extremely happy with the fact that this is doing so well 01/12/2021 upon evaluation today patient appears to be doing excellent in regard to his leg ulcer. Fortunately there is no signs of active infection at this time. No fevers, chills,  nausea, vomiting, or diarrhea. He has been tolerating the dressing changes without complication. 01/19/2021 upon evaluation today patient appears to be doing well with regard to his wound. In fact this appears to be completely healed on the left leg. This is brand-new skin but overall seems to be doing quite well which is great news. No fevers, chills, nausea, vomiting, or diarrhea. READMISSION 04/17/2022 This is a patient has been seen on a number of occasions in the wound care center. He is 71 years old and has type 2 diabetes mellitus, coronary artery disease, chronic venous insufficiency, and peripheral arterial disease with recent vascular studies demonstrating a right TBI of 0.9 (normal) and a left great toe TBI of 0.33. 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 He recently dropped an object and it hit him in the left shin. He then bumped the same leg against his wagon. He now has 2 wounds on his left anterior lower leg. He says that he applied Aquaphor to keep the areas moist and made an appointment in the wound care center for follow-up. 04/24/2022: Today, he is here with what he describes as 10 out of 10 pain in his wounds. Both of them measured a little bit larger today, but this may be secondary to the debridement that was performed last week. He says that due to it being spring season, he is on his feet quite a bit more. The leg is red and warm. We have been using silver alginate with Kerlix and Coban wraps. 05/01/2022: PCR culture taken last week grew out MRSA. I prescribed doxycycline. He has been  taking this. Both wounds are a bit smaller today and somewhat less painful. They continue to accumulate a bit of slough. 05/08/2022: He has completed his course of oral doxycycline. We have been using mupirocin under silver alginate with Kerlix and Coban wrapping. Today, both wounds are a bit smaller but have accumulated slough on the surface. They are less tender. 05/23/2022: Both wounds are quite a bit more shallow today. There is a little bit of slough accumulation, but there is also good granulation tissue as well as some buds of epithelialized tissue coming through the wound. No surrounding erythema, induration, nor any purulent drainage. We have been using mupirocin with silver alginate and Kerlix/Coban wrapping. Electronic Signature(s) Signed: 05/23/2022 8:45:20 AM By: Fredirick Maudlin MD FACS Entered By: Fredirick Maudlin on 05/23/2022 08:45:20 -------------------------------------------------------------------------------- Physical Exam Details Patient Name: Date of Service: Marcus Milliner T. 05/23/2022 8:30 A M Medical Record Number: [id number] Patient Account Number: [id number] Date of Birth/Sex: Treating RN: 1951/09/27 (71 y.o. [name] Primary Care Provider: [name] Other Clinician: Referring Provider: Treating Provider/Extender: [name] Weeks in Treatment: 5 Constitutional . . . . No acute distress. Respiratory Normal work of breathing on room air. Notes 05/23/2022: Both wounds are quite a bit more shallow today. There is a little bit of slough accumulation, but there is also good granulation tissue as well as some buds of epithelialized tissue coming through the wound. No surrounding erythema, induration, nor any purulent drainage. Electronic Signature(s) Signed: 05/23/2022 8:46:07 AM By: Fredirick Maudlin MD FACS Entered By: Fredirick Maudlin on 05/23/2022  08:46:07 -------------------------------------------------------------------------------- Physician Orders Details Patient Name: Date of Service: [name], Marcus [name] 05/23/2022 8:30 A M Medical Record Number: [id number] Patient Account Number: [id number] Date of Birth/Sex: Treating RN: 1951/06/19 (71 y.o. [name] Primary Care Provider: [name] Other Clinician: Referring Provider: Treating Provider/Extender: Celine Ahr,  Bonnee Quin, Maritza Weeks in Treatment: 5 Verbal / Phone Orders: No Diagnosis Coding ICD-10 Coding Code Description [phone] Non-pressure chronic ulcer of other part of left lower leg with fat layer exposed E11.622 Type 2 diabetes mellitus with other skin ulcer I73.9 Peripheral vascular disease, unspecified I87.2 Venous insufficiency (chronic) (peripheral) Follow-up Appointments ppointment in 1 week. - Dr Celine Ahr - Room 2 Return A Bathing/ Shower/ Hygiene May shower with protection but do not get wound dressing(s) wet. - Please do not get Left Leg wet/damp. Edema Control - Lymphedema / SCD / Other Bilateral Lower Extremities Avoid standing for long periods of time. Exercise regularly Moisturize legs daily. Wound Treatment Wound #3 - Lower Leg Wound Laterality: Left, Anterior Cleanser: Soap and Water 1 x Per Day/30 Days Discharge Instructions: May shower and wash wound with dial antibacterial soap and water prior to dressing change. Cleanser: Wound Cleanser 1 x Per Day/30 Days Discharge Instructions: Cleanse the wound with wound cleanser prior to applying a clean dressing using gauze sponges, not tissue or cotton balls. Peri-Wound Care: Triamcinolone 15 (g) 1 x Per Day/30 Days Discharge Instructions: Use triamcinolone 15 (g) as directed Peri-Wound Care: Sween Lotion (Moisturizing lotion) 1 x Per Day/30 Days Discharge Instructions: Apply moisturizing lotion as directed Topical: Mupirocin Ointment 1 x Per Day/30 Days Discharge Instructions: Apply  Mupirocin (Bactroban) as instructed Prim Dressing: KerraCel Ag Gelling Fiber Dressing, 2x2 in (silver alginate) 1 x Per Day/30 Days ary Discharge Instructions: Apply silver alginate to wound bed as instructed Secondary Dressing: ABD Pad, 8x10 1 x Per Day/30 Days Discharge Instructions: Apply over primary dressing as directed. Secondary Dressing: Woven Gauze Sponge, Non-Sterile 4x4 in 1 x Per Day/30 Days Discharge Instructions: Apply over primary dressing as directed. Secured With: 22M Medipore Public affairs consultant Surgical T 2x10 (in/yd) 1 x Per Day/30 Days ape Discharge Instructions: Secure with tape as directed. Compression Wrap: Kerlix Roll 4.5x3.1 (in/yd) 1 x Per Day/30 Days Discharge Instructions: Apply Kerlix and Coban compression as directed. Compression Wrap: Coban Self-Adherent Wrap 4x5 (in/yd) 1 x Per Day/30 Days Discharge Instructions: Apply over Kerlix as directed. Wound #4 - Lower Leg Wound Laterality: Left, Lateral Cleanser: Soap and Water 1 x Per Day/30 Days Discharge Instructions: May shower and wash wound with dial antibacterial soap and water prior to dressing change. Cleanser: Wound Cleanser 1 x Per Day/30 Days Discharge Instructions: Cleanse the wound with wound cleanser prior to applying a clean dressing using gauze sponges, not tissue or cotton balls. Peri-Wound Care: Triamcinolone 15 (g) 1 x Per Day/30 Days Discharge Instructions: Use triamcinolone 15 (g) as directed Peri-Wound Care: Sween Lotion (Moisturizing lotion) 1 x Per Day/30 Days Discharge Instructions: Apply moisturizing lotion as directed Topical: Mupirocin Ointment 1 x Per Day/30 Days Discharge Instructions: Apply Mupirocin (Bactroban) as instructed Prim Dressing: KerraCel Ag Gelling Fiber Dressing, 2x2 in (silver alginate) 1 x Per Day/30 Days ary Discharge Instructions: Apply silver alginate to wound bed as instructed Secondary Dressing: ABD Pad, 8x10 1 x Per Day/30 Days Discharge Instructions: Apply over primary  dressing as directed. Secondary Dressing: Woven Gauze Sponge, Non-Sterile 4x4 in 1 x Per Day/30 Days Discharge Instructions: Apply over primary dressing as directed. Secured With: 22M Medipore Public affairs consultant Surgical T 2x10 (in/yd) 1 x Per Day/30 Days ape Discharge Instructions: Secure with tape as directed. Compression Wrap: Kerlix Roll 4.5x3.1 (in/yd) 1 x Per Day/30 Days Discharge Instructions: Apply Kerlix and Coban compression as directed. Compression Wrap: Coban Self-Adherent Wrap 4x5 (in/yd) 1 x Per Day/30 Days Discharge Instructions: Apply over  Kerlix as directed. Electronic Signature(s) Signed: 05/23/2022 10:54:03 AM By: Fredirick Maudlin MD FACS Signed: 05/23/2022 4:48:50 PM By: Dellie Catholic RN Previous Signature: 05/23/2022 8:46:14 AM Version By: Fredirick Maudlin MD FACS Entered By: Dellie Catholic on 05/23/2022 08:46:26 -------------------------------------------------------------------------------- Problem List Details Patient Name: Date of Service: [name], Marcus [name] 05/23/2022 8:30 A M Medical Record Number: [id number] Patient Account Number: [id number] Date of Birth/Sex: Treating RN: 03-Jan-1951 (71 y.o. [name] Primary Care Provider: [name] Other Clinician: Referring Provider: Treating Provider/Extender: [name] Weeks in Treatment: 5 Active Problems ICD-10 Encounter Code Description Active Date MDM Diagnosis [phone] Non-pressure chronic ulcer of other part of left lower leg with fat layer exposed4/24/2023 No Yes E11.622 Type 2 diabetes mellitus with other skin ulcer 04/17/2022 No Yes I73.9 Peripheral vascular disease, unspecified 04/17/2022 No Yes I87.2 Venous insufficiency (chronic) (peripheral) 04/17/2022 No Yes Inactive Problems Resolved Problems Electronic Signature(s) Signed: 05/23/2022 8:43:49 AM By: Fredirick Maudlin MD FACS Entered By: Fredirick Maudlin on 05/23/2022  08:43:49 -------------------------------------------------------------------------------- Progress Note Details Patient Name: Date of Service: [name], Marcus [name] 05/23/2022 8:30 A M Medical Record Number: [id number] Patient Account Number: [id number] Date of Birth/Sex: Treating RN: [birth year] (71 y.o. [name] Primary Care Provider: [name] Other Clinician: Referring Provider: Treating Provider/Extender: Casandra Doffing in Treatment: 5 Subjective Chief Complaint Information obtained from Patient Patient seen for complaints of Non-Healing Wounds. History of Present Illness (HPI) 11/10/2020 patient presents today for initial evaluation in this clinic although I have seen this patient in [place] previous. Subsequently his issue today is different from when I saw him for I was treating him for a toe ulcer. Currently he is having issues with bilateral lower extremity ulcers after running into a metal bar. Upon inspection today he has wounds of the bilateral lower extremities which are consistent with having struck his legs on the anterior aspect. With that being said the patient did go to the ER for evaluation on October 11 where he was given Keflex initially. He went back to the ER after not improving on October 20 he was given Lasix at that point he states the Lasix seem to help the most. Has been using Neosporin and leaving this open area which is probably not the best way to go either to be honest. He has had arterial studies on March 2021 which showed a left ABI of 0.78 with a TBI of 0.34 and a right ABI of 1.18 with a TBI 1.18. Nonetheless his arterial flow the left is not ideal but also I think potentially consistent with allowing this to heal. I think he can also support light compression with Kerlix and Coban based on this result. His most recent hemoglobin A1c that we could find was 7.3 and that was towards the end of 2020. The patient does  have a history of heart disease unfortunately. He is also currently still a smoker. 11/24/2020 on evaluation today patient appears to be doing well with regard to his wounds. In fact both appear to be doing better after last week's rather last visit's debridement. Fortunately there is no signs of active infection at this time. No fevers, chills, nausea, vomiting, or diarrhea. Overall I am very pleased with where things stand today. 12/01/2020 on evaluation today patient appears to be doing well with regard to his leg ulcers. He has been tolerating the dressing changes without complication. Fortunately there is no signs of active infection at this time. No fevers,  chills, nausea, vomiting, or diarrhea. 12/08/2020 on evaluation today patient appears to be doing very well in regard to his bilateral lower extremity ulcers. They seem to be doing excellent and he is making great progress. There does not appear to be any evidence of active infection at this time. No fevers, chills, nausea, vomiting, or diarrhea. 12/15/2020 upon evaluation today patient actually appears to be doing excellent in regard to his wounds. Has been tolerating the dressing changes without complication. Fortunately his wounds are showing signs of great improvement were using Hydrofera Blue. 12/29/2020 on evaluation today patient appears to be doing well with regard to his right leg which is completely healed. His left leg is also measuring smaller than not healed this is doing very well. Fortunately there is no signs of active infection at this time 01/05/2021 upon evaluation today patient actually appears to be making signs of improvement with regard to his left leg. I am very pleased with how things are progressing. There is no evidence of active infection at this time. No fevers, chills, nausea, vomiting, or diarrhea. Patient is extremely happy with the fact that this is doing so well 01/12/2021 upon evaluation today patient appears to  be doing excellent in regard to his leg ulcer. Fortunately there is no signs of active infection at this time. No fevers, chills, nausea, vomiting, or diarrhea. He has been tolerating the dressing changes without complication. 01/19/2021 upon evaluation today patient appears to be doing well with regard to his wound. In fact this appears to be completely healed on the left leg. This is brand-new skin but overall seems to be doing quite well which is great news. No fevers, chills, nausea, vomiting, or diarrhea. READMISSION 04/17/2022 This is a patient has been seen on a number of occasions in the wound care center. He is 71 years old and has type 2 diabetes mellitus, coronary artery disease, chronic venous insufficiency, and peripheral arterial disease with recent vascular studies demonstrating a right TBI of 0.9 (normal) and a left great toe TBI of 0.33. 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 He recently dropped an object and it hit him in the left shin. He then bumped the same leg against his wagon. He now has 2 wounds on his left anterior lower leg. He says that he applied Aquaphor to keep the areas moist and made an appointment in the wound care center for follow-up. 04/24/2022: Today, he is here with what he describes as 10 out of 10 pain in his wounds. Both of them measured a little bit larger today, but this may be secondary to the debridement that was performed last week. He says that due to it being spring season, he is on his feet quite a bit more. The leg is red and warm. We have been using silver alginate with Kerlix and Coban wraps. 05/01/2022: PCR culture taken last week grew out MRSA. I prescribed doxycycline. He has been taking this. Both wounds are a bit smaller today and somewhat less painful. They continue to accumulate a bit of slough. 05/08/2022: He has completed his course of oral doxycycline. We have been using mupirocin under silver alginate with Kerlix and Coban  wrapping. Today, both wounds are a bit smaller but have accumulated slough on the surface. They are less tender. 05/23/2022: Both wounds are quite a bit more shallow today. There is a little bit of slough accumulation, but there is also good granulation tissue as well as some buds of epithelialized tissue  coming through the wound. No surrounding erythema, induration, nor any purulent drainage. We have been using mupirocin with silver alginate and Kerlix/Coban wrapping. Patient History Information obtained from Patient. Social History Current every day smoker - 1 PPD, Marital Status - Married, Alcohol Use - Rarely, Drug Use - No History, Caffeine Use - Daily - coffee, soda. Medical History Eyes Patient has history of Cataracts - both eyes Cardiovascular Patient has history of Arrhythmia - A-fib, Coronary Artery Disease, Hypertension, Myocardial Infarction - CABG x 4, Peripheral Arterial Disease, Peripheral Venous Disease Endocrine Patient has history of Type II Diabetes Musculoskeletal Patient has history of Osteoarthritis Neurologic Patient has history of Neuropathy Oncologic Patient has history of Received Radiation Medical A Surgical History Notes nd Cardiovascular Hyperlipidemia, CVA, AAA Oncologic Prostate cancer Objective Constitutional No acute distress. Vitals Time Taken: 8:29 AM, Height: 70 in, Weight: 219 lbs, BMI: 31.4, Temperature: 97.9 F, Pulse: 81 bpm, Respiratory Rate: 16 breaths/min, Blood Pressure: 113/72 mmHg. Respiratory Normal work of breathing on room air. General Notes: 05/23/2022: Both wounds are quite a bit more shallow today. There is a little bit of slough accumulation, but there is also good granulation tissue as well as some buds of epithelialized tissue coming through the wound. No surrounding erythema, induration, nor any purulent drainage. Integumentary (Hair, Skin) Wound #3 status is Open. Original cause of wound was Trauma. The date acquired was:  04/10/2022. The wound has been in treatment 5 weeks. The wound is located on the Left,Anterior Lower Leg. The wound measures 3cm length x 1cm width x 0.1cm depth; 2.356cm^2 area and 0.236cm^3 volume. There is Fat Layer (Subcutaneous Tissue) exposed. There is no tunneling or undermining noted. There is a medium amount of serosanguineous drainage noted. The wound margin is flat and intact. There is medium (34-66%) red granulation within the wound bed. There is a medium (34-66%) amount of necrotic tissue within the wound bed including Adherent Slough. Wound #4 status is Open. Original cause of wound was Trauma. The date acquired was: 04/10/2022. The wound has been in treatment 5 weeks. The wound is located on the Left,Lateral Lower Leg. The wound measures 0.9cm length x 0.9cm width x 0.1cm depth; 0.636cm^2 area and 0.064cm^3 volume. There is Fat Layer (Subcutaneous Tissue) exposed. There is no tunneling or undermining noted. There is a medium amount of serosanguineous drainage noted. The wound margin is flat and intact. There is medium (34-66%) red granulation within the wound bed. There is a medium (34-66%) amount of necrotic tissue within the wound bed including Adherent Slough. Assessment Active Problems ICD-10 Non-pressure chronic ulcer of other part of left lower leg with fat layer exposed Type 2 diabetes mellitus with other skin ulcer Peripheral vascular disease, unspecified Venous insufficiency (chronic) (peripheral) Procedures Wound #3 Pre-procedure diagnosis of Wound #3 is a Diabetic Wound/Ulcer of the Lower Extremity located on the Left,Anterior Lower Leg .Severity of Tissue Pre Debridement is: Fat layer exposed. There was a Excisional Skin/Subcutaneous Tissue Debridement with a total area of 3 sq cm performed by Fredirick Maudlin, MD. With the following instrument(s): Curette Material removed includes Subcutaneous Tissue and Slough and after achieving pain control using Other (Benzocaine  20%). No specimens were taken. A time out was conducted at 08:41, prior to the start of the procedure. A Minimum amount of bleeding was controlled with Pressure. The procedure was tolerated well with a pain level of 0 throughout and a pain level of 0 following the procedure. Post Debridement Measurements: 3cm length x 1cm width x 0.1cm depth;  0.236cm^3 volume. Character of Wound/Ulcer Post Debridement is improved. Severity of Tissue Post Debridement is: Fat layer exposed. Post procedure Diagnosis Wound #3: Same as Pre-Procedure Wound #4 Pre-procedure diagnosis of Wound #4 is a Diabetic Wound/Ulcer of the Lower Extremity located on the Left,Lateral Lower Leg .Severity of Tissue Pre Debridement is: Fat layer exposed. There was a Excisional Skin/Subcutaneous Tissue Debridement with a total area of 0.81 sq cm performed by Fredirick Maudlin, MD. With the following instrument(s): Curette to remove Non-Viable tissue/material. Material removed includes Subcutaneous Tissue and Slough and after achieving pain control using Other (Benzocaine 20%). No specimens were taken. A time out was conducted at 08:41, prior to the start of the procedure. A Minimum amount of bleeding was controlled with Pressure. The procedure was tolerated well with a pain level of 0 throughout and a pain level of 0 following the procedure. Post Debridement Measurements: 0.9cm length x 0.9cm width x 0.1cm depth; 0.064cm^3 volume. Character of Wound/Ulcer Post Debridement is improved. Severity of Tissue Post Debridement is: Fat layer exposed. Post procedure Diagnosis Wound #4: Same as Pre-Procedure Plan 05/23/2022: Both wounds are quite a bit more shallow today. There is a little bit of slough accumulation, but there is also good granulation tissue as well as some buds of epithelialized tissue coming through the wound. No surrounding erythema, induration, nor any purulent drainage. I used a curette to debride slough and subcutaneous tissue  from each of the wounds. We will continue using topical mupirocin with silver alginate and Kerlix/Coban wrap. Follow-up in 1 week. Electronic Signature(s) Signed: 05/23/2022 8:46:46 AM By: Fredirick Maudlin MD FACS Entered By: Fredirick Maudlin on 05/23/2022 08:46:46 -------------------------------------------------------------------------------- HxROS Details Patient Name: Date of Service: [name], Marcus [name] 05/23/2022 8:30 A M Medical Record Number: [id number] Patient Account Number: [id number] Date of Birth/Sex: Treating RN: 01-Aug-1951 (71 y.o. [name] Primary Care Provider: [name] Other Clinician: Referring Provider: Treating Provider/Extender: Casandra Doffing in Treatment: 5 Information Obtained From Patient Eyes Medical History: Positive for: Cataracts - both eyes Cardiovascular Medical History: Positive for: Arrhythmia - A-fib; Coronary Artery Disease; Hypertension; Myocardial Infarction - CABG x 4; Peripheral Arterial Disease; Peripheral Venous Disease Past Medical History Notes: Hyperlipidemia, CVA, AAA Endocrine Medical History: Positive for: Type II Diabetes Time with diabetes: over 5 years Treated with: Oral agents Blood sugar tested every day: No Musculoskeletal Medical History: Positive for: Osteoarthritis Neurologic Medical History: Positive for: Neuropathy Oncologic Medical History: Positive for: Received Radiation Past Medical History Notes: Prostate cancer HBO Extended History Items Eyes: Cataracts Immunizations Pneumococcal Vaccine: Received Pneumococcal Vaccination: Yes Received Pneumococcal Vaccination On or After 60th Birthday: Yes Implantable Devices None Family and Social History Current every day smoker - 1 PPD; Marital Status - Married; Alcohol Use: Rarely; Drug Use: No History; Caffeine Use: Daily - coffee, soda; Financial Concerns: No; Food, Clothing or Shelter Needs: No; Support System  Lacking: No; Transportation Concerns: No Electronic Signature(s) Signed: 05/23/2022 10:54:03 AM By: Fredirick Maudlin MD FACS Signed: 05/23/2022 4:02:28 PM By: Adline Peals Entered By: Fredirick Maudlin on 05/23/2022 08:45:26 -------------------------------------------------------------------------------- SuperBill Details Patient Name: Date of Service: Marcus [employer] T. 05/23/2022 Medical Record Number: [id number] Patient Account Number: [id number] Date of Birth/Sex: Treating RN: 1951/04/04 (71 y.o. [name] Primary Care Provider: [name] Other Clinician: Referring Provider: Treating Provider/Extender: [name] Weeks in Treatment: 5 Diagnosis Coding ICD-10 Codes Code Description [phone] Non-pressure chronic ulcer of other part of left lower leg with fat layer exposed  E11.622 Type 2 diabetes mellitus with other skin ulcer I73.9 Peripheral vascular disease, unspecified I87.2 Venous insufficiency (chronic) (peripheral) Facility Procedures CPT4 Code: [id number] Description: 24580 - DEB SUBQ TISSUE 20 SQ CM/< ICD-10 Diagnosis Description L97.822 Non-pressure chronic ulcer of other part of left lower leg with fat layer expo Modifier: sed Quantity: 1 Physician Procedures : CPT4 Code Description Modifier [id number] - WC PHYS LEVEL 3 - EST PT 25 ICD-10 Diagnosis Description L97.822 Non-pressure chronic ulcer of other part of left lower leg with fat layer exposed E11.622 Type 2 diabetes mellitus with other skin ulcer  I73.9 Peripheral vascular disease, unspecified I87.2 Venous insufficiency (chronic) (peripheral) Quantity: 1 : [id number] 11042 - WC PHYS SUBQ TISS 20 SQ CM ICD-10 Diagnosis Description L97.822 Non-pressure chronic ulcer of other part of left lower leg with fat layer exposed Quantity: 1 Electronic Signature(s) Signed: 05/23/2022 8:47:03 AM By: Fredirick Maudlin MD FACS Entered By: Fredirick Maudlin on 05/23/2022 08:47:03

## 2022-05-23 NOTE — Progress Notes (Signed)
JAXSEN, BERNHART ([id number]) . Visit Report for 05/23/2022 Arrival Information Details Patient Name: Date of Service: [name]. 05/23/2022 8:30 A M Medical Record Number: [id number] Patient Account Number: [id number] Date of Birth/Sex: Treating RN: October 25, 1951 (71 y.o. [name] Primary Care Mishawn Hemann: [name] Other Clinician: Referring Arjay Jaskiewicz: Treating Johnpaul Gillentine/Extender: Casandra Doffing in Treatment: 5 Visit Information History Since Last Visit Added or deleted any medications: No Patient Arrived: Ambulatory Any new allergies or adverse reactions: No Arrival Time: 08:28 Had a fall or experienced change in No Accompanied By: self activities of daily living that may affect Transfer Assistance: None risk of falls: Patient Identification Verified: Yes Signs or symptoms of abuse/neglect since last visito No Patient Requires Transmission-Based Precautions: No Hospitalized since last visit: No Patient Has Alerts: Yes Implantable device outside of the clinic excluding No Patient Alerts: Patient on Blood Thinner cellular tissue based products placed in the center ABI R 1.11 since last visit: ABI L 0.72 Has Dressing in Place as Prescribed: Yes TBI L 0.33 Has Compression in Place as Prescribed: Yes Pain Present Now: No Electronic Signature(s) Signed: 05/23/2022 4:48:50 PM By: Dellie Catholic RN Entered By: Dellie Catholic on 05/23/2022 08:29:11 -------------------------------------------------------------------------------- Encounter Discharge Information Details Patient Name: Date of Service: [name], [name] 05/23/2022 8:30 A M Medical Record Number: [id number] Patient Account Number: [id number] Date of Birth/Sex: Treating RN: 1951-10-29 (71 y.o. [name] Primary Care Borden Thune: [name] Other Clinician: Referring Talyah Seder: Treating Jany Buckwalter/Extender: Casandra Doffing in Treatment:  5 Encounter Discharge Information Items Post Procedure Vitals Discharge Condition: Stable Temperature (F): 97.9 Ambulatory Status: Ambulatory Pulse (bpm): 81 Discharge Destination: Home Respiratory Rate (breaths/min): 16 Transportation: Private Auto Blood Pressure (mmHg): 113/72 Accompanied By: self Schedule Follow-up Appointment: Yes Clinical Summary of Care: Patient Declined Electronic Signature(s) Signed: 05/23/2022 4:48:50 PM By: Dellie Catholic RN Entered By: Dellie Catholic on 05/23/2022 08:49:38 -------------------------------------------------------------------------------- Lower Extremity Assessment Details Patient Name: Date of Service: [name] 05/23/2022 8:30 A M Medical Record Number: [id number] Patient Account Number: [id number] Date of Birth/Sex: Treating RN: 02-03-1951 (71 y.o. [name] Primary Care Alvera Tourigny: [name] Other Clinician: Referring Levern Kalka: Treating Gearldine Looney/Extender: [name] Weeks in Treatment: 5 Edema Assessment Assessed: [Left: No] [Right: No] E[Left: dema] [Right: :] Calf Left: Right: Point of Measurement: 34 cm From Medial Instep 36.5 cm Ankle Left: Right: Point of Measurement: 12 cm From Medial Instep 24 cm Electronic Signature(s) Signed: 05/23/2022 4:48:50 PM By: Dellie Catholic RN Entered By: Dellie Catholic on 05/23/2022 08:31:29 -------------------------------------------------------------------------------- Multi Wound Chart Details Patient Name: Date of Service: [name], [name] 05/23/2022 8:30 A M Medical Record Number: [id number] Patient Account Number: [id number] Date of Birth/Sex: Treating RN: November 28, 1951 (71 y.o. [name] Primary Care Endya Austin: [name] Other Clinician: Referring Mekesha Solomon: Treating Jeffree Cazeau/Extender: [name] Weeks in Treatment: 5 Vital Signs Height(in): 70 Pulse(bpm): 48 Weight(lbs): 219 Blood  Pressure(mmHg): 113/72 Body Mass Index(BMI): 31.4 Temperature(F): 97.9 Respiratory Rate(breaths/min): 16 Photos: [N/A:N/A] Left, Anterior Lower Leg Left, Lateral Lower Leg N/A Wound Location: Trauma Trauma N/A Wounding Event: Diabetic Wound/Ulcer of the Lower Diabetic Wound/Ulcer of the Lower N/A Primary Etiology: Extremity Extremity Cataracts, Arrhythmia, Coronary Cataracts, Arrhythmia, Coronary N/A Comorbid History: Artery Disease, Hypertension, Artery Disease, Hypertension, Myocardial Infarction, Peripheral Myocardial Infarction, Peripheral Arterial Disease, Peripheral Venous Arterial Disease, Peripheral Venous Disease, Type II Diabetes, Disease, Type II Diabetes, Osteoarthritis, Neuropathy, Received Osteoarthritis, Neuropathy, Received Radiation  Radiation 04/10/2022 04/10/2022 N/A Date Acquired: 5 5 N/A Weeks of Treatment: Open Open N/A Wound Status: No No N/A Wound Recurrence: 3x1x0.1 0.9x0.9x0.1 N/A Measurements L x W x D (cm) 2.356 0.636 N/A A (cm) : rea 0.236 0.064 N/A Volume (cm) : -11.10% -92.70% N/A % Reduction in A rea: -11.30% -93.90% N/A % Reduction in Volume: Grade 1 Grade 1 N/A Classification: Medium Medium N/A Exudate A mount: Serosanguineous Serosanguineous N/A Exudate Type: red, brown red, brown N/A Exudate Color: Flat and Intact Flat and Intact N/A Wound Margin: Medium (34-66%) Medium (34-66%) N/A Granulation A mount: Red Red N/A Granulation Quality: Medium (34-66%) Medium (34-66%) N/A Necrotic A mount: Fat Layer (Subcutaneous Tissue): Yes Fat Layer (Subcutaneous Tissue): Yes N/A Exposed Structures: Fascia: No Fascia: No Tendon: No Tendon: No Muscle: No Muscle: No Joint: No Joint: No Bone: No Bone: No Medium (34-66%) Medium (34-66%) N/A Epithelialization: Debridement - Excisional Debridement - Excisional N/A Debridement: Pre-procedure Verification/Time Out 08:41 08:41 N/A Taken: Other Other N/A Pain  Control: Subcutaneous, Slough Subcutaneous, Slough N/A Tissue Debrided: Skin/Subcutaneous Tissue Skin/Subcutaneous Tissue N/A Level: 3 0.81 N/A Debridement A (sq cm): rea Curette Curette N/A Instrument: Minimum Minimum N/A Bleeding: Pressure Pressure N/A Hemostasis A chieved: 0 0 N/A Procedural Pain: 0 0 N/A Post Procedural Pain: Procedure was tolerated well Procedure was tolerated well N/A Debridement Treatment Response: 3x1x0.1 0.9x0.9x0.1 N/A Post Debridement Measurements L x W x D (cm) 0.236 0.064 N/A Post Debridement Volume: (cm) Debridement N/A N/A Procedures Performed: Treatment Notes Electronic Signature(s) Signed: 05/23/2022 8:44:12 AM By: Fredirick Maudlin MD FACS Signed: 05/23/2022 4:02:28 PM By: Adline Peals Entered By: Fredirick Maudlin on 05/23/2022 08:44:11 -------------------------------------------------------------------------------- Multi-Disciplinary Care Plan Details Patient Name: Date of Service: [name], [name] 05/23/2022 8:30 A M Medical Record Number: [id number] Patient Account Number: [id number] Date of Birth/Sex: Treating RN: 06/26/1951 (71 y.o. [name] Primary Care Ticia Virgo: [name] Other Clinician: Referring Ronne Savoia: Treating Elleah Hemsley/Extender: [name] Weeks in Treatment: 5 Active Inactive Wound/Skin Impairment Nursing Diagnoses: Impaired tissue integrity Goals: Patient/caregiver will verbalize understanding of skin care regimen Date Initiated: 04/17/2022 Target Resolution Date: 06/23/2022 Goal Status: Active Interventions: Assess ulceration(s) every visit Treatment Activities: Skin care regimen initiated : 04/17/2022 Notes: Electronic Signature(s) Signed: 05/23/2022 4:48:50 PM By: Dellie Catholic RN Entered By: Dellie Catholic on 05/23/2022 08:46:44 -------------------------------------------------------------------------------- Pain Assessment Details Patient Name: Date of  Service: [name] 05/23/2022 8:30 A M Medical Record Number: [id number] Patient Account Number: [id number] Date of Birth/Sex: Treating RN: 1951-09-11 (71 y.o. [name] Primary Care Clell Trahan: [name] Other Clinician: Referring Kire Ferg: Treating Alexandria Shiflett/Extender: [name] Weeks in Treatment: 5 Active Problems Location of Pain Severity and Description of Pain Patient Has Paino No Site Locations Pain Management and Medication Current Pain Management: Electronic Signature(s) Signed: 05/23/2022 4:48:50 PM By: Dellie Catholic RN Entered By: Dellie Catholic on 05/23/2022 08:30:29 -------------------------------------------------------------------------------- Patient/Caregiver Education Details Patient Name: Date of Service: [name] 5/30/2023andnbsp8:30 A M Medical Record Number: [id number] Patient Account Number: [id number] Date of Birth/Gender: Treating RN: 09-24-1951 (71 y.o. [name] Primary Care Physician: [name] Other Clinician: Referring Physician: Treating Physician/Extender: Casandra Doffing in Treatment: 5 Education Assessment Education Provided To: Patient Education Topics Provided Wound/Skin Impairment: Methods: Explain/Verbal Responses: Return demonstration correctly Electronic Signature(s) Signed: 05/23/2022 4:48:50 PM By: Dellie Catholic RN Entered By: Dellie Catholic on 05/23/2022 08:47:57 -------------------------------------------------------------------------------- Wound Assessment Details Patient Name: Date of Service: MO O DY,  Lyman Bishop M T. 05/23/2022 8:30 A M Medical Record Number: [id number] Patient Account Number: [id number] Date of Birth/Sex: Treating RN: 26-Jun-1951 (71 y.o. [name] Primary Care Shaarav Ripple: [name] Other Clinician: Referring Eldrick Penick: Treating Chance Munter/Extender: [name] Weeks in  Treatment: 5 Wound Status Wound Number: 3 Primary Diabetic Wound/Ulcer of the Lower Extremity Etiology: Wound Location: Left, Anterior Lower Leg Wound Open Wounding Event: Trauma Status: Date Acquired: 04/10/2022 Comorbid Cataracts, Arrhythmia, Coronary Artery Disease, Hypertension, Weeks Of Treatment: 5 History: Myocardial Infarction, Peripheral Arterial Disease, Peripheral Venous Clustered Wound: No Disease, Type II Diabetes, Osteoarthritis, Neuropathy, Received Radiation Photos Wound Measurements Length: (cm) 3 Width: (cm) 1 Depth: (cm) 0.1 Area: (cm) 2.356 Volume: (cm) 0.236 % Reduction in Area: -11.1% % Reduction in Volume: -11.3% Epithelialization: Medium (34-66%) Tunneling: No Undermining: No Wound Description Classification: Grade 1 Wound Margin: Flat and Intact Exudate Amount: Medium Exudate Type: Serosanguineous Exudate Color: red, brown Wound Bed Granulation Amount: Medium (34-66%) Granulation Quality: Red Necrotic Amount: Medium (34-66%) Necrotic Quality: Adherent Slough Foul Odor After Cleansing: No Slough/Fibrino Yes Exposed Structure Fascia Exposed: No Fat Layer (Subcutaneous Tissue) Exposed: Yes Tendon Exposed: No Muscle Exposed: No Joint Exposed: No Bone Exposed: No Treatment Notes Wound #3 (Lower Leg) Wound Laterality: Left, Anterior Cleanser Soap and Water Discharge Instruction: May shower and wash wound with dial antibacterial soap and water prior to dressing change. Wound Cleanser Discharge Instruction: Cleanse the wound with wound cleanser prior to applying a clean dressing using gauze sponges, not tissue or cotton balls. Peri-Wound Care Triamcinolone 15 (g) Discharge Instruction: Use triamcinolone 15 (g) as directed Sween Lotion (Moisturizing lotion) Discharge Instruction: Apply moisturizing lotion as directed Topical Mupirocin Ointment Discharge Instruction: Apply Mupirocin (Bactroban) as instructed Primary Dressing KerraCel Ag  Gelling Fiber Dressing, 2x2 in (silver alginate) Discharge Instruction: Apply silver alginate to wound bed as instructed Secondary Dressing ABD Pad, 8x10 Discharge Instruction: Apply over primary dressing as directed. Woven Gauze Sponge, Non-Sterile 4x4 in Discharge Instruction: Apply over primary dressing as directed. Secured With [employer] Surgical T 2x10 (in/yd) ape Discharge Instruction: Secure with tape as directed. Compression Wrap Kerlix Roll 4.5x3.1 (in/yd) Discharge Instruction: Apply Kerlix and Coban compression as directed. Coban Self-Adherent Wrap 4x5 (in/yd) Discharge Instruction: Apply over Kerlix as directed. Compression Stockings Add-Ons Electronic Signature(s) Signed: 05/23/2022 4:02:28 PM By: Adline Peals Signed: 05/23/2022 4:48:50 PM By: Dellie Catholic RN Entered By: Dellie Catholic on 05/23/2022 08:33:31 -------------------------------------------------------------------------------- Wound Assessment Details Patient Name: Date of Service: [name], [name] 05/23/2022 8:30 A M Medical Record Number: [id number] Patient Account Number: [id number] Date of Birth/Sex: Treating RN: Mar 22, 1951 (71 y.o. [name] Primary Care Seven [place]: [name] Other Clinician: Referring Nyari Olsson: Treating Ky Rumple/Extender: [name] Weeks in Treatment: 5 Wound Status Wound Number: 4 Primary Diabetic Wound/Ulcer of the Lower Extremity Etiology: Wound Location: Left, Lateral Lower Leg Wound Open Wounding Event: Trauma Status: Date Acquired: 04/10/2022 Comorbid Cataracts, Arrhythmia, Coronary Artery Disease, Hypertension, Weeks Of Treatment: 5 History: Myocardial Infarction, Peripheral Arterial Disease, Peripheral Venous Clustered Wound: No Disease, Type II Diabetes, Osteoarthritis, Neuropathy, Received Radiation Photos Wound Measurements Length: (cm) 0.9 Width: (cm) 0.9 Depth: (cm) 0.1 Area: (cm)  0.636 Volume: (cm) 0.064 % Reduction in Area: -92.7% % Reduction in Volume: -93.9% Epithelialization: Medium (34-66%) Tunneling: No Undermining: No Wound Description Classification: Grade 1 Wound Margin: Flat and Intact Exudate Amount: Medium Exudate Type: Serosanguineous Exudate Color: red, brown Foul Odor After Cleansing: No Slough/Fibrino Yes Wound Bed Granulation Amount: Medium (34-66%)  Exposed Structure Granulation Quality: Red Fascia Exposed: No Necrotic Amount: Medium (34-66%) Fat Layer (Subcutaneous Tissue) Exposed: Yes Necrotic Quality: Adherent Slough Tendon Exposed: No Muscle Exposed: No Joint Exposed: No Bone Exposed: No Treatment Notes Wound #4 (Lower Leg) Wound Laterality: Left, Lateral Cleanser Soap and Water Discharge Instruction: May shower and wash wound with dial antibacterial soap and water prior to dressing change. Wound Cleanser Discharge Instruction: Cleanse the wound with wound cleanser prior to applying a clean dressing using gauze sponges, not tissue or cotton balls. Peri-Wound Care Triamcinolone 15 (g) Discharge Instruction: Use triamcinolone 15 (g) as directed Sween Lotion (Moisturizing lotion) Discharge Instruction: Apply moisturizing lotion as directed Topical Mupirocin Ointment Discharge Instruction: Apply Mupirocin (Bactroban) as instructed Primary Dressing KerraCel Ag Gelling Fiber Dressing, 2x2 in (silver alginate) Discharge Instruction: Apply silver alginate to wound bed as instructed Secondary Dressing ABD Pad, 8x10 Discharge Instruction: Apply over primary dressing as directed. Woven Gauze Sponge, Non-Sterile 4x4 in Discharge Instruction: Apply over primary dressing as directed. Secured With [employer] Surgical T 2x10 (in/yd) ape Discharge Instruction: Secure with tape as directed. Compression Wrap Kerlix Roll 4.5x3.1 (in/yd) Discharge Instruction: Apply Kerlix and Coban compression as directed. Coban  Self-Adherent Wrap 4x5 (in/yd) Discharge Instruction: Apply over Kerlix as directed. Compression Stockings Add-Ons Electronic Signature(s) Signed: 05/23/2022 4:02:28 PM By: Adline Peals Signed: 05/23/2022 4:48:50 PM By: Dellie Catholic RN Entered By: Dellie Catholic on 05/23/2022 08:34:10 -------------------------------------------------------------------------------- Vitals Details Patient Name: Date of Service: [name], [name] 05/23/2022 8:30 A M Medical Record Number: [id number] Patient Account Number: [id number] Date of Birth/Sex: Treating RN: 1951-04-01 (71 y.o. [name] Primary Care Maeva Dant: [name] Other Clinician: Referring Evaluna Utke: Treating Hudsyn Barich/Extender: Casandra Doffing in Treatment: 5 Vital Signs Time Taken: 08:29 Temperature (F): 97.9 Height (in): 70 Pulse (bpm): 81 Weight (lbs): 219 Respiratory Rate (breaths/min): 16 Body Mass Index (BMI): 31.4 Blood Pressure (mmHg): 113/72 Reference Range: 80 - 120 mg / dl Electronic Signature(s) Signed: 05/23/2022 4:48:50 PM By: Dellie Catholic RN Entered By: Dellie Catholic on 05/23/2022 08:29:38

## 2022-05-25 ENCOUNTER — Ambulatory Visit (INDEPENDENT_AMBULATORY_CARE_PROVIDER_SITE_OTHER): Payer: PPO | Admitting: Pulmonary Disease

## 2022-05-25 DIAGNOSIS — R911 Solitary pulmonary nodule: Secondary | ICD-10-CM

## 2022-05-25 LAB — PULMONARY FUNCTION TEST
DL/VA % pred: 70 %
DL/VA: 2.88 ml/min/mmHg/L
DLCO cor % pred: 68 %
DLCO cor: 16.8 ml/min/mmHg
DLCO unc % pred: 68 %
DLCO unc: 16.8 ml/min/mmHg
FEF 25-75 Post: 2.64 L/s
FEF 25-75 Pre: 1.64 L/s
FEF2575-%Change-Post: 61 %
FEF2575-%Pred-Post: 115 %
FEF2575-%Pred-Pre: 71 %
FEV1-%Change-Post: 9 %
FEV1-%Pred-Post: 87 %
FEV1-%Pred-Pre: 80 %
FEV1-Post: 2.64 L
FEV1-Pre: 2.41 L
FEV1FVC-%Change-Post: 4 %
FEV1FVC-%Pred-Pre: 99 %
FEV6-%Change-Post: 4 %
FEV6-%Pred-Post: 88 %
FEV6-%Pred-Pre: 84 %
FEV6-Post: 3.4 L
FEV6-Pre: 3.24 L
FEV6FVC-%Change-Post: 0 %
FEV6FVC-%Pred-Post: 104 %
FEV6FVC-%Pred-Pre: 104 %
FVC-%Change-Post: 5 %
FVC-%Pred-Post: 84 %
FVC-%Pred-Pre: 80 %
FVC-Post: 3.45 L
FVC-Pre: 3.28 L
Post FEV1/FVC ratio: 77 %
Post FEV6/FVC ratio: 99 %
Pre FEV1/FVC ratio: 73 %
Pre FEV6/FVC Ratio: 99 %
RV % pred: 113 %
RV: 2.65 L
TLC % pred: 92 %
TLC: 6.15 L

## 2022-05-25 NOTE — Progress Notes (Signed)
PFT done today. 

## 2022-05-29 ENCOUNTER — Encounter (HOSPITAL_BASED_OUTPATIENT_CLINIC_OR_DEPARTMENT_OTHER): Payer: PPO | Attending: General Surgery | Admitting: General Surgery

## 2022-05-29 DIAGNOSIS — I2510 Atherosclerotic heart disease of native coronary artery without angina pectoris: Secondary | ICD-10-CM | POA: Diagnosis not present

## 2022-05-29 DIAGNOSIS — I7389 Other specified peripheral vascular diseases: Secondary | ICD-10-CM | POA: Diagnosis not present

## 2022-05-29 DIAGNOSIS — I70248 Atherosclerosis of native arteries of left leg with ulceration of other part of lower left leg: Secondary | ICD-10-CM | POA: Insufficient documentation

## 2022-05-29 DIAGNOSIS — I872 Venous insufficiency (chronic) (peripheral): Secondary | ICD-10-CM | POA: Diagnosis not present

## 2022-05-29 DIAGNOSIS — E1136 Type 2 diabetes mellitus with diabetic cataract: Secondary | ICD-10-CM | POA: Insufficient documentation

## 2022-05-29 DIAGNOSIS — Z8614 Personal history of Methicillin resistant Staphylococcus aureus infection: Secondary | ICD-10-CM | POA: Insufficient documentation

## 2022-05-29 DIAGNOSIS — E11622 Type 2 diabetes mellitus with other skin ulcer: Secondary | ICD-10-CM | POA: Diagnosis not present

## 2022-05-29 DIAGNOSIS — F17210 Nicotine dependence, cigarettes, uncomplicated: Secondary | ICD-10-CM | POA: Diagnosis not present

## 2022-05-29 DIAGNOSIS — L97822 Non-pressure chronic ulcer of other part of left lower leg with fat layer exposed: Secondary | ICD-10-CM | POA: Diagnosis not present

## 2022-05-30 ENCOUNTER — Telehealth: Payer: Self-pay | Admitting: *Deleted

## 2022-05-30 NOTE — Telephone Encounter (Signed)
FYI--Patient returned call and I informed him the Eliquis Pt Assistance form is missing a signature, per RN. Patient plans to come by the office in the morning to complete paperwork, 6/07.

## 2022-05-30 NOTE — Telephone Encounter (Signed)
Left a message for the patient to call back. He dropped of his eliquis assistance forms but did not sign the signature page. We will need that signed before we can submit it.

## 2022-05-30 NOTE — Progress Notes (Signed)
CARLTON, BUSKEY ([id number]) . Visit Report for 05/29/2022 Arrival Information Details Patient Name: Date of Service: [name]. 05/29/2022 8:30 A M Medical Record Number: [id number] Patient Account Number: [id number] Date of Birth/Sex: Treating RN: 04-15-1951 (71 y.o. [name], [name] Primary Care Afnan Emberton: [name] Other Clinician: Referring Rannie Craney: Treating Chaim Gatley/Extender: Casandra Doffing in Treatment: 6 Visit Information History Since Last Visit Added or deleted any medications: No Patient Arrived: Ambulatory Any new allergies or adverse reactions: No Arrival Time: 08:12 Had a fall or experienced change in No Accompanied By: self activities of daily living that may affect Transfer Assistance: None risk of falls: Patient Identification Verified: Yes Signs or symptoms of abuse/neglect since last visito No Secondary Verification Process Completed: Yes Hospitalized since last visit: No Patient Requires Transmission-Based Precautions: No Implantable device outside of the clinic excluding No Patient Has Alerts: Yes cellular tissue based products placed in the center Patient Alerts: Patient on Blood Thinner since last visit: ABI R 1.11 Has Dressing in Place as Prescribed: Yes ABI L 0.72 Has Compression in Place as Prescribed: Yes TBI L 0.33 Pain Present Now: No Electronic Signature(s) Signed: 05/29/2022 3:50:54 PM By: Deon Pilling RN, BSN Entered By: Deon Pilling on 05/29/2022 08:13:52 -------------------------------------------------------------------------------- Encounter Discharge Information Details Patient Name: Date of Service: [name], Marcus [name] 05/29/2022 8:30 A M Medical Record Number: [id number] Patient Account Number: [id number] Date of Birth/Sex: Treating RN: 1951/09/07 (71 y.o. [name] Primary Care Bradford Cazier: [name] Other Clinician: Referring Fitzpatrick Alberico: Treating Harveer Sadler/Extender: Casandra Doffing in Treatment: 6 Encounter Discharge Information Items Post Procedure Vitals Discharge Condition: Stable Pulse (bpm): 76 Ambulatory Status: Ambulatory Respiratory Rate (breaths/min): 18 Discharge Destination: Home Blood Pressure (mmHg): 121/72 Transportation: Private Auto Unable to obtain vitals Reason: unable to obtain temperature. Accompanied By: self Schedule Follow-up Appointment: Yes Clinical Summary of Care: Electronic Signature(s) Signed: 05/29/2022 3:50:54 PM By: Deon Pilling RN, BSN Entered By: Deon Pilling on 05/29/2022 08:25:56 -------------------------------------------------------------------------------- Lower Extremity Assessment Details Patient Name: Date of Service: [name], Marcus [name] 05/29/2022 8:30 A M Medical Record Number: [id number] Patient Account Number: [id number] Date of Birth/Sex: Treating RN: 10-17-1951 (71 y.o. [name] Primary Care Ohm Dentler: [name] Other Clinician: Referring Cyncere Ruhe: Treating Tzipporah Nagorski/Extender: [name] Weeks in Treatment: 6 Edema Assessment Assessed: [Left: No] [Right: No] E[Left: dema] [Right: :] Calf Left: Right: Point of Measurement: 34 cm From Medial Instep 36 cm Ankle Left: Right: Point of Measurement: 12 cm From Medial Instep 24 cm Vascular Assessment Pulses: Dorsalis Pedis Palpable: [Left:Yes] Posterior Tibial Palpable: [Left:Yes] Electronic Signature(s) Signed: 05/29/2022 3:50:54 PM By: Deon Pilling RN, BSN Entered By: Deon Pilling on 05/29/2022 08:14:52 -------------------------------------------------------------------------------- Multi Wound Chart Details Patient Name: Date of Service: [name], Marcus [name] 05/29/2022 8:30 A M Medical Record Number: [id number] Patient Account Number: [id number] Date of Birth/Sex: Treating RN: 12-20-1951 (71 y.o. [name] Primary Care Marquay Kruse: [name] Other Clinician: Referring  Cadience Bradfield: Treating Kyrra Prada/Extender: [name] Weeks in Treatment: 6 Vital Signs Height(in): 70 Capillary Blood Glucose(mg/dl): 212 Weight(lbs): 219 Pulse(bpm): 65 Body Mass Index(BMI): 31.4 Blood Pressure(mmHg): 121/72 Temperature(F): Respiratory Rate(breaths/min): 16 Photos: [3:Left, Anterior Lower Leg] [4:Left, Lateral Lower Leg] [N/A:N/A N/A] Wound Location: [3:Trauma] [4:Trauma] [N/A:N/A] Wounding Event: [3:Diabetic Wound/Ulcer of the Lower] [4:Diabetic Wound/Ulcer of the Lower] [N/A:N/A] Primary Etiology: [3:Extremity Cataracts, Arrhythmia, Coronary] [4:Extremity Cataracts, Arrhythmia, Coronary] [N/A:N/A] Comorbid History: [3:Artery Disease, Hypertension, Myocardial Infarction, Peripheral Arterial Disease, Peripheral  Venous Arterial Disease, Peripheral Venous Disease, Type II Diabetes, Osteoarthritis, Neuropathy, Received Osteoarthritis, Neuropathy,  Received Radiation 04/10/2022] [4:Artery Disease, Hypertension, Myocardial Infarction, Peripheral Disease, Type II Diabetes, Radiation 04/10/2022] [N/A:N/A] Date Acquired: [3:6] [4:6] [N/A:N/A] Weeks of Treatment: [3:Open] [4:Open] [N/A:N/A] Wound Status: [3:No] [4:No] [N/A:N/A] Wound Recurrence: [3:3x1x0.1] [4:0.1x0.1x0.1] [N/A:N/A] Measurements L x W x D (cm) [3:2.356] [4:0.008] [N/A:N/A] A (cm) : rea [3:0.236] [4:0.001] [N/A:N/A] Volume (cm) : [3:-11.10%] [4:97.60%] [N/A:N/A] % Reduction in A [3:rea: -11.30%] [4:97.00%] [N/A:N/A] % Reduction in Volume: [3:Grade 1] [4:Grade 1] [N/A:N/A] Classification: [3:Medium] [4:Medium] [N/A:N/A] Exudate A mount: [3:Serosanguineous] [4:Serosanguineous] [N/A:N/A] Exudate Type: [3:red, brown] [4:red, brown] [N/A:N/A] Exudate Color: [3:Flat and Intact] [4:Flat and Intact] [N/A:N/A] Wound Margin: [3:Large (67-100%)] [4:Medium (34-66%)] [N/A:N/A] Granulation A mount: [3:Red, Pink] [4:Red] [N/A:N/A] Granulation Quality: [3:Small (1-33%)] [4:Medium (34-66%)]  [N/A:N/A] Necrotic A mount: [3:Fat Layer (Subcutaneous Tissue): Yes Fat Layer (Subcutaneous Tissue): Yes N/A] Exposed Structures: [3:Fascia: No Tendon: No Muscle: No Joint: No Bone: No Medium (34-66%)] [4:Fascia: No Tendon: No Muscle: No Joint: No Bone: No Medium (34-66%)] [N/A:N/A] Epithelialization: [3:Debridement - Excisional] [4:Debridement - Excisional] [N/A:N/A] Debridement: Pre-procedure Verification/Time Out 08:15 [4:08:15] [N/A:N/A] Taken: [3:Other] [4:Other] [N/A:N/A] Pain Control: [3:Subcutaneous, Slough] [4:Necrotic/Eschar, Subcutaneous] [N/A:N/A] Tissue Debrided: [3:Skin/Subcutaneous Tissue] [4:Skin/Subcutaneous Tissue] [N/A:N/A] Level: [3:3] [4:0.04] [N/A:N/A] Debridement A (sq cm): [3:rea Curette] [4:Curette] [N/A:N/A] Instrument: [3:Minimum] [4:Minimum] [N/A:N/A] Bleeding: [3:Pressure] [4:Pressure] [N/A:N/A] Hemostasis A chieved: [3:0] [4:0] [N/A:N/A] Procedural Pain: [3:0] [4:0] [N/A:N/A] Post Procedural Pain: [3:Procedure was tolerated well] [4:Procedure was tolerated well] [N/A:N/A] Debridement Treatment Response: [3:3x1x0.1] [4:0.1x0.1x0.1] [N/A:N/A] Post Debridement Measurements L x W x D (cm) [3:0.236] [4:0.001] [N/A:N/A] Post Debridement Volume: (cm) [3:Debridement] [4:Debridement] [N/A:N/A] Treatment Notes Wound #3 (Lower Leg) Wound Laterality: Left, Anterior Cleanser Soap and Water Discharge Instruction: May shower and wash wound with dial antibacterial soap and water prior to dressing change. Wound Cleanser Discharge Instruction: Cleanse the wound with wound cleanser prior to applying a clean dressing using gauze sponges, not tissue or cotton balls. Peri-Wound Care Triamcinolone 15 (g) Discharge Instruction: Use triamcinolone 15 (g) as directed Sween Lotion (Moisturizing lotion) Discharge Instruction: Apply moisturizing lotion as directed Topical Mupirocin Ointment Discharge Instruction: Apply Mupirocin (Bactroban) as instructed Primary  Dressing KerraCel Ag Gelling Fiber Dressing, 2x2 in (silver alginate) Discharge Instruction: Apply silver alginate to wound bed as instructed Secondary Dressing ABD Pad, 8x10 Discharge Instruction: Apply over primary dressing as directed. Woven Gauze Sponge, Non-Sterile 4x4 in Discharge Instruction: Apply over primary dressing as directed. Secured With [employer] Surgical T 2x10 (in/yd) ape Discharge Instruction: Secure with tape as directed. Compression Wrap Kerlix Roll 4.5x3.1 (in/yd) Discharge Instruction: Apply Kerlix and Coban compression as directed. Coban Self-Adherent Wrap 4x5 (in/yd) Discharge Instruction: Apply over Kerlix as directed. Compression Stockings Add-Ons Wound #4 (Lower Leg) Wound Laterality: Left, Lateral Cleanser Soap and Water Discharge Instruction: May shower and wash wound with dial antibacterial soap and water prior to dressing change. Wound Cleanser Discharge Instruction: Cleanse the wound with wound cleanser prior to applying a clean dressing using gauze sponges, not tissue or cotton balls. Peri-Wound Care Triamcinolone 15 (g) Discharge Instruction: Use triamcinolone 15 (g) as directed Sween Lotion (Moisturizing lotion) Discharge Instruction: Apply moisturizing lotion as directed Topical Mupirocin Ointment Discharge Instruction: Apply Mupirocin (Bactroban) as instructed Primary Dressing KerraCel Ag Gelling Fiber Dressing, 2x2 in (silver alginate) Discharge Instruction: Apply silver alginate to wound bed as instructed Secondary Dressing ABD Pad, 8x10 Discharge Instruction: Apply over primary dressing as directed. Woven Gauze Sponge, Non-Sterile 4x4 in Discharge Instruction: Apply over primary  dressing as directed. Secured With [employer] Surgical T 2x10 (in/yd) ape Discharge Instruction: Secure with tape as directed. Compression Wrap Kerlix Roll 4.5x3.1 (in/yd) Discharge Instruction: Apply Kerlix and Coban compression  as directed. Coban Self-Adherent Wrap 4x5 (in/yd) Discharge Instruction: Apply over Kerlix as directed. Compression Stockings Add-Ons Electronic Signature(s) Signed: 05/29/2022 8:27:53 AM By: Fredirick Maudlin MD FACS Signed: 05/30/2022 5:21:47 PM By: Sabas Sous By: Fredirick Maudlin on 05/29/2022 08:27:53 -------------------------------------------------------------------------------- Multi-Disciplinary Care Plan Details Patient Name: Date of Service: [name], Marcus [name] 05/29/2022 8:30 A M Medical Record Number: [id number] Patient Account Number: [id number] Date of Birth/Sex: Treating RN: [birth year] (71 y.o. [name] Primary Care Shonette Rhames: [name] Other Clinician: Referring Mela Perham: Treating Zachry Hopfensperger/Extender: [name] Weeks in Treatment: 6 Active Inactive Wound/Skin Impairment Nursing Diagnoses: Impaired tissue integrity Goals: Patient/caregiver will verbalize understanding of skin care regimen Date Initiated: 04/17/2022 Target Resolution Date: 06/23/2022 Goal Status: Active Interventions: Assess ulceration(s) every visit Treatment Activities: Skin care regimen initiated : 04/17/2022 Notes: Electronic Signature(s) Signed: 05/29/2022 3:50:54 PM By: Deon Pilling RN, BSN Entered By: Deon Pilling on 05/29/2022 08:21:04 -------------------------------------------------------------------------------- Pain Assessment Details Patient Name: Date of Service: [name], Marcus [name] 05/29/2022 8:30 A M Medical Record Number: [id number] Patient Account Number: [id number] Date of Birth/Sex: Treating RN: 26-Aug-1951 (71 y.o. [name] Primary Care Ming Kunka: [name] Other Clinician: Referring Mikayela Deats: Treating Shaira Sova/Extender: [name] Weeks in Treatment: 6 Active Problems Location of Pain Severity and Description of Pain Patient Has Paino No Site Locations Rate the pain. Rate the  pain. Current Pain Level: 0 Pain Management and Medication Current Pain Management: Medication: No Cold Application: No Rest: No Massage: No Activity: No SandovalE.N.S.: No Heat Application: No Leg drop or elevation: No Is the Current Pain Management Adequate: Adequate How does your wound impact your activities of daily livingo Sleep: No Bathing: No Appetite: No Relationship With Others: No Bladder Continence: No Emotions: No Bowel Continence: No Work: No Toileting: No Drive: No Dressing: No Hobbies: No Notes per patient at times sharp pain. Electronic Signature(s) Signed: 05/29/2022 3:50:54 PM By: Deon Pilling RN, BSN Entered By: Deon Pilling on 05/29/2022 08:13:35 -------------------------------------------------------------------------------- Patient/Caregiver Education Details Patient Name: Date of Service: [name], Fonnie Mu 6/5/2023andnbsp8:30 [place] Record Number: [id number] Patient Account Number: [id number] Date of Birth/Gender: Treating RN: [birth year] (71 y.o. [name] Primary Care Physician: [name] Other Clinician: Referring Physician: Treating Physician/Extender: Casandra Doffing in Treatment: 6 Education Assessment Education Provided To: Patient Education Topics Provided Wound/Skin Impairment: Handouts: Skin Care Do's and Dont's Methods: Explain/Verbal Responses: Reinforcements needed Electronic Signature(s) Signed: 05/29/2022 3:50:54 PM By: Deon Pilling RN, BSN Entered By: Deon Pilling on 05/29/2022 08:21:25 -------------------------------------------------------------------------------- Wound Assessment Details Patient Name: Date of Service: [name], Marcus [name] 05/29/2022 8:30 A M Medical Record Number: [id number] Patient Account Number: [id number] Date of Birth/Sex: Treating RN: 23-Sep-1951 (71 y.o. [name] Primary Care Majed Pellegrin: [name] Other Clinician: Referring Ronnell Makarewicz: Treating  Ariel Wingrove/Extender: [name] Weeks in Treatment: 6 Wound Status Wound Number: 3 Primary Diabetic Wound/Ulcer of the Lower Extremity Etiology: Wound Location: Left, Anterior Lower Leg Wound Open Wounding Event: Trauma Status: Date Acquired: 04/10/2022 Comorbid Cataracts, Arrhythmia, Coronary Artery Disease, Hypertension, Weeks Of Treatment: 6 History: Myocardial Infarction, Peripheral Arterial Disease, Peripheral Venous Clustered Wound: No Disease, Type II Diabetes, Osteoarthritis, Neuropathy, Received Radiation Photos Wound Measurements Length: (cm) 3 Width: (cm) 1 Depth: (cm)  0.1 Area: (cm) 2.356 Volume: (cm) 0.236 % Reduction in Area: -11.1% % Reduction in Volume: -11.3% Epithelialization: Medium (34-66%) Tunneling: No Undermining: No Wound Description Classification: Grade 1 Wound Margin: Flat and Intact Exudate Amount: Medium Exudate Type: Serosanguineous Exudate Color: red, brown Foul Odor After Cleansing: No Slough/Fibrino Yes Wound Bed Granulation Amount: Large (67-100%) Exposed Structure Granulation Quality: Red, Pink Fascia Exposed: No Necrotic Amount: Small (1-33%) Fat Layer (Subcutaneous Tissue) Exposed: Yes Necrotic Quality: Adherent Slough Tendon Exposed: No Muscle Exposed: No Joint Exposed: No Bone Exposed: No Treatment Notes Wound #3 (Lower Leg) Wound Laterality: Left, Anterior Cleanser Soap and Water Discharge Instruction: May shower and wash wound with dial antibacterial soap and water prior to dressing change. Wound Cleanser Discharge Instruction: Cleanse the wound with wound cleanser prior to applying a clean dressing using gauze sponges, not tissue or cotton balls. Peri-Wound Care Triamcinolone 15 (g) Discharge Instruction: Use triamcinolone 15 (g) as directed Sween Lotion (Moisturizing lotion) Discharge Instruction: Apply moisturizing lotion as directed Topical Mupirocin Ointment Discharge Instruction: Apply  Mupirocin (Bactroban) as instructed Primary Dressing KerraCel Ag Gelling Fiber Dressing, 2x2 in (silver alginate) Discharge Instruction: Apply silver alginate to wound bed as instructed Secondary Dressing ABD Pad, 8x10 Discharge Instruction: Apply over primary dressing as directed. Woven Gauze Sponge, Non-Sterile 4x4 in Discharge Instruction: Apply over primary dressing as directed. Secured With [employer] Surgical T 2x10 (in/yd) ape Discharge Instruction: Secure with tape as directed. Compression Wrap Kerlix Roll 4.5x3.1 (in/yd) Discharge Instruction: Apply Kerlix and Coban compression as directed. Coban Self-Adherent Wrap 4x5 (in/yd) Discharge Instruction: Apply over Kerlix as directed. Compression Stockings Add-Ons Electronic Signature(s) Signed: 05/29/2022 3:50:54 PM By: Deon Pilling RN, BSN Signed: 05/29/2022 4:10:20 PM By: Rhae Hammock RN Entered By: Rhae Hammock on 05/29/2022 08:17:12 -------------------------------------------------------------------------------- Wound Assessment Details Patient Name: Date of Service: [name], Marcus [name] 05/29/2022 8:30 A M Medical Record Number: [id number] Patient Account Number: [id number] Date of Birth/Sex: Treating RN: 1951-07-23 (71 y.o. [name] Primary Care Zylon Creamer: [name] Other Clinician: Referring Jonerik Sliker: Treating Acea Yagi/Extender: [name] Weeks in Treatment: 6 Wound Status Wound Number: 4 Primary Diabetic Wound/Ulcer of the Lower Extremity Etiology: Wound Location: Left, Lateral Lower Leg Wound Open Wounding Event: Trauma Status: Date Acquired: 04/10/2022 Comorbid Cataracts, Arrhythmia, Coronary Artery Disease, Hypertension, Weeks Of Treatment: 6 History: Myocardial Infarction, Peripheral Arterial Disease, Peripheral Venous Clustered Wound: No Disease, Type II Diabetes, Osteoarthritis, Neuropathy, Received Radiation Photos Wound Measurements Length:  (cm) 0.1 Width: (cm) 0.1 Depth: (cm) 0.1 Area: (cm) 0.008 Volume: (cm) 0.001 % Reduction in Area: 97.6% % Reduction in Volume: 97% Epithelialization: Medium (34-66%) Tunneling: No Undermining: No Wound Description Classification: Grade 1 Wound Margin: Flat and Intact Exudate Amount: Medium Exudate Type: Serosanguineous Exudate Color: red, brown Foul Odor After Cleansing: No Slough/Fibrino Yes Wound Bed Granulation Amount: Medium (34-66%) Exposed Structure Granulation Quality: Red Fascia Exposed: No Necrotic Amount: Medium (34-66%) Fat Layer (Subcutaneous Tissue) Exposed: Yes Necrotic Quality: Adherent Slough Tendon Exposed: No Muscle Exposed: No Joint Exposed: No Bone Exposed: No Treatment Notes Wound #4 (Lower Leg) Wound Laterality: Left, Lateral Cleanser Soap and Water Discharge Instruction: May shower and wash wound with dial antibacterial soap and water prior to dressing change. Wound Cleanser Discharge Instruction: Cleanse the wound with wound cleanser prior to applying a clean dressing using gauze sponges, not tissue or cotton balls. Peri-Wound Care Triamcinolone 15 (g) Discharge Instruction: Use triamcinolone 15 (g) as directed Sween Lotion (Moisturizing lotion) Discharge Instruction: Apply moisturizing lotion as  directed Topical Mupirocin Ointment Discharge Instruction: Apply Mupirocin (Bactroban) as instructed Primary Dressing KerraCel Ag Gelling Fiber Dressing, 2x2 in (silver alginate) Discharge Instruction: Apply silver alginate to wound bed as instructed Secondary Dressing ABD Pad, 8x10 Discharge Instruction: Apply over primary dressing as directed. Woven Gauze Sponge, Non-Sterile 4x4 in Discharge Instruction: Apply over primary dressing as directed. Secured With [employer] Surgical T 2x10 (in/yd) ape Discharge Instruction: Secure with tape as directed. Compression Wrap Kerlix Roll 4.5x3.1 (in/yd) Discharge Instruction: Apply Kerlix  and Coban compression as directed. Coban Self-Adherent Wrap 4x5 (in/yd) Discharge Instruction: Apply over Kerlix as directed. Compression Stockings Add-Ons Electronic Signature(s) Signed: 05/29/2022 3:50:54 PM By: Deon Pilling RN, BSN Signed: 05/29/2022 4:10:20 PM By: Rhae Hammock RN Entered By: Rhae Hammock on 05/29/2022 08:17:38 -------------------------------------------------------------------------------- Vitals Details Patient Name: Date of Service: [name], Marcus [name] 05/29/2022 8:30 A M Medical Record Number: [id number] Patient Account Number: [id number] Date of Birth/Sex: Treating RN: Mar 18, 1951 (71 y.o. [name] Primary Care Hallie Ertl: [name] Other Clinician: Referring Mckale Haffey: Treating Tyion Boylen/Extender: [name] Weeks in Treatment: 6 Vital Signs Time Taken: 08:12 Pulse (bpm): 76 Height (in): 70 Respiratory Rate (breaths/min): 16 Weight (lbs): 219 Blood Pressure (mmHg): 121/72 Body Mass Index (BMI): 31.4 Capillary Blood Glucose (mg/dl): 212 Reference Range: 80 - 120 mg / dl Electronic Signature(s) Signed: 05/29/2022 3:50:54 PM By: Deon Pilling RN, BSN Entered By: Deon Pilling on 05/29/2022 08:12:46

## 2022-05-30 NOTE — Telephone Encounter (Signed)
Forms to be signed will be in Dr. Victorino December box.

## 2022-05-30 NOTE — Progress Notes (Signed)
KURT, HOFFMEIER ([id number]) . Visit Report for 05/29/2022 Chief Complaint Document Details Patient Name: Date of Service: [name]. 05/29/2022 8:30 A M Medical Record Number: [id number] Patient Account Number: [id number] Date of Birth/Sex: Treating RN: September 17, 1951 (71 y.o. [name] Primary Care Provider: [name] Other Clinician: Referring Provider: Treating Provider/Extender: Casandra Doffing in Treatment: 6 Information Obtained from: Patient Chief Complaint Patient seen for complaints of Non-Healing Wounds. Electronic Signature(s) Signed: 05/29/2022 8:27:59 AM By: Fredirick Maudlin MD FACS Entered By: Fredirick Maudlin on 05/29/2022 08:27:59 -------------------------------------------------------------------------------- Debridement Details Patient Name: Date of Service: MO [name], Marcus [name] 05/29/2022 8:30 A M Medical Record Number: [id number] Patient Account Number: [id number] Date of Birth/Sex: Treating RN: January 25, 1951 (71 y.o. [name] Primary Care Provider: [name] Other Clinician: Referring Provider: Treating Provider/Extender: Casandra Doffing in Treatment: 6 Debridement Performed for Assessment: Wound #3 Left,Anterior Lower Leg Performed By: Physician Fredirick Maudlin, MD Debridement Type: Debridement Severity of Tissue Pre Debridement: Fat layer exposed Level of Consciousness (Pre-procedure): Awake and Alert Pre-procedure Verification/Time Out Yes - 08:15 Taken: Start Time: 08:16 Pain Control: Other : Benzocaine 20% T Area Debrided (L x W): otal 3 (cm) x 1 (cm) = 3 (cm) Tissue and other material debrided: Viable, Non-Viable, Slough, Subcutaneous, Skin: Dermis , Skin: Epidermis, Fibrin/Exudate, Slough Level: Skin/Subcutaneous Tissue Debridement Description: Excisional Instrument: Curette Bleeding: Minimum Hemostasis Achieved: Pressure End Time: 08:22 Procedural Pain: 0 Post  Procedural Pain: 0 Response to Treatment: Procedure was tolerated well Level of Consciousness (Post- Awake and Alert procedure): Post Debridement Measurements of Total Wound Length: (cm) 3 Width: (cm) 1 Depth: (cm) 0.1 Volume: (cm) 0.236 Character of Wound/Ulcer Post Debridement: Improved Severity of Tissue Post Debridement: Fat layer exposed Post Procedure Diagnosis Same as Pre-procedure Electronic Signature(s) Signed: 05/29/2022 11:15:03 AM By: Fredirick Maudlin MD FACS Signed: 05/29/2022 3:50:54 PM By: Deon Pilling RN, BSN Entered By: Deon Pilling on 05/29/2022 08:23:06 -------------------------------------------------------------------------------- Debridement Details Patient Name: Date of Service: MO [name], Marcus [name] 05/29/2022 8:30 A M Medical Record Number: [id number] Patient Account Number: [id number] Date of Birth/Sex: Treating RN: 08-04-1951 (71 y.o. [name], [name] Primary Care Provider: [name] Other Clinician: Referring Provider: Treating Provider/Extender: Casandra Doffing in Treatment: 6 Debridement Performed for Assessment: Wound #4 Left,Lateral Lower Leg Performed By: Physician Fredirick Maudlin, MD Debridement Type: Debridement Severity of Tissue Pre Debridement: Fat layer exposed Level of Consciousness (Pre-procedure): Awake and Alert Pre-procedure Verification/Time Out Yes - 08:15 Taken: Start Time: 08:16 Pain Control: Other : Benzocaine 20% T Area Debrided (L x W): otal 0.2 (cm) x 0.2 (cm) = 0.04 (cm) Tissue and other material debrided: Viable, Non-Viable, Eschar, Subcutaneous, Skin: Dermis , Skin: Epidermis Level: Skin/Subcutaneous Tissue Debridement Description: Excisional Instrument: Curette Bleeding: Minimum Hemostasis Achieved: Pressure End Time: 08:22 Procedural Pain: 0 Post Procedural Pain: 0 Response to Treatment: Procedure was tolerated well Level of Consciousness (Post- Awake and Alert procedure): Post  Debridement Measurements of Total Wound Length: (cm) 0.1 Width: (cm) 0.1 Depth: (cm) 0.1 Volume: (cm) 0.001 Character of Wound/Ulcer Post Debridement: Improved Severity of Tissue Post Debridement: Fat layer exposed Post Procedure Diagnosis Same as Pre-procedure Electronic Signature(s) Signed: 05/29/2022 11:15:03 AM By: Fredirick Maudlin MD FACS Signed: 05/29/2022 3:50:54 PM By: Deon Pilling RN, BSN Entered By: Deon Pilling on 05/29/2022 08:24:15 -------------------------------------------------------------------------------- HPI Details Patient Name: Date of Service: MO [name], Marcus [name] 05/29/2022 8:30 A M Medical Record Number: [id number] Patient Account Number:  [id number] Date of Birth/Sex: Treating RN: [birth year] (71 y.o. [name] Primary Care Provider: [name] Other Clinician: Referring Provider: Treating Provider/Extender: [name] Weeks in Treatment: 6 History of Present Illness HPI Description: 11/10/2020 patient presents today for initial evaluation in this clinic although I have seen this patient in [place] previous. Subsequently his issue today is different from when I saw him for I was treating him for a toe ulcer. Currently he is having issues with bilateral lower extremity ulcers after running into a metal bar. Upon inspection today he has wounds of the bilateral lower extremities which are consistent with having struck his legs on the anterior aspect. With that being said the patient did go to the ER for evaluation on October 11 where he was given Keflex initially. He went back to the ER after not improving on October 20 he was given Lasix at that point he states the Lasix seem to help the most. Has been using Neosporin and leaving this open area which is probably not the best way to go either to be honest. He has had arterial studies on March 2021 which showed a left ABI of 0.78 with a TBI of 0.34 and a right ABI of 1.18 with a  TBI 1.18. Nonetheless his arterial flow the left is not ideal but also I think potentially consistent with allowing this to heal. I think he can also support light compression with Kerlix and Coban based on this result. His most recent hemoglobin A1c that we could find was 7.3 and that was towards the end of 2020. The patient does have a history of heart disease unfortunately. He is also currently still a smoker. 11/24/2020 on evaluation today patient appears to be doing well with regard to his wounds. In fact both appear to be doing better after last week's rather last visit's debridement. Fortunately there is no signs of active infection at this time. No fevers, chills, nausea, vomiting, or diarrhea. Overall I am very pleased with where things stand today. 12/01/2020 on evaluation today patient appears to be doing well with regard to his leg ulcers. He has been tolerating the dressing changes without complication. Fortunately there is no signs of active infection at this time. No fevers, chills, nausea, vomiting, or diarrhea. 12/08/2020 on evaluation today patient appears to be doing very well in regard to his bilateral lower extremity ulcers. They seem to be doing excellent and he is making great progress. There does not appear to be any evidence of active infection at this time. No fevers, chills, nausea, vomiting, or diarrhea. 12/15/2020 upon evaluation today patient actually appears to be doing excellent in regard to his wounds. Has been tolerating the dressing changes without complication. Fortunately his wounds are showing signs of great improvement were using Hydrofera Blue. 12/29/2020 on evaluation today patient appears to be doing well with regard to his right leg which is completely healed. His left leg is also measuring smaller than not healed this is doing very well. Fortunately there is no signs of active infection at this time 01/05/2021 upon evaluation today patient actually appears to be  making signs of improvement with regard to his left leg. I am very pleased with how things are progressing. There is no evidence of active infection at this time. No fevers, chills, nausea, vomiting, or diarrhea. Patient is extremely happy with the fact that this is doing so well 01/12/2021 upon evaluation today patient appears to be doing excellent in regard to his leg  ulcer. Fortunately there is no signs of active infection at this time. No fevers, chills, nausea, vomiting, or diarrhea. He has been tolerating the dressing changes without complication. 01/19/2021 upon evaluation today patient appears to be doing well with regard to his wound. In fact this appears to be completely healed on the left leg. This is brand-new skin but overall seems to be doing quite well which is great news. No fevers, chills, nausea, vomiting, or diarrhea. READMISSION 04/17/2022 This is a patient has been seen on a number of occasions in the wound care center. He is 71 years old and has type 2 diabetes mellitus, coronary artery disease, chronic venous insufficiency, and peripheral arterial disease with recent vascular studies demonstrating a right TBI of 0.9 (normal) and a left great toe TBI of 0.33. 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 He recently dropped an object and it hit him in the left shin. He then bumped the same leg against his wagon. He now has 2 wounds on his left anterior lower leg. He says that he applied Aquaphor to keep the areas moist and made an appointment in the wound care center for follow-up. 04/24/2022: Today, he is here with what he describes as 10 out of 10 pain in his wounds. Both of them measured a little bit larger today, but this may be secondary to the debridement that was performed last week. He says that due to it being spring season, he is on his feet quite a bit more. The leg is red and warm. We have been using silver alginate with Kerlix and Coban wraps. 05/01/2022: PCR  culture taken last week grew out MRSA. I prescribed doxycycline. He has been taking this. Both wounds are a bit smaller today and somewhat less painful. They continue to accumulate a bit of slough. 05/08/2022: He has completed his course of oral doxycycline. We have been using mupirocin under silver alginate with Kerlix and Coban wrapping. Today, both wounds are a bit smaller but have accumulated slough on the surface. They are less tender. 05/23/2022: Both wounds are quite a bit more shallow today. There is a little bit of slough accumulation, but there is also good granulation tissue as well as some buds of epithelialized tissue coming through the wound. No surrounding erythema, induration, nor any purulent drainage. We have been using mupirocin with silver alginate and Kerlix/Coban wrapping. 05/29/2022: The more anterior wound continues to demonstrate encroaching epithelium and the remaining open portion has good granulation tissue. The more lateral wound is nearly closed with just a pinpoint opening. No concern for infection. Electronic Signature(s) Signed: 05/29/2022 8:29:42 AM By: Fredirick Maudlin MD FACS Entered By: Fredirick Maudlin on 05/29/2022 08:29:42 -------------------------------------------------------------------------------- Physical Exam Details Patient Name: Date of Service: MO [name], Marcus [name] 05/29/2022 8:30 A M Medical Record Number: [id number] Patient Account Number: [id number] Date of Birth/Sex: Treating RN: 07/07/1951 (71 y.o. [name] Primary Care Provider: [name] Other Clinician: Referring Provider: Treating Provider/Extender: [name] Weeks in Treatment: 6 Constitutional . . . . No acute distress. Respiratory Normal work of breathing on room air. Notes 05/29/2022: The larger more anterior wound has substantial epithelialization. There is some eschar and slough accumulation. The open wound surface has good granulation tissue  present. The smaller more lateral wound has just a pinhole opening and a little bit of overlying eschar. No concern for infection. Electronic Signature(s) Signed: 05/29/2022 8:31:09 AM By: Fredirick Maudlin MD FACS Entered By: Fredirick Maudlin on 05/29/2022  08:31:09 -------------------------------------------------------------------------------- Physician Orders Details Patient Name: Date of Service: [name]. 05/29/2022 8:30 A M Medical Record Number: [id number] Patient Account Number: [id number] Date of Birth/Sex: Treating RN: [birth year] (71 y.o. [name], [name] Primary Care Provider: [name] Other Clinician: Referring Provider: Treating Provider/Extender: Casandra Doffing in Treatment: 6 Verbal / Phone Orders: No Diagnosis Coding ICD-10 Coding Code Description [id number] Non-pressure chronic ulcer of other part of left lower leg with fat layer exposed E11.622 Type 2 diabetes mellitus with other skin ulcer I73.9 Peripheral vascular disease, unspecified I87.2 Venous insufficiency (chronic) (peripheral) Follow-up Appointments ppointment in 1 week. - Dr Celine Ahr and Lovena Le - Room 2 0745 06/05/2022 Return A Bathing/ Shower/ Hygiene May shower with protection but do not get wound dressing(s) wet. - Please do not get Left Leg wet/damp. Edema Control - Lymphedema / SCD / Other Bilateral Lower Extremities Avoid standing for long periods of time. Exercise regularly Moisturize legs daily. Wound Treatment Wound #3 - Lower Leg Wound Laterality: Left, Anterior Cleanser: Soap and Water 1 x Per Week/30 Days Discharge Instructions: May shower and wash wound with dial antibacterial soap and water prior to dressing change. Cleanser: Wound Cleanser 1 x Per Week/30 Days Discharge Instructions: Cleanse the wound with wound cleanser prior to applying a clean dressing using gauze sponges, not tissue or cotton balls. Peri-Wound Care: Triamcinolone 15 (g) 1 x Per Week/30  Days Discharge Instructions: Use triamcinolone 15 (g) as directed Peri-Wound Care: Sween Lotion (Moisturizing lotion) 1 x Per Week/30 Days Discharge Instructions: Apply moisturizing lotion as directed Topical: Mupirocin Ointment 1 x Per Week/30 Days Discharge Instructions: Apply Mupirocin (Bactroban) as instructed Prim Dressing: KerraCel Ag Gelling Fiber Dressing, 2x2 in (silver alginate) 1 x Per Week/30 Days ary Discharge Instructions: Apply silver alginate to wound bed as instructed Secondary Dressing: ABD Pad, 8x10 1 x Per Week/30 Days Discharge Instructions: Apply over primary dressing as directed. Secondary Dressing: Woven Gauze Sponge, Non-Sterile 4x4 in 1 x Per Week/30 Days Discharge Instructions: Apply over primary dressing as directed. Secured With: 54M Medipore Public affairs consultant Surgical T 2x10 (in/yd) 1 x Per Week/30 Days ape Discharge Instructions: Secure with tape as directed. Compression Wrap: Kerlix Roll 4.5x3.1 (in/yd) 1 x Per Week/30 Days Discharge Instructions: Apply Kerlix and Coban compression as directed. Compression Wrap: Coban Self-Adherent Wrap 4x5 (in/yd) 1 x Per Week/30 Days Discharge Instructions: Apply over Kerlix as directed. Wound #4 - Lower Leg Wound Laterality: Left, Lateral Cleanser: Soap and Water 1 x Per Week/30 Days Discharge Instructions: May shower and wash wound with dial antibacterial soap and water prior to dressing change. Cleanser: Wound Cleanser 1 x Per Week/30 Days Discharge Instructions: Cleanse the wound with wound cleanser prior to applying a clean dressing using gauze sponges, not tissue or cotton balls. Peri-Wound Care: Triamcinolone 15 (g) 1 x Per Week/30 Days Discharge Instructions: Use triamcinolone 15 (g) as directed Peri-Wound Care: Sween Lotion (Moisturizing lotion) 1 x Per Week/30 Days Discharge Instructions: Apply moisturizing lotion as directed Topical: Mupirocin Ointment 1 x Per Week/30 Days Discharge Instructions: Apply Mupirocin  (Bactroban) as instructed Prim Dressing: KerraCel Ag Gelling Fiber Dressing, 2x2 in (silver alginate) 1 x Per Week/30 Days ary Discharge Instructions: Apply silver alginate to wound bed as instructed Secondary Dressing: ABD Pad, 8x10 1 x Per Week/30 Days Discharge Instructions: Apply over primary dressing as directed. Secondary Dressing: Woven Gauze Sponge, Non-Sterile 4x4 in 1 x Per Week/30 Days Discharge Instructions: Apply over primary dressing as directed. Secured With: 54M Medipore  Soft Cloth Surgical T 2x10 (in/yd) 1 x Per Week/30 Days ape Discharge Instructions: Secure with tape as directed. Compression Wrap: Kerlix Roll 4.5x3.1 (in/yd) 1 x Per Week/30 Days Discharge Instructions: Apply Kerlix and Coban compression as directed. Compression Wrap: Coban Self-Adherent Wrap 4x5 (in/yd) 1 x Per Week/30 Days Discharge Instructions: Apply over Kerlix as directed. Electronic Signature(s) Signed: 05/29/2022 8:31:19 AM By: Fredirick Maudlin MD FACS Entered By: Fredirick Maudlin on 05/29/2022 08:31:19 -------------------------------------------------------------------------------- Problem List Details Patient Name: Date of Service: MO [name], Marcus [name] 05/29/2022 8:30 A M Medical Record Number: [id number] Patient Account Number: [id number] Date of Birth/Sex: Treating RN: January 10, 1951 (72 y.o. [name] Primary Care Provider: [name] Other Clinician: Referring Provider: Treating Provider/Extender: [name] Weeks in Treatment: 6 Active Problems ICD-10 Encounter Code Description Active Date MDM Diagnosis [phone] Non-pressure chronic ulcer of other part of left lower leg with fat layer exposed4/24/2023 No Yes E11.622 Type 2 diabetes mellitus with other skin ulcer 04/17/2022 No Yes I73.9 Peripheral vascular disease, unspecified 04/17/2022 No Yes I87.2 Venous insufficiency (chronic) (peripheral) 04/17/2022 No Yes Inactive Problems Resolved Problems Electronic  Signature(s) Signed: 05/29/2022 8:27:40 AM By: Fredirick Maudlin MD FACS Entered By: Fredirick Maudlin on 05/29/2022 08:27:40 -------------------------------------------------------------------------------- Progress Note Details Patient Name: Date of Service: MO [name], Marcus [name] 05/29/2022 8:30 A M Medical Record Number: [id number] Patient Account Number: [id number] Date of Birth/Sex: Treating RN: [birth year] (71 y.o. [name] Primary Care Provider: [name] Other Clinician: Referring Provider: Treating Provider/Extender: Casandra Doffing in Treatment: 6 Subjective Chief Complaint Information obtained from Patient Patient seen for complaints of Non-Healing Wounds. History of Present Illness (HPI) 11/10/2020 patient presents today for initial evaluation in this clinic although I have seen this patient in [place] previous. Subsequently his issue today is different from when I saw him for I was treating him for a toe ulcer. Currently he is having issues with bilateral lower extremity ulcers after running into a metal bar. Upon inspection today he has wounds of the bilateral lower extremities which are consistent with having struck his legs on the anterior aspect. With that being said the patient did go to the ER for evaluation on October 11 where he was given Keflex initially. He went back to the ER after not improving on October 20 he was given Lasix at that point he states the Lasix seem to help the most. Has been using Neosporin and leaving this open area which is probably not the best way to go either to be honest. He has had arterial studies on March 2021 which showed a left ABI of 0.78 with a TBI of 0.34 and a right ABI of 1.18 with a TBI 1.18. Nonetheless his arterial flow the left is not ideal but also I think potentially consistent with allowing this to heal. I think he can also support light compression with Kerlix and Coban based on this result.  His most recent hemoglobin A1c that we could find was 7.3 and that was towards the end of 2020. The patient does have a history of heart disease unfortunately. He is also currently still a smoker. 11/24/2020 on evaluation today patient appears to be doing well with regard to his wounds. In fact both appear to be doing better after last week's rather last visit's debridement. Fortunately there is no signs of active infection at this time. No fevers, chills, nausea, vomiting, or diarrhea. Overall I am very pleased with where things stand today. 12/01/2020 on  evaluation today patient appears to be doing well with regard to his leg ulcers. He has been tolerating the dressing changes without complication. Fortunately there is no signs of active infection at this time. No fevers, chills, nausea, vomiting, or diarrhea. 12/08/2020 on evaluation today patient appears to be doing very well in regard to his bilateral lower extremity ulcers. They seem to be doing excellent and he is making great progress. There does not appear to be any evidence of active infection at this time. No fevers, chills, nausea, vomiting, or diarrhea. 12/15/2020 upon evaluation today patient actually appears to be doing excellent in regard to his wounds. Has been tolerating the dressing changes without complication. Fortunately his wounds are showing signs of great improvement were using Hydrofera Blue. 12/29/2020 on evaluation today patient appears to be doing well with regard to his right leg which is completely healed. His left leg is also measuring smaller than not healed this is doing very well. Fortunately there is no signs of active infection at this time 01/05/2021 upon evaluation today patient actually appears to be making signs of improvement with regard to his left leg. I am very pleased with how things are progressing. There is no evidence of active infection at this time. No fevers, chills, nausea, vomiting, or diarrhea. Patient  is extremely happy with the fact that this is doing so well 01/12/2021 upon evaluation today patient appears to be doing excellent in regard to his leg ulcer. Fortunately there is no signs of active infection at this time. No fevers, chills, nausea, vomiting, or diarrhea. He has been tolerating the dressing changes without complication. 01/19/2021 upon evaluation today patient appears to be doing well with regard to his wound. In fact this appears to be completely healed on the left leg. This is brand-new skin but overall seems to be doing quite well which is great news. No fevers, chills, nausea, vomiting, or diarrhea. READMISSION 04/17/2022 This is a patient has been seen on a number of occasions in the wound care center. He is 71 years old and has type 2 diabetes mellitus, coronary artery disease, chronic venous insufficiency, and peripheral arterial disease with recent vascular studies demonstrating a right TBI of 0.9 (normal) and a left great toe TBI of 0.33. 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 He recently dropped an object and it hit him in the left shin. He then bumped the same leg against his wagon. He now has 2 wounds on his left anterior lower leg. He says that he applied Aquaphor to keep the areas moist and made an appointment in the wound care center for follow-up. 04/24/2022: Today, he is here with what he describes as 10 out of 10 pain in his wounds. Both of them measured a little bit larger today, but this may be secondary to the debridement that was performed last week. He says that due to it being spring season, he is on his feet quite a bit more. The leg is red and warm. We have been using silver alginate with Kerlix and Coban wraps. 05/01/2022: PCR culture taken last week grew out MRSA. I prescribed doxycycline. He has been taking this. Both wounds are a bit smaller today and somewhat less painful. They continue to accumulate a bit of slough. 05/08/2022: He has  completed his course of oral doxycycline. We have been using mupirocin under silver alginate with Kerlix and Coban wrapping. Today, both wounds are a bit smaller but have accumulated slough on the surface. They  are less tender. 05/23/2022: Both wounds are quite a bit more shallow today. There is a little bit of slough accumulation, but there is also good granulation tissue as well as some buds of epithelialized tissue coming through the wound. No surrounding erythema, induration, nor any purulent drainage. We have been using mupirocin with silver alginate and Kerlix/Coban wrapping. 05/29/2022: The more anterior wound continues to demonstrate encroaching epithelium and the remaining open portion has good granulation tissue. The more lateral wound is nearly closed with just a pinpoint opening. No concern for infection. Patient History Information obtained from Patient. Social History Current every day smoker - 1 PPD, Marital Status - Married, Alcohol Use - Rarely, Drug Use - No History, Caffeine Use - Daily - coffee, soda. Medical History Eyes Patient has history of Cataracts - both eyes Cardiovascular Patient has history of Arrhythmia - A-fib, Coronary Artery Disease, Hypertension, Myocardial Infarction - CABG x 4, Peripheral Arterial Disease, Peripheral Venous Disease Endocrine Patient has history of Type II Diabetes Musculoskeletal Patient has history of Osteoarthritis Neurologic Patient has history of Neuropathy Oncologic Patient has history of Received Radiation Medical A Surgical History Notes nd Cardiovascular Hyperlipidemia, CVA, AAA Oncologic Prostate cancer Objective Constitutional No acute distress. Vitals Time Taken: 8:12 AM, Height: 70 in, Weight: 219 lbs, BMI: 31.4, Pulse: 76 bpm, Respiratory Rate: 16 breaths/min, Blood Pressure: 121/72 mmHg, Capillary Blood Glucose: 212 mg/dl. Respiratory Normal work of breathing on room air. General Notes: 05/29/2022: The larger more  anterior wound has substantial epithelialization. There is some eschar and slough accumulation. The open wound surface has good granulation tissue present. The smaller more lateral wound has just a pinhole opening and a little bit of overlying eschar. No concern for infection. Integumentary (Hair, Skin) Wound #3 status is Open. Original cause of wound was Trauma. The date acquired was: 04/10/2022. The wound has been in treatment 6 weeks. The wound is located on the Left,Anterior Lower Leg. The wound measures 3cm length x 1cm width x 0.1cm depth; 2.356cm^2 area and 0.236cm^3 volume. There is Fat Layer (Subcutaneous Tissue) exposed. There is no tunneling or undermining noted. There is a medium amount of serosanguineous drainage noted. The wound margin is flat and intact. There is large (67-100%) red, pink granulation within the wound bed. There is a small (1-33%) amount of necrotic tissue within the wound bed including Adherent Slough. Wound #4 status is Open. Original cause of wound was Trauma. The date acquired was: 04/10/2022. The wound has been in treatment 6 weeks. The wound is located on the Left,Lateral Lower Leg. The wound measures 0.1cm length x 0.1cm width x 0.1cm depth; 0.008cm^2 area and 0.001cm^3 volume. There is Fat Layer (Subcutaneous Tissue) exposed. There is no tunneling or undermining noted. There is a medium amount of serosanguineous drainage noted. The wound margin is flat and intact. There is medium (34-66%) red granulation within the wound bed. There is a medium (34-66%) amount of necrotic tissue within the wound bed including Adherent Slough. Assessment Active Problems ICD-10 Non-pressure chronic ulcer of other part of left lower leg with fat layer exposed Type 2 diabetes mellitus with other skin ulcer Peripheral vascular disease, unspecified Venous insufficiency (chronic) (peripheral) Procedures Wound #3 Pre-procedure diagnosis of Wound #3 is a Diabetic Wound/Ulcer of the  Lower Extremity located on the Left,Anterior Lower Leg .Severity of Tissue Pre Debridement is: Fat layer exposed. There was a Excisional Skin/Subcutaneous Tissue Debridement with a total area of 3 sq cm performed by Fredirick Maudlin, MD. With the following instrument(s): Curette  to remove Viable and Non-Viable tissue/material. Material removed includes Subcutaneous Tissue, Slough, Skin: Dermis, Skin: Epidermis, and Fibrin/Exudate after achieving pain control using Other (Benzocaine 20%). A time out was conducted at 08:15, prior to the start of the procedure. A Minimum amount of bleeding was controlled with Pressure. The procedure was tolerated well with a pain level of 0 throughout and a pain level of 0 following the procedure. Post Debridement Measurements: 3cm length x 1cm width x 0.1cm depth; 0.236cm^3 volume. Character of Wound/Ulcer Post Debridement is improved. Severity of Tissue Post Debridement is: Fat layer exposed. Post procedure Diagnosis Wound #3: Same as Pre-Procedure Wound #4 Pre-procedure diagnosis of Wound #4 is a Diabetic Wound/Ulcer of the Lower Extremity located on the Left,Lateral Lower Leg .Severity of Tissue Pre Debridement is: Fat layer exposed. There was a Excisional Skin/Subcutaneous Tissue Debridement with a total area of 0.04 sq cm performed by Fredirick Maudlin, MD. With the following instrument(s): Curette to remove Viable and Non-Viable tissue/material. Material removed includes Eschar, Subcutaneous Tissue, Skin: Dermis, and Skin: Epidermis after achieving pain control using Other (Benzocaine 20%). A time out was conducted at 08:15, prior to the start of the procedure. A Minimum amount of bleeding was controlled with Pressure. The procedure was tolerated well with a pain level of 0 throughout and a pain level of 0 following the procedure. Post Debridement Measurements: 0.1cm length x 0.1cm width x 0.1cm depth; 0.001cm^3 volume. Character of Wound/Ulcer Post Debridement is  improved. Severity of Tissue Post Debridement is: Fat layer exposed. Post procedure Diagnosis Wound #4: Same as Pre-Procedure Plan Follow-up Appointments: Return Appointment in 1 week. - Dr Celine Ahr and [facility] - Room 2 [place] 06/05/2022 Bathing/ Shower/ Hygiene: May shower with protection but do not get wound dressing(s) wet. - Please do not get Left Leg wet/damp. Edema Control - Lymphedema / SCD / Other: Avoid standing for long periods of time. Exercise regularly Moisturize legs daily. WOUND #3: - Lower Leg Wound Laterality: Left, Anterior Cleanser: Soap and Water 1 x Per Week/30 Days Discharge Instructions: May shower and wash wound with dial antibacterial soap and water prior to dressing change. Cleanser: Wound Cleanser 1 x Per Week/30 Days Discharge Instructions: Cleanse the wound with wound cleanser prior to applying a clean dressing using gauze sponges, not tissue or cotton balls. Peri-Wound Care: Triamcinolone 15 (g) 1 x Per Week/30 Days Discharge Instructions: Use triamcinolone 15 (g) as directed Peri-Wound Care: Sween Lotion (Moisturizing lotion) 1 x Per Week/30 Days Discharge Instructions: Apply moisturizing lotion as directed Topical: Mupirocin Ointment 1 x Per Week/30 Days Discharge Instructions: Apply Mupirocin (Bactroban) as instructed Prim Dressing: KerraCel Ag Gelling Fiber Dressing, 2x2 in (silver alginate) 1 x Per Week/30 Days ary Discharge Instructions: Apply silver alginate to wound bed as instructed Secondary Dressing: ABD Pad, 8x10 1 x Per Week/30 Days Discharge Instructions: Apply over primary dressing as directed. Secondary Dressing: Woven Gauze Sponge, Non-Sterile 4x4 in 1 x Per Week/30 Days Discharge Instructions: Apply over primary dressing as directed. Secured With: 50M Medipore Public affairs consultant Surgical T 2x10 (in/yd) 1 x Per Week/30 Days ape Discharge Instructions: Secure with tape as directed. Com pression Wrap: Kerlix Roll 4.5x3.1 (in/yd) 1 x Per Week/30  Days Discharge Instructions: Apply Kerlix and Coban compression as directed. Com pression Wrap: Coban Self-Adherent Wrap 4x5 (in/yd) 1 x Per Week/30 Days Discharge Instructions: Apply over Kerlix as directed. WOUND #4: - Lower Leg Wound Laterality: Left, Lateral Cleanser: Soap and Water 1 x Per Week/30 Days Discharge Instructions: May shower and wash  wound with dial antibacterial soap and water prior to dressing change. Cleanser: Wound Cleanser 1 x Per Week/30 Days Discharge Instructions: Cleanse the wound with wound cleanser prior to applying a clean dressing using gauze sponges, not tissue or cotton balls. Peri-Wound Care: Triamcinolone 15 (g) 1 x Per Week/30 Days Discharge Instructions: Use triamcinolone 15 (g) as directed Peri-Wound Care: Sween Lotion (Moisturizing lotion) 1 x Per Week/30 Days Discharge Instructions: Apply moisturizing lotion as directed Topical: Mupirocin Ointment 1 x Per Week/30 Days Discharge Instructions: Apply Mupirocin (Bactroban) as instructed Prim Dressing: KerraCel Ag Gelling Fiber Dressing, 2x2 in (silver alginate) 1 x Per Week/30 Days ary Discharge Instructions: Apply silver alginate to wound bed as instructed Secondary Dressing: ABD Pad, 8x10 1 x Per Week/30 Days Discharge Instructions: Apply over primary dressing as directed. Secondary Dressing: Woven Gauze Sponge, Non-Sterile 4x4 in 1 x Per Week/30 Days Discharge Instructions: Apply over primary dressing as directed. Secured With: 71M Medipore Public affairs consultant Surgical T 2x10 (in/yd) 1 x Per Week/30 Days ape Discharge Instructions: Secure with tape as directed. Com pression Wrap: Kerlix Roll 4.5x3.1 (in/yd) 1 x Per Week/30 Days Discharge Instructions: Apply Kerlix and Coban compression as directed. Com pression Wrap: Coban Self-Adherent Wrap 4x5 (in/yd) 1 x Per Week/30 Days Discharge Instructions: Apply over Kerlix as directed. 05/29/2022: The larger more anterior wound has substantial epithelialization. There  is some eschar and slough accumulation. The open wound surface has good granulation tissue present. The smaller more lateral wound has just a pinhole opening and a little bit of overlying eschar. No concern for infection. I used a curette to debride eschar and slough from the anterior wound and eschar from the lateral wound. We will continue using mupirocin, silver alginate, and Kerlix/Coban wrap. Follow-up in 1 week. Electronic Signature(s) Signed: 05/29/2022 8:31:49 AM By: Fredirick Maudlin MD FACS Entered By: Fredirick Maudlin on 05/29/2022 08:31:48 -------------------------------------------------------------------------------- HxROS Details Patient Name: Date of Service: MO [name], Marcus [name] 05/29/2022 8:30 A M Medical Record Number: [id number] Patient Account Number: [id number] Date of Birth/Sex: Treating RN: 03-Oct-1951 (71 y.o. [name] Primary Care Provider: [name] Other Clinician: Referring Provider: Treating Provider/Extender: Casandra Doffing in Treatment: 6 Information Obtained From Patient Eyes Medical History: Positive for: Cataracts - both eyes Cardiovascular Medical History: Positive for: Arrhythmia - A-fib; Coronary Artery Disease; Hypertension; Myocardial Infarction - CABG x 4; Peripheral Arterial Disease; Peripheral Venous Disease Past Medical History Notes: Hyperlipidemia, CVA, AAA Endocrine Medical History: Positive for: Type II Diabetes Time with diabetes: over 5 years Treated with: Oral agents Blood sugar tested every day: No Musculoskeletal Medical History: Positive for: Osteoarthritis Neurologic Medical History: Positive for: Neuropathy Oncologic Medical History: Positive for: Received Radiation Past Medical History Notes: Prostate cancer HBO Extended History Items Eyes: Cataracts Immunizations Pneumococcal Vaccine: Received Pneumococcal Vaccination: Yes Received Pneumococcal Vaccination On or After  60th Birthday: Yes Implantable Devices None Family and Social History Current every day smoker - 1 PPD; Marital Status - Married; Alcohol Use: Rarely; Drug Use: No History; Caffeine Use: Daily - coffee, soda; Financial Concerns: No; Food, Clothing or Shelter Needs: No; Support System Lacking: No; Transportation Concerns: No Electronic Signature(s) Signed: 05/29/2022 11:15:03 AM By: Fredirick Maudlin MD FACS Signed: 05/30/2022 5:21:47 PM By: Adline Peals Entered By: Fredirick Maudlin on 05/29/2022 08:29:52 -------------------------------------------------------------------------------- SuperBill Details Patient Name: Date of Service: MO [name], Marcus [name] 05/29/2022 Medical Record Number: [id number] Patient Account Number: [id number] Date of Birth/Sex: Treating RN: [birth year] (71 y.o. [name] Primary Care  Provider: [name] Other Clinician: Referring Provider: Treating Provider/Extender: Casandra Doffing in Treatment: 6 Diagnosis Coding ICD-10 Codes Code Description [phone] Non-pressure chronic ulcer of other part of left lower leg with fat layer exposed E11.622 Type 2 diabetes mellitus with other skin ulcer I73.9 Peripheral vascular disease, unspecified I87.2 Venous insufficiency (chronic) (peripheral) Facility Procedures CPT4 Code: [id number] Description: 09323 - DEB SUBQ TISSUE 20 SQ CM/< ICD-10 Diagnosis Description L97.822 Non-pressure chronic ulcer of other part of left lower leg with fat layer expo Modifier: sed Quantity: 1 Physician Procedures : CPT4 Code Description Modifier [id number] - WC PHYS LEVEL 3 - EST PT 25 ICD-10 Diagnosis Description L97.822 Non-pressure chronic ulcer of other part of left lower leg with fat layer exposed E11.622 Type 2 diabetes mellitus with other skin ulcer  I73.9 Peripheral vascular disease, unspecified I87.2 Venous insufficiency (chronic) (peripheral) Quantity: 1 : [id number] 11042 - WC PHYS SUBQ TISS 20 SQ  CM ICD-10 Diagnosis Description L97.822 Non-pressure chronic ulcer of other part of left lower leg with fat layer exposed Quantity: 1 Electronic Signature(s) Signed: 05/29/2022 8:32:07 AM By: Fredirick Maudlin MD FACS Entered By: Fredirick Maudlin on 05/29/2022 08:32:07

## 2022-06-01 NOTE — Telephone Encounter (Signed)
Assistance papers have been faxed.

## 2022-06-05 ENCOUNTER — Encounter (HOSPITAL_BASED_OUTPATIENT_CLINIC_OR_DEPARTMENT_OTHER): Payer: PPO | Admitting: General Surgery

## 2022-06-05 ENCOUNTER — Ambulatory Visit: Payer: PPO | Admitting: Pulmonary Disease

## 2022-06-05 ENCOUNTER — Encounter: Payer: Self-pay | Admitting: Pulmonary Disease

## 2022-06-05 VITALS — BP 98/60 | HR 72 | Temp 97.4°F | Ht 68.0 in | Wt 223.8 lb

## 2022-06-05 DIAGNOSIS — J432 Centrilobular emphysema: Secondary | ICD-10-CM

## 2022-06-05 DIAGNOSIS — Z720 Tobacco use: Secondary | ICD-10-CM | POA: Diagnosis not present

## 2022-06-05 DIAGNOSIS — E11622 Type 2 diabetes mellitus with other skin ulcer: Secondary | ICD-10-CM | POA: Diagnosis not present

## 2022-06-05 DIAGNOSIS — R911 Solitary pulmonary nodule: Secondary | ICD-10-CM | POA: Diagnosis not present

## 2022-06-05 DIAGNOSIS — I7389 Other specified peripheral vascular diseases: Secondary | ICD-10-CM | POA: Diagnosis not present

## 2022-06-05 DIAGNOSIS — L97822 Non-pressure chronic ulcer of other part of left lower leg with fat layer exposed: Secondary | ICD-10-CM | POA: Diagnosis not present

## 2022-06-05 NOTE — Patient Instructions (Addendum)
Thank you for visiting Dr. Valeta Harms at [facility]. Today we recommend the following:  Orders Placed This Encounter  Procedures   Ambulatory referral to Cardiothoracic Surgery   Return in about 6 months (around 12/05/2022), or if symptoms worsen or fail to improve, for with Eric Form, NP, or Dr. Valeta Harms.    Please do your part to reduce the spread of COVID-19.

## 2022-06-05 NOTE — Progress Notes (Signed)
Synopsis: Referred in May 2023 for lung nodule by [name], PA-C  Subjective:   PATIENT ID: [name] GENDER: male DOB: 1951/12/22, MRN: [id number]  Chief Complaint  Patient presents with   Follow-up    Follow up. Patient has no complaints.     This is a 71 year old gentleman, past medical history of coronary artery disease, diabetes, hypertension, hyperlipidemia, CABG x4, prostate cancer. Patient was referred for evaluation after having a lung cancer screening CT on 04/24/2022 which revealed a peripheral right upper lobe apical segment enlarged pulmonary nodule at approximately 11.8 mm.  Previously it was 5.8 mm.  He also has a calcified granuloma in the right lower lobe.  From respiratory standpoint he is doing okay.  He has been a longtime smoker since he was 71 years old still smoking today 1 pack/day.  He works at the [employer].  He has been doing this for many years.  He used to do it with his mom and dad.  OV 06/05/2022:Here today for follow-up regarding lung nodule.  Patient was initially seen in May 2023 and was sent for a nuclear medicine PET scan.  Nuclear medicine PET scan shows no significant hypermetabolic uptake within the lesion.  Does not expect completely exclude a low-grade neoplasm.  Patient had pulmonary function test completed on 05/25/2022.  PFTs with normal ratio of 77 and a normal FEV1 FVC, normal TLC and a DLCO of 68%.    Past Medical History:  Diagnosis Date   Abdominal aortic aneurysm (AAA) (HCC)    3 cm per 07-02-18 US abdominal aorta US epic   Arthritis    Blood clot in vein    behind left knee   Chronic kidney disease    renal calculi   Chronic venous insufficiency LOWER EXTREMITIES   Coronary artery disease CARDIOLOGIST - DR  [id number]  LAST 1 WK AGO -- WILL REQUEST NOTE AND STRESS TEST   Diabetes mellitus without complication (HCC)    type 2   Hematuria    last year   Hyperglycemia    Hyperlipidemia    Hypertension     Mixed dyslipidemia    Myocardial infarction (HCC)    Neuropathy    toes only   Nocturia    Prostate cancer ([place]) 08/18/11   gleason 8, volume 24.4cc   S/P CABG x 4    ST elevation MI (STEMI) ([place]) 02-10-2008   S/P CABG   Stroke ([place]) 2016   occ trouble wriing with right hand   Urinary hesitancy    Vision abnormalities    resolved now   Wound discharge    since jan 2019 goes to wound center [facility] left great toe small open area changes dressing q 2 days with ointment provided by wound center, clear drainage occ     Family History  Problem Relation Age of Onset   Cancer Father        pancreatic   Diabetes Father      Past Surgical History:  Procedure Laterality Date   CARDIAC CATHETERIZATION  05/01/2012   grafts widely patent   CARDIOVASCULAR STRESS TEST  03-15-2010   INFERIOR WALL SCAR WITHOUT ANY MEANINGFUL ISCHEMIA/ EF 46% / LOW RISK SCAN   CATARACT EXTRACTION     CORONARY ARTERY BYPASS GRAFT  02-10-2008  DR Einar Gip   X4 VESSEL DISEASE / EMERGANT CABG   CYSTOSCOPY  02/08/2012   Procedure: CYSTOSCOPY FLEXIBLE;  Surgeon: Franchot Gallo, MD;  Location[facility];  Service: Urology;;   CYSTOSCOPY WITH RETROGRADE PYELOGRAM, URETEROSCOPY AND STENT PLACEMENT Right 09/12/2018   Procedure: CYSTOSCOPY WITH RETROGRADE PYELOGRAM, URETEROSCOPY AND STENT PLACEMENT, STONE EXTRACTION;  Surgeon: Franchot Gallo, MD;  Location: [facility];  Service: Urology;  Laterality: Right;   EP IMPLANTABLE DEVICE N/A 08/14/2016   Procedure: Loop Recorder Insertion;  Surgeon: Evans Lance, MD;  Location: [facility];  Service: Cardiovascular;  Laterality: N/A;   EXTRACORPOREAL SHOCK WAVE LITHOTRIPSY  2013   HOLMIUM LASER APPLICATION Right 3/71/6967   Procedure: HOLMIUM LASER APPLICATION;  Surgeon: Franchot Gallo, MD;  Location: [facility];  Service: Urology;  Laterality: Right;   KNEE SURGERY  age 6   RIGHT   LEFT HEART CATHETERIZATION WITH CORONARY/GRAFT ANGIOGRAM N/A  05/01/2012   Procedure: LEFT HEART CATHETERIZATION WITH Beatrix Fetters;  Surgeon: Sanda Klein, MD;  Location: [facility];  Service: Cardiovascular;  Laterality: N/A;   MULTIPLE TEETH EXTRACTIONS (23)/ FOUR QUADRANT ALVEOLPLASTY/ MANDIBULAR LATERAL EXOSTOSES REDUCTIONS  03-24-2010   CHRONIC PERIODONTITIS   RADIOACTIVE SEED IMPLANT  02/08/2012   Procedure: RADIOACTIVE SEED IMPLANT;  Surgeon: Franchot Gallo, MD;  Location: [facility];  Service: Urology;  Laterality: N/A;  C-ARM    SHOULDER SURGERY  1982   LEFT   TEE WITHOUT CARDIOVERSION N/A 08/14/2016   Procedure: TRANSESOPHAGEAL ECHOCARDIOGRAM (TEE);  Surgeon: Josue Hector, MD;  Location: [facility];  Service: Cardiovascular;  Laterality: N/A;   URETERAL STENT PLACEMENT      Social History   Socioeconomic History   Marital status: Married    Spouse name: Not on file   Number of children: Not on file   Years of education: Not on file   Highest education level: Not on file  Occupational History   Not on file  Tobacco Use   Smoking status: Every Day    Packs/day: 1.00    Years: 50.00    Total pack years: 50.00    Types: Cigarettes   Smokeless tobacco: Never   Tobacco comments:    PREVIOUSLY SMOKED 3PPD / DECREASED TO 1PPD SINCE 2009  Vaping Use   Vaping Use: Never used  Substance and Sexual Activity   Alcohol use: Not Currently    Alcohol/week: 2.0 standard drinks of alcohol    Types: 1 Cans of beer, 1 Shots of liquor per week    Comment: none in 3 years   Drug use: No   Sexual activity: Never  Other Topics Concern   Not on file  Social History Narrative   Not on file   Social Determinants of Health   Financial Resource Strain: Not on file  Food Insecurity: Not on file  Transportation Needs: Not on file  Physical Activity: Not on file  Stress: Not on file  Social Connections: Not on file  Intimate Partner Violence: Not on file     Allergies  Allergen Reactions   Bee Venom  Anaphylaxis   Penicillins Other (See Comments)    CAUSES "FREE BLEEDING" Has patient had a PCN reaction causing immediate rash, facial/tongue/throat swelling, SOB or lightheadedness with hypotension: No Has patient had a PCN reaction causing severe rash involving mucus membranes or skin necrosis: No Has patient had a PCN reaction that required hospitalization No Has patient had a PCN reaction occurring within the last 10 years: No If all of the above answers are "NO", then may proceed with Cephalosporin use.     Outpatient Medications Prior to Visit  Medication Sig Dispense Refill   apixaban (  ELIQUIS) 5 MG TABS tablet Take 1 tablet (5 mg total) by mouth 2 (two) times daily. 180 tablet 3   atorvastatin (LIPITOR) 10 MG tablet Take 1 tablet by mouth once daily 90 tablet 0   blood glucose meter kit and supplies KIT Dispense based on patient and insurance preference. 1 each 0   Continuous Blood Gluc Receiver (FREESTYLE LIBRE 14 DAY READER) DEVI 1 Device by Does not apply route daily. Use to check blood sugars every morning and 2 hours after largest meals 1 each 1   Continuous Blood Gluc Sensor (FREESTYLE LIBRE 14 DAY SENSOR) MISC USE TO CHECK BLOOD SUGARS IN THE MORNING FASTING AND  2  HOURS  AFTER  LARGEST  MEAL 6 each 0   FARXIGA 10 MG TABS tablet TAKE 1 TABLET BY MOUTH ONCE DAILY BEFORE BREAKFAST 90 tablet 0   finasteride (PROSCAR) 5 MG tablet Take 5 mg by mouth daily.     gabapentin (NEURONTIN) 300 MG capsule TAKE 2 CAPSULES BY MOUTH IN THE MORNING AND 2 IN THE EVENING AT  DINNERTIME 360 capsule 0   glucose blood (FREESTYLE LITE) test strip Use to check blood sugars every morning fasting 100 each 12   lisinopril (ZESTRIL) 5 MG tablet Take 1 tablet by mouth once daily 90 tablet 0   metFORMIN (GLUCOPHAGE-XR) 500 MG 24 hr tablet Take 1 tablet (500 mg total) by mouth daily with breakfast. 90 tablet 0   mirabegron ER (MYRBETRIQ) 50 MG TB24 tablet Take 1 tablet (50 mg total) by mouth daily. 30 tablet  11   Multiple Vitamin (MULTIVITAMIN WITH MINERALS) TABS tablet Take 1 tablet by mouth daily. Men's One-A-Day Multivitamin     oxybutynin (DITROPAN-XL) 5 MG 24 hr tablet Take 5 mg by mouth at bedtime.     tamsulosin (FLOMAX) 0.4 MG CAPS capsule Take 1 capsule by mouth once daily at bedtime 90 capsule 1   WELLBUTRIN SR 150 MG 12 hr tablet Take 1 tablet (150 mg total) by mouth 2 (two) times daily. 180 tablet 0   diclofenac Sodium (VOLTAREN) 1 % GEL Apply 2 g topically 4 (four) times daily as needed. (Patient not taking: Reported on 04/21/2022) 50 g 0   furosemide (LASIX) 20 MG tablet Take 1 tablet (20 mg total) by mouth daily as needed for edema. (Patient not taking: Reported on 04/21/2022) 30 tablet 0   methocarbamol (ROBAXIN) 500 MG tablet Take 1 tablet (500 mg total) by mouth every 8 (eight) hours as needed for muscle spasms. (Patient not taking: Reported on 04/21/2022) 20 tablet 0   mupirocin cream (BACTROBAN) 2 % Apply 1 application topically 2 (two) times daily. (Patient not taking: Reported on 04/21/2022) 15 g 0   No facility-administered medications prior to visit.    Review of Systems  Constitutional:  Negative for chills, fever, malaise/fatigue and weight loss.  HENT:  Negative for hearing loss, sore throat and tinnitus.   Eyes:  Negative for blurred vision and double vision.  Respiratory:  Positive for shortness of breath. Negative for cough, hemoptysis, sputum production, wheezing and stridor.   Cardiovascular:  Negative for chest pain, palpitations, orthopnea, leg swelling and PND.  Gastrointestinal:  Negative for abdominal pain, constipation, diarrhea, heartburn, nausea and vomiting.  Genitourinary:  Negative for dysuria, hematuria and urgency.  Musculoskeletal:  Negative for joint pain and myalgias.  Skin:  Negative for itching and rash.  Neurological:  Negative for dizziness, tingling, weakness and headaches.  Endo/Heme/Allergies:  Negative for environmental allergies. Does not  bruise/bleed easily.  Psychiatric/Behavioral:  Negative for depression. The patient is not nervous/anxious and does not have insomnia.   All other systems reviewed and are negative.    Objective:  Physical Exam Vitals reviewed.  Constitutional:      General: He is not in acute distress.    Appearance: He is well-developed. He is obese.  HENT:     Head: Normocephalic and atraumatic.  Eyes:     General: No scleral icterus.    Conjunctiva/sclera: Conjunctivae normal.     Pupils: Pupils are equal, round, and reactive to light.  Neck:     Vascular: No JVD.     Trachea: No tracheal deviation.  Cardiovascular:     Rate and Rhythm: Normal rate and regular rhythm.     Heart sounds: Normal heart sounds. No murmur heard. Pulmonary:     Effort: Pulmonary effort is normal. No tachypnea, accessory muscle usage or respiratory distress.     Breath sounds: No stridor. No wheezing, rhonchi or rales.     Comments: Diminished breath sounds bilaterally Abdominal:     General: There is no distension.     Palpations: Abdomen is soft.     Tenderness: There is no abdominal tenderness.  Musculoskeletal:        General: No tenderness.     Cervical back: Neck supple.  Lymphadenopathy:     Cervical: No cervical adenopathy.  Skin:    General: Skin is warm and dry.     Capillary Refill: Capillary refill takes less than 2 seconds.     Findings: No rash.  Neurological:     Mental Status: He is alert and oriented to person, place, and time.  Psychiatric:        Behavior: Behavior normal.      Vitals:   06/05/22 0937  BP: 98/60  Pulse: 72  Temp: (!) 97.4 F (36.3 C)  TempSrc: Oral  SpO2: 96%  Weight: 223 lb 12.8 oz (101.5 kg)  Height: 5' 8"  (1.727 m)    96% on RA BMI Readings from Last 3 Encounters:  06/05/22 34.03 kg/m  05/15/22 32.11 kg/m  04/21/22 32.37 kg/m   Wt Readings from Last 3 Encounters:  06/05/22 223 lb 12.8 oz (101.5 kg)  05/15/22 223 lb 12.8 oz (101.5 kg)  04/21/22  225 lb 9.6 oz (102.3 kg)     CBC    Component Value Date/Time   WBC 11.3 (H) 03/21/2022 0932   WBC 13.2 (H) 09/04/2018 1047   RBC 5.22 03/21/2022 0932   RBC 4.92 09/04/2018 1047   HGB 15.0 03/21/2022 0932   HCT 44.5 03/21/2022 0932   PLT 221 03/21/2022 0932   MCV 85 03/21/2022 0932   MCH 28.7 03/21/2022 0932   MCH 29.5 09/04/2018 1047   MCHC 33.7 03/21/2022 0932   MCHC 33.6 09/04/2018 1047   RDW 14.8 03/21/2022 0932   LYMPHSABS 3.9 (H) 03/21/2022 0932   MONOABS 0.5 08/15/2016 1314   EOSABS 0.2 03/21/2022 0932   BASOSABS 0.1 03/21/2022 0932    Chest Imaging:  May 2023 lung cancer screening CT: 11.8 mm enlarging right upper lobe pulmonary nodule concerning for malignancy. The patient's images have been independently reviewed by me.    June 2023: Nuclear medicine pet imaging: No significant hypermetabolic uptake within the lesion. The patient's images have been independently reviewed by me.    Pulmonary Functions Testing Results:    Latest Ref Rng & Units 05/25/2022   11:29 AM  PFT Results  FVC-Pre L  3.28  P  FVC-Predicted Pre % 80  P  FVC-Post L 3.45  P  FVC-Predicted Post % 84  P  Pre FEV1/FVC % % 73  P  Post FEV1/FCV % % 77  P  FEV1-Pre L 2.41  P  FEV1-Predicted Pre % 80  P  FEV1-Post L 2.64  P  DLCO uncorrected ml/min/mmHg 16.80  P  DLCO UNC% % 68  P  DLCO corrected ml/min/mmHg 16.80  P  DLCO COR %Predicted % 68  P  DLVA Predicted % 70  P  TLC L 6.15  P  TLC % Predicted % 92  P  RV % Predicted % 113  P    P Preliminary result    FeNO:   Pathology:   Echocardiogram:   Heart Catheterization:     Assessment & Plan:     ICD-10-CM   1. Right upper lobe pulmonary nodule  R91.1 Ambulatory referral to Cardiothoracic Surgery    2. Centrilobular emphysema ([place])  J43.2     3. Tobacco use  Z72.0        Discussion:  This is a 71 year old gentleman, right upper lobe pulmonary nodule found incidentally on a lung cancer screening CT that has  increased slowly over time was 5.6 mm and now at 11 mm in largest cross-section.  He has associated centrilobular emphysema.   Plan: Reviewed pet imaging results as well as PFTs today in the office with patient and patient's daughter. Referral placed to cardiothoracic surgery for consideration of wedge resection/possible lobectomy. Alternatively a conservative approach would be continue to watch this over 6 months but it has slowly grown making it concerning for a low-grade neoplasm.  Patient to follow-up with [place] in 6 months or as needed.  If they decide for waiting on surgery then we are happy to order a repeat CT scan of the chest if needed.    Current Outpatient Medications:    apixaban (ELIQUIS) 5 MG TABS tablet, Take 1 tablet (5 mg total) by mouth 2 (two) times daily., Disp: 180 tablet, Rfl: 3   atorvastatin (LIPITOR) 10 MG tablet, Take 1 tablet by mouth once daily, Disp: 90 tablet, Rfl: 0   blood glucose meter kit and supplies KIT, Dispense based on patient and insurance preference., Disp: 1 each, Rfl: 0   Continuous Blood Gluc Receiver (FREESTYLE LIBRE 14 DAY READER) DEVI, 1 Device by Does not apply route daily. Use to check blood sugars every morning and 2 hours after largest meals, Disp: 1 each, Rfl: 1   Continuous Blood Gluc Sensor (FREESTYLE LIBRE 14 DAY SENSOR) MISC, USE TO CHECK BLOOD SUGARS IN THE MORNING FASTING AND  2  HOURS  AFTER  LARGEST  MEAL, Disp: 6 each, Rfl: 0   FARXIGA 10 MG TABS tablet, TAKE 1 TABLET BY MOUTH ONCE DAILY BEFORE BREAKFAST, Disp: 90 tablet, Rfl: 0   finasteride (PROSCAR) 5 MG tablet, Take 5 mg by mouth daily., Disp: , Rfl:    gabapentin (NEURONTIN) 300 MG capsule, TAKE 2 CAPSULES BY MOUTH IN THE MORNING AND 2 IN THE EVENING AT  DINNERTIME, Disp: 360 capsule, Rfl: 0   glucose blood (FREESTYLE LITE) test strip, Use to check blood sugars every morning fasting, Disp: 100 each, Rfl: 12   lisinopril (ZESTRIL) 5 MG tablet, Take 1 tablet by mouth once daily,  Disp: 90 tablet, Rfl: 0   metFORMIN (GLUCOPHAGE-XR) 500 MG 24 hr tablet, Take 1 tablet (500 mg total) by mouth daily with breakfast., Disp: 90 tablet,  Rfl: 0   mirabegron ER (MYRBETRIQ) 50 MG TB24 tablet, Take 1 tablet (50 mg total) by mouth daily., Disp: 30 tablet, Rfl: 11   Multiple Vitamin (MULTIVITAMIN WITH MINERALS) TABS tablet, Take 1 tablet by mouth daily. Men's One-A-Day Multivitamin, Disp: , Rfl:    oxybutynin (DITROPAN-XL) 5 MG 24 hr tablet, Take 5 mg by mouth at bedtime., Disp: , Rfl:    tamsulosin (FLOMAX) 0.4 MG CAPS capsule, Take 1 capsule by mouth once daily at bedtime, Disp: 90 capsule, Rfl: 1   WELLBUTRIN SR 150 MG 12 hr tablet, Take 1 tablet (150 mg total) by mouth 2 (two) times daily., Disp: 180 tablet, Rfl: 0   diclofenac Sodium (VOLTAREN) 1 % GEL, Apply 2 g topically 4 (four) times daily as needed. (Patient not taking: Reported on 04/21/2022), Disp: 50 g, Rfl: 0   furosemide (LASIX) 20 MG tablet, Take 1 tablet (20 mg total) by mouth daily as needed for edema. (Patient not taking: Reported on 04/21/2022), Disp: 30 tablet, Rfl: 0   methocarbamol (ROBAXIN) 500 MG tablet, Take 1 tablet (500 mg total) by mouth every 8 (eight) hours as needed for muscle spasms. (Patient not taking: Reported on 04/21/2022), Disp: 20 tablet, Rfl: 0   mupirocin cream (BACTROBAN) 2 %, Apply 1 application topically 2 (two) times daily. (Patient not taking: Reported on 04/21/2022), Disp: 15 g, Rfl: 0    Garner Nash, DO [facility] 06/05/2022 9:56 AM

## 2022-06-05 NOTE — Progress Notes (Signed)
PONCIANO, SHEALY ([id number]) . Visit Report for 06/05/2022 Chief Complaint Document Details Patient Name: Date of Service: [name]. 06/05/2022 7:45 A M Medical Record Number: [id number] Patient Account Number: [id number] Date of Birth/Sex: Treating RN: 1951/07/27 (71 y.o. [name] Primary Care Provider: [name] Other Clinician: Referring Provider: Treating Provider/Extender: Casandra Doffing in Treatment: 7 Information Obtained from: Patient Chief Complaint Patient seen for complaints of Non-Healing Wounds. Electronic Signature(s) Signed: 06/05/2022 8:15:15 AM By: Fredirick Maudlin MD FACS Entered By: Fredirick Maudlin on 06/05/2022 08:15:14 -------------------------------------------------------------------------------- Debridement Details Patient Name: Date of Service: [name], Marcus [name] 06/05/2022 7:45 A M Medical Record Number: [id number] Patient Account Number: [id number] Date of Birth/Sex: Treating RN: [birth year] (71 y.o. [name] Primary Care Provider: [name] Other Clinician: Referring Provider: Treating Provider/Extender: Casandra Doffing in Treatment: 7 Debridement Performed for Assessment: Wound #3 Left,Anterior Lower Leg Performed By: Physician Fredirick Maudlin, MD Debridement Type: Debridement Severity of Tissue Pre Debridement: Fat layer exposed Level of Consciousness (Pre-procedure): Awake and Alert Pre-procedure Verification/Time Out Yes - 08:08 Taken: Start Time: 08:08 Pain Control: Other : benzocaine 20% T Area Debrided (L x W): otal 1.4 (cm) x 0.5 (cm) = 0.7 (cm) Tissue and other material debrided: Non-Viable, Eschar Level: Non-Viable Tissue Debridement Description: Selective/Open Wound Instrument: Curette Bleeding: Minimum Hemostasis Achieved: Pressure Procedural Pain: 0 Post Procedural Pain: 0 Response to Treatment: Procedure was tolerated well Level of  Consciousness (Post- Awake and Alert procedure): Post Debridement Measurements of Total Wound Length: (cm) 1.4 Width: (cm) 0.5 Depth: (cm) 0.1 Volume: (cm) 0.055 Character of Wound/Ulcer Post Debridement: Improved Severity of Tissue Post Debridement: Fat layer exposed Post Procedure Diagnosis Same as Pre-procedure Electronic Signature(s) Signed: 06/05/2022 9:47:02 AM By: Fredirick Maudlin MD FACS Signed: 06/05/2022 5:17:06 PM By: Adline Peals Entered By: Adline Peals on 06/05/2022 08:09:14 -------------------------------------------------------------------------------- HPI Details Patient Name: Date of Service: [name], Marcus [name] 06/05/2022 7:45 A M Medical Record Number: [id number] Patient Account Number: [id number] Date of Birth/Sex: Treating RN: 09-18-1951 (71 y.o. [name] Primary Care Provider: [name] Other Clinician: Referring Provider: Treating Provider/Extender: Casandra Doffing in Treatment: 7 History of Present Illness HPI Description: 11/10/2020 patient presents today for initial evaluation in this clinic although I have seen this patient in [place] previous. Subsequently his issue today is different from when I saw him for I was treating him for a toe ulcer. Currently he is having issues with bilateral lower extremity ulcers after running into a metal bar. Upon inspection today he has wounds of the bilateral lower extremities which are consistent with having struck his legs on the anterior aspect. With that being said the patient did go to the ER for evaluation on October 11 where he was given Keflex initially. He went back to the ER after not improving on October 20 he was given Lasix at that point he states the Lasix seem to help the most. Has been using Neosporin and leaving this open area which is probably not the best way to go either to be honest. He has had arterial studies on March 2021 which showed a left  ABI of 0.78 with a TBI of 0.34 and a right ABI of 1.18 with a TBI 1.18. Nonetheless his arterial flow the left is not ideal but also I think potentially consistent with allowing this to heal. I think he can also support light compression with Kerlix and Coban based  on this result. His most recent hemoglobin A1c that we could find was 7.3 and that was towards the end of 2020. The patient does have a history of heart disease unfortunately. He is also currently still a smoker. 11/24/2020 on evaluation today patient appears to be doing well with regard to his wounds. In fact both appear to be doing better after last week's rather last visit's debridement. Fortunately there is no signs of active infection at this time. No fevers, chills, nausea, vomiting, or diarrhea. Overall I am very pleased with where things stand today. 12/01/2020 on evaluation today patient appears to be doing well with regard to his leg ulcers. He has been tolerating the dressing changes without complication. Fortunately there is no signs of active infection at this time. No fevers, chills, nausea, vomiting, or diarrhea. 12/08/2020 on evaluation today patient appears to be doing very well in regard to his bilateral lower extremity ulcers. They seem to be doing excellent and he is making great progress. There does not appear to be any evidence of active infection at this time. No fevers, chills, nausea, vomiting, or diarrhea. 12/15/2020 upon evaluation today patient actually appears to be doing excellent in regard to his wounds. Has been tolerating the dressing changes without complication. Fortunately his wounds are showing signs of great improvement were using Hydrofera Blue. 12/29/2020 on evaluation today patient appears to be doing well with regard to his right leg which is completely healed. His left leg is also measuring smaller than not healed this is doing very well. Fortunately there is no signs of active infection at this  time 01/05/2021 upon evaluation today patient actually appears to be making signs of improvement with regard to his left leg. I am very pleased with how things are progressing. There is no evidence of active infection at this time. No fevers, chills, nausea, vomiting, or diarrhea. Patient is extremely happy with the fact that this is doing so well 01/12/2021 upon evaluation today patient appears to be doing excellent in regard to his leg ulcer. Fortunately there is no signs of active infection at this time. No fevers, chills, nausea, vomiting, or diarrhea. He has been tolerating the dressing changes without complication. 01/19/2021 upon evaluation today patient appears to be doing well with regard to his wound. In fact this appears to be completely healed on the left leg. This is brand-new skin but overall seems to be doing quite well which is great news. No fevers, chills, nausea, vomiting, or diarrhea. READMISSION 04/17/2022 This is a patient has been seen on a number of occasions in the wound care center. He is 71 years old and has type 2 diabetes mellitus, coronary artery disease, chronic venous insufficiency, and peripheral arterial disease with recent vascular studies demonstrating a right TBI of 0.9 (normal) and a left great toe TBI of 0.33. 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 He recently dropped an object and it hit him in the left shin. He then bumped the same leg against his wagon. He now has 2 wounds on his left anterior lower leg. He says that he applied Aquaphor to keep the areas moist and made an appointment in the wound care center for follow-up. 04/24/2022: Today, he is here with what he describes as 10 out of 10 pain in his wounds. Both of them measured a little bit larger today, but this may be secondary to the debridement that was performed last week. He says that due to it being spring season, he  is on his feet quite a bit more. The leg is red and warm. We have  been using silver alginate with Kerlix and Coban wraps. 05/01/2022: PCR culture taken last week grew out MRSA. I prescribed doxycycline. He has been taking this. Both wounds are a bit smaller today and somewhat less painful. They continue to accumulate a bit of slough. 05/08/2022: He has completed his course of oral doxycycline. We have been using mupirocin under silver alginate with Kerlix and Coban wrapping. Today, both wounds are a bit smaller but have accumulated slough on the surface. They are less tender. 05/23/2022: Both wounds are quite a bit more shallow today. There is a little bit of slough accumulation, but there is also good granulation tissue as well as some buds of epithelialized tissue coming through the wound. No surrounding erythema, induration, nor any purulent drainage. We have been using mupirocin with silver alginate and Kerlix/Coban wrapping. 05/29/2022: The more anterior wound continues to demonstrate encroaching epithelium and the remaining open portion has good granulation tissue. The more lateral wound is nearly closed with just a pinpoint opening. No concern for infection. 06/05/2022: The lateral wound has healed. The more anterior wound is much smaller today with just a little bit of surrounding eschar. The wound surface is healthy-appearing with good granulation tissue. Electronic Signature(s) Signed: 06/05/2022 8:15:47 AM By: Fredirick Maudlin MD FACS Entered By: Fredirick Maudlin on 06/05/2022 08:15:47 -------------------------------------------------------------------------------- Physical Exam Details Patient Name: Date of Service: [name], Marcus [name] 06/05/2022 7:45 A M Medical Record Number: [id number] Patient Account Number: [id number] Date of Birth/Sex: Treating RN: 1951-12-12 (71 y.o. [name] Primary Care Provider: [name] Other Clinician: Referring Provider: Treating Provider/Extender: [name] Weeks in Treatment:  7 Constitutional . . . . No acute distress.Marland Kitchen Respiratory Normal work of breathing on room air.. Notes 06/05/2022: The lateral wound has healed. The more anterior wound is much smaller today with just a little bit of surrounding eschar. The wound surface is healthy-appearing with good granulation tissue. Electronic Signature(s) Signed: 06/05/2022 8:16:15 AM By: Fredirick Maudlin MD FACS Entered By: Fredirick Maudlin on 06/05/2022 08:16:15 -------------------------------------------------------------------------------- Physician Orders Details Patient Name: Date of Service: [name], Marcus [name] 06/05/2022 7:45 A M Medical Record Number: [id number] Patient Account Number: [id number] Date of Birth/Sex: Treating RN: Apr 19, 1951 (71 y.o. [name] Primary Care Provider: [name] Other Clinician: Referring Provider: Treating Provider/Extender: Casandra Doffing in Treatment: 7 Verbal / Phone Orders: No Diagnosis Coding ICD-10 Coding Code Description [phone] Non-pressure chronic ulcer of other part of left lower leg with fat layer exposed E11.622 Type 2 diabetes mellitus with other skin ulcer I73.9 Peripheral vascular disease, unspecified I87.2 Venous insufficiency (chronic) (peripheral) Follow-up Appointments ppointment in 1 week. - Dr Celine Ahr - Room 2 - 6/20 at 7:45 AM Return A Bathing/ Shower/ Hygiene May shower with protection but do not get wound dressing(s) wet. - Please do not get Left Leg wet/damp. Edema Control - Lymphedema / SCD / Other Bilateral Lower Extremities Avoid standing for long periods of time. Exercise regularly Moisturize legs daily. Wound Treatment Wound #3 - Lower Leg Wound Laterality: Left, Anterior Cleanser: Soap and Water 1 x Per Week/30 Days Discharge Instructions: May shower and wash wound with dial antibacterial soap and water prior to dressing change. Cleanser: Wound Cleanser 1 x Per Week/30 Days Discharge  Instructions: Cleanse the wound with wound cleanser prior to applying a clean dressing using gauze sponges, not tissue or cotton balls.  Peri-Wound Care: Triamcinolone 15 (g) 1 x Per Week/30 Days Discharge Instructions: Use triamcinolone 15 (g) as directed Peri-Wound Care: Sween Lotion (Moisturizing lotion) 1 x Per Week/30 Days Discharge Instructions: Apply moisturizing lotion as directed Topical: Mupirocin Ointment 1 x Per Week/30 Days Discharge Instructions: Apply Mupirocin (Bactroban) as instructed Prim Dressing: KerraCel Ag Gelling Fiber Dressing, 2x2 in (silver alginate) 1 x Per Week/30 Days ary Discharge Instructions: Apply silver alginate to wound bed as instructed Secondary Dressing: ABD Pad, 8x10 1 x Per Week/30 Days Discharge Instructions: Apply over primary dressing as directed. Secondary Dressing: Woven Gauze Sponge, Non-Sterile 4x4 in 1 x Per Week/30 Days Discharge Instructions: Apply over primary dressing as directed. Secured With: 83M Medipore Public affairs consultant Surgical T 2x10 (in/yd) 1 x Per Week/30 Days ape Discharge Instructions: Secure with tape as directed. Compression Wrap: Kerlix Roll 4.5x3.1 (in/yd) 1 x Per Week/30 Days Discharge Instructions: Apply Kerlix and Coban compression as directed. Compression Wrap: Coban Self-Adherent Wrap 4x5 (in/yd) 1 x Per Week/30 Days Discharge Instructions: Apply over Kerlix as directed. Electronic Signature(s) Signed: 06/05/2022 9:47:02 AM By: Fredirick Maudlin MD FACS Entered By: Fredirick Maudlin on 06/05/2022 42:59:56 -------------------------------------------------------------------------------- Problem List Details Patient Name: Date of Service: [name], Marcus [name] 06/05/2022 7:45 A M Medical Record Number: [id number] Patient Account Number: [id number] Date of Birth/Sex: Treating RN: 1951-07-15 (71 y.o. [name] Primary Care Provider: [name] Other Clinician: Referring Provider: Treating Provider/Extender:  [name] Weeks in Treatment: 7 Active Problems ICD-10 Encounter Code Description Active Date MDM Diagnosis [phone] Non-pressure chronic ulcer of other part of left lower leg with fat layer exposed4/24/2023 No Yes E11.622 Type 2 diabetes mellitus with other skin ulcer 04/17/2022 No Yes I73.9 Peripheral vascular disease, unspecified 04/17/2022 No Yes I87.2 Venous insufficiency (chronic) (peripheral) 04/17/2022 No Yes Inactive Problems Resolved Problems Electronic Signature(s) Signed: 06/05/2022 8:15:03 AM By: Fredirick Maudlin MD FACS Entered By: Fredirick Maudlin on 06/05/2022 08:15:03 -------------------------------------------------------------------------------- Progress Note Details Patient Name: Date of Service: [name], Marcus [name] 06/05/2022 7:45 A M Medical Record Number: [id number] Patient Account Number: [id number] Date of Birth/Sex: Treating RN: 1951-02-29 (71 y.o. [name] Primary Care Provider: [name] Other Clinician: Referring Provider: Treating Provider/Extender: Casandra Doffing in Treatment: 7 Subjective Chief Complaint Information obtained from Patient Patient seen for complaints of Non-Healing Wounds. History of Present Illness (HPI) 11/10/2020 patient presents today for initial evaluation in this clinic although I have seen this patient in [place] previous. Subsequently his issue today is different from when I saw him for I was treating him for a toe ulcer. Currently he is having issues with bilateral lower extremity ulcers after running into a metal bar. Upon inspection today he has wounds of the bilateral lower extremities which are consistent with having struck his legs on the anterior aspect. With that being said the patient did go to the ER for evaluation on October 11 where he was given Keflex initially. He went back to the ER after not improving on October 20 he was given Lasix at that point  he states the Lasix seem to help the most. Has been using Neosporin and leaving this open area which is probably not the best way to go either to be honest. He has had arterial studies on March 2021 which showed a left ABI of 0.78 with a TBI of 0.34 and a right ABI of 1.18 with a TBI 1.18. Nonetheless his arterial flow the left is not ideal but also  I think potentially consistent with allowing this to heal. I think he can also support light compression with Kerlix and Coban based on this result. His most recent hemoglobin A1c that we could find was 7.3 and that was towards the end of 2020. The patient does have a history of heart disease unfortunately. He is also currently still a smoker. 11/24/2020 on evaluation today patient appears to be doing well with regard to his wounds. In fact both appear to be doing better after last week's rather last visit's debridement. Fortunately there is no signs of active infection at this time. No fevers, chills, nausea, vomiting, or diarrhea. Overall I am very pleased with where things stand today. 12/01/2020 on evaluation today patient appears to be doing well with regard to his leg ulcers. He has been tolerating the dressing changes without complication. Fortunately there is no signs of active infection at this time. No fevers, chills, nausea, vomiting, or diarrhea. 12/08/2020 on evaluation today patient appears to be doing very well in regard to his bilateral lower extremity ulcers. They seem to be doing excellent and he is making great progress. There does not appear to be any evidence of active infection at this time. No fevers, chills, nausea, vomiting, or diarrhea. 12/15/2020 upon evaluation today patient actually appears to be doing excellent in regard to his wounds. Has been tolerating the dressing changes without complication. Fortunately his wounds are showing signs of great improvement were using Hydrofera Blue. 12/29/2020 on evaluation today patient appears  to be doing well with regard to his right leg which is completely healed. His left leg is also measuring smaller than not healed this is doing very well. Fortunately there is no signs of active infection at this time 01/05/2021 upon evaluation today patient actually appears to be making signs of improvement with regard to his left leg. I am very pleased with how things are progressing. There is no evidence of active infection at this time. No fevers, chills, nausea, vomiting, or diarrhea. Patient is extremely happy with the fact that this is doing so well 01/12/2021 upon evaluation today patient appears to be doing excellent in regard to his leg ulcer. Fortunately there is no signs of active infection at this time. No fevers, chills, nausea, vomiting, or diarrhea. He has been tolerating the dressing changes without complication. 01/19/2021 upon evaluation today patient appears to be doing well with regard to his wound. In fact this appears to be completely healed on the left leg. This is brand-new skin but overall seems to be doing quite well which is great news. No fevers, chills, nausea, vomiting, or diarrhea. READMISSION 04/17/2022 This is a patient has been seen on a number of occasions in the wound care center. He is 71 years old and has type 2 diabetes mellitus, coronary artery disease, chronic venous insufficiency, and peripheral arterial disease with recent vascular studies demonstrating a right TBI of 0.9 (normal) and a left great toe TBI of 0.33. 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 He recently dropped an object and it hit him in the left shin. He then bumped the same leg against his wagon. He now has 2 wounds on his left anterior lower leg. He says that he applied Aquaphor to keep the areas moist and made an appointment in the wound care center for follow-up. 04/24/2022: Today, he is here with what he describes as 10 out of 10 pain in his wounds. Both of them measured a  little bit larger today,  but this may be secondary to the debridement that was performed last week. He says that due to it being spring season, he is on his feet quite a bit more. The leg is red and warm. We have been using silver alginate with Kerlix and Coban wraps. 05/01/2022: PCR culture taken last week grew out MRSA. I prescribed doxycycline. He has been taking this. Both wounds are a bit smaller today and somewhat less painful. They continue to accumulate a bit of slough. 05/08/2022: He has completed his course of oral doxycycline. We have been using mupirocin under silver alginate with Kerlix and Coban wrapping. Today, both wounds are a bit smaller but have accumulated slough on the surface. They are less tender. 05/23/2022: Both wounds are quite a bit more shallow today. There is a little bit of slough accumulation, but there is also good granulation tissue as well as some buds of epithelialized tissue coming through the wound. No surrounding erythema, induration, nor any purulent drainage. We have been using mupirocin with silver alginate and Kerlix/Coban wrapping. 05/29/2022: The more anterior wound continues to demonstrate encroaching epithelium and the remaining open portion has good granulation tissue. The more lateral wound is nearly closed with just a pinpoint opening. No concern for infection. 06/05/2022: The lateral wound has healed. The more anterior wound is much smaller today with just a little bit of surrounding eschar. The wound surface is healthy-appearing with good granulation tissue. Patient History Information obtained from Patient. Social History Current every day smoker - 1 PPD, Marital Status - Married, Alcohol Use - Rarely, Drug Use - No History, Caffeine Use - Daily - coffee, soda. Medical History Eyes Patient has history of Cataracts - both eyes Cardiovascular Patient has history of Arrhythmia - A-fib, Coronary Artery Disease, Hypertension, Myocardial Infarction - CABG  x 4, Peripheral Arterial Disease, Peripheral Venous Disease Endocrine Patient has history of Type II Diabetes Musculoskeletal Patient has history of Osteoarthritis Neurologic Patient has history of Neuropathy Oncologic Patient has history of Received Radiation Medical A Surgical History Notes nd Cardiovascular Hyperlipidemia, CVA, AAA Oncologic Prostate cancer Objective Constitutional No acute distress.. Vitals Time Taken: 7:49 AM, Height: 70 in, Weight: 219 lbs, BMI: 31.4, Temperature: 97.5 F, Pulse: 73 bpm, Respiratory Rate: 16 breaths/min, Blood Pressure: 129/78 mmHg. Respiratory Normal work of breathing on room air.. General Notes: 06/05/2022: The lateral wound has healed. The more anterior wound is much smaller today with just a little bit of surrounding eschar. The wound surface is healthy-appearing with good granulation tissue. Integumentary (Hair, Skin) Wound #3 status is Open. Original cause of wound was Trauma. The date acquired was: 04/10/2022. The wound has been in treatment 7 weeks. The wound is located on the Left,Anterior Lower Leg. The wound measures 1.4cm length x 0.5cm width x 0.1cm depth; 0.55cm^2 area and 0.055cm^3 volume. There is Fat Layer (Subcutaneous Tissue) exposed. There is no tunneling or undermining noted. There is a medium amount of serosanguineous drainage noted. The wound margin is flat and intact. There is large (67-100%) red, pink granulation within the wound bed. There is no necrotic tissue within the wound bed. Wound #4 status is Open. Original cause of wound was Trauma. The date acquired was: 04/10/2022. The wound has been in treatment 7 weeks. The wound is located on the Left,Lateral Lower Leg. The wound measures 0cm length x 0cm width x 0cm depth; 0cm^2 area and 0cm^3 volume. There is no tunneling or undermining noted. There is a none present amount of drainage noted. The wound margin  is flat and intact. There is no granulation within the wound  bed. There is no necrotic tissue within the wound bed. Assessment Active Problems ICD-10 Non-pressure chronic ulcer of other part of left lower leg with fat layer exposed Type 2 diabetes mellitus with other skin ulcer Peripheral vascular disease, unspecified Venous insufficiency (chronic) (peripheral) Procedures Wound #3 Pre-procedure diagnosis of Wound #3 is a Diabetic Wound/Ulcer of the Lower Extremity located on the Left,Anterior Lower Leg .Severity of Tissue Pre Debridement is: Fat layer exposed. There was a Selective/Open Wound Non-Viable Tissue Debridement with a total area of 0.7 sq cm performed by Fredirick Maudlin, MD. With the following instrument(s): Curette to remove Non-Viable tissue/material. Material removed includes Eschar after achieving pain control using Other (benzocaine 20%). No specimens were taken. A time out was conducted at 08:08, prior to the start of the procedure. A Minimum amount of bleeding was controlled with Pressure. The procedure was tolerated well with a pain level of 0 throughout and a pain level of 0 following the procedure. Post Debridement Measurements: 1.4cm length x 0.5cm width x 0.1cm depth; 0.055cm^3 volume. Character of Wound/Ulcer Post Debridement is improved. Severity of Tissue Post Debridement is: Fat layer exposed. Post procedure Diagnosis Wound #3: Same as Pre-Procedure Plan Follow-up Appointments: Return Appointment in 1 week. - Dr Celine Ahr - Room 2 - 6/20 at 7:45 AM Bathing/ Shower/ Hygiene: May shower with protection but do not get wound dressing(s) wet. - Please do not get Left Leg wet/damp. Edema Control - Lymphedema / SCD / Other: Avoid standing for long periods of time. Exercise regularly Moisturize legs daily. WOUND #3: - Lower Leg Wound Laterality: Left, Anterior Cleanser: Soap and Water 1 x Per Week/30 Days Discharge Instructions: May shower and wash wound with dial antibacterial soap and water prior to dressing change. Cleanser:  Wound Cleanser 1 x Per Week/30 Days Discharge Instructions: Cleanse the wound with wound cleanser prior to applying a clean dressing using gauze sponges, not tissue or cotton balls. Peri-Wound Care: Triamcinolone 15 (g) 1 x Per Week/30 Days Discharge Instructions: Use triamcinolone 15 (g) as directed Peri-Wound Care: Sween Lotion (Moisturizing lotion) 1 x Per Week/30 Days Discharge Instructions: Apply moisturizing lotion as directed Topical: Mupirocin Ointment 1 x Per Week/30 Days Discharge Instructions: Apply Mupirocin (Bactroban) as instructed Prim Dressing: KerraCel Ag Gelling Fiber Dressing, 2x2 in (silver alginate) 1 x Per Week/30 Days ary Discharge Instructions: Apply silver alginate to wound bed as instructed Secondary Dressing: ABD Pad, 8x10 1 x Per Week/30 Days Discharge Instructions: Apply over primary dressing as directed. Secondary Dressing: Woven Gauze Sponge, Non-Sterile 4x4 in 1 x Per Week/30 Days Discharge Instructions: Apply over primary dressing as directed. Secured With: 72M Med[employer] Public affairs consultant Surgical T 2x10 (in/yd) 1 x Per Week/30 Days ape Discharge Instructions: Secure with tape as directed. Com pression Wrap: Kerlix Roll 4.5x3.1 (in/yd) 1 x Per Week/30 Days Discharge Instructions: Apply Kerlix and Coban compression as directed. Com pression Wrap: Coban Self-Adherent Wrap 4x5 (in/yd) 1 x Per Week/30 Days Discharge Instructions: Apply over Kerlix as directed. 06/05/2022: The lateral wound has healed. The more anterior wound is much smaller today with just a little bit of surrounding eschar. The wound surface is healthy-appearing with good granulation tissue. I used a curette to debride the eschar. We will continue using mupirocin, silver alginate, and Kerlix and Coban wraps. Follow-up in 1 week. Electronic Signature(s) Signed: 06/05/2022 8:17:12 AM By: Fredirick Maudlin MD FACS Entered By: Fredirick Maudlin on 06/05/2022  08:17:12 -------------------------------------------------------------------------------- HxROS Details Patient  Name: Date of Service: [name]. 06/05/2022 7:45 A M Medical Record Number: [id number] Patient Account Number: [id number] Date of Birth/Sex: Treating RN: 09/01/1951 (71 y.o. [name] Primary Care Provider: [name] Other Clinician: Referring Provider: Treating Provider/Extender: Casandra Doffing in Treatment: 7 Information Obtained From Patient Eyes Medical History: Positive for: Cataracts - both eyes Cardiovascular Medical History: Positive for: Arrhythmia - A-fib; Coronary Artery Disease; Hypertension; Myocardial Infarction - CABG x 4; Peripheral Arterial Disease; Peripheral Venous Disease Past Medical History Notes: Hyperlipidemia, CVA, AAA Endocrine Medical History: Positive for: Type II Diabetes Time with diabetes: over 5 years Treated with: Oral agents Blood sugar tested every day: No Musculoskeletal Medical History: Positive for: Osteoarthritis Neurologic Medical History: Positive for: Neuropathy Oncologic Medical History: Positive for: Received Radiation Past Medical History Notes: Prostate cancer HBO Extended History Items Eyes: Cataracts Immunizations Pneumococcal Vaccine: Received Pneumococcal Vaccination: Yes Received Pneumococcal Vaccination On or After 60th Birthday: Yes Implantable Devices None Family and Social History Current every day smoker - 1 PPD; Marital Status - Married; Alcohol Use: Rarely; Drug Use: No History; Caffeine Use: Daily - coffee, soda; Financial Concerns: No; Food, Clothing or Shelter Needs: No; Support System Lacking: No; Transportation Concerns: No Electronic Signature(s) Signed: 06/05/2022 9:47:02 AM By: Fredirick Maudlin MD FACS Signed: 06/05/2022 5:17:06 PM By: Adline Peals Entered By: Fredirick Maudlin on 06/05/2022  08:15:53 -------------------------------------------------------------------------------- SuperBill Details Patient Name: Date of Service: [name], Marcus [name] 06/05/2022 Medical Record Number: [id number] Patient Account Number: [id number] Date of Birth/Sex: Treating RN: 06-Jun-1951 (71 y.o. [name] Primary Care Provider: [name] Other Clinician: Referring Provider: Treating Provider/Extender: [name] Weeks in Treatment: 7 Diagnosis Coding ICD-10 Codes Code Description [phone] Non-pressure chronic ulcer of other part of left lower leg with fat layer exposed E11.622 Type 2 diabetes mellitus with other skin ulcer I73.9 Peripheral vascular disease, unspecified I87.2 Venous insufficiency (chronic) (peripheral) Facility Procedures CPT4 Code: [id number] Description: [phone] - DEBRIDE WOUND 1ST 20 SQ CM OR < ICD-10 Diagnosis Description L97.822 Non-pressure chronic ulcer of other part of left lower leg with fat layer expose Modifier: d Quantity: 1 Physician Procedures : CPT4 Code Description Modifier [id number] - WC PHYS LEVEL 3 - EST PT 25 ICD-10 Diagnosis Description L97.822 Non-pressure chronic ulcer of other part of left lower leg with fat layer exposed E11.622 Type 2 diabetes mellitus with other skin ulcer  I73.9 Peripheral vascular disease, unspecified I87.2 Venous insufficiency (chronic) (peripheral) Quantity: 1 : [id number] - WC PHYS DEBR WO ANESTH 20 SQ CM ICD-10 Diagnosis Description L97.822 Non-pressure chronic ulcer of other part of left lower leg with fat layer exposed Quantity: 1 Electronic Signature(s) Signed: 06/05/2022 8:17:53 AM By: Fredirick Maudlin MD FACS Entered By: Fredirick Maudlin on 06/05/2022 08:17:53

## 2022-06-05 NOTE — Progress Notes (Signed)
GREGOR, DERSHEM ([id number]) . Visit Report for 06/05/2022 Arrival Information Details Patient Name: Date of Service: [name]. 06/05/2022 7:45 A M Medical Record Number: [id number] Patient Account Number: [id number] Date of Birth/Sex: Treating RN: Feb 05, 1951 (71 y.o. [name] Primary Care Nakira Litzau: [name] Other Clinician: Referring Danniela Mcbrearty: Treating Anu Stagner/Extender: Casandra Doffing in Treatment: 7 Visit Information History Since Last Visit Added or deleted any medications: No Patient Arrived: Ambulatory Any new allergies or adverse reactions: No Arrival Time: 07:46 Had a fall or experienced change in No Accompanied By: self activities of daily living that may affect Transfer Assistance: None risk of falls: Patient Identification Verified: Yes Signs or symptoms of abuse/neglect since last visito No Secondary Verification Process Completed: Yes Hospitalized since last visit: No Patient Requires Transmission-Based Precautions: No Implantable device outside of the clinic excluding No Patient Has Alerts: Yes cellular tissue based products placed in the center Patient Alerts: Patient on Blood Thinner since last visit: ABI R 1.11 Has Dressing in Place as Prescribed: Yes ABI L 0.72 Pain Present Now: No TBI L 0.33 Electronic Signature(s) Signed: 06/05/2022 5:17:06 PM By: Adline Peals Entered By: Adline Peals on 06/05/2022 07:49:13 -------------------------------------------------------------------------------- Encounter Discharge Information Details Patient Name: Date of Service: [name], Marcus [name] 06/05/2022 7:45 A M Medical Record Number: [id number] Patient Account Number: [id number] Date of Birth/Sex: Treating RN: [birth year] (71 y.o. [name] Primary Care Cordon Gassett: [name] Other Clinician: Referring Iliya Spivack: Treating Donterius Filley/Extender: Casandra Doffing in  Treatment: 7 Encounter Discharge Information Items Post Procedure Vitals Discharge Condition: Stable Temperature (F): 97.5 Ambulatory Status: Ambulatory Pulse (bpm): 73 Discharge Destination: Home Respiratory Rate (breaths/min): 16 Transportation: Private Auto Blood Pressure (mmHg): 129/78 Accompanied By: self Schedule Follow-up Appointment: Yes Clinical Summary of Care: Patient Declined Electronic Signature(s) Signed: 06/05/2022 5:17:06 PM By: Adline Peals Entered By: Adline Peals on 06/05/2022 08:23:10 -------------------------------------------------------------------------------- Lower Extremity Assessment Details Patient Name: Date of Service: [name] 06/05/2022 7:45 A M Medical Record Number: [id number] Patient Account Number: [id number] Date of Birth/Sex: Treating RN: 11-26-1951 (71 y.o. [name] Primary Care Lilo Wallington: [name] Other Clinician: Referring Marin Milley: Treating Axzel Rockhill/Extender: [name] Weeks in Treatment: 7 Edema Assessment Assessed: [Left: No] [Right: No] E[Left: dema] [Right: :] Calf Left: Right: Point of Measurement: 34 cm From Medial Instep 36.7 cm Ankle Left: Right: Point of Measurement: 12 cm From Medial Instep 24 cm Vascular Assessment Pulses: Dorsalis Pedis Palpable: [Left:Yes] Electronic Signature(s) Signed: 06/05/2022 5:17:06 PM By: Adline Peals Entered By: Adline Peals on 06/05/2022 07:54:31 -------------------------------------------------------------------------------- Multi Wound Chart Details Patient Name: Date of Service: [name], Marcus [name] 06/05/2022 7:45 A M Medical Record Number: [id number] Patient Account Number: [id number] Date of Birth/Sex: Treating RN: 1951-01-31 (71 y.o. [name] Primary Care Anayeli Arel: [name] Other Clinician: Referring Selita Staiger: Treating Didi Ganaway/Extender: [name] Weeks in  Treatment: 7 Vital Signs Height(in): 70 Pulse(bpm): 56 Weight(lbs): 219 Blood Pressure(mmHg): 129/78 Body Mass Index(BMI): 31.4 Temperature(F): 97.5 Respiratory Rate(breaths/min): 16 Photos: [3:Left, Anterior Lower Leg] [4:Left, Lateral Lower Leg] [N/A:N/A N/A] Wound Location: [3:Trauma] [4:Trauma] [N/A:N/A] Wounding Event: [3:Diabetic Wound/Ulcer of the Lower] [4:Diabetic Wound/Ulcer of the Lower] [N/A:N/A] Primary Etiology: [3:Extremity Cataracts, Arrhythmia, Coronary] [4:Extremity Cataracts, Arrhythmia, Coronary] [N/A:N/A] Comorbid History: [3:Artery Disease, Hypertension, Myocardial Infarction, Peripheral Arterial Disease, Peripheral Venous Arterial Disease, Peripheral Venous Disease, Type II Diabetes, Osteoarthritis, Neuropathy, Received Osteoarthritis, Neuropathy,  Received Radiation 04/10/2022] [4:Artery Disease, Hypertension, Myocardial Infarction, Peripheral  Disease, Type II Diabetes, Radiation 04/10/2022] [N/A:N/A] Date Acquired: [3:7] [4:7] [N/A:N/A] Weeks of Treatment: [3:Open] [4:Open] [N/A:N/A] Wound Status: [3:No] [4:No] [N/A:N/A] Wound Recurrence: [3:1.4x0.5x0.1] [4:0x0x0] [N/A:N/A] Measurements L x W x D (cm) [3:0.55] [4:0] [N/A:N/A] A (cm) : rea [3:0.055] [4:0] [N/A:N/A] Volume (cm) : [3:74.10%] [4:100.00%] [N/A:N/A] % Reduction in A [3:rea: 74.10%] [4:100.00%] [N/A:N/A] % Reduction in Volume: [3:Grade 1] [4:Grade 1] [N/A:N/A] Classification: [3:Medium] [4:None Present] [N/A:N/A] Exudate A mount: [3:Serosanguineous] [4:N/A] [N/A:N/A] Exudate Type: [3:red, brown] [4:N/A] [N/A:N/A] Exudate Color: [3:Flat and Intact] [4:Flat and Intact] [N/A:N/A] Wound Margin: [3:Large (67-100%)] [4:None Present (0%)] [N/A:N/A] Granulation A mount: [3:Red, Pink] [4:N/A] [N/A:N/A] Granulation Quality: [3:None Present (0%)] [4:None Present (0%)] [N/A:N/A] Necrotic A mount: [3:Fat Layer (Subcutaneous Tissue): Yes Fascia: No] [N/A:N/A] Exposed Structures: [3:Fascia: No Tendon: No  Muscle: No Joint: No Bone: No Large (67-100%)] [4:Fat Layer (Subcutaneous Tissue): No Tendon: No Muscle: No Joint: No Bone: No Large (67-100%)] [N/A:N/A] Epithelialization: [3:Debridement - Selective/Open Wound N/A] [N/A:N/A] Debridement: Pre-procedure Verification/Time Out 08:08 [4:N/A] [N/A:N/A] Taken: [3:Other] [4:N/A] [N/A:N/A] Pain Control: [3:Necrotic/Eschar] [4:N/A] [N/A:N/A] Tissue Debrided: [3:Non-Viable Tissue] [4:N/A] [N/A:N/A] Level: [3:0.7] [4:N/A] [N/A:N/A] Debridement A (sq cm): [3:rea Curette] [4:N/A] [N/A:N/A] Instrument: [3:Minimum] [4:N/A] [N/A:N/A] Bleeding: [3:Pressure] [4:N/A] [N/A:N/A] Hemostasis A chieved: [3:0] [4:N/A] [N/A:N/A] Procedural Pain: [3:0] [4:N/A] [N/A:N/A] Post Procedural Pain: [3:Procedure was tolerated well] [4:N/A] [N/A:N/A] Debridement Treatment Response: [3:1.4x0.5x0.1] [4:N/A] [N/A:N/A] Post Debridement Measurements L x W x D (cm) [3:0.055] [4:N/A] [N/A:N/A] Post Debridement Volume: (cm) [3:Debridement] [4:N/A] [N/A:N/A] Treatment Notes Electronic Signature(s) Signed: 06/05/2022 8:15:09 AM By: Fredirick Maudlin MD FACS Signed: 06/05/2022 5:17:06 PM By: Adline Peals Entered By: Fredirick Maudlin on 06/05/2022 08:15:09 -------------------------------------------------------------------------------- Multi-Disciplinary Care Plan Details Patient Name: Date of Service: [name], Marcus [name] 06/05/2022 7:45 A M Medical Record Number: [id number] Patient Account Number: [id number] Date of Birth/Sex: Treating RN: March 20, 1951 (71 y.o. [name] Primary Care Mandisa Persinger: [name] Other Clinician: Referring Cedrica Brune: Treating Celestina Gironda/Extender: [name] Weeks in Treatment: 7 Active Inactive Wound/Skin Impairment Nursing Diagnoses: Impaired tissue integrity Goals: Patient/caregiver will verbalize understanding of skin care regimen Date Initiated: 04/17/2022 Target Resolution Date: 06/23/2022 Goal Status:  Active Interventions: Assess ulceration(s) every visit Treatment Activities: Skin care regimen initiated : 04/17/2022 Notes: Electronic Signature(s) Signed: 06/05/2022 5:17:06 PM By: Adline Peals Entered By: Adline Peals on 06/05/2022 07:56:21 -------------------------------------------------------------------------------- Pain Assessment Details Patient Name: Date of Service: Marcus Milliner T. 06/05/2022 7:45 A M Medical Record Number: [id number] Patient Account Number: [id number] Date of Birth/Sex: Treating RN: 21-Oct-1951 (71 y.o. [name] Primary Care Benney [place]: [name] Other Clinician: Referring Chia Rock: Treating Ashlynn Gunnels/Extender: [name] Weeks in Treatment: 7 Active Problems Location of Pain Severity and Description of Pain Patient Has Paino No Site Locations Rate the pain. Current Pain Level: 0 Pain Management and Medication Current Pain Management: Electronic Signature(s) Signed: 06/05/2022 5:17:06 PM By: Adline Peals Entered By: Adline Peals on 06/05/2022 07:49:54 -------------------------------------------------------------------------------- Patient/Caregiver Education Details Patient Name: Date of Service: [name] 6/12/2023andnbsp7:45 A M Medical Record Number: [id number] Patient Account Number: [id number] Date of Birth/Gender: Treating RN: [birth year] (71 y.o. [name] Primary Care Physician: [name] Other Clinician: Referring Physician: Treating Physician/Extender: Casandra Doffing in Treatment: 7 Education Assessment Education Provided To: Patient Education Topics Provided Wound/Skin Impairment: Methods: Explain/Verbal Responses: Reinforcements needed, State content correctly Electronic Signature(s) Signed: 06/05/2022 5:17:06 PM By: Adline Peals Entered By: Adline Peals on 06/05/2022  07:56:34 -------------------------------------------------------------------------------- Wound Assessment Details Patient Name: Date  of Service: [name] 06/05/2022 7:45 A M Medical Record Number: [id number] Patient Account Number: [id number] Date of Birth/Sex: Treating RN: 08-26-1951 (71 y.o. [name] Primary Care Darek Eifler: [name] Other Clinician: Referring Baylei Siebels: Treating Keatyn Jawad/Extender: [name] Weeks in Treatment: 7 Wound Status Wound Number: 3 Primary Diabetic Wound/Ulcer of the Lower Extremity Etiology: Wound Location: Left, Anterior Lower Leg Wound Open Wounding Event: Trauma Status: Date Acquired: 04/10/2022 Comorbid Cataracts, Arrhythmia, Coronary Artery Disease, Hypertension, Weeks Of Treatment: 7 History: Myocardial Infarction, Peripheral Arterial Disease, Peripheral Venous Clustered Wound: No Disease, Type II Diabetes, Osteoarthritis, Neuropathy, Received Radiation Photos Wound Measurements Length: (cm) 1.4 Width: (cm) 0.5 Depth: (cm) 0.1 Area: (cm) 0.55 Volume: (cm) 0.055 % Reduction in Area: 74.1% % Reduction in Volume: 74.1% Epithelialization: Large (67-100%) Tunneling: No Undermining: No Wound Description Classification: Grade 1 Wound Margin: Flat and Intact Exudate Amount: Medium Exudate Type: Serosanguineous Exudate Color: red, brown Foul Odor After Cleansing: No Slough/Fibrino No Wound Bed Granulation Amount: Large (67-100%) Exposed Structure Granulation Quality: Red, Pink Fascia Exposed: No Necrotic Amount: None Present (0%) Fat Layer (Subcutaneous Tissue) Exposed: Yes Tendon Exposed: No Muscle Exposed: No Joint Exposed: No Bone Exposed: No Treatment Notes Wound #3 (Lower Leg) Wound Laterality: Left, Anterior Cleanser Soap and Water Discharge Instruction: May shower and wash wound with dial antibacterial soap and water prior to dressing change. Wound Cleanser Discharge  Instruction: Cleanse the wound with wound cleanser prior to applying a clean dressing using gauze sponges, not tissue or cotton balls. Peri-Wound Care Triamcinolone 15 (g) Discharge Instruction: Use triamcinolone 15 (g) as directed Sween Lotion (Moisturizing lotion) Discharge Instruction: Apply moisturizing lotion as directed Topical Mupirocin Ointment Discharge Instruction: Apply Mupirocin (Bactroban) as instructed Primary Dressing KerraCel Ag Gelling Fiber Dressing, 2x2 in (silver alginate) Discharge Instruction: Apply silver alginate to wound bed as instructed Secondary Dressing ABD Pad, 8x10 Discharge Instruction: Apply over primary dressing as directed. Woven Gauze Sponge, Non-Sterile 4x4 in Discharge Instruction: Apply over primary dressing as directed. Secured With [employer] Surgical T 2x10 (in/yd) ape Discharge Instruction: Secure with tape as directed. Compression Wrap Kerlix Roll 4.5x3.1 (in/yd) Discharge Instruction: Apply Kerlix and Coban compression as directed. Coban Self-Adherent Wrap 4x5 (in/yd) Discharge Instruction: Apply over Kerlix as directed. Compression Stockings Add-Ons Electronic Signature(s) Signed: 06/05/2022 5:17:06 PM By: Adline Peals Entered By: Adline Peals on 06/05/2022 07:57:51 -------------------------------------------------------------------------------- Wound Assessment Details Patient Name: Date of Service: [name] 06/05/2022 7:45 A M Medical Record Number: [id number] Patient Account Number: [id number] Date of Birth/Sex: Treating RN: 1951/12/06 (71 y.o. [name] Primary Care Brax Walen: [name] Other Clinician: Referring Sundeep Cary: Treating Zavior Thomason/Extender: [name] Weeks in Treatment: 7 Wound Status Wound Number: 4 Primary Diabetic Wound/Ulcer of the Lower Extremity Etiology: Wound Location: Left, Lateral Lower Leg Wound Open Wounding Event:  Trauma Status: Date Acquired: 04/10/2022 Comorbid Cataracts, Arrhythmia, Coronary Artery Disease, Hypertension, Weeks Of Treatment: 7 History: Myocardial Infarction, Peripheral Arterial Disease, Peripheral Venous Clustered Wound: No Disease, Type II Diabetes, Osteoarthritis, Neuropathy, Received Radiation Photos Wound Measurements Length: (cm) Width: (cm) Depth: (cm) Area: (cm) Volume: (cm) 0 % Reduction in Area: 100% 0 % Reduction in Volume: 100% 0 Epithelialization: Large (67-100%) 0 Tunneling: No 0 Undermining: No Wound Description Classification: Grade 1 Wound Margin: Flat and Intact Exudate Amount: None Present Foul Odor After Cleansing: No Slough/Fibrino No Wound Bed Granulation Amount: None Present (0%) Exposed Structure Necrotic Amount: None Present (0%) Fascia Exposed: No Fat  Layer (Subcutaneous Tissue) Exposed: No Tendon Exposed: No Muscle Exposed: No Joint Exposed: No Bone Exposed: No Electronic Signature(s) Signed: 06/05/2022 5:17:06 PM By: Adline Peals Entered By: Adline Peals on 06/05/2022 07:58:21 -------------------------------------------------------------------------------- [place] Details Patient Name: Date of Service: [name], Marcus [name] 06/05/2022 7:45 A M Medical Record Number: [id number] Patient Account Number: [id number] Date of Birth/Sex: Treating RN: February 18, 1951 (71 y.o. [name] Primary Care Curstin Schmale: [name] Other Clinician: Referring Madden Piazza: Treating Ballard Budney/Extender: [name] Weeks in Treatment: 7 Vital Signs Time Taken: 07:49 Temperature (F): 97.5 Height (in): 70 Pulse (bpm): 73 Weight (lbs): 219 Respiratory Rate (breaths/min): 16 Body Mass Index (BMI): 31.4 Blood Pressure (mmHg): 129/78 Reference Range: 80 - 120 mg / dl Electronic Signature(s) Signed: 06/05/2022 5:17:06 PM By: Adline Peals Entered By: Adline Peals on 06/05/2022 07:49:44

## 2022-06-06 NOTE — Progress Notes (Signed)
RUDOLPH, DAOUST ([id number]) . Visit Report for 04/24/2022 Arrival Information Details Patient Name: Date of Service: [name] 04/24/2022 9:30 A M Medical Record Number: [id number] Patient Account Number: [id number] Date of Birth/Sex: Treating RN: June 17, 1951 (71 y.o. [name] Primary Care Teyonna Plaisted: [name] Other Clinician: Referring Oniel Meleski: Treating Lianah Peed/Extender: Casandra Doffing in Treatment: 1 Visit Information History Since Last Visit Added or deleted any medications: No Patient Arrived: Ambulatory Any new allergies or adverse reactions: No Arrival Time: 09:00 Had a fall or experienced change in No Accompanied By: self activities of daily living that may affect Transfer Assistance: None risk of falls: Patient Identification Verified: Yes Signs or symptoms of abuse/neglect since last visito No Secondary Verification Process Completed: Yes Hospitalized since last visit: No Patient Requires Transmission-Based Precautions: No Implantable device outside of the clinic excluding No Patient Has Alerts: Yes cellular tissue based products placed in the center Patient Alerts: Patient on Blood Thinner since last visit: ABI R 1.11 Has Dressing in Place as Prescribed: Yes ABI L 0.72 Has Compression in Place as Prescribed: Yes TBI L 0.33 Pain Present Now: Yes Electronic Signature(s) Signed: 06/06/2022 8:46:54 AM By: Erenest Blank Entered By: Erenest Blank on 04/24/2022 09:01:24 -------------------------------------------------------------------------------- Encounter Discharge Information Details Patient Name: Date of Service: [name], [name] 04/24/2022 9:30 A M Medical Record Number: [id number] Patient Account Number: [id number] Date of Birth/Sex: Treating RN: April 19, 1951 (71 y.o. [name] Primary Care Shahid Flori: [name] Other Clinician: Referring Melizza Kanode: Treating Azzam Mehra/Extender: Casandra Doffing in Treatment: 1 Encounter Discharge Information Items Post Procedure Vitals Discharge Condition: Stable Temperature (F): 97.5 Ambulatory Status: Ambulatory Pulse (bpm): 71 Discharge Destination: Home Respiratory Rate (breaths/min): 18 Transportation: Private Auto Blood Pressure (mmHg): 125/81 Accompanied By: self Schedule Follow-up Appointment: Yes Clinical Summary of Care: Patient Declined Electronic Signature(s) Signed: 04/24/2022 5:35:09 PM By: Dellie Catholic RN Entered By: Dellie Catholic on 04/24/2022 17:34:42 -------------------------------------------------------------------------------- Lower Extremity Assessment Details Patient Name: Date of Service: [name] 04/24/2022 9:30 A M Medical Record Number: [id number] Patient Account Number: [id number] Date of Birth/Sex: Treating RN: Dec 31, 1950 (71 y.o. [name] Primary Care Angelin Cutrone: [name] Other Clinician: Referring Nate Common: Treating Aldine Chakraborty/Extender: [name] Weeks in Treatment: 1 Edema Assessment Assessed: [Left: No] [Right: No] E[Left: dema] [Right: :] Calf Left: Right: Point of Measurement: 34 cm From Medial Instep 37.5 cm Ankle Left: Right: Point of Measurement: 12 cm From Medial Instep 25.4 cm Vascular Assessment Pulses: Dorsalis Pedis Palpable: [Left:Yes] Electronic Signature(s) Signed: 04/24/2022 5:35:09 PM By: Dellie Catholic RN Signed: 06/06/2022 8:46:54 AM By: Erenest Blank Entered By: Erenest Blank on 04/24/2022 09:08:21 -------------------------------------------------------------------------------- Multi Wound Chart Details Patient Name: Date of Service: [name], [name] 04/24/2022 9:30 A M Medical Record Number: [id number] Patient Account Number: [id number] Date of Birth/Sex: Treating RN: January 30, 1951 (71 y.o. [name] Primary Care Lerin Jech: [name] Other Clinician: Referring  Zoe Goonan: Treating Aditya Nastasi/Extender: [name] Weeks in Treatment: 1 Vital Signs Height(in): 70 Pulse(bpm): 49 Weight(lbs): 219 Blood Pressure(mmHg): 125/81 Body Mass Index(BMI): 31.4 Temperature(F): 97.5 Respiratory Rate(breaths/min): 18 Photos: [N/A:N/A] Left, Anterior Lower Leg Left, Lateral Lower Leg N/A Wound Location: Trauma Trauma N/A Wounding Event: Diabetic Wound/Ulcer of the Lower Diabetic Wound/Ulcer of the Lower N/A Primary Etiology: Extremity Extremity Cataracts, Arrhythmia, Coronary Cataracts, Arrhythmia, Coronary N/A Comorbid History: Artery Disease, Hypertension, Artery Disease, Hypertension, Myocardial Infarction, Peripheral Myocardial Infarction, Peripheral Arterial Disease, Peripheral Venous Arterial  Disease, Peripheral Venous Disease, Type II Diabetes, Disease, Type II Diabetes, Osteoarthritis, Neuropathy, Received Osteoarthritis, Neuropathy, Received Radiation Radiation 04/10/2022 04/10/2022 N/A Date Acquired: 1 1 N/A Weeks of Treatment: Open Open N/A Wound Status: No No N/A Wound Recurrence: 3.8x2.5x0.1 1.7x1.8x0.1 N/A Measurements L x W x D (cm) 7.461 2.403 N/A A (cm) : rea 0.746 0.24 N/A Volume (cm) : -251.80% -628.20% N/A % Reduction in A rea: -251.90% -627.30% N/A % Reduction in Volume: Grade 1 Grade 1 N/A Classification: Medium Medium N/A Exudate A mount: Serosanguineous Serosanguineous N/A Exudate Type: red, brown red, brown N/A Exudate Color: Flat and Intact Flat and Intact N/A Wound Margin: Medium (34-66%) Large (67-100%) N/A Granulation A mount: Red, Friable Red N/A Granulation Quality: Medium (34-66%) Small (1-33%) N/A Necrotic A mount: Adherent Slough Eschar, Adherent Slough N/A Necrotic Tissue: Fat Layer (Subcutaneous Tissue): Yes Fat Layer (Subcutaneous Tissue): Yes N/A Exposed Structures: Fascia: No Fascia: No Tendon: No Tendon: No Muscle: No Muscle: No Joint: No Joint: No Bone:  No Bone: No Small (1-33%) Small (1-33%) N/A Epithelialization: Treatment Notes Electronic Signature(s) Signed: 04/24/2022 9:39:55 AM By: Fredirick Maudlin MD FACS Signed: 04/24/2022 5:35:09 PM By: Dellie Catholic RN Entered By: Fredirick Maudlin on 04/24/2022 09:39:55 -------------------------------------------------------------------------------- Multi-Disciplinary Care Plan Details Patient Name: Date of Service: [name], [name] 04/24/2022 9:30 A M Medical Record Number: [id number] Patient Account Number: [id number] Date of Birth/Sex: Treating RN: 1951/04/07 (71 y.o. [name] Primary Care Halton Neas: [name] Other Clinician: Referring Tuyet Bader: Treating Jalaiya Oyster/Extender: [name] Weeks in Treatment: 1 Active Inactive Wound/Skin Impairment Nursing Diagnoses: Impaired tissue integrity Goals: Patient/caregiver will verbalize understanding of skin care regimen Date Initiated: 04/17/2022 Target Resolution Date: 05/19/2022 Goal Status: Active Interventions: Assess ulceration(s) every visit Treatment Activities: Skin care regimen initiated : 04/17/2022 Notes: Electronic Signature(s) Signed: 04/24/2022 5:35:09 PM By: Dellie Catholic RN Entered By: Dellie Catholic on 04/24/2022 17:33:30 -------------------------------------------------------------------------------- Pain Assessment Details Patient Name: Date of Service: [name] 04/24/2022 9:30 A M Medical Record Number: [id number] Patient Account Number: [id number] Date of Birth/Sex: Treating RN: 09-16-1951 (71 y.o. [name] Primary Care Crysten Kaman: [name] Other Clinician: Referring Daksha Koone: Treating Francisco Ostrovsky/Extender: [name] Weeks in Treatment: 1 Active Problems Location of Pain Severity and Description of Pain Patient Has Paino Yes Site Locations Pain Location: Pain in Ulcers Duration of the Pain. Constant / Intermittento  Intermittent Rate the pain. Current Pain Level: 0 Character of Pain Describe the Pain: Sharp, Shooting Pain Management and Medication Current Pain Management: Rest: Yes How does your wound impact your activities of daily livingo Sleep: Yes Bathing: No Appetite: No Relationship With Others: No Bladder Continence: No Emotions: No Bowel Continence: No Work: No Toileting: No Drive: No Dressing: No Hobbies: No Electronic Signature(s) Signed: 04/24/2022 5:35:09 PM By: Dellie Catholic RN Signed: 06/06/2022 8:46:54 AM By: Erenest Blank Entered By: Erenest Blank on 04/24/2022 09:03:52 -------------------------------------------------------------------------------- Patient/Caregiver Education Details Patient Name: Date of Service: [name] 5/1/2023andnbsp9:30 A M Medical Record Number: [id number] Patient Account Number: [id number] Date of Birth/Gender: Treating RN: 1951-04-23 (71 y.o. [name] Primary Care Physician: [name] Other Clinician: Referring Physician: Treating Physician/Extender: Casandra Doffing in Treatment: 1 Education Assessment Education Provided To: Patient Education Topics Provided Wound/Skin Impairment: Methods: Explain/Verbal Responses: Return demonstration correctly Electronic Signature(s) Signed: 04/24/2022 5:35:09 PM By: Dellie Catholic RN Entered By: Dellie Catholic on 04/24/2022 17:33:45 -------------------------------------------------------------------------------- Wound Assessment Details Patient Name: Date of Service: MO Jenetta Downer  DY, [name] 04/24/2022 9:30 A M Medical Record Number: [id number] Patient Account Number: [id number] Date of Birth/Sex: Treating RN: Apr 22, 1951 (71 y.o. [name] Primary Care Jaunita Mikels: [name] Other Clinician: Referring Koleson Reifsteck: Treating Madelon Welsch/Extender: [name] Weeks in Treatment: 1 Wound Status Wound Number: 3 Primary  Diabetic Wound/Ulcer of the Lower Extremity Etiology: Wound Location: Left, Anterior Lower Leg Wound Open Wounding Event: Trauma Status: Date Acquired: 04/10/2022 Comorbid Cataracts, Arrhythmia, Coronary Artery Disease, Hypertension, Weeks Of Treatment: 1 History: Myocardial Infarction, Peripheral Arterial Disease, Peripheral Venous Clustered Wound: No Disease, Type II Diabetes, Osteoarthritis, Neuropathy, Received Radiation Photos Wound Measurements Length: (cm) 3.8 Width: (cm) 2.5 Depth: (cm) 0.1 Area: (cm) 7.461 Volume: (cm) 0.746 % Reduction in Area: -251.8% % Reduction in Volume: -251.9% Epithelialization: Small (1-33%) Tunneling: No Undermining: No Wound Description Classification: Grade 1 Wound Margin: Flat and Intact Exudate Amount: Medium Exudate Type: Serosanguineous Exudate Color: red, brown Foul Odor After Cleansing: No Slough/Fibrino Yes Wound Bed Granulation Amount: Medium (34-66%) Exposed Structure Granulation Quality: Red, Friable Fascia Exposed: No Necrotic Amount: Medium (34-66%) Fat Layer (Subcutaneous Tissue) Exposed: Yes Necrotic Quality: Adherent Slough Tendon Exposed: No Muscle Exposed: No Joint Exposed: No Bone Exposed: No Electronic Signature(s) Signed: 04/24/2022 5:35:09 PM By: Dellie Catholic RN Entered By: Dellie Catholic on 04/24/2022 09:29:19 -------------------------------------------------------------------------------- Wound Assessment Details Patient Name: Date of Service: [name] 04/24/2022 9:30 A M Medical Record Number: [id number] Patient Account Number: [id number] Date of Birth/Sex: Treating RN: 01-31-1951 (71 y.o. [name] Primary Care Siriyah Ambrosius: [name] Other Clinician: Referring Averleigh Savary: Treating Leondro Coryell/Extender: [name] Weeks in Treatment: 1 Wound Status Wound Number: 4 Primary Diabetic Wound/Ulcer of the Lower Extremity Etiology: Wound Location: Left, Lateral  Lower Leg Wound Open Wounding Event: Trauma Status: Date Acquired: 04/10/2022 Comorbid Cataracts, Arrhythmia, Coronary Artery Disease, Hypertension, Weeks Of Treatment: 1 History: Myocardial Infarction, Peripheral Arterial Disease, Peripheral Venous Clustered Wound: No Disease, Type II Diabetes, Osteoarthritis, Neuropathy, Received Radiation Photos Wound Measurements Length: (cm) 1.7 Width: (cm) 1.8 Depth: (cm) 0.1 Area: (cm) 2.403 Volume: (cm) 0.24 % Reduction in Area: -628.2% % Reduction in Volume: -627.3% Epithelialization: Small (1-33%) Tunneling: No Undermining: No Wound Description Classification: Grade 1 Wound Margin: Flat and Intact Exudate Amount: Medium Exudate Type: Serosanguineous Exudate Color: red, brown Wound Bed Granulation Amount: Large (67-100%) Granulation Quality: Red Necrotic Amount: Small (1-33%) Necrotic Quality: Eschar, Adherent Slough Foul Odor After Cleansing: No Slough/Fibrino Yes Exposed Structure Fascia Exposed: No Fat Layer (Subcutaneous Tissue) Exposed: Yes Tendon Exposed: No Muscle Exposed: No Joint Exposed: No Bone Exposed: No Electronic Signature(s) Signed: 04/24/2022 5:35:09 PM By: Dellie Catholic RN Entered By: Dellie Catholic on 04/24/2022 09:29:39 -------------------------------------------------------------------------------- Vitals Details Patient Name: Date of Service: [name], [name] 04/24/2022 9:30 A M Medical Record Number: [id number] Patient Account Number: [id number] Date of Birth/Sex: Treating RN: [birth year] (71 y.o. [name] Primary Care Lawrence Mitch: [name] Other Clinician: Referring Trayon Krantz: Treating Karanveer Ramakrishnan/Extender: [name] Weeks in Treatment: 1 Vital Signs Time Taken: 09:00 Temperature (F): 97.5 Height (in): 70 Pulse (bpm): 71 Weight (lbs): 219 Respiratory Rate (breaths/min): 18 Body Mass Index (BMI): 31.4 Blood Pressure (mmHg): 125/81 Reference Range:  80 - 120 mg / dl Electronic Signature(s) Signed: 06/06/2022 8:46:54 AM By: Erenest Blank Entered By: Erenest Blank on 04/24/2022 09:02:00

## 2022-06-08 ENCOUNTER — Other Ambulatory Visit: Payer: Self-pay | Admitting: Physician Assistant

## 2022-06-08 DIAGNOSIS — E1142 Type 2 diabetes mellitus with diabetic polyneuropathy: Secondary | ICD-10-CM

## 2022-06-08 DIAGNOSIS — E1169 Type 2 diabetes mellitus with other specified complication: Secondary | ICD-10-CM

## 2022-06-09 NOTE — Patient Instructions (Incomplete)
Diabetes Mellitus and Foot Care Foot care is an important part of your health, especially when you have diabetes. Diabetes may cause you to have problems because of poor blood flow (circulation) to your feet and legs, which can cause your skin to: Become thinner and drier. Break more easily. Heal more slowly. Peel and crack. You may also have nerve damage (neuropathy) in your legs and feet, causing decreased feeling in them. This means that you may not notice minor injuries to your feet that could lead to more serious problems. Noticing and addressing any potential problems early is the best way to prevent future foot problems. How to care for your feet Foot hygiene  Wash your feet daily with warm water and mild soap. Do not use hot water. Then, pat your feet and the areas between your toes until they are completely dry. Do not soak your feet as this can dry your skin. Trim your toenails straight across. Do not dig under them or around the cuticle. File the edges of your nails with an emery board or nail file. Apply a moisturizing lotion or petroleum jelly to the skin on your feet and to dry, brittle toenails. Use lotion that does not contain alcohol and is unscented. Do not apply lotion between your toes. Shoes and socks Wear clean socks or stockings every day. Make sure they are not too tight. Do not wear knee-high stockings since they may decrease blood flow to your legs. Wear shoes that fit properly and have enough cushioning. Always look in your shoes before you put them on to be sure there are no objects inside. To break in new shoes, wear them for just a few hours a day. This prevents injuries on your feet. Wounds, scrapes, corns, and calluses  Check your feet daily for blisters, cuts, bruises, sores, and redness. If you cannot see the bottom of your feet, use a mirror or ask someone for help. Do not cut corns or calluses or try to remove them with medicine. If you find a minor scrape,  cut, or break in the skin on your feet, keep it and the skin around it clean and dry. You may clean these areas with mild soap and water. Do not clean the area with peroxide, alcohol, or iodine. If you have a wound, scrape, corn, or callus on your foot, look at it several times a day to make sure it is healing and not infected. Check for: Redness, swelling, or pain. Fluid or blood. Warmth. Pus or a bad smell. General tips Do not cross your legs. This may decrease blood flow to your feet. Do not use heating pads or hot water bottles on your feet. They may burn your skin. If you have lost feeling in your feet or legs, you may not know this is happening until it is too late. Protect your feet from hot and cold by wearing shoes, such as at the beach or on hot pavement. Schedule a complete foot exam at least once a year (annually) or more often if you have foot problems. Report any cuts, sores, or bruises to your health care provider immediately. Where to find more information American Diabetes Association: www.diabetes.org Association of Diabetes [employer] Education Specialists: www.diabeteseducator.org Contact a health care provider if: You have a medical condition that increases your risk of infection and you have any cuts, sores, or bruises on your feet. You have an injury that is not healing. You have redness on your legs or feet. You   feel burning or tingling in your legs or feet. You have pain or cramps in your legs and feet. Your legs or feet are numb. Your feet always feel cold. You have pain around any toenails. Get help right away if: You have a wound, scrape, corn, or callus on your foot and: You have pain, swelling, or redness that gets worse. You have fluid or blood coming from the wound, scrape, corn, or callus. Your wound, scrape, corn, or callus feels warm to the touch. You have pus or a bad smell coming from the wound, scrape, corn, or callus. You have a fever. You have a red  line going up your leg. Summary Check your feet every day for blisters, cuts, bruises, sores, and redness. Apply a moisturizing lotion or petroleum jelly to the skin on your feet and to dry, brittle toenails. Wear shoes that fit properly and have enough cushioning. If you have foot problems, report any cuts, sores, or bruises to your health care provider immediately. Schedule a complete foot exam at least once a year (annually) or more often if you have foot problems. This information is not intended to replace advice given to you by your health care provider. Make sure you discuss any questions you have with your health care provider. Document Revised: 07/01/2020 Document Reviewed: 07/01/2020 [employer] Patient Education  2023 [employer]  

## 2022-06-13 ENCOUNTER — Encounter (HOSPITAL_BASED_OUTPATIENT_CLINIC_OR_DEPARTMENT_OTHER): Payer: PPO | Admitting: General Surgery

## 2022-06-13 DIAGNOSIS — L97822 Non-pressure chronic ulcer of other part of left lower leg with fat layer exposed: Secondary | ICD-10-CM | POA: Diagnosis not present

## 2022-06-13 DIAGNOSIS — L97812 Non-pressure chronic ulcer of other part of right lower leg with fat layer exposed: Secondary | ICD-10-CM | POA: Diagnosis not present

## 2022-06-13 DIAGNOSIS — E11622 Type 2 diabetes mellitus with other skin ulcer: Secondary | ICD-10-CM | POA: Diagnosis not present

## 2022-06-13 NOTE — Progress Notes (Signed)
TYQUAVIOUS, GAMEL ([id number]) . Visit Report for 06/13/2022 Arrival Information Details Patient Name: Date of Service: [name]. 06/13/2022 7:45 A M Medical Record Number: [id number] Patient Account Number: [id number] Date of Birth/Sex: Treating RN: 05/01/1951 (71 y.o. [name] Primary Care Reynoldo Mainer: [name] Other Clinician: Referring Katricia Prehn: Treating Ceara Wrightson/Extender: Casandra Doffing in Treatment: 8 Visit Information History Since Last Visit Added or deleted any medications: No Patient Arrived: Ambulatory Any new allergies or adverse reactions: No Arrival Time: 07:44 Had a fall or experienced change in No Accompanied By: self activities of daily living that may affect Transfer Assistance: None risk of falls: Patient Identification Verified: Yes Signs or symptoms of abuse/neglect since last visito No Secondary Verification Process Completed: Yes Hospitalized since last visit: No Patient Requires Transmission-Based Precautions: No Implantable device outside of the clinic excluding No Patient Has Alerts: Yes cellular tissue based products placed in the center Patient Alerts: Patient on Blood Thinner since last visit: ABI R 1.11 Has Dressing in Place as Prescribed: Yes ABI L 0.72 Has Compression in Place as Prescribed: Yes TBI L 0.33 Pain Present Now: Yes Electronic Signature(s) Signed: 06/13/2022 4:52:42 PM By: Adline Peals Entered By: Adline Peals on 06/13/2022 07:46:27 -------------------------------------------------------------------------------- Encounter Discharge Information Details Patient Name: Date of Service: [name], [name] 06/13/2022 7:45 A M Medical Record Number: [id number] Patient Account Number: [id number] Date of Birth/Sex: Treating RN: [birth year] (71 y.o. [name] Primary Care Karthika Glasper: [name] Other Clinician: Referring Wister Hoefle: Treating Jabrea Kallstrom/Extender:  Casandra Doffing in Treatment: 8 Encounter Discharge Information Items Post Procedure Vitals Discharge Condition: Stable Temperature (F): 97.6 Ambulatory Status: Ambulatory Pulse (bpm): 89 Discharge Destination: Home Respiratory Rate (breaths/min): 16 Transportation: Private Auto Blood Pressure (mmHg): 114/68 Accompanied By: self Schedule Follow-up Appointment: Yes Clinical Summary of Care: Patient Declined Electronic Signature(s) Signed: 06/13/2022 4:52:42 PM By: Adline Peals Entered By: Adline Peals on 06/13/2022 11:40:12 -------------------------------------------------------------------------------- Lower Extremity Assessment Details Patient Name: Date of Service: [name] 06/13/2022 7:45 A M Medical Record Number: [id number] Patient Account Number: [id number] Date of Birth/Sex: Treating RN: [birth year] (71 y.o. [name] Primary Care Peja Allender: [name] Other Clinician: Referring Amin Fornwalt: Treating Gwynn Chalker/Extender: [name] Weeks in Treatment: 8 Edema Assessment Assessed: [Left: No] [Right: No] E[Left: dema] [Right: :] Calf Left: Right: Point of Measurement: 34 cm From Medial Instep 36.1 cm 40.1 cm Ankle Left: Right: Point of Measurement: 12 cm From Medial Instep 25.5 cm 26.6 cm Vascular Assessment Pulses: Dorsalis Pedis Palpable: [Left:Yes] [Right:Yes] Electronic Signature(s) Signed: 06/13/2022 4:52:42 PM By: Adline Peals Entered By: Adline Peals on 06/13/2022 07:55:57 -------------------------------------------------------------------------------- Multi Wound Chart Details Patient Name: Date of Service: [name], [name] 06/13/2022 7:45 A M Medical Record Number: [id number] Patient Account Number: [id number] Date of Birth/Sex: Treating RN: 03-16-1951 (71 y.o. [name] Primary Care Mikhia Dusek: [name] Other Clinician: Referring  Alenna Russell: Treating Traci Gafford/Extender: [name] Weeks in Treatment: 8 Vital Signs Height(in): 70 Pulse(bpm): 57 Weight(lbs): 219 Blood Pressure(mmHg): 114/68 Body Mass Index(BMI): 31.4 Temperature(F): 97.6 Respiratory Rate(breaths/min): 16 Photos: [N/A:N/A] Left, Anterior Lower Leg Right Lower Leg N/A Wound Location: Trauma Trauma N/A Wounding Event: Diabetic Wound/Ulcer of the Lower Trauma, Other N/A Primary Etiology: Extremity Cataracts, Arrhythmia, Coronary Cataracts, Arrhythmia, Coronary N/A Comorbid History: Artery Disease, Hypertension, Artery Disease, Hypertension, Myocardial Infarction, Peripheral Myocardial Infarction, Peripheral Arterial Disease, Peripheral Venous Arterial Disease, Peripheral Venous Disease, Type II Diabetes, Disease, Type  II Diabetes, Osteoarthritis, Neuropathy, ReceivedOsteoarthritis, Neuropathy, Received Radiation Radiation 04/10/2022 06/05/2022 N/A Date Acquired: 8 0 N/A Weeks of Treatment: Open Open N/A Wound Status: No No N/A Wound Recurrence: 0.1x0.1x0.1 2.5x3.4x0.1 N/A Measurements L x W x D (cm) 0.008 6.676 N/A A (cm) : rea 0.001 0.668 N/A Volume (cm) : 99.60% 0.00% N/A % Reduction in A rea: 99.50% 0.00% N/A % Reduction in Volume: Grade 1 Full Thickness Without Exposed N/A Classification: Support Structures Medium Medium N/A Exudate A mount: Serosanguineous Serosanguineous N/A Exudate Type: red, brown red, brown N/A Exudate Color: Flat and Intact Distinct, outline attached N/A Wound Margin: Large (67-100%) Large (67-100%) N/A Granulation A mount: Red, Pink Red N/A Granulation Quality: None Present (0%) None Present (0%) N/A Necrotic A mount: Fat Layer (Subcutaneous Tissue): Yes Fat Layer (Subcutaneous Tissue): Yes N/A Exposed Structures: Fascia: No Fascia: No Tendon: No Tendon: No Muscle: No Muscle: No Joint: No Joint: No Bone: No Bone: No Large (67-100%) Small (1-33%)  N/A Epithelialization: Debridement - Selective/Open Wound Debridement - Excisional N/A Debridement: Pre-procedure Verification/Time Out 08:04 08:04 N/A Taken: Other Other N/A Pain Control: Necrotic/Eschar Subcutaneous N/A Tissue Debrided: Non-Viable Tissue Skin/Subcutaneous Tissue N/A Level: 1 8.5 N/A Debridement A (sq cm): rea Curette Curette N/A Instrument: Minimum Minimum N/A Bleeding: Pressure Pressure N/A Hemostasis A chieved: 0 0 N/A Procedural Pain: 0 0 N/A Post Procedural Pain: Procedure was tolerated well Procedure was tolerated well N/A Debridement Treatment Response: 0.2x0.2x0.2 2.5x3.4x0.1 N/A Post Debridement Measurements L x W x D (cm) 0.006 0.668 N/A Post Debridement Volume: (cm) Debridement Debridement N/A Procedures Performed: Treatment Notes Electronic Signature(s) Signed: 06/13/2022 8:16:06 AM By: Fredirick Maudlin MD FACS Signed: 06/13/2022 4:52:42 PM By: Adline Peals Entered By: Fredirick Maudlin on 06/13/2022 08:16:06 -------------------------------------------------------------------------------- Multi-Disciplinary Care Plan Details Patient Name: Date of Service: [name], [name] 06/13/2022 7:45 A M Medical Record Number: [id number] Patient Account Number: [id number] Date of Birth/Sex: Treating RN: 13-Mar-1951 (71 y.o. [name] Primary Care Melquan Ernsberger: [name] Other Clinician: Referring Adien Kimmel: Treating Braison Snoke/Extender: [name] Weeks in Treatment: 8 Active Inactive Wound/Skin Impairment Nursing Diagnoses: Impaired tissue integrity Goals: Patient/caregiver will verbalize understanding of skin care regimen Date Initiated: 04/17/2022 Target Resolution Date: 06/23/2022 Goal Status: Active Interventions: Assess ulceration(s) every visit Treatment Activities: Skin care regimen initiated : 04/17/2022 Notes: Electronic Signature(s) Signed: 06/13/2022 4:52:42 PM By: Adline Peals Entered By: Adline Peals on 06/13/2022 07:59:43 -------------------------------------------------------------------------------- Pain Assessment Details Patient Name: Date of Service: [name] 06/13/2022 7:45 A M Medical Record Number: [id number] Patient Account Number: [id number] Date of Birth/Sex: Treating RN: [birth year] (71 y.o. [name] Primary Care Kindsey Eblin: [name] Other Clinician: Referring Tisa Weisel: Treating Erna Brossard/Extender: [name] Weeks in Treatment: 8 Active Problems Location of Pain Severity and Description of Pain Patient Has Paino Yes Site Locations Pain Location: Pain in Ulcers Duration of the Pain. Constant / Intermittento Constant Rate the pain. Current Pain Level: 8 Character of Pain Describe the Pain: Burning Pain Management and Medication Current Pain Management: Medication: No Electronic Signature(s) Signed: 06/13/2022 4:52:42 PM By: Adline Peals Entered By: Adline Peals on 06/13/2022 07:46:58 -------------------------------------------------------------------------------- Patient/Caregiver Education Details Patient Name: Date of Service: [name] 6/20/2023andnbsp7:45 [place] Record Number: [id number] Patient Account Number: [id number] Date of Birth/Gender: Treating RN: [birth year] (71 y.o. [name] Primary Care Physician: [name] Other Clinician: Referring Physician: Treating Physician/Extender: Casandra Doffing in Treatment: 8 Education Assessment Education Provided To:  Patient Education Topics Provided Wound/Skin Impairment: Methods: Explain/Verbal Responses: Reinforcements needed, State content correctly Electronic Signature(s) Signed: 06/13/2022 4:52:42 PM By: Adline Peals Entered By: Adline Peals on 06/13/2022  07:59:57 -------------------------------------------------------------------------------- Wound Assessment Details Patient Name: Date of Service: [name] 06/13/2022 7:45 A M Medical Record Number: [id number] Patient Account Number: [id number] Date of Birth/Sex: Treating RN: 03/05/1951 (71 y.o. [name] Primary Care Jamine Wingate: [name] Other Clinician: Referring Christorpher Hisaw: Treating Matalie Romberger/Extender: [name] Weeks in Treatment: 8 Wound Status Wound Number: 3 Primary Diabetic Wound/Ulcer of the Lower Extremity Etiology: Wound Location: Left, Anterior Lower Leg Wound Open Wounding Event: Trauma Status: Date Acquired: 04/10/2022 Comorbid Cataracts, Arrhythmia, Coronary Artery Disease, Hypertension, Weeks Of Treatment: 8 History: Myocardial Infarction, Peripheral Arterial Disease, Peripheral Venous Clustered Wound: No Disease, Type II Diabetes, Osteoarthritis, Neuropathy, Received Radiation Photos Wound Measurements Length: (cm) 0.1 Width: (cm) 0.1 Depth: (cm) 0.1 Area: (cm) 0.008 Volume: (cm) 0.001 % Reduction in Area: 99.6% % Reduction in Volume: 99.5% Epithelialization: Large (67-100%) Tunneling: No Undermining: No Wound Description Classification: Grade 1 Wound Margin: Flat and Intact Exudate Amount: Medium Exudate Type: Serosanguineous Exudate Color: red, brown Foul Odor After Cleansing: No Slough/Fibrino No Wound Bed Granulation Amount: Large (67-100%) Exposed Structure Granulation Quality: Red, Pink Fascia Exposed: No Necrotic Amount: None Present (0%) Fat Layer (Subcutaneous Tissue) Exposed: Yes Tendon Exposed: No Muscle Exposed: No Joint Exposed: No Bone Exposed: No Treatment Notes Wound #3 (Lower Leg) Wound Laterality: Left, Anterior Cleanser Soap and Water Discharge Instruction: May shower and wash wound with dial antibacterial soap and water prior to dressing change. Wound Cleanser Discharge  Instruction: Cleanse the wound with wound cleanser prior to applying a clean dressing using gauze sponges, not tissue or cotton balls. Peri-Wound Care Triamcinolone 15 (g) Discharge Instruction: Use triamcinolone 15 (g) as directed Sween Lotion (Moisturizing lotion) Discharge Instruction: Apply moisturizing lotion as directed Topical Mupirocin Ointment Discharge Instruction: Apply Mupirocin (Bactroban) as instructed Primary Dressing KerraCel Ag Gelling Fiber Dressing, 2x2 in (silver alginate) Discharge Instruction: Apply silver alginate to wound bed as instructed Secondary Dressing Woven Gauze Sponge, Non-Sterile 4x4 in Discharge Instruction: Apply over primary dressing as directed. Secured With [employer] Surgical T 2x10 (in/yd) ape Discharge Instruction: Secure with tape as directed. Compression Wrap Kerlix Roll 4.5x3.1 (in/yd) Discharge Instruction: Apply Kerlix and Coban compression as directed. Coban Self-Adherent Wrap 4x5 (in/yd) Discharge Instruction: Apply over Kerlix as directed. Compression Stockings Add-Ons Electronic Signature(s) Signed: 06/13/2022 4:52:42 PM By: Adline Peals Entered By: Adline Peals on 06/13/2022 07:59:02 -------------------------------------------------------------------------------- Wound Assessment Details Patient Name: Date of Service: [name] 06/13/2022 7:45 A M Medical Record Number: [id number] Patient Account Number: [id number] Date of Birth/Sex: Treating RN: March 02, 1951 (71 y.o. [name] Primary Care Ariyanah Aguado: [name] Other Clinician: Referring Tripp Goins: Treating Baruc Tugwell/Extender: [name] Weeks in Treatment: 8 Wound Status Wound Number: 5 Primary Trauma, Other Etiology: Wound Location: Right Lower Leg Wound Open Wounding Event: Trauma Status: Date Acquired: 06/05/2022 Comorbid Cataracts, Arrhythmia, Coronary Artery Disease, Hypertension, Weeks Of  Treatment: 0 History: Myocardial Infarction, Peripheral Arterial Disease, Peripheral Venous Clustered Wound: No Disease, Type II Diabetes, Osteoarthritis, Neuropathy, Received Radiation Photos Wound Measurements Length: (cm) 2.5 Width: (cm) 3.4 Depth: (cm) 0.1 Area: (cm) 6.676 Volume: (cm) 0.668 % Reduction in Area: 0% % Reduction in Volume: 0% Epithelialization: Small (1-33%) Tunneling: No Undermining: No Wound Description Classification: Full Thickness Without Exposed Support Structures Wound Margin: Distinct, outline attached Exudate Amount: Medium  Exudate Type: Serosanguineous Exudate Color: red, brown Foul Odor After Cleansing: No Slough/Fibrino No Wound Bed Granulation Amount: Large (67-100%) Exposed Structure Granulation Quality: Red Fascia Exposed: No Necrotic Amount: None Present (0%) Fat Layer (Subcutaneous Tissue) Exposed: Yes Tendon Exposed: No Muscle Exposed: No Joint Exposed: No Bone Exposed: No Treatment Notes Wound #5 (Lower Leg) Wound Laterality: Right Cleanser Soap and Water Discharge Instruction: May shower and wash wound with dial antibacterial soap and water prior to dressing change. Wound Cleanser Discharge Instruction: Cleanse the wound with wound cleanser prior to applying a clean dressing using gauze sponges, not tissue or cotton balls. Peri-Wound Care Triamcinolone 15 (g) Discharge Instruction: Use triamcinolone 15 (g) as directed Sween Lotion (Moisturizing lotion) Discharge Instruction: Apply moisturizing lotion as directed Topical Mupirocin Ointment Discharge Instruction: Apply Mupirocin (Bactroban) as instructed Primary Dressing KerraCel Ag Gelling Fiber Dressing, 2x2 in (silver alginate) Discharge Instruction: Apply silver alginate to wound bed as instructed Secondary Dressing Woven Gauze Sponge, Non-Sterile 4x4 in Discharge Instruction: Apply over primary dressing as directed. Secured With [employer] Surgical T 2x10  (in/yd) ape Discharge Instruction: Secure with tape as directed. Compression Wrap Kerlix Roll 4.5x3.1 (in/yd) Discharge Instruction: Apply Kerlix and Coban compression as directed. Coban Self-Adherent Wrap 4x5 (in/yd) Discharge Instruction: Apply over Kerlix as directed. Compression Stockings Add-Ons Electronic Signature(s) Signed: 06/13/2022 4:52:42 PM By: Adline Peals Entered By: Adline Peals on 06/13/2022 07:59:20 -------------------------------------------------------------------------------- Vitals Details Patient Name: Date of Service: [name], [name] 06/13/2022 7:45 A M Medical Record Number: [id number] Patient Account Number: [id number] Date of Birth/Sex: Treating RN: Sep 27, 1951 (71 y.o. [name] Primary Care Dorisann Schwanke: [name] Other Clinician: Referring Donald Memoli: Treating Dorota Heinrichs/Extender: [name] Weeks in Treatment: 8 Vital Signs Time Taken: 07:47 Temperature (F): 97.6 Height (in): 70 Pulse (bpm): 89 Weight (lbs): 219 Respiratory Rate (breaths/min): 16 Body Mass Index (BMI): 31.4 Blood Pressure (mmHg): 114/68 Reference Range: 80 - 120 mg / dl Electronic Signature(s) Signed: 06/13/2022 4:52:42 PM By: Adline Peals Entered By: Adline Peals on 06/13/2022 07:47:17

## 2022-06-13 NOTE — Progress Notes (Signed)
[name], [name] ([id number]) . Visit Report for 06/13/2022 Chief Complaint Document Details Patient Name: Date of Service: [name]. 06/13/2022 7:45 A M Medical Record Number: [id number] Patient Account Number: [id number] Date of Birth/Sex: Treating RN: October 22, 1951 (71 y.o. [name] Primary Care Provider: [name] Other Clinician: Referring Provider: Treating Provider/Extender: Casandra Doffing in Treatment: 8 Information Obtained from: Patient Chief Complaint Patient seen for complaints of Non-Healing Wounds. Electronic Signature(s) Signed: 06/13/2022 8:16:14 AM By: Marcus Maudlin MD FACS Entered By: [name] on 06/13/2022 08:16:14 -------------------------------------------------------------------------------- Debridement Details Patient Name: Date of Service: [name], Marcus [name] 06/13/2022 7:45 A M Medical Record Number: [id number] Patient Account Number: [id number] Date of Birth/Sex: Treating RN: Jul 04, 1951 (71 y.o. [name] Primary Care Provider: [name] Other Clinician: Referring Provider: Treating Provider/Extender: Casandra Doffing in Treatment: 8 Debridement Performed for Assessment: Wound #3 Left,Anterior Lower Leg Performed By: Physician Marcus Maudlin, MD Debridement Type: Debridement Severity of Tissue Pre Debridement: Fat layer exposed Level of Consciousness (Pre-procedure): Awake and Alert Pre-procedure Verification/Time Out Yes - 08:04 Taken: Start Time: 08:04 Pain Control: Other : benzocaine 20% spray T Area Debrided (L x W): otal 1 (cm) x 1 (cm) = 1 (cm) Tissue and other material debrided: Non-Viable, Eschar Level: Non-Viable Tissue Debridement Description: Selective/Open Wound Instrument: Curette Bleeding: Minimum Hemostasis Achieved: Pressure Procedural Pain: 0 Post Procedural Pain: 0 Response to Treatment: Procedure was tolerated well Level of  Consciousness (Post- Awake and Alert procedure): Post Debridement Measurements of Total Wound Length: (cm) 0.2 Width: (cm) 0.2 Depth: (cm) 0.2 Volume: (cm) 0.006 Character of Wound/Ulcer Post Debridement: Improved Severity of Tissue Post Debridement: Fat layer exposed Post Procedure Diagnosis Same as Pre-procedure Electronic Signature(s) Signed: 06/13/2022 11:12:49 AM By: Marcus Maudlin MD FACS Signed: 06/13/2022 4:52:42 PM By: Adline Peals Entered By: Adline Peals on 06/13/2022 08:05:05 -------------------------------------------------------------------------------- Debridement Details Patient Name: Date of Service: [name], Marcus [name] 06/13/2022 7:45 A M Medical Record Number: [id number] Patient Account Number: [id number] Date of Birth/Sex: Treating RN: [birth year] (71 y.o. [name] Primary Care Provider: [name] Other Clinician: Referring Provider: Treating Provider/Extender: Casandra Doffing in Treatment: 8 Debridement Performed for Assessment: Wound #5 Right Lower Leg Performed By: Physician Marcus Maudlin, MD Debridement Type: Debridement Level of Consciousness (Pre-procedure): Awake and Alert Pre-procedure Verification/Time Out Yes - 08:04 Taken: Start Time: 08:04 Pain Control: Other : benzocaine 20% spray T Area Debrided (L x W): otal 2.5 (cm) x 3.4 (cm) = 8.5 (cm) Tissue and other material debrided: Viable, Non-Viable, Subcutaneous, Skin: Epidermis Level: Skin/Subcutaneous Tissue Debridement Description: Excisional Instrument: Curette Bleeding: Minimum Hemostasis Achieved: Pressure Procedural Pain: 0 Post Procedural Pain: 0 Response to Treatment: Procedure was tolerated well Level of Consciousness (Post- Awake and Alert procedure): Post Debridement Measurements of Total Wound Length: (cm) 2.5 Width: (cm) 3.4 Depth: (cm) 0.1 Volume: (cm) 0.668 Character of Wound/Ulcer Post Debridement:  Improved Post Procedure Diagnosis Same as Pre-procedure Electronic Signature(s) Signed: 06/13/2022 11:12:49 AM By: Marcus Maudlin MD FACS Signed: 06/13/2022 4:52:42 PM By: Adline Peals Entered By: Adline Peals on 06/13/2022 08:08:22 -------------------------------------------------------------------------------- HPI Details Patient Name: Date of Service: [name], Marcus [name] 06/13/2022 7:45 A M Medical Record Number: [id number] Patient Account Number: [id number] Date of Birth/Sex: Treating RN: September 12, 1951 (71 y.o. [name] Primary Care Provider: [name] Other Clinician: Referring Provider: Treating Provider/Extender: [name] Weeks in Treatment: 8 History of Present Illness HPI  Description: 11/10/2020 patient presents today for initial evaluation in this clinic although I have seen this patient in [place] previous. Subsequently his issue today is different from when I saw him for I was treating him for a toe ulcer. Currently he is having issues with bilateral lower extremity ulcers after running into a metal bar. Upon inspection today he has wounds of the bilateral lower extremities which are consistent with having struck his legs on the anterior aspect. With that being said the patient did go to the ER for evaluation on October 11 where he was given Keflex initially. He went back to the ER after not improving on October 20 he was given Lasix at that point he states the Lasix seem to help the most. Has been using Neosporin and leaving this open area which is probably not the best way to go either to be honest. He has had arterial studies on March 2021 which showed a left ABI of 0.78 with a TBI of 0.34 and a right ABI of 1.18 with a TBI 1.18. Nonetheless his arterial flow the left is not ideal but also I think potentially consistent with allowing this to heal. I think he can also support light compression with Kerlix and Coban based  on this result. His most recent hemoglobin A1c that we could find was 7.3 and that was towards the end of 2020. The patient does have a history of heart disease unfortunately. He is also currently still a smoker. 11/24/2020 on evaluation today patient appears to be doing well with regard to his wounds. In fact both appear to be doing better after last week's rather last visit's debridement. Fortunately there is no signs of active infection at this time. No fevers, chills, nausea, vomiting, or diarrhea. Overall I am very pleased with where things stand today. 12/01/2020 on evaluation today patient appears to be doing well with regard to his leg ulcers. He has been tolerating the dressing changes without complication. Fortunately there is no signs of active infection at this time. No fevers, chills, nausea, vomiting, or diarrhea. 12/08/2020 on evaluation today patient appears to be doing very well in regard to his bilateral lower extremity ulcers. They seem to be doing excellent and he is making great progress. There does not appear to be any evidence of active infection at this time. No fevers, chills, nausea, vomiting, or diarrhea. 12/15/2020 upon evaluation today patient actually appears to be doing excellent in regard to his wounds. Has been tolerating the dressing changes without complication. Fortunately his wounds are showing signs of great improvement were using Hydrofera Blue. 12/29/2020 on evaluation today patient appears to be doing well with regard to his right leg which is completely healed. His left leg is also measuring smaller than not healed this is doing very well. Fortunately there is no signs of active infection at this time 01/05/2021 upon evaluation today patient actually appears to be making signs of improvement with regard to his left leg. I am very pleased with how things are progressing. There is no evidence of active infection at this time. No fevers, chills, nausea, vomiting, or  diarrhea. Patient is extremely happy with the fact that this is doing so well 01/12/2021 upon evaluation today patient appears to be doing excellent in regard to his leg ulcer. Fortunately there is no signs of active infection at this time. No fevers, chills, nausea, vomiting, or diarrhea. He has been tolerating the dressing changes without complication. 01/19/2021 upon evaluation today patient appears to be  doing well with regard to his wound. In fact this appears to be completely healed on the left leg. This is brand-new skin but overall seems to be doing quite well which is great news. No fevers, chills, nausea, vomiting, or diarrhea. READMISSION 04/17/2022 This is a patient has been seen on a number of occasions in the wound care center. He is 71 years old and has type 2 diabetes mellitus, coronary artery disease, chronic venous insufficiency, and peripheral arterial disease with recent vascular studies demonstrating a right TBI of 0.9 (normal) and a left great toe TBI of 0.33. 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 He recently dropped an object and it hit him in the left shin. He then bumped the same leg against his wagon. He now has 2 wounds on his left anterior lower leg. He says that he applied Aquaphor to keep the areas moist and made an appointment in the wound care center for follow-up. 04/24/2022: Today, he is here with what he describes as 10 out of 10 pain in his wounds. Both of them measured a little bit larger today, but this may be secondary to the debridement that was performed last week. He says that due to it being spring season, he is on his feet quite a bit more. The leg is red and warm. We have been using silver alginate with Kerlix and Coban wraps. 05/01/2022: PCR culture taken last week grew out MRSA. I prescribed doxycycline. He has been taking this. Both wounds are a bit smaller today and somewhat less painful. They continue to accumulate a bit of  slough. 05/08/2022: He has completed his course of oral doxycycline. We have been using mupirocin under silver alginate with Kerlix and Coban wrapping. Today, both wounds are a bit smaller but have accumulated slough on the surface. They are less tender. 05/23/2022: Both wounds are quite a bit more shallow today. There is a little bit of slough accumulation, but there is also good granulation tissue as well as some buds of epithelialized tissue coming through the wound. No surrounding erythema, induration, nor any purulent drainage. We have been using mupirocin with silver alginate and Kerlix/Coban wrapping. 05/29/2022: The more anterior wound continues to demonstrate encroaching epithelium and the remaining open portion has good granulation tissue. The more lateral wound is nearly closed with just a pinpoint opening. No concern for infection. 06/05/2022: The lateral wound has healed. The more anterior wound is much smaller today with just a little bit of surrounding eschar. The wound surface is healthy-appearing with good granulation tissue. 06/13/2022: The wound on the anterior tibial surface on the left is nearly closed. There is just a tiny opening underneath some eschar. Unfortunately, his dog jumped up on him and cut his right lower leg. There is a flap of nonviable skin with some hematoma and bruising. No surrounding erythema or induration. No purulent discharge. Electronic Signature(s) Signed: 06/13/2022 8:17:22 AM By: Marcus Maudlin MD FACS Entered By: [name] on 06/13/2022 08:17:22 -------------------------------------------------------------------------------- Physical Exam Details Patient Name: Date of Service: [name] 06/13/2022 7:45 A M Medical Record Number: [id number] Patient Account Number: [id number] Date of Birth/Sex: Treating RN: 07-07-1951 (71 y.o. [name] Primary Care Provider: [name] Other Clinician: Referring Provider: Treating  Provider/Extender: [name] Weeks in Treatment: 8 Constitutional . . . . No acute distress.Marland Kitchen Respiratory Normal work of breathing on room air.. Notes 06/13/2022: The wound on the anterior tibial surface on the left is  nearly closed. There is just a tiny opening underneath some eschar. Unfortunately, his dog jumped up on him and cut his right lower leg. There is a flap of nonviable skin with some hematoma and bruising. No surrounding erythema or induration. No purulent discharge. Electronic Signature(s) Signed: 06/13/2022 8:17:46 AM By: Marcus Maudlin MD FACS Entered By: [name] on 06/13/2022 08:17:46 -------------------------------------------------------------------------------- Physician Orders Details Patient Name: Date of Service: [name], Marcus [name] 06/13/2022 7:45 A M Medical Record Number: [id number] Patient Account Number: [id number] Date of Birth/Sex: Treating RN: [birth year] (71 y.o. [name] Primary Care Provider: [name] Other Clinician: Referring Provider: Treating Provider/Extender: Casandra Doffing in Treatment: 8 Verbal / Phone Orders: No Diagnosis Coding ICD-10 Coding Code Description [phone] Non-pressure chronic ulcer of other part of left lower leg with fat layer exposed E11.622 Type 2 diabetes mellitus with other skin ulcer I73.9 Peripheral vascular disease, unspecified I87.2 Venous insufficiency (chronic) (peripheral) L97.812 Non-pressure chronic ulcer of other part of right lower leg with fat layer exposed Follow-up Appointments ppointment in 1 week. - Dr Celine Ahr - Room 2 - 6/ Return A Bathing/ Shower/ Hygiene May shower with protection but do not get wound dressing(s) wet. - Please do not get Left Leg wet/damp. Edema Control - Lymphedema / SCD / Other Bilateral Lower Extremities Avoid standing for long periods of time. Exercise regularly Moisturize legs daily. Wound  Treatment Wound #3 - Lower Leg Wound Laterality: Left, Anterior Cleanser: Soap and Water 1 x Per Week/30 Days Discharge Instructions: May shower and wash wound with dial antibacterial soap and water prior to dressing change. Cleanser: Wound Cleanser 1 x Per Week/30 Days Discharge Instructions: Cleanse the wound with wound cleanser prior to applying a clean dressing using gauze sponges, not tissue or cotton balls. Peri-Wound Care: Triamcinolone 15 (g) 1 x Per Week/30 Days Discharge Instructions: Use triamcinolone 15 (g) as directed Peri-Wound Care: Sween Lotion (Moisturizing lotion) 1 x Per Week/30 Days Discharge Instructions: Apply moisturizing lotion as directed Topical: Mupirocin Ointment 1 x Per Week/30 Days Discharge Instructions: Apply Mupirocin (Bactroban) as instructed Prim Dressing: KerraCel Ag Gelling Fiber Dressing, 2x2 in (silver alginate) 1 x Per Week/30 Days ary Discharge Instructions: Apply silver alginate to wound bed as instructed Secondary Dressing: Woven Gauze Sponge, Non-Sterile 4x4 in 1 x Per Week/30 Days Discharge Instructions: Apply over primary dressing as directed. Secured With: 61M Medipore Public affairs consultant Surgical T 2x10 (in/yd) 1 x Per Week/30 Days ape Discharge Instructions: Secure with tape as directed. Compression Wrap: Kerlix Roll 4.5x3.1 (in/yd) 1 x Per Week/30 Days Discharge Instructions: Apply Kerlix and Coban compression as directed. Compression Wrap: Coban Self-Adherent Wrap 4x5 (in/yd) 1 x Per Week/30 Days Discharge Instructions: Apply over Kerlix as directed. Wound #5 - Lower Leg Wound Laterality: Right Cleanser: Soap and Water 1 x Per Week/30 Days Discharge Instructions: May shower and wash wound with dial antibacterial soap and water prior to dressing change. Cleanser: Wound Cleanser 1 x Per Week/30 Days Discharge Instructions: Cleanse the wound with wound cleanser prior to applying a clean dressing using gauze sponges, not tissue or cotton  balls. Peri-Wound Care: Triamcinolone 15 (g) 1 x Per Week/30 Days Discharge Instructions: Use triamcinolone 15 (g) as directed Peri-Wound Care: Sween Lotion (Moisturizing lotion) 1 x Per Week/30 Days Discharge Instructions: Apply moisturizing lotion as directed Topical: Mupirocin Ointment 1 x Per Week/30 Days Discharge Instructions: Apply Mupirocin (Bactroban) as instructed Prim Dressing: KerraCel Ag Gelling Fiber Dressing, 2x2 in (silver alginate) 1 x Per Week/30  Days ary Discharge Instructions: Apply silver alginate to wound bed as instructed Secondary Dressing: Woven Gauze Sponge, Non-Sterile 4x4 in 1 x Per Week/30 Days Discharge Instructions: Apply over primary dressing as directed. Secured With: 60M Medipore Public affairs consultant Surgical T 2x10 (in/yd) 1 x Per Week/30 Days ape Discharge Instructions: Secure with tape as directed. Compression Wrap: Kerlix Roll 4.5x3.1 (in/yd) 1 x Per Week/30 Days Discharge Instructions: Apply Kerlix and Coban compression as directed. Compression Wrap: Coban Self-Adherent Wrap 4x5 (in/yd) 1 x Per Week/30 Days Discharge Instructions: Apply over Kerlix as directed. Electronic Signature(s) Signed: 06/13/2022 11:12:49 AM By: Marcus Maudlin MD FACS Entered By: [name] on 06/13/2022 08:17:59 -------------------------------------------------------------------------------- Problem List Details Patient Name: Date of Service: [name], Marcus [name] 06/13/2022 7:45 A M Medical Record Number: [id number] Patient Account Number: [id number] Date of Birth/Sex: Treating RN: 1951/07/06 (71 y.o. [name] Primary Care Provider: [name] Other Clinician: Referring Provider: Treating Provider/Extender: [name] Weeks in Treatment: 8 Active Problems ICD-10 Encounter Code Description Active Date MDM Diagnosis [phone] Non-pressure chronic ulcer of other part of left lower leg with fat layer exposed4/24/2023 No Yes E11.622  Type 2 diabetes mellitus with other skin ulcer 04/17/2022 No Yes I73.9 Peripheral vascular disease, unspecified 04/17/2022 No Yes I87.2 Venous insufficiency (chronic) (peripheral) 04/17/2022 No Yes L97.812 Non-pressure chronic ulcer of other part of right lower leg with fat layer 06/13/2022 No Yes exposed Inactive Problems Resolved Problems Electronic Signature(s) Signed: 06/13/2022 8:15:59 AM By: Marcus Maudlin MD FACS Entered By: [name] on 06/13/2022 08:15:59 -------------------------------------------------------------------------------- Progress Note Details Patient Name: Date of Service: [name], Marcus [name] 06/13/2022 7:45 A M Medical Record Number: [id number] Patient Account Number: [id number] Date of Birth/Sex: Treating RN: [birth year] (71 y.o. [name] Primary Care Provider: [name] Other Clinician: Referring Provider: Treating Provider/Extender: Casandra Doffing in Treatment: 8 Subjective Chief Complaint Information obtained from Patient Patient seen for complaints of Non-Healing Wounds. History of Present Illness (HPI) 11/10/2020 patient presents today for initial evaluation in this clinic although I have seen this patient in [place] previous. Subsequently his issue today is different from when I saw him for I was treating him for a toe ulcer. Currently he is having issues with bilateral lower extremity ulcers after running into a metal bar. Upon inspection today he has wounds of the bilateral lower extremities which are consistent with having struck his legs on the anterior aspect. With that being said the patient did go to the ER for evaluation on October 11 where he was given Keflex initially. He went back to the ER after not improving on October 20 he was given Lasix at that point he states the Lasix seem to help the most. Has been using Neosporin and leaving this open area which is probably not the best way to go either  to be honest. He has had arterial studies on March 2021 which showed a left ABI of 0.78 with a TBI of 0.34 and a right ABI of 1.18 with a TBI 1.18. Nonetheless his arterial flow the left is not ideal but also I think potentially consistent with allowing this to heal. I think he can also support light compression with Kerlix and Coban based on this result. His most recent hemoglobin A1c that we could find was 7.3 and that was towards the end of 2020. The patient does have a history of heart disease unfortunately. He is also currently still a smoker. 11/24/2020 on evaluation today patient  appears to be doing well with regard to his wounds. In fact both appear to be doing better after last week's rather last visit's debridement. Fortunately there is no signs of active infection at this time. No fevers, chills, nausea, vomiting, or diarrhea. Overall I am very pleased with where things stand today. 12/01/2020 on evaluation today patient appears to be doing well with regard to his leg ulcers. He has been tolerating the dressing changes without complication. Fortunately there is no signs of active infection at this time. No fevers, chills, nausea, vomiting, or diarrhea. 12/08/2020 on evaluation today patient appears to be doing very well in regard to his bilateral lower extremity ulcers. They seem to be doing excellent and he is making great progress. There does not appear to be any evidence of active infection at this time. No fevers, chills, nausea, vomiting, or diarrhea. 12/15/2020 upon evaluation today patient actually appears to be doing excellent in regard to his wounds. Has been tolerating the dressing changes without complication. Fortunately his wounds are showing signs of great improvement were using Hydrofera Blue. 12/29/2020 on evaluation today patient appears to be doing well with regard to his right leg which is completely healed. His left leg is also measuring smaller than not healed this is doing  very well. Fortunately there is no signs of active infection at this time 01/05/2021 upon evaluation today patient actually appears to be making signs of improvement with regard to his left leg. I am very pleased with how things are progressing. There is no evidence of active infection at this time. No fevers, chills, nausea, vomiting, or diarrhea. Patient is extremely happy with the fact that this is doing so well 01/12/2021 upon evaluation today patient appears to be doing excellent in regard to his leg ulcer. Fortunately there is no signs of active infection at this time. No fevers, chills, nausea, vomiting, or diarrhea. He has been tolerating the dressing changes without complication. 01/19/2021 upon evaluation today patient appears to be doing well with regard to his wound. In fact this appears to be completely healed on the left leg. This is brand-new skin but overall seems to be doing quite well which is great news. No fevers, chills, nausea, vomiting, or diarrhea. READMISSION 04/17/2022 This is a patient has been seen on a number of occasions in the wound care center. He is 71 years old and has type 2 diabetes mellitus, coronary artery disease, chronic venous insufficiency, and peripheral arterial disease with recent vascular studies demonstrating a right TBI of 0.9 (normal) and a left great toe TBI of 0.33. 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 He recently dropped an object and it hit him in the left shin. He then bumped the same leg against his wagon. He now has 2 wounds on his left anterior lower leg. He says that he applied Aquaphor to keep the areas moist and made an appointment in the wound care center for follow-up. 04/24/2022: Today, he is here with what he describes as 10 out of 10 pain in his wounds. Both of them measured a little bit larger today, but this may be secondary to the debridement that was performed last week. He says that due to it being spring season, he  is on his feet quite a bit more. The leg is red and warm. We have been using silver alginate with Kerlix and Coban wraps. 05/01/2022: PCR culture taken last week grew out MRSA. I prescribed doxycycline. He has been taking this. Both  wounds are a bit smaller today and somewhat less painful. They continue to accumulate a bit of slough. 05/08/2022: He has completed his course of oral doxycycline. We have been using mupirocin under silver alginate with Kerlix and Coban wrapping. Today, both wounds are a bit smaller but have accumulated slough on the surface. They are less tender. 05/23/2022: Both wounds are quite a bit more shallow today. There is a little bit of slough accumulation, but there is also good granulation tissue as well as some buds of epithelialized tissue coming through the wound. No surrounding erythema, induration, nor any purulent drainage. We have been using mupirocin with silver alginate and Kerlix/Coban wrapping. 05/29/2022: The more anterior wound continues to demonstrate encroaching epithelium and the remaining open portion has good granulation tissue. The more lateral wound is nearly closed with just a pinpoint opening. No concern for infection. 06/05/2022: The lateral wound has healed. The more anterior wound is much smaller today with just a little bit of surrounding eschar. The wound surface is healthy-appearing with good granulation tissue. 06/13/2022: The wound on the anterior tibial surface on the left is nearly closed. There is just a tiny opening underneath some eschar. Unfortunately, his dog jumped up on him and cut his right lower leg. There is a flap of nonviable skin with some hematoma and bruising. No surrounding erythema or induration. No purulent discharge. Patient History Information obtained from Patient. Social History Current every day smoker - 1 PPD, Marital Status - Married, Alcohol Use - Rarely, Drug Use - No History, Caffeine Use - Daily - coffee, soda. Medical  History Eyes Patient has history of Cataracts - both eyes Cardiovascular Patient has history of Arrhythmia - A-fib, Coronary Artery Disease, Hypertension, Myocardial Infarction - CABG x 4, Peripheral Arterial Disease, Peripheral Venous Disease Endocrine Patient has history of Type II Diabetes Musculoskeletal Patient has history of Osteoarthritis Neurologic Patient has history of Neuropathy Oncologic Patient has history of Received Radiation Medical A Surgical History Notes nd Cardiovascular Hyperlipidemia, CVA, AAA Oncologic Prostate cancer Objective Constitutional No acute distress.. Vitals Time Taken: 7:47 AM, Height: 70 in, Weight: 219 lbs, BMI: 31.4, Temperature: 97.6 F, Pulse: 89 bpm, Respiratory Rate: 16 breaths/min, Blood Pressure: 114/68 mmHg. Respiratory Normal work of breathing on room air.. General Notes: 06/13/2022: The wound on the anterior tibial surface on the left is nearly closed. There is just a tiny opening underneath some eschar. Unfortunately, his dog jumped up on him and cut his right lower leg. There is a flap of nonviable skin with some hematoma and bruising. No surrounding erythema or induration. No purulent discharge. Integumentary (Hair, Skin) Wound #3 status is Open. Original cause of wound was Trauma. The date acquired was: 04/10/2022. The wound has been in treatment 8 weeks. The wound is located on the Left,Anterior Lower Leg. The wound measures 0.1cm length x 0.1cm width x 0.1cm depth; 0.008cm^2 area and 0.001cm^3 volume. There is Fat Layer (Subcutaneous Tissue) exposed. There is no tunneling or undermining noted. There is a medium amount of serosanguineous drainage noted. The wound margin is flat and intact. There is large (67-100%) red, pink granulation within the wound bed. There is no necrotic tissue within the wound bed. Wound #5 status is Open. Original cause of wound was Trauma. The date acquired was: 06/05/2022. The wound is located on the  Right Lower Leg. The wound measures 2.5cm length x 3.4cm width x 0.1cm depth; 6.676cm^2 area and 0.668cm^3 volume. There is Fat Layer (Subcutaneous Tissue) exposed. There is no tunneling  or undermining noted. There is a medium amount of serosanguineous drainage noted. The wound margin is distinct with the outline attached to the wound base. There is large (67-100%) red granulation within the wound bed. There is no necrotic tissue within the wound bed. Assessment Active Problems ICD-10 Non-pressure chronic ulcer of other part of left lower leg with fat layer exposed Type 2 diabetes mellitus with other skin ulcer Peripheral vascular disease, unspecified Venous insufficiency (chronic) (peripheral) Non-pressure chronic ulcer of other part of right lower leg with fat layer exposed Procedures Wound #3 Pre-procedure diagnosis of Wound #3 is a Diabetic Wound/Ulcer of the Lower Extremity located on the Left,Anterior Lower Leg .Severity of Tissue Pre Debridement is: Fat layer exposed. There was a Selective/Open Wound Non-Viable Tissue Debridement with a total area of 1 sq cm performed by Marcus Maudlin, MD. With the following instrument(s): Curette to remove Non-Viable tissue/material. Material removed includes Eschar after achieving pain control using Other (benzocaine 20% spray). No specimens were taken. A time out was conducted at 08:04, prior to the start of the procedure. A Minimum amount of bleeding was controlled with Pressure. The procedure was tolerated well with a pain level of 0 throughout and a pain level of 0 following the procedure. Post Debridement Measurements: 0.2cm length x 0.2cm width x 0.2cm depth; 0.006cm^3 volume. Character of Wound/Ulcer Post Debridement is improved. Severity of Tissue Post Debridement is: Fat layer exposed. Post procedure Diagnosis Wound #3: Same as Pre-Procedure Wound #5 Pre-procedure diagnosis of Wound #5 is a Trauma, Other located on the Right Lower Leg .  There was a Excisional Skin/Subcutaneous Tissue Debridement with a total area of 8.5 sq cm performed by Marcus Maudlin, MD. With the following instrument(s): Curette to remove Viable and Non-Viable tissue/material. Material removed includes Subcutaneous Tissue and Skin: Epidermis and after achieving pain control using Other (benzocaine 20% spray). No specimens were taken. A time out was conducted at 08:04, prior to the start of the procedure. A Minimum amount of bleeding was controlled with Pressure. The procedure was tolerated well with a pain level of 0 throughout and a pain level of 0 following the procedure. Post Debridement Measurements: 2.5cm length x 3.4cm width x 0.1cm depth; 0.668cm^3 volume. Character of Wound/Ulcer Post Debridement is improved. Post procedure Diagnosis Wound #5: Same as Pre-Procedure Plan Follow-up Appointments: Return Appointment in 1 week. - Dr Celine Ahr - Room 2 - 6/ Bathing/ Shower/ Hygiene: May shower with protection but do not get wound dressing(s) wet. - Please do not get Left Leg wet/damp. Edema Control - Lymphedema / SCD / Other: Avoid standing for long periods of time. Exercise regularly Moisturize legs daily. WOUND #3: - Lower Leg Wound Laterality: Left, Anterior Cleanser: Soap and Water 1 x Per Week/30 Days Discharge Instructions: May shower and wash wound with dial antibacterial soap and water prior to dressing change. Cleanser: Wound Cleanser 1 x Per Week/30 Days Discharge Instructions: Cleanse the wound with wound cleanser prior to applying a clean dressing using gauze sponges, not tissue or cotton balls. Peri-Wound Care: Triamcinolone 15 (g) 1 x Per Week/30 Days Discharge Instructions: Use triamcinolone 15 (g) as directed Peri-Wound Care: Sween Lotion (Moisturizing lotion) 1 x Per Week/30 Days Discharge Instructions: Apply moisturizing lotion as directed Topical: Mupirocin Ointment 1 x Per Week/30 Days Discharge Instructions: Apply Mupirocin  (Bactroban) as instructed Prim Dressing: KerraCel Ag Gelling Fiber Dressing, 2x2 in (silver alginate) 1 x Per Week/30 Days ary Discharge Instructions: Apply silver alginate to wound bed as instructed Secondary Dressing: Woven  Gauze Sponge, Non-Sterile 4x4 in 1 x Per Week/30 Days Discharge Instructions: Apply over primary dressing as directed. Secured With: 370M Medipore Public affairs consultant Surgical T 2x10 (in/yd) 1 x Per Week/30 Days ape Discharge Instructions: Secure with tape as directed. Com pression Wrap: Kerlix Roll 4.5x3.1 (in/yd) 1 x Per Week/30 Days Discharge Instructions: Apply Kerlix and Coban compression as directed. Com pression Wrap: Coban Self-Adherent Wrap 4x5 (in/yd) 1 x Per Week/30 Days Discharge Instructions: Apply over Kerlix as directed. WOUND #5: - Lower Leg Wound Laterality: Right Cleanser: Soap and Water 1 x Per Week/30 Days Discharge Instructions: May shower and wash wound with dial antibacterial soap and water prior to dressing change. Cleanser: Wound Cleanser 1 x Per Week/30 Days Discharge Instructions: Cleanse the wound with wound cleanser prior to applying a clean dressing using gauze sponges, not tissue or cotton balls. Peri-Wound Care: Triamcinolone 15 (g) 1 x Per Week/30 Days Discharge Instructions: Use triamcinolone 15 (g) as directed Peri-Wound Care: Sween Lotion (Moisturizing lotion) 1 x Per Week/30 Days Discharge Instructions: Apply moisturizing lotion as directed Topical: Mupirocin Ointment 1 x Per Week/30 Days Discharge Instructions: Apply Mupirocin (Bactroban) as instructed Prim Dressing: KerraCel Ag Gelling Fiber Dressing, 2x2 in (silver alginate) 1 x Per Week/30 Days ary Discharge Instructions: Apply silver alginate to wound bed as instructed Secondary Dressing: Woven Gauze Sponge, Non-Sterile 4x4 in 1 x Per Week/30 Days Discharge Instructions: Apply over primary dressing as directed. Secured With: 370M Medipore Public affairs consultant Surgical T 2x10 (in/yd) 1 x Per  Week/30 Days ape Discharge Instructions: Secure with tape as directed. Com pression Wrap: Kerlix Roll 4.5x3.1 (in/yd) 1 x Per Week/30 Days Discharge Instructions: Apply Kerlix and Coban compression as directed. Com pression Wrap: Coban Self-Adherent Wrap 4x5 (in/yd) 1 x Per Week/30 Days Discharge Instructions: Apply over Kerlix as directed. 06/13/2022: The wound on the anterior tibial surface on the left is nearly closed. There is just a tiny opening underneath some eschar. Unfortunately, his dog jumped up on him and cut his right lower leg. There is a flap of nonviable skin with some hematoma and bruising. No surrounding erythema or induration. No purulent discharge. I used a curette to remove the eschar from the left sided wound. I used a combination of curette, scissors, and forceps to debride slough, subcutaneous tissue, old hematoma, and the flap of nonviable skin from the new right sided wound. We will apply mupirocin and silver alginate to both wounds followed by Kerlix and Coban wrapping. Follow-up in 1 week. Electronic Signature(s) Signed: 06/13/2022 8:18:41 AM By: Marcus Maudlin MD FACS Entered By: [name] on 06/13/2022 08:18:40 -------------------------------------------------------------------------------- HxROS Details Patient Name: Date of Service: [name], Marcus [name] 06/13/2022 7:45 A M Medical Record Number: [id number] Patient Account Number: [id number] Date of Birth/Sex: Treating RN: 1951/06/05 (71 y.o. [name] Primary Care Provider: [name] Other Clinician: Referring Provider: Treating Provider/Extender: [name] Weeks in Treatment: 8 Information Obtained From Patient Eyes Medical History: Positive for: Cataracts - both eyes Cardiovascular Medical History: Positive for: Arrhythmia - A-fib; Coronary Artery Disease; Hypertension; Myocardial Infarction - CABG x 4; Peripheral Arterial Disease; Peripheral  Venous Disease Past Medical History Notes: Hyperlipidemia, CVA, AAA Endocrine Medical History: Positive for: Type II Diabetes Time with diabetes: over 5 years Treated with: Oral agents Blood sugar tested every day: No Musculoskeletal Medical History: Positive for: Osteoarthritis Neurologic Medical History: Positive for: Neuropathy Oncologic Medical History: Positive for: Received Radiation Past Medical History Notes: Prostate cancer HBO Extended History Items Eyes:  Cataracts Immunizations Pneumococcal Vaccine: Received Pneumococcal Vaccination: Yes Received Pneumococcal Vaccination On or After 60th Birthday: Yes Implantable Devices None Family and Social History Current every day smoker - 1 PPD; Marital Status - Married; Alcohol Use: Rarely; Drug Use: No History; Caffeine Use: Daily - coffee, soda; Financial Concerns: No; Food, Clothing or Shelter Needs: No; Support System Lacking: No; Transportation Concerns: No Electronic Signature(s) Signed: 06/13/2022 11:12:49 AM By: Marcus Maudlin MD FACS Signed: 06/13/2022 4:52:42 PM By: Sabas Sous By: [name] on 06/13/2022 08:17:27 -------------------------------------------------------------------------------- SuperBill Details Patient Name: Date of Service: [name], Marcus [name] 06/13/2022 Medical Record Number: [id number] Patient Account Number: [id number] Date of Birth/Sex: Treating RN: [birth year] (71 y.o. [name] Primary Care Provider: [name] Other Clinician: Referring Provider: Treating Provider/Extender: [name] Weeks in Treatment: 8 Diagnosis Coding ICD-10 Codes Code Description [phone] Non-pressure chronic ulcer of other part of left lower leg with fat layer exposed E11.622 Type 2 diabetes mellitus with other skin ulcer I73.9 Peripheral vascular disease, unspecified I87.2 Venous insufficiency (chronic) (peripheral) L97.812 Non-pressure  chronic ulcer of other part of right lower leg with fat layer exposed Facility Procedures CPT4 Code: [id number] Description: 27741 - DEB SUBQ TISSUE 20 SQ CM/< ICD-10 Diagnosis Description O87.867 Non-pressure chronic ulcer of other part of right lower leg with fat layer expos Modifier: ed Quantity: 1 CPT4 Code: [id number] Description: 96283 - DEBRIDE WOUND 1ST 20 SQ CM OR < ICD-10 Diagnosis Description L97.822 Non-pressure chronic ulcer of other part of left lower leg with fat layer expose Modifier: d Quantity: 1 Physician Procedures : CPT4 Code Description Modifier [id number] - WC PHYS LEVEL 3 - EST PT 25 ICD-10 Diagnosis Description L97.822 Non-pressure chronic ulcer of other part of left lower leg with fat layer exposed L97.812 Non-pressure chronic ulcer of other part of right  lower leg with fat layer exposed E11.622 Type 2 diabetes mellitus with other skin ulcer I73.9 Peripheral vascular disease, unspecified Quantity: 1 : [id number] 11042 - WC PHYS SUBQ TISS 20 SQ CM ICD-10 Diagnosis Description L27.517 Non-pressure chronic ulcer of other part of right lower leg with fat layer exposed Quantity: 1 : [id number] - WC PHYS DEBR WO ANESTH 20 SQ CM ICD-10 Diagnosis Description L97.822 Non-pressure chronic ulcer of other part of left lower leg with fat layer exposed Quantity: 1 Electronic Signature(s) Signed: 06/13/2022 8:20:01 AM By: Marcus Maudlin MD FACS Entered By: [name] on 06/13/2022 08:20:01

## 2022-06-15 ENCOUNTER — Encounter: Payer: PPO | Admitting: Thoracic Surgery (Cardiothoracic Vascular Surgery)

## 2022-06-16 ENCOUNTER — Encounter: Payer: Self-pay | Admitting: Physician Assistant

## 2022-06-16 ENCOUNTER — Ambulatory Visit (INDEPENDENT_AMBULATORY_CARE_PROVIDER_SITE_OTHER): Payer: PPO | Admitting: Physician Assistant

## 2022-06-16 VITALS — BP 125/68 | HR 71 | Temp 97.1°F | Ht 68.11 in | Wt 222.1 lb

## 2022-06-16 DIAGNOSIS — R300 Dysuria: Secondary | ICD-10-CM

## 2022-06-16 DIAGNOSIS — E669 Obesity, unspecified: Secondary | ICD-10-CM

## 2022-06-16 DIAGNOSIS — Z720 Tobacco use: Secondary | ICD-10-CM

## 2022-06-16 DIAGNOSIS — I152 Hypertension secondary to endocrine disorders: Secondary | ICD-10-CM

## 2022-06-16 DIAGNOSIS — E1159 Type 2 diabetes mellitus with other circulatory complications: Secondary | ICD-10-CM | POA: Diagnosis not present

## 2022-06-16 DIAGNOSIS — E782 Mixed hyperlipidemia: Secondary | ICD-10-CM | POA: Diagnosis not present

## 2022-06-16 DIAGNOSIS — E1169 Type 2 diabetes mellitus with other specified complication: Secondary | ICD-10-CM

## 2022-06-16 LAB — POCT URINALYSIS DIP (CLINITEK)
Bilirubin, UA: NEGATIVE
Glucose, UA: >=1000 mg/dL — AB
Ketones, POC UA: NEGATIVE mg/dL
Leukocytes, UA: NEGATIVE
Nitrite, UA: NEGATIVE
Spec Grav, UA: 1.015 (ref 1.010–1.025)
Urobilinogen, UA: 0.2 E.U./dL
pH, UA: 5.5 (ref 5.0–8.0)

## 2022-06-16 LAB — POCT GLYCOSYLATED HEMOGLOBIN (HGB A1C): Hemoglobin A1C: 8.7 % — AB (ref 4.0–5.6)

## 2022-06-16 MED ORDER — GLIPIZIDE 5 MG PO TABS: 5.0000 mg | ORAL_TABLET | Freq: Every day | ORAL | 1 refills | Status: DC

## 2022-06-16 MED ORDER — ATORVASTATIN CALCIUM 10 MG PO TABS: 10.0000 mg | ORAL_TABLET | Freq: Every day | ORAL | 1 refills | Status: DC

## 2022-06-16 MED ORDER — METFORMIN HCL ER 500 MG PO TB24: 500.0000 mg | ORAL_TABLET | Freq: Every day | ORAL | 1 refills | Status: DC

## 2022-06-18 LAB — URINE CULTURE

## 2022-06-19 ENCOUNTER — Encounter (HOSPITAL_BASED_OUTPATIENT_CLINIC_OR_DEPARTMENT_OTHER): Payer: PPO | Admitting: General Surgery

## 2022-06-19 ENCOUNTER — Institutional Professional Consult (permissible substitution): Payer: PPO | Admitting: Thoracic Surgery (Cardiothoracic Vascular Surgery)

## 2022-06-19 ENCOUNTER — Encounter: Payer: Self-pay | Admitting: Thoracic Surgery (Cardiothoracic Vascular Surgery)

## 2022-06-19 DIAGNOSIS — L97812 Non-pressure chronic ulcer of other part of right lower leg with fat layer exposed: Secondary | ICD-10-CM | POA: Diagnosis not present

## 2022-06-19 DIAGNOSIS — R911 Solitary pulmonary nodule: Secondary | ICD-10-CM | POA: Diagnosis not present

## 2022-06-19 DIAGNOSIS — E11622 Type 2 diabetes mellitus with other skin ulcer: Secondary | ICD-10-CM | POA: Diagnosis not present

## 2022-06-21 ENCOUNTER — Encounter: Payer: Self-pay | Admitting: Gastroenterology

## 2022-06-26 ENCOUNTER — Encounter (HOSPITAL_BASED_OUTPATIENT_CLINIC_OR_DEPARTMENT_OTHER): Payer: PPO | Attending: General Surgery | Admitting: General Surgery

## 2022-06-26 ENCOUNTER — Ambulatory Visit: Payer: PPO | Admitting: Physician Assistant

## 2022-06-26 DIAGNOSIS — E11622 Type 2 diabetes mellitus with other skin ulcer: Secondary | ICD-10-CM | POA: Diagnosis not present

## 2022-06-26 DIAGNOSIS — E1151 Type 2 diabetes mellitus with diabetic peripheral angiopathy without gangrene: Secondary | ICD-10-CM | POA: Diagnosis not present

## 2022-06-26 DIAGNOSIS — S8011XA Contusion of right lower leg, initial encounter: Secondary | ICD-10-CM | POA: Insufficient documentation

## 2022-06-26 DIAGNOSIS — L97812 Non-pressure chronic ulcer of other part of right lower leg with fat layer exposed: Secondary | ICD-10-CM | POA: Insufficient documentation

## 2022-06-26 DIAGNOSIS — I872 Venous insufficiency (chronic) (peripheral): Secondary | ICD-10-CM | POA: Diagnosis not present

## 2022-06-26 DIAGNOSIS — W2209XA Striking against other stationary object, initial encounter: Secondary | ICD-10-CM | POA: Insufficient documentation

## 2022-06-26 DIAGNOSIS — S81811A Laceration without foreign body, right lower leg, initial encounter: Secondary | ICD-10-CM | POA: Insufficient documentation

## 2022-06-26 DIAGNOSIS — E1140 Type 2 diabetes mellitus with diabetic neuropathy, unspecified: Secondary | ICD-10-CM | POA: Diagnosis not present

## 2022-06-26 DIAGNOSIS — H11001 Unspecified pterygium of right eye: Secondary | ICD-10-CM | POA: Diagnosis not present

## 2022-06-28 NOTE — Progress Notes (Signed)
MARIAH, HARN ([id number]) . Visit Report for 06/26/2022 Arrival Information Details Patient Name: Date of Service: [name] 06/26/2022 8:30 A M Medical Record Number: [id number] Patient Account Number: [id number] Date of Birth/Sex: Treating RN: Jul 27, 1951 (71 y.o. [name] Primary Care Ladene Allocca: [name] Other Clinician: Referring Rasul Decola: Treating Trystian Crisanto/Extender: Casandra Doffing in Treatment: 10 Visit Information History Since Last Visit Added or deleted any medications: No Patient Arrived: Ambulatory Any new allergies or adverse reactions: No Arrival Time: 08:29 Had a fall or experienced change in No Accompanied By: self activities of daily living that may affect Transfer Assistance: None risk of falls: Patient Identification Verified: Yes Signs or symptoms of abuse/neglect since last visito No Secondary Verification Process Completed: Yes Hospitalized since last visit: No Patient Requires Transmission-Based Precautions: No Implantable device outside of the clinic excluding No Patient Has Alerts: Yes cellular tissue based products placed in the center Patient Alerts: Patient on Blood Thinner since last visit: ABI R 1.11 Has Dressing in Place as Prescribed: Yes ABI L 0.72 Has Compression in Place as Prescribed: Yes TBI L 0.33 Pain Present Now: No Electronic Signature(s) Signed: 06/28/2022 5:10:18 PM By: Adline Peals Entered By: Adline Peals on 06/26/2022 08:31:32 -------------------------------------------------------------------------------- Encounter Discharge Information Details Patient Name: Date of Service: [name], [name] 06/26/2022 8:30 A M Medical Record Number: [id number] Patient Account Number: [id number] Date of Birth/Sex: Treating RN: Aug 31, 1951 (71 y.o. [name] Primary Care Egbert Seidel: [name] Other Clinician: Referring Katrine Radich: Treating Chantelle Verdi/Extender: Casandra Doffing in Treatment: 10 Encounter Discharge Information Items Post Procedure Vitals Discharge Condition: Stable Temperature (F): 97.9 Ambulatory Status: Ambulatory Pulse (bpm): 76 Discharge Destination: Home Respiratory Rate (breaths/min): 16 Transportation: Private Auto Blood Pressure (mmHg): 119/69 Accompanied By: self Schedule Follow-up Appointment: Yes Clinical Summary of Care: Patient Declined Electronic Signature(s) Signed: 06/28/2022 5:10:18 PM By: Adline Peals Entered By: Adline Peals on 06/26/2022 09:49:45 -------------------------------------------------------------------------------- Lower Extremity Assessment Details Patient Name: Date of Service: [name] 06/26/2022 8:30 A M Medical Record Number: [id number] Patient Account Number: [id number] Date of Birth/Sex: Treating RN: [birth year] (71 y.o. [name] Primary Care Aaliyah Cancro: [name] Other Clinician: Referring Zita Ozimek: Treating Ruqayyah Lute/Extender: [name] Weeks in Treatment: 10 Edema Assessment Assessed: Shirlyn Goltz: No] [Right: No] E[Left: dema] [Right: :] Calf Left: Right: Point of Measurement: 34 cm From Medial Instep 36.1 cm 39.1 cm Ankle Left: Right: Point of Measurement: 12 cm From Medial Instep 25.5 cm 24.9 cm Vascular Assessment Pulses: Dorsalis Pedis Palpable: [Left:Yes] [Right:Yes] Electronic Signature(s) Signed: 06/28/2022 5:10:18 PM By: Adline Peals Entered By: Adline Peals on 06/26/2022 08:38:41 -------------------------------------------------------------------------------- Multi Wound Chart Details Patient Name: Date of Service: [name], [name] 06/26/2022 8:30 A M Medical Record Number: [id number] Patient Account Number: [id number] Date of Birth/Sex: Treating RN: May 12, 1951 (71 y.o. [name] Primary Care Gloriann Riede: [name] Other Clinician: Referring Murle Otting: Treating  Wynnie Pacetti/Extender: [name] Weeks in Treatment: 10 Vital Signs Height(in): 70 Pulse(bpm): 20 Weight(lbs): 219 Blood Pressure(mmHg): 119/69 Body Mass Index(BMI): 31.4 Temperature(F): 97.9 Respiratory Rate(breaths/min): 16 Photos: [N/A:N/A] Right Lower Leg N/A N/A Wound Location: Trauma N/A N/A Wounding Event: Trauma, Other N/A N/A Primary Etiology: Cataracts, Arrhythmia, Coronary N/A N/A Comorbid History: Artery Disease, Hypertension, Myocardial Infarction, Peripheral Arterial Disease, Peripheral Venous Disease, Type II Diabetes, Osteoarthritis, Neuropathy, Received Radiation 06/05/2022 N/A N/A Date Acquired: 1 N/A N/A Weeks of Treatment: Open N/A N/A Wound Status: No N/A  N/A Wound Recurrence: 2.2x2.9x0.1 N/A N/A Measurements L x W x D (cm) 5.011 N/A N/A A (cm) : rea 0.501 N/A N/A Volume (cm) : 24.90% N/A N/A % Reduction in A rea: 25.00% N/A N/A % Reduction in Volume: Full Thickness Without Exposed N/A N/A Classification: Support Structures Medium N/A N/A Exudate A mount: Serosanguineous N/A N/A Exudate Type: red, brown N/A N/A Exudate Color: Distinct, outline attached N/A N/A Wound Margin: Large (67-100%) N/A N/A Granulation A mount: Red N/A N/A Granulation Quality: Small (1-33%) N/A N/A Necrotic A mount: Fat Layer (Subcutaneous Tissue): Yes N/A N/A Exposed Structures: Fascia: No Tendon: No Muscle: No Joint: No Bone: No Small (1-33%) N/A N/A Epithelialization: Debridement - Excisional N/A N/A Debridement: Pre-procedure Verification/Time Out 08:48 N/A N/A Taken: Lidocaine 4% Topical Solution N/A N/A Pain Control: Subcutaneous, Slough N/A N/A Tissue Debrided: Skin/Subcutaneous Tissue N/A N/A Level: 6.38 N/A N/A Debridement A (sq cm): rea Curette N/A N/A Instrument: Minimum N/A N/A Bleeding: Pressure N/A N/A Hemostasis A chieved: 4 N/A N/A Procedural Pain: 0 N/A N/A Post Procedural Pain: Procedure  was tolerated well N/A N/A Debridement Treatment Response: 2.2x2.9x0.1 N/A N/A Post Debridement Measurements L x W x D (cm) 0.501 N/A N/A Post Debridement Volume: (cm) Debridement N/A N/A Procedures Performed: Treatment Notes Electronic Signature(s) Signed: 06/26/2022 8:52:19 AM By: Fredirick Maudlin MD FACS Signed: 06/28/2022 5:10:18 PM By: Adline Peals Entered By: Fredirick Maudlin on 06/26/2022 08:52:18 -------------------------------------------------------------------------------- Multi-Disciplinary Care Plan Details Patient Name: Date of Service: [name], [name] 06/26/2022 8:30 A M Medical Record Number: [id number] Patient Account Number: [id number] Date of Birth/Sex: Treating RN: 1951/07/28 (71 y.o. [name] Primary Care Charissa Knowles: [name] Other Clinician: Referring Neeya Prigmore: Treating Ryliegh Mcduffey/Extender: [name] Weeks in Treatment: 10 Active Inactive Wound/Skin Impairment Nursing Diagnoses: Impaired tissue integrity Goals: Patient/caregiver will verbalize understanding of skin care regimen Date Initiated: 04/17/2022 Target Resolution Date: 07/21/2022 Goal Status: Active Interventions: Assess ulceration(s) every visit Treatment Activities: Skin care regimen initiated : 04/17/2022 Notes: Electronic Signature(s) Signed: 06/28/2022 5:10:18 PM By: Adline Peals Entered By: Adline Peals on 06/26/2022 08:40:04 -------------------------------------------------------------------------------- Pain Assessment Details Patient Name: Date of Service: [name] 06/26/2022 8:30 A M Medical Record Number: [id number] Patient Account Number: [id number] Date of Birth/Sex: Treating RN: October 29, 1951 (71 y.o. [name] Primary Care Nadalyn Deringer: [name] Other Clinician: Referring Kainon Varady: Treating Miette Molenda/Extender: [name] Weeks in Treatment: 10 Active Problems Location of  Pain Severity and Description of Pain Patient Has Paino No Site Locations Rate the pain. Current Pain Level: 0 Pain Management and Medication Current Pain Management: Electronic Signature(s) Signed: 06/28/2022 5:10:18 PM By: Adline Peals Entered By: Adline Peals on 06/26/2022 08:32:00 -------------------------------------------------------------------------------- Patient/Caregiver Education Details Patient Name: Date of Service: [name] 7/3/2023andnbsp8:30 A M Medical Record Number: [id number] Patient Account Number: [id number] Date of Birth/Gender: Treating RN: 09-08-1951 (71 y.o. [name] Primary Care Physician: [name] Other Clinician: Referring Physician: Treating Physician/Extender: Casandra Doffing in Treatment: 10 Education Assessment Education Provided To: Patient Education Topics Provided Wound/Skin Impairment: Methods: Explain/Verbal Responses: Reinforcements needed, State content correctly Electronic Signature(s) Signed: 06/28/2022 5:10:18 PM By: Adline Peals Entered By: Adline Peals on 06/26/2022 08:40:14 -------------------------------------------------------------------------------- Wound Assessment Details Patient Name: Date of Service: [name] 06/26/2022 8:30 A M Medical Record Number: [id number] Patient Account Number: [id number] Date of Birth/Sex: Treating RN: 1951/12/05 (71 y.o. [name] Primary Care Hopelynn Gartland: [name] Other Clinician: Referring  Amerigo Mcglory: Treating Bryana Froemming/Extender: [name] Weeks in Treatment: 10 Wound Status Wound Number: 5 Primary Trauma, Other Etiology: Wound Location: Right Lower Leg Wound Open Wounding Event: Trauma Status: Date Acquired: 06/05/2022 Comorbid Cataracts, Arrhythmia, Coronary Artery Disease, Hypertension, Weeks Of Treatment: 1 History: Myocardial Infarction, Peripheral Arterial  Disease, Peripheral Venous Clustered Wound: No Disease, Type II Diabetes, Osteoarthritis, Neuropathy, Received Radiation Photos Wound Measurements Length: (cm) 2.2 Width: (cm) 2.9 Depth: (cm) 0.1 Area: (cm) 5.011 Volume: (cm) 0.501 % Reduction in Area: 24.9% % Reduction in Volume: 25% Epithelialization: Small (1-33%) Tunneling: No Undermining: No Wound Description Classification: Full Thickness Without Exposed Support Structures Wound Margin: Distinct, outline attached Exudate Amount: Medium Exudate Type: Serosanguineous Exudate Color: red, brown Foul Odor After Cleansing: No Slough/Fibrino Yes Wound Bed Granulation Amount: Large (67-100%) Exposed Structure Granulation Quality: Red Fascia Exposed: No Necrotic Amount: Small (1-33%) Fat Layer (Subcutaneous Tissue) Exposed: Yes Necrotic Quality: Adherent Slough Tendon Exposed: No Muscle Exposed: No Joint Exposed: No Bone Exposed: No Treatment Notes Wound #5 (Lower Leg) Wound Laterality: Right Cleanser Soap and Water Discharge Instruction: May shower and wash wound with dial antibacterial soap and water prior to dressing change. Wound Cleanser Discharge Instruction: Cleanse the wound with wound cleanser prior to applying a clean dressing using gauze sponges, not tissue or cotton balls. Peri-Wound Care Triamcinolone 15 (g) Discharge Instruction: Use triamcinolone 15 (g) as directed Sween Lotion (Moisturizing lotion) Discharge Instruction: Apply moisturizing lotion as directed Topical Mupirocin Ointment Discharge Instruction: Apply Mupirocin (Bactroban) as instructed Primary Dressing KerraCel Ag Gelling Fiber Dressing, 2x2 in (silver alginate) Discharge Instruction: Apply silver alginate to wound bed as instructed Secondary Dressing Woven Gauze Sponge, Non-Sterile 4x4 in Discharge Instruction: Apply over primary dressing as directed. Secured With [employer] Surgical T 2x10 (in/yd) ape Discharge  Instruction: Secure with tape as directed. Compression Wrap Kerlix Roll 4.5x3.1 (in/yd) Discharge Instruction: Apply Kerlix and Coban compression as directed. Coban Self-Adherent Wrap 4x5 (in/yd) Discharge Instruction: Apply over Kerlix as directed. Compression Stockings Add-Ons Electronic Signature(s) Signed: 06/28/2022 5:10:18 PM By: Adline Peals Entered By: Adline Peals on 06/26/2022 08:42:00 -------------------------------------------------------------------------------- Vitals Details Patient Name: Date of Service: [name], [name] 06/26/2022 8:30 A M Medical Record Number: [id number] Patient Account Number: [id number] Date of Birth/Sex: Treating RN: 1951/02/19 (72 y.o. [name] Primary Care Sareen Randon: [name] Other Clinician: Referring Tahj Njoku: Treating Deborra Phegley/Extender: [name] Weeks in Treatment: 10 Vital Signs Time Taken: 08:30 Temperature (F): 97.9 Height (in): 70 Pulse (bpm): 76 Weight (lbs): 219 Respiratory Rate (breaths/min): 16 Body Mass Index (BMI): 31.4 Blood Pressure (mmHg): 119/69 Reference Range: 80 - 120 mg / dl Electronic Signature(s) Signed: 06/28/2022 5:10:18 PM By: Adline Peals Entered By: Adline Peals on 06/26/2022 08:31:53

## 2022-06-28 NOTE — Progress Notes (Signed)
[name], [name] ([id number]) . Visit Report for 06/26/2022 Chief Complaint Document Details Patient Name: Date of Service: [name]. 06/26/2022 8:30 A M Medical Record Number: [id number] Patient Account Number: [id number] Date of Birth/Sex: Treating RN: 1951-04-10 (71 y.o. [name] Primary Care Provider: [name] Other Clinician: Referring Provider: Treating Provider/Extender: Casandra Doffing in Treatment: 10 Information Obtained from: Patient Chief Complaint Patient seen for complaints of Non-Healing Wounds. Electronic Signature(s) Signed: 06/26/2022 8:52:34 AM By: Fredirick Maudlin MD FACS Entered By: Fredirick Maudlin on 06/26/2022 08:52:34 -------------------------------------------------------------------------------- Debridement Details Patient Name: Date of Service: MO [name], Elder Marcus T. 06/26/2022 8:30 A M Medical Record Number: [id number] Patient Account Number: [id number] Date of Birth/Sex: Treating RN: October 31, 1951 (71 y.o. [name] Primary Care Provider: [name] Other Clinician: Referring Provider: Treating Provider/Extender: [name] Weeks in Treatment: 10 Debridement Performed for Assessment: Wound #5 Right Lower Leg Performed By: Physician Fredirick Maudlin, MD Debridement Type: Debridement Level of Consciousness (Pre-procedure): Awake and Alert Pre-procedure Verification/Time Out Yes - 08:48 Taken: Start Time: 08:48 Pain Control: Lidocaine 4% T opical Solution T Area Debrided (L x W): otal 2.2 (cm) x 2.9 (cm) = 6.38 (cm) Tissue and other material debrided: Viable, Non-Viable, Slough, Subcutaneous, Slough Level: Skin/Subcutaneous Tissue Debridement Description: Excisional Instrument: Curette Specimen: Tissue Culture Number of Specimens T aken: 1 Bleeding: Minimum Hemostasis Achieved: Pressure Procedural Pain: 4 Post Procedural Pain: 0 Response to Treatment: Procedure  was tolerated well Level of Consciousness (Post- Awake and Alert procedure): Post Debridement Measurements of Total Wound Length: (cm) 2.2 Width: (cm) 2.9 Depth: (cm) 0.1 Volume: (cm) 0.501 Character of Wound/Ulcer Post Debridement: Improved Post Procedure Diagnosis Same as Pre-procedure Electronic Signature(s) Signed: 06/26/2022 11:38:54 AM By: Fredirick Maudlin MD FACS Signed: 06/28/2022 5:10:18 PM By: Adline Peals Entered By: Adline Peals on 06/26/2022 08:50:42 -------------------------------------------------------------------------------- HPI Details Patient Name: Date of Service: MO [name], Elder Marcus T. 06/26/2022 8:30 A M Medical Record Number: [id number] Patient Account Number: [id number] Date of Birth/Sex: Treating RN: 1951-04-07 (71 y.o. [name] Primary Care Provider: [name] Other Clinician: Referring Provider: Treating Provider/Extender: [name] Weeks in Treatment: 10 History of Present Illness HPI Description: 11/10/2020 patient presents today for initial evaluation in this clinic although I have seen this patient in [place] previous. Subsequently his issue today is different from when I saw him for I was treating him for a toe ulcer. Currently he is having issues with bilateral lower extremity ulcers after running into a metal bar. Upon inspection today he has wounds of the bilateral lower extremities which are consistent with having struck his legs on the anterior aspect. With that being said the patient did go to the ER for evaluation on October 11 where he was given Keflex initially. He went back to the ER after not improving on October 20 he was given Lasix at that point he states the Lasix seem to help the most. Has been using Neosporin and leaving this open area which is probably not the best way to go either to be honest. He has had arterial studies on March 2021 which showed a left ABI of 0.78 with a TBI of 0.34  and a right ABI of 1.18 with a TBI 1.18. Nonetheless his arterial flow the left is not ideal but also I think potentially consistent with allowing this to heal. I think he can also support light compression with Kerlix and Coban based on this result. His  most recent hemoglobin A1c that we could find was 7.3 and that was towards the end of 2020. The patient does have a history of heart disease unfortunately. He is also currently still a smoker. 11/24/2020 on evaluation today patient appears to be doing well with regard to his wounds. In fact both appear to be doing better after last week's rather last visit's debridement. Fortunately there is no signs of active infection at this time. No fevers, chills, nausea, vomiting, or diarrhea. Overall I am very pleased with where things stand today. 12/01/2020 on evaluation today patient appears to be doing well with regard to his leg ulcers. He has been tolerating the dressing changes without complication. Fortunately there is no signs of active infection at this time. No fevers, chills, nausea, vomiting, or diarrhea. 12/08/2020 on evaluation today patient appears to be doing very well in regard to his bilateral lower extremity ulcers. They seem to be doing excellent and he is making great progress. There does not appear to be any evidence of active infection at this time. No fevers, chills, nausea, vomiting, or diarrhea. 12/15/2020 upon evaluation today patient actually appears to be doing excellent in regard to his wounds. Has been tolerating the dressing changes without complication. Fortunately his wounds are showing signs of great improvement were using Hydrofera Blue. 12/29/2020 on evaluation today patient appears to be doing well with regard to his right leg which is completely healed. His left leg is also measuring smaller than not healed this is doing very well. Fortunately there is no signs of active infection at this time 01/05/2021 upon evaluation today  patient actually appears to be making signs of improvement with regard to his left leg. I am very pleased with how things are progressing. There is no evidence of active infection at this time. No fevers, chills, nausea, vomiting, or diarrhea. Patient is extremely happy with the fact that this is doing so well 01/12/2021 upon evaluation today patient appears to be doing excellent in regard to his leg ulcer. Fortunately there is no signs of active infection at this time. No fevers, chills, nausea, vomiting, or diarrhea. He has been tolerating the dressing changes without complication. 01/19/2021 upon evaluation today patient appears to be doing well with regard to his wound. In fact this appears to be completely healed on the left leg. This is brand-new skin but overall seems to be doing quite well which is great news. No fevers, chills, nausea, vomiting, or diarrhea. READMISSION 04/17/2022 This is a patient has been seen on a number of occasions in the wound care center. He is 71 years old and has type 2 diabetes mellitus, coronary artery disease, chronic venous insufficiency, and peripheral arterial disease with recent vascular studies demonstrating a right TBI of 0.9 (normal) and a left great toe TBI of 0.33. 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 He recently dropped an object and it hit him in the left shin. He then bumped the same leg against his wagon. He now has 2 wounds on his left anterior lower leg. He says that he applied Aquaphor to keep the areas moist and made an appointment in the wound care center for follow-up. 04/24/2022: Today, he is here with what he describes as 10 out of 10 pain in his wounds. Both of them measured a little bit larger today, but this may be secondary to the debridement that was performed last week. He says that due to it being spring season, he is on his feet  quite a bit more. The leg is red and warm. We have been using silver alginate with Kerlix  and Coban wraps. 05/01/2022: PCR culture taken last week grew out MRSA. I prescribed doxycycline. He has been taking this. Both wounds are a bit smaller today and somewhat less painful. They continue to accumulate a bit of slough. 05/08/2022: He has completed his course of oral doxycycline. We have been using mupirocin under silver alginate with Kerlix and Coban wrapping. Today, both wounds are a bit smaller but have accumulated slough on the surface. They are less tender. 05/23/2022: Both wounds are quite a bit more shallow today. There is a little bit of slough accumulation, but there is also good granulation tissue as well as some buds of epithelialized tissue coming through the wound. No surrounding erythema, induration, nor any purulent drainage. We have been using mupirocin with silver alginate and Kerlix/Coban wrapping. 05/29/2022: The more anterior wound continues to demonstrate encroaching epithelium and the remaining open portion has good granulation tissue. The more lateral wound is nearly closed with just a pinpoint opening. No concern for infection. 06/05/2022: The lateral wound has healed. The more anterior wound is much smaller today with just a little bit of surrounding eschar. The wound surface is healthy-appearing with good granulation tissue. 06/13/2022: The wound on the anterior tibial surface on the left is nearly closed. There is just a tiny opening underneath some eschar. Unfortunately, his dog jumped up on him and cut his right lower leg. There is a flap of nonviable skin with some hematoma and bruising. No surrounding erythema or induration. No purulent discharge. 06/19/2022: The wound on his left leg is closed. The right sided wound is smaller, but there is significant periwound erythema and the leg is more tender. There is slough accumulation on the wound surface. 06/26/2022: The right sided wound is about the same. It is tender but there is less periwound erythema. He still has  slough on the wound surface. He has been taking Bactrim as prescribed. Electronic Signature(s) Signed: 06/26/2022 8:53:14 AM By: Fredirick Maudlin MD FACS Entered By: Fredirick Maudlin on 06/26/2022 08:53:14 -------------------------------------------------------------------------------- Physical Exam Details Patient Name: Date of Service: [name], Elder Marcus T. 06/26/2022 8:30 A M Medical Record Number: [id number] Patient Account Number: [id number] Date of Birth/Sex: Treating RN: Sep 06, 1951 (71 y.o. [name] Primary Care Provider: [name] Other Clinician: Referring Provider: Treating Provider/Extender: [name] Weeks in Treatment: 10 Constitutional . . . . No acute distress.Marland Kitchen Respiratory Normal work of breathing on room air.. Notes 06/26/2022: The right sided wound is about the same. It is tender but there is less periwound erythema. He still has slough on the wound surface. Electronic Signature(s) Signed: 06/26/2022 8:53:53 AM By: Fredirick Maudlin MD FACS Entered By: Fredirick Maudlin on 06/26/2022 08:53:53 -------------------------------------------------------------------------------- Physician Orders Details Patient Name: Date of Service: MO [name], Elder Marcus T. 06/26/2022 8:30 A M Medical Record Number: [id number] Patient Account Number: [id number] Date of Birth/Sex: Treating RN: [birth year] (71 y.o. [name] Primary Care Provider: [name] Other Clinician: Referring Provider: Treating Provider/Extender: Casandra Doffing in Treatment: 10 Verbal / Phone Orders: No Diagnosis Coding ICD-10 Coding Code Description E11.622 Type 2 diabetes mellitus with other skin ulcer I73.9 Peripheral vascular disease, unspecified I87.2 Venous insufficiency (chronic) (peripheral) L97.812 Non-pressure chronic ulcer of other part of right lower leg with fat layer exposed Follow-up Appointments ppointment in 1 week. - Dr  Celine Ahr - Room 2 - Return A Bathing/  Shower/ Hygiene May shower with protection but do not get wound dressing(s) wet. - Please do not get Left Leg wet/damp. Edema Control - Lymphedema / SCD / Other Bilateral Lower Extremities Avoid standing for long periods of time. Exercise regularly Moisturize legs daily. Wound Treatment Wound #5 - Lower Leg Wound Laterality: Right Cleanser: Soap and Water 1 x Per Week/30 Days Discharge Instructions: May shower and wash wound with dial antibacterial soap and water prior to dressing change. Cleanser: Wound Cleanser 1 x Per Week/30 Days Discharge Instructions: Cleanse the wound with wound cleanser prior to applying a clean dressing using gauze sponges, not tissue or cotton balls. Peri-Wound Care: Triamcinolone 15 (g) 1 x Per Week/30 Days Discharge Instructions: Use triamcinolone 15 (g) as directed Peri-Wound Care: Sween Lotion (Moisturizing lotion) 1 x Per Week/30 Days Discharge Instructions: Apply moisturizing lotion as directed Topical: Mupirocin Ointment 1 x Per Week/30 Days Discharge Instructions: Apply Mupirocin (Bactroban) as instructed Prim Dressing: KerraCel Ag Gelling Fiber Dressing, 2x2 in (silver alginate) 1 x Per Week/30 Days ary Discharge Instructions: Apply silver alginate to wound bed as instructed Secondary Dressing: Woven Gauze Sponge, Non-Sterile 4x4 in 1 x Per Week/30 Days Discharge Instructions: Apply over primary dressing as directed. Secured With: 38M Medipore Public affairs consultant Surgical T 2x10 (in/yd) 1 x Per Week/30 Days ape Discharge Instructions: Secure with tape as directed. Compression Wrap: Kerlix Roll 4.5x3.1 (in/yd) 1 x Per Week/30 Days Discharge Instructions: Apply Kerlix and Coban compression as directed. Compression Wrap: Coban Self-Adherent Wrap 4x5 (in/yd) 1 x Per Week/30 Days Discharge Instructions: Apply over Kerlix as directed. Laboratory naerobe culture (MICRO) - PCR of non-healing wound to R anterior lower leg - (ICD10  L97.812 - Bacteria identified in Unspecified specimen by A Non-pressure chronic ulcer of other part of right lower leg with fat layer exposed) LOINC Code: 833-8 Convenience Name: Anaerobic culture Electronic Signature(s) Signed: 06/26/2022 12:41:00 PM By: Fredirick Maudlin MD FACS Signed: 06/28/2022 5:10:18 PM By: Adline Peals Previous Signature: 06/26/2022 11:38:54 AM Version By: Fredirick Maudlin MD FACS Entered By: Adline Peals on 06/26/2022 11:43:58 -------------------------------------------------------------------------------- Problem List Details Patient Name: Date of Service: [name], Elder Marcus T. 06/26/2022 8:30 A M Medical Record Number: [id number] Patient Account Number: [id number] Date of Birth/Sex: Treating RN: [birth year] (71 y.o. [name] Primary Care Provider: [name] Other Clinician: Referring Provider: Treating Provider/Extender: [name] Weeks in Treatment: 10 Active Problems ICD-10 Encounter Code Description Active Date MDM Diagnosis E11.622 Type 2 diabetes mellitus with other skin ulcer 04/17/2022 No Yes I73.9 Peripheral vascular disease, unspecified 04/17/2022 No Yes I87.2 Venous insufficiency (chronic) (peripheral) 04/17/2022 No Yes L97.812 Non-pressure chronic ulcer of other part of right lower leg with fat layer 06/13/2022 No Yes exposed Inactive Problems Resolved Problems ICD-10 Code Description Active Date Resolved Date L97.822 Non-pressure chronic ulcer of other part of left lower leg with fat layer exposed 04/17/2022 04/17/2022 Electronic Signature(s) Signed: 06/26/2022 8:52:13 AM By: Fredirick Maudlin MD FACS Entered By: Fredirick Maudlin on 06/26/2022 08:52:13 -------------------------------------------------------------------------------- Progress Note Details Patient Name: Date of Service: MO [name], Elder Marcus T. 06/26/2022 8:30 A M Medical Record Number: [id number] Patient Account Number: [id number] Date of  Birth/Sex: Treating RN: 11-28-1951 (71 y.o. [name] Primary Care Provider: [name] Other Clinician: Referring Provider: Treating Provider/Extender: Casandra Doffing in Treatment: 10 Subjective Chief Complaint Information obtained from Patient Patient seen for complaints of Non-Healing Wounds. History of Present Illness (HPI) 11/10/2020 patient presents today for initial evaluation in this clinic  although I have seen this patient in [place] previous. Subsequently his issue today is different from when I saw him for I was treating him for a toe ulcer. Currently he is having issues with bilateral lower extremity ulcers after running into a metal bar. Upon inspection today he has wounds of the bilateral lower extremities which are consistent with having struck his legs on the anterior aspect. With that being said the patient did go to the ER for evaluation on October 11 where he was given Keflex initially. He went back to the ER after not improving on October 20 he was given Lasix at that point he states the Lasix seem to help the most. Has been using Neosporin and leaving this open area which is probably not the best way to go either to be honest. He has had arterial studies on March 2021 which showed a left ABI of 0.78 with a TBI of 0.34 and a right ABI of 1.18 with a TBI 1.18. Nonetheless his arterial flow the left is not ideal but also I think potentially consistent with allowing this to heal. I think he can also support light compression with Kerlix and Coban based on this result. His most recent hemoglobin A1c that we could find was 7.3 and that was towards the end of 2020. The patient does have a history of heart disease unfortunately. He is also currently still a smoker. 11/24/2020 on evaluation today patient appears to be doing well with regard to his wounds. In fact both appear to be doing better after last week's rather last visit's  debridement. Fortunately there is no signs of active infection at this time. No fevers, chills, nausea, vomiting, or diarrhea. Overall I am very pleased with where things stand today. 12/01/2020 on evaluation today patient appears to be doing well with regard to his leg ulcers. He has been tolerating the dressing changes without complication. Fortunately there is no signs of active infection at this time. No fevers, chills, nausea, vomiting, or diarrhea. 12/08/2020 on evaluation today patient appears to be doing very well in regard to his bilateral lower extremity ulcers. They seem to be doing excellent and he is making great progress. There does not appear to be any evidence of active infection at this time. No fevers, chills, nausea, vomiting, or diarrhea. 12/15/2020 upon evaluation today patient actually appears to be doing excellent in regard to his wounds. Has been tolerating the dressing changes without complication. Fortunately his wounds are showing signs of great improvement were using Hydrofera Blue. 12/29/2020 on evaluation today patient appears to be doing well with regard to his right leg which is completely healed. His left leg is also measuring smaller than not healed this is doing very well. Fortunately there is no signs of active infection at this time 01/05/2021 upon evaluation today patient actually appears to be making signs of improvement with regard to his left leg. I am very pleased with how things are progressing. There is no evidence of active infection at this time. No fevers, chills, nausea, vomiting, or diarrhea. Patient is extremely happy with the fact that this is doing so well 01/12/2021 upon evaluation today patient appears to be doing excellent in regard to his leg ulcer. Fortunately there is no signs of active infection at this time. No fevers, chills, nausea, vomiting, or diarrhea. He has been tolerating the dressing changes without complication. 01/19/2021 upon evaluation  today patient appears to be doing well with regard to his wound. In fact this appears  to be completely healed on the left leg. This is brand-new skin but overall seems to be doing quite well which is great news. No fevers, chills, nausea, vomiting, or diarrhea. READMISSION 04/17/2022 This is a patient has been seen on a number of occasions in the wound care center. He is 71 years old and has type 2 diabetes mellitus, coronary artery disease, chronic venous insufficiency, and peripheral arterial disease with recent vascular studies demonstrating a right TBI of 0.9 (normal) and a left great toe TBI of 0.33. 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 He recently dropped an object and it hit him in the left shin. He then bumped the same leg against his wagon. He now has 2 wounds on his left anterior lower leg. He says that he applied Aquaphor to keep the areas moist and made an appointment in the wound care center for follow-up. 04/24/2022: Today, he is here with what he describes as 10 out of 10 pain in his wounds. Both of them measured a little bit larger today, but this may be secondary to the debridement that was performed last week. He says that due to it being spring season, he is on his feet quite a bit more. The leg is red and warm. We have been using silver alginate with Kerlix and Coban wraps. 05/01/2022: PCR culture taken last week grew out MRSA. I prescribed doxycycline. He has been taking this. Both wounds are a bit smaller today and somewhat less painful. They continue to accumulate a bit of slough. 05/08/2022: He has completed his course of oral doxycycline. We have been using mupirocin under silver alginate with Kerlix and Coban wrapping. Today, both wounds are a bit smaller but have accumulated slough on the surface. They are less tender. 05/23/2022: Both wounds are quite a bit more shallow today. There is a little bit of slough accumulation, but there is also good granulation  tissue as well as some buds of epithelialized tissue coming through the wound. No surrounding erythema, induration, nor any purulent drainage. We have been using mupirocin with silver alginate and Kerlix/Coban wrapping. 05/29/2022: The more anterior wound continues to demonstrate encroaching epithelium and the remaining open portion has good granulation tissue. The more lateral wound is nearly closed with just a pinpoint opening. No concern for infection. 06/05/2022: The lateral wound has healed. The more anterior wound is much smaller today with just a little bit of surrounding eschar. The wound surface is healthy-appearing with good granulation tissue. 06/13/2022: The wound on the anterior tibial surface on the left is nearly closed. There is just a tiny opening underneath some eschar. Unfortunately, his dog jumped up on him and cut his right lower leg. There is a flap of nonviable skin with some hematoma and bruising. No surrounding erythema or induration. No purulent discharge. 06/19/2022: The wound on his left leg is closed. The right sided wound is smaller, but there is significant periwound erythema and the leg is more tender. There is slough accumulation on the wound surface. 06/26/2022: The right sided wound is about the same. It is tender but there is less periwound erythema. He still has slough on the wound surface. He has been taking Bactrim as prescribed. Patient History Information obtained from Patient. Social History Current every day smoker - 1 PPD, Marital Status - Married, Alcohol Use - Rarely, Drug Use - No History, Caffeine Use - Daily - coffee, soda. Medical History Eyes Patient has history of Cataracts - both eyes Cardiovascular Patient  has history of Arrhythmia - A-fib, Coronary Artery Disease, Hypertension, Myocardial Infarction - CABG x 4, Peripheral Arterial Disease, Peripheral Venous Disease Endocrine Patient has history of Type II Diabetes Musculoskeletal Patient has  history of Osteoarthritis Neurologic Patient has history of Neuropathy Oncologic Patient has history of Received Radiation Medical A Surgical History Notes nd Cardiovascular Hyperlipidemia, CVA, AAA Oncologic Prostate cancer Objective Constitutional No acute distress.. Vitals Time Taken: 8:30 AM, Height: 70 in, Weight: 219 lbs, BMI: 31.4, Temperature: 97.9 F, Pulse: 76 bpm, Respiratory Rate: 16 breaths/min, Blood Pressure: 119/69 mmHg. Respiratory Normal work of breathing on room air.. General Notes: 06/26/2022: The right sided wound is about the same. It is tender but there is less periwound erythema. He still has slough on the wound surface. Integumentary (Hair, Skin) Wound #5 status is Open. Original cause of wound was Trauma. The date acquired was: 06/05/2022. The wound has been in treatment 1 weeks. The wound is located on the Right Lower Leg. The wound measures 2.2cm length x 2.9cm width x 0.1cm depth; 5.011cm^2 area and 0.501cm^3 volume. There is Fat Layer (Subcutaneous Tissue) exposed. There is no tunneling or undermining noted. There is a medium amount of serosanguineous drainage noted. The wound margin is distinct with the outline attached to the wound base. There is large (67-100%) red granulation within the wound bed. There is a small (1-33%) amount of necrotic tissue within the wound bed including Adherent Slough. Assessment Active Problems ICD-10 Type 2 diabetes mellitus with other skin ulcer Peripheral vascular disease, unspecified Venous insufficiency (chronic) (peripheral) Non-pressure chronic ulcer of other part of right lower leg with fat layer exposed Procedures Wound #5 Pre-procedure diagnosis of Wound #5 is a Trauma, Other located on the Right Lower Leg . There was a Excisional Skin/Subcutaneous Tissue Debridement with a total area of 6.38 sq cm performed by Fredirick Maudlin, MD. With the following instrument(s): Curette to remove Viable and Non-Viable  tissue/material. Material removed includes Subcutaneous Tissue and Slough and after achieving pain control using Lidocaine 4% T opical Solution. 1 specimen was taken by a Tissue Culture and sent to the lab per facility protocol. A time out was conducted at 08:48, prior to the start of the procedure. A Minimum amount of bleeding was controlled with Pressure. The procedure was tolerated well with a pain level of 4 throughout and a pain level of 0 following the procedure. Post Debridement Measurements: 2.2cm length x 2.9cm width x 0.1cm depth; 0.501cm^3 volume. Character of Wound/Ulcer Post Debridement is improved. Post procedure Diagnosis Wound #5: Same as Pre-Procedure Plan Follow-up Appointments: Return Appointment in 1 week. - Dr Celine Ahr - Room 2 - Bathing/ Shower/ Hygiene: May shower with protection but do not get wound dressing(s) wet. - Please do not get Left Leg wet/damp. Edema Control - Lymphedema / SCD / Other: Avoid standing for long periods of time. Exercise regularly Moisturize legs daily. WOUND #5: - Lower Leg Wound Laterality: Right Cleanser: Soap and Water 1 x Per Week/30 Days Discharge Instructions: May shower and wash wound with dial antibacterial soap and water prior to dressing change. Cleanser: Wound Cleanser 1 x Per Week/30 Days Discharge Instructions: Cleanse the wound with wound cleanser prior to applying a clean dressing using gauze sponges, not tissue or cotton balls. Peri-Wound Care: Triamcinolone 15 (g) 1 x Per Week/30 Days Discharge Instructions: Use triamcinolone 15 (g) as directed Peri-Wound Care: Sween Lotion (Moisturizing lotion) 1 x Per Week/30 Days Discharge Instructions: Apply moisturizing lotion as directed Topical: Mupirocin Ointment 1 x Per Week/30  Days Discharge Instructions: Apply Mupirocin (Bactroban) as instructed Prim Dressing: KerraCel Ag Gelling Fiber Dressing, 2x2 in (silver alginate) 1 x Per Week/30 Days ary Discharge Instructions: Apply silver  alginate to wound bed as instructed Secondary Dressing: Woven Gauze Sponge, Non-Sterile 4x4 in 1 x Per Week/30 Days Discharge Instructions: Apply over primary dressing as directed. Secured With: 108M Medipore Public affairs consultant Surgical T 2x10 (in/yd) 1 x Per Week/30 Days ape Discharge Instructions: Secure with tape as directed. Com pression Wrap: Kerlix Roll 4.5x3.1 (in/yd) 1 x Per Week/30 Days Discharge Instructions: Apply Kerlix and Coban compression as directed. Com pression Wrap: Coban Self-Adherent Wrap 4x5 (in/yd) 1 x Per Week/30 Days Discharge Instructions: Apply over Kerlix as directed. 06/26/2022: The right sided wound is about the same. It is tender but there is less periwound erythema. He still has slough on the wound surface. I used a curette to debride slough and nonviable subcutaneous tissue from the wound. Due to his continued tenderness despite oral and topical antibiotic therapy, I took a culture to confirm that we are treating him appropriately. I will tailor any antibiotic therapy based upon the culture data. We will continue using topical mupirocin with silver alginate and Kerlix/Coban wrap. Follow-up in 1 week. Electronic Signature(s) Signed: 06/26/2022 8:55:28 AM By: Fredirick Maudlin MD FACS Entered By: Fredirick Maudlin on 06/26/2022 08:55:28 -------------------------------------------------------------------------------- HxROS Details Patient Name: Date of Service: MO [name], Elder Marcus T. 06/26/2022 8:30 A M Medical Record Number: [id number] Patient Account Number: [id number] Date of Birth/Sex: Treating RN: Apr 29, 1951 (71 y.o. [name] Primary Care Provider: [name] Other Clinician: Referring Provider: Treating Provider/Extender: [name] Weeks in Treatment: 10 Information Obtained From Patient Eyes Medical History: Positive for: Cataracts - both eyes Cardiovascular Medical History: Positive for: Arrhythmia - A-fib; Coronary Artery  Disease; Hypertension; Myocardial Infarction - CABG x 4; Peripheral Arterial Disease; Peripheral Venous Disease Past Medical History Notes: Hyperlipidemia, CVA, AAA Endocrine Medical History: Positive for: Type II Diabetes Time with diabetes: over 5 years Treated with: Oral agents Blood sugar tested every day: No Musculoskeletal Medical History: Positive for: Osteoarthritis Neurologic Medical History: Positive for: Neuropathy Oncologic Medical History: Positive for: Received Radiation Past Medical History Notes: Prostate cancer HBO Extended History Items Eyes: Cataracts Immunizations Pneumococcal Vaccine: Received Pneumococcal Vaccination: Yes Received Pneumococcal Vaccination On or After 60th Birthday: Yes Implantable Devices None Family and Social History Current every day smoker - 1 PPD; Marital Status - Married; Alcohol Use: Rarely; Drug Use: No History; Caffeine Use: Daily - coffee, soda; Financial Concerns: No; Food, Clothing or Shelter Needs: No; Support System Lacking: No; Transportation Concerns: No Electronic Signature(s) Signed: 06/26/2022 11:38:54 AM By: Fredirick Maudlin MD FACS Signed: 06/28/2022 5:10:18 PM By: Adline Peals Entered By: Fredirick Maudlin on 06/26/2022 08:53:31 -------------------------------------------------------------------------------- SuperBill Details Patient Name: Date of Service: MO [name], Elder Marcus T. 06/26/2022 Medical Record Number: [id number] Patient Account Number: [id number] Date of Birth/Sex: Treating RN: 06/12/1951 (71 y.o. [name] Primary Care Provider: [name] Other Clinician: Referring Provider: Treating Provider/Extender: [name] Weeks in Treatment: 10 Diagnosis Coding ICD-10 Codes Code Description E11.622 Type 2 diabetes mellitus with other skin ulcer I73.9 Peripheral vascular disease, unspecified I87.2 Venous insufficiency (chronic) (peripheral) L97.812 Non-pressure  chronic ulcer of other part of right lower leg with fat layer exposed Facility Procedures CPT4 Code: 22297989 Description: 21194 - DEB SUBQ TISSUE 20 SQ CM/< ICD-10 Diagnosis Description L97.812 Non-pressure chronic ulcer of other part of right lower leg with fat layer exp Modifier:  osed Quantity: 1 Physician Procedures : CPT4 Code Description Modifier [id number] 08168 - WC PHYS LEVEL 3 - EST PT 25 ICD-10 Diagnosis Description H87.065 Non-pressure chronic ulcer of other part of right lower leg with fat layer exposed E11.622 Type 2 diabetes mellitus with other skin ulcer  I73.9 Peripheral vascular disease, unspecified I87.2 Venous insufficiency (chronic) (peripheral) Quantity: 1 : [id number] 11042 - WC PHYS SUBQ TISS 20 SQ CM ICD-10 Diagnosis Description ICD-10 Diagnosis Description H58.446 Non-pressure chronic ulcer of other part of right lower leg with fat layer exposed Quantity: 1 Electronic Signature(s) Signed: 06/26/2022 8:56:42 AM By: Fredirick Maudlin MD FACS Entered By: Fredirick Maudlin on 06/26/2022 08:56:42

## 2022-06-29 ENCOUNTER — Other Ambulatory Visit: Payer: Self-pay | Admitting: Thoracic Surgery (Cardiothoracic Vascular Surgery)

## 2022-06-29 DIAGNOSIS — R911 Solitary pulmonary nodule: Secondary | ICD-10-CM

## 2022-07-03 ENCOUNTER — Encounter (HOSPITAL_BASED_OUTPATIENT_CLINIC_OR_DEPARTMENT_OTHER): Payer: PPO | Admitting: General Surgery

## 2022-07-03 DIAGNOSIS — E11622 Type 2 diabetes mellitus with other skin ulcer: Secondary | ICD-10-CM | POA: Diagnosis not present

## 2022-07-03 DIAGNOSIS — L97812 Non-pressure chronic ulcer of other part of right lower leg with fat layer exposed: Secondary | ICD-10-CM | POA: Diagnosis not present

## 2022-07-03 NOTE — Progress Notes (Signed)
BRESLIN, HEMANN ([id number]) . Visit Report for 07/03/2022 Arrival Information Details Patient Name: Date of Service: [name] 07/03/2022 7:45 A M Medical Record Number: [id number] Patient Account Number: [id number] Date of Birth/Sex: Treating RN: 1951/01/08 (71 y.o. [name] Primary Care Dezman Granda: [name] Other Clinician: Referring Saintclair Schroader: Treating Joran Kallal/Extender: Casandra Doffing in Treatment: 11 Visit Information History Since Last Visit Added or deleted any medications: No Patient Arrived: Ambulatory Any new allergies or adverse reactions: No Arrival Time: 07:41 Had a fall or experienced change in No Accompanied By: self activities of daily living that may affect Transfer Assistance: None risk of falls: Patient Identification Verified: Yes Signs or symptoms of abuse/neglect since last visito No Secondary Verification Process Completed: Yes Hospitalized since last visit: No Patient Requires Transmission-Based Precautions: No Implantable device outside of the clinic excluding No Patient Has Alerts: Yes cellular tissue based products placed in the center Patient Alerts: Patient on Blood Thinner since last visit: ABI R 1.11 Has Dressing in Place as Prescribed: Yes ABI L 0.72 Has Compression in Place as Prescribed: Yes TBI L 0.33 Pain Present Now: No Electronic Signature(s) Signed: 07/03/2022 5:43:50 PM By: Adline Peals Entered By: Adline Peals on 07/03/2022 07:44:07 -------------------------------------------------------------------------------- Encounter Discharge Information Details Patient Name: Date of Service: [name], [name] 07/03/2022 7:45 A M Medical Record Number: [id number] Patient Account Number: [id number] Date of Birth/Sex: Treating RN: January 08, 1951 (71 y.o. [name] Primary Care Momo Braun: [name] Other Clinician: Referring Millena Callins: Treating Domini Vandehei/Extender:  Casandra Doffing in Treatment: 11 Encounter Discharge Information Items Post Procedure Vitals Discharge Condition: Stable Temperature (F): 97.6 Ambulatory Status: Ambulatory Pulse (bpm): 79 Discharge Destination: Home Respiratory Rate (breaths/min): 16 Transportation: Private Auto Blood Pressure (mmHg): 133/74 Accompanied By: self Schedule Follow-up Appointment: Yes Clinical Summary of Care: Patient Declined Electronic Signature(s) Signed: 07/03/2022 5:43:50 PM By: Adline Peals Entered By: Adline Peals on 07/03/2022 08:15:16 -------------------------------------------------------------------------------- Lower Extremity Assessment Details Patient Name: Date of Service: [name] 07/03/2022 7:45 A M Medical Record Number: [id number] Patient Account Number: [id number] Date of Birth/Sex: Treating RN: 1951-05-06 (71 y.o. [name] Primary Care Yossi Hinchman: [name] Other Clinician: Referring Suzzette Gasparro: Treating Amelianna Meller/Extender: [name] Weeks in Treatment: 11 Edema Assessment Assessed: Shirlyn Goltz: No] [Right: No] E[Left: dema] [Right: :] Calf Left: Right: Point of Measurement: 34 cm From Medial Instep 36.1 cm 40.5 cm Ankle Left: Right: Point of Measurement: 12 cm From Medial Instep 25.5 cm 24.6 cm Vascular Assessment Pulses: Dorsalis Pedis Palpable: [Right:Yes] Electronic Signature(s) Signed: 07/03/2022 5:43:50 PM By: Adline Peals Entered By: Adline Peals on 07/03/2022 07:52:52 -------------------------------------------------------------------------------- Multi Wound Chart Details Patient Name: Date of Service: [name], [name] 07/03/2022 7:45 A M Medical Record Number: [id number] Patient Account Number: [id number] Date of Birth/Sex: Treating RN: 09/23/1951 (71 y.o. [name] Primary Care Deetta Siegmann: [name] Other Clinician: Referring Gradyn Shein: Treating  Brenin Heidelberger/Extender: [name] Weeks in Treatment: 11 Vital Signs Height(in): 70 Pulse(bpm): 64 Weight(lbs): 219 Blood Pressure(mmHg): 133/74 Body Mass Index(BMI): 31.4 Temperature(F): 97.6 Respiratory Rate(breaths/min): 16 Photos: [N/A:N/A] Right Lower Leg N/A N/A Wound Location: Trauma N/A N/A Wounding Event: Trauma, Other N/A N/A Primary Etiology: Cataracts, Arrhythmia, Coronary N/A N/A Comorbid History: Artery Disease, Hypertension, Myocardial Infarction, Peripheral Arterial Disease, Peripheral Venous Disease, Type II Diabetes, Osteoarthritis, Neuropathy, Received Radiation 06/05/2022 N/A N/A Date Acquired: 2 N/A N/A Weeks of Treatment: Open N/A N/A Wound Status: No N/A N/A  Wound Recurrence: 2x3.1x0.1 N/A N/A Measurements L x W x D (cm) 4.869 N/A N/A A (cm) : rea 0.487 N/A N/A Volume (cm) : 27.10% N/A N/A % Reduction in A rea: 27.10% N/A N/A % Reduction in Volume: Full Thickness Without Exposed N/A N/A Classification: Support Structures Medium N/A N/A Exudate A mount: Serosanguineous N/A N/A Exudate Type: red, brown N/A N/A Exudate Color: Distinct, outline attached N/A N/A Wound Margin: Large (67-100%) N/A N/A Granulation A mount: Red N/A N/A Granulation Quality: Small (1-33%) N/A N/A Necrotic A mount: Fat Layer (Subcutaneous Tissue): Yes N/A N/A Exposed Structures: Fascia: No Tendon: No Muscle: No Joint: No Bone: No Small (1-33%) N/A N/A Epithelialization: Debridement - Selective/Open Wound N/A N/A Debridement: Pre-procedure Verification/Time Out 08:01 N/A N/A Taken: Lidocaine 4% Topical Solution N/A N/A Pain Control: Slough N/A N/A Tissue Debrided: Non-Viable Tissue N/A N/A Level: 6.2 N/A N/A Debridement A (sq cm): rea Curette N/A N/A Instrument: Minimum N/A N/A Bleeding: Pressure N/A N/A Hemostasis A chieved: 0 N/A N/A Procedural Pain: 0 N/A N/A Post Procedural Pain: Procedure was tolerated  well N/A N/A Debridement Treatment Response: 2x3.1x0.1 N/A N/A Post Debridement Measurements L x W x D (cm) 0.487 N/A N/A Post Debridement Volume: (cm) Debridement N/A N/A Procedures Performed: Treatment Notes Wound #5 (Lower Leg) Wound Laterality: Right Cleanser Soap and Water Discharge Instruction: May shower and wash wound with dial antibacterial soap and water prior to dressing change. Wound Cleanser Discharge Instruction: Cleanse the wound with wound cleanser prior to applying a clean dressing using gauze sponges, not tissue or cotton balls. Peri-Wound Care Triamcinolone 15 (g) Discharge Instruction: Use triamcinolone 15 (g) as directed Sween Lotion (Moisturizing lotion) Discharge Instruction: Apply moisturizing lotion as directed Topical Mupirocin Ointment Discharge Instruction: Apply Mupirocin (Bactroban) as instructed Primary Dressing KerraCel Ag Gelling Fiber Dressing, 2x2 in (silver alginate) Discharge Instruction: Apply silver alginate to wound bed as instructed Secondary Dressing Woven Gauze Sponge, Non-Sterile 4x4 in Discharge Instruction: Apply over primary dressing as directed. Secured With [employer] Surgical T 2x10 (in/yd) ape Discharge Instruction: Secure with tape as directed. Compression Wrap Kerlix Roll 4.5x3.1 (in/yd) Discharge Instruction: Apply Kerlix and Coban compression as directed. Coban Self-Adherent Wrap 4x5 (in/yd) Discharge Instruction: Apply over Kerlix as directed. Compression Stockings Add-Ons Electronic Signature(s) Signed: 07/03/2022 8:19:00 AM By: Fredirick Maudlin MD FACS Signed: 07/03/2022 5:43:50 PM By: Sabas Sous By: Fredirick Maudlin on 07/03/2022 08:19:00 -------------------------------------------------------------------------------- Multi-Disciplinary Care Plan Details Patient Name: Date of Service: [name], [name] 07/03/2022 7:45 A M Medical Record Number: [id number] Patient Account Number:  [id number] Date of Birth/Sex: Treating RN: 05/24/1951 (71 y.o. [name] Primary Care Darvin Dials: [name] Other Clinician: Referring Sarah Zerby: Treating Story Conti/Extender: [name] Weeks in Treatment: 11 Active Inactive Wound/Skin Impairment Nursing Diagnoses: Impaired tissue integrity Goals: Patient/caregiver will verbalize understanding of skin care regimen Date Initiated: 04/17/2022 Target Resolution Date: 07/21/2022 Goal Status: Active Interventions: Assess ulceration(s) every visit Treatment Activities: Skin care regimen initiated : 04/17/2022 Notes: Electronic Signature(s) Signed: 07/03/2022 5:43:50 PM By: Adline Peals Entered By: Adline Peals on 07/03/2022 07:54:12 -------------------------------------------------------------------------------- Pain Assessment Details Patient Name: Date of Service: [name] 07/03/2022 7:45 A M Medical Record Number: [id number] Patient Account Number: [id number] Date of Birth/Sex: Treating RN: [birth year] (71 y.o. [name] Primary Care Puja Caffey: [name] Other Clinician: Referring Cameron Katayama: Treating Pearl Berlinger/Extender: [name] Weeks in Treatment: 11 Active Problems Location of Pain Severity and Description of Pain Patient Has Paino  No Site Locations Rate the pain. Current Pain Level: 0 Pain Management and Medication Current Pain Management: Electronic Signature(s) Signed: 07/03/2022 5:43:50 PM By: Adline Peals Entered By: Adline Peals on 07/03/2022 07:44:32 -------------------------------------------------------------------------------- Patient/Caregiver Education Details Patient Name: Date of Service: [name] 7/10/2023andnbsp7:45 A M Medical Record Number: [id number] Patient Account Number: [id number] Date of Birth/Gender: Treating RN: 1951-01-05 (71 y.o. [name] Primary Care  Physician: [name] Other Clinician: Referring Physician: Treating Physician/Extender: Casandra Doffing in Treatment: 11 Education Assessment Education Provided To: Patient Education Topics Provided Wound/Skin Impairment: Methods: Explain/Verbal Responses: Reinforcements needed, State content correctly Electronic Signature(s) Signed: 07/03/2022 5:43:50 PM By: Adline Peals Entered By: Adline Peals on 07/03/2022 07:54:27 -------------------------------------------------------------------------------- Wound Assessment Details Patient Name: Date of Service: [name] 07/03/2022 7:45 A M Medical Record Number: [id number] Patient Account Number: [id number] Date of Birth/Sex: Treating RN: 08/01/1951 (71 y.o. [name] Primary Care Paradise Vensel: [name] Other Clinician: Referring Katie Faraone: Treating Kmari Halter/Extender: [name] Weeks in Treatment: 11 Wound Status Wound Number: 5 Primary Trauma, Other Etiology: Wound Location: Right Lower Leg Wound Open Wounding Event: Trauma Status: Date Acquired: 06/05/2022 Comorbid Cataracts, Arrhythmia, Coronary Artery Disease, Hypertension, Weeks Of Treatment: 2 History: Myocardial Infarction, Peripheral Arterial Disease, Peripheral Venous Clustered Wound: No Disease, Type II Diabetes, Osteoarthritis, Neuropathy, Received Radiation Photos Wound Measurements Length: (cm) 2 Width: (cm) 3.1 Depth: (cm) 0.1 Area: (cm) 4.869 Volume: (cm) 0.487 % Reduction in Area: 27.1% % Reduction in Volume: 27.1% Epithelialization: Small (1-33%) Tunneling: No Undermining: No Wound Description Classification: Full Thickness Without Exposed Support Structures Wound Margin: Distinct, outline attached Exudate Amount: Medium Exudate Type: Serosanguineous Exudate Color: red, brown Foul Odor After Cleansing: No Slough/Fibrino Yes Wound Bed Granulation Amount:  Large (67-100%) Exposed Structure Granulation Quality: Red Fascia Exposed: No Necrotic Amount: Small (1-33%) Fat Layer (Subcutaneous Tissue) Exposed: Yes Necrotic Quality: Adherent Slough Tendon Exposed: No Muscle Exposed: No Joint Exposed: No Bone Exposed: No Treatment Notes Wound #5 (Lower Leg) Wound Laterality: Right Cleanser Soap and Water Discharge Instruction: May shower and wash wound with dial antibacterial soap and water prior to dressing change. Wound Cleanser Discharge Instruction: Cleanse the wound with wound cleanser prior to applying a clean dressing using gauze sponges, not tissue or cotton balls. Peri-Wound Care Triamcinolone 15 (g) Discharge Instruction: Use triamcinolone 15 (g) as directed Sween Lotion (Moisturizing lotion) Discharge Instruction: Apply moisturizing lotion as directed Topical Mupirocin Ointment Discharge Instruction: Apply Mupirocin (Bactroban) as instructed Primary Dressing KerraCel Ag Gelling Fiber Dressing, 2x2 in (silver alginate) Discharge Instruction: Apply silver alginate to wound bed as instructed Secondary Dressing Woven Gauze Sponge, Non-Sterile 4x4 in Discharge Instruction: Apply over primary dressing as directed. Secured With [employer] Surgical T 2x10 (in/yd) ape Discharge Instruction: Secure with tape as directed. Compression Wrap Kerlix Roll 4.5x3.1 (in/yd) Discharge Instruction: Apply Kerlix and Coban compression as directed. Coban Self-Adherent Wrap 4x5 (in/yd) Discharge Instruction: Apply over Kerlix as directed. Compression Stockings Add-Ons Electronic Signature(s) Signed: 07/03/2022 5:43:50 PM By: Adline Peals Entered By: Adline Peals on 07/03/2022 07:56:02 -------------------------------------------------------------------------------- Vitals Details Patient Name: Date of Service: [name], [name] 07/03/2022 7:45 A M Medical Record Number: [id number] Patient Account Number:  [id number] Date of Birth/Sex: Treating RN: April 07, 1951 (71 y.o. [name] Primary Care Carlie Corpus: [name] Other Clinician: Referring Cayce Quezada: Treating Gustavo Meditz/Extender: [name] Weeks in Treatment: 11 Vital Signs Time Taken: 07:44 Temperature (F): 97.6 Height (in): 70 Pulse (  bpm): 79 Weight (lbs): 219 Respiratory Rate (breaths/min): 16 Body Mass Index (BMI): 31.4 Blood Pressure (mmHg): 133/74 Reference Range: 80 - 120 mg / dl Electronic Signature(s) Signed: 07/03/2022 5:43:50 PM By: Adline Peals Entered By: Adline Peals on 07/03/2022 07:44:24

## 2022-07-03 NOTE — Progress Notes (Signed)
Marcus, [name] ([id number]) . Visit Report for 07/03/2022 Chief Complaint Document Details Patient Name: Date of Service: [name]. 07/03/2022 7:45 A M Medical Record Number: [id number] Patient Account Number: [id number] Date of Birth/Sex: Treating RN: 09/24/1951 (71 y.o. [name] Primary Care Provider: [name] Other Clinician: Referring Provider: Treating Provider/Extender: Casandra Doffing in Treatment: 11 Information Obtained from: Patient Chief Complaint Patient seen for complaints of Non-Healing Wounds. Electronic Signature(s) Signed: 07/03/2022 8:19:08 AM By: Fredirick Maudlin MD FACS Entered By: Fredirick Maudlin on 07/03/2022 08:19:08 -------------------------------------------------------------------------------- Debridement Details Patient Name: Date of Service: [name], [name] 07/03/2022 7:45 A M Medical Record Number: [id number] Patient Account Number: [id number] Date of Birth/Sex: Treating RN: 1951-09-07 (71 y.o. [name] Primary Care Provider: [name] Other Clinician: Referring Provider: Treating Provider/Extender: Casandra Doffing in Treatment: 11 Debridement Performed for Assessment: Wound #5 Right Lower Leg Performed By: Physician Fredirick Maudlin, MD Debridement Type: Debridement Level of Consciousness (Pre-procedure): Awake and Alert Pre-procedure Verification/Time Out Yes - 08:01 Taken: Start Time: 08:01 Pain Control: Lidocaine 4% T opical Solution T Area Debrided (L x W): otal 2 (cm) x 3.1 (cm) = 6.2 (cm) Tissue and other material debrided: Non-Viable, Slough, Slough Level: Non-Viable Tissue Debridement Description: Selective/Open Wound Instrument: Curette Bleeding: Minimum Hemostasis Achieved: Pressure Procedural Pain: 0 Post Procedural Pain: 0 Response to Treatment: Procedure was tolerated well Level of Consciousness (Post- Awake and  Alert procedure): Post Debridement Measurements of Total Wound Length: (cm) 2 Width: (cm) 3.1 Depth: (cm) 0.1 Volume: (cm) 0.487 Character of Wound/Ulcer Post Debridement: Improved Post Procedure Diagnosis Same as Pre-procedure Electronic Signature(s) Signed: 07/03/2022 12:30:27 PM By: Fredirick Maudlin MD FACS Signed: 07/03/2022 5:43:50 PM By: Adline Peals Entered By: Adline Peals on 07/03/2022 08:02:36 -------------------------------------------------------------------------------- HPI Details Patient Name: Date of Service: [name], [name] 07/03/2022 7:45 A M Medical Record Number: [id number] Patient Account Number: [id number] Date of Birth/Sex: Treating RN: [birth year] (71 y.o. [name] Primary Care Provider: [name] Other Clinician: Referring Provider: Treating Provider/Extender: Casandra Doffing in Treatment: 11 History of Present Illness HPI Description: 11/10/2020 patient presents today for initial evaluation in this clinic although I have seen this patient in [place] previous. Subsequently his issue today is different from when I saw him for I was treating him for a toe ulcer. Currently he is having issues with bilateral lower extremity ulcers after running into a metal bar. Upon inspection today he has wounds of the bilateral lower extremities which are consistent with having struck his legs on the anterior aspect. With that being said the patient did go to the ER for evaluation on October 11 where he was given Keflex initially. He went back to the ER after not improving on October 20 he was given Lasix at that point he states the Lasix seem to help the most. Has been using Neosporin and leaving this open area which is probably not the best way to go either to be honest. He has had arterial studies on March 2021 which showed a left ABI of 0.78 with a TBI of 0.34 and a right ABI of 1.18 with a TBI 1.18. Nonetheless his  arterial flow the left is not ideal but also I think potentially consistent with allowing this to heal. I think he can also support light compression with Kerlix and Coban based on this result. His most recent hemoglobin A1c that we could find was 7.3  and that was towards the end of 2020. The patient does have a history of heart disease unfortunately. He is also currently still a smoker. 11/24/2020 on evaluation today patient appears to be doing well with regard to his wounds. In fact both appear to be doing better after last week's rather last visit's debridement. Fortunately there is no signs of active infection at this time. No fevers, chills, nausea, vomiting, or diarrhea. Overall I am very pleased with where things stand today. 12/01/2020 on evaluation today patient appears to be doing well with regard to his leg ulcers. He has been tolerating the dressing changes without complication. Fortunately there is no signs of active infection at this time. No fevers, chills, nausea, vomiting, or diarrhea. 12/08/2020 on evaluation today patient appears to be doing very well in regard to his bilateral lower extremity ulcers. They seem to be doing excellent and he is making great progress. There does not appear to be any evidence of active infection at this time. No fevers, chills, nausea, vomiting, or diarrhea. 12/15/2020 upon evaluation today patient actually appears to be doing excellent in regard to his wounds. Has been tolerating the dressing changes without complication. Fortunately his wounds are showing signs of great improvement were using Hydrofera Blue. 12/29/2020 on evaluation today patient appears to be doing well with regard to his right leg which is completely healed. His left leg is also measuring smaller than not healed this is doing very well. Fortunately there is no signs of active infection at this time 01/05/2021 upon evaluation today patient actually appears to be making signs of improvement  with regard to his left leg. I am very pleased with how things are progressing. There is no evidence of active infection at this time. No fevers, chills, nausea, vomiting, or diarrhea. Patient is extremely happy with the fact that this is doing so well 01/12/2021 upon evaluation today patient appears to be doing excellent in regard to his leg ulcer. Fortunately there is no signs of active infection at this time. No fevers, chills, nausea, vomiting, or diarrhea. He has been tolerating the dressing changes without complication. 01/19/2021 upon evaluation today patient appears to be doing well with regard to his wound. In fact this appears to be completely healed on the left leg. This is brand-new skin but overall seems to be doing quite well which is great news. No fevers, chills, nausea, vomiting, or diarrhea. READMISSION 04/17/2022 This is a patient has been seen on a number of occasions in the wound care center. He is 71 years old and has type 2 diabetes mellitus, coronary artery disease, chronic venous insufficiency, and peripheral arterial disease with recent vascular studies demonstrating a right TBI of 0.9 (normal) and a left great toe TBI of 0.33. 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 He recently dropped an object and it hit him in the left shin. He then bumped the same leg against his wagon. He now has 2 wounds on his left anterior lower leg. He says that he applied Aquaphor to keep the areas moist and made an appointment in the wound care center for follow-up. 04/24/2022: Today, he is here with what he describes as 10 out of 10 pain in his wounds. Both of them measured a little bit larger today, but this may be secondary to the debridement that was performed last week. He says that due to it being spring season, he is on his feet quite a bit more. The leg is red and warm.  We have been using silver alginate with Kerlix and Coban wraps. 05/01/2022: PCR culture taken last week grew  out MRSA. I prescribed doxycycline. He has been taking this. Both wounds are a bit smaller today and somewhat less painful. They continue to accumulate a bit of slough. 05/08/2022: He has completed his course of oral doxycycline. We have been using mupirocin under silver alginate with Kerlix and Coban wrapping. Today, both wounds are a bit smaller but have accumulated slough on the surface. They are less tender. 05/23/2022: Both wounds are quite a bit more shallow today. There is a little bit of slough accumulation, but there is also good granulation tissue as well as some buds of epithelialized tissue coming through the wound. No surrounding erythema, induration, nor any purulent drainage. We have been using mupirocin with silver alginate and Kerlix/Coban wrapping. 05/29/2022: The more anterior wound continues to demonstrate encroaching epithelium and the remaining open portion has good granulation tissue. The more lateral wound is nearly closed with just a pinpoint opening. No concern for infection. 06/05/2022: The lateral wound has healed. The more anterior wound is much smaller today with just a little bit of surrounding eschar. The wound surface is healthy-appearing with good granulation tissue. 06/13/2022: The wound on the anterior tibial surface on the left is nearly closed. There is just a tiny opening underneath some eschar. Unfortunately, his dog jumped up on him and cut his right lower leg. There is a flap of nonviable skin with some hematoma and bruising. No surrounding erythema or induration. No purulent discharge. 06/19/2022: The wound on his left leg is closed. The right sided wound is smaller, but there is significant periwound erythema and the leg is more tender. There is slough accumulation on the wound surface. 06/26/2022: The right sided wound is about the same. It is tender but there is less periwound erythema. He still has slough on the wound surface. He has been taking Bactrim as  prescribed. 07/03/2022: The PCR culture that I took demonstrated bacteria not sensitive to Bactrim so he was prescribed levofloxacin, as supported by the culture data. He has been taking that and there has been significant improvement in his degree of pain. The wound itself is smaller with less slough accumulation. There is good granulation tissue emerging. Electronic Signature(s) Signed: 07/03/2022 8:20:57 AM By: Fredirick Maudlin MD FACS Entered By: Fredirick Maudlin on 07/03/2022 08:20:57 -------------------------------------------------------------------------------- Physical Exam Details Patient Name: Date of Service: Marcus Milliner T. 07/03/2022 7:45 A M Medical Record Number: [id number] Patient Account Number: [id number] Date of Birth/Sex: Treating RN: September 04, 1951 (71 y.o. [name] Primary Care Provider: [name] Other Clinician: Referring Provider: Treating Provider/Extender: [name] Weeks in Treatment: 11 Constitutional . . . . No acute distress.Marland Kitchen Respiratory Normal work of breathing on room air.. Notes 07/03/2022: The wound is smaller with less slough accumulation. There is good granulation tissue emerging. Electronic Signature(s) Signed: 07/03/2022 8:21:38 AM By: Fredirick Maudlin MD FACS Entered By: Fredirick Maudlin on 07/03/2022 08:21:38 -------------------------------------------------------------------------------- Physician Orders Details Patient Name: Date of Service: [name], [name] 07/03/2022 7:45 A M Medical Record Number: [id number] Patient Account Number: [id number] Date of Birth/Sex: Treating RN: Mar 21, 1951 (71 y.o. [name] Primary Care Provider: [name] Other Clinician: Referring Provider: Treating Provider/Extender: Casandra Doffing in Treatment: 11 Verbal / Phone Orders: No Diagnosis Coding ICD-10 Coding Code Description E11.622 Type 2 diabetes mellitus with other  skin ulcer I73.9 Peripheral vascular disease, unspecified I87.2 Venous  insufficiency (chronic) (peripheral) L97.812 Non-pressure chronic ulcer of other part of right lower leg with fat layer exposed Follow-up Appointments ppointment in 1 week. - Dr Celine Ahr - Room 2 - 7/17 at 8:30 AM Return A Bathing/ Shower/ Hygiene May shower with protection but do not get wound dressing(s) wet. - Please do not get Left Leg wet/damp. Edema Control - Lymphedema / SCD / Other Bilateral Lower Extremities Avoid standing for long periods of time. Exercise regularly Moisturize legs daily. Wound Treatment Wound #5 - Lower Leg Wound Laterality: Right Cleanser: Soap and Water 1 x Per Week/30 Days Discharge Instructions: May shower and wash wound with dial antibacterial soap and water prior to dressing change. Cleanser: Wound Cleanser 1 x Per Week/30 Days Discharge Instructions: Cleanse the wound with wound cleanser prior to applying a clean dressing using gauze sponges, not tissue or cotton balls. Peri-Wound Care: Triamcinolone 15 (g) 1 x Per Week/30 Days Discharge Instructions: Use triamcinolone 15 (g) as directed Peri-Wound Care: Sween Lotion (Moisturizing lotion) 1 x Per Week/30 Days Discharge Instructions: Apply moisturizing lotion as directed Topical: Mupirocin Ointment 1 x Per Week/30 Days Discharge Instructions: Apply Mupirocin (Bactroban) as instructed Prim Dressing: KerraCel Ag Gelling Fiber Dressing, 2x2 in (silver alginate) 1 x Per Week/30 Days ary Discharge Instructions: Apply silver alginate to wound bed as instructed Secondary Dressing: Woven Gauze Sponge, Non-Sterile 4x4 in 1 x Per Week/30 Days Discharge Instructions: Apply over primary dressing as directed. Secured With: 71M Medipore Public affairs consultant Surgical T 2x10 (in/yd) 1 x Per Week/30 Days ape Discharge Instructions: Secure with tape as directed. Compression Wrap: Kerlix Roll 4.5x3.1 (in/yd) 1 x Per Week/30 Days Discharge Instructions: Apply  Kerlix and Coban compression as directed. Compression Wrap: Coban Self-Adherent Wrap 4x5 (in/yd) 1 x Per Week/30 Days Discharge Instructions: Apply over Kerlix as directed. Electronic Signature(s) Signed: 07/03/2022 12:30:27 PM By: Fredirick Maudlin MD FACS Entered By: Fredirick Maudlin on 07/03/2022 08:23:30 -------------------------------------------------------------------------------- Problem List Details Patient Name: Date of Service: [name], [name] 07/03/2022 7:45 A M Medical Record Number: [id number] Patient Account Number: [id number] Date of Birth/Sex: Treating RN: [birth year] (71 y.o. [name] Primary Care Provider: [name] Other Clinician: Referring Provider: Treating Provider/Extender: [name] Weeks in Treatment: 11 Active Problems ICD-10 Encounter Code Description Active Date MDM Diagnosis E11.622 Type 2 diabetes mellitus with other skin ulcer 04/17/2022 No Yes I73.9 Peripheral vascular disease, unspecified 04/17/2022 No Yes I87.2 Venous insufficiency (chronic) (peripheral) 04/17/2022 No Yes L97.812 Non-pressure chronic ulcer of other part of right lower leg with fat layer 06/13/2022 No Yes exposed Inactive Problems Resolved Problems ICD-10 Code Description Active Date Resolved Date L97.822 Non-pressure chronic ulcer of other part of left lower leg with fat layer exposed 04/17/2022 04/17/2022 Electronic Signature(s) Signed: 07/03/2022 8:18:53 AM By: Fredirick Maudlin MD FACS Entered By: Fredirick Maudlin on 07/03/2022 08:18:53 -------------------------------------------------------------------------------- Progress Note Details Patient Name: Date of Service: [name], [name] 07/03/2022 7:45 A M Medical Record Number: [id number] Patient Account Number: [id number] Date of Birth/Sex: Treating RN: 18-Oct-1951 (71 y.o. [name] Primary Care Provider: [name] Other Clinician: Referring Provider: Treating  Provider/Extender: Casandra Doffing in Treatment: 11 Subjective Chief Complaint Information obtained from Patient Patient seen for complaints of Non-Healing Wounds. History of Present Illness (HPI) 11/10/2020 patient presents today for initial evaluation in this clinic although I have seen this patient in [place] previous. Subsequently his issue today is different from when I saw him for I was treating him for  a toe ulcer. Currently he is having issues with bilateral lower extremity ulcers after running into a metal bar. Upon inspection today he has wounds of the bilateral lower extremities which are consistent with having struck his legs on the anterior aspect. With that being said the patient did go to the ER for evaluation on October 11 where he was given Keflex initially. He went back to the ER after not improving on October 20 he was given Lasix at that point he states the Lasix seem to help the most. Has been using Neosporin and leaving this open area which is probably not the best way to go either to be honest. He has had arterial studies on March 2021 which showed a left ABI of 0.78 with a TBI of 0.34 and a right ABI of 1.18 with a TBI 1.18. Nonetheless his arterial flow the left is not ideal but also I think potentially consistent with allowing this to heal. I think he can also support light compression with Kerlix and Coban based on this result. His most recent hemoglobin A1c that we could find was 7.3 and that was towards the end of 2020. The patient does have a history of heart disease unfortunately. He is also currently still a smoker. 11/24/2020 on evaluation today patient appears to be doing well with regard to his wounds. In fact both appear to be doing better after last week's rather last visit's debridement. Fortunately there is no signs of active infection at this time. No fevers, chills, nausea, vomiting, or diarrhea. Overall I am very pleased with where  things stand today. 12/01/2020 on evaluation today patient appears to be doing well with regard to his leg ulcers. He has been tolerating the dressing changes without complication. Fortunately there is no signs of active infection at this time. No fevers, chills, nausea, vomiting, or diarrhea. 12/08/2020 on evaluation today patient appears to be doing very well in regard to his bilateral lower extremity ulcers. They seem to be doing excellent and he is making great progress. There does not appear to be any evidence of active infection at this time. No fevers, chills, nausea, vomiting, or diarrhea. 12/15/2020 upon evaluation today patient actually appears to be doing excellent in regard to his wounds. Has been tolerating the dressing changes without complication. Fortunately his wounds are showing signs of great improvement were using Hydrofera Blue. 12/29/2020 on evaluation today patient appears to be doing well with regard to his right leg which is completely healed. His left leg is also measuring smaller than not healed this is doing very well. Fortunately there is no signs of active infection at this time 01/05/2021 upon evaluation today patient actually appears to be making signs of improvement with regard to his left leg. I am very pleased with how things are progressing. There is no evidence of active infection at this time. No fevers, chills, nausea, vomiting, or diarrhea. Patient is extremely happy with the fact that this is doing so well 01/12/2021 upon evaluation today patient appears to be doing excellent in regard to his leg ulcer. Fortunately there is no signs of active infection at this time. No fevers, chills, nausea, vomiting, or diarrhea. He has been tolerating the dressing changes without complication. 01/19/2021 upon evaluation today patient appears to be doing well with regard to his wound. In fact this appears to be completely healed on the left leg. This is brand-new skin but overall  seems to be doing quite well which is great news. No fevers,  chills, nausea, vomiting, or diarrhea. READMISSION 04/17/2022 This is a patient has been seen on a number of occasions in the wound care center. He is 71 years old and has type 2 diabetes mellitus, coronary artery disease, chronic venous insufficiency, and peripheral arterial disease with recent vascular studies demonstrating a right TBI of 0.9 (normal) and a left great toe TBI of 0.33. 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 He recently dropped an object and it hit him in the left shin. He then bumped the same leg against his wagon. He now has 2 wounds on his left anterior lower leg. He says that he applied Aquaphor to keep the areas moist and made an appointment in the wound care center for follow-up. 04/24/2022: Today, he is here with what he describes as 10 out of 10 pain in his wounds. Both of them measured a little bit larger today, but this may be secondary to the debridement that was performed last week. He says that due to it being spring season, he is on his feet quite a bit more. The leg is red and warm. We have been using silver alginate with Kerlix and Coban wraps. 05/01/2022: PCR culture taken last week grew out MRSA. I prescribed doxycycline. He has been taking this. Both wounds are a bit smaller today and somewhat less painful. They continue to accumulate a bit of slough. 05/08/2022: He has completed his course of oral doxycycline. We have been using mupirocin under silver alginate with Kerlix and Coban wrapping. Today, both wounds are a bit smaller but have accumulated slough on the surface. They are less tender. 05/23/2022: Both wounds are quite a bit more shallow today. There is a little bit of slough accumulation, but there is also good granulation tissue as well as some buds of epithelialized tissue coming through the wound. No surrounding erythema, induration, nor any purulent drainage. We have been using  mupirocin with silver alginate and Kerlix/Coban wrapping. 05/29/2022: The more anterior wound continues to demonstrate encroaching epithelium and the remaining open portion has good granulation tissue. The more lateral wound is nearly closed with just a pinpoint opening. No concern for infection. 06/05/2022: The lateral wound has healed. The more anterior wound is much smaller today with just a little bit of surrounding eschar. The wound surface is healthy-appearing with good granulation tissue. 06/13/2022: The wound on the anterior tibial surface on the left is nearly closed. There is just a tiny opening underneath some eschar. Unfortunately, his dog jumped up on him and cut his right lower leg. There is a flap of nonviable skin with some hematoma and bruising. No surrounding erythema or induration. No purulent discharge. 06/19/2022: The wound on his left leg is closed. The right sided wound is smaller, but there is significant periwound erythema and the leg is more tender. There is slough accumulation on the wound surface. 06/26/2022: The right sided wound is about the same. It is tender but there is less periwound erythema. He still has slough on the wound surface. He has been taking Bactrim as prescribed. 07/03/2022: The PCR culture that I took demonstrated bacteria not sensitive to Bactrim so he was prescribed levofloxacin, as supported by the culture data. He has been taking that and there has been significant improvement in his degree of pain. The wound itself is smaller with less slough accumulation. There is good granulation tissue emerging. Patient History Information obtained from Patient. Social History Current every day smoker - 1 PPD, Marital Status - Married,  Alcohol Use - Rarely, Drug Use - No History, Caffeine Use - Daily - coffee, soda. Medical History Eyes Patient has history of Cataracts - both eyes Cardiovascular Patient has history of Arrhythmia - A-fib, Coronary Artery Disease,  Hypertension, Myocardial Infarction - CABG x 4, Peripheral Arterial Disease, Peripheral Venous Disease Endocrine Patient has history of Type II Diabetes Musculoskeletal Patient has history of Osteoarthritis Neurologic Patient has history of Neuropathy Oncologic Patient has history of Received Radiation Medical A Surgical History Notes nd Cardiovascular Hyperlipidemia, CVA, AAA Oncologic Prostate cancer Objective Constitutional No acute distress.. Vitals Time Taken: 7:44 AM, Height: 70 in, Weight: 219 lbs, BMI: 31.4, Temperature: 97.6 F, Pulse: 79 bpm, Respiratory Rate: 16 breaths/min, Blood Pressure: 133/74 mmHg. Respiratory Normal work of breathing on room air.. General Notes: 07/03/2022: The wound is smaller with less slough accumulation. There is good granulation tissue emerging. Integumentary (Hair, Skin) Wound #5 status is Open. Original cause of wound was Trauma. The date acquired was: 06/05/2022. The wound has been in treatment 2 weeks. The wound is located on the Right Lower Leg. The wound measures 2cm length x 3.1cm width x 0.1cm depth; 4.869cm^2 area and 0.487cm^3 volume. There is Fat Layer (Subcutaneous Tissue) exposed. There is no tunneling or undermining noted. There is a medium amount of serosanguineous drainage noted. The wound margin is distinct with the outline attached to the wound base. There is large (67-100%) red granulation within the wound bed. There is a small (1-33%) amount of necrotic tissue within the wound bed including Adherent Slough. Assessment Active Problems ICD-10 Type 2 diabetes mellitus with other skin ulcer Peripheral vascular disease, unspecified Venous insufficiency (chronic) (peripheral) Non-pressure chronic ulcer of other part of right lower leg with fat layer exposed Procedures Wound #5 Pre-procedure diagnosis of Wound #5 is a Trauma, Other located on the Right Lower Leg . There was a Selective/Open Wound Non-Viable Tissue  Debridement with a total area of 6.2 sq cm performed by Fredirick Maudlin, MD. With the following instrument(s): Curette to remove Non-Viable tissue/material. Material removed includes [facility] after achieving pain control using Lidocaine 4% Topical Solution. No specimens were taken. A time out was conducted at 08:01, prior to the start of the procedure. A Minimum amount of bleeding was controlled with Pressure. The procedure was tolerated well with a pain level of 0 throughout and a pain level of 0 following the procedure. Post Debridement Measurements: 2cm length x 3.1cm width x 0.1cm depth; 0.487cm^3 volume. Character of Wound/Ulcer Post Debridement is improved. Post procedure Diagnosis Wound #5: Same as Pre-Procedure Plan Follow-up Appointments: Return Appointment in 1 week. - Dr Celine Ahr - Room 2 - 7/17 at 8:30 AM Bathing/ Shower/ Hygiene: May shower with protection but do not get wound dressing(s) wet. - Please do not get Left Leg wet/damp. Edema Control - Lymphedema / SCD / Other: Avoid standing for long periods of time. Exercise regularly Moisturize legs daily. WOUND #5: - Lower Leg Wound Laterality: Right Cleanser: Soap and Water 1 x Per Week/30 Days Discharge Instructions: May shower and wash wound with dial antibacterial soap and water prior to dressing change. Cleanser: Wound Cleanser 1 x Per Week/30 Days Discharge Instructions: Cleanse the wound with wound cleanser prior to applying a clean dressing using gauze sponges, not tissue or cotton balls. Peri-Wound Care: Triamcinolone 15 (g) 1 x Per Week/30 Days Discharge Instructions: Use triamcinolone 15 (g) as directed Peri-Wound Care: Sween Lotion (Moisturizing lotion) 1 x Per Week/30 Days Discharge Instructions: Apply moisturizing lotion as directed Topical: Mupirocin Ointment  1 x Per Week/30 Days Discharge Instructions: Apply Mupirocin (Bactroban) as instructed Prim Dressing: KerraCel Ag Gelling Fiber Dressing, 2x2 in (silver  alginate) 1 x Per Week/30 Days ary Discharge Instructions: Apply silver alginate to wound bed as instructed Secondary Dressing: Woven Gauze Sponge, Non-Sterile 4x4 in 1 x Per Week/30 Days Discharge Instructions: Apply over primary dressing as directed. Secured With: 64M Medipore Public affairs consultant Surgical T 2x10 (in/yd) 1 x Per Week/30 Days ape Discharge Instructions: Secure with tape as directed. Com pression Wrap: Kerlix Roll 4.5x3.1 (in/yd) 1 x Per Week/30 Days Discharge Instructions: Apply Kerlix and Coban compression as directed. Compression Wrap: Coban Self-Adherent Wrap 4x5 (in/yd) 1 x Per Week/30 Days Discharge Instructions: Apply over Kerlix as directed. 07/03/2022: The wound is smaller with less slough accumulation. There is good granulation tissue emerging. I used a curette to debride the slough from the wound surface. We will continue using the topical mupirocin and silver alginate. He will complete his course of levofloxacin. Follow-up in 1 week. Electronic Signature(s) Signed: 07/03/2022 8:26:56 AM By: Fredirick Maudlin MD FACS Entered By: Fredirick Maudlin on 07/03/2022 08:26:55 -------------------------------------------------------------------------------- HxROS Details Patient Name: Date of Service: [name], [name] 07/03/2022 7:45 A M Medical Record Number: [id number] Patient Account Number: [id number] Date of Birth/Sex: Treating RN: Jan 05, 1951 (71 y.o. [name] Primary Care Provider: [name] Other Clinician: Referring Provider: Treating Provider/Extender: Casandra Doffing in Treatment: 11 Information Obtained From Patient Eyes Medical History: Positive for: Cataracts - both eyes Cardiovascular Medical History: Positive for: Arrhythmia - A-fib; Coronary Artery Disease; Hypertension; Myocardial Infarction - CABG x 4; Peripheral Arterial Disease; Peripheral Venous Disease Past Medical History Notes: Hyperlipidemia, CVA,  AAA Endocrine Medical History: Positive for: Type II Diabetes Time with diabetes: over 5 years Treated with: Oral agents Blood sugar tested every day: No Musculoskeletal Medical History: Positive for: Osteoarthritis Neurologic Medical History: Positive for: Neuropathy Oncologic Medical History: Positive for: Received Radiation Past Medical History Notes: Prostate cancer HBO Extended History Items Eyes: Cataracts Immunizations Pneumococcal Vaccine: Received Pneumococcal Vaccination: Yes Received Pneumococcal Vaccination On or After 60th Birthday: Yes Implantable Devices None Family and Social History Current every day smoker - 1 PPD; Marital Status - Married; Alcohol Use: Rarely; Drug Use: No History; Caffeine Use: Daily - coffee, soda; Financial Concerns: No; Food, Clothing or Shelter Needs: No; Support System Lacking: No; Transportation Concerns: No Electronic Signature(s) Signed: 07/03/2022 12:30:27 PM By: Fredirick Maudlin MD FACS Signed: 07/03/2022 5:43:50 PM By: Adline Peals Entered By: Fredirick Maudlin on 07/03/2022 08:21:02 -------------------------------------------------------------------------------- SuperBill Details Patient Name: Date of Service: [name], [name] 07/03/2022 Medical Record Number: [id number] Patient Account Number: [id number] Date of Birth/Sex: Treating RN: 01-Jul-1951 (71 y.o. [name] Primary Care Provider: [name] Other Clinician: Referring Provider: Treating Provider/Extender: [name] Weeks in Treatment: 11 Diagnosis Coding ICD-10 Codes Code Description E11.622 Type 2 diabetes mellitus with other skin ulcer I73.9 Peripheral vascular disease, unspecified I87.2 Venous insufficiency (chronic) (peripheral) L97.812 Non-pressure chronic ulcer of other part of right lower leg with fat layer exposed Facility Procedures CPT4 Code: [id number] Description: [id number] - DEBRIDE WOUND 1ST 20 SQ CM  OR < ICD-10 Diagnosis Description L97.812 Non-pressure chronic ulcer of other part of right lower leg with fat layer expos Modifier: ed Quantity: 1 Physician Procedures : CPT4 Code Description Modifier [id number] - WC PHYS LEVEL 3 - EST PT 25 ICD-10 Diagnosis Description E83.151 Non-pressure chronic ulcer of other part of right lower leg with fat  layer exposed E11.622 Type 2 diabetes mellitus with other skin ulcer  I73.9 Peripheral vascular disease, unspecified I87.2 Venous insufficiency (chronic) (peripheral) Quantity: 1 : [id number] - WC PHYS DEBR WO ANESTH 20 SQ CM ICD-10 Diagnosis Description L97.812 Non-pressure chronic ulcer of other part of right lower leg with fat layer exposed Quantity: 1 Electronic Signature(s) Signed: 07/03/2022 8:28:04 AM By: Fredirick Maudlin MD FACS Entered By: Fredirick Maudlin on 07/03/2022 12:75:17

## 2022-07-10 ENCOUNTER — Encounter (HOSPITAL_BASED_OUTPATIENT_CLINIC_OR_DEPARTMENT_OTHER): Payer: PPO | Admitting: General Surgery

## 2022-07-10 DIAGNOSIS — E11622 Type 2 diabetes mellitus with other skin ulcer: Secondary | ICD-10-CM | POA: Diagnosis not present

## 2022-07-10 DIAGNOSIS — I7389 Other specified peripheral vascular diseases: Secondary | ICD-10-CM | POA: Diagnosis not present

## 2022-07-10 DIAGNOSIS — L97812 Non-pressure chronic ulcer of other part of right lower leg with fat layer exposed: Secondary | ICD-10-CM | POA: Diagnosis not present

## 2022-07-10 DIAGNOSIS — I872 Venous insufficiency (chronic) (peripheral): Secondary | ICD-10-CM | POA: Diagnosis not present

## 2022-07-11 NOTE — Progress Notes (Signed)
[name] ([id number]) . Visit Report for 07/10/2022 Chief Complaint Document Details Patient Name: Date of Service: [name]. 07/10/2022 8:30 A M Medical Record Number: [id number] Patient Account Number: [id number] Date of Birth/Sex: Treating RN: 16-Mar-1951 (71 y.o. [name] Primary Care Provider: [name] Other Clinician: Referring Provider: Treating Provider/Extender: Casandra Doffing in Treatment: 12 Information Obtained from: Patient Chief Complaint Patient seen for complaints of Non-Healing Wounds. Electronic Signature(s) Signed: 07/10/2022 8:56:29 AM By: Fredirick Maudlin MD FACS Entered By: Fredirick Maudlin on 07/10/2022 08:56:29 -------------------------------------------------------------------------------- Debridement Details Patient Name: Date of Service: [name], Marcus [name] 07/10/2022 8:30 A M Medical Record Number: [id number] Patient Account Number: [id number] Date of Birth/Sex: Treating RN: September 04, 1951 (71 y.o. Mare Ferrari Primary Care Provider: [name] Other Clinician: Referring Provider: Treating Provider/Extender: Casandra Doffing in Treatment: 12 Debridement Performed for Assessment: Wound #5 Right Lower Leg Performed By: Physician Fredirick Maudlin, MD Debridement Type: Debridement Level of Consciousness (Pre-procedure): Awake and Alert Pre-procedure Verification/Time Out Yes - 08:50 Taken: Start Time: 08:54 Pain Control: Lidocaine 5% topical ointment T Area Debrided (L x W): otal 2 (cm) x 2.4 (cm) = 4.8 (cm) Tissue and other material debrided: Non-Viable, Slough, Slough Level: Non-Viable Tissue Debridement Description: Selective/Open Wound Instrument: Curette Bleeding: Minimum Hemostasis Achieved: Pressure Procedural Pain: 0 Post Procedural Pain: 0 Response to Treatment: Procedure was tolerated well Level of Consciousness (Post- Awake and  Alert procedure): Post Debridement Measurements of Total Wound Length: (cm) 2 Width: (cm) 2.4 Depth: (cm) 0.1 Volume: (cm) 0.377 Character of Wound/Ulcer Post Debridement: Improved Post Procedure Diagnosis Same as Pre-procedure Electronic Signature(s) Signed: 07/10/2022 9:51:33 AM By: Fredirick Maudlin MD FACS Signed: 07/10/2022 5:26:27 PM By: Sharyn Creamer RN, BSN Entered By: Sharyn Creamer on 07/10/2022 08:55:14 -------------------------------------------------------------------------------- HPI Details Patient Name: Date of Service: [name], Marcus [name] 07/10/2022 8:30 A M Medical Record Number: [id number] Patient Account Number: [id number] Date of Birth/Sex: Treating RN: 1951-05-31 (71 y.o. [name] Primary Care Provider: [name] Other Clinician: Referring Provider: Treating Provider/Extender: Casandra Doffing in Treatment: 12 History of Present Illness HPI Description: 11/10/2020 patient presents today for initial evaluation in this clinic although I have seen this patient in [place] previous. Subsequently his issue today is different from when I saw him for I was treating him for a toe ulcer. Currently he is having issues with bilateral lower extremity ulcers after running into a metal bar. Upon inspection today he has wounds of the bilateral lower extremities which are consistent with having struck his legs on the anterior aspect. With that being said the patient did go to the ER for evaluation on October 11 where he was given Keflex initially. He went back to the ER after not improving on October 20 he was given Lasix at that point he states the Lasix seem to help the most. Has been using Neosporin and leaving this open area which is probably not the best way to go either to be honest. He has had arterial studies on March 2021 which showed a left ABI of 0.78 with a TBI of 0.34 and a right ABI of 1.18 with a TBI 1.18. Nonetheless his  arterial flow the left is not ideal but also I think potentially consistent with allowing this to heal. I think he can also support light compression with Kerlix and Coban based on this result. His most recent hemoglobin A1c that we could find was  7.3 and that was towards the end of 2020. The patient does have a history of heart disease unfortunately. He is also currently still a smoker. 11/24/2020 on evaluation today patient appears to be doing well with regard to his wounds. In fact both appear to be doing better after last week's rather last visit's debridement. Fortunately there is no signs of active infection at this time. No fevers, chills, nausea, vomiting, or diarrhea. Overall I am very pleased with where things stand today. 12/01/2020 on evaluation today patient appears to be doing well with regard to his leg ulcers. He has been tolerating the dressing changes without complication. Fortunately there is no signs of active infection at this time. No fevers, chills, nausea, vomiting, or diarrhea. 12/08/2020 on evaluation today patient appears to be doing very well in regard to his bilateral lower extremity ulcers. They seem to be doing excellent and he is making great progress. There does not appear to be any evidence of active infection at this time. No fevers, chills, nausea, vomiting, or diarrhea. 12/15/2020 upon evaluation today patient actually appears to be doing excellent in regard to his wounds. Has been tolerating the dressing changes without complication. Fortunately his wounds are showing signs of great improvement were using Hydrofera Blue. 12/29/2020 on evaluation today patient appears to be doing well with regard to his right leg which is completely healed. His left leg is also measuring smaller than not healed this is doing very well. Fortunately there is no signs of active infection at this time 01/05/2021 upon evaluation today patient actually appears to be making signs of improvement  with regard to his left leg. I am very pleased with how things are progressing. There is no evidence of active infection at this time. No fevers, chills, nausea, vomiting, or diarrhea. Patient is extremely happy with the fact that this is doing so well 01/12/2021 upon evaluation today patient appears to be doing excellent in regard to his leg ulcer. Fortunately there is no signs of active infection at this time. No fevers, chills, nausea, vomiting, or diarrhea. He has been tolerating the dressing changes without complication. 01/19/2021 upon evaluation today patient appears to be doing well with regard to his wound. In fact this appears to be completely healed on the left leg. This is brand-new skin but overall seems to be doing quite well which is great news. No fevers, chills, nausea, vomiting, or diarrhea. READMISSION 04/17/2022 This is a patient has been seen on a number of occasions in the wound care center. He is 71 years old and has type 2 diabetes mellitus, coronary artery disease, chronic venous insufficiency, and peripheral arterial disease with recent vascular studies demonstrating a right TBI of 0.9 (normal) and a left great toe TBI of 0.33. 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 He recently dropped an object and it hit him in the left shin. He then bumped the same leg against his wagon. He now has 2 wounds on his left anterior lower leg. He says that he applied Aquaphor to keep the areas moist and made an appointment in the wound care center for follow-up. 04/24/2022: Today, he is here with what he describes as 10 out of 10 pain in his wounds. Both of them measured a little bit larger today, but this may be secondary to the debridement that was performed last week. He says that due to it being spring season, he is on his feet quite a bit more. The leg is red and  warm. We have been using silver alginate with Kerlix and Coban wraps. 05/01/2022: PCR culture taken last week grew  out MRSA. I prescribed doxycycline. He has been taking this. Both wounds are a bit smaller today and somewhat less painful. They continue to accumulate a bit of slough. 05/08/2022: He has completed his course of oral doxycycline. We have been using mupirocin under silver alginate with Kerlix and Coban wrapping. Today, both wounds are a bit smaller but have accumulated slough on the surface. They are less tender. 05/23/2022: Both wounds are quite a bit more shallow today. There is a little bit of slough accumulation, but there is also good granulation tissue as well as some buds of epithelialized tissue coming through the wound. No surrounding erythema, induration, nor any purulent drainage. We have been using mupirocin with silver alginate and Kerlix/Coban wrapping. 05/29/2022: The more anterior wound continues to demonstrate encroaching epithelium and the remaining open portion has good granulation tissue. The more lateral wound is nearly closed with just a pinpoint opening. No concern for infection. 06/05/2022: The lateral wound has healed. The more anterior wound is much smaller today with just a little bit of surrounding eschar. The wound surface is healthy-appearing with good granulation tissue. 06/13/2022: The wound on the anterior tibial surface on the left is nearly closed. There is just a tiny opening underneath some eschar. Unfortunately, his dog jumped up on him and cut his right lower leg. There is a flap of nonviable skin with some hematoma and bruising. No surrounding erythema or induration. No purulent discharge. 06/19/2022: The wound on his left leg is closed. The right sided wound is smaller, but there is significant periwound erythema and the leg is more tender. There is slough accumulation on the wound surface. 06/26/2022: The right sided wound is about the same. It is tender but there is less periwound erythema. He still has slough on the wound surface. He has been taking Bactrim as  prescribed. 07/03/2022: The PCR culture that I took demonstrated bacteria not sensitive to Bactrim so he was prescribed levofloxacin, as supported by the culture data. He has been taking that and there has been significant improvement in his degree of pain. The wound itself is smaller with less slough accumulation. There is good granulation tissue emerging. 07/10/2022: The wound has contracted substantially and there is a lot of epithelialized tissue budding through. There is a small amount of slough on the surface. No concern for infection. Electronic Signature(s) Signed: 07/10/2022 8:57:07 AM By: Fredirick Maudlin MD FACS Entered By: Fredirick Maudlin on 07/10/2022 08:57:06 -------------------------------------------------------------------------------- Physical Exam Details Patient Name: Date of Service: [name], Marcus [name] 07/10/2022 8:30 A M Medical Record Number: [id number] Patient Account Number: [id number] Date of Birth/Sex: Treating RN: [birth year] (71 y.o. [name] Primary Care Provider: [name] Other Clinician: Referring Provider: Treating Provider/Extender: [name] Weeks in Treatment: 12 Constitutional . . . . No acute distress.Marland Kitchen Respiratory Normal work of breathing on room air.. Notes 07/10/2022: The wound has contracted substantially and there is a lot of epithelialized tissue budding through. There is a small amount of slough on the surface. No concern for infection. Electronic Signature(s) Signed: 07/10/2022 8:58:09 AM By: Fredirick Maudlin MD FACS Entered By: Fredirick Maudlin on 07/10/2022 08:58:09 -------------------------------------------------------------------------------- Physician Orders Details Patient Name: Date of Service: [name], Marcus [name] 07/10/2022 8:30 A M Medical Record Number: [id number] Patient Account Number: [id number] Date of Birth/Sex: Treating RN: Aug 17, 1951 (71 y.o. Mare Ferrari Primary  Care  Provider: [name] Other Clinician: Referring Provider: Treating Provider/Extender: Casandra Doffing in Treatment: 12 Verbal / Phone Orders: No Diagnosis Coding Follow-up Appointments ppointment in 1 week. - Dr Celine Ahr - Room 2 7/24 '@7'$ :45am Return A Bathing/ Shower/ Hygiene May shower with protection but do not get wound dressing(s) wet. - Please do not get Left Leg wet/damp. Edema Control - Lymphedema / SCD / Other Bilateral Lower Extremities Avoid standing for long periods of time. Exercise regularly Moisturize legs daily. Wound Treatment Wound #5 - Lower Leg Wound Laterality: Right Cleanser: Soap and Water 1 x Per Week/30 Days Discharge Instructions: May shower and wash wound with dial antibacterial soap and water prior to dressing change. Cleanser: Wound Cleanser 1 x Per Week/30 Days Discharge Instructions: Cleanse the wound with wound cleanser prior to applying a clean dressing using gauze sponges, not tissue or cotton balls. Peri-Wound Care: Triamcinolone 15 (g) 1 x Per Week/30 Days Discharge Instructions: Use triamcinolone 15 (g) as directed Peri-Wound Care: Sween Lotion (Moisturizing lotion) 1 x Per Week/30 Days Discharge Instructions: Apply moisturizing lotion as directed Topical: Mupirocin Ointment 1 x Per Week/30 Days Discharge Instructions: Apply Mupirocin (Bactroban) as instructed Prim Dressing: KerraCel Ag Gelling Fiber Dressing, 2x2 in (silver alginate) 1 x Per Week/30 Days ary Discharge Instructions: Apply silver alginate to wound bed as instructed Secondary Dressing: Woven Gauze Sponge, Non-Sterile 4x4 in 1 x Per Week/30 Days Discharge Instructions: Apply over primary dressing as directed. Secured With: 22M Medipore Public affairs consultant Surgical T 2x10 (in/yd) 1 x Per Week/30 Days ape Discharge Instructions: Secure with tape as directed. Compression Wrap: Kerlix Roll 4.5x3.1 (in/yd) 1 x Per Week/30 Days Discharge Instructions: Apply Kerlix and  Coban compression as directed. Compression Wrap: Coban Self-Adherent Wrap 4x5 (in/yd) 1 x Per Week/30 Days Discharge Instructions: Apply over Kerlix as directed. Electronic Signature(s) Signed: 07/10/2022 9:51:33 AM By: Fredirick Maudlin MD FACS Signed: 07/10/2022 5:26:27 PM By: Sharyn Creamer RN, BSN Previous Signature: 07/10/2022 8:58:16 AM Version By: Fredirick Maudlin MD FACS Entered By: Sharyn Creamer on 07/10/2022 09:08:47 -------------------------------------------------------------------------------- Problem List Details Patient Name: Date of Service: [name], Marcus [name] 07/10/2022 8:30 A M Medical Record Number: [id number] Patient Account Number: [id number] Date of Birth/Sex: Treating RN: 1951-11-06 (71 y.o. [name] Primary Care Provider: [name] Other Clinician: Referring Provider: Treating Provider/Extender: [name] Weeks in Treatment: 12 Active Problems ICD-10 Encounter Code Description Active Date MDM Diagnosis E11.622 Type 2 diabetes mellitus with other skin ulcer 04/17/2022 No Yes I73.9 Peripheral vascular disease, unspecified 04/17/2022 No Yes I87.2 Venous insufficiency (chronic) (peripheral) 04/17/2022 No Yes L97.812 Non-pressure chronic ulcer of other part of right lower leg with fat layer 06/13/2022 No Yes exposed Inactive Problems Resolved Problems ICD-10 Code Description Active Date Resolved Date L97.822 Non-pressure chronic ulcer of other part of left lower leg with fat layer exposed 04/17/2022 04/17/2022 Electronic Signature(s) Signed: 07/10/2022 8:56:17 AM By: Fredirick Maudlin MD FACS Entered By: Fredirick Maudlin on 07/10/2022 08:56:17 -------------------------------------------------------------------------------- Progress Note Details Patient Name: Date of Service: [name], Marcus [name] 07/10/2022 8:30 A M Medical Record Number: [id number] Patient Account Number: [id number] Date of Birth/Sex: Treating RN: 1951/04/09  (71 y.o. [name] Primary Care Provider: [name] Other Clinician: Referring Provider: Treating Provider/Extender: Casandra Doffing in Treatment: 12 Subjective Chief Complaint Information obtained from Patient Patient seen for complaints of Non-Healing Wounds. History of Present Illness (HPI) 11/10/2020 patient presents today for initial evaluation in this clinic although  I have seen this patient in [place] previous. Subsequently his issue today is different from when I saw him for I was treating him for a toe ulcer. Currently he is having issues with bilateral lower extremity ulcers after running into a metal bar. Upon inspection today he has wounds of the bilateral lower extremities which are consistent with having struck his legs on the anterior aspect. With that being said the patient did go to the ER for evaluation on October 11 where he was given Keflex initially. He went back to the ER after not improving on October 20 he was given Lasix at that point he states the Lasix seem to help the most. Has been using Neosporin and leaving this open area which is probably not the best way to go either to be honest. He has had arterial studies on March 2021 which showed a left ABI of 0.78 with a TBI of 0.34 and a right ABI of 1.18 with a TBI 1.18. Nonetheless his arterial flow the left is not ideal but also I think potentially consistent with allowing this to heal. I think he can also support light compression with Kerlix and Coban based on this result. His most recent hemoglobin A1c that we could find was 7.3 and that was towards the end of 2020. The patient does have a history of heart disease unfortunately. He is also currently still a smoker. 11/24/2020 on evaluation today patient appears to be doing well with regard to his wounds. In fact both appear to be doing better after last week's rather last visit's debridement. Fortunately there is no signs of  active infection at this time. No fevers, chills, nausea, vomiting, or diarrhea. Overall I am very pleased with where things stand today. 12/01/2020 on evaluation today patient appears to be doing well with regard to his leg ulcers. He has been tolerating the dressing changes without complication. Fortunately there is no signs of active infection at this time. No fevers, chills, nausea, vomiting, or diarrhea. 12/08/2020 on evaluation today patient appears to be doing very well in regard to his bilateral lower extremity ulcers. They seem to be doing excellent and he is making great progress. There does not appear to be any evidence of active infection at this time. No fevers, chills, nausea, vomiting, or diarrhea. 12/15/2020 upon evaluation today patient actually appears to be doing excellent in regard to his wounds. Has been tolerating the dressing changes without complication. Fortunately his wounds are showing signs of great improvement were using Hydrofera Blue. 12/29/2020 on evaluation today patient appears to be doing well with regard to his right leg which is completely healed. His left leg is also measuring smaller than not healed this is doing very well. Fortunately there is no signs of active infection at this time 01/05/2021 upon evaluation today patient actually appears to be making signs of improvement with regard to his left leg. I am very pleased with how things are progressing. There is no evidence of active infection at this time. No fevers, chills, nausea, vomiting, or diarrhea. Patient is extremely happy with the fact that this is doing so well 01/12/2021 upon evaluation today patient appears to be doing excellent in regard to his leg ulcer. Fortunately there is no signs of active infection at this time. No fevers, chills, nausea, vomiting, or diarrhea. He has been tolerating the dressing changes without complication. 01/19/2021 upon evaluation today patient appears to be doing well with  regard to his wound. In fact this appears to  be completely healed on the left leg. This is brand-new skin but overall seems to be doing quite well which is great news. No fevers, chills, nausea, vomiting, or diarrhea. READMISSION 04/17/2022 This is a patient has been seen on a number of occasions in the wound care center. He is 71 years old and has type 2 diabetes mellitus, coronary artery disease, chronic venous insufficiency, and peripheral arterial disease with recent vascular studies demonstrating a right TBI of 0.9 (normal) and a left great toe TBI of 0.33. 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 He recently dropped an object and it hit him in the left shin. He then bumped the same leg against his wagon. He now has 2 wounds on his left anterior lower leg. He says that he applied Aquaphor to keep the areas moist and made an appointment in the wound care center for follow-up. 04/24/2022: Today, he is here with what he describes as 10 out of 10 pain in his wounds. Both of them measured a little bit larger today, but this may be secondary to the debridement that was performed last week. He says that due to it being spring season, he is on his feet quite a bit more. The leg is red and warm. We have been using silver alginate with Kerlix and Coban wraps. 05/01/2022: PCR culture taken last week grew out MRSA. I prescribed doxycycline. He has been taking this. Both wounds are a bit smaller today and somewhat less painful. They continue to accumulate a bit of slough. 05/08/2022: He has completed his course of oral doxycycline. We have been using mupirocin under silver alginate with Kerlix and Coban wrapping. Today, both wounds are a bit smaller but have accumulated slough on the surface. They are less tender. 05/23/2022: Both wounds are quite a bit more shallow today. There is a little bit of slough accumulation, but there is also good granulation tissue as well as some buds of epithelialized  tissue coming through the wound. No surrounding erythema, induration, nor any purulent drainage. We have been using mupirocin with silver alginate and Kerlix/Coban wrapping. 05/29/2022: The more anterior wound continues to demonstrate encroaching epithelium and the remaining open portion has good granulation tissue. The more lateral wound is nearly closed with just a pinpoint opening. No concern for infection. 06/05/2022: The lateral wound has healed. The more anterior wound is much smaller today with just a little bit of surrounding eschar. The wound surface is healthy-appearing with good granulation tissue. 06/13/2022: The wound on the anterior tibial surface on the left is nearly closed. There is just a tiny opening underneath some eschar. Unfortunately, his dog jumped up on him and cut his right lower leg. There is a flap of nonviable skin with some hematoma and bruising. No surrounding erythema or induration. No purulent discharge. 06/19/2022: The wound on his left leg is closed. The right sided wound is smaller, but there is significant periwound erythema and the leg is more tender. There is slough accumulation on the wound surface. 06/26/2022: The right sided wound is about the same. It is tender but there is less periwound erythema. He still has slough on the wound surface. He has been taking Bactrim as prescribed. 07/03/2022: The PCR culture that I took demonstrated bacteria not sensitive to Bactrim so he was prescribed levofloxacin, as supported by the culture data. He has been taking that and there has been significant improvement in his degree of pain. The wound itself is smaller with less slough accumulation.  There is good granulation tissue emerging. 07/10/2022: The wound has contracted substantially and there is a lot of epithelialized tissue budding through. There is a small amount of slough on the surface. No concern for infection. Patient History Information obtained from Patient. Social  History Current every day smoker - 1 PPD, Marital Status - Married, Alcohol Use - Rarely, Drug Use - No History, Caffeine Use - Daily - coffee, soda. Medical History Eyes Patient has history of Cataracts - both eyes Cardiovascular Patient has history of Arrhythmia - A-fib, Coronary Artery Disease, Hypertension, Myocardial Infarction - CABG x 4, Peripheral Arterial Disease, Peripheral Venous Disease Endocrine Patient has history of Type II Diabetes Musculoskeletal Patient has history of Osteoarthritis Neurologic Patient has history of Neuropathy Oncologic Patient has history of Received Radiation Medical A Surgical History Notes nd Cardiovascular Hyperlipidemia, CVA, AAA Oncologic Prostate cancer Objective Constitutional No acute distress.. Vitals Time Taken: 8:37 AM, Height: 70 in, Weight: 219 lbs, BMI: 31.4, Temperature: 97.8 F, Pulse: 62 bpm, Respiratory Rate: 18 breaths/min, Blood Pressure: 116/70 mmHg. Respiratory Normal work of breathing on room air.. General Notes: 07/10/2022: The wound has contracted substantially and there is a lot of epithelialized tissue budding through. There is a small amount of slough on the surface. No concern for infection. Integumentary (Hair, Skin) Wound #5 status is Open. Original cause of wound was Trauma. The date acquired was: 06/05/2022. The wound has been in treatment 3 weeks. The wound is located on the Right Lower Leg. The wound measures 2cm length x 2.4cm width x 0.1cm depth; 3.77cm^2 area and 0.377cm^3 volume. There is Fat Layer (Subcutaneous Tissue) exposed. There is no tunneling or undermining noted. There is a medium amount of serosanguineous drainage noted. The wound margin is distinct with the outline attached to the wound base. There is medium (34-66%) red, hyper - granulation within the wound bed. There is a medium (34-66%) amount of necrotic tissue within the wound bed including Adherent Slough. Assessment Active  Problems ICD-10 Type 2 diabetes mellitus with other skin ulcer Peripheral vascular disease, unspecified Venous insufficiency (chronic) (peripheral) Non-pressure chronic ulcer of other part of right lower leg with fat layer exposed Procedures Wound #5 Pre-procedure diagnosis of Wound #5 is a Trauma, Other located on the Right Lower Leg . There was a Selective/Open Wound Non-Viable Tissue Debridement with a total area of 4.8 sq cm performed by Fredirick Maudlin, MD. With the following instrument(s): Curette to remove Non-Viable tissue/material. Material removed includes [facility] after achieving pain control using Lidocaine 5% topical ointment. No specimens were taken. A time out was conducted at 08:50, prior to the start of the procedure. A Minimum amount of bleeding was controlled with Pressure. The procedure was tolerated well with a pain level of 0 throughout and a pain level of 0 following the procedure. Post Debridement Measurements: 2cm length x 2.4cm width x 0.1cm depth; 0.377cm^3 volume. Character of Wound/Ulcer Post Debridement is improved. Post procedure Diagnosis Wound #5: Same as Pre-Procedure Plan 07/10/2022: The wound has contracted substantially and there is a lot of epithelialized tissue budding through. There is a small amount of slough on the surface. No concern for infection. I used a curette to debride the slough from the wound surface. We will continue using silver alginate and 3 layer compression. Follow-up in 1 week. Electronic Signature(s) Signed: 07/10/2022 8:58:47 AM By: Fredirick Maudlin MD FACS Entered By: Fredirick Maudlin on 07/10/2022 08:58:47 -------------------------------------------------------------------------------- HxROS Details Patient Name: Date of Service: [name], [name] 07/10/2022 8:30 A  M Medical Record Number: [id number] Patient Account Number: [id number] Date of Birth/Sex: Treating RN: July 30, 1951 (71 y.o. [name] Primary Care  Provider: [name] Other Clinician: Referring Provider: Treating Provider/Extender: Casandra Doffing in Treatment: 12 Information Obtained From Patient Eyes Medical History: Positive for: Cataracts - both eyes Cardiovascular Medical History: Positive for: Arrhythmia - A-fib; Coronary Artery Disease; Hypertension; Myocardial Infarction - CABG x 4; Peripheral Arterial Disease; Peripheral Venous Disease Past Medical History Notes: Hyperlipidemia, CVA, AAA Endocrine Medical History: Positive for: Type II Diabetes Time with diabetes: over 5 years Treated with: Oral agents Blood sugar tested every day: No Musculoskeletal Medical History: Positive for: Osteoarthritis Neurologic Medical History: Positive for: Neuropathy Oncologic Medical History: Positive for: Received Radiation Past Medical History Notes: Prostate cancer HBO Extended History Items Eyes: Cataracts Immunizations Pneumococcal Vaccine: Received Pneumococcal Vaccination: Yes Received Pneumococcal Vaccination On or After 60th Birthday: Yes Implantable Devices None Family and Social History Current every day smoker - 1 PPD; Marital Status - Married; Alcohol Use: Rarely; Drug Use: No History; Caffeine Use: Daily - coffee, soda; Financial Concerns: No; Food, Clothing or Shelter Needs: No; Support System Lacking: No; Transportation Concerns: No Electronic Signature(s) Signed: 07/10/2022 9:51:33 AM By: Fredirick Maudlin MD FACS Signed: 07/11/2022 5:25:46 PM By: Adline Peals Entered By: Fredirick Maudlin on 07/10/2022 08:57:12 -------------------------------------------------------------------------------- SuperBill Details Patient Name: Date of Service: [name], Marcus [name] 07/10/2022 Medical Record Number: [id number] Patient Account Number: [id number] Date of Birth/Sex: Treating RN: 27-Jun-1951 (71 y.o. [name] Primary Care Provider: [name] Other  Clinician: Referring Provider: Treating Provider/Extender: [name] Weeks in Treatment: 12 Diagnosis Coding ICD-10 Codes Code Description E11.622 Type 2 diabetes mellitus with other skin ulcer I73.9 Peripheral vascular disease, unspecified I87.2 Venous insufficiency (chronic) (peripheral) L97.812 Non-pressure chronic ulcer of other part of right lower leg with fat layer exposed Facility Procedures CPT4 Code: [id number] Description: [phone] - DEBRIDE WOUND 1ST 20 SQ CM OR < ICD-10 Diagnosis Description L97.812 Non-pressure chronic ulcer of other part of right lower leg with fat layer expos Modifier: ed Quantity: 1 Physician Procedures : CPT4 Code Description Modifier [id number] - WC PHYS LEVEL 3 - EST PT 25 ICD-10 Diagnosis Description H57.473 Non-pressure chronic ulcer of other part of right lower leg with fat layer exposed E11.622 Type 2 diabetes mellitus with other skin ulcer  I73.9 Peripheral vascular disease, unspecified I87.2 Venous insufficiency (chronic) (peripheral) Quantity: 1 : [id number] - WC PHYS DEBR WO ANESTH 20 SQ CM ICD-10 Diagnosis Description F84.037 Non-pressure chronic ulcer of other part of right lower leg with fat layer exposed Quantity: 1 Electronic Signature(s) Signed: 07/10/2022 8:59:11 AM By: Fredirick Maudlin MD FACS Entered By: Fredirick Maudlin on 07/10/2022 08:59:10

## 2022-07-11 NOTE — Progress Notes (Signed)
SANTOSH, PETTER ([id number]) . Visit Report for 07/10/2022 Arrival Information Details Patient Name: Date of Service: [name]. 07/10/2022 8:30 A M Medical Record Number: [id number] Patient Account Number: [id number] Date of Birth/Sex: Treating RN: June 17, 1951 (71 y.o. [name] Primary Care Nga Rabon: [name] Other Clinician: Referring Nanna Ertle: Treating Shawndell Schillaci/Extender: Casandra Doffing in Treatment: 12 Visit Information History Since Last Visit Added or deleted any medications: No Patient Arrived: Ambulatory Any new allergies or adverse reactions: No Arrival Time: 08:36 Had a fall or experienced change in No Accompanied By: self activities of daily living that may affect Transfer Assistance: None risk of falls: Patient Identification Verified: Yes Signs or symptoms of abuse/neglect since last visito No Patient Requires Transmission-Based Precautions: No Hospitalized since last visit: No Patient Has Alerts: Yes Implantable device outside of the clinic excluding No Patient Alerts: Patient on Blood Thinner cellular tissue based products placed in the center ABI R 1.11 since last visit: ABI L 0.72 Has Dressing in Place as Prescribed: Yes TBI L 0.33 Has Compression in Place as Prescribed: Yes Pain Present Now: Yes Electronic Signature(s) Signed: 07/10/2022 10:09:05 AM By: Dellie Catholic RN Entered By: Dellie Catholic on 07/10/2022 08:36:54 -------------------------------------------------------------------------------- Encounter Discharge Information Details Patient Name: Date of Service: [name], Marcus [name] 07/10/2022 8:30 A M Medical Record Number: [id number] Patient Account Number: [id number] Date of Birth/Sex: Treating RN: Mar 09, 1951 (71 y.o. Mare Ferrari Primary Care Laelah Siravo: [name] Other Clinician: Referring Kennisha Qin: Treating Masyn Rostro/Extender: Casandra Doffing in Treatment:  12 Encounter Discharge Information Items Post Procedure Vitals Discharge Condition: Stable Temperature (F): 97.8 Ambulatory Status: Ambulatory Pulse (bpm): 62 Discharge Destination: Home Respiratory Rate (breaths/min): 18 Transportation: Private Auto Blood Pressure (mmHg): 116/70 Accompanied By: self Schedule Follow-up Appointment: Yes Clinical Summary of Care: Patient Declined Electronic Signature(s) Signed: 07/10/2022 5:26:27 PM By: Sharyn Creamer RN, BSN Entered By: Sharyn Creamer on 07/10/2022 10:54:45 -------------------------------------------------------------------------------- Lower Extremity Assessment Details Patient Name: Date of Service: [name], Marcus [name] 07/10/2022 8:30 A M Medical Record Number: [id number] Patient Account Number: [id number] Date of Birth/Sex: Treating RN: 1951-09-09 (71 y.o. Mare Ferrari Primary Care Airel Magadan: [name] Other Clinician: Referring Carol Loftin: Treating Kiylee Thoreson/Extender: [name] Weeks in Treatment: 12 Edema Assessment Assessed: [Left: No] [Right: No] Edema: [Left: Ye] [Right: s] Calf Left: Right: Point of Measurement: 34 cm From Medial Instep 36.1 cm 39.5 cm Ankle Left: Right: Point of Measurement: 12 cm From Medial Instep 25.5 cm 25 cm Vascular Assessment Pulses: Dorsalis Pedis Palpable: [Right:Yes] Electronic Signature(s) Signed: 07/10/2022 5:26:27 PM By: Sharyn Creamer RN, BSN Entered By: Sharyn Creamer on 07/10/2022 08:47:33 -------------------------------------------------------------------------------- Multi Wound Chart Details Patient Name: Date of Service: [name], Marcus [name] 07/10/2022 8:30 A M Medical Record Number: [id number] Patient Account Number: [id number] Date of Birth/Sex: Treating RN: 05/10/1951 (71 y.o. [name] Primary Care Daryus Sowash: [name] Other Clinician: Referring Galileo Colello: Treating Britt Petroni/Extender: [name] Weeks in Treatment: 12 Vital Signs Height(in): 70 Pulse(bpm): 60 Weight(lbs): 219 Blood Pressure(mmHg): 116/70 Body Mass Index(BMI): 31.4 Temperature(F): 97.8 Respiratory Rate(breaths/min): 18 Photos: [N/A:N/A] Right Lower Leg N/A N/A Wound Location: Trauma N/A N/A Wounding Event: Trauma, Other N/A N/A Primary Etiology: Cataracts, Arrhythmia, Coronary N/A N/A Comorbid History: Artery Disease, Hypertension, Myocardial Infarction, Peripheral Arterial Disease, Peripheral Venous Disease, Type II Diabetes, Osteoarthritis, Neuropathy, Received Radiation 06/05/2022 N/A N/A Date Acquired: 3 N/A N/A Weeks of Treatment: Open N/A N/A Wound Status: No N/A  N/A Wound Recurrence: 2x2.4x0.1 N/A N/A Measurements L x W x D (cm) 3.77 N/A N/A A (cm) : rea 0.377 N/A N/A Volume (cm) : 43.50% N/A N/A % Reduction in A rea: 43.60% N/A N/A % Reduction in Volume: Full Thickness Without Exposed N/A N/A Classification: Support Structures Medium N/A N/A Exudate A mount: Serosanguineous N/A N/A Exudate Type: red, brown N/A N/A Exudate Color: Distinct, outline attached N/A N/A Wound Margin: Medium (34-66%) N/A N/A Granulation A mount: Red, Hyper-granulation N/A N/A Granulation Quality: Medium (34-66%) N/A N/A Necrotic A mount: Fat Layer (Subcutaneous Tissue): Yes N/A N/A Exposed Structures: Fascia: No Tendon: No Muscle: No Joint: No Bone: No Small (1-33%) N/A N/A Epithelialization: Debridement - Selective/Open Wound N/A N/A Debridement: Pre-procedure Verification/Time Out 08:50 N/A N/A Taken: Lidocaine 5% topical ointment N/A N/A Pain Control: Slough N/A N/A Tissue Debrided: Non-Viable Tissue N/A N/A Level: 4.8 N/A N/A Debridement A (sq cm): rea Curette N/A N/A Instrument: Minimum N/A N/A Bleeding: Pressure N/A N/A Hemostasis A chieved: 0 N/A N/A Procedural Pain: 0 N/A N/A Post Procedural Pain: Procedure was tolerated well N/A N/A Debridement  Treatment Response: 2x2.4x0.1 N/A N/A Post Debridement Measurements L x W x D (cm) 0.377 N/A N/A Post Debridement Volume: (cm) Debridement N/A N/A Procedures Performed: Treatment Notes Electronic Signature(s) Signed: 07/10/2022 8:56:23 AM By: Fredirick Maudlin MD FACS Signed: 07/11/2022 5:25:46 PM By: Adline Peals Entered By: Fredirick Maudlin on 07/10/2022 08:56:23 -------------------------------------------------------------------------------- Multi-Disciplinary Care Plan Details Patient Name: Date of Service: [name], Marcus [name] 07/10/2022 8:30 A M Medical Record Number: [id number] Patient Account Number: [id number] Date of Birth/Sex: Treating RN: Oct 08, 1951 (71 y.o. Mare Ferrari Primary Care Jerlisa Diliberto: [name] Other Clinician: Referring Ahley Bulls: Treating Iyana Topor/Extender: [name] Weeks in Treatment: 12 Active Inactive Wound/Skin Impairment Nursing Diagnoses: Impaired tissue integrity Goals: Patient/caregiver will verbalize understanding of skin care regimen Date Initiated: 04/17/2022 Target Resolution Date: 07/21/2022 Goal Status: Active Interventions: Assess ulceration(s) every visit Treatment Activities: Skin care regimen initiated : 04/17/2022 Notes: Electronic Signature(s) Signed: 07/10/2022 5:26:27 PM By: Sharyn Creamer RN, BSN Entered By: Sharyn Creamer on 07/10/2022 08:50:26 -------------------------------------------------------------------------------- Pain Assessment Details Patient Name: Date of Service: [name], Marcus [name] 07/10/2022 8:30 A M Medical Record Number: [id number] Patient Account Number: [id number] Date of Birth/Sex: Treating RN: 02-Aug-1951 (71 y.o. [name] Primary Care Ardian Haberland: [name] Other Clinician: Referring Giovannie Scerbo: Treating Masiyah Jorstad/Extender: Casandra Doffing in Treatment: 12 Active Problems Location of Pain Severity and Description of  Pain Patient Has Paino Yes Site Locations Pain Location: Pain in Ulcers With Dressing Change: No Duration of the Pain. Constant / Intermittento Constant Rate the pain. Current Pain Level: 2 Worst Pain Level: 7 Least Pain Level: 2 Tolerable Pain Level: 2 Character of Pain Describe the Pain: Difficult to Pinpoint Pain Management and Medication Current Pain Management: Medication: Yes Cold Application: No Rest: Yes Massage: No Activity: No SandovalE.N.S.: No Heat Application: No Leg drop or elevation: No Is the Current Pain Management Adequate: Adequate How does your wound impact your activities of daily livingo Sleep: No Bathing: No Appetite: No Relationship With Others: No Bladder Continence: No Emotions: No Bowel Continence: No Work: No Toileting: No Drive: No Dressing: No Hobbies: No Electronic Signature(s) Signed: 07/10/2022 10:09:05 AM By: Dellie Catholic RN Entered By: Dellie Catholic on 07/10/2022 08:40:18 -------------------------------------------------------------------------------- Patient/Caregiver Education Details Patient Name: Date of Service: Marcus Murlean Hark 7/17/2023andnbsp8:30 A M Medical Record Number: [id number] Patient Account Number: [id number] Date of Birth/Gender:  Treating RN: [birth year] (71 y.o. Mare Ferrari Primary Care Physician: [name] Other Clinician: Referring Physician: Treating Physician/Extender: Casandra Doffing in Treatment: 12 Education Assessment Education Provided To: Patient Education Topics Provided Wound/Skin Impairment: Methods: Explain/Verbal Responses: State content correctly [employer]) Signed: 07/10/2022 5:26:27 PM By: Sharyn Creamer RN, BSN Entered By: Sharyn Creamer on 07/10/2022 08:50:45 -------------------------------------------------------------------------------- Wound Assessment Details Patient Name: Date of Service: [name], Marcus [name] 07/10/2022 8:30  A M Medical Record Number: [id number] Patient Account Number: [id number] Date of Birth/Sex: Treating RN: 12/27/1950 (71 y.o. [name] Primary Care Japneet Staggs: [name] Other Clinician: Referring Joshalyn Ancheta: Treating Cranford Blessinger/Extender: [name] Weeks in Treatment: 12 Wound Status Wound Number: 5 Primary Trauma, Other Etiology: Wound Location: Right Lower Leg Wound Open Wounding Event: Trauma Status: Date Acquired: 06/05/2022 Comorbid Cataracts, Arrhythmia, Coronary Artery Disease, Hypertension, Weeks Of Treatment: 3 History: Myocardial Infarction, Peripheral Arterial Disease, Peripheral Venous Clustered Wound: No Disease, Type II Diabetes, Osteoarthritis, Neuropathy, Received Radiation Photos Wound Measurements Length: (cm) 2 Width: (cm) 2.4 Depth: (cm) 0.1 Area: (cm) 3.77 Volume: (cm) 0.377 % Reduction in Area: 43.5% % Reduction in Volume: 43.6% Epithelialization: Small (1-33%) Tunneling: No Undermining: No Wound Description Classification: Full Thickness Without Exposed Support Structures Wound Margin: Distinct, outline attached Exudate Amount: Medium Exudate Type: Serosanguineous Exudate Color: red, brown Foul Odor After Cleansing: No Slough/Fibrino Yes Wound Bed Granulation Amount: Medium (34-66%) Exposed Structure Granulation Quality: Red, Hyper-granulation Fascia Exposed: No Necrotic Amount: Medium (34-66%) Fat Layer (Subcutaneous Tissue) Exposed: Yes Necrotic Quality: Adherent Slough Tendon Exposed: No Muscle Exposed: No Joint Exposed: No Bone Exposed: No Treatment Notes Wound #5 (Lower Leg) Wound Laterality: Right Cleanser Soap and Water Discharge Instruction: May shower and wash wound with dial antibacterial soap and water prior to dressing change. Wound Cleanser Discharge Instruction: Cleanse the wound with wound cleanser prior to applying a clean dressing using gauze sponges, not tissue or cotton  balls. Peri-Wound Care Triamcinolone 15 (g) Discharge Instruction: Use triamcinolone 15 (g) as directed Sween Lotion (Moisturizing lotion) Discharge Instruction: Apply moisturizing lotion as directed Topical Mupirocin Ointment Discharge Instruction: Apply Mupirocin (Bactroban) as instructed Primary Dressing KerraCel Ag Gelling Fiber Dressing, 2x2 in (silver alginate) Discharge Instruction: Apply silver alginate to wound bed as instructed Secondary Dressing Woven Gauze Sponge, Non-Sterile 4x4 in Discharge Instruction: Apply over primary dressing as directed. Secured With [employer] Surgical T 2x10 (in/yd) ape Discharge Instruction: Secure with tape as directed. Compression Wrap Kerlix Roll 4.5x3.1 (in/yd) Discharge Instruction: Apply Kerlix and Coban compression as directed. Coban Self-Adherent Wrap 4x5 (in/yd) Discharge Instruction: Apply over Kerlix as directed. Compression Stockings Add-Ons Electronic Signature(s) Signed: 07/10/2022 10:09:05 AM By: Dellie Catholic RN Entered By: Dellie Catholic on 07/10/2022 08:49:13 -------------------------------------------------------------------------------- Vitals Details Patient Name: Date of Service: [name], Marcus [name] 07/10/2022 8:30 A M Medical Record Number: [id number] Patient Account Number: [id number] Date of Birth/Sex: Treating RN: [birth year] (71 y.o. [name] Primary Care Katalina Magri: [name] Other Clinician: Referring Detrell Umscheid: Treating Jakerra Floyd/Extender: Casandra Doffing in Treatment: 12 Vital Signs Time Taken: 08:37 Temperature (F): 97.8 Height (in): 70 Pulse (bpm): 62 Weight (lbs): 219 Respiratory Rate (breaths/min): 18 Body Mass Index (BMI): 31.4 Blood Pressure (mmHg): 116/70 Reference Range: 80 - 120 mg / dl Electronic Signature(s) Signed: 07/10/2022 10:09:05 AM By: Dellie Catholic RN Entered By: Dellie Catholic on 07/10/2022 08:38:58

## 2022-07-17 ENCOUNTER — Encounter (HOSPITAL_BASED_OUTPATIENT_CLINIC_OR_DEPARTMENT_OTHER): Payer: PPO | Admitting: General Surgery

## 2022-07-17 DIAGNOSIS — L97812 Non-pressure chronic ulcer of other part of right lower leg with fat layer exposed: Secondary | ICD-10-CM | POA: Diagnosis not present

## 2022-07-17 DIAGNOSIS — M1711 Unilateral primary osteoarthritis, right knee: Secondary | ICD-10-CM | POA: Diagnosis not present

## 2022-07-17 DIAGNOSIS — E11622 Type 2 diabetes mellitus with other skin ulcer: Secondary | ICD-10-CM | POA: Diagnosis not present

## 2022-07-17 NOTE — Progress Notes (Signed)
[name], [name] ([id number]) . Visit Report for 07/17/2022 Chief Complaint Document Details Patient Name: Date of Service: [name]. 07/17/2022 7:45 A M Medical Record Number: [id number] Patient Account Number: [id number] Date of Birth/Sex: Treating RN: Sep 13, 1951 (71 y.o. M) Primary Care Provider: [name] Other Clinician: Referring Provider: Treating Provider/Extender: Casandra Doffing in Treatment: 13 Information Obtained from: Patient Chief Complaint Patient seen for complaints of Non-Healing Wounds. Electronic Signature(s) Signed: 07/17/2022 7:57:45 AM By: Fredirick Maudlin MD FACS Entered By: Fredirick Maudlin on 07/17/2022 07:57:45 -------------------------------------------------------------------------------- Debridement Details Patient Name: Date of Service: [name], [name] 07/17/2022 7:45 A M Medical Record Number: [id number] Patient Account Number: [id number] Date of Birth/Sex: Treating RN: 1951/09/16 (71 y.o. [name] Primary Care Provider: [name] Other Clinician: Referring Provider: Treating Provider/Extender: Casandra Doffing in Treatment: 13 Debridement Performed for Assessment: Wound #5 Right Lower Leg Performed By: Physician Fredirick Maudlin, MD Debridement Type: Debridement Level of Consciousness (Pre-procedure): Awake and Alert Pre-procedure Verification/Time Out Yes - 07:53 Taken: Start Time: 07:53 Pain Control: Lidocaine 4% T opical Solution T Area Debrided (L x W): otal 0.7 (cm) x 1.6 (cm) = 1.12 (cm) Tissue and other material debrided: Non-Viable, Slough, Slough Level: Non-Viable Tissue Debridement Description: Selective/Open Wound Instrument: Curette Bleeding: Minimum Hemostasis Achieved: Pressure Procedural Pain: 0 Post Procedural Pain: 0 Response to Treatment: Procedure was tolerated well Level of Consciousness (Post- Awake and Alert procedure): Post  Debridement Measurements of Total Wound Length: (cm) 0.7 Width: (cm) 1.6 Depth: (cm) 0.1 Volume: (cm) 0.088 Character of Wound/Ulcer Post Debridement: Improved Post Procedure Diagnosis Same as Pre-procedure Electronic Signature(s) Signed: 07/17/2022 8:00:41 AM By: Fredirick Maudlin MD FACS Signed: 07/17/2022 5:11:07 PM By: Adline Peals Entered By: Adline Peals on 07/17/2022 07:53:41 -------------------------------------------------------------------------------- HPI Details Patient Name: Date of Service: [name], [name] 07/17/2022 7:45 A M Medical Record Number: [id number] Patient Account Number: [id number] Date of Birth/Sex: Treating RN: Apr 11, 1951 (71 y.o. M) Primary Care Provider: [name] Other Clinician: Referring Provider: Treating Provider/Extender: Casandra Doffing in Treatment: 13 History of Present Illness HPI Description: 11/10/2020 patient presents today for initial evaluation in this clinic although I have seen this patient in [place] previous. Subsequently his issue today is different from when I saw him for I was treating him for a toe ulcer. Currently he is having issues with bilateral lower extremity ulcers after running into a metal bar. Upon inspection today he has wounds of the bilateral lower extremities which are consistent with having struck his legs on the anterior aspect. With that being said the patient did go to the ER for evaluation on October 11 where he was given Keflex initially. He went back to the ER after not improving on October 20 he was given Lasix at that point he states the Lasix seem to help the most. Has been using Neosporin and leaving this open area which is probably not the best way to go either to be honest. He has had arterial studies on March 2021 which showed a left ABI of 0.78 with a TBI of 0.34 and a right ABI of 1.18 with a TBI 1.18. Nonetheless his arterial flow the left is not ideal but  also I think potentially consistent with allowing this to heal. I think he can also support light compression with Kerlix and Coban based on this result. His most recent hemoglobin A1c that we could find was 7.3 and that was towards  the end of 2020. The patient does have a history of heart disease unfortunately. He is also currently still a smoker. 11/24/2020 on evaluation today patient appears to be doing well with regard to his wounds. In fact both appear to be doing better after last week's rather last visit's debridement. Fortunately there is no signs of active infection at this time. No fevers, chills, nausea, vomiting, or diarrhea. Overall I am very pleased with where things stand today. 12/01/2020 on evaluation today patient appears to be doing well with regard to his leg ulcers. He has been tolerating the dressing changes without complication. Fortunately there is no signs of active infection at this time. No fevers, chills, nausea, vomiting, or diarrhea. 12/08/2020 on evaluation today patient appears to be doing very well in regard to his bilateral lower extremity ulcers. They seem to be doing excellent and he is making great progress. There does not appear to be any evidence of active infection at this time. No fevers, chills, nausea, vomiting, or diarrhea. 12/15/2020 upon evaluation today patient actually appears to be doing excellent in regard to his wounds. Has been tolerating the dressing changes without complication. Fortunately his wounds are showing signs of great improvement were using Hydrofera Blue. 12/29/2020 on evaluation today patient appears to be doing well with regard to his right leg which is completely healed. His left leg is also measuring smaller than not healed this is doing very well. Fortunately there is no signs of active infection at this time 01/05/2021 upon evaluation today patient actually appears to be making signs of improvement with regard to his left leg. I am very  pleased with how things are progressing. There is no evidence of active infection at this time. No fevers, chills, nausea, vomiting, or diarrhea. Patient is extremely happy with the fact that this is doing so well 01/12/2021 upon evaluation today patient appears to be doing excellent in regard to his leg ulcer. Fortunately there is no signs of active infection at this time. No fevers, chills, nausea, vomiting, or diarrhea. He has been tolerating the dressing changes without complication. 01/19/2021 upon evaluation today patient appears to be doing well with regard to his wound. In fact this appears to be completely healed on the left leg. This is brand-new skin but overall seems to be doing quite well which is great news. No fevers, chills, nausea, vomiting, or diarrhea. READMISSION 04/17/2022 This is a patient has been seen on a number of occasions in the wound care center. He is 71 years old and has type 2 diabetes mellitus, coronary artery disease, chronic venous insufficiency, and peripheral arterial disease with recent vascular studies demonstrating a right TBI of 0.9 (normal) and a left great toe TBI of 0.33. 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 He recently dropped an object and it hit him in the left shin. He then bumped the same leg against his wagon. He now has 2 wounds on his left anterior lower leg. He says that he applied Aquaphor to keep the areas moist and made an appointment in the wound care center for follow-up. 04/24/2022: Today, he is here with what he describes as 10 out of 10 pain in his wounds. Both of them measured a little bit larger today, but this may be secondary to the debridement that was performed last week. He says that due to it being spring season, he is on his feet quite a bit more. The leg is red and warm. We have been using  silver alginate with Kerlix and Coban wraps. 05/01/2022: PCR culture taken last week grew out MRSA. I prescribed doxycycline. He  has been taking this. Both wounds are a bit smaller today and somewhat less painful. They continue to accumulate a bit of slough. 05/08/2022: He has completed his course of oral doxycycline. We have been using mupirocin under silver alginate with Kerlix and Coban wrapping. Today, both wounds are a bit smaller but have accumulated slough on the surface. They are less tender. 05/23/2022: Both wounds are quite a bit more shallow today. There is a little bit of slough accumulation, but there is also good granulation tissue as well as some buds of epithelialized tissue coming through the wound. No surrounding erythema, induration, nor any purulent drainage. We have been using mupirocin with silver alginate and Kerlix/Coban wrapping. 05/29/2022: The more anterior wound continues to demonstrate encroaching epithelium and the remaining open portion has good granulation tissue. The more lateral wound is nearly closed with just a pinpoint opening. No concern for infection. 06/05/2022: The lateral wound has healed. The more anterior wound is much smaller today with just a little bit of surrounding eschar. The wound surface is healthy-appearing with good granulation tissue. 06/13/2022: The wound on the anterior tibial surface on the left is nearly closed. There is just a tiny opening underneath some eschar. Unfortunately, his dog jumped up on him and cut his right lower leg. There is a flap of nonviable skin with some hematoma and bruising. No surrounding erythema or induration. No purulent discharge. 06/19/2022: The wound on his left leg is closed. The right sided wound is smaller, but there is significant periwound erythema and the leg is more tender. There is slough accumulation on the wound surface. 06/26/2022: The right sided wound is about the same. It is tender but there is less periwound erythema. He still has slough on the wound surface. He has been taking Bactrim as prescribed. 07/03/2022: The PCR culture that  I took demonstrated bacteria not sensitive to Bactrim so he was prescribed levofloxacin, as supported by the culture data. He has been taking that and there has been significant improvement in his degree of pain. The wound itself is smaller with less slough accumulation. There is good granulation tissue emerging. 07/10/2022: The wound has contracted substantially and there is a lot of epithelialized tissue budding through. There is a small amount of slough on the surface. No concern for infection. 07/17/2022: The wound continues to contract and is down to just a fairly small open lesion. There is slough on the wound surface but underneath, the granulation tissue is healthy. No concern for infection. Electronic Signature(s) Signed: 07/17/2022 7:58:20 AM By: Fredirick Maudlin MD FACS Entered By: Fredirick Maudlin on 07/17/2022 07:58:20 -------------------------------------------------------------------------------- Physical Exam Details Patient Name: Date of Service: [name] T. 07/17/2022 7:45 A M Medical Record Number: [id number] Patient Account Number: [id number] Date of Birth/Sex: Treating RN: 1951/09/25 (71 y.o. M) Primary Care Provider: [name] Other Clinician: Referring Provider: Treating Provider/Extender: [name] Weeks in Treatment: 13 Constitutional . . . . No acute distress.Marland Kitchen Respiratory Normal work of breathing on room air.. Notes 07/17/2022: The wound continues to contract and is down to just a fairly small open lesion. There is slough on the wound surface but underneath, the granulation tissue is healthy. No concern for infection. Electronic Signature(s) Signed: 07/17/2022 7:58:49 AM By: Fredirick Maudlin MD FACS Entered By: Fredirick Maudlin on 07/17/2022 07:58:49 -------------------------------------------------------------------------------- Physician Orders Details Patient Name: Date of  Service: [name] 07/17/2022 7:45 A  M Medical Record Number: [id number] Patient Account Number: [id number] Date of Birth/Sex: Treating RN: 1951/12/13 (71 y.o. [name] Primary Care Provider: [name] Other Clinician: Referring Provider: Treating Provider/Extender: Casandra Doffing in Treatment: 65 Verbal / Phone Orders: No Diagnosis Coding ICD-10 Coding Code Description E11.622 Type 2 diabetes mellitus with other skin ulcer I73.9 Peripheral vascular disease, unspecified I87.2 Venous insufficiency (chronic) (peripheral) L97.812 Non-pressure chronic ulcer of other part of right lower leg with fat layer exposed Follow-up Appointments ppointment in 1 week. - Dr Celine Ahr - Room 2 - 7/31 at 7:45 AM Return A Bathing/ Shower/ Hygiene May shower with protection but do not get wound dressing(s) wet. - Please do not get Left Leg wet/damp. Edema Control - Lymphedema / SCD / Other Bilateral Lower Extremities Avoid standing for long periods of time. Exercise regularly Moisturize legs daily. Wound Treatment Wound #5 - Lower Leg Wound Laterality: Right Cleanser: Soap and Water 1 x Per Week/30 Days Discharge Instructions: May shower and wash wound with dial antibacterial soap and water prior to dressing change. Cleanser: Wound Cleanser 1 x Per Week/30 Days Discharge Instructions: Cleanse the wound with wound cleanser prior to applying a clean dressing using gauze sponges, not tissue or cotton balls. Peri-Wound Care: Triamcinolone 15 (g) 1 x Per Week/30 Days Discharge Instructions: Use triamcinolone 15 (g) as directed Peri-Wound Care: Sween Lotion (Moisturizing lotion) 1 x Per Week/30 Days Discharge Instructions: Apply moisturizing lotion as directed Topical: Mupirocin Ointment 1 x Per Week/30 Days Discharge Instructions: Apply Mupirocin (Bactroban) as instructed Prim Dressing: KerraCel Ag Gelling Fiber Dressing, 2x2 in (silver alginate) 1 x Per Week/30 Days ary Discharge Instructions: Apply  silver alginate to wound bed as instructed Secondary Dressing: Woven Gauze Sponge, Non-Sterile 4x4 in 1 x Per Week/30 Days Discharge Instructions: Apply over primary dressing as directed. Secured With: 41M Medipore Public affairs consultant Surgical T 2x10 (in/yd) 1 x Per Week/30 Days ape Discharge Instructions: Secure with tape as directed. Compression Wrap: Kerlix Roll 4.5x3.1 (in/yd) 1 x Per Week/30 Days Discharge Instructions: Apply Kerlix and Coban compression as directed. Compression Wrap: Coban Self-Adherent Wrap 4x5 (in/yd) 1 x Per Week/30 Days Discharge Instructions: Apply over Kerlix as directed. Electronic Signature(s) Signed: 07/17/2022 7:59:00 AM By: Fredirick Maudlin MD FACS Entered By: Fredirick Maudlin on 07/17/2022 07:58:59 -------------------------------------------------------------------------------- Problem List Details Patient Name: Date of Service: [name], [name] 07/17/2022 7:45 A M Medical Record Number: [id number] Patient Account Number: [id number] Date of Birth/Sex: Treating RN: 01-Jun-1951 (71 y.o. M) Primary Care Provider: [name] Other Clinician: Referring Provider: Treating Provider/Extender: Casandra Doffing in Treatment: 13 Active Problems ICD-10 Encounter Code Description Active Date MDM Diagnosis E11.622 Type 2 diabetes mellitus with other skin ulcer 04/17/2022 No Yes I73.9 Peripheral vascular disease, unspecified 04/17/2022 No Yes I87.2 Venous insufficiency (chronic) (peripheral) 04/17/2022 No Yes L97.812 Non-pressure chronic ulcer of other part of right lower leg with fat layer 06/13/2022 No Yes exposed Inactive Problems Resolved Problems ICD-10 Code Description Active Date Resolved Date L97.822 Non-pressure chronic ulcer of other part of left lower leg with fat layer exposed 04/17/2022 04/17/2022 Electronic Signature(s) Signed: 07/17/2022 7:57:26 AM By: Fredirick Maudlin MD FACS Entered By: Fredirick Maudlin on 07/17/2022  07:57:26 -------------------------------------------------------------------------------- Progress Note Details Patient Name: Date of Service: [name], [name] 07/17/2022 7:45 A M Medical Record Number: [id number] Patient Account Number: [id number] Date of Birth/Sex: Treating RN: July 22, 1951 (71 y.o. M) Primary  Care Provider: [name] Other Clinician: Referring Provider: Treating Provider/Extender: Casandra Doffing in Treatment: 13 Subjective Chief Complaint Information obtained from Patient Patient seen for complaints of Non-Healing Wounds. History of Present Illness (HPI) 11/10/2020 patient presents today for initial evaluation in this clinic although I have seen this patient in [place] previous. Subsequently his issue today is different from when I saw him for I was treating him for a toe ulcer. Currently he is having issues with bilateral lower extremity ulcers after running into a metal bar. Upon inspection today he has wounds of the bilateral lower extremities which are consistent with having struck his legs on the anterior aspect. With that being said the patient did go to the ER for evaluation on October 11 where he was given Keflex initially. He went back to the ER after not improving on October 20 he was given Lasix at that point he states the Lasix seem to help the most. Has been using Neosporin and leaving this open area which is probably not the best way to go either to be honest. He has had arterial studies on March 2021 which showed a left ABI of 0.78 with a TBI of 0.34 and a right ABI of 1.18 with a TBI 1.18. Nonetheless his arterial flow the left is not ideal but also I think potentially consistent with allowing this to heal. I think he can also support light compression with Kerlix and Coban based on this result. His most recent hemoglobin A1c that we could find was 7.3 and that was towards the end of 2020. The patient does have a history of  heart disease unfortunately. He is also currently still a smoker. 11/24/2020 on evaluation today patient appears to be doing well with regard to his wounds. In fact both appear to be doing better after last week's rather last visit's debridement. Fortunately there is no signs of active infection at this time. No fevers, chills, nausea, vomiting, or diarrhea. Overall I am very pleased with where things stand today. 12/01/2020 on evaluation today patient appears to be doing well with regard to his leg ulcers. He has been tolerating the dressing changes without complication. Fortunately there is no signs of active infection at this time. No fevers, chills, nausea, vomiting, or diarrhea. 12/08/2020 on evaluation today patient appears to be doing very well in regard to his bilateral lower extremity ulcers. They seem to be doing excellent and he is making great progress. There does not appear to be any evidence of active infection at this time. No fevers, chills, nausea, vomiting, or diarrhea. 12/15/2020 upon evaluation today patient actually appears to be doing excellent in regard to his wounds. Has been tolerating the dressing changes without complication. Fortunately his wounds are showing signs of great improvement were using Hydrofera Blue. 12/29/2020 on evaluation today patient appears to be doing well with regard to his right leg which is completely healed. His left leg is also measuring smaller than not healed this is doing very well. Fortunately there is no signs of active infection at this time 01/05/2021 upon evaluation today patient actually appears to be making signs of improvement with regard to his left leg. I am very pleased with how things are progressing. There is no evidence of active infection at this time. No fevers, chills, nausea, vomiting, or diarrhea. Patient is extremely happy with the fact that this is doing so well 01/12/2021 upon evaluation today patient appears to be doing excellent  in regard to his leg  ulcer. Fortunately there is no signs of active infection at this time. No fevers, chills, nausea, vomiting, or diarrhea. He has been tolerating the dressing changes without complication. 01/19/2021 upon evaluation today patient appears to be doing well with regard to his wound. In fact this appears to be completely healed on the left leg. This is brand-new skin but overall seems to be doing quite well which is great news. No fevers, chills, nausea, vomiting, or diarrhea. READMISSION 04/17/2022 This is a patient has been seen on a number of occasions in the wound care center. He is 71 years old and has type 2 diabetes mellitus, coronary artery disease, chronic venous insufficiency, and peripheral arterial disease with recent vascular studies demonstrating a right TBI of 0.9 (normal) and a left great toe TBI of 0.33. 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 He recently dropped an object and it hit him in the left shin. He then bumped the same leg against his wagon. He now has 2 wounds on his left anterior lower leg. He says that he applied Aquaphor to keep the areas moist and made an appointment in the wound care center for follow-up. 04/24/2022: Today, he is here with what he describes as 10 out of 10 pain in his wounds. Both of them measured a little bit larger today, but this may be secondary to the debridement that was performed last week. He says that due to it being spring season, he is on his feet quite a bit more. The leg is red and warm. We have been using silver alginate with Kerlix and Coban wraps. 05/01/2022: PCR culture taken last week grew out MRSA. I prescribed doxycycline. He has been taking this. Both wounds are a bit smaller today and somewhat less painful. They continue to accumulate a bit of slough. 05/08/2022: He has completed his course of oral doxycycline. We have been using mupirocin under silver alginate with Kerlix and Coban wrapping. Today,  both wounds are a bit smaller but have accumulated slough on the surface. They are less tender. 05/23/2022: Both wounds are quite a bit more shallow today. There is a little bit of slough accumulation, but there is also good granulation tissue as well as some buds of epithelialized tissue coming through the wound. No surrounding erythema, induration, nor any purulent drainage. We have been using mupirocin with silver alginate and Kerlix/Coban wrapping. 05/29/2022: The more anterior wound continues to demonstrate encroaching epithelium and the remaining open portion has good granulation tissue. The more lateral wound is nearly closed with just a pinpoint opening. No concern for infection. 06/05/2022: The lateral wound has healed. The more anterior wound is much smaller today with just a little bit of surrounding eschar. The wound surface is healthy-appearing with good granulation tissue. 06/13/2022: The wound on the anterior tibial surface on the left is nearly closed. There is just a tiny opening underneath some eschar. Unfortunately, his dog jumped up on him and cut his right lower leg. There is a flap of nonviable skin with some hematoma and bruising. No surrounding erythema or induration. No purulent discharge. 06/19/2022: The wound on his left leg is closed. The right sided wound is smaller, but there is significant periwound erythema and the leg is more tender. There is slough accumulation on the wound surface. 06/26/2022: The right sided wound is about the same. It is tender but there is less periwound erythema. He still has slough on the wound surface. He has been taking Bactrim as prescribed. 07/03/2022:  The PCR culture that I took demonstrated bacteria not sensitive to Bactrim so he was prescribed levofloxacin, as supported by the culture data. He has been taking that and there has been significant improvement in his degree of pain. The wound itself is smaller with less slough accumulation. There is  good granulation tissue emerging. 07/10/2022: The wound has contracted substantially and there is a lot of epithelialized tissue budding through. There is a small amount of slough on the surface. No concern for infection. 07/17/2022: The wound continues to contract and is down to just a fairly small open lesion. There is slough on the wound surface but underneath, the granulation tissue is healthy. No concern for infection. Patient History Information obtained from Patient. Social History Current every day smoker - 1 PPD, Marital Status - Married, Alcohol Use - Rarely, Drug Use - No History, Caffeine Use - Daily - coffee, soda. Medical History Eyes Patient has history of Cataracts - both eyes Cardiovascular Patient has history of Arrhythmia - A-fib, Coronary Artery Disease, Hypertension, Myocardial Infarction - CABG x 4, Peripheral Arterial Disease, Peripheral Venous Disease Endocrine Patient has history of Type II Diabetes Musculoskeletal Patient has history of Osteoarthritis Neurologic Patient has history of Neuropathy Oncologic Patient has history of Received Radiation Medical A Surgical History Notes nd Cardiovascular Hyperlipidemia, CVA, AAA Oncologic Prostate cancer Objective Constitutional No acute distress.. Vitals Time Taken: 7:41 AM, Height: 70 in, Weight: 219 lbs, BMI: 31.4, Temperature: 98.3 F, Pulse: 71 bpm, Respiratory Rate: 18 breaths/min, Blood Pressure: 119/68 mmHg. Respiratory Normal work of breathing on room air.. General Notes: 07/17/2022: The wound continues to contract and is down to just a fairly small open lesion. There is slough on the wound surface but underneath, the granulation tissue is healthy. No concern for infection. Integumentary (Hair, Skin) Wound #5 status is Open. Original cause of wound was Trauma. The date acquired was: 06/05/2022. The wound has been in treatment 4 weeks. The wound is located on the Right Lower Leg. The wound measures  0.7cm length x 1.6cm width x 0.1cm depth; 0.88cm^2 area and 0.088cm^3 volume. There is Fat Layer (Subcutaneous Tissue) exposed. There is no tunneling or undermining noted. There is a medium amount of serosanguineous drainage noted. The wound margin is distinct with the outline attached to the wound base. There is large (67-100%) red granulation within the wound bed. There is a small (1-33%) amount of necrotic tissue within the wound bed including Adherent Slough. Assessment Active Problems ICD-10 Type 2 diabetes mellitus with other skin ulcer Peripheral vascular disease, unspecified Venous insufficiency (chronic) (peripheral) Non-pressure chronic ulcer of other part of right lower leg with fat layer exposed Procedures Wound #5 Pre-procedure diagnosis of Wound #5 is a Trauma, Other located on the Right Lower Leg . There was a Selective/Open Wound Non-Viable Tissue Debridement with a total area of 1.12 sq cm performed by Fredirick Maudlin, MD. With the following instrument(s): Curette to remove Non-Viable tissue/material. Material removed includes [facility] after achieving pain control using Lidocaine 4% Topical Solution. No specimens were taken. A time out was conducted at 07:53, prior to the start of the procedure. A Minimum amount of bleeding was controlled with Pressure. The procedure was tolerated well with a pain level of 0 throughout and a pain level of 0 following the procedure. Post Debridement Measurements: 0.7cm length x 1.6cm width x 0.1cm depth; 0.088cm^3 volume. Character of Wound/Ulcer Post Debridement is improved. Post procedure Diagnosis Wound #5: Same as Pre-Procedure Plan Follow-up Appointments: Return Appointment in 1 week. -  Dr Celine Ahr - Room 2 - 7/31 at 7:45 AM Bathing/ Shower/ Hygiene: May shower with protection but do not get wound dressing(s) wet. - Please do not get Left Leg wet/damp. Edema Control - Lymphedema / SCD / Other: Avoid standing for long periods of  time. Exercise regularly Moisturize legs daily. WOUND #5: - Lower Leg Wound Laterality: Right Cleanser: Soap and Water 1 x Per Week/30 Days Discharge Instructions: May shower and wash wound with dial antibacterial soap and water prior to dressing change. Cleanser: Wound Cleanser 1 x Per Week/30 Days Discharge Instructions: Cleanse the wound with wound cleanser prior to applying a clean dressing using gauze sponges, not tissue or cotton balls. Peri-Wound Care: Triamcinolone 15 (g) 1 x Per Week/30 Days Discharge Instructions: Use triamcinolone 15 (g) as directed Peri-Wound Care: Sween Lotion (Moisturizing lotion) 1 x Per Week/30 Days Discharge Instructions: Apply moisturizing lotion as directed Topical: Mupirocin Ointment 1 x Per Week/30 Days Discharge Instructions: Apply Mupirocin (Bactroban) as instructed Prim Dressing: KerraCel Ag Gelling Fiber Dressing, 2x2 in (silver alginate) 1 x Per Week/30 Days ary Discharge Instructions: Apply silver alginate to wound bed as instructed Secondary Dressing: Woven Gauze Sponge, Non-Sterile 4x4 in 1 x Per Week/30 Days Discharge Instructions: Apply over primary dressing as directed. Secured With: 82M [employer] Public affairs consultant Surgical T 2x10 (in/yd) 1 x Per Week/30 Days ape Discharge Instructions: Secure with tape as directed. Com pression Wrap: Kerlix Roll 4.5x3.1 (in/yd) 1 x Per Week/30 Days Discharge Instructions: Apply Kerlix and Coban compression as directed. Com pression Wrap: Coban Self-Adherent Wrap 4x5 (in/yd) 1 x Per Week/30 Days Discharge Instructions: Apply over Kerlix as directed. 07/17/2022: The wound continues to contract and is down to just a fairly small open lesion. There is slough on the wound surface but underneath, the granulation tissue is healthy. No concern for infection. I used a curette to debride the slough off of the wound surface. We will continue using silver alginate with Kerlix and Coban, but due to some irritation of his foot  from the wrap, we are just going to do it from the ankle to the knee. He will follow-up in 1 week's time. Electronic Signature(s) Signed: 07/17/2022 7:59:34 AM By: Fredirick Maudlin MD FACS Entered By: Fredirick Maudlin on 07/17/2022 07:59:34 -------------------------------------------------------------------------------- HxROS Details Patient Name: Date of Service: [name], [name] 07/17/2022 7:45 A M Medical Record Number: [id number] Patient Account Number: [id number] Date of Birth/Sex: Treating RN: 1951-03-27 (71 y.o. M) Primary Care Provider: [name] Other Clinician: Referring Provider: Treating Provider/Extender: Casandra Doffing in Treatment: 13 Information Obtained From Patient Eyes Medical History: Positive for: Cataracts - both eyes Cardiovascular Medical History: Positive for: Arrhythmia - A-fib; Coronary Artery Disease; Hypertension; Myocardial Infarction - CABG x 4; Peripheral Arterial Disease; Peripheral Venous Disease Past Medical History Notes: Hyperlipidemia, CVA, AAA Endocrine Medical History: Positive for: Type II Diabetes Time with diabetes: over 5 years Treated with: Oral agents Blood sugar tested every day: No Musculoskeletal Medical History: Positive for: Osteoarthritis Neurologic Medical History: Positive for: Neuropathy Oncologic Medical History: Positive for: Received Radiation Past Medical History Notes: Prostate cancer HBO Extended History Items Eyes: Cataracts Immunizations Pneumococcal Vaccine: Received Pneumococcal Vaccination: Yes Received Pneumococcal Vaccination On or After 60th Birthday: Yes Implantable Devices None Family and Social History Current every day smoker - 1 PPD; Marital Status - Married; Alcohol Use: Rarely; Drug Use: No History; Caffeine Use: Daily - coffee, soda; Financial Concerns: No; Food, Clothing or Shelter Needs: No; Support System Lacking: No;  Transportation Concerns:  No Electronic Signature(s) Signed: 07/17/2022 8:00:41 AM By: Fredirick Maudlin MD FACS Entered By: Fredirick Maudlin on 07/17/2022 07:58:28 -------------------------------------------------------------------------------- SuperBill Details Patient Name: Date of Service: [name], [name] 07/17/2022 Medical Record Number: [id number] Patient Account Number: [id number] Date of Birth/Sex: Treating RN: [birth year] (71 y.o. M) Primary Care Provider: [name] Other Clinician: Referring Provider: Treating Provider/Extender: Casandra Doffing in Treatment: 13 Diagnosis Coding ICD-10 Codes Code Description E11.622 Type 2 diabetes mellitus with other skin ulcer I73.9 Peripheral vascular disease, unspecified I87.2 Venous insufficiency (chronic) (peripheral) L97.812 Non-pressure chronic ulcer of other part of right lower leg with fat layer exposed Facility Procedures CPT4 Code: [id number] Description: [phone] - DEBRIDE WOUND 1ST 20 SQ CM OR < ICD-10 Diagnosis Description L97.812 Non-pressure chronic ulcer of other part of right lower leg with fat layer expos Modifier: ed Quantity: 1 Physician Procedures : CPT4 Code Description Modifier [id number] - WC PHYS LEVEL 3 - EST PT 25 ICD-10 Diagnosis Description K82.060 Non-pressure chronic ulcer of other part of right lower leg with fat layer exposed I87.2 Venous insufficiency (chronic) (peripheral) I73.9  Peripheral vascular disease, unspecified E11.622 Type 2 diabetes mellitus with other skin ulcer Quantity: 1 : [id number] - WC PHYS DEBR WO ANESTH 20 SQ CM 1 ICD-10 Diagnosis Description H43.276 Non-pressure chronic ulcer of other part of right lower leg with fat layer exposed Quantity: Electronic Signature(s) Signed: 07/17/2022 8:00:17 AM By: Fredirick Maudlin MD FACS Entered By: Fredirick Maudlin on 07/17/2022 08:00:16

## 2022-07-17 NOTE — Progress Notes (Signed)
SANAV, REMER ([id number]) . Visit Report for 07/17/2022 Arrival Information Details Patient Name: Date of Service: [name]. 07/17/2022 7:45 A M Medical Record Number: [id number] Patient Account Number: [id number] Date of Birth/Sex: Treating RN: 01-17-1951 (71 y.o. [name] Primary Care Rowena Moilanen: [name] Other Clinician: Referring Jonnathan Birman: Treating Wyndi Northrup/Extender: Casandra Doffing in Treatment: 71 Visit Information History Since Last Visit Added or deleted any medications: No Patient Arrived: Ambulatory Any new allergies or adverse reactions: No Arrival Time: 07:35 Had a fall or experienced change in No Accompanied By: self activities of daily living that may affect Transfer Assistance: None risk of falls: Patient Identification Verified: Yes Signs or symptoms of abuse/neglect since last visito No Secondary Verification Process Completed: Yes Hospitalized since last visit: No Patient Requires Transmission-Based Precautions: No Implantable device outside of the clinic excluding No Patient Has Alerts: Yes cellular tissue based products placed in the center Patient Alerts: Patient on Blood Thinner since last visit: ABI R 1.11 Has Dressing in Place as Prescribed: Yes ABI L 0.72 Has Compression in Place as Prescribed: Yes TBI L 0.33 Pain Present Now: No Electronic Signature(s) Signed: 07/17/2022 5:11:07 PM By: Adline Peals Entered By: Adline Peals on 07/17/2022 07:40:44 -------------------------------------------------------------------------------- Encounter Discharge Information Details Patient Name: Date of Service: [name], Marcus [name] 07/17/2022 7:45 A M Medical Record Number: [id number] Patient Account Number: [id number] Date of Birth/Sex: Treating RN: December 04, 1951 (71 y.o. [name] Primary Care Evelyne Makepeace: [name] Other Clinician: Referring Alexiz Sustaita: Treating Clide Remmers/Extender:  Casandra Doffing in Treatment: 13 Encounter Discharge Information Items Post Procedure Vitals Discharge Condition: Stable Temperature (F): 98.3 Ambulatory Status: Ambulatory Pulse (bpm): 71 Discharge Destination: Home Respiratory Rate (breaths/min): 18 Transportation: Private Auto Blood Pressure (mmHg): 119/68 Accompanied By: self Schedule Follow-up Appointment: Yes Clinical Summary of Care: Patient Declined Electronic Signature(s) Signed: 07/17/2022 5:11:07 PM By: Adline Peals Entered By: Adline Peals on 07/17/2022 08:10:15 -------------------------------------------------------------------------------- Lower Extremity Assessment Details Patient Name: Date of Service: [name] 07/17/2022 7:45 A M Medical Record Number: [id number] Patient Account Number: [id number] Date of Birth/Sex: Treating RN: 1951-07-26 (71 y.o. [name] Primary Care Obadiah Dennard: [name] Other Clinician: Referring Rut Betterton: Treating Tremell Reimers/Extender: [name] Weeks in Treatment: 13 Edema Assessment Assessed: [Left: No] Patrice Paradise: No] Edema: [Left: Ye] [Right: s] Calf Left: Right: Point of Measurement: 34 cm From Medial Instep 36.1 cm 38.2 cm Ankle Left: Right: Point of Measurement: 12 cm From Medial Instep 25.5 cm 25 cm Vascular Assessment Pulses: Dorsalis Pedis Palpable: [Right:Yes] Electronic Signature(s) Signed: 07/17/2022 5:11:07 PM By: Adline Peals Entered By: Adline Peals on 07/17/2022 07:47:36 -------------------------------------------------------------------------------- Multi Wound Chart Details Patient Name: Date of Service: [name], Marcus [name] 07/17/2022 7:45 A M Medical Record Number: [id number] Patient Account Number: [id number] Date of Birth/Sex: Treating RN: [birth year] (71 y.o. M) Primary Care Cleta Heatley: [name] Other Clinician: Referring Derin Granquist: Treating  Cecilio Ohlrich/Extender: Casandra Doffing in Treatment: 13 Vital Signs Height(in): 70 Pulse(bpm): 66 Weight(lbs): 219 Blood Pressure(mmHg): 119/68 Body Mass Index(BMI): 31.4 Temperature(F): 98.3 Respiratory Rate(breaths/min): 18 Photos: [N/A:N/A] Right Lower Leg N/A N/A Wound Location: Trauma N/A N/A Wounding Event: Trauma, Other N/A N/A Primary Etiology: Cataracts, Arrhythmia, Coronary N/A N/A Comorbid History: Artery Disease, Hypertension, Myocardial Infarction, Peripheral Arterial Disease, Peripheral Venous Disease, Type II Diabetes, Osteoarthritis, Neuropathy, Received Radiation 06/05/2022 N/A N/A Date Acquired: 4 N/A N/A Weeks of Treatment: Open N/A N/A Wound Status: No N/A N/A Wound  Recurrence: 0.7x1.6x0.1 N/A N/A Measurements L x W x D (cm) 0.88 N/A N/A A (cm) : rea 0.088 N/A N/A Volume (cm) : 86.80% N/A N/A % Reduction in A rea: 86.80% N/A N/A % Reduction in Volume: Full Thickness Without Exposed N/A N/A Classification: Support Structures Medium N/A N/A Exudate A mount: Serosanguineous N/A N/A Exudate Type: red, brown N/A N/A Exudate Color: Distinct, outline attached N/A N/A Wound Margin: Large (67-100%) N/A N/A Granulation A mount: Red N/A N/A Granulation Quality: Small (1-33%) N/A N/A Necrotic A mount: Fat Layer (Subcutaneous Tissue): Yes N/A N/A Exposed Structures: Fascia: No Tendon: No Muscle: No Joint: No Bone: No Medium (34-66%) N/A N/A Epithelialization: Debridement - Selective/Open Wound N/A N/A Debridement: Pre-procedure Verification/Time Out 07:53 N/A N/A Taken: Lidocaine 4% Topical Solution N/A N/A Pain Control: Slough N/A N/A Tissue Debrided: Non-Viable Tissue N/A N/A Level: 1.12 N/A N/A Debridement A (sq cm): rea Curette N/A N/A Instrument: Minimum N/A N/A Bleeding: Pressure N/A N/A Hemostasis A chieved: 0 N/A N/A Procedural Pain: 0 N/A N/A Post Procedural Pain: Procedure was  tolerated well N/A N/A Debridement Treatment Response: 0.7x1.6x0.1 N/A N/A Post Debridement Measurements L x W x D (cm) 0.088 N/A N/A Post Debridement Volume: (cm) Debridement N/A N/A Procedures Performed: Treatment Notes Electronic Signature(s) Signed: 07/17/2022 7:57:38 AM By: Fredirick Maudlin MD FACS Entered By: Fredirick Maudlin on 07/17/2022 07:57:37 -------------------------------------------------------------------------------- Multi-Disciplinary Care Plan Details Patient Name: Date of Service: [name], Marcus [name] 07/17/2022 7:45 A M Medical Record Number: [id number] Patient Account Number: [id number] Date of Birth/Sex: Treating RN: 01/28/1951 (71 y.o. [name] Primary Care Katelynn Heidler: [name] Other Clinician: Referring Roselle Norton: Treating Raylee Strehl/Extender: [name] Weeks in Treatment: 13 Active Inactive Wound/Skin Impairment Nursing Diagnoses: Impaired tissue integrity Goals: Patient/caregiver will verbalize understanding of skin care regimen Date Initiated: 04/17/2022 Target Resolution Date: 07/21/2022 Goal Status: Active Interventions: Assess ulceration(s) every visit Treatment Activities: Skin care regimen initiated : 04/17/2022 Notes: Electronic Signature(s) Signed: 07/17/2022 5:11:07 PM By: Adline Peals Entered By: Adline Peals on 07/17/2022 07:50:45 -------------------------------------------------------------------------------- Pain Assessment Details Patient Name: Date of Service: Marcus Milliner T. 07/17/2022 7:45 A M Medical Record Number: [id number] Patient Account Number: [id number] Date of Birth/Sex: Treating RN: 1951-08-13 (71 y.o. [name] Primary Care Alexei Ey: [name] Other Clinician: Referring Brooks Stotz: Treating Monda Chastain/Extender: Casandra Doffing in Treatment: 13 Active Problems Location of Pain Severity and Description of Pain Patient Has  Paino No Site Locations Rate the pain. Current Pain Level: 0 Pain Management and Medication Current Pain Management: Electronic Signature(s) Signed: 07/17/2022 5:11:07 PM By: Adline Peals Entered By: Adline Peals on 07/17/2022 07:40:54 -------------------------------------------------------------------------------- Patient/Caregiver Education Details Patient Name: Date of Service: [name] 7/24/2023andnbsp7:45 A M Medical Record Number: [id number] Patient Account Number: [id number] Date of Birth/Gender: Treating RN: [birth year] (71 y.o. [name] Primary Care Physician: [name] Other Clinician: Referring Physician: Treating Physician/Extender: Casandra Doffing in Treatment: 13 Education Assessment Education Provided To: Patient Education Topics Provided Wound/Skin Impairment: Methods: Explain/Verbal Responses: Reinforcements needed, State content correctly Electronic Signature(s) Signed: 07/17/2022 5:11:07 PM By: Adline Peals Entered By: Adline Peals on 07/17/2022 07:50:58 -------------------------------------------------------------------------------- Wound Assessment Details Patient Name: Date of Service: Marcus Milliner T. 07/17/2022 7:45 A M Medical Record Number: [id number] Patient Account Number: [id number] Date of Birth/Sex: Treating RN: [birth year] (71 y.o. [name] Primary Care Indi Willhite: [name] Other Clinician: Referring Anwen Cannedy: Treating Demetris Meinhardt/Extender: [name] Weeks in  Treatment: 13 Wound Status Wound Number: 5 Primary Trauma, Other Etiology: Wound Location: Right Lower Leg Wound Open Wounding Event: Trauma Status: Date Acquired: 06/05/2022 Comorbid Cataracts, Arrhythmia, Coronary Artery Disease, Hypertension, Weeks Of Treatment: 4 History: Myocardial Infarction, Peripheral Arterial Disease, Peripheral Venous Clustered Wound:  No Disease, Type II Diabetes, Osteoarthritis, Neuropathy, Received Radiation Photos Wound Measurements Length: (cm) 0.7 Width: (cm) 1.6 Depth: (cm) 0.1 Area: (cm) 0.88 Volume: (cm) 0.088 % Reduction in Area: 86.8% % Reduction in Volume: 86.8% Epithelialization: Medium (34-66%) Tunneling: No Undermining: No Wound Description Classification: Full Thickness Without Exposed Support Structures Wound Margin: Distinct, outline attached Exudate Amount: Medium Exudate Type: Serosanguineous Exudate Color: red, brown Foul Odor After Cleansing: No Slough/Fibrino Yes Wound Bed Granulation Amount: Large (67-100%) Exposed Structure Granulation Quality: Red Fascia Exposed: No Necrotic Amount: Small (1-33%) Fat Layer (Subcutaneous Tissue) Exposed: Yes Necrotic Quality: Adherent Slough Tendon Exposed: No Muscle Exposed: No Joint Exposed: No Bone Exposed: No Treatment Notes Wound #5 (Lower Leg) Wound Laterality: Right Cleanser Soap and Water Discharge Instruction: May shower and wash wound with dial antibacterial soap and water prior to dressing change. Wound Cleanser Discharge Instruction: Cleanse the wound with wound cleanser prior to applying a clean dressing using gauze sponges, not tissue or cotton balls. Peri-Wound Care Triamcinolone 15 (g) Discharge Instruction: Use triamcinolone 15 (g) as directed Sween Lotion (Moisturizing lotion) Discharge Instruction: Apply moisturizing lotion as directed Topical Mupirocin Ointment Discharge Instruction: Apply Mupirocin (Bactroban) as instructed Primary Dressing KerraCel Ag Gelling Fiber Dressing, 2x2 in (silver alginate) Discharge Instruction: Apply silver alginate to wound bed as instructed Secondary Dressing Woven Gauze Sponge, Non-Sterile 4x4 in Discharge Instruction: Apply over primary dressing as directed. Secured With [employer] Surgical T 2x10 (in/yd) ape Discharge Instruction: Secure with tape as  directed. Compression Wrap Kerlix Roll 4.5x3.1 (in/yd) Discharge Instruction: Apply Kerlix and Coban compression as directed. Coban Self-Adherent Wrap 4x5 (in/yd) Discharge Instruction: Apply over Kerlix as directed. Compression Stockings Add-Ons Electronic Signature(s) Signed: 07/17/2022 5:11:07 PM By: Adline Peals Entered By: Adline Peals on 07/17/2022 07:53:52 -------------------------------------------------------------------------------- Vitals Details Patient Name: Date of Service: [name], Marcus [name] 07/17/2022 7:45 A M Medical Record Number: [id number] Patient Account Number: [id number] Date of Birth/Sex: Treating RN: March 06, 1951 (71 y.o. [name] Primary Care Laquetta Racey: [name] Other Clinician: Referring Terrisha Lopata: Treating Caroline Longie/Extender: Casandra Doffing in Treatment: 13 Vital Signs Time Taken: 07:41 Temperature (F): 98.3 Height (in): 70 Pulse (bpm): 71 Weight (lbs): 219 Respiratory Rate (breaths/min): 18 Body Mass Index (BMI): 31.4 Blood Pressure (mmHg): 119/68 Reference Range: 80 - 120 mg / dl Electronic Signature(s) Signed: 07/17/2022 5:11:07 PM By: Adline Peals Entered By: Adline Peals on 07/17/2022 07:41:28

## 2022-07-24 ENCOUNTER — Encounter (HOSPITAL_BASED_OUTPATIENT_CLINIC_OR_DEPARTMENT_OTHER): Payer: PPO | Admitting: General Surgery

## 2022-07-24 DIAGNOSIS — E11622 Type 2 diabetes mellitus with other skin ulcer: Secondary | ICD-10-CM | POA: Diagnosis not present

## 2022-07-24 DIAGNOSIS — L97812 Non-pressure chronic ulcer of other part of right lower leg with fat layer exposed: Secondary | ICD-10-CM | POA: Diagnosis not present

## 2022-07-24 NOTE — Progress Notes (Signed)
[name], [name] ([id number]) . Visit Report for 07/24/2022 Arrival Information Details Patient Name: Date of Service: [name]. 07/24/2022 7:45 A M Medical Record Number: [id number] Patient Account Number: [id number] Date of Birth/Sex: Treating RN: October 18, 1951 (71 y.o. [name] Primary Care Kioni Stahl: [name] Other Clinician: Referring Bonni Neuser: Treating Hashir Deleeuw/Extender: Casandra Doffing in Treatment: 14 Visit Information History Since Last Visit Added or deleted any medications: No Patient Arrived: Ambulatory Any new allergies or adverse reactions: No Arrival Time: 07:41 Had a fall or experienced change in No Accompanied By: self activities of daily living that may affect Transfer Assistance: None risk of falls: Patient Identification Verified: Yes Signs or symptoms of abuse/neglect since last visito No Secondary Verification Process Completed: Yes Hospitalized since last visit: No Patient Requires Transmission-Based Precautions: No Implantable device outside of the clinic excluding No Patient Has Alerts: Yes cellular tissue based products placed in the center Patient Alerts: Patient on Blood Thinner since last visit: ABI R 1.11 Has Dressing in Place as Prescribed: Yes ABI L 0.72 Has Compression in Place as Prescribed: Yes TBI L 0.33 Pain Present Now: No Electronic Signature(s) Signed: 07/24/2022 4:29:27 PM By: Adline Peals Entered By: Adline Peals on 07/24/2022 07:41:57 -------------------------------------------------------------------------------- Encounter Discharge Information Details Patient Name: Date of Service: [name], Marcus [name] 07/24/2022 7:45 A M Medical Record Number: [id number] Patient Account Number: [id number] Date of Birth/Sex: Treating RN: December 14, 1951 (71 y.o. [name] Primary Care Tyreshia Ingman: [name] Other Clinician: Referring Sudiksha Victor: Treating Royalty Fakhouri/Extender:  Casandra Doffing in Treatment: 14 Encounter Discharge Information Items Post Procedure Vitals Discharge Condition: Stable Temperature (F): 98.6 Ambulatory Status: Ambulatory Pulse (bpm): 62 Discharge Destination: Home Respiratory Rate (breaths/min): 18 Transportation: Private Auto Blood Pressure (mmHg): 129/76 Accompanied By: self Schedule Follow-up Appointment: Yes Clinical Summary of Care: Patient Declined Electronic Signature(s) Signed: 07/24/2022 4:29:27 PM By: Adline Peals Entered By: Adline Peals on 07/24/2022 08:10:28 -------------------------------------------------------------------------------- Lower Extremity Assessment Details Patient Name: Date of Service: Marcus Milliner T. 07/24/2022 7:45 A M Medical Record Number: [id number] Patient Account Number: [id number] Date of Birth/Sex: Treating RN: [birth year] (71 y.o. [name] Primary Care Destin Kittler: [name] Other Clinician: Referring Akita Maxim: Treating Lexandra Rettke/Extender: [name] Weeks in Treatment: 14 Edema Assessment Assessed: Shirlyn Goltz: No] Patrice Paradise: No] Edema: [Left: Ye] [Right: s] Calf Left: Right: Point of Measurement: 34 cm From Medial Instep 36.1 cm 38.9 cm Ankle Left: Right: Point of Measurement: 12 cm From Medial Instep 25.5 cm 25.4 cm Vascular Assessment Pulses: Dorsalis Pedis Palpable: [Right:Yes] Electronic Signature(s) Signed: 07/24/2022 4:29:27 PM By: Adline Peals Entered By: Adline Peals on 07/24/2022 07:47:58 -------------------------------------------------------------------------------- Multi Wound Chart Details Patient Name: Date of Service: [name], Marcus [name] 07/24/2022 7:45 A M Medical Record Number: [id number] Patient Account Number: [id number] Date of Birth/Sex: Treating RN: July 27, 1951 (71 y.o. M) Primary Care Aleigh Grunden: [name] Other Clinician: Referring Alton Bouknight: Treating  Kimo Bancroft/Extender: Casandra Doffing in Treatment: 14 Vital Signs Height(in): 70 Pulse(bpm): 60 Weight(lbs): 219 Blood Pressure(mmHg): 129/76 Body Mass Index(BMI): 31.4 Temperature(F): 98.6 Respiratory Rate(breaths/min): 18 Photos: [5:No Photos Right Lower Leg] [N/A:N/A N/A] Wound Location: [5:Trauma] [N/A:N/A] Wounding Event: [5:Trauma, Other] [N/A:N/A] Primary Etiology: [5:Cataracts, Arrhythmia, Coronary] [N/A:N/A] Comorbid History: [5:Artery Disease, Hypertension, Myocardial Infarction, Peripheral Arterial Disease, Peripheral Venous Disease, Type II Diabetes, Osteoarthritis, Neuropathy, Received Radiation 06/05/2022] [N/A:N/A] Date Acquired: [5:5] [N/A:N/A] Weeks of Treatment: [5:Open] [N/A:N/A] Wound Status: [5:No] [N/A:N/A] Wound Recurrence: [5:0.3x1x0.1] [N/A:N/A] Measurements L x  W x D (cm) [5:0.236] [N/A:N/A] A (cm) : rea [5:0.024] [N/A:N/A] Volume (cm) : [5:96.50%] [N/A:N/A] % Reduction in Area: [5:96.40%] [N/A:N/A] % Reduction in Volume: [5:Full Thickness Without Exposed] [N/A:N/A] Classification: [5:Support Structures Medium] [N/A:N/A] Exudate A mount: [5:Serosanguineous] [N/A:N/A] Exudate Type: [5:red, brown] [N/A:N/A] Exudate Color: [5:Distinct, outline attached] [N/A:N/A] Wound Margin: [5:Large (67-100%)] [N/A:N/A] Granulation A mount: [5:Red] [N/A:N/A] Granulation Quality: [5:Small (1-33%)] [N/A:N/A] Necrotic A mount: [5:Eschar, Adherent Slough] [N/A:N/A] Necrotic Tissue: [5:Fat Layer (Subcutaneous Tissue): Yes N/A] Exposed Structures: [5:Fascia: No Tendon: No Muscle: No Joint: No Bone: No Medium (34-66%)] [N/A:N/A] Epithelialization: [5:Debridement - Selective/Open Wound N/A] Debridement: Pre-procedure Verification/Time Out 07:52 [N/A:N/A] Taken: [5:Lidocaine 4% Topical Solution] [N/A:N/A] Pain Control: [5:Necrotic/Eschar, Slough] [N/A:N/A] Tissue Debrided: [5:Non-Viable Tissue] [N/A:N/A] Level: [5:0.3] [N/A:N/A] Debridement A (sq  cm): [5:rea Curette] [N/A:N/A] Instrument: [5:Minimum] [N/A:N/A] Bleeding: [5:Pressure] [N/A:N/A] Hemostasis A chieved: [5:0] [N/A:N/A] Procedural Pain: [5:0] [N/A:N/A] Post Procedural Pain: [5:Procedure was tolerated well] [N/A:N/A] Debridement Treatment Response: [5:0.3x1x0.1] [N/A:N/A] Post Debridement Measurements L x W x D (cm) [5:0.024] [N/A:N/A] Post Debridement Volume: (cm) [5:Debridement] [N/A:N/A] Treatment Notes Electronic Signature(s) Signed: 07/24/2022 7:56:03 AM By: Fredirick Maudlin MD FACS Entered By: Fredirick Maudlin on 07/24/2022 07:56:03 -------------------------------------------------------------------------------- Multi-Disciplinary Care Plan Details Patient Name: Date of Service: [name], Marcus [name] 07/24/2022 7:45 A M Medical Record Number: [id number] Patient Account Number: [id number] Date of Birth/Sex: Treating RN: 11-19-1951 (71 y.o. [name] Primary Care Railynn Ballo: [name] Other Clinician: Referring Marina Boerner: Treating Banks Chaikin/Extender: [name] Weeks in Treatment: 14 Active Inactive Wound/Skin Impairment Nursing Diagnoses: Impaired tissue integrity Goals: Patient/caregiver will verbalize understanding of skin care regimen Date Initiated: 04/17/2022 Target Resolution Date: 08/25/2022 Goal Status: Active Interventions: Assess ulceration(s) every visit Treatment Activities: Skin care regimen initiated : 04/17/2022 Notes: Electronic Signature(s) Signed: 07/24/2022 4:29:27 PM By: Adline Peals Entered By: Adline Peals on 07/24/2022 07:48:56 -------------------------------------------------------------------------------- Pain Assessment Details Patient Name: Date of Service: Marcus Milliner T. 07/24/2022 7:45 A M Medical Record Number: [id number] Patient Account Number: [id number] Date of Birth/Sex: Treating RN: 1951-07-30 (71 y.o. [name] Primary Care Dianara Smullen: [name] Other Clinician: Referring Bowie Delia: Treating Braedyn Riggle/Extender: Casandra Doffing in Treatment: 14 Active Problems Location of Pain Severity and Description of Pain Patient Has Paino No Site Locations Rate the pain. Current Pain Level: 0 Pain Management and Medication Current Pain Management: Electronic Signature(s) Signed: 07/24/2022 4:29:27 PM By: Adline Peals Entered By: Adline Peals on 07/24/2022 07:42:11 -------------------------------------------------------------------------------- Patient/Caregiver Education Details Patient Name: Date of Service: [name] 7/31/2023andnbsp7:45 A M Medical Record Number: [id number] Patient Account Number: [id number] Date of Birth/Gender: Treating RN: 18-May-1951 (71 y.o. [name] Primary Care Physician: [name] Other Clinician: Referring Physician: Treating Physician/Extender: Casandra Doffing in Treatment: 14 Education Assessment Education Provided To: Patient Education Topics Provided Wound/Skin Impairment: Methods: Explain/Verbal Responses: Reinforcements needed, State content correctly Electronic Signature(s) Signed: 07/24/2022 4:29:27 PM By: Adline Peals Entered By: Adline Peals on 07/24/2022 07:49:09 -------------------------------------------------------------------------------- Wound Assessment Details Patient Name: Date of Service: Marcus Milliner T. 07/24/2022 7:45 A M Medical Record Number: [id number] Patient Account Number: [id number] Date of Birth/Sex: Treating RN: 1950-12-30 (71 y.o. [name] Primary Care Disaya Walt: [name] Other Clinician: Referring Greysyn Vanderberg: Treating Clerence Gubser/Extender: [name] Weeks in Treatment: 14 Wound Status Wound Number: 5 Primary Trauma, Other Etiology: Wound Location: Right Lower Leg Wound Open Wounding Event: Trauma Status: Date  Acquired: 06/05/2022 Comorbid Cataracts, Arrhythmia, Coronary Artery Disease,  Hypertension, Weeks Of Treatment: 5 History: Myocardial Infarction, Peripheral Arterial Disease, Peripheral Venous Clustered Wound: No Disease, Type II Diabetes, Osteoarthritis, Neuropathy, Received Radiation Wound Measurements Length: (cm) 0.3 Width: (cm) 1 Depth: (cm) 0.1 Area: (cm) 0.236 Volume: (cm) 0.024 % Reduction in Area: 96.5% % Reduction in Volume: 96.4% Epithelialization: Medium (34-66%) Tunneling: No Undermining: No Wound Description Classification: Full Thickness Without Exposed Support Structures Wound Margin: Distinct, outline attached Exudate Amount: Medium Exudate Type: Serosanguineous Exudate Color: red, brown Foul Odor After Cleansing: No Slough/Fibrino Yes Wound Bed Granulation Amount: Large (67-100%) Exposed Structure Granulation Quality: Red Fascia Exposed: No Necrotic Amount: Small (1-33%) Fat Layer (Subcutaneous Tissue) Exposed: Yes Necrotic Quality: Eschar, Adherent Slough Tendon Exposed: No Muscle Exposed: No Joint Exposed: No Bone Exposed: No Treatment Notes Wound #5 (Lower Leg) Wound Laterality: Right Cleanser Soap and Water Discharge Instruction: May shower and wash wound with dial antibacterial soap and water prior to dressing change. Wound Cleanser Discharge Instruction: Cleanse the wound with wound cleanser prior to applying a clean dressing using gauze sponges, not tissue or cotton balls. Peri-Wound Care Triamcinolone 15 (g) Discharge Instruction: Use triamcinolone 15 (g) as directed Sween Lotion (Moisturizing lotion) Discharge Instruction: Apply moisturizing lotion as directed Topical Mupirocin Ointment Discharge Instruction: Apply Mupirocin (Bactroban) as instructed Primary Dressing KerraCel Ag Gelling Fiber Dressing, 2x2 in (silver alginate) Discharge Instruction: Apply silver alginate to wound bed as instructed Secondary Dressing Woven Gauze  Sponge, Non-Sterile 4x4 in Discharge Instruction: Apply over primary dressing as directed. Secured With [employer] Surgical T 2x10 (in/yd) ape Discharge Instruction: Secure with tape as directed. Compression Wrap Kerlix Roll 4.5x3.1 (in/yd) Discharge Instruction: Apply Kerlix and Coban compression as directed. Coban Self-Adherent Wrap 4x5 (in/yd) Discharge Instruction: Apply over Kerlix as directed. Compression Stockings Add-Ons Electronic Signature(s) Signed: 07/24/2022 4:29:27 PM By: Adline Peals Entered By: Adline Peals on 07/24/2022 07:48:42 -------------------------------------------------------------------------------- Vitals Details Patient Name: Date of Service: [name], Marcus [name] 07/24/2022 7:45 A M Medical Record Number: [id number] Patient Account Number: [id number] Date of Birth/Sex: Treating RN: 09-Nov-1951 (71 y.o. [name] Primary Care Jaimya Feliciano: [name] Other Clinician: Referring Archit Leger: Treating Blayden Conwell/Extender: Casandra Doffing in Treatment: 14 Vital Signs Time Taken: 07:42 Temperature (F): 98.6 Height (in): 70 Pulse (bpm): 62 Weight (lbs): 219 Respiratory Rate (breaths/min): 18 Body Mass Index (BMI): 31.4 Blood Pressure (mmHg): 129/76 Reference Range: 80 - 120 mg / dl Electronic Signature(s) Signed: 07/24/2022 4:29:27 PM By: Adline Peals Entered By: Adline Peals on 07/24/2022 07:42:46

## 2022-07-24 NOTE — Progress Notes (Signed)
EMRAH, ARIOLA ([id number]) . Visit Report for 07/24/2022 Chief Complaint Document Details Patient Name: Date of Service: [name]. 07/24/2022 7:45 A M Medical Record Number: [id number] Patient Account Number: [id number] Date of Birth/Sex: Treating RN: March 24, 1951 (71 y.o. M) Primary Care Provider: [name] Other Clinician: Referring Provider: Treating Provider/Extender: Casandra Doffing in Treatment: 14 Information Obtained from: Patient Chief Complaint Patient seen for complaints of Non-Healing Wounds. Electronic Signature(s) Signed: 07/24/2022 7:56:10 AM By: Fredirick Maudlin MD FACS Entered By: Fredirick Maudlin on 07/24/2022 07:56:10 -------------------------------------------------------------------------------- Debridement Details Patient Name: Date of Service: [name], Marcus [name] 07/24/2022 7:45 A M Medical Record Number: [id number] Patient Account Number: [id number] Date of Birth/Sex: Treating RN: 18-Sep-1951 (71 y.o. [name] Primary Care Provider: [name] Other Clinician: Referring Provider: Treating Provider/Extender: Casandra Doffing in Treatment: 14 Debridement Performed for Assessment: Wound #5 Right Lower Leg Performed By: Physician Fredirick Maudlin, MD Debridement Type: Debridement Level of Consciousness (Pre-procedure): Awake and Alert Pre-procedure Verification/Time Out Yes - 07:52 Taken: Start Time: 07:52 Pain Control: Lidocaine 4% T opical Solution T Area Debrided (L x W): otal 0.3 (cm) x 1 (cm) = 0.3 (cm) Tissue and other material debrided: Non-Viable, Eschar, Slough, Slough Level: Non-Viable Tissue Debridement Description: Selective/Open Wound Instrument: Curette Bleeding: Minimum Hemostasis Achieved: Pressure Procedural Pain: 0 Post Procedural Pain: 0 Response to Treatment: Procedure was tolerated well Level of Consciousness (Post- Awake and Alert procedure): Post  Debridement Measurements of Total Wound Length: (cm) 0.3 Width: (cm) 1 Depth: (cm) 0.1 Volume: (cm) 0.024 Character of Wound/Ulcer Post Debridement: Improved Post Procedure Diagnosis Same as Pre-procedure Electronic Signature(s) Signed: 07/24/2022 8:46:34 AM By: Fredirick Maudlin MD FACS Signed: 07/24/2022 4:29:27 PM By: Adline Peals Entered By: Adline Peals on 07/24/2022 07:53:26 -------------------------------------------------------------------------------- HPI Details Patient Name: Date of Service: [name], Marcus [name] 07/24/2022 7:45 A M Medical Record Number: [id number] Patient Account Number: [id number] Date of Birth/Sex: Treating RN: 01-23-1951 (71 y.o. M) Primary Care Provider: [name] Other Clinician: Referring Provider: Treating Provider/Extender: Casandra Doffing in Treatment: 14 History of Present Illness HPI Description: 11/10/2020 patient presents today for initial evaluation in this clinic although I have seen this patient in [place] previous. Subsequently his issue today is different from when I saw him for I was treating him for a toe ulcer. Currently he is having issues with bilateral lower extremity ulcers after running into a metal bar. Upon inspection today he has wounds of the bilateral lower extremities which are consistent with having struck his legs on the anterior aspect. With that being said the patient did go to the ER for evaluation on October 11 where he was given Keflex initially. He went back to the ER after not improving on October 20 he was given Lasix at that point he states the Lasix seem to help the most. Has been using Neosporin and leaving this open area which is probably not the best way to go either to be honest. He has had arterial studies on March 2021 which showed a left ABI of 0.78 with a TBI of 0.34 and a right ABI of 1.18 with a TBI 1.18. Nonetheless his arterial flow the left is not ideal but also  I think potentially consistent with allowing this to heal. I think he can also support light compression with Kerlix and Coban based on this result. His most recent hemoglobin A1c that we could find was 7.3 and that was  towards the end of 2020. The patient does have a history of heart disease unfortunately. He is also currently still a smoker. 11/24/2020 on evaluation today patient appears to be doing well with regard to his wounds. In fact both appear to be doing better after last week's rather last visit's debridement. Fortunately there is no signs of active infection at this time. No fevers, chills, nausea, vomiting, or diarrhea. Overall I am very pleased with where things stand today. 12/01/2020 on evaluation today patient appears to be doing well with regard to his leg ulcers. He has been tolerating the dressing changes without complication. Fortunately there is no signs of active infection at this time. No fevers, chills, nausea, vomiting, or diarrhea. 12/08/2020 on evaluation today patient appears to be doing very well in regard to his bilateral lower extremity ulcers. They seem to be doing excellent and he is making great progress. There does not appear to be any evidence of active infection at this time. No fevers, chills, nausea, vomiting, or diarrhea. 12/15/2020 upon evaluation today patient actually appears to be doing excellent in regard to his wounds. Has been tolerating the dressing changes without complication. Fortunately his wounds are showing signs of great improvement were using Hydrofera Blue. 12/29/2020 on evaluation today patient appears to be doing well with regard to his right leg which is completely healed. His left leg is also measuring smaller than not healed this is doing very well. Fortunately there is no signs of active infection at this time 01/05/2021 upon evaluation today patient actually appears to be making signs of improvement with regard to his left leg. I am very  pleased with how things are progressing. There is no evidence of active infection at this time. No fevers, chills, nausea, vomiting, or diarrhea. Patient is extremely happy with the fact that this is doing so well 01/12/2021 upon evaluation today patient appears to be doing excellent in regard to his leg ulcer. Fortunately there is no signs of active infection at this time. No fevers, chills, nausea, vomiting, or diarrhea. He has been tolerating the dressing changes without complication. 01/19/2021 upon evaluation today patient appears to be doing well with regard to his wound. In fact this appears to be completely healed on the left leg. This is brand-new skin but overall seems to be doing quite well which is great news. No fevers, chills, nausea, vomiting, or diarrhea. READMISSION 04/17/2022 This is a patient has been seen on a number of occasions in the wound care center. He is 71 years old and has type 2 diabetes mellitus, coronary artery disease, chronic venous insufficiency, and peripheral arterial disease with recent vascular studies demonstrating a right TBI of 0.9 (normal) and a left great toe TBI of 0.33. 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 He recently dropped an object and it hit him in the left shin. He then bumped the same leg against his wagon. He now has 2 wounds on his left anterior lower leg. He says that he applied Aquaphor to keep the areas moist and made an appointment in the wound care center for follow-up. 04/24/2022: Today, he is here with what he describes as 10 out of 10 pain in his wounds. Both of them measured a little bit larger today, but this may be secondary to the debridement that was performed last week. He says that due to it being spring season, he is on his feet quite a bit more. The leg is red and warm. We have been  using silver alginate with Kerlix and Coban wraps. 05/01/2022: PCR culture taken last week grew out MRSA. I prescribed doxycycline. He  has been taking this. Both wounds are a bit smaller today and somewhat less painful. They continue to accumulate a bit of slough. 05/08/2022: He has completed his course of oral doxycycline. We have been using mupirocin under silver alginate with Kerlix and Coban wrapping. Today, both wounds are a bit smaller but have accumulated slough on the surface. They are less tender. 05/23/2022: Both wounds are quite a bit more shallow today. There is a little bit of slough accumulation, but there is also good granulation tissue as well as some buds of epithelialized tissue coming through the wound. No surrounding erythema, induration, nor any purulent drainage. We have been using mupirocin with silver alginate and Kerlix/Coban wrapping. 05/29/2022: The more anterior wound continues to demonstrate encroaching epithelium and the remaining open portion has good granulation tissue. The more lateral wound is nearly closed with just a pinpoint opening. No concern for infection. 06/05/2022: The lateral wound has healed. The more anterior wound is much smaller today with just a little bit of surrounding eschar. The wound surface is healthy-appearing with good granulation tissue. 06/13/2022: The wound on the anterior tibial surface on the left is nearly closed. There is just a tiny opening underneath some eschar. Unfortunately, his dog jumped up on him and cut his right lower leg. There is a flap of nonviable skin with some hematoma and bruising. No surrounding erythema or induration. No purulent discharge. 06/19/2022: The wound on his left leg is closed. The right sided wound is smaller, but there is significant periwound erythema and the leg is more tender. There is slough accumulation on the wound surface. 06/26/2022: The right sided wound is about the same. It is tender but there is less periwound erythema. He still has slough on the wound surface. He has been taking Bactrim as prescribed. 07/03/2022: The PCR culture that  I took demonstrated bacteria not sensitive to Bactrim so he was prescribed levofloxacin, as supported by the culture data. He has been taking that and there has been significant improvement in his degree of pain. The wound itself is smaller with less slough accumulation. There is good granulation tissue emerging. 07/10/2022: The wound has contracted substantially and there is a lot of epithelialized tissue budding through. There is a small amount of slough on the surface. No concern for infection. 07/17/2022: The wound continues to contract and is down to just a fairly small open lesion. There is slough on the wound surface but underneath, the granulation tissue is healthy. No concern for infection. 07/24/2022: The wound is down to just a small slit. There is just a light layer of slough and some perimeter eschar present. Electronic Signature(s) Signed: 07/24/2022 7:56:35 AM By: Fredirick Maudlin MD FACS Entered By: Fredirick Maudlin on 07/24/2022 07:56:35 -------------------------------------------------------------------------------- Physical Exam Details Patient Name: Date of Service: Marcus Milliner T. 07/24/2022 7:45 A M Medical Record Number: [id number] Patient Account Number: [id number] Date of Birth/Sex: Treating RN: 1951-12-05 (71 y.o. M) Primary Care Provider: [name] Other Clinician: Referring Provider: Treating Provider/Extender: [name] Weeks in Treatment: 14 Constitutional . . . . No acute distress.Marland Kitchen Respiratory Normal work of breathing on room air.. Notes 07/24/2022: The wound is down to just a small slit. There is just a light layer of slough and some perimeter eschar present. Electronic Signature(s) Signed: 07/24/2022 7:57:03 AM By: Fredirick Maudlin MD FACS Entered By:  Fredirick Maudlin on 07/24/2022 07:57:03 -------------------------------------------------------------------------------- Physician Orders Details Patient Name: Date of  Service: [name]. 07/24/2022 7:45 A M Medical Record Number: [id number] Patient Account Number: [id number] Date of Birth/Sex: Treating RN: April 23, 1951 (71 y.o. [name] Primary Care Provider: [name] Other Clinician: Referring Provider: Treating Provider/Extender: Casandra Doffing in Treatment: [phone] Verbal / Phone Orders: No Diagnosis Coding Follow-up Appointments ppointment in 1 week. - Dr Celine Ahr - Room 2 - 8/7 at 8:30 AM Return A Bathing/ Shower/ Hygiene May shower with protection but do not get wound dressing(s) wet. - Please do not get Left Leg wet/damp. Edema Control - Lymphedema / SCD / Other Bilateral Lower Extremities Avoid standing for long periods of time. Exercise regularly Moisturize legs daily. Wound Treatment Wound #5 - Lower Leg Wound Laterality: Right Cleanser: Soap and Water 1 x Per Week/30 Days Discharge Instructions: May shower and wash wound with dial antibacterial soap and water prior to dressing change. Cleanser: Wound Cleanser 1 x Per Week/30 Days Discharge Instructions: Cleanse the wound with wound cleanser prior to applying a clean dressing using gauze sponges, not tissue or cotton balls. Peri-Wound Care: Triamcinolone 15 (g) 1 x Per Week/30 Days Discharge Instructions: Use triamcinolone 15 (g) as directed Peri-Wound Care: Sween Lotion (Moisturizing lotion) 1 x Per Week/30 Days Discharge Instructions: Apply moisturizing lotion as directed Topical: Mupirocin Ointment 1 x Per Week/30 Days Discharge Instructions: Apply Mupirocin (Bactroban) as instructed Prim Dressing: KerraCel Ag Gelling Fiber Dressing, 2x2 in (silver alginate) 1 x Per Week/30 Days ary Discharge Instructions: Apply silver alginate to wound bed as instructed Secondary Dressing: Woven Gauze Sponge, Non-Sterile 4x4 in 1 x Per Week/30 Days Discharge Instructions: Apply over primary dressing as directed. Secured With: 31M Medipore Public affairs consultant  Surgical T 2x10 (in/yd) 1 x Per Week/30 Days ape Discharge Instructions: Secure with tape as directed. Compression Wrap: Kerlix Roll 4.5x3.1 (in/yd) 1 x Per Week/30 Days Discharge Instructions: Apply Kerlix and Coban compression as directed. Compression Wrap: Coban Self-Adherent Wrap 4x5 (in/yd) 1 x Per Week/30 Days Discharge Instructions: Apply over Kerlix as directed. Electronic Signature(s) Signed: 07/24/2022 8:46:34 AM By: Fredirick Maudlin MD FACS Signed: 07/24/2022 4:29:27 PM By: Adline Peals Previous Signature: 07/24/2022 7:57:13 AM Version By: Fredirick Maudlin MD FACS Entered By: Adline Peals on 07/24/2022 08:02:56 -------------------------------------------------------------------------------- Problem List Details Patient Name: Date of Service: [name], Marcus [name] 07/24/2022 7:45 A M Medical Record Number: [id number] Patient Account Number: [id number] Date of Birth/Sex: Treating RN: 1951/10/03 (71 y.o. 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 07/24/2022 7:45 A M Medical Record Number: [id number] Patient Account Number: [id number] Date of Birth/Sex: Treating RN: 30-Jan-1951 (71 y.o. M) Primary Care Provider: [name] Other Clinician: Referring  Provider: Treating Provider/Extender: Casandra Doffing in Treatment: 14 Subjective Chief Complaint Information obtained from Patient Patient seen for complaints of Non-Healing Wounds. History of Present Illness (HPI) 11/10/2020 patient presents today for initial evaluation in this clinic although I have seen this patient in [place] previous. Subsequently his issue today is different from when I saw him for I was treating him for a toe ulcer. Currently he is having issues with bilateral lower extremity ulcers after running into a metal bar. Upon inspection today he has wounds of the bilateral lower extremities which are consistent with having struck his legs on the anterior aspect. With that being said the patient did go to the ER for evaluation on October 11 where he was given Keflex initially. He went back to the ER after not improving on October 20 he was given Lasix at that point he states the Lasix seem to help the most. Has been using Neosporin and leaving this open area which is probably not the best way to go either to be honest. He has had arterial studies on March 2021 which showed a left ABI of 0.78 with a TBI of 0.34 and a right ABI of 1.18 with a TBI 1.18. Nonetheless his arterial flow the left is not ideal but also I think potentially consistent with allowing this to heal. I think he can also support light compression with Kerlix and Coban based on this result. His most recent hemoglobin A1c that we could find was 7.3 and that was towards the end of 2020. The patient does have a history of heart disease unfortunately. He is also currently still a smoker. 11/24/2020 on evaluation today  patient appears to be doing well with regard to his wounds. In fact both appear to be doing better after last week's rather last visit's debridement. Fortunately there is no signs of active infection at this time. No fevers, chills, nausea, vomiting, or diarrhea. Overall I am very pleased with where things stand today. 12/01/2020 on evaluation today patient appears to be doing well with regard to his leg ulcers. He has been tolerating the dressing changes without complication. Fortunately there is no signs of active infection at this time. No fevers, chills, nausea, vomiting, or diarrhea. 12/08/2020 on evaluation today patient appears to be doing very well in regard to his bilateral lower extremity ulcers. They seem to be doing excellent and he is making great progress. There does not appear to be any evidence of active infection at this time. No fevers, chills, nausea, vomiting, or diarrhea. 12/15/2020 upon evaluation today patient actually appears to be doing excellent in regard to his wounds. Has been tolerating the dressing changes without complication. Fortunately his wounds are showing signs of great improvement were using Hydrofera Blue. 12/29/2020 on evaluation today patient appears to be doing well with regard to his right leg which is completely healed. His left leg is also measuring smaller than not healed this is doing very well. Fortunately there is no signs of active infection at this time 01/05/2021 upon evaluation today patient actually appears to be making signs of improvement with regard to his left leg. I am very pleased with how things are progressing. There is no evidence of active infection at this time. No fevers, chills, nausea, vomiting, or diarrhea. Patient is extremely happy with the fact that this is doing so well 01/12/2021 upon evaluation today patient appears to be doing excellent in regard to his leg ulcer. Fortunately there is no signs of active  infection at this time. No  fevers, chills, nausea, vomiting, or diarrhea. He has been tolerating the dressing changes without complication. 01/19/2021 upon evaluation today patient appears to be doing well with regard to his wound. In fact this appears to be completely healed on the left leg. This is brand-new skin but overall seems to be doing quite well which is great news. No fevers, chills, nausea, vomiting, or diarrhea. READMISSION 04/17/2022 This is a patient has been seen on a number of occasions in the wound care center. He is 71 years old and has type 2 diabetes mellitus, coronary artery disease, chronic venous insufficiency, and peripheral arterial disease with recent vascular studies demonstrating a right TBI of 0.9 (normal) and a left great toe TBI of 0.33. 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 He recently dropped an object and it hit him in the left shin. He then bumped the same leg against his wagon. He now has 2 wounds on his left anterior lower leg. He says that he applied Aquaphor to keep the areas moist and made an appointment in the wound care center for follow-up. 04/24/2022: Today, he is here with what he describes as 10 out of 10 pain in his wounds. Both of them measured a little bit larger today, but this may be secondary to the debridement that was performed last week. He says that due to it being spring season, he is on his feet quite a bit more. The leg is red and warm. We have been using silver alginate with Kerlix and Coban wraps. 05/01/2022: PCR culture taken last week grew out MRSA. I prescribed doxycycline. He has been taking this. Both wounds are a bit smaller today and somewhat less painful. They continue to accumulate a bit of slough. 05/08/2022: He has completed his course of oral doxycycline. We have been using mupirocin under silver alginate with Kerlix and Coban wrapping. Today, both wounds are a bit smaller but have accumulated slough on the surface. They are less  tender. 05/23/2022: Both wounds are quite a bit more shallow today. There is a little bit of slough accumulation, but there is also good granulation tissue as well as some buds of epithelialized tissue coming through the wound. No surrounding erythema, induration, nor any purulent drainage. We have been using mupirocin with silver alginate and Kerlix/Coban wrapping. 05/29/2022: The more anterior wound continues to demonstrate encroaching epithelium and the remaining open portion has good granulation tissue. The more lateral wound is nearly closed with just a pinpoint opening. No concern for infection. 06/05/2022: The lateral wound has healed. The more anterior wound is much smaller today with just a little bit of surrounding eschar. The wound surface is healthy-appearing with good granulation tissue. 06/13/2022: The wound on the anterior tibial surface on the left is nearly closed. There is just a tiny opening underneath some eschar. Unfortunately, his dog jumped up on him and cut his right lower leg. There is a flap of nonviable skin with some hematoma and bruising. No surrounding erythema or induration. No purulent discharge. 06/19/2022: The wound on his left leg is closed. The right sided wound is smaller, but there is significant periwound erythema and the leg is more tender. There is slough accumulation on the wound surface. 06/26/2022: The right sided wound is about the same. It is tender but there is less periwound erythema. He still has slough on the wound surface. He has been taking Bactrim as prescribed. 07/03/2022: The PCR culture that I took demonstrated  bacteria not sensitive to Bactrim so he was prescribed levofloxacin, as supported by the culture data. He has been taking that and there has been significant improvement in his degree of pain. The wound itself is smaller with less slough accumulation. There is good granulation tissue emerging. 07/10/2022: The wound has contracted substantially  and there is a lot of epithelialized tissue budding through. There is a small amount of slough on the surface. No concern for infection. 07/17/2022: The wound continues to contract and is down to just a fairly small open lesion. There is slough on the wound surface but underneath, the granulation tissue is healthy. No concern for infection. 07/24/2022: The wound is down to just a small slit. There is just a light layer of slough and some perimeter eschar present. Patient History Information obtained from Patient. Social History Current every day smoker - 1 PPD, Marital Status - Married, Alcohol Use - Rarely, Drug Use - No History, Caffeine Use - Daily - coffee, soda. Medical History Eyes Patient has history of Cataracts - both eyes Cardiovascular Patient has history of Arrhythmia - A-fib, Coronary Artery Disease, Hypertension, Myocardial Infarction - CABG x 4, Peripheral Arterial Disease, Peripheral Venous Disease Endocrine Patient has history of Type II Diabetes Musculoskeletal Patient has history of Osteoarthritis Neurologic Patient has history of Neuropathy Oncologic Patient has history of Received Radiation Medical A Surgical History Notes nd Cardiovascular Hyperlipidemia, CVA, AAA Oncologic Prostate cancer Objective Constitutional No acute distress.. Vitals Time Taken: 7:42 AM, Height: 70 in, Weight: 219 lbs, BMI: 31.4, Temperature: 98.6 F, Pulse: 62 bpm, Respiratory Rate: 18 breaths/min, Blood Pressure: 129/76 mmHg. Respiratory Normal work of breathing on room air.. General Notes: 07/24/2022: The wound is down to just a small slit. There is just a light layer of slough and some perimeter eschar present. Integumentary (Hair, Skin) Wound #5 status is Open. Original cause of wound was Trauma. The date acquired was: 06/05/2022. The wound has been in treatment 5 weeks. The wound is located on the Right Lower Leg. The wound measures 0.3cm length x 1cm width x 0.1cm depth;  0.236cm^2 area and 0.024cm^3 volume. There is Fat Layer (Subcutaneous Tissue) exposed. There is no tunneling or undermining noted. There is a medium amount of serosanguineous drainage noted. The wound margin is distinct with the outline attached to the wound base. There is large (67-100%) red granulation within the wound bed. There is a small (1-33%) amount of necrotic tissue within the wound bed including Eschar and Adherent Slough. Assessment Active Problems ICD-10 Type 2 diabetes mellitus with other skin ulcer Peripheral vascular disease, unspecified Venous insufficiency (chronic) (peripheral) Non-pressure chronic ulcer of other part of right lower leg with fat layer exposed Procedures Wound #5 Pre-procedure diagnosis of Wound #5 is a Trauma, Other located on the Right Lower Leg . There was a Selective/Open Wound Non-Viable Tissue Debridement with a total area of 0.3 sq cm performed by Fredirick Maudlin, MD. With the following instrument(s): Curette to remove Non-Viable tissue/material. Material removed includes Eschar and Slough and after achieving pain control using Lidocaine 4% T opical Solution. No specimens were taken. A time out was conducted at 07:52, prior to the start of the procedure. A Minimum amount of bleeding was controlled with Pressure. The procedure was tolerated well with a pain level of 0 throughout and a pain level of 0 following the procedure. Post Debridement Measurements: 0.3cm length x 1cm width x 0.1cm depth; 0.024cm^3 volume. Character of Wound/Ulcer Post Debridement is improved. Post procedure Diagnosis Wound #5:  Same as Pre-Procedure Plan 07/24/2022: The wound is down to just a small slit. There is just a light layer of slough and some perimeter eschar present. I used a curette to debride the slough and eschar. We will continue using the topical mupirocin ointment with silver alginate and Kerlix and Coban compression. He will follow-up in 1 week. Electronic  Signature(s) Signed: 07/24/2022 7:57:34 AM By: Fredirick Maudlin MD FACS Entered By: Fredirick Maudlin on 07/24/2022 07:57:34 -------------------------------------------------------------------------------- HxROS Details Patient Name: Date of Service: [name], Marcus [name] 07/24/2022 7:45 A M Medical Record Number: [id number] Patient Account Number: [id number] Date of Birth/Sex: Treating RN: 04-Apr-1951 (71 y.o. M) Primary Care Provider: [name] Other Clinician: Referring Provider: Treating Provider/Extender: Casandra Doffing in Treatment: 14 Information Obtained From Patient Eyes Medical History: Positive for: Cataracts - both eyes Cardiovascular Medical History: Positive for: Arrhythmia - A-fib; Coronary Artery Disease; Hypertension; Myocardial Infarction - CABG x 4; Peripheral Arterial Disease; Peripheral Venous Disease Past Medical History Notes: Hyperlipidemia, CVA, AAA Endocrine Medical History: Positive for: Type II Diabetes Time with diabetes: over 5 years Treated with: Oral agents Blood sugar tested every day: No Musculoskeletal Medical History: Positive for: Osteoarthritis Neurologic Medical History: Positive for: Neuropathy Oncologic Medical History: Positive for: Received Radiation Past Medical History Notes: Prostate cancer HBO Extended History Items Eyes: Cataracts Immunizations Pneumococcal Vaccine: Received Pneumococcal Vaccination: Yes Received Pneumococcal Vaccination On or After 60th Birthday: Yes Implantable Devices None Family and Social History Current every day smoker - 1 PPD; Marital Status - Married; Alcohol Use: Rarely; Drug Use: No History; Caffeine Use: Daily - coffee, soda; Financial Concerns: No; Food, Clothing or Shelter Needs: No; Support System Lacking: No; Transportation Concerns: No Electronic Signature(s) Signed: 07/24/2022 8:46:34 AM By: Fredirick Maudlin MD FACS Entered By: Fredirick Maudlin on  07/24/2022 07:56:41 -------------------------------------------------------------------------------- SuperBill Details Patient Name: Date of Service: [name], Marcus [name] 07/24/2022 Medical Record Number: [id number] Patient Account Number: [id number] Date of Birth/Sex: Treating RN: May 26, 1951 (71 y.o. M) Primary Care Provider: [name] Other Clinician: Referring Provider: Treating Provider/Extender: [name] Weeks in Treatment: 14 Diagnosis Coding ICD-10 Codes Code Description E11.622 Type 2 diabetes mellitus with other skin ulcer I73.9 Peripheral vascular disease, unspecified I87.2 Venous insufficiency (chronic) (peripheral) L97.812 Non-pressure chronic ulcer of other part of right lower leg with fat layer exposed Facility Procedures CPT4 Code: [id number] Description: [phone] - DEBRIDE WOUND 1ST 20 SQ CM OR < ICD-10 Diagnosis Description L97.812 Non-pressure chronic ulcer of other part of right lower leg with fat layer expos Modifier: ed Quantity: 1 Physician Procedures : CPT4 Code Description Modifier [id number] - WC PHYS LEVEL 4 - EST PT 25 ICD-10 Diagnosis Description L97.812 Non-pressure chronic ulcer of other part of right lower leg with fat layer exposed I73.9 Peripheral vascular disease, unspecified I87.2  Venous insufficiency (chronic) (peripheral) E11.622 Type 2 diabetes mellitus with other skin ulcer Quantity: 1 : [id number] - WC PHYS DEBR WO ANESTH 20 SQ CM ICD-10 Diagnosis Description T70.177 Non-pressure chronic ulcer of other part of right lower leg with fat layer exposed Quantity: 1 Electronic Signature(s) Signed: 07/24/2022 7:57:52 AM By: Fredirick Maudlin MD FACS Entered By: Fredirick Maudlin on 07/24/2022 07:57:52

## 2022-07-26 LAB — HM DIABETES EYE EXAM

## 2022-07-27 ENCOUNTER — Telehealth: Payer: Self-pay | Admitting: Cardiovascular Disease

## 2022-07-27 NOTE — Telephone Encounter (Signed)
Returned the call to the patient. He has been advised that we do not have any samples. We have applied for assistance but he needs proof of out of income. He stated that his daughter has the information and will take care of it. He will pick up the eliquis at the pharmacy.

## 2022-07-27 NOTE — Telephone Encounter (Signed)
Patient calling the office for samples of medication:   1.  What medication and dosage are you requesting samples for? apixaban (ELIQUIS) 5 MG TABS tablet  2.  Are you currently out of this medication? Yes    

## 2022-07-31 ENCOUNTER — Encounter (HOSPITAL_BASED_OUTPATIENT_CLINIC_OR_DEPARTMENT_OTHER): Payer: PPO | Attending: General Surgery | Admitting: General Surgery

## 2022-07-31 DIAGNOSIS — E1151 Type 2 diabetes mellitus with diabetic peripheral angiopathy without gangrene: Secondary | ICD-10-CM | POA: Diagnosis not present

## 2022-07-31 DIAGNOSIS — I872 Venous insufficiency (chronic) (peripheral): Secondary | ICD-10-CM | POA: Insufficient documentation

## 2022-07-31 DIAGNOSIS — E11622 Type 2 diabetes mellitus with other skin ulcer: Secondary | ICD-10-CM | POA: Insufficient documentation

## 2022-07-31 DIAGNOSIS — L97812 Non-pressure chronic ulcer of other part of right lower leg with fat layer exposed: Secondary | ICD-10-CM | POA: Insufficient documentation

## 2022-07-31 NOTE — Progress Notes (Signed)
TORE, CARREKER ([id number]) . Visit Report for 07/31/2022 Arrival Information Details Patient Name: Date of Service: [name]. 07/31/2022 8:30 A M Medical Record Number: [id number] Patient Account Number: [id number] Date of Birth/Sex: Treating RN: 1951/08/05 (71 y.o. [name] Primary Care Joeziah Voit: [name] Other Clinician: Referring Romyn Boswell: Treating Neriyah Cercone/Extender: Casandra Doffing in Treatment: 15 Visit Information History Since Last Visit Added or deleted any medications: No Patient Arrived: Ambulatory Any new allergies or adverse reactions: No Arrival Time: 08:38 Had a fall or experienced change in No Accompanied By: self activities of daily living that may affect Transfer Assistance: None risk of falls: Patient Identification Verified: Yes Signs or symptoms of abuse/neglect since last visito No Secondary Verification Process Completed: Yes Hospitalized since last visit: No Patient Requires Transmission-Based Precautions: No Implantable device outside of the clinic excluding No Patient Has Alerts: Yes cellular tissue based products placed in the center Patient Alerts: Patient on Blood Thinner since last visit: ABI R 1.11 Has Dressing in Place as Prescribed: Yes ABI L 0.72 Has Compression in Place as Prescribed: Yes TBI L 0.33 Pain Present Now: No Electronic Signature(s) Signed: 07/31/2022 4:26:03 PM By: Adline Peals Entered By: Adline Peals on 07/31/2022 08:40:31 -------------------------------------------------------------------------------- Encounter Discharge Information Details Patient Name: Date of Service: [name], Marcus [name] 07/31/2022 8:30 A M Medical Record Number: [id number] Patient Account Number: [id number] Date of Birth/Sex: Treating RN: [birth year] (71 y.o. [name] Primary Care Prajna Vanderpool: [name] Other Clinician: Referring Braeley Buskey: Treating Juri Dinning/Extender: Casandra Doffing in Treatment: 15 Encounter Discharge Information Items Post Procedure Vitals Discharge Condition: Stable Temperature (F): 98.4 Ambulatory Status: Ambulatory Pulse (bpm): 71 Discharge Destination: Home Respiratory Rate (breaths/min): 18 Transportation: Private Auto Blood Pressure (mmHg): 113/66 Accompanied By: self Schedule Follow-up Appointment: Yes Clinical Summary of Care: Patient Declined Electronic Signature(s) Signed: 07/31/2022 4:26:03 PM By: Adline Peals Entered By: Adline Peals on 07/31/2022 09:14:54 -------------------------------------------------------------------------------- Lower Extremity Assessment Details Patient Name: Date of Service: Marcus Milliner T. 07/31/2022 8:30 A M Medical Record Number: [id number] Patient Account Number: [id number] Date of Birth/Sex: Treating RN: 1951-12-16 (71 y.o. [name] Primary Care Laqueena Hinchey: [name] Other Clinician: Referring Anthoney Sheppard: Treating Othell Jaime/Extender: [name] Weeks in Treatment: 15 Edema Assessment Assessed: Shirlyn Goltz: No] Patrice Paradise: No] Edema: [Left: Ye] [Right: s] Calf Left: Right: Point of Measurement: 34 cm From Medial Instep 36.1 cm 38.7 cm Ankle Left: Right: Point of Measurement: 12 cm From Medial Instep 25.5 cm 25 cm Electronic Signature(s) Signed: 07/31/2022 4:26:03 PM By: Adline Peals Entered By: Adline Peals on 07/31/2022 08:46:40 -------------------------------------------------------------------------------- Multi Wound Chart Details Patient Name: Date of Service: [name], Marcus [name] 07/31/2022 8:30 A M Medical Record Number: [id number] Patient Account Number: [id number] Date of Birth/Sex: Treating RN: [birth year] (71 y.o. M) Primary Care Giang Hemme: [name] Other Clinician: Referring Faiga Stones: Treating Prateek Knipple/Extender: Casandra Doffing in Treatment: 15 Vital  Signs Height(in): 70 Pulse(bpm): 54 Weight(lbs): 219 Blood Pressure(mmHg): 113/66 Body Mass Index(BMI): 31.4 Temperature(F): 98.4 Respiratory Rate(breaths/min): 18 Photos: [N/A:N/A] Right Lower Leg N/A N/A Wound Location: Trauma N/A N/A Wounding Event: Trauma, Other N/A N/A Primary Etiology: Cataracts, Arrhythmia, Coronary N/A N/A Comorbid History: Artery Disease, Hypertension, Myocardial Infarction, Peripheral Arterial Disease, Peripheral Venous Disease, Type II Diabetes, Osteoarthritis, Neuropathy, Received Radiation 06/05/2022 N/A N/A Date Acquired: 6 N/A N/A Weeks of Treatment: Open N/A N/A Wound Status: No N/A N/A Wound Recurrence: 0.2x0.6x0.1 N/A N/A Measurements L x  W x D (cm) 0.094 N/A N/A A (cm) : rea 0.009 N/A N/A Volume (cm) : 98.60% N/A N/A % Reduction in Area: 98.70% N/A N/A % Reduction in Volume: Full Thickness Without Exposed N/A N/A Classification: Support Structures Medium N/A N/A Exudate A mount: Serosanguineous N/A N/A Exudate Type: red, brown N/A N/A Exudate Color: Distinct, outline attached N/A N/A Wound Margin: Large (67-100%) N/A N/A Granulation A mount: Red N/A N/A Granulation Quality: None Present (0%) N/A N/A Necrotic A mount: Fat Layer (Subcutaneous Tissue): Yes N/A N/A Exposed Structures: Fascia: No Tendon: No Muscle: No Joint: No Bone: No Large (67-100%) N/A N/A Epithelialization: Debridement - Selective/Open Wound N/A N/A Debridement: Pre-procedure Verification/Time Out 08:52 N/A N/A Taken: Lidocaine 5% topical ointment N/A N/A Pain Control: Necrotic/Eschar, Slough N/A N/A Tissue Debrided: Non-Viable Tissue N/A N/A Level: 0.12 N/A N/A Debridement A (sq cm): rea Curette N/A N/A Instrument: Minimum N/A N/A Bleeding: Pressure N/A N/A Hemostasis A chieved: 0 N/A N/A Procedural Pain: 0 N/A N/A Post Procedural Pain: Procedure was tolerated well N/A N/A Debridement Treatment Response: 0.2x0.6x0.1  N/A N/A Post Debridement Measurements L x W x D (cm) 0.009 N/A N/A Post Debridement Volume: (cm) Debridement N/A N/A Procedures Performed: Treatment Notes Electronic Signature(s) Signed: 07/31/2022 8:57:24 AM By: Fredirick Maudlin MD FACS Entered By: Fredirick Maudlin on 07/31/2022 08:57:24 -------------------------------------------------------------------------------- Multi-Disciplinary Care Plan Details Patient Name: Date of Service: [name], Marcus [name] 07/31/2022 8:30 A M Medical Record Number: [id number] Patient Account Number: [id number] Date of Birth/Sex: Treating RN: 09/19/1951 (71 y.o. [name] Primary Care Kemari Mares: [name] Other Clinician: Referring Michella Detjen: Treating Starlena Beil/Extender: [name] Weeks in Treatment: 15 Active Inactive Wound/Skin Impairment Nursing Diagnoses: Impaired tissue integrity Goals: Patient/caregiver will verbalize understanding of skin care regimen Date Initiated: 04/17/2022 Target Resolution Date: 08/25/2022 Goal Status: Active Interventions: Assess ulceration(s) every visit Treatment Activities: Skin care regimen initiated : 04/17/2022 Notes: Electronic Signature(s) Signed: 07/31/2022 4:26:03 PM By: Adline Peals Entered By: Adline Peals on 07/31/2022 08:49:01 -------------------------------------------------------------------------------- Pain Assessment Details Patient Name: Date of Service: Marcus Milliner T. 07/31/2022 8:30 A M Medical Record Number: [id number] Patient Account Number: [id number] Date of Birth/Sex: Treating RN: 1951/12/22 (71 y.o. [name] Primary Care Berlin Viereck: [name] Other Clinician: Referring Monty Mccarrell: Treating Guled Gahan/Extender: [name] Weeks in Treatment: 15 Active Problems Location of Pain Severity and Description of Pain Patient Has Paino No Site Locations Rate the pain. Current Pain Level: 0 Pain  Management and Medication Current Pain Management: Electronic Signature(s) Signed: 07/31/2022 4:26:03 PM By: Adline Peals Entered By: Adline Peals on 07/31/2022 08:40:50 -------------------------------------------------------------------------------- Patient/Caregiver Education Details Patient Name: Date of Service: [name] 8/7/2023andnbsp8:30 A M Medical Record Number: [id number] Patient Account Number: [id number] Date of Birth/Gender: Treating RN: [birth year] (71 y.o. [name] Primary Care Physician: [name] Other Clinician: Referring Physician: Treating Physician/Extender: Casandra Doffing in Treatment: 15 Education Assessment Education Provided To: Patient Education Topics Provided Wound/Skin Impairment: Methods: Explain/Verbal Responses: Reinforcements needed, State content correctly Electronic Signature(s) Signed: 07/31/2022 4:26:03 PM By: Adline Peals Entered By: Adline Peals on 07/31/2022 08:49:17 -------------------------------------------------------------------------------- Wound Assessment Details Patient Name: Date of Service: Marcus Milliner T. 07/31/2022 8:30 A M Medical Record Number: [id number] Patient Account Number: [id number] Date of Birth/Sex: Treating RN: April 12, 1951 (72 y.o. [name] Primary Care Shatoya Roets: [name] Other Clinician: Referring Deseree Zemaitis: Treating Troyce Gieske/Extender: [name] Weeks in Treatment: 15 Wound Status Wound Number:  5 Primary Trauma, Other Etiology: Wound Location: Right Lower Leg Wound Open Wounding Event: Trauma Status: Date Acquired: 06/05/2022 Comorbid Cataracts, Arrhythmia, Coronary Artery Disease, Hypertension, Weeks Of Treatment: 6 History: Myocardial Infarction, Peripheral Arterial Disease, Peripheral Venous Clustered Wound: No Disease, Type II Diabetes, Osteoarthritis, Neuropathy,  Received Radiation Photos Wound Measurements Length: (cm) 0.2 Width: (cm) 0.6 Depth: (cm) 0.1 Area: (cm) 0.094 Volume: (cm) 0.009 % Reduction in Area: 98.6% % Reduction in Volume: 98.7% Epithelialization: Large (67-100%) Tunneling: No Undermining: No Wound Description Classification: Full Thickness Without Exposed Support Structures Wound Margin: Distinct, outline attached Exudate Amount: Medium Exudate Type: Serosanguineous Exudate Color: red, brown Wound Bed Granulation Amount: Large (67-100%) Granulation Quality: Red Necrotic Amount: None Present (0%) Foul Odor After Cleansing: No Slough/Fibrino No Exposed Structure Fascia Exposed: No Fat Layer (Subcutaneous Tissue) Exposed: Yes Tendon Exposed: No Muscle Exposed: No Joint Exposed: No Bone Exposed: No Treatment Notes Wound #5 (Lower Leg) Wound Laterality: Right Cleanser Soap and Water Discharge Instruction: May shower and wash wound with dial antibacterial soap and water prior to dressing change. Wound Cleanser Discharge Instruction: Cleanse the wound with wound cleanser prior to applying a clean dressing using gauze sponges, not tissue or cotton balls. Peri-Wound Care Triamcinolone 15 (g) Discharge Instruction: Use triamcinolone 15 (g) as directed Sween Lotion (Moisturizing lotion) Discharge Instruction: Apply moisturizing lotion as directed Topical Mupirocin Ointment Discharge Instruction: Apply Mupirocin (Bactroban) as instructed Primary Dressing KerraCel Ag Gelling Fiber Dressing, 2x2 in (silver alginate) Discharge Instruction: Apply silver alginate to wound bed as instructed Secondary Dressing Woven Gauze Sponge, Non-Sterile 4x4 in Discharge Instruction: Apply over primary dressing as directed. Secured With [employer] Surgical T 2x10 (in/yd) ape Discharge Instruction: Secure with tape as directed. Compression Wrap Kerlix Roll 4.5x3.1 (in/yd) Discharge Instruction: Apply Kerlix and Coban  compression as directed. Coban Self-Adherent Wrap 4x5 (in/yd) Discharge Instruction: Apply over Kerlix as directed. Compression Stockings Add-Ons Electronic Signature(s) Signed: 07/31/2022 4:26:03 PM By: Adline Peals Entered By: Adline Peals on 07/31/2022 08:48:46 -------------------------------------------------------------------------------- Vitals Details Patient Name: Date of Service: [name], Marcus [name] 07/31/2022 8:30 A M Medical Record Number: [id number] Patient Account Number: [id number] Date of Birth/Sex: Treating RN: 09-Sep-1951 (71 y.o. [name] Primary Care Chanti Golubski: [name] Other Clinician: Referring Eino Whitner: Treating Tanuj Mullens/Extender: [name] Weeks in Treatment: 15 Vital Signs Time Taken: 08:40 Temperature (F): 98.4 Height (in): 70 Pulse (bpm): 71 Weight (lbs): 219 Respiratory Rate (breaths/min): 18 Body Mass Index (BMI): 31.4 Blood Pressure (mmHg): 113/66 Reference Range: 80 - 120 mg / dl Electronic Signature(s) Signed: 07/31/2022 4:26:03 PM By: Adline Peals Entered By: Adline Peals on 07/31/2022 08:41:50

## 2022-07-31 NOTE — Progress Notes (Signed)
HEYDEN, JABER ([id number]) . Visit Report for 07/31/2022 Chief Complaint Document Details Patient Name: Date of Service: [name]. 07/31/2022 8:30 A M Medical Record Number: [id number] Patient Account Number: [id number] Date of Birth/Sex: Treating RN: Nov 02, 1951 (71 y.o. M) Primary Care Provider: [name] Other Clinician: Referring Provider: Treating Provider/Extender: Casandra Doffing in Treatment: 15 Information Obtained from: Patient Chief Complaint Patient seen for complaints of Non-Healing Wounds. Electronic Signature(s) Signed: 07/31/2022 8:57:33 AM By: Fredirick Maudlin MD FACS Entered By: Fredirick Maudlin on 07/31/2022 08:57:32 -------------------------------------------------------------------------------- Debridement Details Patient Name: Date of Service: [name], [name] 07/31/2022 8:30 A M Medical Record Number: [id number] Patient Account Number: [id number] Date of Birth/Sex: Treating RN: 01-06-1951 (71 y.o. [name] Primary Care Provider: [name] Other Clinician: Referring Provider: Treating Provider/Extender: Casandra Doffing in Treatment: 15 Debridement Performed for Assessment: Wound #5 Right Lower Leg Performed By: Physician Fredirick Maudlin, MD Debridement Type: Debridement Level of Consciousness (Pre-procedure): Awake and Alert Pre-procedure Verification/Time Out Yes - 08:52 Taken: Start Time: 08:52 Pain Control: Lidocaine 5% topical ointment T Area Debrided (L x W): otal 0.2 (cm) x 0.6 (cm) = 0.12 (cm) Tissue and other material debrided: Non-Viable, Eschar, Slough, Slough Level: Non-Viable Tissue Debridement Description: Selective/Open Wound Instrument: Curette Bleeding: Minimum Hemostasis Achieved: Pressure Procedural Pain: 0 Post Procedural Pain: 0 Response to Treatment: Procedure was tolerated well Level of Consciousness (Post- Awake and Alert procedure): Post  Debridement Measurements of Total Wound Length: (cm) 0.2 Width: (cm) 0.6 Depth: (cm) 0.1 Volume: (cm) 0.009 Character of Wound/Ulcer Post Debridement: Improved Post Procedure Diagnosis Same as Pre-procedure Electronic Signature(s) Signed: 07/31/2022 11:24:36 AM By: Fredirick Maudlin MD FACS Signed: 07/31/2022 4:26:03 PM By: Adline Peals Entered By: Adline Peals on 07/31/2022 08:53:54 -------------------------------------------------------------------------------- HPI Details Patient Name: Date of Service: [name], [name] 07/31/2022 8:30 A M Medical Record Number: [id number] Patient Account Number: [id number] Date of Birth/Sex: Treating RN: 09-Dec-1951 (71 y.o. M) Primary Care Provider: [name] Other Clinician: Referring Provider: Treating Provider/Extender: Casandra Doffing in Treatment: 15 History of Present Illness HPI Description: 11/10/2020 patient presents today for initial evaluation in this clinic although I have seen this patient in [place] previous. Subsequently his issue today is different from when I saw him for I was treating him for a toe ulcer. Currently he is having issues with bilateral lower extremity ulcers after running into a metal bar. Upon inspection today he has wounds of the bilateral lower extremities which are consistent with having struck his legs on the anterior aspect. With that being said the patient did go to the ER for evaluation on October 11 where he was given Keflex initially. He went back to the ER after not improving on October 20 he was given Lasix at that point he states the Lasix seem to help the most. Has been using Neosporin and leaving this open area which is probably not the best way to go either to be honest. He has had arterial studies on March 2021 which showed a left ABI of 0.78 with a TBI of 0.34 and a right ABI of 1.18 with a TBI 1.18. Nonetheless his arterial flow the left is not ideal but also  I think potentially consistent with allowing this to heal. I think he can also support light compression with Kerlix and Coban based on this result. His most recent hemoglobin A1c that we could find was 7.3 and that was towards  the end of 2020. The patient does have a history of heart disease unfortunately. He is also currently still a smoker. 11/24/2020 on evaluation today patient appears to be doing well with regard to his wounds. In fact both appear to be doing better after last week's rather last visit's debridement. Fortunately there is no signs of active infection at this time. No fevers, chills, nausea, vomiting, or diarrhea. Overall I am very pleased with where things stand today. 12/01/2020 on evaluation today patient appears to be doing well with regard to his leg ulcers. He has been tolerating the dressing changes without complication. Fortunately there is no signs of active infection at this time. No fevers, chills, nausea, vomiting, or diarrhea. 12/08/2020 on evaluation today patient appears to be doing very well in regard to his bilateral lower extremity ulcers. They seem to be doing excellent and he is making great progress. There does not appear to be any evidence of active infection at this time. No fevers, chills, nausea, vomiting, or diarrhea. 12/15/2020 upon evaluation today patient actually appears to be doing excellent in regard to his wounds. Has been tolerating the dressing changes without complication. Fortunately his wounds are showing signs of great improvement were using Hydrofera Blue. 12/29/2020 on evaluation today patient appears to be doing well with regard to his right leg which is completely healed. His left leg is also measuring smaller than not healed this is doing very well. Fortunately there is no signs of active infection at this time 01/05/2021 upon evaluation today patient actually appears to be making signs of improvement with regard to his left leg. I am very  pleased with how things are progressing. There is no evidence of active infection at this time. No fevers, chills, nausea, vomiting, or diarrhea. Patient is extremely happy with the fact that this is doing so well 01/12/2021 upon evaluation today patient appears to be doing excellent in regard to his leg ulcer. Fortunately there is no signs of active infection at this time. No fevers, chills, nausea, vomiting, or diarrhea. He has been tolerating the dressing changes without complication. 01/19/2021 upon evaluation today patient appears to be doing well with regard to his wound. In fact this appears to be completely healed on the left leg. This is brand-new skin but overall seems to be doing quite well which is great news. No fevers, chills, nausea, vomiting, or diarrhea. READMISSION 04/17/2022 This is a patient has been seen on a number of occasions in the wound care center. He is 71 years old and has type 2 diabetes mellitus, coronary artery disease, chronic venous insufficiency, and peripheral arterial disease with recent vascular studies demonstrating a right TBI of 0.9 (normal) and a left great toe TBI of 0.33. 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 He recently dropped an object and it hit him in the left shin. He then bumped the same leg against his wagon. He now has 2 wounds on his left anterior lower leg. He says that he applied Aquaphor to keep the areas moist and made an appointment in the wound care center for follow-up. 04/24/2022: Today, he is here with what he describes as 10 out of 10 pain in his wounds. Both of them measured a little bit larger today, but this may be secondary to the debridement that was performed last week. He says that due to it being spring season, he is on his feet quite a bit more. The leg is red and warm. We have been using  silver alginate with Kerlix and Coban wraps. 05/01/2022: PCR culture taken last week grew out MRSA. I prescribed doxycycline. He  has been taking this. Both wounds are a bit smaller today and somewhat less painful. They continue to accumulate a bit of slough. 05/08/2022: He has completed his course of oral doxycycline. We have been using mupirocin under silver alginate with Kerlix and Coban wrapping. Today, both wounds are a bit smaller but have accumulated slough on the surface. They are less tender. 05/23/2022: Both wounds are quite a bit more shallow today. There is a little bit of slough accumulation, but there is also good granulation tissue as well as some buds of epithelialized tissue coming through the wound. No surrounding erythema, induration, nor any purulent drainage. We have been using mupirocin with silver alginate and Kerlix/Coban wrapping. 05/29/2022: The more anterior wound continues to demonstrate encroaching epithelium and the remaining open portion has good granulation tissue. The more lateral wound is nearly closed with just a pinpoint opening. No concern for infection. 06/05/2022: The lateral wound has healed. The more anterior wound is much smaller today with just a little bit of surrounding eschar. The wound surface is healthy-appearing with good granulation tissue. 06/13/2022: The wound on the anterior tibial surface on the left is nearly closed. There is just a tiny opening underneath some eschar. Unfortunately, his dog jumped up on him and cut his right lower leg. There is a flap of nonviable skin with some hematoma and bruising. No surrounding erythema or induration. No purulent discharge. 06/19/2022: The wound on his left leg is closed. The right sided wound is smaller, but there is significant periwound erythema and the leg is more tender. There is slough accumulation on the wound surface. 06/26/2022: The right sided wound is about the same. It is tender but there is less periwound erythema. He still has slough on the wound surface. He has been taking Bactrim as prescribed. 07/03/2022: The PCR culture that  I took demonstrated bacteria not sensitive to Bactrim so he was prescribed levofloxacin, as supported by the culture data. He has been taking that and there has been significant improvement in his degree of pain. The wound itself is smaller with less slough accumulation. There is good granulation tissue emerging. 07/10/2022: The wound has contracted substantially and there is a lot of epithelialized tissue budding through. There is a small amount of slough on the surface. No concern for infection. 07/17/2022: The wound continues to contract and is down to just a fairly small open lesion. There is slough on the wound surface but underneath, the granulation tissue is healthy. No concern for infection. 07/24/2022: The wound is down to just a small slit. There is just a light layer of slough and some perimeter eschar present. 07/31/2022: The wound continues to contract. There is just some eschar overlying the surface. The underlying granulation tissue is robust. Electronic Signature(s) Signed: 07/31/2022 9:16:38 AM By: Fredirick Maudlin MD FACS Entered By: Fredirick Maudlin on 07/31/2022 09:16:38 -------------------------------------------------------------------------------- Physical Exam Details Patient Name: Date of Service: [name], [name] 07/31/2022 8:30 A M Medical Record Number: [id number] Patient Account Number: [id number] Date of Birth/Sex: Treating RN: [birth year] (71 y.o. M) Primary Care Provider: [name] Other Clinician: Referring Provider: Treating Provider/Extender: [name] Weeks in Treatment: 15 Constitutional . . . . No acute distress.Marland Kitchen Respiratory Normal work of breathing on room air.. Notes 07/31/2022: The wound continues to contract. There is just some eschar overlying the surface. The underlying granulation  tissue is robust. Electronic Signature(s) Signed: 07/31/2022 9:27:43 AM By: Fredirick Maudlin MD FACS Entered By: Fredirick Maudlin on 07/31/2022  09:27:42 -------------------------------------------------------------------------------- Physician Orders Details Patient Name: Date of Service: [name], [name] 07/31/2022 8:30 A M Medical Record Number: [id number] Patient Account Number: [id number] Date of Birth/Sex: Treating RN: August 19, 1951 (71 y.o. [name] Primary Care Provider: [name] Other Clinician: Referring Provider: Treating Provider/Extender: Casandra Doffing in Treatment: 15 Verbal / Phone Orders: No Diagnosis Coding ICD-10 Coding Code Description E11.622 Type 2 diabetes mellitus with other skin ulcer I73.9 Peripheral vascular disease, unspecified I87.2 Venous insufficiency (chronic) (peripheral) L97.812 Non-pressure chronic ulcer of other part of right lower leg with fat layer exposed Follow-up Appointments ppointment in 1 week. - Dr Celine Ahr - Room 2 - 8/14 at 7:45 AM Return A Bathing/ Shower/ Hygiene May shower with protection but do not get wound dressing(s) wet. - Please do not get Left Leg wet/damp. Edema Control - Lymphedema / SCD / Other Bilateral Lower Extremities Avoid standing for long periods of time. Exercise regularly Moisturize legs daily. Wound Treatment Wound #5 - Lower Leg Wound Laterality: Right Cleanser: Soap and Water 1 x Per Week/30 Days Discharge Instructions: May shower and wash wound with dial antibacterial soap and water prior to dressing change. Cleanser: Wound Cleanser 1 x Per Week/30 Days Discharge Instructions: Cleanse the wound with wound cleanser prior to applying a clean dressing using gauze sponges, not tissue or cotton balls. Peri-Wound Care: Triamcinolone 15 (g) 1 x Per Week/30 Days Discharge Instructions: Use triamcinolone 15 (g) as directed Peri-Wound Care: Sween Lotion (Moisturizing lotion) 1 x Per Week/30 Days Discharge Instructions: Apply moisturizing lotion as directed Topical: Mupirocin Ointment 1 x Per Week/30 Days Discharge  Instructions: Apply Mupirocin (Bactroban) as instructed Prim Dressing: KerraCel Ag Gelling Fiber Dressing, 2x2 in (silver alginate) 1 x Per Week/30 Days ary Discharge Instructions: Apply silver alginate to wound bed as instructed Secondary Dressing: Woven Gauze Sponge, Non-Sterile 4x4 in 1 x Per Week/30 Days Discharge Instructions: Apply over primary dressing as directed. Secured With: 47M Medipore Public affairs consultant Surgical T 2x10 (in/yd) 1 x Per Week/30 Days ape Discharge Instructions: Secure with tape as directed. Compression Wrap: Kerlix Roll 4.5x3.1 (in/yd) 1 x Per Week/30 Days Discharge Instructions: Apply Kerlix and Coban compression as directed. Compression Wrap: Coban Self-Adherent Wrap 4x5 (in/yd) 1 x Per Week/30 Days Discharge Instructions: Apply over Kerlix as directed. Electronic Signature(s) Signed: 07/31/2022 11:24:36 AM By: Fredirick Maudlin MD FACS Entered By: Fredirick Maudlin on 07/31/2022 09:28:02 -------------------------------------------------------------------------------- Problem List Details Patient Name: Date of Service: [name], [name] 07/31/2022 8:30 A M Medical Record Number: [id number] Patient Account Number: [id number] Date of Birth/Sex: Treating RN: [birth year] (71 y.o. M) Primary Care Provider: [name] Other Clinician: Referring Provider: Treating Provider/Extender: Casandra Doffing in Treatment: 15 Active Problems ICD-10 Encounter Code Description Active Date MDM Diagnosis E11.622 Type 2 diabetes mellitus with other skin ulcer 04/17/2022 No Yes I73.9 Peripheral vascular disease, unspecified 04/17/2022 No Yes I87.2 Venous insufficiency (chronic) (peripheral) 04/17/2022 No Yes L97.812 Non-pressure chronic ulcer of other part of right lower leg with fat layer 06/13/2022 No Yes exposed Inactive Problems Resolved Problems ICD-10 Code Description Active Date Resolved Date L97.822 Non-pressure chronic ulcer of other part of left  lower leg with fat layer exposed 04/17/2022 04/17/2022 Electronic Signature(s) Signed: 07/31/2022 8:57:16 AM By: Fredirick Maudlin MD FACS Entered By: Fredirick Maudlin on 07/31/2022 08:57:16 -------------------------------------------------------------------------------- Progress Note Details Patient Name: Date of  Service: [name] 07/31/2022 8:30 A M Medical Record Number: [id number] Patient Account Number: [id number] Date of Birth/Sex: Treating RN: 1951/05/20 (71 y.o. M) Primary Care Provider: [name] Other Clinician: Referring Provider: Treating Provider/Extender: Casandra Doffing in Treatment: 15 Subjective Chief Complaint Information obtained from Patient Patient seen for complaints of Non-Healing Wounds. History of Present Illness (HPI) 11/10/2020 patient presents today for initial evaluation in this clinic although I have seen this patient in [place] previous. Subsequently his issue today is different from when I saw him for I was treating him for a toe ulcer. Currently he is having issues with bilateral lower extremity ulcers after running into a metal bar. Upon inspection today he has wounds of the bilateral lower extremities which are consistent with having struck his legs on the anterior aspect. With that being said the patient did go to the ER for evaluation on October 11 where he was given Keflex initially. He went back to the ER after not improving on October 20 he was given Lasix at that point he states the Lasix seem to help the most. Has been using Neosporin and leaving this open area which is probably not the best way to go either to be honest. He has had arterial studies on March 2021 which showed a left ABI of 0.78 with a TBI of 0.34 and a right ABI of 1.18 with a TBI 1.18. Nonetheless his arterial flow the left is not ideal but also I think potentially consistent with allowing this to heal. I think he can also support  light compression with Kerlix and Coban based on this result. His most recent hemoglobin A1c that we could find was 7.3 and that was towards the end of 2020. The patient does have a history of heart disease unfortunately. He is also currently still a smoker. 11/24/2020 on evaluation today patient appears to be doing well with regard to his wounds. In fact both appear to be doing better after last week's rather last visit's debridement. Fortunately there is no signs of active infection at this time. No fevers, chills, nausea, vomiting, or diarrhea. Overall I am very pleased with where things stand today. 12/01/2020 on evaluation today patient appears to be doing well with regard to his leg ulcers. He has been tolerating the dressing changes without complication. Fortunately there is no signs of active infection at this time. No fevers, chills, nausea, vomiting, or diarrhea. 12/08/2020 on evaluation today patient appears to be doing very well in regard to his bilateral lower extremity ulcers. They seem to be doing excellent and he is making great progress. There does not appear to be any evidence of active infection at this time. No fevers, chills, nausea, vomiting, or diarrhea. 12/15/2020 upon evaluation today patient actually appears to be doing excellent in regard to his wounds. Has been tolerating the dressing changes without complication. Fortunately his wounds are showing signs of great improvement were using Hydrofera Blue. 12/29/2020 on evaluation today patient appears to be doing well with regard to his right leg which is completely healed. His left leg is also measuring smaller than not healed this is doing very well. Fortunately there is no signs of active infection at this time 01/05/2021 upon evaluation today patient actually appears to be making signs of improvement with regard to his left leg. I am very pleased with how things are progressing. There is no evidence of active infection at this  time. No fevers, chills, nausea, vomiting, or  diarrhea. Patient is extremely happy with the fact that this is doing so well 01/12/2021 upon evaluation today patient appears to be doing excellent in regard to his leg ulcer. Fortunately there is no signs of active infection at this time. No fevers, chills, nausea, vomiting, or diarrhea. He has been tolerating the dressing changes without complication. 01/19/2021 upon evaluation today patient appears to be doing well with regard to his wound. In fact this appears to be completely healed on the left leg. This is brand-new skin but overall seems to be doing quite well which is great news. No fevers, chills, nausea, vomiting, or diarrhea. READMISSION 04/17/2022 This is a patient has been seen on a number of occasions in the wound care center. He is 72 years old and has type 2 diabetes mellitus, coronary artery disease, chronic venous insufficiency, and peripheral arterial disease with recent vascular studies demonstrating a right TBI of 0.9 (normal) and a left great toe TBI of 0.33. 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 He recently dropped an object and it hit him in the left shin. He then bumped the same leg against his wagon. He now has 2 wounds on his left anterior lower leg. He says that he applied Aquaphor to keep the areas moist and made an appointment in the wound care center for follow-up. 04/24/2022: Today, he is here with what he describes as 10 out of 10 pain in his wounds. Both of them measured a little bit larger today, but this may be secondary to the debridement that was performed last week. He says that due to it being spring season, he is on his feet quite a bit more. The leg is red and warm. We have been using silver alginate with Kerlix and Coban wraps. 05/01/2022: PCR culture taken last week grew out MRSA. I prescribed doxycycline. He has been taking this. Both wounds are a bit smaller today and somewhat less painful. They  continue to accumulate a bit of slough. 05/08/2022: He has completed his course of oral doxycycline. We have been using mupirocin under silver alginate with Kerlix and Coban wrapping. Today, both wounds are a bit smaller but have accumulated slough on the surface. They are less tender. 05/23/2022: Both wounds are quite a bit more shallow today. There is a little bit of slough accumulation, but there is also good granulation tissue as well as some buds of epithelialized tissue coming through the wound. No surrounding erythema, induration, nor any purulent drainage. We have been using mupirocin with silver alginate and Kerlix/Coban wrapping. 05/29/2022: The more anterior wound continues to demonstrate encroaching epithelium and the remaining open portion has good granulation tissue. The more lateral wound is nearly closed with just a pinpoint opening. No concern for infection. 06/05/2022: The lateral wound has healed. The more anterior wound is much smaller today with just a little bit of surrounding eschar. The wound surface is healthy-appearing with good granulation tissue. 06/13/2022: The wound on the anterior tibial surface on the left is nearly closed. There is just a tiny opening underneath some eschar. Unfortunately, his dog jumped up on him and cut his right lower leg. There is a flap of nonviable skin with some hematoma and bruising. No surrounding erythema or induration. No purulent discharge. 06/19/2022: The wound on his left leg is closed. The right sided wound is smaller, but there is significant periwound erythema and the leg is more tender. There is slough accumulation on the wound surface. 06/26/2022: The right sided wound  is about the same. It is tender but there is less periwound erythema. He still has slough on the wound surface. He has been taking Bactrim as prescribed. 07/03/2022: The PCR culture that I took demonstrated bacteria not sensitive to Bactrim so he was prescribed levofloxacin,  as supported by the culture data. He has been taking that and there has been significant improvement in his degree of pain. The wound itself is smaller with less slough accumulation. There is good granulation tissue emerging. 07/10/2022: The wound has contracted substantially and there is a lot of epithelialized tissue budding through. There is a small amount of slough on the surface. No concern for infection. 07/17/2022: The wound continues to contract and is down to just a fairly small open lesion. There is slough on the wound surface but underneath, the granulation tissue is healthy. No concern for infection. 07/24/2022: The wound is down to just a small slit. There is just a light layer of slough and some perimeter eschar present. 07/31/2022: The wound continues to contract. There is just some eschar overlying the surface. The underlying granulation tissue is robust. Patient History Information obtained from Patient. Social History Current every day smoker - 1 PPD, Marital Status - Married, Alcohol Use - Rarely, Drug Use - No History, Caffeine Use - Daily - coffee, soda. Medical History Eyes Patient has history of Cataracts - both eyes Cardiovascular Patient has history of Arrhythmia - A-fib, Coronary Artery Disease, Hypertension, Myocardial Infarction - CABG x 4, Peripheral Arterial Disease, Peripheral Venous Disease Endocrine Patient has history of Type II Diabetes Musculoskeletal Patient has history of Osteoarthritis Neurologic Patient has history of Neuropathy Oncologic Patient has history of Received Radiation Medical A Surgical History Notes nd Cardiovascular Hyperlipidemia, CVA, AAA Oncologic Prostate cancer Objective Constitutional No acute distress.. Vitals Time Taken: 8:40 AM, Height: 70 in, Weight: 219 lbs, BMI: 31.4, Temperature: 98.4 F, Pulse: 71 bpm, Respiratory Rate: 18 breaths/min, Blood Pressure: 113/66 mmHg. Respiratory Normal work of breathing on room  air.. General Notes: 07/31/2022: The wound continues to contract. There is just some eschar overlying the surface. The underlying granulation tissue is robust. Integumentary (Hair, Skin) Wound #5 status is Open. Original cause of wound was Trauma. The date acquired was: 06/05/2022. The wound has been in treatment 6 weeks. The wound is located on the Right Lower Leg. The wound measures 0.2cm length x 0.6cm width x 0.1cm depth; 0.094cm^2 area and 0.009cm^3 volume. There is Fat Layer (Subcutaneous Tissue) exposed. There is no tunneling or undermining noted. There is a medium amount of serosanguineous drainage noted. The wound margin is distinct with the outline attached to the wound base. There is large (67-100%) red granulation within the wound bed. There is no necrotic tissue within the wound bed. Assessment Active Problems ICD-10 Type 2 diabetes mellitus with other skin ulcer Peripheral vascular disease, unspecified Venous insufficiency (chronic) (peripheral) Non-pressure chronic ulcer of other part of right lower leg with fat layer exposed Procedures Wound #5 Pre-procedure diagnosis of Wound #5 is a Trauma, Other located on the Right Lower Leg . There was a Selective/Open Wound Non-Viable Tissue Debridement with a total area of 0.12 sq cm performed by Fredirick Maudlin, MD. With the following instrument(s): Curette to remove Non-Viable tissue/material. Material removed includes Eschar and Slough and after achieving pain control using Lidocaine 5% topical ointment. No specimens were taken. A time out was conducted at 08:52, prior to the start of the procedure. A Minimum amount of bleeding was controlled with Pressure. The procedure was  tolerated well with a pain level of 0 throughout and a pain level of 0 following the procedure. Post Debridement Measurements: 0.2cm length x 0.6cm width x 0.1cm depth; 0.009cm^3 volume. Character of Wound/Ulcer Post Debridement is improved. Post procedure  Diagnosis Wound #5: Same as Pre-Procedure Plan Follow-up Appointments: Return Appointment in 1 week. - Dr Celine Ahr - Room 2 - 8/14 at 7:45 AM Bathing/ Shower/ Hygiene: May shower with protection but do not get wound dressing(s) wet. - Please do not get Left Leg wet/damp. Edema Control - Lymphedema / SCD / Other: Avoid standing for long periods of time. Exercise regularly Moisturize legs daily. WOUND #5: - Lower Leg Wound Laterality: Right Cleanser: Soap and Water 1 x Per Week/30 Days Discharge Instructions: May shower and wash wound with dial antibacterial soap and water prior to dressing change. Cleanser: Wound Cleanser 1 x Per Week/30 Days Discharge Instructions: Cleanse the wound with wound cleanser prior to applying a clean dressing using gauze sponges, not tissue or cotton balls. Peri-Wound Care: Triamcinolone 15 (g) 1 x Per Week/30 Days Discharge Instructions: Use triamcinolone 15 (g) as directed Peri-Wound Care: Sween Lotion (Moisturizing lotion) 1 x Per Week/30 Days Discharge Instructions: Apply moisturizing lotion as directed Topical: Mupirocin Ointment 1 x Per Week/30 Days Discharge Instructions: Apply Mupirocin (Bactroban) as instructed Prim Dressing: KerraCel Ag Gelling Fiber Dressing, 2x2 in (silver alginate) 1 x Per Week/30 Days ary Discharge Instructions: Apply silver alginate to wound bed as instructed Secondary Dressing: Woven Gauze Sponge, Non-Sterile 4x4 in 1 x Per Week/30 Days Discharge Instructions: Apply over primary dressing as directed. Secured With: 82M Medipore Public affairs consultant Surgical T 2x10 (in/yd) 1 x Per Week/30 Days ape Discharge Instructions: Secure with tape as directed. Com pression Wrap: Kerlix Roll 4.5x3.1 (in/yd) 1 x Per Week/30 Days Discharge Instructions: Apply Kerlix and Coban compression as directed. Com pression Wrap: Coban Self-Adherent Wrap 4x5 (in/yd) 1 x Per Week/30 Days Discharge Instructions: Apply over Kerlix as directed. 07/31/2022: The wound  continues to contract. There is just some eschar overlying the surface. The underlying granulation tissue is robust. I used a curette to debride the eschar from the wound surface. We will continue using mupirocin with silver alginate and 3 layer compression. Follow-up in 1 week. Electronic Signature(s) Signed: 07/31/2022 9:28:29 AM By: Fredirick Maudlin MD FACS Entered By: Fredirick Maudlin on 07/31/2022 09:28:29 -------------------------------------------------------------------------------- HxROS Details Patient Name: Date of Service: [name], [name] 07/31/2022 8:30 A M Medical Record Number: [id number] Patient Account Number: [id number] Date of Birth/Sex: Treating RN: August 09, 1951 (71 y.o. M) Primary Care Provider: [name] Other Clinician: Referring Provider: Treating Provider/Extender: Casandra Doffing in Treatment: 15 Information Obtained From Patient Eyes Medical History: Positive for: Cataracts - both eyes Cardiovascular Medical History: Positive for: Arrhythmia - A-fib; Coronary Artery Disease; Hypertension; Myocardial Infarction - CABG x 4; Peripheral Arterial Disease; Peripheral Venous Disease Past Medical History Notes: Hyperlipidemia, CVA, AAA Endocrine Medical History: Positive for: Type II Diabetes Time with diabetes: over 5 years Treated with: Oral agents Blood sugar tested every day: No Musculoskeletal Medical History: Positive for: Osteoarthritis Neurologic Medical History: Positive for: Neuropathy Oncologic Medical History: Positive for: Received Radiation Past Medical History Notes: Prostate cancer HBO Extended History Items Eyes: Cataracts Immunizations Pneumococcal Vaccine: Received Pneumococcal Vaccination: Yes Received Pneumococcal Vaccination On or After 60th Birthday: Yes Implantable Devices None Family and Social History Current every day smoker - 1 PPD; Marital Status - Married; Alcohol Use: Rarely; Drug  Use: No History; Caffeine Use: Daily -  coffee, soda; Financial Concerns: No; Food, Clothing or Shelter Needs: No; Support System Lacking: No; Transportation Concerns: No Electronic Signature(s) Signed: 07/31/2022 11:24:36 AM By: Fredirick Maudlin MD FACS Entered By: Fredirick Maudlin on 07/31/2022 09:16:45 -------------------------------------------------------------------------------- SuperBill Details Patient Name: Date of Service: [name], [name] 07/31/2022 Medical Record Number: [id number] Patient Account Number: [id number] Date of Birth/Sex: Treating RN: 1951-10-13 (71 y.o. M) Primary Care Provider: [name] Other Clinician: Referring Provider: Treating Provider/Extender: [name] Weeks in Treatment: 15 Diagnosis Coding ICD-10 Codes Code Description E11.622 Type 2 diabetes mellitus with other skin ulcer I73.9 Peripheral vascular disease, unspecified I87.2 Venous insufficiency (chronic) (peripheral) L97.812 Non-pressure chronic ulcer of other part of right lower leg with fat layer exposed Facility Procedures CPT4 Code: [id number] Description: [phone] - [facility] 1ST 20 SQ CM OR < ICD-10 Diagnosis Description L97.812 Non-pressure chronic ulcer of other part of right lower leg with fat layer expos Modifier: ed Quantity: 1 Physician Procedures : CPT4 Code Description Modifier [id number] - WC PHYS LEVEL 4 - EST PT 25 ICD-10 Diagnosis Description L97.812 Non-pressure chronic ulcer of other part of right lower leg with fat layer exposed I73.9 Peripheral vascular disease, unspecified I87.2  Venous insufficiency (chronic) (peripheral) E11.622 Type 2 diabetes mellitus with other skin ulcer Quantity: 1 : [id number] - WC PHYS DEBR WO ANESTH 20 SQ CM ICD-10 Diagnosis Description P38.250 Non-pressure chronic ulcer of other part of right lower leg with fat layer exposed Quantity: 1 Electronic Signature(s) Signed: 07/31/2022 9:29:20 AM By: Fredirick Maudlin  MD FACS Entered By: Fredirick Maudlin on 07/31/2022 09:29:20

## 2022-08-07 ENCOUNTER — Encounter (HOSPITAL_BASED_OUTPATIENT_CLINIC_OR_DEPARTMENT_OTHER): Payer: PPO | Admitting: General Surgery

## 2022-08-07 DIAGNOSIS — S81801A Unspecified open wound, right lower leg, initial encounter: Secondary | ICD-10-CM | POA: Diagnosis not present

## 2022-08-07 DIAGNOSIS — E11622 Type 2 diabetes mellitus with other skin ulcer: Secondary | ICD-10-CM | POA: Diagnosis not present

## 2022-08-11 ENCOUNTER — Other Ambulatory Visit: Payer: Self-pay | Admitting: Thoracic Surgery (Cardiothoracic Vascular Surgery)

## 2022-08-11 ENCOUNTER — Ambulatory Visit
Admission: RE | Admit: 2022-08-11 | Discharge: 2022-08-11 | Disposition: A | Discharge status: Home / Self Care | Payer: PPO | Source: Ambulatory Visit | Attending: Thoracic Surgery (Cardiothoracic Vascular Surgery) | Admitting: Thoracic Surgery (Cardiothoracic Vascular Surgery)

## 2022-08-11 DIAGNOSIS — R911 Solitary pulmonary nodule: Secondary | ICD-10-CM

## 2022-08-11 DIAGNOSIS — I700 Atherosclerosis of aorta: Secondary | ICD-10-CM | POA: Diagnosis not present

## 2022-08-11 DIAGNOSIS — J439 Emphysema, unspecified: Secondary | ICD-10-CM | POA: Diagnosis not present

## 2022-08-11 NOTE — Progress Notes (Signed)
MALAKI, KOURY ([id number]) . Visit Report for 08/07/2022 Chief Complaint Document Details Patient Name: Date of Service: [name]. 08/07/2022 7:45 A M Medical Record Number: [id number] Patient Account Number: [id number] Date of Birth/Sex: Treating RN: 1951-08-21 (71 y.o. M) Primary Care Provider: [name] Other Clinician: Referring Provider: Treating Provider/Extender: Casandra Doffing in Treatment: 16 Information Obtained from: Patient Chief Complaint Patient seen for complaints of Non-Healing Wounds. Electronic Signature(s) Signed: 08/07/2022 8:07:40 AM By: Fredirick Maudlin MD FACS Entered By: Fredirick Maudlin on 08/07/2022 08:07:40 -------------------------------------------------------------------------------- HPI Details Patient Name: Date of Service: [name], Marcus [name] 08/07/2022 7:45 A M Medical Record Number: [id number] Patient Account Number: [id number] Date of Birth/Sex: Treating RN: 06/26/1951 (71 y.o. M) Primary Care Provider: [name] Other Clinician: Referring Provider: Treating Provider/Extender: Casandra Doffing in Treatment: 16 History of Present Illness HPI Description: 11/10/2020 patient presents today for initial evaluation in this clinic although I have seen this patient in [place] previous. Subsequently his issue today is different from when I saw him for I was treating him for a toe ulcer. Currently he is having issues with bilateral lower extremity ulcers after running into a metal bar. Upon inspection today he has wounds of the bilateral lower extremities which are consistent with having struck his legs on the anterior aspect. With that being said the patient did go to the ER for evaluation on October 11 where he was given Keflex initially. He went back to the ER after not improving on October 20 he was given Lasix at that point he states the Lasix seem to help the most. Has been using  Neosporin and leaving this open area which is probably not the best way to go either to be honest. He has had arterial studies on March 2021 which showed a left ABI of 0.78 with a TBI of 0.34 and a right ABI of 1.18 with a TBI 1.18. Nonetheless his arterial flow the left is not ideal but also I think potentially consistent with allowing this to heal. I think he can also support light compression with Kerlix and Coban based on this result. His most recent hemoglobin A1c that we could find was 7.3 and that was towards the end of 2020. The patient does have a history of heart disease unfortunately. He is also currently still a smoker. 11/24/2020 on evaluation today patient appears to be doing well with regard to his wounds. In fact both appear to be doing better after last week's rather last visit's debridement. Fortunately there is no signs of active infection at this time. No fevers, chills, nausea, vomiting, or diarrhea. Overall I am very pleased with where things stand today. 12/01/2020 on evaluation today patient appears to be doing well with regard to his leg ulcers. He has been tolerating the dressing changes without complication. Fortunately there is no signs of active infection at this time. No fevers, chills, nausea, vomiting, or diarrhea. 12/08/2020 on evaluation today patient appears to be doing very well in regard to his bilateral lower extremity ulcers. They seem to be doing excellent and he is making great progress. There does not appear to be any evidence of active infection at this time. No fevers, chills, nausea, vomiting, or diarrhea. 12/15/2020 upon evaluation today patient actually appears to be doing excellent in regard to his wounds. Has been tolerating the dressing changes without complication. Fortunately his wounds are showing signs of great improvement were using Hydrofera Blue. 12/29/2020 on  evaluation today patient appears to be doing well with regard to his right leg which is  completely healed. His left leg is also measuring smaller than not healed this is doing very well. Fortunately there is no signs of active infection at this time 01/05/2021 upon evaluation today patient actually appears to be making signs of improvement with regard to his left leg. I am very pleased with how things are progressing. There is no evidence of active infection at this time. No fevers, chills, nausea, vomiting, or diarrhea. Patient is extremely happy with the fact that this is doing so well 01/12/2021 upon evaluation today patient appears to be doing excellent in regard to his leg ulcer. Fortunately there is no signs of active infection at this time. No fevers, chills, nausea, vomiting, or diarrhea. He has been tolerating the dressing changes without complication. 01/19/2021 upon evaluation today patient appears to be doing well with regard to his wound. In fact this appears to be completely healed on the left leg. This is brand-new skin but overall seems to be doing quite well which is great news. No fevers, chills, nausea, vomiting, or diarrhea. READMISSION 04/17/2022 This is a patient has been seen on a number of occasions in the wound care center. He is 71 years old and has type 2 diabetes mellitus, coronary artery disease, chronic venous insufficiency, and peripheral arterial disease with recent vascular studies demonstrating a right TBI of 0.9 (normal) and a left great toe TBI of 0.33. 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 He recently dropped an object and it hit him in the left shin. He then bumped the same leg against his wagon. He now has 2 wounds on his left anterior lower leg. He says that he applied Aquaphor to keep the areas moist and made an appointment in the wound care center for follow-up. 04/24/2022: Today, he is here with what he describes as 10 out of 10 pain in his wounds. Both of them measured a little bit larger today, but this may be secondary to the  debridement that was performed last week. He says that due to it being spring season, he is on his feet quite a bit more. The leg is red and warm. We have been using silver alginate with Kerlix and Coban wraps. 05/01/2022: PCR culture taken last week grew out MRSA. I prescribed doxycycline. He has been taking this. Both wounds are a bit smaller today and somewhat less painful. They continue to accumulate a bit of slough. 05/08/2022: He has completed his course of oral doxycycline. We have been using mupirocin under silver alginate with Kerlix and Coban wrapping. Today, both wounds are a bit smaller but have accumulated slough on the surface. They are less tender. 05/23/2022: Both wounds are quite a bit more shallow today. There is a little bit of slough accumulation, but there is also good granulation tissue as well as some buds of epithelialized tissue coming through the wound. No surrounding erythema, induration, nor any purulent drainage. We have been using mupirocin with silver alginate and Kerlix/Coban wrapping. 05/29/2022: The more anterior wound continues to demonstrate encroaching epithelium and the remaining open portion has good granulation tissue. The more lateral wound is nearly closed with just a pinpoint opening. No concern for infection. 06/05/2022: The lateral wound has healed. The more anterior wound is much smaller today with just a little bit of surrounding eschar. The wound surface is healthy-appearing with good granulation tissue. 06/13/2022: The wound on the anterior tibial  surface on the left is nearly closed. There is just a tiny opening underneath some eschar. Unfortunately, his dog jumped up on him and cut his right lower leg. There is a flap of nonviable skin with some hematoma and bruising. No surrounding erythema or induration. No purulent discharge. 06/19/2022: The wound on his left leg is closed. The right sided wound is smaller, but there is significant periwound erythema and  the leg is more tender. There is slough accumulation on the wound surface. 06/26/2022: The right sided wound is about the same. It is tender but there is less periwound erythema. He still has slough on the wound surface. He has been taking Bactrim as prescribed. 07/03/2022: The PCR culture that I took demonstrated bacteria not sensitive to Bactrim so he was prescribed levofloxacin, as supported by the culture data. He has been taking that and there has been significant improvement in his degree of pain. The wound itself is smaller with less slough accumulation. There is good granulation tissue emerging. 07/10/2022: The wound has contracted substantially and there is a lot of epithelialized tissue budding through. There is a small amount of slough on the surface. No concern for infection. 07/17/2022: The wound continues to contract and is down to just a fairly small open lesion. There is slough on the wound surface but underneath, the granulation tissue is healthy. No concern for infection. 07/24/2022: The wound is down to just a small slit. There is just a light layer of slough and some perimeter eschar present. 07/31/2022: The wound continues to contract. There is just some eschar overlying the surface. The underlying granulation tissue is robust. 08/07/2022: His wound has healed. Electronic Signature(s) Signed: 08/07/2022 8:07:57 AM By: Fredirick Maudlin MD FACS Entered By: Fredirick Maudlin on 08/07/2022 08:07:57 -------------------------------------------------------------------------------- Physical Exam Details Patient Name: Date of Service: [name], Marcus [name] 08/07/2022 7:45 A M Medical Record Number: [id number] Patient Account Number: [id number] Date of Birth/Sex: Treating RN: 02-Jun-1951 (71 y.o. M) Primary Care Provider: [name] Other Clinician: Referring Provider: Treating Provider/Extender: [name] Weeks in Treatment: 16 Constitutional . . . . No acute  distress.Marland Kitchen Respiratory Normal work of breathing on room air.. Notes 08/07/2022: His wound has healed. Electronic Signature(s) Signed: 08/07/2022 8:08:27 AM By: Fredirick Maudlin MD FACS Entered By: Fredirick Maudlin on 08/07/2022 08:08:27 -------------------------------------------------------------------------------- Physician Orders Details Patient Name: Date of Service: [name], Marcus [name] 08/07/2022 7:45 A M Medical Record Number: [id number] Patient Account Number: [id number] Date of Birth/Sex: Treating RN: 06-May-1951 (71 y.o. [name] Primary Care Provider: [name] Other Clinician: Referring Provider: Treating Provider/Extender: Casandra Doffing in Treatment: [phone] Verbal / Phone Orders: No Diagnosis Coding ICD-10 Coding Code Description E11.622 Type 2 diabetes mellitus with other skin ulcer I73.9 Peripheral vascular disease, unspecified I87.2 Venous insufficiency (chronic) (peripheral) L97.812 Non-pressure chronic ulcer of other part of right lower leg with fat layer exposed Discharge From [facility] Discharge from [place]!!! Edema Control - Lymphedema / SCD / Other Bilateral Lower Extremities Elevate legs to the level of the heart or above for 30 minutes daily and/or when sitting, a frequency of: Avoid standing for long periods of time. Patient to wear own compression stockings every day. Exercise regularly Moisturize legs daily. Electronic Signature(s) Signed: 08/07/2022 8:08:38 AM By: Fredirick Maudlin MD FACS Entered By: Fredirick Maudlin on 08/07/2022 08:08:38 -------------------------------------------------------------------------------- Problem List Details Patient Name: Date of Service: [name], Marcus [name] 08/07/2022 7:45 A M  Medical Record Number: [id number] Patient Account Number: [id number] Date of Birth/Sex: Treating RN: 04/15/1951 (71 y.o. M) Primary Care Provider: [name] Other  Clinician: Referring Provider: Treating Provider/Extender: Casandra Doffing in Treatment: 16 Active Problems ICD-10 Encounter Encounter Code Description Active Date MDM Diagnosis E11.622 Type 2 diabetes mellitus with other skin ulcer 04/17/2022 No Yes I73.9 Peripheral vascular disease, unspecified 04/17/2022 No Yes I87.2 Venous insufficiency (chronic) (peripheral) 04/17/2022 No Yes L97.812 Non-pressure chronic ulcer of other part of right lower leg with fat layer 06/13/2022 No Yes exposed Inactive Problems Resolved Problems ICD-10 Code Description Active Date Resolved Date L97.822 Non-pressure chronic ulcer of other part of left lower leg with fat layer exposed 04/17/2022 04/17/2022 Electronic Signature(s) Signed: 08/07/2022 8:07:24 AM By: Fredirick Maudlin MD FACS Entered By: Fredirick Maudlin on 08/07/2022 08:07:24 -------------------------------------------------------------------------------- Progress Note Details Patient Name: Date of Service: [name], Marcus [name] 08/07/2022 7:45 A M Medical Record Number: [id number] Patient Account Number: [id number] Date of Birth/Sex: Treating RN: [birth year] (71 y.o. M) Primary Care Provider: [name] Other Clinician: Referring Provider: Treating Provider/Extender: Casandra Doffing in Treatment: 16 Subjective Chief Complaint Information obtained from Patient Patient seen for complaints of Non-Healing Wounds. History of Present Illness (HPI) 11/10/2020 patient presents today for initial evaluation in this clinic although I have seen this patient in [place] previous. Subsequently his issue today is different from when I saw him for I was treating him for a toe ulcer. Currently he is having issues with bilateral lower extremity ulcers after running into a metal bar. Upon inspection today he has wounds of the bilateral lower extremities which are consistent with having struck his legs on the  anterior aspect. With that being said the patient did go to the ER for evaluation on October 11 where he was given Keflex initially. He went back to the ER after not improving on October 20 he was given Lasix at that point he states the Lasix seem to help the most. Has been using Neosporin and leaving this open area which is probably not the best way to go either to be honest. He has had arterial studies on March 2021 which showed a left ABI of 0.78 with a TBI of 0.34 and a right ABI of 1.18 with a TBI 1.18. Nonetheless his arterial flow the left is not ideal but also I think potentially consistent with allowing this to heal. I think he can also support light compression with Kerlix and Coban based on this result. His most recent hemoglobin A1c that we could find was 7.3 and that was towards the end of 2020. The patient does have a history of heart disease unfortunately. He is also currently still a smoker. 11/24/2020 on evaluation today patient appears to be doing well with regard to his wounds. In fact both appear to be doing better after last week's rather last visit's debridement. Fortunately there is no signs of active infection at this time. No fevers, chills, nausea, vomiting, or diarrhea. Overall I am very pleased with where things stand today. 12/01/2020 on evaluation today patient appears to be doing well with regard to his leg ulcers. He has been tolerating the dressing changes without complication. Fortunately there is no signs of active infection at this time. No fevers, chills, nausea, vomiting, or diarrhea. 12/08/2020 on evaluation today patient appears to be doing very well in regard to his bilateral lower extremity ulcers. They seem to be doing excellent and he is making great progress. There  does not appear to be any evidence of active infection at this time. No fevers, chills, nausea, vomiting, or diarrhea. 12/15/2020 upon evaluation today patient actually appears to be doing excellent  in regard to his wounds. Has been tolerating the dressing changes without complication. Fortunately his wounds are showing signs of great improvement were using Hydrofera Blue. 12/29/2020 on evaluation today patient appears to be doing well with regard to his right leg which is completely healed. His left leg is also measuring smaller than not healed this is doing very well. Fortunately there is no signs of active infection at this time 01/05/2021 upon evaluation today patient actually appears to be making signs of improvement with regard to his left leg. I am very pleased with how things are progressing. There is no evidence of active infection at this time. No fevers, chills, nausea, vomiting, or diarrhea. Patient is extremely happy with the fact that this is doing so well 01/12/2021 upon evaluation today patient appears to be doing excellent in regard to his leg ulcer. Fortunately there is no signs of active infection at this time. No fevers, chills, nausea, vomiting, or diarrhea. He has been tolerating the dressing changes without complication. 01/19/2021 upon evaluation today patient appears to be doing well with regard to his wound. In fact this appears to be completely healed on the left leg. This is brand-new skin but overall seems to be doing quite well which is great news. No fevers, chills, nausea, vomiting, or diarrhea. READMISSION 04/17/2022 This is a patient has been seen on a number of occasions in the wound care center. He is 71 years old and has type 2 diabetes mellitus, coronary artery disease, chronic venous insufficiency, and peripheral arterial disease with recent vascular studies demonstrating a right TBI of 0.9 (normal) and a left great toe TBI of 0.33. 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 He recently dropped an object and it hit him in the left shin. He then bumped the same leg against his wagon. He now has 2 wounds on his left anterior lower leg. He says that  he applied Aquaphor to keep the areas moist and made an appointment in the wound care center for follow-up. 04/24/2022: Today, he is here with what he describes as 10 out of 10 pain in his wounds. Both of them measured a little bit larger today, but this may be secondary to the debridement that was performed last week. He says that due to it being spring season, he is on his feet quite a bit more. The leg is red and warm. We have been using silver alginate with Kerlix and Coban wraps. 05/01/2022: PCR culture taken last week grew out MRSA. I prescribed doxycycline. He has been taking this. Both wounds are a bit smaller today and somewhat less painful. They continue to accumulate a bit of slough. 05/08/2022: He has completed his course of oral doxycycline. We have been using mupirocin under silver alginate with Kerlix and Coban wrapping. Today, both wounds are a bit smaller but have accumulated slough on the surface. They are less tender. 05/23/2022: Both wounds are quite a bit more shallow today. There is a little bit of slough accumulation, but there is also good granulation tissue as well as some buds of epithelialized tissue coming through the wound. No surrounding erythema, induration, nor any purulent drainage. We have been using mupirocin with silver alginate and Kerlix/Coban wrapping. 05/29/2022: The more anterior wound continues to demonstrate encroaching epithelium and the remaining open  portion has good granulation tissue. The more lateral wound is nearly closed with just a pinpoint opening. No concern for infection. 06/05/2022: The lateral wound has healed. The more anterior wound is much smaller today with just a little bit of surrounding eschar. The wound surface is healthy-appearing with good granulation tissue. 06/13/2022: The wound on the anterior tibial surface on the left is nearly closed. There is just a tiny opening underneath some eschar. Unfortunately, his dog jumped up on him and cut his  right lower leg. There is a flap of nonviable skin with some hematoma and bruising. No surrounding erythema or induration. No purulent discharge. 06/19/2022: The wound on his left leg is closed. The right sided wound is smaller, but there is significant periwound erythema and the leg is more tender. There is slough accumulation on the wound surface. 06/26/2022: The right sided wound is about the same. It is tender but there is less periwound erythema. He still has slough on the wound surface. He has been taking Bactrim as prescribed. 07/03/2022: The PCR culture that I took demonstrated bacteria not sensitive to Bactrim so he was prescribed levofloxacin, as supported by the culture data. He has been taking that and there has been significant improvement in his degree of pain. The wound itself is smaller with less slough accumulation. There is good granulation tissue emerging. 07/10/2022: The wound has contracted substantially and there is a lot of epithelialized tissue budding through. There is a small amount of slough on the surface. No concern for infection. 07/17/2022: The wound continues to contract and is down to just a fairly small open lesion. There is slough on the wound surface but underneath, the granulation tissue is healthy. No concern for infection. 07/24/2022: The wound is down to just a small slit. There is just a light layer of slough and some perimeter eschar present. 07/31/2022: The wound continues to contract. There is just some eschar overlying the surface. The underlying granulation tissue is robust. 08/07/2022: His wound has healed. Patient History Information obtained from Patient. Social History Current every day smoker - 1 PPD, Marital Status - Married, Alcohol Use - Rarely, Drug Use - No History, Caffeine Use - Daily - coffee, soda. Medical History Eyes Patient has history of Cataracts - both eyes Cardiovascular Patient has history of Arrhythmia - A-fib, Coronary Artery  Disease, Hypertension, Myocardial Infarction - CABG x 4, Peripheral Arterial Disease, Peripheral Venous Disease Endocrine Patient has history of Type II Diabetes Musculoskeletal Patient has history of Osteoarthritis Neurologic Patient has history of Neuropathy Oncologic Patient has history of Received Radiation Medical A Surgical History Notes nd Cardiovascular Hyperlipidemia, CVA, AAA Oncologic Prostate cancer Objective Constitutional No acute distress.. Vitals Time Taken: 7:42 AM, Height: 70 in, Weight: 219 lbs, BMI: 31.4, Temperature: 97.7 F, Pulse: 84 bpm, Respiratory Rate: 18 breaths/min, Blood Pressure: 130/70 mmHg. Respiratory Normal work of breathing on room air.. General Notes: 08/07/2022: His wound has healed. Integumentary (Hair, Skin) Wound #5 status is Open. Original cause of wound was Trauma. The date acquired was: 06/05/2022. The wound has been in treatment 7 weeks. The wound is located on the Right Lower Leg. The wound measures 0cm length x 0cm width x 0cm depth; 0cm^2 area and 0cm^3 volume. There is no tunneling or undermining noted. There is a none present amount of drainage noted. The wound margin is distinct with the outline attached to the wound base. There is no granulation within the wound bed. There is no necrotic tissue within the wound bed.  Assessment Active Problems ICD-10 Type 2 diabetes mellitus with other skin ulcer Peripheral vascular disease, unspecified Venous insufficiency (chronic) (peripheral) Non-pressure chronic ulcer of other part of right lower leg with fat layer exposed Plan Discharge From [facility] Services: Discharge from [place]!!! Edema Control - Lymphedema / SCD / Other: Elevate legs to the level of the heart or above for 30 minutes daily and/or when sitting, a frequency of: Avoid standing for long periods of time. Patient to wear own compression stockings every day. Exercise regularly Moisturize legs  daily. 08/07/2022: His wound has healed. I recommended that he protect the area with a simple Band-Aid or similar for the next week or so. He should wear his compression stockings daily. We will discharge him from the wound care center. Follow-up as needed. Electronic Signature(s) Signed: 08/07/2022 8:09:15 AM By: Fredirick Maudlin MD FACS Entered By: Fredirick Maudlin on 08/07/2022 08:09:14 -------------------------------------------------------------------------------- HxROS Details Patient Name: Date of Service: [name], Marcus [name] 08/07/2022 7:45 A M Medical Record Number: [id number] Patient Account Number: [id number] Date of Birth/Sex: Treating RN: [birth year] (71 y.o. M) Primary Care Provider: [name] Other Clinician: Referring Provider: Treating Provider/Extender: Casandra Doffing in Treatment: 16 Information Obtained From Patient Eyes Medical History: Positive for: Cataracts - both eyes Cardiovascular Medical History: Positive for: Arrhythmia - A-fib; Coronary Artery Disease; Hypertension; Myocardial Infarction - CABG x 4; Peripheral Arterial Disease; Peripheral Venous Disease Past Medical History Notes: Hyperlipidemia, CVA, AAA Endocrine Medical History: Positive for: Type II Diabetes Time with diabetes: over 5 years Treated with: Oral agents Blood sugar tested every day: No Musculoskeletal Medical History: Positive for: Osteoarthritis Neurologic Medical History: Positive for: Neuropathy Oncologic Medical History: Positive for: Received Radiation Past Medical History Notes: Prostate cancer HBO Extended History Items Eyes: Cataracts Immunizations Pneumococcal Vaccine: Received Pneumococcal Vaccination: Yes Received Pneumococcal Vaccination On or After 60th Birthday: Yes Implantable Devices None Family and Social History Current every day smoker - 1 PPD; Marital Status - Married; Alcohol Use: Rarely; Drug Use: No History;  Caffeine Use: Daily - coffee, soda; Financial Concerns: No; Food, Clothing or Shelter Needs: No; Support System Lacking: No; Transportation Concerns: No Electronic Signature(s) Signed: 08/07/2022 10:46:29 AM By: Fredirick Maudlin MD FACS Entered By: Fredirick Maudlin on 08/07/2022 08:08:04 -------------------------------------------------------------------------------- SuperBill Details Patient Name: Date of Service: [name], Marcus [name] 08/07/2022 Medical Record Number: [id number] Patient Account Number: [id number] Date of Birth/Sex: Treating RN: January 29, 1951 (71 y.o. [name] Primary Care Provider: [name] Other Clinician: Referring Provider: Treating Provider/Extender: [name] Weeks in Treatment: 16 Diagnosis Coding ICD-10 Codes Code Description E11.622 Type 2 diabetes mellitus with other skin ulcer I73.9 Peripheral vascular disease, unspecified I87.2 Venous insufficiency (chronic) (peripheral) L97.812 Non-pressure chronic ulcer of other part of right lower leg with fat layer exposed Facility Procedures CPT4 Code: 70177939 Description: 99213 - WOUND CARE VISIT-LEV 3 EST PT Modifier: Quantity: 1 Physician Procedures : CPT4 Code Description Modifier [id number] 99213 - WC PHYS LEVEL 3 - EST PT ICD-10 Diagnosis Description R00.762 Non-pressure chronic ulcer of other part of right lower leg with fat layer exposed E11.622 Type 2 diabetes mellitus with other skin ulcer I73.9  Peripheral vascular disease, unspecified I87.2 Venous insufficiency (chronic) (peripheral) Quantity: 1 Electronic Signature(s) Signed: 08/07/2022 8:09:31 AM By: Fredirick Maudlin MD FACS Entered By: Fredirick Maudlin on 08/07/2022 08:09:30

## 2022-08-11 NOTE — Progress Notes (Signed)
BARRY, FAIRCLOTH ([id number]) . Visit Report for 08/07/2022 Arrival Information Details Patient Name: Date of Service: [name] 08/07/2022 7:45 A M Medical Record Number: [id number] Patient Account Number: [id number] Date of Birth/Sex: Treating RN: 28-Jun-1951 (71 y.o. [name] Primary Care Wadell Craddock: [name] Other Clinician: Referring Daneil Beem: Treating Ethyn Schetter/Extender: Casandra Doffing in Treatment: 16 Visit Information History Since Last Visit Added or deleted any medications: No Patient Arrived: Ambulatory Any new allergies or adverse reactions: No Arrival Time: 07:39 Had a fall or experienced change in No Accompanied By: self activities of daily living that may affect Transfer Assistance: None risk of falls: Patient Identification Verified: Yes Signs or symptoms of abuse/neglect since last visito No Secondary Verification Process Completed: Yes Hospitalized since last visit: No Patient Requires Transmission-Based Precautions: No Implantable device outside of the clinic excluding No Patient Has Alerts: Yes cellular tissue based products placed in the center Patient Alerts: Patient on Blood Thinner since last visit: ABI R 1.11 Has Dressing in Place as Prescribed: Yes ABI L 0.72 Has Compression in Place as Prescribed: Yes TBI L 0.33 Pain Present Now: No Electronic Signature(s) Signed: 08/07/2022 4:58:09 PM By: Adline Peals Entered By: Adline Peals on 08/07/2022 07:42:50 -------------------------------------------------------------------------------- Clinic Level of Care Assessment Details Patient Name: Date of Service: [name] 08/07/2022 7:45 A M Medical Record Number: [id number] Patient Account Number: [id number] Date of Birth/Sex: Treating RN: April 07, 1951 (71 y.o. [name] Primary Care Quinci Gavidia: [name] Other Clinician: Referring Fidela Cieslak: Treating Ashante Yellin/Extender:  Casandra Doffing in Treatment: 16 Clinic Level of Care Assessment Items TOOL 4 Quantity Score X- 1 0 Use when only an EandM is performed on FOLLOW-UP visit ASSESSMENTS - Nursing Assessment / Reassessment X- 1 10 Reassessment of Co-morbidities (includes updates in patient status) X- 1 5 Reassessment of Adherence to Treatment Plan ASSESSMENTS - Wound and Skin A ssessment / Reassessment X - Simple Wound Assessment / Reassessment - one wound 1 5 '[]'$  - 0 Complex Wound Assessment / Reassessment - multiple wounds '[]'$  - 0 Dermatologic / Skin Assessment (not related to wound area) ASSESSMENTS - Focused Assessment X- 1 5 Circumferential Edema Measurements - multi extremities '[]'$  - 0 Nutritional Assessment / Counseling / Intervention X- 1 5 Lower Extremity Assessment (monofilament, tuning fork, pulses) '[]'$  - 0 Peripheral Arterial Disease Assessment (using hand held doppler) ASSESSMENTS - Ostomy and/or Continence Assessment and Care '[]'$  - 0 Incontinence Assessment and Management '[]'$  - 0 Ostomy Care Assessment and Management (repouching, etc.) PROCESS - Coordination of Care X - Simple Patient / Family Education for ongoing care 1 15 '[]'$  - 0 Complex (extensive) Patient / Family Education for ongoing care X- 1 10 Staff obtains Programmer, systems, Records, T Results / Process Orders est '[]'$  - 0 Staff telephones HHA, Nursing Homes / Clarify orders / etc '[]'$  - 0 Routine Transfer to another Facility (non-emergent condition) '[]'$  - 0 Routine Hospital Admission (non-emergent condition) '[]'$  - 0 New Admissions / Biomedical engineer / Ordering NPWT Apligraf, etc. , '[]'$  - 0 Emergency Hospital Admission (emergent condition) X- 1 10 Simple Discharge Coordination '[]'$  - 0 Complex (extensive) Discharge Coordination PROCESS - Special Needs '[]'$  - 0 Pediatric / Minor Patient Management '[]'$  - 0 Isolation Patient Management '[]'$  - 0 Hearing / Language / Visual special needs '[]'$  -  0 Assessment of Community assistance (transportation, D/C planning, etc.) '[]'$  - 0 Additional assistance / Altered mentation '[]'$  - 0 Support Surface(s) Assessment (bed, cushion,  seat, etc.) INTERVENTIONS - Wound Cleansing / Measurement X - Simple Wound Cleansing - one wound 1 5 '[]'$  - 0 Complex Wound Cleansing - multiple wounds X- 1 5 Wound Imaging (photographs - any number of wounds) '[]'$  - 0 Wound Tracing (instead of photographs) X- 1 5 Simple Wound Measurement - one wound '[]'$  - 0 Complex Wound Measurement - multiple wounds INTERVENTIONS - Wound Dressings X - Small Wound Dressing one or multiple wounds 1 10 '[]'$  - 0 Medium Wound Dressing one or multiple wounds '[]'$  - 0 Large Wound Dressing one or multiple wounds '[]'$  - 0 Application of Medications - topical '[]'$  - 0 Application of Medications - injection INTERVENTIONS - Miscellaneous '[]'$  - 0 External ear exam '[]'$  - 0 Specimen Collection (cultures, biopsies, blood, body fluids, etc.) '[]'$  - 0 Specimen(s) / Culture(s) sent or taken to Lab for analysis '[]'$  - 0 Patient Transfer (multiple staff / Civil Service fast streamer / Similar devices) '[]'$  - 0 Simple Staple / Suture removal (25 or less) '[]'$  - 0 Complex Staple / Suture removal (26 or more) '[]'$  - 0 Hypo / Hyperglycemic Management (close monitor of Blood Glucose) '[]'$  - 0 Ankle / Brachial Index (ABI) - do not check if billed separately X- 1 5 Vital Signs Has the patient been seen at the hospital within the last three years: Yes Total Score: 95 Level Of Care: New/Established - Level 3 Electronic Signature(s) Signed: 08/07/2022 4:58:09 PM By: Adline Peals Entered By: Adline Peals on 08/07/2022 08:05:37 -------------------------------------------------------------------------------- Encounter Discharge Information Details Patient Name: Date of Service: [name], [name] 08/07/2022 7:45 A M Medical Record Number: [id number] Patient Account Number: [id number] Date of Birth/Sex: Treating  RN: [birth year] (71 y.o. [name] Primary Care Persis Graffius: [name] Other Clinician: Referring Daiton Cowles: Treating Arretta Toenjes/Extender: Casandra Doffing in Treatment: 16 Encounter Discharge Information Items Discharge Condition: Stable Ambulatory Status: Ambulatory Discharge Destination: Home Transportation: Private Auto Accompanied By: self Schedule Follow-up Appointment: Yes Clinical Summary of Care: Patient Declined Electronic Signature(s) Signed: 08/07/2022 4:58:09 PM By: Adline Peals Entered By: Adline Peals on 08/07/2022 07:54:04 -------------------------------------------------------------------------------- Lower Extremity Assessment Details Patient Name: Date of Service: [name] 08/07/2022 7:45 A M Medical Record Number: [id number] Patient Account Number: [id number] Date of Birth/Sex: Treating RN: [birth year] (71 y.o. [name] Primary Care Ginni Eichler: [name] Other Clinician: Referring Hae Ahlers: Treating Olyver Hawes/Extender: [name] Weeks in Treatment: 16 Edema Assessment Assessed: Shirlyn Goltz: No] Patrice Paradise: No] Edema: [Left: Ye] [Right: s] Calf Left: Right: Point of Measurement: 34 cm From Medial Instep 36.1 cm 41.3 cm Ankle Left: Right: Point of Measurement: 12 cm From Medial Instep 25.5 cm 25.4 cm Vascular Assessment Pulses: Dorsalis Pedis Palpable: [Right:Yes] Electronic Signature(s) Signed: 08/07/2022 4:58:09 PM By: Adline Peals Entered By: Adline Peals on 08/07/2022 07:45:53 -------------------------------------------------------------------------------- Multi Wound Chart Details Patient Name: Date of Service: [name], [name] 08/07/2022 7:45 A M Medical Record Number: [id number] Patient Account Number: [id number] Date of Birth/Sex: Treating RN: 1951/10/22 (71 y.o. M) Primary Care Lexy Meininger: [name] Other Clinician: Referring  Kaiyon Hynes: Treating Vaniyah Lansky/Extender: Casandra Doffing in Treatment: 16 Vital Signs Height(in): 70 Pulse(bpm): 56 Weight(lbs): 219 Blood Pressure(mmHg): 130/70 Body Mass Index(BMI): 31.4 Temperature(F): 97.7 Respiratory Rate(breaths/min): 18 Photos: [N/A:N/A] Right Lower Leg N/A N/A Wound Location: Trauma N/A N/A Wounding Event: Trauma, Other N/A N/A Primary Etiology: Cataracts, Arrhythmia, Coronary N/A N/A Comorbid History: Artery Disease, Hypertension, Myocardial Infarction, Peripheral Arterial Disease, Peripheral Venous Disease, Type II Diabetes,  Osteoarthritis, Neuropathy, Received Radiation 06/05/2022 N/A N/A Date Acquired: 7 N/A N/A Weeks of Treatment: Open N/A N/A Wound Status: No N/A N/A Wound Recurrence: 0x0x0 N/A N/A Measurements L x W x D (cm) 0 N/A N/A A (cm) : rea 0 N/A N/A Volume (cm) : 100.00% N/A N/A % Reduction in Area: 100.00% N/A N/A % Reduction in Volume: Full Thickness Without Exposed N/A N/A Classification: Support Structures None Present N/A N/A Exudate Amount: Distinct, outline attached N/A N/A Wound Margin: None Present (0%) N/A N/A Granulation Amount: None Present (0%) N/A N/A Necrotic Amount: Fascia: No N/A N/A Exposed Structures: Fat Layer (Subcutaneous Tissue): No Tendon: No Muscle: No Joint: No Bone: No Large (67-100%) N/A N/A Epithelialization: Treatment Notes Wound #5 (Lower Leg) Wound Laterality: Right Cleanser Peri-Wound Care Topical Primary Dressing Secondary Dressing Secured With Compression Wrap Compression Stockings Add-Ons Electronic Signature(s) Signed: 08/07/2022 8:07:33 AM By: Fredirick Maudlin MD FACS Entered By: Fredirick Maudlin on 08/07/2022 08:07:33 -------------------------------------------------------------------------------- Multi-Disciplinary Care Plan Details Patient Name: Date of Service: [name], [name] 08/07/2022 7:45 A M Medical Record Number:  [id number] Patient Account Number: [id number] Date of Birth/Sex: Treating RN: 18-May-1951 (71 y.o. [name] Primary Care Florance Paolillo: [name] Other Clinician: Referring Citlaly Camplin: Treating Zaria Taha/Extender: [name] Weeks in Treatment: 16 Active Inactive Electronic Signature(s) Signed: 08/07/2022 4:58:09 PM By: Adline Peals Entered By: Adline Peals on 08/07/2022 07:53:00 -------------------------------------------------------------------------------- Pain Assessment Details Patient Name: Date of Service: [name] 08/07/2022 7:45 A M Medical Record Number: [id number] Patient Account Number: [id number] Date of Birth/Sex: Treating RN: Jan 15, 1951 (71 y.o. [name] Primary Care Maxey Ransom: [name] Other Clinician: Referring Johnae Friley: Treating Erin Uecker/Extender: Casandra Doffing in Treatment: 16 Active Problems Location of Pain Severity and Description of Pain Patient Has Paino No Site Locations Rate the pain. Rate the pain. Current Pain Level: 0 Pain Management and Medication Current Pain Management: Electronic Signature(s) Signed: 08/07/2022 4:58:09 PM By: Adline Peals Entered By: Adline Peals on 08/07/2022 07:43:14 -------------------------------------------------------------------------------- Patient/Caregiver Education Details Patient Name: Date of Service: [name] 8/14/2023andnbsp7:45 A M Medical Record Number: [id number] Patient Account Number: [id number] Date of Birth/Gender: Treating RN: 01/20/1951 (71 y.o. [name] Primary Care Physician: [name] Other Clinician: Referring Physician: Treating Physician/Extender: Casandra Doffing in Treatment: 16 Education Assessment Education Provided To: Patient Education Topics Provided Wound/Skin Impairment: Methods: Explain/Verbal Responses:  Reinforcements needed, State content correctly Electronic Signature(s) Signed: 08/07/2022 4:58:09 PM By: Adline Peals Entered By: Adline Peals on 08/07/2022 07:46:32 -------------------------------------------------------------------------------- Wound Assessment Details Patient Name: Date of Service: [name] 08/07/2022 7:45 A M Medical Record Number: [id number] Patient Account Number: [id number] Date of Birth/Sex: Treating RN: 12/03/1951 (71 y.o. [name] Primary Care Fines Kimberlin: [name] Other Clinician: Referring Carvell Hoeffner: Treating Jayquan Bradsher/Extender: [name] Weeks in Treatment: 16 Wound Status Wound Number: 5 Primary Trauma, Other Etiology: Wound Location: Right Lower Leg Wound Open Wounding Event: Trauma Status: Date Acquired: 06/05/2022 Comorbid Cataracts, Arrhythmia, Coronary Artery Disease, Hypertension, Weeks Of Treatment: 7 History: Myocardial Infarction, Peripheral Arterial Disease, Peripheral Venous Clustered Wound: No Disease, Type II Diabetes, Osteoarthritis, Neuropathy, Received Radiation Photos Wound Measurements Length: (cm) Width: (cm) Depth: (cm) Area: (cm) Volume: (cm) 0 % Reduction in Area: 100% 0 % Reduction in Volume: 100% 0 Epithelialization: Large (67-100%) 0 Tunneling: No 0 Undermining: No Wound Description Classification: Full Thickness Without Exposed Support Structures Wound Margin: Distinct, outline attached Exudate Amount:  None Present Foul Odor After Cleansing: No Slough/Fibrino No Wound Bed Granulation Amount: None Present (0%) Exposed Structure Necrotic Amount: None Present (0%) Fascia Exposed: No Fat Layer (Subcutaneous Tissue) Exposed: No Tendon Exposed: No Muscle Exposed: No Joint Exposed: No Bone Exposed: No Electronic Signature(s) Signed: 08/07/2022 4:58:09 PM By: Adline Peals Entered By: Adline Peals on 08/07/2022  07:50:22 -------------------------------------------------------------------------------- [place] Details Patient Name: Date of Service: [name], [name] 08/07/2022 7:45 A M Medical Record Number: [id number] Patient Account Number: [id number] Date of Birth/Sex: Treating RN: May 28, 1951 (71 y.o. [name] Primary Care Addelyn Alleman: [name] Other Clinician: Referring Anahita Cua: Treating Ixel Boehning/Extender: Casandra Doffing in Treatment: 16 Vital Signs Time Taken: 07:42 Temperature (F): 97.7 Height (in): 70 Pulse (bpm): 84 Weight (lbs): 219 Respiratory Rate (breaths/min): 18 Body Mass Index (BMI): 31.4 Blood Pressure (mmHg): 130/70 Reference Range: 80 - 120 mg / dl Electronic Signature(s) Signed: 08/07/2022 4:58:09 PM By: Adline Peals Entered By: Adline Peals on 08/07/2022 07:43:08

## 2022-08-14 DIAGNOSIS — E1151 Type 2 diabetes mellitus with diabetic peripheral angiopathy without gangrene: Secondary | ICD-10-CM | POA: Diagnosis not present

## 2022-08-14 DIAGNOSIS — D692 Other nonthrombocytopenic purpura: Secondary | ICD-10-CM | POA: Diagnosis not present

## 2022-08-14 DIAGNOSIS — Z7984 Long term (current) use of oral hypoglycemic drugs: Secondary | ICD-10-CM | POA: Diagnosis not present

## 2022-08-15 ENCOUNTER — Encounter: Payer: Self-pay | Admitting: Thoracic Surgery (Cardiothoracic Vascular Surgery)

## 2022-08-15 ENCOUNTER — Ambulatory Visit: Payer: PPO | Admitting: Thoracic Surgery (Cardiothoracic Vascular Surgery)

## 2022-08-15 VITALS — BP 111/66 | HR 85 | Resp 20 | Ht 71.0 in | Wt 220.0 lb

## 2022-08-15 DIAGNOSIS — R911 Solitary pulmonary nodule: Secondary | ICD-10-CM | POA: Diagnosis not present

## 2022-08-15 MED ORDER — CHANTIX STARTING MONTH PAK 0.5 MG X 11 & 1 MG X 42 PO TBPK: ORAL_TABLET | ORAL | 0 refills | Status: AC

## 2022-08-15 NOTE — Progress Notes (Signed)
[address]       [place] [address]             [phone]     HPI: [name] returns for a scheduled follow-up visit regarding a right upper lobe lung nodule  Cason "[name]" Lingelbach is a 71 year old man with a history of tobacco abuse, COPD, CAD, CABG in 2009, DVT, venous stasis, CKD, hypertension, hyperlipidemia, type 2 diabetes, peripheral neuropathy, PAD, and prostate cancer.  He has 100-pack-year history of smoking (3 packs a day for many years, currently 1 to 1.5 pack/day).  He recently had a low-dose CT scanning for lung cancer screening which showed a nodule that was increased in size from 6 to 11 mm over the course of 3 years.  He was referred to Dr. Valeta Harms.  He had a PET/CT which showed minimal activity at the nodule.  However given the appearance of the nodule and the interval growth this was felt to still be suspicious for a low-grade adenocarcinoma.  I saw him in June.  We discussed the options of resection versus continued radiographic follow-up.  After discussion we opted to do another scan at 3 months.  If this were infectious or inflammatory it should be smaller.  If the same or larger we would recommend surgery because of the high likelihood of an adenocarcinoma.  In the interim since his last visit he says he has cut back to about a pack a day smoking.  He has not tried to quit altogether.  He has not had any unusual respiratory symptoms.  No chest pain, pressure, or tightness.  Zubrod Score: At the time of surgery this patient's most appropriate activity status/level should be described as: _0     0    Normal activity, no symptoms _1     1    Restricted in physical strenuous activity but ambulatory, able to do out light work _2     2    Ambulatory and capable of self care, unable to do work activities, up and about >50 % of waking hours                              _3     3    Only limited self care, in bed greater than 50% of waking hours _4     4     Completely disabled, no self care, confined to bed or chair _5     5    Moribund  Past Medical History:  Diagnosis Date   Abdominal aortic aneurysm (AAA) ([place])    3 cm per 07-02-18 US abdominal aorta US epic   Arthritis    Blood clot in vein    behind left knee   Chronic kidney disease    renal calculi   Chronic venous insufficiency LOWER EXTREMITIES   Coronary artery disease CARDIOLOGIST - DR  NIDPOEUM-  LAST 1 WK AGO -- WILL REQUEST NOTE AND STRESS TEST   Diabetes mellitus without complication ([place])    type 2   Hematuria    last year   Hyperglycemia    Hyperlipidemia    Hypertension    Mixed dyslipidemia    Myocardial infarction ([place])    Neuropathy    toes only   Nocturia    Prostate cancer ([place]) 08/18/11   gleason 8, volume 24.4cc   S/P CABG x 4    ST elevation MI (STEMI) ([place]) 02-10-2008  S/P CABG   Stroke [facility]) 2016   occ trouble wriing with right hand   Urinary hesitancy    Vision abnormalities    resolved now   Wound discharge    since jan 2019 goes to wound center [facility] left great toe small open area changes dressing q 2 days with ointment provided by wound center, clear drainage occ    Current Outpatient Medications  Medication Sig Dispense Refill   apixaban (ELIQUIS) 5 MG TABS tablet Take 1 tablet (5 mg total) by mouth 2 (two) times daily. 180 tablet 3   atorvastatin (LIPITOR) 10 MG tablet Take 1 tablet (10 mg total) by mouth daily. 90 tablet 1   blood glucose meter kit and supplies KIT Dispense based on patient and insurance preference. 1 each 0   Continuous Blood Gluc Receiver (FREESTYLE LIBRE 14 DAY READER) DEVI 1 Device by Does not apply route daily. Use to check blood sugars every morning and 2 hours after largest meals 1 each 1   Continuous Blood Gluc Sensor (FREESTYLE LIBRE 14 DAY SENSOR) MISC USE TO CHECK BLOOD SUGARS IN THE MORNING FASTED AND  2  HOURS  AFTER  LARGEST  MEAL 6 each 0   diclofenac Sodium (VOLTAREN) 1 % GEL Apply 2 g topically 4 (four)  times daily as needed. 50 g 0   finasteride (PROSCAR) 5 MG tablet Take 5 mg by mouth daily.     furosemide (LASIX) 20 MG tablet Take 1 tablet (20 mg total) by mouth daily as needed for edema. 30 tablet 0   gabapentin (NEURONTIN) 300 MG capsule TAKE 2 CAPSULES BY MOUTH IN THE MORNING AND 2 IN THE EVENING WITH DINNER 360 capsule 0   glipiZIDE (GLUCOTROL) 5 MG tablet Take 1 tablet (5 mg total) by mouth daily before supper. 90 tablet 1   glucose blood (FREESTYLE LITE) test strip Use to check blood sugars every morning fasting 100 each 12   lisinopril (ZESTRIL) 5 MG tablet Take 1 tablet by mouth once daily 90 tablet 0   metFORMIN (GLUCOPHAGE-XR) 500 MG 24 hr tablet Take 1 tablet (500 mg total) by mouth daily with breakfast. 90 tablet 1   methocarbamol (ROBAXIN) 500 MG tablet Take 1 tablet (500 mg total) by mouth every 8 (eight) hours as needed for muscle spasms. 20 tablet 0   mirabegron ER (MYRBETRIQ) 50 MG TB24 tablet Take 1 tablet (50 mg total) by mouth daily. 30 tablet 11   Multiple Vitamin (MULTIVITAMIN WITH MINERALS) TABS tablet Take 1 tablet by mouth daily. Men's One-A-Day Multivitamin     mupirocin cream (BACTROBAN) 2 % Apply 1 application topically 2 (two) times daily. 15 g 0   oxybutynin (DITROPAN-XL) 5 MG 24 hr tablet Take 5 mg by mouth at bedtime.     tamsulosin (FLOMAX) 0.4 MG CAPS capsule Take 1 capsule by mouth once daily at bedtime 90 capsule 1   Varenicline Tartrate, Starter, (CHANTIX STARTING MONTH PAK) 0.5 MG X 11 & 1 MG X 42 TBPK Take 0.5 mg by mouth daily for 3 days, THEN 0.5 mg daily for 4 days, THEN 1 mg daily. 1 each 0   No current facility-administered medications for this visit.    Physical Exam BP 111/66   Pulse 85   Resp 20   Ht _0  (1.803 m)   Wt 220 lb (99.8 kg)   SpO2 91% Comment: RA  BMI 30.70 kg/m  71 year old man in no acute distress Alert and oriented x3 with no focal  deficits No cervical supraclavicular adenopathy Lungs clear bilaterally no rales or  wheezing Cardiac regular rate and rhythm with normal S1 and S2 Edema both lower extremities with compression hose in place and venous stasis changes.  Diagnostic Tests: CT CHEST WITHOUT CONTRAST   TECHNIQUE: Multidetector CT imaging of the chest was performed using thin slice collimation for electromagnetic bronchoscopy planning purposes, without intravenous contrast.   RADIATION DOSE REDUCTION: This exam was performed according to the departmental dose-optimization program which includes automated exposure control, adjustment of the mA and/or kV according to patient size and/or use of iterative reconstruction technique.   COMPARISON:  PET-CT, 05/18/2022, CT chest, 04/24/2022   FINDINGS: Cardiovascular: Aortic atherosclerosis. Normal heart size. Subendocardial scarring of the left ventricular myocardium. Three-vessel coronary artery calcifications and stents status post median sternotomy and CABG. No pericardial effusion.   Mediastinum/Nodes: No enlarged mediastinal, hilar, or axillary lymph nodes. Thyroid gland, trachea, and esophagus demonstrate no significant findings.   Lungs/Pleura: Moderate centrilobular and paraseptal emphysema. No significant change in an irregular subpleural nodule of the posterior right apex, measuring 1.2 x 0.9 cm (series 8, image 29). Unchanged 0.4 cm nodule of the lateral segment right middle lobe (series 8, image 93). No pleural effusion or pneumothorax.   Upper Abdomen: No acute abnormality. Punctuate nonobstructive calculus of the superior pole of the left kidney (series 2, image 154).   Musculoskeletal: No chest wall abnormality. No acute osseous findings. Disc degenerative disease anterior bridging osteophytosis throughout the thoracic spine, consistent with DISH.   IMPRESSION: 1. No significant change in an irregular subpleural nodule of the posterior right apex, measuring 1.2 x 0.9 cm. This remains modestly suspicious for malignancy  given history of growth and morphology. Consider additional follow-up in 6 months or resumption of annual low-dose CT lung cancer screening. 2. Emphysema. 3. Coronary artery disease. 4. Nonobstructive left nephrolithiasis.   Aortic Atherosclerosis (ICD10-I70.0) and Emphysema (ICD10-J43.9).     Electronically Signed   By: Delanna Ahmadi M.D.   On: 08/11/2022 11:53 I personally reviewed the CT images.  There has been no significant change in the subpleural nodule in the right apex.  Upper lobe predominant emphysema.  Coronary and aortic atherosclerosis.  Impression: [name]" Cottingham is a 71 year old man with a history of tobacco abuse, COPD, CAD, CABG in 2009, DVT, venous stasis, CKD, hypertension, hyperlipidemia, type 2 diabetes, peripheral neuropathy, PAD, and prostate cancer.  Recent CT showed a nodule in the right upper lobe that is increased in size from 6 to 11 mm over the course of a couple of years.  There was only low-grade activity on PET.  However, findings are very suspicious for a low-grade adenocarcinoma.  Infectious and inflammatory nodules are also in the differential, but with a lack of significant change in the past 3 months I think those are far less likely.  We again discussed the options of continued radiographic follow-up versus surgical resection.  I recommended him that we proceed with a robotic wedge resection.  I described the postoperative procedure to him in detail.  I informed him of the general nature of the procedure including the need for general anesthesia, the incisions to be used, the use of the surgical robot, use of a drainage tube postoperatively, the expected hospital stay, and the overall recovery.  I informed him of the indications, risks, benefits, and alternatives.  He understands the risks include, but not limited to death, MI, DVT, PE, bleeding, possible need for transfusion, infection, prolonged air leak, cardiac arrhythmias, as  well as possibility  of other unforeseeable complications.  Given the small size of the nodule, I think marking the tumor with methylene blue and ICG would be helpful for localization.  Will discuss with Dr. Valeta Harms  He had CABG over 10 years ago.  I do think he would need cardiology clearance from Dr. Sallyanne Kuster prior to surgery.  Plan: Patient will call if he chooses to proceed with robotic right upper lobe wedge resection.  We will need preoperative marking by Dr. Valeta Harms if he decides to proceed. We will need preoperative cardiology clearance Would need to hold Eliquis for 48 hours prior to procedure Chantix starter pack  Melrose Nakayama, MD [facility] [phone]

## 2022-08-16 ENCOUNTER — Telehealth: Payer: Self-pay | Admitting: Cardiovascular Disease

## 2022-08-16 ENCOUNTER — Other Ambulatory Visit: Payer: PPO

## 2022-08-16 NOTE — Telephone Encounter (Signed)
Patient calling the office for samples of medication:   1.  What medication and dosage are you requesting samples for?   apixaban (ELIQUIS) 5 MG TABS tablet    2.  Are you currently out of this medication? Almost   

## 2022-08-16 NOTE — Telephone Encounter (Signed)
LMTCB

## 2022-08-16 NOTE — Telephone Encounter (Signed)
Patient informed pharmd approved eliquis '5mg'$  samples. Patient stated he will pick up med in AM.

## 2022-08-16 NOTE — Telephone Encounter (Signed)
Patient calling for samples of eliquis '5mg'$  twice daily. Please advise if dose is correct.

## 2022-08-18 DIAGNOSIS — H52203 Unspecified astigmatism, bilateral: Secondary | ICD-10-CM | POA: Diagnosis not present

## 2022-08-18 DIAGNOSIS — Z961 Presence of intraocular lens: Secondary | ICD-10-CM | POA: Diagnosis not present

## 2022-08-18 DIAGNOSIS — E119 Type 2 diabetes mellitus without complications: Secondary | ICD-10-CM | POA: Diagnosis not present

## 2022-08-18 LAB — HM DIABETES EYE EXAM

## 2022-08-30 ENCOUNTER — Other Ambulatory Visit: Payer: Self-pay | Admitting: Physician Assistant

## 2022-08-30 DIAGNOSIS — E1169 Type 2 diabetes mellitus with other specified complication: Secondary | ICD-10-CM

## 2022-09-04 ENCOUNTER — Encounter (HOSPITAL_BASED_OUTPATIENT_CLINIC_OR_DEPARTMENT_OTHER): Payer: PPO | Attending: General Surgery | Admitting: General Surgery

## 2022-09-04 DIAGNOSIS — Z951 Presence of aortocoronary bypass graft: Secondary | ICD-10-CM | POA: Diagnosis not present

## 2022-09-04 DIAGNOSIS — E1151 Type 2 diabetes mellitus with diabetic peripheral angiopathy without gangrene: Secondary | ICD-10-CM | POA: Insufficient documentation

## 2022-09-04 DIAGNOSIS — I252 Old myocardial infarction: Secondary | ICD-10-CM | POA: Diagnosis not present

## 2022-09-04 DIAGNOSIS — E11621 Type 2 diabetes mellitus with foot ulcer: Secondary | ICD-10-CM | POA: Diagnosis not present

## 2022-09-04 DIAGNOSIS — E11622 Type 2 diabetes mellitus with other skin ulcer: Secondary | ICD-10-CM | POA: Insufficient documentation

## 2022-09-04 DIAGNOSIS — M1990 Unspecified osteoarthritis, unspecified site: Secondary | ICD-10-CM | POA: Diagnosis not present

## 2022-09-04 DIAGNOSIS — Z8546 Personal history of malignant neoplasm of prostate: Secondary | ICD-10-CM | POA: Insufficient documentation

## 2022-09-04 DIAGNOSIS — I4891 Unspecified atrial fibrillation: Secondary | ICD-10-CM | POA: Diagnosis not present

## 2022-09-04 DIAGNOSIS — F17210 Nicotine dependence, cigarettes, uncomplicated: Secondary | ICD-10-CM | POA: Insufficient documentation

## 2022-09-04 DIAGNOSIS — I872 Venous insufficiency (chronic) (peripheral): Secondary | ICD-10-CM | POA: Insufficient documentation

## 2022-09-04 DIAGNOSIS — L97812 Non-pressure chronic ulcer of other part of right lower leg with fat layer exposed: Secondary | ICD-10-CM | POA: Diagnosis not present

## 2022-09-04 DIAGNOSIS — S81801A Unspecified open wound, right lower leg, initial encounter: Secondary | ICD-10-CM | POA: Diagnosis not present

## 2022-09-04 DIAGNOSIS — I10 Essential (primary) hypertension: Secondary | ICD-10-CM | POA: Diagnosis not present

## 2022-09-04 DIAGNOSIS — I2510 Atherosclerotic heart disease of native coronary artery without angina pectoris: Secondary | ICD-10-CM | POA: Diagnosis not present

## 2022-09-04 DIAGNOSIS — E785 Hyperlipidemia, unspecified: Secondary | ICD-10-CM | POA: Diagnosis not present

## 2022-09-06 NOTE — Progress Notes (Signed)
Marcus, [name] ([id number]) . Visit Report for 09/04/2022 Arrival Information Details Patient Name: Date of Service: [name]. 09/04/2022 8:45 A M Medical Record Number: [id number] Patient Account Number: [id number] Date of Birth/Sex: Treating RN: 07-14-1951 (71 y.o. Waldron Session Primary Care Aaryav Hopfensperger: [name] Other Clinician: Referring Kinley Ferrentino: Treating Eamonn Sermeno/Extender: Casandra Doffing in Treatment: 20 Visit Information History Since Last Visit All ordered tests and consults were completed: Yes Patient Arrived: Ambulatory Added or deleted any medications: No Arrival Time: 08:23 Any new allergies or adverse reactions: No Accompanied By: self Had a fall or experienced change in No Transfer Assistance: None activities of daily living that may affect Patient Requires Transmission-Based Precautions: No risk of falls: Patient Has Alerts: Yes Signs or symptoms of abuse/neglect since last visito No Patient Alerts: Patient on Blood Thinner Hospitalized since last visit: No ABI R 1.11 Implantable device outside of the clinic excluding No ABI L 0.72 cellular tissue based products placed in the center TBI L 0.33 since last visit: Has Dressing in Place as Prescribed: Yes Pain Present Now: No Electronic Signature(s) Signed: 09/06/2022 4:45:05 PM By: Blanche East RN Entered By: Blanche East on 09/04/2022 08:24:00 -------------------------------------------------------------------------------- Compression Therapy Details Patient Name: Date of Service: Marcus Milliner T. 09/04/2022 8:45 A M Medical Record Number: [id number] Patient Account Number: [id number] Date of Birth/Sex: Treating RN: February 21, 1951 (71 y.o. Waldron Session Primary Care Khristie Sak: [name] Other Clinician: Referring Tyniah Kastens: Treating Quinnetta Roepke/Extender: [name] Weeks in Treatment: 20 Compression Therapy Performed for Wound Assessment:  Wound #6 Right,Medial Lower Leg Performed By: Clinician Blanche East, RN Post Procedure Diagnosis Same as Pre-procedure Electronic Signature(s) Signed: 09/06/2022 4:45:05 PM By: Blanche East RN Entered By: Blanche East on 09/04/2022 09:03:32 -------------------------------------------------------------------------------- Encounter Discharge Information Details Patient Name: Date of Service: MO [name], [name] 09/04/2022 8:45 A M Medical Record Number: [id number] Patient Account Number: [id number] Date of Birth/Sex: Treating RN: 10-Mar-1951 (71 y.o. Waldron Session Primary Care [name]: [name] Other Clinician: Referring Giovanna Kemmerer: Treating Joyia Riehle/Extender: Casandra Doffing in Treatment: 20 Encounter Discharge Information Items Post Procedure Vitals Discharge Condition: Stable Temperature (F): 97.9 Ambulatory Status: Ambulatory Pulse (bpm): 91 Discharge Destination: Home Respiratory Rate (breaths/min): 18 Transportation: Private Auto Blood Pressure (mmHg): 134/79 Accompanied By: self Schedule Follow-up Appointment: No Clinical Summary of Care: Electronic Signature(s) Signed: 09/06/2022 4:45:05 PM By: Blanche East RN Entered By: Blanche East on 09/04/2022 09:02:57 -------------------------------------------------------------------------------- Lower Extremity Assessment Details Patient Name: Date of Service: [name] 09/04/2022 8:45 A M Medical Record Number: [id number] Patient Account Number: [id number] Date of Birth/Sex: Treating RN: Aug 16, 1951 (71 y.o. Waldron Session Primary Care Skyleen Bentley: [name] Other Clinician: Referring Thu Baggett: Treating Saragrace Selke/Extender: [name] Weeks in Treatment: 20 Edema Assessment Assessed: [Left: No] [Right: No] Edema: [Left: Ye] [Right: s] Calf Left: Right: Point of Measurement: 34 cm From Medial Instep 41.1 cm Ankle Left: Right: Point of  Measurement: 12 cm From Medial Instep 26.7 cm Vascular Assessment Pulses: Dorsalis Pedis Palpable: [Right:Yes] Electronic Signature(s) Signed: 09/06/2022 4:45:05 PM By: Blanche East RN Entered By: Blanche East on 09/04/2022 08:25:00 -------------------------------------------------------------------------------- Multi Wound Chart Details Patient Name: Date of Service: [name], [name] 09/04/2022 8:45 A M Medical Record Number: [id number] Patient Account Number: [id number] Date of Birth/Sex: Treating RN: [birth year] (71 y.o. Waldron Session Primary Care Krystine Pabst: [name] Other Clinician: Referring Markice Torbert: Treating Arelene Moroni/Extender: Casandra Doffing  in Treatment: 20 Vital Signs Height(in): 70 Pulse(bpm): 91 Weight(lbs): 219 Blood Pressure(mmHg): 134/79 Body Mass Index(BMI): 31.4 Temperature(F): 97.9 Respiratory Rate(breaths/min): 18 Photos: [N/A:N/A] Right, Medial Lower Leg N/A N/A Wound Location: Trauma N/A N/A Wounding Event: Trauma, Other N/A N/A Primary Etiology: Cataracts, Arrhythmia, Coronary N/A N/A Comorbid History: Artery Disease, Hypertension, Myocardial Infarction, Peripheral Arterial Disease, Peripheral Venous Disease, Type II Diabetes, Osteoarthritis, Neuropathy, Received Radiation 08/25/2022 N/A N/A Date Acquired: 0 N/A N/A Weeks of Treatment: Open N/A N/A Wound Status: No N/A N/A Wound Recurrence: 0.4x1x0.1 N/A N/A Measurements L x W x D (cm) 0.314 N/A N/A A (cm) : rea 0.031 N/A N/A Volume (cm) : 0.00% N/A N/A % Reduction in A rea: 0.00% N/A N/A % Reduction in Volume: Full Thickness Without Exposed N/A N/A Classification: Support Structures Medium N/A N/A Exudate A mount: Serosanguineous N/A N/A Exudate Type: red, brown N/A N/A Exudate Color: Medium (34-66%) N/A N/A Granulation A mount: Pink N/A N/A Granulation Quality: Medium (34-66%) N/A N/A Necrotic A mount: Fat Layer (Subcutaneous  Tissue): Yes N/A N/A Exposed Structures: Fascia: No Tendon: No Muscle: No Joint: No Bone: No Medium (34-66%) N/A N/A Epithelialization: Debridement - Selective/Open Wound N/A N/A Debridement: Pre-procedure Verification/Time Out 08:36 N/A N/A Taken: Lidocaine 5% topical ointment N/A N/A Pain Control: Slough N/A N/A Tissue Debrided: Non-Viable Tissue N/A N/A Level: 0.4 N/A N/A Debridement A (sq cm): rea Curette N/A N/A Instrument: Minimum N/A N/A Bleeding: Pressure N/A N/A Hemostasis A chieved: 0 N/A N/A Procedural Pain: 0 N/A N/A Post Procedural Pain: Procedure was tolerated well N/A N/A Debridement Treatment Response: 0.4x1x0.1 N/A N/A Post Debridement Measurements L x W x D (cm) 0.031 N/A N/A Post Debridement Volume: (cm) Debridement N/A N/A Procedures Performed: Treatment Notes Electronic Signature(s) Signed: 09/04/2022 8:40:45 AM By: Fredirick Maudlin MD FACS Signed: 09/06/2022 4:45:05 PM By: Blanche East RN Entered By: Fredirick Maudlin on 09/04/2022 08:40:45 -------------------------------------------------------------------------------- Multi-Disciplinary Care Plan Details Patient Name: Date of Service: MO [name], [name] 09/04/2022 8:45 A M Medical Record Number: [id number] Patient Account Number: [id number] Date of Birth/Sex: Treating RN: [birth year] (71 y.o. Waldron Session Primary Care Brooklin Rieger: [name] Other Clinician: Referring Brandan Robicheaux: Treating Yoshi Mancillas/Extender: [name] Weeks in Treatment: 20 Active Inactive Wound/Skin Impairment Nursing Diagnoses: Impaired tissue integrity Goals: Ulcer/skin breakdown will have a volume reduction of 30% by week 4 Date Initiated: 09/04/2022 Target Resolution Date: 10/02/2022 Goal Status: Active Interventions: Assess ulceration(s) every visit Treatment Activities: Skin care regimen initiated : 04/17/2022 Notes: Electronic Signature(s) Signed: 09/06/2022 4:45:05 PM By:  Blanche East RN Entered By: Blanche East on 09/04/2022 08:30:58 -------------------------------------------------------------------------------- Pain Assessment Details Patient Name: Date of Service: Marcus Milliner T. 09/04/2022 8:45 A M Medical Record Number: [id number] Patient Account Number: [id number] Date of Birth/Sex: Treating RN: 05/22/1951 (71 y.o. Waldron Session Primary Care Nyeisha Goodall: [name] Other Clinician: Referring Helio Lack: Treating Horace Lukas/Extender: [name] Weeks in Treatment: 20 Active Problems Location of Pain Severity and Description of Pain Patient Has Paino No Site Locations Pain Management and Medication Current Pain Management: Electronic Signature(s) Signed: 09/06/2022 4:45:05 PM By: Blanche East RN Entered By: Blanche East on 09/04/2022 08:24:33 -------------------------------------------------------------------------------- Patient/Caregiver Education Details Patient Name: Date of Service: [name] 9/11/2023andnbsp8:45 A M Medical Record Number: [id number] Patient Account Number: [id number] Date of Birth/Gender: Treating RN: [birth year] (71 y.o. Waldron Session Primary Care Physician: [name] Other Clinician: Referring Physician: Treating Physician/Extender: Casandra Doffing in Treatment: 20 Education  Assessment Education Provided To: Patient Education Topics Provided Wound/Skin Impairment: Methods: Explain/Verbal Responses: Reinforcements needed, State content correctly Electronic Signature(s) Signed: 09/06/2022 4:45:05 PM By: Blanche East RN Entered By: Blanche East on 09/04/2022 08:31:12 -------------------------------------------------------------------------------- Wound Assessment Details Patient Name: Date of Service: Marcus Milliner T. 09/04/2022 8:45 A M Medical Record Number: [id number] Patient Account Number: [id number] Date of  Birth/Sex: Treating RN: 01-11-1951 (72 y.o. Waldron Session Primary Care Claudie Rathbone: [name] Other Clinician: Referring Josefine Fuhr: Treating Oyindamola Key/Extender: [name] Weeks in Treatment: 20 Wound Status Wound Number: 6 Primary Trauma, Other Etiology: Wound Location: Right, Medial Lower Leg Wound Open Wounding Event: Trauma Status: Date Acquired: 08/25/2022 Comorbid Cataracts, Arrhythmia, Coronary Artery Disease, Hypertension, Weeks Of Treatment: 0 History: Myocardial Infarction, Peripheral Arterial Disease, Peripheral Venous Clustered Wound: No Disease, Type II Diabetes, Osteoarthritis, Neuropathy, Received Radiation Photos Wound Measurements Length: (cm) 0.4 Width: (cm) 1 Depth: (cm) 0.1 Area: (cm) 0.314 Volume: (cm) 0.031 % Reduction in Area: 0% % Reduction in Volume: 0% Epithelialization: Medium (34-66%) Tunneling: No Undermining: No Wound Description Classification: Full Thickness Without Exposed Support Structures Exudate Amount: Medium Exudate Type: Serosanguineous Exudate Color: red, brown Foul Odor After Cleansing: No Slough/Fibrino Yes Wound Bed Granulation Amount: Medium (34-66%) Exposed Structure Granulation Quality: Pink Fascia Exposed: No Necrotic Amount: Medium (34-66%) Fat Layer (Subcutaneous Tissue) Exposed: Yes Tendon Exposed: No Muscle Exposed: No Joint Exposed: No Bone Exposed: No Treatment Notes Wound #6 (Lower Leg) Wound Laterality: Right, Medial Cleanser Soap and Water Discharge Instruction: May shower and wash wound with dial antibacterial soap and water prior to dressing change. Peri-Wound Care Zinc Oxide Ointment 30g tube Discharge Instruction: Apply Zinc Oxide to periwound with each dressing change Topical Primary Dressing KerraCel Ag Gelling Fiber Dressing, 2x2 in (silver alginate) Discharge Instruction: Apply silver alginate to wound bed as instructed Secondary Dressing Zetuvit Plus 4x8  in Discharge Instruction: Apply over primary dressing as directed. Secured With 9M Medipore H Soft Cloth Surgical T ape, 4 x 10 (in/yd) Discharge Instruction: Secure with tape as directed. Compression Wrap ThreePress (3 layer compression wrap) Discharge Instruction: Apply three layer compression as directed. Compression Stockings Add-Ons Electronic Signature(s) Signed: 09/06/2022 4:45:05 PM By: Blanche East RN Entered By: Blanche East on 09/04/2022 08:29:39 -------------------------------------------------------------------------------- Vitals Details Patient Name: Date of Service: [place] [name], [name] 09/04/2022 8:45 A M Medical Record Number: [id number] Patient Account Number: [id number] Date of Birth/Sex: Treating RN: May 29, 1951 (71 y.o. Waldron Session Primary Care Alver Leete: [name] Other Clinician: Referring Sujey Gundry: Treating Eathan Groman/Extender: [name] Weeks in Treatment: 20 Vital Signs Time Taken: 08:24 Temperature (F): 97.9 Height (in): 70 Pulse (bpm): 91 Weight (lbs): 219 Respiratory Rate (breaths/min): 18 Body Mass Index (BMI): 31.4 Blood Pressure (mmHg): 134/79 Reference Range: 80 - 120 mg / dl Electronic Signature(s) Signed: 09/06/2022 4:45:05 PM By: Blanche East RN Entered By: Blanche East on 09/04/2022 08:24:25

## 2022-09-06 NOTE — Progress Notes (Signed)
SIRIUS, WOODFORD ([id number]) . Visit Report for 09/04/2022 Chief Complaint Document Details Patient Name: Date of Service: [name]. 09/04/2022 8:45 A M Medical Record Number: [id number] Patient Account Number: [id number] Date of Birth/Sex: Treating RN: 02-25-1951 (71 y.o. Waldron Session Primary Care Provider: [name] Other Clinician: Referring Provider: Treating Provider/Extender: Casandra Doffing in Treatment: 20 Information Obtained from: Patient Chief Complaint Patient seen for complaints of Non-Healing Wounds. Electronic Signature(s) Signed: 09/04/2022 8:40:52 AM By: Fredirick Maudlin MD FACS Entered By: Fredirick Maudlin on 09/04/2022 08:40:52 -------------------------------------------------------------------------------- Debridement Details Patient Name: Date of Service: [name], [name] 09/04/2022 8:45 A M Medical Record Number: [id number] Patient Account Number: [id number] Date of Birth/Sex: Treating RN: Nov 14, 1951 (71 y.o. Waldron Session Primary Care Provider: [name] Other Clinician: Referring Provider: Treating Provider/Extender: Casandra Doffing in Treatment: 20 Debridement Performed for Assessment: Wound #6 Right,Medial Lower Leg Performed By: Physician Fredirick Maudlin, MD Debridement Type: Debridement Level of Consciousness (Pre-procedure): Awake and Alert Pre-procedure Verification/Time Out Yes - 08:36 Taken: Start Time: 08:37 Pain Control: Lidocaine 5% topical ointment T Area Debrided (L x W): otal 0.4 (cm) x 1 (cm) = 0.4 (cm) Tissue and other material debrided: Slough, Slough Level: Non-Viable Tissue Debridement Description: Selective/Open Wound Instrument: Curette Bleeding: Minimum Hemostasis Achieved: Pressure Procedural Pain: 0 Post Procedural Pain: 0 Response to Treatment: Procedure was tolerated well Level of Consciousness (Post- Awake and Alert procedure): Post  Debridement Measurements of Total Wound Length: (cm) 0.4 Width: (cm) 1 Depth: (cm) 0.1 Volume: (cm) 0.031 Character of Wound/Ulcer Post Debridement: Requires Further Debridement Post Procedure Diagnosis Same as Pre-procedure Notes Scribing for Dr. Celine Ahr by Blanche East, RN. Electronic Signature(s) Signed: 09/04/2022 12:16:04 PM By: Fredirick Maudlin MD FACS Signed: 09/06/2022 4:45:05 PM By: Blanche East RN Previous Signature: 09/04/2022 9:27:17 AM Version By: Fredirick Maudlin MD FACS Entered By: Blanche East on 09/04/2022 10:31:46 -------------------------------------------------------------------------------- HPI Details Patient Name: Date of Service: [name], [name] 09/04/2022 8:45 A M Medical Record Number: [id number] Patient Account Number: [id number] Date of Birth/Sex: Treating RN: [birth year] (71 y.o. Waldron Session Primary Care Provider: [name] Other Clinician: Referring Provider: Treating Provider/Extender: Casandra Doffing in Treatment: 20 History of Present Illness HPI Description: 11/10/2020 patient presents today for initial evaluation in this clinic although I have seen this patient in [place] previous. Subsequently his issue today is different from when I saw him for I was treating him for a toe ulcer. Currently he is having issues with bilateral lower extremity ulcers after running into a metal bar. Upon inspection today he has wounds of the bilateral lower extremities which are consistent with having struck his legs on the anterior aspect. With that being said the patient did go to the ER for evaluation on October 11 where he was given Keflex initially. He went back to the ER after not improving on October 20 he was given Lasix at that point he states the Lasix seem to help the most. Has been using Neosporin and leaving this open area which is probably not the best way to go either to be honest. He has had arterial studies on March  2021 which showed a left ABI of 0.78 with a TBI of 0.34 and a right ABI of 1.18 with a TBI 1.18. Nonetheless his arterial flow the left is not ideal but also I think potentially consistent with allowing this to heal. I think he can also support  light compression with Kerlix and Coban based on this result. His most recent hemoglobin A1c that we could find was 7.3 and that was towards the end of 2020. The patient does have a history of heart disease unfortunately. He is also currently still a smoker. 11/24/2020 on evaluation today patient appears to be doing well with regard to his wounds. In fact both appear to be doing better after last week's rather last visit's debridement. Fortunately there is no signs of active infection at this time. No fevers, chills, nausea, vomiting, or diarrhea. Overall I am very pleased with where things stand today. 12/01/2020 on evaluation today patient appears to be doing well with regard to his leg ulcers. He has been tolerating the dressing changes without complication. Fortunately there is no signs of active infection at this time. No fevers, chills, nausea, vomiting, or diarrhea. 12/08/2020 on evaluation today patient appears to be doing very well in regard to his bilateral lower extremity ulcers. They seem to be doing excellent and he is making great progress. There does not appear to be any evidence of active infection at this time. No fevers, chills, nausea, vomiting, or diarrhea. 12/15/2020 upon evaluation today patient actually appears to be doing excellent in regard to his wounds. Has been tolerating the dressing changes without complication. Fortunately his wounds are showing signs of great improvement were using Hydrofera Blue. 12/29/2020 on evaluation today patient appears to be doing well with regard to his right leg which is completely healed. His left leg is also measuring smaller than not healed this is doing very well. Fortunately there is no signs of active  infection at this time 01/05/2021 upon evaluation today patient actually appears to be making signs of improvement with regard to his left leg. I am very pleased with how things are progressing. There is no evidence of active infection at this time. No fevers, chills, nausea, vomiting, or diarrhea. Patient is extremely happy with the fact that this is doing so well 01/12/2021 upon evaluation today patient appears to be doing excellent in regard to his leg ulcer. Fortunately there is no signs of active infection at this time. No fevers, chills, nausea, vomiting, or diarrhea. He has been tolerating the dressing changes without complication. 01/19/2021 upon evaluation today patient appears to be doing well with regard to his wound. In fact this appears to be completely healed on the left leg. This is brand-new skin but overall seems to be doing quite well which is great news. No fevers, chills, nausea, vomiting, or diarrhea. READMISSION 04/17/2022 This is a patient has been seen on a number of occasions in the wound care center. He is 71 years old and has type 2 diabetes mellitus, coronary artery disease, chronic venous insufficiency, and peripheral arterial disease with recent vascular studies demonstrating a right TBI of 0.9 (normal) and a left great toe TBI of 0.33. 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 He recently dropped an object and it hit him in the left shin. He then bumped the same leg against his wagon. He now has 2 wounds on his left anterior lower leg. He says that he applied Aquaphor to keep the areas moist and made an appointment in the wound care center for follow-up. 04/24/2022: Today, he is here with what he describes as 10 out of 10 pain in his wounds. Both of them measured a little bit larger today, but this may be secondary to the debridement that was performed last week. He says that  due to it being spring season, he is on his feet quite a bit more. The leg is red  and warm. We have been using silver alginate with Kerlix and Coban wraps. 05/01/2022: PCR culture taken last week grew out MRSA. I prescribed doxycycline. He has been taking this. Both wounds are a bit smaller today and somewhat less painful. They continue to accumulate a bit of slough. 05/08/2022: He has completed his course of oral doxycycline. We have been using mupirocin under silver alginate with Kerlix and Coban wrapping. Today, both wounds are a bit smaller but have accumulated slough on the surface. They are less tender. 05/23/2022: Both wounds are quite a bit more shallow today. There is a little bit of slough accumulation, but there is also good granulation tissue as well as some buds of epithelialized tissue coming through the wound. No surrounding erythema, induration, nor any purulent drainage. We have been using mupirocin with silver alginate and Kerlix/Coban wrapping. 05/29/2022: The more anterior wound continues to demonstrate encroaching epithelium and the remaining open portion has good granulation tissue. The more lateral wound is nearly closed with just a pinpoint opening. No concern for infection. 06/05/2022: The lateral wound has healed. The more anterior wound is much smaller today with just a little bit of surrounding eschar. The wound surface is healthy-appearing with good granulation tissue. 06/13/2022: The wound on the anterior tibial surface on the left is nearly closed. There is just a tiny opening underneath some eschar. Unfortunately, his dog jumped up on him and cut his right lower leg. There is a flap of nonviable skin with some hematoma and bruising. No surrounding erythema or induration. No purulent discharge. 06/19/2022: The wound on his left leg is closed. The right sided wound is smaller, but there is significant periwound erythema and the leg is more tender. There is slough accumulation on the wound surface. 06/26/2022: The right sided wound is about the same. It is  tender but there is less periwound erythema. He still has slough on the wound surface. He has been taking Bactrim as prescribed. 07/03/2022: The PCR culture that I took demonstrated bacteria not sensitive to Bactrim so he was prescribed levofloxacin, as supported by the culture data. He has been taking that and there has been significant improvement in his degree of pain. The wound itself is smaller with less slough accumulation. There is good granulation tissue emerging. 07/10/2022: The wound has contracted substantially and there is a lot of epithelialized tissue budding through. There is a small amount of slough on the surface. No concern for infection. 07/17/2022: The wound continues to contract and is down to just a fairly small open lesion. There is slough on the wound surface but underneath, the granulation tissue is healthy. No concern for infection. 07/24/2022: The wound is down to just a small slit. There is just a light layer of slough and some perimeter eschar present. 07/31/2022: The wound continues to contract. There is just some eschar overlying the surface. The underlying granulation tissue is robust. 08/07/2022: His wound has healed. READMISSION 09/04/2022 He returns today after suffering a laceration from his dog's toenail on his right medial lower leg. He says he has been trying to care for it at home but it has been draining so much fluid that he cannot keep up. No fevers or chills. No purulent discharge or odor. The laceration is about a centimeter and has some slough buildup in the base. Electronic Signature(s) Signed: 09/04/2022 8:42:03 AM By: Fredirick Maudlin MD  FACS Entered By: Fredirick Maudlin on 09/04/2022 08:42:02 -------------------------------------------------------------------------------- Physical Exam Details Patient Name: Date of Service: [name] 09/04/2022 8:45 A M Medical Record Number: [id number] Patient Account Number: [id number] Date of Birth/Sex:  Treating RN: November 26, 1951 (71 y.o. Waldron Session Primary Care Provider: [name] Other Clinician: Referring Provider: Treating Provider/Extender: [name] Weeks in Treatment: 20 Constitutional . . . . No acute distress.. Cardiovascular . 2+ pitting edema bilaterally. Notes 09/04/2022: No purulent discharge or odor. The laceration is about a centimeter and has some slough buildup in the base. Electronic Signature(s) Signed: 09/04/2022 8:59:41 AM By: Fredirick Maudlin MD FACS Entered By: Fredirick Maudlin on 09/04/2022 08:59:40 -------------------------------------------------------------------------------- Physician Orders Details Patient Name: Date of Service: [name], [name] 09/04/2022 8:45 A M Medical Record Number: [id number] Patient Account Number: [id number] Date of Birth/Sex: Treating RN: [birth year] (71 y.o. Waldron Session Primary Care Provider: [name] Other Clinician: Referring Provider: Treating Provider/Extender: Casandra Doffing in Treatment: 20 Verbal / Phone Orders: No Diagnosis Coding ICD-10 Coding Code Description E11.622 Type 2 diabetes mellitus with other skin ulcer I73.9 Peripheral vascular disease, unspecified I87.2 Venous insufficiency (chronic) (peripheral) L97.812 Non-pressure chronic ulcer of other part of right lower leg with fat layer exposed Follow-up Appointments ppointment in 1 week. - Dr. Celine Ahr Rm 4 Return A Monday 09/11/22 at 8:00 AM Anesthetic (In clinic) Topical Lidocaine 5% applied to wound bed - in clinic, prior to debridement Bathing/ Shower/ Hygiene May shower with protection but do not get wound dressing(s) wet. - Use cast protector to keep dressing dry. Can purchase at [facility] or [facility] Edema Control - Lymphedema / SCD / Other Bilateral Lower Extremities Elevate legs to the level of the heart or above for 30 minutes daily and/or when sitting, a frequency of: Avoid  standing for long periods of time. Patient to wear own compression stockings every day. - On left leg Exercise regularly Moisturize legs daily. Wound Treatment Wound #6 - Lower Leg Wound Laterality: Right, Medial Cleanser: Soap and Water Discharge Instructions: May shower and wash wound with dial antibacterial soap and water prior to dressing change. Peri-Wound Care: Zinc Oxide Ointment 30g tube Discharge Instructions: Apply Zinc Oxide to periwound with each dressing change Prim Dressing: KerraCel Ag Gelling Fiber Dressing, 2x2 in (silver alginate) ary Discharge Instructions: Apply silver alginate to wound bed as instructed Secondary Dressing: Zetuvit Plus 4x8 in Discharge Instructions: Apply over primary dressing as directed. Secured With: 41M Medipore H Soft Cloth Surgical T ape, 4 x 10 (in/yd) Discharge Instructions: Secure with tape as directed. Compression Wrap: ThreePress (3 layer compression wrap) Discharge Instructions: Apply three layer compression as directed. Patient Medications llergies: bee venom protein (honey bee), penicillin A Notifications Medication Indication Start End prior to debridement 09/04/2022 lidocaine DOSE topical 5 % cream - cream topical Electronic Signature(s) Signed: 09/04/2022 9:27:17 AM By: Fredirick Maudlin MD FACS Signed: 09/04/2022 9:27:17 AM By: Fredirick Maudlin MD FACS Entered By: Fredirick Maudlin on 09/04/2022 09:02:22 Prescription 09/04/2022 -------------------------------------------------------------------------------- [name] Fredirick Maudlin MD Patient Name: Provider: September 03, 1951 [id number] Date of Birth: NPI#: [name] [id number] Sex: DEA #: [phone] Phone #: License #: [place] Patient Address: [address] [place], [place] [address] [place], [place] [address] [phone] Allergies bee venom protein (honey bee);  penicillin Medication Medication: Route: Strength: Form: lidocaine 5 % topical cream topical 5% cream Class: HEMORRHOIDALS, LOCAL RECTAL ANESTHETICS Dose:  Frequency / Time: Indication: cream topical prior to debridement Number of Refills: Number of Units: 0 Generic Substitution: Start Date: End Date: One Time Use: Substitution Permitted 4/40/1027 No Note to Pharmacy: Hand Signature: Date(s): Electronic Signature(s) Signed: 09/04/2022 9:27:17 AM By: Fredirick Maudlin MD FACS Entered By: Fredirick Maudlin on 09/04/2022 09:02:22 -------------------------------------------------------------------------------- Problem List Details Patient Name: Date of Service: [name], [name] 09/04/2022 8:45 A M Medical Record Number: [id number] Patient Account Number: [id number] Date of Birth/Sex: Treating RN: Mar 19, 1951 (71 y.o. Waldron Session Primary Care Provider: [name] Other Clinician: Referring Provider: Treating Provider/Extender: [name] Weeks in Treatment: 20 Active Problems ICD-10 Encounter Code Description Active Date MDM Diagnosis E11.622 Type 2 diabetes mellitus with other skin ulcer 04/17/2022 No Yes I73.9 Peripheral vascular disease, unspecified 04/17/2022 No Yes I87.2 Venous insufficiency (chronic) (peripheral) 04/17/2022 No Yes L97.812 Non-pressure chronic ulcer of other part of right lower leg with fat layer 09/04/2022 No Yes exposed Inactive Problems Resolved Problems ICD-10 Code Description Active Date Resolved Date L97.822 Non-pressure chronic ulcer of other part of left lower leg with fat layer exposed 04/17/2022 04/17/2022 Electronic Signature(s) Signed: 09/04/2022 8:40:38 AM By: Fredirick Maudlin MD FACS Entered By: Fredirick Maudlin on 09/04/2022 08:40:38 -------------------------------------------------------------------------------- Progress Note Details Patient Name: Date of Service: [name], [name] 09/04/2022 8:45 A  M Medical Record Number: [id number] Patient Account Number: [id number] Date of Birth/Sex: Treating RN: [birth year] (71 y.o. Waldron Session Primary Care Provider: [name] Other Clinician: Referring Provider: Treating Provider/Extender: Casandra Doffing in Treatment: 20 Subjective Chief Complaint Information obtained from Patient Patient seen for complaints of Non-Healing Wounds. History of Present Illness (HPI) 11/10/2020 patient presents today for initial evaluation in this clinic although I have seen this patient in [place] previous. Subsequently his issue today is different from when I saw him for I was treating him for a toe ulcer. Currently he is having issues with bilateral lower extremity ulcers after running into a metal bar. Upon inspection today he has wounds of the bilateral lower extremities which are consistent with having struck his legs on the anterior aspect. With that being said the patient did go to the ER for evaluation on October 11 where he was given Keflex initially. He went back to the ER after not improving on October 20 he was given Lasix at that point he states the Lasix seem to help the most. Has been using Neosporin and leaving this open area which is probably not the best way to go either to be honest. He has had arterial studies on March 2021 which showed a left ABI of 0.78 with a TBI of 0.34 and a right ABI of 1.18 with a TBI 1.18. Nonetheless his arterial flow the left is not ideal but also I think potentially consistent with allowing this to heal. I think he can also support light compression with Kerlix and Coban based on this result. His most recent hemoglobin A1c that we could find was 7.3 and that was towards the end of 2020. The patient does have a history of heart disease unfortunately. He is also currently still a smoker. 11/24/2020 on evaluation today patient appears to be doing well with regard to his wounds. In fact both  appear to be doing better after last week's rather last visit's debridement. Fortunately there is no signs of active infection at this time. No fevers, chills, nausea, vomiting, or diarrhea. Overall I am very pleased with where things stand today.  12/01/2020 on evaluation today patient appears to be doing well with regard to his leg ulcers. He has been tolerating the dressing changes without complication. Fortunately there is no signs of active infection at this time. No fevers, chills, nausea, vomiting, or diarrhea. 12/08/2020 on evaluation today patient appears to be doing very well in regard to his bilateral lower extremity ulcers. They seem to be doing excellent and he is making great progress. There does not appear to be any evidence of active infection at this time. No fevers, chills, nausea, vomiting, or diarrhea. 12/15/2020 upon evaluation today patient actually appears to be doing excellent in regard to his wounds. Has been tolerating the dressing changes without complication. Fortunately his wounds are showing signs of great improvement were using Hydrofera Blue. 12/29/2020 on evaluation today patient appears to be doing well with regard to his right leg which is completely healed. His left leg is also measuring smaller than not healed this is doing very well. Fortunately there is no signs of active infection at this time 01/05/2021 upon evaluation today patient actually appears to be making signs of improvement with regard to his left leg. I am very pleased with how things are progressing. There is no evidence of active infection at this time. No fevers, chills, nausea, vomiting, or diarrhea. Patient is extremely happy with the fact that this is doing so well 01/12/2021 upon evaluation today patient appears to be doing excellent in regard to his leg ulcer. Fortunately there is no signs of active infection at this time. No fevers, chills, nausea, vomiting, or diarrhea. He has been tolerating the  dressing changes without complication. 01/19/2021 upon evaluation today patient appears to be doing well with regard to his wound. In fact this appears to be completely healed on the left leg. This is brand-new skin but overall seems to be doing quite well which is great news. No fevers, chills, nausea, vomiting, or diarrhea. READMISSION 04/17/2022 This is a patient has been seen on a number of occasions in the wound care center. He is 71 years old and has type 2 diabetes mellitus, coronary artery disease, chronic venous insufficiency, and peripheral arterial disease with recent vascular studies demonstrating a right TBI of 0.9 (normal) and a left great toe TBI of 0.33. 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 He recently dropped an object and it hit him in the left shin. He then bumped the same leg against his wagon. He now has 2 wounds on his left anterior lower leg. He says that he applied Aquaphor to keep the areas moist and made an appointment in the wound care center for follow-up. 04/24/2022: Today, he is here with what he describes as 10 out of 10 pain in his wounds. Both of them measured a little bit larger today, but this may be secondary to the debridement that was performed last week. He says that due to it being spring season, he is on his feet quite a bit more. The leg is red and warm. We have been using silver alginate with Kerlix and Coban wraps. 05/01/2022: PCR culture taken last week grew out MRSA. I prescribed doxycycline. He has been taking this. Both wounds are a bit smaller today and somewhat less painful. They continue to accumulate a bit of slough. 05/08/2022: He has completed his course of oral doxycycline. We have been using mupirocin under silver alginate with Kerlix and Coban wrapping. Today, both wounds are a bit smaller but have accumulated slough on the surface.  They are less tender. 05/23/2022: Both wounds are quite a bit more shallow today. There is a little  bit of slough accumulation, but there is also good granulation tissue as well as some buds of epithelialized tissue coming through the wound. No surrounding erythema, induration, nor any purulent drainage. We have been using mupirocin with silver alginate and Kerlix/Coban wrapping. 05/29/2022: The more anterior wound continues to demonstrate encroaching epithelium and the remaining open portion has good granulation tissue. The more lateral wound is nearly closed with just a pinpoint opening. No concern for infection. 06/05/2022: The lateral wound has healed. The more anterior wound is much smaller today with just a little bit of surrounding eschar. The wound surface is healthy-appearing with good granulation tissue. 06/13/2022: The wound on the anterior tibial surface on the left is nearly closed. There is just a tiny opening underneath some eschar. Unfortunately, his dog jumped up on him and cut his right lower leg. There is a flap of nonviable skin with some hematoma and bruising. No surrounding erythema or induration. No purulent discharge. 06/19/2022: The wound on his left leg is closed. The right sided wound is smaller, but there is significant periwound erythema and the leg is more tender. There is slough accumulation on the wound surface. 06/26/2022: The right sided wound is about the same. It is tender but there is less periwound erythema. He still has slough on the wound surface. He has been taking Bactrim as prescribed. 07/03/2022: The PCR culture that I took demonstrated bacteria not sensitive to Bactrim so he was prescribed levofloxacin, as supported by the culture data. He has been taking that and there has been significant improvement in his degree of pain. The wound itself is smaller with less slough accumulation. There is good granulation tissue emerging. 07/10/2022: The wound has contracted substantially and there is a lot of epithelialized tissue budding through. There is a small amount of  slough on the surface. No concern for infection. 07/17/2022: The wound continues to contract and is down to just a fairly small open lesion. There is slough on the wound surface but underneath, the granulation tissue is healthy. No concern for infection. 07/24/2022: The wound is down to just a small slit. There is just a light layer of slough and some perimeter eschar present. 07/31/2022: The wound continues to contract. There is just some eschar overlying the surface. The underlying granulation tissue is robust. 08/07/2022: His wound has healed. READMISSION 09/04/2022 He returns today after suffering a laceration from his dog's toenail on his right medial lower leg. He says he has been trying to care for it at home but it has been draining so much fluid that he cannot keep up. No fevers or chills. No purulent discharge or odor. The laceration is about a centimeter and has some slough buildup in the base. Patient History Information obtained from Patient. Social History Current every day smoker - 1 PPD, Marital Status - Married, Alcohol Use - Rarely, Drug Use - No History, Caffeine Use - Daily - coffee, soda. Medical History Eyes Patient has history of Cataracts - both eyes Cardiovascular Patient has history of Arrhythmia - A-fib, Coronary Artery Disease, Hypertension, Myocardial Infarction - CABG x 4, Peripheral Arterial Disease, Peripheral Venous Disease Endocrine Patient has history of Type II Diabetes Musculoskeletal Patient has history of Osteoarthritis Neurologic Patient has history of Neuropathy Oncologic Patient has history of Received Radiation Medical A Surgical History Notes nd Cardiovascular Hyperlipidemia, CVA, AAA Oncologic Prostate cancer Objective Constitutional No acute  distress.. Vitals Time Taken: 8:24 AM, Height: 70 in, Weight: 219 lbs, BMI: 31.4, Temperature: 97.9 F, Pulse: 91 bpm, Respiratory Rate: 18 breaths/min, Blood Pressure: 134/79  mmHg. Cardiovascular 2+ pitting edema bilaterally. General Notes: 09/04/2022: No purulent discharge or odor. The laceration is about a centimeter and has some slough buildup in the base. Integumentary (Hair, Skin) Wound #6 status is Open. Original cause of wound was Trauma. The date acquired was: 08/25/2022. The wound is located on the Right,Medial Lower Leg. The wound measures 0.4cm length x 1cm width x 0.1cm depth; 0.314cm^2 area and 0.031cm^3 volume. There is Fat Layer (Subcutaneous Tissue) exposed. There is no tunneling or undermining noted. There is a medium amount of serosanguineous drainage noted. There is medium (34-66%) pink granulation within the wound bed. There is a medium (34-66%) amount of necrotic tissue within the wound bed. Assessment Active Problems ICD-10 Type 2 diabetes mellitus with other skin ulcer Peripheral vascular disease, unspecified Venous insufficiency (chronic) (peripheral) Non-pressure chronic ulcer of other part of right lower leg with fat layer exposed Procedures Wound #6 Pre-procedure diagnosis of Wound #6 is a Trauma, Other located on the Right,Medial Lower Leg . There was a Selective/Open Wound Non-Viable Tissue Debridement with a total area of 0.4 sq cm performed by Fredirick Maudlin, MD. With the following instrument(s): Curette Material removed includes [facility] after achieving pain control using Lidocaine 5% topical ointment. A time out was conducted at 08:36, prior to the start of the procedure. A Minimum amount of bleeding was controlled with Pressure. The procedure was tolerated well with a pain level of 0 throughout and a pain level of 0 following the procedure. Post Debridement Measurements: 0.4cm length x 1cm width x 0.1cm depth; 0.031cm^3 volume. Character of Wound/Ulcer Post Debridement requires further debridement. Post procedure Diagnosis Wound #6: Same as Pre-Procedure Plan Follow-up Appointments: Return Appointment in 1 week. - Dr. Celine Ahr Rm 4  Monday 09/11/22 at 8:00 AM Anesthetic: (In clinic) Topical Lidocaine 5% applied to wound bed - in clinic, prior to debridement Bathing/ Shower/ Hygiene: May shower with protection but do not get wound dressing(s) wet. - Use cast protector to keep dressing dry. Can purchase at [facility] or [facility] Edema Control - Lymphedema / SCD / Other: Elevate legs to the level of the heart or above for 30 minutes daily and/or when sitting, a frequency of: Avoid standing for long periods of time. Patient to wear own compression stockings every day. - On left leg Exercise regularly Moisturize legs daily. The following medication(s) was prescribed: lidocaine topical 5 % cream cream topical for prior to debridement was prescribed at facility WOUND #6: - Lower Leg Wound Laterality: Right, Medial Cleanser: Soap and Water Discharge Instructions: May shower and wash wound with dial antibacterial soap and water prior to dressing change. Peri-Wound Care: Zinc Oxide Ointment 30g tube Discharge Instructions: Apply Zinc Oxide to periwound with each dressing change Prim Dressing: KerraCel Ag Gelling Fiber Dressing, 2x2 in (silver alginate) ary Discharge Instructions: Apply silver alginate to wound bed as instructed Secondary Dressing: Zetuvit Plus 4x8 in Discharge Instructions: Apply over primary dressing as directed. Secured With: 33M Medipore H Soft Cloth Surgical T ape, 4 x 10 (in/yd) Discharge Instructions: Secure with tape as directed. Com pression Wrap: ThreePress (3 layer compression wrap) Discharge Instructions: Apply three layer compression as directed. 09/04/2022: The patient returns today with a new laceration from his dog's toenail. No purulent discharge or odor. The laceration is about a centimeter and has some slough buildup in the base. I  used a curette debride slough from the wound. We will use some silver alginate and 3 layer compression. Follow-up in 1 week. Electronic Signature(s) Signed: 09/04/2022  9:08:09 AM By: Fredirick Maudlin MD FACS Previous Signature: 09/04/2022 9:02:39 AM Version By: Fredirick Maudlin MD FACS Entered By: Fredirick Maudlin on 09/04/2022 09:08:08 -------------------------------------------------------------------------------- HxROS Details Patient Name: Date of Service: [name], [name] 09/04/2022 8:45 A M Medical Record Number: [id number] Patient Account Number: [id number] Date of Birth/Sex: Treating RN: [birth year] (71 y.o. Waldron Session Primary Care Provider: [name] Other Clinician: Referring Provider: Treating Provider/Extender: [name] Weeks in Treatment: 20 Information Obtained From Patient Eyes Medical History: Positive for: Cataracts - both eyes Cardiovascular Medical History: Positive for: Arrhythmia - A-fib; Coronary Artery Disease; Hypertension; Myocardial Infarction - CABG x 4; Peripheral Arterial Disease; Peripheral Venous Disease Past Medical History Notes: Hyperlipidemia, CVA, AAA Endocrine Medical History: Positive for: Type II Diabetes Time with diabetes: over 5 years Treated with: Oral agents Blood sugar tested every day: No Musculoskeletal Medical History: Positive for: Osteoarthritis Neurologic Medical History: Positive for: Neuropathy Oncologic Medical History: Positive for: Received Radiation Past Medical History Notes: Prostate cancer HBO Extended History Items Eyes: Cataracts Immunizations Pneumococcal Vaccine: Received Pneumococcal Vaccination: Yes Received Pneumococcal Vaccination On or After 60th Birthday: Yes Implantable Devices None Family and Social History Current every day smoker - 1 PPD; Marital Status - Married; Alcohol Use: Rarely; Drug Use: No History; Caffeine Use: Daily - coffee, soda; Financial Concerns: No; Food, Clothing or Shelter Needs: No; Support System Lacking: No; Transportation Concerns: No Electronic Signature(s) Signed: 09/04/2022 9:27:17 AM By:  Fredirick Maudlin MD FACS Signed: 09/06/2022 4:45:05 PM By: Blanche East RN Entered By: Fredirick Maudlin on 09/04/2022 08:42:09 -------------------------------------------------------------------------------- SuperBill Details Patient Name: Date of Service: [name], [name] 09/04/2022 Medical Record Number: [id number] Patient Account Number: [id number] Date of Birth/Sex: Treating RN: 09-11-1951 (71 y.o. Waldron Session Primary Care Provider: [name] Other Clinician: Referring Provider: Treating Provider/Extender: [name] Weeks in Treatment: 20 Diagnosis Coding ICD-10 Codes Code Description E11.622 Type 2 diabetes mellitus with other skin ulcer I73.9 Peripheral vascular disease, unspecified I87.2 Venous insufficiency (chronic) (peripheral) L97.812 Non-pressure chronic ulcer of other part of right lower leg with fat layer exposed Facility Procedures CPT4 Code: 62836629 Description: [phone] - DEBRIDE WOUND 1ST 20 SQ CM OR < ICD-10 Diagnosis Description L97.812 Non-pressure chronic ulcer of other part of right lower leg with fat layer expos Modifier: ed Quantity: 1 Physician Procedures : CPT4 Code Description Modifier [id number] - WC PHYS LEVEL 4 - EST PT 25 ICD-10 Diagnosis Description X51.700 Non-pressure chronic ulcer of other part of right lower leg with fat layer exposed E11.622 Type 2 diabetes mellitus with other skin ulcer  I73.9 Peripheral vascular disease, unspecified I87.2 Venous insufficiency (chronic) (peripheral) Quantity: 1 Electronic Signature(s) Signed: 09/04/2022 9:09:30 AM By: Fredirick Maudlin MD FACS Entered By: Fredirick Maudlin on 09/04/2022 09:09:30

## 2022-09-11 ENCOUNTER — Encounter (HOSPITAL_BASED_OUTPATIENT_CLINIC_OR_DEPARTMENT_OTHER): Payer: PPO | Admitting: General Surgery

## 2022-09-11 DIAGNOSIS — S81801A Unspecified open wound, right lower leg, initial encounter: Secondary | ICD-10-CM | POA: Diagnosis not present

## 2022-09-11 DIAGNOSIS — E11622 Type 2 diabetes mellitus with other skin ulcer: Secondary | ICD-10-CM | POA: Diagnosis not present

## 2022-09-11 NOTE — Progress Notes (Signed)
LENELL, LAMA ([id number]) . Visit Report for 09/11/2022 Arrival Information Details Patient Name: Date of Service: [name]. 09/11/2022 8:00 A M Medical Record Number: [id number] Patient Account Number: [id number] Date of Birth/Sex: Treating RN: [birth year] (71 y.o. Waldron Session Primary Care Shelise Maron: [name] Other Clinician: Referring Railee Bonillas: Treating Sharnette Kitamura/Extender: Casandra Doffing in Treatment: 21 Visit Information History Since Last Visit All ordered tests and consults were completed: Yes Patient Arrived: Ambulatory Added or deleted any medications: No Arrival Time: 08:01 Any new allergies or adverse reactions: No Accompanied By: slef Had a fall or experienced change in No Transfer Assistance: None activities of daily living that may affect Patient Requires Transmission-Based Precautions: No risk of falls: Patient Has Alerts: Yes Signs or symptoms of abuse/neglect since last visito No Patient Alerts: Patient on Blood Thinner Hospitalized since last visit: No ABI R 1.11 Implantable device outside of the clinic excluding No ABI L 0.72 cellular tissue based products placed in the center TBI L 0.33 since last visit: Has Dressing in Place as Prescribed: Yes Has Compression in Place as Prescribed: Yes Pain Present Now: Yes Electronic Signature(s) Signed: 09/11/2022 5:27:19 PM By: Blanche East RN Entered By: Blanche East on 09/11/2022 08:05:48 -------------------------------------------------------------------------------- Compression Therapy Details Patient Name: Date of Service: [name], Marcus [name] 09/11/2022 8:00 [place] Record Number: [id number] Patient Account Number: [id number] Date of Birth/Sex: Treating RN: Aug 31, 1951 (71 y.o. Waldron Session Primary Care Thurston Brendlinger: [name] Other Clinician: Referring Rockie Schnoor: Treating Deardra Hinkley/Extender: Casandra Doffing in Treatment:  21 Compression Therapy Performed for Wound Assessment: Wound #6 Right,Medial Lower Leg Performed By: Clinician Blanche East, RN Compression Type: Three Layer Post Procedure Diagnosis Same as Pre-procedure Electronic Signature(s) Signed: 09/11/2022 5:27:19 PM By: Blanche East RN Entered By: Blanche East on 09/11/2022 08:36:43 -------------------------------------------------------------------------------- Encounter Discharge Information Details Patient Name: Date of Service: [name], Marcus [name] 09/11/2022 8:00 A M Medical Record Number: [id number] Patient Account Number: [id number] Date of Birth/Sex: Treating RN: 15-Jun-1951 (71 y.o. Waldron Session Primary Care Sacheen Arrasmith: [name] Other Clinician: Referring Molly Maselli: Treating Rubert Frediani/Extender: Casandra Doffing in Treatment: 21 Encounter Discharge Information Items Post Procedure Vitals Discharge Condition: Stable Temperature (F): 97.8 Ambulatory Status: Ambulatory Pulse (bpm): 69 Discharge Destination: Home Respiratory Rate (breaths/min): 16 Transportation: Private Auto Blood Pressure (mmHg): 115/74 Accompanied By: self Schedule Follow-up Appointment: Yes Clinical Summary of Care: Electronic Signature(s) Signed: 09/11/2022 5:27:19 PM By: Blanche East RN Entered By: Blanche East on 09/11/2022 08:37:22 -------------------------------------------------------------------------------- Lower Extremity Assessment Details Patient Name: Date of Service: [name], Marcus [name] 09/11/2022 8:00 A M Medical Record Number: [id number] Patient Account Number: [id number] Date of Birth/Sex: Treating RN: 1951/01/13 (71 y.o. Waldron Session Primary Care Raine Elsass: [name] Other Clinician: Referring Enrica Corliss: Treating Jeniah Kishi/Extender: [name] Weeks in Treatment: 21 Edema Assessment Assessed: [Left: No] [Right: No] Edema: [Left: Ye] [Right: s] Calf Left: Right: Point of  Measurement: 34 cm From Medial Instep 38 cm Ankle Left: Right: Point of Measurement: 12 cm From Medial Instep 24.5 cm Vascular Assessment Pulses: Dorsalis Pedis Palpable: [Right:Yes] Electronic Signature(s) Signed: 09/11/2022 5:27:19 PM By: Blanche East RN Entered By: Blanche East on 09/11/2022 08:11:29 -------------------------------------------------------------------------------- Multi Wound Chart Details Patient Name: Date of Service: [name], Marcus [name] 09/11/2022 8:00 A M Medical Record Number: [id number] Patient Account Number: [id number] Date of Birth/Sex: Treating RN: 1951-07-06 (71 y.o. M) Primary Care [place]: [name] Other Clinician:  Referring Delron Comer: Treating Jadaya Sommerfield/Extender: Casandra Doffing in Treatment: 21 Vital Signs Height(in): 70 Pulse(bpm): 66 Weight(lbs): 219 Blood Pressure(mmHg): 115/74 Body Mass Index(BMI): 31.4 Temperature(F): 97.8 Respiratory Rate(breaths/min): 16 Photos: [N/A:N/A] Right, Medial Lower Leg N/A N/A Wound Location: Trauma N/A N/A Wounding Event: Trauma, Other N/A N/A Primary Etiology: Cataracts, Arrhythmia, Coronary N/A N/A Comorbid History: Artery Disease, Hypertension, Myocardial Infarction, Peripheral Arterial Disease, Peripheral Venous Disease, Type II Diabetes, Osteoarthritis, Neuropathy, Received Radiation 08/25/2022 N/A N/A Date Acquired: 1 N/A N/A Weeks of Treatment: Open N/A N/A Wound Status: No N/A N/A Wound Recurrence: 0.4x0.5x0.1 N/A N/A Measurements L x W x D (cm) 0.157 N/A N/A A (cm) : rea 0.016 N/A N/A Volume (cm) : 50.00% N/A N/A % Reduction in A rea: 48.40% N/A N/A % Reduction in Volume: Full Thickness Without Exposed N/A N/A Classification: Support Structures Medium N/A N/A Exudate A mount: Serosanguineous N/A N/A Exudate Type: red, brown N/A N/A Exudate Color: Medium (34-66%) N/A N/A Granulation A mount: Pink N/A N/A Granulation Quality: Medium  (34-66%) N/A N/A Necrotic A mount: Fat Layer (Subcutaneous Tissue): Yes N/A N/A Exposed Structures: Fascia: No Tendon: No Muscle: No Joint: No Bone: No Medium (34-66%) N/A N/A Epithelialization: Debridement - Selective/Open Wound N/A N/A Debridement: Pre-procedure Verification/Time Out 08:20 N/A N/A Taken: Slough N/A N/A Tissue Debrided: Non-Viable Tissue N/A N/A Level: 0.2 N/A N/A Debridement A (sq cm): rea Curette N/A N/A Instrument: Minimum N/A N/A Bleeding: Pressure N/A N/A Hemostasis A chieved: 0 N/A N/A Procedural Pain: 0 N/A N/A Post Procedural Pain: Procedure was tolerated well N/A N/A Debridement Treatment Response: 0.4x0.5x0.1 N/A N/A Post Debridement Measurements L x W x D (cm) 0.016 N/A N/A Post Debridement Volume: (cm) Debridement N/A N/A Procedures Performed: Treatment Notes Electronic Signature(s) Signed: 09/11/2022 8:22:25 AM By: Fredirick Maudlin MD FACS Entered By: Fredirick Maudlin on 09/11/2022 08:22:25 -------------------------------------------------------------------------------- Multi-Disciplinary Care Plan Details Patient Name: Date of Service: [name], Marcus [name] 09/11/2022 8:00 A M Medical Record Number: [id number] Patient Account Number: [id number] Date of Birth/Sex: Treating RN: 02-Jan-1951 (71 y.o. Waldron Session Primary Care Karlina Suares: [name] Other Clinician: Referring Reynoldo Mainer: Treating Johnnay Pleitez/Extender: [name] Weeks in Treatment: 21 Active Inactive Wound/Skin Impairment Nursing Diagnoses: Impaired tissue integrity Goals: Ulcer/skin breakdown will have a volume reduction of 30% by week 4 Date Initiated: 09/04/2022 Target Resolution Date: 10/02/2022 Goal Status: Active Interventions: Assess ulceration(s) every visit Treatment Activities: Skin care regimen initiated : 04/17/2022 Notes: Electronic Signature(s) Signed: 09/11/2022 5:27:19 PM By: Blanche East RN Entered By: Blanche East  on 09/11/2022 08:35:54 -------------------------------------------------------------------------------- Pain Assessment Details Patient Name: Date of Service: [name], Marcus [name] 09/11/2022 8:00 A M Medical Record Number: [id number] Patient Account Number: [id number] Date of Birth/Sex: Treating RN: [birth year] (71 y.o. Waldron Session Primary Care Melvia Matousek: [name] Other Clinician: Referring Brewster Wolters: Treating Prerana Strayer/Extender: Casandra Doffing in Treatment: 21 Active Problems Location of Pain Severity and Description of Pain Patient Has Paino Yes Site Locations Rate the pain. Rate the pain. Current Pain Level: 6 Character of Pain Describe the Pain: Aching Pain Management and Medication Current Pain Management: Electronic Signature(s) Signed: 09/11/2022 5:27:19 PM By: Blanche East RN Entered By: Blanche East on 09/11/2022 08:06:23 -------------------------------------------------------------------------------- Patient/Caregiver Education Details Patient Name: Date of Service: Marcus O DY, [name] 9/18/2023andnbsp8:00 [place] Record Number: [id number] Patient Account Number: [id number] Date of Birth/Gender: Treating RN: 21-Apr-1951 (71 y.o. Waldron Session Primary Care Physician: [name] Other Clinician: Referring Physician: Treating  Physician/Extender: [name] Weeks in Treatment: 21 Education Assessment Education Provided To: Patient Education Topics Provided Wound/Skin Impairment: Methods: Explain/Verbal Responses: Reinforcements needed, State content correctly Electronic Signature(s) Signed: 09/11/2022 5:27:19 PM By: Blanche East RN Entered By: Blanche East on 09/11/2022 08:18:13 -------------------------------------------------------------------------------- Wound Assessment Details Patient Name: Date of Service: [name], Marcus [name] 09/11/2022 8:00 A M Medical Record Number:  [id number] Patient Account Number: [id number] Date of Birth/Sex: Treating RN: 07/18/1951 (71 y.o. Waldron Session Primary Care Branda Chaudhary: Other Clinician: [name] Referring Karnell Vanderloop: Treating Other Atienza/Extender: [name] Weeks in Treatment: 21 Wound Status Wound Number: 6 Primary Trauma, Other Etiology: Wound Location: Right, Medial Lower Leg Wound Open Wounding Event: Trauma Status: Date Acquired: 08/25/2022 Comorbid Cataracts, Arrhythmia, Coronary Artery Disease, Hypertension, Weeks Of Treatment: 1 History: Myocardial Infarction, Peripheral Arterial Disease, Peripheral Venous Clustered Wound: No Disease, Type II Diabetes, Osteoarthritis, Neuropathy, Received Radiation Photos Wound Measurements Length: (cm) 0.4 Width: (cm) 0.5 Depth: (cm) 0.1 Area: (cm) 0.157 Volume: (cm) 0.016 % Reduction in Area: 50% % Reduction in Volume: 48.4% Epithelialization: Medium (34-66%) Tunneling: No Undermining: No Wound Description Classification: Full Thickness Without Exposed Support Structures Exudate Amount: Medium Exudate Type: Serosanguineous Exudate Color: red, brown Foul Odor After Cleansing: No Slough/Fibrino Yes Wound Bed Granulation Amount: Medium (34-66%) Exposed Structure Granulation Quality: Pink Fascia Exposed: No Necrotic Amount: Medium (34-66%) Fat Layer (Subcutaneous Tissue) Exposed: Yes Necrotic Quality: Adherent Slough Tendon Exposed: No Muscle Exposed: No Joint Exposed: No Bone Exposed: No Treatment Notes Wound #6 (Lower Leg) Wound Laterality: Right, Medial Cleanser Soap and Water Discharge Instruction: May shower and wash wound with dial antibacterial soap and water prior to dressing change. Peri-Wound Care Zinc Oxide Ointment 30g tube Discharge Instruction: Apply Zinc Oxide to periwound with each dressing change Topical Primary Dressing KerraCel Ag Gelling Fiber Dressing, 2x2 in (silver alginate) Discharge Instruction:  Apply silver alginate to wound bed as instructed Secondary Dressing Zetuvit Plus 4x8 in Discharge Instruction: Apply over primary dressing as directed. Secured With 71M Medipore H Soft Cloth Surgical T ape, 4 x 10 (in/yd) Discharge Instruction: Secure with tape as directed. Compression Wrap ThreePress (3 layer compression wrap) Discharge Instruction: Apply three layer compression as directed. Compression Stockings Add-Ons Electronic Signature(s) Signed: 09/11/2022 5:27:19 PM By: Blanche East RN Entered By: Blanche East on 09/11/2022 15:83:09 -------------------------------------------------------------------------------- Vitals Details Patient Name: Date of Service: [name], Marcus [name] 09/11/2022 8:00 A M Medical Record Number: [id number] Patient Account Number: [id number] Date of Birth/Sex: Treating RN: [birth year] (71 y.o. Waldron Session Primary Care Kayanna Mckillop: [name] Other Clinician: Referring Brodan Grewell: Treating Kristoff Coonradt/Extender: Casandra Doffing in Treatment: 21 Vital Signs Time Taken: 08:05 Temperature (F): 97.8 Height (in): 70 Pulse (bpm): 69 Weight (lbs): 219 Respiratory Rate (breaths/min): 16 Body Mass Index (BMI): 31.4 Blood Pressure (mmHg): 115/74 Reference Range: 80 - 120 mg / dl Electronic Signature(s) Signed: 09/11/2022 5:27:19 PM By: Blanche East RN Entered By: Blanche East on 09/11/2022 10:31:59

## 2022-09-11 NOTE — Progress Notes (Signed)
[name], [name] ([id number]) . Visit Report for 09/11/2022 Chief Complaint Document Details Patient Name: Date of Service: [name]. 09/11/2022 8:00 A M Medical Record Number: [id number] Patient Account Number: [id number] Date of Birth/Sex: Treating RN: May 27, 1951 (71 y.o. M) Primary Care Provider: [name] Other Clinician: Referring Provider: Treating Provider/Extender: Casandra Doffing in Treatment: 21 Information Obtained from: Patient Chief Complaint Patient seen for complaints of Non-Healing Wounds. Electronic Signature(s) Signed: 09/11/2022 8:22:39 AM By: Fredirick Maudlin MD FACS Entered By: Fredirick Maudlin on 09/11/2022 08:22:39 -------------------------------------------------------------------------------- Debridement Details Patient Name: Date of Service: [name], [name]. 09/11/2022 8:00 A M Medical Record Number: [id number] Patient Account Number: [id number] Date of Birth/Sex: Treating RN: [birth year] (71 y.o. Waldron Session Primary Care Provider: [name] Other Clinician: Referring Provider: Treating Provider/Extender: Casandra Doffing in Treatment: 21 Debridement Performed for Assessment: Wound #6 Right,Medial Lower Leg Performed By: Physician Fredirick Maudlin, MD Debridement Type: Debridement Level of Consciousness (Pre-procedure): Awake and Alert Pre-procedure Verification/Time Out Yes - 08:20 Taken: Start Time: 08:21 [name] Area Debrided (L x W): otal 0.4 (cm) x 0.5 (cm) = 0.2 (cm) Tissue and other material debrided: [place] Level: Non-Viable Tissue Debridement Description: Selective/Open Wound Instrument: Curette Bleeding: Minimum Hemostasis Achieved: Pressure Procedural Pain: 0 Post Procedural Pain: 0 Response to Treatment: Procedure was tolerated well Level of Consciousness (Post- Awake and Alert procedure): Post Debridement Measurements of Total Wound Length: (cm) 0.4 Width:  (cm) 0.5 Depth: (cm) 0.1 Volume: (cm) 0.016 Character of Wound/Ulcer Post Debridement: Improved Post Procedure Diagnosis Same as Pre-procedure Notes Scribed for Dr. Celine Ahr by Blanche East, RN Electronic Signature(s) Signed: 09/11/2022 9:44:59 AM By: Fredirick Maudlin MD FACS Signed: 09/11/2022 5:27:19 PM By: Blanche East RN Previous Signature: 09/11/2022 8:27:13 AM Version By: Fredirick Maudlin MD FACS Entered By: Blanche East on 09/11/2022 08:36:26 -------------------------------------------------------------------------------- HPI Details Patient Name: Date of Service: [name], [name]. 09/11/2022 8:00 A M Medical Record Number: [id number] Patient Account Number: [id number] Date of Birth/Sex: Treating RN: 1951/02/04 (71 y.o. M) Primary Care Provider: [name] Other Clinician: Referring Provider: Treating Provider/Extender: Casandra Doffing in Treatment: 21 History of Present Illness HPI Description: 11/10/2020 patient presents today for initial evaluation in this clinic although I have seen this patient in [place] previous. Subsequently his issue today is different from when I saw him for I was treating him for a toe ulcer. Currently he is having issues with bilateral lower extremity ulcers after running into a metal bar. Upon inspection today he has wounds of the bilateral lower extremities which are consistent with having struck his legs on the anterior aspect. With that being said the patient did go to the ER for evaluation on October 11 where he was given Keflex initially. He went back to the ER after not improving on October 20 he was given Lasix at that point he states the Lasix seem to help the most. Has been using Neosporin and leaving this open area which is probably not the best way to go either to be honest. He has had arterial studies on March 2021 which showed a left ABI of 0.78 with a TBI of 0.34 and a right ABI of 1.18 with a TBI 1.18.  Nonetheless his arterial flow the left is not ideal but also I think potentially consistent with allowing this to heal. I think he can also support light compression with Kerlix and Coban based on this result. His most  recent hemoglobin A1c that we could find was 7.3 and that was towards the end of 2020. The patient does have a history of heart disease unfortunately. He is also currently still a smoker. 11/24/2020 on evaluation today patient appears to be doing well with regard to his wounds. In fact both appear to be doing better after last week's rather last visit's debridement. Fortunately there is no signs of active infection at this time. No fevers, chills, nausea, vomiting, or diarrhea. Overall I am very pleased with where things stand today. 12/01/2020 on evaluation today patient appears to be doing well with regard to his leg ulcers. He has been tolerating the dressing changes without complication. Fortunately there is no signs of active infection at this time. No fevers, chills, nausea, vomiting, or diarrhea. 12/08/2020 on evaluation today patient appears to be doing very well in regard to his bilateral lower extremity ulcers. They seem to be doing excellent and he is making great progress. There does not appear to be any evidence of active infection at this time. No fevers, chills, nausea, vomiting, or diarrhea. 12/15/2020 upon evaluation today patient actually appears to be doing excellent in regard to his wounds. Has been tolerating the dressing changes without complication. Fortunately his wounds are showing signs of great improvement were using Hydrofera Blue. 12/29/2020 on evaluation today patient appears to be doing well with regard to his right leg which is completely healed. His left leg is also measuring smaller than not healed this is doing very well. Fortunately there is no signs of active infection at this time 01/05/2021 upon evaluation today patient actually appears to be making  signs of improvement with regard to his left leg. I am very pleased with how things are progressing. There is no evidence of active infection at this time. No fevers, chills, nausea, vomiting, or diarrhea. Patient is extremely happy with the fact that this is doing so well 01/12/2021 upon evaluation today patient appears to be doing excellent in regard to his leg ulcer. Fortunately there is no signs of active infection at this time. No fevers, chills, nausea, vomiting, or diarrhea. He has been tolerating the dressing changes without complication. 01/19/2021 upon evaluation today patient appears to be doing well with regard to his wound. In fact this appears to be completely healed on the left leg. This is brand-new skin but overall seems to be doing quite well which is great news. No fevers, chills, nausea, vomiting, or diarrhea. READMISSION 04/17/2022 This is a patient has been seen on a number of occasions in the wound care center. He is 71 years old and has type 2 diabetes mellitus, coronary artery disease, chronic venous insufficiency, and peripheral arterial disease with recent vascular studies demonstrating a right TBI of 0.9 (normal) and a left great toe TBI of 0.33. 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 He recently dropped an object and it hit him in the left shin. He then bumped the same leg against his wagon. He now has 2 wounds on his left anterior lower leg. He says that he applied Aquaphor to keep the areas moist and made an appointment in the wound care center for follow-up. 04/24/2022: Today, he is here with what he describes as 10 out of 10 pain in his wounds. Both of them measured a little bit larger today, but this may be secondary to the debridement that was performed last week. He says that due to it being spring season, he is on his feet quite  a bit more. The leg is red and warm. We have been using silver alginate with Kerlix and Coban wraps. 05/01/2022: PCR culture  taken last week grew out MRSA. I prescribed doxycycline. He has been taking this. Both wounds are a bit smaller today and somewhat less painful. They continue to accumulate a bit of slough. 05/08/2022: He has completed his course of oral doxycycline. We have been using mupirocin under silver alginate with Kerlix and Coban wrapping. Today, both wounds are a bit smaller but have accumulated slough on the surface. They are less tender. 05/23/2022: Both wounds are quite a bit more shallow today. There is a little bit of slough accumulation, but there is also good granulation tissue as well as some buds of epithelialized tissue coming through the wound. No surrounding erythema, induration, nor any purulent drainage. We have been using mupirocin with silver alginate and Kerlix/Coban wrapping. 05/29/2022: The more anterior wound continues to demonstrate encroaching epithelium and the remaining open portion has good granulation tissue. The more lateral wound is nearly closed with just a pinpoint opening. No concern for infection. 06/05/2022: The lateral wound has healed. The more anterior wound is much smaller today with just a little bit of surrounding eschar. The wound surface is healthy-appearing with good granulation tissue. 06/13/2022: The wound on the anterior tibial surface on the left is nearly closed. There is just a tiny opening underneath some eschar. Unfortunately, his dog jumped up on him and cut his right lower leg. There is a flap of nonviable skin with some hematoma and bruising. No surrounding erythema or induration. No purulent discharge. 06/19/2022: The wound on his left leg is closed. The right sided wound is smaller, but there is significant periwound erythema and the leg is more tender. There is slough accumulation on the wound surface. 06/26/2022: The right sided wound is about the same. It is tender but there is less periwound erythema. He still has slough on the wound surface. He has  been taking Bactrim as prescribed. 07/03/2022: The PCR culture that I took demonstrated bacteria not sensitive to Bactrim so he was prescribed levofloxacin, as supported by the culture data. He has been taking that and there has been significant improvement in his degree of pain. The wound itself is smaller with less slough accumulation. There is good granulation tissue emerging. 07/10/2022: The wound has contracted substantially and there is a lot of epithelialized tissue budding through. There is a small amount of slough on the surface. No concern for infection. 07/17/2022: The wound continues to contract and is down to just a fairly small open lesion. There is slough on the wound surface but underneath, the granulation tissue is healthy. No concern for infection. 07/24/2022: The wound is down to just a small slit. There is just a light layer of slough and some perimeter eschar present. 07/31/2022: The wound continues to contract. There is just some eschar overlying the surface. The underlying granulation tissue is robust. 08/07/2022: His wound has healed. READMISSION 09/04/2022 He returns today after suffering a laceration from his dog's toenail on his right medial lower leg. He says he has been trying to care for it at home but it has been draining so much fluid that he cannot keep up. No fevers or chills. No purulent discharge or odor. The laceration is about a centimeter and has some slough buildup in the base. 09/11/2022: He still had a bit of drainage from the wound but it was serosanguineous. It is smaller today by about half  with just a little bit of slough in the base. Electronic Signature(s) Signed: 09/11/2022 8:23:20 AM By: Fredirick Maudlin MD FACS Entered By: Fredirick Maudlin on 09/11/2022 08:23:20 -------------------------------------------------------------------------------- Physical Exam Details Patient Name: Date of Service: [name], [name]. 09/11/2022 8:00 A M Medical Record  Number: [id number] Patient Account Number: [id number] Date of Birth/Sex: Treating RN: [birth year] (71 y.o. M) Primary Care Provider: [name] Other Clinician: Referring Provider: Treating Provider/Extender: [name] Weeks in Treatment: 21 Constitutional . . . . No acute distress.Marland Kitchen Respiratory Normal work of breathing on room air.. Notes 09/11/2022: He still had a bit of drainage from the wound but it was serosanguineous. It is smaller today by about half with just a little bit of slough in the base. Electronic Signature(s) Signed: 09/11/2022 8:23:48 AM By: Fredirick Maudlin MD FACS Entered By: Fredirick Maudlin on 09/11/2022 08:23:48 -------------------------------------------------------------------------------- Physician Orders Details Patient Name: Date of Service: [name], [name]. 09/11/2022 8:00 A M Medical Record Number: [id number] Patient Account Number: [id number] Date of Birth/Sex: Treating RN: Mar 17, 1951 (71 y.o. Waldron Session Primary Care Provider: [name] Other Clinician: Referring Provider: Treating Provider/Extender: Casandra Doffing in Treatment: 21 Verbal / Phone Orders: No Diagnosis Coding ICD-10 Coding Code Description E11.622 Type 2 diabetes mellitus with other skin ulcer I73.9 Peripheral vascular disease, unspecified I87.2 Venous insufficiency (chronic) (peripheral) L97.812 Non-pressure chronic ulcer of other part of right lower leg with fat layer exposed Follow-up Appointments ppointment in 1 week. - Dr. Celine Ahr Rm 4 Return A Monday 09/18/22 at 8:00 AM Anesthetic (In clinic) Topical Lidocaine 5% applied to wound bed - in clinic, prior to debridement Bathing/ Shower/ Hygiene May shower with protection but do not get wound dressing(s) wet. - Use cast protector to keep dressing dry. Can purchase at [facility] or [facility] Edema Control - Lymphedema / SCD / Other Bilateral Lower Extremities Elevate legs  to the level of the heart or above for 30 minutes daily and/or when sitting, a frequency of: Avoid standing for long periods of time. Patient to wear own compression stockings every day. - On left leg Exercise regularly Moisturize legs daily. Wound Treatment Wound #6 - Lower Leg Wound Laterality: Right, Medial Cleanser: Soap and Water Discharge Instructions: May shower and wash wound with dial antibacterial soap and water prior to dressing change. Peri-Wound Care: Zinc Oxide Ointment 30g tube Discharge Instructions: Apply Zinc Oxide to periwound with each dressing change Prim Dressing: KerraCel Ag Gelling Fiber Dressing, 2x2 in (silver alginate) ary Discharge Instructions: Apply silver alginate to wound bed as instructed Secondary Dressing: Zetuvit Plus 4x8 in Discharge Instructions: Apply over primary dressing as directed. Secured With: 64M Medipore H Soft Cloth Surgical [name] ape, 4 x 10 (in/yd) Discharge Instructions: Secure with tape as directed. Compression Wrap: ThreePress (3 layer compression wrap) Discharge Instructions: Apply three layer compression as directed. Patient Medications llergies: bee venom protein (honey bee), penicillin A Notifications Medication Indication Start End 09/11/2022 lidocaine DOSE topical 5 % ointment - ointment topical Electronic Signature(s) Signed: 09/11/2022 8:27:13 AM By: Fredirick Maudlin MD FACS Signed: 09/11/2022 8:27:13 AM By: Fredirick Maudlin MD FACS Entered By: Fredirick Maudlin on 09/11/2022 08:24:01 Prescription 09/11/2022 -------------------------------------------------------------------------------- Demetrios Loll [name]. Fredirick Maudlin MD Patient Name: Provider: [birth year] [id number] Date of Birth: NPI#: [name] Sex: DEA #: [phone] Phone #: License #: [place] Patient Address: [facility] [address] [place], [place] [address] [place], [place]  [address] [phone] Allergies bee venom protein (honey bee); penicillin Medication Medication: Route: Strength: Form: lidocaine 5 % topical ointment topical 5% ointment Class: TOPICAL LOCAL ANESTHETICS Dose: Frequency / Time: Indication: ointment topical Number of Refills: Number of Units: 0 Generic Substitution: Start Date: End Date: One Time Use: Substitution Permitted 2/95/2841 No Note to Pharmacy: Hand Signature: Date(s): Electronic Signature(s) Signed: 09/11/2022 8:25:39 AM By: Fredirick Maudlin MD FACS Entered By: Fredirick Maudlin on 09/11/2022 08:25:39 -------------------------------------------------------------------------------- Problem List Details Patient Name: Date of Service: [name], [name]. 09/11/2022 8:00 A M Medical Record Number: [id number] Patient Account Number: [id number] Date of Birth/Sex: Treating RN: 01-28-1951 (71 y.o. M) Primary Care Provider: [name] Other Clinician: Referring Provider: Treating Provider/Extender: Casandra Doffing in Treatment: 21 Active Problems ICD-10 Encounter Code Description Active Date MDM Diagnosis E11.622 Type 2 diabetes mellitus with other skin ulcer 04/17/2022 No Yes I73.9 Peripheral vascular disease, unspecified 04/17/2022 No Yes I87.2 Venous insufficiency (chronic) (peripheral) 04/17/2022 No Yes L97.812 Non-pressure chronic ulcer of other part of right lower leg with fat layer 09/04/2022 No Yes exposed Inactive Problems Resolved Problems ICD-10 Code Description Active Date Resolved Date L97.822 Non-pressure chronic ulcer of other part of left lower leg with fat layer exposed 04/17/2022 04/17/2022 Electronic Signature(s) Signed: 09/11/2022 8:22:18 AM By: Fredirick Maudlin MD FACS Entered By: Fredirick Maudlin on 09/11/2022 08:22:18 -------------------------------------------------------------------------------- Progress Note Details Patient Name: Date of Service: [name], Marcus Negus  [name]. 09/11/2022 8:00 A M Medical Record Number: [id number] Patient Account Number: [id number] Date of Birth/Sex: Treating RN: 1951/03/11 (71 y.o. M) Primary Care Provider: [name] Other Clinician: Referring Provider: Treating Provider/Extender: Casandra Doffing in Treatment: 21 Subjective Chief Complaint Information obtained from Patient Patient seen for complaints of Non-Healing Wounds. History of Present Illness (HPI) 11/10/2020 patient presents today for initial evaluation in this clinic although I have seen this patient in [place] previous. Subsequently his issue today is different from when I saw him for I was treating him for a toe ulcer. Currently he is having issues with bilateral lower extremity ulcers after running into a metal bar. Upon inspection today he has wounds of the bilateral lower extremities which are consistent with having struck his legs on the anterior aspect. With that being said the patient did go to the ER for evaluation on October 11 where he was given Keflex initially. He went back to the ER after not improving on October 20 he was given Lasix at that point he states the Lasix seem to help the most. Has been using Neosporin and leaving this open area which is probably not the best way to go either to be honest. He has had arterial studies on March 2021 which showed a left ABI of 0.78 with a TBI of 0.34 and a right ABI of 1.18 with a TBI 1.18. Nonetheless his arterial flow the left is not ideal but also I think potentially consistent with allowing this to heal. I think he can also support light compression with Kerlix and Coban based on this result. His most recent hemoglobin A1c that we could find was 7.3 and that was towards the end of 2020. The patient does have a history of heart disease unfortunately. He is also currently still a smoker. 11/24/2020 on evaluation today patient appears to be doing well with regard to his wounds. In fact  both appear to be doing better after last week's rather last visit's debridement. Fortunately there is no signs of active infection  at this time. No fevers, chills, nausea, vomiting, or diarrhea. Overall I am very pleased with where things stand today. 12/01/2020 on evaluation today patient appears to be doing well with regard to his leg ulcers. He has been tolerating the dressing changes without complication. Fortunately there is no signs of active infection at this time. No fevers, chills, nausea, vomiting, or diarrhea. 12/08/2020 on evaluation today patient appears to be doing very well in regard to his bilateral lower extremity ulcers. They seem to be doing excellent and he is making great progress. There does not appear to be any evidence of active infection at this time. No fevers, chills, nausea, vomiting, or diarrhea. 12/15/2020 upon evaluation today patient actually appears to be doing excellent in regard to his wounds. Has been tolerating the dressing changes without complication. Fortunately his wounds are showing signs of great improvement were using Hydrofera Blue. 12/29/2020 on evaluation today patient appears to be doing well with regard to his right leg which is completely healed. His left leg is also measuring smaller than not healed this is doing very well. Fortunately there is no signs of active infection at this time 01/05/2021 upon evaluation today patient actually appears to be making signs of improvement with regard to his left leg. I am very pleased with how things are progressing. There is no evidence of active infection at this time. No fevers, chills, nausea, vomiting, or diarrhea. Patient is extremely happy with the fact that this is doing so well 01/12/2021 upon evaluation today patient appears to be doing excellent in regard to his leg ulcer. Fortunately there is no signs of active infection at this time. No fevers, chills, nausea, vomiting, or diarrhea. He has been tolerating  the dressing changes without complication. 01/19/2021 upon evaluation today patient appears to be doing well with regard to his wound. In fact this appears to be completely healed on the left leg. This is brand-new skin but overall seems to be doing quite well which is great news. No fevers, chills, nausea, vomiting, or diarrhea. READMISSION 04/17/2022 This is a patient has been seen on a number of occasions in the wound care center. He is 71 years old and has type 2 diabetes mellitus, coronary artery disease, chronic venous insufficiency, and peripheral arterial disease with recent vascular studies demonstrating a right TBI of 0.9 (normal) and a left great toe TBI of 0.33. 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 He recently dropped an object and it hit him in the left shin. He then bumped the same leg against his wagon. He now has 2 wounds on his left anterior lower leg. He says that he applied Aquaphor to keep the areas moist and made an appointment in the wound care center for follow-up. 04/24/2022: Today, he is here with what he describes as 10 out of 10 pain in his wounds. Both of them measured a little bit larger today, but this may be secondary to the debridement that was performed last week. He says that due to it being spring season, he is on his feet quite a bit more. The leg is red and warm. We have been using silver alginate with Kerlix and Coban wraps. 05/01/2022: PCR culture taken last week grew out MRSA. I prescribed doxycycline. He has been taking this. Both wounds are a bit smaller today and somewhat less painful. They continue to accumulate a bit of slough. 05/08/2022: He has completed his course of oral doxycycline. We have been using mupirocin under silver  alginate with Kerlix and Coban wrapping. Today, both wounds are a bit smaller but have accumulated slough on the surface. They are less tender. 05/23/2022: Both wounds are quite a bit more shallow today. There is a  little bit of slough accumulation, but there is also good granulation tissue as well as some buds of epithelialized tissue coming through the wound. No surrounding erythema, induration, nor any purulent drainage. We have been using mupirocin with silver alginate and Kerlix/Coban wrapping. 05/29/2022: The more anterior wound continues to demonstrate encroaching epithelium and the remaining open portion has good granulation tissue. The more lateral wound is nearly closed with just a pinpoint opening. No concern for infection. 06/05/2022: The lateral wound has healed. The more anterior wound is much smaller today with just a little bit of surrounding eschar. The wound surface is healthy-appearing with good granulation tissue. 06/13/2022: The wound on the anterior tibial surface on the left is nearly closed. There is just a tiny opening underneath some eschar. Unfortunately, his dog jumped up on him and cut his right lower leg. There is a flap of nonviable skin with some hematoma and bruising. No surrounding erythema or induration. No purulent discharge. 06/19/2022: The wound on his left leg is closed. The right sided wound is smaller, but there is significant periwound erythema and the leg is more tender. There is slough accumulation on the wound surface. 06/26/2022: The right sided wound is about the same. It is tender but there is less periwound erythema. He still has slough on the wound surface. He has been taking Bactrim as prescribed. 07/03/2022: The PCR culture that I took demonstrated bacteria not sensitive to Bactrim so he was prescribed levofloxacin, as supported by the culture data. He has been taking that and there has been significant improvement in his degree of pain. The wound itself is smaller with less slough accumulation. There is good granulation tissue emerging. 07/10/2022: The wound has contracted substantially and there is a lot of epithelialized tissue budding through. There is a small  amount of slough on the surface. No concern for infection. 07/17/2022: The wound continues to contract and is down to just a fairly small open lesion. There is slough on the wound surface but underneath, the granulation tissue is healthy. No concern for infection. 07/24/2022: The wound is down to just a small slit. There is just a light layer of slough and some perimeter eschar present. 07/31/2022: The wound continues to contract. There is just some eschar overlying the surface. The underlying granulation tissue is robust. 08/07/2022: His wound has healed. READMISSION 09/04/2022 He returns today after suffering a laceration from his dog's toenail on his right medial lower leg. He says he has been trying to care for it at home but it has been draining so much fluid that he cannot keep up. No fevers or chills. No purulent discharge or odor. The laceration is about a centimeter and has some slough buildup in the base. 09/11/2022: He still had a bit of drainage from the wound but it was serosanguineous. It is smaller today by about half with just a little bit of slough in the base. Patient History Information obtained from Patient. Social History Current every day smoker - 1 PPD, Marital Status - Married, Alcohol Use - Rarely, Drug Use - No History, Caffeine Use - Daily - coffee, soda. Medical History Eyes Patient has history of Cataracts - both eyes Cardiovascular Patient has history of Arrhythmia - A-fib, Coronary Artery Disease, Hypertension, Myocardial Infarction - CABG  x 4, Peripheral Arterial Disease, Peripheral Venous Disease Endocrine Patient has history of Type II Diabetes Musculoskeletal Patient has history of Osteoarthritis Neurologic Patient has history of Neuropathy Oncologic Patient has history of Received Radiation Medical A Surgical History Notes nd Cardiovascular Hyperlipidemia, CVA, AAA Oncologic Prostate cancer Objective Constitutional No acute distress.. Vitals Time  Taken: 8:05 AM, Height: 70 in, Weight: 219 lbs, BMI: 31.4, Temperature: 97.8 F, Pulse: 69 bpm, Respiratory Rate: 16 breaths/min, Blood Pressure: 115/74 mmHg. Respiratory Normal work of breathing on room air.. General Notes: 09/11/2022: He still had a bit of drainage from the wound but it was serosanguineous. It is smaller today by about half with just a little bit of slough in the base. Integumentary (Hair, Skin) Wound #6 status is Open. Original cause of wound was Trauma. The date acquired was: 08/25/2022. The wound has been in treatment 1 weeks. The wound is located on the Right,Medial Lower Leg. The wound measures 0.4cm length x 0.5cm width x 0.1cm depth; 0.157cm^2 area and 0.016cm^3 volume. There is Fat Layer (Subcutaneous Tissue) exposed. There is no tunneling or undermining noted. There is a medium amount of serosanguineous drainage noted. There is medium (34-66%) pink granulation within the wound bed. There is a medium (34-66%) amount of necrotic tissue within the wound bed including Adherent Slough. Assessment Active Problems ICD-10 Type 2 diabetes mellitus with other skin ulcer Peripheral vascular disease, unspecified Venous insufficiency (chronic) (peripheral) Non-pressure chronic ulcer of other part of right lower leg with fat layer exposed Procedures Wound #6 Pre-procedure diagnosis of Wound #6 is a Trauma, Other located on the Right,Medial Lower Leg . There was a Selective/Open Wound Non-Viable Tissue Debridement with a total area of 0.2 sq cm performed by Fredirick Maudlin, MD. With the following instrument(s): Curette Material removed includes [facility]. No specimens were taken. A time out was conducted at 08:20, prior to the start of the procedure. A Minimum amount of bleeding was controlled with Pressure. The procedure was tolerated well with a pain level of 0 throughout and a pain level of 0 following the procedure. Post Debridement Measurements: 0.4cm length x 0.5cm width x  0.1cm depth; 0.016cm^3 volume. Character of Wound/Ulcer Post Debridement is improved. Post procedure Diagnosis Wound #6: Same as Pre-Procedure Plan Follow-up Appointments: Return Appointment in 1 week. - Dr. Celine Ahr Rm 4 Monday 09/18/22 at 8:00 AM Anesthetic: (In clinic) Topical Lidocaine 5% applied to wound bed - in clinic, prior to debridement Bathing/ Shower/ Hygiene: May shower with protection but do not get wound dressing(s) wet. - Use cast protector to keep dressing dry. Can purchase at [facility] or [facility] Edema Control - Lymphedema / SCD / Other: Elevate legs to the level of the heart or above for 30 minutes daily and/or when sitting, a frequency of: Avoid standing for long periods of time. Patient to wear own compression stockings every day. - On left leg Exercise regularly Moisturize legs daily. The following medication(s) was prescribed: lidocaine topical 5 % ointment ointment topical was prescribed at facility WOUND #6: - Lower Leg Wound Laterality: Right, Medial Cleanser: Soap and Water Discharge Instructions: May shower and wash wound with dial antibacterial soap and water prior to dressing change. Peri-Wound Care: Zinc Oxide Ointment 30g tube Discharge Instructions: Apply Zinc Oxide to periwound with each dressing change Prim Dressing: KerraCel Ag Gelling Fiber Dressing, 2x2 in (silver alginate) ary Discharge Instructions: Apply silver alginate to wound bed as instructed Secondary Dressing: Zetuvit Plus 4x8 in Discharge Instructions: Apply over primary dressing as directed. Secured With:  44M Medipore H Soft Cloth Surgical [name] ape, 4 x 10 (in/yd) Discharge Instructions: Secure with tape as directed. Com pression Wrap: ThreePress (3 layer compression wrap) Discharge Instructions: Apply three layer compression as directed. 09/11/2022: He still had a bit of drainage from the wound but it was serosanguineous. It is smaller today by about half with just a little bit of slough in the  base. I used a curette to debride the slough from the wound. We will continue silver alginate and 3 layer compression. Follow-up in 1 week. Electronic Signature(s) Signed: 09/11/2022 8:24:23 AM By: Fredirick Maudlin MD FACS Entered By: Fredirick Maudlin on 09/11/2022 65:68:12 -------------------------------------------------------------------------------- HxROS Details Patient Name: Date of Service: [name], [name]. 09/11/2022 8:00 A M Medical Record Number: [id number] Patient Account Number: [id number] Date of Birth/Sex: Treating RN: July 19, 1951 (71 y.o. M) Primary Care Provider: [name] Other Clinician: Referring Provider: Treating Provider/Extender: Casandra Doffing in Treatment: 21 Information Obtained From Patient Eyes Medical History: Positive for: Cataracts - both eyes Cardiovascular Medical History: Positive for: Arrhythmia - A-fib; Coronary Artery Disease; Hypertension; Myocardial Infarction - CABG x 4; Peripheral Arterial Disease; Peripheral Venous Disease Past Medical History Notes: Hyperlipidemia, CVA, AAA Endocrine Medical History: Positive for: Type II Diabetes Time with diabetes: over 5 years Treated with: Oral agents Blood sugar tested every day: No Musculoskeletal Medical History: Positive for: Osteoarthritis Neurologic Medical History: Positive for: Neuropathy Oncologic Medical History: Positive for: Received Radiation Past Medical History Notes: Prostate cancer HBO Extended History Items Eyes: Cataracts Immunizations Pneumococcal Vaccine: Received Pneumococcal Vaccination: Yes Received Pneumococcal Vaccination On or After 60th Birthday: Yes Implantable Devices None Family and Social History Current every day smoker - 1 PPD; Marital Status - Married; Alcohol Use: Rarely; Drug Use: No History; Caffeine Use: Daily - coffee, soda; Financial Concerns: No; Food, Clothing or Shelter Needs: No; Support System Lacking: No;  Transportation Concerns: No Electronic Signature(s) Signed: 09/11/2022 8:27:13 AM By: Fredirick Maudlin MD FACS Entered By: Fredirick Maudlin on 09/11/2022 08:23:26 -------------------------------------------------------------------------------- SuperBill Details Patient Name: Date of Service: [name], [name]. 09/11/2022 Medical Record Number: [id number] Patient Account Number: [id number] Date of Birth/Sex: Treating RN: 1951/11/08 (71 y.o. M) Primary Care Provider: [name] Other Clinician: Referring Provider: Treating Provider/Extender: Casandra Doffing in Treatment: 21 Diagnosis Coding ICD-10 Codes Code Description E11.622 Type 2 diabetes mellitus with other skin ulcer I73.9 Peripheral vascular disease, unspecified I87.2 Venous insufficiency (chronic) (peripheral) L97.812 Non-pressure chronic ulcer of other part of right lower leg with fat layer exposed Facility Procedures CPT4 Code: [id number] Description: [phone] - DEBRIDE WOUND 1ST 20 SQ CM OR < ICD-10 Diagnosis Description L97.812 Non-pressure chronic ulcer of other part of right lower leg with fat layer expos Modifier: ed Quantity: 1 Physician Procedures : CPT4 Code Description Modifier [id number] - WC PHYS LEVEL 3 - EST PT 25 ICD-10 Diagnosis Description L97.812 Non-pressure chronic ulcer of other part of right lower leg with fat layer exposed I87.2 Venous insufficiency (chronic) (peripheral) I73.9  Peripheral vascular disease, unspecified E11.622 Type 2 diabetes mellitus with other skin ulcer Quantity: 1 : [id number] - WC PHYS DEBR WO ANESTH 20 SQ CM ICD-10 Diagnosis Description U63.335 Non-pressure chronic ulcer of other part of right lower leg with fat layer exposed Quantity: 1 Electronic Signature(s) Signed: 09/11/2022 8:24:47 AM By: Fredirick Maudlin MD FACS Entered By: Fredirick Maudlin on 09/11/2022 08:24:46

## 2022-09-12 DIAGNOSIS — L298 Other pruritus: Secondary | ICD-10-CM | POA: Diagnosis not present

## 2022-09-13 ENCOUNTER — Other Ambulatory Visit: Payer: Self-pay | Admitting: Physician Assistant

## 2022-09-13 DIAGNOSIS — E1142 Type 2 diabetes mellitus with diabetic polyneuropathy: Secondary | ICD-10-CM

## 2022-09-18 ENCOUNTER — Encounter (HOSPITAL_BASED_OUTPATIENT_CLINIC_OR_DEPARTMENT_OTHER): Payer: PPO | Admitting: General Surgery

## 2022-09-18 DIAGNOSIS — S81801A Unspecified open wound, right lower leg, initial encounter: Secondary | ICD-10-CM | POA: Diagnosis not present

## 2022-09-18 NOTE — Progress Notes (Signed)
[name] ([id number]) . Visit Report for 09/18/2022 Chief Complaint Document Details Patient Name: Date of Service: [name]. 09/18/2022 8:00 A M Medical Record Number: [id number] Patient Account Number: [id number] Date of Birth/Sex: Treating RN: 05/20/1951 (71 y.o. M) Primary Care Provider: [name] Other Clinician: Referring Provider: Treating Provider/Extender: Casandra Doffing in Treatment: 22 Information Obtained from: Patient Chief Complaint Patient seen for complaints of Non-Healing Wounds. Electronic Signature(s) Signed: 09/18/2022 8:17:54 AM By: Fredirick Maudlin MD FACS Entered By: Fredirick Maudlin on 09/18/2022 08:17:54 -------------------------------------------------------------------------------- HPI Details Patient Name: Date of Service: Marcus [name], [name] 09/18/2022 8:00 A M Medical Record Number: [id number] Patient Account Number: [id number] Date of Birth/Sex: Treating RN: 09/12/1951 (71 y.o. M) Primary Care Provider: [name] Other Clinician: Referring Provider: Treating Provider/Extender: Casandra Doffing in Treatment: 22 History of Present Illness HPI Description: 11/10/2020 patient presents today for initial evaluation in this clinic although I have seen this patient in [place] previous. Subsequently his issue today is different from when I saw him for I was treating him for a toe ulcer. Currently he is having issues with bilateral lower extremity ulcers after running into a metal bar. Upon inspection today he has wounds of the bilateral lower extremities which are consistent with having struck his legs on the anterior aspect. With that being said the patient did go to the ER for evaluation on October 11 where he was given Keflex initially. He went back to the ER after not improving on October 20 he was given Lasix at that point he states the Lasix seem to help the most. Has been using  Neosporin and leaving this open area which is probably not the best way to go either to be honest. He has had arterial studies on March 2021 which showed a left ABI of 0.78 with a TBI of 0.34 and a right ABI of 1.18 with a TBI 1.18. Nonetheless his arterial flow the left is not ideal but also I think potentially consistent with allowing this to heal. I think he can also support light compression with Kerlix and Coban based on this result. His most recent hemoglobin A1c that we could find was 7.3 and that was towards the end of 2020. The patient does have a history of heart disease unfortunately. He is also currently still a smoker. 11/24/2020 on evaluation today patient appears to be doing well with regard to his wounds. In fact both appear to be doing better after last week's rather last visit's debridement. Fortunately there is no signs of active infection at this time. No fevers, chills, nausea, vomiting, or diarrhea. Overall I am very pleased with where things stand today. 12/01/2020 on evaluation today patient appears to be doing well with regard to his leg ulcers. He has been tolerating the dressing changes without complication. Fortunately there is no signs of active infection at this time. No fevers, chills, nausea, vomiting, or diarrhea. 12/08/2020 on evaluation today patient appears to be doing very well in regard to his bilateral lower extremity ulcers. They seem to be doing excellent and he is making great progress. There does not appear to be any evidence of active infection at this time. No fevers, chills, nausea, vomiting, or diarrhea. 12/15/2020 upon evaluation today patient actually appears to be doing excellent in regard to his wounds. Has been tolerating the dressing changes without complication. Fortunately his wounds are showing signs of great improvement were using Hydrofera Blue. 12/29/2020 on  evaluation today patient appears to be doing well with regard to his right leg which is  completely healed. His left leg is also measuring smaller than not healed this is doing very well. Fortunately there is no signs of active infection at this time 01/05/2021 upon evaluation today patient actually appears to be making signs of improvement with regard to his left leg. I am very pleased with how things are progressing. There is no evidence of active infection at this time. No fevers, chills, nausea, vomiting, or diarrhea. Patient is extremely happy with the fact that this is doing so well 01/12/2021 upon evaluation today patient appears to be doing excellent in regard to his leg ulcer. Fortunately there is no signs of active infection at this time. No fevers, chills, nausea, vomiting, or diarrhea. He has been tolerating the dressing changes without complication. 01/19/2021 upon evaluation today patient appears to be doing well with regard to his wound. In fact this appears to be completely healed on the left leg. This is brand-new skin but overall seems to be doing quite well which is great news. No fevers, chills, nausea, vomiting, or diarrhea. READMISSION 04/17/2022 This is a patient has been seen on a number of occasions in the wound care center. He is 71 years old and has type 2 diabetes mellitus, coronary artery disease, chronic venous insufficiency, and peripheral arterial disease with recent vascular studies demonstrating a right TBI of 0.9 (normal) and a left great toe TBI of 0.33. 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 He recently dropped an object and it hit him in the left shin. He then bumped the same leg against his wagon. He now has 2 wounds on his left anterior lower leg. He says that he applied Aquaphor to keep the areas moist and made an appointment in the wound care center for follow-up. 04/24/2022: Today, he is here with what he describes as 10 out of 10 pain in his wounds. Both of them measured a little bit larger today, but this may be secondary to the  debridement that was performed last week. He says that due to it being spring season, he is on his feet quite a bit more. The leg is red and warm. We have been using silver alginate with Kerlix and Coban wraps. 05/01/2022: PCR culture taken last week grew out MRSA. I prescribed doxycycline. He has been taking this. Both wounds are a bit smaller today and somewhat less painful. They continue to accumulate a bit of slough. 05/08/2022: He has completed his course of oral doxycycline. We have been using mupirocin under silver alginate with Kerlix and Coban wrapping. Today, both wounds are a bit smaller but have accumulated slough on the surface. They are less tender. 05/23/2022: Both wounds are quite a bit more shallow today. There is a little bit of slough accumulation, but there is also good granulation tissue as well as some buds of epithelialized tissue coming through the wound. No surrounding erythema, induration, nor any purulent drainage. We have been using mupirocin with silver alginate and Kerlix/Coban wrapping. 05/29/2022: The more anterior wound continues to demonstrate encroaching epithelium and the remaining open portion has good granulation tissue. The more lateral wound is nearly closed with just a pinpoint opening. No concern for infection. 06/05/2022: The lateral wound has healed. The more anterior wound is much smaller today with just a little bit of surrounding eschar. The wound surface is healthy-appearing with good granulation tissue. 06/13/2022: The wound on the anterior tibial  surface on the left is nearly closed. There is just a tiny opening underneath some eschar. Unfortunately, his dog jumped up on him and cut his right lower leg. There is a flap of nonviable skin with some hematoma and bruising. No surrounding erythema or induration. No purulent discharge. 06/19/2022: The wound on his left leg is closed. The right sided wound is smaller, but there is significant periwound erythema and  the leg is more tender. There is slough accumulation on the wound surface. 06/26/2022: The right sided wound is about the same. It is tender but there is less periwound erythema. He still has slough on the wound surface. He has been taking Bactrim as prescribed. 07/03/2022: The PCR culture that I took demonstrated bacteria not sensitive to Bactrim so he was prescribed levofloxacin, as supported by the culture data. He has been taking that and there has been significant improvement in his degree of pain. The wound itself is smaller with less slough accumulation. There is good granulation tissue emerging. 07/10/2022: The wound has contracted substantially and there is a lot of epithelialized tissue budding through. There is a small amount of slough on the surface. No concern for infection. 07/17/2022: The wound continues to contract and is down to just a fairly small open lesion. There is slough on the wound surface but underneath, the granulation tissue is healthy. No concern for infection. 07/24/2022: The wound is down to just a small slit. There is just a light layer of slough and some perimeter eschar present. 07/31/2022: The wound continues to contract. There is just some eschar overlying the surface. The underlying granulation tissue is robust. 08/07/2022: His wound has healed. READMISSION 09/04/2022 He returns today after suffering a laceration from his dog's toenail on his right medial lower leg. He says he has been trying to care for it at home but it has been draining so much fluid that he cannot keep up. No fevers or chills. No purulent discharge or odor. The laceration is about a centimeter and has some slough buildup in the base. 09/11/2022: He still had a bit of drainage from the wound but it was serosanguineous. It is smaller today by about half with just a little bit of slough in the base. 09/18/2022: His wound is down to just a couple of millimeters and is very clean without any slough  accumulation. Electronic Signature(s) Signed: 09/18/2022 8:18:25 AM By: Fredirick Maudlin MD FACS Entered By: Fredirick Maudlin on 09/18/2022 08:18:25 -------------------------------------------------------------------------------- Physical Exam Details Patient Name: Date of Service: Marcus [name], [name] 09/18/2022 8:00 A M Medical Record Number: [id number] Patient Account Number: [id number] Date of Birth/Sex: Treating RN: [birth year] (71 y.o. M) Primary Care Provider: [name] Other Clinician: Referring Provider: Treating Provider/Extender: [name] Weeks in Treatment: 22 Constitutional Slightly hypertensive. . . . No acute distress.Marland Kitchen Respiratory Normal work of breathing on room air.. Notes 09/18/2022: His wound is down to just a couple of millimeters and is very clean without any slough accumulation. Electronic Signature(s) Signed: 09/18/2022 8:18:53 AM By: Fredirick Maudlin MD FACS Entered By: Fredirick Maudlin on 09/18/2022 08:18:53 -------------------------------------------------------------------------------- Physician Orders Details Patient Name: Date of Service: Marcus [name], [name] 09/18/2022 8:00 A M Medical Record Number: [id number] Patient Account Number: [id number] Date of Birth/Sex: Treating RN: 1951-09-16 (71 y.o. Waldron Session Primary Care Provider: [name] Other Clinician: Referring Provider: Treating Provider/Extender: Casandra Doffing in Treatment: 33 Verbal / Phone Orders: No Diagnosis Coding ICD-10 Coding Code Description  E11.622 Type 2 diabetes mellitus with other skin ulcer I73.9 Peripheral vascular disease, unspecified I87.2 Venous insufficiency (chronic) (peripheral) L97.812 Non-pressure chronic ulcer of other part of right lower leg with fat layer exposed Follow-up Appointments ppointment in 1 week. - Dr. Celine Ahr Rm 4 Return A Monday Anesthetic (In clinic) Topical Lidocaine 5% applied to  wound bed - in clinic, prior to debridement Bathing/ Shower/ Hygiene May shower with protection but do not get wound dressing(s) wet. - Use cast protector to keep dressing dry. Can purchase at [facility] or [facility] Edema Control - Lymphedema / SCD / Other Bilateral Lower Extremities Elevate legs to the level of the heart or above for 30 minutes daily and/or when sitting, a frequency of: Avoid standing for long periods of time. Patient to wear own compression stockings every day. - On left leg Exercise regularly Moisturize legs daily. Wound Treatment Wound #6 - Lower Leg Wound Laterality: Right, Medial Cleanser: Soap and Water Discharge Instructions: May shower and wash wound with dial antibacterial soap and water prior to dressing change. Peri-Wound Care: Zinc Oxide Ointment 30g tube Discharge Instructions: Apply Zinc Oxide to periwound with each dressing change Prim Dressing: KerraCel Ag Gelling Fiber Dressing, 2x2 in (silver alginate) ary Discharge Instructions: Apply silver alginate to wound bed as instructed Secondary Dressing: Zetuvit Plus 4x8 in Discharge Instructions: Apply over primary dressing as directed. Secured With: 10M Medipore H Soft Cloth Surgical T ape, 4 x 10 (in/yd) Discharge Instructions: Secure with tape as directed. Compression Wrap: ThreePress (3 layer compression wrap) Discharge Instructions: Apply three layer compression as directed. Electronic Signature(s) Signed: 09/18/2022 8:21:51 AM By: Fredirick Maudlin MD FACS Entered By: Fredirick Maudlin on 09/18/2022 08:19:05 -------------------------------------------------------------------------------- Problem List Details Patient Name: Date of Service: Marcus [name], [name] 09/18/2022 8:00 A M Medical Record Number: [id number] Patient Account Number: [id number] Date of Birth/Sex: Treating RN: 02/23/1951 (71 y.o. M) Primary Care Provider: [name] Other Clinician: Referring Provider: Treating Provider/Extender:  Casandra Doffing in Treatment: 22 Active Problems ICD-10 Encounter Code Description Active Date MDM Diagnosis E11.622 Type 2 diabetes mellitus with other skin ulcer 04/17/2022 No Yes I73.9 Peripheral vascular disease, unspecified 04/17/2022 No Yes I87.2 Venous insufficiency (chronic) (peripheral) 04/17/2022 No Yes L97.812 Non-pressure chronic ulcer of other part of right lower leg with fat layer 09/04/2022 No Yes exposed Inactive Problems Resolved Problems ICD-10 Code Description Active Date Resolved Date L97.822 Non-pressure chronic ulcer of other part of left lower leg with fat layer exposed 04/17/2022 04/17/2022 Electronic Signature(s) Signed: 09/18/2022 8:17:42 AM By: Fredirick Maudlin MD FACS Entered By: Fredirick Maudlin on 09/18/2022 08:17:41 -------------------------------------------------------------------------------- Progress Note Details Patient Name: Date of Service: Marcus [name], [name] 09/18/2022 8:00 A M Medical Record Number: [id number] Patient Account Number: [id number] Date of Birth/Sex: Treating RN: Jan 21, 1951 (71 y.o. M) Primary Care Provider: [name] Other Clinician: Referring Provider: Treating Provider/Extender: Casandra Doffing in Treatment: 22 Subjective Chief Complaint Information obtained from Patient Patient seen for complaints of Non-Healing Wounds. History of Present Illness (HPI) 11/10/2020 patient presents today for initial evaluation in this clinic although I have seen this patient in [place] previous. Subsequently his issue today is different from when I saw him for I was treating him for a toe ulcer. Currently he is having issues with bilateral lower extremity ulcers after running into a metal bar. Upon inspection today he has wounds of the bilateral lower extremities which are consistent with having struck his legs on the anterior aspect. With that  being said the patient did go to the ER for  evaluation on October 11 where he was given Keflex initially. He went back to the ER after not improving on October 20 he was given Lasix at that point he states the Lasix seem to help the most. Has been using Neosporin and leaving this open area which is probably not the best way to go either to be honest. He has had arterial studies on March 2021 which showed a left ABI of 0.78 with a TBI of 0.34 and a right ABI of 1.18 with a TBI 1.18. Nonetheless his arterial flow the left is not ideal but also I think potentially consistent with allowing this to heal. I think he can also support light compression with Kerlix and Coban based on this result. His most recent hemoglobin A1c that we could find was 7.3 and that was towards the end of 2020. The patient does have a history of heart disease unfortunately. He is also currently still a smoker. 11/24/2020 on evaluation today patient appears to be doing well with regard to his wounds. In fact both appear to be doing better after last week's rather last visit's debridement. Fortunately there is no signs of active infection at this time. No fevers, chills, nausea, vomiting, or diarrhea. Overall I am very pleased with where things stand today. 12/01/2020 on evaluation today patient appears to be doing well with regard to his leg ulcers. He has been tolerating the dressing changes without complication. Fortunately there is no signs of active infection at this time. No fevers, chills, nausea, vomiting, or diarrhea. 12/08/2020 on evaluation today patient appears to be doing very well in regard to his bilateral lower extremity ulcers. They seem to be doing excellent and he is making great progress. There does not appear to be any evidence of active infection at this time. No fevers, chills, nausea, vomiting, or diarrhea. 12/15/2020 upon evaluation today patient actually appears to be doing excellent in regard to his wounds. Has been tolerating the dressing changes  without complication. Fortunately his wounds are showing signs of great improvement were using Hydrofera Blue. 12/29/2020 on evaluation today patient appears to be doing well with regard to his right leg which is completely healed. His left leg is also measuring smaller than not healed this is doing very well. Fortunately there is no signs of active infection at this time 01/05/2021 upon evaluation today patient actually appears to be making signs of improvement with regard to his left leg. I am very pleased with how things are progressing. There is no evidence of active infection at this time. No fevers, chills, nausea, vomiting, or diarrhea. Patient is extremely happy with the fact that this is doing so well 01/12/2021 upon evaluation today patient appears to be doing excellent in regard to his leg ulcer. Fortunately there is no signs of active infection at this time. No fevers, chills, nausea, vomiting, or diarrhea. He has been tolerating the dressing changes without complication. 01/19/2021 upon evaluation today patient appears to be doing well with regard to his wound. In fact this appears to be completely healed on the left leg. This is brand-new skin but overall seems to be doing quite well which is great news. No fevers, chills, nausea, vomiting, or diarrhea. READMISSION 04/17/2022 This is a patient has been seen on a number of occasions in the wound care center. He is 71 years old and has type 2 diabetes mellitus, coronary artery disease, chronic venous insufficiency, and peripheral arterial  disease with recent vascular studies demonstrating a right TBI of 0.9 (normal) and a left great toe TBI of 0.33. 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 He recently dropped an object and it hit him in the left shin. He then bumped the same leg against his wagon. He now has 2 wounds on his left anterior lower leg. He says that he applied Aquaphor to keep the areas moist and made an  appointment in the wound care center for follow-up. 04/24/2022: Today, he is here with what he describes as 10 out of 10 pain in his wounds. Both of them measured a little bit larger today, but this may be secondary to the debridement that was performed last week. He says that due to it being spring season, he is on his feet quite a bit more. The leg is red and warm. We have been using silver alginate with Kerlix and Coban wraps. 05/01/2022: PCR culture taken last week grew out MRSA. I prescribed doxycycline. He has been taking this. Both wounds are a bit smaller today and somewhat less painful. They continue to accumulate a bit of slough. 05/08/2022: He has completed his course of oral doxycycline. We have been using mupirocin under silver alginate with Kerlix and Coban wrapping. Today, both wounds are a bit smaller but have accumulated slough on the surface. They are less tender. 05/23/2022: Both wounds are quite a bit more shallow today. There is a little bit of slough accumulation, but there is also good granulation tissue as well as some buds of epithelialized tissue coming through the wound. No surrounding erythema, induration, nor any purulent drainage. We have been using mupirocin with silver alginate and Kerlix/Coban wrapping. 05/29/2022: The more anterior wound continues to demonstrate encroaching epithelium and the remaining open portion has good granulation tissue. The more lateral wound is nearly closed with just a pinpoint opening. No concern for infection. 06/05/2022: The lateral wound has healed. The more anterior wound is much smaller today with just a little bit of surrounding eschar. The wound surface is healthy-appearing with good granulation tissue. 06/13/2022: The wound on the anterior tibial surface on the left is nearly closed. There is just a tiny opening underneath some eschar. Unfortunately, his dog jumped up on him and cut his right lower leg. There is a flap of nonviable skin with  some hematoma and bruising. No surrounding erythema or induration. No purulent discharge. 06/19/2022: The wound on his left leg is closed. The right sided wound is smaller, but there is significant periwound erythema and the leg is more tender. There is slough accumulation on the wound surface. 06/26/2022: The right sided wound is about the same. It is tender but there is less periwound erythema. He still has slough on the wound surface. He has been taking Bactrim as prescribed. 07/03/2022: The PCR culture that I took demonstrated bacteria not sensitive to Bactrim so he was prescribed levofloxacin, as supported by the culture data. He has been taking that and there has been significant improvement in his degree of pain. The wound itself is smaller with less slough accumulation. There is good granulation tissue emerging. 07/10/2022: The wound has contracted substantially and there is a lot of epithelialized tissue budding through. There is a small amount of slough on the surface. No concern for infection. 07/17/2022: The wound continues to contract and is down to just a fairly small open lesion. There is slough on the wound surface but underneath, the granulation tissue is healthy. No concern  for infection. 07/24/2022: The wound is down to just a small slit. There is just a light layer of slough and some perimeter eschar present. 07/31/2022: The wound continues to contract. There is just some eschar overlying the surface. The underlying granulation tissue is robust. 08/07/2022: His wound has healed. READMISSION 09/04/2022 He returns today after suffering a laceration from his dog's toenail on his right medial lower leg. He says he has been trying to care for it at home but it has been draining so much fluid that he cannot keep up. No fevers or chills. No purulent discharge or odor. The laceration is about a centimeter and has some slough buildup in the base. 09/11/2022: He still had a bit of drainage from  the wound but it was serosanguineous. It is smaller today by about half with just a little bit of slough in the base. 09/18/2022: His wound is down to just a couple of millimeters and is very clean without any slough accumulation. Patient History Information obtained from Patient. Social History Current every day smoker - 1 PPD, Marital Status - Married, Alcohol Use - Rarely, Drug Use - No History, Caffeine Use - Daily - coffee, soda. Medical History Eyes Patient has history of Cataracts - both eyes Cardiovascular Patient has history of Arrhythmia - A-fib, Coronary Artery Disease, Hypertension, Myocardial Infarction - CABG x 4, Peripheral Arterial Disease, Peripheral Venous Disease Endocrine Patient has history of Type II Diabetes Musculoskeletal Patient has history of Osteoarthritis Neurologic Patient has history of Neuropathy Oncologic Patient has history of Received Radiation Medical A Surgical History Notes nd Cardiovascular Hyperlipidemia, CVA, AAA Oncologic Prostate cancer Objective Constitutional Slightly hypertensive. No acute distress.. Vitals Time Taken: 7:57 AM, Height: 70 in, Weight: 219 lbs, BMI: 31.4, Temperature: 97.8 F, Pulse: 78 bpm, Respiratory Rate: 18 breaths/min, Blood Pressure: 147/80 mmHg. Respiratory Normal work of breathing on room air.. General Notes: 09/18/2022: His wound is down to just a couple of millimeters and is very clean without any slough accumulation. Integumentary (Hair, Skin) Wound #6 status is Open. Original cause of wound was Trauma. The date acquired was: 08/25/2022. The wound has been in treatment 2 weeks. The wound is located on the Right,Medial Lower Leg. The wound measures 0.3cm length x 0.4cm width x 0.1cm depth; 0.094cm^2 area and 0.009cm^3 volume. There is Fat Layer (Subcutaneous Tissue) exposed. There is no tunneling or undermining noted. There is a medium amount of serosanguineous drainage noted. There is medium (34-66%) pink  granulation within the wound bed. There is a medium (34-66%) amount of necrotic tissue within the wound bed including Adherent Slough. Assessment Active Problems ICD-10 Type 2 diabetes mellitus with other skin ulcer Peripheral vascular disease, unspecified Venous insufficiency (chronic) (peripheral) Non-pressure chronic ulcer of other part of right lower leg with fat layer exposed Procedures Wound #6 Pre-procedure diagnosis of Wound #6 is a Trauma, Other located on the Right,Medial Lower Leg . There was a Three Layer Compression Therapy Procedure by Blanche East, RN. Post procedure Diagnosis Wound #6: Same as Pre-Procedure Plan Follow-up Appointments: Return Appointment in 1 week. - Dr. Celine Ahr Rm 4 Monday Anesthetic: (In clinic) Topical Lidocaine 5% applied to wound bed - in clinic, prior to debridement Bathing/ Shower/ Hygiene: May shower with protection but do not get wound dressing(s) wet. - Use cast protector to keep dressing dry. Can purchase at [facility] or [facility] Edema Control - Lymphedema / SCD / Other: Elevate legs to the level of the heart or above for 30 minutes daily and/or when sitting,  a frequency of: Avoid standing for long periods of time. Patient to wear own compression stockings every day. - On left leg Exercise regularly Moisturize legs daily. WOUND #6: - Lower Leg Wound Laterality: Right, Medial Cleanser: Soap and Water Discharge Instructions: May shower and wash wound with dial antibacterial soap and water prior to dressing change. Peri-Wound Care: Zinc Oxide Ointment 30g tube Discharge Instructions: Apply Zinc Oxide to periwound with each dressing change Prim Dressing: KerraCel Ag Gelling Fiber Dressing, 2x2 in (silver alginate) ary Discharge Instructions: Apply silver alginate to wound bed as instructed Secondary Dressing: Zetuvit Plus 4x8 in Discharge Instructions: Apply over primary dressing as directed. Secured With: 47M Medipore H Soft Cloth Surgical T ape,  4 x 10 (in/yd) Discharge Instructions: Secure with tape as directed. Com pression Wrap: ThreePress (3 layer compression wrap) Discharge Instructions: Apply three layer compression as directed. 09/18/2022: His wound is down to just a couple of millimeters and is very clean without any slough accumulation. No debridement was necessary today. We will continue to use silver alginate and 3 layer compression. Follow-up in 1 week, at which time I anticipate he will be healed. Electronic Signature(s) Signed: 09/18/2022 8:19:35 AM By: Fredirick Maudlin MD FACS Entered By: Fredirick Maudlin on 09/18/2022 08:19:34 -------------------------------------------------------------------------------- HxROS Details Patient Name: Date of Service: Marcus [name], [name] 09/18/2022 8:00 A M Medical Record Number: [id number] Patient Account Number: [id number] Date of Birth/Sex: Treating RN: 18-Apr-1951 (71 y.o. M) Primary Care Provider: [name] Other Clinician: Referring Provider: Treating Provider/Extender: Casandra Doffing in Treatment: 41 Information Obtained From Patient Eyes Medical History: Positive for: Cataracts - both eyes Cardiovascular Medical History: Positive for: Arrhythmia - A-fib; Coronary Artery Disease; Hypertension; Myocardial Infarction - CABG x 4; Peripheral Arterial Disease; Peripheral Venous Disease Past Medical History Notes: Hyperlipidemia, CVA, AAA Endocrine Medical History: Positive for: Type II Diabetes Time with diabetes: over 5 years Treated with: Oral agents Blood sugar tested every day: No Musculoskeletal Medical History: Positive for: Osteoarthritis Neurologic Medical History: Positive for: Neuropathy Oncologic Medical History: Positive for: Received Radiation Past Medical History Notes: Prostate cancer HBO Extended History Items Eyes: Cataracts Immunizations Pneumococcal Vaccine: Received Pneumococcal Vaccination: Yes Received  Pneumococcal Vaccination On or After 60th Birthday: Yes Implantable Devices None Family and Social History Current every day smoker - 1 PPD; Marital Status - Married; Alcohol Use: Rarely; Drug Use: No History; Caffeine Use: Daily - coffee, soda; Financial Concerns: No; Food, Clothing or Shelter Needs: No; Support System Lacking: No; Transportation Concerns: No Electronic Signature(s) Signed: 09/18/2022 8:21:51 AM By: Fredirick Maudlin MD FACS Entered By: Fredirick Maudlin on 09/18/2022 08:18:31 -------------------------------------------------------------------------------- SuperBill Details Patient Name: Date of Service: Marcus [name], [name] 09/18/2022 Medical Record Number: [id number] Patient Account Number: [id number] Date of Birth/Sex: Treating RN: Sep 22, 1951 (71 y.o. M) Primary Care Provider: [name] Other Clinician: Referring Provider: Treating Provider/Extender: Casandra Doffing in Treatment: 22 Diagnosis Coding ICD-10 Codes Code Description E11.622 Type 2 diabetes mellitus with other skin ulcer I73.9 Peripheral vascular disease, unspecified I87.2 Venous insufficiency (chronic) (peripheral) L97.812 Non-pressure chronic ulcer of other part of right lower leg with fat layer exposed Physician Procedures : CPT4 Code Description Modifier [id number] - WC PHYS LEVEL 3 - EST PT ICD-10 Diagnosis Description L97.812 Non-pressure chronic ulcer of other part of right lower leg with fat layer exposed I87.2 Venous insufficiency (chronic) (peripheral) E11.622  Type 2 diabetes mellitus with other skin ulcer I73.9 Peripheral vascular disease, unspecified Quantity: 1 Electronic Signature(s)  Signed: 09/18/2022 8:19:48 AM By: Fredirick Maudlin MD FACS Entered By: Fredirick Maudlin on 09/18/2022 08:19:48

## 2022-09-19 DIAGNOSIS — M1711 Unilateral primary osteoarthritis, right knee: Secondary | ICD-10-CM | POA: Diagnosis not present

## 2022-09-20 ENCOUNTER — Telehealth: Payer: Self-pay

## 2022-09-20 ENCOUNTER — Telehealth: Payer: Self-pay | Admitting: Cardiovascular Disease

## 2022-09-20 NOTE — Telephone Encounter (Signed)
Spoke with patient who stated he did not have his evening dose of eliquis yesterday and wants more samples. Samples approved by C. Pavero, PharmD. Recommended to patient to complete PAP form and to contact clinic at least a week before he runs out of medication. Samples and PAP form left in front office for patient to pick up.

## 2022-09-20 NOTE — Telephone Encounter (Signed)
Patient calling the office for samples of medication:   1.  What medication and dosage are you requesting samples for? apixaban (ELIQUIS) 5 MG TABS tablet  2.  Are you currently out of this medication? Yes    

## 2022-09-20 NOTE — Telephone Encounter (Signed)
   Pre-operative Risk Assessment    Patient Name: [name]  DOB: 18-Sep-1951 MRN: [id number]{   Request for Surgical Clearance    Procedure:   TKA  Date of Surgery:  Clearance TBD                             {    Surgeon:  Vickey Huger Surgeon's Group or Practice Name:  [facility] Phone number:  [phone] Fax number:  [phone]   Type of Clearance Requested:   - Medical    Type of Anesthesia:  Spinal   Additional requests/questions:   Order specialty labs prn valid for 3 months EKG (CAD, MI, stent, CABG, dysrhythmia), CXR prn  Signed, Kathreen Devoid   09/20/2022, 9:57 AM

## 2022-09-21 NOTE — Telephone Encounter (Signed)
Patient with diagnosis of PAF on Eliquis for anticoagulation.    Procedure: TKA Date of procedure: TBD   CHA2DS2-VASc Score = 6  This indicates a 9.7% annual risk of stroke. The patient's score is based upon: CHF History: 0 HTN History: 1 Diabetes History: 1 Stroke History: 2 Vascular Disease History: 1 Age Score: 1 Gender Score: 0   CrCl 135 mL/min Platelet count 221K  Patient will need to hold Eliquis for 3 days for TKA however has multiple comorbidities including stroke. Will need clearance from Dr Sallyanne Kuster.

## 2022-09-22 NOTE — Telephone Encounter (Signed)
   Patient Name: [name]  DOB: [birth year] MRN: [id number]  Primary Cardiologist: Sanda Klein, MD  Chart reviewed as part of pre-operative protocol coverage. Per Dr. Sallyanne Kuster, primary cardiologist, and given past medical history and time since last visit, based on ACC/AHA guidelines, [name] would be at acceptable risk for the planned procedure without further cardiovascular testing.   Patient with diagnosis of PAF on Eliquis for anticoagulation.     Procedure: TKA Date of procedure: TBD     CHA2DS2-VASc Score = 6  This indicates a 9.7% annual risk of stroke. The patient's score is based upon: CHF History: 0 HTN History: 1 Diabetes History: 1 Stroke History: 2 Vascular Disease History: 1 Age Score: 1 Gender Score: 0     CrCl 135 mL/min Platelet count 221K   Patient will need to hold Eliquis for 3 days for TKA however has multiple comorbidities including stroke. Per Dr. Sallyanne Kuster, he "sent a note of "clearance" to the surgeon via epic."   I will route this recommendation to the requesting party via Epic fax function and remove from pre-op pool.  Please call with questions.  Lenna Sciara, NP 09/22/2022, 8:22 AM

## 2022-09-22 NOTE — Progress Notes (Signed)
JESS, TONEY ([id number]) . Visit Report for 09/18/2022 Arrival Information Details Patient Name: Date of Service: [name] 09/18/2022 8:00 A M Medical Record Number: [id number] Patient Account Number: [id number] Date of Birth/Sex: Treating RN: May 27, 1951 (71 y.o. Waldron Session Primary Care Elimelech Houseman: [name] Other Clinician: Referring Molly Savarino: Treating Aviance Cooperwood/Extender: Casandra Doffing in Treatment: 61 Visit Information History Since Last Visit All ordered tests and consults were completed: Yes Patient Arrived: Ambulatory Added or deleted any medications: No Arrival Time: 07:57 Any new allergies or adverse reactions: No Accompanied By: self Had a fall or experienced change in No Transfer Assistance: None activities of daily living that may affect Patient Requires Transmission-Based Precautions: No risk of falls: Patient Has Alerts: Yes Signs or symptoms of abuse/neglect since last visito No Patient Alerts: Patient on Blood Thinner Hospitalized since last visit: No ABI R 1.11 Implantable device outside of the clinic excluding No ABI L 0.72 cellular tissue based products placed in the center TBI L 0.33 since last visit: Has Dressing in Place as Prescribed: Yes Has Compression in Place as Prescribed: Yes Pain Present Now: No Electronic Signature(s) Signed: 09/22/2022 4:38:59 PM By: Blanche East RN Entered By: Blanche East on 09/18/2022 07:57:43 -------------------------------------------------------------------------------- Compression Therapy Details Patient Name: Date of Service: [name], [name] 09/18/2022 8:00 [place] Record Number: [id number] Patient Account Number: [id number] Date of Birth/Sex: Treating RN: [birth year] (71 y.o. Waldron Session Primary Care Cyndie Woodbeck: [name] Other Clinician: Referring Akaylah Lalley: Treating Yaviel Kloster/Extender: Casandra Doffing in Treatment:  22 Compression Therapy Performed for Wound Assessment: Wound #6 Right,Medial Lower Leg Performed By: Clinician Blanche East, RN Compression Type: Three Layer Post Procedure Diagnosis Same as Pre-procedure Electronic Signature(s) Signed: 09/22/2022 4:38:59 PM By: Blanche East RN Entered By: Blanche East on 09/18/2022 08:11:21 -------------------------------------------------------------------------------- Encounter Discharge Information Details Patient Name: Date of Service: [name], [name] 09/18/2022 8:00 A M Medical Record Number: [id number] Patient Account Number: [id number] Date of Birth/Sex: Treating RN: 07-22-1951 (71 y.o. Waldron Session Primary Care Andree Heeg: [name] Other Clinician: Referring Arlene Brickel: Treating Roza Creamer/Extender: Casandra Doffing in Treatment: 22 Encounter Discharge Information Items Discharge Condition: Stable Ambulatory Status: Ambulatory Discharge Destination: Home Transportation: Private Auto Accompanied By: self Schedule Follow-up Appointment: Yes Clinical Summary of Care: Electronic Signature(s) Signed: 09/22/2022 4:38:59 PM By: Blanche East RN Entered By: Blanche East on 09/18/2022 08:21:16 -------------------------------------------------------------------------------- Lower Extremity Assessment Details Patient Name: Date of Service: MO Ileana Ladd M T. 09/18/2022 8:00 A M Medical Record Number: [id number] Patient Account Number: [id number] Date of Birth/Sex: Treating RN: December 11, 1951 (71 y.o. Waldron Session Primary Care Gissell Barra: [name] Other Clinician: Referring Bastien Strawser: Treating Tammie Yanda/Extender: [name] Weeks in Treatment: 22 Edema Assessment Assessed: [Left: No] [Right: No] Edema: [Left: Ye] [Right: s] Calf Left: Right: Point of Measurement: 34 cm From Medial Instep 37.5 cm Ankle Left: Right: Point of Measurement: 12 cm From Medial Instep 24.5  cm Vascular Assessment Pulses: Dorsalis Pedis Palpable: [Right:Yes] Electronic Signature(s) Signed: 09/22/2022 4:38:59 PM By: Blanche East RN Entered By: Blanche East on 09/18/2022 08:00:31 -------------------------------------------------------------------------------- Multi Wound Chart Details Patient Name: Date of Service: [name], [name] 09/18/2022 8:00 A M Medical Record Number: [id number] Patient Account Number: [id number] Date of Birth/Sex: Treating RN: 19-Mar-1951 (71 y.o. M) Primary Care Maximiano Lott: [name] Other Clinician: Referring Amritha Yorke: Treating Anyi Fels/Extender: [name] Weeks in Treatment: 22 Vital Signs Height(in): 70 Pulse(bpm):  78 Weight(lbs): 219 Blood Pressure(mmHg): 147/80 Body Mass Index(BMI): 31.4 Temperature(F): 97.8 Respiratory Rate(breaths/min): 18 Photos: [N/A:N/A] Right, Medial Lower Leg N/A N/A Wound Location: Trauma N/A N/A Wounding Event: Trauma, Other N/A N/A Primary Etiology: Cataracts, Arrhythmia, Coronary N/A N/A Comorbid History: Artery Disease, Hypertension, Myocardial Infarction, Peripheral Arterial Disease, Peripheral Venous Disease, Type II Diabetes, Osteoarthritis, Neuropathy, Received Radiation 08/25/2022 N/A N/A Date Acquired: 2 N/A N/A Weeks of Treatment: Open N/A N/A Wound Status: No N/A N/A Wound Recurrence: 0.3x0.4x0.1 N/A N/A Measurements L x W x D (cm) 0.094 N/A N/A A (cm) : rea 0.009 N/A N/A Volume (cm) : 70.10% N/A N/A % Reduction in Area: 71.00% N/A N/A % Reduction in Volume: Full Thickness Without Exposed N/A N/A Classification: Support Structures Medium N/A N/A Exudate Amount: Serosanguineous N/A N/A Exudate Type: red, brown N/A N/A Exudate Color: Medium (34-66%) N/A N/A Granulation Amount: Pink N/A N/A Granulation Quality: Medium (34-66%) N/A N/A Necrotic Amount: Fat Layer (Subcutaneous Tissue): Yes N/A N/A Exposed Structures: Fascia:  No Tendon: No Muscle: No Joint: No Bone: No Medium (34-66%) N/A N/A Epithelialization: Compression Therapy N/A N/A Procedures Performed: Treatment Notes Electronic Signature(s) Signed: 09/18/2022 8:17:48 AM By: Fredirick Maudlin MD FACS Entered By: Fredirick Maudlin on 09/18/2022 08:17:48 -------------------------------------------------------------------------------- Multi-Disciplinary Care Plan Details Patient Name: Date of Service: [name], [name] 09/18/2022 8:00 A M Medical Record Number: [id number] Patient Account Number: [id number] Date of Birth/Sex: Treating RN: 12-Feb-1951 (71 y.o. Waldron Session Primary Care Marthena Whitmyer: [name] Other Clinician: Referring Emaline Karnes: Treating Fanchon Papania/Extender: [name] Weeks in Treatment: 22 Active Inactive Wound/Skin Impairment Nursing Diagnoses: Impaired tissue integrity Goals: Ulcer/skin breakdown will have a volume reduction of 30% by week 4 Date Initiated: 09/04/2022 Target Resolution Date: 10/02/2022 Goal Status: Active Interventions: Assess ulceration(s) every visit Treatment Activities: Skin care regimen initiated : 04/17/2022 Notes: Electronic Signature(s) Signed: 09/22/2022 4:38:59 PM By: Blanche East RN Entered By: Blanche East on 09/18/2022 08:07:49 -------------------------------------------------------------------------------- Pain Assessment Details Patient Name: Date of Service: [name], [name] 09/18/2022 8:00 A M Medical Record Number: [id number] Patient Account Number: [id number] Date of Birth/Sex: Treating RN: [birth year] (71 y.o. Waldron Session Primary Care Laquandra Carrillo: [name] Other Clinician: Referring Hailyn Zarr: Treating Lashanta Elbe/Extender: [name] Weeks in Treatment: 22 Active Problems Location of Pain Severity and Description of Pain Patient Has Paino No Site Locations Pain Management and Medication Current Pain  Management: Electronic Signature(s) Signed: 09/22/2022 4:38:59 PM By: Blanche East RN Entered By: Blanche East on 09/18/2022 07:58:11 -------------------------------------------------------------------------------- Patient/Caregiver Education Details Patient Name: Date of Service: MO O DY, [name] 9/25/2023andnbsp8:00 A M Medical Record Number: [id number] Patient Account Number: [id number] Date of Birth/Gender: Treating RN: 05-08-1951 (71 y.o. Waldron Session Primary Care Physician: [name] Other Clinician: Referring Physician: Treating Physician/Extender: Casandra Doffing in Treatment: 22 Education Assessment Education Provided To: Patient Education Topics Provided Wound/Skin Impairment: Methods: Explain/Verbal Responses: Reinforcements needed, State content correctly [employer]) Signed: 09/22/2022 4:38:59 PM By: Blanche East RN Entered By: Blanche East on 09/18/2022 08:08:04 -------------------------------------------------------------------------------- Wound Assessment Details Patient Name: Date of Service: [name], [name] 09/18/2022 8:00 A M Medical Record Number: [id number] Patient Account Number: [id number] Date of Birth/Sex: Treating RN: November 03, 1951 (71 y.o. Waldron Session Primary Care Reigan Tolliver: [name] Other Clinician: Referring Jeson Camacho: Treating Maxie Debose/Extender: [name] Weeks in Treatment: 22 Wound Status Wound Number: 6 Primary Trauma, Other Etiology: Wound Location: Right, Medial Lower Leg Wound Open Wounding Event: Trauma  Status: Date Acquired: 08/25/2022 Comorbid Cataracts, Arrhythmia, Coronary Artery Disease, Hypertension, Weeks Of Treatment: 2 History: Myocardial Infarction, Peripheral Arterial Disease, Peripheral Venous Clustered Wound: No Disease, Type II Diabetes, Osteoarthritis, Neuropathy, Received Radiation Photos Wound Measurements Length: (cm)  0.3 Width: (cm) 0.4 Depth: (cm) 0.1 Area: (cm) 0.094 Volume: (cm) 0.009 % Reduction in Area: 70.1% % Reduction in Volume: 71% Epithelialization: Medium (34-66%) Tunneling: No Undermining: No Wound Description Classification: Full Thickness Without Exposed Support Structures Exudate Amount: Medium Exudate Type: Serosanguineous Exudate Color: red, brown Foul Odor After Cleansing: No Slough/Fibrino Yes Wound Bed Granulation Amount: Medium (34-66%) Exposed Structure Granulation Quality: Pink Fascia Exposed: No Necrotic Amount: Medium (34-66%) Fat Layer (Subcutaneous Tissue) Exposed: Yes Necrotic Quality: Adherent Slough Tendon Exposed: No Muscle Exposed: No Joint Exposed: No Bone Exposed: No Treatment Notes Wound #6 (Lower Leg) Wound Laterality: Right, Medial Cleanser Soap and Water Discharge Instruction: May shower and wash wound with dial antibacterial soap and water prior to dressing change. Peri-Wound Care Zinc Oxide Ointment 30g tube Discharge Instruction: Apply Zinc Oxide to periwound with each dressing change Topical Primary Dressing KerraCel Ag Gelling Fiber Dressing, 2x2 in (silver alginate) Discharge Instruction: Apply silver alginate to wound bed as instructed Secondary Dressing Zetuvit Plus 4x8 in Discharge Instruction: Apply over primary dressing as directed. Secured With 22M Medipore H Soft Cloth Surgical T ape, 4 x 10 (in/yd) Discharge Instruction: Secure with tape as directed. Compression Wrap ThreePress (3 layer compression wrap) Discharge Instruction: Apply three layer compression as directed. Compression Stockings Add-Ons Electronic Signature(s) Signed: 09/22/2022 4:38:59 PM By: Blanche East RN Entered By: Blanche East on 09/18/2022 08:07:18 -------------------------------------------------------------------------------- Vitals Details Patient Name: Date of Service: [name], [name] 09/18/2022 8:00 A M Medical Record Number:  [id number] Patient Account Number: [id number] Date of Birth/Sex: Treating RN: [birth year] (71 y.o. Waldron Session Primary Care Shylee Durrett: [name] Other Clinician: Referring Britton Bera: Treating Mikey Maffett/Extender: Casandra Doffing in Treatment: 22 Vital Signs Time Taken: 07:57 Temperature (F): 97.8 Height (in): 70 Pulse (bpm): 78 Weight (lbs): 219 Respiratory Rate (breaths/min): 18 Body Mass Index (BMI): 31.4 Blood Pressure (mmHg): 147/80 Reference Range: 80 - 120 mg / dl Electronic Signature(s) Signed: 09/22/2022 4:38:59 PM By: Blanche East RN Entered By: Blanche East on 09/18/2022 07:58:06

## 2022-09-25 ENCOUNTER — Encounter: Payer: Self-pay | Admitting: Physician Assistant

## 2022-09-25 ENCOUNTER — Ambulatory Visit (INDEPENDENT_AMBULATORY_CARE_PROVIDER_SITE_OTHER): Payer: PPO | Admitting: Physician Assistant

## 2022-09-25 ENCOUNTER — Encounter (HOSPITAL_BASED_OUTPATIENT_CLINIC_OR_DEPARTMENT_OTHER): Payer: PPO | Attending: General Surgery | Admitting: General Surgery

## 2022-09-25 VITALS — BP 125/79 | HR 65 | Temp 97.3°F | Ht 70.0 in | Wt 234.0 lb

## 2022-09-25 DIAGNOSIS — S81811A Laceration without foreign body, right lower leg, initial encounter: Secondary | ICD-10-CM | POA: Insufficient documentation

## 2022-09-25 DIAGNOSIS — E11622 Type 2 diabetes mellitus with other skin ulcer: Secondary | ICD-10-CM | POA: Diagnosis not present

## 2022-09-25 DIAGNOSIS — E1142 Type 2 diabetes mellitus with diabetic polyneuropathy: Secondary | ICD-10-CM

## 2022-09-25 DIAGNOSIS — I152 Hypertension secondary to endocrine disorders: Secondary | ICD-10-CM

## 2022-09-25 DIAGNOSIS — Z716 Tobacco abuse counseling: Secondary | ICD-10-CM | POA: Diagnosis not present

## 2022-09-25 DIAGNOSIS — I480 Paroxysmal atrial fibrillation: Secondary | ICD-10-CM

## 2022-09-25 DIAGNOSIS — E1169 Type 2 diabetes mellitus with other specified complication: Secondary | ICD-10-CM

## 2022-09-25 DIAGNOSIS — L97812 Non-pressure chronic ulcer of other part of right lower leg with fat layer exposed: Secondary | ICD-10-CM | POA: Diagnosis not present

## 2022-09-25 DIAGNOSIS — E1151 Type 2 diabetes mellitus with diabetic peripheral angiopathy without gangrene: Secondary | ICD-10-CM | POA: Insufficient documentation

## 2022-09-25 DIAGNOSIS — E669 Obesity, unspecified: Secondary | ICD-10-CM

## 2022-09-25 DIAGNOSIS — I890 Lymphedema, not elsewhere classified: Secondary | ICD-10-CM | POA: Diagnosis not present

## 2022-09-25 DIAGNOSIS — E782 Mixed hyperlipidemia: Secondary | ICD-10-CM

## 2022-09-25 DIAGNOSIS — Z01818 Encounter for other preprocedural examination: Secondary | ICD-10-CM

## 2022-09-25 DIAGNOSIS — Z8546 Personal history of malignant neoplasm of prostate: Secondary | ICD-10-CM | POA: Diagnosis not present

## 2022-09-25 DIAGNOSIS — W2209XA Striking against other stationary object, initial encounter: Secondary | ICD-10-CM | POA: Insufficient documentation

## 2022-09-25 DIAGNOSIS — E1159 Type 2 diabetes mellitus with other circulatory complications: Secondary | ICD-10-CM | POA: Diagnosis not present

## 2022-09-25 DIAGNOSIS — I872 Venous insufficiency (chronic) (peripheral): Secondary | ICD-10-CM | POA: Insufficient documentation

## 2022-09-25 DIAGNOSIS — S81801A Unspecified open wound, right lower leg, initial encounter: Secondary | ICD-10-CM | POA: Diagnosis not present

## 2022-09-25 LAB — POCT GLYCOSYLATED HEMOGLOBIN (HGB A1C): Hemoglobin A1C: 7.9 % — AB (ref 4.0–5.6)

## 2022-09-25 MED ORDER — METFORMIN HCL ER 500 MG PO TB24: 500.0000 mg | ORAL_TABLET | Freq: Every day | ORAL | 0 refills | Status: DC

## 2022-09-25 MED ORDER — GLIPIZIDE 5 MG PO TABS
5.0000 mg | ORAL_TABLET | Freq: Two times a day (BID) | ORAL | 0 refills | Status: DC
Start: 2022-09-25 — End: 2023-02-12

## 2022-09-25 NOTE — Progress Notes (Signed)
SPIRO, [name] ([id number]) . Visit Report for 09/25/2022 Arrival Information Details Patient Name: Date of Service: [name]. 09/25/2022 9:45 A M Medical Record Number: [id number] Patient Account Number: [id number] Date of Birth/Sex: Treating RN: 1951-11-08 (71 y.o. Waldron Session Primary Care Torsha Lemus: [name] Other Clinician: Referring Telesha Deguzman: Treating Charlii Yost/Extender: Casandra Doffing in Treatment: 23 Visit Information History Since Last Visit All ordered tests and consults were completed: Yes Patient Arrived: Ambulatory Added or deleted any medications: No Arrival Time: 09:41 Any new allergies or adverse reactions: No Accompanied By: Self Had a fall or experienced change in No Transfer Assistance: None activities of daily living that may affect Patient Identification Verified: Yes risk of falls: Secondary Verification Process Completed: Yes Signs or symptoms of abuse/neglect since last visito No Patient Requires Transmission-Based Precautions: No Hospitalized since last visit: No Patient Has Alerts: Yes Implantable device outside of the clinic excluding No Patient Alerts: Patient on Blood Thinner cellular tissue based products placed in the center ABI R 1.11 since last visit: ABI L 0.72 Has Dressing in Place as Prescribed: Yes TBI L 0.33 Has Compression in Place as Prescribed: Yes Pain Present Now: No Electronic Signature(s) Signed: 09/25/2022 4:21:20 PM By: Blanche East RN Entered By: Blanche East on 09/25/2022 09:43:12 -------------------------------------------------------------------------------- Clinic Level of Care Assessment Details Patient Name: Date of Service: [name] 09/25/2022 9:45 A M Medical Record Number: [id number] Patient Account Number: [id number] Date of Birth/Sex: Treating RN: 03-15-1951 (71 y.o. Waldron Session Primary Care Shaarav Ripple: [name] Other Clinician: Referring  Charlize Hathaway: Treating Annasophia Crocker/Extender: Casandra Doffing in Treatment: 23 Clinic Level of Care Assessment Items TOOL 4 Quantity Score X- 1 0 Use when only an EandM is performed on FOLLOW-UP visit ASSESSMENTS - Nursing Assessment / Reassessment X- 1 10 Reassessment of Co-morbidities (includes updates in patient status) X- 1 5 Reassessment of Adherence to Treatment Plan ASSESSMENTS - Wound and Skin A ssessment / Reassessment X - Simple Wound Assessment / Reassessment - one wound 1 5 '[]'$  - 0 Complex Wound Assessment / Reassessment - multiple wounds X- 1 10 Dermatologic / Skin Assessment (not related to wound area) ASSESSMENTS - Focused Assessment X- 1 5 Circumferential Edema Measurements - multi extremities '[]'$  - 0 Nutritional Assessment / Counseling / Intervention '[]'$  - 0 Lower Extremity Assessment (monofilament, tuning fork, pulses) '[]'$  - 0 Peripheral Arterial Disease Assessment (using hand held doppler) ASSESSMENTS - Ostomy and/or Continence Assessment and Care '[]'$  - 0 Incontinence Assessment and Management '[]'$  - 0 Ostomy Care Assessment and Management (repouching, etc.) PROCESS - Coordination of Care X - Simple Patient / Family Education for ongoing care 1 15 '[]'$  - 0 Complex (extensive) Patient / Family Education for ongoing care '[]'$  - 0 Staff obtains Programmer, systems, Records, T Results / Process Orders est '[]'$  - 0 Staff telephones HHA, Nursing Homes / Clarify orders / etc '[]'$  - 0 Routine Transfer to another Facility (non-emergent condition) '[]'$  - 0 Routine Hospital Admission (non-emergent condition) '[]'$  - 0 New Admissions / Biomedical engineer / Ordering NPWT Apligraf, etc. , '[]'$  - 0 Emergency Hospital Admission (emergent condition) X- 1 10 Simple Discharge Coordination '[]'$  - 0 Complex (extensive) Discharge Coordination PROCESS - Special Needs '[]'$  - 0 Pediatric / Minor Patient Management '[]'$  - 0 Isolation Patient Management '[]'$  - 0 Hearing / Language /  Visual special needs '[]'$  - 0 Assessment of Community assistance (transportation, D/C planning, etc.) '[]'$  - 0 Additional assistance / Altered  mentation '[]'$  - 0 Support Surface(s) Assessment (bed, cushion, seat, etc.) INTERVENTIONS - Wound Cleansing / Measurement X - Simple Wound Cleansing - one wound 1 5 '[]'$  - 0 Complex Wound Cleansing - multiple wounds '[]'$  - 0 Wound Imaging (photographs - any number of wounds) '[]'$  - 0 Wound Tracing (instead of photographs) X- 1 5 Simple Wound Measurement - one wound '[]'$  - 0 Complex Wound Measurement - multiple wounds INTERVENTIONS - Wound Dressings X - Small Wound Dressing one or multiple wounds 1 10 '[]'$  - 0 Medium Wound Dressing one or multiple wounds '[]'$  - 0 Large Wound Dressing one or multiple wounds '[]'$  - 0 Application of Medications - topical '[]'$  - 0 Application of Medications - injection INTERVENTIONS - Miscellaneous '[]'$  - 0 External ear exam '[]'$  - 0 Specimen Collection (cultures, biopsies, blood, body fluids, etc.) '[]'$  - 0 Specimen(s) / Culture(s) sent or taken to Lab for analysis '[]'$  - 0 Patient Transfer (multiple staff / Civil Service fast streamer / Similar devices) '[]'$  - 0 Simple Staple / Suture removal (25 or less) '[]'$  - 0 Complex Staple / Suture removal (26 or more) '[]'$  - 0 Hypo / Hyperglycemic Management (close monitor of Blood Glucose) '[]'$  - 0 Ankle / Brachial Index (ABI) - do not check if billed separately X- 1 5 Vital Signs Has the patient been seen at the hospital within the last three years: Yes Total Score: 85 Level Of Care: New/Established - Level 3 Electronic Signature(s) Signed: 09/25/2022 4:21:20 PM By: Blanche East RN Entered By: Blanche East on 09/25/2022 09:58:31 -------------------------------------------------------------------------------- Encounter Discharge Information Details Patient Name: Date of Service: [name], [name] 09/25/2022 9:45 A M Medical Record Number: [id number] Patient Account Number: [id number] Date of  Birth/Sex: Treating RN: 03-02-1951 (71 y.o. Waldron Session Primary Care Hunter Pinkard: [name] Other Clinician: Referring Kanna Dafoe: Treating Grady Mohabir/Extender: Casandra Doffing in Treatment: 23 Encounter Discharge Information Items Discharge Condition: Unstable Ambulatory Status: Ambulatory Discharge Destination: Home Transportation: Private Auto Accompanied By: self Schedule Follow-up Appointment: Yes Clinical Summary of Care: Electronic Signature(s) Signed: 09/25/2022 4:21:20 PM By: Blanche East RN Entered By: Blanche East on 09/25/2022 09:57:03 -------------------------------------------------------------------------------- Lower Extremity Assessment Details Patient Name: Date of Service: [name] 09/25/2022 9:45 A M Medical Record Number: [id number] Patient Account Number: [id number] Date of Birth/Sex: Treating RN: 1951/12/20 (71 y.o. Waldron Session Primary Care Joas Motton: [name] Other Clinician: Referring Amar Sippel: Treating Aadil Sur/Extender: [name] Weeks in Treatment: 23 Edema Assessment Assessed: [Left: No] [Right: No] Edema: [Left: Ye] [Right: s] Calf Left: Right: Point of Measurement: 34 cm From Medial Instep 38 cm Ankle Left: Right: Point of Measurement: 12 cm From Medial Instep 24 cm Vascular Assessment Pulses: Dorsalis Pedis Palpable: [Right:Yes] Electronic Signature(s) Signed: 09/25/2022 4:21:20 PM By: Blanche East RN Entered By: Blanche East on 09/25/2022 09:46:47 -------------------------------------------------------------------------------- Multi Wound Chart Details Patient Name: Date of Service: [name], [name] 09/25/2022 9:45 A M Medical Record Number: [id number] Patient Account Number: [id number] Date of Birth/Sex: Treating RN: 25-Jun-1951 (71 y.o. M) Primary Care Shalom Ware: [name] Other Clinician: Referring Jeanne Diefendorf: Treating Gustav Knueppel/Extender: Casandra Doffing in Treatment: 23 Vital Signs Height(in): 70 Pulse(bpm): 34 Weight(lbs): 219 Blood Pressure(mmHg): 123/80 Body Mass Index(BMI): 31.4 Temperature(F): 98.2 Respiratory Rate(breaths/min): 18 Photos: [N/A:N/A] Right, Medial Lower Leg N/A N/A Wound Location: Trauma N/A N/A Wounding Event: Trauma, Other N/A N/A Primary Etiology: Cataracts, Arrhythmia, Coronary N/A N/A Comorbid History: Artery Disease, Hypertension, Myocardial Infarction, Peripheral Arterial  Disease, Peripheral Venous Disease, Type II Diabetes, Osteoarthritis, Neuropathy, Received Radiation 08/25/2022 N/A N/A Date Acquired: 3 N/A N/A Weeks of Treatment: Healed - Epithelialized N/A N/A Wound Status: No N/A N/A Wound Recurrence: 0x0x0 N/A N/A Measurements L x W x D (cm) 0 N/A N/A A (cm) : rea 0 N/A N/A Volume (cm) : 97.50% N/A N/A % Reduction in Area: 96.80% N/A N/A % Reduction in Volume: Full Thickness Without Exposed N/A N/A Classification: Support Structures None Present N/A N/A Exudate Amount: Flat and Intact N/A N/A Wound Margin: Large (67-100%) N/A N/A Granulation Amount: Pink N/A N/A Granulation Quality: None Present (0%) N/A N/A Necrotic Amount: Fat Layer (Subcutaneous Tissue): Yes N/A N/A Exposed Structures: Fascia: No Tendon: No Muscle: No Joint: No Bone: No None N/A N/A Epithelialization: Treatment Notes Wound #6 (Lower Leg) Wound Laterality: Right, Medial Cleanser Peri-Wound Care Topical Primary Dressing Secondary Dressing Secured With Compression Wrap Compression Stockings Add-Ons Electronic Signature(s) Signed: 09/25/2022 10:01:46 AM By: Fredirick Maudlin MD FACS Entered By: Fredirick Maudlin on 09/25/2022 10:01:46 -------------------------------------------------------------------------------- Multi-Disciplinary Care Plan Details Patient Name: Date of Service: [name], [name] 09/25/2022 9:45 A M Medical Record Number:  [id number] Patient Account Number: [id number] Date of Birth/Sex: Treating RN: February 18, 1951 (71 y.o. Waldron Session Primary Care Ephram Kornegay: [name] Other Clinician: Referring Zebastian Carico: Treating Ashlin Kreps/Extender: [name] Weeks in Treatment: 69 Active Inactive Electronic Signature(s) Signed: 09/25/2022 4:21:20 PM By: Blanche East RN Entered By: Blanche East on 09/25/2022 09:57:20 -------------------------------------------------------------------------------- Pain Assessment Details Patient Name: Date of Service: [name] 09/25/2022 9:45 A M Medical Record Number: [id number] Patient Account Number: [id number] Date of Birth/Sex: Treating RN: 10/16/1951 (71 y.o. Waldron Session Primary Care Jaycelynn Knickerbocker: [name] Other Clinician: Referring Aydn Ferrara: Treating Mozes Sagar/Extender: [name] Weeks in Treatment: 23 Active Problems Location of Pain Severity and Description of Pain Patient Has Paino No Site Locations Pain Management and Medication Current Pain Management: Electronic Signature(s) Signed: 09/25/2022 4:21:20 PM By: Blanche East RN Entered By: Blanche East on 09/25/2022 09:46:35 -------------------------------------------------------------------------------- Patient/Caregiver Education Details Patient Name: Date of Service: Marcus Murlean Hark 10/2/2023andnbsp9:45 A M Medical Record Number: [id number] Patient Account Number: [id number] Date of Birth/Gender: Treating RN: [birth year] (71 y.o. Waldron Session Primary Care Physician: [name] Other Clinician: Referring Physician: Treating Physician/Extender: Casandra Doffing in Treatment: 48 Education Assessment Education Provided To: Patient Education Topics Provided Wound/Skin Impairment: Engineer, maintenance) Signed: 09/25/2022 4:21:20 PM By: Blanche East RN Entered By: Blanche East on 09/25/2022  09:57:54 -------------------------------------------------------------------------------- Wound Assessment Details Patient Name: Date of Service: [name] 09/25/2022 9:45 A M Medical Record Number: [id number] Patient Account Number: [id number] Date of Birth/Sex: Treating RN: 1951-05-14 (71 y.o. Waldron Session Primary Care Rashea Hoskie: [name] Other Clinician: Referring Rexford Prevo: Treating Kyrstin Campillo/Extender: [name] Weeks in Treatment: 23 Wound Status Wound Number: 6 Primary Trauma, Other Etiology: Wound Location: Right, Medial Lower Leg Wound Healed - Epithelialized Wounding Event: Trauma Status: Date Acquired: 08/25/2022 Comorbid Cataracts, Arrhythmia, Coronary Artery Disease, Hypertension, Weeks Of Treatment: 3 History: Myocardial Infarction, Peripheral Arterial Disease, Peripheral Venous Clustered Wound: No Disease, Type II Diabetes, Osteoarthritis, Neuropathy, Received Radiation Photos Wound Measurements Length: (cm) Width: (cm) Depth: (cm) Area: (cm) Volume: (cm) 0 % Reduction in Area: 97.5% 0 % Reduction in Volume: 96.8% 0 Epithelialization: None 0 Tunneling: No 0 Undermining: No Wound Description Classification: Full Thickness Without Exposed Support Structures Wound Margin: Flat and Intact Exudate Amount:  None Present Foul Odor After Cleansing: No Slough/Fibrino No Wound Bed Granulation Amount: Large (67-100%) Exposed Structure Granulation Quality: Pink Fascia Exposed: No Necrotic Amount: None Present (0%) Fat Layer (Subcutaneous Tissue) Exposed: Yes Tendon Exposed: No Muscle Exposed: No Joint Exposed: No Bone Exposed: No Treatment Notes Wound #6 (Lower Leg) Wound Laterality: Right, Medial Cleanser Peri-Wound Care Topical Primary Dressing Secondary Dressing Secured With Compression Wrap Compression Stockings Add-Ons Electronic Signature(s) Signed: 09/25/2022 4:21:20 PM By: Blanche East RN Entered By:  Blanche East on 09/25/2022 09:53:18 -------------------------------------------------------------------------------- [place] Details Patient Name: Date of Service: [name], [name] 09/25/2022 9:45 A M Medical Record Number: [id number] Patient Account Number: [id number] Date of Birth/Sex: Treating RN: 11-28-1951 (71 y.o. Waldron Session Primary Care Dayshia Ballinas: [name] Other Clinician: Referring Lesieli Bresee: Treating Carmencita Cusic/Extender: Casandra Doffing in Treatment: 23 Vital Signs Time Taken: 09:43 Temperature (F): 98.2 Height (in): 70 Pulse (bpm): 66 Weight (lbs): 219 Respiratory Rate (breaths/min): 18 Body Mass Index (BMI): 31.4 Blood Pressure (mmHg): 123/80 Reference Range: 80 - 120 mg / dl Electronic Signature(s) Signed: 09/25/2022 4:21:20 PM By: Blanche East RN Entered By: Blanche East on 09/25/2022 09:43:40

## 2022-09-25 NOTE — Progress Notes (Signed)
EMAAD, NANNA ([id number]) . Visit Report for 09/25/2022 Chief Complaint Document Details Patient Name: Date of Service: [name]. 09/25/2022 9:45 A M Medical Record Number: [id number] Patient Account Number: [id number] Date of Birth/Sex: Treating RN: 1951/01/16 (71 y.o. M) Primary Care Provider: [name] Other Clinician: Referring Provider: Treating Provider/Extender: Casandra Doffing in Treatment: 23 Information Obtained from: Patient Chief Complaint Patient seen for complaints of Non-Healing Wounds. Electronic Signature(s) Signed: 09/25/2022 10:01:52 AM By: Fredirick Maudlin MD FACS Entered By: Fredirick Maudlin on 09/25/2022 10:01:52 -------------------------------------------------------------------------------- HPI Details Patient Name: Date of Service: [name], Marcus [name] 09/25/2022 9:45 A M Medical Record Number: [id number] Patient Account Number: [id number] Date of Birth/Sex: Treating RN: [birth year] (71 y.o. M) Primary Care Provider: [name] Other Clinician: Referring Provider: Treating Provider/Extender: Casandra Doffing in Treatment: 23 History of Present Illness HPI Description: 11/10/2020 patient presents today for initial evaluation in this clinic although I have seen this patient in [place] previous. Subsequently his issue today is different from when I saw him for I was treating him for a toe ulcer. Currently he is having issues with bilateral lower extremity ulcers after running into a metal bar. Upon inspection today he has wounds of the bilateral lower extremities which are consistent with having struck his legs on the anterior aspect. With that being said the patient did go to the ER for evaluation on October 11 where he was given Keflex initially. He went back to the ER after not improving on October 20 he was given Lasix at that point he states the Lasix seem to help the most. Has been using  Neosporin and leaving this open area which is probably not the best way to go either to be honest. He has had arterial studies on March 2021 which showed a left ABI of 0.78 with a TBI of 0.34 and a right ABI of 1.18 with a TBI 1.18. Nonetheless his arterial flow the left is not ideal but also I think potentially consistent with allowing this to heal. I think he can also support light compression with Kerlix and Coban based on this result. His most recent hemoglobin A1c that we could find was 7.3 and that was towards the end of 2020. The patient does have a history of heart disease unfortunately. He is also currently still a smoker. 11/24/2020 on evaluation today patient appears to be doing well with regard to his wounds. In fact both appear to be doing better after last week's rather last visit's debridement. Fortunately there is no signs of active infection at this time. No fevers, chills, nausea, vomiting, or diarrhea. Overall I am very pleased with where things stand today. 12/01/2020 on evaluation today patient appears to be doing well with regard to his leg ulcers. He has been tolerating the dressing changes without complication. Fortunately there is no signs of active infection at this time. No fevers, chills, nausea, vomiting, or diarrhea. 12/08/2020 on evaluation today patient appears to be doing very well in regard to his bilateral lower extremity ulcers. They seem to be doing excellent and he is making great progress. There does not appear to be any evidence of active infection at this time. No fevers, chills, nausea, vomiting, or diarrhea. 12/15/2020 upon evaluation today patient actually appears to be doing excellent in regard to his wounds. Has been tolerating the dressing changes without complication. Fortunately his wounds are showing signs of great improvement were using Hydrofera Blue. 12/29/2020 on  evaluation today patient appears to be doing well with regard to his right leg which is  completely healed. His left leg is also measuring smaller than not healed this is doing very well. Fortunately there is no signs of active infection at this time 01/05/2021 upon evaluation today patient actually appears to be making signs of improvement with regard to his left leg. I am very pleased with how things are progressing. There is no evidence of active infection at this time. No fevers, chills, nausea, vomiting, or diarrhea. Patient is extremely happy with the fact that this is doing so well 01/12/2021 upon evaluation today patient appears to be doing excellent in regard to his leg ulcer. Fortunately there is no signs of active infection at this time. No fevers, chills, nausea, vomiting, or diarrhea. He has been tolerating the dressing changes without complication. 01/19/2021 upon evaluation today patient appears to be doing well with regard to his wound. In fact this appears to be completely healed on the left leg. This is brand-new skin but overall seems to be doing quite well which is great news. No fevers, chills, nausea, vomiting, or diarrhea. READMISSION 04/17/2022 This is a patient has been seen on a number of occasions in the wound care center. He is 71 years old and has type 2 diabetes mellitus, coronary artery disease, chronic venous insufficiency, and peripheral arterial disease with recent vascular studies demonstrating a right TBI of 0.9 (normal) and a left great toe TBI of 0.33. 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 He recently dropped an object and it hit him in the left shin. He then bumped the same leg against his wagon. He now has 2 wounds on his left anterior lower leg. He says that he applied Aquaphor to keep the areas moist and made an appointment in the wound care center for follow-up. 04/24/2022: Today, he is here with what he describes as 10 out of 10 pain in his wounds. Both of them measured a little bit larger today, but this may be secondary to the  debridement that was performed last week. He says that due to it being spring season, he is on his feet quite a bit more. The leg is red and warm. We have been using silver alginate with Kerlix and Coban wraps. 05/01/2022: PCR culture taken last week grew out MRSA. I prescribed doxycycline. He has been taking this. Both wounds are a bit smaller today and somewhat less painful. They continue to accumulate a bit of slough. 05/08/2022: He has completed his course of oral doxycycline. We have been using mupirocin under silver alginate with Kerlix and Coban wrapping. Today, both wounds are a bit smaller but have accumulated slough on the surface. They are less tender. 05/23/2022: Both wounds are quite a bit more shallow today. There is a little bit of slough accumulation, but there is also good granulation tissue as well as some buds of epithelialized tissue coming through the wound. No surrounding erythema, induration, nor any purulent drainage. We have been using mupirocin with silver alginate and Kerlix/Coban wrapping. 05/29/2022: The more anterior wound continues to demonstrate encroaching epithelium and the remaining open portion has good granulation tissue. The more lateral wound is nearly closed with just a pinpoint opening. No concern for infection. 06/05/2022: The lateral wound has healed. The more anterior wound is much smaller today with just a little bit of surrounding eschar. The wound surface is healthy-appearing with good granulation tissue. 06/13/2022: The wound on the anterior tibial  surface on the left is nearly closed. There is just a tiny opening underneath some eschar. Unfortunately, his dog jumped up on him and cut his right lower leg. There is a flap of nonviable skin with some hematoma and bruising. No surrounding erythema or induration. No purulent discharge. 06/19/2022: The wound on his left leg is closed. The right sided wound is smaller, but there is significant periwound erythema and  the leg is more tender. There is slough accumulation on the wound surface. 06/26/2022: The right sided wound is about the same. It is tender but there is less periwound erythema. He still has slough on the wound surface. He has been taking Bactrim as prescribed. 07/03/2022: The PCR culture that I took demonstrated bacteria not sensitive to Bactrim so he was prescribed levofloxacin, as supported by the culture data. He has been taking that and there has been significant improvement in his degree of pain. The wound itself is smaller with less slough accumulation. There is good granulation tissue emerging. 07/10/2022: The wound has contracted substantially and there is a lot of epithelialized tissue budding through. There is a small amount of slough on the surface. No concern for infection. 07/17/2022: The wound continues to contract and is down to just a fairly small open lesion. There is slough on the wound surface but underneath, the granulation tissue is healthy. No concern for infection. 07/24/2022: The wound is down to just a small slit. There is just a light layer of slough and some perimeter eschar present. 07/31/2022: The wound continues to contract. There is just some eschar overlying the surface. The underlying granulation tissue is robust. 08/07/2022: His wound has healed. READMISSION 09/04/2022 He returns today after suffering a laceration from his dog's toenail on his right medial lower leg. He says he has been trying to care for it at home but it has been draining so much fluid that he cannot keep up. No fevers or chills. No purulent discharge or odor. The laceration is about a centimeter and has some slough buildup in the base. 09/11/2022: He still had a bit of drainage from the wound but it was serosanguineous. It is smaller today by about half with just a little bit of slough in the base. 09/18/2022: His wound is down to just a couple of millimeters and is very clean without any slough  accumulation. 09/25/2022: His wound is healed. Electronic Signature(s) Signed: 09/25/2022 10:02:12 AM By: Fredirick Maudlin MD FACS Entered By: Fredirick Maudlin on 09/25/2022 10:02:12 -------------------------------------------------------------------------------- Physical Exam Details Patient Name: Date of Service: Marcus Milliner T. 09/25/2022 9:45 A M Medical Record Number: [id number] Patient Account Number: [id number] Date of Birth/Sex: Treating RN: 10-28-1951 (71 y.o. M) Primary Care Provider: [name] Other Clinician: Referring Provider: Treating Provider/Extender: [name] Weeks in Treatment: 23 Constitutional . . . . No acute distress.Marland Kitchen Respiratory Normal work of breathing on room air.. Notes 09/25/2022: His wound is healed. Electronic Signature(s) Signed: 09/25/2022 10:02:38 AM By: Fredirick Maudlin MD FACS Entered By: Fredirick Maudlin on 09/25/2022 10:02:38 -------------------------------------------------------------------------------- Physician Orders Details Patient Name: Date of Service: [name], Marcus [name] 09/25/2022 9:45 A M Medical Record Number: [id number] Patient Account Number: [id number] Date of Birth/Sex: Treating RN: [birth year] (71 y.o. Waldron Session Primary Care Provider: [name] Other Clinician: Referring Provider: Treating Provider/Extender: Casandra Doffing in Treatment: 39 Verbal / Phone Orders: No Diagnosis Coding ICD-10 Coding Code Description E11.622 Type 2 diabetes mellitus with other skin ulcer I73.9  Peripheral vascular disease, unspecified I87.2 Venous insufficiency (chronic) (peripheral) L97.812 Non-pressure chronic ulcer of other part of right lower leg with fat layer exposed Discharge From [facility] Discharge from [place]!! Bathing/ Shower/ Hygiene May shower and wash wound with soap and water. Edema Control - Lymphedema / SCD / Other Bilateral  Lower Extremities Elevate legs to the level of the heart or above for 30 minutes daily and/or when sitting, a frequency of: Avoid standing for long periods of time. Patient to wear own compression stockings every day. - On left leg Exercise regularly Moisturize legs daily. Electronic Signature(s) Signed: 09/25/2022 10:02:53 AM By: Fredirick Maudlin MD FACS Entered By: Fredirick Maudlin on 09/25/2022 10:02:53 -------------------------------------------------------------------------------- Problem List Details Patient Name: Date of Service: [name], Marcus [name] 09/25/2022 9:45 A M Medical Record Number: [id number] Patient Account Number: [id number] Date of Birth/Sex: Treating RN: [birth year] (71 y.o. M) Primary Care Provider: [name] Other Clinician: Referring Provider: Treating Provider/Extender: Casandra Doffing in Treatment: 23 Active Problems ICD-10 Encounter Code Description Active Date MDM Diagnosis E11.622 Type 2 diabetes mellitus with other skin ulcer 04/17/2022 No Yes I73.9 Peripheral vascular disease, unspecified 04/17/2022 No Yes I87.2 Venous insufficiency (chronic) (peripheral) 04/17/2022 No Yes L97.812 Non-pressure chronic ulcer of other part of right lower leg with fat layer 09/04/2022 No Yes exposed Inactive Problems Resolved Problems ICD-10 Code Description Active Date Resolved Date L97.822 Non-pressure chronic ulcer of other part of left lower leg with fat layer exposed 04/17/2022 04/17/2022 Electronic Signature(s) Signed: 09/25/2022 10:01:41 AM By: Fredirick Maudlin MD FACS Entered By: Fredirick Maudlin on 09/25/2022 10:01:41 -------------------------------------------------------------------------------- Progress Note Details Patient Name: Date of Service: Marcus Army Fossa T. 09/25/2022 9:45 A M Medical Record Number: [id number] Patient Account Number: [id number] Date of Birth/Sex: Treating RN: October 17, 1951 (71 y.o. M) Primary Care Provider:  [name] Other Clinician: Referring Provider: Treating Provider/Extender: Casandra Doffing in Treatment: 66 Subjective Chief Complaint Information obtained from Patient Patient seen for complaints of Non-Healing Wounds. History of Present Illness (HPI) 11/10/2020 patient presents today for initial evaluation in this clinic although I have seen this patient in [place] previous. Subsequently his issue today is different from when I saw him for I was treating him for a toe ulcer. Currently he is having issues with bilateral lower extremity ulcers after running into a metal bar. Upon inspection today he has wounds of the bilateral lower extremities which are consistent with having struck his legs on the anterior aspect. With that being said the patient did go to the ER for evaluation on October 11 where he was given Keflex initially. He went back to the ER after not improving on October 20 he was given Lasix at that point he states the Lasix seem to help the most. Has been using Neosporin and leaving this open area which is probably not the best way to go either to be honest. He has had arterial studies on March 2021 which showed a left ABI of 0.78 with a TBI of 0.34 and a right ABI of 1.18 with a TBI 1.18. Nonetheless his arterial flow the left is not ideal but also I think potentially consistent with allowing this to heal. I think he can also support light compression with Kerlix and Coban based on this result. His most recent hemoglobin A1c that we could find was 7.3 and that was towards the end of 2020. The patient does have a history of heart disease unfortunately. He is  also currently still a smoker. 11/24/2020 on evaluation today patient appears to be doing well with regard to his wounds. In fact both appear to be doing better after last week's rather last visit's debridement. Fortunately there is no signs of active infection at this time. No fevers, chills,  nausea, vomiting, or diarrhea. Overall I am very pleased with where things stand today. 12/01/2020 on evaluation today patient appears to be doing well with regard to his leg ulcers. He has been tolerating the dressing changes without complication. Fortunately there is no signs of active infection at this time. No fevers, chills, nausea, vomiting, or diarrhea. 12/08/2020 on evaluation today patient appears to be doing very well in regard to his bilateral lower extremity ulcers. They seem to be doing excellent and he is making great progress. There does not appear to be any evidence of active infection at this time. No fevers, chills, nausea, vomiting, or diarrhea. 12/15/2020 upon evaluation today patient actually appears to be doing excellent in regard to his wounds. Has been tolerating the dressing changes without complication. Fortunately his wounds are showing signs of great improvement were using Hydrofera Blue. 12/29/2020 on evaluation today patient appears to be doing well with regard to his right leg which is completely healed. His left leg is also measuring smaller than not healed this is doing very well. Fortunately there is no signs of active infection at this time 01/05/2021 upon evaluation today patient actually appears to be making signs of improvement with regard to his left leg. I am very pleased with how things are progressing. There is no evidence of active infection at this time. No fevers, chills, nausea, vomiting, or diarrhea. Patient is extremely happy with the fact that this is doing so well 01/12/2021 upon evaluation today patient appears to be doing excellent in regard to his leg ulcer. Fortunately there is no signs of active infection at this time. No fevers, chills, nausea, vomiting, or diarrhea. He has been tolerating the dressing changes without complication. 01/19/2021 upon evaluation today patient appears to be doing well with regard to his wound. In fact this appears to be  completely healed on the left leg. This is brand-new skin but overall seems to be doing quite well which is great news. No fevers, chills, nausea, vomiting, or diarrhea. READMISSION 04/17/2022 This is a patient has been seen on a number of occasions in the wound care center. He is 71 years old and has type 2 diabetes mellitus, coronary artery disease, chronic venous insufficiency, and peripheral arterial disease with recent vascular studies demonstrating a right TBI of 0.9 (normal) and a left great toe TBI of 0.33. 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 He recently dropped an object and it hit him in the left shin. He then bumped the same leg against his wagon. He now has 2 wounds on his left anterior lower leg. He says that he applied Aquaphor to keep the areas moist and made an appointment in the wound care center for follow-up. 04/24/2022: Today, he is here with what he describes as 10 out of 10 pain in his wounds. Both of them measured a little bit larger today, but this may be secondary to the debridement that was performed last week. He says that due to it being spring season, he is on his feet quite a bit more. The leg is red and warm. We have been using silver alginate with Kerlix and Coban wraps. 05/01/2022: PCR culture taken last week grew out  MRSA. I prescribed doxycycline. He has been taking this. Both wounds are a bit smaller today and somewhat less painful. They continue to accumulate a bit of slough. 05/08/2022: He has completed his course of oral doxycycline. We have been using mupirocin under silver alginate with Kerlix and Coban wrapping. Today, both wounds are a bit smaller but have accumulated slough on the surface. They are less tender. 05/23/2022: Both wounds are quite a bit more shallow today. There is a little bit of slough accumulation, but there is also good granulation tissue as well as some buds of epithelialized tissue coming through the wound. No surrounding  erythema, induration, nor any purulent drainage. We have been using mupirocin with silver alginate and Kerlix/Coban wrapping. 05/29/2022: The more anterior wound continues to demonstrate encroaching epithelium and the remaining open portion has good granulation tissue. The more lateral wound is nearly closed with just a pinpoint opening. No concern for infection. 06/05/2022: The lateral wound has healed. The more anterior wound is much smaller today with just a little bit of surrounding eschar. The wound surface is healthy-appearing with good granulation tissue. 06/13/2022: The wound on the anterior tibial surface on the left is nearly closed. There is just a tiny opening underneath some eschar. Unfortunately, his dog jumped up on him and cut his right lower leg. There is a flap of nonviable skin with some hematoma and bruising. No surrounding erythema or induration. No purulent discharge. 06/19/2022: The wound on his left leg is closed. The right sided wound is smaller, but there is significant periwound erythema and the leg is more tender. There is slough accumulation on the wound surface. 06/26/2022: The right sided wound is about the same. It is tender but there is less periwound erythema. He still has slough on the wound surface. He has been taking Bactrim as prescribed. 07/03/2022: The PCR culture that I took demonstrated bacteria not sensitive to Bactrim so he was prescribed levofloxacin, as supported by the culture data. He has been taking that and there has been significant improvement in his degree of pain. The wound itself is smaller with less slough accumulation. There is good granulation tissue emerging. 07/10/2022: The wound has contracted substantially and there is a lot of epithelialized tissue budding through. There is a small amount of slough on the surface. No concern for infection. 07/17/2022: The wound continues to contract and is down to just a fairly small open lesion. There is slough  on the wound surface but underneath, the granulation tissue is healthy. No concern for infection. 07/24/2022: The wound is down to just a small slit. There is just a light layer of slough and some perimeter eschar present. 07/31/2022: The wound continues to contract. There is just some eschar overlying the surface. The underlying granulation tissue is robust. 08/07/2022: His wound has healed. READMISSION 09/04/2022 He returns today after suffering a laceration from his dog's toenail on his right medial lower leg. He says he has been trying to care for it at home but it has been draining so much fluid that he cannot keep up. No fevers or chills. No purulent discharge or odor. The laceration is about a centimeter and has some slough buildup in the base. 09/11/2022: He still had a bit of drainage from the wound but it was serosanguineous. It is smaller today by about half with just a little bit of slough in the base. 09/18/2022: His wound is down to just a couple of millimeters and is very clean without any slough  accumulation. 09/25/2022: His wound is healed. Patient History Information obtained from Patient. Social History Current every day smoker - 1 PPD, Marital Status - Married, Alcohol Use - Rarely, Drug Use - No History, Caffeine Use - Daily - coffee, soda. Medical History Eyes Patient has history of Cataracts - both eyes Cardiovascular Patient has history of Arrhythmia - A-fib, Coronary Artery Disease, Hypertension, Myocardial Infarction - CABG x 4, Peripheral Arterial Disease, Peripheral Venous Disease Endocrine Patient has history of Type II Diabetes Musculoskeletal Patient has history of Osteoarthritis Neurologic Patient has history of Neuropathy Oncologic Patient has history of Received Radiation Medical A Surgical History Notes nd Cardiovascular Hyperlipidemia, CVA, AAA Oncologic Prostate cancer Objective Constitutional No acute distress.. Vitals Time Taken: 9:43 AM, Height:  70 in, Weight: 219 lbs, BMI: 31.4, Temperature: 98.2 F, Pulse: 66 bpm, Respiratory Rate: 18 breaths/min, Blood Pressure: 123/80 mmHg. Respiratory Normal work of breathing on room air.. General Notes: 09/25/2022: His wound is healed. Integumentary (Hair, Skin) Wound #6 status is Healed - Epithelialized. Original cause of wound was Trauma. The date acquired was: 08/25/2022. The wound has been in treatment 3 weeks. The wound is located on the Right,Medial Lower Leg. The wound measures 0cm length x 0cm width x 0cm depth; 0cm^2 area and 0cm^3 volume. There is Fat Layer (Subcutaneous Tissue) exposed. There is no tunneling or undermining noted. There is a none present amount of drainage noted. The wound margin is flat and intact. There is large (67-100%) pink granulation within the wound bed. There is no necrotic tissue within the wound bed. Assessment Active Problems ICD-10 Type 2 diabetes mellitus with other skin ulcer Peripheral vascular disease, unspecified Venous insufficiency (chronic) (peripheral) Non-pressure chronic ulcer of other part of right lower leg with fat layer exposed Plan [facility]charge From [facility]: Discharge from [facility] Bathing/ Shower/ Hygiene: May shower and wash wound with soap and water. Edema Control - Lymphedema / SCD / Other: Elevate legs to the level of the heart or above for 30 minutes daily and/or when sitting, a frequency of: Avoid standing for long periods of time. Patient to wear own compression stockings every day. - On left leg Exercise regularly Moisturize legs daily. 09/25/2022: His wound is healed. He was reminded to elevate his legs as much as possible and wear his compression stockings religiously. We will discharge him from the wound care center and he may follow-up as needed. Electronic Signature(s) Signed: 09/25/2022 10:03:28 AM By: Fredirick Maudlin MD FACS Entered By: Fredirick Maudlin on 09/25/2022  10:03:27 -------------------------------------------------------------------------------- HxROS Details Patient Name: Date of Service: [name], Marcus [name] 09/25/2022 9:45 A M Medical Record Number: [id number] Patient Account Number: [id number] Date of Birth/Sex: Treating RN: [birth year] (71 y.o. M) Primary Care Provider: [name] Other Clinician: Referring Provider: Treating Provider/Extender: Casandra Doffing in Treatment: 23 Information Obtained From Patient Eyes Medical History: Positive for: Cataracts - both eyes Cardiovascular Medical History: Positive for: Arrhythmia - A-fib; Coronary Artery Disease; Hypertension; Myocardial Infarction - CABG x 4; Peripheral Arterial Disease; Peripheral Venous Disease Past Medical History Notes: Hyperlipidemia, CVA, AAA Endocrine Medical History: Positive for: Type II Diabetes Time with diabetes: over 5 years Treated with: Oral agents Blood sugar tested every day: No Musculoskeletal Medical History: Positive for: Osteoarthritis Neurologic Medical History: Positive for: Neuropathy Oncologic Medical History: Positive for: Received Radiation Past Medical History Notes: Prostate cancer HBO Extended History Items Eyes: Cataracts Immunizations Pneumococcal Vaccine: Received Pneumococcal Vaccination: Yes Received Pneumococcal Vaccination On or After 60th  Birthday: Yes Implantable Devices None Family and Social History Current every day smoker - 1 PPD; Marital Status - Married; Alcohol Use: Rarely; Drug Use: No History; Caffeine Use: Daily - coffee, soda; Financial Concerns: No; Food, Clothing or Shelter Needs: No; Support System Lacking: No; Transportation Concerns: No Electronic Signature(s) Signed: 09/25/2022 12:03:04 PM By: Fredirick Maudlin MD FACS Entered By: Fredirick Maudlin on 09/25/2022 10:02:19 -------------------------------------------------------------------------------- SuperBill  Details Patient Name: Date of Service: [name], Marcus [name] 09/25/2022 Medical Record Number: [id number] Patient Account Number: [id number] Date of Birth/Sex: Treating RN: [birth year] (71 y.o. Waldron Session Primary Care Provider: [name] Other Clinician: Referring Provider: Treating Provider/Extender: [name] Weeks in Treatment: 23 Diagnosis Coding ICD-10 Codes Code Description E11.622 Type 2 diabetes mellitus with other skin ulcer I73.9 Peripheral vascular disease, unspecified I87.2 Venous insufficiency (chronic) (peripheral) L97.812 Non-pressure chronic ulcer of other part of right lower leg with fat layer exposed Facility Procedures CPT4 Code: 40981191 Description: 99213 - WOUND CARE VISIT-LEV 3 EST PT Modifier: 25 Quantity: 1 Physician Procedures : CPT4 Code Description Modifier [id number] - WC PHYS LEVEL 3 - EST PT ICD-10 Diagnosis Description M57.846 Non-pressure chronic ulcer of other part of right lower leg with fat layer exposed I87.2 Venous insufficiency (chronic) (peripheral) E11.622  Type 2 diabetes mellitus with other skin ulcer I73.9 Peripheral vascular disease, unspecified Quantity: 1 Electronic Signature(s) Signed: 09/25/2022 10:03:44 AM By: Fredirick Maudlin MD FACS Entered By: Fredirick Maudlin on 09/25/2022 10:03:44

## 2022-09-25 NOTE — Assessment & Plan Note (Signed)
-  Stable with gabapentin 300 mg 2 capsules BID. Will continue to monitor.

## 2022-09-25 NOTE — Patient Instructions (Signed)

## 2022-09-25 NOTE — Assessment & Plan Note (Signed)
-  Followed by cardiology. On anticoagulant with Eliquis.

## 2022-09-25 NOTE — Progress Notes (Signed)
Established patient visit   Patient: [name]   DOB: 25-May-1951   71 y.o. Male  MRN: [id number] Visit Date: 09/25/2022  Chief Complaint  Patient presents with   Follow-up    Surgical clearance   Subjective    HPI HPI     Follow-up    Additional comments: Surgical clearance      Last edited by Adelfa Koh, CMA on 09/25/2022  8:20 AM.      Patient with a past medical history of type 2 diabetes mellitus, hypertension, hyperlipidemia, atrial fibrillation, prostate cancer, neuropathy, venous insufficiency, and emphysema presents for surgical clearance for total knee replacement of right knee. Denies chest pain, shortness of breath, palpitations, dizziness, syncope, pain with urination, or syncope. Denies prior surgical complications or anesthesia issues. Patient reports sugars have ben running in the 115s. Notices sugars might go up with certain meals such as potatoes. States diarrhea has been better with Metformin XR 500 mg daily. Patient reports was on started on a medication to help with stop smoking, is down to 2 cigarettes/day.         Medications: Outpatient Medications Prior to Visit  Medication Sig   apixaban (ELIQUIS) 5 MG TABS tablet Take 1 tablet (5 mg total) by mouth 2 (two) times daily.   atorvastatin (LIPITOR) 10 MG tablet Take 1 tablet (10 mg total) by mouth daily.   blood glucose meter kit and supplies KIT Dispense based on patient and insurance preference.   Continuous Blood Gluc Receiver (FREESTYLE LIBRE 14 DAY READER) DEVI 1 Device by Does not apply route daily. Use to check blood sugars every morning and 2 hours after largest meals   Continuous Blood Gluc Sensor (FREESTYLE LIBRE 14 DAY SENSOR) MISC USE AS DIRECTED   diclofenac Sodium (VOLTAREN) 1 % GEL Apply 2 g topically 4 (four) times daily as needed.   finasteride (PROSCAR) 5 MG tablet Take 5 mg by mouth daily.   furosemide (LASIX) 20 MG tablet Take 1 tablet (20 mg total) by mouth daily as  needed for edema.   gabapentin (NEURONTIN) 300 MG capsule TAKE 2 CAPSULES BY MOUTH IN THE MORNING AND 2 IN THE EVENING WITH  DINNER   glucose blood (FREESTYLE LITE) test strip Use to check blood sugars every morning fasting   lisinopril (ZESTRIL) 5 MG tablet Take 1 tablet by mouth once daily   methocarbamol (ROBAXIN) 500 MG tablet Take 1 tablet (500 mg total) by mouth every 8 (eight) hours as needed for muscle spasms.   mirabegron ER (MYRBETRIQ) 50 MG TB24 tablet Take 1 tablet (50 mg total) by mouth daily.   Multiple Vitamin (MULTIVITAMIN WITH MINERALS) TABS tablet Take 1 tablet by mouth daily. Men's One-A-Day Multivitamin   oxybutynin (DITROPAN-XL) 5 MG 24 hr tablet Take 5 mg by mouth at bedtime.   tamsulosin (FLOMAX) 0.4 MG CAPS capsule Take 1 capsule by mouth once daily at bedtime   Varenicline Tartrate, Starter, (CHANTIX STARTING MONTH PAK) 0.5 MG X 11 & 1 MG X 42 TBPK Take 0.5 mg by mouth daily for 3 days, THEN 0.5 mg daily for 4 days, THEN 1 mg daily.   [DISCONTINUED] glipiZIDE (GLUCOTROL) 5 MG tablet Take 1 tablet (5 mg total) by mouth daily before supper.   [DISCONTINUED] metFORMIN (GLUCOPHAGE-XR) 500 MG 24 hr tablet Take 1 tablet (500 mg total) by mouth daily with breakfast.   [DISCONTINUED] mupirocin cream (BACTROBAN) 2 % Apply 1 application topically 2 (two) times daily.   No facility-administered medications prior  to visit.    Review of Systems Review of Systems:  A fourteen system review of systems was performed and found to be positive as per HPI.  Last CBC Lab Results  Component Value Date   WBC 11.3 (H) 03/21/2022   HGB 15.0 03/21/2022   HCT 44.5 03/21/2022   MCV 85 03/21/2022   MCH 28.7 03/21/2022   RDW 14.8 03/21/2022   PLT 221 40/07/6760   Last metabolic panel Lab Results  Component Value Date   GLUCOSE 188 (H) 03/21/2022   NA 136 03/21/2022   K 4.5 03/21/2022   CL 101 03/21/2022   CO2 22 03/21/2022   BUN 13 03/21/2022   CREATININE 0.72 (L) 03/21/2022    EGFR 98 03/21/2022   CALCIUM 9.2 03/21/2022   PROT 6.7 03/21/2022   ALBUMIN 4.4 03/21/2022   LABGLOB 2.3 03/21/2022   AGRATIO 1.9 03/21/2022   BILITOT 0.4 03/21/2022   ALKPHOS 136 (H) 03/21/2022   AST 14 03/21/2022   ALT 16 03/21/2022   ANIONGAP 11 09/04/2018   Last lipids Lab Results  Component Value Date   CHOL 77 (L) 03/21/2022   HDL 28 (L) 03/21/2022   LDLCALC 31 03/21/2022   TRIG 87 03/21/2022   CHOLHDL 2.8 03/21/2022   Last hemoglobin A1c Lab Results  Component Value Date   HGBA1C 7.9 (A) 09/25/2022   Last thyroid functions Lab Results  Component Value Date   TSH 1.610 03/21/2022   Last vitamin D No results found for: "25OHVITD2", "25OHVITD3", "VD25OH"   Objective    BP 125/79   Pulse 65   Temp (!) 97.3 F (36.3 C) (Temporal)   Ht _0  (1.778 m)   Wt 234 lb (106.1 kg)   BMI 33.58 kg/m  BP Readings from Last 3 Encounters:  09/25/22 125/79  08/15/22 111/66  06/19/22 (!) 93/44   Wt Readings from Last 3 Encounters:  09/25/22 234 lb (106.1 kg)  08/15/22 220 lb (99.8 kg)  06/19/22 222 lb 12.8 oz (101.1 kg)    Physical Exam Constitutional:      Appearance: Normal appearance.  HENT:     Head: Normocephalic and atraumatic.     Right Ear: Tympanic membrane normal.     Left Ear: Tympanic membrane normal.     Nose: Nose normal. No rhinorrhea.     Mouth/Throat:     Mouth: Mucous membranes are moist.     Pharynx: No oropharyngeal exudate or posterior oropharyngeal erythema.  Eyes:     Extraocular Movements: Extraocular movements intact.     Conjunctiva/sclera: Conjunctivae normal.     Pupils: Pupils are equal, round, and reactive to light.  Cardiovascular:     Rate and Rhythm: Normal rate and regular rhythm.     Pulses: Normal pulses.     Heart sounds: Normal heart sounds.  Pulmonary:     Effort: Pulmonary effort is normal.     Breath sounds: Normal breath sounds.  Abdominal:     General: Bowel sounds are normal. There is no distension.      Palpations: Abdomen is soft.     Tenderness: There is no right CVA tenderness, left CVA tenderness or guarding.  Musculoskeletal:     Cervical back: Normal range of motion and neck supple.     Comments: Right knee tenderness  Skin:    General: Skin is warm and dry.  Neurological:     General: No focal deficit present.     Mental Status: He is alert.  Psychiatric:  Mood and Affect: Mood normal.        Behavior: Behavior normal.       Results for orders placed or performed in visit on 09/25/22  POCT HgB A1C  Result Value Ref Range   Hemoglobin A1C 7.9 (A) 4.0 - 5.6 %   HbA1c POC (<> result, manual entry)     HbA1c, POC (prediabetic range)     HbA1c, POC (controlled diabetic range)      Assessment & Plan      Problem List Items Addressed This Visit       Cardiovascular and Mediastinum   Hypertension associated with diabetes ([place])    -BP elevated on intake, repeated with improvement and stable. Continue Lisinopril 5 mg daily.      Relevant Medications   glipiZIDE (GLUCOTROL) 5 MG tablet   metFORMIN (GLUCOPHAGE-XR) 500 MG 24 hr tablet   Paroxysmal atrial fibrillation (HCC)    -Followed by cardiology. On anticoagulant with Eliquis.        Endocrine   Diabetes mellitus type 2 in obese (HCC)   Relevant Medications   glipiZIDE (GLUCOTROL) 5 MG tablet   metFORMIN (GLUCOPHAGE-XR) 500 MG 24 hr tablet   Other Relevant Orders   POCT HgB A1C (Completed)   Mixed diabetic hyperlipidemia associated with type 2 diabetes mellitus ([place])    -Controlled. Last LDL 31. Continue atorvastatin 10 mg daily.      Relevant Medications   glipiZIDE (GLUCOTROL) 5 MG tablet   metFORMIN (GLUCOPHAGE-XR) 500 MG 24 hr tablet   Diabetic polyneuropathy associated with type 2 diabetes mellitus (HCC)    -Stable with gabapentin 300 mg 2 capsules BID. Will continue to monitor.      Relevant Medications   glipiZIDE (GLUCOTROL) 5 MG tablet   metFORMIN (GLUCOPHAGE-XR) 500 MG 24 hr tablet    Other Visit Diagnoses     Preoperative clearance    -  Primary   Encounter for tobacco use cessation counseling       History of prostate cancer          Preoperative clearance, Type 2 diabetes mellitus in obese: -Patient has been cleared by cardiology. -Discussed A1c at 7.9, <7.8 required for surgery so will not provide medical clearance until A1c 7.7 or better. Thoroughly discussed with patient a diabetic diet and will increase glipizide to 5 mg BID before meals. Continue Metformin XR 500 mg daily. Discussed to continue monitoring sugars and let me know if has any low blood sugars <70. Advised to follow-up in 4 weeks to recheck A1c. -At follow-up visit will also collect labs- CMP, CBC. -The patient was counseled on the dangers of tobacco use, and was advised to quit.  Reviewed strategies to maximize success, including stress management, substitution of other forms of reinforcement, and pharmacotherapy (Varenicline). Praised for reduction, recommend to continue medication and set a quit day soon.  History of prostate cancer: -S/p ADT and radiotherapy. Followed by Urology. On Flomax and Myrbetriq for BPH with LUTS.   Return in about 4 weeks (around 10/23/2022) for surgicl clearance- repeat A1c, BP.        [name], PA-C  [facility] at [facility] [phone] (phone) [phone] (fax)  [place]

## 2022-09-25 NOTE — Assessment & Plan Note (Signed)
-  BP elevated on intake, repeated with improvement and stable. Continue Lisinopril 5 mg daily.

## 2022-09-25 NOTE — Assessment & Plan Note (Signed)
-  Controlled. Last LDL 31. Continue atorvastatin 10 mg daily.

## 2022-10-13 ENCOUNTER — Telehealth: Payer: Self-pay | Admitting: Cardiovascular Disease

## 2022-10-13 NOTE — Telephone Encounter (Signed)
Eliquis '5mg'$  samples approved by PharmD [place]. Patient informed eliquis '5mg'$  samples are ready for him to pick up.

## 2022-10-13 NOTE — Telephone Encounter (Signed)
Patient calling the office for samples of medication:   1.  What medication and dosage are you requesting samples for? apixaban (ELIQUIS) 5 MG TABS tablet  2.  Are you currently out of this medication? Yes    

## 2022-10-16 ENCOUNTER — Other Ambulatory Visit: Payer: Self-pay | Admitting: Physician Assistant

## 2022-10-16 DIAGNOSIS — R809 Proteinuria, unspecified: Secondary | ICD-10-CM

## 2022-10-16 DIAGNOSIS — I152 Hypertension secondary to endocrine disorders: Secondary | ICD-10-CM

## 2022-10-23 ENCOUNTER — Encounter: Payer: Self-pay | Admitting: Physician Assistant

## 2022-10-23 ENCOUNTER — Ambulatory Visit (INDEPENDENT_AMBULATORY_CARE_PROVIDER_SITE_OTHER): Payer: PPO | Admitting: Physician Assistant

## 2022-10-23 VITALS — BP 120/73 | HR 73 | Ht 70.0 in | Wt 229.0 lb

## 2022-10-23 DIAGNOSIS — E669 Obesity, unspecified: Secondary | ICD-10-CM

## 2022-10-23 DIAGNOSIS — Z01818 Encounter for other preprocedural examination: Secondary | ICD-10-CM

## 2022-10-23 DIAGNOSIS — E1169 Type 2 diabetes mellitus with other specified complication: Secondary | ICD-10-CM | POA: Diagnosis not present

## 2022-10-23 DIAGNOSIS — E1159 Type 2 diabetes mellitus with other circulatory complications: Secondary | ICD-10-CM

## 2022-10-23 DIAGNOSIS — G8929 Other chronic pain: Secondary | ICD-10-CM | POA: Diagnosis not present

## 2022-10-23 DIAGNOSIS — M25561 Pain in right knee: Secondary | ICD-10-CM | POA: Diagnosis not present

## 2022-10-23 DIAGNOSIS — I152 Hypertension secondary to endocrine disorders: Secondary | ICD-10-CM | POA: Diagnosis not present

## 2022-10-23 DIAGNOSIS — M1711 Unilateral primary osteoarthritis, right knee: Secondary | ICD-10-CM | POA: Diagnosis not present

## 2022-10-23 NOTE — Progress Notes (Signed)
Established patient visit   Patient: [name]   DOB: 05/27/1951   71 y.o. Male  MRN: [id number] Visit Date: 10/23/2022  Chief Complaint  Patient presents with   Follow-up   Subjective    HPI  Patient presents for follow-up on diabetes mellitus for presurgical clearance. Patient's last A1c was not at goal required for surgery (<7.8). Patient is planning to undergo a total right knee replacement. Patient reports has been more careful about what he is eating. States his fasting sugar this morning was 115. Reports tolerating glipizide 5 mg twice daily without issues. No hypoglycemic events. No chest pain, shortness of breath, dizziness or syncope. Reports his right knee gave out this weekend so has been using crutches for ambulation.   Dr. Ronnie Derby with [facility].    Medications: Outpatient Medications Prior to Visit  Medication Sig   apixaban (ELIQUIS) 5 MG TABS tablet Take 1 tablet (5 mg total) by mouth 2 (two) times daily.   atorvastatin (LIPITOR) 10 MG tablet Take 1 tablet (10 mg total) by mouth daily.   blood glucose meter kit and supplies KIT Dispense based on patient and insurance preference.   Continuous Blood Gluc Receiver (FREESTYLE LIBRE 14 DAY READER) DEVI 1 Device by Does not apply route daily. Use to check blood sugars every morning and 2 hours after largest meals   Continuous Blood Gluc Sensor (FREESTYLE LIBRE 14 DAY SENSOR) MISC USE AS DIRECTED   diclofenac Sodium (VOLTAREN) 1 % GEL Apply 2 g topically 4 (four) times daily as needed.   finasteride (PROSCAR) 5 MG tablet Take 5 mg by mouth daily.   furosemide (LASIX) 20 MG tablet Take 1 tablet (20 mg total) by mouth daily as needed for edema.   gabapentin (NEURONTIN) 300 MG capsule TAKE 2 CAPSULES BY MOUTH IN THE MORNING AND 2 IN THE EVENING WITH  DINNER   glipiZIDE (GLUCOTROL) 5 MG tablet Take 1 tablet (5 mg total) by mouth 2 (two) times daily before a meal.   glucose blood (FREESTYLE LITE) test strip Use to check  blood sugars every morning fasting   lisinopril (ZESTRIL) 5 MG tablet Take 1 tablet by mouth once daily   metFORMIN (GLUCOPHAGE-XR) 500 MG 24 hr tablet Take 1 tablet (500 mg total) by mouth daily with breakfast.   methocarbamol (ROBAXIN) 500 MG tablet Take 1 tablet (500 mg total) by mouth every 8 (eight) hours as needed for muscle spasms.   mirabegron ER (MYRBETRIQ) 50 MG TB24 tablet Take 1 tablet (50 mg total) by mouth daily.   Multiple Vitamin (MULTIVITAMIN WITH MINERALS) TABS tablet Take 1 tablet by mouth daily. Men's One-A-Day Multivitamin   oxybutynin (DITROPAN-XL) 5 MG 24 hr tablet Take 5 mg by mouth at bedtime.   tamsulosin (FLOMAX) 0.4 MG CAPS capsule Take 1 capsule by mouth once daily at bedtime   No facility-administered medications prior to visit.    Review of Systems Review of Systems:  A fourteen system review of systems was performed and found to be positive as per HPI.  Last CBC Lab Results  Component Value Date   WBC 11.3 (H) 03/21/2022   HGB 15.0 03/21/2022   HCT 44.5 03/21/2022   MCV 85 03/21/2022   MCH 28.7 03/21/2022   RDW 14.8 03/21/2022   PLT 221 54/27/0623   Last metabolic panel Lab Results  Component Value Date   GLUCOSE 188 (H) 03/21/2022   NA 136 03/21/2022   K 4.5 03/21/2022   CL 101 03/21/2022  CO2 22 03/21/2022   BUN 13 03/21/2022   CREATININE 0.72 (L) 03/21/2022   EGFR 98 03/21/2022   CALCIUM 9.2 03/21/2022   PROT 6.7 03/21/2022   ALBUMIN 4.4 03/21/2022   LABGLOB 2.3 03/21/2022   AGRATIO 1.9 03/21/2022   BILITOT 0.4 03/21/2022   ALKPHOS 136 (H) 03/21/2022   AST 14 03/21/2022   ALT 16 03/21/2022   ANIONGAP 11 09/04/2018   Last lipids Lab Results  Component Value Date   CHOL 77 (L) 03/21/2022   HDL 28 (L) 03/21/2022   LDLCALC 31 03/21/2022   TRIG 87 03/21/2022   CHOLHDL 2.8 03/21/2022   Last hemoglobin A1c Lab Results  Component Value Date   HGBA1C 7.9 (A) 09/25/2022   Last thyroid functions Lab Results  Component Value  Date   TSH 1.610 03/21/2022     Objective    BP 120/73   Pulse 73   Ht _0  (1.778 m)   Wt 229 lb (103.9 kg)   SpO2 97%   BMI 32.86 kg/m  BP Readings from Last 3 Encounters:  10/23/22 120/73  09/25/22 125/79  08/15/22 111/66   Wt Readings from Last 3 Encounters:  10/23/22 229 lb (103.9 kg)  09/25/22 234 lb (106.1 kg)  08/15/22 220 lb (99.8 kg)    Physical Exam  General:  Cooperative, in no acute distress, appropriate for stated age.  Neuro:  Alert and oriented,  extra-ocular muscles intact  HEENT:  Normocephalic, atraumatic, neck supple  Skin:  no gross rash, warm, pink. Cardiac:  RRR, S1 S2 Respiratory: CTA B/L  Vascular:  Ext warm, no cyanosis apprec. Psych:  No HI/SI, judgement and insight good, Euthymic mood. Full Affect.   No results found for any visits on 10/23/22.  Assessment & Plan      Problem List Items Addressed This Visit       Cardiovascular and Mediastinum   Hypertension associated with diabetes ([place])     Endocrine   Diabetes mellitus type 2 in obese ([place])   Relevant Orders   CBC w/Diff   Comp Met ([facility])   Other Visit Diagnoses     Preoperative clearance    -  Primary   Relevant Orders   CBC w/Diff   Comp Met (CMET)   Chronic pain of right knee          Preoperative clearance, Chronic right knee pain: -Patient has been cleared by cardiology. -Patient is medically cleared for surgery, A1c today 7.4 which meets requirement for surgery. BP and pulse are within normal limits.  -Will collect CMP and CBC.  Diabetes mellitus type 2 in obese: -A1c today 7.4, has improved from 7.9. Continue with Glipizide 5 mg twice daily and Metformin XR 500 mg daily.   Hypertension associated with diabetes: -Controlled. Continue Lisinopril 5 mg daily.   Return in about 3 months (around 01/23/2023) for DM, HTN, HLD, tobacco use.        [name], PA-C  [facility] at [facility] [phone] (phone) [phone] (fax)  [place]

## 2022-10-24 LAB — COMPREHENSIVE METABOLIC PANEL
ALT: 17 IU/L (ref 0–44)
AST: 14 IU/L (ref 0–40)
Albumin/Globulin Ratio: 1.8 (ref 1.2–2.2)
Albumin: 4.5 g/dL (ref 3.9–4.9)
Alkaline Phosphatase: 109 IU/L (ref 44–121)
BUN/Creatinine Ratio: 27 — ABNORMAL HIGH (ref 10–24)
BUN: 23 mg/dL (ref 8–27)
Bilirubin Total: 0.5 mg/dL (ref 0.0–1.2)
CO2: 22 mmol/L (ref 20–29)
Calcium: 9.9 mg/dL (ref 8.6–10.2)
Chloride: 107 mmol/L — ABNORMAL HIGH (ref 96–106)
Creatinine, Ser: 0.84 mg/dL (ref 0.76–1.27)
Globulin, Total: 2.5 g/dL (ref 1.5–4.5)
Glucose: 158 mg/dL — ABNORMAL HIGH (ref 70–99)
Potassium: 4.5 mmol/L (ref 3.5–5.2)
Sodium: 142 mmol/L (ref 134–144)
Total Protein: 7 g/dL (ref 6.0–8.5)
eGFR: 94 mL/min/{1.73_m2} (ref >59)

## 2022-10-24 LAB — CBC WITH DIFFERENTIAL/PLATELET
Basophils Absolute: 0.1 10*3/uL (ref 0.0–0.2)
Basos: 1 %
EOS (ABSOLUTE): 0.2 10*3/uL (ref 0.0–0.4)
Eos: 2 %
Hematocrit: 44.4 % (ref 37.5–51.0)
Hemoglobin: 15.3 g/dL (ref 13.0–17.7)
Immature Grans (Abs): 0 10*3/uL (ref 0.0–0.1)
Immature Granulocytes: 0 %
Lymphocytes Absolute: 3.6 10*3/uL — ABNORMAL HIGH (ref 0.7–3.1)
Lymphs: 33 %
MCH: 30.2 pg (ref 26.6–33.0)
MCHC: 34.5 g/dL (ref 31.5–35.7)
MCV: 88 fL (ref 79–97)
Monocytes Absolute: 0.7 10*3/uL (ref 0.1–0.9)
Monocytes: 7 %
Neutrophils Absolute: 6.2 10*3/uL (ref 1.4–7.0)
Neutrophils: 57 %
Platelets: 214 10*3/uL (ref 150–450)
RBC: 5.07 x10E6/uL (ref 4.14–5.80)
RDW: 13.9 % (ref 11.6–15.4)
WBC: 10.8 10*3/uL (ref 3.4–10.8)

## 2022-10-25 ENCOUNTER — Other Ambulatory Visit: Payer: Self-pay | Admitting: Physician Assistant

## 2022-10-25 DIAGNOSIS — R350 Frequency of micturition: Secondary | ICD-10-CM

## 2022-10-29 DIAGNOSIS — M1711 Unilateral primary osteoarthritis, right knee: Secondary | ICD-10-CM | POA: Diagnosis not present

## 2022-10-29 DIAGNOSIS — Z96651 Presence of right artificial knee joint: Secondary | ICD-10-CM | POA: Diagnosis not present

## 2022-11-07 ENCOUNTER — Telehealth: Payer: Self-pay | Admitting: Cardiovascular Disease

## 2022-11-07 MED ORDER — APIXABAN 5 MG PO TABS: 5.0000 mg | ORAL_TABLET | Freq: Two times a day (BID) | ORAL | 0 refills | Status: DC

## 2022-11-07 NOTE — Telephone Encounter (Signed)
Patient has been receiving samples monthly and I see he applied for patient assistance in June. Any news on the status of that?  Also we are out of samples right now

## 2022-11-07 NOTE — Telephone Encounter (Signed)
Spoke to patient-advised samples not available.     He states he has paperwork that was sent back from the company requesting out of pocket rx expenses.  Advised to get this from pharmacy and send back to company.    Patient request 30 day supply sent to [facility] sent.

## 2022-11-07 NOTE — Progress Notes (Signed)
Sent message, via epic in basket, requesting orders in epic from surgeon.  

## 2022-11-07 NOTE — Telephone Encounter (Signed)
Patient calling the office for samples of medication:   1.  What medication and dosage are you requesting samples for?           apixaban (ELIQUIS) 5 MG TABS tablet Take 1 tablet (5 mg total) by mouth 2 (two) times daily.    2.  Are you currently out of this medication? Yes

## 2022-11-10 NOTE — Progress Notes (Signed)
Second request: Left a voicemail for [name] to request pre op orders in [facility].

## 2022-11-10 NOTE — Progress Notes (Addendum)
Anesthesia Review:  PCP: Cardiologist :  DR Croituri- [facility] 04/21/22  DR Modesto Charon- [place] 08/15/22  Pulmonology- DR Leory Plowman [facility]- [facility] 06/05/22  Chest x-ray : 08/11/22- super Ct of chest  EKG : 04/21/22  Echo : Stress test: Cardiac Cath :  Activity level:  Sleep Study/ CPAP : Fasting Blood Sugar :      / Checks Blood Sugar -- times a day:   Blood Thinner/ Instructions /Last Dose: ASA / Instructions/ Last Dose :   Eliquis  DM- type Hgba1c-

## 2022-11-13 NOTE — Patient Instructions (Signed)
SURGICAL WAITING ROOM VISITATION Patients having surgery or a procedure may have no more than 2 support people in the waiting area - these visitors may rotate.   Children under the age of 84 must have an adult with them who is not the patient. If the patient needs to stay at the hospital during part of their recovery, the visitor guidelines for inpatient rooms apply. Pre-op nurse will coordinate an appropriate time for 1 support person to accompany patient in pre-op.  This support person may not rotate.    Please refer to the [facility] website for the visitor guidelines for Inpatients (after your surgery is over and you are in a regular room).       Your procedure is scheduled on:  11/27/2022    Report to [facility] Main Entrance    Report to admitting at  [place] AM   Call this number if you have problems the morning of surgery [phone]   Do not eat food :After Midnight.   After Midnight you may have the following liquids until ___ 0500___ AM DAY OF SURGERY  Water Non-Citrus Juices (without pulp, NO RED) Carbonated Beverages Black Coffee (NO MILK/CREAM OR CREAMERS, sugar ok)  Clear Tea (NO MILK/CREAM OR CREAMERS, sugar ok) regular and decaf                             Plain Jell-O (NO RED)                                           Fruit ices (not with fruit pulp, NO RED)                                     Popsicles (NO RED)                                                               Sports drinks like Gatorade (NO RED)          If you have questions, please contact your surgeon's office.      Oral Hygiene is also important to reduce your risk of infection.                                    Remember - BRUSH YOUR TEETH THE MORNING OF SURGERY WITH YOUR REGULAR TOOTHPASTE   Do NOT smoke after Midnight   Take these medicines the morning of surgery with A SIP OF WATER:  gabapentin   DO NOT TAKE ANY ORAL DIABETIC MEDICATIONS DAY OF YOUR SURGERY  Bring CPAP mask  and tubing day of surgery.                              You may not have any metal on your body including hair pins, jewelry, and body piercing             Do not wear make-up, lotions, powders, perfumes/cologne,  or deodorant  Do not wear nail polish including gel and S&S, artificial/acrylic nails, or any other type of covering on natural nails including finger and toenails. If you have artificial nails, gel coating, etc. that needs to be removed by a nail salon please have this removed prior to surgery or surgery may need to be canceled/ delayed if the surgeon/ anesthesia feels like they are unable to be safely monitored.   Do not shave  48 hours prior to surgery.               Men may shave face and neck.   Do not bring valuables to the hospital. [place].   Contacts, dentures or bridgework may not be worn into surgery.   Bring small overnight bag day of surgery.   DO NOT [place]. PHARMACY WILL DISPENSE MEDICATIONS LISTED ON YOUR MEDICATION LIST TO YOU DURING YOUR ADMISSION [place]!    Patients discharged on the day of surgery will not be allowed to drive home.  Someone NEEDS to stay with you for the first 24 hours after anesthesia.   Special Instructions: Bring a copy of your healthcare power of attorney and living will documents the day of surgery if you haven't scanned them before.              Please read over the following fact sheets you were given: IF [place] [phone]   If you received a COVID test during your pre-op visit  it is requested that you wear a mask when out in public, stay away from anyone that may not be feeling well and notify your surgeon if you develop symptoms. If you test positive for Covid or have been in contact with anyone that has tested positive in the last 10 days please notify you surgeon.    [facility] - Preparing for Surgery Before surgery, you can play an important role.  Because skin is not sterile, your skin needs to be as free of germs as possible.  You can reduce the number of germs on your skin by washing with CHG (chlorahexidine gluconate) soap before surgery.  CHG is an antiseptic cleaner which kills germs and bonds with the skin to continue killing germs even after washing. Please DO NOT use if you have an allergy to CHG or antibacterial soaps.  If your skin becomes reddened/irritated stop using the CHG and inform your nurse when you arrive at Short Stay. Do not shave (including legs and underarms) for at least 48 hours prior to the first CHG shower.  You may shave your face/neck. Please follow these instructions carefully:  1.  Shower with CHG Soap the night before surgery and the  morning of Surgery.  2.  If you choose to wash your hair, wash your hair first as usual with your  normal  shampoo.  3.  After you shampoo, rinse your hair and body thoroughly to remove the  shampoo.                           4.  Use CHG as you would any other liquid soap.  You can apply chg directly  to the skin and wash  Gently with a scrungie or clean washcloth.  5.  Apply the CHG Soap to your body ONLY FROM THE NECK DOWN.   Do not use on face/ open                           Wound or open sores. Avoid contact with eyes, ears mouth and genitals (private parts).                       Wash face,  Genitals (private parts) with your normal soap.             6.  Wash thoroughly, paying special attention to the area where your surgery  will be performed.  7.  Thoroughly rinse your body with warm water from the neck down.  8.  DO NOT shower/wash with your normal soap after using and rinsing off  the CHG Soap.                9.  Pat yourself dry with a clean towel.            10.  Wear clean pajamas.            11.  Place clean sheets on your bed the night of your first shower and do not   sleep with pets. Day of Surgery : Do not apply any lotions/deodorants the morning of surgery.  Please wear clean clothes to the hospital/surgery center.  FAILURE TO FOLLOW THESE INSTRUCTIONS MAY RESULT IN THE CANCELLATION OF YOUR SURGERY PATIENT SIGNATURE_________________________________  NURSE SIGNATURE__________________________________  ________________________________________________________________________

## 2022-11-14 ENCOUNTER — Other Ambulatory Visit: Payer: Self-pay | Admitting: Orthopedic Surgery

## 2022-11-14 ENCOUNTER — Other Ambulatory Visit: Payer: Self-pay

## 2022-11-14 ENCOUNTER — Encounter (HOSPITAL_COMMUNITY): Payer: Self-pay

## 2022-11-14 ENCOUNTER — Encounter (HOSPITAL_COMMUNITY)
Admission: RE | Admit: 2022-11-14 | Discharge: 2022-11-14 | Disposition: A | Discharge status: Home / Self Care | Payer: PPO | Source: Ambulatory Visit | Attending: Orthopedic Surgery | Admitting: Orthopedic Surgery

## 2022-11-14 VITALS — BP 139/81 | HR 63 | Temp 98.2°F | Resp 16 | Ht 70.0 in

## 2022-11-14 DIAGNOSIS — Z01818 Encounter for other preprocedural examination: Secondary | ICD-10-CM

## 2022-11-14 DIAGNOSIS — G8929 Other chronic pain: Secondary | ICD-10-CM

## 2022-11-14 DIAGNOSIS — Z01812 Encounter for preprocedural laboratory examination: Secondary | ICD-10-CM | POA: Insufficient documentation

## 2022-11-14 HISTORY — DX: Personal history of urinary calculi: Z87.442

## 2022-11-14 LAB — CBC
HCT: 45.9 % (ref 39.0–52.0)
Hemoglobin: 14.8 g/dL (ref 13.0–17.0)
MCH: 29.5 pg (ref 26.0–34.0)
MCHC: 32.2 g/dL (ref 30.0–36.0)
MCV: 91.6 fL (ref 80.0–100.0)
Platelets: 201 10*3/uL (ref 150–400)
RBC: 5.01 MIL/uL (ref 4.22–5.81)
RDW: 14.3 % (ref 11.5–15.5)
WBC: 10.9 10*3/uL — ABNORMAL HIGH (ref 4.0–10.5)
nRBC: 0 % (ref 0.0–0.2)

## 2022-11-14 LAB — HEMOGLOBIN A1C
Hgb A1c MFr Bld: 7.1 % — ABNORMAL HIGH (ref 4.8–5.6)
Mean Plasma Glucose: 157.07 mg/dL

## 2022-11-14 LAB — BASIC METABOLIC PANEL
Anion gap: 6 (ref 5–15)
BUN: 21 mg/dL (ref 8–23)
CO2: 24 mmol/L (ref 22–32)
Calcium: 9.3 mg/dL (ref 8.9–10.3)
Chloride: 108 mmol/L (ref 98–111)
Creatinine, Ser: 0.91 mg/dL (ref 0.61–1.24)
GFR, Estimated: >60 mL/min (ref >60)
Glucose, Bld: 169 mg/dL — ABNORMAL HIGH (ref 70–99)
Potassium: 5 mmol/L (ref 3.5–5.1)
Sodium: 138 mmol/L (ref 135–145)

## 2022-11-14 LAB — SURGICAL PCR SCREEN
MRSA, PCR: POSITIVE — AB
Staphylococcus aureus: POSITIVE — AB

## 2022-11-14 LAB — GLUCOSE, CAPILLARY: Glucose-Capillary: 152 mg/dL — ABNORMAL HIGH (ref 70–99)

## 2022-11-20 NOTE — Progress Notes (Addendum)
Anesthesia Chart Review   Case: [id number] Date/Time: 11/27/22 0745   Procedure: TOTAL KNEE ARTHROPLASTY (Right: Knee)   Anesthesia type: Spinal   Pre-op diagnosis: Osteoarthritis of right knee M17.11   Location: [facility] ROOM 06 / [facility]   Surgeons: Vickey Huger, MD       DISCUSSION:71 y.o. smoker with h/o HTN, CAD (CABG), stroke, DM II, AAA, prostate cancer, right knee OA scheduled for above procedure 11/27/2022 with Dr. Vickey Huger.   Per cardiology preoperative evaluation 09/22/2022, "Chart reviewed as part of pre-operative protocol coverage. Per Dr. Sallyanne Kuster, primary cardiologist, and given past medical history and time since last visit, based on ACC/AHA guidelines, [name] would be at acceptable risk for the planned procedure without further cardiovascular testing.    Patient with diagnosis of PAF on Eliquis for anticoagulation.   Procedure: TKA Date of procedure: TBD  CHA2DS2-VASc Score = 6  This indicates a 9.7% annual risk of stroke. The patient's score is based upon: CHF History: 0 HTN History: 1 Diabetes History: 1 Stroke History: 2 Vascular Disease History: 1 Age Score: 1 Gender Score: 0 CrCl 135 mL/min Platelet count 221K   Patient will need to hold Eliquis for 3 days for TKA however has multiple comorbidities including stroke."  Pt reports last dose of Eliquis evening of 11/23/2022.   Anticipate pt can proceed with planned procedure barring acute status change.   VS: BP 139/81   Pulse 63   Temp 36.8 C (Oral)   Resp 16   Ht 5' 10" (1.778 m)   SpO2 97%   BMI 32.86 kg/m   PROVIDERS: [name], PA-C is PCP   Primary Cardiologist: Sanda Klein, MD   LABS: Labs reviewed: Acceptable for surgery. (all labs ordered are listed, but only abnormal results are displayed)  Labs Reviewed  SURGICAL PCR SCREEN - Abnormal; Notable for the following components:      Result Value   MRSA, PCR POSITIVE (*)    Staphylococcus aureus POSITIVE (*)    All other  components within normal limits  HEMOGLOBIN A1C - Abnormal; Notable for the following components:   Hgb A1c MFr Bld 7.1 (*)    All other components within normal limits  BASIC METABOLIC PANEL - Abnormal; Notable for the following components:   Glucose, Bld 169 (*)    All other components within normal limits  CBC - Abnormal; Notable for the following components:   WBC 10.9 (*)    All other components within normal limits  GLUCOSE, CAPILLARY - Abnormal; Notable for the following components:   Glucose-Capillary 152 (*)    All other components within normal limits     IMAGES:   EKG:   CV:  Past Medical History:  Diagnosis Date   Abdominal aortic aneurysm (AAA) (HCC)    3 cm per 07-02-18 US abdominal aorta US epic   Arthritis    Blood clot in vein    behind left knee   Chronic venous insufficiency LOWER EXTREMITIES   Coronary artery disease CARDIOLOGIST - DR  [id number]-  LAST 1 WK AGO -- WILL REQUEST NOTE AND STRESS TEST   Diabetes mellitus without complication (HCC)    type 2   Hematuria    last year   History of kidney stones    Hyperglycemia    Hyperlipidemia    Hypertension    Mixed dyslipidemia    Myocardial infarction (HCC)    Neuropathy    toes only   Nocturia    Prostate cancer (  [place]) 08/18/2011   gleason 8, volume 24.4cc   S/P CABG x 4    ST elevation MI (STEMI) ([place]) 02/10/2008   S/P CABG   Stroke ([place]) 2016   occ trouble wriing with right hand   Urinary hesitancy    Vision abnormalities    resolved now   Wound discharge    since jan 2019 goes to wound center [place] left great toe small open area changes dressing q 2 days with ointment provided by wound center, clear drainage occ    Past Surgical History:  Procedure Laterality Date   CARDIAC CATHETERIZATION  05/01/2012   grafts widely patent   CARDIOVASCULAR STRESS TEST  03-15-2010   INFERIOR WALL SCAR WITHOUT ANY MEANINGFUL ISCHEMIA/ EF 46% / LOW RISK SCAN   CATARACT EXTRACTION     CORONARY  ARTERY BYPASS GRAFT  02-10-2008  DR Einar Gip   X4 VESSEL DISEASE / EMERGANT CABG   CYSTOSCOPY  02/08/2012   Procedure: CYSTOSCOPY FLEXIBLE;  Surgeon: Franchot Gallo, MD;  Location: [facility];  Service: Urology;;   CYSTOSCOPY WITH RETROGRADE PYELOGRAM, URETEROSCOPY AND STENT PLACEMENT Right 09/12/2018   Procedure: CYSTOSCOPY WITH RETROGRADE PYELOGRAM, URETEROSCOPY AND STENT PLACEMENT, STONE EXTRACTION;  Surgeon: Franchot Gallo, MD;  [facility];  Service: Urology;  Laterality: Right;   EP IMPLANTABLE DEVICE N/A 08/14/2016   Procedure: Loop Recorder Insertion;  Surgeon: Evans Lance, MD;  Location: [place] CV LAB;  Service: Cardiovascular;  Laterality: N/A;   EXTRACORPOREAL SHOCK WAVE LITHOTRIPSY  2013   HOLMIUM LASER APPLICATION Right 02/15/9797   Procedure: HOLMIUM LASER APPLICATION;  Surgeon: Franchot Gallo, MD;  [facility];  Service: Urology;  Laterality: Right;   KNEE SURGERY  age 27   RIGHT   LEFT HEART CATHETERIZATION WITH CORONARY/GRAFT ANGIOGRAM N/A 05/01/2012   Procedure: LEFT HEART CATHETERIZATION WITH Beatrix Fetters;  Surgeon: Sanda Klein, MD;  Location: [facility] CATH LAB;  Service: Cardiovascular;  Laterality: N/A;   MULTIPLE TEETH EXTRACTIONS (23)/ FOUR QUADRANT ALVEOLPLASTY/ MANDIBULAR LATERAL EXOSTOSES REDUCTIONS  03-24-2010   CHRONIC PERIODONTITIS   RADIOACTIVE SEED IMPLANT  02/08/2012   Procedure: RADIOACTIVE SEED IMPLANT;  Surgeon: Franchot Gallo, MD;  Location: [facility];  Service: Urology;  Laterality: N/A;  C-ARM    SHOULDER SURGERY  1982   LEFT   TEE WITHOUT CARDIOVERSION N/A 08/14/2016   Procedure: TRANSESOPHAGEAL ECHOCARDIOGRAM (TEE);  Surgeon: Josue Hector, MD;  Location: [facility] ENDOSCOPY;  Service: Cardiovascular;  Laterality: N/A;   URETERAL STENT PLACEMENT      MEDICATIONS:  acetaminophen (TYLENOL) 500 MG tablet   apixaban (ELIQUIS) 5 MG TABS tablet   atorvastatin (LIPITOR) 10 MG tablet   blood  glucose meter kit and supplies KIT   Continuous Blood Gluc Receiver (FREESTYLE LIBRE 14 DAY READER) DEVI   Continuous Blood Gluc Sensor (FREESTYLE LIBRE 14 DAY SENSOR) MISC   diclofenac Sodium (VOLTAREN) 1 % GEL   gabapentin (NEURONTIN) 300 MG capsule   glipiZIDE (GLUCOTROL) 5 MG tablet   glucose blood (FREESTYLE LITE) test strip   lisinopril (ZESTRIL) 5 MG tablet   metFORMIN (GLUCOPHAGE-XR) 500 MG 24 hr tablet   Multiple Vitamin (MULTIVITAMIN WITH MINERALS) TABS tablet   tamsulosin (FLOMAX) 0.4 MG CAPS capsule   traMADol (ULTRAM) 50 MG tablet   No current facility-administered medications for this encounter.     Konrad Felix Ward, PA-C [facility] [phone]

## 2022-11-22 ENCOUNTER — Telehealth: Payer: Self-pay | Admitting: *Deleted

## 2022-11-22 DIAGNOSIS — E1169 Type 2 diabetes mellitus with other specified complication: Secondary | ICD-10-CM

## 2022-11-22 MED ORDER — FREESTYLE LIBRE 14 DAY SENSOR MISC: 0 refills | Status: DC

## 2022-11-22 NOTE — Telephone Encounter (Signed)
Pt called needing a refill on his Continuous Blood Gluc Sensor (FREESTYLE LIBRE 14 DAY SENSOR) MISC .  He said he is having surgery and he needs it to check sugar prior to surgery.  Also instructed him for future refills to contact pharmacy and the would contact us and we would route it to the provider here and she would refill it.  He has appointment at end of January with provider. [name], CMA

## 2022-11-22 NOTE — Telephone Encounter (Signed)
Sensor refills have been sent to [place] on [address].

## 2022-11-24 NOTE — Anesthesia Preprocedure Evaluation (Addendum)
Anesthesia Evaluation  Patient identified by MRN, date of birth, ID band Patient awake    Reviewed: Allergy & Precautions, NPO status , Patient's Chart, lab work & pertinent test results  History of Anesthesia Complications Negative for: history of anesthetic complications  Airway Mallampati: II  TM Distance: >3 FB Neck ROM: Full    Dental  (+) Edentulous Upper, Edentulous Lower   Pulmonary Current Smoker and Patient abstained from smoking.   Pulmonary exam normal        Cardiovascular hypertension, Pt. on medications + CAD, + Past MI, + CABG (2009) and + Peripheral Vascular Disease  Normal cardiovascular exam+ dysrhythmias Atrial Fibrillation   Echo 2017: EF 45-50%, inferior wall hypokinesis, mild MR    Neuro/Psych CVA (2016)    GI/Hepatic negative GI ROS, Neg liver ROS,,,  Endo/Other  diabetes, Type 2, Oral Hypoglycemic Agents    Renal/GU negative Renal ROS   Prostate cancer    Musculoskeletal  (+) Arthritis , Osteoarthritis,    Abdominal   Peds  Hematology negative hematology ROS (+)   Anesthesia Other Findings ELIQUIS, last dose 11/30 PM  Reproductive/Obstetrics                             Anesthesia Physical Anesthesia Plan  ASA: 3  Anesthesia Plan: Spinal   Post-op Pain Management: Tylenol PO (pre-op)* and Regional block*   Induction:   PONV Risk Score and Plan: 1 and Propofol infusion, Treatment may vary due to age or medical condition, Ondansetron and TIVA  Airway Management Planned: Nasal Cannula and Simple Face Mask  Additional Equipment: None  Intra-op Plan:   Post-operative Plan:   Informed Consent: I have reviewed the patients History and Physical, chart, labs and discussed the procedure including the risks, benefits and alternatives for the proposed anesthesia with the patient or authorized representative who has indicated his/her understanding and acceptance.        Plan Discussed with:   Anesthesia Plan Comments: (Plan for spinal assuming Eliquis held >72 hrs)       Anesthesia Quick Evaluation

## 2022-11-27 ENCOUNTER — Other Ambulatory Visit: Payer: Self-pay

## 2022-11-27 ENCOUNTER — Encounter (HOSPITAL_COMMUNITY): Payer: Self-pay | Admitting: Orthopedic Surgery

## 2022-11-27 ENCOUNTER — Ambulatory Visit (HOSPITAL_BASED_OUTPATIENT_CLINIC_OR_DEPARTMENT_OTHER): Payer: PPO | Admitting: Anesthesiology

## 2022-11-27 ENCOUNTER — Observation Stay (HOSPITAL_COMMUNITY)
Admission: RE | Admit: 2022-11-27 | Discharge: 2022-11-28 | Disposition: A | Discharge status: Home / Self Care | Payer: PPO | Attending: Orthopedic Surgery | Admitting: Orthopedic Surgery

## 2022-11-27 ENCOUNTER — Ambulatory Visit (HOSPITAL_COMMUNITY): Payer: PPO | Admitting: Physician Assistant

## 2022-11-27 ENCOUNTER — Encounter (HOSPITAL_COMMUNITY)
Admission: RE | Disposition: A | Discharge status: Home / Self Care | Payer: Self-pay | Source: Home / Self Care | Attending: Orthopedic Surgery

## 2022-11-27 DIAGNOSIS — G8918 Other acute postprocedural pain: Secondary | ICD-10-CM | POA: Diagnosis not present

## 2022-11-27 DIAGNOSIS — F17210 Nicotine dependence, cigarettes, uncomplicated: Secondary | ICD-10-CM

## 2022-11-27 DIAGNOSIS — I10 Essential (primary) hypertension: Secondary | ICD-10-CM

## 2022-11-27 DIAGNOSIS — Z951 Presence of aortocoronary bypass graft: Secondary | ICD-10-CM | POA: Insufficient documentation

## 2022-11-27 DIAGNOSIS — I4891 Unspecified atrial fibrillation: Secondary | ICD-10-CM | POA: Diagnosis not present

## 2022-11-27 DIAGNOSIS — G8929 Other chronic pain: Secondary | ICD-10-CM

## 2022-11-27 DIAGNOSIS — I2510 Atherosclerotic heart disease of native coronary artery without angina pectoris: Secondary | ICD-10-CM

## 2022-11-27 DIAGNOSIS — Z96659 Presence of unspecified artificial knee joint: Secondary | ICD-10-CM

## 2022-11-27 DIAGNOSIS — M1711 Unilateral primary osteoarthritis, right knee: Principal | ICD-10-CM | POA: Insufficient documentation

## 2022-11-27 DIAGNOSIS — I252 Old myocardial infarction: Secondary | ICD-10-CM

## 2022-11-27 DIAGNOSIS — Z7901 Long term (current) use of anticoagulants: Secondary | ICD-10-CM | POA: Insufficient documentation

## 2022-11-27 DIAGNOSIS — Z79899 Other long term (current) drug therapy: Secondary | ICD-10-CM | POA: Insufficient documentation

## 2022-11-27 DIAGNOSIS — E119 Type 2 diabetes mellitus without complications: Secondary | ICD-10-CM | POA: Insufficient documentation

## 2022-11-27 DIAGNOSIS — Z8546 Personal history of malignant neoplasm of prostate: Secondary | ICD-10-CM | POA: Insufficient documentation

## 2022-11-27 DIAGNOSIS — Z01818 Encounter for other preprocedural examination: Secondary | ICD-10-CM

## 2022-11-27 DIAGNOSIS — Z8673 Personal history of transient ischemic attack (TIA), and cerebral infarction without residual deficits: Secondary | ICD-10-CM | POA: Insufficient documentation

## 2022-11-27 DIAGNOSIS — I480 Paroxysmal atrial fibrillation: Secondary | ICD-10-CM | POA: Insufficient documentation

## 2022-11-27 DIAGNOSIS — Z7984 Long term (current) use of oral hypoglycemic drugs: Secondary | ICD-10-CM | POA: Insufficient documentation

## 2022-11-27 HISTORY — PX: TOTAL KNEE ARTHROPLASTY: SHX125

## 2022-11-27 LAB — CBC WITH DIFFERENTIAL/PLATELET
Abs Immature Granulocytes: 0.02 10*3/uL (ref 0.00–0.07)
Basophils Absolute: 0.1 10*3/uL (ref 0.0–0.1)
Basophils Relative: 1 %
Eosinophils Absolute: 0.2 10*3/uL (ref 0.0–0.5)
Eosinophils Relative: 2 %
HCT: 41.9 % (ref 39.0–52.0)
Hemoglobin: 13.4 g/dL (ref 13.0–17.0)
Immature Granulocytes: 0 %
Lymphocytes Relative: 25 %
Lymphs Abs: 2.4 10*3/uL (ref 0.7–4.0)
MCH: 29.2 pg (ref 26.0–34.0)
MCHC: 32 g/dL (ref 30.0–36.0)
MCV: 91.3 fL (ref 80.0–100.0)
Monocytes Absolute: 0.4 10*3/uL (ref 0.1–1.0)
Monocytes Relative: 4 %
Neutro Abs: 6.7 10*3/uL (ref 1.7–7.7)
Neutrophils Relative %: 68 %
Platelets: 168 10*3/uL (ref 150–400)
RBC: 4.59 MIL/uL (ref 4.22–5.81)
RDW: 13.8 % (ref 11.5–15.5)
WBC: 9.8 10*3/uL (ref 4.0–10.5)
nRBC: 0 % (ref 0.0–0.2)

## 2022-11-27 LAB — GLUCOSE, CAPILLARY
Glucose-Capillary: 207 mg/dL — ABNORMAL HIGH (ref 70–99)
Glucose-Capillary: 223 mg/dL — ABNORMAL HIGH (ref 70–99)
Glucose-Capillary: 240 mg/dL — ABNORMAL HIGH (ref 70–99)
Glucose-Capillary: 271 mg/dL — ABNORMAL HIGH (ref 70–99)

## 2022-11-27 LAB — COMPREHENSIVE METABOLIC PANEL
ALT: 23 U/L (ref 0–44)
AST: 14 U/L — ABNORMAL LOW (ref 15–41)
Albumin: 3.8 g/dL (ref 3.5–5.0)
Alkaline Phosphatase: 92 U/L (ref 38–126)
Anion gap: 8 (ref 5–15)
BUN: 17 mg/dL (ref 8–23)
CO2: 25 mmol/L (ref 22–32)
Calcium: 8.8 mg/dL — ABNORMAL LOW (ref 8.9–10.3)
Chloride: 104 mmol/L (ref 98–111)
Creatinine, Ser: 0.74 mg/dL (ref 0.61–1.24)
GFR, Estimated: >60 mL/min (ref >60)
Glucose, Bld: 234 mg/dL — ABNORMAL HIGH (ref 70–99)
Potassium: 5 mmol/L (ref 3.5–5.1)
Sodium: 137 mmol/L (ref 135–145)
Total Bilirubin: 0.5 mg/dL (ref 0.3–1.2)
Total Protein: 6.7 g/dL (ref 6.5–8.1)

## 2022-11-27 SURGERY — ARTHROPLASTY, KNEE, TOTAL
Anesthesia: General | Site: Knee | Laterality: Right

## 2022-11-27 MED ORDER — METHOCARBAMOL 500 MG IVPB - SIMPLE MED
INTRAVENOUS | Status: AC
  Administered 2022-11-27: 500 mg via INTRAVENOUS
  Filled 2022-11-27: qty 55

## 2022-11-27 MED ORDER — ONDANSETRON HCL 4 MG/2ML IJ SOLN: 4.0000 mg | Freq: Once | INTRAMUSCULAR | Status: DC | PRN

## 2022-11-27 MED ORDER — DEXAMETHASONE SODIUM PHOSPHATE 10 MG/ML IJ SOLN
10.0000 mg | Freq: Once | INTRAMUSCULAR | Status: AC
  Administered 2022-11-28: 10 mg via INTRAVENOUS
  Filled 2022-11-27: qty 1

## 2022-11-27 MED ORDER — GLIPIZIDE 5 MG PO TABS
5.0000 mg | ORAL_TABLET | Freq: Two times a day (BID) | ORAL | Status: DC
  Administered 2022-11-27 – 2022-11-28 (×2): 5 mg via ORAL
  Filled 2022-11-27 (×2): qty 1

## 2022-11-27 MED ORDER — CALCIUM CARBONATE ANTACID 500 MG PO CHEW
1.0000 | CHEWABLE_TABLET | Freq: Three times a day (TID) | ORAL | Status: DC | PRN
  Administered 2022-11-27: 200 mg via ORAL
  Filled 2022-11-27: qty 1

## 2022-11-27 MED ORDER — FENTANYL CITRATE (PF) 100 MCG/2ML IJ SOLN
INTRAMUSCULAR | Status: AC
  Filled 2022-11-27: qty 2

## 2022-11-27 MED ORDER — BUPIVACAINE-EPINEPHRINE (PF) 0.5% -1:200000 IJ SOLN
INTRAMUSCULAR | Status: AC
  Filled 2022-11-27: qty 30

## 2022-11-27 MED ORDER — DIPHENHYDRAMINE HCL 12.5 MG/5ML PO ELIX: 12.5000 mg | ORAL_SOLUTION | ORAL | Status: DC | PRN

## 2022-11-27 MED ORDER — FENTANYL CITRATE PF 50 MCG/ML IJ SOSY
PREFILLED_SYRINGE | INTRAMUSCULAR | Status: AC
  Administered 2022-11-27: 25 ug via INTRAVENOUS
  Filled 2022-11-27: qty 2

## 2022-11-27 MED ORDER — TRANEXAMIC ACID-NACL 1000-0.7 MG/100ML-% IV SOLN
1000.0000 mg | INTRAVENOUS | Status: AC
  Administered 2022-11-27: 1000 mg via INTRAVENOUS
  Filled 2022-11-27: qty 100

## 2022-11-27 MED ORDER — PHENYLEPHRINE HCL-NACL 20-0.9 MG/250ML-% IV SOLN
INTRAVENOUS | Status: AC
  Filled 2022-11-27: qty 500

## 2022-11-27 MED ORDER — BUPIVACAINE-EPINEPHRINE 0.5% -1:200000 IJ SOLN
INTRAMUSCULAR | Status: DC | PRN
  Administered 2022-11-27: 30 mL

## 2022-11-27 MED ORDER — ACETAMINOPHEN 325 MG PO TABS: 325.0000 mg | ORAL_TABLET | Freq: Four times a day (QID) | ORAL | Status: DC | PRN

## 2022-11-27 MED ORDER — GABAPENTIN 300 MG PO CAPS
300.0000 mg | ORAL_CAPSULE | Freq: Three times a day (TID) | ORAL | Status: DC
  Administered 2022-11-27 – 2022-11-28 (×3): 300 mg via ORAL
  Filled 2022-11-27 (×3): qty 1

## 2022-11-27 MED ORDER — CEFAZOLIN SODIUM-DEXTROSE 2-4 GM/100ML-% IV SOLN
2.0000 g | INTRAVENOUS | Status: AC
  Administered 2022-11-27: 2 g via INTRAVENOUS
  Filled 2022-11-27: qty 100

## 2022-11-27 MED ORDER — HYDROMORPHONE HCL 1 MG/ML IJ SOLN: 0.5000 mg | INTRAMUSCULAR | Status: DC | PRN

## 2022-11-27 MED ORDER — FENTANYL CITRATE (PF) 100 MCG/2ML IJ SOLN
INTRAMUSCULAR | Status: DC | PRN
  Administered 2022-11-27: 25 ug via INTRAVENOUS
  Administered 2022-11-27: 50 ug via INTRAVENOUS
  Administered 2022-11-27: 25 ug via INTRAVENOUS
  Administered 2022-11-27: 50 ug via INTRAVENOUS
  Administered 2022-11-27: 25 ug via INTRAVENOUS
  Administered 2022-11-27: 50 ug via INTRAVENOUS
  Administered 2022-11-27 (×3): 25 ug via INTRAVENOUS

## 2022-11-27 MED ORDER — VANCOMYCIN HCL 1500 MG/300ML IV SOLN
1500.0000 mg | INTRAVENOUS | Status: AC
  Administered 2022-11-27 (×2): 1500 mg via INTRAVENOUS
  Filled 2022-11-27: qty 300

## 2022-11-27 MED ORDER — SENNOSIDES-DOCUSATE SODIUM 8.6-50 MG PO TABS: 1.0000 | ORAL_TABLET | Freq: Every evening | ORAL | Status: DC | PRN

## 2022-11-27 MED ORDER — AMISULPRIDE (ANTIEMETIC) 5 MG/2ML IV SOLN: 10.0000 mg | Freq: Once | INTRAVENOUS | Status: DC | PRN

## 2022-11-27 MED ORDER — PROPOFOL 10 MG/ML IV BOLUS
INTRAVENOUS | Status: DC | PRN
  Administered 2022-11-27: 170 mg via INTRAVENOUS
  Administered 2022-11-27: 30 mg via INTRAVENOUS

## 2022-11-27 MED ORDER — METHOCARBAMOL 500 MG PO TABS
500.0000 mg | ORAL_TABLET | Freq: Four times a day (QID) | ORAL | Status: DC | PRN
  Administered 2022-11-27 – 2022-11-28 (×3): 500 mg via ORAL
  Filled 2022-11-27 (×3): qty 1

## 2022-11-27 MED ORDER — OXYCODONE HCL 5 MG/5ML PO SOLN: 5.0000 mg | Freq: Once | ORAL | Status: DC | PRN

## 2022-11-27 MED ORDER — MIDAZOLAM HCL 2 MG/2ML IJ SOLN
INTRAMUSCULAR | Status: AC
  Filled 2022-11-27: qty 2

## 2022-11-27 MED ORDER — OXYCODONE HCL 5 MG PO TABS
5.0000 mg | ORAL_TABLET | ORAL | Status: DC | PRN
  Administered 2022-11-27 – 2022-11-28 (×5): 10 mg via ORAL
  Filled 2022-11-27 (×5): qty 2

## 2022-11-27 MED ORDER — BUPIVACAINE LIPOSOME 1.3 % IJ SUSP
INTRAMUSCULAR | Status: AC
  Filled 2022-11-27: qty 20

## 2022-11-27 MED ORDER — LACTATED RINGERS IV SOLN: INTRAVENOUS | Status: DC

## 2022-11-27 MED ORDER — BUPIVACAINE LIPOSOME 1.3 % IJ SUSP: 20.0000 mL | Freq: Once | INTRAMUSCULAR | Status: DC

## 2022-11-27 MED ORDER — PANTOPRAZOLE SODIUM 40 MG PO TBEC
40.0000 mg | DELAYED_RELEASE_TABLET | Freq: Every day | ORAL | Status: DC
  Administered 2022-11-28: 40 mg via ORAL
  Filled 2022-11-27: qty 1

## 2022-11-27 MED ORDER — DOCUSATE SODIUM 100 MG PO CAPS
100.0000 mg | ORAL_CAPSULE | Freq: Two times a day (BID) | ORAL | Status: DC
  Administered 2022-11-27 – 2022-11-28 (×2): 100 mg via ORAL
  Filled 2022-11-27 (×2): qty 1

## 2022-11-27 MED ORDER — LISINOPRIL 5 MG PO TABS
5.0000 mg | ORAL_TABLET | Freq: Every day | ORAL | Status: DC
  Administered 2022-11-27 – 2022-11-28 (×2): 5 mg via ORAL
  Filled 2022-11-27 (×2): qty 1

## 2022-11-27 MED ORDER — SODIUM CHLORIDE (PF) 0.9 % IJ SOLN
INTRAMUSCULAR | Status: DC | PRN
  Administered 2022-11-27: 20 mL

## 2022-11-27 MED ORDER — PHENOL 1.4 % MT LIQD: 1.0000 | OROMUCOSAL | Status: DC | PRN

## 2022-11-27 MED ORDER — PROPOFOL 500 MG/50ML IV EMUL
INTRAVENOUS | Status: AC
  Filled 2022-11-27: qty 50

## 2022-11-27 MED ORDER — OXYCODONE HCL 5 MG PO TABS
ORAL_TABLET | ORAL | Status: AC
  Filled 2022-11-27: qty 1

## 2022-11-27 MED ORDER — MENTHOL 3 MG MT LOZG: 1.0000 | LOZENGE | OROMUCOSAL | Status: DC | PRN

## 2022-11-27 MED ORDER — PROPOFOL 500 MG/50ML IV EMUL
INTRAVENOUS | Status: DC | PRN
  Administered 2022-11-27: 150 ug/kg/min via INTRAVENOUS

## 2022-11-27 MED ORDER — ACETAMINOPHEN 500 MG PO TABS
1000.0000 mg | ORAL_TABLET | Freq: Once | ORAL | Status: AC
  Administered 2022-11-27: 1000 mg via ORAL
  Filled 2022-11-27: qty 2

## 2022-11-27 MED ORDER — OXYCODONE HCL 5 MG PO TABS: 5.0000 mg | ORAL_TABLET | Freq: Once | ORAL | Status: DC | PRN

## 2022-11-27 MED ORDER — FENTANYL CITRATE PF 50 MCG/ML IJ SOSY
25.0000 ug | PREFILLED_SYRINGE | INTRAMUSCULAR | Status: DC | PRN
  Administered 2022-11-27: 50 ug via INTRAVENOUS
  Administered 2022-11-27: 25 ug via INTRAVENOUS

## 2022-11-27 MED ORDER — METHOCARBAMOL 500 MG IVPB - SIMPLE MED: 500.0000 mg | Freq: Four times a day (QID) | INTRAVENOUS | Status: DC | PRN

## 2022-11-27 MED ORDER — PROPOFOL 1000 MG/100ML IV EMUL
INTRAVENOUS | Status: AC
  Filled 2022-11-27: qty 200

## 2022-11-27 MED ORDER — ORAL CARE MOUTH RINSE: 15.0000 mL | Freq: Once | OROMUCOSAL | Status: AC

## 2022-11-27 MED ORDER — DEXAMETHASONE SODIUM PHOSPHATE 10 MG/ML IJ SOLN
INTRAMUSCULAR | Status: DC | PRN
  Administered 2022-11-27: 8 mg via INTRAVENOUS

## 2022-11-27 MED ORDER — SODIUM CHLORIDE 0.9 % IV SOLN: INTRAVENOUS | Status: DC

## 2022-11-27 MED ORDER — ALUM & MAG HYDROXIDE-SIMETH 200-200-20 MG/5ML PO SUSP
30.0000 mL | ORAL | Status: DC | PRN
  Administered 2022-11-28: 30 mL via ORAL
  Filled 2022-11-27: qty 30

## 2022-11-27 MED ORDER — CHLORHEXIDINE GLUCONATE 0.12 % MT SOLN
15.0000 mL | Freq: Once | OROMUCOSAL | Status: AC
  Administered 2022-11-27: 15 mL via OROMUCOSAL

## 2022-11-27 MED ORDER — ACETAMINOPHEN 500 MG PO TABS
1000.0000 mg | ORAL_TABLET | Freq: Four times a day (QID) | ORAL | Status: AC
  Administered 2022-11-27 – 2022-11-28 (×4): 1000 mg via ORAL
  Filled 2022-11-27 (×4): qty 2

## 2022-11-27 MED ORDER — SODIUM CHLORIDE (PF) 0.9 % IJ SOLN
INTRAMUSCULAR | Status: AC
  Filled 2022-11-27: qty 20

## 2022-11-27 MED ORDER — BISACODYL 5 MG PO TBEC: 5.0000 mg | DELAYED_RELEASE_TABLET | Freq: Every day | ORAL | Status: DC | PRN

## 2022-11-27 MED ORDER — METOCLOPRAMIDE HCL 5 MG/ML IJ SOLN: 5.0000 mg | Freq: Three times a day (TID) | INTRAMUSCULAR | Status: DC | PRN

## 2022-11-27 MED ORDER — BUPIVACAINE LIPOSOME 1.3 % IJ SUSP
INTRAMUSCULAR | Status: DC | PRN
  Administered 2022-11-27: 20 mL

## 2022-11-27 MED ORDER — FERROUS SULFATE 325 (65 FE) MG PO TABS
325.0000 mg | ORAL_TABLET | Freq: Three times a day (TID) | ORAL | Status: DC
  Administered 2022-11-28: 325 mg via ORAL
  Filled 2022-11-27: qty 1

## 2022-11-27 MED ORDER — GABAPENTIN 300 MG PO CAPS
300.0000 mg | ORAL_CAPSULE | Freq: Once | ORAL | Status: AC
  Administered 2022-11-27: 300 mg via ORAL
  Filled 2022-11-27: qty 1

## 2022-11-27 MED ORDER — ZOLPIDEM TARTRATE 5 MG PO TABS: 5.0000 mg | ORAL_TABLET | Freq: Every evening | ORAL | Status: DC | PRN

## 2022-11-27 MED ORDER — MIDAZOLAM HCL 2 MG/2ML IJ SOLN
INTRAMUSCULAR | Status: DC | PRN
  Administered 2022-11-27: 2 mg via INTRAVENOUS

## 2022-11-27 MED ORDER — ONDANSETRON HCL 4 MG/2ML IJ SOLN
INTRAMUSCULAR | Status: DC | PRN
  Administered 2022-11-27: 4 mg via INTRAVENOUS

## 2022-11-27 MED ORDER — SODIUM CHLORIDE 0.9 % IR SOLN
Status: DC | PRN
  Administered 2022-11-27 (×2): 1000 mL

## 2022-11-27 MED ORDER — APIXABAN 5 MG PO TABS
5.0000 mg | ORAL_TABLET | Freq: Two times a day (BID) | ORAL | Status: DC
  Administered 2022-11-28: 5 mg via ORAL
  Filled 2022-11-27: qty 1

## 2022-11-27 MED ORDER — METOCLOPRAMIDE HCL 5 MG PO TABS: 5.0000 mg | ORAL_TABLET | Freq: Three times a day (TID) | ORAL | Status: DC | PRN

## 2022-11-27 MED ORDER — POVIDONE-IODINE 10 % EX SWAB: 2.0000 | Freq: Once | CUTANEOUS | Status: DC

## 2022-11-27 MED ORDER — ATORVASTATIN CALCIUM 10 MG PO TABS
10.0000 mg | ORAL_TABLET | Freq: Every day | ORAL | Status: DC
  Administered 2022-11-27 – 2022-11-28 (×2): 10 mg via ORAL
  Filled 2022-11-27 (×2): qty 1

## 2022-11-27 MED ORDER — TAMSULOSIN HCL 0.4 MG PO CAPS
0.4000 mg | ORAL_CAPSULE | Freq: Every day | ORAL | Status: DC
  Administered 2022-11-27: 0.4 mg via ORAL
  Filled 2022-11-27: qty 1

## 2022-11-27 MED ORDER — ROPIVACAINE HCL 5 MG/ML IJ SOLN
INTRAMUSCULAR | Status: DC | PRN
  Administered 2022-11-27: 30 mL via PERINEURAL

## 2022-11-27 MED ORDER — FLEET ENEMA 7-19 GM/118ML RE ENEM: 1.0000 | ENEMA | Freq: Once | RECTAL | Status: DC | PRN

## 2022-11-27 MED ORDER — DEXAMETHASONE SODIUM PHOSPHATE 10 MG/ML IJ SOLN: 8.0000 mg | Freq: Once | INTRAMUSCULAR | Status: DC

## 2022-11-27 MED ORDER — ONDANSETRON HCL 4 MG PO TABS: 4.0000 mg | ORAL_TABLET | Freq: Four times a day (QID) | ORAL | Status: DC | PRN

## 2022-11-27 MED ORDER — METFORMIN HCL ER 500 MG PO TB24: 500.0000 mg | ORAL_TABLET | Freq: Every day | ORAL | Status: DC | PRN

## 2022-11-27 MED ORDER — ONDANSETRON HCL 4 MG/2ML IJ SOLN: 4.0000 mg | Freq: Four times a day (QID) | INTRAMUSCULAR | Status: DC | PRN

## 2022-11-27 MED ORDER — PROPOFOL 10 MG/ML IV BOLUS
INTRAVENOUS | Status: AC
  Filled 2022-11-27: qty 20

## 2022-11-27 SURGICAL SUPPLY — 61 items
BAG COUNTER SPONGE SURGICOUNT (BAG) IMPLANT
BAG SPEC THK2 15X12 ZIP CLS (MISCELLANEOUS) ×1
BAG SPNG CNTER NS LX DISP (BAG)
BAG ZIPLOCK 12X15 (MISCELLANEOUS) ×1 IMPLANT
BLADE SAGITTAL 13X1.27X60 (BLADE) ×1 IMPLANT
BLADE SAW SGTL 18X1.27X75 (BLADE) ×1 IMPLANT
BLADE SURG 15 STRL LF DISP TIS (BLADE) ×1 IMPLANT
BLADE SURG 15 STRL SS (BLADE) ×1
BLADE SURG SZ10 CARB STEEL (BLADE) ×2 IMPLANT
BNDG ELASTIC 6X5.8 VLCR STR LF (GAUZE/BANDAGES/DRESSINGS) ×1 IMPLANT
BOWL SMART MIX CTS (DISPOSABLE) ×1 IMPLANT
BSPLAT TIB 5D F CMNT STM RT (Knees) ×1 IMPLANT
CEMENT BONE R 1X40 (Cement) ×2 IMPLANT
CLSR STERI-STRIP ANTIMIC 1/2X4 (GAUZE/BANDAGES/DRESSINGS) IMPLANT
COVER SURGICAL LIGHT HANDLE (MISCELLANEOUS) ×1 IMPLANT
CUFF TOURN SGL QUICK 34 (TOURNIQUET CUFF) ×1
CUFF TRNQT CYL 34X4.125X (TOURNIQUET CUFF) ×1 IMPLANT
DRAPE INCISE IOBAN 66X45 STRL (DRAPES) ×2 IMPLANT
DRAPE U-SHAPE 47X51 STRL (DRAPES) ×1 IMPLANT
DRESSING AQUACEL AG SP 3.5X10 (GAUZE/BANDAGES/DRESSINGS) IMPLANT
DRSG AQUACEL AG ADV 3.5X10 (GAUZE/BANDAGES/DRESSINGS) ×1 IMPLANT
DRSG AQUACEL AG SP 3.5X10 (GAUZE/BANDAGES/DRESSINGS) ×1
DURAPREP 26ML APPLICATOR (WOUND CARE) ×2 IMPLANT
ELECT REM PT RETURN 15FT ADLT (MISCELLANEOUS) ×1 IMPLANT
FEMUR  CMT CCR STD SZ10 R KNEE (Knees) ×1 IMPLANT
FEMUR CMT CCR STD SZ10 R KNEE (Knees) ×1 IMPLANT
FEMUR CMTD CCR STD SZ10 R KNEE (Knees) IMPLANT
GLOVE BIOGEL PI IND STRL 7.5 (GLOVE) ×1 IMPLANT
GLOVE BIOGEL PI IND STRL 8.5 (GLOVE) ×2 IMPLANT
GLOVE SURG LX STRL 7.5 STRW (GLOVE) ×1 IMPLANT
GLOVE SURG LX STRL 8.0 MICRO (GLOVE) ×2 IMPLANT
GLOVE SURG ORTHO 8.0 STRL STRW (GLOVE) ×2 IMPLANT
GOWN STRL REUS W/ TWL XL LVL3 (GOWN DISPOSABLE) ×2 IMPLANT
GOWN STRL REUS W/TWL XL LVL3 (GOWN DISPOSABLE) ×2
HANDPIECE INTERPULSE COAX TIP (DISPOSABLE) ×1
HOLDER FOLEY CATH W/STRAP (MISCELLANEOUS) ×1 IMPLANT
HOOD PEEL AWAY T7 (MISCELLANEOUS) ×3 IMPLANT
INSERT TIBIAL PLY R EF10-11X12 (Insert) IMPLANT
KIT TURNOVER KIT A (KITS) IMPLANT
MANIFOLD NEPTUNE II (INSTRUMENTS) ×1 IMPLANT
NDL HYPO 21X1.5 SAFETY (NEEDLE) ×1 IMPLANT
NEEDLE HYPO 21X1.5 SAFETY (NEEDLE) ×1 IMPLANT
NS IRRIG 1000ML POUR BTL (IV SOLUTION) ×1 IMPLANT
PACK TOTAL KNEE CUSTOM (KITS) ×1 IMPLANT
PROTECTOR NERVE ULNAR (MISCELLANEOUS) ×1 IMPLANT
SET HNDPC FAN SPRY TIP SCT (DISPOSABLE) ×1 IMPLANT
SPIKE FLUID TRANSFER (MISCELLANEOUS) ×2 IMPLANT
STEM POLY PAT PLY 38M KNEE (Knees) IMPLANT
STEM TIBIA 5 DEG SZ F R KNEE (Knees) IMPLANT
STRIP CLOSURE SKIN 1/2X4 (GAUZE/BANDAGES/DRESSINGS) ×1 IMPLANT
SUT BONE WAX W31G (SUTURE) ×1 IMPLANT
SUT MNCRL AB 3-0 PS2 18 (SUTURE) ×1 IMPLANT
SUT STRATAFIX 0 PDS 27 VIOLET (SUTURE) ×1
SUT STRATAFIX 1PDS 45CM VIOLET (SUTURE) ×1 IMPLANT
SUT VIC AB 1 CT1 36 (SUTURE) ×1 IMPLANT
SUTURE STRATFX 0 PDS 27 VIOLET (SUTURE) ×1 IMPLANT
SYR 30ML LL (SYRINGE) ×2 IMPLANT
TIBIA STEM 5 DEG SZ F R KNEE (Knees) ×1 IMPLANT
TRAY FOLEY MTR SLVR 16FR STAT (SET/KITS/TRAYS/PACK) ×1 IMPLANT
WATER STERILE IRR 1000ML POUR (IV SOLUTION) ×2 IMPLANT
WRAP KNEE MAXI GEL POST OP (GAUZE/BANDAGES/DRESSINGS) ×1 IMPLANT

## 2022-11-27 NOTE — Transfer of Care (Signed)
Immediate Anesthesia Transfer of Care Note  Patient: [name]  Procedure(s) Performed: TOTAL KNEE ARTHROPLASTY (Right: Knee)  Patient Location: PACU  Anesthesia Type:GA combined with regional for post-op pain  Level of Consciousness: drowsy and patient cooperative  Airway & Oxygen Therapy: Patient Spontanous Breathing and Patient connected to face mask oxygen  Post-op Assessment: Report given to RN and Post -op Vital signs reviewed and stable  Post vital signs: Reviewed and stable  Last Vitals:  Vitals Value Taken Time  BP 131/80 11/27/22 0912  Temp    Pulse 79 11/27/22 0914  Resp 11 11/27/22 0914  SpO2 98 % 11/27/22 0914  Vitals shown include unvalidated device data.  Last Pain:  Vitals:   11/27/22 0614  TempSrc: Oral         Complications: No notable events documented.

## 2022-11-27 NOTE — Anesthesia Procedure Notes (Signed)
Procedure Name: LMA Insertion Date/Time: 11/27/2022 7:49 AM  Performed by: Genelle Bal, CRNAPre-anesthesia Checklist: Patient identified, Emergency Drugs available, Suction available and Patient being monitored Patient Re-evaluated:Patient Re-evaluated prior to induction Oxygen Delivery Method: Circle system utilized Preoxygenation: Pre-oxygenation with 100% oxygen Induction Type: IV induction Ventilation: Mask ventilation without difficulty LMA: LMA inserted LMA Size: 5.0 Number of attempts: 1 Airway Equipment and Method: Bite block Placement Confirmation: positive ETCO2 Tube secured with: Tape Dental Injury: Teeth and Oropharynx as per pre-operative assessment

## 2022-11-27 NOTE — Progress Notes (Signed)
Orthopedic Tech Progress Note Patient Details:  [name] 09-14-1951 [id number]  CPM Right Knee CPM Right Knee: On Right Knee Flexion (Degrees): 70 Right Knee Extension (Degrees): 0  Post Interventions Patient Tolerated: Well Ortho Devices Type of Ortho Device: Bone foam zero knee Ortho Device/Splint Location: Right knee Ortho Device/Splint Interventions: Application   Post Interventions Patient Tolerated: Well  [name] [name] 11/27/2022, 9:51 AM

## 2022-11-27 NOTE — H&P (Signed)
[name] MRN:  [id number] DOB/SEX:  1951/06/29/male  CHIEF COMPLAINT:  Painful right Knee  HISTORY: Patient is a 71 y.o. male presented with a history of pain in the right knee. Onset of symptoms was gradual starting a few years ago with gradually worsening course since that time. Patient has been treated conservatively with over-the-counter NSAIDs and activity modification. Patient currently rates pain in the knee at 10 out of 10 with activity. There is pain at night.  PAST MEDICAL HISTORY: Patient Active Problem List   Diagnosis Date Noted   Lung nodule 06/19/2022   Microalbuminuria due to type 2 diabetes mellitus ([place]) 04/19/2020   Mixed diabetic hyperlipidemia associated with type 2 diabetes mellitus ([place]) 04/19/2020   Diabetic polyneuropathy associated with type 2 diabetes mellitus ([place]) 04/19/2020   Wound of left leg 09/11/2019   Tick bite of right lower leg 04/21/2019   Dyslipidemia (high LDL; low HDL) 01/28/2019   PAD (peripheral artery disease) ([place]) 01/28/2019   Great toe pain, left 10/21/2018   Encounter for Medicare annual wellness exam 06/20/2018   Laceration of left lower leg 04/17/2018   Laceration of skin of left lower leg 04/01/2018   Increased urinary frequency 04/01/2018   Hematuria 01/30/2018   Puncture wound of toe of left foot 01/07/2018   At risk for diabetic foot ulcer 01/07/2018   Need for pneumococcal vaccination 11/27/2017   Screening for colon cancer 11/27/2017   Healthcare maintenance 11/27/2017   Paroxysmal atrial fibrillation ([place]) 11/22/2016   History of embolic stroke 49/67/5916   Diabetes mellitus type 2 in obese ([place]) 11/22/2016   Venous insufficiency of both lower extremities 09/21/2016   Stroke ([place]) 08/12/2016   Hyperglycemia    Hypertension associated with diabetes ([place])    Vision, loss, sudden    CVA (cerebral infarction) 08/11/2016   CAD - inferior MI 2009, CABG 2009, patent grafts cath 04/2012 09/23/2013   Morbid obesity ([place])  09/23/2013   Hyperlipidemia 09/23/2013   Tobacco abuse 09/23/2013   Prostate cancer ([place]) 08/18/2011   Myocardial infarction [facility])    Past Medical History:  Diagnosis Date   Abdominal aortic aneurysm (AAA) ([place])    3 cm per 07-02-18 US abdominal aorta US epic   Arthritis    Blood clot in vein    behind left knee   Chronic venous insufficiency LOWER EXTREMITIES   Coronary artery disease CARDIOLOGIST - DR  [id number]  LAST 1 WK AGO -- WILL REQUEST NOTE AND STRESS TEST   Diabetes mellitus without complication ([place])    type 2   Hematuria    last year   History of kidney stones    Hyperglycemia    Hyperlipidemia    Hypertension    Mixed dyslipidemia    Myocardial infarction ([place])    Neuropathy    toes only   Nocturia    Prostate cancer ([place]) 08/18/2011   gleason 8, volume 24.4cc   S/P CABG x 4    ST elevation MI (STEMI) ([place]) 02/10/2008   S/P CABG   Stroke ([place]) 2016   occ trouble wriing with right hand   Urinary hesitancy    Vision abnormalities    resolved now   Wound discharge    since jan 2019 goes to wound center [place] left great toe small open area changes dressing q 2 days with ointment provided by wound center, clear drainage occ   Past Surgical History:  Procedure Laterality Date   CARDIAC CATHETERIZATION  05/01/2012   grafts widely patent  CARDIOVASCULAR STRESS TEST  03-15-2010   INFERIOR WALL SCAR WITHOUT ANY MEANINGFUL ISCHEMIA/ EF 46% / LOW RISK SCAN   CATARACT EXTRACTION     CORONARY ARTERY BYPASS GRAFT  02-10-2008  DR Einar Gip   X4 VESSEL DISEASE / [facility] CABG   CYSTOSCOPY  02/08/2012   Procedure: CYSTOSCOPY FLEXIBLE;  Surgeon: Franchot Gallo, MD;  Location: [facility];  Service: Urology;;   CYSTOSCOPY WITH RETROGRADE PYELOGRAM, URETEROSCOPY AND STENT PLACEMENT Right 09/12/2018   Procedure: CYSTOSCOPY WITH RETROGRADE PYELOGRAM, URETEROSCOPY AND STENT PLACEMENT, STONE EXTRACTION;  Surgeon: Franchot Gallo, MD;  Location: [facility];   Service: Urology;  Laterality: Right;   EP IMPLANTABLE DEVICE N/A 08/14/2016   Procedure: Loop Recorder Insertion;  Surgeon: Evans Lance, MD;  Location: [facility];  Service: Cardiovascular;  Laterality: N/A;   EXTRACORPOREAL SHOCK WAVE LITHOTRIPSY  2013   HOLMIUM LASER APPLICATION Right 3/47/4259   Procedure: HOLMIUM LASER APPLICATION;  Surgeon: Franchot Gallo, MD;  Location: [facility];  Service: Urology;  Laterality: Right;   KNEE SURGERY  age 25   RIGHT   LEFT HEART CATHETERIZATION WITH CORONARY/GRAFT ANGIOGRAM N/A 05/01/2012   Procedure: LEFT HEART CATHETERIZATION WITH Beatrix Fetters;  Surgeon: Sanda Klein, MD;  Location: [facility] CATH LAB;  Service: Cardiovascular;  Laterality: N/A;   MULTIPLE TEETH EXTRACTIONS (23)/ FOUR QUADRANT ALVEOLPLASTY/ MANDIBULAR LATERAL EXOSTOSES REDUCTIONS  03-24-2010   CHRONIC PERIODONTITIS   RADIOACTIVE SEED IMPLANT  02/08/2012   Procedure: RADIOACTIVE SEED IMPLANT;  Surgeon: Franchot Gallo, MD;  Location: [facility];  Service: Urology;  Laterality: N/A;  C-ARM    SHOULDER SURGERY  1982   LEFT   TEE WITHOUT CARDIOVERSION N/A 08/14/2016   Procedure: TRANSESOPHAGEAL ECHOCARDIOGRAM (TEE);  Surgeon: Josue Hector, MD;  Location: [facility] ENDOSCOPY;  Service: Cardiovascular;  Laterality: N/A;   URETERAL STENT PLACEMENT       MEDICATIONS:   Medications Prior to Admission  Medication Sig Dispense Refill Last Dose   acetaminophen (TYLENOL) 500 MG tablet Take 1,000 mg by mouth every 8 (eight) hours as needed for moderate pain.   11/26/2022   apixaban (ELIQUIS) 5 MG TABS tablet Take 1 tablet (5 mg total) by mouth 2 (two) times daily. 60 tablet 0 11/23/2022   atorvastatin (LIPITOR) 10 MG tablet Take 1 tablet (10 mg total) by mouth daily. 90 tablet 1 11/26/2022   blood glucose meter kit and supplies KIT Dispense based on patient and insurance preference. 1 each 0 11/26/2022   Continuous Blood Gluc Receiver (FREESTYLE LIBRE 14 DAY READER)  DEVI 1 Device by Does not apply route daily. Use to check blood sugars every morning and 2 hours after largest meals 1 each 1 11/26/2022   Continuous Blood Gluc Sensor (FREESTYLE LIBRE 14 DAY SENSOR) MISC USE AS DIRECTED 6 each 0 11/26/2022   gabapentin (NEURONTIN) 300 MG capsule TAKE 2 CAPSULES BY MOUTH IN THE MORNING AND 2 IN THE EVENING WITH  DINNER 360 capsule 0 11/26/2022   glipiZIDE (GLUCOTROL) 5 MG tablet Take 1 tablet (5 mg total) by mouth 2 (two) times daily before a meal. 180 tablet 0 11/26/2022   glucose blood (FREESTYLE LITE) test strip Use to check blood sugars every morning fasting 100 each 12 11/26/2022   lisinopril (ZESTRIL) 5 MG tablet Take 1 tablet by mouth once daily 90 tablet 0 11/26/2022   metFORMIN (GLUCOPHAGE-XR) 500 MG 24 hr tablet Take 1 tablet (500 mg total) by mouth daily with breakfast. (Patient taking differently: Take 500 mg by  mouth daily as needed (High Blood Sugar).) 90 tablet 0 11/26/2022   Multiple Vitamin (MULTIVITAMIN WITH MINERALS) TABS tablet Take 1 tablet by mouth daily. Men's One-A-Day Multivitamin   11/26/2022   tamsulosin (FLOMAX) 0.4 MG CAPS capsule Take 1 capsule by mouth once daily at bedtime 90 capsule 0 11/26/2022   traMADol (ULTRAM) 50 MG tablet Take 50 mg by mouth at bedtime.   11/26/2022   diclofenac Sodium (VOLTAREN) 1 % GEL Apply 2 g topically 4 (four) times daily as needed. (Patient not taking: Reported on 11/08/2022) 50 g 0 Not Taking    ALLERGIES:   Allergies  Allergen Reactions   Bee Venom Anaphylaxis   Penicillins Other (See Comments)    CAUSES "FREE BLEEDING" Has patient had a PCN reaction causing immediate rash, facial/tongue/throat swelling, SOB or lightheadedness with hypotension: No Has patient had a PCN reaction causing severe rash involving mucus membranes or skin necrosis: No Has patient had a PCN reaction that required hospitalization No Has patient had a PCN reaction occurring within the last 10 years: No If all of the above answers are  "NO", then may proceed with Cephalosporin use.    REVIEW OF SYSTEMS:  A comprehensive review of systems was negative except for: Musculoskeletal: positive for arthralgias and bone pain   FAMILY HISTORY:   Family History  Problem Relation Age of Onset   Cancer Father        pancreatic   Diabetes Father     SOCIAL HISTORY:   Social History   Tobacco Use   Smoking status: Every Day    Packs/day: 0.50    Years: 50.00    Total pack years: 25.00    Types: Cigarettes   Smokeless tobacco: Never   Tobacco comments:    PREVIOUSLY SMOKED 3PPD / DECREASED TO 1PPD SINCE 2009  Substance Use Topics   Alcohol use: Not Currently    Alcohol/week: 2.0 standard drinks of alcohol    Types: 1 Cans of beer, 1 Shots of liquor per week     EXAMINATION:  Vital signs in last 24 hours: Temp:  [98.2 F (36.8 C)] 98.2 F (36.8 C) (12/04 3419) Pulse Rate:  [68] 68 (12/04 0614) Resp:  [20] 20 (12/04 0614) BP: (136)/(81) 136/81 (12/04 0614) SpO2:  [96 %] 96 % (12/04 0614) Weight:  [101.2 kg] 101.2 kg (12/04 0557)  BP 136/81   Pulse 68   Temp 98.2 F (36.8 C) (Oral)   Resp 20   Ht _0  (1.778 m)   Wt 101.2 kg   SpO2 96%   BMI 32.00 kg/m   General Appearance:    Alert, cooperative, no distress, appears stated age  Head:    Normocephalic, without obvious abnormality, atraumatic  Eyes:    PERRL, conjunctiva/corneas clear, EOM's intact, fundi    benign, both eyes       Ears:    Normal TM's and external ear canals, both ears  Nose:   Nares normal, septum midline, mucosa normal, no drainage    or sinus tenderness  Throat:   Lips, mucosa, and tongue normal; teeth and gums normal  Neck:   Supple, symmetrical, trachea midline, no adenopathy;       thyroid:  No enlargement/tenderness/nodules; no carotid   bruit or JVD  Back:     Symmetric, no curvature, ROM normal, no CVA tenderness  Lungs:     Clear to auscultation bilaterally, respirations unlabored  Chest wall:    No tenderness or  deformity  Heart:  Regular rate and rhythm, S1 and S2 normal, no murmur, rub   or gallop  Abdomen:     Soft, non-tender, bowel sounds active all four quadrants,    no masses, no organomegaly  Genitalia:    Normal male without lesion, discharge or tenderness  Rectal:    Normal tone, normal prostate, no masses or tenderness;   guaiac negative stool  Extremities:   Extremities normal, atraumatic, no cyanosis or edema  Pulses:   2+ and symmetric all extremities  Skin:   Skin color, texture, turgor normal, no rashes or lesions  Lymph nodes:   Cervical, supraclavicular, and axillary nodes normal  Neurologic:   CNII-XII intact. Normal strength, sensation and reflexes      throughout    Musculoskeletal:  ROM 0-120, Ligaments intact,  Imaging Review Plain radiographs demonstrate severe degenerative joint disease of the right knee. The overall alignment is neutral. The bone quality appears to be good for age and reported activity level.  Assessment/Plan: Primary osteoarthritis, right knee   The patient history, physical examination and imaging studies are consistent with advanced degenerative joint disease of the right knee. The patient has failed conservative treatment.  The clearance notes were reviewed.  After discussion with the patient it was felt that Total Knee Replacement was indicated. The procedure,  risks, and benefits of total knee arthroplasty were presented and reviewed. The risks including but not limited to aseptic loosening, infection, blood clots, vascular injury, stiffness, patella tracking problems complications among others were discussed. The patient acknowledged the explanation, agreed to proceed with the plan.  Preoperative templating of the joint replacement has been completed, documented, and submitted to the Operating Room personnel in order to optimize intra-operative equipment management.    Patient's anticipated LOS is less than 2 midnights, meeting these  requirements: - Lives within 1 hour of care - Has a competent adult at home to recover with post-op recover - NO history of  - Chronic pain requiring opiods  - Diabetes  - Coronary Artery Disease  - Heart failure  - Heart attack  - Stroke  - DVT/VTE  - Cardiac arrhythmia  - Respiratory Failure/COPD  - Renal failure  - Anemia  - Advanced Liver disease     Donia Ast 11/27/2022, 7:02 AM

## 2022-11-27 NOTE — Inpatient Diabetes Management (Signed)
Inpatient Diabetes Program Recommendations  AACE/ADA: New Consensus Statement on Inpatient Glycemic Control (2015)  Target Ranges:  Prepandial:   less than 140 mg/dL      Peak postprandial:   less than 180 mg/dL (1-2 hours)      Critically ill patients:  140 - 180 mg/dL   Lab Results  Component Value Date   GLUCAP 223 (H) 11/27/2022   HGBA1C 7.1 (H) 11/14/2022    Review of Glycemic Control  Latest Reference Range & Units 11/27/22 06:10 11/27/22 09:16  Glucose-Capillary 70 - 99 mg/dL 207 (H) 223 (H)  (H): Data is abnormally high  Diabetes history: DM2 Outpatient Diabetes medications:  Glipizide 5 mg QD Metformin 500 mg PRN Freestyle Libre 14 day CGM Current orders for Inpatient glycemic control:  Glipizide 5 mg QD Metformin 500 mg PRN Decadron 8 mg this morning and ordered for tomorrow as well  Inpatient Diabetes Program Recommendations:    Please consider:  Novolog 0-9 units TID and 0-5 units QHS  Will continue to follow while inpatient.  Thank you, Reche Dixon, MSN, [place] Diabetes Coordinator Inpatient Diabetes Program [phone] (team pager from 8a-5p)

## 2022-11-27 NOTE — Anesthesia Procedure Notes (Signed)
Spinal  Patient location during procedure: OR Reason for block: surgical anesthesia Staffing Performed: anesthesiologist  Anesthesiologist: Lidia Collum, MD Performed by: Lidia Collum, MD Authorized by: Lidia Collum, MD   Preanesthetic Checklist Completed: patient identified, IV checked, risks and benefits discussed, surgical consent, monitors and equipment checked, pre-op evaluation and timeout performed Spinal Block Patient position: sitting Prep: DuraPrep and site prepped and draped Patient monitoring: continuous pulse ox, blood pressure and heart rate Approach: midline Location: L3-4 Injection technique: single-shot Needle Needle type: Pencan  Needle gauge: 24 G Needle length: 10 cm Assessment Events: failed spinal and second provider Additional Notes Multiple attempts by both CRNA and MD. Unsuccessful at multiple levels. Procedure aborted and converted to [place].

## 2022-11-27 NOTE — Anesthesia Postprocedure Evaluation (Signed)
Anesthesia Post Note  Patient: [name]  Procedure(s) Performed: TOTAL KNEE ARTHROPLASTY (Right: Knee)     Patient location during evaluation: PACU Anesthesia Type: General Level of consciousness: awake and alert Pain management: pain level controlled Vital Signs Assessment: post-procedure vital signs reviewed and stable Respiratory status: spontaneous breathing, nonlabored ventilation and respiratory function stable Cardiovascular status: blood pressure returned to baseline and stable Postop Assessment: no apparent nausea or vomiting Anesthetic complications: no   No notable events documented.  Last Vitals:  Vitals:   11/27/22 1030 11/27/22 1043  BP: (!) 128/90 126/86  Pulse: 64 65  Resp: 14 16  Temp:  36.5 C  SpO2: 96% 96%    Last Pain:  Vitals:   11/27/22 1043  TempSrc: Oral  PainSc:                  Lidia Collum

## 2022-11-27 NOTE — Anesthesia Procedure Notes (Signed)
Anesthesia Regional Block: Adductor canal block   Pre-Anesthetic Checklist: , timeout performed,  Correct Patient, Correct Site, Correct Laterality,  Correct Procedure, Correct Position, site marked,  Risks and benefits discussed,  Surgical consent,  Pre-op evaluation,  At surgeon's request and post-op pain management  Laterality: Right  Prep: chloraprep       Needles:  Injection technique: Single-shot  Needle Type: Echogenic Stimulator Needle     Needle Length: 10cm  Needle Gauge: 20     Additional Needles:   Procedures:,,,, ultrasound used (permanent image in chart),,    Narrative:  Start time: 11/27/2022 6:55 AM End time: 11/27/2022 6:58 AM Injection made incrementally with aspirations every 5 mL.  Performed by: Personally  Anesthesiologist: Lidia Collum, MD  Additional Notes: Standard monitors applied. Skin prepped. Good needle visualization with ultrasound. Injection made in 5cc increments with no resistance to injection. Patient tolerated the procedure well.

## 2022-11-27 NOTE — Op Note (Signed)
TOTAL KNEE REPLACEMENT OPERATIVE NOTE:  11/27/2022  1:08 PM  PATIENT:  [name]  71 y.o. male  PRE-OPERATIVE DIAGNOSIS:  Osteoarthritis of right knee M17.11  POST-OPERATIVE DIAGNOSIS:  Osteoarthritis of right knee M17.11  PROCEDURE:  Procedure(s): TOTAL KNEE ARTHROPLASTY  SURGEON:  Surgeon(s): Vickey Huger, MD  PHYSICIAN ASSISTANT: Carlyon Shadow, PA-C   ANESTHESIA:   spinal  SPECIMEN: None  COUNTS:  Correct  TOURNIQUET:   Total Tourniquet Time Documented: Thigh (Right) - 36 minutes Total: Thigh (Right) - 36 minutes   DICTATION:  Indication for procedure:    The patient is a 71 y.o. male who has failed conservative treatment for Osteoarthritis of right knee M17.11.  Informed consent was obtained prior to anesthesia. The risks versus benefits of the operation were explain and in a way the patient can, and did, understand.    Description of procedure:     The patient was taken to the operating room and placed under anesthesia.  The patient was positioned in the usual fashion taking care that all body parts were adequately padded and/or protected.  A tourniquet was applied and the leg prepped and draped in the usual sterile fashion.  The extremity was exsanguinated with the esmarch and tourniquet inflated to 300 mmHg.  Pre-operative range of motion was normal.    A midline incision approximately 6-7 inches long was made with a #10 blade.  A new blade was used to make a parapatellar arthrotomy going 2-3 cm into the quadriceps tendon, over the patella, and alongside the medial aspect of the patellar tendon.  A synovectomy was then performed with the #10 blade and forceps. I then elevated the deep MCL off the medial tibial metaphysis subperiosteally around to the semimembranosus attachment.    I everted the patella and used calipers to measure patellar thickness.  I used the reamer to ream down to appropriate thickness to recreate the native thickness.  I then removed  excess bone with the rongeur and sagittal saw.  I used the appropriately sized template and drilled the three lug holes.  I then put the trial in place and measured the thickness with the calipers to ensure recreation of the native thickness.  The trial was then removed and the patella subluxed and the knee brought into flexion.  A homan retractor was place to retract and protect the patella and lateral structures.  A Z-retractor was place medially to protect the medial structures.  The extra-medullary alignment system was used to make cut the tibial articular surface perpendicular to the anamotic axis of the tibia and in 3 degrees of posterior slope.  The cut surface and alignment jig was removed.  I then used the intramedullary alignment guide to make a valgus cut on the distal femur.  I then marked out the epicondylar axis on the distal femur.   I then used the anterior referencing sizer and measured the femur to be a size 10.  The 4-In-1 cutting block was screwed into place in external rotation matching the posterior condylar angle, making our cuts perpendicular to the epicondylar axis.  Anterior, posterior and chamfer cuts were made with the sagittal saw.  The cutting block and cut pieces were removed.  A lamina spreader was placed in 90 degrees of flexion.  The ACL, PCL, menisci, and posterior condylar osteophytes were removed.  A 12 mm spacer blocked was found to offer good flexion and extension gap balance after minimal in degree releasing.   The scoop retractor was then  placed and the femoral finishing block was pinned in place.  The small sagittal saw was used as well as the lug drill to finish the femur.  The block and cut surfaces were removed and the medullary canal hole filled with autograft bone from the cut pieces.  The tibia was delivered forward in deep flexion and external rotation.  A size F tray was selected and pinned into place centered on the medial 1/3 of the tibial tubercle.  The  reamer and keel was used to prepare the tibia through the tray.    I then trialed with the size 10 femur, size F tibia, a 12 mm insert and the 38 patella.  I had excellent flexion/extension gap balance, excellent patella tracking.  Flexion was full and beyond 120 degrees; extension was zero.  These components were chosen and the staff opened them to me on the back table while the knee was lavaged copiously and the cement mixed.  The soft tissue was infiltrated with 60cc of exparel 1.3% through a 21 gauge needle.  I cemented in the components and removed all excess cement.  The polyethylene tibial component was snapped into place and the knee placed in extension while cement was hardening.  The capsule was infilltrated with a 60cc exparel/marcaine/saline mixture.   Once the cement was hard, the tourniquet was let down.  Hemostasis was obtained.  The arthrotomy was closed using a #1 stratofix running suture.  The deep soft tissues were closed with #0 vicryls and the subcuticular layer closed with #2-0 vicryl.  The skin was reapproximated and closed with 3.0 Monocryl.  The wound was covered with steristrips, aquacel dressing, and a TED stocking.   The patient was then awakened, extubated, and taken to the recovery room in stable condition.  BLOOD LOSS:  323FT COMPLICATIONS:  None.  PLAN OF CARE: Admit for overnight observation  PATIENT DISPOSITION:  PACU - hemodynamically stable.   Please fax a copy of this op note to my office at [phone] (please only include page 1 and 2 of the Case Information op note)

## 2022-11-27 NOTE — Evaluation (Signed)
Physical Therapy Evaluation Patient Details Name: [name] MRN: [id number] DOB: 1951-10-24 Today's Date: 11/27/2022  History of Present Illness  71 YO male S/P Right TKA 11/27/22. PMH: MI, CABG, prostate cancer.  Clinical Impression  Pt admitted with above diagnosis.  Pt currently with functional limitations due to the deficits listed below (see PT Problem List). Pt will benefit from skilled PT to increase their independence and safety with mobility to allow discharge to the venue listed below.   The patient  received in bed, CPM running. Removed CPM at 1455. Patient noted with   right foot drop when  standing and  stepping with RW. Patient indicates decreased sensation in  lower leg from knee to toes. RN alerted of findings.  Limited  mobility to stepping to recliner using RW.     Recommendations for follow up therapy are one component of a multi-disciplinary discharge planning process, led by the attending physician.  Recommendations may be updated based on patient status, additional functional criteria and insurance authorization.  Follow Up Recommendations Follow physician's recommendations for discharge plan and follow up therapies      Assistance Recommended at Discharge Intermittent Supervision/Assistance  Patient can return home with the following  A little help with walking and/or transfers;Help with stairs or ramp for entrance;A little help with bathing/dressing/bathroom;Assistance with cooking/housework    Equipment Recommendations None recommended by PT  Recommendations for Other Services       Functional Status Assessment Patient has had a recent decline in their functional status and demonstrates the ability to make significant improvements in function in a reasonable and predictable amount of time.     Precautions / Restrictions Precautions Precautions: Fall;Knee Restrictions Weight Bearing Restrictions: No      Mobility  Bed Mobility Overal bed mobility:  Needs Assistance Bed Mobility: Supine to Sit     Supine to sit: HOB elevated, Min guard     General bed mobility comments: slight assist with right leg    Transfers Overall transfer level: Needs assistance Equipment used: Rolling walker (2 wheels) Transfers: Sit to/from Stand Sit to Stand: Min guard, From elevated surface           General transfer comment: cues for hand and right leg position    Ambulation/Gait Ambulation/Gait assistance: Min assist Gait Distance (Feet): 5 Feet Assistive device: Rolling walker (2 wheels) Gait Pattern/deviations: Step-to pattern, Steppage       General Gait Details: NOTED RIGHT FOOT DROP , ONLY  STEPPED TO RECLINER, ALERTED RN.  Stairs            Wheelchair Mobility    Modified Rankin (Stroke Patients Only)       Balance Overall balance assessment: Needs assistance Sitting-balance support: Feet supported, No upper extremity supported Sitting balance-Leahy Scale: Good     Standing balance support: Reliant on assistive device for balance, During functional activity, Bilateral upper extremity supported Standing balance-Leahy Scale: Poor                               Pertinent Vitals/Pain Pain Assessment Pain Assessment: 0-10 Pain Score: 8  Pain Location: right  knee Pain Descriptors / Indicators: Discomfort Pain Intervention(s): Repositioned, Premedicated before session, Monitored during session    Home Living Family/patient expects to be discharged to:: Private residence Living Arrangements: Spouse/significant other Available Help at Discharge: Family;Available 24 hours/day Type of Home: House Home Access: Ramped entrance       Home  Layout: One level Home Equipment: Conservation officer, nature (2 wheels);Cane - quad      Prior Function Prior Level of Function : Independent/Modified Independent             Mobility Comments: has been using Rw or quad cane ADLs Comments: IADL's     Hand Dominance    Dominant Hand: Right    Extremity/Trunk Assessment   Upper Extremity Assessment Upper Extremity Assessment: Overall WFL for tasks assessed    Lower Extremity Assessment Lower Extremity Assessment: RLE deficits/detail RLE Deficits / Details: right foot drop noted, pt. reports decreased sensation from knee down, Plantarflex 2+/5 RLE Sensation: decreased light touch;decreased proprioception RLE Coordination: decreased gross motor;decreased fine motor    Cervical / Trunk Assessment Cervical / Trunk Assessment: Normal  Communication      Cognition Arousal/Alertness: Awake/alert Behavior During Therapy: WFL for tasks assessed/performed Overall Cognitive Status: Within Functional Limits for tasks assessed                                          General Comments      Exercises Total Joint Exercises Ankle Circles/Pumps: AROM, AAROM, PROM, Right, Supine Quad Sets: AROM, Both, 10 reps, Supine   Assessment/Plan    PT Assessment Patient needs continued PT services  PT Problem List Decreased strength;Decreased range of motion;Impaired sensation;Decreased activity tolerance;Decreased knowledge of use of DME;Decreased safety awareness;Decreased mobility;Decreased knowledge of precautions       PT Treatment Interventions DME instruction;Therapeutic exercise;Gait training;Functional mobility training;Neuromuscular re-education;Therapeutic activities;Patient/family education    PT Goals (Current goals can be found in the Care Plan section)  Acute Rehab PT Goals Patient Stated Goal: go home PT Goal Formulation: With patient Time For Goal Achievement: 12/11/22 Potential to Achieve Goals: Good    Frequency 7X/week     Co-evaluation               AM-PAC PT "6 Clicks" Mobility  Outcome Measure Help needed turning from your back to your side while in a flat bed without using bedrails?: A Little Help needed moving from lying on your back to sitting on the  side of a flat bed without using bedrails?: A Little Help needed moving to and from a bed to a chair (including a wheelchair)?: A Little Help needed standing up from a chair using your arms (e.g., wheelchair or bedside chair)?: A Little Help needed to walk in hospital room?: Total Help needed climbing 3-5 steps with a railing? : Total 6 Click Score: 14    End of Session Equipment Utilized During Treatment: Gait belt Activity Tolerance: Patient tolerated treatment well;Treatment limited secondary to medical complications (Comment) (noted right foot drop) Patient left: in chair;with call bell/phone within reach;with chair alarm set Nurse Communication: Mobility status (right foot drop. Impaired sensation lower leg on right) PT Visit Diagnosis: Unsteadiness on feet (R26.81);Difficulty in walking, not elsewhere classified (R26.2);Other symptoms and signs involving the nervous system (R29.898);Pain Pain - Right/Left: Right Pain - part of body: Knee    Time: 2355-7322 PT Time Calculation (min) (ACUTE ONLY): 37 min   Charges:   PT Evaluation $PT Eval Low Complexity: 1 Low PT Treatments $Therapeutic Exercise: 8-22 mins $Therapeutic Activity: 8-22 mins        [place] Office [phone] Weekend [phone]   Claretha Cooper 11/27/2022, 4:56 PM

## 2022-11-28 ENCOUNTER — Encounter (HOSPITAL_COMMUNITY): Payer: Self-pay | Admitting: Orthopedic Surgery

## 2022-11-28 DIAGNOSIS — Z96651 Presence of right artificial knee joint: Secondary | ICD-10-CM | POA: Diagnosis not present

## 2022-11-28 DIAGNOSIS — M1711 Unilateral primary osteoarthritis, right knee: Secondary | ICD-10-CM | POA: Diagnosis not present

## 2022-11-28 MED ORDER — OXYCODONE HCL 5 MG PO TABS: 5.0000 mg | ORAL_TABLET | ORAL | 0 refills | Status: DC | PRN

## 2022-11-28 MED ORDER — METHOCARBAMOL 500 MG PO TABS: 500.0000 mg | ORAL_TABLET | Freq: Four times a day (QID) | ORAL | 0 refills | Status: DC | PRN

## 2022-11-28 NOTE — Plan of Care (Signed)
Pt ready to DC home with wife.

## 2022-11-28 NOTE — Plan of Care (Signed)
Plan of care reviewed and discussed with the patient. 

## 2022-11-28 NOTE — Progress Notes (Signed)
Physical Therapy Treatment Patient Details Name: [name] MRN: [id number] DOB: [birth year] Today's Date: 11/28/2022   History of Present Illness 71 YO male S/P Right TKA 11/27/22. PMH: MI, CABG, prostate cancer.    PT Comments    POD x1 pm session. Patient has met mobility goals and ready for discharge. Patient found in recliner eager to go home with wife present. Pt c/o of 4/10 pain at rest no increase in pain throughout session. Required min guard for bed mobility for safety. Pt utilized modified safety belt to guide RLE on bed. Pt was able to scoot himself up the bed. Supervision needed for transfers for safety from recliner and bed. Cues for hand and right leg position. Required min guard/supervision to ambulate 100 ft with RW. Noted slight right foot drop. Reviewed HEP with pt/wife and addressed any questions. Educated patient on use of ICE at home. Patient plans on discharging to wife's house with wife support.  Recommendations for follow up therapy are one component of a multi-disciplinary discharge planning process, led by the attending physician.  Recommendations may be updated based on patient status, additional functional criteria and insurance authorization.  Follow Up Recommendations  Follow physician's recommendations for discharge plan and follow up therapies     Assistance Recommended at Discharge Intermittent Supervision/Assistance  Patient can return home with the following A little help with walking and/or transfers;Help with stairs or ramp for entrance;A little help with bathing/dressing/bathroom;Assistance with cooking/housework   Equipment Recommendations  None recommended by PT    Recommendations for Other Services       Precautions / Restrictions Precautions Precautions: None Precaution Comments: right foot drop Restrictions Weight Bearing Restrictions: No     Mobility  Bed Mobility Overal bed mobility: Needs Assistance Bed Mobility: Sit to Supine        Sit to supine: Min guard   General bed mobility comments: patient found in recliner prior to session. Min guard needed for safety. Pt utilized modified safety belt to guide RLE off of bed. Pt was able to scoot himself up the bed.    Transfers Overall transfer level: Needs assistance Equipment used: Rolling walker (2 wheels) Transfers: Sit to/from Stand Sit to Stand: Supervision           General transfer comment: Supervision needed for safety. cues for hand and right leg position    Ambulation/Gait Ambulation/Gait assistance: Min guard, Supervision Gait Distance (Feet): 100 Feet Assistive device: Rolling walker (2 wheels) Gait Pattern/deviations: Step-through pattern       General Gait Details: Noted slight right foot drop   Stairs             Wheelchair Mobility    Modified Rankin (Stroke Patients Only)       Balance                                            Cognition Arousal/Alertness: Awake/alert Behavior During Therapy: WFL for tasks assessed/performed Overall Cognitive Status: Within Functional Limits for tasks assessed                                          Exercises Total Joint Exercises Ankle Circles/Pumps: AAROM, PROM, Both, 10 reps Quad Sets: 5 reps, Right, AROM Heel Slides: AAROM, Right, 5 reps Hip  ABduction/ADduction: AROM, Right, 5 reps Straight Leg Raises: AROM, 5 reps, Right    General Comments        Pertinent Vitals/Pain Pain Assessment Pain Assessment: 0-10 Pain Score: 4  Pain Location: right  knee Pain Descriptors / Indicators: Discomfort Pain Intervention(s): Limited activity within patient's tolerance, Monitored during session, Ice applied    Home Living                          Prior Function            PT Goals (current goals can now be found in the care plan section) Acute Rehab PT Goals Patient Stated Goal: go home PT Goal Formulation: With  patient Time For Goal Achievement: 12/11/22 Potential to Achieve Goals: Good Progress towards PT goals: Progressing toward goals    Frequency    7X/week      PT Plan Current plan remains appropriate    Co-evaluation              AM-PAC PT "6 Clicks" Mobility   Outcome Measure  Help needed turning from your back to your side while in a flat bed without using bedrails?: A Little Help needed moving from lying on your back to sitting on the side of a flat bed without using bedrails?: A Little Help needed moving to and from a bed to a chair (including a wheelchair)?: A Little Help needed standing up from a chair using your arms (e.g., wheelchair or bedside chair)?: A Little Help needed to walk in hospital room?: A Little Help needed climbing 3-5 steps with a railing? : A Lot 6 Click Score: 17    End of Session Equipment Utilized During Treatment: Gait belt Activity Tolerance: Patient tolerated treatment well Patient left: with call bell/phone within reach;with family/visitor present;in bed;with bed alarm set Nurse Communication: Mobility status PT Visit Diagnosis: Unsteadiness on feet (R26.81);Difficulty in walking, not elsewhere classified (R26.2);Other symptoms and signs involving the nervous system (R29.898);Pain Pain - Right/Left: Right Pain - part of body: Knee     Time: 1431-1454 PT Time Calculation (min) (ACUTE ONLY): 23 min  Charges:  $Gait Training: 8-22 mins $Therapeutic Exercise: 8-22 mins                       Copeland Neisen 11/28/2022, 3:52 PM

## 2022-11-28 NOTE — TOC Transition Note (Signed)
Transition of Care [facility]) - CM/SW Discharge Note  Patient Details  Name: [name] MRN: [id number] Date of Birth: 1951/12/22  [facility] CM/SW Contact:  Sherie Don, LCSW Phone Number: 11/28/2022, 9:48 AM  Clinical Narrative: Patient is expected to discharge home after working with PT. CSW met with patient to review discharge plan. Patient reported he will go home with OPPT at Dr. Ruel Favors office. Patient has a rolling walker, so there are no DME needs at this time. TOC signing off.  Final next level of care: OP Rehab Barriers to Discharge: No Barriers Identified  Patient Goals and CMS Choice Patient states their goals for this hospitalization and ongoing recovery are:: Discharge home with OPPT at Dr. Ruel Favors office Choice offered to / list presented to : NA  Discharge Plan and Services       DME Arranged: N/A DME Agency: NA  Social Determinants of Health (SDOH) Interventions    Readmission Risk Interventions     No data to display

## 2022-11-28 NOTE — Progress Notes (Signed)
SPORTS MEDICINE AND JOINT REPLACEMENT  Lara Mulch, MD    Carlyon Shadow, PA-C [address], [place], [place]  [address]                             [phone]   PROGRESS NOTE  Subjective:  negative for Chest Pain  negative for Shortness of Breath  negative for Nausea/Vomiting   negative for Calf Pain  negative for Bowel Movement   Tolerating Diet: yes         Patient reports pain as 3 on 0-10 scale.    Objective: Vital signs in last 24 hours:   Patient Vitals for the past 24 hrs:  BP Temp Temp src Pulse Resp SpO2  11/28/22 0954 (!) 104/56 (!) 97.4 F (36.3 C) Oral (!) 59 17 95 %  11/28/22 0551 107/69 98.2 F (36.8 C) Oral 66 18 95 %  11/28/22 0135 124/67 97.9 F (36.6 C) Oral 63 16 96 %  11/27/22 2206 116/70 (!) 97.5 F (36.4 C) Oral 65 17 96 %  11/27/22 1854 123/69 97.7 F (36.5 C) -- 71 18 100 %    '@flow'$ {1959:LAST@   Intake/Output from previous day:   12/04 0701 - 12/05 0700 In: 2810 [P.O.:720; I.V.:1840] Out: 2050 [Urine:2000]   Intake/Output this shift:   12/05 0701 - 12/05 1900 In: 240 [P.O.:240] Out: 625 [Urine:625]   Intake/Output      12/04 0701 12/05 0700 12/05 0701 12/06 0700   P.O. 720 240   I.V. (mL/kg) 1840 (18.2)    IV Piggyback 250    Total Intake(mL/kg) 2810 (27.8) 240 (2.4)   Urine (mL/kg/hr) 2000 (0.8) 625 (1.1)   Blood 50    Total Output 2050 625   Net +760 -385           LABORATORY DATA: Recent Labs    11/27/22 0953  WBC 9.8  HGB 13.4  HCT 41.9  PLT 168   Recent Labs    11/27/22 0953  NA 137  K 5.0  CL 104  CO2 25  BUN 17  CREATININE 0.74  GLUCOSE 234*  CALCIUM 8.8*   Lab Results  Component Value Date   INR 0.92 09/12/2018   INR 1.02 08/15/2016   INR 0.96 02/01/2012    Examination:  General appearance: alert, cooperative, and no distress Extremities: extremities normal, atraumatic, no cyanosis or edema  Wound Exam: clean, dry, intact   Drainage:  None: wound tissue dry  Motor Exam:  Quadriceps and Hamstrings Intact  Sensory Exam: Superficial Peroneal, Deep Peroneal, and Tibial normal   Assessment:    1 Day Post-Op  Procedure(s) (LRB): TOTAL KNEE ARTHROPLASTY (Right)  ADDITIONAL DIAGNOSIS:  Principal Problem:   S/P total knee replacement     Plan: Physical Therapy as ordered Weight Bearing as Tolerated (WBAT)  DVT Prophylaxis:   Eliquis  DISCHARGE PLAN: Home  Patient doing well and ready for D/C home       Patient's anticipated LOS is less than 2 midnights, meeting these requirements: - Lives within 1 hour of care - Has a competent adult at home to recover with post-op recover - NO history of  - Chronic pain requiring opiods  - Diabetes  - Coronary Artery Disease  - Heart failure  - Heart attack  - Stroke  - DVT/VTE  - Cardiac arrhythmia  - Respiratory Failure/COPD  - Renal failure  - Anemia  - Advanced Liver disease  Donia Ast 11/28/2022, 12:52 PM

## 2022-11-28 NOTE — Progress Notes (Addendum)
Physical Therapy Treatment Patient Details Name: [name] MRN: [id number] DOB: 03-19-1951 Today's Date: 11/28/2022   History of Present Illness 71 YO male S/P Right TKA 11/27/22. PMH: MI, CABG, prostate cancer.    PT Comments    The patient  reports pain is controlled, has had medciation.  Right dorsiflexion remains weak but noted  improved  since last PM  visit, Mild foot drop on right noted during ambulation. None on the left.  Patient reports H/O neuropathy  but right lower leg  is less intact than left per patient. Continue PT after MD visits. Most likely  to Dc home .  Recommendations for follow up therapy are one component of a multi-disciplinary discharge planning process, led by the attending physician.  Recommendations may be updated based on patient status, additional functional criteria and insurance authorization.  Follow Up Recommendations   Per MD     Assistance Recommended at Discharge Intermittent Supervision/Assistance  Patient can return home with the following A little help with walking and/or transfers;Help with stairs or ramp for entrance;A little help with bathing/dressing/bathroom;Assistance with cooking/housework   Equipment Recommendations    none   Recommendations for Other Services       Precautions / Restrictions Precautions Precautions: None Precaution Comments: right foot drop     Mobility  Bed Mobility Overal bed mobility: Needs Assistance Bed Mobility: Supine to Sit     Supine to sit: Supervision, HOB elevated          Transfers Overall transfer level: Needs assistance Equipment used: Rolling walker (2 wheels)   Sit to Stand: Supervision           General transfer comment: cues for hand and right leg position    Ambulation/Gait Ambulation/Gait assistance: Min guard, Supervision Gait Distance (Feet): 100 Feet Assistive device: Rolling walker (2 wheels) Gait Pattern/deviations: Step-through pattern       General  Gait Details: Noted right foot drop but not as pronounced as previous visit.   Stairs             Wheelchair Mobility    Modified Rankin (Stroke Patients Only)       Balance   Sitting-balance support: Feet supported, No upper extremity supported Sitting balance-Leahy Scale: Good     Standing balance support: Reliant on assistive device for balance, During functional activity, Bilateral upper extremity supported Standing balance-Leahy Scale: Fair                              Cognition Arousal/Alertness: Awake/alert                                              Exercises Total Joint Exercises Ankle Circles/Pumps: AROM, AAROM, PROM, Right, Supine Quad Sets: AROM, Both, 10 reps, Supine Heel Slides: AAROM, Right, 10 reps Hip ABduction/ADduction: AROM, Right, 10 reps Long Arc Quad: AROM, Right, 10 reps Goniometric ROM: 10-60 right knee flex    General Comments        Pertinent Vitals/Pain Pain Assessment Pain Score: 6  Pain Location: right  knee Pain Descriptors / Indicators: Discomfort Pain Intervention(s): Limited activity within patient's tolerance, Monitored during session, Premedicated before session, Repositioned, Ice applied    Home Living  Prior Function            PT Goals (current goals can now be found in the care plan section) Progress towards PT goals: Progressing toward goals    Frequency    7X/week      PT Plan Current plan remains appropriate    Co-evaluation              AM-PAC PT "6 Clicks" Mobility   Outcome Measure  Help needed turning from your back to your side while in a flat bed without using bedrails?: A Little Help needed moving from lying on your back to sitting on the side of a flat bed without using bedrails?: A Little Help needed moving to and from a bed to a chair (including a wheelchair)?: A Little Help needed standing up from a chair using  your arms (e.g., wheelchair or bedside chair)?: A Little Help needed to walk in hospital room?: A Little Help needed climbing 3-5 steps with a railing? : A Lot 6 Click Score: 17    End of Session Equipment Utilized During Treatment: Gait belt Activity Tolerance: Patient tolerated treatment well;Treatment limited secondary to medical complications (Comment) Patient left: in chair;with call bell/phone within reach;with family/visitor present Nurse Communication: Mobility status (right foot drop present) PT Visit Diagnosis: Unsteadiness on feet (R26.81);Difficulty in walking, not elsewhere classified (R26.2);Other symptoms and signs involving the nervous system (R29.898);Pain Pain - Right/Left: Right Pain - part of body: Knee     Time: 0917-1005 PT Time Calculation (min) (ACUTE ONLY): 48 min  Charges:  $Gait Training: 8-22 mins $Therapeutic Exercise: 8-22 mins $Self Care/Home Management: [place] Office [phone] Weekend [phone]   Claretha Cooper 11/28/2022, 10:56 AM

## 2022-11-28 NOTE — Discharge Summary (Signed)
SPORTS MEDICINE & JOINT REPLACEMENT   Lara Mulch, MD   Carlyon Shadow, PA-C [place], [place], [place]  [id number]                             [phone]  PATIENT ID: [name]        MRN:  [id number]          DOB/AGE: [birth year] / 71 y.o.    DISCHARGE SUMMARY  ADMISSION DATE:    11/27/2022 DISCHARGE DATE:   11/28/2022   ADMISSION DIAGNOSIS: S/P total knee replacement [Z96.659]    DISCHARGE DIAGNOSIS:  Osteoarthritis of right knee M17.11    ADDITIONAL DIAGNOSIS: Principal Problem:   S/P total knee replacement  Past Medical History:  Diagnosis Date   Abdominal aortic aneurysm (AAA) (HCC)    3 cm per 07-02-18 US abdominal aorta US epic   Arthritis    Blood clot in vein    behind left knee   Chronic venous insufficiency LOWER EXTREMITIES   Coronary artery disease CARDIOLOGIST - DR  GURKYHCW-  LAST 1 WK AGO -- WILL REQUEST NOTE AND STRESS TEST   Diabetes mellitus without complication ([facility])    type 2   Hematuria    last year   History of kidney stones    Hyperglycemia    Hyperlipidemia    Hypertension    Mixed dyslipidemia    Myocardial infarction ([facility])    Neuropathy    toes only   Nocturia    Prostate cancer ([facility]) 08/18/2011   gleason 8, volume 24.4cc   S/P CABG x 4    ST elevation MI (STEMI) ([facility]) 02/10/2008   S/P CABG   Stroke ([facility]) 2016   occ trouble wriing with right hand   Urinary hesitancy    Vision abnormalities    resolved now   Wound discharge    since jan 2019 goes to wound center [facility] left great toe small open area changes dressing q 2 days with ointment provided by wound center, clear drainage occ    PROCEDURE: Procedure(s): TOTAL KNEE ARTHROPLASTY on 11/27/2022  CONSULTS:    HISTORY:  See H&P in chart  HOSPITAL COURSE:  [name] is a 71 y.o. admitted on 11/27/2022 and found to have a diagnosis of Osteoarthritis of right knee M17.11.  After appropriate laboratory studies were obtained  they were taken to the  operating room on 11/27/2022 and underwent Procedure(s): TOTAL KNEE ARTHROPLASTY.   They were given perioperative antibiotics:  Anti-infectives (From admission, onward)    Start     Dose/Rate Route Frequency Ordered Stop   11/27/22 0600  vancomycin (VANCOREADY) IVPB 1500 mg/300 mL        1,500 mg 150 mL/hr over 120 Minutes Intravenous On call to O.R. 11/27/22 0531 11/27/22 0842   11/27/22 0600  ceFAZolin (ANCEF) IVPB 2g/100 mL premix        2 g 200 mL/hr over 30 Minutes Intravenous On call to O.R. 11/27/22 2376 11/27/22 0826     .  Patient given tranexamic acid IV or topical and exparel intra-operatively.  Tolerated the procedure well.    POD# 1: Vital signs were stable.  Patient denied Chest pain, shortness of breath, or calf pain.  Patient was started on Aspirin twice daily at 8am.  Consults to PT, OT, and care management were made.  The patient was weight bearing as tolerated.  CPM was placed on the operative leg  0-90 degrees for 6-8 hours a day. When out of the CPM, patient was placed in the foam block to achieve full extension. Incentive spirometry was taught.  Dressing was changed.       POD #2, Continued  PT for ambulation and exercise program.  IV saline locked.  O2 discontinued.    The remainder of the hospital course was dedicated to ambulation and strengthening.   The patient was discharged on 1 Day Post-Op in  Good condition.  Blood products given:none  DIAGNOSTIC STUDIES: Recent vital signs: Patient Vitals for the past 24 hrs:  BP Temp Temp src Pulse Resp SpO2  11/28/22 0954 (!) 104/56 (!) 97.4 F (36.3 C) Oral (!) 59 17 95 %  11/28/22 0551 107/69 98.2 F (36.8 C) Oral 66 18 95 %  11/28/22 0135 124/67 97.9 F (36.6 C) Oral 63 16 96 %  11/27/22 2206 116/70 (!) 97.5 F (36.4 C) Oral 65 17 96 %  11/27/22 1854 123/69 97.7 F (36.5 C) -- 71 18 100 %       Recent laboratory studies: Recent Labs    11/27/22 0953  WBC 9.8  HGB 13.4  HCT 41.9  PLT 168    Recent Labs    11/27/22 0953  NA 137  K 5.0  CL 104  CO2 25  BUN 17  CREATININE 0.74  GLUCOSE 234*  CALCIUM 8.8*   Lab Results  Component Value Date   INR 0.92 09/12/2018   INR 1.02 08/15/2016   INR 0.96 02/01/2012     Recent Radiographic Studies :  No results found.  DISCHARGE INSTRUCTIONS: Discharge Instructions     Call MD / Call 911   Complete by: As directed    If you experience chest pain or shortness of breath, CALL 911 and be transported to the hospital emergency room.  If you develope a fever above 101 F, pus (white drainage) or increased drainage or redness at the wound, or calf pain, call your surgeon's office.   Constipation Prevention   Complete by: As directed    Drink plenty of fluids.  Prune juice may be helpful.  You may use a stool softener, such as Colace (over the counter) 100 mg twice a day.  Use MiraLax (over the counter) for constipation as needed.   Diet - low sodium heart healthy   Complete by: As directed    Discharge instructions   Complete by: As directed    INSTRUCTIONS AFTER JOINT REPLACEMENT   Remove items at home which could result in a fall. This includes throw rugs or furniture in walking pathways ICE to the affected joint every three hours while awake for 30 minutes at a time, for at least the first 3-5 days, and then as needed for pain and swelling.  Continue to use ice for pain and swelling. You may notice swelling that will progress down to the foot and ankle.  This is normal after surgery.  Elevate your leg when you are not up walking on it.   Continue to use the breathing machine you got in the hospital (incentive spirometer) which will help keep your temperature down.  It is common for your temperature to cycle up and down following surgery, especially at night when you are not up moving around and exerting yourself.  The breathing machine keeps your lungs expanded and your temperature down.   DIET:  As you were doing prior to  hospitalization, we recommend a well-balanced diet.  DRESSING / WOUND  CARE / SHOWERING  Keep the surgical dressing until follow up.  The dressing is water proof, so you can shower without any extra covering.  IF THE DRESSING FALLS OFF or the wound gets wet inside, change the dressing with sterile gauze.  Please use good hand washing techniques before changing the dressing.  Do not use any lotions or creams on the incision until instructed by your surgeon.    ACTIVITY  Increase activity slowly as tolerated, but follow the weight bearing instructions below.   No driving for 6 weeks or until further direction given by your physician.  You cannot drive while taking narcotics.  No lifting or carrying greater than 10 lbs. until further directed by your surgeon. Avoid periods of inactivity such as sitting longer than an hour when not asleep. This helps prevent blood clots.  You may return to work once you are authorized by your doctor.     WEIGHT BEARING   Weight bearing as tolerated with assist device (walker, cane, etc) as directed, use it as long as suggested by your surgeon or therapist, typically at least 4-6 weeks.   EXERCISES  Results after joint replacement surgery are often greatly improved when you follow the exercise, range of motion and muscle strengthening exercises prescribed by your doctor. Safety measures are also important to protect the joint from further injury. Any time any of these exercises cause you to have increased pain or swelling, decrease what you are doing until you are comfortable again and then slowly increase them. If you have problems or questions, call your caregiver or physical therapist for advice.   Rehabilitation is important following a joint replacement. After just a few days of immobilization, the muscles of the leg can become weakened and shrink (atrophy).  These exercises are designed to build up the tone and strength of the thigh and leg muscles and to  improve motion. Often times heat used for twenty to thirty minutes before working out will loosen up your tissues and help with improving the range of motion but do not use heat for the first two weeks following surgery (sometimes heat can increase post-operative swelling).   These exercises can be done on a training (exercise) mat, on the floor, on a table or on a bed. Use whatever works the best and is most comfortable for you.    Use music or television while you are exercising so that the exercises are a pleasant break in your day. This will make your life better with the exercises acting as a break in your routine that you can look forward to.   Perform all exercises about fifteen times, three times per day or as directed.  You should exercise both the operative leg and the other leg as well.  Exercises include:   Quad Sets - Tighten up the muscle on the front of the thigh (Quad) and hold for 5-10 seconds.   Straight Leg Raises - With your knee straight (if you were given a brace, keep it on), lift the leg to 60 degrees, hold for 3 seconds, and slowly lower the leg.  Perform this exercise against resistance later as your leg gets stronger.  Leg Slides: Lying on your back, slowly slide your foot toward your buttocks, bending your knee up off the floor (only go as far as is comfortable). Then slowly slide your foot back down until your leg is flat on the floor again.  Angel Wings: Lying on your back spread your legs to  the side as far apart as you can without causing discomfort.  Hamstring Strength:  Lying on your back, push your heel against the floor with your leg straight by tightening up the muscles of your buttocks.  Repeat, but this time bend your knee to a comfortable angle, and push your heel against the floor.  You may put a pillow under the heel to make it more comfortable if necessary.   A rehabilitation program following joint replacement surgery can speed recovery and prevent re-injury in  the future due to weakened muscles. Contact your doctor or a physical therapist for more information on knee rehabilitation.    CONSTIPATION  Constipation is defined medically as fewer than three stools per week and severe constipation as less than one stool per week.  Even if you have a regular bowel pattern at home, your normal regimen is likely to be disrupted due to multiple reasons following surgery.  Combination of anesthesia, postoperative narcotics, change in appetite and fluid intake all can affect your bowels.   YOU MUST use at least one of the following options; they are listed in order of increasing strength to get the job done.  They are all available over the counter, and you may need to use some, POSSIBLY even all of these options:    Drink plenty of fluids (prune juice may be helpful) and high fiber foods Colace 100 mg by mouth twice a day  Senokot for constipation as directed and as needed Dulcolax (bisacodyl), take with full glass of water  Miralax (polyethylene glycol) once or twice a day as needed.  If you have tried all these things and are unable to have a bowel movement in the first 3-4 days after surgery call either your surgeon or your primary doctor.    If you experience loose stools or diarrhea, hold the medications until you stool forms back up.  If your symptoms do not get better within 1 week or if they get worse, check with your doctor.  If you experience "the worst abdominal pain ever" or develop nausea or vomiting, please contact the office immediately for further recommendations for treatment.   ITCHING:  If you experience itching with your medications, try taking only a single pain pill, or even half a pain pill at a time.  You can also use Benadryl over the counter for itching or also to help with sleep.   TED HOSE STOCKINGS:  Use stockings on both legs until for at least 2 weeks or as directed by physician office. They may be removed at night for  sleeping.  MEDICATIONS:  See your medication summary on the "After Visit Summary" that nursing will review with you.  You may have some home medications which will be placed on hold until you complete the course of blood thinner medication.  It is important for you to complete the blood thinner medication as prescribed.  PRECAUTIONS:  If you experience chest pain or shortness of breath - call 911 immediately for transfer to the hospital emergency department.   If you develop a fever greater that 101 F, purulent drainage from wound, increased redness or drainage from wound, foul odor from the wound/dressing, or calf pain - CONTACT YOUR SURGEON.  FOLLOW-UP APPOINTMENTS:  If you do not already have a post-op appointment, please call the office for an appointment to be seen by your surgeon.  Guidelines for how soon to be seen are listed in your "After Visit Summary", but are typically between 1-4 weeks after surgery.  OTHER INSTRUCTIONS:   Knee Replacement:  Do not place pillow under knee, focus on keeping the knee straight while resting. CPM instructions: 0-90 degrees, 2 hours in the morning, 2 hours in the afternoon, and 2 hours in the evening. Place foam block, curve side up under heel at all times except when in CPM or when walking.  DO NOT modify, tear, cut, or change the foam block in any way.  POST-OPERATIVE OPIOID TAPER INSTRUCTIONS: It is important to wean off of your opioid medication as soon as possible. If you do not need pain medication after your surgery it is ok to stop day one. Opioids include: Codeine, Hydrocodone(Norco, Vicodin), Oxycodone(Percocet, oxycontin) and hydromorphone amongst others.  Long term and even short term use of opiods can cause: Increased pain response Dependence Constipation Depression Respiratory depression And more.  Withdrawal symptoms can include Flu like symptoms Nausea, vomiting And more Techniques  to manage these symptoms Hydrate well Eat regular healthy meals Stay active Use relaxation techniques(deep breathing, meditating, yoga) Do Not substitute Alcohol to help with tapering If you have been on opioids for less than two weeks and do not have pain than it is ok to stop all together.  Plan to wean off of opioids This plan should start within one week post op of your joint replacement. Maintain the same interval or time between taking each dose and first decrease the dose.  Cut the total daily intake of opioids by one tablet each day Next start to increase the time between doses. The last dose that should be eliminated is the evening dose.     MAKE SURE YOU:  Understand these instructions.  Get help right away if you are not doing well or get worse.    Thank you for letting us be a part of your medical care team.  It is a privilege we respect greatly.  We hope these instructions will help you stay on track for a fast and full recovery!   Increase activity slowly as tolerated   Complete by: As directed    Post-operative opioid taper instructions:   Complete by: As directed    POST-OPERATIVE OPIOID TAPER INSTRUCTIONS: It is important to wean off of your opioid medication as soon as possible. If you do not need pain medication after your surgery it is ok to stop day one. Opioids include: Codeine, Hydrocodone(Norco, Vicodin), Oxycodone(Percocet, oxycontin) and hydromorphone amongst others.  Long term and even short term use of opiods can cause: Increased pain response Dependence Constipation Depression Respiratory depression And more.  Withdrawal symptoms can include Flu like symptoms Nausea, vomiting And more Techniques to manage these symptoms Hydrate well Eat regular healthy meals Stay active Use relaxation techniques(deep breathing, meditating, yoga) Do Not substitute Alcohol to help with tapering If you have been on opioids for less than two weeks and do not  have pain than it is ok to stop all together.  Plan to wean off of opioids This plan should start within one week post op of your joint replacement. Maintain the same interval or time between taking each dose and first decrease the dose.  Cut the total daily intake of opioids by one tablet each day Next start to  increase the time between doses. The last dose that should be eliminated is the evening dose.          DISCHARGE MEDICATIONS:   Allergies as of 11/28/2022       Reactions   Bee Venom Anaphylaxis   Penicillins Other (See Comments)   CAUSES "FREE BLEEDING" Has patient had a PCN reaction causing immediate rash, facial/tongue/throat swelling, SOB or lightheadedness with hypotension: No Has patient had a PCN reaction causing severe rash involving mucus membranes or skin necrosis: No Has patient had a PCN reaction that required hospitalization No Has patient had a PCN reaction occurring within the last 10 years: No If all of the above answers are "NO", then may proceed with Cephalosporin use.        Medication List     TAKE these medications    acetaminophen 500 MG tablet Commonly known as: TYLENOL Take 1,000 mg by mouth every 8 (eight) hours as needed for moderate pain.   apixaban 5 MG Tabs tablet Commonly known as: Eliquis Take 1 tablet (5 mg total) by mouth 2 (two) times daily.   atorvastatin 10 MG tablet Commonly known as: LIPITOR Take 1 tablet (10 mg total) by mouth daily.   blood glucose meter kit and supplies Kit Dispense based on patient and insurance preference.   diclofenac Sodium 1 % Gel Commonly known as: Voltaren Apply 2 g topically 4 (four) times daily as needed.   FreeStyle Libre 14 Day Reader Kerrin Mo 1 Device by Does not apply route daily. Use to check blood sugars every morning and 2 hours after largest meals   FreeStyle Libre 14 Day Sensor Misc USE AS DIRECTED   gabapentin 300 MG capsule Commonly known as: NEURONTIN TAKE 2 CAPSULES BY MOUTH  IN THE MORNING AND 2 IN THE EVENING WITH  DINNER   glipiZIDE 5 MG tablet Commonly known as: GLUCOTROL Take 1 tablet (5 mg total) by mouth 2 (two) times daily before a meal.   glucose blood test strip Commonly known as: FREESTYLE LITE Use to check blood sugars every morning fasting   lisinopril 5 MG tablet Commonly known as: ZESTRIL Take 1 tablet by mouth once daily   metFORMIN 500 MG 24 hr tablet Commonly known as: GLUCOPHAGE-XR Take 1 tablet (500 mg total) by mouth daily with breakfast. What changed:  when to take this reasons to take this   methocarbamol 500 MG tablet Commonly known as: ROBAXIN Take 1-2 tablets (500-1,000 mg total) by mouth every 6 (six) hours as needed for muscle spasms.   multivitamin with minerals Tabs tablet Take 1 tablet by mouth daily. Men's One-A-Day Multivitamin   oxyCODONE 5 MG immediate release tablet Commonly known as: Oxy IR/ROXICODONE Take 1 tablet (5 mg total) by mouth every 4 (four) hours as needed for moderate pain (pain score 4-6).   tamsulosin 0.4 MG Caps capsule Commonly known as: FLOMAX Take 1 capsule by mouth once daily at bedtime   traMADol 50 MG tablet Commonly known as: ULTRAM Take 50 mg by mouth at bedtime.               Durable Medical Equipment  (From admission, onward)           Start     Ordered   11/27/22 1100  DME Walker rolling  Once       Question:  Patient needs a walker to treat with the following condition  Answer:  S/P total knee replacement   11/27/22 1059   11/27/22  1100  DME 3 n 1  Once        11/27/22 1059   11/27/22 1100  DME Bedside commode  Once       Question:  Patient needs a bedside commode to treat with the following condition  Answer:  S/P total knee replacement   11/27/22 1059            FOLLOW UP VISIT:    DISPOSITION: HOME VS. SNF  Dental Antibiotics:  In most cases prophylactic antibiotics for Dental procdeures after total joint surgery are not necessary.  Exceptions  are as follows:  1. History of prior total joint infection  2. Severely immunocompromised (Organ Transplant, cancer chemotherapy, Rheumatoid biologic meds such as [place])  3. Poorly controlled diabetes (A1C &gt; 8.0, blood glucose over 200)  If you have one of these conditions, contact your surgeon for an antibiotic prescription, prior to your dental procedure.   CONDITION:  Good   Donia Ast 11/28/2022, 12:55 PM

## 2022-12-07 ENCOUNTER — Other Ambulatory Visit (HOSPITAL_COMMUNITY): Payer: Self-pay

## 2022-12-07 DIAGNOSIS — M25561 Pain in right knee: Secondary | ICD-10-CM | POA: Diagnosis not present

## 2022-12-07 DIAGNOSIS — M25661 Stiffness of right knee, not elsewhere classified: Secondary | ICD-10-CM | POA: Diagnosis not present

## 2022-12-07 DIAGNOSIS — R29898 Other symptoms and signs involving the musculoskeletal system: Secondary | ICD-10-CM | POA: Diagnosis not present

## 2022-12-07 DIAGNOSIS — M25461 Effusion, right knee: Secondary | ICD-10-CM | POA: Diagnosis not present

## 2022-12-07 DIAGNOSIS — Z96651 Presence of right artificial knee joint: Secondary | ICD-10-CM | POA: Diagnosis not present

## 2022-12-07 DIAGNOSIS — Z7409 Other reduced mobility: Secondary | ICD-10-CM | POA: Diagnosis not present

## 2022-12-07 MED ORDER — OXYCODONE HCL 5 MG PO TABS
5.0000 mg | ORAL_TABLET | Freq: Four times a day (QID) | ORAL | 0 refills | Status: DC | PRN
  Filled 2022-12-07: qty 30, 4d supply, fill #0

## 2022-12-08 ENCOUNTER — Other Ambulatory Visit (HOSPITAL_COMMUNITY): Payer: Self-pay

## 2022-12-11 ENCOUNTER — Telehealth: Payer: Self-pay | Admitting: Cardiovascular Disease

## 2022-12-11 NOTE — Telephone Encounter (Signed)
Patient calling the office for samples of medication:   1.  What medication and dosage are you requesting samples for? apixaban (ELIQUIS) 5 MG TABS tablet  2.  Are you currently out of this medication? No    

## 2022-12-12 ENCOUNTER — Other Ambulatory Visit (HOSPITAL_COMMUNITY): Payer: Self-pay

## 2022-12-12 DIAGNOSIS — G8929 Other chronic pain: Secondary | ICD-10-CM | POA: Diagnosis not present

## 2022-12-12 DIAGNOSIS — M25561 Pain in right knee: Secondary | ICD-10-CM | POA: Diagnosis not present

## 2022-12-12 DIAGNOSIS — R29898 Other symptoms and signs involving the musculoskeletal system: Secondary | ICD-10-CM | POA: Diagnosis not present

## 2022-12-12 DIAGNOSIS — Z96651 Presence of right artificial knee joint: Secondary | ICD-10-CM | POA: Diagnosis not present

## 2022-12-12 DIAGNOSIS — Z7409 Other reduced mobility: Secondary | ICD-10-CM | POA: Diagnosis not present

## 2022-12-12 DIAGNOSIS — M25661 Stiffness of right knee, not elsewhere classified: Secondary | ICD-10-CM | POA: Diagnosis not present

## 2022-12-12 DIAGNOSIS — M25461 Effusion, right knee: Secondary | ICD-10-CM | POA: Diagnosis not present

## 2022-12-12 MED ORDER — OXYCODONE HCL 5 MG PO TABS
ORAL_TABLET | ORAL | 0 refills | Status: DC
  Filled 2022-12-12: qty 30, 4d supply, fill #0

## 2022-12-13 NOTE — Telephone Encounter (Signed)
Called patient, advised samples are available and ready for pick up.   Patient advised his wife would come pick  up.   Eliquis 5 mg 2 boxes samples given.   Patient verbalized understanding

## 2022-12-13 NOTE — Telephone Encounter (Signed)
Ok to give samples through the end of the year '5mg'$  BID Can give 2 weeks of samples

## 2022-12-13 NOTE — Telephone Encounter (Signed)
Pt c/o medication issue:  1. Name of Medication:   apixaban (ELIQUIS) 5 MG TABS tablet   2. How are you currently taking this medication (dosage and times per day)? As prescribed  3. Are you having a reaction (difficulty breathing--STAT)?   4. What is your medication issue?   Patient is following-up on getting samples of this medication.  Patient stated he only has 2 tablets left.

## 2022-12-15 DIAGNOSIS — M25561 Pain in right knee: Secondary | ICD-10-CM | POA: Diagnosis not present

## 2022-12-15 DIAGNOSIS — Z96651 Presence of right artificial knee joint: Secondary | ICD-10-CM | POA: Diagnosis not present

## 2022-12-15 DIAGNOSIS — R29898 Other symptoms and signs involving the musculoskeletal system: Secondary | ICD-10-CM | POA: Diagnosis not present

## 2022-12-15 DIAGNOSIS — M25461 Effusion, right knee: Secondary | ICD-10-CM | POA: Diagnosis not present

## 2022-12-15 DIAGNOSIS — M25661 Stiffness of right knee, not elsewhere classified: Secondary | ICD-10-CM | POA: Diagnosis not present

## 2022-12-15 DIAGNOSIS — Z7409 Other reduced mobility: Secondary | ICD-10-CM | POA: Diagnosis not present

## 2022-12-19 ENCOUNTER — Other Ambulatory Visit (HOSPITAL_COMMUNITY): Payer: Self-pay

## 2022-12-19 DIAGNOSIS — M25661 Stiffness of right knee, not elsewhere classified: Secondary | ICD-10-CM | POA: Diagnosis not present

## 2022-12-19 DIAGNOSIS — Z7409 Other reduced mobility: Secondary | ICD-10-CM | POA: Diagnosis not present

## 2022-12-19 DIAGNOSIS — R29898 Other symptoms and signs involving the musculoskeletal system: Secondary | ICD-10-CM | POA: Diagnosis not present

## 2022-12-19 DIAGNOSIS — M25461 Effusion, right knee: Secondary | ICD-10-CM | POA: Diagnosis not present

## 2022-12-19 DIAGNOSIS — M25561 Pain in right knee: Secondary | ICD-10-CM | POA: Diagnosis not present

## 2022-12-19 DIAGNOSIS — Z96651 Presence of right artificial knee joint: Secondary | ICD-10-CM | POA: Diagnosis not present

## 2022-12-20 ENCOUNTER — Other Ambulatory Visit: Payer: Self-pay

## 2022-12-20 ENCOUNTER — Telehealth: Payer: Self-pay

## 2022-12-20 DIAGNOSIS — E1142 Type 2 diabetes mellitus with diabetic polyneuropathy: Secondary | ICD-10-CM

## 2022-12-20 MED ORDER — GABAPENTIN 300 MG PO CAPS: ORAL_CAPSULE | ORAL | 0 refills | Status: DC

## 2022-12-20 NOTE — Telephone Encounter (Signed)
Pt is requesting a refill on: gabapentin (NEURONTIN) 300 MG capsule   Pharmacy: [facility] ([facility]), [place] [address] 89/34/06 ROV 01/23/23

## 2022-12-20 NOTE — Telephone Encounter (Signed)
Refill has been sent to [facility]

## 2022-12-22 DIAGNOSIS — Z7409 Other reduced mobility: Secondary | ICD-10-CM | POA: Diagnosis not present

## 2022-12-22 DIAGNOSIS — M25461 Effusion, right knee: Secondary | ICD-10-CM | POA: Diagnosis not present

## 2022-12-22 DIAGNOSIS — Z96651 Presence of right artificial knee joint: Secondary | ICD-10-CM | POA: Diagnosis not present

## 2022-12-22 DIAGNOSIS — R29898 Other symptoms and signs involving the musculoskeletal system: Secondary | ICD-10-CM | POA: Diagnosis not present

## 2022-12-22 DIAGNOSIS — M25661 Stiffness of right knee, not elsewhere classified: Secondary | ICD-10-CM | POA: Diagnosis not present

## 2022-12-22 DIAGNOSIS — M25561 Pain in right knee: Secondary | ICD-10-CM | POA: Diagnosis not present

## 2022-12-26 DIAGNOSIS — R29898 Other symptoms and signs involving the musculoskeletal system: Secondary | ICD-10-CM | POA: Diagnosis not present

## 2022-12-26 DIAGNOSIS — M25461 Effusion, right knee: Secondary | ICD-10-CM | POA: Diagnosis not present

## 2022-12-26 DIAGNOSIS — Z7409 Other reduced mobility: Secondary | ICD-10-CM | POA: Diagnosis not present

## 2022-12-26 DIAGNOSIS — M25661 Stiffness of right knee, not elsewhere classified: Secondary | ICD-10-CM | POA: Diagnosis not present

## 2022-12-26 DIAGNOSIS — Z96651 Presence of right artificial knee joint: Secondary | ICD-10-CM | POA: Diagnosis not present

## 2022-12-26 DIAGNOSIS — M25561 Pain in right knee: Secondary | ICD-10-CM | POA: Diagnosis not present

## 2022-12-27 ENCOUNTER — Other Ambulatory Visit: Payer: Self-pay | Admitting: Cardiovascular Disease

## 2022-12-27 DIAGNOSIS — I480 Paroxysmal atrial fibrillation: Secondary | ICD-10-CM

## 2022-12-27 MED ORDER — APIXABAN 5 MG PO TABS: 5.0000 mg | ORAL_TABLET | Freq: Two times a day (BID) | ORAL | 5 refills | Status: DC

## 2022-12-27 NOTE — Telephone Encounter (Signed)
Prescription refill request for Eliquis received. Indication: Afib  Last office visit: 04/20/22 ([facility])  Scr: 0.74 (11/27/22)  Age: 72 Weight: 101.2kg  Appropriate dose and refill sent to requested pharmacy.

## 2022-12-27 NOTE — Telephone Encounter (Signed)
*  STAT* If patient is at the pharmacy, call can be transferred to refill team.   1. Which medications need to be refilled? (please list name of each medication and dose if known)  apixaban (ELIQUIS) 5 MG TABS tablet  2. Which pharmacy/location (including street and city if local pharmacy) is medication to be sent to? [facility] ([facility]), [place] [address]    3. Do they need a 30 day or 90 day supply?  30 day supply  Patient states he will be out of medication tomorrow

## 2022-12-28 ENCOUNTER — Ambulatory Visit: Payer: PPO | Admitting: Podiatry

## 2022-12-28 DIAGNOSIS — M79675 Pain in left toe(s): Secondary | ICD-10-CM

## 2022-12-28 DIAGNOSIS — M79674 Pain in right toe(s): Secondary | ICD-10-CM | POA: Diagnosis not present

## 2022-12-28 DIAGNOSIS — B351 Tinea unguium: Secondary | ICD-10-CM | POA: Diagnosis not present

## 2022-12-28 MED ORDER — DOXYCYCLINE HYCLATE 100 MG PO TABS: 100.0000 mg | ORAL_TABLET | Freq: Two times a day (BID) | ORAL | 1 refills | Status: DC

## 2022-12-28 NOTE — Progress Notes (Signed)
Subjective:   Patient ID: [name], male   DOB: 72 y.o.   MRN: [id number]   HPI Patient presents stating I need my nails cut and the second nail left became partially detached.  He had a knee replacement done about a month ago right   ROS      Objective:  Physical Exam  Neurovascular status unchanged with thick yellow brittle nailbeds 1-5 both feet with partially detached second nailbed left     Assessment:  Mycotic nail infection chronic 1-5 both feet with partially detached left second nail with patient is a diabetic     Plan:  Debrided nailbeds 1-5 both feet carefully debrided out the distal nailbed second left taking off the distal two thirds flushed it applied sterile dressing and as precautionary measure placed him on doxycycline twice daily

## 2022-12-28 NOTE — Patient Instructions (Signed)

## 2022-12-29 ENCOUNTER — Telehealth: Payer: Self-pay | Admitting: Cardiovascular Disease

## 2022-12-29 DIAGNOSIS — M25561 Pain in right knee: Secondary | ICD-10-CM | POA: Diagnosis not present

## 2022-12-29 DIAGNOSIS — M25461 Effusion, right knee: Secondary | ICD-10-CM | POA: Diagnosis not present

## 2022-12-29 DIAGNOSIS — Z7409 Other reduced mobility: Secondary | ICD-10-CM | POA: Diagnosis not present

## 2022-12-29 DIAGNOSIS — Z96651 Presence of right artificial knee joint: Secondary | ICD-10-CM | POA: Diagnosis not present

## 2022-12-29 DIAGNOSIS — R29898 Other symptoms and signs involving the musculoskeletal system: Secondary | ICD-10-CM | POA: Diagnosis not present

## 2022-12-29 DIAGNOSIS — M25661 Stiffness of right knee, not elsewhere classified: Secondary | ICD-10-CM | POA: Diagnosis not present

## 2022-12-29 DIAGNOSIS — I480 Paroxysmal atrial fibrillation: Secondary | ICD-10-CM

## 2022-12-29 MED ORDER — APIXABAN 5 MG PO TABS: 5.0000 mg | ORAL_TABLET | Freq: Two times a day (BID) | ORAL | 1 refills | Status: DC

## 2022-12-29 NOTE — Telephone Encounter (Signed)
*  STAT* If patient is at the pharmacy, call can be transferred to refill team.   1. Which medications need to be refilled? (please list name of each medication and dose if known) apixaban (ELIQUIS) 5 MG TABS tablet   2. Which pharmacy/location (including street and city if local pharmacy) is medication to be sent to?  [facility] ([facility]), [place] [address]    3. Do they need a 30 day or 90 day supply? [place]

## 2022-12-29 NOTE — Telephone Encounter (Signed)
Eliquis '5mg'$  refill request received. Patient is 72 years old, weight-101.2kg, Crea-0.74 on 11/27/2022, Diagnosis-Afib, and last seen by Dr. Sallyanne Kuster on 04/21/2022. Dose is appropriate based on dosing criteria. Will send in refill to requested pharmacy.

## 2023-01-02 DIAGNOSIS — Z7409 Other reduced mobility: Secondary | ICD-10-CM | POA: Diagnosis not present

## 2023-01-02 DIAGNOSIS — M25661 Stiffness of right knee, not elsewhere classified: Secondary | ICD-10-CM | POA: Diagnosis not present

## 2023-01-02 DIAGNOSIS — M25461 Effusion, right knee: Secondary | ICD-10-CM | POA: Diagnosis not present

## 2023-01-02 DIAGNOSIS — M25561 Pain in right knee: Secondary | ICD-10-CM | POA: Diagnosis not present

## 2023-01-02 DIAGNOSIS — Z96651 Presence of right artificial knee joint: Secondary | ICD-10-CM | POA: Diagnosis not present

## 2023-01-05 DIAGNOSIS — Z7409 Other reduced mobility: Secondary | ICD-10-CM | POA: Diagnosis not present

## 2023-01-05 DIAGNOSIS — Z96651 Presence of right artificial knee joint: Secondary | ICD-10-CM | POA: Diagnosis not present

## 2023-01-05 DIAGNOSIS — M25461 Effusion, right knee: Secondary | ICD-10-CM | POA: Diagnosis not present

## 2023-01-05 DIAGNOSIS — R29898 Other symptoms and signs involving the musculoskeletal system: Secondary | ICD-10-CM | POA: Diagnosis not present

## 2023-01-05 DIAGNOSIS — M25561 Pain in right knee: Secondary | ICD-10-CM | POA: Diagnosis not present

## 2023-01-05 DIAGNOSIS — M25661 Stiffness of right knee, not elsewhere classified: Secondary | ICD-10-CM | POA: Diagnosis not present

## 2023-01-09 DIAGNOSIS — Z96651 Presence of right artificial knee joint: Secondary | ICD-10-CM | POA: Diagnosis not present

## 2023-01-09 DIAGNOSIS — R29898 Other symptoms and signs involving the musculoskeletal system: Secondary | ICD-10-CM | POA: Diagnosis not present

## 2023-01-09 DIAGNOSIS — M25561 Pain in right knee: Secondary | ICD-10-CM | POA: Diagnosis not present

## 2023-01-09 DIAGNOSIS — Z7409 Other reduced mobility: Secondary | ICD-10-CM | POA: Diagnosis not present

## 2023-01-09 DIAGNOSIS — M25661 Stiffness of right knee, not elsewhere classified: Secondary | ICD-10-CM | POA: Diagnosis not present

## 2023-01-09 DIAGNOSIS — M25461 Effusion, right knee: Secondary | ICD-10-CM | POA: Diagnosis not present

## 2023-01-12 DIAGNOSIS — Z96651 Presence of right artificial knee joint: Secondary | ICD-10-CM | POA: Diagnosis not present

## 2023-01-12 DIAGNOSIS — M25661 Stiffness of right knee, not elsewhere classified: Secondary | ICD-10-CM | POA: Diagnosis not present

## 2023-01-12 DIAGNOSIS — Z7409 Other reduced mobility: Secondary | ICD-10-CM | POA: Diagnosis not present

## 2023-01-12 DIAGNOSIS — M25561 Pain in right knee: Secondary | ICD-10-CM | POA: Diagnosis not present

## 2023-01-12 DIAGNOSIS — M25461 Effusion, right knee: Secondary | ICD-10-CM | POA: Diagnosis not present

## 2023-01-16 DIAGNOSIS — M25461 Effusion, right knee: Secondary | ICD-10-CM | POA: Diagnosis not present

## 2023-01-16 DIAGNOSIS — R29898 Other symptoms and signs involving the musculoskeletal system: Secondary | ICD-10-CM | POA: Diagnosis not present

## 2023-01-16 DIAGNOSIS — M25561 Pain in right knee: Secondary | ICD-10-CM | POA: Diagnosis not present

## 2023-01-16 DIAGNOSIS — M25661 Stiffness of right knee, not elsewhere classified: Secondary | ICD-10-CM | POA: Diagnosis not present

## 2023-01-16 DIAGNOSIS — Z96651 Presence of right artificial knee joint: Secondary | ICD-10-CM | POA: Diagnosis not present

## 2023-01-16 DIAGNOSIS — Z7409 Other reduced mobility: Secondary | ICD-10-CM | POA: Diagnosis not present

## 2023-01-19 DIAGNOSIS — M25661 Stiffness of right knee, not elsewhere classified: Secondary | ICD-10-CM | POA: Diagnosis not present

## 2023-01-19 DIAGNOSIS — Z96651 Presence of right artificial knee joint: Secondary | ICD-10-CM | POA: Diagnosis not present

## 2023-01-19 DIAGNOSIS — R29898 Other symptoms and signs involving the musculoskeletal system: Secondary | ICD-10-CM | POA: Diagnosis not present

## 2023-01-19 DIAGNOSIS — M25561 Pain in right knee: Secondary | ICD-10-CM | POA: Diagnosis not present

## 2023-01-19 DIAGNOSIS — M25461 Effusion, right knee: Secondary | ICD-10-CM | POA: Diagnosis not present

## 2023-01-19 DIAGNOSIS — Z7409 Other reduced mobility: Secondary | ICD-10-CM | POA: Diagnosis not present

## 2023-01-22 NOTE — Progress Notes (Signed)
Established patient visit   Patient: [name]   DOB: April 02, 1951   72 y.o. Male  MRN: [id number] Visit Date: 01/23/2023   Chief Complaint  Patient presents with   Follow-up   Diabetes   Hyperlipidemia   Hypertension   Subjective    HPI  Follow up  -type 2 diabetes  --most recent HgbA1c 11/14/2022 - 7.1 -blood sugars are likely elevated.  -large glucose in the urine.  -taking glipizide twice daily. States he was told to hold off on Metformin XR daily.  -had right knee replacement surgery on 11/27/2022.  --still seeing orthopedic surgery and still going to physical therapy twice weekly. Has been having increased trouble with "holding his water."  -this is worse during the night  -he has quit smoking. Has not had a cigarette since 11/24/2022.  -having increased neuropathy in his feet. Basically both feet feel numb. Does take gabapentin 600 mg twice daily and notes no difference after he takes it.  --denies negative side effects from taking gabapentin.  -He denies chest pain, chest pressure, or shortness of breath. He denies headaches or visual disturbances. He denies abdominal pain, nausea, vomiting, or changes in bowel or bladder habits.     Medications: Outpatient Medications Prior to Visit  Medication Sig Note   acetaminophen (TYLENOL) 500 MG tablet Take 1,000 mg by mouth every 8 (eight) hours as needed for moderate pain.    apixaban (ELIQUIS) 5 MG TABS tablet Take 1 tablet (5 mg total) by mouth 2 (two) times daily.    atorvastatin (LIPITOR) 10 MG tablet Take 1 tablet (10 mg total) by mouth daily.    blood glucose meter kit and supplies KIT Dispense based on patient and insurance preference.    Continuous Blood Gluc Receiver (FREESTYLE LIBRE 14 DAY READER) DEVI 1 Device by Does not apply route daily. Use to check blood sugars every morning and 2 hours after largest meals    Continuous Blood Gluc Sensor (FREESTYLE LIBRE 14 DAY SENSOR) MISC USE AS DIRECTED    diclofenac  Sodium (VOLTAREN) 1 % GEL Apply 2 g topically 4 (four) times daily as needed.    doxycycline (VIBRA-TABS) 100 MG tablet Take 1 tablet (100 mg total) by mouth 2 (two) times daily.    glucose blood (FREESTYLE LITE) test strip Use to check blood sugars every morning fasting    lisinopril (ZESTRIL) 5 MG tablet Take 1 tablet by mouth once daily    methocarbamol (ROBAXIN) 500 MG tablet Take 1-2 tablets (500-1,000 mg total) by mouth every 6 (six) hours as needed for muscle spasms.    Multiple Vitamin (MULTIVITAMIN WITH MINERALS) TABS tablet Take 1 tablet by mouth daily. Men's One-A-Day Multivitamin    oxyCODONE (OXY IR/ROXICODONE) 5 MG immediate release tablet Take 1 tablet (5 mg total) by mouth every 4 (four) hours as needed for moderate pain (pain score 4-6).    oxyCODONE (OXY IR/ROXICODONE) 5 MG immediate release tablet Take 1 - 2 tablets (5 - 10 mg total) by mouth every 6 (six) hours as needed for up to 5 days    oxyCODONE (OXY IR/ROXICODONE) 5 MG immediate release tablet Take 1 - 2 tablets (5 - 10 mg total) by mouth every 6 (six) hours as needed.    traMADol (ULTRAM) 50 MG tablet Take 50 mg by mouth at bedtime.    [DISCONTINUED] gabapentin (NEURONTIN) 300 MG capsule Take 1 capTAKE 2 CAPSULES BY MOUTH IN THE MORNING AND 2 IN THE EVENING WITH  DINNER 01/23/2023: change  to lyrica   [DISCONTINUED] glipiZIDE (GLUCOTROL) 5 MG tablet Take 1 tablet (5 mg total) by mouth 2 (two) times daily before a meal.    [DISCONTINUED] metFORMIN (GLUCOPHAGE-XR) 500 MG 24 hr tablet Take 1 tablet (500 mg total) by mouth daily with breakfast. (Patient taking differently: Take 500 mg by mouth daily as needed (High Blood Sugar).)    [DISCONTINUED] tamsulosin (FLOMAX) 0.4 MG CAPS capsule Take 1 capsule by mouth once daily at bedtime    No facility-administered medications prior to visit.    Review of Systems  All other systems reviewed and are negative.   Last CBC Lab Results  Component Value Date   WBC 9.8 11/27/2022    HGB 13.4 11/27/2022   HCT 41.9 11/27/2022   MCV 91.3 11/27/2022   MCH 29.2 11/27/2022   RDW 13.8 11/27/2022   PLT 168 [id number]   Last metabolic panel Lab Results  Component Value Date   GLUCOSE 234 (H) 11/27/2022   NA 137 11/27/2022   K 5.0 11/27/2022   CL 104 11/27/2022   CO2 25 11/27/2022   BUN 17 11/27/2022   CREATININE 0.74 11/27/2022   GFRNONAA >60 11/27/2022   CALCIUM 8.8 (L) 11/27/2022   PROT 6.7 11/27/2022   ALBUMIN 3.8 11/27/2022   LABGLOB 2.5 10/23/2022   AGRATIO 1.8 10/23/2022   BILITOT 0.5 11/27/2022   ALKPHOS 92 11/27/2022   AST 14 (L) 11/27/2022   ALT 23 11/27/2022   ANIONGAP 8 11/27/2022   Last lipids Lab Results  Component Value Date   CHOL 77 (L) 03/21/2022   HDL 28 (L) 03/21/2022   LDLCALC 31 03/21/2022   TRIG 87 03/21/2022   CHOLHDL 2.8 03/21/2022   Last hemoglobin A1c Lab Results  Component Value Date   HGBA1C 7.1 (H) 11/14/2022   Last thyroid functions Lab Results  Component Value Date   TSH 1.610 03/21/2022    Last vitamin B12 and Folate Lab Results  Component Value Date   VITAMINB12 318 08/12/2016       Objective     Today's Vitals   01/23/23 0841  BP: 118/75  Pulse: 91  Resp: 18  SpO2: 97%  Weight: 227 lb (103 kg)  Height: '5\' 10"'$  (1.778 m)   Body mass index is 32.57 kg/m.  BP Readings from Last 3 Encounters:  02/07/23 111/76  01/24/23 132/74  01/23/23 118/75    Wt Readings from Last 3 Encounters:  02/07/23 215 lb (97.5 kg)  01/24/23 227 lb (103 kg)  01/23/23 227 lb (103 kg)    Physical Exam Vitals and nursing note reviewed.  Constitutional:      Appearance: Normal appearance. He is well-developed.  HENT:     Head: Normocephalic and atraumatic.     Nose: Nose normal.     Mouth/Throat:     Mouth: Mucous membranes are moist.     Pharynx: Oropharynx is clear.  Eyes:     Extraocular Movements: Extraocular movements intact.     Conjunctiva/sclera: Conjunctivae normal.     Pupils: Pupils are equal, round,  and reactive to light.  Cardiovascular:     Rate and Rhythm: Normal rate and regular rhythm.     Pulses: Normal pulses.     Heart sounds: Normal heart sounds.  Pulmonary:     Effort: Pulmonary effort is normal.     Breath sounds: Normal breath sounds.  Abdominal:     Palpations: Abdomen is soft.  Musculoskeletal:        General: Normal range  of motion.     Cervical back: Normal range of motion and neck supple.  Lymphadenopathy:     Cervical: No cervical adenopathy.  Skin:    General: Skin is warm and dry.     Capillary Refill: Capillary refill takes less than 2 seconds.  Neurological:     General: No focal deficit present.     Mental Status: He is alert and oriented to person, place, and time.  Psychiatric:        Mood and Affect: Mood normal.        Behavior: Behavior normal.        Thought Content: Thought content normal.        Judgment: Judgment normal.     Results for orders placed or performed in visit on 01/23/23  POCT UA - Microalbumin  Result Value Ref Range   Microalbumin Ur, POC 30 mg/L   Creatinine, POC 10 mg/dL   Albumin/Creatinine Ratio, Urine, POC >300   POCT Urinalysis Dipstick  Result Value Ref Range   Color, UA Yellow    Clarity, UA Clear    Glucose, UA Positive (A) Negative   Bilirubin, UA Negative    Ketones, UA Negative    Spec Grav, UA 1.010 1.010 - 1.025   Blood, UA Negative    pH, UA 5.5 5.0 - 8.0   Protein, UA Negative Negative   Urobilinogen, UA 0.2 0.2 or 1.0 E.U./dL   Nitrite, UA Negative    Leukocytes, UA Negative Negative   Appearance Unremarkable    Odor Unremarkable     Assessment & Plan    1. type 2 diabetes with peripheral neuropathy Moderate microalbuminuria. Add metformin XR 500 mg  daily. Change gabapentin to Lyrica 50 mg  twice daily. Will adjust dosing as needed and as indicated.  - POCT UA - Microalbumin - pregabalin (LYRICA) 50 MG capsule; Take 1 capsule (50 mg total) by mouth 2 (two) times daily.  Dispense: 60 capsule;  Refill: 2 - metFORMIN (GLUCOPHAGE-XR) 500 MG 24 hr tablet; Take 1 tablet (500 mg total) by mouth daily with breakfast.  Dispense: 90 tablet; Refill: 1  2. Hypertension associated with diabetes ([place]) Generally stable. Continue bp medication as prescribed.   3. Microalbuminuria due to type 2 diabetes mellitus (HCC) Moderate microalbuminuria. Add metformin XR 500 mg  daily. Continue other diabetic medication as prescribed   4. Urgency of urination Likely due to high blood sugar. Urine sample positive for glucose only.  - POCT Urinalysis Dipstick  5. Increased urinary frequency Take flomax 0.4 mg daily at bedtime.  - tamsulosin (FLOMAX) 0.4 MG CAPS capsule; Take 1 capsule (0.4 mg total) by mouth at bedtime.  Dispense: 90 capsule; Refill: 1  6. Status post total right knee replacement Doing well after surgery. Orthopedic visits as scheduled.     Problem List Items Addressed This Visit       Cardiovascular and Mediastinum   Hypertension associated with diabetes ([place])   Relevant Medications   metFORMIN (GLUCOPHAGE-XR) 500 MG 24 hr tablet     Endocrine   Diabetes mellitus type 2 in obese (HCC) - Primary   Relevant Medications   pregabalin (LYRICA) 50 MG capsule   metFORMIN (GLUCOPHAGE-XR) 500 MG 24 hr tablet   Other Relevant Orders   POCT UA - Microalbumin (Completed)   Microalbuminuria due to type 2 diabetes mellitus (HCC)   Relevant Medications   metFORMIN (GLUCOPHAGE-XR) 500 MG 24 hr tablet     Other   Increased urinary frequency  Relevant Medications   tamsulosin (FLOMAX) 0.4 MG CAPS capsule   S/P total knee replacement   Other Visit Diagnoses     Urgency of urination       Relevant Orders   POCT Urinalysis Dipstick (Completed)        Return in about 6 weeks (around 03/06/2023) for diabetes with HgbA1c check, 4 month needs MWV .         Ronnell Freshwater, NP  [facility] [phone] (phone) [phone] (fax)  [place]

## 2023-01-23 ENCOUNTER — Ambulatory Visit (INDEPENDENT_AMBULATORY_CARE_PROVIDER_SITE_OTHER): Payer: PPO | Admitting: Nurse Practitioner

## 2023-01-23 ENCOUNTER — Encounter: Payer: Self-pay | Admitting: Nurse Practitioner

## 2023-01-23 VITALS — BP 118/75 | HR 91 | Resp 18 | Ht 70.0 in | Wt 227.0 lb

## 2023-01-23 DIAGNOSIS — R3915 Urgency of urination: Secondary | ICD-10-CM | POA: Diagnosis not present

## 2023-01-23 DIAGNOSIS — E1169 Type 2 diabetes mellitus with other specified complication: Secondary | ICD-10-CM

## 2023-01-23 DIAGNOSIS — R350 Frequency of micturition: Secondary | ICD-10-CM | POA: Diagnosis not present

## 2023-01-23 DIAGNOSIS — E669 Obesity, unspecified: Secondary | ICD-10-CM | POA: Diagnosis not present

## 2023-01-23 DIAGNOSIS — E1129 Type 2 diabetes mellitus with other diabetic kidney complication: Secondary | ICD-10-CM | POA: Diagnosis not present

## 2023-01-23 DIAGNOSIS — R809 Proteinuria, unspecified: Secondary | ICD-10-CM

## 2023-01-23 DIAGNOSIS — E1159 Type 2 diabetes mellitus with other circulatory complications: Secondary | ICD-10-CM | POA: Diagnosis not present

## 2023-01-23 DIAGNOSIS — I152 Hypertension secondary to endocrine disorders: Secondary | ICD-10-CM

## 2023-01-23 DIAGNOSIS — Z96651 Presence of right artificial knee joint: Secondary | ICD-10-CM

## 2023-01-23 LAB — POCT UA - MICROALBUMIN
Albumin/Creatinine Ratio, Urine, POC: >300
Creatinine, POC: 10 mg/dL
Microalbumin Ur, POC: 30 mg/L

## 2023-01-23 LAB — POCT URINALYSIS DIPSTICK
Bilirubin, UA: NEGATIVE
Blood, UA: NEGATIVE
Glucose, UA: POSITIVE — AB
Ketones, UA: NEGATIVE
Leukocytes, UA: NEGATIVE
Nitrite, UA: NEGATIVE
Protein, UA: NEGATIVE
Spec Grav, UA: 1.01 (ref 1.010–1.025)
Urobilinogen, UA: 0.2 E.U./dL
pH, UA: 5.5 (ref 5.0–8.0)

## 2023-01-23 MED ORDER — PREGABALIN 50 MG PO CAPS: 50.0000 mg | ORAL_CAPSULE | Freq: Two times a day (BID) | ORAL | 2 refills | Status: DC

## 2023-01-23 MED ORDER — TAMSULOSIN HCL 0.4 MG PO CAPS: 0.4000 mg | ORAL_CAPSULE | Freq: Every day | ORAL | 1 refills | Status: DC

## 2023-01-23 MED ORDER — METFORMIN HCL ER 500 MG PO TB24: 500.0000 mg | ORAL_TABLET | Freq: Every day | ORAL | 1 refills | Status: DC

## 2023-01-24 ENCOUNTER — Encounter: Payer: Self-pay | Admitting: Urology

## 2023-01-24 ENCOUNTER — Ambulatory Visit: Payer: PPO | Admitting: Urology

## 2023-01-24 VITALS — BP 132/74 | HR 70 | Ht 70.0 in | Wt 227.0 lb

## 2023-01-24 DIAGNOSIS — R350 Frequency of micturition: Secondary | ICD-10-CM

## 2023-01-24 LAB — URINALYSIS, COMPLETE
Bilirubin, UA: NEGATIVE
Leukocytes,UA: NEGATIVE
Nitrite, UA: NEGATIVE
Protein,UA: NEGATIVE
RBC, UA: NEGATIVE
Specific Gravity, UA: <1.005 — ABNORMAL LOW (ref 1.005–1.030)
Urobilinogen, Ur: 0.2 mg/dL (ref 0.2–1.0)
pH, UA: 5 (ref 5.0–7.5)

## 2023-01-24 LAB — MICROSCOPIC EXAMINATION

## 2023-01-24 LAB — BLADDER SCAN AMB NON-IMAGING: Scan Result: 32

## 2023-01-24 MED ORDER — MIRABEGRON ER 50 MG PO TB24: 50.0000 mg | ORAL_TABLET | Freq: Every day | ORAL | 11 refills | Status: DC

## 2023-01-24 NOTE — Progress Notes (Signed)
Haze Rushing Plume,acting as a scribe for Hollice Espy, MD.,have documented all relevant documentation on the behalf of Hollice Espy, MD,as directed by  Hollice Espy, MD while in the presence of Hollice Espy, MD.  01/24/2023 4:31 PM   [name] May 20, 1951 [id number]  Referring provider: [name], PA-C No address on file  Chief Complaint  Patient presents with   Urinary Frequency    HPI 72 year old male who presents today for concern for urinary frequency.   He was last seen in clinic in February of 2023, at which time he underwent cystoscopy.He has a personal history of prostate cancer status post brachytherapy in [place] in 2019. He underwent cystoscopy that was fairly unremarkable. He had a 20 gram prostate. He did have evidence of radiation changes. At that time, a prescription for myrbetric was send in because he did have some improvement on this. It appears that he is not taking this anymore.   Urinalysis today has 6-10 WBC's but is otherwise unremarkable.   Today, he reports urinary incontinence. He states that he feels no pressure but it "just dribbles out".     Results for orders placed or performed in visit on 01/24/23  Microscopic Examination   Urine  Result Value Ref Range   WBC, UA 6-10 (A) 0 - 5 /hpf   RBC, Urine 0-2 0 - 2 /hpf   Epithelial Cells (non renal) 0-10 0 - 10 /hpf   Casts Present (A) None seen /lpf   Cast Type Hyaline casts N/A   Bacteria, UA Moderate (A) None seen/Few  Urinalysis, Complete  Result Value Ref Range   Specific Gravity, UA <1.005 (L) 1.005 - 1.030   pH, UA 5.0 5.0 - 7.5   Color, UA Yellow Yellow   Appearance Ur Clear Clear   Leukocytes,UA Negative Negative   Protein,UA Negative Negative/Trace   Glucose, UA 3+ (A) Negative   Ketones, UA Trace (A) Negative   RBC, UA Negative Negative   Bilirubin, UA Negative Negative   Urobilinogen, Ur 0.2 0.2 - 1.0 mg/dL   Nitrite, UA Negative Negative   Microscopic Examination  See below:   Bladder Scan (Post Void Residual) in office  Result Value Ref Range   Scan Result 32       PMH: Past Medical History:  Diagnosis Date   Abdominal aortic aneurysm (AAA) (HCC)    3 cm per 07-02-18 US abdominal aorta US epic   Arthritis    Blood clot in vein    behind left knee   Chronic venous insufficiency LOWER EXTREMITIES   Coronary artery disease CARDIOLOGIST - DR  [id number]-  LAST 1 WK AGO -- WILL REQUEST NOTE AND STRESS TEST   Diabetes mellitus without complication (HCC)    type 2   Hematuria    last year   History of kidney stones    Hyperglycemia    Hyperlipidemia    Hypertension    Mixed dyslipidemia    Myocardial infarction (HCC)    Neuropathy    toes only   Nocturia    Prostate cancer ([place]) 08/18/2011   gleason 8, volume 24.4cc   S/P CABG x 4    ST elevation MI (STEMI) ([place]) 02/10/2008   S/P CABG   Stroke ([place]) 2016   occ trouble wriing with right hand   Urinary hesitancy    Vision abnormalities    resolved now   Wound discharge    since jan 2019 goes to wound center [facility] left great toe  small open area changes dressing q 2 days with ointment provided by wound center, clear drainage occ    Surgical History: Past Surgical History:  Procedure Laterality Date   CARDIAC CATHETERIZATION  05/01/2012   grafts widely patent   CARDIOVASCULAR STRESS TEST  03-15-2010   INFERIOR WALL SCAR WITHOUT ANY MEANINGFUL ISCHEMIA/ EF 46% / LOW RISK SCAN   CATARACT EXTRACTION     CORONARY ARTERY BYPASS GRAFT  02-10-2008  DR Einar Gip   X4 VESSEL DISEASE / EMERGANT CABG   CYSTOSCOPY  02/08/2012   Procedure: CYSTOSCOPY FLEXIBLE;  Surgeon: Franchot Gallo, MD;  [facility];  Service: Urology;;   CYSTOSCOPY WITH RETROGRADE PYELOGRAM, URETEROSCOPY AND STENT PLACEMENT Right 09/12/2018   Procedure: CYSTOSCOPY WITH RETROGRADE PYELOGRAM, URETEROSCOPY AND STENT PLACEMENT, STONE EXTRACTION;  Surgeon: Franchot Gallo, MD;  Location: [facility];   Service: Urology;  Laterality: Right;   EP IMPLANTABLE DEVICE N/A 08/14/2016   Procedure: Loop Recorder Insertion;  Surgeon: Evans Lance, MD;  Location: [place] [facility];  Service: Cardiovascular;  Laterality: N/A;   EXTRACORPOREAL SHOCK WAVE LITHOTRIPSY  2013   HOLMIUM LASER APPLICATION Right 3/81/0175   Procedure: HOLMIUM LASER APPLICATION;  Surgeon: Franchot Gallo, MD;  Location: [facility];  Service: Urology;  Laterality: Right;   KNEE SURGERY  age 31   RIGHT   LEFT HEART CATHETERIZATION WITH CORONARY/GRAFT ANGIOGRAM N/A 05/01/2012   Procedure: LEFT HEART CATHETERIZATION WITH Beatrix Fetters;  Surgeon: Sanda Klein, MD;  Location: [facility];  Service: Cardiovascular;  Laterality: N/A;   MULTIPLE TEETH EXTRACTIONS (23)/ FOUR QUADRANT ALVEOLPLASTY/ MANDIBULAR LATERAL EXOSTOSES REDUCTIONS  03-24-2010   CHRONIC PERIODONTITIS   RADIOACTIVE SEED IMPLANT  02/08/2012   Procedure: RADIOACTIVE SEED IMPLANT;  Surgeon: Franchot Gallo, MD;  [facility];  Service: Urology;  Laterality: N/A;  C-ARM    SHOULDER SURGERY  1982   LEFT   TEE WITHOUT CARDIOVERSION N/A 08/14/2016   Procedure: TRANSESOPHAGEAL ECHOCARDIOGRAM (TEE);  Surgeon: Josue Hector, MD;  Location: [facility];  Service: Cardiovascular;  Laterality: N/A;   TOTAL KNEE ARTHROPLASTY Right 11/27/2022   Procedure: TOTAL KNEE ARTHROPLASTY;  Surgeon: Vickey Huger, MD;  Location: [facility];  Service: Orthopedics;  Laterality: Right;   URETERAL STENT PLACEMENT      Home Medications:  Allergies as of 01/24/2023       Reactions   Bee Venom Anaphylaxis   Penicillins Other (See Comments)   CAUSES "FREE BLEEDING" Has patient had a PCN reaction causing immediate rash, facial/tongue/throat swelling, SOB or lightheadedness with hypotension: No Has patient had a PCN reaction causing severe rash involving mucus membranes or skin necrosis: No Has patient had a PCN reaction that required hospitalization No Has  patient had a PCN reaction occurring within the last 10 years: No If all of the above answers are "NO", then may proceed with Cephalosporin use.        Medication List        Accurate as of January 24, 2023  4:31 PM. If you have any questions, ask your nurse or doctor.          acetaminophen 500 MG tablet Commonly known as: TYLENOL Take 1,000 mg by mouth every 8 (eight) hours as needed for moderate pain.   apixaban 5 MG Tabs tablet Commonly known as: Eliquis Take 1 tablet (5 mg total) by mouth 2 (two) times daily.   atorvastatin 10 MG tablet Commonly known as: LIPITOR Take 1 tablet (10 mg total) by mouth  daily.   blood glucose meter kit and supplies Kit Dispense based on patient and insurance preference.   diclofenac Sodium 1 % Gel Commonly known as: Voltaren Apply 2 g topically 4 (four) times daily as needed.   doxycycline 100 MG tablet Commonly known as: VIBRA-TABS Take 1 tablet (100 mg total) by mouth 2 (two) times daily.   FreeStyle Libre 14 Day Reader Kerrin Mo 1 Device by Does not apply route daily. Use to check blood sugars every morning and 2 hours after largest meals   FreeStyle Libre 14 Day Sensor Misc USE AS DIRECTED   glipiZIDE 5 MG tablet Commonly known as: GLUCOTROL Take 1 tablet (5 mg total) by mouth 2 (two) times daily before a meal.   glucose blood test strip Commonly known as: FREESTYLE LITE Use to check blood sugars every morning fasting   lisinopril 5 MG tablet Commonly known as: ZESTRIL Take 1 tablet by mouth once daily   metFORMIN 500 MG 24 hr tablet Commonly known as: GLUCOPHAGE-XR Take 1 tablet (500 mg total) by mouth daily with breakfast.   methocarbamol 500 MG tablet Commonly known as: ROBAXIN Take 1-2 tablets (500-1,000 mg total) by mouth every 6 (six) hours as needed for muscle spasms.   mirabegron ER 50 MG Tb24 tablet Commonly known as: MYRBETRIQ Take 1 tablet (50 mg total) by mouth daily. Started by: Hollice Espy, MD    multivitamin with minerals Tabs tablet Take 1 tablet by mouth daily. Men's One-A-Day Multivitamin   oxyCODONE 5 MG immediate release tablet Commonly known as: Oxy IR/ROXICODONE Take 1 tablet (5 mg total) by mouth every 4 (four) hours as needed for moderate pain (pain score 4-6).   oxyCODONE 5 MG immediate release tablet Commonly known as: Oxy IR/ROXICODONE Take 1 - 2 tablets (5 - 10 mg total) by mouth every 6 (six) hours as needed for up to 5 days   oxyCODONE 5 MG immediate release tablet Commonly known as: Oxy IR/ROXICODONE Take 1 - 2 tablets (5 - 10 mg total) by mouth every 6 (six) hours as needed.   pregabalin 50 MG capsule Commonly known as: Lyrica Take 1 capsule (50 mg total) by mouth 2 (two) times daily.   tamsulosin 0.4 MG Caps capsule Commonly known as: FLOMAX Take 1 capsule (0.4 mg total) by mouth at bedtime.   traMADol 50 MG tablet Commonly known as: ULTRAM Take 50 mg by mouth at bedtime.        Allergies:  Allergies  Allergen Reactions   Bee Venom Anaphylaxis   Penicillins Other (See Comments)    CAUSES "FREE BLEEDING" Has patient had a PCN reaction causing immediate rash, facial/tongue/throat swelling, SOB or lightheadedness with hypotension: No Has patient had a PCN reaction causing severe rash involving mucus membranes or skin necrosis: No Has patient had a PCN reaction that required hospitalization No Has patient had a PCN reaction occurring within the last 10 years: No If all of the above answers are "NO", then may proceed with Cephalosporin use.    Family History: Family History  Problem Relation Age of Onset   Cancer Father        pancreatic   Diabetes Father     Social History:  reports that he has been smoking cigarettes. He has a 25.00 pack-year smoking history. He has never been exposed to tobacco smoke. He has never used smokeless tobacco. He reports that he does not currently use alcohol after a past usage of about 2.0 standard drinks of  alcohol per  week. He reports that he does not use drugs.   Physical Exam: BP 132/74   Pulse 70   Ht '5\' 10"'$  (1.778 m)   Wt 227 lb (103 kg)   BMI 32.57 kg/m   Constitutional:  Alert and oriented, No acute distress. HEENT: [place] AT, moist mucus membranes.  Trachea midline, no masses. Neurologic: Grossly intact, no focal deficits, moving all 4 extremities. Psychiatric: Normal mood and affect.   Assessment & Plan:  1. Urgency frequency - History of radiation is a contributing factor. - There does not seem to be a UTI today.  - He is emptying well.  - We will send a urine culture to ensure that he does not have an infection.  - We will give him another 4 weeks of 50 mg samples of myrbetric.  -Follow up in 3 months with one of our P.A.s with a PVR.   Return in about 3 months (around 04/24/2023) for repeat PVR.   [place] [address], [place], [place] [address] [phone]

## 2023-01-26 DIAGNOSIS — M25561 Pain in right knee: Secondary | ICD-10-CM | POA: Diagnosis not present

## 2023-01-26 DIAGNOSIS — R29898 Other symptoms and signs involving the musculoskeletal system: Secondary | ICD-10-CM | POA: Diagnosis not present

## 2023-01-26 DIAGNOSIS — Z96651 Presence of right artificial knee joint: Secondary | ICD-10-CM | POA: Diagnosis not present

## 2023-01-26 DIAGNOSIS — M25461 Effusion, right knee: Secondary | ICD-10-CM | POA: Diagnosis not present

## 2023-01-26 DIAGNOSIS — M25661 Stiffness of right knee, not elsewhere classified: Secondary | ICD-10-CM | POA: Diagnosis not present

## 2023-01-26 DIAGNOSIS — Z7409 Other reduced mobility: Secondary | ICD-10-CM | POA: Diagnosis not present

## 2023-01-31 DIAGNOSIS — M25561 Pain in right knee: Secondary | ICD-10-CM | POA: Diagnosis not present

## 2023-01-31 DIAGNOSIS — M25661 Stiffness of right knee, not elsewhere classified: Secondary | ICD-10-CM | POA: Diagnosis not present

## 2023-01-31 DIAGNOSIS — Z96651 Presence of right artificial knee joint: Secondary | ICD-10-CM | POA: Diagnosis not present

## 2023-01-31 DIAGNOSIS — Z7409 Other reduced mobility: Secondary | ICD-10-CM | POA: Diagnosis not present

## 2023-01-31 DIAGNOSIS — M25461 Effusion, right knee: Secondary | ICD-10-CM | POA: Diagnosis not present

## 2023-01-31 DIAGNOSIS — R29898 Other symptoms and signs involving the musculoskeletal system: Secondary | ICD-10-CM | POA: Diagnosis not present

## 2023-02-07 ENCOUNTER — Encounter: Payer: Self-pay | Admitting: Emergency Medicine

## 2023-02-07 ENCOUNTER — Ambulatory Visit
Admission: EM | Admit: 2023-02-07 | Discharge: 2023-02-07 | Disposition: A | Discharge status: Home / Self Care | Payer: PPO | Attending: Physician Assistant | Admitting: Physician Assistant

## 2023-02-07 DIAGNOSIS — R051 Acute cough: Secondary | ICD-10-CM

## 2023-02-07 DIAGNOSIS — J069 Acute upper respiratory infection, unspecified: Secondary | ICD-10-CM

## 2023-02-07 DIAGNOSIS — R1319 Other dysphagia: Secondary | ICD-10-CM | POA: Diagnosis not present

## 2023-02-07 DIAGNOSIS — R1013 Epigastric pain: Secondary | ICD-10-CM

## 2023-02-07 MED ORDER — OMEPRAZOLE 20 MG PO CPDR: 20.0000 mg | DELAYED_RELEASE_CAPSULE | Freq: Every day | ORAL | 0 refills | Status: DC

## 2023-02-07 MED ORDER — DM-GUAIFENESIN ER 30-600 MG PO TB12: 1.0000 | ORAL_TABLET | Freq: Two times a day (BID) | ORAL | 0 refills | Status: DC

## 2023-02-07 NOTE — ED Triage Notes (Signed)
Patient c/o slight productive cough, nasal drainage and congestion x 3 days.  Patient has taken Iva.

## 2023-02-07 NOTE — ED Provider Notes (Signed)
[name]    CSN: [id number] Arrival date & time: 02/07/23  0935      History   Chief Complaint Chief Complaint  Patient presents with   Cough    HPI [name] is a 72 y.o. male.   72 year old male presents with cough and dysphagia.  Patient indicates that he had knee replacement surgery December 2023.  He indicates since having the surgery and being placed under anesthesia he has been having difficulty swallowing.  He indicates that he has problems swallowing solid foods to where it feels like it gets stuck.  He indicates that recently he started having problems intermittently to where he will drink liquids coffee and sometimes it feels like that is going to get stuck.  He indicates also he has been having heartburn, indigestion, and intermittent reflux.  He has not had any nausea or vomiting.  Patient indicates he does have an appointment to see his PCP on March 06, 2023. Patient indicates for the past 5 days he has been having mild upper respiratory symptoms cough, congestion, rhinitis, clear production.  He indicates that he has also had mild fatigue with the upper respiratory symptoms.  He has not had any fever, chills, body aches or pains.  Patient is without wheezing or shortness of breath.  He is tolerating fluids well.   Cough   Past Medical History:  Diagnosis Date   Abdominal aortic aneurysm (AAA) (HCC)    3 cm per 07-02-18 US abdominal aorta US epic   Arthritis    Blood clot in vein    behind left knee   Chronic venous insufficiency LOWER EXTREMITIES   Coronary artery disease CARDIOLOGIST - DR  [id number]  LAST 1 WK AGO -- WILL REQUEST NOTE AND STRESS TEST   Diabetes mellitus without complication ([place])    type 2   Hematuria    last year   History of kidney stones    Hyperglycemia    Hyperlipidemia    Hypertension    Mixed dyslipidemia    Myocardial infarction (HCC)    Neuropathy    toes only   Nocturia    Prostate cancer ([place]) 08/18/2011    gleason 8, volume 24.4cc   S/P CABG x 4    ST elevation MI (STEMI) ([place]) 02/10/2008   S/P CABG   Stroke ([place]) 2016   occ trouble wriing with right hand   Urinary hesitancy    Vision abnormalities    resolved now   Wound discharge    since jan 2019 goes to wound center [place] [facility] left great toe small open area changes dressing q 2 days with ointment provided by wound center, clear drainage occ    Patient Active Problem List   Diagnosis Date Noted   S/P total knee replacement 11/27/2022   Lung nodule 06/19/2022   Microalbuminuria due to type 2 diabetes mellitus ([place]) 04/19/2020   Mixed diabetic hyperlipidemia associated with type 2 diabetes mellitus ([place]) 04/19/2020   Diabetic polyneuropathy associated with type 2 diabetes mellitus ([place]) 04/19/2020   Wound of left leg 09/11/2019   Tick bite of right lower leg 04/21/2019   Dyslipidemia (high LDL; low HDL) 01/28/2019   PAD (peripheral artery disease) ([place]) 01/28/2019   Great toe pain, left 10/21/2018   Encounter for Medicare annual wellness exam 06/20/2018   Laceration of left lower leg 04/17/2018   Laceration of skin of left lower leg 04/01/2018   Increased urinary frequency 04/01/2018   Hematuria 01/30/2018   Puncture  wound of toe of left foot 01/07/2018   At risk for diabetic foot ulcer 01/07/2018   Need for pneumococcal vaccination 11/27/2017   Screening for colon cancer 11/27/2017   Healthcare maintenance 11/27/2017   Paroxysmal atrial fibrillation ([place]) 11/22/2016   History of embolic stroke [id number]   Diabetes mellitus type 2 in obese ([place]) 11/22/2016   Venous insufficiency of both lower extremities 09/21/2016   Stroke ([place]) 08/12/2016   Hyperglycemia    Hypertension associated with diabetes ([place])    Vision, loss, sudden    CVA (cerebral infarction) 08/11/2016   CAD - inferior MI 2009, CABG 2009, patent grafts cath 04/2012 09/23/2013   Morbid obesity ([place]) 09/23/2013   Hyperlipidemia 09/23/2013   Tobacco abuse  09/23/2013   Prostate cancer ([place]) 08/18/2011   Myocardial infarction [facility])     Past Surgical History:  Procedure Laterality Date   CARDIAC CATHETERIZATION  05/01/2012   grafts widely patent   CARDIOVASCULAR STRESS TEST  03-15-2010   INFERIOR WALL SCAR WITHOUT ANY MEANINGFUL ISCHEMIA/ EF 46% / LOW RISK SCAN   CATARACT EXTRACTION     CORONARY ARTERY BYPASS GRAFT  02-10-2008  DR Einar Gip   X4 VESSEL DISEASE / EMERGANT CABG   CYSTOSCOPY  02/08/2012   Procedure: CYSTOSCOPY FLEXIBLE;  Surgeon: Franchot Gallo, MD;  Location: [facility];  Service: Urology;;   CYSTOSCOPY WITH RETROGRADE PYELOGRAM, URETEROSCOPY AND STENT PLACEMENT Right 09/12/2018   Procedure: CYSTOSCOPY WITH RETROGRADE PYELOGRAM, URETEROSCOPY AND STENT PLACEMENT, STONE EXTRACTION;  Surgeon: Franchot Gallo, MD;  Location: [facility];  Service: Urology;  Laterality: Right;   EP IMPLANTABLE DEVICE N/A 08/14/2016   Procedure: Loop Recorder Insertion;  Surgeon: Evans Lance, MD;  Location: [facility];  Service: Cardiovascular;  Laterality: N/A;   EXTRACORPOREAL SHOCK WAVE LITHOTRIPSY  2013   HOLMIUM LASER APPLICATION Right [id number]   Procedure: HOLMIUM LASER APPLICATION;  Surgeon: Franchot Gallo, MD;  Location: [facility];  Service: Urology;  Laterality: Right;   KNEE SURGERY  age 23   RIGHT   LEFT HEART CATHETERIZATION WITH CORONARY/GRAFT ANGIOGRAM N/A 05/01/2012   Procedure: LEFT HEART CATHETERIZATION WITH Beatrix Fetters;  Surgeon: Sanda Klein, MD;  Location: [facility];  Service: Cardiovascular;  Laterality: N/A;   MULTIPLE TEETH EXTRACTIONS (23)/ FOUR QUADRANT ALVEOLPLASTY/ MANDIBULAR LATERAL EXOSTOSES REDUCTIONS  03-24-2010   CHRONIC PERIODONTITIS   RADIOACTIVE SEED IMPLANT  02/08/2012   Procedure: RADIOACTIVE SEED IMPLANT;  Surgeon: Franchot Gallo, MD;  Location: [facility];  Service: Urology;  Laterality: N/A;  C-ARM    SHOULDER SURGERY  1982   LEFT   TEE WITHOUT  CARDIOVERSION N/A 08/14/2016   Procedure: TRANSESOPHAGEAL ECHOCARDIOGRAM (TEE);  Surgeon: Josue Hector, MD;  Location: [facility];  Service: Cardiovascular;  Laterality: N/A;   TOTAL KNEE ARTHROPLASTY Right 11/27/2022   Procedure: TOTAL KNEE ARTHROPLASTY;  Surgeon: Vickey Huger, MD;  Location: [facility];  Service: Orthopedics;  Laterality: Right;   URETERAL STENT PLACEMENT         Home Medications    Prior to Admission medications   Medication Sig Start Date End Date Taking? Authorizing Provider  acetaminophen (TYLENOL) 500 MG tablet Take 1,000 mg by mouth every 8 (eight) hours as needed for moderate pain.   Yes [provider]  apixaban (ELIQUIS) 5 MG TABS tablet Take 1 tablet (5 mg total) by mouth 2 (two) times daily. 12/29/22  Yes Croitoru, Mihai, MD  atorvastatin (LIPITOR) 10 MG tablet Take 1 tablet (10 mg total) by mouth  daily. 06/16/22  Yes Abonza, Maritza, PA-C  blood glucose meter kit and supplies KIT Dispense based on patient and insurance preference. 10/20/20  Yes [name], PA-C  Continuous Blood Gluc Receiver (FREESTYLE LIBRE 14 DAY READER) DEVI 1 Device by Does not apply route daily. Use to check blood sugars every morning and 2 hours after largest meals 02/10/21  Yes Abonza, Maritza, PA-C  Continuous Blood Gluc Sensor (FREESTYLE LIBRE 14 DAY SENSOR) MISC USE AS DIRECTED 11/22/22  Yes Boscia, Heather E, NP  dextromethorphan-guaiFENesin (MUCINEX DM) 30-600 MG 12hr tablet Take 1 tablet by mouth 2 (two) times daily. 02/07/23  Yes Nyoka Lint, PA-C  diclofenac Sodium (VOLTAREN) 1 % GEL Apply 2 g topically 4 (four) times daily as needed. 03/27/21  Yes Long, Wonda Olds, MD  glipiZIDE (GLUCOTROL) 5 MG tablet Take 1 tablet (5 mg total) by mouth 2 (two) times daily before a meal. 09/25/22  Yes Abonza, Maritza, PA-C  glucose blood (FREESTYLE LITE) test strip Use to check blood sugars every morning fasting 04/02/18  Yes Danford, Katy D, NP  lisinopril (ZESTRIL) 5 MG tablet Take 1 tablet  by mouth once daily 10/16/22  Yes Abonza, Maritza, PA-C  metFORMIN (GLUCOPHAGE-XR) 500 MG 24 hr tablet Take 1 tablet (500 mg total) by mouth daily with breakfast. 01/23/23  Yes Boscia, Heather E, NP  mirabegron ER (MYRBETRIQ) 50 MG TB24 tablet Take 1 tablet (50 mg total) by mouth daily. 01/24/23  Yes Hollice Espy, MD  Multiple Vitamin (MULTIVITAMIN WITH MINERALS) TABS tablet Take 1 tablet by mouth daily. Men's One-A-Day Multivitamin   Yes [provider]  omeprazole (PRILOSEC) 20 MG capsule Take 1 capsule (20 mg total) by mouth daily. For indigestion and reflux. 02/07/23  Yes Nyoka Lint, PA-C  oxyCODONE (OXY IR/ROXICODONE) 5 MG immediate release tablet Take 1 tablet (5 mg total) by mouth every 4 (four) hours as needed for moderate pain (pain score 4-6). 11/28/22  Yes Donia Ast, PA  pregabalin (LYRICA) 50 MG capsule Take 1 capsule (50 mg total) by mouth 2 (two) times daily. 01/23/23  Yes Boscia, Greer Ee, NP  tamsulosin (FLOMAX) 0.4 MG CAPS capsule Take 1 capsule (0.4 mg total) by mouth at bedtime. 01/23/23  Yes Boscia, Greer Ee, NP  traMADol (ULTRAM) 50 MG tablet Take 50 mg by mouth at bedtime. 10/23/22  Yes [provider]  doxycycline (VIBRA-TABS) 100 MG tablet Take 1 tablet (100 mg total) by mouth 2 (two) times daily. 12/28/22   Wallene Huh, DPM  methocarbamol (ROBAXIN) 500 MG tablet Take 1-2 tablets (500-1,000 mg total) by mouth every 6 (six) hours as needed for muscle spasms. 11/28/22   Donia Ast, PA  oxyCODONE (OXY IR/ROXICODONE) 5 MG immediate release tablet Take 1 - 2 tablets (5 - 10 mg total) by mouth every 6 (six) hours as needed for up to 5 days 12/07/22     oxyCODONE (OXY IR/ROXICODONE) 5 MG immediate release tablet Take 1 - 2 tablets (5 - 10 mg total) by mouth every 6 (six) hours as needed. 12/12/22       Family History Family History  Problem Relation Age of Onset   Cancer Father        pancreatic   Diabetes Father     Social  History Social History   Tobacco Use   Smoking status: Former    Packs/day: 0.50    Years: 50.00    Total pack years: 25.00    Types: Cigarettes    Passive exposure:  Never   Smokeless tobacco: Never   Tobacco comments:    PREVIOUSLY SMOKED 3PPD / DECREASED TO 1PPD SINCE 2009  Vaping Use   Vaping Use: Never used  Substance Use Topics   Alcohol use: Not Currently    Alcohol/week: 2.0 standard drinks of alcohol    Types: 1 Cans of beer, 1 Shots of liquor per week   Drug use: No     Allergies   Bee venom and Penicillins   Review of Systems Review of Systems  Respiratory:  Positive for cough.      Physical Exam Triage Vital Signs ED Triage Vitals  Enc Vitals Group     BP 02/07/23 1126 111/76     Pulse Rate 02/07/23 1126 84     Resp 02/07/23 1126 18     Temp 02/07/23 1126 97.8 F (36.6 C)     Temp Source 02/07/23 1126 Oral     SpO2 02/07/23 1126 94 %     Weight 02/07/23 1127 215 lb (97.5 kg)     Height 02/07/23 1127 5' 10"$  (1.778 m)     Head Circumference --      Peak Flow --      Pain Score 02/07/23 1127 0     Pain Loc --      Pain Edu? --      Excl. in [place]? --    No data found.  Updated Vital Signs BP 111/76 (BP Location: Left Arm)   Pulse 84   Temp 97.8 F (36.6 C) (Oral)   Resp 18   Ht 5' 10"$  (1.778 m)   Wt 215 lb (97.5 kg)   SpO2 94%   BMI 30.85 kg/m   Visual Acuity Right Eye Distance:   Left Eye Distance:   Bilateral Distance:    Right Eye Near:   Left Eye Near:    Bilateral Near:     Physical Exam Constitutional:      Appearance: Normal appearance.  HENT:     Right Ear: Tympanic membrane and ear canal normal.     Left Ear: Tympanic membrane and ear canal normal.     Mouth/Throat:     Mouth: Mucous membranes are moist.     Pharynx: Oropharynx is clear.  Cardiovascular:     Rate and Rhythm: Normal rate and regular rhythm.     Heart sounds: Normal heart sounds.  Pulmonary:     Effort: Pulmonary effort is normal.     Breath sounds:  Normal breath sounds and air entry. No wheezing, rhonchi or rales.  Abdominal:     General: Abdomen is flat. Bowel sounds are normal.     Palpations: Abdomen is soft.     Tenderness: There is no abdominal tenderness.  Neurological:     Mental Status: He is alert.      UC Treatments / Results  Labs (all labs ordered are listed, but only abnormal results are displayed) Labs Reviewed - No data to display  EKG   Radiology No results found.  Procedures Procedures (including critical care time)  Medications Ordered in UC Medications - No data to display  Initial Impression / Assessment and Plan / UC Course  I have reviewed the triage vital signs and the nursing notes.  Pertinent labs & imaging results that were available during my care of the patient were reviewed by me and considered in my medical decision making (see chart for details).    Plan: The diagnosis will be treated with the following:  1.  Esophageal dysphagia: A.  Prilosec 20 mg once daily to help decrease symptoms and advised to continue a soft diet. 2.  Dyspepsia: A.  Prilosec 20 mg once daily to control dyspepsia symptoms. 3.  Upper respiratory tract infection: A.  Mucinex DM every 12 hours to control cough and congestion. 4.  Acute cough: A.  Mucinex DM every 12 hours to control cough and congestion. 5.  The patient advised to follow-up with PCP on March 12 at scheduled appointment to have the dysphagia and dyspepsia further evaluated if not improved with Prilosec 20 mg daily or sooner if symptoms worsen. Final Clinical Impressions(s) / UC Diagnoses   Final diagnoses:  Esophageal dysphagia  Dyspepsia  Acute cough  Acute upper respiratory infection     Discharge Instructions      Advised take Prilosec 20 mg once daily to help decrease indigestion and reflux. Advised take the Mucinex DM every 12 hours to help decrease cough and congestion.  Advised to follow-up with PCP at scheduled appointment to  further evaluate the difficulty swallowing and the indigestion.  Advised to there is needed.    ED Prescriptions     Medication Sig Dispense Auth. Provider   omeprazole (PRILOSEC) 20 MG capsule Take 1 capsule (20 mg total) by mouth daily. For indigestion and reflux. 30 capsule Nyoka Lint, PA-C   dextromethorphan-guaiFENesin [facility] DM) 30-600 MG 12hr tablet Take 1 tablet by mouth 2 (two) times daily. 20 tablet Nyoka Lint, PA-C      PDMP not reviewed this encounter.   Nyoka Lint, PA-C 02/07/23 1157

## 2023-02-07 NOTE — Discharge Instructions (Signed)
Advised take Prilosec 20 mg once daily to help decrease indigestion and reflux. Advised take the Mucinex DM every 12 hours to help decrease cough and congestion.  Advised to follow-up with PCP at scheduled appointment to further evaluate the difficulty swallowing and the indigestion.  Advised to there is needed.

## 2023-02-09 DIAGNOSIS — R29898 Other symptoms and signs involving the musculoskeletal system: Secondary | ICD-10-CM | POA: Diagnosis not present

## 2023-02-09 DIAGNOSIS — Z96651 Presence of right artificial knee joint: Secondary | ICD-10-CM | POA: Diagnosis not present

## 2023-02-09 DIAGNOSIS — Z7409 Other reduced mobility: Secondary | ICD-10-CM | POA: Diagnosis not present

## 2023-02-09 DIAGNOSIS — M25661 Stiffness of right knee, not elsewhere classified: Secondary | ICD-10-CM | POA: Diagnosis not present

## 2023-02-09 DIAGNOSIS — M25561 Pain in right knee: Secondary | ICD-10-CM | POA: Diagnosis not present

## 2023-02-09 DIAGNOSIS — M25461 Effusion, right knee: Secondary | ICD-10-CM | POA: Diagnosis not present

## 2023-02-12 ENCOUNTER — Telehealth: Payer: Self-pay | Admitting: *Deleted

## 2023-02-12 DIAGNOSIS — E1169 Type 2 diabetes mellitus with other specified complication: Secondary | ICD-10-CM

## 2023-02-12 MED ORDER — GLIPIZIDE 5 MG PO TABS: 5.0000 mg | ORAL_TABLET | Freq: Two times a day (BID) | ORAL | 0 refills | Status: DC

## 2023-02-12 NOTE — Telephone Encounter (Signed)
30 day sent

## 2023-02-12 NOTE — Telephone Encounter (Signed)
Pt representative calling stating that pt has been trying to get refill on below, informed her that I did not see any recent request. Did not leave name or contact information.        glipiZIDE (GLUCOTROL) 5 MG tablet    [facility] ([facility]), [place] - [address]    [phone] ROV:03/07/23

## 2023-02-14 DIAGNOSIS — Z96651 Presence of right artificial knee joint: Secondary | ICD-10-CM | POA: Diagnosis not present

## 2023-02-14 DIAGNOSIS — M25461 Effusion, right knee: Secondary | ICD-10-CM | POA: Diagnosis not present

## 2023-02-14 DIAGNOSIS — M25561 Pain in right knee: Secondary | ICD-10-CM | POA: Diagnosis not present

## 2023-02-14 DIAGNOSIS — R29898 Other symptoms and signs involving the musculoskeletal system: Secondary | ICD-10-CM | POA: Diagnosis not present

## 2023-02-14 DIAGNOSIS — M25661 Stiffness of right knee, not elsewhere classified: Secondary | ICD-10-CM | POA: Diagnosis not present

## 2023-02-14 DIAGNOSIS — Z7409 Other reduced mobility: Secondary | ICD-10-CM | POA: Diagnosis not present

## 2023-02-21 DIAGNOSIS — R29898 Other symptoms and signs involving the musculoskeletal system: Secondary | ICD-10-CM | POA: Diagnosis not present

## 2023-02-21 DIAGNOSIS — M25461 Effusion, right knee: Secondary | ICD-10-CM | POA: Diagnosis not present

## 2023-02-21 DIAGNOSIS — Z7409 Other reduced mobility: Secondary | ICD-10-CM | POA: Diagnosis not present

## 2023-02-21 DIAGNOSIS — M25561 Pain in right knee: Secondary | ICD-10-CM | POA: Diagnosis not present

## 2023-02-21 DIAGNOSIS — Z96651 Presence of right artificial knee joint: Secondary | ICD-10-CM | POA: Diagnosis not present

## 2023-02-21 DIAGNOSIS — M25661 Stiffness of right knee, not elsewhere classified: Secondary | ICD-10-CM | POA: Diagnosis not present

## 2023-02-27 ENCOUNTER — Other Ambulatory Visit: Payer: Self-pay | Admitting: Family Medicine

## 2023-02-27 DIAGNOSIS — E1169 Type 2 diabetes mellitus with other specified complication: Secondary | ICD-10-CM

## 2023-02-28 ENCOUNTER — Other Ambulatory Visit: Payer: Self-pay

## 2023-02-28 DIAGNOSIS — E1159 Type 2 diabetes mellitus with other circulatory complications: Secondary | ICD-10-CM

## 2023-02-28 DIAGNOSIS — E1129 Type 2 diabetes mellitus with other diabetic kidney complication: Secondary | ICD-10-CM

## 2023-02-28 MED ORDER — LISINOPRIL 5 MG PO TABS: 5.0000 mg | ORAL_TABLET | Freq: Every day | ORAL | 1 refills | Status: DC

## 2023-03-02 ENCOUNTER — Other Ambulatory Visit: Payer: Self-pay | Admitting: Family Medicine

## 2023-03-02 DIAGNOSIS — Z96651 Presence of right artificial knee joint: Secondary | ICD-10-CM | POA: Diagnosis not present

## 2023-03-02 DIAGNOSIS — E1169 Type 2 diabetes mellitus with other specified complication: Secondary | ICD-10-CM

## 2023-03-07 ENCOUNTER — Encounter: Payer: Self-pay | Admitting: Family Medicine

## 2023-03-07 ENCOUNTER — Ambulatory Visit (INDEPENDENT_AMBULATORY_CARE_PROVIDER_SITE_OTHER): Payer: PPO | Admitting: Family Medicine

## 2023-03-07 ENCOUNTER — Other Ambulatory Visit: Payer: Self-pay

## 2023-03-07 ENCOUNTER — Telehealth: Payer: Self-pay | Admitting: Cardiovascular Disease

## 2023-03-07 VITALS — BP 137/88 | HR 75 | Resp 18 | Ht 70.0 in | Wt 238.0 lb

## 2023-03-07 DIAGNOSIS — R350 Frequency of micturition: Secondary | ICD-10-CM | POA: Diagnosis not present

## 2023-03-07 DIAGNOSIS — R0609 Other forms of dyspnea: Secondary | ICD-10-CM | POA: Diagnosis not present

## 2023-03-07 DIAGNOSIS — R600 Localized edema: Secondary | ICD-10-CM | POA: Diagnosis not present

## 2023-03-07 DIAGNOSIS — E1129 Type 2 diabetes mellitus with other diabetic kidney complication: Secondary | ICD-10-CM | POA: Diagnosis not present

## 2023-03-07 DIAGNOSIS — E1159 Type 2 diabetes mellitus with other circulatory complications: Secondary | ICD-10-CM | POA: Diagnosis not present

## 2023-03-07 DIAGNOSIS — R809 Proteinuria, unspecified: Secondary | ICD-10-CM

## 2023-03-07 DIAGNOSIS — I152 Hypertension secondary to endocrine disorders: Secondary | ICD-10-CM | POA: Diagnosis not present

## 2023-03-07 DIAGNOSIS — E1165 Type 2 diabetes mellitus with hyperglycemia: Secondary | ICD-10-CM

## 2023-03-07 DIAGNOSIS — I110 Hypertensive heart disease with heart failure: Secondary | ICD-10-CM | POA: Insufficient documentation

## 2023-03-07 DIAGNOSIS — E669 Obesity, unspecified: Secondary | ICD-10-CM | POA: Diagnosis not present

## 2023-03-07 DIAGNOSIS — E1169 Type 2 diabetes mellitus with other specified complication: Secondary | ICD-10-CM | POA: Diagnosis not present

## 2023-03-07 DIAGNOSIS — E782 Mixed hyperlipidemia: Secondary | ICD-10-CM | POA: Diagnosis not present

## 2023-03-07 LAB — POCT GLYCOSYLATED HEMOGLOBIN (HGB A1C): HbA1c POC (<> result, manual entry): 11.8 % (ref 4.0–5.6)

## 2023-03-07 MED ORDER — TAMSULOSIN HCL 0.4 MG PO CAPS: 0.4000 mg | ORAL_CAPSULE | Freq: Every day | ORAL | 1 refills | Status: DC

## 2023-03-07 MED ORDER — SEMAGLUTIDE 7 MG PO TABS: 1.0000 | ORAL_TABLET | Freq: Every day | ORAL | 0 refills | Status: AC

## 2023-03-07 MED ORDER — FUROSEMIDE 20 MG PO TABS: 20.0000 mg | ORAL_TABLET | Freq: Every day | ORAL | 3 refills | Status: DC

## 2023-03-07 MED ORDER — ATORVASTATIN CALCIUM 10 MG PO TABS: 10.0000 mg | ORAL_TABLET | Freq: Every day | ORAL | 1 refills | Status: DC

## 2023-03-07 MED ORDER — LISINOPRIL 5 MG PO TABS: 5.0000 mg | ORAL_TABLET | Freq: Every day | ORAL | 1 refills | Status: DC

## 2023-03-07 MED ORDER — GLIPIZIDE 5 MG PO TABS: 5.0000 mg | ORAL_TABLET | Freq: Two times a day (BID) | ORAL | 0 refills | Status: DC

## 2023-03-07 MED ORDER — PREGABALIN 50 MG PO CAPS: 50.0000 mg | ORAL_CAPSULE | Freq: Two times a day (BID) | ORAL | 2 refills | Status: DC

## 2023-03-07 NOTE — Assessment & Plan Note (Signed)
No prior history of heart failure, but patient does have multiple cardiovascular risk factors including history of MI, CAD, stroke, bilateral LE venous insufficiency/PAD, hypertension, hyperlipidemia, and diabetes.  He has never been diagnosed with heart failure, but that is the likely etiology behind his bilateral lower extremity pitting edema and recent dyspnea on exertion.  Checking EKG in the office, will collect labs for BNP.  We discussed that he needs to call his cardiologist to get in sooner than his upcoming appointment on April 4.  He verbalized understanding.  In the meantime, starting Lasix 20 mg daily to reduce fluid overload.  We did discuss that this would likely increase his already frequent urination, but it is important to protect the heart.  Patient verbalized understanding and is agreeable to this plan.

## 2023-03-07 NOTE — Progress Notes (Signed)
Cardiology Clinic Note   Patient Name: [name] Date of Encounter: 03/09/2023  Primary Care Provider:  Velva Harman, PA Primary Cardiologist:  Sanda Klein, MD  Patient Profile     72 y.o. male with coronary artery disease, CABG 4 in 2009, prior inferior wall infarction, hypertension, hyperlipidemia, type 2 diabetes mellitus, venous insufficiency of the lower extremities, previous right frontal cortical infarct, PAD of lower extremities, asymptomatic atrial fibrillation detected by implantable loop recorder. Other history of ongoing tobacco abuse.  He wears compression stockings for peripheral venous insufficiency.  They do a reasonably good job of controlling his chronic edema.  Has paresthesias typical of diabetic neuropathy in both feet.   Before running out of battery, his loop recorder which showed infrequent episodes of paroxysmal atrial fibrillation (prevalence under 1%).  He has significant arterial disease in the left leg, ABI 0.77, occlusion of SFA, brief reconstitution and occlusion of mid popliteal and tibioperoneal trunk and peroneal, posterior tibial mid-calf reconstitution (duplex US 02/2019, unchanged in 02/2020).   Past Medical History    Past Medical History:  Diagnosis Date   Abdominal aortic aneurysm (AAA) (HCC)    3 cm per 07-02-18 US abdominal aorta US epic   Arthritis    Blood clot in vein    behind left knee   Chronic venous insufficiency LOWER EXTREMITIES   Coronary artery disease CARDIOLOGIST - DR  [id number]  LAST 1 WK AGO -- WILL REQUEST NOTE AND STRESS TEST   Diabetes mellitus without complication ([place])    type 2   Hematuria    last year   History of kidney stones    Hyperglycemia    Hyperlipidemia    Hypertension    Mixed dyslipidemia    Myocardial infarction (HCC)    Neuropathy    toes only   Nocturia    Prostate cancer ([name]) 08/18/2011   gleason 8, volume 24.4cc   S/P CABG x 4    ST elevation MI (STEMI) ([place]) 02/10/2008   S/P CABG    Stroke ([place]) 2016   occ trouble wriing with right hand   Urinary hesitancy    Vision abnormalities    resolved now   Wound discharge    since jan 2019 goes to wound center [place] left great toe small open area changes dressing q 2 days with ointment provided by wound center, clear drainage occ   Past Surgical History:  Procedure Laterality Date   CARDIAC CATHETERIZATION  05/01/2012   grafts widely patent   CARDIOVASCULAR STRESS TEST  03-15-2010   INFERIOR WALL SCAR WITHOUT ANY MEANINGFUL ISCHEMIA/ EF 46% / LOW RISK SCAN   CATARACT EXTRACTION     CORONARY ARTERY BYPASS GRAFT  02-10-2008  DR Einar Gip   X4 VESSEL DISEASE / EMERGANT CABG   CYSTOSCOPY  02/08/2012   Procedure: CYSTOSCOPY FLEXIBLE;  Surgeon: Franchot Gallo, MD;  Location: [facility];  Service: Urology;;   CYSTOSCOPY WITH RETROGRADE PYELOGRAM, URETEROSCOPY AND STENT PLACEMENT Right 09/12/2018   Procedure: CYSTOSCOPY WITH RETROGRADE PYELOGRAM, URETEROSCOPY AND STENT PLACEMENT, STONE EXTRACTION;  Surgeon: Franchot Gallo, MD;  Location: [facility];  Service: Urology;  Laterality: Right;   EP IMPLANTABLE DEVICE N/A 08/14/2016   Procedure: Loop Recorder Insertion;  Surgeon: Evans Lance, MD;  Location: [facility];  Service: Cardiovascular;  Laterality: N/A;   EXTRACORPOREAL SHOCK WAVE LITHOTRIPSY  2013   HOLMIUM LASER APPLICATION Right [id number]   Procedure: HOLMIUM LASER APPLICATION;  Surgeon: Franchot Gallo, MD;  Location: [facility];  Service: Urology;  Laterality: Right;   KNEE SURGERY  age 9   RIGHT   LEFT HEART CATHETERIZATION WITH CORONARY/GRAFT ANGIOGRAM N/A 05/01/2012   Procedure: LEFT HEART CATHETERIZATION WITH Beatrix Fetters;  Surgeon: Sanda Klein, MD;  Location: [facility];  Service: Cardiovascular;  Laterality: N/A;   MULTIPLE TEETH EXTRACTIONS (23)/ FOUR QUADRANT ALVEOLPLASTY/ MANDIBULAR LATERAL EXOSTOSES REDUCTIONS  03-24-2010   CHRONIC PERIODONTITIS   RADIOACTIVE SEED  IMPLANT  02/08/2012   Procedure: RADIOACTIVE SEED IMPLANT;  Surgeon: Franchot Gallo, MD;  Location: [facility];  Service: Urology;  Laterality: N/A;  C-ARM    SHOULDER SURGERY  1982   LEFT   TEE WITHOUT CARDIOVERSION N/A 08/14/2016   Procedure: TRANSESOPHAGEAL ECHOCARDIOGRAM (TEE);  Surgeon: Josue Hector, MD;  [facility];  Service: Cardiovascular;  Laterality: N/A;   TOTAL KNEE ARTHROPLASTY Right 11/27/2022   Procedure: TOTAL KNEE ARTHROPLASTY;  Surgeon: Vickey Huger, MD;  Location: [facility];  Service: Orthopedics;  Laterality: Right;   URETERAL STENT PLACEMENT      Allergies  Allergies  Allergen Reactions   Bee Venom Anaphylaxis   Penicillins Other (See Comments)    CAUSES "FREE BLEEDING"  Has patient had a PCN reaction causing immediate rash, facial/tongue/throat swelling, SOB or lightheadedness with hypotension: No  Has patient had a PCN reaction causing severe rash involving mucus membranes or skin necrosis: No  Has patient had a PCN reaction that required hospitalization No  Has patient had a PCN reaction occurring within the last 10 years: No  If all of the above answers are "NO", then may proceed with Cephalosporin use.  Other Reaction(s): Other (See Comments)  CAUSES "FREE BLEEDING", Has patient had a PCN reaction causing immediate rash, facial/tongue/throat swelling, SOB or lightheadedness with hypotension: No, Has patient had a PCN reaction causing severe rash involving mucus membranes or skin necrosis: No, Has patient had a PCN reaction that required hospitalization No, Has patient had a PCN reaction occurring within the last 10 years: No, If all of the above answers are "NO", then may proceed with Cephalosporin use.    History of Present Illness    [name] comes today with complaints of volume overload symptoms of abdominal distention, worsening lower extremity edema, mild orthopnea, NYHA class III symptoms.  He states he has gained 10  pounds over the last month.  He quit smoking just prior to having right knee surgery.  He has been snacking a lot on pretzels, cheese crackers, eating salted foods such as sausage, barbecue, and has also been more sedentary as he is recovered from surgery.  He did see his primary care who ordered labs with a BNP level of 322, and was placed on Lasix 20 mg daily.  He has had very little urine output from this and worsening lower extremity edema.  He did states that after taking the 20 mg of Lasix he lost 2 pounds, but has had no change in his abdominal distention or leg fullness.  He states he is unable to put on compression hose due to the size of his feet and calves.  Home Medications    Current Outpatient Medications  Medication Sig Dispense Refill   acetaminophen (TYLENOL) 500 MG tablet Take 1,000 mg by mouth every 8 (eight) hours as needed for moderate pain.     apixaban (ELIQUIS) 5 MG TABS tablet Take 1 tablet (5 mg total) by mouth 2 (two) times daily. 180 tablet 1   atorvastatin (LIPITOR) 10 MG tablet  Take 1 tablet (10 mg total) by mouth daily. 90 tablet 1   blood glucose meter kit and supplies KIT Dispense based on patient and insurance preference. 1 each 0   Continuous Blood Gluc Receiver (FREESTYLE LIBRE 14 DAY READER) DEVI 1 Device by Does not apply route daily. Use to check blood sugars every morning and 2 hours after largest meals 1 each 1   Continuous Blood Gluc Sensor (FREESTYLE LIBRE 14 DAY SENSOR) MISC USE AS DIRECTED 6 each 0   dextromethorphan-guaiFENesin (MUCINEX DM) 30-600 MG 12hr tablet Take 1 tablet by mouth 2 (two) times daily. 20 tablet 0   furosemide (LASIX) 20 MG tablet Take 1 tablet (20 mg total) by mouth daily. 30 tablet 3   glipiZIDE (GLUCOTROL) 5 MG tablet Take 1 tablet (5 mg total) by mouth 2 (two) times daily before a meal. 90 tablet 0   glucose blood (FREESTYLE LITE) test strip Use to check blood sugars every morning fasting 100 each 12   lisinopril (ZESTRIL) 5 MG  tablet Take 1 tablet (5 mg total) by mouth daily. 90 tablet 1   mirabegron ER (MYRBETRIQ) 50 MG TB24 tablet Take 1 tablet (50 mg total) by mouth daily. 30 tablet 11   Multiple Vitamin (MULTIVITAMIN WITH MINERALS) TABS tablet Take 1 tablet by mouth daily. Men's One-A-Day Multivitamin     omeprazole (PRILOSEC) 20 MG capsule Take 1 capsule (20 mg total) by mouth daily. For indigestion and reflux. 30 capsule 0   pregabalin (LYRICA) 50 MG capsule Take 1 capsule (50 mg total) by mouth 2 (two) times daily. 60 capsule 2   Semaglutide 7 MG TABS Take 1 tablet (7 mg total) by mouth daily. 30 tablet 0   tamsulosin (FLOMAX) 0.4 MG CAPS capsule Take 1 capsule (0.4 mg total) by mouth at bedtime. 90 capsule 1   No current facility-administered medications for this visit.     Family History    Family History  Problem Relation Age of Onset   Cancer Father        pancreatic   Diabetes Father    He indicated that his mother is alive. He indicated that his father is deceased.  Social History    Social History   Socioeconomic History   Marital status: Married    Spouse name: Not on file   Number of children: Not on file   Years of education: Not on file   Highest education level: Not on file  Occupational History   Not on file  Tobacco Use   Smoking status: Former    Packs/day: 0.50    Years: 50.00    Additional pack years: 0.00    Total pack years: 25.00    Types: Cigarettes    Passive exposure: Never   Smokeless tobacco: Never   Tobacco comments:    PREVIOUSLY SMOKED 3PPD / DECREASED TO 1PPD SINCE 2009  Vaping Use   Vaping Use: Never used  Substance and Sexual Activity   Alcohol use: Not Currently    Alcohol/week: 2.0 standard drinks of alcohol    Types: 1 Cans of beer, 1 Shots of liquor per week   Drug use: No   Sexual activity: Not Currently  Other Topics Concern   Not on file  Social History Narrative   Not on file   Social Determinants of Health   Financial Resource Strain:  Not on file  Food Insecurity: No Food Insecurity (11/27/2022)   Hunger Vital Sign    Worried About Running Out  of Food in the Last Year: Never true    [place] in the Last Year: Never true  Transportation Needs: No Transportation Needs (11/27/2022)   PRAPARE - Hydrologist (Medical): No    Lack of Transportation (Non-Medical): No  Physical Activity: Not on file  Stress: Not on file  Social Connections: Not on file  Intimate Partner Violence: Not At Risk (11/27/2022)   Humiliation, Afraid, Rape, and Kick questionnaire    Fear of Current or Ex-Partner: No    Emotionally Abused: No    Physically Abused: No    Sexually Abused: No     Review of Systems    General:  No chills, fever, night sweats or weight changes.  Cardiovascular:  No chest pain, dyspnea on exertion, worsening edema, mild orthopnea, palpitations, paroxysmal nocturnal dyspnea. Dermatological: No rash, lesions/masses Respiratory: No cough, dyspnea Urologic: No hematuria, dysuria Abdominal:   No nausea, vomiting, diarrhea, bright red blood per rectum, melena, or hematemesis Neurologic:  No visual changes, wkns, changes in mental status. All other systems reviewed and are otherwise negative except as noted above.     Physical Exam    VS:  BP 118/64   Pulse (!) 111   Ht 5\' 10"  (1.778 m)   Wt 239 lb (108.4 kg)   SpO2 94%   BMI 34.29 kg/m  , BMI Body mass index is 34.29 kg/m.     GEN: Well nourished, well developed, in no acute distress. HEENT: normal. Neck: Supple, no JVD, carotid bruits, or masses. Cardiac: IRRR, no murmurs, rubs, or gallops. No clubbing, cyanosis, 2+ pretibial to 3+ pretibial edema.,  Shiny tight skin.  Radials/DP/PT 2+ and equal bilaterally.  Respiratory:  Respirations regular and unlabored, clear to auscultation bilaterally. GI: Soft, nontender, distended, BS + x 4. MS: no deformity or atrophy. Skin: warm and dry, no rash.  Taut tight shiny pretibial skin  over edematous legs below the knee bilaterally Neuro:  Strength and sensation are intact. Psych: Normal affect.  Accessory Clinical Findings    ECG personally reviewed by me today-atrial fibrillation with RVR heart rate 111 bpm with right bundle branch block evidence of inferior Q waves.- No acute changes  Lab Results  Component Value Date   WBC 9.8 11/27/2022   HGB 13.4 11/27/2022   HCT 41.9 11/27/2022   MCV 91.3 11/27/2022   PLT 168 11/27/2022   Lab Results  Component Value Date   CREATININE 0.74 11/27/2022   BUN 17 11/27/2022   NA 137 11/27/2022   K 5.0 11/27/2022   CL 104 11/27/2022   CO2 25 11/27/2022   Lab Results  Component Value Date   ALT 23 11/27/2022   AST 14 (L) 11/27/2022   ALKPHOS 92 11/27/2022   BILITOT 0.5 11/27/2022   Lab Results  Component Value Date   CHOL 77 (L) 03/21/2022   HDL 28 (L) 03/21/2022   LDLCALC 31 03/21/2022   TRIG 87 03/21/2022   CHOLHDL 2.8 03/21/2022    Lab Results  Component Value Date   HGBA1C 11.8 03/07/2023    Review of Prior Studies: ABI 03/28/2022      Today ABIToday TBIPrevious ABIPrevious TBI  +-------+-----------+-----------+------------+------------+  Right  1.11       0.90       1.13        0.97          +-------+-----------+-----------+------------+------------+  Left   0.72       0.33  0.72        0.39          +-------+-----------+-----------+------------+------------+        Labs/Other Tests and Data Reviewed:     ABI 03/28/2022 +-------+-----------+-----------+------------+------------+  ABI/TBIToday's ABIToday's TBIPrevious ABIPrevious TBI  +-------+-----------+-----------+------------+------------+  Right  1.11       0.90       1.13        0.97          +-------+-----------+-----------+------------+------------+  Left   0.72       0.33       0.72        0.39          +-------+-----------+-----------+------------+------------+   Bilateral ABIs and TBIs appear  essentially unchanged compared to prior study on 03/28/21.     Summary:  Right: Resting right ankle-brachial index is within normal range. No evidence of significant right lower extremity arterial disease. The right toe-brachial index is normal.   Left: Resting left ankle-brachial index indicates moderate left lower extremity arterial disease. The left toe-brachial index is abnormal.   Assessment & Plan   1.  Acute on chronic combined systolic diastolic heart failure: 10 pound weight gain related to diet indiscretion eating a lot of salty foods and snack foods after stopping smoking.  He had been placed on Lasix 20 mg by PCP and has had a 2 pound weight loss but worsening lower extremity edema and abdominal distention.  I have placed a Furosix infusion on his abdomen as he may not have good bioavailability of p.o. Lasix which she started yesterday.  He states he has not had any significant urine output.  I want him to come back in 3 days to have a be met.  He will continue the Lasix 20 mg daily.  He is advised if swelling worsens, breathing worsens, he should report to ED where he may need IV diuretic.  I will see him in a week to evaluate his response to medication.  He should weigh daily and avoid salty foods.  I am going to repeat his echocardiogram as the last echo was done in 2017 to evaluate for changes in LV function.  2.  Paroxysmal atrial fibrillation: Today he is in atrial fibrillation and his heart rate is elevated per EKG, apically he has come down to 88 bpm.  Continue apixaban 5 mg twice daily for CVA prophylaxis.  Doubt PE causing tachycardia or heart failure currently.  On follow-up may need to consider heart rate control medications as he is not on this currently.  3.  CAD: History of coronary artery bypass grafting.  Denies any chest discomfort.  Does have some mild shortness of breath related to his volume overload.  May need to consider reevaluation for ischemia in this diabetic patient  if echocardiogram reveals decreased EF, even in the setting of PAF.Marland Kitchen  4.  Chronic bilateral venous insufficiency with lower extremity edema: This is out of proportion to his normal dependent edema.  ABI on 03/28/2022 revealed reduced flow in the left is 0.39.  They were found to be essentially unchanged.  He will need to be refitted for support hose once he is diuresed.  5.  Hypercholesterolemia: Continue atorvastatin 10 mg daily.  The patient will need to have follow-up labs either by PCP or cardiology.  Most recent lipid profile was on 03/21/2022 with a total cholesterol of 77 HDL 20 triglycerides 87 LDL 31.  Current medicines are reviewed at length with the patient today.  I have spent 45 min's  dedicated to the care of this patient on the date of this encounter to include pre-visit review of records, assessment, management and diagnostic testing,with shared decision making. Signed, Phill Myron. West Pugh, ANP, [place]   03/09/2023 5:09 PM      Office [phone] Fax [phone]  Notice: This dictation was prepared with Dragon dictation along with smaller phrase technology. Any transcriptional errors that result from this process are unintentional and may not be corrected upon review.

## 2023-03-07 NOTE — Telephone Encounter (Signed)
He seems to weigh around 10 lb more than his weight before knee surgery. That may be extra fluid. Please have him come in for a BNP and BMET this week. That may help [place] recommend more aggressive diuresis.

## 2023-03-07 NOTE — Assessment & Plan Note (Signed)
A1c today 11.8, patient was not surprised given that he has not been taking the metformin and his sugars at home have been running very high.  We discussed options to add on for blood sugar management to get his A1c back to his goal.  Metformin was not well-tolerated, SGLT2's are not a good option for this patient given that he already has such frequent urination, and the patient has an aversion to needles so we need to avoid injectable medications.  He is however open to adding a pill to his regimen, so we discussed starting Rybelsus as this will provide very good glucose control.  Patient verbalized understanding and is agreeable.  Provided a sample of the first month at the starting dose in office and sent in prescription for his second month.  For insurance purposes: Patient is already taking glipizide twice daily. Patient failed metformin as it caused intolerable diarrhea that kept him on the toilet for much of the day. TZDs like pioglitazone should be avoided given his new heart failure. SGLT2 medications also need to be avoided given that he already experiences polyuria secondary to causes unrelated to diabetes. Patient has an aversion to needles and needs to avoid injectable medications.   This leaves Rybelsus as the best option to get his glucose levels under control.

## 2023-03-07 NOTE — Assessment & Plan Note (Signed)
Continue atorvastatin 10 mg.  Will continue to monitor, encouraged heart healthy diet low in saturated and trans fats.

## 2023-03-07 NOTE — Assessment & Plan Note (Signed)
Stable.  Continue lisinopril 5 mg daily, regimen may change after heart failure evaluation by cardiology.  Will continue to monitor.

## 2023-03-07 NOTE — Progress Notes (Addendum)
Established Patient Office Visit  Subjective   Patient ID: [name], male    DOB: Oct 06, 1951  Age: 72 y.o. MRN: [id number]  Chief Complaint  Patient presents with   Medical Management of Chronic Issues   Diabetes   Leg Swelling    Right    HPI [name] is a 72 y.o. male presenting today for follow up of diabetes mellitus. Diabetes: denies hypoglycemic events, wounds or sores that are not healing well, increased thirst or urination though his baseline is urinating about every hour, but currently on mirabegron and tamsulosin for urinary symptoms.  Patient has had increasing sugars and A1c since his total knee arthroscopy at the beginning of December, his last appointment at the end of January Heather recommended adding metformin 500 mg daily.  He tried this for a while, but discontinued it as it caused such bad diarrhea that he felt that he was living on the toilet.  He has continued with his glipizide 5 mg twice a day, but he ran out of that early last week.  Checking glucose at home, sugars have been very high often in the 300s. Patient has had a 10 pound increase over the past 6 weeks since his last visit.  He also endorses some dyspnea on exertion that has been worsening over the past month, mild chest pain occasionally.  He has some swelling of both legs that his knee surgeon noticed at his follow-up appointment 5 days ago and suggested that he mention it to primary care.  ROS Negative unless otherwise noted in HPI   Objective:     BP 137/88 (BP Location: Right Arm, Patient Position: Sitting, Cuff Size: Large)   Pulse 75   Resp 18   Ht '5\' 10"'$  (1.778 m)   Wt 238 lb (108 kg)   SpO2 95%   BMI 34.15 kg/m   Physical Exam Constitutional:      General: He is not in acute distress.    Appearance: Normal appearance.  HENT:     Head: Normocephalic and atraumatic.  Eyes:     Extraocular Movements: Extraocular movements intact.     Conjunctiva/sclera: Conjunctivae  normal.  Cardiovascular:     Rate and Rhythm: Normal rate and regular rhythm.     Pulses: Normal pulses.     Heart sounds: Normal heart sounds.  Pulmonary:     Effort: Pulmonary effort is normal.     Breath sounds: Normal breath sounds.  Musculoskeletal:     Right lower leg: Edema (3+ pitting edema) present.     Left lower leg: Edema (2+ pitting edema) present.  Skin:    General: Skin is warm and dry.  Neurological:     Mental Status: He is alert and oriented to person, place, and time.  Psychiatric:        Mood and Affect: Mood normal.    Results for orders placed or performed in visit on 03/07/23  HM DIABETES EYE EXAM  Result Value Ref Range   HM Diabetic Eye Exam No Retinopathy No Retinopathy  Results for orders placed or performed in visit on 03/07/23  POCT glycosylated hemoglobin (Hb A1C)  Result Value Ref Range   Hemoglobin A1C     HbA1c POC (<> result, manual entry) 11.8 4.0 - 5.6 %   HbA1c, POC (prediabetic range)     HbA1c, POC (controlled diabetic range)      Assessment & Plan:  Hypertension associated with diabetes ([place]) Assessment & Plan: Stable.  Continue lisinopril 5 mg daily, regimen may change after heart failure evaluation by cardiology.  Will continue to monitor.  Orders: -     Brain natriuretic peptide -     Lisinopril; Take 1 tablet (5 mg total) by mouth daily.  Dispense: 90 tablet; Refill: 1  Diabetes mellitus type 2 in obese [facility]) Assessment & Plan: A1c today 11.8, patient was not surprised given that he has not been taking the metformin and his sugars at home have been running very high.  We discussed options to add on for blood sugar management to get his A1c back to his goal.  Metformin was not well-tolerated, SGLT2's are not a good option for this patient given that he already has such frequent urination, and the patient has an aversion to needles so we need to avoid injectable medications.  He is however open to adding a pill to his regimen, so we  discussed starting Rybelsus as this will provide very good glucose control.  Patient verbalized understanding and is agreeable.  Provided a sample of the first month at the starting dose in office and sent in prescription for his second month.  For insurance purposes: Patient is already taking glipizide twice daily. Patient failed metformin as it caused intolerable diarrhea that kept him on the toilet for much of the day. TZDs like pioglitazone should be avoided given his new heart failure. SGLT2 medications also need to be avoided given that he already experiences polyuria secondary to causes unrelated to diabetes. Patient has an aversion to needles and needs to avoid injectable medications.   This leaves Rybelsus as the best option to get his glucose levels under control.  Orders: -     POCT glycosylated hemoglobin (Hb A1C) -     Brain natriuretic peptide -     Semaglutide; Take 1 tablet (7 mg total) by mouth daily.  Dispense: 30 tablet; Refill: 0 -     glipiZIDE; Take 1 tablet (5 mg total) by mouth 2 (two) times daily before a meal.  Dispense: 90 tablet; Refill: 0 -     Pregabalin; Take 1 capsule (50 mg total) by mouth 2 (two) times daily.  Dispense: 60 capsule; Refill: 2  Mixed diabetic hyperlipidemia associated with type 2 diabetes mellitus ([place]) Assessment & Plan: Continue atorvastatin 10 mg.  Will continue to monitor, encouraged heart healthy diet low in saturated and trans fats.  Orders: -     Brain natriuretic peptide -     Atorvastatin Calcium; Take 1 tablet (10 mg total) by mouth daily.  Dispense: 90 tablet; Refill: 1  Type 2 diabetes mellitus with hyperglycemia, without long-term current use of insulin ([place]) -     Semaglutide; Take 1 tablet (7 mg total) by mouth daily.  Dispense: 30 tablet; Refill: 0  Microalbuminuria due to type 2 diabetes mellitus (HCC) -     Lisinopril; Take 1 tablet (5 mg total) by mouth daily.  Dispense: 90 tablet; Refill: 1  type 2 diabetes with  peripheral neuropathy Assessment & Plan: A1c today 11.8, patient was not surprised given that he has not been taking the metformin and his sugars at home have been running very high.  We discussed options to add on for blood sugar management to get his A1c back to his goal.  Metformin was not well-tolerated, SGLT2's are not a good option for this patient given that he already has such frequent urination, and the patient has an aversion to needles so we need to avoid injectable  medications.  He is however open to adding a pill to his regimen, so we discussed starting Rybelsus as this will provide very good glucose control.  Patient verbalized understanding and is agreeable.  Provided a sample of the first month at the starting dose in office and sent in prescription for his second month.  For insurance purposes: Patient is already taking glipizide twice daily. Patient failed metformin as it caused intolerable diarrhea that kept him on the toilet for much of the day. TZDs like pioglitazone should be avoided given his new heart failure. SGLT2 medications also need to be avoided given that he already experiences polyuria secondary to causes unrelated to diabetes. Patient has an aversion to needles and needs to avoid injectable medications.   This leaves Rybelsus as the best option to get his glucose levels under control.  Orders: -     POCT glycosylated hemoglobin (Hb A1C) -     Brain natriuretic peptide -     Semaglutide; Take 1 tablet (7 mg total) by mouth daily.  Dispense: 30 tablet; Refill: 0 -     glipiZIDE; Take 1 tablet (5 mg total) by mouth 2 (two) times daily before a meal.  Dispense: 90 tablet; Refill: 0 -     Pregabalin; Take 1 capsule (50 mg total) by mouth 2 (two) times daily.  Dispense: 60 capsule; Refill: 2  Increased urinary frequency -     Tamsulosin HCl; Take 1 capsule (0.4 mg total) by mouth at bedtime.  Dispense: 90 capsule; Refill: 1  DOE (dyspnea on exertion) -     Brain  natriuretic peptide -     Furosemide; Take 1 tablet (20 mg total) by mouth daily.  Dispense: 30 tablet; Refill: 3 -     EKG 12-Lead  Bilateral edema of lower extremity -     Brain natriuretic peptide -     Furosemide; Take 1 tablet (20 mg total) by mouth daily.  Dispense: 30 tablet; Refill: 3 -     EKG 12-Lead  Hypertensive heart disease with heart failure (HCC) Assessment & Plan: No prior history of heart failure, but patient does have multiple cardiovascular risk factors including history of MI, CAD, stroke, bilateral LE venous insufficiency/PAD, hypertension, hyperlipidemia, and diabetes.  He has never been diagnosed with heart failure, but that is the likely etiology behind his bilateral lower extremity pitting edema and recent dyspnea on exertion.  Checking EKG in the office, will collect labs for BNP.  We discussed that he needs to call his cardiologist to get in sooner than his upcoming appointment on April 4.  He verbalized understanding.  In the meantime, starting Lasix 20 mg daily to reduce fluid overload.  We did discuss that this would likely increase his already frequent urination, but it is important to protect the heart.  Patient verbalized understanding and is agreeable to this plan.  Orders: -     Brain natriuretic peptide -     EKG 12-Lead  Patient verbalized understanding of the plan and is calling his cardiologist to schedule an appointment with them as soon as he leaves our office today.  Medication Samples have been provided to the patient. Drug name: Rybelsus       Strength: '3mg'$         Qty: 30     Dosing instructions: Take 1 tablet daily for diabetes mellitus.  The patient has been instructed regarding the correct time, dose, and frequency of taking this medication, including desired effects and most common  side effects.   Velva Harman 5:01 PM 03/07/2023  I spent 50 minutes on the day of the encounter to include pre-visit record review, face-to-face time with the  patient and post visit ordering of test.  Return in about 6 weeks (around 04/18/2023) for follow-up.    Velva Harman, PA

## 2023-03-07 NOTE — Telephone Encounter (Signed)
Called patient, he had BNP completed at PCP this morning. Patients appointment was moved to Friday due to conflict scheduling. Patient will now see Jory Sims, NP- spoke with Dr.Croitoru who advised it was okay to wait until Friday for a BMET and any other labs. Patient aware of this as well.  Will route to NP seeing patient to make aware.   Thanks!

## 2023-03-07 NOTE — Telephone Encounter (Signed)
Received call into triage, patient is having swelling/SOB. Patient seen his PCP today (notes in epic) advised for him to reach out to Cardiologist for a follow up appointment. They placed him on Lasix 20 mg daily (patient will pick this up and start taking it). Advised patient to start Lasix as prescribed by PCP, check daily weights and keep a log, continue to elevate legs, and use compression stockings, monitor SOB if improved with the start of Lasix. Patient verbalized understanding. Patient will also check BP/HR at home as well. BP/HR wnl at PCP visit. Diet discussion was also advised, and patient will monitor what he eats.   Appointment made for 1 week from today with NP(03/20 at 2:45 PM). Will route to primary MD/NP seeing patient for upcoming visit. Patient aware to call back if any other concerns.

## 2023-03-07 NOTE — Patient Instructions (Signed)
Start taking the Lasix each day to get some of the fluid off of your heart and legs.  It will increase how frequently you are going to the bathroom, but we do need to protect your heart.  Continue this until you see your cardiologist again.  They will need to do an echocardiogram.  Start the sample of Rybelsus for 1 month.  I have sent in the next month supply to your pharmacy and we will need to get that approved with the prior authorization.  The 1 month sample should give [place] enough time to go through all of that paperwork.  Continue your glipizide twice a day and checking your sugars at home.

## 2023-03-07 NOTE — Telephone Encounter (Signed)
Pt c/o swelling: STAT is pt has developed SOB within 24 hours  How much weight have you gained and in what time span? 10 lbs in 2 weeks  If swelling, where is the swelling located? legs  Are you currently taking a fluid pill? yes  Are you currently SOB? yes  Do you have a log of your daily weights (if so, list)? 238 today  Have you gained 3 pounds in a day or 5 pounds in a week? yes  Have you traveled recently? No

## 2023-03-08 ENCOUNTER — Encounter: Payer: Self-pay | Admitting: Family Medicine

## 2023-03-08 LAB — BRAIN NATRIURETIC PEPTIDE: BNP: 322.5 pg/mL — ABNORMAL HIGH (ref 0.0–100.0)

## 2023-03-09 ENCOUNTER — Ambulatory Visit: Payer: PPO | Attending: Nurse Practitioner | Admitting: Adult Health

## 2023-03-09 ENCOUNTER — Encounter: Payer: Self-pay | Admitting: Adult Health

## 2023-03-09 VITALS — BP 118/64 | HR 111 | Ht 70.0 in | Wt 239.0 lb

## 2023-03-09 DIAGNOSIS — R609 Edema, unspecified: Secondary | ICD-10-CM

## 2023-03-09 DIAGNOSIS — I2583 Coronary atherosclerosis due to lipid rich plaque: Secondary | ICD-10-CM

## 2023-03-09 DIAGNOSIS — I739 Peripheral vascular disease, unspecified: Secondary | ICD-10-CM

## 2023-03-09 DIAGNOSIS — I5020 Unspecified systolic (congestive) heart failure: Secondary | ICD-10-CM

## 2023-03-09 DIAGNOSIS — I10 Essential (primary) hypertension: Secondary | ICD-10-CM

## 2023-03-09 DIAGNOSIS — I480 Paroxysmal atrial fibrillation: Secondary | ICD-10-CM | POA: Diagnosis not present

## 2023-03-09 DIAGNOSIS — E7800 Pure hypercholesterolemia, unspecified: Secondary | ICD-10-CM | POA: Diagnosis not present

## 2023-03-09 DIAGNOSIS — I2510 Atherosclerotic heart disease of native coronary artery without angina pectoris: Secondary | ICD-10-CM | POA: Diagnosis not present

## 2023-03-09 NOTE — Patient Instructions (Signed)
Medication Instructions:  No Changes *If you need a refill on your cardiac medications before your next appointment, please call your pharmacy*   Lab Work: BMET, BNP If you have labs (blood work) drawn today and your tests are completely normal, you will receive your results only by: [place] (if you have MyChart) OR A paper copy in the mail If you have any lab test that is abnormal or we need to change your treatment, we will call you to review the results.   Testing/Procedures: [address]. Your physician has requested that you have an echocardiogram. Echocardiography is a painless test that uses sound waves to create images of your heart. It provides your doctor with information about the size and shape of your heart and how well your heart's chambers and valves are working. This procedure takes approximately one hour. There are no restrictions for this procedure. Please do NOT wear cologne, perfume, aftershave, or lotions (deodorant is allowed). Please arrive 15 minutes prior to your appointment time.    Follow-Up: At [facility], you and your health needs are our priority.  As part of our continuing mission to provide you with exceptional heart care, we have created designated Provider Care Teams.  These Care Teams include your primary Cardiologist (physician) and Advanced Practice Providers (APPs -  Physician Assistants and Nurse Practitioners) who all work together to provide you with the care you need, when you need it.  We recommend signing up for the patient portal called "MyChart".  Sign up information is provided on this After Visit Summary.  MyChart is used to connect with patients for Virtual Visits (Telemedicine).  Patients are able to view lab/test results, encounter notes, upcoming appointments, etc.  Non-urgent messages can be sent to your provider as well.   To learn more about what you can do with MyChart, go to  [email or web address].    Your next appointment:   1 week(s)  Provider:   Jory Sims, DNP, ANP     Other Instructions If Swelling continue and dose not improve go to Emergency Room.

## 2023-03-11 ENCOUNTER — Telehealth: Payer: Self-pay | Admitting: Physician Assistant

## 2023-03-11 NOTE — Telephone Encounter (Signed)
72 yo male seen in the office Friday with volume overload not responding to Lasix 20 once daily. He was placed on Furoscix x 1 and then resume Lasix 20 yesterday. He notes good diuresis Friday. Weight is down 4 lbs. He notes continued dyspnea on exertion, leg edema. He has not had orthopnea, chest pain. He has not noticed much UOP with Lasix 20. He has f/u BMET planned tomorrow. PLAN: -Increase Lasix to 40 mg today -Get f/u BMET tomorrow as planned. -Will fwd to Jory Sims, NP for further adjustments in Lasix based upon lab work. Richardson Dopp, PA-C    03/11/2023 10:47 AM

## 2023-03-12 DIAGNOSIS — I5020 Unspecified systolic (congestive) heart failure: Secondary | ICD-10-CM | POA: Diagnosis not present

## 2023-03-13 LAB — BASIC METABOLIC PANEL
BUN/Creatinine Ratio: 10 (ref 10–24)
BUN: 10 mg/dL (ref 8–27)
CO2: 26 mmol/L (ref 20–29)
Calcium: 9.6 mg/dL (ref 8.6–10.2)
Chloride: 101 mmol/L (ref 96–106)
Creatinine, Ser: 1 mg/dL (ref 0.76–1.27)
Glucose: 86 mg/dL (ref 70–99)
Potassium: 3.8 mmol/L (ref 3.5–5.2)
Sodium: 140 mmol/L (ref 134–144)
eGFR: 80 mL/min/{1.73_m2} (ref >59)

## 2023-03-13 LAB — BRAIN NATRIURETIC PEPTIDE: BNP: 289.6 pg/mL — ABNORMAL HIGH (ref 0.0–100.0)

## 2023-03-14 ENCOUNTER — Ambulatory Visit: Payer: PPO | Admitting: Nurse Practitioner

## 2023-03-15 NOTE — Progress Notes (Signed)
Cardiology Clinic Note   Patient Name: [name] Date of Encounter: 03/16/2023  Primary Care Provider:  Velva Harman, PA Primary Cardiologist:  Sanda Klein, MD  Patient Profile    72 y.o. male with coronary artery disease, CABG 4 in 2009, prior inferior wall infarction, hypertension, hyperlipidemia, type 2 diabetes mellitus, venous insufficiency of the lower extremities, previous right frontal cortical infarct, PAD of lower extremities, asymptomatic atrial fibrillation detected by implantable loop recorder. Other history of ongoing tobacco abuse.  He wears compression stockings for peripheral venous insufficiency.  They do a reasonably good job of controlling his chronic edema.  Has paresthesias typical of diabetic neuropathy in both feet.   Before running out of battery, his loop recorder which showed infrequent episodes of paroxysmal atrial fibrillation (prevalence under 1%).  He has significant arterial disease in the left leg, ABI 0.77, occlusion of SFA, brief reconstitution and occlusion of mid popliteal and tibioperoneal trunk and peroneal, posterior tibial mid-calf reconstitution (duplex US 02/2019, unchanged in 02/2020). On last visit 03/09/2023 he was found to be volume overloaded and Furosix infusion was placed on abdomen.   Past Medical History    Past Medical History:  Diagnosis Date   Abdominal aortic aneurysm (AAA) (HCC)    3 cm per 07-02-18 US abdominal aorta US epic   Arthritis    Blood clot in vein    behind left knee   Chronic venous insufficiency LOWER EXTREMITIES   Coronary artery disease CARDIOLOGIST - DR  [id number]  LAST 1 WK AGO -- WILL REQUEST NOTE AND STRESS TEST   Diabetes mellitus without complication ([place])    type 2   Hematuria    last year   History of kidney stones    Hyperglycemia    Hyperlipidemia    Hypertension    Mixed dyslipidemia    Myocardial infarction (HCC)    Neuropathy    toes only   Nocturia    Prostate cancer ([place])  08/18/2011   gleason 8, volume 24.4cc   S/P CABG x 4    ST elevation MI (STEMI) ([place]) 02/10/2008   S/P CABG   Stroke ([place]) 2016   occ trouble wriing with right hand   Urinary hesitancy    Vision abnormalities    resolved now   Wound discharge    since jan 2019 goes to wound center [place] left great toe small open area changes dressing q 2 days with ointment provided by wound center, clear drainage occ   Past Surgical History:  Procedure Laterality Date   CARDIAC CATHETERIZATION  05/01/2012   grafts widely patent   CARDIOVASCULAR STRESS TEST  03-15-2010   INFERIOR WALL SCAR WITHOUT ANY MEANINGFUL ISCHEMIA/ EF 46% / LOW RISK SCAN   CATARACT EXTRACTION     CORONARY ARTERY BYPASS GRAFT  02-10-2008  DR Einar Gip   X4 VESSEL DISEASE / EMERGANT CABG   CYSTOSCOPY  02/08/2012   Procedure: CYSTOSCOPY FLEXIBLE;  Surgeon: Franchot Gallo, MD;  Location[facility];  Service: Urology;;   CYSTOSCOPY WITH RETROGRADE PYELOGRAM, URETEROSCOPY AND STENT PLACEMENT Right 09/12/2018   Procedure: CYSTOSCOPY WITH RETROGRADE PYELOGRAM, URETEROSCOPY AND STENT PLACEMENT, STONE EXTRACTION;  Surgeon: Franchot Gallo, MD;  Location: [facility];  Service: Urology;  Laterality: Right;   EP IMPLANTABLE DEVICE N/A 08/14/2016   Procedure: Loop Recorder Insertion;  Surgeon: Evans Lance, MD;  Location: [facility];  Service: Cardiovascular;  Laterality: N/A;   EXTRACORPOREAL SHOCK WAVE LITHOTRIPSY  2013   HOLMIUM LASER  APPLICATION Right [id number]   Procedure: HOLMIUM LASER APPLICATION;  Surgeon: Franchot Gallo, MD;  Location: [facility];  Service: Urology;  Laterality: Right;   KNEE SURGERY  age 74   RIGHT   LEFT HEART CATHETERIZATION WITH CORONARY/GRAFT ANGIOGRAM N/A 05/01/2012   Procedure: LEFT HEART CATHETERIZATION WITH Beatrix Fetters;  Surgeon: Sanda Klein, MD;  Location: [facility];  Service: Cardiovascular;  Laterality: N/A;   MULTIPLE TEETH EXTRACTIONS (23)/ FOUR QUADRANT  ALVEOLPLASTY/ MANDIBULAR LATERAL EXOSTOSES REDUCTIONS  03-24-2010   CHRONIC PERIODONTITIS   RADIOACTIVE SEED IMPLANT  02/08/2012   Procedure: RADIOACTIVE SEED IMPLANT;  Surgeon: Franchot Gallo, MD;  Location: [facility];  Service: Urology;  Laterality: N/A;  C-ARM    SHOULDER SURGERY  1982   LEFT   TEE WITHOUT CARDIOVERSION N/A 08/14/2016   Procedure: TRANSESOPHAGEAL ECHOCARDIOGRAM (TEE);  Surgeon: Josue Hector, MD;  Location: [facility];  Service: Cardiovascular;  Laterality: N/A;   TOTAL KNEE ARTHROPLASTY Right 11/27/2022   Procedure: TOTAL KNEE ARTHROPLASTY;  Surgeon: Vickey Huger, MD;  Location: [facility];  Service: Orthopedics;  Laterality: Right;   URETERAL STENT PLACEMENT      Allergies  Allergies  Allergen Reactions   Bee Venom Anaphylaxis   Penicillins Other (See Comments)    CAUSES "FREE BLEEDING"  Has patient had a PCN reaction causing immediate rash, facial/tongue/throat swelling, SOB or lightheadedness with hypotension: No  Has patient had a PCN reaction causing severe rash involving mucus membranes or skin necrosis: No  Has patient had a PCN reaction that required hospitalization No  Has patient had a PCN reaction occurring within the last 10 years: No  If all of the above answers are "NO", then may proceed with Cephalosporin use.  Other Reaction(s): Other (See Comments)  CAUSES "FREE BLEEDING", Has patient had a PCN reaction causing immediate rash, facial/tongue/throat swelling, SOB or lightheadedness with hypotension: No, Has patient had a PCN reaction causing severe rash involving mucus membranes or skin necrosis: No, Has patient had a PCN reaction that required hospitalization No, Has patient had a PCN reaction occurring within the last 10 years: No, If all of the above answers are "NO", then may proceed with Cephalosporin use.    History of Present Illness    Mr. Pfahl returns for follow-up after given Furosix subcutaneous infusion on last  office visit with titration of Lasix from 20 mg to 40 mg on follow-up phone call over the weekend, ordered by Richardson Dopp, PA on-call.  The patient has done well with diuresis and has lost approximately 11 pounds of fluid.  He continues on 40 mg of Lasix daily.  I am concerned that his blood pressure is a little soft but he denies any dizziness, or lightheadedness.  He he is staying hydrated and doing his best to avoid salt although on occasion he does eat out.  Follow-up labs have been reviewed.  He states he feels much better and can breathe better and the lower extremity edema has improved significantly allowing him to wear his compression hose easily.  Home Medications    Current Outpatient Medications  Medication Sig Dispense Refill   acetaminophen (TYLENOL) 500 MG tablet Take 1,000 mg by mouth every 8 (eight) hours as needed for moderate pain.     apixaban (ELIQUIS) 5 MG TABS tablet Take 1 tablet (5 mg total) by mouth 2 (two) times daily. 180 tablet 1   apixaban (ELIQUIS) 5 MG TABS tablet Take 1 tablet (5 mg total) by mouth 2 (two) times  daily. 28 tablet 0   atorvastatin (LIPITOR) 10 MG tablet Take 1 tablet (10 mg total) by mouth daily. 90 tablet 1   blood glucose meter kit and supplies KIT Dispense based on patient and insurance preference. 1 each 0   Continuous Blood Gluc Receiver (FREESTYLE LIBRE 14 DAY READER) DEVI 1 Device by Does not apply route daily. Use to check blood sugars every morning and 2 hours after largest meals 1 each 1   Continuous Blood Gluc Sensor (FREESTYLE LIBRE 14 DAY SENSOR) MISC USE AS DIRECTED 6 each 0   dextromethorphan-guaiFENesin (MUCINEX DM) 30-600 MG 12hr tablet Take 1 tablet by mouth 2 (two) times daily. 20 tablet 0   furosemide (LASIX) 20 MG tablet Take 1 tablet (20 mg total) by mouth daily. (Patient taking differently: Take 2 tablets by mouth daily. 2 tabs(40mg )) 30 tablet 3   glipiZIDE (GLUCOTROL) 5 MG tablet Take 1 tablet (5 mg total) by mouth 2 (two) times  daily before a meal. 90 tablet 0   glucose blood (FREESTYLE LITE) test strip Use to check blood sugars every morning fasting 100 each 12   mirabegron ER (MYRBETRIQ) 50 MG TB24 tablet Take 1 tablet (50 mg total) by mouth daily. 30 tablet 11   Multiple Vitamin (MULTIVITAMIN WITH MINERALS) TABS tablet Take 1 tablet by mouth daily. Men's One-A-Day Multivitamin     omeprazole (PRILOSEC) 20 MG capsule Take 1 capsule (20 mg total) by mouth daily. For indigestion and reflux. 30 capsule 0   pregabalin (LYRICA) 50 MG capsule Take 1 capsule (50 mg total) by mouth 2 (two) times daily. 60 capsule 2   Semaglutide 7 MG TABS Take 1 tablet (7 mg total) by mouth daily. 30 tablet 0   tamsulosin (FLOMAX) 0.4 MG CAPS capsule Take 1 capsule (0.4 mg total) by mouth at bedtime. 90 capsule 1   No current facility-administered medications for this visit.     Family History    Family History  Problem Relation Age of Onset   Cancer Father        pancreatic   Diabetes Father    He indicated that his mother is alive. He indicated that his father is deceased.  Social History    Social History   Socioeconomic History   Marital status: Married    Spouse name: Not on file   Number of children: Not on file   Years of education: Not on file   Highest education level: Not on file  Occupational History   Not on file  Tobacco Use   Smoking status: Former    Packs/day: 0.50    Years: 50.00    Additional pack years: 0.00    Total pack years: 25.00    Types: Cigarettes    Passive exposure: Never   Smokeless tobacco: Never   Tobacco comments:    PREVIOUSLY SMOKED 3PPD / DECREASED TO 1PPD SINCE 2009  Vaping Use   Vaping Use: Never used  Substance and Sexual Activity   Alcohol use: Not Currently    Alcohol/week: 2.0 standard drinks of alcohol    Types: 1 Cans of beer, 1 Shots of liquor per week   Drug use: No   Sexual activity: Not Currently  Other Topics Concern   Not on file  Social History Narrative    Not on file   Social Determinants of Health   Financial Resource Strain: Not on file  Food Insecurity: No Food Insecurity (11/27/2022)   Hunger Vital Sign    Worried  About Running Out of Food in the Last Year: Never true    Ran Out of Food in the Last Year: Never true  Transportation Needs: No Transportation Needs (11/27/2022)   PRAPARE - Hydrologist (Medical): No    Lack of Transportation (Non-Medical): No  Physical Activity: Not on file  Stress: Not on file  Social Connections: Not on file  Intimate Partner Violence: Not At Risk (11/27/2022)   Humiliation, Afraid, Rape, and Kick questionnaire    Fear of Current or Ex-Partner: No    Emotionally Abused: No    Physically Abused: No    Sexually Abused: No     Review of Systems    General:  No chills, fever, night sweats or positive for purposeful weight changes, after diuresis.  Cardiovascular:  No chest pain, dyspnea on exertion, edema, orthopnea, palpitations, paroxysmal nocturnal dyspnea. Dermatological: No rash, lesions/masses Respiratory: No cough, dyspnea Urologic: No hematuria, dysuria Abdominal:   No nausea, vomiting, diarrhea, bright red blood per rectum, melena, or hematemesis Neurologic:  No visual changes, wkns, changes in mental status. All other systems reviewed and are otherwise negative except as noted above.     Physical Exam    VS:  BP (!) 100/56   Pulse 86   Ht 5\' 11"  (1.803 m)   Wt 228 lb (103.4 kg)   SpO2 98%   BMI 31.80 kg/m  , BMI Body mass index is 31.8 kg/m.     GEN: Well nourished, well developed, in no acute distress. HEENT: normal. Neck: Supple, no JVD, carotid bruits, or masses. Cardiac: RRR, no murmurs, rubs, or gallops. No clubbing, cyanosis, edema.  Radials/DP/PT 2+ and equal bilaterally.  Respiratory:  Respirations regular and unlabored, clear to auscultation bilaterally. GI: Soft, nontender, nondistended, BS + x 4. MS: no deformity or atrophy.  Wearing  compression wrap hose. Skin: warm and dry, no rash. Neuro:  Strength and sensation are intact. Psych: Normal affect.  Accessory Clinical Findings    ECG personally reviewed by me today-  -not completed this office visit Lab Results  Component Value Date   WBC 9.8 11/27/2022   HGB 13.4 11/27/2022   HCT 41.9 11/27/2022   MCV 91.3 11/27/2022   PLT 168 11/27/2022   Lab Results  Component Value Date   CREATININE 1.00 03/12/2023   BUN 10 03/12/2023   NA 140 03/12/2023   K 3.8 03/12/2023   CL 101 03/12/2023   CO2 26 03/12/2023   Lab Results  Component Value Date   ALT 23 11/27/2022   AST 14 (L) 11/27/2022   ALKPHOS 92 11/27/2022   BILITOT 0.5 11/27/2022   Lab Results  Component Value Date   CHOL 77 (L) 03/21/2022   HDL 28 (L) 03/21/2022   LDLCALC 31 03/21/2022   TRIG 87 03/21/2022   CHOLHDL 2.8 03/21/2022    Lab Results  Component Value Date   HGBA1C 11.8 03/07/2023     Assessment & Plan   1.  Chronic combined systolic diastolic heart failure: Most recent echocardiogram 2017 with EF of 40-45% with grade 1 diastolic dysfunction.  Last office visit significant volume overload requiring subcutaneous insulin infusion, followed by increased dose of Lasix to 40 mg daily.  Repeat echocardiogram is pending to evaluate LV function for changes and to help with medication management.  He is advised to weigh himself daily as he has been.  Avoid salt.  If he gains 2 to 3 pounds in 24 hours or 5 pounds  in 1 week he is to take an additional 20 mg of Lasix.  2.  Hypotension: Concerned about overdiuresis.  But he states at home his blood pressure is normally between 123456 systolic.  He is to take his blood pressure daily at the same time every day and report back.  He is advised that if his blood pressure remains less than 123456 systolic he is to call us.  At this point I will either eliminate or reduce lisinopril to avoid hypotension.  I am especially concerned as he is also on apixaban to  avoid worsening outcomes with head injury from fall.  I have explained this to him and he verbalizes understanding.  3.  Paroxysmal atrial fibrillation: Heart rate remains essentially controlled.  Continue Eliquis 5 mg twice daily.  Samples are provided.  He is not on heart rate control currently with beta-blocker or CCB.  4.  CAD: History of coronary artery bypass grafting in 2014.  He remains asymptomatic with the exception of exacerbation of volume overload on last visit.  Repeat echocardiogram is pending.  Make further recommendations once this result is reviewed.  Continue secondary management with weight loss, statin therapy and blood pressure control.  5.  Hypercholesterolemia: Remains on statin therapy with atorvastatin 10 mg daily.  Goal of LDL less than 50.  Will need to repeat labs if not completed by primary care provider for ongoing surveillance and management of medication regimen.  Most recent labs were completed on 03/21/2022 with total cholesterol 77 HDL 28 LDL 31.  6.  PAD: Follow-up ABIs are planned for March 29, 2023.  Has chronic dependent venous insufficiency edema.  Improved significantly with diuresis however he does still require support hose.  He denies any pain or intermittent claudication symptoms at this time.  Afer review of the note by Dr. Sallyanne Kuster, it is recommended that we plaee a Zio monitor on to evaluate his PAF burden. I will also have follow up BMET drawn due to increased dose of lasix from 20 mg to 40 mg to evaluate potassium status and need for supplement.   KL  Current medicines are reviewed at length with the patient today.  I have spent 25 min's  dedicated to the care of this patient on the date of this encounter to include pre-visit review of records, assessment, management and diagnostic testing,with shared decision making. Signed, Phill Myron. West Pugh, ANP, [place]   03/16/2023 4:47 PM      Office [phone] Fax [phone]  Notice: This  dictation was prepared with Dragon dictation along with smaller phrase technology. Any transcriptional errors that result from this process are unintentional and may not be corrected upon review.

## 2023-03-16 ENCOUNTER — Ambulatory Visit: Payer: PPO | Attending: Adult Health | Admitting: Adult Health

## 2023-03-16 ENCOUNTER — Encounter: Payer: Self-pay | Admitting: Adult Health

## 2023-03-16 VITALS — BP 100/56 | HR 86 | Ht 71.0 in | Wt 228.0 lb

## 2023-03-16 DIAGNOSIS — I872 Venous insufficiency (chronic) (peripheral): Secondary | ICD-10-CM | POA: Diagnosis not present

## 2023-03-16 DIAGNOSIS — I5042 Chronic combined systolic (congestive) and diastolic (congestive) heart failure: Secondary | ICD-10-CM | POA: Diagnosis not present

## 2023-03-16 DIAGNOSIS — I2510 Atherosclerotic heart disease of native coronary artery without angina pectoris: Secondary | ICD-10-CM | POA: Diagnosis not present

## 2023-03-16 DIAGNOSIS — E7800 Pure hypercholesterolemia, unspecified: Secondary | ICD-10-CM

## 2023-03-16 DIAGNOSIS — I480 Paroxysmal atrial fibrillation: Secondary | ICD-10-CM | POA: Diagnosis not present

## 2023-03-16 DIAGNOSIS — I739 Peripheral vascular disease, unspecified: Secondary | ICD-10-CM

## 2023-03-16 MED ORDER — APIXABAN 5 MG PO TABS: 5.0000 mg | ORAL_TABLET | Freq: Two times a day (BID) | ORAL | 0 refills | Status: DC

## 2023-03-16 NOTE — Patient Instructions (Signed)
Medication Instructions:  Stop Lisinopril *If you need a refill on your cardiac medications before your next appointment, please call your pharmacy*   Lab Work: No Labs If you have labs (blood work) drawn today and your tests are completely normal, you will receive your results only by: [facility] (if you have MyChart) OR A paper copy in the mail If you have any lab test that is abnormal or we need to change your treatment, we will call you to review the results.   Testing/Procedures: No Testing   Follow-Up: At [facility], you and your health needs are our priority.  As part of our continuing mission to provide you with exceptional heart care, we have created designated Provider Care Teams.  These Care Teams include your primary Cardiologist (physician) and Advanced Practice Providers (APPs -  Physician Assistants and Nurse Practitioners) who all work together to provide you with the care you need, when you need it.  We recommend signing up for the patient portal called "MyChart".  Sign up information is provided on this After Visit Summary.  MyChart is used to connect with patients for Virtual Visits (Telemedicine).  Patients are able to view lab/test results, encounter notes, upcoming appointments, etc.  Non-urgent messages can be sent to your provider as well.   To learn more about what you can do with MyChart, go to [email or web address].    Your next appointment:   3 month(s)  Provider:   Jory Sims, DNP, ANP   or, Sanda Klein, MD

## 2023-03-17 NOTE — Addendum Note (Signed)
Addended by: Lendon Colonel on: 03/17/2023 10:53 AM   Modules accepted: Orders

## 2023-03-18 ENCOUNTER — Other Ambulatory Visit: Payer: Self-pay | Admitting: Nurse Practitioner

## 2023-03-18 DIAGNOSIS — E1169 Type 2 diabetes mellitus with other specified complication: Secondary | ICD-10-CM

## 2023-03-19 ENCOUNTER — Ambulatory Visit: Payer: PPO | Attending: Adult Health

## 2023-03-19 ENCOUNTER — Telehealth: Payer: Self-pay

## 2023-03-19 DIAGNOSIS — I480 Paroxysmal atrial fibrillation: Secondary | ICD-10-CM

## 2023-03-19 NOTE — Progress Notes (Unsigned)
Enrolled for [employer] to mail a ZIO XT long term holter monitor to the patients address on file.   Dr. Croitoru to read. 

## 2023-03-19 NOTE — Telephone Encounter (Addendum)
Called patient to advise per K. Lawrence a [facility] has been ordered . Patient also advised will need repeat BMET. Patient stated will be in office for testing on 4/4 patietn stated will have lab work after testing when in office on 4/4.----- Message from Lendon Colonel, NP sent at 03/17/2023 10:45 AM EDT ----- Regarding: Zio monitor and labs Dr. Sallyanne Kuster would like a Zio monitor placed on [name] to evaluate if he is in and out of atrial fibrillation. This may be contributing to his fluid overload that we treated with the Furosix and increased does of lasix.  I would also like him to have a BMET this next week. He is not on potassium replacement, and I want to make sure that the higher dose of lasix is not dropping his potassium too much so we can order a supplement if needed. I will order the labs and Zio. Please let him know that we are doing this and why.    Thank you, Curt Bears

## 2023-03-22 ENCOUNTER — Telehealth: Payer: Self-pay

## 2023-03-22 NOTE — Telephone Encounter (Addendum)
Results viewed by patient via MyChart----- Message from Lendon Colonel, NP sent at 03/14/2023  4:24 PM EDT ----- Reviewed labs. Still has elevation in BNP, but at baseline. He was advised to go up on his Lasix over the weekend. Labs are OK concerning kidney function.  KL

## 2023-03-29 ENCOUNTER — Telehealth: Payer: Self-pay | Admitting: Physician Assistant

## 2023-03-29 ENCOUNTER — Ambulatory Visit (HOSPITAL_COMMUNITY)
Admission: RE | Admit: 2023-03-29 | Discharge: 2023-03-29 | Disposition: A | Discharge status: Home / Self Care | Payer: PPO | Source: Ambulatory Visit | Attending: Cardiovascular Disease | Admitting: Cardiovascular Disease

## 2023-03-29 ENCOUNTER — Ambulatory Visit: Payer: PPO | Admitting: Physician Assistant

## 2023-03-29 VITALS — BP 136/89 | HR 75 | Ht 71.0 in | Wt 228.0 lb

## 2023-03-29 DIAGNOSIS — R300 Dysuria: Secondary | ICD-10-CM

## 2023-03-29 DIAGNOSIS — I5042 Chronic combined systolic (congestive) and diastolic (congestive) heart failure: Secondary | ICD-10-CM | POA: Diagnosis not present

## 2023-03-29 DIAGNOSIS — R350 Frequency of micturition: Secondary | ICD-10-CM | POA: Diagnosis not present

## 2023-03-29 DIAGNOSIS — I739 Peripheral vascular disease, unspecified: Secondary | ICD-10-CM | POA: Diagnosis not present

## 2023-03-29 DIAGNOSIS — C61 Malignant neoplasm of prostate: Secondary | ICD-10-CM | POA: Diagnosis not present

## 2023-03-29 LAB — MICROSCOPIC EXAMINATION

## 2023-03-29 LAB — VAS US ABI WITH/WO TBI
Left ABI: 0.74
Right ABI: 1.17

## 2023-03-29 LAB — URINALYSIS, COMPLETE
Bilirubin, UA: NEGATIVE
Ketones, UA: NEGATIVE
Leukocytes,UA: NEGATIVE
Nitrite, UA: NEGATIVE
RBC, UA: NEGATIVE
Specific Gravity, UA: 1.025 (ref 1.005–1.030)
Urobilinogen, Ur: 1 mg/dL (ref 0.2–1.0)
pH, UA: 5 (ref 5.0–7.5)

## 2023-03-29 LAB — BLADDER SCAN AMB NON-IMAGING: Scan Result: 19

## 2023-03-29 MED ORDER — MIRABEGRON ER 50 MG PO TB24: 50.0000 mg | ORAL_TABLET | Freq: Every day | ORAL | 11 refills | Status: DC

## 2023-03-29 NOTE — Progress Notes (Signed)
03/29/2023 1:20 PM   [name] 07/10/1951 [id number]  CC: Chief Complaint  Patient presents with   Follow-up   Urinary Frequency   HPI: [name] is a 72 y.o. male with PMH DM2, CVA with residual right-sided deficits, prostate cancer s/p brachytherapy and ADT in 2019 with evidence of radiation changes on cystoscopy in February 2023, and OAB wet who presents today for symptom recheck on Myrbetriq 50 mg.   Today he reports his urgency, frequency, and urge incontinence have significantly improved on Myrbetriq.  He is now having nocturia x 1-2, previously hourly.  Overall he is very pleased and wishes to continue the medication.  Additionally, he reports some chronic dysuria, duration unclear.  It is currently intermittent and he denies episodes of gross hematuria.  He reports a friend of his also has prostate cancer s/p brachytherapy, and Flomax helped him.  He wonders if he should try Flomax.  Per review of his medication list, he is already taking this once daily.  He also wonders if he is due for PSA monitoring.  Per chart review, PSA on 01/11/2022 was undetectable.  PVR 57mL.  PMH: Past Medical History:  Diagnosis Date   Abdominal aortic aneurysm (AAA) (HCC)    3 cm per 07-02-18 US abdominal aorta US epic   Arthritis    Blood clot in vein    behind left knee   Chronic venous insufficiency LOWER EXTREMITIES   Coronary artery disease CARDIOLOGIST - DR  [id number]  LAST 1 WK AGO -- WILL REQUEST NOTE AND STRESS TEST   Diabetes mellitus without complication ([place])    type 2   Hematuria    last year   History of kidney stones    Hyperglycemia    Hyperlipidemia    Hypertension    Mixed dyslipidemia    Myocardial infarction (HCC)    Neuropathy    toes only   Nocturia    Prostate cancer ([place]) 08/18/2011   gleason 8, volume 24.4cc   S/P CABG x 4    ST elevation MI (STEMI) ([place]) 02/10/2008   S/P CABG   Stroke ([place]) 2016   occ trouble wriing with right hand    Urinary hesitancy    Vision abnormalities    resolved now   Wound discharge    since jan 2019 goes to wound center [place] left great toe small open area changes dressing q 2 days with ointment provided by wound center, clear drainage occ    Surgical History: Past Surgical History:  Procedure Laterality Date   CARDIAC CATHETERIZATION  05/01/2012   grafts widely patent   CARDIOVASCULAR STRESS TEST  03-15-2010   INFERIOR WALL SCAR WITHOUT ANY MEANINGFUL ISCHEMIA/ EF 46% / LOW RISK SCAN   CATARACT EXTRACTION     CORONARY ARTERY BYPASS GRAFT  02-10-2008  DR Einar Gip   X4 VESSEL DISEASE / EMERGANT CABG   CYSTOSCOPY  02/08/2012   Procedure: CYSTOSCOPY FLEXIBLE;  Surgeon: Franchot Gallo, MD;  Location: [facility];  Service: Urology;;   CYSTOSCOPY WITH RETROGRADE PYELOGRAM, URETEROSCOPY AND STENT PLACEMENT Right 09/12/2018   Procedure: CYSTOSCOPY WITH RETROGRADE PYELOGRAM, URETEROSCOPY AND STENT PLACEMENT, STONE EXTRACTION;  Surgeon: Franchot Gallo, MD;  Location: [facility];  Service: Urology;  Laterality: Right;   EP IMPLANTABLE DEVICE N/A 08/14/2016   Procedure: Loop Recorder Insertion;  Surgeon: Evans Lance, MD;  Location: [facility];  Service: Cardiovascular;  Laterality: N/A;   EXTRACORPOREAL SHOCK WAVE LITHOTRIPSY  2013  HOLMIUM LASER APPLICATION Right [id number]   Procedure: HOLMIUM LASER APPLICATION;  Surgeon: Franchot Gallo, MD;  Location: [facility];  Service: Urology;  Laterality: Right;   KNEE SURGERY  age 4   RIGHT   LEFT HEART CATHETERIZATION WITH CORONARY/GRAFT ANGIOGRAM N/A 05/01/2012   Procedure: LEFT HEART CATHETERIZATION WITH Beatrix Fetters;  Surgeon: Sanda Klein, MD;  Location: [facility];  Service: Cardiovascular;  Laterality: N/A;   MULTIPLE TEETH EXTRACTIONS (23)/ FOUR QUADRANT ALVEOLPLASTY/ MANDIBULAR LATERAL EXOSTOSES REDUCTIONS  03-24-2010   CHRONIC PERIODONTITIS   RADIOACTIVE SEED IMPLANT  02/08/2012   Procedure: RADIOACTIVE  SEED IMPLANT;  Surgeon: Franchot Gallo, MD;  Location: [facility];  Service: Urology;  Laterality: N/A;  C-ARM    SHOULDER SURGERY  1982   LEFT   TEE WITHOUT CARDIOVERSION N/A 08/14/2016   Procedure: TRANSESOPHAGEAL ECHOCARDIOGRAM (TEE);  Surgeon: Josue Hector, MD;  Location: [facility];  Service: Cardiovascular;  Laterality: N/A;   TOTAL KNEE ARTHROPLASTY Right 11/27/2022   Procedure: TOTAL KNEE ARTHROPLASTY;  Surgeon: Vickey Huger, MD;  Location: [facility];  Service: Orthopedics;  Laterality: Right;   URETERAL STENT PLACEMENT      Home Medications:  Allergies as of 03/29/2023       Reactions   Bee Venom Anaphylaxis   Penicillins Other (See Comments)   CAUSES "FREE BLEEDING" Has patient had a PCN reaction causing immediate rash, facial/tongue/throat swelling, SOB or lightheadedness with hypotension: No Has patient had a PCN reaction causing severe rash involving mucus membranes or skin necrosis: No Has patient had a PCN reaction that required hospitalization No Has patient had a PCN reaction occurring within the last 10 years: No If all of the above answers are "NO", then may proceed with Cephalosporin use. Other Reaction(s): Other (See Comments) CAUSES "FREE BLEEDING", Has patient had a PCN reaction causing immediate rash, facial/tongue/throat swelling, SOB or lightheadedness with hypotension: No, Has patient had a PCN reaction causing severe rash involving mucus membranes or skin necrosis: No, Has patient had a PCN reaction that required hospitalization No, Has patient had a PCN reaction occurring within the last 10 years: No, If all of the above answers are "NO", then may proceed with Cephalosporin use.        Medication List        Accurate as of March 29, 2023  1:20 PM. If you have any questions, ask your nurse or doctor.          acetaminophen 500 MG tablet Commonly known as: TYLENOL Take 1,000 mg by mouth every 8 (eight) hours as needed for moderate  pain.   apixaban 5 MG Tabs tablet Commonly known as: Eliquis Take 1 tablet (5 mg total) by mouth 2 (two) times daily.   apixaban 5 MG Tabs tablet Commonly known as: ELIQUIS Take 1 tablet (5 mg total) by mouth 2 (two) times daily.   atorvastatin 10 MG tablet Commonly known as: LIPITOR Take 1 tablet (10 mg total) by mouth daily.   blood glucose meter kit and supplies Kit Dispense based on patient and insurance preference.   dextromethorphan-guaiFENesin 30-600 MG 12hr tablet Commonly known as: MUCINEX DM Take 1 tablet by mouth 2 (two) times daily.   FreeStyle Libre 14 Day Reader Kerrin Mo 1 Device by Does not apply route daily. Use to check blood sugars every morning and 2 hours after largest meals   FreeStyle Libre 14 Day Sensor Misc USE AS DIRECTED   furosemide 20 MG tablet Commonly known as: LASIX Take 1 tablet (  20 mg total) by mouth daily. What changed:  how much to take additional instructions   glipiZIDE 5 MG tablet Commonly known as: GLUCOTROL Take 1 tablet (5 mg total) by mouth 2 (two) times daily before a meal.   glucose blood test strip Commonly known as: FREESTYLE LITE Use to check blood sugars every morning fasting   mirabegron ER 50 MG Tb24 tablet Commonly known as: MYRBETRIQ Take 1 tablet (50 mg total) by mouth daily.   multivitamin with minerals Tabs tablet Take 1 tablet by mouth daily. Men's One-A-Day Multivitamin   omeprazole 20 MG capsule Commonly known as: PRILOSEC Take 1 capsule (20 mg total) by mouth daily. For indigestion and reflux.   pregabalin 50 MG capsule Commonly known as: Lyrica Take 1 capsule (50 mg total) by mouth 2 (two) times daily.   Semaglutide 7 MG Tabs Take 1 tablet (7 mg total) by mouth daily.   tamsulosin 0.4 MG Caps capsule Commonly known as: FLOMAX Take 1 capsule (0.4 mg total) by mouth at bedtime.        Allergies:  Allergies  Allergen Reactions   Bee Venom Anaphylaxis   Penicillins Other (See Comments)     CAUSES "FREE BLEEDING"  Has patient had a PCN reaction causing immediate rash, facial/tongue/throat swelling, SOB or lightheadedness with hypotension: No  Has patient had a PCN reaction causing severe rash involving mucus membranes or skin necrosis: No  Has patient had a PCN reaction that required hospitalization No  Has patient had a PCN reaction occurring within the last 10 years: No  If all of the above answers are "NO", then may proceed with Cephalosporin use.  Other Reaction(s): Other (See Comments)  CAUSES "FREE BLEEDING", Has patient had a PCN reaction causing immediate rash, facial/tongue/throat swelling, SOB or lightheadedness with hypotension: No, Has patient had a PCN reaction causing severe rash involving mucus membranes or skin necrosis: No, Has patient had a PCN reaction that required hospitalization No, Has patient had a PCN reaction occurring within the last 10 years: No, If all of the above answers are "NO", then may proceed with Cephalosporin use.    Family History: Family History  Problem Relation Age of Onset   Cancer Father        pancreatic   Diabetes Father     Social History:   reports that he has quit smoking. His smoking use included cigarettes. He has a 25.00 pack-year smoking history. He has never been exposed to tobacco smoke. He has never used smokeless tobacco. He reports that he does not currently use alcohol after a past usage of about 2.0 standard drinks of alcohol per week. He reports that he does not use drugs.  Physical Exam: BP 136/89   Pulse 75   Ht 5\' 11"  (1.803 m)   Wt 228 lb (103.4 kg)   BMI 31.80 kg/m   Constitutional:  Alert and oriented, no acute distress, nontoxic appearing HEENT: [place], AT Cardiovascular: No clubbing, cyanosis, or edema Respiratory: Normal respiratory effort, no increased work of breathing Skin: No rashes, bruises or suspicious lesions Neurologic: Grossly intact, no focal deficits, moving all 4  extremities Psychiatric: Normal mood and affect  Laboratory Data: Results for orders placed or performed in visit on 03/29/23  Microscopic Examination   Urine  Result Value Ref Range   WBC, UA 0-5 0 - 5 /hpf   RBC, Urine 0-2 0 - 2 /hpf   Epithelial Cells (non renal) 0-10 0 - 10 /hpf   Bacteria, UA  Few None seen/Few  Urinalysis, Complete  Result Value Ref Range   Specific Gravity, UA 1.025 1.005 - 1.030   pH, UA 5.0 5.0 - 7.5   Color, UA Yellow Yellow   Appearance Ur Clear Clear   Leukocytes,UA Negative Negative   Protein,UA 1+ (A) Negative/Trace   Glucose, UA 1+ (A) Negative   Ketones, UA Negative Negative   RBC, UA Negative Negative   Bilirubin, UA Negative Negative   Urobilinogen, Ur 1.0 0.2 - 1.0 mg/dL   Nitrite, UA Negative Negative   Microscopic Examination See below:   BLADDER SCAN AMB NON-IMAGING  Result Value Ref Range   Scan Result 19 ml    Assessment & Plan:   1. Urinary frequency Significantly improved on Myrbetriq and he is emptying his bladder well.  Will plan to continue this. - BLADDER SCAN AMB NON-IMAGING - mirabegron ER (MYRBETRIQ) 50 MG TB24 tablet; Take 1 tablet (50 mg total) by mouth daily.  Dispense: 30 tablet; Refill: 11  2. Dysuria Will obtain UA today and treat as indicated, calling patient with results.  We discussed if not infectious, this may be secondary to brachytherapy.  We discussed increasing Flomax to twice daily to see if it is helpful for him.  He will keep me posted. - Urinalysis, Complete  3. Prostate cancer Overdue for annual PSA monitoring, will obtain today and contact with results. - PSA   Return in about 1 year (around 03/28/2024) for Annual follow-up with PSA, IPSS, PVR, Will call with results.  Debroah Loop, PA-C  [facility] [address] [place],  [id number] [phone]

## 2023-03-29 NOTE — Telephone Encounter (Signed)
error 

## 2023-03-29 NOTE — Patient Instructions (Addendum)
Continue Myrbetriq for your urgency and frequency. For the burning, you may try increasing your Flomax (tamsulosin) to twice daily. If you develop dizziness or lightheadedness doing this or if it doesn't make the burning better, then go back to once daily.

## 2023-03-30 LAB — BASIC METABOLIC PANEL
BUN/Creatinine Ratio: 17 (ref 10–24)
BUN: 13 mg/dL (ref 8–27)
CO2: 22 mmol/L (ref 20–29)
Calcium: 9 mg/dL (ref 8.6–10.2)
Chloride: 103 mmol/L (ref 96–106)
Creatinine, Ser: 0.76 mg/dL (ref 0.76–1.27)
Glucose: 230 mg/dL — ABNORMAL HIGH (ref 70–99)
Potassium: 4.2 mmol/L (ref 3.5–5.2)
Sodium: 138 mmol/L (ref 134–144)
eGFR: 96 mL/min/{1.73_m2} (ref >59)

## 2023-03-30 LAB — PSA: Prostate Specific Ag, Serum: <0.1 ng/mL (ref 0.0–4.0)

## 2023-04-02 ENCOUNTER — Other Ambulatory Visit: Payer: Self-pay | Admitting: Nurse Practitioner

## 2023-04-02 DIAGNOSIS — E1142 Type 2 diabetes mellitus with diabetic polyneuropathy: Secondary | ICD-10-CM

## 2023-04-03 ENCOUNTER — Telehealth: Payer: Self-pay | Admitting: *Deleted

## 2023-04-03 DIAGNOSIS — E1142 Type 2 diabetes mellitus with diabetic polyneuropathy: Secondary | ICD-10-CM

## 2023-04-03 MED ORDER — GABAPENTIN 600 MG PO TABS: ORAL_TABLET | ORAL | 3 refills | Status: DC

## 2023-04-03 NOTE — Telephone Encounter (Signed)
Sent in

## 2023-04-03 NOTE — Addendum Note (Signed)
Addended by: Saralyn Pilar on: 04/03/2023 04:58 PM   Modules accepted: Orders

## 2023-04-03 NOTE — Telephone Encounter (Signed)
Pt calling stating that he feels like he needs to have his gabapentin,  he said the other medication is not helping.  He stated that he is not able to sleep and feels like he has needles in his feet.  He would like this to be sent to the [facility] on [address]. Please advise.

## 2023-04-03 NOTE — Telephone Encounter (Signed)
Contacted pt and informed him of below and he said he would like the gabapentin, he said it has worked well for him and he would like it to be sent to the [facility] on [address].

## 2023-04-03 NOTE — Telephone Encounter (Signed)
Please clarify with him which medication he has found most helpful.  I believe that on 01/23/2023 when he saw Herbert Seta he expressed that the gabapentin was not helpful for his neuropathy, at that time he was taking gabapentin 600 mg twice a day.  Most recently he has been trying pregabalin 50 mg twice a day, and we can actually increase this by quite a bit.  If he wants to switch back to the gabapentin from January and earlier, we can do that.  If he wants to increase the pregabalin that he is currently taking, that is also an option.  Let me know and I will send in the correct prescription.

## 2023-04-04 ENCOUNTER — Telehealth: Payer: Self-pay | Admitting: Adult Health

## 2023-04-04 ENCOUNTER — Ambulatory Visit (HOSPITAL_COMMUNITY): Payer: PPO | Attending: Internal Medicine

## 2023-04-04 DIAGNOSIS — I5020 Unspecified systolic (congestive) heart failure: Secondary | ICD-10-CM | POA: Insufficient documentation

## 2023-04-04 DIAGNOSIS — I480 Paroxysmal atrial fibrillation: Secondary | ICD-10-CM | POA: Diagnosis not present

## 2023-04-04 LAB — ECHOCARDIOGRAM COMPLETE: S' Lateral: 3.5 cm

## 2023-04-04 MED ORDER — AMIODARONE HCL 200 MG PO TABS: ORAL_TABLET | ORAL | 3 refills | Status: DC

## 2023-04-04 MED ORDER — PERFLUTREN LIPID MICROSPHERE
1.0000 mL | INTRAVENOUS | Status: AC | PRN
Start: 2023-04-04 — End: 2023-04-04
  Administered 2023-04-04: 2 mL via INTRAVENOUS

## 2023-04-04 NOTE — Telephone Encounter (Signed)
[name] with [employer] calling with abnormal zio patch results.   

## 2023-04-04 NOTE — Telephone Encounter (Signed)
Called pt. He has been added to April 30th at 9:40

## 2023-04-04 NOTE — Telephone Encounter (Signed)
I looked at his monitor and his echocardiogram.  I put a note in there that I addressed to Florentina Addison and Joni Reining.  Can you please take care of the amiodarone order?  Thanks

## 2023-04-04 NOTE — Telephone Encounter (Signed)
Called pt to relay Dr. Renaye Rakers recommendations. Prescription for Amiodarone sent into pt's preferred pharmacy. Pt given all the instructions about lab work, skin protection and reports symptoms. He verbalized understanding. No further questions at this time. Looked over schedules to set an earlier appt. No appt available in 1 mont. Will reach out to Dr. Salena Saner and his nurse for further direction.

## 2023-04-04 NOTE — Telephone Encounter (Signed)
March 28-April 4th 42% fib flutter Rapid 183 bpm for 60 secs.  Strip 4 page 10.  1 run V tach      Cardiac Monitor Alert  Date of alert:  04/04/2023   Patient Name: [name]  DOB: 17-Oct-1951  MRN: [id number]   [facility] Cardiologist: Thurmon Fair, MD  [facility] EP:  None    Monitor Information: Long Term Monitor [ZioXT]  Reason:  Evaluate PAF Burden  Ordering provider:  Joni Reining, NP   Eliquis  06/19/23   1 run V tach     Alert Atrial Fibrillation/Flutter This is the 1st alert for this rhythm.  The patient has a hx of Atrial Fibrillation/Flutter.    Anticoagulation medication as of 04/04/2023           apixaban (ELIQUIS) 5 MG TABS tablet Take 1 tablet (5 mg total) by mouth 2 (two) times daily.   apixaban (ELIQUIS) 5 MG TABS tablet Take 1 tablet (5 mg total) by mouth 2 (two) times daily.       Next Cardiology Appointment  Date:  06/19/23  Provider:  Joni Reining   The patient was contacted today.  He is asymptomatic.

## 2023-04-04 NOTE — Telephone Encounter (Signed)
Please add on to the end of my schedule April 30

## 2023-04-05 ENCOUNTER — Telehealth: Payer: Self-pay

## 2023-04-05 NOTE — Telephone Encounter (Addendum)
Called [name] regarding results. [name] had understanding of results. [name] aware of follow up appointmment with Dr. Salena Saner on 04/24/2023----- Message from Jodelle Gross, NP sent at 04/04/2023  3:25 PM EDT ----- Has been read by Dr. Royann Shivers and made recommendations for amiodarone with follow up appointment on April 30 th with him.

## 2023-04-05 NOTE — Telephone Encounter (Addendum)
Patient aware of results. Patient has understanding of results. Patient aware of f/u appointment on 04/24/23 with Dr. Salena Saner.----- Message from Jodelle Gross, NP sent at 04/04/2023  3:24 PM EDT ----- Has been reviewed by Dr. Royann Shivers with recommendations to start amiodarone and have follow up appointment with him in April.

## 2023-04-05 NOTE — Telephone Encounter (Addendum)
-  Called patient regarding results. Patient advised to follow up with PCP regarding elevated Glucose level. Patient had understanding of results.---- Message from Jodelle Gross, NP sent at 03/30/2023  8:48 AM EDT ----- I have reviewed his labs. All are normal with the exception of his glucose which is very elevated. Needs better control of diabetes and should follow up with PCP concerning this. Hopefully he got his Zio monitor.  KL

## 2023-04-18 ENCOUNTER — Ambulatory Visit (INDEPENDENT_AMBULATORY_CARE_PROVIDER_SITE_OTHER): Payer: PPO | Admitting: Family Medicine

## 2023-04-18 ENCOUNTER — Encounter: Payer: Self-pay | Admitting: Family Medicine

## 2023-04-18 VITALS — BP 137/82 | HR 63 | Resp 18 | Ht 71.0 in | Wt 229.0 lb

## 2023-04-18 DIAGNOSIS — I152 Hypertension secondary to endocrine disorders: Secondary | ICD-10-CM

## 2023-04-18 DIAGNOSIS — E1159 Type 2 diabetes mellitus with other circulatory complications: Secondary | ICD-10-CM

## 2023-04-18 DIAGNOSIS — Z7985 Long-term (current) use of injectable non-insulin antidiabetic drugs: Secondary | ICD-10-CM

## 2023-04-18 DIAGNOSIS — E1169 Type 2 diabetes mellitus with other specified complication: Secondary | ICD-10-CM

## 2023-04-18 DIAGNOSIS — E669 Obesity, unspecified: Secondary | ICD-10-CM

## 2023-04-18 MED ORDER — RYBELSUS 7 MG PO TABS
7.0000 mg | ORAL_TABLET | Freq: Every day | ORAL | 1 refills | Status: DC
Start: 2023-05-16 — End: 2023-04-24

## 2023-04-18 MED ORDER — RYBELSUS 3 MG PO TABS
3.0000 mg | ORAL_TABLET | Freq: Every day | ORAL | 0 refills | Status: DC
Start: 2023-04-18 — End: 2023-07-18

## 2023-04-18 NOTE — Patient Instructions (Signed)
When you go to the pharmacy to pick up your Rybelsus, let them know that you need the following vaccines: - Shingles 2nd dose - PCV20 pneumonia  Have them double check their records that you have not received these before as they will likely be able to see this sooner than we have the records faxed over to [place].

## 2023-04-18 NOTE — Assessment & Plan Note (Signed)
Continue follow-up with cardiology, next appointment with them is on 04/24/2023.  Continue ambulatory blood pressure monitoring.  Continue Lasix 40 mg daily, amiodarone 5 mg daily.  Most recent BMP within normal limits.  Will continue to monitor and reviewed cardiology note from upcoming appointment next week.

## 2023-04-18 NOTE — Progress Notes (Signed)
Established Patient Office Visit  Subjective   Patient ID: [name], male    DOB: Feb 29, 1951  Age: 72 y.o. MRN: [id number]  Chief Complaint  Patient presents with   Diabetes   Hypertension    HPI [name] is a 72 y.o. male presenting today for follow up of diabetes, hypertension. Diabetes: denies hypoglycemic events, wounds or sores that are not healing well, increased thirst or urination. Denies vision problems, eye exam completed 08/18/2022. Checking glucose at home, ranges have been high, typically between 140 and 200 but occasionally higher than 200.  He finished the 1 month sample of Rybelsus and tolerated it well, but he never got a call from the pharmacy to pick up the next month supply of Rybelsus. Hypertension: Patient here for follow-up of elevated blood pressure. He is not exercising and is not adherent to low salt diet.   Pt denies chest pain, SOB, dizziness, edema, syncope, fatigue or heart palpitations. Taking furosemide, amiodarone, reports excellent compliance with treatment. Denies side effects.  Since last appointment with concerns of heart failure he has followed up with cardiology several times.  He was diuresed with both Lasix and subcutaneous Furoscix in their office and ultimately reduced his weight to its baseline.  Echocardiogram was repeated and ZIO monitor was applied which revealed a run of V. tach, cardiology added amiodarone to his furosemide and have a follow-up appointment on 04/24/2023.  Patient had a soft blood pressure in the office resulting in discontinuation of lisinopril, and he has been instructed to keep track of his blood pressure at home until he sees them again, states that it has been running in the 130s systolic.  ROS Negative unless otherwise noted in HPI   Objective:     BP 137/82 (BP Location: Left Arm, Patient Position: Sitting, Cuff Size: Large)   Pulse 63   Resp 18   Ht  (1.803 m)   Wt 229 lb (103.9 kg)   SpO2 98%    BMI 31.94 kg/m   Physical Exam Constitutional:      General: He is not in acute distress.    Appearance: Normal appearance.  HENT:     Head: Normocephalic and atraumatic.  Cardiovascular:     Rate and Rhythm: Normal rate and regular rhythm.     Pulses: Normal pulses.     Heart sounds: Normal heart sounds. No murmur heard.    No friction rub. No gallop.  Pulmonary:     Effort: Pulmonary effort is normal.     Breath sounds: Normal breath sounds. No wheezing, rhonchi or rales.  Feet:     Right foot:     Protective Sensation: 10 sites tested.  5 sites sensed.     Skin integrity: Skin integrity normal.     Toenail Condition: Right toenails are abnormally thick.     Left foot:     Protective Sensation: 10 sites tested.  4 sites sensed.     Skin integrity: Skin integrity normal.     Toenail Condition: Left toenails are abnormally thick.     Comments: Stasis dermatitis present on calves and ankles bilaterally Skin:    General: Skin is warm and dry.  Neurological:     Mental Status: He is alert and oriented to person, place, and time.  Psychiatric:        Mood and Affect: Mood normal.     Assessment & Plan:  Hypertension associated with diabetes Assessment & Plan: Continue follow-up with cardiology,  next appointment with them is on 04/24/2023.  Continue ambulatory blood pressure monitoring.  Continue Lasix 40 mg daily, amiodarone 5 mg daily.  Most recent BMP within normal limits.  Will continue to monitor and reviewed cardiology note from upcoming appointment next week.   Type 2 diabetes mellitus with obesity Assessment & Plan: A1c at last appointment was 11.8.  He tolerated the sample of Rybelsus well and insurance approved the prescription, but he never picked up the 7 mg daily tablets.  We will restart with 1 month of Rybelsus 3 mg and after that increase to Rybelsus 7 mg.  Follow-up in 3 months to recheck A1c and CMP for medication monitoring.  If A1c still not at goal of less  than 7.0, can increase Rybelsus to 14 mg daily if tolerating well.  May also recommend meeting with a nutritionist to discuss strategies for sticking to diabetic diet as well as DASH diet. Foot exam completed today.  Orders: -     Rybelsus; Take 1 tablet (3 mg total) by mouth daily.  Dispense: 30 tablet; Refill: 0 -     Rybelsus; Take 1 tablet (7 mg total) by mouth daily.  Dispense: 30 tablet; Refill: 1    Return in about 3 months (around 07/18/2023) for follow-up for DM, HTN, HLD; fasting blood work 1 week before.   I spent 45 minutes on the day of the encounter to include pre-visit record review from encounters with cardiology and urology, review of echocardiogram and lab results, face-to-face time with the patient, and post visit ordering of medications.  Melida Quitter, PA

## 2023-04-18 NOTE — Assessment & Plan Note (Addendum)
A1c at last appointment was 11.8.  He tolerated the sample of Rybelsus well and insurance approved the prescription, but he never picked up the 7 mg daily tablets.  We will restart with 1 month of Rybelsus 3 mg and after that increase to Rybelsus 7 mg.  Follow-up in 3 months to recheck A1c and CMP for medication monitoring.  If A1c still not at goal of less than 7.0, can increase Rybelsus to 14 mg daily if tolerating well.  May also recommend meeting with a nutritionist to discuss strategies for sticking to diabetic diet as well as DASH diet. Foot exam completed today.

## 2023-04-24 ENCOUNTER — Ambulatory Visit: Payer: PPO | Attending: Cardiovascular Disease | Admitting: Cardiovascular Disease

## 2023-04-24 ENCOUNTER — Encounter: Payer: Self-pay | Admitting: Cardiovascular Disease

## 2023-04-24 VITALS — BP 116/62 | HR 63 | Ht 70.0 in | Wt 229.0 lb

## 2023-04-24 DIAGNOSIS — I480 Paroxysmal atrial fibrillation: Secondary | ICD-10-CM | POA: Diagnosis not present

## 2023-04-24 DIAGNOSIS — Z79899 Other long term (current) drug therapy: Secondary | ICD-10-CM | POA: Diagnosis not present

## 2023-04-24 DIAGNOSIS — E669 Obesity, unspecified: Secondary | ICD-10-CM | POA: Diagnosis not present

## 2023-04-24 DIAGNOSIS — D6869 Other thrombophilia: Secondary | ICD-10-CM

## 2023-04-24 DIAGNOSIS — E785 Hyperlipidemia, unspecified: Secondary | ICD-10-CM | POA: Diagnosis not present

## 2023-04-24 DIAGNOSIS — I872 Venous insufficiency (chronic) (peripheral): Secondary | ICD-10-CM | POA: Diagnosis not present

## 2023-04-24 DIAGNOSIS — I2510 Atherosclerotic heart disease of native coronary artery without angina pectoris: Secondary | ICD-10-CM

## 2023-04-24 DIAGNOSIS — I5042 Chronic combined systolic (congestive) and diastolic (congestive) heart failure: Secondary | ICD-10-CM | POA: Diagnosis not present

## 2023-04-24 DIAGNOSIS — I10 Essential (primary) hypertension: Secondary | ICD-10-CM | POA: Diagnosis not present

## 2023-04-24 DIAGNOSIS — I739 Peripheral vascular disease, unspecified: Secondary | ICD-10-CM

## 2023-04-24 DIAGNOSIS — E1169 Type 2 diabetes mellitus with other specified complication: Secondary | ICD-10-CM

## 2023-04-24 LAB — COMPREHENSIVE METABOLIC PANEL
ALT: 13 IU/L (ref 0–44)
AST: 12 IU/L (ref 0–40)
Albumin/Globulin Ratio: 1.3 (ref 1.2–2.2)
Albumin: 4.3 g/dL (ref 3.8–4.8)
Alkaline Phosphatase: 129 IU/L — ABNORMAL HIGH (ref 44–121)
BUN/Creatinine Ratio: 17 (ref 10–24)
BUN: 16 mg/dL (ref 8–27)
Bilirubin Total: 0.5 mg/dL (ref 0.0–1.2)
CO2: 24 mmol/L (ref 20–29)
Calcium: 10.1 mg/dL (ref 8.6–10.2)
Chloride: 101 mmol/L (ref 96–106)
Creatinine, Ser: 0.93 mg/dL (ref 0.76–1.27)
Globulin, Total: 3.2 g/dL (ref 1.5–4.5)
Glucose: 138 mg/dL — ABNORMAL HIGH (ref 70–99)
Potassium: 5 mmol/L (ref 3.5–5.2)
Sodium: 138 mmol/L (ref 134–144)
Total Protein: 7.5 g/dL (ref 6.0–8.5)
eGFR: 88 mL/min/{1.73_m2} (ref >59)

## 2023-04-24 LAB — TSH: TSH: 4.22 u[IU]/mL (ref 0.450–4.500)

## 2023-04-24 MED ORDER — APIXABAN 5 MG PO TABS
5.0000 mg | ORAL_TABLET | Freq: Two times a day (BID) | ORAL | 3 refills | Status: DC
Start: 2023-04-24 — End: 2023-07-10

## 2023-04-24 NOTE — Progress Notes (Signed)
Gave pt a BP monitor after he requested it

## 2023-04-24 NOTE — Patient Instructions (Signed)
Medication Instructions:  No changes *If you need a refill on your cardiac medications before your next appointment, please call your pharmacy*   Lab Work: CMET and TSH If you have labs (blood work) drawn today and your tests are completely normal, you will receive your results only by: MyChart Message (if you have MyChart) OR A paper copy in the mail If you have any lab test that is abnormal or we need to change your treatment, we will call you to review the results.  Follow-Up: At [facility], you and your health needs are our priority.  As part of our continuing mission to provide you with exceptional heart care, we have created designated Provider Care Teams.  These Care Teams include your primary Cardiologist (physician) and Advanced Practice Providers (APPs -  Physician Assistants and Nurse Practitioners) who all work together to provide you with the care you need, when you need it.  We recommend signing up for the patient portal called "MyChart".  Sign up information is provided on this After Visit Summary.  MyChart is used to connect with patients for Virtual Visits (Telemedicine).  Patients are able to view lab/test results, encounter notes, upcoming appointments, etc.  Non-urgent messages can be sent to your provider as well.   To learn more about what you can do with MyChart, go to [email or web address].    Your next appointment:   6 month(s)  Provider:   Thurmon Fair, MD

## 2023-04-24 NOTE — Progress Notes (Signed)
Cardiology Office Note   Date:  04/24/2023   ID:  Canuto, Kingston [birth year], MRN [id number]  PCP:  Melida Quitter, PA  Cardiologist:  Thurmon Fair, MD  Electrophysiologist:  None   Evaluation Performed:  Follow-Up Visit  Chief Complaint:  CAD, AFib  History of Present Illness:    [name] is a 72 y.o. male with heart failure with mildly depressed left ventricular systolic function, coronary artery disease, CABG 4 in 2009, prior inferior wall infarction, hypertension, hyperlipidemia, type 2 diabetes mellitus, venous insufficiency of the lower extremities, previous right frontal cortical infarct, PAD of lower extremities, asymptomatic atrial fibrillation detected by implantable loop recorder.  Recently developed overt findings of congestive heart failure requiring diuretic therapy.  Echo did not show a major change in LV systolic function with an ejection fraction of 40-45%.  He has had a marked increase in the burden of atrial fibrillation which was 42% on his recent 2-week monitor.  He was unaware of the arrhythmia, as in the past.  Has frequent periods with rapid ventricular response.  When he was last seen in clinic on 03/16/2023 he was in atrial fibrillation with a ventricular rate of 115 bpm.  Is therefore likely that he developed a component of tachycardia cardiomyopathy.  At baseline his ECG shows a relatively long QTc interval at about 480-490 ms.  Because of history of CAD and depressed LV systolic function and his QT interval, the only appropriate antiarrhythmic was amiodarone which was started couple weeks ago.  He is tolerating it well so far without overt side effects.  He is aware of the need to avoid excessive sun exposure since the medication will make you more photosensitive.  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  He is wearing a cap and long-sleeve shirts.  Reviewed the other potential side effects his liver, thyroid, lungs, possibility of  multiple drug interactions, the need for yearly eye examination.  The patient specifically denies any chest pain at rest or with exertion, dyspnea at rest or with exertion, orthopnea, paroxysmal nocturnal dyspnea, syncope, palpitations, focal neurological deficits, intermittent claudication, unexplained weight gain, cough, hemoptysis or wheezing.  He has chronic lower extremity edema and wears compression stockings with good results.  The right leg is always more swollen.  He has occasional symptoms suggestive of intermittent claudication in his left leg, but these do not interfere with regular activities.  Before having his knee replacement surgery in December he paid a lot more attention to improving glycemic control and his last hemoglobin A1c was down to 7.1%.  He also had to quit smoking before the knee replacement and thankfully he has not resumed smoking since then.  He has not gained weight.  He had to stop metformin due to diarrhea but continues to take Rybelsus and glipizide.  His most recent lipid profile shows an excellent LDL of 31, but also a very low HDL cholesterol of only 28.  He is on atorvastatin.  She does not have a history of liver problems or thyroid problems.  He had baseline pulmonary function test performed in 2023.  These showed FVC 3.3 L (80% predicted) and FEV1 2.4 L (80% predicted), DLCO 68% of predicted.  He has a remote history of stroke.  He has significant arterial disease in the left leg, ABI 0.77, occlusion of SFA, brief reconstitution and occlusion of mid popliteal and tibioperoneal trunk and peroneal, posterior tibial mid-calf reconstitution (duplex US 02/2019, unchanged in 02/2020).  He had a vascular screen performed in August 2019 that did not show any evidence of focal stenoses in the aorta or iliac arteries, mild abdominal aortic ectasia with a maximum diameter of 2.4 cm.  He did not have any meaningful carotid stenoses by ultrasound in 2017.    Past Medical  History:  Diagnosis Date   Abdominal aortic aneurysm (AAA) (HCC)    3 cm per 07-02-18 US abdominal aorta US epic   Arthritis    Blood clot in vein    behind left knee   Chronic venous insufficiency LOWER EXTREMITIES   Coronary artery disease CARDIOLOGIST - DR  ZOXWRUEA-  LAST 1 WK AGO -- WILL REQUEST NOTE AND STRESS TEST   Diabetes mellitus without complication (HCC)    type 2   Hematuria    last year   History of kidney stones    Hyperglycemia    Hyperlipidemia    Hypertension    Mixed dyslipidemia    Myocardial infarction (HCC)    Neuropathy    toes only   Nocturia    Prostate cancer (HCC) 08/18/2011   gleason 8, volume 24.4cc   S/P CABG x 4    ST elevation MI (STEMI) (HCC) 02/10/2008   S/P CABG   Stroke (HCC) 2016   occ trouble wriing with right hand   Urinary hesitancy    Vision abnormalities    resolved now   Wound discharge    since jan 2019 goes to wound center [place] left great toe small open area changes dressing q 2 days with ointment provided by wound center, clear drainage occ   Past Surgical History:  Procedure Laterality Date   CARDIAC CATHETERIZATION  05/01/2012   grafts widely patent   CARDIOVASCULAR STRESS TEST  03-15-2010   INFERIOR WALL SCAR WITHOUT ANY MEANINGFUL ISCHEMIA/ EF 46% / LOW RISK SCAN   CATARACT EXTRACTION     CORONARY ARTERY BYPASS GRAFT  02-10-2008  DR Jacinto Halim   X4 VESSEL DISEASE / EMERGANT CABG   CYSTOSCOPY  02/08/2012   Procedure: CYSTOSCOPY FLEXIBLE;  Surgeon: Marcine Matar, MD;  Location: [facility];  Service: Urology;;   CYSTOSCOPY WITH RETROGRADE PYELOGRAM, URETEROSCOPY AND STENT PLACEMENT Right 09/12/2018   Procedure: CYSTOSCOPY WITH RETROGRADE PYELOGRAM, URETEROSCOPY AND STENT PLACEMENT, STONE EXTRACTION;  Surgeon: Marcine Matar, MD;  Location: [facility];  Service: Urology;  Laterality: Right;   EP IMPLANTABLE DEVICE N/A 08/14/2016   Procedure: Loop Recorder Insertion;  Surgeon: Marinus Maw, MD;  Location:  [facility];  Service: Cardiovascular;  Laterality: N/A;   EXTRACORPOREAL SHOCK WAVE LITHOTRIPSY  2013   HOLMIUM LASER APPLICATION Right 09/12/2018   Procedure: HOLMIUM LASER APPLICATION;  Surgeon: Marcine Matar, MD;  Location: [facility];  Service: Urology;  Laterality: Right;   KNEE SURGERY  age 30   RIGHT   LEFT HEART CATHETERIZATION WITH CORONARY/GRAFT ANGIOGRAM N/A 05/01/2012   Procedure: LEFT HEART CATHETERIZATION WITH Isabel Caprice;  Surgeon: Thurmon Fair, MD;  Location: [facility];  Service: Cardiovascular;  Laterality: N/A;   MULTIPLE TEETH EXTRACTIONS (23)/ FOUR QUADRANT ALVEOLPLASTY/ MANDIBULAR LATERAL EXOSTOSES REDUCTIONS  03-24-2010   CHRONIC PERIODONTITIS   RADIOACTIVE SEED IMPLANT  02/08/2012   Procedure: RADIOACTIVE SEED IMPLANT;  Surgeon: Marcine Matar, MD;  Location: [facility];  Service: Urology;  Laterality: N/A;  C-ARM    SHOULDER SURGERY  1982   LEFT   TEE WITHOUT CARDIOVERSION N/A 08/14/2016   Procedure: TRANSESOPHAGEAL ECHOCARDIOGRAM (TEE);  Surgeon: Wendall Stade,  MD;  Location: [facility];  Service: Cardiovascular;  Laterality: N/A;   TOTAL KNEE ARTHROPLASTY Right 11/27/2022   Procedure: TOTAL KNEE ARTHROPLASTY;  Surgeon: Dannielle Huh, MD;  Location: [facility];  Service: Orthopedics;  Laterality: Right;   URETERAL STENT PLACEMENT       Current Meds  Medication Sig   amiodarone (PACERONE) 200 MG tablet Take 2 tablets twice daily for 1 week, then 2 tablets once daily for a week, then 200 mg 1 tablet once daily thereafter   atorvastatin (LIPITOR) 10 MG tablet Take 1 tablet (10 mg total) by mouth daily.   blood glucose meter kit and supplies KIT Dispense based on patient and insurance preference.   Continuous Glucose Sensor (FREESTYLE LIBRE 14 DAY SENSOR) MISC Place 1 each onto the skin every 14 (fourteen) days.   dextromethorphan-guaiFENesin (MUCINEX DM) 30-600 MG 12hr tablet Take 1 tablet by mouth 2 (two) times daily.    furosemide (LASIX) 20 MG tablet Take 1 tablet (20 mg total) by mouth daily. (Patient taking differently: Take 2 tablets by mouth daily. 2 tabs(40mg ))   gabapentin (NEURONTIN) 600 MG tablet Two tabs by mouth twice daily   glipiZIDE (GLUCOTROL) 5 MG tablet Take 1 tablet (5 mg total) by mouth 2 (two) times daily before a meal.   glucose blood (FREESTYLE LITE) test strip Use to check blood sugars every morning fasting   mirabegron ER (MYRBETRIQ) 50 MG TB24 tablet Take 1 tablet (50 mg total) by mouth daily.   omeprazole (PRILOSEC) 20 MG capsule Take 1 capsule (20 mg total) by mouth daily. For indigestion and reflux.   Semaglutide (RYBELSUS) 3 MG TABS Take 1 tablet (3 mg total) by mouth daily.   tamsulosin (FLOMAX) 0.4 MG CAPS capsule Take 1 capsule (0.4 mg total) by mouth at bedtime.   [DISCONTINUED] apixaban (ELIQUIS) 5 MG TABS tablet Take 1 tablet (5 mg total) by mouth 2 (two) times daily.     Allergies:   Bee venom and Penicillins   Social History   Tobacco Use   Smoking status: Former    Packs/day: 0.50    Years: 50.00    Additional pack years: 0.00    Total pack years: 25.00    Types: Cigarettes    Passive exposure: Never   Smokeless tobacco: Never   Tobacco comments:    PREVIOUSLY SMOKED 3PPD / DECREASED TO 1PPD SINCE 2009  Vaping Use   Vaping Use: Never used  Substance Use Topics   Alcohol use: Not Currently    Alcohol/week: 2.0 standard drinks of alcohol    Types: 1 Cans of beer, 1 Shots of liquor per week   Drug use: No     Family Hx: The patient's family history includes Cancer in his father; Diabetes in his father.  ROS:   Please see the history of present illness.     All other systems reviewed and are negative.   Prior CV studies:   The following studies were reviewed today:  Echocardiogram 04/04/2023     1. Left ventricular ejection fraction, by estimation, is 40 to 45%. The  left ventricle has mildly decreased function. The left ventricle  demonstrates  global hypokinesis. Left ventricular diastolic parameters are  indeterminate.   2. Right ventricular systolic function is mildly reduced. The right  ventricular size is mildly enlarged. Tricuspid regurgitation signal is  inadequate for assessing PA pressure.   3. The mitral valve is normal in structure. Trivial mitral valve  regurgitation. No evidence of mitral stenosis.  4. The aortic valve was not well visualized. Aortic valve regurgitation  is trivial.   5. Aortic dilatation noted. There is mild dilatation of the ascending  aorta, measuring 42 mm.   Comparison(s): Prior images reviewed side by side. Aorta is now dilated. Similar function.    7-day arrhythmia monitor 03/19/2023    The dominant rhythm is normal sinus rhythm with normal circadian variation.   There is a high prevalence of paroxysmal atrial fibrillation, representing 42% of the entire week recording.  The longer episode was 2 days and 18 hours and was present at the time of monitoring initiation.  Ventricular rate control during atrial fibrillation is poor with average rates of 110-140 bpm   There are rare PVCs and there is a single 12 beat run of nonsustained ventricular tachycardia at 144 bpm.   There is no evidence of significant bradycardia or pauses.   Abnormal arrhythmia monitor due to the presence of paroxysmal atrial fibrillation with poor ventricular rate control. mildly   ABI 03/19/2023   ABI/TBIToday's ABIToday's TBIPrevious ABIPrevious TBI  +-------+-----------+-----------+------------+------------+  Right 1.17       0.93       1.11        0.90          +-------+-----------+-----------+------------+------------+  Left  0.74       0.29       0.72        0.33          +-------+-----------+-----------+------------+------------+     Bilateral ABIs and TBIs appear essentially unchanged compared to prior study on 03/28/21 AND 03/28/22.     Summary:  Right: Resting right ankle-brachial index is  within normal range. No evidence of significant right lower extremity arterial disease. The right toe-brachial index is normal.   Left: Resting left ankle-brachial index indicates moderate left lower extremity arterial disease. The left toe-brachial index is abnormal.   Labs/Other Tests and Data Reviewed:    EKG: Performed today shows this rhythm, left atrial abnormality, right bundle branch block (old, old inferior MI, possible old anterolateral MI with Q waves V2-V6.  Baseline QRS duration 150 ms, QTc 491 ms.  No acute ischemic repolarization abnormalities.    Recent Labs: 11/27/2022: ALT 23; Hemoglobin 13.4; Platelets 168 03/12/2023: BNP 289.6 03/29/2023: BUN 13; Creatinine, Ser 0.76; Potassium 4.2; Sodium 138   Recent Lipid Panel Lab Results  Component Value Date/Time   CHOL 77 (L) 03/21/2022 09:32 AM   TRIG 87 03/21/2022 09:32 AM   HDL 28 (L) 03/21/2022 09:32 AM   CHOLHDL 2.8 03/21/2022 09:32 AM   CHOLHDL 6.7 08/12/2016 12:48 AM   LDLCALC 31 03/21/2022 09:32 AM    Wt Readings from Last 3 Encounters:  04/24/23 229 lb (103.9 kg)  04/18/23 229 lb (103.9 kg)  03/29/23 228 lb (103.4 kg)     Objective:    Vital Signs:  BP 116/62   Pulse 63   Ht 5\' 10"  (1.778 m)   Wt 229 lb (103.9 kg)   BMI 32.86 kg/m      General: Alert, oriented x3, no distress, mildly obese Head: no evidence of trauma, PERRL, EOMI, no exophtalmos or lid lag, no myxedema, no xanthelasma; normal ears, nose and oropharynx Neck: normal jugular venous pulsations and no hepatojugular reflux; brisk carotid pulses without delay and no carotid bruits Chest: clear to auscultation, no signs of consolidation by percussion or palpation, normal fremitus, symmetrical and full respiratory excursions Cardiovascular: normal position and quality of the apical impulse, regular rhythm,  normal first and second heart sounds, no murmurs, rubs or gallops Abdomen: no tenderness or distention, no masses by palpation, no abnormal  pulsatility or arterial bruits, normal bowel sounds, no hepatosplenomegaly Extremities: Wearing compression stockings.  Bilateral lower extremity edema little worse on the right.  Scars of total knee replacement on the right.  Palpable pulses in both feet, although weaker on the left. Neurological: grossly nonfocal Psych: Normal mood and affect    ASSESSMENT & PLAN:    1. Chronic combined systolic and diastolic CHF, NYHA class 2 (HCC)   2. Paroxysmal atrial fibrillation (HCC)   3. Acquired thrombophilia (HCC)   4. On amiodarone therapy   5. Coronary artery disease involving native coronary artery of native heart without angina pectoris   6. Essential hypertension   7. Dyslipidemia (high LDL; low HDL)   8. Type 2 diabetes mellitus with obesity (HCC)   9. Peripheral venous insufficiency   10. PAD (peripheral artery disease) (HCC)              CHF: Improved.  Currently NYHA functional class I and clinically euvolemic.  Has longstanding issues with lower extremity edema due to peripheral venous insufficiency, compensated with compression stockings.  Only taking a very low-dose of oral loop diuretic.  Suspect decompensation may have been due to the higher burden of atrial fibrillation with rapid ventricular response. AFib: Although this is asymptomatic, the burden of atrial fibrillation seems to be higher and has had problems with rapid ventricular response.  This may explain recent problems with heart failure due to tachycardia cardiomyopathy, superimposed on previous coronary insufficiency related left ventricular dysfunction.  Started treatment with antiarrhythmics (amiodarone).  Remains on appropriate anticoagulation.    On anticoagulation.  CHADSVasc 6 (age, CVA 2, HTN, DM, CAD).  Eliquis: Remote history of nephrolithiasis related hematuria, no recent problems.   Amiodarone: Will check baseline liver and thyroid function tests and then continue checking every 6 months.  He is aware of the  need to protect himself from excessive sun.  Yearly eye exam.  Promptly report unexplained respiratory issues.  We have baseline pulmonary function tests from last June which showed mildly reduced FEV1 and mildly reduced FVC. CAD s/p CABG: He is very physically active and does not have angina pectoris. HTN:  He has never tolerated higher doses of beta-blocker, but is currently asymptomatic so we will keep on same medications. HLP: Has an excellent LDL but also very low HDL.  Unable to check lipid profile today since he just had a bacon and cheese sandwich. DM: Markedly improved on treatment with Rybelsus and glipizide no longer taking SGLT2 inhibitor. Tobacco abuse: Congratulated him on smoking cessation. Obesity: Remains mildly obese.  Encouraged weight loss. ILR:    Device is at end of service. Venous insufficiency of the lower extremities: Continue compression stockings and leg elevation. PAD LLE: occlusion left SFA and significant infrapopliteal disease.  Stable symptoms and ABI. Saw Dr. Gretta Began on April 15, 2019, conservative management recommended.  Told him to promptly report any wounds or ulcerations on his feet, especially if on the left side.   COVID-19 Education: The signs and symptoms of COVID-19 were discussed with the patient and how to seek care for testing (follow up with PCP or arrange E-visit).  The importance of social distancing was discussed today.  Time:   Today, I have spent 15 minutes with the patient with telehealth technology discussing the above problems.     Medication Adjustments/Labs and Tests Ordered: Current  medicines are reviewed at length with the patient today.  Concerns regarding medicines are outlined above.   Tests Ordered: Orders Placed This Encounter  Procedures   Comprehensive metabolic panel   TSH   EKG 12-Lead    Medication Changes: Meds ordered this encounter  Medications   apixaban (ELIQUIS) 5 MG TABS tablet    Sig: Take 1 tablet (5 mg  total) by mouth 2 (two) times daily.    Dispense:  180 tablet    Refill:  3    90 day supply    Follow Up:  In Person  1 year  Signed, Thurmon Fair, MD  04/24/2023 11:03 AM    [facility]

## 2023-05-28 ENCOUNTER — Encounter: Payer: PPO | Admitting: Family Medicine

## 2023-05-29 ENCOUNTER — Ambulatory Visit (INDEPENDENT_AMBULATORY_CARE_PROVIDER_SITE_OTHER): Payer: PPO

## 2023-05-29 VITALS — Ht 70.0 in | Wt 237.0 lb

## 2023-05-29 DIAGNOSIS — Z0000 Encounter for general adult medical examination without abnormal findings: Secondary | ICD-10-CM | POA: Diagnosis not present

## 2023-05-29 DIAGNOSIS — Z1159 Encounter for screening for other viral diseases: Secondary | ICD-10-CM

## 2023-05-29 NOTE — Patient Instructions (Addendum)
[name] , Thank you for taking time to come for your Medicare Wellness Visit. I appreciate your ongoing commitment to your health goals. Please review the following plan we discussed and let me know if I can assist you in the future.   These are the goals we discussed:  Goals       Lose weight (pt-stated)        This is a list of the screening recommended for you and due dates:  Health Maintenance  Topic Date Due   Hepatitis C Screening  Never done   Pneumonia Vaccine (2 of 2 - PPSV23 or PCV20) 01/22/2018   COVID-19 Vaccine (3 - Pfizer risk series) 03/01/2020   Zoster (Shingles) Vaccine (2 of 2) 10/03/2022   Flu Shot  07/26/2023   Screening for Lung Cancer  08/12/2023   Eye exam for diabetics  08/19/2023   Hemoglobin A1C  09/07/2023   Yearly kidney health urinalysis for diabetes  01/24/2024   Complete foot exam   04/17/2024   Yearly kidney function blood test for diabetes  04/23/2024   Medicare Annual Wellness Visit  05/28/2024   DTaP/Tdap/Td vaccine (2 - Td or Tdap) 01/08/2028   Colon Cancer Screening  03/11/2029   HPV Vaccine  Aged Out    Advanced directives: Advance directive discussed with you today. Even though you declined this today, please call our office should you change your mind, and we can give you the proper paperwork for you to fill out.   Conditions/risks identified: None  Next appointment: Follow up in one year for your annual wellness visit.   Preventive Care 71 Years and Older, Male  Preventive care refers to lifestyle choices and visits with your health care provider that can promote health and wellness. What does preventive care include? A yearly physical exam. This is also called an annual well check. Dental exams once or twice a year. Routine eye exams. Ask your health care provider how often you should have your eyes checked. Personal lifestyle choices, including: Daily care of your teeth and gums. Regular physical activity. Eating a healthy  diet. Avoiding tobacco and drug use. Limiting alcohol use. Practicing safe sex. Taking low doses of aspirin every day. Taking vitamin and mineral supplements as recommended by your health care provider. What happens during an annual well check? The services and screenings done by your health care provider during your annual well check will depend on your age, overall health, lifestyle risk factors, and family history of disease. Counseling  Your health care provider may ask you questions about your: Alcohol use. Tobacco use. Drug use. Emotional well-being. Home and relationship well-being. Sexual activity. Eating habits. History of falls. Memory and ability to understand (cognition). Work and work Astronomer. Screening  You may have the following tests or measurements: Height, weight, and BMI. Blood pressure. Lipid and cholesterol levels. These may be checked every 5 years, or more frequently if you are over 49 years old. Skin check. Lung cancer screening. You may have this screening every year starting at age 33 if you have a 30-pack-year history of smoking and currently smoke or have quit within the past 15 years. Fecal occult blood test (FOBT) of the stool. You may have this test every year starting at age 57. Flexible sigmoidoscopy or colonoscopy. You may have a sigmoidoscopy every 5 years or a colonoscopy every 10 years starting at age 38. Prostate cancer screening. Recommendations will vary depending on your family history and other risks. Hepatitis C blood  test. Hepatitis B blood test. Sexually transmitted disease (STD) testing. Diabetes screening. This is done by checking your blood sugar (glucose) after you have not eaten for a while (fasting). You may have this done every 1-3 years. Abdominal aortic aneurysm (AAA) screening. You may need this if you are a current or former smoker. Osteoporosis. You may be screened starting at age 33 if you are at high risk. Talk with  your health care provider about your test results, treatment options, and if necessary, the need for more tests. Vaccines  Your health care provider may recommend certain vaccines, such as: Influenza vaccine. This is recommended every year. Tetanus, diphtheria, and acellular pertussis (Tdap, Td) vaccine. You may need a Td booster every 10 years. Zoster vaccine. You may need this after age 82. Pneumococcal 13-valent conjugate (PCV13) vaccine. One dose is recommended after age 45. Pneumococcal polysaccharide (PPSV23) vaccine. One dose is recommended after age 65. Talk to your health care provider about which screenings and vaccines you need and how often you need them. This information is not intended to replace advice given to you by your health care provider. Make sure you discuss any questions you have with your health care provider. Document Released: 01/07/2016 Document Revised: 08/30/2016 Document Reviewed: 10/12/2015 Elsevier Interactive Patient Education  2017 [employer].  Fall Prevention in the Home Falls can cause injuries. They can happen to people of all ages. There are many things you can do to make your home safe and to help prevent falls. What can I do on the outside of my home? Regularly fix the edges of walkways and driveways and fix any cracks. Remove anything that might make you trip as you walk through a door, such as a raised step or threshold. Trim any bushes or trees on the path to your home. Use bright outdoor lighting. Clear any walking paths of anything that might make someone trip, such as rocks or tools. Regularly check to see if handrails are loose or broken. Make sure that both sides of any steps have handrails. Any raised decks and porches should have guardrails on the edges. Have any leaves, snow, or ice cleared regularly. Use sand or salt on walking paths during winter. Clean up any spills in your garage right away. This includes oil or grease spills. What  can I do in the bathroom? Use night lights. Install grab bars by the toilet and in the tub and shower. Do not use towel bars as grab bars. Use non-skid mats or decals in the tub or shower. If you need to sit down in the shower, use a plastic, non-slip stool. Keep the floor dry. Clean up any water that spills on the floor as soon as it happens. Remove soap buildup in the tub or shower regularly. Attach bath mats securely with double-sided non-slip rug tape. Do not have throw rugs and other things on the floor that can make you trip. What can I do in the bedroom? Use night lights. Make sure that you have a light by your bed that is easy to reach. Do not use any sheets or blankets that are too big for your bed. They should not hang down onto the floor. Have a firm chair that has side arms. You can use this for support while you get dressed. Do not have throw rugs and other things on the floor that can make you trip. What can I do in the kitchen? Clean up any spills right away. Avoid walking on wet  floors. Keep items that you use a lot in easy-to-reach places. If you need to reach something above you, use a strong step stool that has a grab bar. Keep electrical cords out of the way. Do not use floor polish or wax that makes floors slippery. If you must use wax, use non-skid floor wax. Do not have throw rugs and other things on the floor that can make you trip. What can I do with my stairs? Do not leave any items on the stairs. Make sure that there are handrails on both sides of the stairs and use them. Fix handrails that are broken or loose. Make sure that handrails are as long as the stairways. Check any carpeting to make sure that it is firmly attached to the stairs. Fix any carpet that is loose or worn. Avoid having throw rugs at the top or bottom of the stairs. If you do have throw rugs, attach them to the floor with carpet tape. Make sure that you have a light switch at the top of the  stairs and the bottom of the stairs. If you do not have them, ask someone to add them for you. What else can I do to help prevent falls? Wear shoes that: Do not have high heels. Have rubber bottoms. Are comfortable and fit you well. Are closed at the toe. Do not wear sandals. If you use a stepladder: Make sure that it is fully opened. Do not climb a closed stepladder. Make sure that both sides of the stepladder are locked into place. Ask someone to hold it for you, if possible. Clearly mark and make sure that you can see: Any grab bars or handrails. First and last steps. Where the edge of each step is. Use tools that help you move around (mobility aids) if they are needed. These include: Canes. Walkers. Scooters. Crutches. Turn on the lights when you go into a dark area. Replace any light bulbs as soon as they burn out. Set up your furniture so you have a clear path. Avoid moving your furniture around. If any of your floors are uneven, fix them. If there are any pets around you, be aware of where they are. Review your medicines with your doctor. Some medicines can make you feel dizzy. This can increase your chance of falling. Ask your doctor what other things that you can do to help prevent falls. This information is not intended to replace advice given to you by your health care provider. Make sure you discuss any questions you have with your health care provider. Document Released: 10/07/2009 Document Revised: 05/18/2016 Document Reviewed: 01/15/2015 Elsevier Interactive Patient Education  2017 [employer].

## 2023-05-29 NOTE — Progress Notes (Signed)
Subjective:   [name] is a 72 y.o. male who presents for Medicare Annual/Subsequent preventive examination.  Review of Systems    Virtual Visit via Telephone Note  I connected with  [name] on 05/29/23 at  9:00 AM EDT by telephone and verified that I am speaking with the correct person using two identifiers.  Location: Patient: Home Provider: Office Persons participating in the virtual visit: patient/Nurse Health Advisor   I discussed the limitations, risks, security and privacy concerns of performing an evaluation and management service by telephone and the availability of in person appointments. The patient expressed understanding and agreed to proceed.  Interactive audio and video telecommunications were attempted between this nurse and patient, however failed, due to patient having technical difficulties OR patient did not have access to video capability.  We continued and completed visit with audio only.  Some vital signs may be absent or patient reported.   Tillie Rung, LPN  Cardiac Risk Factors include: advanced age (>60men, >38 women);male gender;diabetes mellitus;hypertension;dyslipidemia     Objective:    Today's Vitals   05/29/23 0900 05/29/23 0903  Weight: 237 lb (107.5 kg)   Height: 5\' 10"  (1.778 m)   PainSc:  0-No pain   Body mass index is 34.01 kg/m.     05/29/2023    9:10 AM 11/27/2022    5:42 AM 11/14/2022    9:07 AM 03/27/2021    8:17 AM 04/25/2019   10:47 PM 04/15/2019   10:10 AM 09/12/2018    9:01 AM  Advanced Directives  Does Patient Have a Medical Advance Directive? No No No No No Yes No  Type of Careers adviser;Living will   Does patient want to make changes to medical advance directive?     No - Patient declined No - Patient declined   Would patient like information on creating a medical advance directive? No - Patient declined No - Patient declined   No - Patient declined  No - Patient declined     Current Medications (verified) Outpatient Encounter Medications as of 05/29/2023  Medication Sig   acetaminophen (TYLENOL) 500 MG tablet Take 1,000 mg by mouth every 8 (eight) hours as needed for moderate pain. (Patient not taking: Reported on 04/24/2023)   amiodarone (PACERONE) 200 MG tablet Take 2 tablets twice daily for 1 week, then 2 tablets once daily for a week, then 200 mg 1 tablet once daily thereafter   apixaban (ELIQUIS) 5 MG TABS tablet Take 1 tablet (5 mg total) by mouth 2 (two) times daily.   atorvastatin (LIPITOR) 10 MG tablet Take 1 tablet (10 mg total) by mouth daily.   blood glucose meter kit and supplies KIT Dispense based on patient and insurance preference.   Continuous Glucose Sensor (FREESTYLE LIBRE 14 DAY SENSOR) MISC Place 1 each onto the skin every 14 (fourteen) days.   dextromethorphan-guaiFENesin (MUCINEX DM) 30-600 MG 12hr tablet Take 1 tablet by mouth 2 (two) times daily.   furosemide (LASIX) 20 MG tablet Take 1 tablet (20 mg total) by mouth daily. (Patient taking differently: Take 2 tablets by mouth daily. 2 tabs(40mg ))   gabapentin (NEURONTIN) 600 MG tablet Two tabs by mouth twice daily   glipiZIDE (GLUCOTROL) 5 MG tablet Take 1 tablet (5 mg total) by mouth 2 (two) times daily before a meal.   glucose blood (FREESTYLE LITE) test strip Use to check blood sugars every morning fasting   mirabegron ER (  MYRBETRIQ) 50 MG TB24 tablet Take 1 tablet (50 mg total) by mouth daily.   Multiple Vitamin (MULTIVITAMIN WITH MINERALS) TABS tablet Take 1 tablet by mouth daily. Men's One-A-Day Multivitamin (Patient not taking: Reported on 04/24/2023)   omeprazole (PRILOSEC) 20 MG capsule Take 1 capsule (20 mg total) by mouth daily. For indigestion and reflux.   Semaglutide (RYBELSUS) 3 MG TABS Take 1 tablet (3 mg total) by mouth daily.   tamsulosin (FLOMAX) 0.4 MG CAPS capsule Take 1 capsule (0.4 mg total) by mouth at bedtime.   No facility-administered encounter medications on file  as of 05/29/2023.    Allergies (verified) Bee venom and Penicillins   History: Past Medical History:  Diagnosis Date   Abdominal aortic aneurysm (AAA) (HCC)    3 cm per 07-02-18 US abdominal aorta US epic   Arthritis    Blood clot in vein    behind left knee   Chronic venous insufficiency LOWER EXTREMITIES   Coronary artery disease CARDIOLOGIST - DR  [id number]-  LAST 1 WK AGO -- WILL REQUEST NOTE AND STRESS TEST   Diabetes mellitus without complication (HCC)    type 2   Hematuria    last year   History of kidney stones    Hyperglycemia    Hyperlipidemia    Hypertension    Mixed dyslipidemia    Myocardial infarction (HCC)    Neuropathy    toes only   Nocturia    Prostate cancer (HCC) 08/18/2011   gleason 8, volume 24.4cc   S/P CABG x 4    ST elevation MI (STEMI) (HCC) 02/10/2008   S/P CABG   Stroke (HCC) 2016   occ trouble wriing with right hand   Urinary hesitancy    Vision abnormalities    resolved now   Wound discharge    since jan 2019 goes to wound center [facility] left great toe small open area changes dressing q 2 days with ointment provided by wound center, clear drainage occ   Past Surgical History:  Procedure Laterality Date   CARDIAC CATHETERIZATION  05/01/2012   grafts widely patent   CARDIOVASCULAR STRESS TEST  03-15-2010   INFERIOR WALL SCAR WITHOUT ANY MEANINGFUL ISCHEMIA/ EF 46% / LOW RISK SCAN   CATARACT EXTRACTION     CORONARY ARTERY BYPASS GRAFT  02-10-2008  DR Jacinto Halim   X4 VESSEL DISEASE / EMERGANT CABG   CYSTOSCOPY  02/08/2012   Procedure: CYSTOSCOPY FLEXIBLE;  Surgeon: Marcine Matar, MD;  Location: [facility];  Service: Urology;;   CYSTOSCOPY WITH RETROGRADE PYELOGRAM, URETEROSCOPY AND STENT PLACEMENT Right 09/12/2018   Procedure: CYSTOSCOPY WITH RETROGRADE PYELOGRAM, URETEROSCOPY AND STENT PLACEMENT, STONE EXTRACTION;  Surgeon: Marcine Matar, MD;  Location: [facility];  Service: Urology;  Laterality: Right;   EP IMPLANTABLE  DEVICE N/A 08/14/2016   Procedure: Loop Recorder Insertion;  Surgeon: Marinus Maw, MD;  Location: [facility];  Service: Cardiovascular;  Laterality: N/A;   EXTRACORPOREAL SHOCK WAVE LITHOTRIPSY  2013   HOLMIUM LASER APPLICATION Right 09/12/2018   Procedure: HOLMIUM LASER APPLICATION;  Surgeon: Marcine Matar, MD;  Location: [facility];  Service: Urology;  Laterality: Right;   KNEE SURGERY  age 54   RIGHT   LEFT HEART CATHETERIZATION WITH CORONARY/GRAFT ANGIOGRAM N/A 05/01/2012   Procedure: LEFT HEART CATHETERIZATION WITH Isabel Caprice;  Surgeon: Thurmon Fair, MD;  Location: [facility];  Service: Cardiovascular;  Laterality: N/A;   MULTIPLE TEETH EXTRACTIONS (23)/ FOUR QUADRANT ALVEOLPLASTY/ MANDIBULAR LATERAL EXOSTOSES REDUCTIONS  03-24-2010  CHRONIC PERIODONTITIS   RADIOACTIVE SEED IMPLANT  02/08/2012   Procedure: RADIOACTIVE SEED IMPLANT;  Surgeon: Marcine Matar, MD;  Location: [facility];  Service: Urology;  Laterality: N/A;  C-ARM    SHOULDER SURGERY  1982   LEFT   TEE WITHOUT CARDIOVERSION N/A 08/14/2016   Procedure: TRANSESOPHAGEAL ECHOCARDIOGRAM (TEE);  Surgeon: Wendall Stade, MD;  Location: [facility];  Service: Cardiovascular;  Laterality: N/A;   TOTAL KNEE ARTHROPLASTY Right 11/27/2022   Procedure: TOTAL KNEE ARTHROPLASTY;  Surgeon: Dannielle Huh, MD;  Location: [facility];  Service: Orthopedics;  Laterality: Right;   URETERAL STENT PLACEMENT     Family History  Problem Relation Age of Onset   Cancer Father        pancreatic   Diabetes Father    Social History   Socioeconomic History   Marital status: Married    Spouse name: Not on file   Number of children: Not on file   Years of education: Not on file   Highest education level: Not on file  Occupational History   Not on file  Tobacco Use   Smoking status: Former    Packs/day: 0.50    Years: 50.00    Additional pack years: 0.00    Total pack years: 25.00    Types: Cigarettes     Passive exposure: Never   Smokeless tobacco: Never   Tobacco comments:    PREVIOUSLY SMOKED 3PPD / DECREASED TO 1PPD SINCE 2009  Vaping Use   Vaping Use: Never used  Substance and Sexual Activity   Alcohol use: Not Currently    Alcohol/week: 2.0 standard drinks of alcohol    Types: 1 Cans of beer, 1 Shots of liquor per week   Drug use: No   Sexual activity: Not Currently  Other Topics Concern   Not on file  Social History Narrative   Not on file   Social Determinants of Health   Financial Resource Strain: Low Risk  (05/29/2023)   Overall Financial Resource Strain (CARDIA)    Difficulty of Paying Living Expenses: Not hard at all  Food Insecurity: No Food Insecurity (05/29/2023)   Hunger Vital Sign    Worried About Running Out of Food in the Last Year: Never true    Ran Out of Food in the Last Year: Never true  Transportation Needs: No Transportation Needs (05/29/2023)   PRAPARE - Administrator, Civil Service (Medical): No    Lack of Transportation (Non-Medical): No  Physical Activity: Inactive (05/29/2023)   Exercise Vital Sign    Days of Exercise per Week: 0 days    Minutes of Exercise per Session: 0 min  Stress: No Stress Concern Present (05/29/2023)   [employer] of Occupational Health - Occupational Stress Questionnaire    Feeling of Stress : Not at all  Social Connections: Socially Integrated (05/29/2023)   Social Connection and Isolation Panel [NHANES]    Frequency of Communication with Friends and Family: More than three times a week    Frequency of Social Gatherings with Friends and Family: More than three times a week    Attends Religious Services: More than 4 times per year    Active Member of [employer] or Organizations: Yes    Attends Engineer, structural: More than 4 times per year    Marital Status: Married    Tobacco Counseling Counseling given: Not Answered Tobacco comments: PREVIOUSLY SMOKED 3PPD / DECREASED TO 1PPD SINCE  2009   Clinical Intake:  Pre-visit preparation completed: No  Pain : No/denies pain Pain Score: 0-No pain   Nutrition Risk Assessment:  Has the patient had any N/V/D within the last 2 months?  No  Does the patient have any non-healing wounds?  No  Has the patient had any unintentional weight loss or weight gain?  No   Diabetes:  Is the patient diabetic?  Yes  If diabetic, was a CBG obtained today?  No  Did the patient bring in their glucometer from home?  No  How often do you monitor your CBG's? PRN.   Financial Strains and Diabetes Management:  Are you having any financial strains with the device, your supplies or your medication? No .  Does the patient want to be seen by Chronic Care Management for management of their diabetes?  No  Would the patient like to be referred to a Nutritionist or for Diabetic Management?  No   Diabetic Exams:  Diabetic Eye Exam: Completed . Overdue for diabetic eye exam. Pt has been advised about the importance in completing this exam. A referral has been placed today. Message sent to referral coordinator for scheduling purposes. Advised pt to expect a call from office referred to regarding appt.  Diabetic Foot Exam: Completed . Pt has been advised about the importance in completing this exam. Pt is scheduled for diabetic foot exam on Followed by PCP.    BMI - recorded: 34.01 Nutritional Status: BMI > 30  Obese Nutritional Risks: None Diabetes: Yes CBG done?: No Did pt. bring in CBG monitor from home?: No  How often do you need to have someone help you when you read instructions, pamphlets, or other written materials from your doctor or pharmacy?: 1 - Never  Diabetic?  Yes  Interpreter Needed?: No  Information entered by :: Theresa Mulligan LPN   Activities of Daily Living    05/29/2023    9:09 AM 11/27/2022    7:40 PM  In your present state of health, do you have any difficulty performing the following activities:  Hearing? 0 1   Vision? 0 0  Difficulty concentrating or making decisions? 0 0  Walking or climbing stairs? 0 1  Dressing or bathing? 0 0  Doing errands, shopping? 0 0  Preparing Food and eating ? N   Using the Toilet? N   In the past six months, have you accidently leaked urine? N   Do you have problems with loss of bowel control? N   Managing your Medications? N   Managing your Finances? N   Housekeeping or managing your Housekeeping? N     Patient Care Team: Melida Quitter, PA as PCP - General (Family Medicine) Croitoru, Rachelle Hora, MD as PCP - Cardiology (Cardiology) Judyann Munson, MD as Consulting Physician (Infectious Diseases) Currence, Vladimir Crofts, PA-C (Orthopedic Surgery)  Indicate any recent Medical Services you may have received from other than [facility] providers in the past year (date may be approximate).     Assessment:   This is a routine wellness examination for [name].  Hearing/Vision screen Hearing Screening - Comments:: Denies hearing difficulties   Vision Screening - Comments:: Wears rx glasses - up to date with routine eye exams with  Deferred  Dietary issues and exercise activities discussed: Current Exercise Habits: The patient does not participate in regular exercise at present, Exercise limited by: None identified   Goals Addressed               This Visit's Progress  Lose weight (pt-stated)         Depression Screen    05/29/2023    9:08 AM 03/07/2023   10:11 AM 01/23/2023    8:44 AM 10/23/2022    9:45 AM 09/25/2022    8:29 AM 06/16/2022    9:56 AM 03/21/2022    9:04 AM  PHQ 2/9 Scores  PHQ - 2 Score 0 0 1 0 0 0 0  PHQ- 9 Score   3 0 0 0 1    Fall Risk    05/29/2023    9:10 AM 01/23/2023    8:45 AM 09/25/2022    8:29 AM 03/21/2022    9:03 AM 11/16/2021    9:07 AM  Fall Risk   Falls in the past year? 0 0 0 0 1  Number falls in past yr: 0 0 0 0 0  Injury with Fall? 0 0 0 0 0  Risk for fall due to : No Fall Risks  No Fall Risks No Fall Risks History of  fall(s)  Follow up Falls prevention discussed  Falls evaluation completed Falls evaluation completed Falls evaluation completed    FALL RISK PREVENTION PERTAINING TO THE HOME:  Any stairs in or around the home? Yes  If so, are there any without handrails? No  Home free of loose throw rugs in walkways, pet beds, electrical cords, etc? Yes  Adequate lighting in your home to reduce risk of falls? Yes   ASSISTIVE DEVICES UTILIZED TO PREVENT FALLS:  Life alert? No  Use of a cane, walker or w/c? No  Grab bars in the bathroom? Yes  Shower chair or bench in shower? No  Elevated toilet seat or a handicapped toilet? No   TIMED UP AND GO:  Was the test performed? No . Audio Visit   Cognitive Function:        05/29/2023    9:10 AM 03/21/2022    8:48 AM 06/20/2018    8:26 AM  6CIT Screen  What Year? 0 points 0 points 0 points  What month? 0 points 0 points 0 points  What time? 0 points 0 points 0 points  Count back from 20 0 points 0 points 0 points  Months in reverse 0 points 0 points 0 points  Repeat phrase 0 points 6 points 2 points  Total Score 0 points 6 points 2 points    Immunizations Immunization History  Administered Date(s) Administered   Fluad Quad(high Dose 65+) 10/20/2020, 11/16/2021   Influenza, High Dose Seasonal PF 11/27/2017, 01/21/2019   PFIZER(Purple Top)SARS-COV-2 Vaccination 01/14/2020, 02/02/2020   Pneumococcal Conjugate-13 11/27/2017   RSV,unspecified 08/08/2022   Tdap 01/07/2018   Zoster Recombinat (Shingrix) 08/08/2022    TDAP status: Up to date  Flu Vaccine status: Up to date  Pneumococcal vaccine status: Due, Education has been provided regarding the importance of this vaccine. Advised may receive this vaccine at local pharmacy or Health Dept. Aware to provide a copy of the vaccination record if obtained from local pharmacy or Health Dept. Verbalized acceptance and understanding.  Covid-19 vaccine status: Completed vaccines  Qualifies for  Shingles Vaccine? Yes   Zostavax completed No   Shingrix Completed?: No.    Education has been provided regarding the importance of this vaccine. Patient has been advised to call insurance company to determine out of pocket expense if they have not yet received this vaccine. Advised may also receive vaccine at local pharmacy or Health Dept. Verbalized acceptance and understanding.  Screening Tests Health Maintenance  Topic Date Due   Hepatitis C Screening  Never done   Pneumonia Vaccine 12+ Years old (2 of 2 - PPSV23 or PCV20) 01/22/2018   COVID-19 Vaccine (3 - Pfizer risk series) 03/01/2020   Zoster Vaccines- Shingrix (2 of 2) 10/03/2022   INFLUENZA VACCINE  07/26/2023   Lung Cancer Screening  08/12/2023   OPHTHALMOLOGY EXAM  08/19/2023   HEMOGLOBIN A1C  09/07/2023   Diabetic kidney evaluation - Urine ACR  01/24/2024   FOOT EXAM  04/17/2024   Diabetic kidney evaluation - eGFR measurement  04/23/2024   Medicare Annual Wellness (AWV)  05/28/2024   DTaP/Tdap/Td (2 - Td or Tdap) 01/08/2028   Colonoscopy  03/11/2029   HPV VACCINES  Aged Out    Health Maintenance  Health Maintenance Due  Topic Date Due   Hepatitis C Screening  Never done   Pneumonia Vaccine 62+ Years old (2 of 2 - PPSV23 or PCV20) 01/22/2018   COVID-19 Vaccine (3 - Pfizer risk series) 03/01/2020   Zoster Vaccines- Shingrix (2 of 2) 10/03/2022    Colorectal cancer screening: Type of screening: Colonoscopy. Completed 03/12/19. Repeat every 10 years  Lung Cancer Screening: (Low Dose CT Chest recommended if Age 21-80 years, 30 pack-year currently smoking OR have quit w/in 15years.) does qualify.   Lung Cancer Screening Referral: Deferred  Additional Screening:  Hepatitis C Screening: does qualify;  Deferred  Vision Screening: Recommended annual ophthalmology exams for early detection of glaucoma and other disorders of the eye. Is the patient up to date with their annual eye exam?  Yes  Who is the provider or what  is the name of the office in which the patient attends annual eye exams? Deferred If pt is not established with a provider, would they like to be referred to a provider to establish care? No .   Dental Screening: Recommended annual dental exams for proper oral hygiene  Community Resource Referral / Chronic Care Management:  CRR required this visit?  No   CCM required this visit?  No      Plan:     I have personally reviewed and noted the following in the patient's chart:   Medical and social history Use of alcohol, tobacco or illicit drugs  Current medications and supplements including opioid prescriptions. Patient is not currently taking opioid prescriptions. Functional ability and status Nutritional status Physical activity Advanced directives List of other physicians Hospitalizations, surgeries, and ER visits in previous 12 months Vitals Screenings to include cognitive, depression, and falls Referrals and appointments  In addition, I have reviewed and discussed with patient certain preventive protocols, quality metrics, and best practice recommendations. A written personalized care plan for preventive services as well as general preventive health recommendations were provided to patient.     Tillie Rung, LPN   12/30/1094   Nurse Notes: Patient due Hep-C Screening

## 2023-05-29 NOTE — Addendum Note (Signed)
Addended by: Saralyn Pilar on: 05/29/2023 12:15 PM   Modules accepted: Orders

## 2023-06-04 ENCOUNTER — Ambulatory Visit (INDEPENDENT_AMBULATORY_CARE_PROVIDER_SITE_OTHER): Payer: PPO

## 2023-06-04 ENCOUNTER — Ambulatory Visit
Admission: EM | Admit: 2023-06-04 | Discharge: 2023-06-04 | Disposition: A | Discharge status: Home / Self Care | Payer: PPO | Attending: Internal Medicine | Admitting: Internal Medicine

## 2023-06-04 DIAGNOSIS — R051 Acute cough: Secondary | ICD-10-CM

## 2023-06-04 DIAGNOSIS — J069 Acute upper respiratory infection, unspecified: Secondary | ICD-10-CM

## 2023-06-04 DIAGNOSIS — R059 Cough, unspecified: Secondary | ICD-10-CM | POA: Diagnosis not present

## 2023-06-04 MED ORDER — BENZONATATE 100 MG PO CAPS: 100.0000 mg | ORAL_CAPSULE | Freq: Three times a day (TID) | ORAL | 0 refills | Status: DC | PRN

## 2023-06-04 MED ORDER — DOXYCYCLINE HYCLATE 100 MG PO CAPS: 100.0000 mg | ORAL_CAPSULE | Freq: Two times a day (BID) | ORAL | 0 refills | Status: AC

## 2023-06-04 NOTE — Discharge Instructions (Signed)
I have prescribed you an antibiotic and a cough medication.  Follow-up if any symptoms persist or worsen.

## 2023-06-04 NOTE — ED Triage Notes (Signed)
Patient presents to UC for chest congestion, productive cough with yellowish phlegm, and sore throat x 1 week. Taking dayquil and nyquil.

## 2023-06-04 NOTE — ED Provider Notes (Signed)
[name]    CSN: [id number] Arrival date & time: 06/04/23  0941      History   Chief Complaint Chief Complaint  Patient presents with   Fatigue   Cough    HPI [name] is a 72 y.o. male.   Patient presents to urgent care with feelings of chest congestion, productive cough, nasal congestion, sore throat that started about 6 to 7 days ago.  Reports sore throat resolved but other symptoms are still present.  Cough is productive with white to yellow sputum.  He is taking DayQuil and NyQuil with minimal improvement in symptoms.  Denies any fever.  Reports that he does work at the [employer] so is not sure if he has had any known sick contacts.  Denies history of asthma or COPD.  Although, patient reports that he did smoke cigarettes in the past.   Cough   Past Medical History:  Diagnosis Date   Abdominal aortic aneurysm (AAA) (HCC)    3 cm per 07-02-18 US abdominal aorta US epic   Arthritis    Blood clot in vein    behind left knee   Chronic venous insufficiency LOWER EXTREMITIES   Coronary artery disease CARDIOLOGIST - DR  [id number]-  LAST 1 WK AGO -- WILL REQUEST NOTE AND STRESS TEST   Diabetes mellitus without complication (HCC)    type 2   Hematuria    last year   History of kidney stones    Hyperglycemia    Hyperlipidemia    Hypertension    Mixed dyslipidemia    Myocardial infarction (HCC)    Neuropathy    toes only   Nocturia    Prostate cancer (HCC) 08/18/2011   gleason 8, volume 24.4cc   S/P CABG x 4    ST elevation MI (STEMI) (HCC) 02/10/2008   S/P CABG   Stroke (HCC) 2016   occ trouble wriing with right hand   Urinary hesitancy    Vision abnormalities    resolved now   Wound discharge    since jan 2019 goes to wound center [place] left great toe small open area changes dressing q 2 days with ointment provided by wound center, clear drainage occ    Patient Active Problem List   Diagnosis Date Noted   Dyslipidemia (high  LDL; low HDL) 04/24/2023   Chronic combined systolic and diastolic CHF, NYHA class 2 (HCC) 04/24/2023   Hypertensive heart disease with heart failure (HCC) 03/07/2023   S/P total knee replacement 11/27/2022   Lung nodule 06/19/2022   Microalbuminuria due to type 2 diabetes mellitus (HCC) 04/19/2020   Diabetic polyneuropathy associated with type 2 diabetes mellitus (HCC) 04/19/2020   PAD (peripheral artery disease) (HCC) 01/28/2019   Great toe pain, left 10/21/2018   Paroxysmal atrial fibrillation (HCC) 11/22/2016   History of embolic stroke 11/22/2016   Type 2 diabetes mellitus with obesity (HCC) 11/22/2016   Venous insufficiency of both lower extremities 09/21/2016   Hypertension associated with diabetes (HCC)    Cerebral infarction (HCC) 08/11/2016   CAD (coronary artery disease) 09/23/2013   Morbid obesity (HCC) 09/23/2013   Mixed diabetic hyperlipidemia associated with type 2 diabetes mellitus (HCC) 09/23/2013   Malignant neoplasm of prostate (HCC) 08/18/2011   Myocardial infarction [facility])     Past Surgical History:  Procedure Laterality Date   CARDIAC CATHETERIZATION  05/01/2012   grafts widely patent   CARDIOVASCULAR STRESS TEST  03-15-2010   INFERIOR WALL SCAR WITHOUT ANY MEANINGFUL ISCHEMIA/  EF 46% / LOW RISK SCAN   CATARACT EXTRACTION     CORONARY ARTERY BYPASS GRAFT  02-10-2008  DR Jacinto Halim   X4 VESSEL DISEASE / EMERGANT CABG   CYSTOSCOPY  02/08/2012   Procedure: CYSTOSCOPY FLEXIBLE;  Surgeon: Marcine Matar, MD;  Location: [facility];  Service: Urology;;   CYSTOSCOPY WITH RETROGRADE PYELOGRAM, URETEROSCOPY AND STENT PLACEMENT Right 09/12/2018   Procedure: CYSTOSCOPY WITH RETROGRADE PYELOGRAM, URETEROSCOPY AND STENT PLACEMENT, STONE EXTRACTION;  Surgeon: Marcine Matar, MD;  Location: [facility];  Service: Urology;  Laterality: Right;   EP IMPLANTABLE DEVICE N/A 08/14/2016   Procedure: Loop Recorder Insertion;  Surgeon: Marinus Maw, MD;  Location: [facility]  INVASIVE CV LAB;  Service: Cardiovascular;  Laterality: N/A;   EXTRACORPOREAL SHOCK WAVE LITHOTRIPSY  2013   HOLMIUM LASER APPLICATION Right 09/12/2018   Procedure: HOLMIUM LASER APPLICATION;  Surgeon: Marcine Matar, MD;  Location: [facility];  Service: Urology;  Laterality: Right;   KNEE SURGERY  age 27   RIGHT   LEFT HEART CATHETERIZATION WITH CORONARY/GRAFT ANGIOGRAM N/A 05/01/2012   Procedure: LEFT HEART CATHETERIZATION WITH Isabel Caprice;  Surgeon: Thurmon Fair, MD;  Location: [facility] CATH LAB;  Service: Cardiovascular;  Laterality: N/A;   MULTIPLE TEETH EXTRACTIONS (23)/ FOUR QUADRANT ALVEOLPLASTY/ MANDIBULAR LATERAL EXOSTOSES REDUCTIONS  03-24-2010   CHRONIC PERIODONTITIS   RADIOACTIVE SEED IMPLANT  02/08/2012   Procedure: RADIOACTIVE SEED IMPLANT;  Surgeon: Marcine Matar, MD;  Location: [facility];  Service: Urology;  Laterality: N/A;  C-ARM    SHOULDER SURGERY  1982   LEFT   TEE WITHOUT CARDIOVERSION N/A 08/14/2016   Procedure: TRANSESOPHAGEAL ECHOCARDIOGRAM (TEE);  Surgeon: Wendall Stade, MD;  Location: [facility] ENDOSCOPY;  Service: Cardiovascular;  Laterality: N/A;   TOTAL KNEE ARTHROPLASTY Right 11/27/2022   Procedure: TOTAL KNEE ARTHROPLASTY;  Surgeon: Dannielle Huh, MD;  Location: [facility];  Service: Orthopedics;  Laterality: Right;   URETERAL STENT PLACEMENT         Home Medications    Prior to Admission medications   Medication Sig Start Date End Date Taking? Authorizing Provider  benzonatate (TESSALON) 100 MG capsule Take 1 capsule (100 mg total) by mouth every 8 (eight) hours as needed for cough. 06/04/23  Yes Alaena Strader, Acie Fredrickson, FNP  doxycycline (VIBRAMYCIN) 100 MG capsule Take 1 capsule (100 mg total) by mouth 2 (two) times daily for 7 days. 06/04/23 06/11/23 Yes Wilferd Ritson, Acie Fredrickson, FNP  acetaminophen (TYLENOL) 500 MG tablet Take 1,000 mg by mouth every 8 (eight) hours as needed for moderate pain. Patient not taking: Reported on 04/24/2023    [provider]  amiodarone (PACERONE) 200 MG tablet Take 2 tablets twice daily for 1 week, then 2 tablets once daily for a week, then 200 mg 1 tablet once daily thereafter 04/04/23   Croitoru, Mihai, MD  apixaban (ELIQUIS) 5 MG TABS tablet Take 1 tablet (5 mg total) by mouth 2 (two) times daily. 04/24/23   Croitoru, Mihai, MD  atorvastatin (LIPITOR) 10 MG tablet Take 1 tablet (10 mg total) by mouth daily. 03/07/23   Melida Quitter, PA  blood glucose meter kit and supplies KIT Dispense based on patient and insurance preference. 10/20/20   Mayer Masker, PA-C  Continuous Glucose Sensor (FREESTYLE LIBRE 14 DAY SENSOR) MISC Place 1 each onto the skin every 14 (fourteen) days. 08/30/22   [provider]  dextromethorphan-guaiFENesin (MUCINEX DM) 30-600 MG 12hr tablet Take 1 tablet by mouth 2 (two) times daily. 02/07/23   Fayrene Fearing,  Onalee Hua, PA-C  furosemide (LASIX) 20 MG tablet Take 1 tablet (20 mg total) by mouth daily. Patient taking differently: Take 2 tablets by mouth daily. 2 tabs(40mg ) 03/07/23   Melida Quitter, PA  gabapentin (NEURONTIN) 600 MG tablet Two tabs by mouth twice daily 04/03/23   Saralyn Pilar A, PA  glipiZIDE (GLUCOTROL) 5 MG tablet Take 1 tablet (5 mg total) by mouth 2 (two) times daily before a meal. 03/07/23   Saralyn Pilar A, PA  glucose blood (FREESTYLE LITE) test strip Use to check blood sugars every morning fasting 04/02/18   Danford, Orpha Bur D, NP  mirabegron ER (MYRBETRIQ) 50 MG TB24 tablet Take 1 tablet (50 mg total) by mouth daily. 03/29/23   Carman Ching, PA-C  Multiple Vitamin (MULTIVITAMIN WITH MINERALS) TABS tablet Take 1 tablet by mouth daily. Men's One-A-Day Multivitamin Patient not taking: Reported on 04/24/2023    [provider]  omeprazole (PRILOSEC) 20 MG capsule Take 1 capsule (20 mg total) by mouth daily. For indigestion and reflux. 02/07/23   Ellsworth Lennox, PA-C  Semaglutide (RYBELSUS) 3 MG TABS Take 1 tablet (3 mg total) by mouth daily. 04/18/23    Melida Quitter, PA  tamsulosin (FLOMAX) 0.4 MG CAPS capsule Take 1 capsule (0.4 mg total) by mouth at bedtime. 03/07/23   Melida Quitter, PA    Family History Family History  Problem Relation Age of Onset   Cancer Father        pancreatic   Diabetes Father     Social History Social History   Tobacco Use   Smoking status: Former    Packs/day: 0.50    Years: 50.00    Additional pack years: 0.00    Total pack years: 25.00    Types: Cigarettes    Passive exposure: Never   Smokeless tobacco: Never   Tobacco comments:    PREVIOUSLY SMOKED 3PPD / DECREASED TO 1PPD SINCE 2009  Vaping Use   Vaping Use: Never used  Substance Use Topics   Alcohol use: Not Currently    Alcohol/week: 2.0 standard drinks of alcohol    Types: 1 Cans of beer, 1 Shots of liquor per week   Drug use: No     Allergies   Bee venom and Penicillins   Review of Systems Review of Systems Per HPI  Physical Exam Triage Vital Signs ED Triage Vitals [06/04/23 1036]  Enc Vitals Group     BP 130/78     Pulse Rate 73     Resp 16     Temp 97.8 F (36.6 C)     Temp Source Oral     SpO2 96 %     Weight      Height      Head Circumference      Peak Flow      Pain Score      Pain Loc      Pain Edu?      Excl. in GC?    No data found.  Updated Vital Signs BP 130/78 (BP Location: Left Arm)   Pulse 73   Temp 97.8 F (36.6 C) (Oral)   Resp 16   SpO2 96%   Visual Acuity Right Eye Distance:   Left Eye Distance:   Bilateral Distance:    Right Eye Near:   Left Eye Near:    Bilateral Near:     Physical Exam Constitutional:      General: He is not in acute distress.    Appearance:  Normal appearance. He is not toxic-appearing or diaphoretic.  HENT:     Head: Normocephalic and atraumatic.     Right Ear: Tympanic membrane and ear canal normal.     Left Ear: Tympanic membrane and ear canal normal.     Nose: Congestion present.     Mouth/Throat:     Mouth: Mucous membranes are moist.      Pharynx: No posterior oropharyngeal erythema.  Eyes:     Extraocular Movements: Extraocular movements intact.     Conjunctiva/sclera: Conjunctivae normal.     Pupils: Pupils are equal, round, and reactive to light.  Cardiovascular:     Rate and Rhythm: Normal rate and regular rhythm.     Pulses: Normal pulses.     Heart sounds: Normal heart sounds.  Pulmonary:     Effort: Pulmonary effort is normal. No respiratory distress.     Breath sounds: No stridor. Rhonchi present. No wheezing or rales.     Comments: Mild rhonchi noted bilaterally to auscultation. Abdominal:     General: Abdomen is flat. Bowel sounds are normal.     Palpations: Abdomen is soft.  Musculoskeletal:        General: Normal range of motion.     Cervical back: Normal range of motion.  Skin:    General: Skin is warm and dry.  Neurological:     General: No focal deficit present.     Mental Status: He is alert and oriented to person, place, and time. Mental status is at baseline.  Psychiatric:        Mood and Affect: Mood normal.        Behavior: Behavior normal.      UC Treatments / Results  Labs (all labs ordered are listed, but only abnormal results are displayed) Labs Reviewed - No data to display  EKG   Radiology DG Chest 2 View  Result Date: 06/04/2023 CLINICAL DATA:  Cough.  Chest congestion. EXAM: CHEST - 2 VIEW COMPARISON:  Chest radiographs 12/29/2011 and 03/16/2008; CT chest 08/11/2022 and 04/24/2022; PET-CT 05/18/2022 FINDINGS: Status post median sternotomy and CABG. Cardiac silhouette is mildly enlarged. Anterior left chest wall cardiac loop recorder is new from prior. Mediastinal contours are within normal limits. Mild bilateral interstitial thickening is similar to priors, chronic. Lucencies within the bilateral upper lungs consistent with the centrilobular emphysematous changes seen on prior CT. There is a somewhat linear density within the posterior superolateral right lung that appears  unchanged from 04/24/2022 and 08/11/2022 CTs and consistent with the linear nodular density followed on 08/11/2022 and 04/24/2022 radiographs. No new focal airspace opacity is seen. No pleural effusion pneumothorax. Moderate multilevel degenerative disc changes of the thoracic spine. IMPRESSION: 1. No acute lung process. 2. Stable mild cardiomegaly. 3. Stable linear nodular density within the posterior superolateral right lung. Please note the prior 08/11/2022 CT impression stated "consider additional follow-up in 6 months or resumption of annual low-dose CT lung cancer screening." Electronically Signed   By: Neita Garnet M.D.   On: 06/04/2023 11:48    Procedures Procedures (including critical care time)  Medications Ordered in UC Medications - No data to display  Initial Impression / Assessment and Plan / UC Course  I have reviewed the triage vital signs and the nursing notes.  Pertinent labs & imaging results that were available during my care of the patient were reviewed by me and considered in my medical decision making (see chart for details).     Suspect possible viral illness that  has been persistent, therefore there is concern for secondary bacterial infection.  Covid testing deferred given duration of symptoms as it would not change treatment. Chest x-ray completed given adventitious lung sounds and this was negative for any acute process.  It did note previous stable chronic findings and patient is having follow-up with PCP for this.  Will prescribe doxycycline to cover for atypicals especially given patient was a prior cigarette smoker.  Benzonatate prescribed to take as needed for cough as well.  Vital signs and patient stable at discharge with no tachypnea.  Advised strict return precautions.  Patient verbalized understanding and was agreeable with plan. Final Clinical Impressions(s) / UC Diagnoses   Final diagnoses:  Acute upper respiratory infection  Acute cough     Discharge  Instructions      I have prescribed you an antibiotic and a cough medication.  Follow-up if any symptoms persist or worsen.     ED Prescriptions     Medication Sig Dispense Auth. Provider   doxycycline (VIBRAMYCIN) 100 MG capsule Take 1 capsule (100 mg total) by mouth 2 (two) times daily for 7 days. 14 capsule Buchanan, Padroni E, [place]   benzonatate (TESSALON) 100 MG capsule Take 1 capsule (100 mg total) by mouth every 8 (eight) hours as needed for cough. 21 capsule [place], Acie Fredrickson, [place]      PDMP not reviewed this encounter.   Gustavus Bryant, [place] 06/04/23 1201

## 2023-06-08 ENCOUNTER — Other Ambulatory Visit: Payer: Self-pay | Admitting: Nurse Practitioner

## 2023-06-13 NOTE — Progress Notes (Signed)
Cardiology Clinic Note   Patient Name: [name] Date of Encounter: 06/19/2023  Primary Care Provider:  Melida Quitter, PA Primary Cardiologist:  Thurmon Fair, MD  Patient Profile    72 y.o. male with heart failure with mildly depressed left ventricular systolic function, coronary artery disease, CABG 4 in 2009, prior inferior wall infarction, atrial fibrillation CHADS VASC score of 6 with history of CAD and depressed LV systolic function and his QT interval, the only appropriate antiarrhythmic was amiodarone- ECG shows a relatively long QTc interval at about 480-490 ms.), hypertension, hyperlipidemia, type 2 diabetes mellitus, venous insufficiency of the lower extremities, previous right frontal cortical infarct, PAD of lower extremities with significant arterial disease in the left leg, ABI 0.77, occlusion of SFA, brief reconstitution and occlusion of mid popliteal and tibioperoneal trunk and peroneal, posterior tibial mid-calf reconstitution (duplex US 02/2019, unchanged in 02/2020).   Past Medical History    Past Medical History:  Diagnosis Date   Abdominal aortic aneurysm (AAA) (HCC)    3 cm per 07-02-18 US abdominal aorta US epic   Arthritis    Blood clot in vein    behind left knee   Chronic venous insufficiency LOWER EXTREMITIES   Coronary artery disease CARDIOLOGIST - DR  [id number]  LAST 1 WK AGO -- WILL REQUEST NOTE AND STRESS TEST   Diabetes mellitus without complication (HCC)    type 2   Hematuria    last year   History of kidney stones    Hyperglycemia    Hyperlipidemia    Hypertension    Mixed dyslipidemia    Myocardial infarction (HCC)    Neuropathy    toes only   Nocturia    Prostate cancer (HCC) 08/18/2011   gleason 8, volume 24.4cc   S/P CABG x 4    ST elevation MI (STEMI) (HCC) 02/10/2008   S/P CABG   Stroke (HCC) 2016   occ trouble wriing with right hand   Urinary hesitancy    Vision abnormalities    resolved now   Wound discharge    since  jan 2019 goes to wound center [place] left great toe small open area changes dressing q 2 days with ointment provided by wound center, clear drainage occ   Past Surgical History:  Procedure Laterality Date   CARDIAC CATHETERIZATION  05/01/2012   grafts widely patent   CARDIOVASCULAR STRESS TEST  03-15-2010   INFERIOR WALL SCAR WITHOUT ANY MEANINGFUL ISCHEMIA/ EF 46% / LOW RISK SCAN   CATARACT EXTRACTION     CORONARY ARTERY BYPASS GRAFT  02-10-2008  DR Jacinto Halim   X4 VESSEL DISEASE / EMERGANT CABG   CYSTOSCOPY  02/08/2012   Procedure: CYSTOSCOPY FLEXIBLE;  Surgeon: Marcine Matar, MD;  Location: [facility];  Service: Urology;;   CYSTOSCOPY WITH RETROGRADE PYELOGRAM, URETEROSCOPY AND STENT PLACEMENT Right 09/12/2018   Procedure: CYSTOSCOPY WITH RETROGRADE PYELOGRAM, URETEROSCOPY AND STENT PLACEMENT, STONE EXTRACTION;  Surgeon: Marcine Matar, MD;  Location: [facility];  Service: Urology;  Laterality: Right;   EP IMPLANTABLE DEVICE N/A 08/14/2016   Procedure: Loop Recorder Insertion;  Surgeon: Marinus Maw, MD;  Location: [facility];  Service: Cardiovascular;  Laterality: N/A;   EXTRACORPOREAL SHOCK WAVE LITHOTRIPSY  2013   HOLMIUM LASER APPLICATION Right 09/12/2018   Procedure: HOLMIUM LASER APPLICATION;  Surgeon: Marcine Matar, MD;  Location: [facility];  Service: Urology;  Laterality: Right;   KNEE SURGERY  age 65   RIGHT   LEFT HEART CATHETERIZATION WITH  CORONARY/GRAFT ANGIOGRAM N/A 05/01/2012   Procedure: LEFT HEART CATHETERIZATION WITH Isabel Caprice;  Surgeon: Thurmon Fair, MD;  Location: [facility];  Service: Cardiovascular;  Laterality: N/A;   MULTIPLE TEETH EXTRACTIONS (23)/ FOUR QUADRANT ALVEOLPLASTY/ MANDIBULAR LATERAL EXOSTOSES REDUCTIONS  03-24-2010   CHRONIC PERIODONTITIS   RADIOACTIVE SEED IMPLANT  02/08/2012   Procedure: RADIOACTIVE SEED IMPLANT;  Surgeon: Marcine Matar, MD;  Location: [facility];  Service: Urology;   Laterality: N/A;  C-ARM    SHOULDER SURGERY  1982   LEFT   TEE WITHOUT CARDIOVERSION N/A 08/14/2016   Procedure: TRANSESOPHAGEAL ECHOCARDIOGRAM (TEE);  Surgeon: Wendall Stade, MD;  Location: [facility];  Service: Cardiovascular;  Laterality: N/A;   TOTAL KNEE ARTHROPLASTY Right 11/27/2022   Procedure: TOTAL KNEE ARTHROPLASTY;  Surgeon: Dannielle Huh, MD;  Location: [facility];  Service: Orthopedics;  Laterality: Right;   URETERAL STENT PLACEMENT      Allergies  Allergies  Allergen Reactions   Bee Venom Anaphylaxis   Penicillins Other (See Comments)    CAUSES "FREE BLEEDING"  Has patient had a PCN reaction causing immediate rash, facial/tongue/throat swelling, SOB or lightheadedness with hypotension: No  Has patient had a PCN reaction causing severe rash involving mucus membranes or skin necrosis: No  Has patient had a PCN reaction that required hospitalization No  Has patient had a PCN reaction occurring within the last 10 years: No  If all of the above answers are "NO", then may proceed with Cephalosporin use.  Other Reaction(s): Other (See Comments)  CAUSES "FREE BLEEDING", Has patient had a PCN reaction causing immediate rash, facial/tongue/throat swelling, SOB or lightheadedness with hypotension: No, Has patient had a PCN reaction causing severe rash involving mucus membranes or skin necrosis: No, Has patient had a PCN reaction that required hospitalization No, Has patient had a PCN reaction occurring within the last 10 years: No, If all of the above answers are "NO", then may proceed with Cephalosporin use.    History of Present Illness    Mr. Beeck returns today for 59-month follow-up for ongoing assessment and management of multiple cardiovascular issues as described above.  Last seen by Dr. Royann Shivers on 04/24/2023 and was relatively stable, without issues of bleeding on anticoagulation, was well compensated concerning volume overload, tolerating amiodarone for atrial fibrillation  and rate control.  Has had recently had upper respiratory infection and seen in ED on 06/04/2023.   He denies any chest pain, shortness of breath, dizziness.  He admits to chronic lower extremity edema, and that his blood sugar has not been well-controlled lately.  When reviewing his diet there are multiple dietary indiscretions concerning salt and carbohydrates.  With it being summertime he is eating a lot of tomato sandwiches with salt-and-pepper, he eats bacon, sausage, and salty snacks.  He is medically compliant.  He complains sometimes his right foot gets numb when he is driving for long periods of time.  If he lifts his foot and flexes his feet the sensation resolves. He remains active around his home, he works in his garden, out in his shop, he states he is getting good response from his Lasix when he takes it.   Home Medications    Current Outpatient Medications  Medication Sig Dispense Refill   amiodarone (PACERONE) 200 MG tablet Take 2 tablets twice daily for 1 week, then 2 tablets once daily for a week, then 200 mg 1 tablet once daily thereafter 90 tablet 3   apixaban (ELIQUIS) 5 MG TABS tablet Take  1 tablet (5 mg total) by mouth 2 (two) times daily. 180 tablet 3   atorvastatin (LIPITOR) 10 MG tablet Take 1 tablet (10 mg total) by mouth daily. 90 tablet 1   blood glucose meter kit and supplies KIT Dispense based on patient and insurance preference. 1 each 0   Continuous Glucose Sensor (FREESTYLE LIBRE 14 DAY SENSOR) MISC USE AS DIRECTED 6 each 0   gabapentin (NEURONTIN) 600 MG tablet Two tabs by mouth twice daily 180 tablet 3   glipiZIDE (GLUCOTROL) 5 MG tablet Take 1 tablet (5 mg total) by mouth 2 (two) times daily before a meal. 90 tablet 0   glucose blood (FREESTYLE LITE) test strip Use to check blood sugars every morning fasting 100 each 12   lisinopril (ZESTRIL) 5 MG tablet Take 5 mg by mouth daily.     mirabegron ER (MYRBETRIQ) 50 MG TB24 tablet Take 1 tablet (50 mg total) by  mouth daily. 30 tablet 11   Multiple Vitamin (MULTIVITAMIN WITH MINERALS) TABS tablet Take 1 tablet by mouth daily. Men's One-A-Day Multivitamin     acetaminophen (TYLENOL) 500 MG tablet Take 1,000 mg by mouth every 8 (eight) hours as needed for moderate pain. (Patient not taking: Reported on 04/24/2023)     benzonatate (TESSALON) 100 MG capsule Take 1 capsule (100 mg total) by mouth every 8 (eight) hours as needed for cough. (Patient not taking: Reported on 06/19/2023) 21 capsule 0   dextromethorphan-guaiFENesin (MUCINEX DM) 30-600 MG 12hr tablet Take 1 tablet by mouth 2 (two) times daily. (Patient not taking: Reported on 06/19/2023) 20 tablet 0   furosemide (LASIX) 20 MG tablet Take 1 tablet (20 mg total) by mouth as directed. Take 20 mg on  Mon., Wens., Friday. Take 40 mg Tues.,Thurs.,Sat., On Sunday Take 20 mg. 240 tablet 2   omeprazole (PRILOSEC) 20 MG capsule Take 1 capsule (20 mg total) by mouth daily. For indigestion and reflux. (Patient not taking: Reported on 06/19/2023) 30 capsule 0   Semaglutide (RYBELSUS) 3 MG TABS Take 1 tablet (3 mg total) by mouth daily. 30 tablet 0   tamsulosin (FLOMAX) 0.4 MG CAPS capsule Take 1 capsule (0.4 mg total) by mouth at bedtime. (Patient not taking: Reported on 06/19/2023) 90 capsule 1   No current facility-administered medications for this visit.     Family History    Family History  Problem Relation Age of Onset   Cancer Father        pancreatic   Diabetes Father    He indicated that his mother is alive. He indicated that his father is deceased.  Social History    Social History   Socioeconomic History   Marital status: Married    Spouse name: Not on file   Number of children: Not on file   Years of education: Not on file   Highest education level: Not on file  Occupational History   Not on file  Tobacco Use   Smoking status: Former    Packs/day: 0.50    Years: 50.00    Additional pack years: 0.00    Total pack years: 25.00    Types:  Cigarettes    Passive exposure: Never   Smokeless tobacco: Never   Tobacco comments:    PREVIOUSLY SMOKED 3PPD / DECREASED TO 1PPD SINCE 2009  Vaping Use   Vaping Use: Never used  Substance and Sexual Activity   Alcohol use: Not Currently    Alcohol/week: 2.0 standard drinks of alcohol    Types:  1 Cans of beer, 1 Shots of liquor per week   Drug use: No   Sexual activity: Not Currently  Other Topics Concern   Not on file  Social History Narrative   Not on file   Social Determinants of Health   Financial Resource Strain: Low Risk  (05/29/2023)   Overall Financial Resource Strain (CARDIA)    Difficulty of Paying Living Expenses: Not hard at all  Food Insecurity: No Food Insecurity (05/29/2023)   Hunger Vital Sign    Worried About Running Out of Food in the Last Year: Never true    Ran Out of Food in the Last Year: Never true  Transportation Needs: No Transportation Needs (05/29/2023)   PRAPARE - Administrator, Civil Service (Medical): No    Lack of Transportation (Non-Medical): No  Physical Activity: Inactive (05/29/2023)   Exercise Vital Sign    Days of Exercise per Week: 0 days    Minutes of Exercise per Session: 0 min  Stress: No Stress Concern Present (05/29/2023)   [employer] of Occupational Health - Occupational Stress Questionnaire    Feeling of Stress : Not at all  Social Connections: Socially Integrated (05/29/2023)   Social Connection and Isolation Panel [NHANES]    Frequency of Communication with Friends and Family: More than three times a week    Frequency of Social Gatherings with Friends and Family: More than three times a week    Attends Religious Services: More than 4 times per year    Active Member of [employer] or Organizations: Yes    Attends Engineer, structural: More than 4 times per year    Marital Status: Married  Catering manager Violence: Not At Risk (05/29/2023)   Humiliation, Afraid, Rape, and Kick questionnaire    Fear of Current or  Ex-Partner: No    Emotionally Abused: No    Physically Abused: No    Sexually Abused: No     Review of Systems    General:  No chills, fever, night sweats or weight changes.  Cardiovascular:  No chest pain, dyspnea on exertion, edema, orthopnea, palpitations, paroxysmal nocturnal dyspnea. Dermatological: No rash, lesions/masses Respiratory: No cough, dyspnea Urologic: No hematuria, dysuria Abdominal:   No nausea, vomiting, diarrhea, bright red blood per rectum, melena, or hematemesis Neurologic:  No visual changes, wkns, changes in mental status.  Numbness and tingling in his right foot while driving. All other systems reviewed and are otherwise negative except as noted above.     Physical Exam    VS:  BP 130/72 (BP Location: Left Arm, Patient Position: Sitting, Cuff Size: Normal)   Pulse 63   Ht 5\' 11"  (1.803 m)   Wt 238 lb 12.8 oz (108.3 kg)   SpO2 95%   BMI 33.31 kg/m  , BMI Body mass index is 33.31 kg/m.     GEN: Well nourished, well developed, in no acute distress.  Central obesity HEENT: normal. Neck: Supple, no JVD, carotid bruits, or masses. Cardiac: RRR, no murmurs, rubs, or gallops. No clubbing, cyanosis, bilateral 2+ pretibial edema, shiny skin with venous stasis skin changes.  Radials/DP/PT 2+ and equal bilaterally.  Respiratory:  Respirations regular and unlabored, clear to auscultation bilaterally. GI: Soft, nontender, nondistended, BS + x 4. MS: no deformity or atrophy.  Right knee well-healed scar from knee replacement. Skin: warm and dry, no rash. Neuro:  Strength and sensation are intact. Psych: Normal affect.  Accessory Clinical Findings      Lab Results  Component Value Date   WBC 9.8 11/27/2022   HGB 13.4 11/27/2022   HCT 41.9 11/27/2022   MCV 91.3 11/27/2022   PLT 168 11/27/2022   Lab Results  Component Value Date   CREATININE 0.93 04/24/2023   BUN 16 04/24/2023   NA 138 04/24/2023   K 5.0 04/24/2023   CL 101 04/24/2023   CO2 24  04/24/2023   Lab Results  Component Value Date   ALT 13 04/24/2023   AST 12 04/24/2023   ALKPHOS 129 (H) 04/24/2023   BILITOT 0.5 04/24/2023   Lab Results  Component Value Date   CHOL 77 (L) 03/21/2022   HDL 28 (L) 03/21/2022   LDLCALC 31 03/21/2022   TRIG 87 03/21/2022   CHOLHDL 2.8 03/21/2022    Lab Results  Component Value Date   HGBA1C 11.8 03/07/2023     Review of Prior Studies: Echocardiogram 04/04/2023     1. Left ventricular ejection fraction, by estimation, is 40 to 45%. The  left ventricle has mildly decreased function. The left ventricle  demonstrates global hypokinesis. Left ventricular diastolic parameters are  indeterminate.   2. Right ventricular systolic function is mildly reduced. The right  ventricular size is mildly enlarged. Tricuspid regurgitation signal is  inadequate for assessing PA pressure.   3. The mitral valve is normal in structure. Trivial mitral valve  regurgitation. No evidence of mitral stenosis.   4. The aortic valve was not well visualized. Aortic valve regurgitation  is trivial.   5. Aortic dilatation noted. There is mild dilatation of the ascending  aorta, measuring 42 mm.   Comparison(s): Prior images reviewed side by side. Aorta is now dilated. Similar function.      7-day arrhythmia monitor 03/19/2023     The dominant rhythm is normal sinus rhythm with normal circadian variation.   There is a high prevalence of paroxysmal atrial fibrillation, representing 42% of the entire week recording.  The longer episode was 2 days and 18 hours and was present at the time of monitoring initiation.  Ventricular rate control during atrial fibrillation is poor with average rates of 110-140 bpm   There are rare PVCs and there is a single 12 beat run of nonsustained ventricular tachycardia at 144 bpm.   There is no evidence of significant bradycardia or pauses.   Abnormal arrhythmia monitor due to the presence of paroxysmal atrial fibrillation  with poor ventricular rate control. mildly    ABI 03/19/2023     ABI/TBIToday's ABIToday's TBIPrevious ABIPrevious TBI  +-------+-----------+-----------+------------+------------+  Right 1.17       0.93       1.11        0.90          +-------+-----------+-----------+------------+------------+  Left  0.74       0.29       0.72        0.33          +-------+-----------+-----------+------------+------------+     Bilateral ABIs and TBIs appear essentially unchanged compared to prior study on 03/28/21 AND 03/28/22.     Summary:  Right: Resting right ankle-brachial index is within normal range. No evidence of significant right lower extremity arterial disease. The right toe-brachial index is normal.   Left: Resting left ankle-brachial index indicates moderate left lower extremity arterial disease. The left toe-brachial index is abnormal.      Assessment & Plan   1.  Chronic systolic heart failure: Most recent echocardiogram reveals EF of 40 to 45%.  Unfortunately  he has not been as adherent to low-sodium diet and continued issues with chronic lower extremity edema.  I have counseled him on low-sodium diet and provided him with a list of salt rich foods which she is to avoid.  I am changing his Lasix to 20 mg daily alternating with 40 mg daily.  He was advised to take this on an empty stomach and wait 30 minutes before eating to have better bioavailability.  He will continue to weigh himself daily.  He will call us if he begins to have worsening symptoms of edema or shortness of breath.  2.  Coronary artery disease: History of CABG x 4 in 2009.  Denies any recurrent chest pain, only mild shortness of breath easily resolves with rest.  Continue aggressive secondary prevention.  Weight loss Mediterranean diet is recommended.  3.  Permanent atrial fibrillation: Heart rate is well-controlled today, he remains on amiodarone 200 mg daily along with apixaban 5 mg daily.  He is requesting  samples.  We do not have any at this time.  Once some is available we will let him know.  Most recent TSH was drawn on last office visit 04/24/2023 with a TSH of 4.2.    4.  Mixed hyperlipidemia: Remains on atorvastatin.  Goal of LDL less than 50 with diabetes and coronary artery bypass grafting.  Labs are completed by PCP.  Most recent blood draw available to review was in March 2023 with total cholesterol of 77, HDL of 28, triglycerides 87, LDL 31.  5.  Type 2 diabetes: Patient states that his blood glucose has not been well-controlled with fasting blood glucose in the mornings around 200.  He is being followed by PCP and was started on Rybelsus but is unable to afford it.  He is due to follow-up with his PCP within the next 2 weeks and will need further on other alternatives and management       Signed, Bettey Mare. Liborio Nixon, ANP, AACC   06/19/2023 9:20 AM      Office [phone] Fax [phone]  Notice: This dictation was prepared with Dragon dictation along with smaller phrase technology. Any transcriptional errors that result from this process are unintentional and may not be corrected upon review.

## 2023-06-19 ENCOUNTER — Ambulatory Visit: Payer: PPO | Attending: Adult Health | Admitting: Adult Health

## 2023-06-19 ENCOUNTER — Encounter: Payer: Self-pay | Admitting: Adult Health

## 2023-06-19 VITALS — BP 130/72 | HR 63 | Ht 71.0 in | Wt 238.8 lb

## 2023-06-19 DIAGNOSIS — R0609 Other forms of dyspnea: Secondary | ICD-10-CM | POA: Diagnosis not present

## 2023-06-19 DIAGNOSIS — I2510 Atherosclerotic heart disease of native coronary artery without angina pectoris: Secondary | ICD-10-CM | POA: Diagnosis not present

## 2023-06-19 DIAGNOSIS — I5022 Chronic systolic (congestive) heart failure: Secondary | ICD-10-CM

## 2023-06-19 DIAGNOSIS — Z7984 Long term (current) use of oral hypoglycemic drugs: Secondary | ICD-10-CM

## 2023-06-19 DIAGNOSIS — I10 Essential (primary) hypertension: Secondary | ICD-10-CM

## 2023-06-19 DIAGNOSIS — E7800 Pure hypercholesterolemia, unspecified: Secondary | ICD-10-CM | POA: Diagnosis not present

## 2023-06-19 DIAGNOSIS — E1169 Type 2 diabetes mellitus with other specified complication: Secondary | ICD-10-CM

## 2023-06-19 DIAGNOSIS — E669 Obesity, unspecified: Secondary | ICD-10-CM

## 2023-06-19 DIAGNOSIS — R600 Localized edema: Secondary | ICD-10-CM | POA: Diagnosis not present

## 2023-06-19 MED ORDER — FUROSEMIDE 20 MG PO TABS
20.0000 mg | ORAL_TABLET | ORAL | 2 refills | Status: AC
Start: 2023-06-19 — End: ?

## 2023-06-19 NOTE — Patient Instructions (Signed)
Medication Instructions:  Increase Lasix 20 mg ( Monday, Wednesday, Friday take 20 mg. On Tuesday, Thursday, Saturday Take 40 mg. On Sunday take 20 mg). *If you need a refill on your cardiac medications before your next appointment, please call your pharmacy*   Lab Work: No Labs If you have labs (blood work) drawn today and your tests are completely normal, you will receive your results only by: MyChart Message (if you have MyChart) OR A paper copy in the mail If you have any lab test that is abnormal or we need to change your treatment, we will call you to review the results.   Testing/Procedures: No Testing    Follow-Up: At [facility], you and your health needs are our priority.  As part of our continuing mission to provide you with exceptional heart care, we have created designated Provider Care Teams.  These Care Teams include your primary Cardiologist (physician) and Advanced Practice Providers (APPs -  Physician Assistants and Nurse Practitioners) who all work together to provide you with the care you need, when you need it.  We recommend signing up for the patient portal called "MyChart".  Sign up information is provided on this After Visit Summary.  MyChart is used to connect with patients for Virtual Visits (Telemedicine).  Patients are able to view lab/test results, encounter notes, upcoming appointments, etc.  Non-urgent messages can be sent to your provider as well.   To learn more about what you can do with MyChart, go to [email or web address].    Your next appointment:   6 month(s)  Provider:   Thurmon Fair, MD

## 2023-06-22 ENCOUNTER — Other Ambulatory Visit: Payer: Self-pay | Admitting: Family Medicine

## 2023-06-22 DIAGNOSIS — E1169 Type 2 diabetes mellitus with other specified complication: Secondary | ICD-10-CM

## 2023-06-22 MED ORDER — GLIPIZIDE 5 MG PO TABS
5.0000 mg | ORAL_TABLET | Freq: Two times a day (BID) | ORAL | 1 refills | Status: DC
Start: 2023-06-22 — End: 2023-07-18

## 2023-06-29 ENCOUNTER — Other Ambulatory Visit: Payer: Self-pay

## 2023-06-29 DIAGNOSIS — Z0000 Encounter for general adult medical examination without abnormal findings: Secondary | ICD-10-CM

## 2023-06-29 DIAGNOSIS — Z130 Encounter for screening for diseases of the blood and blood-forming organs and certain disorders involving the immune mechanism: Secondary | ICD-10-CM

## 2023-07-10 ENCOUNTER — Telehealth: Payer: Self-pay | Admitting: Cardiovascular Disease

## 2023-07-10 DIAGNOSIS — I480 Paroxysmal atrial fibrillation: Secondary | ICD-10-CM

## 2023-07-10 MED ORDER — APIXABAN 5 MG PO TABS
5.0000 mg | ORAL_TABLET | Freq: Two times a day (BID) | ORAL | 0 refills | Status: DC
Start: 2023-07-10 — End: 2023-07-24

## 2023-07-10 NOTE — Telephone Encounter (Signed)
Patient states he is in the donut hole for his medication. He states he needs to re apply for assistance. He states that he was denied before due to income, but he needs to reapply stating he and wife are in separate households and she manages one and he manages the other.  Please advise if this is what he needs (to reapply) and if OK for samples.He states the doctor told him to come get samples several times, but we were out.   Advised that we need to get paperwork asap as we cannot continue to give samples each time due to limit of medications.    Please advise

## 2023-07-10 NOTE — Telephone Encounter (Signed)
Ok to sample for a week. If he was denied patient assistance before, not sure if he will be approved now. Eliquis pt assistance for Medicare patients require them to have spent at least 3% of their annual household income on out of pocket rx costs. The application is found here if he would like to apply:  GreaterMargins.[email or web address].[email or web address]  Otherwise, other possible options are noted below:  Xarelto: available for $89/month for patients in the donut hole through the [facility] with Me Coverage Gap. Patients can call to enroll at 888-XARELTO [phone])  Dabigatran: Generic formulation of Pradaxa. If insurance does not cover well, there is a GoodRx coupon for ~$70/month.  Warfarin: Much cheaper but requires routine in office lab monitoring. These visits may have a copay. There are more drug interactions and dietary interactions with warfarin, and the medication is less effective than the above options.

## 2023-07-10 NOTE — Telephone Encounter (Signed)
Patient wants to know if he can get some Eliquis samples.

## 2023-07-10 NOTE — Telephone Encounter (Signed)
Spoke with patient regarding options.  He will pick up samples and copy of application.  Advised we need asap to see if will be approved or if denied will have to change medications. He states understanding.

## 2023-07-12 ENCOUNTER — Other Ambulatory Visit: Payer: PPO

## 2023-07-13 ENCOUNTER — Other Ambulatory Visit: Payer: PPO

## 2023-07-13 DIAGNOSIS — Z130 Encounter for screening for diseases of the blood and blood-forming organs and certain disorders involving the immune mechanism: Secondary | ICD-10-CM

## 2023-07-13 DIAGNOSIS — Z1159 Encounter for screening for other viral diseases: Secondary | ICD-10-CM | POA: Diagnosis not present

## 2023-07-13 DIAGNOSIS — Z1321 Encounter for screening for nutritional disorder: Secondary | ICD-10-CM | POA: Diagnosis not present

## 2023-07-13 DIAGNOSIS — Z13228 Encounter for screening for other metabolic disorders: Secondary | ICD-10-CM | POA: Diagnosis not present

## 2023-07-13 DIAGNOSIS — Z0000 Encounter for general adult medical examination without abnormal findings: Secondary | ICD-10-CM | POA: Diagnosis not present

## 2023-07-13 DIAGNOSIS — Z1329 Encounter for screening for other suspected endocrine disorder: Secondary | ICD-10-CM | POA: Diagnosis not present

## 2023-07-14 LAB — COMPREHENSIVE METABOLIC PANEL
ALT: 29 IU/L (ref 0–44)
AST: 21 IU/L (ref 0–40)
Albumin: 4.1 g/dL (ref 3.8–4.8)
Alkaline Phosphatase: 118 IU/L (ref 44–121)
BUN/Creatinine Ratio: 20 (ref 10–24)
BUN: 18 mg/dL (ref 8–27)
Bilirubin Total: 0.5 mg/dL (ref 0.0–1.2)
CO2: 20 mmol/L (ref 20–29)
Calcium: 9.4 mg/dL (ref 8.6–10.2)
Chloride: 104 mmol/L (ref 96–106)
Creatinine, Ser: 0.9 mg/dL (ref 0.76–1.27)
Globulin, Total: 3.3 g/dL (ref 1.5–4.5)
Glucose: 193 mg/dL — ABNORMAL HIGH (ref 70–99)
Potassium: 4.6 mmol/L (ref 3.5–5.2)
Sodium: 139 mmol/L (ref 134–144)
Total Protein: 7.4 g/dL (ref 6.0–8.5)
eGFR: 91 mL/min/{1.73_m2} (ref >59)

## 2023-07-14 LAB — LIPID PANEL
Chol/HDL Ratio: 2.9 ratio (ref 0.0–5.0)
Cholesterol, Total: 109 mg/dL (ref 100–199)
HDL: 37 mg/dL — ABNORMAL LOW (ref >39)
LDL Chol Calc (NIH): 53 mg/dL (ref 0–99)
Triglycerides: 98 mg/dL (ref 0–149)
VLDL Cholesterol Cal: 19 mg/dL (ref 5–40)

## 2023-07-14 LAB — TSH: TSH: 3.27 u[IU]/mL (ref 0.450–4.500)

## 2023-07-14 LAB — CBC WITH DIFFERENTIAL/PLATELET
Basophils Absolute: 0.1 10*3/uL (ref 0.0–0.2)
Basos: 1 %
EOS (ABSOLUTE): 0.2 10*3/uL (ref 0.0–0.4)
Eos: 3 %
Hematocrit: 42.1 % (ref 37.5–51.0)
Hemoglobin: 13.5 g/dL (ref 13.0–17.7)
Immature Grans (Abs): 0 10*3/uL (ref 0.0–0.1)
Immature Granulocytes: 0 %
Lymphocytes Absolute: 3.4 10*3/uL — ABNORMAL HIGH (ref 0.7–3.1)
Lymphs: 38 %
MCH: 27.2 pg (ref 26.6–33.0)
MCHC: 32.1 g/dL (ref 31.5–35.7)
MCV: 85 fL (ref 79–97)
Monocytes Absolute: 0.5 10*3/uL (ref 0.1–0.9)
Monocytes: 6 %
Neutrophils Absolute: 4.8 10*3/uL (ref 1.4–7.0)
Neutrophils: 52 %
Platelets: 207 10*3/uL (ref 150–450)
RBC: 4.96 x10E6/uL (ref 4.14–5.80)
RDW: 16.5 % — ABNORMAL HIGH (ref 11.6–15.4)
WBC: 9 10*3/uL (ref 3.4–10.8)

## 2023-07-14 LAB — HEMOGLOBIN A1C
Est. average glucose Bld gHb Est-mCnc: 255 mg/dL
Hgb A1c MFr Bld: 10.5 % — ABNORMAL HIGH (ref 4.8–5.6)

## 2023-07-14 LAB — HEPATITIS C ANTIBODY: Hep C Virus Ab: NONREACTIVE

## 2023-07-18 ENCOUNTER — Ambulatory Visit (INDEPENDENT_AMBULATORY_CARE_PROVIDER_SITE_OTHER): Payer: PPO | Admitting: Family Medicine

## 2023-07-18 ENCOUNTER — Encounter: Payer: Self-pay | Admitting: Family Medicine

## 2023-07-18 VITALS — BP 112/71 | HR 57 | Temp 97.9°F | Ht 71.0 in | Wt 244.5 lb

## 2023-07-18 DIAGNOSIS — E782 Mixed hyperlipidemia: Secondary | ICD-10-CM

## 2023-07-18 DIAGNOSIS — E1142 Type 2 diabetes mellitus with diabetic polyneuropathy: Secondary | ICD-10-CM | POA: Diagnosis not present

## 2023-07-18 DIAGNOSIS — I152 Hypertension secondary to endocrine disorders: Secondary | ICD-10-CM | POA: Diagnosis not present

## 2023-07-18 DIAGNOSIS — E1159 Type 2 diabetes mellitus with other circulatory complications: Secondary | ICD-10-CM | POA: Diagnosis not present

## 2023-07-18 DIAGNOSIS — E669 Obesity, unspecified: Secondary | ICD-10-CM | POA: Diagnosis not present

## 2023-07-18 DIAGNOSIS — E1169 Type 2 diabetes mellitus with other specified complication: Secondary | ICD-10-CM

## 2023-07-18 MED ORDER — RYBELSUS 3 MG PO TABS
3.0000 mg | ORAL_TABLET | Freq: Every day | ORAL | 0 refills | Status: DC
Start: 2023-07-18 — End: 2023-10-24

## 2023-07-18 MED ORDER — GABAPENTIN 600 MG PO TABS
1200.0000 mg | ORAL_TABLET | Freq: Two times a day (BID) | ORAL | 3 refills | Status: DC
Start: 2023-10-04 — End: 2023-10-24

## 2023-07-18 MED ORDER — GLIPIZIDE 10 MG PO TABS: 10.0000 mg | ORAL_TABLET | Freq: Two times a day (BID) | ORAL | 3 refills | Status: DC

## 2023-07-18 NOTE — Progress Notes (Signed)
Established Patient Office Visit  Subjective   Patient ID: [name], male    DOB: 08-07-1951  Age: 72 y.o. MRN: [id number]  Chief Complaint  Patient presents with   Diabetes    HPI [name] is a 72 y.o. male presenting today for follow up of hypertension, hyperlipidemia, diabetes. Hypertension:   Pt denies chest pain, SOB, dizziness, edema, syncope, fatigue or heart palpitations. Taking lisinopril, amiodarone, as needed furosemide, reports excellent compliance with treatment. Denies side effects. Hyperlipidemia: tolerating atorvastatin well with no myalgias or significant side effects.  The ASCVD Risk score (Arnett DK, et al., 2019) failed to calculate for the following reasons:   The patient has a prior MI or stroke diagnosis Diabetes: denies hypoglycemic events, wounds or sores that are not healing well, increased thirst or urination. Denies vision problems, eye exam completed 08/18/2022. Checking glucose at home, ranges have been around 222. Taking glipizide as prescribed without any side effects, he ran out of Rybelsus because it became too expensive and he is in the donut hole.   ROS Negative unless otherwise noted in HPI   Objective:     BP 112/71   Pulse (!) 57   Temp 97.9 F (36.6 C) (Oral)   Ht 5\' 11"  (1.803 m)   Wt 244 lb 8 oz (110.9 kg)   SpO2 95%   BMI 34.10 kg/m   Physical Exam Constitutional:      General: He is not in acute distress.    Appearance: Normal appearance.  HENT:     Head: Normocephalic and atraumatic.  Cardiovascular:     Rate and Rhythm: Normal rate and regular rhythm.     Heart sounds: Normal heart sounds. No murmur heard.    No friction rub. No gallop.  Pulmonary:     Effort: Pulmonary effort is normal. No respiratory distress.     Breath sounds: Normal breath sounds. No wheezing, rhonchi or rales.  Skin:    General: Skin is warm and dry.  Neurological:     Mental Status: He is alert and oriented to person, place, and  time.  Psychiatric:        Mood and Affect: Mood normal.      Assessment & Plan:  Hypertension associated with diabetes (HCC) Assessment & Plan: BP goal <130/80.  Blood pressure stable, at goal.  Continue follow-up with cardiology.  Continue ambulatory blood pressure monitoring.  Continue Lasix 40 mg daily, lisinopril 5 mg daily, amiodarone 5 mg daily.  Will continue to monitor.   Mixed diabetic hyperlipidemia associated with type 2 diabetes mellitus (HCC) Assessment & Plan: Last lipid panel: LDL 53, HDL 37, triglycerides 98.  CMP within normal limits.  Continue atorvastatin 10 mg.  Will continue to monitor, encouraged heart healthy diet low in saturated and trans fats.   Type 2 diabetes mellitus with obesity (HCC) Assessment & Plan: A1c 10.5, down from 11.8.  He was doing well on Rybelsus 3 mg, but he is now in the donut hole and cannot afford the medication.  He is continuing to take glipizide 5 mg twice daily.  We discussed resources such as the [employer] patient assistance program and [employer].  He is going to apply for their programs.  I advised him to let me know if he is still not able to get the Rybelsus prescription.  Follow-up in 3 months, we can also discuss potential injectable medications or more affordable options like metformin.  It also may be worthwhile to  consider seeing endocrinology as they may have better luck getting affordable diabetes medications even in the donut hole.  Orders: -     glipiZIDE; Take 1 tablet (10 mg total) by mouth 2 (two) times daily before a meal.  Dispense: 60 tablet; Refill: 3 -     Rybelsus; Take 1 tablet (3 mg total) by mouth daily.  Dispense: 30 tablet; Refill: 0  Diabetic polyneuropathy associated with type 2 diabetes mellitus (HCC) Assessment & Plan: Patient has found that gabapentin is more helpful than pregabalin.  Discontinue pregabalin.  Continue gabapentin 1200 mg twice daily.  Will continue to monitor.  Orders: -      Gabapentin; Take 2 tablets (1,200 mg total) by mouth 2 (two) times daily.  Dispense: 180 tablet; Refill: 3    Return in about 3 months (around 10/18/2023) for follow-up for HTN, HLD, DM, fasting blood work 1 week before.    Marcus Quitter, PA

## 2023-07-18 NOTE — Assessment & Plan Note (Signed)
Patient has found that gabapentin is more helpful than pregabalin.  Discontinue pregabalin.  Continue gabapentin 1200 mg twice daily.  Will continue to monitor.

## 2023-07-18 NOTE — Patient Instructions (Addendum)
[employer] Patient Assistance for Rybelsus: https://www.nov[employer].com/diabetes/help-with-costs/pap.html  The other prescription assistance for name brand medication is called [employer].You can find the application at their website. SymbolBlog.at  In the meantime, let's increase your dose of glipizide. You can finish what you currently have by taking 2 tablets twice daily before a meal, then pick up the prescription for 10 mg tablets that I sent in.  If you find that you still cannot get the Rybelsus, please send me a message or give me a call so that we can start another medicine for your blood sugars.

## 2023-07-18 NOTE — Assessment & Plan Note (Signed)
A1c 10.5, down from 11.8.  He was doing well on Rybelsus 3 mg, but he is now in the donut hole and cannot afford the medication.  He is continuing to take glipizide 5 mg twice daily.  We discussed resources such as the [employer] patient assistance program and [employer].  He is going to apply for their programs.  I advised him to let me know if he is still not able to get the Rybelsus prescription.  Follow-up in 3 months, we can also discuss potential injectable medications or more affordable options like metformin.  It also may be worthwhile to consider seeing endocrinology as they may have better luck getting affordable diabetes medications even in the donut hole.

## 2023-07-18 NOTE — Assessment & Plan Note (Signed)
BP goal <130/80.  Blood pressure stable, at goal.  Continue follow-up with cardiology.  Continue ambulatory blood pressure monitoring.  Continue Lasix 40 mg daily, lisinopril 5 mg daily, amiodarone 5 mg daily.  Will continue to monitor.

## 2023-07-18 NOTE — Assessment & Plan Note (Signed)
Last lipid panel: LDL 53, HDL 37, triglycerides 98.  CMP within normal limits.  Continue atorvastatin 10 mg.  Will continue to monitor, encouraged heart healthy diet low in saturated and trans fats.

## 2023-07-20 ENCOUNTER — Telehealth: Payer: Self-pay | Admitting: Emergency Medicine

## 2023-07-20 NOTE — Telephone Encounter (Signed)
Pt assistance application for Eliquis faxed

## 2023-07-24 ENCOUNTER — Telehealth: Payer: Self-pay | Admitting: Cardiovascular Disease

## 2023-07-24 DIAGNOSIS — I480 Paroxysmal atrial fibrillation: Secondary | ICD-10-CM

## 2023-07-24 MED ORDER — APIXABAN 5 MG PO TABS
5.0000 mg | ORAL_TABLET | Freq: Two times a day (BID) | ORAL | 0 refills | Status: DC
Start: 2023-07-24 — End: 2023-08-30

## 2023-07-24 NOTE — Telephone Encounter (Signed)
Please advise status on application or if samples can be given

## 2023-07-24 NOTE — Telephone Encounter (Signed)
Pt is requesting a callback from nurse to see if they've received any more samples of Eliquis in the office yet. Please advise

## 2023-07-24 NOTE — Telephone Encounter (Signed)
Call to patient. Advised  Application still pending.  Samples will be set at front desk.

## 2023-07-24 NOTE — Telephone Encounter (Signed)
Looks like [place] faxed the application on 7/26, would touch base with her for status updates. Ok to give a week of samples if needed while application is pending.

## 2023-08-09 MED ORDER — APIXABAN 5 MG PO TABS: 5.0000 mg | ORAL_TABLET | Freq: Two times a day (BID) | ORAL | 0 refills | Status: DC

## 2023-08-09 NOTE — Addendum Note (Signed)
Addended by: Scheryl Marten on: 08/09/2023 05:04 PM   Modules accepted: Orders

## 2023-08-09 NOTE — Telephone Encounter (Signed)
Informed the patient that Eliquis assistance was denied because documentatin of 3% out of pocket rx expenses, based on HH adjusted gross income, not met.  I spoke with [employer] rep- was told that the total needs to be $534.70 or more.  Pt will go to pharmacy tomorrow to get print out of expenses and will bring to office for [place] to fax in. He only has a few days left of Eliquis. Will have samples ready at the the front desk for him to pick up.   2 weeks worth of samples at front desk

## 2023-08-10 ENCOUNTER — Telehealth: Payer: Self-pay | Admitting: Cardiovascular Disease

## 2023-08-10 NOTE — Telephone Encounter (Signed)
Paper Work Dropped Off: pharmacy expense printout  Date: 08/10/2023  Location of paper:  Dr.Croitoru box

## 2023-08-13 ENCOUNTER — Telehealth: Payer: Self-pay

## 2023-08-13 NOTE — Telephone Encounter (Signed)
Called patient and informed him that paper work has been faxed to [employer].

## 2023-08-13 NOTE — Telephone Encounter (Signed)
Faxed prescription expenses to [facility]

## 2023-08-13 NOTE — Telephone Encounter (Signed)
Patient dropped off document  Patient Assistance forms , to be filled out by provider. Patient requested to send it back via Fax within 7-days. Document is located in providers tray at front office.Please advise at Mobile [phone] (mobile).

## 2023-08-16 ENCOUNTER — Telehealth: Payer: Self-pay

## 2023-08-16 NOTE — Telephone Encounter (Signed)
Fax was received for [employer] patient assistance program stating that patient is missing information on his application. He must provide the number of people living in his household. Patient states that he will contact [employer] to provide missing information.

## 2023-08-21 ENCOUNTER — Encounter: Payer: Self-pay | Admitting: Family Medicine

## 2023-08-21 DIAGNOSIS — H524 Presbyopia: Secondary | ICD-10-CM | POA: Diagnosis not present

## 2023-08-21 DIAGNOSIS — E119 Type 2 diabetes mellitus without complications: Secondary | ICD-10-CM | POA: Diagnosis not present

## 2023-08-21 DIAGNOSIS — H5212 Myopia, left eye: Secondary | ICD-10-CM | POA: Diagnosis not present

## 2023-08-21 LAB — HM DIABETES EYE EXAM

## 2023-08-30 ENCOUNTER — Ambulatory Visit: Payer: PPO | Admitting: Urology

## 2023-08-30 ENCOUNTER — Ambulatory Visit
Admission: RE | Admit: 2023-08-30 | Discharge: 2023-08-30 | Disposition: A | Discharge status: Home / Self Care | Payer: PPO | Source: Ambulatory Visit | Attending: Urology | Admitting: Urology

## 2023-08-30 ENCOUNTER — Ambulatory Visit
Admission: RE | Admit: 2023-08-30 | Discharge: 2023-08-30 | Disposition: A | Discharge status: Home / Self Care | Payer: PPO | Attending: Urology | Admitting: Urology

## 2023-08-30 ENCOUNTER — Telehealth: Payer: Self-pay | Admitting: Urology

## 2023-08-30 ENCOUNTER — Encounter: Payer: Self-pay | Admitting: Urology

## 2023-08-30 VITALS — BP 124/83 | HR 60

## 2023-08-30 DIAGNOSIS — N2889 Other specified disorders of kidney and ureter: Secondary | ICD-10-CM | POA: Diagnosis not present

## 2023-08-30 DIAGNOSIS — R350 Frequency of micturition: Secondary | ICD-10-CM

## 2023-08-30 DIAGNOSIS — R300 Dysuria: Secondary | ICD-10-CM

## 2023-08-30 DIAGNOSIS — N200 Calculus of kidney: Secondary | ICD-10-CM

## 2023-08-30 DIAGNOSIS — Z87442 Personal history of urinary calculi: Secondary | ICD-10-CM | POA: Diagnosis not present

## 2023-08-30 DIAGNOSIS — R109 Unspecified abdominal pain: Secondary | ICD-10-CM | POA: Diagnosis not present

## 2023-08-30 LAB — URINALYSIS, COMPLETE
Bilirubin, UA: NEGATIVE
Ketones, UA: NEGATIVE
Leukocytes,UA: NEGATIVE
Nitrite, UA: NEGATIVE
RBC, UA: NEGATIVE
Specific Gravity, UA: 1.02 (ref 1.005–1.030)
Urobilinogen, Ur: 1 mg/dL (ref 0.2–1.0)
pH, UA: 6 (ref 5.0–7.5)

## 2023-08-30 LAB — MICROSCOPIC EXAMINATION: Bacteria, UA: NONE SEEN

## 2023-08-30 LAB — BLADDER SCAN AMB NON-IMAGING

## 2023-08-30 MED ORDER — TROSPIUM CHLORIDE ER 60 MG PO CP24
1.0000 | ORAL_CAPSULE | Freq: Every day | ORAL | 3 refills | Status: DC
Start: 2023-08-30 — End: 2024-04-02

## 2023-08-30 NOTE — Progress Notes (Signed)
08/30/2023 1:58 PM   [name] Feb 21, 1951 [id number]  Referring provider: Melida Quitter, PA [address] [place],  [place] [address]  Urological history: 1. Prostate cancer -PSA (03/2023) <0.1 -prostate cancer s/p brachytherapy and ADT in 2019   2. BPH with LU TS -cysto (01/2022) - radiation changes -TRUS (01/2022) - 21 cc  3. Bladder stones -s/p cystolitholapaxy of bladder calcification in 2019  4.  Nephrolithiasis -s/p ESWL in 2014   Chief Complaint  Patient presents with   Urinary Frequency    HPI: [name] is a 72 y.o. male who presents today for frequent urination and unable to control bladder.  Previous records reviewed.   He is having issues with his penis not being long enough to urinate past his clothing.  He also has issues with urinary urgency, urge incontinence and frequency which was well-controlled with Myrbetriq, but unfortunately Myrbetriq is cost prohibitive.  He also has been having some back pain and discomfort with urination especially after riding his lawn mower and he is wondering if he has more stones.  Patient denies any modifying or aggravating factors.  Patient denies any recent UTI's, gross hematuria or suprapubic/flank pain.  Patient denies any fevers, chills, nausea or vomiting.    UA yellow clear, 2+ glucose, specific gravity 1.020, pH 6.0, 1+ protein, 0-5 WBCs, 0-2 RBCs and 0-10 epithelial cells  PVR 35 mL  KUB no stones seen.  Moderate stool burden.   PMH: Past Medical History:  Diagnosis Date   Abdominal aortic aneurysm (AAA) (HCC)    3 cm per 07-02-18 US abdominal aorta US epic   Arthritis    Blood clot in vein    behind left knee   Chronic venous insufficiency LOWER EXTREMITIES   Coronary artery disease CARDIOLOGIST - DR  VHQIONGE-  LAST 1 WK AGO -- WILL REQUEST NOTE AND STRESS TEST   Diabetes mellitus without complication (HCC)    type 2   Hematuria    last year   History of kidney stones     Hyperglycemia    Hyperlipidemia    Hypertension    Mixed dyslipidemia    Myocardial infarction (HCC)    Neuropathy    toes only   Nocturia    Prostate cancer (HCC) 08/18/2011   gleason 8, volume 24.4cc   S/P CABG x 4    ST elevation MI (STEMI) (HCC) 02/10/2008   S/P CABG   Stroke (HCC) 2016   occ trouble wriing with right hand   Urinary hesitancy    Vision abnormalities    resolved now   Wound discharge    since jan 2019 goes to wound center [facility] left great toe small open area changes dressing q 2 days with ointment provided by wound center, clear drainage occ    Surgical History: Past Surgical History:  Procedure Laterality Date   CARDIAC CATHETERIZATION  05/01/2012   grafts widely patent   CARDIOVASCULAR STRESS TEST  03-15-2010   INFERIOR WALL SCAR WITHOUT ANY MEANINGFUL ISCHEMIA/ EF 46% / LOW RISK SCAN   CATARACT EXTRACTION     CORONARY ARTERY BYPASS GRAFT  02-10-2008  DR Jacinto Halim   X4 VESSEL DISEASE / EMERGANT CABG   CYSTOSCOPY  02/08/2012   Procedure: CYSTOSCOPY FLEXIBLE;  Surgeon: Marcine Matar, MD;  Location: [name];  Service: Urology;;   CYSTOSCOPY WITH RETROGRADE PYELOGRAM, URETEROSCOPY AND STENT PLACEMENT Right 09/12/2018   Procedure: CYSTOSCOPY WITH RETROGRADE PYELOGRAM, URETEROSCOPY AND STENT PLACEMENT,  STONE EXTRACTION;  Surgeon: Marcine Matar, MD;  Location: [facility];  Service: Urology;  Laterality: Right;   EP IMPLANTABLE DEVICE N/A 08/14/2016   Procedure: Loop Recorder Insertion;  Surgeon: Marinus Maw, MD;  Location: [facility] INVASIVE [facility];  Service: Cardiovascular;  Laterality: N/A;   EXTRACORPOREAL SHOCK WAVE LITHOTRIPSY  2013   HOLMIUM LASER APPLICATION Right 09/12/2018   Procedure: HOLMIUM LASER APPLICATION;  Surgeon: Marcine Matar, MD;  Location: [facility];  Service: Urology;  Laterality: Right;   KNEE SURGERY  age 12   RIGHT   LEFT HEART CATHETERIZATION WITH CORONARY/GRAFT ANGIOGRAM N/A 05/01/2012   Procedure: LEFT HEART  CATHETERIZATION WITH Isabel Caprice;  Surgeon: Thurmon Fair, MD;  Location: [facility] CATH [facility];  Service: Cardiovascular;  Laterality: N/A;   MULTIPLE TEETH EXTRACTIONS (23)/ FOUR QUADRANT ALVEOLPLASTY/ MANDIBULAR LATERAL EXOSTOSES REDUCTIONS  03-24-2010   CHRONIC PERIODONTITIS   RADIOACTIVE SEED IMPLANT  02/08/2012   Procedure: RADIOACTIVE SEED IMPLANT;  Surgeon: Marcine Matar, MD;  Location: [facility];  Service: Urology;  Laterality: N/A;  C-ARM    SHOULDER SURGERY  1982   LEFT   TEE WITHOUT CARDIOVERSION N/A 08/14/2016   Procedure: TRANSESOPHAGEAL ECHOCARDIOGRAM (TEE);  Surgeon: Wendall Stade, MD;  Location: [facility];  Service: Cardiovascular;  Laterality: N/A;   TOTAL KNEE ARTHROPLASTY Right 11/27/2022   Procedure: TOTAL KNEE ARTHROPLASTY;  Surgeon: Dannielle Huh, MD;  Location: [facility];  Service: Orthopedics;  Laterality: Right;   URETERAL STENT PLACEMENT      Home Medications:  Allergies as of 08/30/2023       Reactions   Bee Venom Anaphylaxis   Penicillins Other (See Comments)   CAUSES "FREE BLEEDING" Has patient had a PCN reaction causing immediate rash, facial/tongue/throat swelling, SOB or lightheadedness with hypotension: No Has patient had a PCN reaction causing severe rash involving mucus membranes or skin necrosis: No Has patient had a PCN reaction that required hospitalization No Has patient had a PCN reaction occurring within the last 10 years: No If all of the above answers are "NO", then may proceed with Cephalosporin use. Other Reaction(s): Other (See Comments) CAUSES "FREE BLEEDING", Has patient had a PCN reaction causing immediate rash, facial/tongue/throat swelling, SOB or lightheadedness with hypotension: No, Has patient had a PCN reaction causing severe rash involving mucus membranes or skin necrosis: No, Has patient had a PCN reaction that required hospitalization No, Has patient had a PCN reaction occurring within the last 10 years: No, If  all of the above answers are "NO", then may proceed with Cephalosporin use.        Medication List        Accurate as of August 30, 2023  1:58 PM. If you have any questions, ask your nurse or doctor.          amiodarone 200 MG tablet Commonly known as: PACERONE Take 2 tablets twice daily for 1 week, then 2 tablets once daily for a week, then 200 mg 1 tablet once daily thereafter   apixaban 5 MG Tabs tablet Commonly known as: ELIQUIS Take 1 tablet (5 mg total) by mouth 2 (two) times daily. What changed: Another medication with the same name was removed. Continue taking this medication, and follow the directions you see here. Changed by: Michiel Cowboy   atorvastatin 10 MG tablet Commonly known as: LIPITOR Take 1 tablet (10 mg total) by mouth daily.   blood glucose meter kit and supplies Kit Dispense based on patient and insurance preference.   FreeStyle [employer]  14 Day Sensor Misc USE AS DIRECTED   furosemide 20 MG tablet Commonly known as: LASIX Take 1 tablet (20 mg total) by mouth as directed. Take 20 mg on  Mon., Wens., Friday. Take 40 mg Tues.,Thurs.,Sat., On Sunday Take 20 mg.   gabapentin 600 MG tablet Commonly known as: NEURONTIN Take 2 tablets (1,200 mg total) by mouth 2 (two) times daily. Start taking on: October 04, 2023   glipiZIDE 10 MG tablet Commonly known as: GLUCOTROL Take 1 tablet (10 mg total) by mouth 2 (two) times daily before a meal.   glucose blood test strip Commonly known as: FREESTYLE LITE Use to check blood sugars every morning fasting   lisinopril 5 MG tablet Commonly known as: ZESTRIL Take 5 mg by mouth daily.   multivitamin with minerals Tabs tablet Take 1 tablet by mouth daily. Men's One-A-Day Multivitamin   Rybelsus 3 MG Tabs Generic drug: Semaglutide Take 1 tablet (3 mg total) by mouth daily.   Trospium Chloride 60 MG Cp24 Take 1 capsule (60 mg total) by mouth daily. Started by: Michiel Cowboy        Allergies:   Allergies  Allergen Reactions   Bee Venom Anaphylaxis   Penicillins Other (See Comments)    CAUSES "FREE BLEEDING"  Has patient had a PCN reaction causing immediate rash, facial/tongue/throat swelling, SOB or lightheadedness with hypotension: No  Has patient had a PCN reaction causing severe rash involving mucus membranes or skin necrosis: No  Has patient had a PCN reaction that required hospitalization No  Has patient had a PCN reaction occurring within the last 10 years: No  If all of the above answers are "NO", then may proceed with Cephalosporin use.  Other Reaction(s): Other (See Comments)  CAUSES "FREE BLEEDING", Has patient had a PCN reaction causing immediate rash, facial/tongue/throat swelling, SOB or lightheadedness with hypotension: No, Has patient had a PCN reaction causing severe rash involving mucus membranes or skin necrosis: No, Has patient had a PCN reaction that required hospitalization No, Has patient had a PCN reaction occurring within the last 10 years: No, If all of the above answers are "NO", then may proceed with Cephalosporin use.    Family History: Family History  Problem Relation Age of Onset   Cancer Father        pancreatic   Diabetes Father     Social History:  reports that he has quit smoking. His smoking use included cigarettes. He has a 25 pack-year smoking history. He has never been exposed to tobacco smoke. He has never used smokeless tobacco. He reports that he does not currently use alcohol after a past usage of about 2.0 standard drinks of alcohol per week. He reports that he does not use drugs.  ROS: Pertinent ROS in HPI  Physical Exam: BP 124/83   Pulse 60   Constitutional:  Well nourished. Alert and oriented, No acute distress. HEENT: [place] AT, moist mucus membranes.  Trachea midline Cardiovascular: No clubbing, cyanosis, or edema. Respiratory: Normal respiratory effort, no increased work of breathing. GU: No CVA tenderness.  No bladder  fullness or masses.  Patient with uncircumcised phallus. Foreskin easily retracted  Urethral meatus is patent.  No penile discharge. No penile lesions or rashes. Scrotum without lesions, cysts, rashes and/or edema.   Neurologic: Grossly intact, no focal deficits, moving all 4 extremities. Psychiatric: Normal mood and affect.  Laboratory Data: Lab Results  Component Value Date   WBC 9.0 07/13/2023   HGB 13.5 07/13/2023   HCT  42.1 07/13/2023   MCV 85 07/13/2023   PLT 207 07/13/2023    Lab Results  Component Value Date   CREATININE 0.90 07/13/2023    Lab Results  Component Value Date   HGBA1C 10.5 (H) 07/13/2023    Lab Results  Component Value Date   TSH 3.270 07/13/2023       Component Value Date/Time   CHOL 109 07/13/2023 0951   HDL 37 (L) 07/13/2023 0951   CHOLHDL 2.9 07/13/2023 0951   CHOLHDL 6.7 08/12/2016 0048   VLDL 66 (H) 08/12/2016 0048   LDLCALC 53 07/13/2023 0951    Lab Results  Component Value Date   AST 21 07/13/2023   Lab Results  Component Value Date   ALT 29 07/13/2023   Urinalysis See HPI and EPIC I have reviewed the labs.   Pertinent Imaging:  08/30/23 10:22  Scan Result 35ml  KUB interpretation by radiology still pending  I have independently reviewed the films.  See HPI  Assessment & Plan:    1. Dysuria -UA bland - CULTURE, URINE COMPREHENSIVE - Bladder Scan (Post Void Residual) in office -do not prescribe antibiotic at this visit - will treat if urine culture positive -if urine culture is negative, will see if the sanctura addresses this issue  -it is likely due to dehydration or glucosuria   2. Urinary frequency - Urinalysis, Complete - CULTURE, URINE COMPREHENSIVE - Bladder Scan (Post Void Residual) in office -Explained that with his elevated sugars and the medicines that he is on causing him to drop more glucose into his urine he will likely have more issues with urinary urgency and frequency -It looks like Myrbetriq is not  on formulary for his insurance, but Sanctura XL 60 mg daily is, so I have sent a prescription into his pharmacy for this medication to take once daily -I also explained that in order for his penis to gain length, he will have to lose his abdominal fat  3. Nephrolithiasis -KUB no stones seen   Return for pending  urine cutlure and radiologist interpretation of KUB .  These notes generated with voice recognition software. I apologize for typographical errors.  Cloretta Ned  [facility] [address]  [address] [place], [place] [address] [phone]

## 2023-08-30 NOTE — Telephone Encounter (Signed)
He wanted a phone call regarding his Xray results.   I have looked at the Xray and did not see any obvious stones, but the radiologist has not given their final report.  We will contact him when we receive those results.  It may take a few days.

## 2023-08-31 ENCOUNTER — Telehealth: Payer: Self-pay

## 2023-08-31 ENCOUNTER — Other Ambulatory Visit: Payer: Self-pay | Admitting: Family Medicine

## 2023-08-31 NOTE — Telephone Encounter (Signed)
I called patient to inform him that his Rybelsus 7 mg and 3mg  came in.   Pt states he will be here to pick up by 12

## 2023-09-03 LAB — CULTURE, URINE COMPREHENSIVE

## 2023-09-03 NOTE — Telephone Encounter (Signed)
Pt aware per vm.  

## 2023-09-03 NOTE — Group Note (Deleted)

## 2023-09-13 ENCOUNTER — Other Ambulatory Visit: Payer: Self-pay | Admitting: Family Medicine

## 2023-09-13 DIAGNOSIS — E1169 Type 2 diabetes mellitus with other specified complication: Secondary | ICD-10-CM

## 2023-09-15 ENCOUNTER — Other Ambulatory Visit: Payer: Self-pay | Admitting: Family Medicine

## 2023-09-15 ENCOUNTER — Other Ambulatory Visit: Payer: Self-pay | Admitting: Nurse Practitioner

## 2023-09-15 DIAGNOSIS — E1169 Type 2 diabetes mellitus with other specified complication: Secondary | ICD-10-CM

## 2023-09-17 MED ORDER — FREESTYLE LIBRE 14 DAY SENSOR MISC
3 refills | Status: DC
Start: 2023-09-17 — End: 2024-01-25

## 2023-09-20 ENCOUNTER — Other Ambulatory Visit: Payer: Self-pay | Admitting: Family Medicine

## 2023-09-20 DIAGNOSIS — E1169 Type 2 diabetes mellitus with other specified complication: Secondary | ICD-10-CM

## 2023-09-21 NOTE — Telephone Encounter (Signed)
As of appt in July 2024, patient informed provider that he has found that gabapentin is more helpful than pregabalin. Discontinued pregabalin. Continue gabapentin 1200 mg twice daily.

## 2023-09-29 ENCOUNTER — Ambulatory Visit
Admission: EM | Admit: 2023-09-29 | Discharge: 2023-09-29 | Disposition: A | Discharge status: Home / Self Care | Payer: PPO | Attending: Physician Assistant | Admitting: Physician Assistant

## 2023-09-29 ENCOUNTER — Other Ambulatory Visit: Payer: Self-pay

## 2023-09-29 ENCOUNTER — Encounter: Payer: Self-pay | Admitting: *Deleted

## 2023-09-29 DIAGNOSIS — Z1152 Encounter for screening for COVID-19: Secondary | ICD-10-CM | POA: Insufficient documentation

## 2023-09-29 DIAGNOSIS — J069 Acute upper respiratory infection, unspecified: Secondary | ICD-10-CM | POA: Diagnosis not present

## 2023-09-29 MED ORDER — PROMETHAZINE-DM 6.25-15 MG/5ML PO SYRP: 5.0000 mL | ORAL_SOLUTION | Freq: Four times a day (QID) | ORAL | 0 refills | Status: DC | PRN

## 2023-09-29 NOTE — ED Provider Notes (Signed)
[name]    CSN: [id number] Arrival date & time: 09/29/23  0941      History   Chief Complaint Chief Complaint  Patient presents with   Cough    HPI [name] is a 72 y.o. male.   Patient here today for evaluation of cough and congestion that started a few days ago.  He reports he has had bodyaches but has not had fever that he is aware of.  He denies any nausea, vomiting or diarrhea.  He denies sore throat or ear pain.  The history is provided by the patient.  Cough Associated symptoms: myalgias   Associated symptoms: no ear pain, no eye discharge, no fever, no shortness of breath and no sore throat     Past Medical History:  Diagnosis Date   Abdominal aortic aneurysm (AAA) (HCC)    3 cm per 07-02-18 US abdominal aorta US epic   Arthritis    Blood clot in vein    behind left knee   Chronic venous insufficiency LOWER EXTREMITIES   Coronary artery disease CARDIOLOGIST - DR  [id number]-  LAST 1 WK AGO -- WILL REQUEST NOTE AND STRESS TEST   Diabetes mellitus without complication (HCC)    type 2   Hematuria    last year   History of kidney stones    Hyperglycemia    Hyperlipidemia    Hypertension    Mixed dyslipidemia    Myocardial infarction (HCC)    Neuropathy    toes only   Nocturia    Paraseptal emphysema (HCC) 03/19/2018   Prostate cancer (HCC) 08/18/2011   gleason 8, volume 24.4cc   S/P CABG x 4    ST elevation MI (STEMI) (HCC) 02/10/2008   S/P CABG   Stroke (HCC) 2016   occ trouble wriing with right hand   Urinary hesitancy    Vision abnormalities    resolved now   Wound discharge    since jan 2019 goes to wound center [place] left great toe small open area changes dressing q 2 days with ointment provided by wound center, clear drainage occ    Patient Active Problem List   Diagnosis Date Noted   Dyslipidemia (high LDL; low HDL) 04/24/2023   Chronic combined systolic and diastolic CHF, NYHA class 2 (HCC) 04/24/2023    Hypertensive heart disease with heart failure (HCC) 03/07/2023   S/P total knee replacement 11/27/2022   Lung nodule 06/19/2022   Microalbuminuria due to type 2 diabetes mellitus (HCC) 04/19/2020   Diabetic polyneuropathy associated with type 2 diabetes mellitus (HCC) 04/19/2020   PAD (peripheral artery disease) (HCC) 01/28/2019   Great toe pain, left 10/21/2018   Paraseptal emphysema (HCC) 03/19/2018   Paroxysmal atrial fibrillation (HCC) 11/22/2016   History of embolic stroke 11/22/2016   Type 2 diabetes mellitus with obesity (HCC) 11/22/2016   Venous insufficiency of both lower extremities 09/21/2016   Hypertension associated with diabetes (HCC)    Cerebral infarction (HCC) 08/11/2016   CAD (coronary artery disease) 09/23/2013   Morbid obesity (HCC) 09/23/2013   Mixed diabetic hyperlipidemia associated with type 2 diabetes mellitus (HCC) 09/23/2013   Malignant neoplasm of prostate (HCC) 08/18/2011   Myocardial infarction [facility])     Past Surgical History:  Procedure Laterality Date   CARDIAC CATHETERIZATION  05/01/2012   grafts widely patent   CARDIOVASCULAR STRESS TEST  03-15-2010   INFERIOR WALL SCAR WITHOUT ANY MEANINGFUL ISCHEMIA/ EF 46% / LOW RISK SCAN   CATARACT EXTRACTION  CORONARY ARTERY BYPASS GRAFT  02-10-2008  DR Jacinto Halim   X4 VESSEL DISEASE / [facility] CABG   CYSTOSCOPY  02/08/2012   Procedure: CYSTOSCOPY FLEXIBLE;  Surgeon: Marcine Matar, MD;  Location: [facility];  Service: Urology;;   CYSTOSCOPY WITH RETROGRADE PYELOGRAM, URETEROSCOPY AND STENT PLACEMENT Right 09/12/2018   Procedure: CYSTOSCOPY WITH RETROGRADE PYELOGRAM, URETEROSCOPY AND STENT PLACEMENT, STONE EXTRACTION;  Surgeon: Marcine Matar, MD;  Location: [facility];  Service: Urology;  Laterality: Right;   EP IMPLANTABLE DEVICE N/A 08/14/2016   Procedure: Loop Recorder Insertion;  Surgeon: Marinus Maw, MD;  Location: [facility] INVASIVE CV LAB;  Service: Cardiovascular;  Laterality: N/A;    EXTRACORPOREAL SHOCK WAVE LITHOTRIPSY  2013   HOLMIUM LASER APPLICATION Right 09/12/2018   Procedure: HOLMIUM LASER APPLICATION;  Surgeon: Marcine Matar, MD;  Location: [facility];  Service: Urology;  Laterality: Right;   KNEE SURGERY  age 72   RIGHT   LEFT HEART CATHETERIZATION WITH CORONARY/GRAFT ANGIOGRAM N/A 05/01/2012   Procedure: LEFT HEART CATHETERIZATION WITH Isabel Caprice;  Surgeon: Thurmon Fair, MD;  Location: [facility] CATH LAB;  Service: Cardiovascular;  Laterality: N/A;   MULTIPLE TEETH EXTRACTIONS (23)/ FOUR QUADRANT ALVEOLPLASTY/ MANDIBULAR LATERAL EXOSTOSES REDUCTIONS  03-24-2010   CHRONIC PERIODONTITIS   RADIOACTIVE SEED IMPLANT  02/08/2012   Procedure: RADIOACTIVE SEED IMPLANT;  Surgeon: Marcine Matar, MD;  Location: [facility];  Service: Urology;  Laterality: N/A;  C-ARM    SHOULDER SURGERY  1982   LEFT   TEE WITHOUT CARDIOVERSION N/A 08/14/2016   Procedure: TRANSESOPHAGEAL ECHOCARDIOGRAM (TEE);  Surgeon: Wendall Stade, MD;  Location[facility];  Service: Cardiovascular;  Laterality: N/A;   TOTAL KNEE ARTHROPLASTY Right 11/27/2022   Procedure: TOTAL KNEE ARTHROPLASTY;  Surgeon: Dannielle Huh, MD;  Location: [facility];  Service: Orthopedics;  Laterality: Right;   URETERAL STENT PLACEMENT         Home Medications    Prior to Admission medications   Medication Sig Start Date End Date Taking? Authorizing Provider  amiodarone (PACERONE) 200 MG tablet Take 2 tablets twice daily for 1 week, then 2 tablets once daily for a week, then 200 mg 1 tablet once daily thereafter 04/04/23  Yes Croitoru, Mihai, MD  apixaban (ELIQUIS) 5 MG TABS tablet Take 1 tablet (5 mg total) by mouth 2 (two) times daily. 08/09/23  Yes Croitoru, Mihai, MD  atorvastatin (LIPITOR) 10 MG tablet Take 1 tablet by mouth once daily 09/17/23  Yes Edstrom, Simmie Davies, PA  Continuous Glucose Sensor (FREESTYLE LIBRE 14 DAY SENSOR) MISC See admin instructions 09/17/23  Yes Saralyn Pilar A, PA   furosemide (LASIX) 20 MG tablet Take 1 tablet (20 mg total) by mouth as directed. Take 20 mg on  Mon., Wens., Friday. Take 40 mg Tues.,Thurs.,Sat., On Sunday Take 20 mg. 06/19/23  Yes Jodelle Gross, NP  gabapentin (NEURONTIN) 600 MG tablet Take 2 tablets (1,200 mg total) by mouth 2 (two) times daily. 10/04/23  Yes Saralyn Pilar A, PA  glipiZIDE (GLUCOTROL) 10 MG tablet Take 1 tablet (10 mg total) by mouth 2 (two) times daily before a meal. 07/18/23  Yes Edstrom, Morgan A, PA  lisinopril (ZESTRIL) 5 MG tablet Take 5 mg by mouth daily. 06/08/23  Yes [provider]  promethazine-dextromethorphan (PROMETHAZINE-DM) 6.25-15 MG/5ML syrup Take 5 mLs by mouth 4 (four) times daily as needed for cough. 09/29/23  Yes Tomi Bamberger, PA-C  Semaglutide (RYBELSUS) 3 MG TABS Take 1 tablet (3 mg total) by mouth daily. 07/18/23  Yes Saralyn Pilar A, PA  Trospium Chloride 60 MG CP24 Take 1 capsule (60 mg total) by mouth daily. 08/30/23  Yes McGowan, Carollee Herter A, PA-C  blood glucose meter kit and supplies KIT Dispense based on patient and insurance preference. 10/20/20   Mayer Masker, PA-C  glucose blood (FREESTYLE LITE) test strip Use to check blood sugars every morning fasting 04/02/18   Danford, Orpha Bur D, NP  Multiple Vitamin (MULTIVITAMIN WITH MINERALS) TABS tablet Take 1 tablet by mouth daily. Men's One-A-Day Multivitamin    [provider]  Semaglutide (RYBELSUS) 7 MG TABS Take 7 mg by mouth daily.    Melida Quitter, PA    Family History Family History  Problem Relation Age of Onset   Cancer Father        pancreatic   Diabetes Father     Social History Social History   Tobacco Use   Smoking status: Former    Current packs/day: 0.50    Average packs/day: 0.5 packs/day for 50.0 years (25.0 ttl pk-yrs)    Types: Cigarettes    Passive exposure: Never   Smokeless tobacco: Never   Tobacco comments:    PREVIOUSLY SMOKED 3PPD / DECREASED TO 1PPD SINCE 2009  Vaping Use   Vaping  status: Never Used  Substance Use Topics   Alcohol use: Not Currently    Alcohol/week: 2.0 standard drinks of alcohol    Types: 1 Cans of beer, 1 Shots of liquor per week   Drug use: No     Allergies   Bee venom and Penicillins   Review of Systems Review of Systems  Constitutional:  Negative for fever.  HENT:  Positive for congestion. Negative for ear pain and sore throat.   Eyes:  Negative for discharge and redness.  Respiratory:  Positive for cough. Negative for shortness of breath.   Gastrointestinal:  Negative for abdominal pain, diarrhea, nausea and vomiting.  Musculoskeletal:  Positive for myalgias.     Physical Exam Triage Vital Signs ED Triage Vitals [09/29/23 1059]  Encounter Vitals Group     BP      Systolic BP Percentile      Diastolic BP Percentile      Pulse      Resp      Temp      Temp src      SpO2      Weight      Height      Head Circumference      Peak Flow      Pain Score 0     Pain Loc      Pain Education      Exclude from Growth Chart    No data found.  Updated Vital Signs BP 131/75 (BP Location: Left Arm)   Pulse 60   Temp 97.8 F (36.6 C) (Oral)   Resp 18   SpO2 95%   Physical Exam Vitals and nursing note reviewed.  Constitutional:      General: He is not in acute distress.    Appearance: Normal appearance. He is not ill-appearing.  HENT:     Head: Normocephalic and atraumatic.     Nose: Congestion present.     Mouth/Throat:     Mouth: Mucous membranes are moist.     Pharynx: Oropharynx is clear. No oropharyngeal exudate or posterior oropharyngeal erythema.  Eyes:     Conjunctiva/sclera: Conjunctivae normal.  Cardiovascular:     Rate and Rhythm: Normal rate and regular rhythm.  Heart sounds: Normal heart sounds. No murmur heard. Pulmonary:     Effort: Pulmonary effort is normal. No respiratory distress.     Breath sounds: Normal breath sounds. No wheezing, rhonchi or rales.  Skin:    General: Skin is warm and dry.   Neurological:     Mental Status: He is alert.  Psychiatric:        Mood and Affect: Mood normal.        Thought Content: Thought content normal.      UC Treatments / Results  Labs (all labs ordered are listed, but only abnormal results are displayed) Labs Reviewed  SARS CORONAVIRUS 2 (TAT 6-24 HRS)    EKG   Radiology No results found.  Procedures Procedures (including critical care time)  Medications Ordered in UC Medications - No data to display  Initial Impression / Assessment and Plan / UC Course  I have reviewed the triage vital signs and the nursing notes.  Pertinent labs & imaging results that were available during my care of the patient were reviewed by me and considered in my medical decision making (see chart for details).    Suspect likely viral etiology of symptoms and will screen for COVID.  Will await results for further recommendation but encouraged symptomatic treatment, increase fluids and rest.  Cough syrup prescribed.  Advise follow-up if symptoms fail to improve or worsen in any way.  Final Clinical Impressions(s) / UC Diagnoses   Final diagnoses:  Acute upper respiratory infection   Discharge Instructions   None    ED Prescriptions     Medication Sig Dispense Auth. Provider   promethazine-dextromethorphan (PROMETHAZINE-DM) 6.25-15 MG/5ML syrup Take 5 mLs by mouth 4 (four) times daily as needed for cough. 118 mL Tomi Bamberger, PA-C      PDMP not reviewed this encounter.   Tomi Bamberger, PA-C 09/29/23 1146

## 2023-09-29 NOTE — ED Triage Notes (Signed)
Productive cough x 3 days and runny nose. Denies fever. Taking OTC meds without relief

## 2023-09-30 LAB — SARS CORONAVIRUS 2 (TAT 6-24 HRS): SARS Coronavirus 2: NEGATIVE

## 2023-10-02 ENCOUNTER — Telehealth: Payer: Self-pay | Admitting: *Deleted

## 2023-10-02 NOTE — Telephone Encounter (Signed)
Spoke with rep from [employer] who states that patient is on auto refill and his next refill is currently being processed.   Patient has been notified. He states that he is currently out. I recommended that patient contact [employer] and request that refill be expedited. Patient agreed with advisement.

## 2023-10-02 NOTE — Telephone Encounter (Signed)
Pt calling stating that he gets his rybelsus through patient assistance program and he needs more medication.  He said he wasn't sure if he should contact PCP or the patient assistance program.

## 2023-10-03 NOTE — Telephone Encounter (Signed)
Rybelsus received in office today. Patient has been notified that medication is ready for pick up.

## 2023-10-11 ENCOUNTER — Other Ambulatory Visit: Payer: Self-pay

## 2023-10-11 ENCOUNTER — Other Ambulatory Visit: Payer: PPO

## 2023-10-11 DIAGNOSIS — E1165 Type 2 diabetes mellitus with hyperglycemia: Secondary | ICD-10-CM

## 2023-10-11 DIAGNOSIS — E1159 Type 2 diabetes mellitus with other circulatory complications: Secondary | ICD-10-CM

## 2023-10-17 ENCOUNTER — Other Ambulatory Visit: Payer: PPO

## 2023-10-17 DIAGNOSIS — E1159 Type 2 diabetes mellitus with other circulatory complications: Secondary | ICD-10-CM | POA: Diagnosis not present

## 2023-10-17 DIAGNOSIS — E1165 Type 2 diabetes mellitus with hyperglycemia: Secondary | ICD-10-CM

## 2023-10-17 DIAGNOSIS — I152 Hypertension secondary to endocrine disorders: Secondary | ICD-10-CM | POA: Diagnosis not present

## 2023-10-18 LAB — CBC WITH DIFFERENTIAL/PLATELET
Basophils Absolute: 0.1 10*3/uL (ref 0.0–0.2)
Basos: 1 %
EOS (ABSOLUTE): 0.3 10*3/uL (ref 0.0–0.4)
Eos: 3 %
Hematocrit: 46.4 % (ref 37.5–51.0)
Hemoglobin: 14.3 g/dL (ref 13.0–17.7)
Immature Grans (Abs): 0 10*3/uL (ref 0.0–0.1)
Immature Granulocytes: 0 %
Lymphocytes Absolute: 3.1 10*3/uL (ref 0.7–3.1)
Lymphs: 35 %
MCH: 27.8 pg (ref 26.6–33.0)
MCHC: 30.8 g/dL — ABNORMAL LOW (ref 31.5–35.7)
MCV: 90 fL (ref 79–97)
Monocytes Absolute: 0.6 10*3/uL (ref 0.1–0.9)
Monocytes: 7 %
Neutrophils Absolute: 4.8 10*3/uL (ref 1.4–7.0)
Neutrophils: 54 %
Platelets: 224 10*3/uL (ref 150–450)
RBC: 5.14 x10E6/uL (ref 4.14–5.80)
RDW: 13.4 % (ref 11.6–15.4)
WBC: 8.9 10*3/uL (ref 3.4–10.8)

## 2023-10-18 LAB — COMPREHENSIVE METABOLIC PANEL
ALT: 22 [IU]/L (ref 0–44)
AST: 19 [IU]/L (ref 0–40)
Albumin: 4.4 g/dL (ref 3.8–4.8)
Alkaline Phosphatase: 127 [IU]/L — ABNORMAL HIGH (ref 44–121)
BUN/Creatinine Ratio: 14 (ref 10–24)
BUN: 14 mg/dL (ref 8–27)
Bilirubin Total: 0.5 mg/dL (ref 0.0–1.2)
CO2: 27 mmol/L (ref 20–29)
Calcium: 10 mg/dL (ref 8.6–10.2)
Chloride: 102 mmol/L (ref 96–106)
Creatinine, Ser: 1.01 mg/dL (ref 0.76–1.27)
Globulin, Total: 3.3 g/dL (ref 1.5–4.5)
Glucose: 233 mg/dL — ABNORMAL HIGH (ref 70–99)
Potassium: 4.7 mmol/L (ref 3.5–5.2)
Sodium: 141 mmol/L (ref 134–144)
Total Protein: 7.7 g/dL (ref 6.0–8.5)
eGFR: 80 mL/min/{1.73_m2} (ref >59)

## 2023-10-18 LAB — HEMOGLOBIN A1C
Est. average glucose Bld gHb Est-mCnc: 243 mg/dL
Hgb A1c MFr Bld: 10.1 % — ABNORMAL HIGH (ref 4.8–5.6)

## 2023-10-18 LAB — LIPID PANEL
Chol/HDL Ratio: 3.3 ratio (ref 0.0–5.0)
Cholesterol, Total: 101 mg/dL (ref 100–199)
HDL: 31 mg/dL — ABNORMAL LOW (ref >39)
LDL Chol Calc (NIH): 45 mg/dL (ref 0–99)
Triglycerides: 142 mg/dL (ref 0–149)
VLDL Cholesterol Cal: 25 mg/dL (ref 5–40)

## 2023-10-24 ENCOUNTER — Ambulatory Visit (INDEPENDENT_AMBULATORY_CARE_PROVIDER_SITE_OTHER): Payer: PPO | Admitting: Family Medicine

## 2023-10-24 ENCOUNTER — Other Ambulatory Visit: Payer: Self-pay

## 2023-10-24 ENCOUNTER — Encounter: Payer: Self-pay | Admitting: Family Medicine

## 2023-10-24 VITALS — BP 123/77 | HR 75 | Resp 18 | Ht 71.0 in | Wt 245.0 lb

## 2023-10-24 DIAGNOSIS — E1159 Type 2 diabetes mellitus with other circulatory complications: Secondary | ICD-10-CM | POA: Diagnosis not present

## 2023-10-24 DIAGNOSIS — E782 Mixed hyperlipidemia: Secondary | ICD-10-CM | POA: Diagnosis not present

## 2023-10-24 DIAGNOSIS — E1169 Type 2 diabetes mellitus with other specified complication: Secondary | ICD-10-CM | POA: Diagnosis not present

## 2023-10-24 DIAGNOSIS — E669 Obesity, unspecified: Secondary | ICD-10-CM

## 2023-10-24 DIAGNOSIS — E1142 Type 2 diabetes mellitus with diabetic polyneuropathy: Secondary | ICD-10-CM | POA: Diagnosis not present

## 2023-10-24 DIAGNOSIS — I152 Hypertension secondary to endocrine disorders: Secondary | ICD-10-CM

## 2023-10-24 MED ORDER — GLIPIZIDE 10 MG PO TABS: 10.0000 mg | ORAL_TABLET | Freq: Two times a day (BID) | ORAL | 3 refills | Status: DC

## 2023-10-24 MED ORDER — ATORVASTATIN CALCIUM 10 MG PO TABS
10.0000 mg | ORAL_TABLET | Freq: Every day | ORAL | 0 refills | Status: DC
Start: 2023-10-24 — End: 2024-01-25

## 2023-10-24 MED ORDER — PREGABALIN 50 MG PO CAPS: 50.0000 mg | ORAL_CAPSULE | Freq: Two times a day (BID) | ORAL | 1 refills | Status: DC

## 2023-10-24 NOTE — Assessment & Plan Note (Signed)
BP goal <130/80.  Blood pressure stable, at goal.  Continue follow-up with cardiology.  Continue ambulatory blood pressure monitoring.  Continue Lasix 20 mg Sun/M/W/F and 40 mg T/Th/Sat, lisinopril 5 mg daily, amiodarone 5 mg daily.  Will continue to monitor.  CMP within normal limits other than slightly elevated alkaline phosphatase at 127, will continue to monitor.

## 2023-10-24 NOTE — Patient Instructions (Signed)
Continue Rybelsus 7 mg through the assistance program. INCREASE glipizide to 10 mg twice daily before meals. You can finish your current bottle by taking 2 tablets twice a day, then I will send in the higher dose to the pharmacy.  STOP taking gabapentin. START Lyrica

## 2023-10-24 NOTE — Assessment & Plan Note (Signed)
Discontinue gabapentin, restart pregabalin 50 mg twice daily.

## 2023-10-24 NOTE — Assessment & Plan Note (Signed)
A1c improved slightly to 10.1.  Continue Rybelsus 7 mg daily through [employer] patient assistance program.  Clarified that he should be taking glipizide 10 mg twice daily with meals and recent prescription.  Discussed fasting blood glucose goals of 80-140 and encourage patient to also check his blood pressure when he feels unwell after working outside as it may not be blood sugar levels causing his symptoms.  Patient verbalized understanding and is agreeable with this plan.  Discussed the dangers of taking gabapentin at the same time as pregabalin, patient would like to restart pregabalin and optimize therapy.  Start pregabalin 50 mg twice daily, can titrate up to 100 mg twice daily from now until our next appointment.  Maximum dose 600 mg daily in 2-3 divided doses.  Will continue to monitor.

## 2023-10-24 NOTE — Assessment & Plan Note (Signed)
Last lipid panel: LDL 45, HDL 31, triglycerides 142.  CMP within normal limits with the exception of slightly elevated alkaline phosphatase.  Continue atorvastatin 10 mg daily. Will continue to monitor, encouraged heart healthy diet low in saturated and trans fats.

## 2023-10-24 NOTE — Progress Notes (Signed)
Established Patient Office Visit  Subjective   Patient ID: [name], male    DOB: 1951-11-25  Age: 72 y.o. MRN: [id number]  Chief Complaint  Patient presents with   Diabetes   Hyperlipidemia   Hypertension    HPI [name] is a 72 y.o. male presenting today for follow up of hypertension, hyperlipidemia, diabetes. Hypertension: Patient here for follow-up of elevated blood pressure. He is exercising but does stay very active during the day.  Pt denies chest pain, SOB, dizziness, edema, syncope, fatigue or heart palpitations. Taking lisinopril, amiodarone, and daily furosemide at the instruction of cardiology, reports excellent compliance with treatment. Denies side effects. Hyperlipidemia: tolerating atorvastatin well with no myalgias or significant side effects.  The ASCVD Risk score (Arnett DK, et al., 2019) failed to calculate for the following reasons:   The patient has a prior MI or stroke diagnosis Diabetes: denies hypoglycemic events, wounds or sores that are not healing well, increased thirst or urination. Denies vision problems, eye exam completed 08/21/2023. Checking glucose at home, ranges have been mostly in the 200s but as high as 400.  He states that he will sometimes feel bad when he is outside working and checks his blood sugar, at those times it is "low for him" at around 227. Taking Rybelsus as prescribed without any side effects through the patient assistance program.  He is also taking glipizide twice daily with meals, but is only taking 5 mg tablets.  He complains of worsening neuropathy, the gabapentin is not controlling neuropathic pain.  Outpatient Medications Prior to Visit  Medication Sig   amiodarone (PACERONE) 200 MG tablet Take 2 tablets twice daily for 1 week, then 2 tablets once daily for a week, then 200 mg 1 tablet once daily thereafter   apixaban (ELIQUIS) 5 MG TABS tablet Take 1 tablet (5 mg total) by mouth 2 (two) times daily.   blood glucose  meter kit and supplies KIT Dispense based on patient and insurance preference.   Continuous Glucose Sensor (FREESTYLE LIBRE 14 DAY SENSOR) MISC See admin instructions   furosemide (LASIX) 20 MG tablet Take 1 tablet (20 mg total) by mouth as directed. Take 20 mg on  Mon., Wens., Friday. Take 40 mg Tues.,Thurs.,Sat., On Sunday Take 20 mg.   glucose blood (FREESTYLE LITE) test strip Use to check blood sugars every morning fasting   lisinopril (ZESTRIL) 5 MG tablet Take 5 mg by mouth daily.   Multiple Vitamin (MULTIVITAMIN WITH MINERALS) TABS tablet Take 1 tablet by mouth daily. Men's One-A-Day Multivitamin   promethazine-dextromethorphan (PROMETHAZINE-DM) 6.25-15 MG/5ML syrup Take 5 mLs by mouth 4 (four) times daily as needed for cough.   Semaglutide (RYBELSUS) 7 MG TABS Take 7 mg by mouth daily.   Trospium Chloride 60 MG CP24 Take 1 capsule (60 mg total) by mouth daily.   [DISCONTINUED] atorvastatin (LIPITOR) 10 MG tablet Take 1 tablet by mouth once daily   [DISCONTINUED] gabapentin (NEURONTIN) 600 MG tablet Take 2 tablets (1,200 mg total) by mouth 2 (two) times daily.   [DISCONTINUED] glipiZIDE (GLUCOTROL) 10 MG tablet Take 1 tablet (10 mg total) by mouth 2 (two) times daily before a meal.   [DISCONTINUED] PREVNAR 20 0.5 ML injection  (Patient not taking: Reported on 10/24/2023)   [DISCONTINUED] Semaglutide (RYBELSUS) 3 MG TABS Take 1 tablet (3 mg total) by mouth daily. (Patient not taking: Reported on 10/24/2023)   [DISCONTINUED] SHINGRIX injection  (Patient not taking: Reported on 10/24/2023)   No facility-administered medications  prior to visit.    ROS Negative unless otherwise noted in HPI   Objective:     BP 123/77 (BP Location: Left Arm, Patient Position: Sitting, Cuff Size: Large)   Pulse 75   Resp 18   Ht 5\' 11"  (1.803 m)   Wt 245 lb (111.1 kg)   SpO2 95%   BMI 34.17 kg/m   Physical Exam Constitutional:      General: He is not in acute distress.    Appearance: Normal  appearance.  HENT:     Head: Normocephalic and atraumatic.  Cardiovascular:     Rate and Rhythm: Normal rate and regular rhythm.     Heart sounds: Normal heart sounds. No murmur heard.    No friction rub. No gallop.  Pulmonary:     Effort: Pulmonary effort is normal. No respiratory distress.     Breath sounds: Normal breath sounds. No wheezing, rhonchi or rales.  Skin:    General: Skin is warm and dry.  Neurological:     Mental Status: He is alert and oriented to person, place, and time.  Psychiatric:        Mood and Affect: Mood normal.     Assessment & Plan:  Hypertension associated with diabetes (HCC) Assessment & Plan: BP goal <130/80.  Blood pressure stable, at goal.  Continue follow-up with cardiology.  Continue ambulatory blood pressure monitoring.  Continue Lasix 20 mg Sun/M/W/F and 40 mg T/Th/Sat, lisinopril 5 mg daily, amiodarone 5 mg daily.  Will continue to monitor.  CMP within normal limits other than slightly elevated alkaline phosphatase at 127, will continue to monitor.   Mixed diabetic hyperlipidemia associated with type 2 diabetes mellitus (HCC) Assessment & Plan: Last lipid panel: LDL 45, HDL 31, triglycerides 142.  CMP within normal limits with the exception of slightly elevated alkaline phosphatase.  Continue atorvastatin 10 mg daily. Will continue to monitor, encouraged heart healthy diet low in saturated and trans fats.  Orders: -     Atorvastatin Calcium; Take 1 tablet (10 mg total) by mouth daily.  Dispense: 90 tablet; Refill: 0  Type 2 diabetes mellitus with obesity (HCC) Assessment & Plan: A1c improved slightly to 10.1.  Continue Rybelsus 7 mg daily through [employer] patient assistance program.  Clarified that he should be taking glipizide 10 mg twice daily with meals and recent prescription.  Discussed fasting blood glucose goals of 80-140 and encourage patient to also check his blood pressure when he feels unwell after working outside as it may not be  blood sugar levels causing his symptoms.  Patient verbalized understanding and is agreeable with this plan.  Discussed the dangers of taking gabapentin at the same time as pregabalin, patient would like to restart pregabalin and optimize therapy.  Start pregabalin 50 mg twice daily, can titrate up to 100 mg twice daily from now until our next appointment.  Maximum dose 600 mg daily in 2-3 divided doses.  Will continue to monitor.  Orders: -     glipiZIDE; Take 1 tablet (10 mg total) by mouth 2 (two) times daily before a meal.  Dispense: 60 tablet; Refill: 3  Diabetic polyneuropathy associated with type 2 diabetes mellitus (HCC) Assessment & Plan: Discontinue gabapentin, restart pregabalin 50 mg twice daily.  Orders: -     Pregabalin; Take 1 capsule (50 mg total) by mouth 2 (two) times daily. If ineffective, may increase to 2 tablets 2 times daily.  Dispense: 120 capsule; Refill: 1    Return in about  3 months (around 01/24/2024) for follow-up for DM, neuropathy, POC A1C at visit.    Melida Quitter, PA

## 2023-11-07 ENCOUNTER — Telehealth: Payer: Self-pay

## 2023-11-07 NOTE — Telephone Encounter (Signed)
Increase fiber intake and ensure that he is maintaining adequate hydration with water and/or electrolyte drinks like Gatorade or Pedialyte.  If fiber alone either through food or a Metamucil supplement does not provide adequate relief, he can try Imodium or Pepto-Bismol

## 2023-11-07 NOTE — Telephone Encounter (Signed)
Pt LVM asking to recommendation to help him with Diarrhea.

## 2023-11-07 NOTE — Telephone Encounter (Signed)
Patient given provider's recommendations. Patient verbalized understanding. All questions and concerns have been addressed.

## 2023-11-29 DIAGNOSIS — Z96651 Presence of right artificial knee joint: Secondary | ICD-10-CM | POA: Diagnosis not present

## 2023-12-10 ENCOUNTER — Ambulatory Visit: Payer: PPO | Attending: Cardiovascular Disease | Admitting: Cardiovascular Disease

## 2023-12-10 ENCOUNTER — Encounter: Payer: Self-pay | Admitting: Cardiovascular Disease

## 2023-12-10 ENCOUNTER — Ambulatory Visit: Payer: Self-pay | Admitting: Family Medicine

## 2023-12-10 VITALS — BP 131/64 | HR 76 | Ht 71.0 in | Wt 240.0 lb

## 2023-12-10 DIAGNOSIS — Z720 Tobacco use: Secondary | ICD-10-CM | POA: Diagnosis not present

## 2023-12-10 DIAGNOSIS — E7800 Pure hypercholesterolemia, unspecified: Secondary | ICD-10-CM

## 2023-12-10 DIAGNOSIS — I2510 Atherosclerotic heart disease of native coronary artery without angina pectoris: Secondary | ICD-10-CM

## 2023-12-10 DIAGNOSIS — E1142 Type 2 diabetes mellitus with diabetic polyneuropathy: Secondary | ICD-10-CM

## 2023-12-10 DIAGNOSIS — I10 Essential (primary) hypertension: Secondary | ICD-10-CM

## 2023-12-10 DIAGNOSIS — D6869 Other thrombophilia: Secondary | ICD-10-CM

## 2023-12-10 DIAGNOSIS — I872 Venous insufficiency (chronic) (peripheral): Secondary | ICD-10-CM

## 2023-12-10 DIAGNOSIS — Z79899 Other long term (current) drug therapy: Secondary | ICD-10-CM

## 2023-12-10 DIAGNOSIS — I480 Paroxysmal atrial fibrillation: Secondary | ICD-10-CM

## 2023-12-10 DIAGNOSIS — I5042 Chronic combined systolic (congestive) and diastolic (congestive) heart failure: Secondary | ICD-10-CM

## 2023-12-10 DIAGNOSIS — I739 Peripheral vascular disease, unspecified: Secondary | ICD-10-CM | POA: Diagnosis not present

## 2023-12-10 NOTE — Telephone Encounter (Signed)
I advised patient to schedule a nurse visit to have his blood sugar level checked on office glucometer as well as home meter. Patient has agreed with advisement and scheduled appointment.

## 2023-12-10 NOTE — Patient Instructions (Signed)
Medication Instructions:  No changes *If you need a refill on your cardiac medications before your next appointment, please call your pharmacy*  Follow-Up: At [facility], you and your health needs are our priority.  As part of our continuing mission to provide you with exceptional heart care, we have created designated Provider Care Teams.  These Care Teams include your primary Cardiologist (physician) and Advanced Practice Providers (APPs -  Physician Assistants and Nurse Practitioners) who all work together to provide you with the care you need, when you need it.  We recommend signing up for the patient portal called "MyChart".  Sign up information is provided on this After Visit Summary.  MyChart is used to connect with patients for Virtual Visits (Telemedicine).  Patients are able to view lab/test results, encounter notes, upcoming appointments, etc.  Non-urgent messages can be sent to your provider as well.   To learn more about what you can do with MyChart, go to [email or web address].    Your next appointment:   6 mos APP  1 yr Dr Royann Shivers

## 2023-12-10 NOTE — Telephone Encounter (Signed)
  Chief Complaint: broken glucometer  Symptoms: elevated BG readings  Frequency: x 3 days  Pertinent Negatives: Patient denies headaches, recent illness, increased thirst or urination  Disposition: [x] ED /[] Urgent Care (no appt availability in office) / [] Appointment(In office/virtual)/ []  [facility]/ [] Home Care/ [x] Refused Recommended Disposition /[] [facility]/ []  Follow-up with PCP Additional Notes: Patient called triage line back stating he has not heard back from the clinic. Pt concerned his glucometer is broken due to elevated BG readings. Pt instructed to go to ED for BG 450 and "HI". Pt refusing and states he would like to come to the office and have someone check his BG. Called CAL line and no answer. Instructed patient to go to local drugstore and get a blood glucose test kit to confirm his BG. Instructed pt to call back with BG results and go to ED if BG elevated. Pt agreeable.   Copied from CRM [id number]. Topic: Clinical - Red Word Triage >> Dec 10, 2023 12:04 PM Prudencio Pair wrote: Red Word that prompted transfer to Nurse Triage: Patient called stating he needs his blood sugar checked. Reason for Disposition  [1] Caller requests to speak ONLY to PCP AND [2] URGENT question  Answer Assessment - Initial Assessment Questions 1. REASON FOR CALL or QUESTION: "What is your reason for calling today?" or "How can I best help you?" or "What question do you have that I can help answer?"     Patient states he does not want to be triaged or go to the ED. Patient states he thinks his glucometer is broken, "the meter is reading in a straight line indicates to me it's broken." Pt reports blood glucometer has been reading 450 or "HI" since Friday. Pt denies increased thirst or urination, headaches, fever or recent illness.  2. CALLER: Document the source of call. (e.g., laboratory, patient).     Patient.  Protocols used: PCP Call - No Triage-A-AH

## 2023-12-10 NOTE — Progress Notes (Signed)
Cardiology Office Note   Date:  12/15/2023   ID:  [name], DOB Aug 01, 1951, MRN [id number]  PCP:  Melida Quitter, PA  Cardiologist:  Thurmon Fair, MD  Electrophysiologist:  None   Evaluation Performed:  Follow-Up Visit  Chief Complaint:  CAD, AFib  History of Present Illness:    [name] is a 72 y.o. male with heart failure with mildly depressed left ventricular systolic function, coronary artery disease, CABG 4 in 2009, prior inferior wall infarction, hypertension, hyperlipidemia, type 2 diabetes mellitus, venous insufficiency of the lower extremities, previous right frontal cortical infarct, PAD of lower extremities, asymptomatic atrial fibrillation detected by implantable loop recorder.  In early 2024, for the first time he developed overt findings of congestive heart failure requiring diuretic therapy, which appeared to be due to a marked increase in the burden of atrial fibrillation (42% on 2-week monitor with rapid ventricular response), and was not due to a change in left ventricular systolic function (EF unchanged at 40-45%).  As always, he was arrhythmia unaware and did not recognize that he had atrial fibrillation.  He was started on amiodarone and this has led to a substantial improvement in his symptoms.  He currently has NYHA functional class I and the denies exertional dyspnea, orthopnea, PND, lower extremity edema and does not have angina pectoris even with physical activity.  He has not had dizziness, palpitations or syncope.  In the past he has occasionally had symptoms of lower extremity claudication, but these are not currently active.  Metabolic control of however has deteriorated with a hemoglobin A1c that is up to 10.1%.  His LDL remains excellent at 45, but his HDL is down to 31 and his triglycerides are borderline at 142.  He has normal renal function.  He is not smoking.  He is on atorvastatin and Rybelsus but also glipizide.  He could not tolerate  metformin due to diarrhea.  He has a remote history of stroke.  He has significant arterial disease in the left leg, ABI 0.77, occlusion of SFA, brief reconstitution and occlusion of mid popliteal and tibioperoneal trunk and peroneal, posterior tibial mid-calf reconstitution (duplex US 02/2019, unchanged in 02/2020).  His echocardiogram reported mild dilation of the ascending aorta at 42 mm on the study from April 2024, but aortic dilation was not mentioned on a chest CT without contrast in August 2023.  By my measurements the maximum mid ascending aorta diameter is 42 mm.  Aortic atherosclerosis and three-vessel coronary calcification was detected.  He had a vascular screen performed in August 2019 that did not show any evidence of focal stenoses in the aorta or iliac arteries, mild abdominal aortic ectasia with a maximum diameter of 2.4 cm.  He did not have any meaningful carotid stenoses by ultrasound in 2017.    Past Medical History:  Diagnosis Date   Abdominal aortic aneurysm (AAA) (HCC)    3 cm per 07-02-18 US abdominal aorta US epic   Arthritis    Blood clot in vein    behind left knee   Cerebral infarction (HCC) 08/11/2016   Chronic venous insufficiency LOWER EXTREMITIES   Coronary artery disease CARDIOLOGIST - DR  [id number]-  LAST 1 WK AGO -- WILL REQUEST NOTE AND STRESS TEST   Diabetes mellitus without complication (HCC)    type 2   Hematuria    last year   History of kidney stones    Hyperglycemia    Hyperlipidemia    Hypertension  Malignant neoplasm of prostate (HCC) 08/18/2011   gleason 8 Formatting of this note might be different from the original. Formatting of this note might be different from the original. gleason 8     Mixed dyslipidemia    Myocardial infarction (HCC)    Neuropathy    toes only   Nocturia    Paraseptal emphysema (HCC) 03/19/2018   Prostate cancer (HCC) 08/18/2011   gleason 8, volume 24.4cc   S/P CABG x 4    ST elevation MI (STEMI) (HCC)  02/10/2008   S/P CABG   Stroke (HCC) 2016   occ trouble wriing with right hand   Urinary hesitancy    Vision abnormalities    resolved now   Wound discharge    since jan 2019 goes to wound center [place] left great toe small open area changes dressing q 2 days with ointment provided by wound center, clear drainage occ   Past Surgical History:  Procedure Laterality Date   CARDIAC CATHETERIZATION  05/01/2012   grafts widely patent   CARDIOVASCULAR STRESS TEST  03-15-2010   INFERIOR WALL SCAR WITHOUT ANY MEANINGFUL ISCHEMIA/ EF 46% / LOW RISK SCAN   CATARACT EXTRACTION     CORONARY ARTERY BYPASS GRAFT  02-10-2008  DR Jacinto Halim   X4 VESSEL DISEASE / EMERGANT CABG   CYSTOSCOPY  02/08/2012   Procedure: CYSTOSCOPY FLEXIBLE;  Surgeon: Marcine Matar, MD;  Location: [facility];  Service: Urology;;   CYSTOSCOPY WITH RETROGRADE PYELOGRAM, URETEROSCOPY AND STENT PLACEMENT Right 09/12/2018   Procedure: CYSTOSCOPY WITH RETROGRADE PYELOGRAM, URETEROSCOPY AND STENT PLACEMENT, STONE EXTRACTION;  Surgeon: Marcine Matar, MD;  Location: [facility];  Service: Urology;  Laterality: Right;   EP IMPLANTABLE DEVICE N/A 08/14/2016   Procedure: Loop Recorder Insertion;  Surgeon: Marinus Maw, MD;  Location: [facility];  Service: Cardiovascular;  Laterality: N/A;   EXTRACORPOREAL SHOCK WAVE LITHOTRIPSY  2013   HOLMIUM LASER APPLICATION Right 09/12/2018   Procedure: HOLMIUM LASER APPLICATION;  Surgeon: Marcine Matar, MD;  Location: [facility];  Service: Urology;  Laterality: Right;   KNEE SURGERY  age 75   RIGHT   LEFT HEART CATHETERIZATION WITH CORONARY/GRAFT ANGIOGRAM N/A 05/01/2012   Procedure: LEFT HEART CATHETERIZATION WITH Isabel Caprice;  Surgeon: Thurmon Fair, MD;  Location: [facility];  Service: Cardiovascular;  Laterality: N/A;   MULTIPLE TEETH EXTRACTIONS (23)/ FOUR QUADRANT ALVEOLPLASTY/ MANDIBULAR LATERAL EXOSTOSES REDUCTIONS  03-24-2010   CHRONIC PERIODONTITIS    RADIOACTIVE SEED IMPLANT  02/08/2012   Procedure: RADIOACTIVE SEED IMPLANT;  Surgeon: Marcine Matar, MD;  Location: [facility];  Service: Urology;  Laterality: N/A;  C-ARM    SHOULDER SURGERY  1982   LEFT   TEE WITHOUT CARDIOVERSION N/A 08/14/2016   Procedure: TRANSESOPHAGEAL ECHOCARDIOGRAM (TEE);  Surgeon: Wendall Stade, MD;  Location: [facility];  Service: Cardiovascular;  Laterality: N/A;   TOTAL KNEE ARTHROPLASTY Right 11/27/2022   Procedure: TOTAL KNEE ARTHROPLASTY;  Surgeon: Dannielle Huh, MD;  Location: [facility];  Service: Orthopedics;  Laterality: Right;   URETERAL STENT PLACEMENT       Current Meds  Medication Sig   amiodarone (PACERONE) 200 MG tablet Take 2 tablets twice daily for 1 week, then 2 tablets once daily for a week, then 200 mg 1 tablet once daily thereafter   apixaban (ELIQUIS) 5 MG TABS tablet Take 1 tablet (5 mg total) by mouth 2 (two) times daily.   atorvastatin (LIPITOR) 10 MG tablet Take 1 tablet (10 mg total) by  mouth daily.   blood glucose meter kit and supplies KIT Dispense based on patient and insurance preference.   Continuous Glucose Sensor (FREESTYLE LIBRE 14 DAY SENSOR) MISC See admin instructions   furosemide (LASIX) 20 MG tablet Take 1 tablet (20 mg total) by mouth as directed. Take 20 mg on  Mon., Wens., Friday. Take 40 mg Tues.,Thurs.,Sat., On Sunday Take 20 mg.   glipiZIDE (GLUCOTROL) 10 MG tablet Take 1 tablet (10 mg total) by mouth 2 (two) times daily before a meal.   glucose blood (FREESTYLE LITE) test strip Use to check blood sugars every morning fasting   lisinopril (ZESTRIL) 5 MG tablet Take 5 mg by mouth daily.   Multiple Vitamin (MULTIVITAMIN WITH MINERALS) TABS tablet Take 1 tablet by mouth daily. Men's One-A-Day Multivitamin   pregabalin (LYRICA) 50 MG capsule Take 1 capsule (50 mg total) by mouth 2 (two) times daily. If ineffective, may increase to 2 tablets 2 times daily.   promethazine-dextromethorphan (PROMETHAZINE-DM)  6.25-15 MG/5ML syrup Take 5 mLs by mouth 4 (four) times daily as needed for cough.   Semaglutide (RYBELSUS) 7 MG TABS Take 7 mg by mouth daily.   Trospium Chloride 60 MG CP24 Take 1 capsule (60 mg total) by mouth daily.     Allergies:   Bee venom and Penicillins   Social History   Tobacco Use   Smoking status: Former    Current packs/day: 0.50    Average packs/day: 0.5 packs/day for 50.0 years (25.0 ttl pk-yrs)    Types: Cigarettes    Passive exposure: Never   Smokeless tobacco: Never   Tobacco comments:    PREVIOUSLY SMOKED 3PPD / DECREASED TO 1PPD SINCE 2009  Vaping Use   Vaping status: Never Used  Substance Use Topics   Alcohol use: Not Currently    Alcohol/week: 2.0 standard drinks of alcohol    Types: 1 Cans of beer, 1 Shots of liquor per week   Drug use: No     Family Hx: The patient's family history includes Cancer in his father; Diabetes in his father.  ROS:   Please see the history of present illness.     All other systems reviewed and are negative.   Prior CV studies:   The following studies were reviewed today:  Echocardiogram 04/04/2023     1. Left ventricular ejection fraction, by estimation, is 40 to 45%. The  left ventricle has mildly decreased function. The left ventricle  demonstrates global hypokinesis. Left ventricular diastolic parameters are  indeterminate.   2. Right ventricular systolic function is mildly reduced. The right  ventricular size is mildly enlarged. Tricuspid regurgitation signal is  inadequate for assessing PA pressure.   3. The mitral valve is normal in structure. Trivial mitral valve  regurgitation. No evidence of mitral stenosis.   4. The aortic valve was not well visualized. Aortic valve regurgitation  is trivial.   5. Aortic dilatation noted. There is mild dilatation of the ascending  aorta, measuring 42 mm.   Comparison(s): Prior images reviewed side by side. Aorta is now dilated. Similar function.    7-day  arrhythmia monitor 03/19/2023    The dominant rhythm is normal sinus rhythm with normal circadian variation.   There is a high prevalence of paroxysmal atrial fibrillation, representing 42% of the entire week recording.  The longer episode was 2 days and 18 hours and was present at the time of monitoring initiation.  Ventricular rate control during atrial fibrillation is poor with average rates of 110-140  bpm   There are rare PVCs and there is a single 12 beat run of nonsustained ventricular tachycardia at 144 bpm.   There is no evidence of significant bradycardia or pauses.   Abnormal arrhythmia monitor due to the presence of paroxysmal atrial fibrillation with poor ventricular rate control. mildly   ABI 03/19/2023   ABI/TBIToday's ABIToday's TBIPrevious ABIPrevious TBI  +-------+-----------+-----------+------------+------------+  Right 1.17       0.93       1.11        0.90          +-------+-----------+-----------+------------+------------+  Left  0.74       0.29       0.72        0.33          +-------+-----------+-----------+------------+------------+     Bilateral ABIs and TBIs appear essentially unchanged compared to prior study on 03/28/21 AND 03/28/22.     Summary:  Right: Resting right ankle-brachial index is within normal range. No evidence of significant right lower extremity arterial disease. The right toe-brachial index is normal.   Left: Resting left ankle-brachial index indicates moderate left lower extremity arterial disease. The left toe-brachial index is abnormal.   Labs/Other Tests and Data Reviewed:    EKG:   EKG Interpretation Date/Time:  Monday December 10 2023 08:36:59 EST Ventricular Rate:  76 PR Interval:  200 QRS Duration:  142 QT Interval:  438 QTC Calculation: 492 R Axis:   90  Text Interpretation: Normal sinus rhythm Right bundle branch block Possible Inferior infarct (cited on or before 15-Aug-2016) Anterolateral infarct (cited on or  before 15-Aug-2016) When compared with ECG of 15-Aug-2016 13:07, Right bundle branch block is now Present Questionable change in initial forces of Lateral leads Confirmed by Yeva Bissette [phone]) on 12/10/2023 8:43:46 AM           Recent Labs: 03/12/2023: BNP 289.6 07/13/2023: TSH 3.270 10/17/2023: ALT 22; BUN 14; Creatinine, Ser 1.01; Hemoglobin 14.3; Platelets 224; Potassium 4.7; Sodium 141   Recent Lipid Panel Lab Results  Component Value Date/Time   CHOL 101 10/17/2023 09:51 AM   TRIG 142 10/17/2023 09:51 AM   HDL 31 (L) 10/17/2023 09:51 AM   CHOLHDL 3.3 10/17/2023 09:51 AM   CHOLHDL 6.7 08/12/2016 12:48 AM   LDLCALC 45 10/17/2023 09:51 AM    Wt Readings from Last 3 Encounters:  12/15/23 236 lb (107 kg)  12/10/23 240 lb (108.9 kg)  10/24/23 245 lb (111.1 kg)     Objective:    Vital Signs:  BP 131/64   Pulse 76   Ht 5\' 11"  (1.803 m)   Wt 240 lb (108.9 kg)   BMI 33.47 kg/m      General: Alert, oriented x3, no distress, mildly obese Head: no evidence of trauma, PERRL, EOMI, no exophtalmos or lid lag, no myxedema, no xanthelasma; normal ears, nose and oropharynx Neck: normal jugular venous pulsations and no hepatojugular reflux; brisk carotid pulses without delay and no carotid bruits Chest: clear to auscultation, no signs of consolidation by percussion or palpation, normal fremitus, symmetrical and full respiratory excursions Cardiovascular: normal position and quality of the apical impulse, regular rhythm, normal first and widely split second heart sounds, no murmurs, rubs or gallops Abdomen: no tenderness or distention, no masses by palpation, no abnormal pulsatility or arterial bruits, normal bowel sounds, no hepatosplenomegaly Extremities: Wearing compression stockings.  Bilateral lower extremity edema little worse on the right.  Scars of total knee replacement on the right.  Palpable  pulses in both feet, although weaker on the left. Neurological: grossly  nonfocal Psych: Normal mood and affect    ASSESSMENT & PLAN:    1. Chronic combined systolic and diastolic CHF, NYHA class 2 (HCC)   2. Paroxysmal atrial fibrillation (HCC)   3. Acquired thrombophilia (HCC)   4. On amiodarone therapy   5. Coronary artery disease involving native coronary artery of native heart without angina pectoris   6. Essential hypertension   7. Hypercholesterolemia   8. Diabetic polyneuropathy associated with type 2 diabetes mellitus (HCC)   9. Tobacco abuse   10. Peripheral venous insufficiency   11. PAD (peripheral artery disease) (HCC)               CHF: NYHA functional class I, clinically euvolemic on a relatively low-dose of loop diuretic.  Has longstanding issues with lower extremity edema due to peripheral venous insufficiency, compensated with compression stockings.  Decompensation earlier this year appeared to be due to atrial fibrillation with rapid ventricular response AFib: He has always been arrhythmia unaware.  COVID after initiation of amiodarone.  Remains on appropriate anticoagulation.  On anticoagulation.  CHADSVasc 6 (age, CVA 2, HTN, DM, CAD).  Eliquis: Remote history of nephrolithiasis related hematuria, no recent problems.   Amiodarone: Normal TSH in July 2024, normal liver function tests in October 2024.  Reminded him to have a yearly eye exam and to promptly report any unexpected respiratory complaints.  We have baseline pulmonary function tests from last June which showed mildly reduced FEV1 and mildly reduced FVC. CAD s/p CABG: Asymptomatic despite being physically active. HTN: Borderline high at blood pressure on arrival, in desirable range on recheck. HLP: HDL is chronically low, but he has an excellent LDL.  Continue statin DM: Markedly improved on treatment with Rybelsus and glipizide; no longer taking SGLT2 inhibitor. Tobacco abuse: Congratulated him on smoking cessation. Obesity: Remains mildly obese.  Encouraged weight loss. ILR:     Device is at end of service.  Left in place. Venous insufficiency of the lower extremities: Continue compression stockings and leg elevation. PAD LLE: occlusion left SFA and significant infrapopliteal disease.  Stable symptoms and ABI (last checked March 2024 ABI 0.74 on the left side, normal on the right). Saw Dr. Gretta Began on April 15, 2019, conservative management recommended.  Told him to promptly report any wounds or ulcerations on his feet, especially if on the left side.   COVID-19 Education: The signs and symptoms of COVID-19 were discussed with the patient and how to seek care for testing (follow up with PCP or arrange E-visit).  The importance of social distancing was discussed today.  Time:   Today, I have spent 15 minutes with the patient with telehealth technology discussing the above problems.     Medication Adjustments/Labs and Tests Ordered: Current medicines are reviewed at length with the patient today.  Concerns regarding medicines are outlined above.   Tests Ordered: Orders Placed This Encounter  Procedures   EKG 12-Lead    Medication Changes: No orders of the defined types were placed in this encounter.   Follow Up:  In Person  1 year  Signed, Thurmon Fair, MD  12/15/2023 6:00 PM    [facility]

## 2023-12-10 NOTE — Telephone Encounter (Signed)
The only way to know whether his meter is broken versus that of his blood sugars really are elevated is to have his blood sugar level checked in the emergency room, urgent care, or I suppose at primary care office though I am not sure if this would be considered a lab or nurse visit.  I would agree that if it has been reading as high since Friday the emergency room would be the most appropriate

## 2023-12-10 NOTE — Telephone Encounter (Signed)
  Chief Complaint: high blood sugar Symptoms: high glucose reading "HIGH" x3 days, increased urinary frequency, blood in urine yesterday Frequency: Constant x 3 days Pertinent Negatives: Patient denies SOB, weakness, dizziness, flank pain Disposition: [] ED /[] Urgent Care (no appt availability in office) / [] Appointment(In office/virtual)/ []  [employer] [facility]/ [] Home Care/ [x] Refused Recommended Disposition /[] [facility] [facility]/ []  Follow-up with PCP Additional Notes: Pt called stating he has had high readings on his glucometer for 3 days. States it was initially in the high 300s and is now reading "high". Pt states he has increased his fluid intake because of the readings and is having increased urinary frequency. Pt states yesterday he went to the store and walked around for approx 1 hour and then had dark red blood in urine. Pt repeatedly states "I feel fine". Reports he was seen by cardiologist this morning and all vitals are stable, blood sugar was not obtained during appt. Per protocol, pt to be evaluated in ED. Pt declines dispo, states his glucometer is approx 56-29 years old and thinks it is malfunctioned- states he wants to be seen in office by PCP. Next available appt 01/15/24. Pt request PCP call back with instructions. Alerting PCP for review.   Copied from CRM [id number]. Topic: Clinical - Red Word Triage >> Dec 10, 2023  9:51 AM Larwance Sachs wrote: Red Word that prompted transfer to Nurse Triage: Patient called in regarding blood sugar monitor, states blood sugar is reading super high. Patient stated he has not done anything to elevate blood sugar, as well as feeling fine  stated maybe montor is defective but he would like to be sure Reason for Disposition  Blood glucose > 500 mg/dL (84.1 mmol/L)  Answer Assessment - Initial Assessment Questions 1. BLOOD GLUCOSE: "What is your blood glucose level?"      360s now reading "high" started reading abnormal 12/14 2. ONSET: "When did you  check the blood glucose?"     Approx 10 minutes ago- read High, this morning at 0530- 363 3. USUAL RANGE: "What is your glucose level usually?" (e.g., usual fasting morning value, usual evening value)     120 or below 4. KETONES: "Do you check for ketones (urine or blood test strips)?" If Yes, ask: "What does the test show now?"      denies 5. TYPE 1 or 2:  "Do you know what type of diabetes you have?"  (e.g., Type 1, Type 2, Gestational; doesn't know)      Type 2 6. INSULIN: "Do you take insulin?" "What type of insulin(s) do you use? What is the mode of delivery? (syringe, pen; injection or pump)?"      Denies 7. DIABETES PILLS: "Do you take any pills for your diabetes?" If Yes, ask: "Have you missed taking any pills recently?"     Yes, denies missing any 8. OTHER SYMPTOMS: "Do you have any symptoms?" (e.g., fever, frequent urination, difficulty breathing, dizziness, weakness, vomiting)     Increased urination but has increased water intake to help bring BS down  Protocols used: Diabetes - High Blood Sugar-A-AH

## 2023-12-11 ENCOUNTER — Ambulatory Visit (INDEPENDENT_AMBULATORY_CARE_PROVIDER_SITE_OTHER): Payer: PPO

## 2023-12-11 DIAGNOSIS — Z7984 Long term (current) use of oral hypoglycemic drugs: Secondary | ICD-10-CM

## 2023-12-11 DIAGNOSIS — E669 Obesity, unspecified: Secondary | ICD-10-CM | POA: Diagnosis not present

## 2023-12-11 DIAGNOSIS — E1169 Type 2 diabetes mellitus with other specified complication: Secondary | ICD-10-CM

## 2023-12-11 LAB — GLUCOSE, POCT (MANUAL RESULT ENTRY): POC Glucose: 259 mg/dL — AB (ref 70–99)

## 2023-12-11 MED ORDER — FREESTYLE LIBRE READER DEVI: 1.0000 | Freq: Every day | 1 refills | Status: AC

## 2023-12-11 NOTE — Progress Notes (Signed)
Patient presented to office because he said that something was wrong with his meter. POC glucose was performed with a reading of 259. Patient then expressed he needs a new sensor reader. Reader script has been sent to pharmacy.

## 2023-12-15 ENCOUNTER — Encounter: Payer: Self-pay | Admitting: Cardiovascular Disease

## 2023-12-15 ENCOUNTER — Emergency Department (HOSPITAL_BASED_OUTPATIENT_CLINIC_OR_DEPARTMENT_OTHER)
Admission: EM | Admit: 2023-12-15 | Discharge: 2023-12-15 | Disposition: A | Discharge status: Home / Self Care | Payer: PPO | Attending: Emergency Medicine | Admitting: Emergency Medicine

## 2023-12-15 ENCOUNTER — Other Ambulatory Visit: Payer: Self-pay

## 2023-12-15 ENCOUNTER — Encounter (HOSPITAL_BASED_OUTPATIENT_CLINIC_OR_DEPARTMENT_OTHER): Payer: Self-pay

## 2023-12-15 ENCOUNTER — Emergency Department (HOSPITAL_BASED_OUTPATIENT_CLINIC_OR_DEPARTMENT_OTHER): Payer: PPO

## 2023-12-15 DIAGNOSIS — K5730 Diverticulosis of large intestine without perforation or abscess without bleeding: Secondary | ICD-10-CM | POA: Diagnosis not present

## 2023-12-15 DIAGNOSIS — Z7901 Long term (current) use of anticoagulants: Secondary | ICD-10-CM | POA: Insufficient documentation

## 2023-12-15 DIAGNOSIS — R319 Hematuria, unspecified: Secondary | ICD-10-CM | POA: Diagnosis not present

## 2023-12-15 DIAGNOSIS — E119 Type 2 diabetes mellitus without complications: Secondary | ICD-10-CM | POA: Diagnosis not present

## 2023-12-15 DIAGNOSIS — Z7984 Long term (current) use of oral hypoglycemic drugs: Secondary | ICD-10-CM | POA: Diagnosis not present

## 2023-12-15 DIAGNOSIS — R109 Unspecified abdominal pain: Secondary | ICD-10-CM | POA: Diagnosis not present

## 2023-12-15 DIAGNOSIS — N200 Calculus of kidney: Secondary | ICD-10-CM

## 2023-12-15 DIAGNOSIS — I4891 Unspecified atrial fibrillation: Secondary | ICD-10-CM | POA: Diagnosis not present

## 2023-12-15 DIAGNOSIS — N132 Hydronephrosis with renal and ureteral calculous obstruction: Secondary | ICD-10-CM | POA: Insufficient documentation

## 2023-12-15 LAB — URINALYSIS, ROUTINE W REFLEX MICROSCOPIC
Bacteria, UA: NONE SEEN
Bilirubin Urine: NEGATIVE
Glucose, UA: >1000 mg/dL — AB
Ketones, ur: NEGATIVE mg/dL
Leukocytes,Ua: NEGATIVE
Nitrite: NEGATIVE
Protein, ur: 30 mg/dL — AB
RBC / HPF: >50 RBC/hpf (ref 0–5)
Specific Gravity, Urine: 1.026 (ref 1.005–1.030)
pH: 5.5 (ref 5.0–8.0)

## 2023-12-15 MED ORDER — TAMSULOSIN HCL 0.4 MG PO CAPS
0.4000 mg | ORAL_CAPSULE | Freq: Once | ORAL | Status: AC
  Administered 2023-12-15: 0.4 mg via ORAL
  Filled 2023-12-15: qty 1

## 2023-12-15 MED ORDER — MORPHINE SULFATE 15 MG PO TABS: 15.0000 mg | ORAL_TABLET | Freq: Three times a day (TID) | ORAL | 0 refills | Status: AC | PRN

## 2023-12-15 MED ORDER — TAMSULOSIN HCL 0.4 MG PO CAPS: 0.4000 mg | ORAL_CAPSULE | Freq: Every day | ORAL | 0 refills | Status: DC

## 2023-12-15 NOTE — ED Triage Notes (Signed)
In for eval of urinary frequency with intermittent hematuria x1 week. Reports dull aching pain to left flank. Denies dysuria, nausea, or vomiting.

## 2023-12-15 NOTE — ED Provider Notes (Signed)
[place] [facility] AT [facility] Provider Note   CSN: [id number] Arrival date & time: 12/15/23  1701     History  Chief Complaint  Patient presents with   Hematuria    [name] is a 72 y.o. male.  With a history of atrial fibrillation on Eliquis, type 2 diabetes, heart failure and kidney stones who presents to the ED for flank pain.  1 week of left-sided flank pain with a few episodes of hematuria.  No fevers chills nausea vomiting abdominal pain.  Has required lithotripsy in the past.  Sporadic episodes of flank pain but no known recent kidney stones.  Feels as though he may have passed a kidney stone few nights ago   Hematuria       Home Medications Prior to Admission medications   Medication Sig Start Date End Date Taking? Authorizing Provider  morphine (MSIR) 15 MG tablet Take 1 tablet (15 mg total) by mouth every 8 (eight) hours as needed for up to 3 days for severe pain (pain score 7-10). 12/15/23 12/18/23 Yes Royanne Foots, DO  tamsulosin (FLOMAX) 0.4 MG CAPS capsule Take 1 capsule (0.4 mg total) by mouth daily. 12/15/23  Yes Royanne Foots, DO  amiodarone (PACERONE) 200 MG tablet Take 2 tablets twice daily for 1 week, then 2 tablets once daily for a week, then 200 mg 1 tablet once daily thereafter 04/04/23   Croitoru, Mihai, MD  apixaban (ELIQUIS) 5 MG TABS tablet Take 1 tablet (5 mg total) by mouth 2 (two) times daily. 08/09/23   Croitoru, Mihai, MD  atorvastatin (LIPITOR) 10 MG tablet Take 1 tablet (10 mg total) by mouth daily. 10/24/23   Melida Quitter, PA  blood glucose meter kit and supplies KIT Dispense based on patient and insurance preference. 10/20/20   Mayer Masker, PA-C  Continuous Glucose Receiver (FREESTYLE LIBRE READER) DEVI 1 Device by Does not apply route daily. 12/11/23   Melida Quitter, PA  Continuous Glucose Sensor (FREESTYLE LIBRE 14 DAY SENSOR) MISC See admin instructions 09/17/23   Saralyn Pilar A, PA   furosemide (LASIX) 20 MG tablet Take 1 tablet (20 mg total) by mouth as directed. Take 20 mg on  Mon., Wens., Friday. Take 40 mg Tues.,Thurs.,Sat., On Sunday Take 20 mg. 06/19/23   Jodelle Gross, NP  glipiZIDE (GLUCOTROL) 10 MG tablet Take 1 tablet (10 mg total) by mouth 2 (two) times daily before a meal. 10/24/23   Saralyn Pilar A, PA  glucose blood (FREESTYLE LITE) test strip Use to check blood sugars every morning fasting 04/02/18   Danford, Orpha Bur D, NP  lisinopril (ZESTRIL) 5 MG tablet Take 5 mg by mouth daily. 06/08/23   [provider]  Multiple Vitamin (MULTIVITAMIN WITH MINERALS) TABS tablet Take 1 tablet by mouth daily. Men's One-A-Day Multivitamin    [provider]  pregabalin (LYRICA) 50 MG capsule Take 1 capsule (50 mg total) by mouth 2 (two) times daily. If ineffective, may increase to 2 tablets 2 times daily. 10/24/23   Melida Quitter, PA  promethazine-dextromethorphan (PROMETHAZINE-DM) 6.25-15 MG/5ML syrup Take 5 mLs by mouth 4 (four) times daily as needed for cough. 09/29/23   Tomi Bamberger, PA-C  Semaglutide (RYBELSUS) 7 MG TABS Take 7 mg by mouth daily.    Saralyn Pilar A, PA  Trospium Chloride 60 MG CP24 Take 1 capsule (60 mg total) by mouth daily. 08/30/23   Harle Battiest, PA-C      Allergies  Bee venom and Penicillins    Review of Systems   Review of Systems  Genitourinary:  Positive for hematuria.    Physical Exam Updated Vital Signs BP 114/88   Pulse 73   Temp 98 F (36.7 C) (Oral)   Resp 16   Ht 5\' 10"  (1.778 m)   Wt 107 kg   SpO2 97%   BMI 33.86 kg/m  Physical Exam Vitals and nursing note reviewed.  HENT:     Head: Normocephalic and atraumatic.  Eyes:     Pupils: Pupils are equal, round, and reactive to light.  Cardiovascular:     Rate and Rhythm: Normal rate and regular rhythm.  Pulmonary:     Effort: Pulmonary effort is normal.     Breath sounds: Normal breath sounds.  Abdominal:     Palpations: Abdomen is soft.      Tenderness: There is no abdominal tenderness. There is left CVA tenderness.  Skin:    General: Skin is warm and dry.  Neurological:     Mental Status: He is alert.  Psychiatric:        Mood and Affect: Mood normal.     ED Results / Procedures / Treatments   Labs (all labs ordered are listed, but only abnormal results are displayed) Labs Reviewed  URINALYSIS, ROUTINE W REFLEX MICROSCOPIC - Abnormal; Notable for the following components:      Result Value   Glucose, UA >1,000 (*)    Hgb urine dipstick LARGE (*)    Protein, ur 30 (*)    All other components within normal limits    EKG None  Radiology CT Renal Stone Study Result Date: 12/15/2023 CLINICAL DATA:  Left flank pain and hematuria. EXAM: CT ABDOMEN AND PELVIS WITHOUT CONTRAST TECHNIQUE: Multidetector CT imaging of the abdomen and pelvis was performed following the standard protocol without IV contrast. RADIATION DOSE REDUCTION: This exam was performed according to the departmental dose-optimization program which includes automated exposure control, adjustment of the mA and/or kV according to patient size and/or use of iterative reconstruction technique. COMPARISON:  March 27, 2021 FINDINGS: Lower chest: No acute abnormality. Hepatobiliary: No focal liver abnormality is seen. No gallstones, gallbladder wall thickening, or biliary dilatation. Pancreas: Unremarkable. No pancreatic ductal dilatation or surrounding inflammatory changes. Spleen: Normal in size without focal abnormality. Adrenals/Urinary Tract: Adrenal glands are unremarkable. Kidneys are normal in size without focal lesions. A 2 mm nonobstructing renal calculus is seen within the mid to upper left kidney. Mild right-sided hydroureter is seen without evidence of obstructing renal calculi. Bladder is unremarkable. Stomach/Bowel: Stomach is within normal limits. Appendix appears normal. No evidence of bowel wall thickening, distention, or inflammatory changes.  Noninflamed diverticula are seen throughout the descending and sigmoid colon. Vascular/Lymphatic: Aortic atherosclerosis with stable 2.5 cm diameter aneurysmal dilatation of the infrarenal abdominal aorta. No enlarged abdominal or pelvic lymph nodes. Reproductive: Multiple brachytherapy seeds are seen within a normal sized prostate gland. Other: No abdominal wall hernia or abnormality. No abdominopelvic ascites. Musculoskeletal: Multilevel degenerative changes are seen throughout the lumbar spine. IMPRESSION: 1. 2 mm nonobstructing left renal calculus. 2. Mild right-sided hydroureter without evidence of obstructing renal calculi. 3. Colonic diverticulosis. 4. Stable 2.5 cm diameter aneurysmal dilatation of the infrarenal abdominal aorta. 5. Aortic atherosclerosis. Aortic Atherosclerosis (ICD10-I70.0). Electronically Signed   By: Aram Candela M.D.   On: 12/15/2023 20:22    Procedures Procedures    Medications Ordered in ED Medications  tamsulosin (FLOMAX) capsule 0.4 mg (has no administration in  time range)    ED Course/ Medical Decision Making/ A&P Clinical Course as of 12/15/23 2150  Sat Dec 15, 2023  2147 CT shows 2 mm left-sided stoneAgo nonobstructing with mild right-sided hydroureter without evidence of obstruction other chronic findings.  Patient has remained stable here.  Will give him a couple doses of morphine for severe pain only and a prescription for Flomax.  He will follow-up with his urologist in [place].  Stable for discharge [MP]    Clinical Course User Index [MP] Royanne Foots, DO                                 Medical Decision Making 72 year old male with history as above including history of kidney stones presenting for flank pain and hematuria.  1 week of left-sided flank pain episodic.  No pain currently.  Hematuria.  No dysuria or trouble voiding.  No fevers or chills.  Some left CVA tenderness on exam.  Given hematuria for the last week and history of kidney  stones requiring lithotripsy will obtain CT renal study and continue to monitor.  Patient declines offer for analgesics at this time given that he is not in pain  Amount and/or Complexity of Data Reviewed Labs: ordered. Radiology: ordered.  Risk Prescription drug management.           Final Clinical Impression(s) / ED Diagnoses Final diagnoses:  Kidney stone on left side    Rx / DC Orders ED Discharge Orders          Ordered    tamsulosin (FLOMAX) 0.4 MG CAPS capsule  Daily        12/15/23 2150    morphine (MSIR) 15 MG tablet  Every 8 hours PRN        12/15/23 2150              Royanne Foots, DO 12/15/23 2150

## 2023-12-15 NOTE — Discharge Instructions (Signed)
You were seen in the department for blood in your urine and left flank pain The CAT scan showed a kidney stone on the left which is 2 mm This is small enough to pass We gave you a prescription for Flomax for you to pick up to help pass kidney stones We also gave you a few doses of morphine to take only for severe pain Do not drink alcohol or drive while taking morphine Take Tylenol for mild to moderate pain Follow-up with your urologist in [place] to discuss kidney stone care Return to the emerged part for severe pain, if you are unable to pee or have any other concerns

## 2023-12-17 ENCOUNTER — Telehealth: Payer: Self-pay

## 2023-12-17 DIAGNOSIS — E669 Obesity, unspecified: Secondary | ICD-10-CM

## 2023-12-17 MED ORDER — FREESTYLE LIBRE 3 SENSOR MISC
1.0000 | Freq: Every day | 3 refills | Status: DC
Start: 2023-12-17 — End: 2024-04-22

## 2023-12-17 MED ORDER — FREESTYLE LIBRE 3 READER DEVI: 1.0000 | Freq: Every day | 0 refills | Status: AC

## 2023-12-17 NOTE — Telephone Encounter (Signed)
Copied from CRM [id number]. Topic: Clinical - Prescription Issue >> Dec 17, 2023  9:37 AM [name] wrote: Reason for CRM: [facility] pharmacy advised to the pt that his Rx for Continuous Glucose Receiver (FREESTYLE LIBRE READER) DEVICE is for a #1 and they are not available right now. Pharmacy needs a new rx for a #3 device. Please send asap.

## 2023-12-17 NOTE — Telephone Encounter (Signed)
Corrected script was sent to [facility].

## 2024-01-17 ENCOUNTER — Telehealth: Payer: Self-pay | Admitting: *Deleted

## 2024-01-17 NOTE — Telephone Encounter (Addendum)
Informed patient that his patient assistance Rybelsus had arrived. Is is in the cabinet at the nurse station.

## 2024-01-25 ENCOUNTER — Ambulatory Visit (INDEPENDENT_AMBULATORY_CARE_PROVIDER_SITE_OTHER): Payer: PPO | Admitting: Family Medicine

## 2024-01-25 ENCOUNTER — Encounter: Payer: Self-pay | Admitting: Family Medicine

## 2024-01-25 VITALS — BP 136/72 | HR 66 | Ht 70.0 in | Wt 243.8 lb

## 2024-01-25 DIAGNOSIS — E1142 Type 2 diabetes mellitus with diabetic polyneuropathy: Secondary | ICD-10-CM | POA: Diagnosis not present

## 2024-01-25 DIAGNOSIS — E782 Mixed hyperlipidemia: Secondary | ICD-10-CM

## 2024-01-25 DIAGNOSIS — E1169 Type 2 diabetes mellitus with other specified complication: Secondary | ICD-10-CM | POA: Diagnosis not present

## 2024-01-25 DIAGNOSIS — E669 Obesity, unspecified: Secondary | ICD-10-CM

## 2024-01-25 DIAGNOSIS — Z23 Encounter for immunization: Secondary | ICD-10-CM | POA: Diagnosis not present

## 2024-01-25 DIAGNOSIS — Z7984 Long term (current) use of oral hypoglycemic drugs: Secondary | ICD-10-CM

## 2024-01-25 LAB — POCT GLYCOSYLATED HEMOGLOBIN (HGB A1C): Hemoglobin A1C: 11 % — AB (ref 4.0–5.6)

## 2024-01-25 LAB — POCT UA - MICROALBUMIN
Creatinine, POC: 50 mg/dL
Microalbumin Ur, POC: 150 mg/L

## 2024-01-25 MED ORDER — PREGABALIN 100 MG PO CAPS
100.0000 mg | ORAL_CAPSULE | Freq: Two times a day (BID) | ORAL | 1 refills | Status: DC
Start: 2024-01-25 — End: 2024-08-04

## 2024-01-25 MED ORDER — FREESTYLE LIBRE 14 DAY SENSOR MISC
3 refills | Status: DC
Start: 2024-01-25 — End: 2024-08-13

## 2024-01-25 MED ORDER — METFORMIN HCL ER 500 MG PO TB24
500.0000 mg | ORAL_TABLET | Freq: Two times a day (BID) | ORAL | 1 refills | Status: DC
Start: 2024-01-25 — End: 2024-04-22

## 2024-01-25 MED ORDER — GLIPIZIDE 10 MG PO TABS
10.0000 mg | ORAL_TABLET | Freq: Two times a day (BID) | ORAL | 1 refills | Status: DC
Start: 2024-01-25 — End: 2024-10-28

## 2024-01-25 MED ORDER — TAMSULOSIN HCL 0.4 MG PO CAPS: 0.4000 mg | ORAL_CAPSULE | Freq: Every day | ORAL | 0 refills | Status: DC

## 2024-01-25 MED ORDER — ATORVASTATIN CALCIUM 10 MG PO TABS
10.0000 mg | ORAL_TABLET | Freq: Every day | ORAL | 0 refills | Status: DC
Start: 2024-01-25 — End: 2024-07-25

## 2024-01-25 NOTE — Progress Notes (Signed)
Established Patient Office Visit  Subjective   Patient ID: [name], male    DOB: 01-29-1951  Age: 73 y.o. MRN: [id number]  Chief Complaint  Patient presents with   Hypertension   Diabetes    HPI KEYEN [name] is a 73 y.o. male presenting today for follow up of diabetes, neuropathy.  Diabetes: denies hypoglycemic events, wounds or sores that are not healing well, increased thirst or urination. Denies vision problems, eye exam up-to-date 08/21/2023. Taking Rybelsus and glipizide as prescribed without any side effects.  He states that he "watches what he eats", in conversation he shared that he goes to [employer] to get a cheese, sausage, egg McMuffin most mornings.  For lunch he will sometimes do lunch meat, cheese, and nabs.  Dinner changes, last night he went to [employer] and had orange chicken with noodles.  He also says that he will sometimes have an oatmeal cookie but scrapes out most of the icing.  He also reports that he did meet with a nutritionist at one point. Neuropathy: At last appointment discussed that he should not be taking gabapentin at the same time as pregabalin.  Patient brought both bottles to the office today and reports that he has been taking both of them.  Outpatient Medications Prior to Visit  Medication Sig   amiodarone (PACERONE) 200 MG tablet Take 2 tablets twice daily for 1 week, then 2 tablets once daily for a week, then 200 mg 1 tablet once daily thereafter   apixaban (ELIQUIS) 5 MG TABS tablet Take 1 tablet (5 mg total) by mouth 2 (two) times daily.   blood glucose meter kit and supplies KIT Dispense based on patient and insurance preference.   Continuous Glucose Receiver (FREESTYLE LIBRE 3 READER) DEVI 1 each by Does not apply route daily.   Continuous Glucose Receiver (FREESTYLE LIBRE READER) DEVI 1 Device by Does not apply route daily.   Continuous Glucose Sensor (FREESTYLE LIBRE 3 SENSOR) MISC 1 each by Does not apply route daily. Place 1  sensor on the skin every 14 days. Use to check glucose continuously   furosemide (LASIX) 20 MG tablet Take 1 tablet (20 mg total) by mouth as directed. Take 20 mg on  Mon., Wens., Friday. Take 40 mg Tues.,Thurs.,Sat., On Sunday Take 20 mg.   glucose blood (FREESTYLE LITE) test strip Use to check blood sugars every morning fasting   lisinopril (ZESTRIL) 5 MG tablet Take 5 mg by mouth daily.   Multiple Vitamin (MULTIVITAMIN WITH MINERALS) TABS tablet Take 1 tablet by mouth daily. Men's One-A-Day Multivitamin   promethazine-dextromethorphan (PROMETHAZINE-DM) 6.25-15 MG/5ML syrup Take 5 mLs by mouth 4 (four) times daily as needed for cough.   Semaglutide (RYBELSUS) 7 MG TABS Take 7 mg by mouth daily.   Trospium Chloride 60 MG CP24 Take 1 capsule (60 mg total) by mouth daily.   [DISCONTINUED] atorvastatin (LIPITOR) 10 MG tablet Take 1 tablet (10 mg total) by mouth daily.   [DISCONTINUED] Continuous Glucose Sensor (FREESTYLE LIBRE 14 DAY SENSOR) MISC See admin instructions   [DISCONTINUED] glipiZIDE (GLUCOTROL) 10 MG tablet Take 1 tablet (10 mg total) by mouth 2 (two) times daily before a meal.   [DISCONTINUED] pregabalin (LYRICA) 50 MG capsule Take 1 capsule (50 mg total) by mouth 2 (two) times daily. If ineffective, may increase to 2 tablets 2 times daily.   [DISCONTINUED] tamsulosin (FLOMAX) 0.4 MG CAPS capsule Take 1 capsule (0.4 mg total) by mouth daily.   No facility-administered medications  prior to visit.    ROS Negative unless otherwise noted in HPI   Objective:     BP 136/72   Pulse 66   Ht 5\' 10"  (1.778 m)   Wt 243 lb 12 oz (110.6 kg)   SpO2 96%   BMI 34.97 kg/m   Physical Exam Constitutional:      General: He is not in acute distress.    Appearance: Normal appearance.  HENT:     Head: Normocephalic and atraumatic.  Cardiovascular:     Rate and Rhythm: Normal rate and regular rhythm.     Heart sounds: Normal heart sounds. No murmur heard.    No friction rub. No gallop.   Pulmonary:     Effort: Pulmonary effort is normal. No respiratory distress.     Breath sounds: Normal breath sounds. No wheezing, rhonchi or rales.  Skin:    General: Skin is warm and dry.  Neurological:     Mental Status: He is alert and oriented to person, place, and time.  Psychiatric:        Mood and Affect: Mood normal.     Assessment & Plan:  Type 2 diabetes mellitus with obesity (HCC) Assessment & Plan: A1c today further increased to 11.0.  Continue Rybelsus 7 mg daily through [employer] patient assistance program, continue glipizide 10 mg twice daily with meals.  Restart metformin, but given history of GI upset start extended release 500 mg twice daily.  We also discussed returning to a nutritionist, this Thereasa Parkin believes that he would benefit from additional education and one-on-one support regarding what a low-carb diet means and how to track carbs.  Patient would rather restart metformin without visiting nutritionist.  Provided additional printed material for him to review and encouraged him to visit the American Diabetes Association for further education on low-carb diet.  Without making changes to his nutrition, we will likely need to further increase his medication routine in the future if A1c remains above goal.  Will continue to monitor.  Orders: -     POCT UA - Microalbumin -     POCT glycosylated hemoglobin (Hb A1C) -     FreeStyle Libre 14 Day Sensor; See admin instructions  Dispense: 6 each; Refill: 3 -     glipiZIDE; Take 1 tablet (10 mg total) by mouth 2 (two) times daily before a meal.  Dispense: 180 tablet; Refill: 1 -     metFORMIN HCl ER; Take 1 tablet (500 mg total) by mouth 2 (two) times daily with a meal.  Dispense: 180 tablet; Refill: 1  Diabetic polyneuropathy associated with type 2 diabetes mellitus (HCC) Assessment & Plan: We discussed again that he should not be taking gabapentin at the same time as the pregabalin.  Showed him the bottle of gabapentin and  recommended that he either give it to the pharmacy to dispose of or put it in the back of his cabinet.  Continue pregabalin but increase dose to 100 mg twice daily for polyneuropathy.  Maximum dose 300 mg twice daily.  Orders: -     Pregabalin; Take 1 capsule (100 mg total) by mouth 2 (two) times daily.  Dispense: 180 capsule; Refill: 1  Mixed diabetic hyperlipidemia associated with type 2 diabetes mellitus (HCC) -     Atorvastatin Calcium; Take 1 tablet (10 mg total) by mouth daily.  Dispense: 90 tablet; Refill: 0  Encounter for immunization -     Flu Vaccine Trivalent High Dose (Fluad)  Other orders -  Tamsulosin HCl; Take 1 capsule (0.4 mg total) by mouth daily.  Dispense: 30 capsule; Refill: 0    Return in about 3 months (around 04/23/2024) for follow-up for HTN, HLD, DM, fasting labs 1 week before.    Melida Quitter, PA

## 2024-01-25 NOTE — Assessment & Plan Note (Addendum)
A1c today further increased to 11.0.  Continue Rybelsus 7 mg daily through [employer] patient assistance program, continue glipizide 10 mg twice daily with meals.  Restart metformin, but given history of GI upset start extended release 500 mg twice daily.  We also discussed returning to a nutritionist, this Thereasa Parkin believes that he would benefit from additional education and one-on-one support regarding what a low-carb diet means and how to track carbs.  Patient would rather restart metformin without visiting nutritionist.  Provided additional printed material for him to review and encouraged him to visit the American Diabetes Association for further education on low-carb diet.  Without making changes to his nutrition, we will likely need to further increase his medication routine in the future if A1c remains above goal.  Will continue to monitor.

## 2024-01-25 NOTE — Patient Instructions (Addendum)
STOP taking the gabapentin. You should only be taking pregabalin for your neuropathy.  DIABETES NUTRITION: Limit the number of carbohydrates you eat in a day to less than 150 g. Read the nutrition labels for everything that you eat to calculate how many carbohydrates there are. For restaurants, you can look up the nutrition information for their menu items online. If you need extra help figuring out how to follow a low-carb diet, I can get you in touch with a nutritionist again, or you can visit the American Diabetes Association website.

## 2024-01-25 NOTE — Assessment & Plan Note (Addendum)
We discussed again that he should not be taking gabapentin at the same time as the pregabalin.  Showed him the bottle of gabapentin and recommended that he either give it to the pharmacy to dispose of or put it in the back of his cabinet.  Continue pregabalin but increase dose to 100 mg twice daily for polyneuropathy.  Maximum dose 300 mg twice daily.

## 2024-01-31 ENCOUNTER — Encounter: Payer: Self-pay | Admitting: Family Medicine

## 2024-03-08 ENCOUNTER — Other Ambulatory Visit: Payer: Self-pay | Admitting: Family Medicine

## 2024-03-18 ENCOUNTER — Other Ambulatory Visit: Payer: Self-pay

## 2024-03-18 DIAGNOSIS — C61 Malignant neoplasm of prostate: Secondary | ICD-10-CM

## 2024-03-21 ENCOUNTER — Other Ambulatory Visit: Payer: PPO

## 2024-03-21 DIAGNOSIS — C61 Malignant neoplasm of prostate: Secondary | ICD-10-CM

## 2024-03-22 LAB — PSA: Prostate Specific Ag, Serum: <0.1 ng/mL (ref 0.0–4.0)

## 2024-03-26 ENCOUNTER — Ambulatory Visit: Payer: PPO | Admitting: Urology

## 2024-04-02 ENCOUNTER — Ambulatory Visit: Payer: Self-pay | Admitting: Urology

## 2024-04-02 VITALS — BP 133/78 | HR 78 | Ht 70.0 in | Wt 245.2 lb

## 2024-04-02 DIAGNOSIS — R350 Frequency of micturition: Secondary | ICD-10-CM

## 2024-04-02 DIAGNOSIS — N200 Calculus of kidney: Secondary | ICD-10-CM

## 2024-04-02 DIAGNOSIS — N3040 Irradiation cystitis without hematuria: Secondary | ICD-10-CM

## 2024-04-02 DIAGNOSIS — Z8546 Personal history of malignant neoplasm of prostate: Secondary | ICD-10-CM | POA: Diagnosis not present

## 2024-04-02 LAB — BLADDER SCAN AMB NON-IMAGING: Scan Result: 14

## 2024-04-02 NOTE — Progress Notes (Signed)
 [name],acting as a scribe for Dustin Gimenez, MD.,have documented all relevant documentation on the behalf of Dustin Gimenez, MD,as directed by  Dustin Gimenez, MD while in the presence of Dustin Gimenez, MD.  04/02/24 3:33 PM   [name] [birth year] [id number]  Referring provider: Noreene Bearded, PA [address] [place],  [place] [address]  Chief Complaint  Patient presents with   Urinary Frequency    HPI: 73 year old male with a personal history of prostate cancer presents today for a routine annual follow-up.   He underwent brachytherapy in 2019. He had a 20 gram prostate. He has a history of nephrolithiasis and a bladder stone in 2019.He is status post ESWL in 2014. In 2023, a cystoscopy showed radiation cystitis-type changes, and he has experienced bladder irritability.   He reports a recent episode of passing a kidney stone approximately two to three months ago, accompanied by hematuria. He was treated at an urgent care facility and was prescribed medication for his bladder, which he reports has been effective.   He is currently taking Tamsulosin, which he finds beneficial. His PSA remains undetectable, and his PVR is 14.   He reports occasional urinary urgency without significant bother and acknowledges that coffee consumption exacerbates his symptoms. He is aware of the potential irritative effects of coffee on his bladder but chooses to continue drinking it.   He is active, spending time outdoors and socializing with friends over coffee.  Results for orders placed or performed in visit on 04/02/24  Bladder Scan (Post Void Residual) in office  Result Value Ref Range   Scan Result 14 ml     IPSS     Row Name 04/02/24 1300         International Prostate Symptom Score   How often have you had the sensation of not emptying your bladder? Almost always     How often have you had to urinate less than every two hours? About half the time      How often have you found you stopped and started again several times when you urinated? About half the time     How often have you found it difficult to postpone urination? About half the time     How often have you had a weak urinary stream? Almost always     How often have you had to strain to start urination? About half the time     How many times did you typically get up at night to urinate? 2 Times     Total IPSS Score 24       Quality of Life due to urinary symptoms   If you were to spend the rest of your life with your urinary condition just the way it is now how would you feel about that? Mostly Disatisfied              Score:  1-7 Mild 8-19 Moderate 20-35 Severe   PMH: Past Medical History:  Diagnosis Date   Abdominal aortic aneurysm (AAA) (HCC)    3 cm per 07-02-18 us  abdominal aorta us  epic   Arthritis    Blood clot in vein    behind left knee   Cerebral infarction (HCC) 08/11/2016   Chronic venous insufficiency LOWER EXTREMITIES   Coronary artery disease CARDIOLOGIST - DR  [id number]-  LAST 1 WK AGO -- WILL REQUEST NOTE AND STRESS TEST   Diabetes mellitus without complication (HCC)  type 2   Hematuria    last year   History of kidney stones    Hyperglycemia    Hyperlipidemia    Hypertension    Malignant neoplasm of prostate (HCC) 08/18/2011   gleason 8 Formatting of this note might be different from the original. Formatting of this note might be different from the original. gleason 8     Mixed dyslipidemia    Myocardial infarction (HCC)    Neuropathy    toes only   Nocturia    Paraseptal emphysema (HCC) 03/19/2018   Prostate cancer (HCC) 08/18/2011   gleason 8, volume 24.4cc   S/P CABG x 4    ST elevation MI (STEMI) (HCC) 02/10/2008   S/P CABG   Stroke (HCC) 2016   occ trouble wriing with right hand   Urinary hesitancy    Vision abnormalities    resolved now   Wound discharge    since jan 2019 goes to wound center [place] left great toe small  open area changes dressing q 2 days with ointment provided by wound center, clear drainage occ    Surgical History: Past Surgical History:  Procedure Laterality Date   CARDIAC CATHETERIZATION  05/01/2012   grafts widely patent   CARDIOVASCULAR STRESS TEST  03-15-2010   INFERIOR WALL SCAR WITHOUT ANY MEANINGFUL ISCHEMIA/ EF 46% / LOW RISK SCAN   CATARACT EXTRACTION     CORONARY ARTERY BYPASS GRAFT  02-10-2008  DR Berry [place]   X4 VESSEL DISEASE / EMERGANT CABG   CYSTOSCOPY  02/08/2012   Procedure: CYSTOSCOPY FLEXIBLE;  Surgeon: Trent Frizzle, MD;  Location: [facility];  Service: Urology;;   CYSTOSCOPY WITH RETROGRADE PYELOGRAM, URETEROSCOPY AND STENT PLACEMENT Right 09/12/2018   Procedure: CYSTOSCOPY WITH RETROGRADE PYELOGRAM, URETEROSCOPY AND STENT PLACEMENT, STONE EXTRACTION;  Surgeon: Trent Frizzle, MD;  Location: [facility];  Service: Urology;  Laterality: Right;   EP IMPLANTABLE DEVICE N/A 08/14/2016   Procedure: Loop Recorder Insertion;  Surgeon: Tammie Fall, MD;  Location: [facility];  Service: Cardiovascular;  Laterality: N/A;   EXTRACORPOREAL SHOCK WAVE LITHOTRIPSY  2013   HOLMIUM LASER APPLICATION Right 09/12/2018   Procedure: HOLMIUM LASER APPLICATION;  Surgeon: Trent Frizzle, MD;  Location: [facility];  Service: Urology;  Laterality: Right;   KNEE SURGERY  age 86   RIGHT   LEFT HEART CATHETERIZATION WITH CORONARY/GRAFT ANGIOGRAM N/A 05/01/2012   Procedure: LEFT HEART CATHETERIZATION WITH Estella Helling;  Surgeon: Luana Rumple, MD;  Location: [facility];  Service: Cardiovascular;  Laterality: N/A;   MULTIPLE TEETH EXTRACTIONS (23)/ FOUR QUADRANT ALVEOLPLASTY/ MANDIBULAR LATERAL EXOSTOSES REDUCTIONS  03-24-2010   CHRONIC PERIODONTITIS   RADIOACTIVE SEED IMPLANT  02/08/2012   Procedure: RADIOACTIVE SEED IMPLANT;  Surgeon: Trent Frizzle, MD;  Location: [facility];  Service: Urology;  Laterality: N/A;  C-ARM    SHOULDER SURGERY   1982   LEFT   TEE WITHOUT CARDIOVERSION N/A 08/14/2016   Procedure: TRANSESOPHAGEAL ECHOCARDIOGRAM (TEE);  Surgeon: Loyde Rule, MD;  Location: [facility];  Service: Cardiovascular;  Laterality: N/A;   TOTAL KNEE ARTHROPLASTY Right 11/27/2022   Procedure: TOTAL KNEE ARTHROPLASTY;  Surgeon: Christie Cox, MD;  Location: [facility];  Service: Orthopedics;  Laterality: Right;   URETERAL STENT PLACEMENT      Home Medications:  Allergies as of 04/02/2024       Reactions   Bee Venom Anaphylaxis   Penicillins Other (See Comments)   CAUSES "FREE BLEEDING" Has patient had a PCN reaction causing  immediate rash, facial/tongue/throat swelling, SOB or lightheadedness with hypotension: No Has patient had a PCN reaction causing severe rash involving mucus membranes or skin necrosis: No Has patient had a PCN reaction that required hospitalization No Has patient had a PCN reaction occurring within the last 10 years: No If all of the above answers are "NO", then may proceed with Cephalosporin use. Other Reaction(s): Other (See Comments) CAUSES "FREE BLEEDING", Has patient had a PCN reaction causing immediate rash, facial/tongue/throat swelling, SOB or lightheadedness with hypotension: No, Has patient had a PCN reaction causing severe rash involving mucus membranes or skin necrosis: No, Has patient had a PCN reaction that required hospitalization No, Has patient had a PCN reaction occurring within the last 10 years: No, If all of the above answers are "NO", then may proceed with Cephalosporin use.        Medication List        Accurate as of April 02, 2024  3:33 PM. If you have any questions, ask your nurse or doctor.          STOP taking these medications    Trospium Chloride 60 MG Cp24       TAKE these medications    amiodarone 200 MG tablet Commonly known as: PACERONE Take 2 tablets twice daily for 1 week, then 2 tablets once daily for a week, then 200 mg 1 tablet once daily thereafter    apixaban 5 MG Tabs tablet Commonly known as: ELIQUIS Take 1 tablet (5 mg total) by mouth 2 (two) times daily.   atorvastatin 10 MG tablet Commonly known as: LIPITOR Take 1 tablet (10 mg total) by mouth daily.   blood glucose meter kit and supplies Kit Dispense based on patient and insurance preference.   FreeStyle Libre 3 Sensor Misc 1 each by Does not apply route daily. Place 1 sensor on the skin every 14 days. Use to check glucose continuously   FreeStyle Libre 14 Day Sensor Misc See admin instructions   FreeStyle Libre Reader Devi 1 Device by Does not apply route daily.   FreeStyle Libre 3 Reader Axson 1 each by Does not apply route daily.   furosemide 20 MG tablet Commonly known as: LASIX Take 1 tablet (20 mg total) by mouth as directed. Take 20 mg on  Mon., Wens., Friday. Take 40 mg Tues.,Thurs.,Sat., On Sunday Take 20 mg.   glipiZIDE 10 MG tablet Commonly known as: GLUCOTROL Take 1 tablet (10 mg total) by mouth 2 (two) times daily before a meal.   glucose blood test strip Commonly known as: FREESTYLE LITE Use to check blood sugars every morning fasting   lisinopril 5 MG tablet Commonly known as: ZESTRIL Take 1 tablet by mouth once daily   metFORMIN 500 MG 24 hr tablet Commonly known as: GLUCOPHAGE-XR Take 1 tablet (500 mg total) by mouth 2 (two) times daily with a meal.   multivitamin with minerals Tabs tablet Take 1 tablet by mouth daily. Men's One-A-Day Multivitamin   pregabalin 100 MG capsule Commonly known as: Lyrica Take 1 capsule (100 mg total) by mouth 2 (two) times daily.   promethazine-dextromethorphan 6.25-15 MG/5ML syrup Commonly known as: PROMETHAZINE-DM Take 5 mLs by mouth 4 (four) times daily as needed for cough.   Rybelsus 7 MG Tabs Generic drug: Semaglutide Take 7 mg by mouth daily.   tamsulosin 0.4 MG Caps capsule Commonly known as: FLOMAX Take 1 capsule by mouth once daily        Allergies:  Allergies  Allergen  Reactions    Bee Venom Anaphylaxis   Penicillins Other (See Comments)    CAUSES "FREE BLEEDING"  Has patient had a PCN reaction causing immediate rash, facial/tongue/throat swelling, SOB or lightheadedness with hypotension: No  Has patient had a PCN reaction causing severe rash involving mucus membranes or skin necrosis: No  Has patient had a PCN reaction that required hospitalization No  Has patient had a PCN reaction occurring within the last 10 years: No  If all of the above answers are "NO", then may proceed with Cephalosporin use.  Other Reaction(s): Other (See Comments)  CAUSES "FREE BLEEDING", Has patient had a PCN reaction causing immediate rash, facial/tongue/throat swelling, SOB or lightheadedness with hypotension: No, Has patient had a PCN reaction causing severe rash involving mucus membranes or skin necrosis: No, Has patient had a PCN reaction that required hospitalization No, Has patient had a PCN reaction occurring within the last 10 years: No, If all of the above answers are "NO", then may proceed with Cephalosporin use.    Family History: Family History  Problem Relation Age of Onset   Cancer Father        pancreatic   Diabetes Father     Social History:  reports that he has quit smoking. His smoking use included cigarettes. He has a 25 pack-year smoking history. He has never been exposed to tobacco smoke. He has never used smokeless tobacco. He reports that he does not currently use alcohol after a past usage of about 2.0 standard drinks of alcohol per week. He reports that he does not use drugs.   Physical Exam: BP 133/78   Pulse 78   Ht 5\' 10"  (1.778 m)   Wt 245 lb 4 oz (111.2 kg)   BMI 35.19 kg/m   Constitutional:  Alert and oriented, No acute distress. HEENT: [place] AT, moist mucus membranes.  Trachea midline, no masses. Neurologic: Grossly intact, no focal deficits, moving all 4 extremities. Psychiatric: Normal mood and affect.  Pertinent Imaging: EXAM: CT ABDOMEN  AND PELVIS WITHOUT CONTRAST   TECHNIQUE: Multidetector CT imaging of the abdomen and pelvis was performed following the standard protocol without IV contrast.   RADIATION DOSE REDUCTION: This exam was performed according to the departmental dose-optimization program which includes automated exposure control, adjustment of the mA and/or kV according to patient size and/or use of iterative reconstruction technique.   COMPARISON:  March 27, 2021   FINDINGS: Lower chest: No acute abnormality.   Hepatobiliary: No focal liver abnormality is seen. No gallstones, gallbladder wall thickening, or biliary dilatation.   Pancreas: Unremarkable. No pancreatic ductal dilatation or surrounding inflammatory changes.   Spleen: Normal in size without focal abnormality.   Adrenals/Urinary Tract: Adrenal glands are unremarkable. Kidneys are normal in size without focal lesions. A 2 mm nonobstructing renal calculus is seen within the mid to upper left kidney. Mild right-sided hydroureter is seen without evidence of obstructing renal calculi. Bladder is unremarkable.   Stomach/Bowel: Stomach is within normal limits. Appendix appears normal. No evidence of bowel wall thickening, distention, or inflammatory changes. Noninflamed diverticula are seen throughout the descending and sigmoid colon.   Vascular/Lymphatic: Aortic atherosclerosis with stable 2.5 cm diameter aneurysmal dilatation of the infrarenal abdominal aorta. No enlarged abdominal or pelvic lymph nodes.   Reproductive: Multiple brachytherapy seeds are seen within a normal sized prostate gland.   Other: No abdominal wall hernia or abnormality. No abdominopelvic ascites.   Musculoskeletal: Multilevel degenerative changes are seen throughout the lumbar spine.   IMPRESSION:  1. 2 mm nonobstructing left renal calculus. 2. Mild right-sided hydroureter without evidence of obstructing renal calculi. 3. Colonic diverticulosis. 4. Stable  2.5 cm diameter aneurysmal dilatation of the infrarenal abdominal aorta. 5. Aortic atherosclerosis.   Aortic Atherosclerosis (ICD10-I70.0).     Electronically Signed   By: Virgle Grime M.D.   On: 12/15/2023 20:22   This was personally reviewed and I agree with the radiologic interpretation.  Assessment & Plan:    1. History of Prostate Cancer - His prostate cancer is currently well-controlled with an undetectable PSA level.  - He underwent brachytherapy in 2019, and the seeds are no longer emitting radiation.  - Continue annual PSA monitoring to ensure early detection of any recurrence.  2. Bladder Irritability/Radiation Cystitis - He experiences bladder irritability likely due to radiation cystitis from previous prostate cancer treatment. - Behavioral modifications, such as reducing coffee intake, were discussed to potentially alleviate irritative voiding symptoms. - He is currently taking Flomax, which has been beneficial in managing symptoms.  3. Nephrolithiasis - He has a history of kidney stones, with a recent episode occurring around December 2023.  - A CT scan from December 15, 2023, showed a punctate left-sided stone and mild hydroureter on the right side, likely due to a recently passed stone. No significant stone burden was noted.  - Adequate hydration was advised to prevent future stone formation.  Return in about 1 year (around 04/02/2025) for repeat PSA, IPSS, PVR.  I have reviewed the above documentation for accuracy and completeness, and I agree with the above.   Dustin Gimenez, MD    [facility] [address], [address] [place], [place] [address] [phone]

## 2024-04-21 ENCOUNTER — Other Ambulatory Visit: Payer: Self-pay | Admitting: *Deleted

## 2024-04-21 DIAGNOSIS — E1169 Type 2 diabetes mellitus with other specified complication: Secondary | ICD-10-CM

## 2024-04-22 ENCOUNTER — Other Ambulatory Visit: Payer: PPO

## 2024-04-22 ENCOUNTER — Ambulatory Visit: Admitting: Family Medicine

## 2024-04-22 ENCOUNTER — Encounter: Payer: Self-pay | Admitting: Family Medicine

## 2024-04-22 VITALS — BP 111/65 | HR 77 | Ht 70.0 in | Wt 243.0 lb

## 2024-04-22 DIAGNOSIS — E669 Obesity, unspecified: Secondary | ICD-10-CM | POA: Diagnosis not present

## 2024-04-22 DIAGNOSIS — E1169 Type 2 diabetes mellitus with other specified complication: Secondary | ICD-10-CM

## 2024-04-22 DIAGNOSIS — H9202 Otalgia, left ear: Secondary | ICD-10-CM

## 2024-04-22 DIAGNOSIS — Z7984 Long term (current) use of oral hypoglycemic drugs: Secondary | ICD-10-CM | POA: Diagnosis not present

## 2024-04-22 MED ORDER — FREESTYLE LIBRE 3 SENSOR MISC: 1.0000 | Freq: Every day | 3 refills | Status: DC

## 2024-04-22 NOTE — Assessment & Plan Note (Signed)
 Sudden onset 4 days ago after blowing his nose.  Mildly muffled hearing, but pain is most significant symptom.  No discharge or bleeding.  Did not attempt to irrigate due to possibility of ruptured TM.  Gently remove some cerumen with curette but was still unable to visualize TM.  Sending an urgent referral to ENT.  Advised him not to put anything in his ear such as liquids or Q-tips.

## 2024-04-22 NOTE — Assessment & Plan Note (Signed)
 Intolerant of even 500mg  metformin .  Have removed it from his med list.

## 2024-04-22 NOTE — Patient Instructions (Signed)
 It was nice to see you today,  We addressed the following topics today: -I am sending in a referral to [facility] health ear nose and throat. - Give them a few days before you call and reach out to them - If you notice any fevers, chills, drainage from the air, significant worsening in your pain then let us  know.  Have a great day,  Etha Henle, MD

## 2024-04-22 NOTE — Progress Notes (Signed)
   Acute Office Visit  Subjective:     Patient ID: [name], male    DOB: [birth year], 73 y.o.   MRN: [id number]  Chief Complaint  Patient presents with   Ear Pain    HPI Subjective: - Reports ear pain after blowing nose, heard popping noise - Pain severe, interfered with sleep - No bleeding or drainage noted - Reports muffled hearing - Pain localized to ear area  - Needs refill for Freestyle Libre 3 sensors - Unable to tolerate Metformin  - last taken about a month ago, causes GI upset - Labs ordered this morning - Follow-up appointment scheduled for 05/26/2024  Review of Systems: - Constitutional symptoms: Denies fever, chills - Eyes: No issues reported - Ears, Nose, Mouth, Throat: Ear pain, muffled hearing, no drainage    ROS      Objective:    BP 111/65   Pulse 77   Ht 5\' 10"  (1.778 m)   Wt 243 lb (110.2 kg)   SpO2 98%   BMI 34.87 kg/m    Physical Exam General: Alert, oriented HEENT: Normal right upper ear canal and tympanic membrane.  Left ear canal partially occluded by dark cerumen.  Carefully we attempted to remove some of the cerumen, was partially successful but still unable to visualize the tympanic membrane.  No results found for any visits on 04/22/24.      Assessment & Plan:   Left ear pain Assessment & Plan: Sudden onset 4 days ago after blowing his nose.  Mildly muffled hearing, but pain is most significant symptom.  No discharge or bleeding.  Did not attempt to irrigate due to possibility of ruptured TM.  Gently remove some cerumen with curette but was still unable to visualize TM.  Sending an urgent referral to ENT.  Advised him not to put anything in his ear such as liquids or Q-tips.  Orders: -     Ambulatory referral to ENT  Type 2 diabetes mellitus with obesity (HCC) Assessment & Plan: Intolerant of even 500mg  metformin .  Have removed it from his med list.   Orders: -     FreeStyle Libre 3 Sensor; 1 each by Does not apply  route daily. Place 1 sensor on the skin every 14 days. Use to check glucose continuously  Dispense: 4 each; Refill: 3     Return if symptoms worsen or fail to improve.  Laneta Pintos, MD

## 2024-04-23 ENCOUNTER — Ambulatory Visit (INDEPENDENT_AMBULATORY_CARE_PROVIDER_SITE_OTHER)

## 2024-04-23 ENCOUNTER — Encounter (INDEPENDENT_AMBULATORY_CARE_PROVIDER_SITE_OTHER): Payer: Self-pay

## 2024-04-23 VITALS — BP 130/75 | HR 76 | Ht 70.5 in | Wt 235.0 lb

## 2024-04-23 DIAGNOSIS — H9202 Otalgia, left ear: Secondary | ICD-10-CM

## 2024-04-23 DIAGNOSIS — H6122 Impacted cerumen, left ear: Secondary | ICD-10-CM | POA: Diagnosis not present

## 2024-04-23 LAB — CBC WITH DIFFERENTIAL/PLATELET
Basophils Absolute: 0.1 10*3/uL (ref 0.0–0.2)
Basos: 1 %
EOS (ABSOLUTE): 0.1 10*3/uL (ref 0.0–0.4)
Eos: 2 %
Hematocrit: 44.3 % (ref 37.5–51.0)
Hemoglobin: 14.3 g/dL (ref 13.0–17.7)
Immature Grans (Abs): 0 10*3/uL (ref 0.0–0.1)
Immature Granulocytes: 0 %
Lymphocytes Absolute: 2.8 10*3/uL (ref 0.7–3.1)
Lymphs: 31 %
MCH: 27.1 pg (ref 26.6–33.0)
MCHC: 32.3 g/dL (ref 31.5–35.7)
MCV: 84 fL (ref 79–97)
Monocytes Absolute: 0.7 10*3/uL (ref 0.1–0.9)
Monocytes: 8 %
Neutrophils Absolute: 5.2 10*3/uL (ref 1.4–7.0)
Neutrophils: 58 %
Platelets: 236 10*3/uL (ref 150–450)
RBC: 5.28 x10E6/uL (ref 4.14–5.80)
RDW: 13.8 % (ref 11.6–15.4)
WBC: 8.9 10*3/uL (ref 3.4–10.8)

## 2024-04-23 LAB — LIPID PANEL
Chol/HDL Ratio: 3.5 ratio (ref 0.0–5.0)
Cholesterol, Total: 113 mg/dL (ref 100–199)
HDL: 32 mg/dL — ABNORMAL LOW (ref >39)
LDL Chol Calc (NIH): 66 mg/dL (ref 0–99)
Triglycerides: 73 mg/dL (ref 0–149)
VLDL Cholesterol Cal: 15 mg/dL (ref 5–40)

## 2024-04-23 LAB — HEMOGLOBIN A1C
Est. average glucose Bld gHb Est-mCnc: 214 mg/dL
Hgb A1c MFr Bld: 9.1 % — ABNORMAL HIGH (ref 4.8–5.6)

## 2024-04-23 LAB — COMPREHENSIVE METABOLIC PANEL WITH GFR
ALT: 18 IU/L (ref 0–44)
AST: 17 IU/L (ref 0–40)
Albumin: 4.2 g/dL (ref 3.8–4.8)
Alkaline Phosphatase: 122 IU/L — ABNORMAL HIGH (ref 44–121)
BUN/Creatinine Ratio: 16 (ref 10–24)
BUN: 13 mg/dL (ref 8–27)
Bilirubin Total: 0.7 mg/dL (ref 0.0–1.2)
CO2: 21 mmol/L (ref 20–29)
Calcium: 9.3 mg/dL (ref 8.6–10.2)
Chloride: 100 mmol/L (ref 96–106)
Creatinine, Ser: 0.82 mg/dL (ref 0.76–1.27)
Globulin, Total: 3.1 g/dL (ref 1.5–4.5)
Glucose: 153 mg/dL — ABNORMAL HIGH (ref 70–99)
Potassium: 4.3 mmol/L (ref 3.5–5.2)
Sodium: 138 mmol/L (ref 134–144)
Total Protein: 7.3 g/dL (ref 6.0–8.5)
eGFR: 93 mL/min/{1.73_m2} (ref >59)

## 2024-04-23 LAB — TSH: TSH: 2.33 u[IU]/mL (ref 0.450–4.500)

## 2024-04-24 DIAGNOSIS — H6122 Impacted cerumen, left ear: Secondary | ICD-10-CM | POA: Insufficient documentation

## 2024-04-24 NOTE — Progress Notes (Signed)
 Patient ID: [name], male   DOB: 19-Dec-1951, 73 y.o.   MRN: [id number]  CC: Left ear pain  HPI:  [name] is a 73 y.o. male who presents today complaining of left ear pain for the past 3 days.  According to the patient, his left ear pain started acutely after he blew his nose 3 days ago.  He denies any otorrhea or hearing difficulty.  He has no recent otitis media or otitis externa.  He also denies any history of otologic surgery.  Past Medical History:  Diagnosis Date   Abdominal aortic aneurysm (AAA) (HCC)    3 cm per 07-02-18 us  abdominal aorta us  epic   Arthritis    Blood clot in vein    behind left knee   Cerebral infarction (HCC) 08/11/2016   Chronic venous insufficiency LOWER EXTREMITIES   Coronary artery disease CARDIOLOGIST - DR  [id number]-  LAST 1 WK AGO -- WILL REQUEST NOTE AND STRESS TEST   Diabetes mellitus without complication (HCC)    type 2   Hematuria    last year   History of kidney stones    Hyperglycemia    Hyperlipidemia    Hypertension    Malignant neoplasm of prostate (HCC) 08/18/2011   gleason 8 Formatting of this note might be different from the original. Formatting of this note might be different from the original. gleason 8     Mixed dyslipidemia    Myocardial infarction (HCC)    Neuropathy    toes only   Nocturia    Paraseptal emphysema (HCC) 03/19/2018   Prostate cancer (HCC) 08/18/2011   gleason 8, volume 24.4cc   S/P CABG x 4    ST elevation MI (STEMI) (HCC) 02/10/2008   S/P CABG   Stroke (HCC) 2016   occ trouble wriing with right hand   Urinary hesitancy    Vision abnormalities    resolved now   Wound discharge    since jan 2019 goes to wound center [place] left great toe small open area changes dressing q 2 days with ointment provided by wound center, clear drainage occ    Past Surgical History:  Procedure Laterality Date   CARDIAC CATHETERIZATION  05/01/2012   grafts widely patent   CARDIOVASCULAR STRESS TEST  03-15-2010    INFERIOR WALL SCAR WITHOUT ANY MEANINGFUL ISCHEMIA/ EF 46% / LOW RISK SCAN   CATARACT EXTRACTION     CORONARY ARTERY BYPASS GRAFT  02-10-2008  DR Berry Bristol   X4 VESSEL DISEASE / EMERGANT CABG   CYSTOSCOPY  02/08/2012   Procedure: CYSTOSCOPY FLEXIBLE;  Surgeon: Trent Frizzle, MD;  Location: [facility];  Service: Urology;;   CYSTOSCOPY WITH RETROGRADE PYELOGRAM, URETEROSCOPY AND STENT PLACEMENT Right 09/12/2018   Procedure: CYSTOSCOPY WITH RETROGRADE PYELOGRAM, URETEROSCOPY AND STENT PLACEMENT, STONE EXTRACTION;  Surgeon: Trent Frizzle, MD;  Location: [facility];  Service: Urology;  Laterality: Right;   EP IMPLANTABLE DEVICE N/A 08/14/2016   Procedure: Loop Recorder Insertion;  Surgeon: Tammie Fall, MD;  Location: [facility];  Service: Cardiovascular;  Laterality: N/A;   EXTRACORPOREAL SHOCK WAVE LITHOTRIPSY  2013   HOLMIUM LASER APPLICATION Right 09/12/2018   Procedure: HOLMIUM LASER APPLICATION;  Surgeon: Trent Frizzle, MD;  Location: [facility];  Service: Urology;  Laterality: Right;   KNEE SURGERY  age 35   RIGHT   LEFT HEART CATHETERIZATION WITH CORONARY/GRAFT ANGIOGRAM N/A 05/01/2012   Procedure: LEFT HEART CATHETERIZATION WITH Estella Helling;  Surgeon: Luana Rumple, MD;  Location: [facility];  Service: Cardiovascular;  Laterality: N/A;   MULTIPLE TEETH EXTRACTIONS (23)/ FOUR QUADRANT ALVEOLPLASTY/ MANDIBULAR LATERAL EXOSTOSES REDUCTIONS  03-24-2010   CHRONIC PERIODONTITIS   RADIOACTIVE SEED IMPLANT  02/08/2012   Procedure: RADIOACTIVE SEED IMPLANT;  Surgeon: Trent Frizzle, MD;  Location: [facility];  Service: Urology;  Laterality: N/A;  C-ARM    SHOULDER SURGERY  1982   LEFT   TEE WITHOUT CARDIOVERSION N/A 08/14/2016   Procedure: TRANSESOPHAGEAL ECHOCARDIOGRAM (TEE);  Surgeon: Loyde Rule, MD;  Location: [facility];  Service: Cardiovascular;  Laterality: N/A;   TOTAL KNEE ARTHROPLASTY Right 11/27/2022   Procedure: TOTAL KNEE  ARTHROPLASTY;  Surgeon: Christie Cox, MD;  Location: [facility];  Service: Orthopedics;  Laterality: Right;   URETERAL STENT PLACEMENT      Family History  Problem Relation Age of Onset   Cancer Father        pancreatic   Diabetes Father     Social History:  reports that he has quit smoking. His smoking use included cigarettes. He has a 25 pack-year smoking history. He has never been exposed to tobacco smoke. He has never used smokeless tobacco. He reports that he does not currently use alcohol after a past usage of about 2.0 standard drinks of alcohol per week. He reports that he does not use drugs.  Allergies:  Allergies  Allergen Reactions   Bee Venom Anaphylaxis   Penicillins Other (See Comments)    CAUSES "FREE BLEEDING"  Has patient had a PCN reaction causing immediate rash, facial/tongue/throat swelling, SOB or lightheadedness with hypotension: No  Has patient had a PCN reaction causing severe rash involving mucus membranes or skin necrosis: No  Has patient had a PCN reaction that required hospitalization No  Has patient had a PCN reaction occurring within the last 10 years: No  If all of the above answers are "NO", then may proceed with Cephalosporin use.  Other Reaction(s): Other (See Comments)  CAUSES "FREE BLEEDING", Has patient had a PCN reaction causing immediate rash, facial/tongue/throat swelling, SOB or lightheadedness with hypotension: No, Has patient had a PCN reaction causing severe rash involving mucus membranes or skin necrosis: No, Has patient had a PCN reaction that required hospitalization No, Has patient had a PCN reaction occurring within the last 10 years: No, If all of the above answers are "NO", then may proceed with Cephalosporin use.    Prior to Admission medications   Medication Sig Start Date End Date Taking? Authorizing Provider  amiodarone  (PACERONE ) 200 MG tablet Take 2 tablets twice daily for 1 week, then 2 tablets once daily for a week, then 200  mg 1 tablet once daily thereafter 04/04/23  Yes Croitoru, Mihai, MD  apixaban  (ELIQUIS ) 5 MG TABS tablet Take 1 tablet (5 mg total) by mouth 2 (two) times daily. 08/09/23  Yes Croitoru, Mihai, MD  atorvastatin  (LIPITOR) 10 MG tablet Take 1 tablet (10 mg total) by mouth daily. 01/25/24  Yes Maryclare Smoke A, PA  blood glucose meter kit and supplies KIT Dispense based on patient and insurance preference. 10/20/20  Yes Abonza, Maritza, PA-C  Continuous Glucose Receiver (FREESTYLE LIBRE 3 READER) DEVI 1 each by Does not apply route daily. 12/17/23  Yes Maryclare Smoke A, PA  Continuous Glucose Receiver (FREESTYLE LIBRE READER) DEVI 1 Device by Does not apply route daily. 12/11/23  Yes Noreene Bearded, PA  Continuous Glucose Sensor (FREESTYLE LIBRE 14 DAY SENSOR) MISC See admin instructions 01/25/24  Yes Noreene Bearded,  PA  Continuous Glucose Sensor (FREESTYLE LIBRE 3 SENSOR) MISC 1 each by Does not apply route daily. Place 1 sensor on the skin every 14 days. Use to check glucose continuously 04/22/24  Yes Laneta Pintos, MD  furosemide  (LASIX ) 20 MG tablet Take 1 tablet (20 mg total) by mouth as directed. Take 20 mg on  Mon., Wens., Friday. Take 40 mg Tues.,Thurs.,Sat., On Sunday Take 20 mg. 06/19/23  Yes Tania Familia, NP  glipiZIDE  (GLUCOTROL ) 10 MG tablet Take 1 tablet (10 mg total) by mouth 2 (two) times daily before a meal. 01/25/24  Yes Maryclare Smoke A, PA  glucose blood (FREESTYLE LITE) test strip Use to check blood sugars every morning fasting 04/02/18  Yes Danford, Katy D, NP  lisinopril  (ZESTRIL ) 5 MG tablet Take 1 tablet by mouth once daily 03/10/24  Yes Olson, Daniel K, MD  Multiple Vitamin (MULTIVITAMIN WITH MINERALS) TABS tablet Take 1 tablet by mouth daily. Men's One-A-Day Multivitamin   Yes [provider]  pregabalin  (LYRICA ) 100 MG capsule Take 1 capsule (100 mg total) by mouth 2 (two) times daily. 01/25/24  Yes Maryclare Smoke A, PA  promethazine -dextromethorphan  (PROMETHAZINE -DM) 6.25-15 MG/5ML syrup Take 5 mLs by mouth 4 (four) times daily as needed for cough. 09/29/23  Yes Vernestine Gondola, PA-C  Semaglutide  (RYBELSUS ) 7 MG TABS Take 7 mg by mouth daily.   Yes Maryclare Smoke A, PA  tamsulosin  (FLOMAX ) 0.4 MG CAPS capsule Take 1 capsule by mouth once daily 03/10/24  Yes Laneta Pintos, MD    Blood pressure 130/75, pulse 76, height 5' 10.5" (1.791 m), weight 235 lb (106.6 kg), SpO2 92%. Exam: General: Communicates without difficulty, well nourished, no acute distress. Head: Normocephalic, no evidence injury, no tenderness, facial buttresses intact without stepoff. Face/sinus: No tenderness to palpation and percussion. Facial movement is normal and symmetric. Eyes: PERRL, EOMI. No scleral icterus, conjunctivae clear. Neuro: CN II exam reveals vision grossly intact.  No nystagmus at any point of gaze. Ears: Auricles well formed without lesions.  Left ear cerumen impaction.  The right ear canal and tympanic membrane are normal.  Nose: External evaluation reveals normal support and skin without lesions.  Dorsum is intact.  Anterior rhinoscopy reveals congested mucosa over anterior aspect of inferior turbinates and intact septum.  No purulence noted. Oral:  Oral cavity and oropharynx are intact, symmetric, without erythema or edema.  Mucosa is moist without lesions. Neck: Full range of motion without pain.  There is no significant lymphadenopathy.  No masses palpable.  Thyroid  bed within normal limits to palpation.  Parotid glands and submandibular glands equal bilaterally without mass.  Trachea is midline. Neuro:  CN 2-12 grossly intact.   Procedure: Left ear cerumen disimpaction Anesthesia: None Description: Under the operating microscope, the cerumen is carefully removed with a combination of cerumen currette, alligator forceps, and suction catheters.  After the cerumen is removed, the TMs are noted to be normal.  No mass, erythema, or lesions. The patient  tolerated the procedure well.    Assessment: 1.  Left ear cerumen impaction.  After the cerumen disimpaction procedure, both tympanic membranes and middle ear spaces are noted to be normal. 2.  Referred left otalgia.  No acute infection is noted today.  Plan: 1.  Otomicroscopy with left ear cerumen disimpaction. 2.  The physical exam findings are reviewed with the patient.  He is reassured that no infection or otologic abnormality is noted. 3.  NSAIDs as needed to treat his referred  left otalgia. 4.  The patient will return for reevaluation in 2 weeks if he continues to be symptomatic.  Sabreen Kitchen W Leonela Kivi 04/24/2024, 10:10 AM

## 2024-04-28 ENCOUNTER — Encounter: Payer: Self-pay | Admitting: Family Medicine

## 2024-04-30 ENCOUNTER — Ambulatory Visit: Payer: PPO | Admitting: Family Medicine

## 2024-05-02 ENCOUNTER — Institutional Professional Consult (permissible substitution) (INDEPENDENT_AMBULATORY_CARE_PROVIDER_SITE_OTHER)

## 2024-05-02 ENCOUNTER — Ambulatory Visit (INDEPENDENT_AMBULATORY_CARE_PROVIDER_SITE_OTHER): Admitting: Audiology

## 2024-05-23 ENCOUNTER — Telehealth: Payer: Self-pay | Admitting: *Deleted

## 2024-05-23 NOTE — Telephone Encounter (Signed)
 Copied from CRM [id number]. Topic: Clinical - Prescription Issue >> May 22, 2024 12:56 PM Carlatta H wrote: Reason for CRM: [facility] pharmacy would like a call back regarding the pregabalin  (LYRICA ) 100 MG capsule [[id number]] prescription

## 2024-05-23 NOTE — Telephone Encounter (Signed)
 Contacted pharmacy and they stated that they had a request for gabapentin  as well.  Upon looking in chart it was discontinued in January and pt was put on pregabalin .  Pharmacy discontinued the gabapentin  Rx.

## 2024-05-26 ENCOUNTER — Encounter: Payer: Self-pay | Admitting: Family Medicine

## 2024-05-26 ENCOUNTER — Telehealth: Payer: Self-pay

## 2024-05-26 ENCOUNTER — Ambulatory Visit (INDEPENDENT_AMBULATORY_CARE_PROVIDER_SITE_OTHER): Admitting: Family Medicine

## 2024-05-26 VITALS — BP 135/78 | HR 77 | Ht 70.5 in | Wt 242.1 lb

## 2024-05-26 DIAGNOSIS — I480 Paroxysmal atrial fibrillation: Secondary | ICD-10-CM | POA: Diagnosis not present

## 2024-05-26 DIAGNOSIS — E1169 Type 2 diabetes mellitus with other specified complication: Secondary | ICD-10-CM

## 2024-05-26 DIAGNOSIS — H9202 Otalgia, left ear: Secondary | ICD-10-CM

## 2024-05-26 DIAGNOSIS — L0292 Furuncle, unspecified: Secondary | ICD-10-CM | POA: Diagnosis not present

## 2024-05-26 DIAGNOSIS — R911 Solitary pulmonary nodule: Secondary | ICD-10-CM | POA: Diagnosis not present

## 2024-05-26 DIAGNOSIS — E669 Obesity, unspecified: Secondary | ICD-10-CM | POA: Diagnosis not present

## 2024-05-26 DIAGNOSIS — Z7984 Long term (current) use of oral hypoglycemic drugs: Secondary | ICD-10-CM

## 2024-05-26 MED ORDER — FREESTYLE LIBRE 3 SENSOR MISC: 1.0000 | Freq: Every day | 3 refills | Status: DC

## 2024-05-26 MED ORDER — EMPAGLIFLOZIN 10 MG PO TABS: 10.0000 mg | ORAL_TABLET | Freq: Every day | ORAL | 2 refills | Status: DC

## 2024-05-26 MED ORDER — RYBELSUS 14 MG PO TABS: 14.0000 mg | ORAL_TABLET | Freq: Every day | ORAL | 2 refills | Status: DC

## 2024-05-26 MED ORDER — APIXABAN 5 MG PO TABS: 5.0000 mg | ORAL_TABLET | Freq: Two times a day (BID) | ORAL | 2 refills | Status: AC

## 2024-05-26 MED ORDER — MUPIROCIN 2 % EX OINT: 1.0000 | TOPICAL_OINTMENT | Freq: Two times a day (BID) | CUTANEOUS | 1 refills | Status: AC

## 2024-05-26 NOTE — Progress Notes (Signed)
 Established Patient Office Visit  Subjective   Patient ID: [name], male    DOB: 17-Jan-1951  Age: 73 y.o. MRN: [id number]  Chief Complaint  Patient presents with   Medical Management of Chronic Issues    HPI  Subjective - Ear: Reports intermittent dizziness and feeling like hearing is muffled. Had ears cleaned out recently. Exam showed normal eardrums. - Diabetes: Unable to tolerate metformin . Reports blood sugar runs high. Feels unable to move when glucose >100. 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 Checks glucose frequently with finger stick. Takes glipizide  BID. Reports lower blood sugar when eating dinner around 5pm versus 6:30-7pm. - Skin: groin lesion with head that broke during morning shower. Applied healing salve. lesion aggravated by leaning against car while working.  Medications: glipizide  BID, Lisinopril  daily, Eliquis  daily (reports being out for couple weeks), Rybelsus  BID (reports using free samples, last refill approximately 3-4 months ago)  PMH: Diabetes, Atrial fibrillation, Pulmonary nodule (2023), History of smoking (quit couple years ago)  ROS: Negative for ear pain, drainage, or hearing loss.    The ASCVD Risk score (Arnett DK, et al., 2019) failed to calculate for the following reasons:   Risk score cannot be calculated because patient has a medical history suggesting prior/existing ASCVD  Health Maintenance Due  Topic Date Due   Lung Cancer Screening  08/12/2023   COVID-19 Vaccine (4 - 2024-25 season) 08/26/2023   FOOT EXAM  04/17/2024   Medicare Annual Wellness (AWV)  05/28/2024      Objective:     BP 135/78   Pulse 77   Ht 5' 10.5" (1.791 m)   Wt 242 lb 1.3 oz (109.8 kg)   SpO2 94%   BMI 34.24 kg/m    Physical Exam Gen: alert, oriented Heent: perrla, eomi Cv: rrr Pulm: lctab,  Gi: soft, nontender.  Msk: strength equal b/l.   Ext: no pedal edema.     No results found for any  visits on 05/26/24.      Assessment & Plan:   Paroxysmal atrial fibrillation (HCC) Assessment & Plan: - On Eliquis , reports being out for couple weeks - Refill sent - Counseled on importance of consistent use   Type 2 diabetes mellitus with obesity (HCC) Assessment & Plan: - Currently on glipizide  BID, experiencing hypoglycemic episodes - Last Rybelsus  prescription approximately 1 year ago, patient using samples - Previously tried Invokana  (2022) and Farxiga  - Send prescription for Jardiance daily - Send prescription for Rybelsus  14mg  daily (instead of current BID dosing) - Continue glipizide  - Referral to [facility] for medication assistance programs - Attempted to set up LibreView glucose monitoring app on phone but encountered existing account issue.  Current sensor is linked to another device, advised him to link it to his phone next time he replaces it.  - Return in 3 months  Orders: -     FreeStyle Libre 3 Sensor; 1 each by Does not apply route daily. Place 1 sensor on the skin every 14 days. Use to check glucose continuously  Dispense: 4 each; Refill: 3 -     AMB Referral [facility] Care Management  Lung nodule Assessment & Plan: - Due for lung cancer screening - Previous nodule in 2023 with subsequent PET scan that recommended 6 month f/u.  - Ordering  CT chest   Furuncle Assessment & Plan: - Recommend warm compresses to facilitate drainage - Prescribed topical antibiotic ointment BID - Counseled on proper wound care  Otalgia of left ear Assessment & Plan: - Symptoms of ear fullness despite normal ear exam.  - has seen ENT and no f/u recommended.  - Recommended trial of Flonase nasal spray to reduce inflammation - Counseled on mechanism of eustachian tube dysfunction   Other orders -     Apixaban ; Take 1 tablet (5 mg total) by mouth 2 (two) times daily.  Dispense: 60 tablet; Refill: 2 -     Empagliflozin; Take 1 tablet (10 mg total) by mouth daily before breakfast.   Dispense: 30 tablet; Refill: 2 -     Rybelsus ; Take 1 tablet (14 mg total) by mouth daily.  Dispense: 30 tablet; Refill: 2 -     Mupirocin ; Apply 1 Application topically 2 (two) times daily.  Dispense: 22 g; Refill: 1     Return in about 3 months (around 08/26/2024) for DM.    Laneta Pintos, MD

## 2024-05-26 NOTE — Patient Instructions (Addendum)
 It was nice to see you today,  We addressed the following topics today: For your diabetes I want you to do the following: 1.  I am sending in your Rybelsus  as 14 mg.  Only take this once a day 2.  I am sending in [place].  When you get this you will take it once a day 3.  Continue taking your glipizide . 4.  You have a libre account.  In order to utilize the app on your phone you will need to find your old password and sign in using your old password or make a new password on the libre 3 app I downloaded for you 5.  I have sent in a referral to [facility] [employer].  Someone will call you to talk to you on the phone about getting your medications covered through patient assistance programs  I have sent in a ointment for you to put on the spot on your groin.  I would also like you to take a hot washcloth, wring it out and then apply it to that area multiple times a day to help with drainage.  If it appears like it is getting worse (more inflamed, more red, more painful or swollen) let us  know so we can treat it.  I have sent in a refill of your Eliquis .  You should take this medicine twice a day.  Have a great day,  Etha Henle, MD

## 2024-05-26 NOTE — Assessment & Plan Note (Signed)
-   On Eliquis , reports being out for couple weeks - Refill sent - Counseled on importance of consistent use

## 2024-05-26 NOTE — Assessment & Plan Note (Signed)
-   Currently on glipizide  BID, experiencing hypoglycemic episodes - Last Rybelsus  prescription approximately 1 year ago, patient using samples - Previously tried Invokana  (2022) and Farxiga  - Send prescription for Jardiance daily - Send prescription for Rybelsus  14mg  daily (instead of current BID dosing) - Continue glipizide  - Referral to [employer] for medication assistance programs - Attempted to set up LibreView glucose monitoring app on phone but encountered existing account issue.  Current sensor is linked to another device, advised him to link it to his phone next time he replaces it.  - Return in 3 months

## 2024-05-26 NOTE — Assessment & Plan Note (Signed)
-   Recommend warm compresses to facilitate drainage - Prescribed topical antibiotic ointment BID - Counseled on proper wound care

## 2024-05-26 NOTE — Assessment & Plan Note (Signed)
-   Due for lung cancer screening - Previous nodule in 2023 with subsequent PET scan that recommended 6 month f/u.  - Ordering  CT chest

## 2024-05-26 NOTE — Assessment & Plan Note (Signed)
-   Symptoms of ear fullness despite normal ear exam.  - has seen ENT and no f/u recommended.  - Recommended trial of Flonase nasal spray to reduce inflammation - Counseled on mechanism of eustachian tube dysfunction

## 2024-05-26 NOTE — Progress Notes (Signed)
 Care Guide Pharmacy Note  05/26/2024 Name: [name] MRN: [id number] DOB: Aug 10, 1951  Referred By: Laneta Pintos, MD Reason for referral: Complex Care Management (Outreach to schedule with Pharm d )   [name] is a 73 y.o. year old male who is a primary care patient of Laneta Pintos, MD.  [name] was referred to the pharmacist for assistance related to: DMII  Successful contact was made with the patient to discuss pharmacy services including being ready for the pharmacist to call at least 5 minutes before the scheduled appointment time and to have medication bottles and any blood pressure readings ready for review. The patient agreed to meet with the pharmacist via telephone visit on (date/time).06/02/2024  [name] , RMA     [place]  [facility], [facility] Guide  Direct Dial: [phone]  Website: [email or web address]

## 2024-05-29 ENCOUNTER — Ambulatory Visit

## 2024-05-29 DIAGNOSIS — Z0000 Encounter for general adult medical examination without abnormal findings: Secondary | ICD-10-CM

## 2024-05-29 NOTE — Progress Notes (Signed)
 Subjective:   [name] is a 73 y.o. who presents for a Medicare Wellness preventive visit.  As a reminder, Annual Wellness Visits don't include a physical exam, and some assessments may be limited, especially if this visit is performed virtually. We may recommend an in-person follow-up visit with your provider if needed.  Visit Complete: Virtual I connected with  [name] on 05/29/24 by a audio enabled telemedicine application and verified that I am speaking with the correct person using two identifiers.  Patient Location: Home  Provider Location: Home Office  I discussed the limitations of evaluation and management by telemedicine. The patient expressed understanding and agreed to proceed.  Vital Signs: Because this visit was a virtual/telehealth visit, some criteria may be missing or patient reported. Any vitals not documented were not able to be obtained and vitals that have been documented are patient reported.  VideoError- Librarian, academic were attempted between this provider and patient, however failed, due to patient having technical difficulties OR patient did not have access to video capability.  We continued and completed visit with audio only.   Persons Participating in Visit: Patient.  AWV Questionnaire: No: Patient Medicare AWV questionnaire was not completed prior to this visit.  Cardiac Risk Factors include: advanced age (>63men, >46 women);diabetes mellitus;dyslipidemia;hypertension;male gender     Objective:     Today's Vitals   There is no height or weight on file to calculate BMI.     05/29/2024    3:03 PM 12/15/2023    5:17 PM 05/29/2023    9:10 AM 11/27/2022    5:42 AM 11/14/2022    9:07 AM 03/27/2021    8:17 AM 04/25/2019   10:47 PM  Advanced Directives  Does Patient Have a Medical Advance Directive? No No No No No No No  Does patient want to make changes to medical advance directive?       No - Patient declined   Would patient like information on creating a medical advance directive?  No - Patient declined No - Patient declined No - Patient declined   No - Patient declined    Current Medications (verified) Outpatient Encounter Medications as of 05/29/2024  Medication Sig   apixaban  (ELIQUIS ) 5 MG TABS tablet Take 1 tablet (5 mg total) by mouth 2 (two) times daily.   atorvastatin  (LIPITOR) 10 MG tablet Take 1 tablet (10 mg total) by mouth daily.   blood glucose meter kit and supplies KIT Dispense based on patient and insurance preference.   Continuous Glucose Receiver (FREESTYLE LIBRE 3 READER) DEVI 1 each by Does not apply route daily.   Continuous Glucose Receiver (FREESTYLE LIBRE READER) DEVI 1 Device by Does not apply route daily.   Continuous Glucose Sensor (FREESTYLE LIBRE 14 DAY SENSOR) MISC See admin instructions   Continuous Glucose Sensor (FREESTYLE LIBRE 3 SENSOR) MISC 1 each by Does not apply route daily. Place 1 sensor on the skin every 14 days. Use to check glucose continuously   empagliflozin (JARDIANCE) 10 MG TABS tablet Take 1 tablet (10 mg total) by mouth daily before breakfast.   furosemide  (LASIX ) 20 MG tablet Take 1 tablet (20 mg total) by mouth as directed. Take 20 mg on  Mon., Wens., Friday. Take 40 mg Tues.,Thurs.,Sat., On Sunday Take 20 mg.   glipiZIDE  (GLUCOTROL ) 10 MG tablet Take 1 tablet (10 mg total) by mouth 2 (two) times daily before a meal.   glucose blood (FREESTYLE LITE) test strip Use to check  blood sugars every morning fasting   mupirocin  ointment (BACTROBAN ) 2 % Apply 1 Application topically 2 (two) times daily.   pregabalin  (LYRICA ) 100 MG capsule Take 1 capsule (100 mg total) by mouth 2 (two) times daily.   Semaglutide  (RYBELSUS ) 14 MG TABS Take 1 tablet (14 mg total) by mouth daily.   tamsulosin  (FLOMAX ) 0.4 MG CAPS capsule Take 1 capsule by mouth once daily   lisinopril  (ZESTRIL ) 5 MG tablet Take 1 tablet by mouth once daily (Patient not taking: Reported on  05/29/2024)   Multiple Vitamin (MULTIVITAMIN WITH MINERALS) TABS tablet Take 1 tablet by mouth daily. Men's One-A-Day Multivitamin (Patient not taking: Reported on 05/29/2024)   No facility-administered encounter medications on file as of 05/29/2024.    Allergies (verified) Bee venom and Penicillins   History: Past Medical History:  Diagnosis Date   Abdominal aortic aneurysm (AAA) (HCC)    3 cm per 07-02-18 us  abdominal aorta us  epic   Arthritis    Blood clot in vein    behind left knee   Cerebral infarction (HCC) 08/11/2016   Chronic venous insufficiency LOWER EXTREMITIES   Coronary artery disease CARDIOLOGIST - DR  JYNWGNFA-  LAST 1 WK AGO -- WILL REQUEST NOTE AND STRESS TEST   Diabetes mellitus without complication (HCC)    type 2   Hematuria    last year   History of kidney stones    Hyperglycemia    Hyperlipidemia    Hypertension    Malignant neoplasm of prostate (HCC) 08/18/2011   gleason 8 Formatting of this note might be different from the original. Formatting of this note might be different from the original. gleason 8     Mixed dyslipidemia    Myocardial infarction (HCC)    Neuropathy    toes only   Nocturia    Paraseptal emphysema (HCC) 03/19/2018   Prostate cancer (HCC) 08/18/2011   gleason 8, volume 24.4cc   S/P CABG x 4    ST elevation MI (STEMI) (HCC) 02/10/2008   S/P CABG   Stroke (HCC) 2016   occ trouble wriing with right hand   Urinary hesitancy    Vision abnormalities    resolved now   Wound discharge    since jan 2019 goes to wound center [facility] left great toe small open area changes dressing q 2 days with ointment provided by wound center, clear drainage occ   Past Surgical History:  Procedure Laterality Date   CARDIAC CATHETERIZATION  05/01/2012   grafts widely patent   CARDIOVASCULAR STRESS TEST  03-15-2010   INFERIOR WALL SCAR WITHOUT ANY MEANINGFUL ISCHEMIA/ EF 46% / LOW RISK SCAN   CATARACT EXTRACTION     CORONARY ARTERY BYPASS GRAFT   02-10-2008  DR Berry [place]   X4 VESSEL DISEASE / EMERGANT CABG   CYSTOSCOPY  02/08/2012   Procedure: CYSTOSCOPY FLEXIBLE;  Surgeon: Trent Frizzle, MD;  Location: [facility];  Service: Urology;;   CYSTOSCOPY WITH RETROGRADE PYELOGRAM, URETEROSCOPY AND STENT PLACEMENT Right 09/12/2018   Procedure: CYSTOSCOPY WITH RETROGRADE PYELOGRAM, URETEROSCOPY AND STENT PLACEMENT, STONE EXTRACTION;  Surgeon: Trent Frizzle, MD;  Location: [facility];  Service: Urology;  Laterality: Right;   EP IMPLANTABLE DEVICE N/A 08/14/2016   Procedure: Loop Recorder Insertion;  Surgeon: Tammie Fall, MD;  Location: [facility];  Service: Cardiovascular;  Laterality: N/A;   EXTRACORPOREAL SHOCK WAVE LITHOTRIPSY  2013   HOLMIUM LASER APPLICATION Right 09/12/2018   Procedure: HOLMIUM LASER APPLICATION;  Surgeon: Trent Frizzle, MD;  Location: [facility];  Service: Urology;  Laterality: Right;   KNEE SURGERY  age 53   RIGHT   LEFT HEART CATHETERIZATION WITH CORONARY/GRAFT ANGIOGRAM N/A 05/01/2012   Procedure: LEFT HEART CATHETERIZATION WITH Estella Helling;  Surgeon: Luana Rumple, MD;  Location: [facility];  Service: Cardiovascular;  Laterality: N/A;   MULTIPLE TEETH EXTRACTIONS (23)/ FOUR QUADRANT ALVEOLPLASTY/ MANDIBULAR LATERAL EXOSTOSES REDUCTIONS  03-24-2010   CHRONIC PERIODONTITIS   RADIOACTIVE SEED IMPLANT  02/08/2012   Procedure: RADIOACTIVE SEED IMPLANT;  Surgeon: Trent Frizzle, MD;  Location: [facility];  Service: Urology;  Laterality: N/A;  C-ARM    SHOULDER SURGERY  1982   LEFT   TEE WITHOUT CARDIOVERSION N/A 08/14/2016   Procedure: TRANSESOPHAGEAL ECHOCARDIOGRAM (TEE);  Surgeon: Loyde Rule, MD;  Location[facility];  Service: Cardiovascular;  Laterality: N/A;   TOTAL KNEE ARTHROPLASTY Right 11/27/2022   Procedure: TOTAL KNEE ARTHROPLASTY;  Surgeon: Christie Cox, MD;  Location: [facility];  Service: Orthopedics;  Laterality: Right;   URETERAL STENT PLACEMENT      Family History  Problem Relation Age of Onset   Cancer Father        pancreatic   Diabetes Father    Social History   Socioeconomic History   Marital status: Married    Spouse name: Not on file   Number of children: Not on file   Years of education: Not on file   Highest education level: Not on file  Occupational History   Not on file  Tobacco Use   Smoking status: Former    Current packs/day: 0.50    Average packs/day: 0.5 packs/day for 50.0 years (25.0 ttl pk-yrs)    Types: Cigarettes    Passive exposure: Never   Smokeless tobacco: Never   Tobacco comments:    PREVIOUSLY SMOKED 3PPD / DECREASED TO 1PPD SINCE 2009  Vaping Use   Vaping status: Never Used  Substance and Sexual Activity   Alcohol use: Not Currently    Alcohol/week: 2.0 standard drinks of alcohol    Types: 1 Cans of beer, 1 Shots of liquor per week   Drug use: No   Sexual activity: Not Currently  Other Topics Concern   Not on file  Social History Narrative   Not on file   Social Drivers of Health   Financial Resource Strain: Low Risk  (05/29/2024)   Overall Financial Resource Strain (CARDIA)    Difficulty of Paying Living Expenses: Not hard at all  Food Insecurity: No Food Insecurity (05/29/2024)   Hunger Vital Sign    Worried About Running Out of Food in the Last Year: Never true    Ran Out of Food in the Last Year: Never true  Transportation Needs: No Transportation Needs (05/29/2024)   PRAPARE - Administrator, Civil Service (Medical): No    Lack of Transportation (Non-Medical): No  Physical Activity: Inactive (05/29/2024)   Exercise Vital Sign    Days of Exercise per Week: 0 days    Minutes of Exercise per Session: 0 min  Stress: No Stress Concern Present (05/29/2024)   [employer] of Occupational Health - Occupational Stress Questionnaire    Feeling of Stress : Not at all  Social Connections: Moderately Isolated (05/29/2024)   Social Connection and Isolation Panel [NHANES]     Frequency of Communication with Friends and Family: More than three times a week    Frequency of Social Gatherings with Friends and Family: Once a week    Attends  Religious Services: Never    Active Member of Clubs or Organizations: No    Attends Banker Meetings: Never    Marital Status: Married    Tobacco Counseling Counseling given: Not Answered Tobacco comments: PREVIOUSLY SMOKED 3PPD / DECREASED TO 1PPD SINCE 2009    Clinical Intake:  Pre-visit preparation completed: Yes  Pain : No/denies pain     Nutritional Risks: None Diabetes: Yes CBG done?: No Did pt. bring in CBG monitor from home?: No  Lab Results  Component Value Date   HGBA1C 9.1 (H) 04/22/2024   HGBA1C 11.0 (A) 01/25/2024   HGBA1C 10.1 (H) 10/17/2023     How often do you need to have someone help you when you read instructions, pamphlets, or other written materials from your doctor or pharmacy?: 1 - Never  Interpreter Needed?: No  Information entered by :: NAllen LPN   Activities of Daily Living     05/29/2024    2:57 PM  In your present state of health, do you have any difficulty performing the following activities:  Hearing? 0  Vision? 0  Difficulty concentrating or making decisions? 0  Walking or climbing stairs? 1  Dressing or bathing? 0  Doing errands, shopping? 0  Preparing Food and eating ? N  Using the Toilet? N  In the past six months, have you accidently leaked urine? Y  Comment getting worse  Do you have problems with loss of bowel control? N  Managing your Medications? N  Managing your Finances? N  Housekeeping or managing your Housekeeping? N    Patient Care Team: Laneta Pintos, MD as PCP - General (Family Medicine) Luana Rumple, MD as PCP - Cardiology (Cardiology) Liane Redman, MD as Consulting Physician (Infectious Diseases) Currence, Cordie Deters, PA-C (Orthopedic Surgery)  I have updated your Care Teams any recent Medical Services you may have received  from other providers in the past year.     Assessment:    This is a routine wellness examination for [name].  Hearing/Vision screen Hearing Screening - Comments:: Denies hearing issues Vision Screening - Comments:: Regular eye exams, [facility]   Goals Addressed             This Visit's Progress    Patient Stated       05/29/2024, wants to lose weight       Depression Screen     05/29/2024    3:04 PM 05/26/2024    8:49 AM 01/25/2024    8:25 AM 07/18/2023    9:03 AM 05/29/2023    9:08 AM 03/07/2023   10:11 AM 01/23/2023    8:44 AM  PHQ 2/9 Scores  PHQ - 2 Score 0 0 0 0 0 0 1  PHQ- 9 Score 0 0 0 0   3    Fall Risk     05/29/2024    3:04 PM 04/22/2024    1:22 PM 01/25/2024    8:25 AM 07/18/2023    9:02 AM 05/29/2023    9:10 AM  Fall Risk   Falls in the past year? 0 0 0 0 0  Number falls in past yr: 0 0 0 0 0  Injury with Fall? 0 0 0 0 0  Risk for fall due to : Medication side effect No Fall Risks No Fall Risks No Fall Risks No Fall Risks  Follow up Falls evaluation completed;Falls prevention discussed Falls evaluation completed Falls evaluation completed Falls evaluation completed Falls prevention discussed    MEDICARE RISK  AT HOME:  Medicare Risk at Home Any stairs in or around the home?: Yes If so, are there any without handrails?: No Home free of loose throw rugs in walkways, pet beds, electrical cords, etc?: Yes Adequate lighting in your home to reduce risk of falls?: Yes Life alert?: No Use of a cane, walker or w/c?: No Grab bars in the bathroom?: Yes Shower chair or bench in shower?: No Elevated toilet seat or a handicapped toilet?: No  TIMED UP AND GO:  Was the test performed?  No  Cognitive Function: 6CIT completed        05/29/2024    3:05 PM 05/29/2023    9:10 AM 03/21/2022    8:48 AM 06/20/2018    8:26 AM  6CIT Screen  What Year? 0 points 0 points 0 points 0 points  What month? 0 points 0 points 0 points 0 points  What time? 0 points 0 points 0  points 0 points  Count back from 20 0 points 0 points 0 points 0 points  Months in reverse 2 points 0 points 0 points 0 points  Repeat phrase 0 points 0 points 6 points 2 points  Total Score 2 points 0 points 6 points 2 points    Immunizations Immunization History  Administered Date(s) Administered   Fluad Quad(high Dose 65+) 10/20/2020, 11/16/2021   Fluad Trivalent(High Dose 65+) 01/25/2024   Influenza, High Dose Seasonal PF 11/27/2017, 01/21/2019   PFIZER(Purple Top)SARS-COV-2 Vaccination 01/14/2020, 02/02/2020, 11/16/2020   PNEUMOCOCCAL CONJUGATE-20 06/22/2023   Pneumococcal Conjugate-13 11/27/2017   RSV,unspecified 08/08/2022   Tdap 01/07/2018   Zoster Recombinant(Shingrix) 08/08/2022, 06/22/2023    Screening Tests Health Maintenance  Topic Date Due   Lung Cancer Screening  08/12/2023   COVID-19 Vaccine (4 - 2024-25 season) 08/26/2023   FOOT EXAM  04/17/2024   INFLUENZA VACCINE  07/25/2024   OPHTHALMOLOGY EXAM  08/20/2024   HEMOGLOBIN A1C  10/22/2024   Diabetic kidney evaluation - Urine ACR  01/24/2025   Diabetic kidney evaluation - eGFR measurement  04/22/2025   Medicare Annual Wellness (AWV)  05/29/2025   DTaP/Tdap/Td (2 - Td or Tdap) 01/08/2028   Colonoscopy  03/11/2029   Pneumonia Vaccine 22+ Years old  Completed   Hepatitis C Screening  Completed   Zoster Vaccines- Shingrix  Completed   HPV VACCINES  Aged Out   Meningococcal B Vaccine  Aged Out    Health Maintenance  Health Maintenance Due  Topic Date Due   Lung Cancer Screening  08/12/2023   COVID-19 Vaccine (4 - 2024-25 season) 08/26/2023   FOOT EXAM  04/17/2024   Health Maintenance Items Addressed: Due for covid vaccine. PCP ordered lung screening. Patient states will follow up with podiatrist.  Additional Screening:  Vision Screening: Recommended annual ophthalmology exams for early detection of glaucoma and other disorders of the eye. Would you like a referral to an eye doctor? No    Dental  Screening: Recommended annual dental exams for proper oral hygiene  Community Resource Referral / Chronic Care Management: CRR required this visit?  No   CCM required this visit?  No   Plan:    I have personally reviewed and noted the following in the patient's chart:   Medical and social history Use of alcohol, tobacco or illicit drugs  Current medications and supplements including opioid prescriptions. Patient is not currently taking opioid prescriptions. Functional ability and status Nutritional status Physical activity Advanced directives List of other physicians Hospitalizations, surgeries, and ER visits in previous 12  months Vitals Screenings to include cognitive, depression, and falls Referrals and appointments  In addition, I have reviewed and discussed with patient certain preventive protocols, quality metrics, and best practice recommendations. A written personalized care plan for preventive services as well as general preventive health recommendations were provided to patient.   Areatha Beecham, LPN   01/28/4009   After Visit Summary: (MyChart) Due to this being a telephonic visit, the after visit summary with patients personalized plan was offered to patient via MyChart   Notes: Nothing significant to report at this time.

## 2024-05-29 NOTE — Patient Instructions (Signed)
 [name] , Thank you for taking time out of your busy schedule to complete your Annual Wellness Visit with me. I enjoyed our conversation and look forward to speaking with you again next year. I, as well as your care team,  appreciate your ongoing commitment to your health goals. Please review the following plan we discussed and let me know if I can assist you in the future. Your Game plan/ To Do List    Referrals: If you haven't heard from the office you've been referred to, please reach out to them at the phone provided.  N/a Follow up Visits: Next Medicare AWV with our clinical staff: 07/02/2025 at 3:00   Have you seen your provider in the last 6 months (3 months if uncontrolled diabetes)? Yes Next Office Visit with your provider: 09/02/2024 at 8:10  Clinician Recommendations:  Aim for 30 minutes of exercise or brisk walking, 6-8 glasses of water , and 5 servings of fruits and vegetables each day.       This is a list of the screening recommended for you and due dates:  Health Maintenance  Topic Date Due   Screening for Lung Cancer  08/12/2023   COVID-19 Vaccine (4 - 2024-25 season) 08/26/2023   Complete foot exam   04/17/2024   Flu Shot  07/25/2024   Eye exam for diabetics  08/20/2024   Hemoglobin A1C  10/22/2024   Yearly kidney health urinalysis for diabetes  01/24/2025   Yearly kidney function blood test for diabetes  04/22/2025   Medicare Annual Wellness Visit  05/29/2025   DTaP/Tdap/Td vaccine (2 - Td or Tdap) 01/08/2028   Colon Cancer Screening  03/11/2029   Pneumonia Vaccine  Completed   Hepatitis C Screening  Completed   Zoster (Shingles) Vaccine  Completed   HPV Vaccine  Aged Out   Meningitis B Vaccine  Aged Out    Advanced directives: (Declined) Advance directive discussed with you today. Even though you declined this today, please call our office should you change your mind, and we can give you the proper paperwork for you to fill out. Advance Care Planning is important  because it:  [x]  Makes sure you receive the medical care that is consistent with your values, goals, and preferences  [x]  It provides guidance to your family and loved ones and reduces their decisional burden about whether or not they are making the right decisions based on your wishes.  Follow the link provided in your after visit summary or read over the paperwork we have mailed to you to help you started getting your Advance Directives in place. If you need assistance in completing these, please reach out to us  so that we can help you!  See attachments for Preventive Care and Fall Prevention Tips.

## 2024-06-02 ENCOUNTER — Telehealth: Payer: Self-pay

## 2024-06-02 ENCOUNTER — Other Ambulatory Visit: Payer: Self-pay

## 2024-06-02 NOTE — Progress Notes (Signed)
   06/02/2024  Patient ID: [name], male   DOB: [birth year], 73 y.o.   MRN: [id number]  Attempted to contact patient for scheduled appointment for medication management. Left HIPAA compliant message for patient to return my call at their convenience.   Rolando Cliche, PharmD, BCGP Clinical Pharmacist  [phone]

## 2024-06-02 NOTE — Telephone Encounter (Signed)
 The patient called in returning a call from Rolando Cliche. I Teams message Derwood Flor and he had me transfer the patient.

## 2024-06-02 NOTE — Progress Notes (Signed)
 06/02/2024 Name: [name] MRN: [id number] DOB: 03-Jul-1951  No chief complaint on file.   [name] is a 73 y.o. year old male who presented for a telephone visit.   They were referred to the pharmacist by their PCP for assistance in managing diabetes and medication access.    Subjective:  Care Team: Primary Care Provider: Laneta Pintos, MD ; Next Scheduled Visit:  Future Appointments  Date Time Provider Department Center  09/02/2024  8:10 AM Laneta Pintos, MD PCFO-PCFO None  03/24/2025  9:15 AM BUA-LAB B[facility]-BUA None  03/31/2025  9:40 AM Kathreen Pare, PA-C BUA-BUA None  07/02/2025  3:00 PM PCFO-ANNUAL WELLNESS VISIT PCFO-PCFO None    Medication Access/Adherence  Current Pharmacy:  [facility] [address] ([address]), [place] - [address]  ([address]) [place] [address] Phone: [phone] Fax: [phone]   Patient reports affordability concerns with their medications: Yes  Patient reports access/transportation concerns to their pharmacy: No  Patient reports adherence concerns with their medications:  Yes  - related to cost    Diabetes:  Current medications: Jardiance  10 mg, Glipizide  10mg  bid, rybelsus  7mg  Medications tried in the past: metformin  (GI side effects - diarrhea)  Current glucose readings: 150-250 Using Freestyle libre meter; testing continuously  Observed patterns:  Patient at times notes hypoglycemic s/sx including fatigue. Patient denies hyperglycemic symptoms including polyuria, polydipsia, polyphagia, nocturia, neuropathy, blurred vision.  Current meal patterns: coffee, egg mcmuffin. No snacking at night. Eats routine meals.   Current physical activity: yard work, Curator work.   Current medication access support: none at this time, previously qualified for rybelsus  pap.   Objective:  Lab Results  Component Value Date   HGBA1C 9.1 (H) 04/22/2024    Lab Results  Component Value Date    CREATININE 0.82 04/22/2024   BUN 13 04/22/2024   NA 138 04/22/2024   K 4.3 04/22/2024   CL 100 04/22/2024   CO2 21 04/22/2024    Lab Results  Component Value Date   CHOL 113 04/22/2024   HDL 32 (L) 04/22/2024   LDLCALC 66 04/22/2024   TRIG 73 04/22/2024   CHOLHDL 3.5 04/22/2024    Medications Reviewed Today     Reviewed by Rolando Cliche, [facility] (Pharmacist) on 06/02/24 at 0933  Med List Status: <None>   Medication Order Taking? Sig Documenting Provider Last Dose Status Informant  apixaban  (ELIQUIS ) 5 MG TABS tablet [id number] No Take 1 tablet (5 mg total) by mouth 2 (two) times daily. Laneta Pintos, MD Taking Active   atorvastatin  (LIPITOR) 10 MG tablet [id number] No Take 1 tablet (10 mg total) by mouth daily. Noreene Bearded, PA Taking Active   blood glucose meter kit and supplies KIT [id number] No Dispense based on patient and insurance preference. Abonza, Maritza, PA-C Taking Active Self  Continuous Glucose Receiver (FREESTYLE LIBRE 3 READER) DEVI [id number] No 1 each by Does not apply route daily. Noreene Bearded, PA Taking Active   Continuous Glucose Receiver (FREESTYLE LIBRE READER) DEVI [id number] No 1 Device by Does not apply route daily. Noreene Bearded, PA Taking Active   Continuous Glucose Sensor (FREESTYLE LIBRE [address] [place]) [place] [id number] No See admin instructions Noreene Bearded, PA Taking Active   Continuous Glucose Sensor (FREESTYLE LIBRE 3 SENSOR) [place] [id number] No 1 each by Does not apply route daily. Place 1 sensor on the skin every 14 days. Use to check glucose continuously Laneta Pintos, MD Taking Active  empagliflozin  (JARDIANCE ) 10 MG TABS tablet [id number] No Take 1 tablet (10 mg total) by mouth daily before breakfast. Laneta Pintos, MD Taking Active   furosemide  (LASIX ) 20 MG tablet [id number] No Take 1 tablet (20 mg total) by mouth as directed. Take 20 mg on  Mon., Wens., Friday. Take 40 mg Tues.,Thurs.,Sat., On Sunday Take 20 mg. Tania Familia, NP  Taking Active   glipiZIDE  (GLUCOTROL ) 10 MG tablet [id number] No Take 1 tablet (10 mg total) by mouth 2 (two) times daily before a meal. Noreene Bearded, PA Taking Active   glucose blood (FREESTYLE LITE) test strip [id number] No Use to check blood sugars every morning fasting Danford, Katy D, NP Taking Active Self  lisinopril  (ZESTRIL ) 5 MG tablet [id number] No Take 1 tablet by mouth once daily  Patient not taking: Reported on 05/29/2024   Laneta Pintos, MD Not Taking Active   Multiple Vitamin (MULTIVITAMIN WITH MINERALS) TABS tablet [id number] No Take 1 tablet by mouth daily. Men's One-A-Day Multivitamin  Patient not taking: Reported on 05/29/2024   [provider] Not Taking Active Self  mupirocin  ointment (BACTROBAN ) 2 % [id number] No Apply 1 Application topically 2 (two) times daily. Laneta Pintos, MD Taking Active   pregabalin  (LYRICA ) 100 MG capsule [id number] No Take 1 capsule (100 mg total) by mouth 2 (two) times daily. Noreene Bearded, PA Taking Active   Semaglutide  (RYBELSUS ) 14 MG TABS [id number] No Take 1 tablet (14 mg total) by mouth daily. 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  - Meets financial criteria for Jardiance /Rybelsus /Eliquis  patient assistance program through [employer]. Will collaborate with provider, CPhT, and patient to pursue assistance.    Follow Up Plan: CPhT to mail eliquis   patient assistance to patient  Rolando Cliche, PharmD, BCGP Clinical Pharmacist  [phone]

## 2024-06-04 ENCOUNTER — Telehealth: Payer: Self-pay

## 2024-06-04 NOTE — Telephone Encounter (Signed)
 PAP: Patient assistance application for Eliquis through [employer] (BMS) has been mailed to pt's home address on file. Provider portion of application will be faxed to provider's office.

## 2024-06-04 NOTE — Telephone Encounter (Signed)
 PAP: Patient assistance application for Jardiance through [employer]) has been mailed to pt's home address on file. Provider portion of application will be faxed to provider's office.

## 2024-06-04 NOTE — Telephone Encounter (Signed)
 PAP: Patient assistance application for Rybelsus  through [employer] has been mailed to pt's home address on file. Provider portion of application will be faxed to provider's office. Patient portion was e-filed.

## 2024-06-17 NOTE — Telephone Encounter (Signed)
 Reached out to patient regarding PAP application for Jardiance  (BI) left HIPAA compliant v/m to return call for any assistance.

## 2024-06-18 NOTE — Telephone Encounter (Signed)
 Spoke to Patient and he did receive Pap application for Eliquis  ([employer]MS) he is waiting on his Daughter's return because she is his Radiation protection practitioner for assistance.

## 2024-06-18 NOTE — Telephone Encounter (Signed)
 Patient returned call and said He is waiting on his daughter to return to assist with PAP Application for Jardiance  (BI) because she is his Radiation protection practitioner.

## 2024-06-25 NOTE — Telephone Encounter (Signed)
 PAP: Application for Rybelsus has been submitted to [employer], via fax

## 2024-07-07 NOTE — Telephone Encounter (Signed)
 Reached out to patient regarding PAP application for Eliquis  left HIPAA compliant v/m for any assistance or questions.

## 2024-07-08 ENCOUNTER — Other Ambulatory Visit: Payer: Self-pay

## 2024-07-08 ENCOUNTER — Encounter (HOSPITAL_BASED_OUTPATIENT_CLINIC_OR_DEPARTMENT_OTHER): Payer: Self-pay | Admitting: Emergency Medicine

## 2024-07-08 ENCOUNTER — Emergency Department (HOSPITAL_BASED_OUTPATIENT_CLINIC_OR_DEPARTMENT_OTHER)
Admission: EM | Admit: 2024-07-08 | Discharge: 2024-07-08 | Disposition: A | Discharge status: Home / Self Care | Attending: Emergency Medicine | Admitting: Emergency Medicine

## 2024-07-08 ENCOUNTER — Telehealth: Payer: Self-pay

## 2024-07-08 DIAGNOSIS — X18XXXA Contact with other hot metals, initial encounter: Secondary | ICD-10-CM | POA: Diagnosis not present

## 2024-07-08 DIAGNOSIS — S8992XA Unspecified injury of left lower leg, initial encounter: Secondary | ICD-10-CM | POA: Diagnosis present

## 2024-07-08 DIAGNOSIS — Z23 Encounter for immunization: Secondary | ICD-10-CM | POA: Diagnosis not present

## 2024-07-08 DIAGNOSIS — S81812A Laceration without foreign body, left lower leg, initial encounter: Secondary | ICD-10-CM | POA: Diagnosis not present

## 2024-07-08 DIAGNOSIS — Z7901 Long term (current) use of anticoagulants: Secondary | ICD-10-CM | POA: Insufficient documentation

## 2024-07-08 DIAGNOSIS — E119 Type 2 diabetes mellitus without complications: Secondary | ICD-10-CM | POA: Diagnosis not present

## 2024-07-08 MED ORDER — CEPHALEXIN 500 MG PO CAPS: 500.0000 mg | ORAL_CAPSULE | Freq: Four times a day (QID) | ORAL | 0 refills | Status: DC

## 2024-07-08 MED ORDER — TETANUS-DIPHTH-ACELL PERTUSSIS 5-2.5-18.5 LF-MCG/0.5 IM SUSY
0.5000 mL | PREFILLED_SYRINGE | Freq: Once | INTRAMUSCULAR | Status: AC
  Administered 2024-07-08: 0.5 mL via INTRAMUSCULAR
  Filled 2024-07-08: qty 0.5

## 2024-07-08 NOTE — Discharge Instructions (Addendum)
 You were seen in the Emergency Department today for evaluation of your laceration. I am glad we were able to repair this for you. Please make sure that you are remembering to keep your wound clean daily with Dial soap and water  and daily bandage changes. I recommend keeping the wound covered for the next 48-72 hours and then to your comfort afterwards. Do not expose the wound to any dishwater, pools, lakes, oceans, [employer], dirt or grime. Keeping the wound clean and away from contamination can help ensure good wound healing and help to prevent infections.  These will fall off on their own but you do need to follow-up with your primary care provider for wound monitoring.  For pain, I recommend Tylenol  as needed for pain.  Please make sure to take your antibiotic as prescribed and complete the entirety of the course. Let your PCP know that we updated your tetanus shot for you today. If you have any concerns, new or worsening symptoms, please return to the nearest ER for re-evaluation.   Contact a doctor if: You got a tetanus shot and you have any of these problems where the needle went in: Swelling. Very bad pain. Redness. Bleeding. A wound that was closed breaks open. You have a fever. You have any of these signs of infection in your wound: More redness, swelling, or pain. Fluid or blood. Warmth. Pus or a bad smell. You see something coming out of the wound, such as wood or glass. Medicine does not make your pain go away. You notice a change in the color of your skin near your wound. You need to change the bandage often. You have a new rash. You lose feeling (have numbness) around the wound. Get help right away if: You have very bad swelling around the wound. Your pain suddenly gets worse and is very bad. You have painful lumps near the wound or on skin anywhere on your body. You have a red streak going away from your wound. The wound is on your hand or foot, and: You cannot move a finger  or toe. Your fingers or toes look pale or bluish.

## 2024-07-08 NOTE — ED Notes (Signed)
 Left leg laceration skin tear dressed with Telfa and ace wrap.  Teaching on wound care given

## 2024-07-08 NOTE — Progress Notes (Signed)
   07/08/2024  Patient ID: [name] Limes, male   DOB: 1951/04/17, 73 y.o.   MRN: [id number]  Attempted to contact patient for scheduled appointment for medication management. Briefly spoke with patient, was at [facility] - gash in leg after hitting piece of steel on mower. Will try patient early next week. Wanting to f/u of on DM for blood sugar readings, and ensuring patient assistance complete. I see with confirmation that he has been approved for rybelsus  14mg .   Future Appointments  Date Time Provider Department Center  07/14/2024 10:00 AM Pandora Cadet, [facility] CHL-POPH None  09/02/2024  8:10 AM Chandra Toribio POUR, MD PCFO-PCFO None  03/24/2025  9:15 AM BUA-LAB BUA-BUA None  03/31/2025  9:40 AM Maurine Lukes, PA-C BUA-BUA None  07/02/2025  3:00 PM PCFO-ANNUAL WELLNESS VISIT PCFO-PCFO None   Cadet Pandora, PharmD, BCGP Clinical Pharmacist  [phone]

## 2024-07-08 NOTE — ED Provider Notes (Signed)
 [place] EMERGENCY DEPARTMENT AT [facility] Provider Note   CSN: [id number] Arrival date & time: 07/08/24  9079     Patient presents with: Laceration   [name] is a 73 y.o. male with history of diabetes presents to the emerged from today for evaluation of left lower leg skin tear.  Yesterday afternoon (1500), patient reports that he was wearing thicker socks and long overall the jeans when he cut his leg on a piece of steel.  He reports that the metal did not penetrate his pants or sock, but did cut him through the material.  He cleansed the area afterwards and placed a salve.  He is unsure when his last tetanus was.  He was concerned as he has seen the wound center for diabetic wounds previously and was concerned this is going to develop into one did not know when his last tetanus was..  Feels at his baseline health.  Laceration Associated symptoms: no fever        Prior to Admission medications   Medication Sig Start Date End Date Taking? Authorizing Provider  cephALEXin  (KEFLEX ) 500 MG capsule Take 1 capsule (500 mg total) by mouth 4 (four) times daily. 07/08/24  Yes Bernis Ernst, PA-C  apixaban  (ELIQUIS ) 5 MG TABS tablet Take 1 tablet (5 mg total) by mouth 2 (two) times daily. 05/26/24   Chandra Toribio POUR, MD  atorvastatin  (LIPITOR) 10 MG tablet Take 1 tablet (10 mg total) by mouth daily. 01/25/24   Wallace Joesph LABOR, PA  blood glucose meter kit and supplies KIT Dispense based on patient and insurance preference. 10/20/20   Abonza, Maritza, PA-C  Continuous Glucose Receiver (FREESTYLE LIBRE 3 READER) DEVI 1 each by Does not apply route daily. 12/17/23   Wallace Joesph LABOR, PA  Continuous Glucose Receiver (FREESTYLE LIBRE READER) DEVI 1 Device by Does not apply route daily. 12/11/23   Wallace Joesph LABOR, PA  Continuous Glucose Sensor (FREESTYLE LIBRE 14 DAY SENSOR) MISC See admin instructions 01/25/24   Wallace Joesph A, PA  Continuous Glucose Sensor (FREESTYLE LIBRE 3 SENSOR)  MISC 1 each by Does not apply route daily. Place 1 sensor on the skin every 14 days. Use to check glucose continuously 05/26/24   Chandra Toribio POUR, MD  empagliflozin  (JARDIANCE ) 10 MG TABS tablet Take 1 tablet (10 mg total) by mouth daily before breakfast. 05/26/24   Chandra Toribio POUR, MD  furosemide  (LASIX ) 20 MG tablet Take 1 tablet (20 mg total) by mouth as directed. Take 20 mg on  Mon., Wens., Friday. Take 40 mg Tues.,Thurs.,Sat., On Sunday Take 20 mg. 06/19/23   Jerilynn Lamarr HERO, NP  glipiZIDE  (GLUCOTROL ) 10 MG tablet Take 1 tablet (10 mg total) by mouth 2 (two) times daily before a meal. 01/25/24   Wallace Joesph A, PA  glucose blood (FREESTYLE LITE) test strip Use to check blood sugars every morning fasting 04/02/18   Danford, Rockie D, NP  lisinopril  (ZESTRIL ) 5 MG tablet Take 1 tablet by mouth once daily Patient not taking: Reported on 05/29/2024 03/10/24   Olson, Daniel K, MD  Multiple Vitamin (MULTIVITAMIN WITH MINERALS) TABS tablet Take 1 tablet by mouth daily. Men's One-A-Day Multivitamin Patient not taking: Reported on 05/29/2024    [provider]  mupirocin  ointment (BACTROBAN ) 2 % Apply 1 Application topically 2 (two) times daily. 05/26/24   Chandra Toribio POUR, MD  pregabalin  (LYRICA ) 100 MG capsule Take 1 capsule (100 mg total) by mouth 2 (two) times daily. 01/25/24  Wallace Search A, PA  Semaglutide  (RYBELSUS ) 14 MG TABS Take 1 tablet (14 mg total) by mouth daily. 05/26/24   Chandra Toribio POUR, MD  tamsulosin  (FLOMAX ) 0.4 MG CAPS capsule Take 1 capsule by mouth once daily 03/10/24   Chandra Toribio POUR, MD    Allergies: Bee venom and Penicillins    Review of Systems  Constitutional:  Negative for chills and fever.  Skin:  Positive for wound.    Updated Vital Signs BP 111/69 (BP Location: Right Arm)   Pulse 69   Temp 98.2 F (36.8 C)   Resp 15   SpO2 97%   Physical Exam Vitals and nursing note reviewed.  Constitutional:      General: He is not in acute distress.    Appearance: He is  not ill-appearing or toxic-appearing.  Eyes:     General: No scleral icterus. Cardiovascular:     Rate and Rhythm: Normal rate.  Pulmonary:     Effort: Pulmonary effort is normal. No respiratory distress.  Skin:    General: Skin is warm and dry.     Comments: Please see attached image.  Appears to be superficial.  I am able to lift back skin flap and is very superficial.  No adipose tissue or deeper tissue seen.  Hemostatic.  No foreign bodies noted.  Compartments are soft.  Palpable distal pulses.  Does have some tingling distally but at his baseline.  Slightly tender to palpation.  No red streaking noted.  No increase in warmth.  No purulent discharge.  Neurological:     Mental Status: He is alert.      (all labs ordered are listed, but only abnormal results are displayed) Labs Reviewed - No data to display  EKG: None  Radiology: No results found.  .Laceration Repair  Date/Time: 07/08/2024 12:20 PM  Performed by: Bernis Ernst, PA-C Authorized by: Bernis Ernst, PA-C   Consent:    Consent obtained:  Verbal   Consent given by:  Patient   Risks, benefits, and alternatives were discussed: yes     Risks discussed:  Infection, pain, need for additional repair, retained foreign body, poor cosmetic result and poor wound healing   Alternatives discussed:  No treatment Universal protocol:    Procedure explained and questions answered to patient or proxy's satisfaction: yes     Patient identity confirmed:  Verbally with patient Anesthesia:    Anesthesia method:  None Laceration details:    Location:  Leg   Leg location:  L lower leg   Length (cm):  7 Pre-procedure details:    Preparation:  Patient was prepped and draped in usual sterile fashion and imaging obtained to evaluate for foreign bodies Exploration:    Contaminated: no   Treatment:    Area cleansed with:  Saline   Amount of cleaning:  Extensive   Irrigation solution:  Sterile saline Skin repair:    Repair  method:  Steri-Strips   Number of Steri-Strips:  6 Approximation:    Approximation:  Loose Repair type:    Repair type:  Simple Post-procedure details:    Dressing:  Non-adherent dressing and bulky dressing   Procedure completion:  Tolerated well, no immediate complications    Medications Ordered in the ED  Tdap (BOOSTRIX ) injection 0.5 mL (0.5 mLs Intramuscular Given 07/08/24 1056)                               Medical Decision  Making Risk Prescription drug management.   73 y.o. male presents to the ER today for evaluation of skin tear. Differential diagnosis includes but is not limited to skin tear versus laceration. Vital signs unremarkable. Physical exam as noted above.   I do not think any x-ray imaging is needed given the patient is amatory without any difficulty.  Not having severe pain, he was mainly worried about wound infection.  I have very low concern for any foreign body given that I am able to see the wound depth as well as no actual metal contacted his skin and instead cut him through the clothing.  His last tetanus was updated in 2019, had a shared decision making with the patient on updating versus waiting.  Patient would like to go ahead and update his tetanus today.  Tdap ordered.  Again, wound is superficial, do not think this needs any suturing repair however I will Steri-Strip the wound.  Please see procedure note.  Bulky dressing was applied by nursing staff.  I discussed wound cleaning with the patient.  Will place him on prophylactic antibiotics.  Patient verbalizes understanding that he is at increased risk for infection and poor wound healing due to his diabetes.  He assures me that he will follow-up with his primary care provider soon and will call today to schedule an appointment as his wound will need further monitoring and evaluation.  We discussed strict return precautions of red flag symptoms.  Patient verbalizes understanding and agrees to the plan.  Patient is  stable being discharged home in good condition.  Portions of this report may have been transcribed using voice recognition software. Every effort was made to ensure accuracy; however, inadvertent computerized transcription errors may be present.    Final diagnoses:  Skin tear of left lower leg without complication, initial encounter    ED Discharge Orders          Ordered    cephALEXin  (KEFLEX ) 500 MG capsule  4 times daily        07/08/24 1208               Bernis Ernst, [place] 07/08/24 1237    Dasie Faden, MD 07/09/24 [phone]

## 2024-07-08 NOTE — ED Triage Notes (Signed)
 Pt caox4, ambulatory c/o lac to L lower leg stating it was cut from running into steel. Unknown last Tdap.

## 2024-07-10 ENCOUNTER — Ambulatory Visit: Payer: Self-pay | Admitting: *Deleted

## 2024-07-10 NOTE — Telephone Encounter (Signed)
 FYI Only or Action Required?: Action required by provider: referral request.  Patient was last seen in primary care on 05/26/2024 by Chandra Toribio POUR, MD.  Called Nurse Triage reporting Laceration.  Symptoms began yesterday.  Interventions attempted: Nothing.  Symptoms are: stable.  Triage Disposition: See PCP When Office is Open (Within 3 Days)  Patient/caregiver understands and will follow disposition?: YesPatient was seen at ED 7/15- laceration left lower leg. Wound cleaned and steri strips applied. Patient is concerned about the pain he is having and is requesting referral to wound care. Unable to schedule appointment with in disposition for patient- CAL has been informed and will send request for referral to office.  Reason for Disposition  [1] Diabetes mellitus AND [2] minor cut or scratch on foot    Patient has cut on leg- he is concerned about infection/pain- wants to see wound care center.  Answer Assessment - Initial Assessment Questions 1. APPEARANCE of INJURY: What does the injury look like?      Patient unable to see area 2. ONSET: How long ago did the injury occur?      07/08/24 3. LOCATION: Where is the injury located?      Left lower leg- side of shin 4. SIZE: How large is the cut?      2-3 inches 5. BLEEDING: Is it bleeding now? If Yes, ask: Is it difficult to stop?      Draining a little 6. PAIN: Is there any pain? If Yes, ask: How bad is the pain? (Scale 0-10; or none, mild, moderate, severe)     Pain all the time- more when walks 7. MECHANISM: Tell me how it happened.      Ran into Education officer, museum 8. TETANUS: When was your last tetanus booster?     Booster given  Protocols used: Cuts and Lacerations-A-AH   Copied from [employer] [phone]. Topic: Appointments - Scheduling Inquiry for Clinic >> Jul 10, 2024  9:42 AM Tiffini S wrote: Reason for CRM: Patient called to schedule hospital follow up for 07/16/24 with the PCP. Patient have cut on the  lower left leg- throbbing. Transferred to triage nurse.

## 2024-07-14 ENCOUNTER — Other Ambulatory Visit: Payer: Self-pay

## 2024-07-14 ENCOUNTER — Telehealth: Payer: Self-pay

## 2024-07-14 NOTE — Progress Notes (Signed)
   07/14/2024  Patient ID: [name] Limes, male   DOB: 09/25/1951, 73 y.o.   MRN: [id number]  Attempted to contact patient for scheduled appointment for medication management. Left HIPAA compliant message for patient to return my call at their convenience.    Future Appointments  Date Time Provider Department Center  07/16/2024  3:50 PM Chandra Toribio POUR, MD PCFO-PCFO None  09/02/2024  8:10 AM Chandra Toribio POUR, MD PCFO-PCFO None  03/24/2025  9:15 AM BUA-LAB BUA-BUA None  03/31/2025  9:40 AM Maurine Lukes, PA-C BUA-BUA None  07/02/2025  3:00 PM PCFO-ANNUAL WELLNESS VISIT PCFO-PCFO None   Lang Sieve, PharmD, BCGP Clinical Pharmacist  [phone]

## 2024-07-14 NOTE — Telephone Encounter (Signed)
 Will evaluate pt at his appt on Wednesday and discuss wound mgmt options.

## 2024-07-15 NOTE — Telephone Encounter (Signed)
 Reached out to Patient again for PAP application for jardiance  ([employer]) which has not been returned to our team. Left HIPAA compliant v/m if any assistance is needed.

## 2024-07-16 ENCOUNTER — Encounter: Payer: Self-pay | Admitting: Family Medicine

## 2024-07-16 ENCOUNTER — Ambulatory Visit (INDEPENDENT_AMBULATORY_CARE_PROVIDER_SITE_OTHER): Admitting: Family Medicine

## 2024-07-16 VITALS — BP 125/75 | HR 71 | Ht 70.5 in | Wt 234.0 lb

## 2024-07-16 DIAGNOSIS — E669 Obesity, unspecified: Secondary | ICD-10-CM

## 2024-07-16 DIAGNOSIS — Z7984 Long term (current) use of oral hypoglycemic drugs: Secondary | ICD-10-CM

## 2024-07-16 DIAGNOSIS — E1169 Type 2 diabetes mellitus with other specified complication: Secondary | ICD-10-CM

## 2024-07-16 DIAGNOSIS — S81812D Laceration without foreign body, left lower leg, subsequent encounter: Secondary | ICD-10-CM

## 2024-07-16 MED ORDER — LIDOCAINE-PRILOCAINE 2.5-2.5 % EX CREA: 1.0000 | TOPICAL_CREAM | CUTANEOUS | 2 refills | Status: AC | PRN

## 2024-07-16 NOTE — Progress Notes (Unsigned)
 Established Patient Office Visit  Subjective   Patient ID: [name], male    DOB: 04-18-1951  Age: 73 y.o. MRN: [id number]  Chief Complaint  Patient presents with   Hospitalization Follow-up    HPI  Subjective - Right lower leg wound follow-up. Seen in the ED on 07/08/2024 for this issue. Finished a course of antibiotics. Reports washing the wound daily with antibacterial soap, then rewrapping with non-stick gauze and regular gauze. Reports some bleeding on the gauze. Main issue is that it looks nasty. Denies other issues since the ED visit. Has a history of similar wounds on legs treated at a wound care center in the past. These were related to leg injuries, not diabetic foot ulcers. - Blood sugar management. Reports morning fasting blood sugars are between 100-150. Reports watching diet. A pharmacist has been trying to contact regarding medications.  Medications Completed a course of antibiotics prescribed on 07/08/2024. Uses an over-the-counter wound care paste, which has a Vaseline-like consistency, for dressing adherence.  PMH, PSH, FH, Social Hx PMH: Diabetes Mellitus. History of leg wounds requiring treatment at a wound care center. Social Hx: Lives alone.  ROS Constitutional: No new issues reported. Integumentary: Reports bleeding on gauze from leg wound. Denies signs of infection.   The ASCVD Risk score (Arnett DK, et al., 2019) failed to calculate for the following reasons:   Risk score cannot be calculated because patient has a medical history suggesting prior/existing ASCVD  Health Maintenance Due  Topic Date Due   Lung Cancer Screening  08/12/2023   COVID-19 Vaccine (4 - 2024-25 season) 08/26/2023   FOOT EXAM  04/17/2024      Objective:     BP 125/75   Pulse 71   Ht 5' 10.5 (1.791 m)   Wt 234 lb (106.1 kg)   SpO2 96%   BMI 33.10 kg/m    Physical Exam Gen: alert, oriented EXTREMITIES: left lower leg wound noted. Skin is tight.  Wound  appears to be healing with granulation tissue present. No signs of active infection noted.   No results found for any visits on 07/16/24.      Assessment & Plan:   Laceration of left lower leg, subsequent encounter Assessment & Plan: Patient presenting for follow-up of a left lower leg wound, seen in the ED on 07/08/2024. The wound is healing slowly, which is expected due to diabetes. Examination shows  no obvious signs of infection. Current wound care consists of daily washing with antibacterial soap and applying a non-stick dressing. - Continue current wound care of washing daily with antibacterial soap. - May apply a thin layer of Vaseline or continue current OTC healing ointment. - Prescribed lidocaine  2% gel to apply for pain as needed before dressing changes. - Provided an antibiotic cream to apply today, but advised it is not necessary for continued use. - Referral to be placed for Wound Care Center. - Counseled that the wound will heal from the bottom up and will take time due to diabetes and skin tension.  Orders: -     AMB referral to wound care center  Type 2 diabetes mellitus with obesity (HCC) Assessment & Plan: Reports good blood sugar control with fasting levels between 100-150 by watching diet. - Continue current diet and monitoring.     Other orders -     Lidocaine -Prilocaine ; Apply 1 Application topically as needed.  Dispense: 30 g; Refill: 2     Return in about 4 weeks (around 08/13/2024) for DM,  wound care f/u.    Toribio MARLA Slain, MD

## 2024-07-16 NOTE — Patient Instructions (Signed)
 It was nice to see you today,  We addressed the following topics today: -Continue using your nonadherent bandage and Ace wrap. - You can continue putting your ointment on it. - I have also prescribed a pain relieving cream containing lidocaine  that you can put on - I have sent a referral to the wound care center. - I will see you back in 1 month and we can reassess it.  Have a great day,  Rolan Slain, MD

## 2024-07-17 NOTE — Telephone Encounter (Signed)
 PAP: Patient assistance application for Rybelsus  has been approved by PAP Companies: [employer] from 08/11/2024 to 08/06/2025. Medication should be delivered to PAP Delivery: Provider's office. For further shipping updates, please contact [employer] at [phone]. Patient ID is: not provided

## 2024-07-17 NOTE — Telephone Encounter (Signed)
 Patient returned call and said he put the application in the mail today for Jardiance  ([employer]) and Eliquis  ([employer]).

## 2024-07-18 NOTE — Assessment & Plan Note (Addendum)
 Reports good blood sugar control with fasting levels between 100-150 by watching diet. - Continue current diet and monitoring.

## 2024-07-18 NOTE — Assessment & Plan Note (Signed)
 Patient presenting for follow-up of a left lower leg wound, seen in the ED on 07/08/2024. The wound is healing slowly, which is expected due to diabetes. Examination shows  no obvious signs of infection. Current wound care consists of daily washing with antibacterial soap and applying a non-stick dressing. - Continue current wound care of washing daily with antibacterial soap. - May apply a thin layer of Vaseline or continue current OTC healing ointment. - Prescribed lidocaine  2% gel to apply for pain as needed before dressing changes. - Provided an antibiotic cream to apply today, but advised it is not necessary for continued use. - Referral to be placed for Wound Care Center. - Counseled that the wound will heal from the bottom up and will take time due to diabetes and skin tension.

## 2024-07-21 NOTE — Telephone Encounter (Signed)
 PAP: Application for Eliquis has been submitted to [employer] ([employer]), via fax

## 2024-07-21 NOTE — Telephone Encounter (Signed)
 PAP: Application for Jardiance  has been submitted to [employer]), via fax with documents.

## 2024-07-22 ENCOUNTER — Encounter (HOSPITAL_BASED_OUTPATIENT_CLINIC_OR_DEPARTMENT_OTHER): Attending: Internal Medicine | Admitting: Internal Medicine

## 2024-07-22 DIAGNOSIS — L97828 Non-pressure chronic ulcer of other part of left lower leg with other specified severity: Secondary | ICD-10-CM | POA: Diagnosis not present

## 2024-07-22 DIAGNOSIS — E1151 Type 2 diabetes mellitus with diabetic peripheral angiopathy without gangrene: Secondary | ICD-10-CM | POA: Diagnosis not present

## 2024-07-22 DIAGNOSIS — I87322 Chronic venous hypertension (idiopathic) with inflammation of left lower extremity: Secondary | ICD-10-CM | POA: Insufficient documentation

## 2024-07-22 DIAGNOSIS — S80812D Abrasion, left lower leg, subsequent encounter: Secondary | ICD-10-CM | POA: Diagnosis present

## 2024-07-22 DIAGNOSIS — E11622 Type 2 diabetes mellitus with other skin ulcer: Secondary | ICD-10-CM | POA: Diagnosis not present

## 2024-07-22 DIAGNOSIS — X58XXXD Exposure to other specified factors, subsequent encounter: Secondary | ICD-10-CM | POA: Insufficient documentation

## 2024-07-23 NOTE — Telephone Encounter (Signed)
 Reached out to patient regarding denial letter from [employer]- he will send OOP document from Pharmacy and it will be forwarded to [employer] to see if he will qualify. Letter in Media tab.

## 2024-07-25 ENCOUNTER — Other Ambulatory Visit: Payer: Self-pay | Admitting: Family Medicine

## 2024-07-25 DIAGNOSIS — E1169 Type 2 diabetes mellitus with other specified complication: Secondary | ICD-10-CM

## 2024-07-29 ENCOUNTER — Encounter (HOSPITAL_BASED_OUTPATIENT_CLINIC_OR_DEPARTMENT_OTHER): Attending: Internal Medicine | Admitting: Internal Medicine

## 2024-07-29 DIAGNOSIS — E1151 Type 2 diabetes mellitus with diabetic peripheral angiopathy without gangrene: Secondary | ICD-10-CM | POA: Diagnosis not present

## 2024-07-29 DIAGNOSIS — I87322 Chronic venous hypertension (idiopathic) with inflammation of left lower extremity: Secondary | ICD-10-CM | POA: Insufficient documentation

## 2024-07-29 DIAGNOSIS — W228XXA Striking against or struck by other objects, initial encounter: Secondary | ICD-10-CM | POA: Diagnosis not present

## 2024-07-29 DIAGNOSIS — S80812A Abrasion, left lower leg, initial encounter: Secondary | ICD-10-CM | POA: Insufficient documentation

## 2024-07-29 DIAGNOSIS — S80812D Abrasion, left lower leg, subsequent encounter: Secondary | ICD-10-CM | POA: Diagnosis not present

## 2024-07-29 DIAGNOSIS — L97828 Non-pressure chronic ulcer of other part of left lower leg with other specified severity: Secondary | ICD-10-CM | POA: Diagnosis not present

## 2024-07-29 DIAGNOSIS — E11622 Type 2 diabetes mellitus with other skin ulcer: Secondary | ICD-10-CM | POA: Diagnosis not present

## 2024-07-29 NOTE — Progress Notes (Signed)
 Pharmacy Medication Assistance Program Note    07/29/2024  Patient ID: CURVIN HUNGER, male   DOB: [birth year], 73 y.o.   MRN: [id number]     07/29/2024  Outreach Medication One  Initial Outreach Date (Medication One) 06/04/2024  Manufacturer Medication One [employer]  [employer] [facility] Jardiance   Type of Radiographer, therapeutic Assistance  Date Application Sent to Prescriber 06/06/2024  Name of Prescriber Toribio Slain, MD  Date Application Received From Patient 07/17/2024  Application Items Received From Patient Application;Proof of Income  Date Application Received From Provider 06/25/2024  Date Application Submitted to Manufacturer 07/23/2024  Method Application Sent to Manufacturer Fax  Patient Assistance Determination Approved  Approval Start Date 07/24/2024  Approval End Date 12/24/2024  Patient Notification Method Telephone Call  Telephone Call Outcome Left Voicemail

## 2024-07-29 NOTE — Telephone Encounter (Signed)
 PAP: Patient assistance application for Jardiance  has been approved by PAP Companies: [employer] from 07/24/2024 to 12/24/2024. Medication should be delivered to PAP Delivery: Home. For further shipping updates, please contact [employer] ([employer]) at [phone]. Patient ID is: not provided Medication was processed and shipped from manufacturer on 07/25/24- should be arriving soon.

## 2024-08-04 ENCOUNTER — Other Ambulatory Visit: Payer: Self-pay | Admitting: Family Medicine

## 2024-08-04 DIAGNOSIS — E1142 Type 2 diabetes mellitus with diabetic polyneuropathy: Secondary | ICD-10-CM

## 2024-08-05 ENCOUNTER — Encounter (HOSPITAL_BASED_OUTPATIENT_CLINIC_OR_DEPARTMENT_OTHER): Admitting: Internal Medicine

## 2024-08-05 DIAGNOSIS — S80812A Abrasion, left lower leg, initial encounter: Secondary | ICD-10-CM | POA: Diagnosis not present

## 2024-08-13 ENCOUNTER — Encounter: Payer: Self-pay | Admitting: Family Medicine

## 2024-08-13 ENCOUNTER — Ambulatory Visit (INDEPENDENT_AMBULATORY_CARE_PROVIDER_SITE_OTHER): Admitting: Family Medicine

## 2024-08-13 VITALS — BP 101/66 | HR 63 | Ht 70.5 in | Wt 234.8 lb

## 2024-08-13 DIAGNOSIS — E1169 Type 2 diabetes mellitus with other specified complication: Secondary | ICD-10-CM

## 2024-08-13 DIAGNOSIS — R1319 Other dysphagia: Secondary | ICD-10-CM | POA: Insufficient documentation

## 2024-08-13 DIAGNOSIS — E669 Obesity, unspecified: Secondary | ICD-10-CM | POA: Diagnosis not present

## 2024-08-13 DIAGNOSIS — Z7984 Long term (current) use of oral hypoglycemic drugs: Secondary | ICD-10-CM | POA: Diagnosis not present

## 2024-08-13 LAB — POCT GLYCOSYLATED HEMOGLOBIN (HGB A1C): Hemoglobin A1C: 9 % — AB (ref 4.0–5.6)

## 2024-08-13 MED ORDER — EMPAGLIFLOZIN 25 MG PO TABS: 25.0000 mg | ORAL_TABLET | Freq: Every day | ORAL | 3 refills | Status: DC

## 2024-08-13 MED ORDER — FREESTYLE LIBRE 3 SENSOR MISC: 1.0000 | Freq: Every day | 3 refills | Status: AC

## 2024-08-13 NOTE — Telephone Encounter (Signed)
 PAP: Patient assistance application for Rybelsus  has been approved by PAP Companies: [employer] from 08/12/2024 to 08/06/2025. Medication should be delivered to PAP Delivery: Provider's office. For further shipping updates, please contact [employer] at [phone]. Patient ID is: [id number]

## 2024-08-13 NOTE — Assessment & Plan Note (Signed)
 Reports new onset dysphagia to solids, along with nocturnal abdominal pain relieved by belching. This may be related to GERD or medication side effects (e.g., Rybelsus  causing delayed gastric emptying). - Recommended trial of over-the-counter Pepcid  before bed. - Also discussed over-the-counter simethicone  for gas. - Discussed that definitive diagnosis would require an EGD with a gastroenterologist. - Patient wishes to monitor symptoms and will defer GI referral at this time. - Will reassess at next follow-up.

## 2024-08-13 NOTE — Assessment & Plan Note (Signed)
 History of elevated A1c, previously >10, 9.1 three months ago, and stable at that level since. Blood sugars remain labile, with hyperglycemia especially post-prandially after consuming sweets. Currently on Jardiance , glipizide , and Rybelsus . Patient reports some weight loss and decreased appetite on Rybelsus . - Increase Jardiance  to 25 mg once daily. - Continue glipizide  and Rybelsus  at current doses. - Send new prescription for [name]  sensors, 25-month supply (6 sensors). - Counseled on diet, specifically avoiding high-sugar foods like watermelon, peach cobbler, and cookies, as they counteract the effects of the medication. Advised that berries are a better fruit choice. - Advised to bring CGM monitor to the next appointment. - Follow up in 3 months.

## 2024-08-13 NOTE — Patient Instructions (Signed)
 It was nice to see you today,  We addressed the following topics today: - I have sent in a higher dose of your jardiance .  Take this instead of your current dose.  -I have sent in more freestyle libre sensors. - Please bring in your [place] monitor next time - You can take over-the-counter Pepcid  at night before bed to see if this helps with your gas. - If your swallowing issues persist we should have you go to the gastroenterologist.  Have a great day,  Rolan Slain, MD

## 2024-08-13 NOTE — Progress Notes (Signed)
 Established Patient Office Visit  Subjective   Patient ID: [name], male    DOB: [birth year]  Age: 73 y.o. MRN: [id number]  Chief Complaint  Patient presents with   Diabetes    HPI  Subjective - Follow-up on type 2 diabetes. Reports blood sugars are coming down with Rybelsus . Has a Freestyle Libre CGM, but the last sensor failed immediately after application. Reports only receiving three sensors per prescription. Experiences significant blood sugar fluctuations, with readings up to the 300s at night after eating dessert (e.g., peach cobbler, cookies) and morning readings around 115-140. Lowest reading reported was 80. Denies symptoms of significant hypoglycemia. Reports weight loss. Weight today 234 lbs, reports weight at home was 227 lbs.  - New complaint of dysphagia. Reports a sensation of food getting stuck in the chest, associated with a burning feeling. This occurs with both solids and liquids. Also reports episodes of waking at night with significant stomach pain, which is relieved by belching. This has been occurring for the last 3-4 months.  Medications Medications include Jardiance  10 mg daily, glipizide  twice daily (once before breakfast, sometimes once before supper), and Rybelsus  daily.  PMH, PSH, FH, Social Hx PMH: Type 2 Diabetes Mellitus. Social Hx: Reports a sweet tooth and a history of having dessert with meals.  ROS GI: Positive for dysphagia to solids and liquids, burning chest sensation with eating, nocturnal abdominal pain relieved by belching.    The ASCVD Risk score (Arnett DK, et al., 2019) failed to calculate for the following reasons:   Risk score cannot be calculated because patient has a medical history suggesting prior/existing ASCVD  Health Maintenance Due  Topic Date Due   Lung Cancer Screening  08/12/2023   COVID-19 Vaccine (4 - 2024-25 season) 08/26/2023   FOOT EXAM  04/17/2024   INFLUENZA VACCINE  07/25/2024      Objective:      BP 101/66   Pulse 63   Ht 5' 10.5 (1.791 m)   Wt 234 lb 12 oz (106.5 kg)   SpO2 98%   BMI 33.21 kg/m    Physical Exam General: No acute distress. CV: Regular rate and rhythm, no murmurs, rubs, or gallops. PULM: Lungs clear to auscultation bilaterally. Gi: soft, nbs.    Results for orders placed or performed in visit on 08/13/24  POCT HgB A1C  Result Value Ref Range   Hemoglobin A1C 9.0 (A) 4.0 - 5.6 %   HbA1c POC (<> result, manual entry)     HbA1c, POC (prediabetic range)     HbA1c, POC (controlled diabetic range)          Assessment & Plan:   Type 2 diabetes mellitus with obesity (HCC) Assessment & Plan: History of elevated A1c, previously >10, 9.1 three months ago, and stable at that level since. Blood sugars remain labile, with hyperglycemia especially post-prandially after consuming sweets. Currently on Jardiance , glipizide , and Rybelsus . Patient reports some weight loss and decreased appetite on Rybelsus . - Increase Jardiance  to 25 mg once daily. - Continue glipizide  and Rybelsus  at current doses. - Send new prescription for [name]  sensors, 65-month supply (6 sensors). - Counseled on diet, specifically avoiding high-sugar foods like watermelon, peach cobbler, and cookies, as they counteract the effects of the medication. Advised that berries are a better fruit choice. - Advised to bring CGM monitor to the next appointment. - Follow up in 3 months.  Orders: -     POCT glycosylated hemoglobin (Hb A1C) -  FreeStyle Libre 3 Sensor; 1 each by Does not apply route daily. Place 1 sensor on the skin every 14 days. Use to check glucose continuously  Dispense: 6 each; Refill: 3  Esophageal dysphagia Assessment & Plan: Reports new onset dysphagia to solids, along with nocturnal abdominal pain relieved by belching. This may be related to GERD or medication side effects (e.g., Rybelsus  causing delayed gastric emptying). - Recommended trial of over-the-counter  Pepcid  before bed. - Also discussed over-the-counter simethicone  for gas. - Discussed that definitive diagnosis would require an EGD with a gastroenterologist. - Patient wishes to monitor symptoms and will defer GI referral at this time. - Will reassess at next follow-up.   Other orders -     Empagliflozin ; Take 1 tablet (25 mg total) by mouth daily before breakfast.  Dispense: 30 tablet; Refill: 3     Return in about 3 months (around 11/13/2024) for DM.    Toribio MARLA Slain, MD

## 2024-08-14 ENCOUNTER — Encounter (HOSPITAL_BASED_OUTPATIENT_CLINIC_OR_DEPARTMENT_OTHER): Admitting: Internal Medicine

## 2024-08-14 DIAGNOSIS — S80812A Abrasion, left lower leg, initial encounter: Secondary | ICD-10-CM | POA: Diagnosis not present

## 2024-08-14 DIAGNOSIS — I87322 Chronic venous hypertension (idiopathic) with inflammation of left lower extremity: Secondary | ICD-10-CM

## 2024-08-14 DIAGNOSIS — L97828 Non-pressure chronic ulcer of other part of left lower leg with other specified severity: Secondary | ICD-10-CM

## 2024-08-14 DIAGNOSIS — E11622 Type 2 diabetes mellitus with other skin ulcer: Secondary | ICD-10-CM | POA: Diagnosis not present

## 2024-08-19 ENCOUNTER — Other Ambulatory Visit: Payer: Self-pay | Admitting: Family Medicine

## 2024-08-20 ENCOUNTER — Encounter (HOSPITAL_BASED_OUTPATIENT_CLINIC_OR_DEPARTMENT_OTHER): Admitting: General Surgery

## 2024-08-20 DIAGNOSIS — S80812A Abrasion, left lower leg, initial encounter: Secondary | ICD-10-CM | POA: Diagnosis not present

## 2024-08-21 ENCOUNTER — Encounter (HOSPITAL_BASED_OUTPATIENT_CLINIC_OR_DEPARTMENT_OTHER): Admitting: Internal Medicine

## 2024-08-21 LAB — HM DIABETES EYE EXAM

## 2024-08-22 NOTE — Telephone Encounter (Signed)
 Reached out again to patient and he has recently had some more expenses to add to account and will be mailing the total to the office soon.

## 2024-08-27 ENCOUNTER — Encounter (HOSPITAL_BASED_OUTPATIENT_CLINIC_OR_DEPARTMENT_OTHER): Admitting: General Surgery

## 2024-08-27 ENCOUNTER — Encounter (HOSPITAL_BASED_OUTPATIENT_CLINIC_OR_DEPARTMENT_OTHER): Attending: Internal Medicine | Admitting: Internal Medicine

## 2024-08-27 DIAGNOSIS — S80812D Abrasion, left lower leg, subsequent encounter: Secondary | ICD-10-CM | POA: Insufficient documentation

## 2024-08-27 DIAGNOSIS — X58XXXD Exposure to other specified factors, subsequent encounter: Secondary | ICD-10-CM | POA: Diagnosis not present

## 2024-08-27 DIAGNOSIS — I87322 Chronic venous hypertension (idiopathic) with inflammation of left lower extremity: Secondary | ICD-10-CM | POA: Insufficient documentation

## 2024-08-27 DIAGNOSIS — E1151 Type 2 diabetes mellitus with diabetic peripheral angiopathy without gangrene: Secondary | ICD-10-CM | POA: Diagnosis not present

## 2024-08-27 DIAGNOSIS — E11622 Type 2 diabetes mellitus with other skin ulcer: Secondary | ICD-10-CM | POA: Diagnosis not present

## 2024-08-27 DIAGNOSIS — L97828 Non-pressure chronic ulcer of other part of left lower leg with other specified severity: Secondary | ICD-10-CM | POA: Diagnosis not present

## 2024-09-02 ENCOUNTER — Encounter: Payer: Self-pay | Admitting: Family Medicine

## 2024-09-02 ENCOUNTER — Ambulatory Visit: Admitting: Family Medicine

## 2024-09-02 VITALS — BP 112/67 | HR 71 | Wt 235.0 lb

## 2024-09-02 DIAGNOSIS — Z7984 Long term (current) use of oral hypoglycemic drugs: Secondary | ICD-10-CM

## 2024-09-02 DIAGNOSIS — E782 Mixed hyperlipidemia: Secondary | ICD-10-CM

## 2024-09-02 DIAGNOSIS — E1169 Type 2 diabetes mellitus with other specified complication: Secondary | ICD-10-CM

## 2024-09-02 DIAGNOSIS — E669 Obesity, unspecified: Secondary | ICD-10-CM | POA: Diagnosis not present

## 2024-09-02 DIAGNOSIS — Z23 Encounter for immunization: Secondary | ICD-10-CM | POA: Diagnosis not present

## 2024-09-02 NOTE — Progress Notes (Unsigned)
   Established Patient Office Visit  Subjective   Patient ID: [name], male    DOB: [birth year]  Age: 73 y.o. MRN: [id number]  No chief complaint on file.   HPI  Subjective -Attended today due to a scheduling error, as a follow-up was already conducted two weeks prior. No new concerns. Brought Freestyle Libre reader for review. - Reports issues with Freestyle Libre sensors failing after only 2-3 days, not falling off but ceasing to function. Has had to replace them frequently. Current sensor was placed last week and is functioning well. - Diet: Usually has a sausage egg McMuffin and coffee for breakfast around 7 a.m. Lunch is variable, between 1-2 p.m. Supper is between 6-7:30 p.m. Biggest meal is lunch. Sometimes has blood sugars in the 300s when going to bed. - Has observed blood sugar readings as low as the 40s overnight.  Medications Jardiance  was increased two weeks ago, Rybelsus  taken in the morning, Glipizide  taken twice daily.  PMH, PSH, FH, Social Hx PMHx: Diabetes Mellitus Type 2.  ROS Eyes: Recent eye exam was normal, pressures were good.  Objective Freestyle Libre data reviewed. Readings show elevated glucose levels, particularly in the afternoon, with averages in the mid-200s.  Assessment and Plan Diabetes Mellitus, Type 2, poorly controlled - Glucose levels remain elevated, particularly in the afternoon, despite recent increase in Jardiance  two weeks ago. Patient also reports episodes of significant nocturnal hypoglycemia, with readings as low as 40. Current regimen includes Jardiance , Rybelsus , and glipizide  BID. Reports taking second dose of glipizide  late in the evening, sometimes at 9 p.m. - Continue current medications, including the increased Jardiance  dose. We will re-evaluate its efficacy in 3 months. - Counselled on glipizide  timing. Instructed to take the second dose no later than 6 p.m. and to take it with a meal (within 15 minutes before or after  starting to eat) to reduce the risk of nocturnal hypoglycemia. - Discussed that if glucose remains elevated at the next visit, we will consider switching from oral Rybelsus  to a once-weekly injectable GLP-1 agonist for better efficacy and titration. - If Freestyle Libre sensors continue to fail, advised to contact the manufacturer for replacements and to inform our office if refills are needed sooner. - Flu shot administered today. - Will defer foot exam until the scheduled November appointment.  Toribio Slain, MD

## 2024-09-02 NOTE — Patient Instructions (Signed)
 It was nice to see you today,  We addressed the following topics today: -I would like you to make sure that you take your glipizide  with meals, 15 minutes before or after breakfast and then supper. - We will see you back in November. - If you continue to have issues with the sensors let us  know or the manufacture to replace them.  Have a great day,  Rolan Slain, MD

## 2024-09-03 ENCOUNTER — Encounter (HOSPITAL_BASED_OUTPATIENT_CLINIC_OR_DEPARTMENT_OTHER): Admitting: Internal Medicine

## 2024-09-03 DIAGNOSIS — L97828 Non-pressure chronic ulcer of other part of left lower leg with other specified severity: Secondary | ICD-10-CM | POA: Diagnosis not present

## 2024-09-03 DIAGNOSIS — E11622 Type 2 diabetes mellitus with other skin ulcer: Secondary | ICD-10-CM | POA: Diagnosis not present

## 2024-09-03 DIAGNOSIS — I87322 Chronic venous hypertension (idiopathic) with inflammation of left lower extremity: Secondary | ICD-10-CM | POA: Diagnosis not present

## 2024-09-11 ENCOUNTER — Encounter (HOSPITAL_BASED_OUTPATIENT_CLINIC_OR_DEPARTMENT_OTHER): Admitting: Internal Medicine

## 2024-09-11 DIAGNOSIS — E11622 Type 2 diabetes mellitus with other skin ulcer: Secondary | ICD-10-CM

## 2024-09-11 DIAGNOSIS — I87322 Chronic venous hypertension (idiopathic) with inflammation of left lower extremity: Secondary | ICD-10-CM | POA: Diagnosis not present

## 2024-09-11 DIAGNOSIS — L97828 Non-pressure chronic ulcer of other part of left lower leg with other specified severity: Secondary | ICD-10-CM | POA: Diagnosis not present

## 2024-09-11 DIAGNOSIS — S80812D Abrasion, left lower leg, subsequent encounter: Secondary | ICD-10-CM | POA: Diagnosis not present

## 2024-10-07 ENCOUNTER — Telehealth: Payer: Self-pay | Admitting: *Deleted

## 2024-10-07 NOTE — Telephone Encounter (Signed)
 Contacted pt to inform him that we received his patient assistance medication and that he could come and pick it up.

## 2024-10-14 ENCOUNTER — Other Ambulatory Visit: Payer: Self-pay | Admitting: Family Medicine

## 2024-10-16 NOTE — Telephone Encounter (Signed)
 Patient never responded with any additional receipts for OOP.

## 2024-10-25 ENCOUNTER — Other Ambulatory Visit: Payer: Self-pay | Admitting: Family Medicine

## 2024-10-25 DIAGNOSIS — E669 Obesity, unspecified: Secondary | ICD-10-CM

## 2024-11-03 ENCOUNTER — Other Ambulatory Visit: Payer: Self-pay | Admitting: Family Medicine

## 2024-11-03 DIAGNOSIS — E1169 Type 2 diabetes mellitus with other specified complication: Secondary | ICD-10-CM

## 2024-11-12 ENCOUNTER — Telehealth: Payer: Self-pay

## 2024-11-12 NOTE — Telephone Encounter (Signed)
 PAP: Patient assistance application for Jardiance through [employer]) has been mailed to pt's home address on file. Provider portion of application will be faxed to provider's office.

## 2024-11-13 ENCOUNTER — Ambulatory Visit: Admitting: Family Medicine

## 2024-11-17 NOTE — Telephone Encounter (Signed)
 Provider portion faxed back and received confirmation 11/17/24

## 2024-11-26 NOTE — Telephone Encounter (Signed)
 PAP: Application for Bernadine has been submitted to [employer]), via fax

## 2024-12-04 ENCOUNTER — Other Ambulatory Visit: Payer: Self-pay

## 2024-12-04 DIAGNOSIS — E669 Obesity, unspecified: Secondary | ICD-10-CM

## 2024-12-10 ENCOUNTER — Encounter: Payer: Self-pay | Admitting: Podiatry

## 2024-12-10 ENCOUNTER — Ambulatory Visit: Admitting: Podiatry

## 2024-12-10 DIAGNOSIS — M79674 Pain in right toe(s): Secondary | ICD-10-CM | POA: Diagnosis not present

## 2024-12-10 DIAGNOSIS — B351 Tinea unguium: Secondary | ICD-10-CM

## 2024-12-10 DIAGNOSIS — M79675 Pain in left toe(s): Secondary | ICD-10-CM | POA: Diagnosis not present

## 2024-12-10 NOTE — Progress Notes (Signed)
 Subjective:   Patient ID: [name], male   DOB: 73 y.o.   MRN: [id number]   HPI Patient states that his nailbeds have really been bothering me and especially the left big toe he is worried that it could be infected   ROS      Objective:  Physical Exam  Neurovascular status unchanged with thick yellow brittle nailbeds 1-5 both feet localized no indication of advanced or active infection left     Assessment:  Chronic mycotic nail infection 1-5 both feet     Plan:  Debridement of nailbeds 1-5 both feet Neutra genic bleeding reappoint routine care

## 2024-12-16 ENCOUNTER — Other Ambulatory Visit (HOSPITAL_COMMUNITY): Payer: Self-pay

## 2024-12-16 ENCOUNTER — Other Ambulatory Visit: Payer: Self-pay | Admitting: Family Medicine

## 2024-12-16 DIAGNOSIS — R911 Solitary pulmonary nodule: Secondary | ICD-10-CM

## 2024-12-16 DIAGNOSIS — R918 Other nonspecific abnormal finding of lung field: Secondary | ICD-10-CM

## 2024-12-23 ENCOUNTER — Encounter: Payer: Self-pay | Admitting: Family Medicine

## 2024-12-23 ENCOUNTER — Ambulatory Visit (INDEPENDENT_AMBULATORY_CARE_PROVIDER_SITE_OTHER): Admitting: Family Medicine

## 2024-12-23 VITALS — BP 120/75 | HR 64 | Ht 70.5 in | Wt 228.4 lb

## 2024-12-23 DIAGNOSIS — E119 Type 2 diabetes mellitus without complications: Secondary | ICD-10-CM | POA: Diagnosis not present

## 2024-12-23 DIAGNOSIS — M62830 Muscle spasm of back: Secondary | ICD-10-CM | POA: Insufficient documentation

## 2024-12-23 DIAGNOSIS — R911 Solitary pulmonary nodule: Secondary | ICD-10-CM

## 2024-12-23 DIAGNOSIS — I5042 Chronic combined systolic (congestive) and diastolic (congestive) heart failure: Secondary | ICD-10-CM

## 2024-12-23 DIAGNOSIS — J432 Centrilobular emphysema: Secondary | ICD-10-CM

## 2024-12-23 DIAGNOSIS — E1169 Type 2 diabetes mellitus with other specified complication: Secondary | ICD-10-CM | POA: Diagnosis not present

## 2024-12-23 DIAGNOSIS — Z7984 Long term (current) use of oral hypoglycemic drugs: Secondary | ICD-10-CM | POA: Diagnosis not present

## 2024-12-23 DIAGNOSIS — E669 Obesity, unspecified: Secondary | ICD-10-CM

## 2024-12-23 DIAGNOSIS — I152 Hypertension secondary to endocrine disorders: Secondary | ICD-10-CM

## 2024-12-23 DIAGNOSIS — J438 Other emphysema: Secondary | ICD-10-CM

## 2024-12-23 DIAGNOSIS — E1159 Type 2 diabetes mellitus with other circulatory complications: Secondary | ICD-10-CM

## 2024-12-23 DIAGNOSIS — E782 Mixed hyperlipidemia: Secondary | ICD-10-CM | POA: Diagnosis not present

## 2024-12-23 LAB — POCT GLYCOSYLATED HEMOGLOBIN (HGB A1C): HbA1c POC (<> result, manual entry): 10.4 %

## 2024-12-23 MED ORDER — OZEMPIC (0.25 OR 0.5 MG/DOSE) 2 MG/3ML ~~LOC~~ SOPN: 0.2500 mg | PEN_INJECTOR | SUBCUTANEOUS | 0 refills | Status: AC

## 2024-12-23 NOTE — Assessment & Plan Note (Signed)
 Blood glucose elevated at 320 mg/dL postprandially. Current regimen includes Rybelsus , Jardiance , and glipizide . Discussed switching to Ozempic  for better glycemic control and weight loss. Explained Ozempic  side effects. - Discontinued Rybelsus . - Initiated Ozempic  injections. - Educated on Ozempic  injection technique. - Advised on dietary modifications to reduce sugar intake. - Monitor blood glucose levels regularly.

## 2024-12-23 NOTE — Assessment & Plan Note (Signed)
 Emphysema noted on imaging. Pt is a former smoker. Does not currently use inhaler

## 2024-12-23 NOTE — Progress Notes (Signed)
 "  Established Patient Office Visit  Subjective   Patient ID: [name], male    DOB: Nov 28, 1951  Age: 73 y.o. MRN: [id number]  Chief Complaint  Patient presents with   Medical Management of Chronic Issues     History of Present Illness   Derril Franek is a 73 year old male with diabetes who presents with elevated blood sugar levels and shoulder pain.  He has been experiencing elevated blood sugar levels, with a recent reading of 320 after dinner. He ran out of glipizide  last week and has not been taking it since. He usually takes it first thing in the morning before eating. He also takes Rybelsus  (semaglutide ) once a day and Jardiance  daily before breakfast. Despite these medications, his blood sugar levels remain high. His blood sugar was 90 before a shower and increased to 124 afterward. He mentions a recent cortisone shot in his shoulder.  He describes his diet, mentioning a recent meal of Caesar salad with dressing and barbecue ribs cooked with sugar-free Coke. He prefers 'a lot of salad dressing' and occasionally indulges in ice cream. He tries to manage his diet by choosing sugar-free or low-sugar options when possible.  He experiences shoulder pain that began on Christmas morning, describing it as a cramp in his back and shoulder blade area. The pain was severe enough to keep him in bed most of the day. He describes the pain as possibly being in the muscle and notes that it sometimes feels like a cramp. He uses Tylenol  and massages the area against a door jamb for relief. He has not noticed any lumps or nipple discharge.  He monitors his blood sugar regularly, noting a low reading of 50 one night, which he managed by drinking Coca-Cola. Typically, his nighttime blood sugar levels are in the 130s to 140s. He uses a blood sugar reader to track his levels.        The ASCVD Risk score (Arnett DK, et al., 2019) failed to calculate for the following reasons:   Risk score  cannot be calculated because patient has a medical history suggesting prior/existing ASCVD   * - Cholesterol units were assumed  Health Maintenance Due  Topic Date Due   Lung Cancer Screening  08/12/2023   FOOT EXAM  04/17/2024   COVID-19 Vaccine (4 - 2025-26 season) 08/25/2024      Objective:     BP 120/75   Pulse 64   Ht 5' 10.5 (1.791 m)   Wt 228 lb 6.4 oz (103.6 kg)   SpO2 97%   BMI 32.31 kg/m    Physical Exam   Gen: alert, oriented Pulm: no respiratory distress Psych: pleasant affect Msk: minimal tenderness medial to the right shoulder blade.  BREAST: No palpable breast lump.        Results for orders placed or performed in visit on 12/23/24  POCT glycosylated hemoglobin (Hb A1C)  Result Value Ref Range   Hemoglobin A1C     HbA1c POC (<> result, manual entry) 10.4 4.0 - 5.6 %   HbA1c, POC (prediabetic range)     HbA1c, POC (controlled diabetic range)          Assessment & Plan:   Type 2 diabetes mellitus in patient with obesity (HCC) Assessment & Plan: Blood glucose elevated at 320 mg/dL postprandially. Current regimen includes Rybelsus , Jardiance , and glipizide . Discussed switching to Ozempic  for better glycemic control and weight loss. Explained Ozempic  side effects. - Discontinued Rybelsus . - Initiated  Ozempic  injections. - Educated on Ozempic  injection technique. - Advised on dietary modifications to reduce sugar intake. - Monitor blood glucose levels regularly.  Orders: -     POCT glycosylated hemoglobin (Hb A1C)  Lung nodule Assessment & Plan: Due for follow-up CT scan to monitor pulmonary nodules. Previous PET and CT scans recommended six-month follow-up. - Ordered follow-up CT scan of the chest.   Orders: -     CT CHEST LUNG CANCER SCREENING LOW DOSE WO CONTRAST; Future  Spasm of thoracic back muscle Assessment & Plan: Soreness and cramping in shoulder and back, likely muscle spasm from nerve compression. Considered vertebral issues  and magnesium supplementation. Discussed physical therapy and muscle relaxants. - upcoming ct chest for pulmonary nodule may show any gross bony abnormalities if present.  - Recommended magnesium supplementation at night for muscle cramps. - Will consider physical therapy if symptoms persist.    Centrilobular emphysema (HCC)  Hypertension associated with diabetes (HCC) Assessment & Plan: At goal.  Continue lisinopril  daily.     Chronic combined systolic and diastolic CHF, NYHA class 2 (HCC) Assessment & Plan: Follows w/ cardiology yearly.  Euvolemic. Last echo showed ef 40-45%.  On jardiance , lasix , lisinopril    Mixed diabetic hyperlipidemia associated with type 2 diabetes mellitus (HCC) Assessment & Plan: Continue atorvastatin  10mg .     Paraseptal emphysema (HCC) Assessment & Plan: Emphysema noted on imaging. Pt is a former smoker. Does not currently use inhaler    Other orders -     Ozempic  (0.25 or 0.5 MG/DOSE); Inject 0.25 mg into the skin once a week.  Dispense: 3 mL; Refill: 0       Return in about 3 months (around 03/23/2025) for DM.    Toribio MARLA Slain, MD  "

## 2024-12-23 NOTE — Assessment & Plan Note (Signed)
 Continue atorvastatin 10 mg

## 2024-12-23 NOTE — Assessment & Plan Note (Signed)
 At goal  Continue lisinopril daily

## 2024-12-23 NOTE — Assessment & Plan Note (Signed)
 Due for follow-up CT scan to monitor pulmonary nodules. Previous PET and CT scans recommended six-month follow-up. - Ordered follow-up CT scan of the chest.

## 2024-12-23 NOTE — Patient Instructions (Signed)
" °  VISIT SUMMARY: During your visit, we addressed your elevated blood sugar levels and shoulder pain. We made changes to your diabetes medication and discussed options for managing your shoulder pain. We also planned a follow-up for your pulmonary nodule.  YOUR PLAN: TYPE 2 DIABETES MELLITUS WITH HYPERGLYCEMIA: Your blood sugar levels have been high, and you recently ran out of glipizide . -We have discontinued Rybelsus  and started you on Ozempic  injections. -We provided education on how to properly use Ozempic  injections. -Please monitor your blood sugar levels regularly. -Make dietary modifications to reduce sugar intake.  THORACIC AND SHOULDER MYALGIA: You have been experiencing soreness and cramping in your shoulder and back. -We ordered a CT scan of your chest to check for any vertebral issues. -We recommended taking magnesium supplements at night to help with muscle cramps. (400mg  magnesium oxide) -If your symptoms persist, we may consider physical therapy or muscle relaxants.  PULMONARY NODULE, FOLLOW-UP: You are due for a follow-up CT scan to monitor your pulmonary nodules. -We ordered a follow-up CT scan of your chest. - someone will call you to schedule this.       "

## 2024-12-23 NOTE — Assessment & Plan Note (Signed)
 Follows w/ cardiology yearly.  Euvolemic. Last echo showed ef 40-45%.  On jardiance , lasix , lisinopril 

## 2024-12-23 NOTE — Assessment & Plan Note (Addendum)
 Soreness and cramping in shoulder and back, likely muscle spasm from nerve compression. Considered vertebral issues and magnesium supplementation. Discussed physical therapy and muscle relaxants. - upcoming ct chest for pulmonary nodule may show any gross bony abnormalities if present.  - Recommended magnesium supplementation at night for muscle cramps. - Will consider physical therapy if symptoms persist.

## 2024-12-30 ENCOUNTER — Inpatient Hospital Stay: Admission: RE | Admit: 2024-12-30 | Discharge: 2024-12-30 | Attending: Family Medicine

## 2024-12-30 DIAGNOSIS — R911 Solitary pulmonary nodule: Secondary | ICD-10-CM

## 2025-01-02 ENCOUNTER — Telehealth: Payer: Self-pay

## 2025-01-02 ENCOUNTER — Ambulatory Visit: Payer: Self-pay

## 2025-01-02 NOTE — Telephone Encounter (Signed)
 Ct scan not yet read by radiology.  I will call the pt to discuss results once I see them.

## 2025-01-02 NOTE — Telephone Encounter (Signed)
 Copied from CRM [id number]. Topic: Clinical - Lab/Test Results >> Jan 02, 2025  9:33 AM Lonell PEDLAR wrote: Reason for CRM: Patient is requesting a call back to review recent CT scan. C/b: [phone]

## 2025-01-02 NOTE — Telephone Encounter (Signed)
 FYI Only or Action Required?: Action required by provider: update on patient condition.  Patient was last seen in primary care on 12/23/2024 by Chandra Toribio POUR, MD.  Called Nurse Triage reporting Breast Pain and Results.   Triage Disposition: See PCP Within 2 Weeks  Patient/caregiver understands and will follow disposition?: Yes   Copied from CRM #[id number]. Topic: Clinical - Red Word Triage >> Jan 02, 2025  2:50 PM Amy B wrote: Red Word that prompted transfer to Nurse Triage: Right breast pain    Reason for Disposition  [1] Breast pain AND [2] cause is not known  Answer Assessment - Initial Assessment Questions PT contacted clinic to f/u on CT results. Per my chart, Dr. Chandra notated today that radiology has not read results at this time but he will f/u with pt once exam read. Pt notified, voiced understanding. Pt states he is taking tylenol  for pain; reports pain feels like a stab 7x a day but does come and go. Encouraged heat application. He voiced appreciation and available via phone once results finalized.     1. SYMPTOM: What's the main symptom you're concerned about?  (e.g., lump, nipple discharge, pain, rash)     R pec to axilla pain   2. LOCATION: Where is the symptoms located?     Pec to axilla on R side   3. ONSET: When did symptoms  start?     Christmas morning, 12/25; pt has seen provider and waiting on CT results  Protocols used: Breast Symptoms-A-AH

## 2025-01-05 NOTE — Telephone Encounter (Signed)
 Called pt he is advised

## 2025-01-05 NOTE — Telephone Encounter (Signed)
 PCP is advised

## 2025-01-07 ENCOUNTER — Telehealth: Payer: Self-pay

## 2025-01-07 ENCOUNTER — Other Ambulatory Visit: Payer: Self-pay | Admitting: Family Medicine

## 2025-01-07 NOTE — Telephone Encounter (Signed)
 I spoke with the pt regarding his CT scan and he would like a referral to Dr. Kerrin, whom he saw in 2023.

## 2025-01-07 NOTE — Telephone Encounter (Signed)
 Copied from CRM [id number]. Topic: Clinical - Lab/Test Results >> Jan 07, 2025  9:44 AM Montie POUR wrote: Reason for CRM:  Annabella is calling from [facility] Radiology call back number [phone]. She wants Dr. Chandra to know the results are in Mr. Kallenbach chart for his CT Chest Lung Screen. Please call Tiffany with any questions. Thanks

## 2025-01-09 ENCOUNTER — Other Ambulatory Visit: Payer: Self-pay | Admitting: Thoracic Surgery (Cardiothoracic Vascular Surgery)

## 2025-01-09 ENCOUNTER — Ambulatory Visit: Payer: Self-pay | Admitting: Family Medicine

## 2025-01-09 DIAGNOSIS — R911 Solitary pulmonary nodule: Secondary | ICD-10-CM

## 2025-01-13 ENCOUNTER — Other Ambulatory Visit: Payer: Self-pay | Admitting: Thoracic Surgery (Cardiothoracic Vascular Surgery)

## 2025-01-13 DIAGNOSIS — R911 Solitary pulmonary nodule: Secondary | ICD-10-CM

## 2025-01-13 DIAGNOSIS — Z77018 Contact with and (suspected) exposure to other hazardous metals: Secondary | ICD-10-CM

## 2025-01-14 ENCOUNTER — Other Ambulatory Visit: Payer: Self-pay | Admitting: Family Medicine

## 2025-01-15 ENCOUNTER — Ambulatory Visit (HOSPITAL_COMMUNITY)
Admission: RE | Admit: 2025-01-15 | Discharge: 2025-01-15 | Disposition: A | Discharge status: Home / Self Care | Source: Ambulatory Visit | Attending: Thoracic Surgery (Cardiothoracic Vascular Surgery) | Admitting: Thoracic Surgery (Cardiothoracic Vascular Surgery)

## 2025-01-15 DIAGNOSIS — R911 Solitary pulmonary nodule: Secondary | ICD-10-CM | POA: Diagnosis present

## 2025-01-15 DIAGNOSIS — J988 Other specified respiratory disorders: Secondary | ICD-10-CM | POA: Diagnosis not present

## 2025-01-15 DIAGNOSIS — Z87891 Personal history of nicotine dependence: Secondary | ICD-10-CM | POA: Insufficient documentation

## 2025-01-15 LAB — PULMONARY FUNCTION TEST
DL/VA % pred: 72 %
DL/VA: 2.89 ml/min/mmHg/L
DLCO unc % pred: 58 %
DLCO unc: 15.37 ml/min/mmHg
FEF 25-75 Post: 1.7 L/s
FEF 25-75 Pre: 1.4 L/s
FEF2575-%Change-Post: 21 %
FEF2575-%Pred-Post: 68 %
FEF2575-%Pred-Pre: 56 %
FEV1-%Change-Post: 4 %
FEV1-%Pred-Post: 71 %
FEV1-%Pred-Pre: 68 %
FEV1-Post: 2.36 L
FEV1-Pre: 2.25 L
FEV1FVC-%Change-Post: 2 %
FEV1FVC-%Pred-Pre: 97 %
FEV6-%Change-Post: 2 %
FEV6-%Pred-Post: 74 %
FEV6-%Pred-Pre: 72 %
FEV6-Post: 3.19 L
FEV6-Pre: 3.1 L
FEV6FVC-%Change-Post: 0 %
FEV6FVC-%Pred-Post: 104 %
FEV6FVC-%Pred-Pre: 103 %
FVC-%Change-Post: 2 %
FVC-%Pred-Post: 71 %
FVC-%Pred-Pre: 70 %
FVC-Post: 3.24 L
FVC-Pre: 3.17 L
Post FEV1/FVC ratio: 73 %
Post FEV6/FVC ratio: 98 %
Pre FEV1/FVC ratio: 71 %
Pre FEV6/FVC Ratio: 98 %
RV % pred: 55 %
RV: 1.41 L
TLC % pred: 62 %
TLC: 4.56 L

## 2025-01-15 MED ORDER — ALBUTEROL SULFATE (2.5 MG/3ML) 0.083% IN NEBU
2.5000 mg | INHALATION_SOLUTION | Freq: Once | RESPIRATORY_TRACT | Status: AC
  Administered 2025-01-15: 2.5 mg via RESPIRATORY_TRACT

## 2025-01-20 ENCOUNTER — Ambulatory Visit (HOSPITAL_COMMUNITY)
Admission: RE | Admit: 2025-01-20 | Discharge: 2025-01-20 | Disposition: A | Discharge status: Home / Self Care | Source: Ambulatory Visit | Attending: Thoracic Surgery (Cardiothoracic Vascular Surgery) | Admitting: Thoracic Surgery (Cardiothoracic Vascular Surgery)

## 2025-01-20 DIAGNOSIS — R911 Solitary pulmonary nodule: Secondary | ICD-10-CM | POA: Diagnosis present

## 2025-01-20 LAB — GLUCOSE, CAPILLARY: Glucose-Capillary: 195 mg/dL — ABNORMAL HIGH (ref 70–99)

## 2025-01-20 MED ORDER — FLUDEOXYGLUCOSE F - 18 (FDG) INJECTION
11.4000 | Freq: Once | INTRAVENOUS | Status: AC
  Administered 2025-01-20: 11.82 via INTRAVENOUS

## 2025-01-21 ENCOUNTER — Ambulatory Visit
Admission: RE | Admit: 2025-01-21 | Discharge: 2025-01-21 | Disposition: A | Source: Ambulatory Visit | Attending: Thoracic Surgery (Cardiothoracic Vascular Surgery) | Admitting: Thoracic Surgery (Cardiothoracic Vascular Surgery)

## 2025-01-21 DIAGNOSIS — R911 Solitary pulmonary nodule: Secondary | ICD-10-CM

## 2025-01-21 DIAGNOSIS — Z77018 Contact with and (suspected) exposure to other hazardous metals: Secondary | ICD-10-CM

## 2025-01-21 MED ORDER — GADOPICLENOL 0.5 MMOL/ML IV SOLN
10.0000 mL | Freq: Once | INTRAVENOUS | Status: AC | PRN
  Administered 2025-01-21: 10 mL via INTRAVENOUS

## 2025-01-26 ENCOUNTER — Ambulatory Visit: Admitting: Thoracic Surgery (Cardiothoracic Vascular Surgery)

## 2025-01-27 ENCOUNTER — Ambulatory Visit: Admitting: Thoracic Surgery (Cardiothoracic Vascular Surgery)

## 2025-01-27 ENCOUNTER — Encounter: Payer: Self-pay | Admitting: *Deleted

## 2025-01-27 ENCOUNTER — Other Ambulatory Visit: Payer: Self-pay | Admitting: *Deleted

## 2025-01-27 ENCOUNTER — Encounter: Payer: Self-pay | Admitting: Thoracic Surgery (Cardiothoracic Vascular Surgery)

## 2025-01-27 VITALS — BP 110/71 | HR 96 | Resp 20 | Ht 70.5 in | Wt 216.0 lb

## 2025-01-27 DIAGNOSIS — R590 Localized enlarged lymph nodes: Secondary | ICD-10-CM

## 2025-01-27 DIAGNOSIS — R911 Solitary pulmonary nodule: Secondary | ICD-10-CM

## 2025-01-27 MED ORDER — OXYCODONE HCL 5 MG PO TABS: 5.0000 mg | ORAL_TABLET | Freq: Four times a day (QID) | ORAL | 0 refills | Status: AC | PRN

## 2025-01-28 ENCOUNTER — Encounter: Payer: Self-pay | Admitting: Cardiovascular Disease

## 2025-01-28 NOTE — Telephone Encounter (Signed)
 Can we please see if he can get Xarelto assistance or if dabigatran would be a more affordable alternative? Katie, any day I can squeeze him in the week of Feb 16? (New or acute slot OK).

## 2025-01-29 ENCOUNTER — Telehealth: Payer: Self-pay

## 2025-01-29 NOTE — Telephone Encounter (Signed)
 Thoracic Tumor Board Review  [name] was reviewed on the on the Thoracic Tumor Board on 01/29/2025.  [name] has a personal history of prostate cancer in 2012 and lung cancer.[name] meets National Comprehensive Cancer Network (NCCN) criteria for genetic testing based on his family history of pancreatic cancer in his father.   Marcus Fryer, MS, CGC  Certified Genetic Counselor  Email: [name]  Phone: [phone]

## 2025-02-03 ENCOUNTER — Other Ambulatory Visit (HOSPITAL_COMMUNITY)

## 2025-02-05 ENCOUNTER — Encounter (HOSPITAL_COMMUNITY): Admission: RE | Payer: Self-pay

## 2025-02-05 ENCOUNTER — Ambulatory Visit (HOSPITAL_COMMUNITY): Admission: RE | Admit: 2025-02-05 | Admitting: Thoracic Surgery (Cardiothoracic Vascular Surgery)

## 2025-02-10 ENCOUNTER — Encounter: Admitting: Thoracic Surgery (Cardiothoracic Vascular Surgery)

## 2025-02-11 ENCOUNTER — Ambulatory Visit: Admitting: Cardiovascular Disease

## 2025-03-24 ENCOUNTER — Other Ambulatory Visit

## 2025-03-25 ENCOUNTER — Other Ambulatory Visit

## 2025-03-30 ENCOUNTER — Ambulatory Visit: Admitting: Family Medicine

## 2025-03-31 ENCOUNTER — Ambulatory Visit: Admitting: Physician Assistant

## 2025-07-02 ENCOUNTER — Ambulatory Visit
